# Patient Record
Sex: Female | Born: 1970 | Race: White | Hispanic: No | Marital: Single | Smoking: Current every day smoker
Health system: Southern US, Community
[De-identification: ages and names within clinical notes are randomized; demographics above are authoritative.]

## PROBLEM LIST (undated history)

## (undated) DIAGNOSIS — G4730 Sleep apnea, unspecified: Secondary | ICD-10-CM

## (undated) DIAGNOSIS — I471 Supraventricular tachycardia, unspecified: Secondary | ICD-10-CM

## (undated) DIAGNOSIS — F419 Anxiety disorder, unspecified: Secondary | ICD-10-CM

## (undated) DIAGNOSIS — K439 Ventral hernia without obstruction or gangrene: Secondary | ICD-10-CM

## (undated) DIAGNOSIS — K219 Gastro-esophageal reflux disease without esophagitis: Secondary | ICD-10-CM

## (undated) DIAGNOSIS — I499 Cardiac arrhythmia, unspecified: Secondary | ICD-10-CM

## (undated) DIAGNOSIS — Z8719 Personal history of other diseases of the digestive system: Secondary | ICD-10-CM

## (undated) DIAGNOSIS — I10 Essential (primary) hypertension: Secondary | ICD-10-CM

## (undated) HISTORY — DX: Ventral hernia without obstruction or gangrene: K43.9

## (undated) HISTORY — PX: OTHER SURGICAL HISTORY: SHX169

## (undated) HISTORY — DX: Essential (primary) hypertension: I10

## (undated) HISTORY — DX: Gastro-esophageal reflux disease without esophagitis: K21.9

## (undated) HISTORY — PX: FINGER FRACTURE SURGERY: SHX638

## (undated) HISTORY — PX: FRACTURE SURGERY: SHX138

## (undated) HISTORY — DX: Anxiety disorder, unspecified: F41.9

---

## 2014-12-14 DIAGNOSIS — Z765 Malingerer [conscious simulation]: Secondary | ICD-10-CM | POA: Insufficient documentation

## 2014-12-15 DIAGNOSIS — F4323 Adjustment disorder with mixed anxiety and depressed mood: Secondary | ICD-10-CM | POA: Insufficient documentation

## 2020-08-14 ENCOUNTER — Encounter (HOSPITAL_COMMUNITY): Payer: Self-pay | Admitting: *Deleted

## 2020-08-14 ENCOUNTER — Emergency Department (HOSPITAL_COMMUNITY)
Admission: EM | Admit: 2020-08-14 | Discharge: 2020-08-14 | Disposition: A | Discharge status: Home / Self Care | Payer: Self-pay | Attending: Emergency Medicine | Admitting: Emergency Medicine

## 2020-08-14 ENCOUNTER — Emergency Department (HOSPITAL_COMMUNITY): Payer: Self-pay

## 2020-08-14 ENCOUNTER — Other Ambulatory Visit: Payer: Self-pay

## 2020-08-14 DIAGNOSIS — E871 Hypo-osmolality and hyponatremia: Secondary | ICD-10-CM | POA: Insufficient documentation

## 2020-08-14 DIAGNOSIS — K7030 Alcoholic cirrhosis of liver without ascites: Secondary | ICD-10-CM | POA: Insufficient documentation

## 2020-08-14 DIAGNOSIS — K529 Noninfective gastroenteritis and colitis, unspecified: Secondary | ICD-10-CM | POA: Insufficient documentation

## 2020-08-14 DIAGNOSIS — F17210 Nicotine dependence, cigarettes, uncomplicated: Secondary | ICD-10-CM | POA: Insufficient documentation

## 2020-08-14 DIAGNOSIS — Y906 Blood alcohol level of 120-199 mg/100 ml: Secondary | ICD-10-CM | POA: Insufficient documentation

## 2020-08-14 DIAGNOSIS — Z79899 Other long term (current) drug therapy: Secondary | ICD-10-CM | POA: Insufficient documentation

## 2020-08-14 DIAGNOSIS — Z20822 Contact with and (suspected) exposure to covid-19: Secondary | ICD-10-CM | POA: Insufficient documentation

## 2020-08-14 HISTORY — DX: Supraventricular tachycardia: I47.1

## 2020-08-14 HISTORY — DX: Supraventricular tachycardia, unspecified: I47.10

## 2020-08-14 LAB — CBC WITH DIFFERENTIAL/PLATELET
Abs Immature Granulocytes: 0.02 10*3/uL (ref 0.00–0.07)
Basophils Absolute: 0 10*3/uL (ref 0.0–0.1)
Basophils Relative: 1 %
Eosinophils Absolute: 0.2 10*3/uL (ref 0.0–0.5)
Eosinophils Relative: 4 %
HCT: 33.9 % — ABNORMAL LOW (ref 36.0–46.0)
Hemoglobin: 12.1 g/dL (ref 12.0–15.0)
Immature Granulocytes: 0 %
Lymphocytes Relative: 19 %
Lymphs Abs: 0.9 10*3/uL (ref 0.7–4.0)
MCH: 34.2 pg — ABNORMAL HIGH (ref 26.0–34.0)
MCHC: 35.7 g/dL (ref 30.0–36.0)
MCV: 95.8 fL (ref 80.0–100.0)
Monocytes Absolute: 0.7 10*3/uL (ref 0.1–1.0)
Monocytes Relative: 13 %
Neutro Abs: 3.1 10*3/uL (ref 1.7–7.7)
Neutrophils Relative %: 63 %
Platelets: 122 10*3/uL — ABNORMAL LOW (ref 150–400)
RBC: 3.54 MIL/uL — ABNORMAL LOW (ref 3.87–5.11)
RDW: 12.8 % (ref 11.5–15.5)
WBC: 4.9 10*3/uL (ref 4.0–10.5)
nRBC: 0 % (ref 0.0–0.2)

## 2020-08-14 LAB — COMPREHENSIVE METABOLIC PANEL
ALT: 47 U/L — ABNORMAL HIGH (ref 0–44)
AST: 138 U/L — ABNORMAL HIGH (ref 15–41)
Albumin: 3.1 g/dL — ABNORMAL LOW (ref 3.5–5.0)
Alkaline Phosphatase: 80 U/L (ref 38–126)
Anion gap: 10 (ref 5–15)
BUN: <5 mg/dL — ABNORMAL LOW (ref 6–20)
CO2: 24 mmol/L (ref 22–32)
Calcium: 8 mg/dL — ABNORMAL LOW (ref 8.9–10.3)
Chloride: 89 mmol/L — ABNORMAL LOW (ref 98–111)
Creatinine, Ser: 0.3 mg/dL — ABNORMAL LOW (ref 0.44–1.00)
GFR, Estimated: >60 mL/min (ref >60)
Glucose, Bld: 89 mg/dL (ref 70–99)
Potassium: 3.3 mmol/L — ABNORMAL LOW (ref 3.5–5.1)
Sodium: 123 mmol/L — ABNORMAL LOW (ref 135–145)
Total Bilirubin: 0.8 mg/dL (ref 0.3–1.2)
Total Protein: 7.7 g/dL (ref 6.5–8.1)

## 2020-08-14 LAB — RESP PANEL BY RT-PCR (FLU A&B, COVID) ARPGX2
Influenza A by PCR: NEGATIVE
Influenza B by PCR: NEGATIVE
SARS Coronavirus 2 by RT PCR: NEGATIVE

## 2020-08-14 LAB — URINALYSIS, ROUTINE W REFLEX MICROSCOPIC
Bilirubin Urine: NEGATIVE
Glucose, UA: NEGATIVE mg/dL
Hgb urine dipstick: NEGATIVE
Ketones, ur: NEGATIVE mg/dL
Leukocytes,Ua: NEGATIVE
Nitrite: NEGATIVE
Protein, ur: NEGATIVE mg/dL
Specific Gravity, Urine: 1.003 — ABNORMAL LOW (ref 1.005–1.030)
pH: 6 (ref 5.0–8.0)

## 2020-08-14 LAB — PREGNANCY, URINE: Preg Test, Ur: NEGATIVE

## 2020-08-14 LAB — ETHANOL: Alcohol, Ethyl (B): 192 mg/dL — ABNORMAL HIGH (ref <10)

## 2020-08-14 LAB — LIPASE, BLOOD: Lipase: 72 U/L — ABNORMAL HIGH (ref 11–51)

## 2020-08-14 MED ORDER — IOHEXOL 300 MG/ML  SOLN
75.0000 mL | Freq: Once | INTRAMUSCULAR | Status: AC | PRN
  Administered 2020-08-14: 75 mL via INTRAVENOUS

## 2020-08-14 MED ORDER — ONDANSETRON HCL 4 MG/2ML IJ SOLN
4.0000 mg | INTRAMUSCULAR | Status: AC
  Administered 2020-08-14: 4 mg via INTRAVENOUS
  Filled 2020-08-14 (×2): qty 2

## 2020-08-14 MED ORDER — SODIUM CHLORIDE 0.9 % IV SOLN: INTRAVENOUS | Status: DC

## 2020-08-14 MED ORDER — IOHEXOL 9 MG/ML PO SOLN
ORAL | Status: AC
  Filled 2020-08-14: qty 1000

## 2020-08-14 MED ORDER — METRONIDAZOLE 500 MG PO TABS
500.0000 mg | ORAL_TABLET | Freq: Once | ORAL | Status: AC
  Administered 2020-08-14: 500 mg via ORAL
  Filled 2020-08-14: qty 1

## 2020-08-14 MED ORDER — MORPHINE SULFATE (PF) 4 MG/ML IV SOLN
4.0000 mg | Freq: Once | INTRAVENOUS | Status: AC
  Administered 2020-08-14: 4 mg via INTRAVENOUS
  Filled 2020-08-14 (×2): qty 1

## 2020-08-14 MED ORDER — METRONIDAZOLE 500 MG PO TABS: 500.0000 mg | ORAL_TABLET | Freq: Two times a day (BID) | ORAL | 0 refills | Status: DC

## 2020-08-14 MED ORDER — CIPROFLOXACIN HCL 500 MG PO TABS: 500.0000 mg | ORAL_TABLET | Freq: Two times a day (BID) | ORAL | 0 refills | Status: DC

## 2020-08-14 MED ORDER — THIAMINE HCL 100 MG PO TABS
100.0000 mg | ORAL_TABLET | Freq: Once | ORAL | Status: AC
  Administered 2020-08-14: 100 mg via ORAL
  Filled 2020-08-14: qty 1

## 2020-08-14 MED ORDER — CIPROFLOXACIN HCL 250 MG PO TABS
500.0000 mg | ORAL_TABLET | Freq: Once | ORAL | Status: AC
  Administered 2020-08-14: 500 mg via ORAL
  Filled 2020-08-14: qty 2

## 2020-08-14 NOTE — ED Triage Notes (Signed)
Pt with abd distention for past 7 weeks, firm and knot as well.  Poor appetite. +N/V/D x 7 weeks as well. Denies any abd pain.

## 2020-08-14 NOTE — ED Provider Notes (Signed)
[facility] EMERGENCY DEPARTMENT Provider Note   CSN: [id number] Arrival date & time: 08/14/20  1146     History Chief Complaint  Patient presents with   abd distended    Joan Wood is a 50 y.o. female.  HPI  This patient is a 50 year old female, she denies chronic medical history other than stating that she drinks alcohol approximately 1 sixpack of beer per day.  The patient reports to me that when she was in her 60s she was a heavy drinker and considers herself an alcoholic, she even checked her self involuntarily to a detox facility and rehab, she was able to stop for quite some time but started drinking again, she was sober for several years until about 3 years ago when she broke up with her boyfriend and started drinking again, she drinks approximately 6 beers per day now.  She moved here in January hoping to get a fresh start with her life, she has been doing well until about 7 weeks ago when she woke up 1 morning feeling like there was a small lump in her mid to lower abdomen.  This is in the midline, over the next 7-week she has felt fairly chronically and constantly distended with some progressive tenderness mostly on the right side of her abdomen.  She has no nausea but has been reporting some loose to watery stools which has been also pretty constant for the last 7 weeks.  She denies any weight loss, she does feel a progressive shortness of breath because she feels like her abdomen is pushing up on her chest.  No fevers no chills no swelling of the legs, no rashes, no dysuria though she does have urinary frequency urinating approximately once an hour through the night.  The symptoms are persistent and gradually worsening.  She has not seen a doctor she has no insurance she takes no daily medicines and has not been diagnosed with any chronic medical conditions otherwise.  Past Medical History:  Diagnosis Date   SVT (supraventricular tachycardia) (HCC)     There are no problems  to display for this patient.   History reviewed. No pertinent surgical history.   OB History   No obstetric history on file.     History reviewed. No pertinent family history.  Social History   Tobacco Use   Smoking status: Every Day    Packs/day: 0.50    Types: Cigarettes   Smokeless tobacco: Never  Substance Use Topics   Alcohol use: Yes    Comment: beer, 4-6 /day   Drug use: Never    Home Medications Prior to Admission medications   Medication Sig Start Date End Date Taking? Authorizing Provider  ciprofloxacin (CIPRO) 500 MG tablet Take 1 tablet (500 mg total) by mouth 2 (two) times daily. 08/14/20  Yes Eber Hong, MD  metroNIDAZOLE (FLAGYL) 500 MG tablet Take 1 tablet (500 mg total) by mouth 2 (two) times daily. 08/14/20  Yes Eber Hong, MD    Allergies    Bee venom  Review of Systems   Review of Systems  All other systems reviewed and are negative.  Physical Exam Updated Vital Signs BP 113/80 (BP Location: Left Arm)   Pulse 90   Temp 99.1 F (37.3 C) (Oral)   Resp 20   Ht 1.549 m (5\' 1" )   Wt 50.8 kg   SpO2 100%   BMI 21.16 kg/m   Physical Exam Vitals and nursing note reviewed.  Constitutional:  General: She is not in acute distress.    Appearance: She is well-developed.  HENT:     Head: Normocephalic and atraumatic.     Mouth/Throat:     Mouth: Mucous membranes are moist.     Pharynx: No oropharyngeal exudate.  Eyes:     General: No scleral icterus.       Right eye: No discharge.        Left eye: No discharge.     Conjunctiva/sclera: Conjunctivae normal.     Pupils: Pupils are equal, round, and reactive to light.     Comments: No jaundice  Neck:     Thyroid: No thyromegaly.     Vascular: No JVD.  Cardiovascular:     Rate and Rhythm: Normal rate and regular rhythm.     Heart sounds: Normal heart sounds. No murmur heard.   No friction rub. No gallop.  Pulmonary:     Effort: Pulmonary effort is normal. No respiratory distress.      Breath sounds: Normal breath sounds. No wheezing or rales.  Abdominal:     General: Bowel sounds are normal. There is distension.     Palpations: Abdomen is soft. There is no mass.     Tenderness: There is abdominal tenderness.     Comments: The right side of the abdomen feels full and somewhat solid especially in the mid abdomen, she has tenderness on the side, she has no tenderness or masses in the left side of the abdomen.  She has what appears to be a small periumbilical hernia that is worse when she bears down but easily reducible  Musculoskeletal:        General: No tenderness. Normal range of motion.     Cervical back: Normal range of motion and neck supple.  Lymphadenopathy:     Cervical: No cervical adenopathy.  Skin:    General: Skin is warm and dry.     Findings: No erythema or rash.  Neurological:     Mental Status: She is alert.     Coordination: Coordination normal.  Psychiatric:        Behavior: Behavior normal.    ED Results / Procedures / Treatments   Labs (all labs ordered are listed, but only abnormal results are displayed) Labs Reviewed  CBC WITH DIFFERENTIAL/PLATELET - Abnormal; Notable for the following components:      Result Value   RBC 3.54 (*)    HCT 33.9 (*)    MCH 34.2 (*)    Platelets 122 (*)    All other components within normal limits  COMPREHENSIVE METABOLIC PANEL - Abnormal; Notable for the following components:   Sodium 123 (*)    Potassium 3.3 (*)    Chloride 89 (*)    BUN <5 (*)    Creatinine, Ser 0.30 (*)    Calcium 8.0 (*)    Albumin 3.1 (*)    AST 138 (*)    ALT 47 (*)    All other components within normal limits  LIPASE, BLOOD - Abnormal; Notable for the following components:   Lipase 72 (*)    All other components within normal limits  ETHANOL - Abnormal; Notable for the following components:   Alcohol, Ethyl (B) 192 (*)    All other components within normal limits  URINALYSIS, ROUTINE W REFLEX MICROSCOPIC - Abnormal; Notable  for the following components:   APPearance HAZY (*)    Specific Gravity, Urine 1.003 (*)    All other components within normal limits  RESP PANEL BY RT-PCR (FLU A&B, COVID) ARPGX2  PREGNANCY, URINE    EKG None  Radiology CT ABDOMEN PELVIS W CONTRAST  Result Date: 08/14/2020 CLINICAL DATA:  Abdominal pain, acute, nonlocalized. Abdominal distension, nausea, vomiting, diarrhea. EXAM: CT ABDOMEN AND PELVIS WITH CONTRAST TECHNIQUE: Multidetector CT imaging of the abdomen and pelvis was performed using the standard protocol following bolus administration of intravenous contrast. CONTRAST:  75mL OMNIPAQUE IOHEXOL 300 MG/ML  SOLN COMPARISON:  None. FINDINGS: Lower chest: No acute abnormality. Hepatobiliary: Moderate hepatic steatosis. Mild hepatomegaly with the liver measuring 23 cm in craniocaudal dimension. Ill-defined 17 mm low-attenuation lesion within the right hepatic lobe demonstrates progressive fill in on delayed phase images and, while nonspecific, likely represents a small hemangioma in a patient without a history of malignancy. The liver is otherwise unremarkable. Gallbladder unremarkable. No intra or extrahepatic biliary ductal dilation. Pancreas: Unremarkable Spleen: Unremarkable Adrenals/Urinary Tract: The adrenal glands are unremarkable. The kidneys are normal. There is mild distention of the bladder which is otherwise unremarkable. Stomach/Bowel: There is mucosal hyperemia, marked circumferential bowel wall thickening and extensive mesenteric edema involving a single loop of a mid jejunum within the right lower quadrant of the abdomen in keeping with a focal infectious or inflammatory enteritis. There is no evidence of obstruction or perforation. The stomach, small bowel, and large bowel are otherwise unremarkable. The appendix is well visualized and is normal. There is no free intraperitoneal gas. Trace free fluid within the pelvis. Vascular/Lymphatic: Mild atherosclerotic calcification within  the abdominal aorta. No aortic aneurysm. Specifically, the superior mesenteric artery and vein are patent centrally. Reproductive: Uterus and bilateral adnexa are unremarkable. Other: There is recanalization of the umbilical vein suggesting changes of portal venous hypertension. The rectum is unremarkable. Musculoskeletal: No acute bone abnormality. No lytic or blastic bone lesion identified. IMPRESSION: Extensive inflammatory change involving a single loop of small bowel within the right lower quadrant in keeping with a focal infectious or inflammatory enteritis. No evidence of obstruction or perforation. Moderate hepatic steatosis and mild hepatomegaly. Recanalization of the umbilical vein suggest changes of portal venous hypertension. Correlation with liver enzymes is recommended. Hepatic elastography may pre palpable for further management. Indeterminate low-attenuation lesion within the right hepatic lobe. Comparison with prior examinations, if available, would be helpful in documenting stability and confirming an underlying benign etiology. If none are available, this could be better assessed with dedicated MRI examination. Aortic Atherosclerosis (ICD10-I70.0). Electronically Signed   By: Helyn NumbersAshesh  Parikh MD   On: 08/14/2020 15:24    Procedures Procedures   Medications Ordered in ED Medications  0.9 %  sodium chloride infusion ( Intravenous New Bag/Given 08/14/20 1244)  iohexol (OMNIPAQUE) 9 MG/ML oral solution (has no administration in time range)  0.9 %  sodium chloride infusion (has no administration in time range)  ciprofloxacin (CIPRO) tablet 500 mg (has no administration in time range)  metroNIDAZOLE (FLAGYL) tablet 500 mg (has no administration in time range)  morphine 4 MG/ML injection 4 mg (4 mg Intravenous Given 08/14/20 1244)  ondansetron (ZOFRAN) injection 4 mg (4 mg Intravenous Given 08/14/20 1242)  iohexol (OMNIPAQUE) 300 MG/ML solution 75 mL (75 mLs Intravenous Contrast Given 08/14/20 1434)   thiamine tablet 100 mg (100 mg Oral Given 08/14/20 1454)    ED Course  I have reviewed the triage vital signs and the nursing notes.  Pertinent labs & imaging results that were available during my care of the patient were reviewed by me and considered in my medical decision making (see chart  for details).    MDM Rules/Calculators/A&P                           Given the patient's alcohol history I suspect that there could be cirrhosis, would consider cancer as there does appear to be a mass, CT scan will need to be ordered to evaluate.  It seems to be more than just guarding on the right side of her abdomen but possibly a solid masslike structure.  This would explain distention, possible ascites, she does not have a fluid wave and does not appear distended, she is not objectively hypoxic or tachypneic.  She is agreeable to the work-up including labs and a CT scan as well as a urinalysis.  Vital signs are unremarkable  The patient CT scan shows that she has hepatomegaly, she has some steatosis and a lesion in the liver that needs an MRI for further characterization, this can be done as an outpatient.  It also shows some inflammatory changes around the loop of jejunum, she will need Cipro and Flagyl which was ordered here, she can follow-up in the outpatient setting for this as well.  Unfortunately it also shows that she has some hyponatremia.  I have made the patient aware of this finding and in fact I have given her the exact instructions on how to care for it.  She wants to go home, she is ambulatory and not dizzy at all.  She is not nauseated, she is not lightheaded, she is not hypotensive and would rather treat this at home.  She is aware that she must have this level rechecked within the week even if she comes back to the emergency department and she is agreeable.  No urinary tract infection  Final Clinical Impression(s) / ED Diagnoses Final diagnoses:  Hyponatremia  Enteritis  Alcoholic  cirrhosis of liver without ascites (HCC)    Rx / DC Orders ED Discharge Orders          Ordered    ciprofloxacin (CIPRO) 500 MG tablet  2 times daily        08/14/20 1536    metroNIDAZOLE (FLAGYL) 500 MG tablet  2 times daily        08/14/20 1536             Eber Hong, MD 08/14/20 1540

## 2020-08-14 NOTE — Discharge Instructions (Addendum)
Your CT scan showed a couple of different things  I have given you a copy of the CT scan, you need to follow-up with a family doctor, see the list below for local family doctor  You must stop drinking as this will cause continual damage to your liver  The CT scan showed that you had some inflammation around part of your small bowel, this is likely why you are having diarrhea.  You should take Cipro and Flagyl twice a day for the next 7 days as prescribed to help with The CT scan also showed that you have a fatty liver and likely some cirrhosis, this occurs when you drink too much alcohol, you must stop drinking immediately to help this heal The CT scan also showed that there is a small spot in one of the lobes of the liver, you may need to have an MRI follow-up, please share this result with your family doctor and have them order an MRI as an outpatient   The most important finding from today's testing showed that your sodium or your salt level was low.  You will need to make sure that you are stopping your alcohol consumption and taking in more salt, please add some extra salt to your meals for the next week and have your level of sodium rechecked within the week.  Even if he has to come back to the emergency department you should have it rechecked.  For any severe or worsening symptoms return to the emergency department immediately.  Thank you for letting us take care of you today!  Please obtain all of your results from medical records or have your doctors office obtain the results - share them with your doctor - you should be seen at your doctors office in the next 2 days. Call today to arrange your follow up. Take the medications as prescribed. Please review all of the medicines and only take them if you do not have an allergy to them. Please be aware that if you are taking birth control pills, taking other prescriptions, ESPECIALLY ANTIBIOTICS may make the birth control ineffective - if this  is the case, either do not engage in sexual activity or use alternative methods of birth control such as condoms until you have finished the medicine and your family doctor says it is OK to restart them. If you are on a blood thinner such as COUMADIN, be aware that any other medicine that you take may cause the coumadin to either work too much, or not enough - you should have your coumadin level rechecked in next 7 days if this is the case.  ?  It is also a possibility that you have an allergic reaction to any of the medicines that you have been prescribed - Everybody reacts differently to medications and while MOST people have no trouble with most medicines, you may have a reaction such as nausea, vomiting, rash, swelling, shortness of breath. If this is the case, please stop taking the medicine immediately and contact your physician.   If you were given a medication in the ED such as percocet, vicodin, or morphine, be aware that these medicines are sedating and may change your ability to take care of yourself adequately for several hours after being given this medicines - you should not drive or take care of small children if you were given this medicine in the Emergency Department or if you have been prescribed these types of medicines. ?   You should return  to the ER IMMEDIATELY if you develop severe or worsening symptoms.   [facility] Primary Care Doctor List    Kari Baars MD. Specialty: Pulmonary Disease Contact information: [address]  [address]  [name] [address]  [phone]   Syliva Overman, MD. Specialty: [facility] Medicine Contact information: [address]  [name] [address]  [phone]   Lilyan Punt, MD. Specialty: [facility] Medicine Contact information: [address]  [name] [address]  [phone]   Avon Gully, MD Specialty: Internal Medicine Contact information: [address] [name] [address]  [phone]    Catalina Pizza, MD. Specialty: Internal Medicine Contact information: [address]  [name] [address]  [phone]    [facility] (Dr. Selena Batten) Specialty: Family Medicine Contact information: [address]  [name] [address]  [phone]   John Giovanni, MD. Specialty: [facility] Medicine Contact information: [address]  [address]  [name] [address]  [phone]   Carylon Perches, MD. Specialty: Internal Medicine Contact information: [address]  [address]  [name] [address]  [phone]    [facility]  [address] [place], [name] [address] [phone]  Services [facility] offers a variety of basic health services.  Services include but are not limited to: Blood pressure checks  Heart rate checks  Blood sugar checks  Urine analysis  Rapid strep tests  Pregnancy tests.  Health education and referrals  People needing more complex services will be directed to a physician online. Using these virtual visits, doctors can evaluate and prescribe medicine and treatments. There will be no medication on-site, though [facility] will help patients fill their prescriptions at little to no cost.   For More information please go to: [email or web address]

## 2021-09-24 IMAGING — CT CT ABD-PELV W/ CM
2 of 5 series · 15 of 46 positions shown, 17 images · IV contrast (Omnipaque or Isovue)
Comparison: None.

CLINICAL DATA: Abdominal pain, acute, nonlocalized. Abdominal
distension, nausea, vomiting, diarrhea.

EXAM:
CT ABDOMEN AND PELVIS WITH CONTRAST
TECHNIQUE: Multidetector CT imaging of the abdomen and pelvis was performed
using the standard protocol following bolus administration of
intravenous contrast.
CONTRAST:  75mL OMNIPAQUE IOHEXOL 300 MG/ML  SOLN

[Series 2: axial st · axial · 0.76mm/px · z∈[+609,+1009]mm · 12 of 92 slices shown, 14 images]
[im 6/92  soft-tissue]
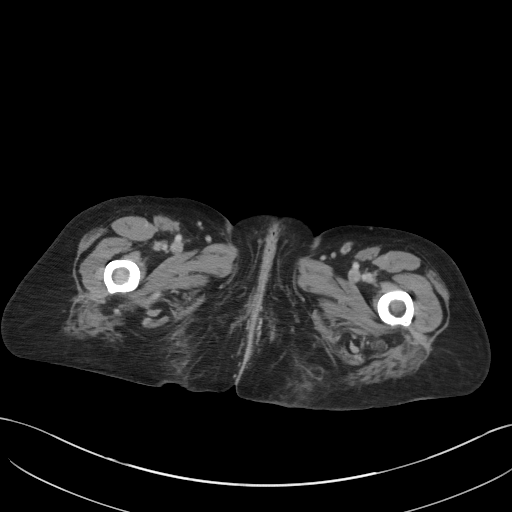
[im 6/92  bone]
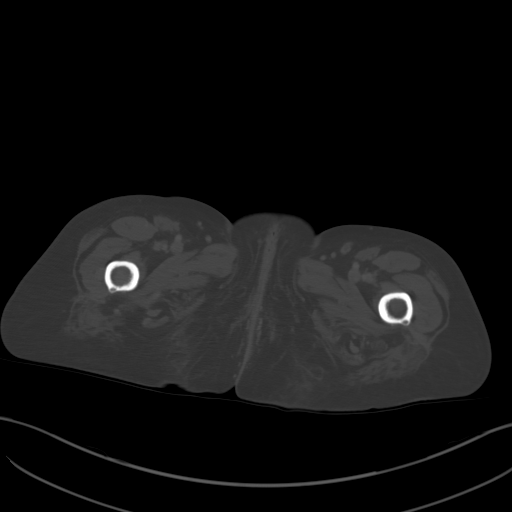
[im 17/92  soft-tissue]
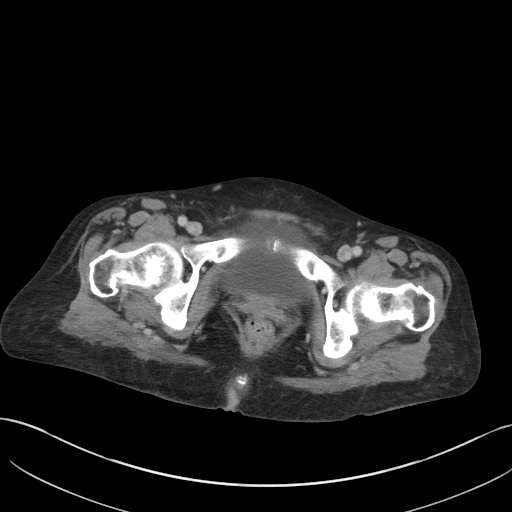
[im 22/92  soft-tissue]
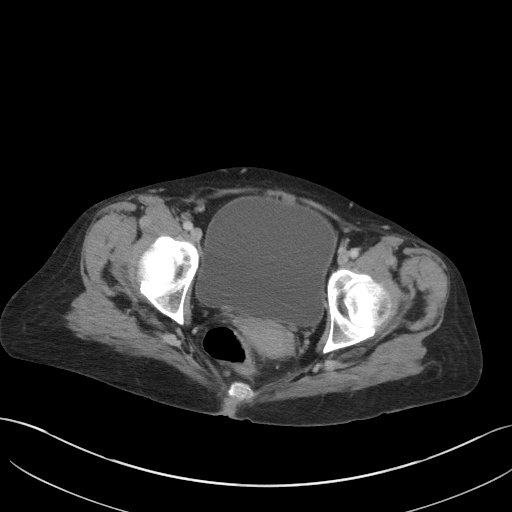
[im 27/92  soft-tissue]
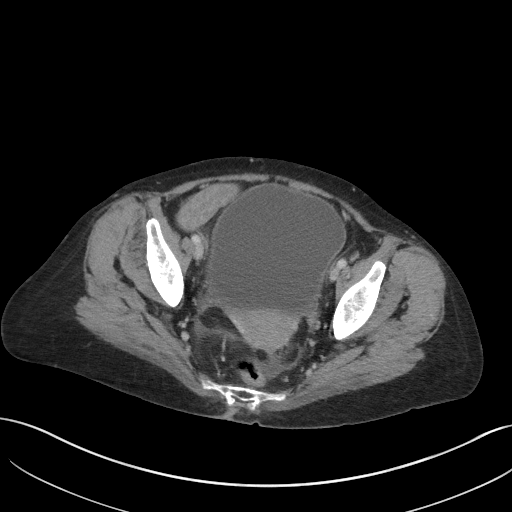
[im 38/92  soft-tissue]
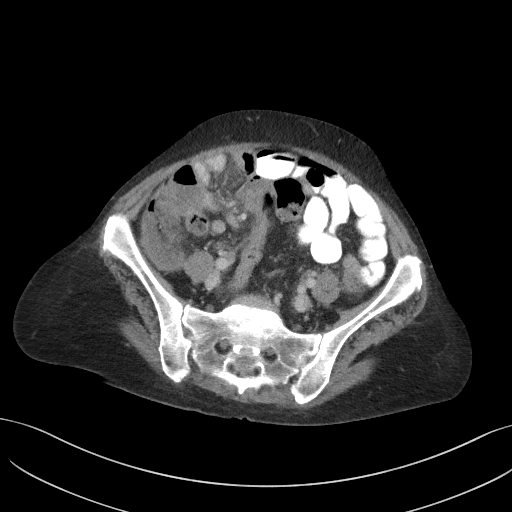
[im 43/92  soft-tissue]
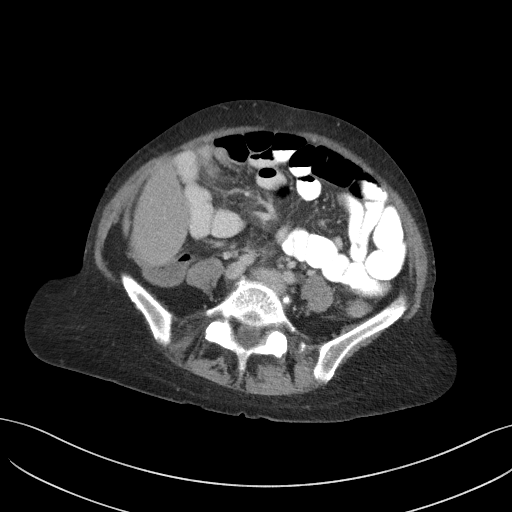
[im 49/92  soft-tissue]
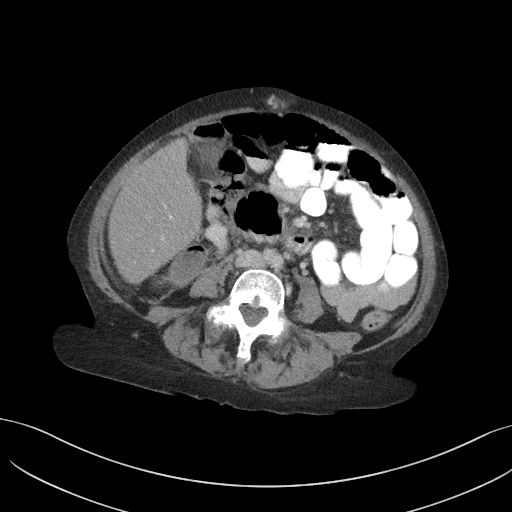
[im 59/92  soft-tissue]
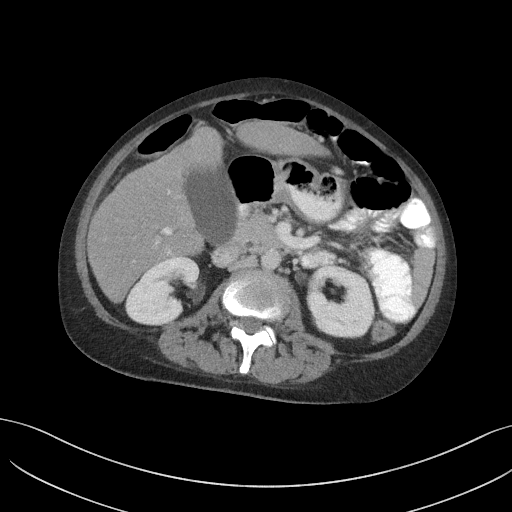
[im 65/92  soft-tissue]
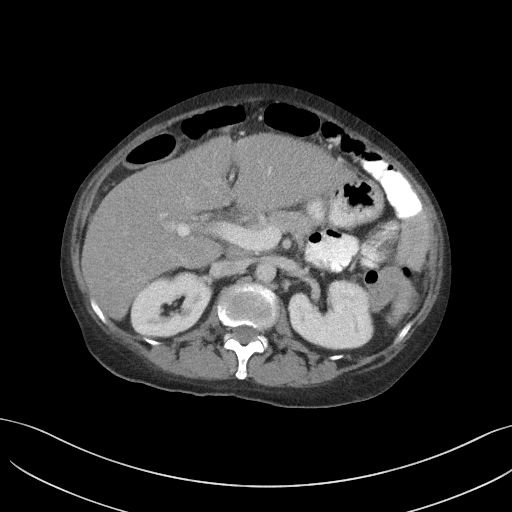
[im 65/92  bone]
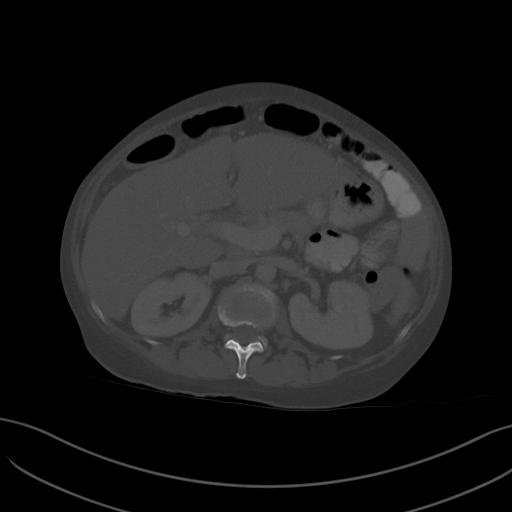
[im 70/92  soft-tissue]
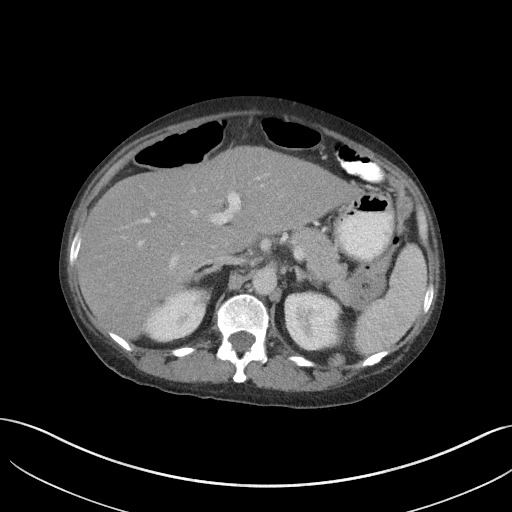
[im 81/92  soft-tissue]
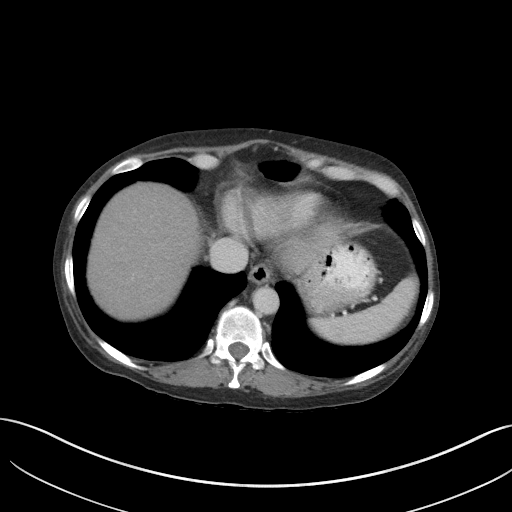
[im 86/92  soft-tissue]
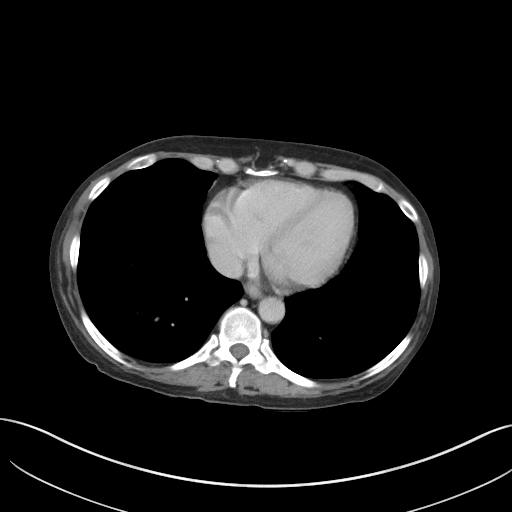

[Series 5: coronal st · coronal · 0.74mm/px · 3 of 94 slices shown]
[im 32/94  soft-tissue]
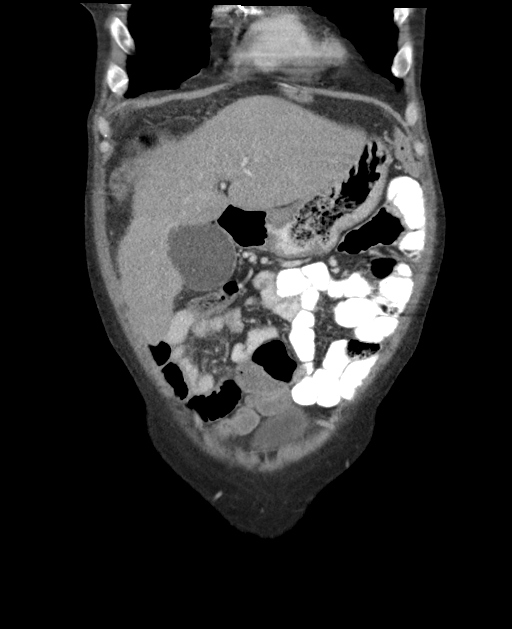
[im 42/94  soft-tissue]
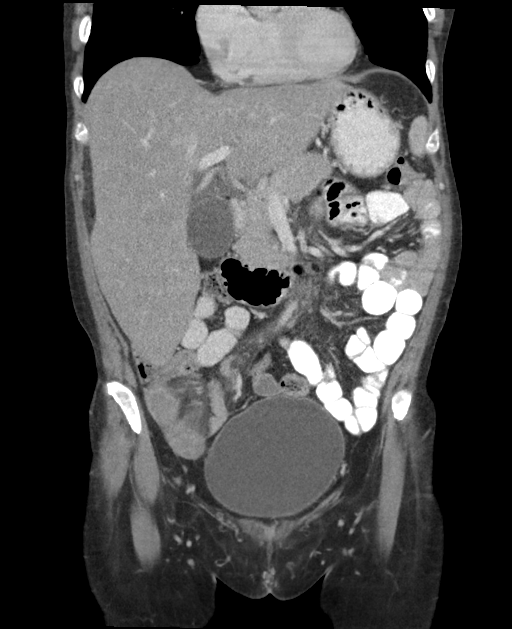
[im 52/94  soft-tissue]
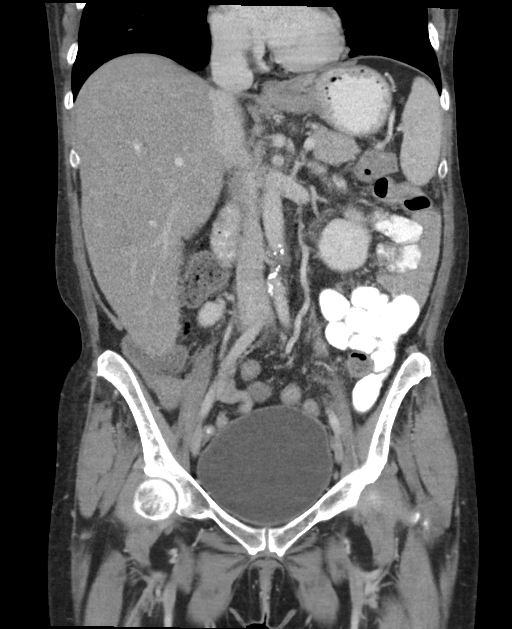

[15 of 46 positions shown; findings below may reference images not displayed]

FINDINGS: Lower chest: No acute abnormality.

Hepatobiliary: Moderate hepatic steatosis. Mild hepatomegaly with
the liver measuring 23 cm in craniocaudal dimension. Ill-defined 17
mm low-attenuation lesion within the right hepatic lobe demonstrates
progressive fill in on delayed phase images and, while nonspecific,
likely represents a small hemangioma in a patient without a history
of malignancy. The liver is otherwise unremarkable. Gallbladder
unremarkable. No intra or extrahepatic biliary ductal dilation.

Pancreas: Unremarkable

Spleen: Unremarkable

Adrenals/Urinary Tract: The adrenal glands are unremarkable. The
kidneys are normal. There is mild distention of the bladder which is
otherwise unremarkable.

Stomach/Bowel: There is mucosal hyperemia, marked circumferential
bowel wall thickening and extensive mesenteric edema involving a
single loop of a mid jejunum within the right lower quadrant of the
abdomen in keeping with a focal infectious or inflammatory
enteritis. There is no evidence of obstruction or perforation. The
stomach, small bowel, and large bowel are otherwise unremarkable.
The appendix is well visualized and is normal. There is no free
intraperitoneal gas. Trace free fluid within the pelvis.

Vascular/Lymphatic: Mild atherosclerotic calcification within the
abdominal aorta. No aortic aneurysm. Specifically, the superior
mesenteric artery and vein are patent centrally.

Reproductive: Uterus and bilateral adnexa are unremarkable.

Other: There is recanalization of the umbilical vein suggesting
changes of portal venous hypertension. The rectum is unremarkable.

Musculoskeletal: No acute bone abnormality. No lytic or blastic bone
lesion identified.
IMPRESSION: Extensive inflammatory change involving a single loop of small bowel
within the right lower quadrant in keeping with a focal infectious
or inflammatory enteritis. No evidence of obstruction or
perforation.

Moderate hepatic steatosis and mild hepatomegaly. Recanalization of
the umbilical vein suggest changes of portal venous hypertension.
Correlation with liver enzymes is recommended. Hepatic elastography
may pre palpable for further management.

Indeterminate low-attenuation lesion within the right hepatic lobe.
Comparison with prior examinations, if available, would be helpful
in documenting stability and confirming an underlying benign
etiology. If none are available, this could be better assessed with
dedicated MRI examination.

Aortic Atherosclerosis ([id number]-[id number].Z).

## 2022-03-24 ENCOUNTER — Ambulatory Visit: Payer: Self-pay

## 2022-03-24 NOTE — Telephone Encounter (Signed)
      Chief Complaint: Left hip pain Symptoms: Pain Frequency: 1 year ago Pertinent Negatives: Patient denies weakness Disposition: [x] ED /[] Urgent Care (no appt availability in office) / [] Appointment(In office/virtual)/ []  [facility]/ [] Home Care/ [] Refused Recommended Disposition /[] [facility]/ []  Follow-up with PCP Additional Notes: Refuses UC/ED.   Reason for Disposition  Patient sounds very sick or weak to the triager  Answer Assessment - Initial Assessment Questions 1. LOCATION and RADIATION: "Where is the pain located?"      Left hip 2. QUALITY: "What does the pain feel like?"  (e.g., sharp, dull, aching, burning)     Sharp pain 3. SEVERITY: "How bad is the pain?" "What does it keep you from doing?"   (Scale 1-10; or mild, moderate, severe)   -  MILD (1-3): doesn't interfere with normal activities    -  MODERATE (4-7): interferes with normal activities (e.g., work or school) or awakens from sleep, limping    -  SEVERE (8-10): excruciating pain, unable to do any normal activities, unable to walk     Now - 7-8 4. ONSET: "When did the pain start?" "Does it come and go, or is it there all the time?"     1 year ago 5. WORK OR EXERCISE: "Has there been any recent work or exercise that involved this part of the body?"      No 6. CAUSE: "What do you think is causing the hip pain?"      Unsure 7. AGGRAVATING FACTORS: "What makes the hip pain worse?" (e.g., walking, climbing stairs, running)     Walking 8. OTHER SYMPTOMS: "Do you have any other symptoms?" (e.g., back pain, pain shooting down leg,  fever, rash)     No  Protocols used: Hip Pain-A-AH

## 2022-03-28 ENCOUNTER — Encounter: Payer: Self-pay | Admitting: Internal Medicine

## 2022-03-28 ENCOUNTER — Ambulatory Visit: Payer: Medicaid Other | Admitting: Internal Medicine

## 2022-03-28 ENCOUNTER — Ambulatory Visit (HOSPITAL_COMMUNITY)
Admission: RE | Admit: 2022-03-28 | Discharge: 2022-03-28 | Disposition: A | Discharge status: Home / Self Care | Payer: Medicaid Other | Source: Ambulatory Visit | Attending: Internal Medicine | Admitting: Internal Medicine

## 2022-03-28 VITALS — BP 138/80 | HR 100 | Resp 16 | Ht 62.5 in | Wt 121.0 lb

## 2022-03-28 DIAGNOSIS — K420 Umbilical hernia with obstruction, without gangrene: Secondary | ICD-10-CM | POA: Insufficient documentation

## 2022-03-28 DIAGNOSIS — F1090 Alcohol use, unspecified, uncomplicated: Secondary | ICD-10-CM | POA: Insufficient documentation

## 2022-03-28 DIAGNOSIS — M25552 Pain in left hip: Secondary | ICD-10-CM | POA: Insufficient documentation

## 2022-03-28 DIAGNOSIS — K439 Ventral hernia without obstruction or gangrene: Secondary | ICD-10-CM | POA: Diagnosis not present

## 2022-03-28 DIAGNOSIS — Z0001 Encounter for general adult medical examination with abnormal findings: Secondary | ICD-10-CM

## 2022-03-28 DIAGNOSIS — E559 Vitamin D deficiency, unspecified: Secondary | ICD-10-CM | POA: Diagnosis not present

## 2022-03-28 DIAGNOSIS — K429 Umbilical hernia without obstruction or gangrene: Secondary | ICD-10-CM | POA: Insufficient documentation

## 2022-03-28 MED ORDER — MELOXICAM 7.5 MG PO TABS: 7.5000 mg | ORAL_TABLET | Freq: Every day | ORAL | 0 refills | Status: DC

## 2022-03-28 NOTE — Assessment & Plan Note (Signed)
Patient has had history of the periumbilical hernia.  Noted on CT abdomen pelvis at [facility] 09/04/2020. Patient is having loose bowel movements. No constipation or pain at hernia.  She would like to see surgery to consider repair.   Referral to general surgery

## 2022-03-28 NOTE — Assessment & Plan Note (Addendum)
Left Hip Pain: Started hurting around 6 months ago. Patient did not have a fall or any trauma to hip. Pain radiates down to knee. She has tried ibuprofen and tylenol. She is having difficulty walking and climbing steps is very painful. No other joint pains. Also has hernia, I do not think this is related at this time base on exam findings.   Exam showed pain in posterior hip  Pain generated with many PE maneuvers.Will send for xray to rule out fracture.  -Meloxicam for pain - Further recommendations after xray

## 2022-03-28 NOTE — Assessment & Plan Note (Addendum)
Patient drinks 4-6 beers every day.  Reports is not causing her any problems with her daily activities and no history of withdrawal.   -Recommended cutting back on drinking alcohol -Reviewed imaging after visit, and patient had CT abdomen and pelvic in 2022 suggesting cirrhotic liver morphology.  - Obtaining baseline labs today and patient has follow up in a month. Will need to discuss further evaluation and abstinence from alcohol

## 2022-03-28 NOTE — Patient Instructions (Signed)
Thank you, [name] for allowing [place] to provide your care today.   I have ordered the following labs for you:   Lab Orders         Lipid panel         CMP14+EGFR         CBC with Differential/Platelet         TSH         Hemoglobin A1c         VITAMIN D 25 Hydroxy (Vit-D Deficiency, Fractures)        Referrals ordered today:    Referral Orders         Ambulatory referral to General Surgery       Reminders: Go to [facility] to have LEFT hip xray completed.     Tamsen Snider, M.D.

## 2022-03-28 NOTE — Progress Notes (Signed)
HPI:[name] is a 52 y.o. female who has not following regularly with a PCP and is not currently treated for any chronic medical conditions who presents to establish care and has acute concerns of left hip pain and periumbilical hernia.   Past Medical History:  Diagnosis Date   GERD (gastroesophageal reflux disease)    Hernia of abdominal wall    HTN (hypertension)    SVT (supraventricular tachycardia)     History reviewed. No pertinent surgical history.  Family History  Problem Relation Age of Onset   Cancer Mother    Leukemia Mother    Multiple myeloma Mother    Cancer Father     Social History   Tobacco Use   Smoking status: Every Day    Packs/day: 0.75    Years: 37.00    Additional pack years: 0.00    Total pack years: 27.75    Types: Cigarettes   Smokeless tobacco: Never  Substance Use Topics   Alcohol use: Yes    Comment: beer, 4-6 /day   Drug use: Never    Physical Exam: Vitals:   03/28/22 0900 03/28/22 0938  BP: (!) 170/103 138/80  Pulse: 100   Resp: 16   SpO2: 97%   Weight: 121 lb (54.9 kg)   Height: 5' 2.5" (1.588 m)      Physical Exam Constitutional:      Appearance: She is normal weight.     Comments: Antalgic gait. Needs help to get on exam table due to left hip pain  Cardiovascular:     Rate and Rhythm: Normal rate and regular rhythm.  Pulmonary:     Effort: Pulmonary effort is normal. No respiratory distress.     Breath sounds: No wheezing.  Abdominal:     General: Bowel sounds are normal.     Palpations: Abdomen is soft.     Hernia: A hernia (periumbilical) is present.  Musculoskeletal:        General: No swelling or deformity.     Comments: Left Hip No deformity. Pain with light palpation in focal spot in posterior hip Normal ROM, pain produced NVI distally. Pain produced with logroll , faber and fadir testing        Assessment & Plan:   Alcohol use disorder Patient drinks 4-6 beers every day.  Reports is  not causing her any problems with her daily activities and no history of withdrawal.   -Recommended cutting back on drinking alcohol -Reviewed imaging after visit, and patient had CT abdomen and pelvic in 2022 suggesting cirrhotic liver morphology.  - Obtaining baseline labs today and patient has follow up in a month. Will need to discuss further evaluation and abstinence from alcohol  underlying cirrhotic liver morphology.   Left hip pain Left Hip Pain: Started hurting around 6 months ago. Patient did not have a fall or any trauma to hip. Pain radiates down to knee. She has tried ibuprofen and tylenol. She is having difficulty walking and climbing steps is very painful. No other joint pains. Also has hernia, I do not think this is related at this time base on exam findings.   Exam showed pain in posterior hip  Pain generated with many PE maneuvers.Will send for xray to rule out fracture.  -Meloxicam for pain - Further recommendations after xray  Periumbilical hernia Patient has had history of the periumbilical hernia.  Noted on CT abdomen pelvis at [facility] 09/04/2020. Patient is having loose bowel movements. No constipation or  pain at hernia.  She would like to see surgery to consider repair.   Referral to general surgery    Lorene Dy, MD

## 2022-03-29 ENCOUNTER — Other Ambulatory Visit: Payer: Self-pay | Admitting: Internal Medicine

## 2022-03-29 DIAGNOSIS — E559 Vitamin D deficiency, unspecified: Secondary | ICD-10-CM

## 2022-03-29 LAB — CMP14+EGFR
ALT: 28 IU/L (ref 0–32)
AST: 77 IU/L — ABNORMAL HIGH (ref 0–40)
Albumin/Globulin Ratio: 1.2 (ref 1.2–2.2)
Albumin: 4.5 g/dL (ref 3.8–4.9)
Alkaline Phosphatase: 104 IU/L (ref 44–121)
BUN/Creatinine Ratio: 13 (ref 9–23)
BUN: 7 mg/dL (ref 6–24)
Bilirubin Total: 0.5 mg/dL (ref 0.0–1.2)
CO2: 21 mmol/L (ref 20–29)
Calcium: 9.9 mg/dL (ref 8.7–10.2)
Chloride: 94 mmol/L — ABNORMAL LOW (ref 96–106)
Creatinine, Ser: 0.55 mg/dL — ABNORMAL LOW (ref 0.57–1.00)
Globulin, Total: 3.9 g/dL (ref 1.5–4.5)
Glucose: 79 mg/dL (ref 70–99)
Potassium: 4.8 mmol/L (ref 3.5–5.2)
Sodium: 133 mmol/L — ABNORMAL LOW (ref 134–144)
Total Protein: 8.4 g/dL (ref 6.0–8.5)
eGFR: 110 mL/min/{1.73_m2} (ref >59)

## 2022-03-29 LAB — CBC WITH DIFFERENTIAL/PLATELET
Basophils Absolute: 0 10*3/uL (ref 0.0–0.2)
Basos: 1 %
EOS (ABSOLUTE): 0.1 10*3/uL (ref 0.0–0.4)
Eos: 3 %
Hematocrit: 39 % (ref 34.0–46.6)
Hemoglobin: 13.8 g/dL (ref 11.1–15.9)
Immature Grans (Abs): 0 10*3/uL (ref 0.0–0.1)
Immature Granulocytes: 0 %
Lymphocytes Absolute: 2 10*3/uL (ref 0.7–3.1)
Lymphs: 37 %
MCH: 33.7 pg — ABNORMAL HIGH (ref 26.6–33.0)
MCHC: 35.4 g/dL (ref 31.5–35.7)
MCV: 95 fL (ref 79–97)
Monocytes Absolute: 0.6 10*3/uL (ref 0.1–0.9)
Monocytes: 12 %
Neutrophils Absolute: 2.6 10*3/uL (ref 1.4–7.0)
Neutrophils: 47 %
Platelets: 142 10*3/uL — ABNORMAL LOW (ref 150–450)
RBC: 4.1 x10E6/uL (ref 3.77–5.28)
RDW: 11.7 % (ref 11.7–15.4)
WBC: 5.4 10*3/uL (ref 3.4–10.8)

## 2022-03-29 LAB — LIPID PANEL
Chol/HDL Ratio: 2 ratio (ref 0.0–4.4)
Cholesterol, Total: 206 mg/dL — ABNORMAL HIGH (ref 100–199)
HDL: 103 mg/dL (ref >39)
LDL Chol Calc (NIH): 86 mg/dL (ref 0–99)
Triglycerides: 99 mg/dL (ref 0–149)
VLDL Cholesterol Cal: 17 mg/dL (ref 5–40)

## 2022-03-29 LAB — TSH: TSH: 3.47 u[IU]/mL (ref 0.450–4.500)

## 2022-03-29 LAB — HEMOGLOBIN A1C
Est. average glucose Bld gHb Est-mCnc: 94 mg/dL
Hgb A1c MFr Bld: 4.9 % (ref 4.8–5.6)

## 2022-03-29 LAB — VITAMIN D 25 HYDROXY (VIT D DEFICIENCY, FRACTURES): Vit D, 25-Hydroxy: 5.1 ng/mL — ABNORMAL LOW (ref 30.0–100.0)

## 2022-03-29 MED ORDER — CHOLECALCIFEROL 1.25 MG (50000 UT) PO CAPS: 50000.0000 [IU] | ORAL_CAPSULE | ORAL | 0 refills | Status: DC

## 2022-03-29 MED ORDER — VITAMIN D3 25 MCG (1000 UT) PO CAPS: 1000.0000 [IU] | ORAL_CAPSULE | Freq: Every day | ORAL | 0 refills | Status: DC

## 2022-03-29 NOTE — Progress Notes (Signed)
  Patient will take 50,000 units vitamin D3 weekly for 8 weeks, then take 1000 units vitamin D3 daily.

## 2022-03-30 ENCOUNTER — Telehealth: Payer: Self-pay | Admitting: Family Medicine

## 2022-03-30 NOTE — Telephone Encounter (Signed)
Pt called in regard to nausea. Pt states that she fought for hours to not vomit today, is very nauseous. Wants to know I provider can send in promethazine.  Wants a call back.  Last appt  3/19 w. [facility]

## 2022-03-31 ENCOUNTER — Other Ambulatory Visit: Payer: Self-pay | Admitting: Family Medicine

## 2022-03-31 DIAGNOSIS — R112 Nausea with vomiting, unspecified: Secondary | ICD-10-CM

## 2022-03-31 MED ORDER — ONDANSETRON HCL 4 MG PO TABS: 4.0000 mg | ORAL_TABLET | Freq: Three times a day (TID) | ORAL | 0 refills | Status: DC | PRN

## 2022-03-31 NOTE — Telephone Encounter (Signed)
Lvm

## 2022-03-31 NOTE — Telephone Encounter (Signed)
Zofran sent to the pharmacy

## 2022-04-12 ENCOUNTER — Ambulatory Visit: Payer: Medicaid Other | Admitting: Family Medicine

## 2022-04-18 ENCOUNTER — Encounter: Payer: Self-pay | Admitting: Surgery

## 2022-04-18 ENCOUNTER — Ambulatory Visit: Payer: Medicaid Other | Admitting: Surgery

## 2022-04-18 ENCOUNTER — Other Ambulatory Visit: Payer: Self-pay | Admitting: *Deleted

## 2022-04-18 VITALS — BP 154/97 | HR 96 | Temp 98.0°F | Resp 14 | Ht 62.5 in | Wt 122.0 lb

## 2022-04-18 DIAGNOSIS — F1090 Alcohol use, unspecified, uncomplicated: Secondary | ICD-10-CM | POA: Diagnosis not present

## 2022-04-18 DIAGNOSIS — K429 Umbilical hernia without obstruction or gangrene: Secondary | ICD-10-CM

## 2022-04-18 DIAGNOSIS — K769 Liver disease, unspecified: Secondary | ICD-10-CM

## 2022-04-18 NOTE — Patient Instructions (Signed)
The GI office will call you to schedule an appointment for evaluation of your liver disease

## 2022-04-21 NOTE — Progress Notes (Signed)
[facility] History and Physical  Reason for Referral: Periumbilical hernia Referring Physician: Dr. Barbaraann Faster  Chief Complaint   New Patient (Initial Visit)     Joan Wood is a 52 y.o. female.  HPI: Patient presents for evaluation of her umbilical hernia.  It has been present for 2 years.  She is never really tried to reduce it and it is tender to palpation.  She feels that the hernia has been getting bigger.  She does describe some difficulty with eating because she feels the hernia is putting pressure on her abdomen.  She has issues with constipation, but her last bowel movement was last night.  She does have chronic nausea and vomiting and has for the last year.  She has never seen a GI doctor.  She was diagnosed with liver disease and concern for cirrhosis 1 to 2 years ago.  She underwent a CT abdomen and pelvis in 2022 when she presented to the emergency department.  At that time she was noted to have a small periumbilical hernia and there was recanalization of the umbilical vein, suggesting portal venous hypertension.  This imaging also demonstrated moderate hepatic steatosis and mild hepatomegaly.  She has had recent LFTs, with mild elevation in AST and ALT.  Her past medical history is significant for GERD, and hypertension.  She denies use of blood thinning medications.  She has no history of abdominal surgeries.  She smokes three quarters of a pack of cigarettes a day and drinks 4-6 beers daily.  She denies use of illicit drugs.  Past Medical History:  Diagnosis Date   GERD (gastroesophageal reflux disease)    Hernia of abdominal wall    HTN (hypertension)    SVT (supraventricular tachycardia)     No past surgical history on file.  Family History  Problem Relation Age of Onset   Cancer Mother    Leukemia Mother    Multiple myeloma Mother    Cancer Father     Social History   Tobacco Use   Smoking status: Every Day    Packs/day: 0.75    Years: 37.00     Additional pack years: 0.00    Total pack years: 27.75    Types: Cigarettes   Smokeless tobacco: Never  Substance Use Topics   Alcohol use: Yes    Comment: beer, 4-6 /day   Drug use: Never    Medications: I have reviewed the patient's current medications. Allergies as of 04/18/2022       Reactions   Bee Venom         Medication List        Accurate as of April 18, 2022 11:59 PM. If you have any questions, ask your nurse or doctor.          meloxicam 7.5 MG tablet Commonly known as: MOBIC Take 1 tablet (7.5 mg total) by mouth daily.   ondansetron 4 MG tablet Commonly known as: Zofran Take 1 tablet (4 mg total) by mouth every 8 (eight) hours as needed for nausea or vomiting.   Vitamin D3 25 MCG (1000 UT) capsule Generic drug: Cholecalciferol Take 1 capsule (1,000 Units total) by mouth daily. What changed: Another medication with the same name was removed. Continue taking this medication, and follow the directions you see here. Changed by: Kailen Name A Ruford Dudzinski, DO         ROS:  Constitutional: negative for chills and fatigue Eyes: negative for visual disturbance and pain Ears, nose, mouth, throat,  and face: positive for sinus problems, negative for ear drainage and sore mouth Respiratory: positive for cough, negative for wheezing and shortness of breath Cardiovascular: negative for chest pain and palpitations Gastrointestinal: positive for nausea and reflux symptoms, negative for abdominal pain Genitourinary:negative for dysuria and frequency Integument/breast: negative for dryness and rash Hematologic/lymphatic: negative for bleeding and lymphadenopathy Musculoskeletal:positive for back pain, negative for neck pain Neurological: positive for dizziness, negative for tremors Endocrine: negative for temperature intolerance  Blood pressure (!) 154/97, pulse 96, temperature 98 F (36.7 C), temperature source Oral, resp. rate 14, height 5' 2.5" (1.588 m), weight  122 lb (55.3 kg), SpO2 98 %. Physical Exam Vitals reviewed.  Constitutional:      Appearance: Normal appearance.  HENT:     Head: Normocephalic and atraumatic.  Eyes:     Extraocular Movements: Extraocular movements intact.     Pupils: Pupils are equal, round, and reactive to light.  Cardiovascular:     Rate and Rhythm: Normal rate and regular rhythm.  Pulmonary:     Effort: Pulmonary effort is normal.     Breath sounds: Normal breath sounds.  Abdominal:     Comments: Abdomen soft, nondistended, no percussion tenderness, mild tenderness to palpation in periumbilical area; no rigidity, guarding, or rebound tenderness; soft and reducible periumbilical hernia  Musculoskeletal:        General: Normal range of motion.     Cervical back: Normal range of motion.  Skin:    General: Skin is warm and dry.  Neurological:     General: No focal deficit present.     Mental Status: She is alert and oriented to person, place, and time.  Psychiatric:        Mood and Affect: Mood normal.        Behavior: Behavior normal.     Results: CT abdomen and pelvis (08/14/2020): IMPRESSION: Extensive inflammatory change involving a single loop of small bowel within the right lower quadrant in keeping with a focal infectious or inflammatory enteritis. No evidence of obstruction or perforation.   Moderate hepatic steatosis and mild hepatomegaly. Recanalization of the umbilical vein suggest changes of portal venous hypertension. Correlation with liver enzymes is recommended. Hepatic elastography may pre palpable for further management.   Indeterminate low-attenuation lesion within the right hepatic lobe. Comparison with prior examinations, if available, would be helpful in documenting stability and confirming an underlying benign etiology. If none are available, this could be better assessed with dedicated MRI examination.   Aortic Atherosclerosis (ICD10-I70.0).   Assessment & Plan:  Joan Wood is a 52 y.o. female who presents for evaluation of periumbilical hernia.  -On a CT scan done in 2022, she had concern for recanalization of the umbilical vein suggesting portal venous hypertension.  She is also had subsequently elevated LFTs on recent blood work.  With all of this in addition to the fact that the patient is still drinking, I am concerned that she has some amount of liver cirrhosis, which puts her at high risk for surgical interventions -Recommend that the patient establish care with a GI doctor to assist with management of her liver disease.  Referral sent -Explained to the patient that at this time, she is not a good candidate for surgical intervention.  If her liver disease is able to be better optimized, she may become a candidate for surgery, however she may need intervention at a tertiary care facility. -Information provided to the patient regarding hernias -Advised that she would need to  present to the hospital if she began to have a very tender and nonreducible hernia, nausea, vomiting, and obstipation  All questions were answered to the satisfaction of the patient.  Theophilus Kinds, DO [facility] [address] [place] [place], [name] [address] [phone] (office)

## 2022-04-30 NOTE — Progress Notes (Unsigned)
GI Office Note    Referring Provider: Lewie Chamber* Primary Care Physician:  Gilmore Laroche, FNP  Primary Gastroenterologist: Hennie Duos. Marletta Lor, DO  Chief Complaint   Chief Complaint  Patient presents with   New Patient (Initial Visit)    Referred for liver disease and alcohol abuse   History of Present Illness   Joan Wood is a 52 y.o. female presenting today at the request of Pappayliou, Gustavus Messing* for evaluation and management of possible cirrhosis related to chronic alcohol abuse.   CT A/P in August 2022: -Extensive inflammatory change involving single loop of small bowel in the right lower quadrant (infectious or inflammatory enteritis) -Moderate hepatic steatosis and mild hepatomegaly -Evidence of portal venous hypertension (recanalization of umbilical vein) -Recommended hepatic elastography for further management -Indeterminate low-attenuation lesion within the right hepatic lobe (advised MRI if no comparison with prior examination available)  Labs 03/28/22: Cr 0.55, Na 133, Albumin 4.5, Tbili 0.5, Alk Phos 104, AST 77, ALT 28, Plts 142, Hgb 13.8  Seen recently by general surgery for consideration of umbilical hernia repair given tenderness on exam she was noted to be drinking 4-6 beers daily.  She was noted to be high risk for surgical interventions given possibility of cirrhosis given her chronic alcohol use therefore she was recommended to see GI for management of liver disease.  She was advised that she was not a good candidate for surgical intervention at this time and that her liver disease needed to be better optimized and when she is optimized she may would benefit from intervention at tertiary care facility.  Today: Abdominal pain: daily basis but is generalized. Upper abdomen mostly.  Abdominal distention: Has been ongoing for a while. No peripheral edema.  Fever/chills: none Episodes of confusion/disorientation: Reports she is forgetful often -  has to write everything down.  Number of daily bowel movements: Sometimes 5-6 stools when loose, sometimes once a day. Alcohol use: average is 4-6, twice a year she drinks 8. No liquor or wine.  Jaundice: none Pruritus: none  Having nausea and diarrhea almost on a daily basis. Has acid reflux. Has tried tums, rolaids, and pepto. Had used phenergan in the past - lasted 6-8 months. Has a lot of burping. Sometimes has some epigastric pain that is fairly frequent. Symptoms have been ongoing for about a year.  Having vomiting almost daily. Reports food comes up a few hours after eating usually. Can take her all day to eat even a small sandwich. Has early satiety. No unintentional weight loss.   Also reports history of hemorrhoids. Intermittent rectal bleeding. 1/2 of the month. Usually occurs after frequent wiping. Reports external and internal hemorrhoids. No rectal pain currently.   No chest pain or shortness of breath. Gets out of breath going up stairs at times.  Has cut back on tobacco use (1/2 pack per day)  Prior colonoscopy? - colonoscopy in early 30s. Had benign polyps. (Reports performed at [facility])  LMP - many years ago.   Current Outpatient Medications  Medication Sig Dispense Refill   Cholecalciferol (VITAMIN D3) 25 MCG (1000 UT) capsule Take 1 capsule (1,000 Units total) by mouth daily. 90 capsule 0   meloxicam (MOBIC) 7.5 MG tablet Take 1 tablet (7.5 mg total) by mouth daily. (Patient not taking: Reported on 05/01/2022) 10 tablet 0   ondansetron (ZOFRAN) 4 MG tablet Take 1 tablet (4 mg total) by mouth every 8 (eight) hours as needed for nausea or vomiting. (Patient not taking:  Reported on 05/01/2022) 20 tablet 0   No current facility-administered medications for this visit.    Past Medical History:  Diagnosis Date   GERD (gastroesophageal reflux disease)    Hernia of abdominal wall    HTN (hypertension)    SVT (supraventricular tachycardia)     No past surgical history on  file.  Family History  Problem Relation Age of Onset   Cancer Mother    Leukemia Mother    Multiple myeloma Mother    Cancer Father     Allergies as of 05/01/2022 - Review Complete 05/01/2022  Allergen Reaction Noted   Bee venom  08/14/2020    Social History   Socioeconomic History   Marital status: Single    Spouse name: Not on file   Number of children: Not on file   Years of education: Not on file   Highest education level: Not on file  Occupational History   Not on file  Tobacco Use   Smoking status: Every Day    Packs/day: 0.75    Years: 37.00    Additional pack years: 0.00    Total pack years: 27.75    Types: Cigarettes   Smokeless tobacco: Never  Substance and Sexual Activity   Alcohol use: Yes    Comment: beer, 4-6 /day   Drug use: Never   Sexual activity: Not Currently  Other Topics Concern   Not on file  Social History Narrative   Not on file   Social Determinants of Health   Financial Resource Strain: Not on file  Food Insecurity: Not on file  Transportation Needs: Not on file  Physical Activity: Not on file  Stress: Not on file  Social Connections: Not on file  Intimate Partner Violence: Not on file   Review of Systems   Gen: Denies any fever, chills, fatigue, weight loss, lack of appetite.  CV: Denies chest pain, heart palpitations, peripheral edema, syncope.  Resp: + cough and shortness of breath on exertion. Denies shortness of breath at rest. Denies wheezing  GI: see HPI GU : Denies urinary burning, urinary frequency, urinary hesitancy MS: Denies joint pain, muscle weakness, cramps, or limitation of movement.  Derm: Denies rash, itching, dry skin Psych: Denies depression, anxiety, memory loss, and confusion Heme: Denies bruising, bleeding, and enlarged lymph nodes.  Physical Exam   BP (!) 170/106   Pulse (!) 111   Temp 97.6 F (36.4 C)   Ht  (1.575 m)   Wt 119 lb (54 kg)   BMI 21.77 kg/m   General:   Alert and oriented.  Pleasant and cooperative. Well-nourished and well-developed.  Head:  Normocephalic and atraumatic. Eyes:  Without icterus, sclera clear and conjunctiva pink.  Ears:  Normal auditory acuity. Lungs:  Clear to auscultation bilaterally. No wheezes, rales, or rhonchi. No distress.  Heart:  S1, S2 present without murmurs appreciated.  Abdomen:  +BS, soft.  Mild diffuse TTP distended.  Small, Soft but mildly tender umbilical hernia. no HSM noted. No guarding or rebound. Rectal: Patient declined Msk:  Symmetrical without gross deformities. Normal posture. Extremities:  Without edema. Neurologic:  Alert and  oriented x4;  grossly normal neurologically. Skin:  Intact without significant lesions or rashes. Psych:  Alert and cooperative. Normal mood and affect.   Assessment   Joan Wood is a 52 y.o. female with a history of alcohol abuse, GERD, HTN, SVT, periumbilical hernia presenting today for further evaluation of possible cirrhosis.   Hepatic steatosis, Concern for cirrhosis,  thrombocytopenia, liver lesion: CT in 2022 with evidence of portal hypertension, hepatic steatosis, and low-attenuation lesion within the right hepatic lobe.  Was recommended to have follow-up MRI for better evaluation of liver lesion.  Most recent labs 03/28/2022 with elevated AST.  Normal alk phos, bili, and ALT.  Hemoglobin stable at 13.8 with thrombocytopenia (platelets 142).  She does report some intermittent abdominal bloating and discomfort.  Experiences early satiety and at times some shortness of breath (could be multifactorial in the setting of chronic tobacco use).  Unintentional weight loss, jaundice, pruritus, hallucinations.  Does report some intermittent forgetfulness.  Given her thrombocytopenia and elevated LFTs there is some concern for early cirrhosis given her chronic alcohol use of 4-6 beers a day, sometimes up to 8.  Given her prior liver lesion noted on CT in 2022 we will perform an MRI for evaluation of her  liver.  GERD, nausea/vomiting: Symptoms consistent with uncontrolled reflux.  Not currently taking any medication.  Occasionally will take Tums, Rolaids, or Pepto-Bismol.  Has daily nausea and vomiting.  Present tobacco and alcohol user.  Drinks 4-6 beers on average daily.  Also with frequent burping.  Denies any dysphagia.  Will evaluate further with an upper endoscopy for evaluation of esophagitis, gastritis, duodenitis, peptic ulcer disease.  Will also check viral hepatitis panel and autoimmune serologies, and ferritin.  We discussed the importance of alcohol cessation today and the risk of progression to cirrhosis if not already present.  Also discussed other necessary dietary and lifestyle recommendations.  Patient does not appear ready to quit alcohol at this point.  Hemorrhoids, rectal bleeding, history of colon polyps: Patient reports chronic history of intermittent hemorrhoids related to frequent wiping.  She states she knows she has external hemorrhoids and on sure she has internal.  As of rectal bleeding a month mostly related to episodes of diarrhea for which she sometimes has 4/day.  Chronic alcohol use.  Rectal exam deferred today.  Last colonoscopy she reported her early 30s and had a history of colon polyps.  Denies any family history of colon cancer or colon polyps.  Given that she is undergoing upper endoscopy for her upper GI symptoms we will also perform colonoscopy for further evaluation of her rectal bleeding and surveillance given history of colon polyps.  Given her hypertension today have advised her to follow-up with her primary care regarding this.  She should follow heart healthy diet and perform exercise as tolerated to aid in improving her blood pressure.  PLAN   Omeprazole 40 mg daily 30 minutes prior to breakfast.  GERD diet/lifestyle modifications.  Recommend folic acid supplementation and thiamine.  Proceed with upper endoscopy and colonoscopy with propofol by Dr. Marletta Lor  in near future: the risks, benefits, and alternatives have been discussed with the patient in detail. The patient states understanding and desires to proceed. ASA 3 Zofran 4 mg as needed.  MRI liver Viral hepatitis panel, ANA, ASMA, AMA, IgG, iron panel High-protein diet from a primarily plant-based diet. Avoid red meat.  No raw or undercooked meat, seafood, or shellfish. Low-fat/cholesterol/carbohydrate diet.  Limit sodium to no more than 2000 mg/day including everything that you eat and drink. Recommend at least 30 minutes of aerobic and resistance exercise 3 days/week. Limit Tylenol to 2000 mg daily.  Alcohol cessation Follow-up PCP for management of hypertension Follow up in 3 months   Brooke Bonito, MSN, FNP-BC, AGACNP-BC [facility]

## 2022-05-01 ENCOUNTER — Ambulatory Visit: Payer: Medicaid Other | Admitting: Gastroenterology

## 2022-05-01 ENCOUNTER — Encounter: Payer: Self-pay | Admitting: Gastroenterology

## 2022-05-01 ENCOUNTER — Telehealth: Payer: Self-pay | Admitting: *Deleted

## 2022-05-01 VITALS — BP 170/106 | HR 111 | Temp 97.6°F | Ht 62.0 in | Wt 119.0 lb

## 2022-05-01 DIAGNOSIS — R112 Nausea with vomiting, unspecified: Secondary | ICD-10-CM | POA: Diagnosis not present

## 2022-05-01 DIAGNOSIS — K625 Hemorrhage of anus and rectum: Secondary | ICD-10-CM

## 2022-05-01 DIAGNOSIS — K219 Gastro-esophageal reflux disease without esophagitis: Secondary | ICD-10-CM

## 2022-05-01 DIAGNOSIS — R7989 Other specified abnormal findings of blood chemistry: Secondary | ICD-10-CM

## 2022-05-01 DIAGNOSIS — K649 Unspecified hemorrhoids: Secondary | ICD-10-CM

## 2022-05-01 DIAGNOSIS — K769 Liver disease, unspecified: Secondary | ICD-10-CM | POA: Diagnosis not present

## 2022-05-01 DIAGNOSIS — K766 Portal hypertension: Secondary | ICD-10-CM | POA: Diagnosis not present

## 2022-05-01 MED ORDER — ONDANSETRON HCL 4 MG PO TABS: 4.0000 mg | ORAL_TABLET | Freq: Three times a day (TID) | ORAL | 0 refills | Status: DC | PRN

## 2022-05-01 MED ORDER — ONDANSETRON HCL 4 MG PO TABS: 4.0000 mg | ORAL_TABLET | Freq: Two times a day (BID) | ORAL | 0 refills | Status: DC | PRN

## 2022-05-01 MED ORDER — OMEPRAZOLE 40 MG PO CPDR: 40.0000 mg | DELAYED_RELEASE_CAPSULE | Freq: Every day | ORAL | 2 refills | Status: DC

## 2022-05-01 NOTE — Telephone Encounter (Signed)
PA approved via [employer] for MRI.  Order ID: [id number]   Approval Valid Through: 05/01/2022 - 06/29/2022 ---  MRI/MRCP scheduled for 5/10, ARRIVAL 11:30AM, NPO 4 HRS PRIOR.

## 2022-05-01 NOTE — Telephone Encounter (Signed)
LMOVM to call back to give MRI appt details and to schedule for TCS/EGD with Dr. Marletta Lor, ASA 3

## 2022-05-01 NOTE — Patient Instructions (Addendum)
Begin taking omeprazole 40 mg daily, 30 minutes prior to breakfast.  Follow a GERD diet:  Avoid fried, fatty, greasy, spicy, citrus foods. Avoid caffeine and carbonated beverages. Avoid chocolate. Try eating 4-6 small meals a day rather than 3 large meals. Do not eat within 3 hours of laying down. Prop head of bed up on wood or bricks to create a 6 inch incline.  I have sent a prescription for Zofran for you to take 4 mg twice daily as needed.  Diet/lifestyle Recommendations:  High-protein diet from a primarily plant-based diet. Avoid red meat.  No raw or undercooked meat, seafood, or shellfish. Low-fat/cholesterol/carbohydrate diet. Limit sodium to no more than 2000 mg/day including everything that you eat and drink. Recommend at least 30 minutes of aerobic and resistance exercise 3 days/week. Limit Tylenol to 2000 mg daily.  Alcohol cessation  I would recommend you start over-the-counter folic acid and thiamine supplements.  Will schedule you for an upper endoscopy and colonoscopy in the near future to further evaluate your nausea, vomiting, and screen for colon cancer given your history of colon polyps.  We will plan to follow-up in 3 months, sooner if needed.  It was a pleasure to see you today. I want to create trusting relationships with patients. If you receive a survey regarding your visit,  I greatly appreciate you taking time to fill this out on paper or through your MyChart. I value your feedback.  Brooke Bonito, MSN, FNP-BC, AGACNP-BC [facility]

## 2022-05-02 ENCOUNTER — Other Ambulatory Visit: Payer: Self-pay

## 2022-05-02 ENCOUNTER — Encounter: Payer: Self-pay | Admitting: Gastroenterology

## 2022-05-02 DIAGNOSIS — E559 Vitamin D deficiency, unspecified: Secondary | ICD-10-CM

## 2022-05-02 MED ORDER — VITAMIN D3 25 MCG (1000 UT) PO CAPS: 1000.0000 [IU] | ORAL_CAPSULE | Freq: Every day | ORAL | 0 refills | Status: DC

## 2022-05-10 ENCOUNTER — Telehealth: Payer: Self-pay | Admitting: Family Medicine

## 2022-05-10 NOTE — Telephone Encounter (Signed)
Patient called she is in a lot of pain when walking, and she does not think she can come into the office to be seen to much pain to walk, does not do no video she said.  I explained patient would need an in office visit or video, she refused just needs something now for the pain for hip and leg pain. Pharmacy: [facility]

## 2022-05-11 ENCOUNTER — Ambulatory Visit: Payer: Medicaid Other | Admitting: Family Medicine

## 2022-05-19 ENCOUNTER — Ambulatory Visit (HOSPITAL_COMMUNITY): Payer: Medicaid Other

## 2022-05-22 ENCOUNTER — Encounter: Payer: Self-pay | Admitting: Internal Medicine

## 2022-05-22 ENCOUNTER — Ambulatory Visit: Payer: Medicaid Other | Admitting: Internal Medicine

## 2022-05-22 VITALS — BP 145/89 | HR 92 | Ht 62.0 in | Wt 123.0 lb

## 2022-05-22 DIAGNOSIS — M25552 Pain in left hip: Secondary | ICD-10-CM | POA: Diagnosis not present

## 2022-05-22 MED ORDER — MELOXICAM 7.5 MG PO TABS
7.5000 mg | ORAL_TABLET | Freq: Every day | ORAL | 0 refills | Status: DC
Start: 2022-05-22 — End: 2022-06-01

## 2022-05-22 MED ORDER — METHOCARBAMOL 750 MG PO TABS
ORAL_TABLET | ORAL | 0 refills | Status: AC
Start: 2022-05-22 — End: 2022-05-29

## 2022-05-22 NOTE — Assessment & Plan Note (Addendum)
Patient presents with worsening of pain described on 3/19 and normal xrays reviewed. No falls or trauma to hip. Pain is aggravating with movement and she has difficulty walking. She has continued OTC tylenol and ibuprofen with slight improvement of pain. She was prescribed Meloxicam at last visit but can not recall if it helped. No hematuria or dysuria.   Will prescribe meloxicam and Robaxin . I would like patient to start PT, referral placed. Follow up in one month to see if pain has improved with strengthening lower back and hip muscles.

## 2022-05-22 NOTE — Progress Notes (Signed)
   HPI:[name] is a 52 y.o. female who presents for evaluation of Hip Pain (Patient is complaining of left hip pain, having trouble walking. OTC tylenol and ibuprofen is not helping. ) . For the details of today's visit, please refer to the assessment and plan.  Physical Exam: Vitals:   05/22/22 0808  BP: (!) 145/89  Pulse: 92  SpO2: 96%  Weight: 123 lb (55.8 kg)  Height: 5\' 2"  (1.575 m)     Physical Exam Cardiovascular:     Rate and Rhythm: Normal rate and regular rhythm.     Pulses: Normal pulses.  Musculoskeletal:     Comments: No deformity. No skin changes.  Pain with touching back lightly.  Pain with SLR on left at 45 degrees.  Patient unable to lie on right side for left hip testing.  Antalgic gait  Skin:    Findings: No bruising.  Neurological:     General: No focal deficit present.  Psychiatric:        Mood and Affect: Affect is labile.      Assessment & Plan:   Joan Wood was seen today for hip pain.  Left hip pain Assessment & Plan: Patient presents with worsening of pain described on 3/19 and normal xrays reviewed. No falls or trauma to hip. Pain is aggravating with movement and she has difficulty walking. She has continued OTC tylenol and ibuprofen with slight improvement of pain. She was prescribed Meloxicam at last visit but can not recall if it helped. No hematuria or dysuria.   Will prescribe meloxicam and Robaxin . I would like patient to start PT, referral placed. Follow up in one month to see if pain has improved with strengthening lower back and hip muscles.    Orders: -     Ambulatory referral to Physical Therapy -     Methocarbamol; Take 2 tablets (1,500 mg total) by mouth 3 (three) times daily for 2 days, THEN 1 tablet (750 mg total) every 8 (eight) hours as needed for up to 6 days for muscle spasms.  Dispense: 30 tablet; Refill: 0 -     Meloxicam; Take 1 tablet (7.5 mg total) by mouth daily.  Dispense: 10 tablet; Refill:  0      Milus Banister, MD

## 2022-05-22 NOTE — Patient Instructions (Addendum)
Thank you, [name] for allowing [place] to provide your care today.   You have pain in your lower back radiating down to your hip. I have prescribed 2 medication for you . Also referred you to physical therapy.  Meloxicam is a medication similar to Ibuprofen . Do not take ibuprofen when taking meloxicam.   Follow up in one month       Thurmon Fair, M.D.

## 2022-05-31 ENCOUNTER — Telehealth: Payer: Self-pay | Admitting: Family Medicine

## 2022-05-31 NOTE — Telephone Encounter (Signed)
Patient called said she has been taking 3 of the extra strength tylenol  and not helping at all and asking can something be called into her pharmacy for the pain.  Can not take ibuprofen with the other medications she is currently taking.  Patient said she can not get out of the bed she is at the point to amputate to get rid of the pain.  She just wants the pain to go away.  [facility]

## 2022-05-31 NOTE — Telephone Encounter (Signed)
Prescription Request  05/31/2022  LOV: Visit date not found  What is the name of the medication or equipment? meloxicam (MOBIC) 7.5 MG tablet Pt is out of med and next follow u is not until 6/17 methocarbamol (ROBAXIN) 750 MG tablet  states that she lost bottle of med never had a chance to take med   Have you contacted your pharmacy to request a refill? No   Which pharmacy would you like this sent to?  [id number] [place], [place] - [address] AT [facility] [address] [place] [address] Phone: [phone] Fax: [phone]    Patient notified that their request is being sent to the clinical staff for review and that they should receive a response within 2 business days.   Please advise at [facility] [phone]   Pt wants a call back when med(s) have been sent in.

## 2022-06-01 ENCOUNTER — Other Ambulatory Visit: Payer: Self-pay

## 2022-06-01 ENCOUNTER — Telehealth: Payer: Self-pay | Admitting: Family Medicine

## 2022-06-01 DIAGNOSIS — M25552 Pain in left hip: Secondary | ICD-10-CM

## 2022-06-01 MED ORDER — MELOXICAM 7.5 MG PO TABS: 7.5000 mg | ORAL_TABLET | Freq: Every day | ORAL | 0 refills | Status: DC

## 2022-06-01 NOTE — Telephone Encounter (Signed)
Pt call in regards to previous encounter. She is in pain and requested something for pain. Please call her asap. She also requested a refill.    meloxicam (MOBIC) 7.5 MG tablet

## 2022-06-01 NOTE — Telephone Encounter (Signed)
Pt has been scheduled to see Malachi Bonds on 06/02/22, Dr. Barbaraann Faster sent rx for pt, spoke to pt states she will pick up rx.

## 2022-06-01 NOTE — Telephone Encounter (Signed)
I saw patient on 5/13 for hip pain. She has exacerbation of pain today. Please send Mobic 7.5 mg qd x 7 days. She has a follow up visit scheduled and I recommended rescheduling for a sooner date if pain is worsening.

## 2022-06-02 ENCOUNTER — Ambulatory Visit: Payer: Medicaid Other | Admitting: Family Medicine

## 2022-06-02 ENCOUNTER — Encounter: Payer: Self-pay | Admitting: Family Medicine

## 2022-06-02 VITALS — BP 124/84 | HR 96 | Ht 62.0 in | Wt 120.0 lb

## 2022-06-02 DIAGNOSIS — M25552 Pain in left hip: Secondary | ICD-10-CM

## 2022-06-02 DIAGNOSIS — Z1231 Encounter for screening mammogram for malignant neoplasm of breast: Secondary | ICD-10-CM

## 2022-06-02 DIAGNOSIS — Z122 Encounter for screening for malignant neoplasm of respiratory organs: Secondary | ICD-10-CM

## 2022-06-02 DIAGNOSIS — Z1211 Encounter for screening for malignant neoplasm of colon: Secondary | ICD-10-CM

## 2022-06-02 MED ORDER — MELOXICAM 7.5 MG PO TABS: 7.5000 mg | ORAL_TABLET | Freq: Two times a day (BID) | ORAL | 0 refills | Status: DC

## 2022-06-02 NOTE — Patient Instructions (Addendum)
   I appreciate the opportunity to provide care to you today!    Follow up:  pap in 4 weeks   Please start taking meloxicam 7.5 mg BID for your hip pain  Apply heat to the affected area such as a moist heat pack or a heating pad. Place a towel between your skin and the heat source. Leave the heat on for 20-30 minutes. Remove the heat if your skin turns bright red. This is especially important if you are unable to feel pain, heat, or cold. You may have a greater risk of getting burned. Apply  ice on the painful area. To do this: If you have a removable splint, remove it as told by your health care provider. Put ice in a plastic bag. Place a towel between your skin and the bag or between your splint and the bag. Leave the ice on for 20 minutes, 2-3 times a day.      Please continue to a heart-healthy diet and increase your physical activities. Try to exercise for at least five days a week.      It was a pleasure to see you and I look forward to continuing to work together on your health and well-being. Please do not hesitate to call the office if you need care or have questions about your care.   Have a wonderful day and week. With Gratitude, Gilmore Laroche MSN, FNP-BC

## 2022-06-02 NOTE — Progress Notes (Signed)
Established Patient Office Visit  Subjective:  Patient ID: Joan Wood, female    DOB: July 12, 1970  Age: 52 y.o. MRN: [id number]  CC:  Chief Complaint  Patient presents with   Chronic Care Management    Has seen Dr. Barbaraann Faster for new patient visit. Pt f/u for left hip pain. Meloxicam was sent by Dr. Barbaraann Faster yesterday. Pt reports left hip pain is getting worse and worse unsure what is causing this has been taking extra strength tylenol. Pt also reports bruises that show up out of nowhere.     HPI Joan Wood is a 52 y.o. female with past medical history of alcohol use disorder, left hip pain and periumbilical hernia presents for f/u of  chronic medical conditions. For the details of today's visit, please refer to the assessment and plan.     Past Medical History:  Diagnosis Date   GERD (gastroesophageal reflux disease)    Hernia of abdominal wall    HTN (hypertension)    SVT (supraventricular tachycardia)     Past Surgical History:  Procedure Laterality Date   FINGER FRACTURE SURGERY     right ankle repair     age 21    Family History  Problem Relation Age of Onset   Cancer Mother    Leukemia Mother    Multiple myeloma Mother    Cancer Father        unsure what kind   Cancer - Colon Neg Hx    Colon polyps Neg Hx     Social History   Socioeconomic History   Marital status: Single    Spouse name: Not on file   Number of children: Not on file   Years of education: Not on file   Highest education level: Not on file  Occupational History   Not on file  Tobacco Use   Smoking status: Every Day    Packs/day: 0.75    Years: 37.00    Additional pack years: 0.00    Total pack years: 27.75    Types: Cigarettes   Smokeless tobacco: Never  Vaping Use   Vaping Use: Never used  Substance and Sexual Activity   Alcohol use: Yes    Comment: beer, 4-6 /day   Drug use: Never   Sexual activity: Not Currently  Other Topics Concern   Not on file  Social History Narrative    Not on file   Social Determinants of Health   Financial Resource Strain: Not on file  Food Insecurity: Not on file  Transportation Needs: Not on file  Physical Activity: Not on file  Stress: Not on file  Social Connections: Not on file  Intimate Partner Violence: Not on file    Outpatient Medications Prior to Visit  Medication Sig Dispense Refill   Cholecalciferol (VITAMIN D3) 25 MCG (1000 UT) CAPS Take 1 capsule (1,000 Units total) by mouth daily. 90 capsule 0   omeprazole (PRILOSEC) 40 MG capsule Take 1 capsule (40 mg total) by mouth daily. 30 capsule 2   ondansetron (ZOFRAN) 4 MG tablet Take 1 tablet (4 mg total) by mouth 2 (two) times daily as needed for nausea or vomiting. 20 tablet 0   meloxicam (MOBIC) 7.5 MG tablet Take 1 tablet (7.5 mg total) by mouth daily. 7 tablet 0   No facility-administered medications prior to visit.    Allergies  Allergen Reactions   Bee Venom     ROS Review of Systems  Constitutional:  Negative for chills and fever.  Eyes:  Negative for visual disturbance.  Respiratory:  Negative for chest tightness and shortness of breath.   Musculoskeletal:        Left hip pain  Neurological:  Negative for dizziness and headaches.      Objective:    Physical Exam HENT:     Head: Normocephalic.     Mouth/Throat:     Mouth: Mucous membranes are moist.  Cardiovascular:     Rate and Rhythm: Normal rate.     Heart sounds: Normal heart sounds.  Pulmonary:     Effort: Pulmonary effort is normal.     Breath sounds: Normal breath sounds.  Musculoskeletal:     Right hip: Tenderness present. No deformity, lacerations or crepitus.     Left hip: No deformity, lacerations or crepitus.  Neurological:     Mental Status: She is alert.     BP 124/84 (BP Location: Left Arm)   Pulse 96   Ht 5\' 2"  (1.575 m)   Wt 120 lb (54.4 kg)   SpO2 95%   BMI 21.95 kg/m  Wt Readings from Last 3 Encounters:  06/02/22 120 lb (54.4 kg)  05/22/22 123 lb (55.8 kg)   05/01/22 119 lb (54 kg)    Lab Results  Component Value Date   TSH 3.470 03/28/2022   Lab Results  Component Value Date   WBC 5.4 03/28/2022   HGB 13.8 03/28/2022   HCT 39.0 03/28/2022   MCV 95 03/28/2022   PLT 142 (L) 03/28/2022   Lab Results  Component Value Date   NA 133 (L) 03/28/2022   K 4.8 03/28/2022   CO2 21 03/28/2022   GLUCOSE 79 03/28/2022   BUN 7 03/28/2022   CREATININE 0.55 (L) 03/28/2022   BILITOT 0.5 03/28/2022   ALKPHOS 104 03/28/2022   AST 77 (H) 03/28/2022   ALT 28 03/28/2022   PROT 8.4 03/28/2022   ALBUMIN 4.5 03/28/2022   CALCIUM 9.9 03/28/2022   ANIONGAP 10 08/14/2020   EGFR 110 03/28/2022   Lab Results  Component Value Date   CHOL 206 (H) 03/28/2022   Lab Results  Component Value Date   HDL 103 03/28/2022   Lab Results  Component Value Date   LDLCALC 86 03/28/2022   Lab Results  Component Value Date   TRIG 99 03/28/2022   Lab Results  Component Value Date   CHOLHDL 2.0 03/28/2022   Lab Results  Component Value Date   HGBA1C 4.9 03/28/2022      Assessment & Plan:  Left hip pain Assessment & Plan: Patient complains of left hip pain Pain is rated as 55 out of 10 X-ray of the left hip ordered on 03/28/2022 showed no fractures or dislocation She complains of the pain in her left hip is sharp radiating down her lower extremity Robaxin-750 milligrams and meloxicam was ordered and the patient reports compliance with minimal relief She is scheduled for some physical therapy  Will increase the frequency for meloxicam to twice daily Encouraged cold and heat therapy at the affected site  Orders: -     Meloxicam; Take 1 tablet (7.5 mg total) by mouth in the morning and at bedtime.  Dispense: 90 tablet; Refill: 0  Colon cancer screening -     Ambulatory referral to Gastroenterology  Screening for lung cancer -     Ambulatory referral to Pulmonology  Breast cancer screening by mammogram -     3D Screening Mammogram, Left and  Right   Note: This chart has  been completed using Engineer, civil (consulting) software, and while attempts have been made to ensure accuracy, certain words and phrases may not be transcribed as intended.    Follow-up: Return in about 4 weeks (around 06/30/2022).   Gilmore Laroche, FNP

## 2022-06-02 NOTE — Assessment & Plan Note (Signed)
Patient complains of left hip pain Pain is rated as 55 out of 10 X-ray of the left hip ordered on 03/28/2022 showed no fractures or dislocation She complains of the pain in her left hip is sharp radiating down her lower extremity Robaxin-750 milligrams and meloxicam was ordered and the patient reports compliance with minimal relief She is scheduled for some physical therapy  Will increase the frequency for meloxicam to twice daily Encouraged cold and heat therapy at the affected site

## 2022-06-06 ENCOUNTER — Encounter (INDEPENDENT_AMBULATORY_CARE_PROVIDER_SITE_OTHER): Payer: Self-pay | Admitting: *Deleted

## 2022-06-14 ENCOUNTER — Ambulatory Visit (HOSPITAL_COMMUNITY): Admission: RE | Admit: 2022-06-14 | Payer: Medicaid Other | Source: Ambulatory Visit

## 2022-06-15 ENCOUNTER — Telehealth: Payer: Self-pay | Admitting: Family Medicine

## 2022-06-15 NOTE — Telephone Encounter (Signed)
Patient called asking can the MRI she is having done tomorrow can provider also add the left hip along with her abdomen. Having a lot of hip pain can barely walk. Please contact patient back to let her know either way.

## 2022-06-15 NOTE — Telephone Encounter (Signed)
Kindly encourage to f/u  with orthopedic on 06/26/2022 and continue current treatment regimen

## 2022-06-16 ENCOUNTER — Ambulatory Visit (HOSPITAL_COMMUNITY): Admission: RE | Admit: 2022-06-16 | Payer: Medicaid Other | Source: Ambulatory Visit

## 2022-06-16 ENCOUNTER — Other Ambulatory Visit (HOSPITAL_COMMUNITY): Payer: Self-pay | Admitting: Gastroenterology

## 2022-06-16 ENCOUNTER — Encounter: Payer: Self-pay | Admitting: Internal Medicine

## 2022-06-16 DIAGNOSIS — R1084 Generalized abdominal pain: Secondary | ICD-10-CM

## 2022-06-16 NOTE — Telephone Encounter (Signed)
Pt called about recent encounter below. Please contact pt asap. Thanks!

## 2022-06-16 NOTE — Telephone Encounter (Signed)
Lvm for pt informing her of Joan Wood message.

## 2022-06-19 ENCOUNTER — Ambulatory Visit: Payer: Medicaid Other | Admitting: Internal Medicine

## 2022-06-20 ENCOUNTER — Telehealth: Payer: Self-pay | Admitting: Family Medicine

## 2022-06-20 ENCOUNTER — Other Ambulatory Visit: Payer: Self-pay | Admitting: Family Medicine

## 2022-06-20 DIAGNOSIS — M25552 Pain in left hip: Secondary | ICD-10-CM

## 2022-06-20 MED ORDER — METHOCARBAMOL 500 MG PO TABS
500.0000 mg | ORAL_TABLET | Freq: Two times a day (BID) | ORAL | 0 refills | Status: DC | PRN
Start: 2022-06-20 — End: 2023-04-23

## 2022-06-20 NOTE — Telephone Encounter (Signed)
Patient called need refill on muscle releaxer  Methocarbamol  bid and then 1 tablet as prn   [facility]acy: [facility]

## 2022-06-20 NOTE — Telephone Encounter (Signed)
Please inform the patient that [facility] [place]

## 2022-06-20 NOTE — Telephone Encounter (Signed)
Left vm

## 2022-06-26 ENCOUNTER — Ambulatory Visit: Payer: Medicaid Other | Admitting: Family Medicine

## 2022-06-26 ENCOUNTER — Ambulatory Visit (HOSPITAL_COMMUNITY): Payer: Medicaid Other

## 2022-07-03 ENCOUNTER — Ambulatory Visit: Payer: Medicaid Other | Admitting: Internal Medicine

## 2022-07-10 ENCOUNTER — Ambulatory Visit: Payer: Medicaid Other | Admitting: Family Medicine

## 2022-08-14 ENCOUNTER — Ambulatory Visit (HOSPITAL_COMMUNITY): Payer: Medicaid Other | Admitting: Physical Therapy

## 2022-10-02 ENCOUNTER — Ambulatory Visit
Admission: EM | Admit: 2022-10-02 | Discharge: 2022-10-02 | Disposition: A | Discharge status: Home / Self Care | Payer: Medicaid Other | Attending: Nurse Practitioner | Admitting: Nurse Practitioner

## 2022-10-02 DIAGNOSIS — M5442 Lumbago with sciatica, left side: Secondary | ICD-10-CM

## 2022-10-02 MED ORDER — DEXAMETHASONE SODIUM PHOSPHATE 10 MG/ML IJ SOLN
10.0000 mg | INTRAMUSCULAR | Status: AC
  Administered 2022-10-02: 10 mg via INTRAMUSCULAR

## 2022-10-02 MED ORDER — PREDNISONE 20 MG PO TABS: 40.0000 mg | ORAL_TABLET | Freq: Every day | ORAL | 0 refills | Status: AC

## 2022-10-02 MED ORDER — KETOROLAC TROMETHAMINE 30 MG/ML IJ SOLN
30.0000 mg | Freq: Once | INTRAMUSCULAR | Status: AC
  Administered 2022-10-02: 30 mg via INTRAMUSCULAR

## 2022-10-02 NOTE — ED Provider Notes (Signed)
[facility]    CSN: [id number] Arrival date & time: 10/02/22  1401      History   Chief Complaint Chief Complaint  Patient presents with   Back Pain    HPI Joan Wood is a 52 y.o. female.   The history is provided by the patient and a friend.   Patient presents for complaints of left buttock/hip pain has been present for the past year.  Patient states over the last several days, her pain has worsened.  She complains of pain that starts in the left buttock and radiates down into the left lower leg.  She also complains of numbness and tingling in that extremity.  She states the pain becomes so intense at times, that she is unable to walk.  She denies any known injury or trauma.  She states that in the past, symptoms have come and gone.  Patient has been seen by her PCP for left hip pain and was referred to physical therapy.  She reports that she did not attend the physical therapy appointments, she states she is unsure if the appointments were canceled or why she was unable to attend the appointments.  Patient further denies loss of bowel or bladder function or lower extremity weakness.  She has never been evaluated by orthopedics.  Past Medical History:  Diagnosis Date   GERD (gastroesophageal reflux disease)    Hernia of abdominal wall    HTN (hypertension)    SVT (supraventricular tachycardia)     Patient Active Problem List   Diagnosis Date Noted   Alcohol use disorder 03/28/2022   Left hip pain 03/28/2022   Periumbilical hernia 03/28/2022    Past Surgical History:  Procedure Laterality Date   FINGER FRACTURE SURGERY     right ankle repair     age 31    OB History   No obstetric history on file.      Home Medications    Prior to Admission medications   Medication Sig Start Date End Date Taking? Authorizing Provider  Cholecalciferol (VITAMIN D3) 25 MCG (1000 UT) CAPS Take 1 capsule (1,000 Units total) by mouth daily. 05/02/22  Yes Gilmore Laroche, FNP  meloxicam (MOBIC) 7.5 MG tablet TAKE 1 TABLET (7.5 MG TOTAL) BY MOUTH IN THE MORNING AND AT BEDTIME. 06/20/22  Yes Gilmore Laroche, FNP  omeprazole (PRILOSEC) 40 MG capsule Take 1 capsule (40 mg total) by mouth daily. 05/01/22  Yes Mahon, Frederik Schmidt, NP  ondansetron (ZOFRAN) 4 MG tablet Take 1 tablet (4 mg total) by mouth 2 (two) times daily as needed for nausea or vomiting. 05/01/22  Yes Mahon, Frederik Schmidt, NP  methocarbamol (ROBAXIN) 500 MG tablet Take 1 tablet (500 mg total) by mouth 2 (two) times daily as needed for muscle spasms. 06/20/22   Gilmore Laroche, FNP    Family History Family History  Problem Relation Age of Onset   Cancer Mother    Leukemia Mother    Multiple myeloma Mother    Cancer Father        unsure what kind   Cancer - Colon Neg Hx    Colon polyps Neg Hx     Social History Social History   Tobacco Use   Smoking status: Every Day    Current packs/day: 0.75    Average packs/day: 0.8 packs/day for 37.0 years (27.8 ttl pk-yrs)    Types: Cigarettes   Smokeless tobacco: Never  Vaping Use   Vaping status: Never Used  Substance Use  Topics   Alcohol use: Yes    Comment: beer, 4-6 /day   Drug use: Never     Allergies   Bee venom   Review of Systems Review of Systems Per HPI  Physical Exam Triage Vital Signs ED Triage Vitals  Encounter Vitals Group     BP 10/02/22 1522 (!) 147/91     Systolic BP Percentile --      Diastolic BP Percentile --      Pulse Rate 10/02/22 1522 (!) 104     Resp 10/02/22 1522 18     Temp 10/02/22 1522 98.7 F (37.1 C)     Temp Source 10/02/22 1522 Oral     SpO2 10/02/22 1522 98 %     Weight --      Height --      Head Circumference --      Peak Flow --      Pain Score 10/02/22 1530 10     Pain Loc --      Pain Education --      Exclude from Growth Chart --    No data found.  Updated Vital Signs BP (!) 147/91 (BP Location: Right Arm)   Pulse (!) 104   Temp 98.7 F (37.1 C) (Oral)   Resp 18   SpO2 98%    Visual Acuity Right Eye Distance:   Left Eye Distance:   Bilateral Distance:    Right Eye Near:   Left Eye Near:    Bilateral Near:     Physical Exam Vitals and nursing note reviewed.  Constitutional:      General: She is not in acute distress.    Appearance: Normal appearance.  HENT:     Head: Normocephalic.  Eyes:     Extraocular Movements: Extraocular movements intact.     Pupils: Pupils are equal, round, and reactive to light.  Cardiovascular:     Rate and Rhythm: Regular rhythm. Tachycardia present.     Pulses: Normal pulses.     Heart sounds: Normal heart sounds.  Pulmonary:     Effort: Pulmonary effort is normal. No respiratory distress.     Breath sounds: Normal breath sounds. No stridor. No wheezing, rhonchi or rales.  Abdominal:     General: Bowel sounds are normal.     Palpations: Abdomen is soft.     Tenderness: There is no abdominal tenderness.  Musculoskeletal:     Cervical back: Normal range of motion.       Legs:  Skin:    General: Skin is warm and dry.  Neurological:     General: No focal deficit present.     Mental Status: She is alert and oriented to person, place, and time.  Psychiatric:        Mood and Affect: Mood normal.        Behavior: Behavior normal.      UC Treatments / Results  Labs (all labs ordered are listed, but only abnormal results are displayed) Labs Reviewed - No data to display  EKG   Radiology No results found.  Procedures Procedures (including critical care time)  Medications Ordered in UC Medications - No data to display  Initial Impression / Assessment and Plan / UC Course  I have reviewed the triage vital signs and the nursing notes.  Pertinent labs & imaging results that were available during my care of the patient were reviewed by me and considered in my medical decision making (see chart for details).  Symptoms  appear to be consistent with sciatica.  Decadron 10 mg IM and Toradol 30 mg IM  administered for pain and inflammation.  Will start patient on prednisone 40 mg for the next 5 days.  Supportive care recommendations were provided and discussed with the patient to include increasing fluids, allowing for plenty of rest, use of over-the-counter Tylenol, and gentle stretching exercises.  Patient was advised to follow-up with her PCP to determine where she was to go for physical therapy, advised patient to reschedule that appointment.  Also advised patient to follow-up with orthopedics if symptoms fail to improve with this treatment.  Patient was given strict ER follow-up precautions.  Patient is in agreement with this plan of care and verbalizes understanding.  All questions were answered.  Patient stable for discharge Final Clinical Impressions(s) / UC Diagnoses   Final diagnoses:  None   Discharge Instructions   None    ED Prescriptions   None    PDMP not reviewed this encounter.   Abran Cantor, NP 10/02/22 [phone]

## 2022-10-02 NOTE — Discharge Instructions (Addendum)
You were given an injection of Toradol 30 mg and Decadron 10 mg.  Do not take any additional ibuprofen today.  You may take Tylenol arthritis strength 650 mg tablets.  Take 1 tablet every 8 hours as needed for pain or discomfort. Take medication as prescribed. Try to remain as active as possible. Gentle range of motion and stretching exercises to help with back spasm and pain. May apply ice or heat as needed.  Ice is recommended for pain or swelling, heat for spasm or stiffness.  Apply for 20 minutes, remove for 1 hour, then repeat. May take over-the-counter Tylenol extra strength 500 mg tablet approximately 1 -2 hours after taking ibuprofen for breakthrough pain. Go to the emergency department immediately if you develop weakness in your legs or feet, inability to walk, loss of bowel or bladder function, difficulty urinating or passing a bowel movement, or other concerns. As discussed, it is recommended that you follow-up with your primary care physician to determine where you were referred to for physical therapy.  Also recommend that you follow-up with orthopedics. Follow-up as needed.

## 2022-10-02 NOTE — ED Triage Notes (Signed)
Pain in left lower hip/back that shots down to her knee and calf. That started a year ago.

## 2022-10-20 ENCOUNTER — Telehealth: Payer: Self-pay | Admitting: Family Medicine

## 2022-10-20 ENCOUNTER — Other Ambulatory Visit: Payer: Self-pay | Admitting: Family Medicine

## 2022-10-20 DIAGNOSIS — M25552 Pain in left hip: Secondary | ICD-10-CM

## 2022-10-20 MED ORDER — MELOXICAM 7.5 MG PO TABS
7.5000 mg | ORAL_TABLET | Freq: Two times a day (BID) | ORAL | 0 refills | Status: DC
Start: 2022-10-20 — End: 2023-03-29

## 2022-10-20 NOTE — Telephone Encounter (Signed)
Patient LVM says her hip is starting to flare up and is wanting a refill on meloxicam (MOBIC) 7.5 MG tablet [[id number]] & methocarbamol (ROBAXIN) 500 MG tablet [[id number]] Please advise  Thank you

## 2022-10-20 NOTE — Telephone Encounter (Signed)
Rx sent 

## 2022-12-11 ENCOUNTER — Encounter (INDEPENDENT_AMBULATORY_CARE_PROVIDER_SITE_OTHER): Payer: Self-pay | Admitting: *Deleted

## 2023-01-24 ENCOUNTER — Ambulatory Visit: Payer: Self-pay | Admitting: Family Medicine

## 2023-01-24 NOTE — Telephone Encounter (Signed)
 Pt requesting to be worked into Gloria's schedule if she agrees

## 2023-01-24 NOTE — Telephone Encounter (Signed)
 Copied from CRM [id number]. Topic: Clinical - Red Word Triage >> Jan 24, 2023  3:25 PM Star East wrote: Red Word that prompted transfer to Nurse Triage: Left hip pain is intolerable, left foot and arm tingling and numbness  Chief Complaint:left hip pain 9/10 Symptoms: Left hip pain (spine to left hip), left foot & arm numbness and tingling, confusion Frequency: worsening over last 6 months  Pertinent Negatives: Patient denies blurred vision, slurred speech, heavy limbs Disposition: [] ED /[] Urgent Care (no appt availability in office) / [x] Appointment(In office/virtual)/ []  [facility]/ [] Home Care/ [] Refused Recommended Disposition /[] [facility]/ []  Follow-up with PCP Additional Notes: patient would like to be evaluated by PCP - would like to be worked into schedule.   Stated in the past PCP told pt to take tylenol  and/or ibuprofen for pain: pt stated neither medication is working to ease pain and at times when numbness and tingling occurs she tends to drop things.  Reason for Disposition  [1] MILD pain (e.g., does not interfere with normal activities) AND [2] present > 7 days    Next available appt scheduled with provider w/n PCP office & message sent to PCP requesting to be worked into schedule sooner to address pt pain, numbness, tingling and confusion.  Pt these are ongoing issues that have gotten worse w/n last 6 months.  Answer Assessment - Initial Assessment Questions 1. ONSET: "When did the pain start?"      Last 6 months 2. LOCATION: "Where is the pain located?"      Left hip pain 3. PAIN: "How bad is the pain?"    (Scale 1-10; or mild, moderate, severe)   -  MILD (1-3): doesn't interfere with normal activities    -  MODERATE (4-7): interferes with normal activities (e.g., work or school) or awakens from sleep, limping    -  SEVERE (8-10): excruciating pain, unable to do any normal activities, unable to walk     9/10 4. WORK OR EXERCISE: "Has there been any  recent work or exercise that involved this part of the body?"      no 5. CAUSE: "What do you think is causing the leg pain?"     no 6. OTHER SYMPTOMS: "Do you have any other symptoms?" (e.g., chest pain, back pain, breathing difficulty, swelling, rash, fever, numbness, weakness)     Left hip pain (spine to left hip), left foot & arm numbness and tingling, confusion 7. PREGNANCY: "Is there any chance you are pregnant?" "When was your last menstrual period?"     N/a  Protocols used: Leg Pain-A-AH

## 2023-01-25 NOTE — Telephone Encounter (Signed)
Added to a waiting list for [facility]

## 2023-01-26 ENCOUNTER — Ambulatory Visit: Payer: Self-pay | Admitting: Family Medicine

## 2023-01-26 NOTE — Telephone Encounter (Signed)
   Chief Complaint: hip pain Symptoms: hurts to move Frequency: constant  Disposition: [] ED /[] Urgent Care (no appt availability in office) / [x] Appointment(In office/virtual)/ []  [facility]/ [] Home Care/ [] Refused Recommended Disposition /[] [facility]/ []  Follow-up with PCP Additional Notes: Pt complaining of left hip pain mostly but also experiencing left side swelling in leg, hip, foot. Pt has had these problems "for years" and they can't figure out why, per pt. Pt states she can't do daily activities. Pt lies in bed or sits and cries out in pain. Pt crying on phone in pain and wants relief. Per protocol pt needs to be seen today, but pt didn't have transportation. Pt has appt 1/20 with provider. RN gave care advice and pt verbalized understanding.              Copied from CRM [id number]. Topic: Clinical - Red Word Triage >> Jan 26, 2023  8:38 AM Carlatta H wrote: [facility] that prompted transfer to Nurse Triage: Patient has swelling on her left side that goes from her feet, legs, hip and hand// Numbness in Left foot and hand// Reason for Disposition  [1] MODERATE pain (e.g., interferes with normal activities, limping) AND [2] present > 3 days  Answer Assessment - Initial Assessment Questions 1. LOCATION and RADIATION: "Where is the pain located?"      Left hip 2. QUALITY: "What does the pain feel like?"  (e.g., sharp, dull, aching, burning)     Sharp, aching, can take breath away 3. SEVERITY: "How bad is the pain?" "What does it keep you from doing?"   (Scale 1-10; or mild, moderate, severe)   -  MILD (1-3): doesn't interfere with normal activities    -  MODERATE (4-7): interferes with normal activities (e.g., work or school) or awakens from sleep, limping    -  SEVERE (8-10): excruciating pain, unable to do any normal activities, unable to walk     severe 4. ONSET: "When did the pain start?" "Does it come and go, or is it there all the time?"     Years  ago 5. WORK OR EXERCISE: "Has there been any recent work or exercise that involved this part of the body?"      na 6. CAUSE: "What do you think is causing the hip pain?"      unsure 7. AGGRAVATING FACTORS: "What makes the hip pain worse?" (e.g., walking, climbing stairs, running)     Any ADLs 8. OTHER SYMPTOMS: "Do you have any other symptoms?" (e.g., back pain, pain shooting down leg,  fever, rash)     Left hand tingles, left side swelling  Protocols used: Hip Pain-A-AH

## 2023-01-29 ENCOUNTER — Ambulatory Visit: Payer: Self-pay | Admitting: Family Medicine

## 2023-02-16 ENCOUNTER — Ambulatory Visit: Payer: Self-pay | Admitting: Family Medicine

## 2023-03-29 ENCOUNTER — Encounter: Payer: Self-pay | Admitting: Family Medicine

## 2023-03-29 ENCOUNTER — Ambulatory Visit: Payer: Self-pay | Admitting: Family Medicine

## 2023-03-29 VITALS — BP 124/94 | HR 112 | Ht 62.0 in | Wt 124.1 lb

## 2023-03-29 DIAGNOSIS — E559 Vitamin D deficiency, unspecified: Secondary | ICD-10-CM

## 2023-03-29 DIAGNOSIS — K580 Irritable bowel syndrome with diarrhea: Secondary | ICD-10-CM | POA: Diagnosis not present

## 2023-03-29 DIAGNOSIS — E7849 Other hyperlipidemia: Secondary | ICD-10-CM | POA: Diagnosis not present

## 2023-03-29 DIAGNOSIS — E038 Other specified hypothyroidism: Secondary | ICD-10-CM | POA: Diagnosis not present

## 2023-03-29 DIAGNOSIS — K219 Gastro-esophageal reflux disease without esophagitis: Secondary | ICD-10-CM

## 2023-03-29 DIAGNOSIS — M25552 Pain in left hip: Secondary | ICD-10-CM

## 2023-03-29 DIAGNOSIS — R7301 Impaired fasting glucose: Secondary | ICD-10-CM | POA: Diagnosis not present

## 2023-03-29 DIAGNOSIS — R112 Nausea with vomiting, unspecified: Secondary | ICD-10-CM | POA: Diagnosis not present

## 2023-03-29 DIAGNOSIS — F1090 Alcohol use, unspecified, uncomplicated: Secondary | ICD-10-CM

## 2023-03-29 DIAGNOSIS — G629 Polyneuropathy, unspecified: Secondary | ICD-10-CM

## 2023-03-29 MED ORDER — MELOXICAM 15 MG PO TABS: 15.0000 mg | ORAL_TABLET | Freq: Every day | ORAL | 2 refills | Status: DC

## 2023-03-29 MED ORDER — GABAPENTIN 100 MG PO CAPS
100.0000 mg | ORAL_CAPSULE | Freq: Every day | ORAL | 3 refills | Status: DC
Start: 2023-03-29 — End: 2023-04-23

## 2023-03-29 MED ORDER — ONDANSETRON HCL 4 MG PO TABS: 4.0000 mg | ORAL_TABLET | Freq: Two times a day (BID) | ORAL | 0 refills | Status: DC | PRN

## 2023-03-29 MED ORDER — FOLIC ACID 1 MG PO TABS
1.0000 mg | ORAL_TABLET | Freq: Every day | ORAL | 1 refills | Status: DC
Start: 2023-03-29 — End: 2023-08-20

## 2023-03-29 MED ORDER — VITAMIN B-6 50 MG PO TABS
50.0000 mg | ORAL_TABLET | Freq: Every day | ORAL | 1 refills | Status: DC
Start: 2023-03-29 — End: 2023-08-05

## 2023-03-29 MED ORDER — THIAMINE HCL 100 MG PO TABS
100.0000 mg | ORAL_TABLET | Freq: Every day | ORAL | 1 refills | Status: DC
Start: 2023-03-29 — End: 2023-04-23

## 2023-03-29 MED ORDER — LOPERAMIDE HCL 2 MG PO TABS
2.0000 mg | ORAL_TABLET | Freq: Four times a day (QID) | ORAL | 0 refills | Status: DC | PRN
Start: 2023-03-29 — End: 2023-05-15

## 2023-03-29 MED ORDER — OMEPRAZOLE 40 MG PO CPDR
40.0000 mg | DELAYED_RELEASE_CAPSULE | Freq: Every day | ORAL | 3 refills | Status: DC
Start: 2023-03-29 — End: 2023-04-11

## 2023-03-29 NOTE — Progress Notes (Signed)
 Established Patient Office Visit  Subjective:  Patient ID: Joan Wood, female    DOB: 04-28-1970  Age: 53 y.o. MRN: [id number]  CC:  Chief Complaint  Patient presents with   Numbness    Pt. States numbess and tingling in left arm all the time and in left foot, Pt states its a couple of times daily that it occurs.    Hip Pain    Pt. States hip pain all the time for over a year, and was prescribed a anti inflammatory and said they worked for a week/ 2 weeks and they stopped working as well.     HPI Joan Wood is a 53 y.o. female presents with the above complaint in the chief complaints.  For the details of today's visit, please refer to the assessment and plan.    Past Medical History:  Diagnosis Date   GERD (gastroesophageal reflux disease)    Hernia of abdominal wall    HTN (hypertension)    SVT (supraventricular tachycardia) (HCC)     Past Surgical History:  Procedure Laterality Date   FINGER FRACTURE SURGERY     right ankle repair     age 58    Family History  Problem Relation Age of Onset   Cancer Mother    Leukemia Mother    Multiple myeloma Mother    Cancer Father        unsure what kind   Cancer - Colon Neg Hx    Colon polyps Neg Hx     Social History   Socioeconomic History   Marital status: Single    Spouse name: Not on file   Number of children: Not on file   Years of education: Not on file   Highest education level: Not on file  Occupational History   Not on file  Tobacco Use   Smoking status: Every Day    Current packs/day: 0.75    Average packs/day: 0.8 packs/day for 37.0 years (27.8 ttl pk-yrs)    Types: Cigarettes   Smokeless tobacco: Never  Vaping Use   Vaping status: Never Used  Substance and Sexual Activity   Alcohol use: Yes    Comment: beer, 4-6 /day   Drug use: Never   Sexual activity: Not Currently  Other Topics Concern   Not on file  Social History Narrative   Not on file   Social Drivers of Health   Financial  Resource Strain: Not on file  Food Insecurity: Not on file  Transportation Needs: Not on file  Physical Activity: Not on file  Stress: Not on file  Social Connections: Not on file  Intimate Partner Violence: Not on file    Outpatient Medications Prior to Visit  Medication Sig Dispense Refill   Cholecalciferol (VITAMIN D3) 25 MCG (1000 UT) CAPS Take 1 capsule (1,000 Units total) by mouth daily. 90 capsule 0   methocarbamol (ROBAXIN) 500 MG tablet Take 1 tablet (500 mg total) by mouth 2 (two) times daily as needed for muscle spasms. (Patient not taking: Reported on 03/29/2023) 20 tablet 0   meloxicam (MOBIC) 7.5 MG tablet Take 1 tablet (7.5 mg total) by mouth in the morning and at bedtime. (Patient not taking: Reported on 03/29/2023) 90 tablet 0   omeprazole (PRILOSEC) 40 MG capsule Take 1 capsule (40 mg total) by mouth daily. (Patient not taking: Reported on 03/29/2023) 30 capsule 2   ondansetron (ZOFRAN) 4 MG tablet Take 1 tablet (4 mg total) by mouth 2 (two) times  daily as needed for nausea or vomiting. (Patient not taking: Reported on 03/29/2023) 20 tablet 0   No facility-administered medications prior to visit.    Allergies  Allergen Reactions   Bee Venom     ROS Review of Systems  Constitutional:  Negative for chills and fever.  Eyes:  Negative for visual disturbance.  Respiratory:  Negative for chest tightness and shortness of breath.   Musculoskeletal:  Positive for arthralgias.  Neurological:  Positive for numbness. Negative for dizziness and headaches.      Objective:    Physical Exam HENT:     Head: Normocephalic.     Mouth/Throat:     Mouth: Mucous membranes are moist.  Cardiovascular:     Rate and Rhythm: Normal rate.     Heart sounds: Normal heart sounds.  Pulmonary:     Effort: Pulmonary effort is normal.     Breath sounds: Normal breath sounds.  Neurological:     Mental Status: She is alert.     BP (!) 124/94 (BP Location: Right Arm, Patient Position:  Sitting, Cuff Size: Normal)   Pulse (!) 112   Ht 5\' 2"  (1.575 m)   Wt 124 lb 1.9 oz (56.3 kg)   SpO2 98%   BMI 22.70 kg/m  Wt Readings from Last 3 Encounters:  03/29/23 124 lb 1.9 oz (56.3 kg)  06/02/22 120 lb (54.4 kg)  05/22/22 123 lb (55.8 kg)    Lab Results  Component Value Date   TSH 3.470 03/28/2022   Lab Results  Component Value Date   WBC 5.4 03/28/2022   HGB 13.8 03/28/2022   HCT 39.0 03/28/2022   MCV 95 03/28/2022   PLT 142 (L) 03/28/2022   Lab Results  Component Value Date   NA 133 (L) 03/28/2022   K 4.8 03/28/2022   CO2 21 03/28/2022   GLUCOSE 79 03/28/2022   BUN 7 03/28/2022   CREATININE 0.55 (L) 03/28/2022   BILITOT 0.5 03/28/2022   ALKPHOS 104 03/28/2022   AST 77 (H) 03/28/2022   ALT 28 03/28/2022   PROT 8.4 03/28/2022   ALBUMIN 4.5 03/28/2022   CALCIUM 9.9 03/28/2022   ANIONGAP 10 08/14/2020   EGFR 110 03/28/2022   Lab Results  Component Value Date   CHOL 206 (H) 03/28/2022   Lab Results  Component Value Date   HDL 103 03/28/2022   Lab Results  Component Value Date   LDLCALC 86 03/28/2022   Lab Results  Component Value Date   TRIG 99 03/28/2022   Lab Results  Component Value Date   CHOLHDL 2.0 03/28/2022   Lab Results  Component Value Date   HGBA1C 4.9 03/28/2022      Assessment & Plan:  Irritable bowel syndrome with diarrhea Assessment & Plan: The patient reports having diarrhea with every meal. No mucus or pus is noted. She does report seeing some blood in her stool but attributes this to her hemorrhoids. She was diagnosed with IBS 5-7 years ago and has previously followed up with GI. GI notes were reviewed, and the patient was recommended to undergo an upper endoscopy and colonoscopy for further evaluation. However, she did not complete the recommended procedures due to loss of insurance. She is now requesting a referral to GI to resume specialty care.  Encouraged to start taking Loperamide 2 mg - Take 45 minutes before  every meal to help reduce diarrhea. Zofran (Ondansetron) 4 mg twice daily as needed for nausea and vomiting. Dietary Suggestions: Try a low-FODMAP  diet to reduce IBS symptoms. Increase soluble fiber (oats, bananas, cooked vegetables) to help regulate stools. Avoid artificial sweeteners, dairy, and excessive caffeine, which can worsen diarrhea.        Orders: -     Loperamide HCl; Take 1 tablet (2 mg total) by mouth 4 (four) times daily as needed for diarrhea or loose stools.  Dispense: 30 tablet; Refill: 0 -     Ambulatory referral to Gastroenterology  Neuropathy Assessment & Plan: Start taking Gabapentin 100 mg at bedtime to relieve numbness and tingling in the hands and left foot. Pyridoxine (Vitamin B6) 50 mg daily for neuropathy support.  Orders: -     Gabapentin; Take 1 capsule (100 mg total) by mouth at bedtime.  Dispense: 30 capsule; Refill: 3 -     Vitamin B-6; Take 1 tablet (50 mg total) by mouth daily.  Dispense: 30 tablet; Refill: 1  Alcohol use disorder Assessment & Plan: She reports cutting back on her drinking, stating that she now consumes 2-3 beers daily, and notes that a 24-ounce beer can last her a week. Alcohol cessation is strongly recommended. Avoid alcohol while taking medications, as it can increase side effects and reduce effectiveness. Supplements Prescribed: Folic Acid 1 mg daily - Supports overall health and reduces alcohol-related deficiencies. Thiamine (Vitamin B1) 100 mg daily - Prevents Wernicke's encephalopathy, a serious neurological condition linked to alcohol use. Support Options:Consider support groups (AA or counseling) for assistance with alcohol cessation. Stay hydrated and maintain balanced nutrition during withdrawal or reduction in alcohol intake.   Orders: -     Folic Acid; Take 1 tablet (1 mg total) by mouth daily.  Dispense: 60 tablet; Refill: 1 -     Thiamine HCl; Take 1 tablet (100 mg total) by mouth daily.  Dispense: 60 tablet;  Refill: 1  Gastroesophageal reflux disease without esophagitis Assessment & Plan: Encouraged to start taking Omeprazole 40 mg daily to help relieve symptoms of heartburn, indigestion, and epigastric pain. Lifestyle Recommendations: Avoid spicy, acidic, and fatty foods that trigger reflux. Elevate the head of the bed while sleeping. Do not eat close to bedtime (at least 2-3 hours before sleep). Limit caffeine, alcohol, and carbonated drinks.  Orders: -     Omeprazole; Take 1 capsule (40 mg total) by mouth daily.  Dispense: 30 capsule; Refill: 3 -     Ambulatory referral to Gastroenterology  Left hip pain Assessment & Plan: Encouraged to start taking Meloxicam 15 mg daily - A nonsteroidal anti-inflammatory drug (NSAID) to help relieve pain and inflammation. Additional Pain Management Strategies: Apply heat or cold therapy to the hip as needed. Engage in gentle stretching and low-impact exercises (e.g., swimming, walking).   Orders: -     Meloxicam; Take 1 tablet (15 mg total) by mouth daily.  Dispense: 30 tablet; Refill: 2 -     Ambulatory referral to Orthopedic Surgery  Nausea and vomiting, unspecified vomiting type Assessment & Plan: Refill sent  Orders: -     Ondansetron HCl; Take 1 tablet (4 mg total) by mouth 2 (two) times daily as needed for nausea or vomiting.  Dispense: 20 tablet; Refill: 0  IFG (impaired fasting glucose) -     Hemoglobin A1c  Vitamin D deficiency -     VITAMIN D 25 Hydroxy (Vit-D Deficiency, Fractures)  TSH (thyroid-stimulating hormone deficiency) -     TSH + free T4  Other hyperlipidemia -     Lipid panel -     CMP14+EGFR -  CBC with Differential/Platelet  Note: This chart has been completed using Engineer, civil (consulting) software, and while attempts have been made to ensure accuracy, certain words and phrases may not be transcribed as intended.    Follow-up: Return in about 4 months (around 07/29/2023).   Gilmore Laroche, FNP

## 2023-03-29 NOTE — Patient Instructions (Addendum)
 I appreciate the opportunity to provide care to you today!    Follow up:  4 months  Labs: please stop by the lab today/ during the week to get your blood drawn (CBC, CMP, TSH, Lipid profile, HgA1c, Vit D)   Gastroesophageal Reflux Disease (GERD) Start taking Omeprazole 40 mg daily to help relieve symptoms of heartburn, indigestion, and epigastric pain. Lifestyle Recommendations: Avoid spicy, acidic, and fatty foods that trigger reflux. Elevate the head of the bed while sleeping. Do not eat close to bedtime (at least 2-3 hours before sleep). Limit caffeine, alcohol, and carbonated drinks. Irritable Bowel Syndrome - Diarrhea Predominant (IBS-D) Start taking Loperamide 2 mg - Take 45 minutes before every meal to help reduce diarrhea. Zofran (Ondansetron) 4 mg twice daily as needed for nausea and vomiting. Dietary Suggestions: Try a low-FODMAP diet to reduce IBS symptoms. Increase soluble fiber (oats, bananas, cooked vegetables) to help regulate stools. Avoid artificial sweeteners, dairy, and excessive caffeine, which can worsen diarrhea. Neuropathy Start taking Gabapentin 100 mg at bedtime to relieve numbness and tingling in the hands and left foot. Pyridoxine (Vitamin B6) 50 mg daily for neuropathy support. Alcohol Use Alcohol cessation is strongly recommended. Avoid alcohol while taking medications, as it can increase side effects and reduce effectiveness. Supplements Prescribed: Folic Acid 1 mg daily - Supports overall health and reduces alcohol-related deficiencies. Thiamine (Vitamin B1) 100 mg daily - Prevents Wernicke's encephalopathy, a serious neurological condition linked to alcohol use. Support Options:Consider support groups (AA or counseling) for assistance with alcohol cessation. Stay hydrated and maintain balanced nutrition during withdrawal or reduction in alcohol intake. Left Hip Pain Start taking Meloxicam 15 mg daily - A nonsteroidal anti-inflammatory drug (NSAID) to  help relieve pain and inflammation. Additional Pain Management Strategies: Apply heat or cold therapy to the hip as needed. Engage in gentle stretching and low-impact exercises (e.g., swimming, walking).  Referrals today-orthopedic surgery, and G.I.    Please continue to a heart-healthy diet and increase your physical activities. Try to exercise for at least five days a week.    It was a pleasure to see you and I look forward to continuing to work together on your health and well-being. Please do not hesitate to call the office if you need care or have questions about your care.  In case of emergency, please visit the Emergency Department for urgent care, or contact our clinic at [phone] to schedule an appointment. We're here to help you!   Have a wonderful day and week. With Gratitude, Gilmore Laroche MSN, FNP-BC

## 2023-03-31 DIAGNOSIS — K219 Gastro-esophageal reflux disease without esophagitis: Secondary | ICD-10-CM | POA: Insufficient documentation

## 2023-03-31 DIAGNOSIS — R197 Diarrhea, unspecified: Secondary | ICD-10-CM | POA: Insufficient documentation

## 2023-03-31 DIAGNOSIS — R112 Nausea with vomiting, unspecified: Secondary | ICD-10-CM | POA: Insufficient documentation

## 2023-03-31 DIAGNOSIS — G629 Polyneuropathy, unspecified: Secondary | ICD-10-CM | POA: Insufficient documentation

## 2023-03-31 DIAGNOSIS — K580 Irritable bowel syndrome with diarrhea: Secondary | ICD-10-CM | POA: Insufficient documentation

## 2023-03-31 NOTE — Assessment & Plan Note (Signed)
 Refill sent.

## 2023-03-31 NOTE — Assessment & Plan Note (Addendum)
 She reports cutting back on her drinking, stating that she now consumes 2-3 beers daily, and notes that a 24-ounce beer can last her a week. Alcohol cessation is strongly recommended. Avoid alcohol while taking medications, as it can increase side effects and reduce effectiveness. Supplements Prescribed: Folic Acid 1 mg daily - Supports overall health and reduces alcohol-related deficiencies. Thiamine (Vitamin B1) 100 mg daily - Prevents Wernicke's encephalopathy, a serious neurological condition linked to alcohol use. Support Options:Consider support groups (AA or counseling) for assistance with alcohol cessation. Stay hydrated and maintain balanced nutrition during withdrawal or reduction in alcohol intake.

## 2023-03-31 NOTE — Assessment & Plan Note (Signed)
 Encouraged to start taking Omeprazole 40 mg daily to help relieve symptoms of heartburn, indigestion, and epigastric pain. Lifestyle Recommendations: Avoid spicy, acidic, and fatty foods that trigger reflux. Elevate the head of the bed while sleeping. Do not eat close to bedtime (at least 2-3 hours before sleep). Limit caffeine, alcohol, and carbonated drinks.

## 2023-03-31 NOTE — Assessment & Plan Note (Signed)
 The patient reports having diarrhea with every meal. No mucus or pus is noted. She does report seeing some blood in her stool but attributes this to her hemorrhoids. She was diagnosed with IBS 5-7 years ago and has previously followed up with GI. GI notes were reviewed, and the patient was recommended to undergo an upper endoscopy and colonoscopy for further evaluation. However, she did not complete the recommended procedures due to loss of insurance. She is now requesting a referral to GI to resume specialty care.  Encouraged to start taking Loperamide 2 mg - Take 45 minutes before every meal to help reduce diarrhea. Zofran (Ondansetron) 4 mg twice daily as needed for nausea and vomiting. Dietary Suggestions: Try a low-FODMAP diet to reduce IBS symptoms. Increase soluble fiber (oats, bananas, cooked vegetables) to help regulate stools. Avoid artificial sweeteners, dairy, and excessive caffeine, which can worsen diarrhea.

## 2023-03-31 NOTE — Assessment & Plan Note (Addendum)
 Encouraged to start taking Meloxicam 15 mg daily - A nonsteroidal anti-inflammatory drug (NSAID) to help relieve pain and inflammation. Additional Pain Management Strategies: Apply heat or cold therapy to the hip as needed. Engage in gentle stretching and low-impact exercises (e.g., swimming, walking).

## 2023-03-31 NOTE — Assessment & Plan Note (Signed)
 Start taking Gabapentin 100 mg at bedtime to relieve numbness and tingling in the hands and left foot. Pyridoxine (Vitamin B6) 50 mg daily for neuropathy support.

## 2023-04-02 ENCOUNTER — Ambulatory Visit: Payer: Self-pay | Admitting: Family Medicine

## 2023-04-03 DIAGNOSIS — E559 Vitamin D deficiency, unspecified: Secondary | ICD-10-CM | POA: Diagnosis not present

## 2023-04-03 DIAGNOSIS — E038 Other specified hypothyroidism: Secondary | ICD-10-CM | POA: Diagnosis not present

## 2023-04-03 DIAGNOSIS — R7301 Impaired fasting glucose: Secondary | ICD-10-CM | POA: Diagnosis not present

## 2023-04-03 DIAGNOSIS — E7849 Other hyperlipidemia: Secondary | ICD-10-CM | POA: Diagnosis not present

## 2023-04-04 LAB — CMP14+EGFR
ALT: 49 IU/L — ABNORMAL HIGH (ref 0–32)
AST: 156 IU/L — ABNORMAL HIGH (ref 0–40)
Albumin: 4.2 g/dL (ref 3.8–4.9)
Alkaline Phosphatase: 117 IU/L (ref 44–121)
BUN/Creatinine Ratio: 10 (ref 9–23)
BUN: 5 mg/dL — ABNORMAL LOW (ref 6–24)
Bilirubin Total: 0.7 mg/dL (ref 0.0–1.2)
CO2: 22 mmol/L (ref 20–29)
Calcium: 9.4 mg/dL (ref 8.7–10.2)
Chloride: 93 mmol/L — ABNORMAL LOW (ref 96–106)
Creatinine, Ser: 0.52 mg/dL — ABNORMAL LOW (ref 0.57–1.00)
Globulin, Total: 3.8 g/dL (ref 1.5–4.5)
Glucose: 81 mg/dL (ref 70–99)
Potassium: 4.1 mmol/L (ref 3.5–5.2)
Sodium: 131 mmol/L — ABNORMAL LOW (ref 134–144)
Total Protein: 8 g/dL (ref 6.0–8.5)
eGFR: 111 mL/min/{1.73_m2} (ref >59)

## 2023-04-04 LAB — CBC WITH DIFFERENTIAL/PLATELET
Basophils Absolute: 0 10*3/uL (ref 0.0–0.2)
Basos: 1 %
EOS (ABSOLUTE): 0 10*3/uL (ref 0.0–0.4)
Eos: 1 %
Hematocrit: 35.3 % (ref 34.0–46.6)
Hemoglobin: 12.6 g/dL (ref 11.1–15.9)
Immature Grans (Abs): 0 10*3/uL (ref 0.0–0.1)
Immature Granulocytes: 0 %
Lymphocytes Absolute: 1.7 10*3/uL (ref 0.7–3.1)
Lymphs: 39 %
MCH: 35 pg — ABNORMAL HIGH (ref 26.6–33.0)
MCHC: 35.7 g/dL (ref 31.5–35.7)
MCV: 98 fL — ABNORMAL HIGH (ref 79–97)
Monocytes Absolute: 0.7 10*3/uL (ref 0.1–0.9)
Monocytes: 15 %
Neutrophils Absolute: 1.9 10*3/uL (ref 1.4–7.0)
Neutrophils: 44 %
Platelets: 122 10*3/uL — ABNORMAL LOW (ref 150–450)
RBC: 3.6 x10E6/uL — ABNORMAL LOW (ref 3.77–5.28)
RDW: 11.8 % (ref 11.7–15.4)
WBC: 4.2 10*3/uL (ref 3.4–10.8)

## 2023-04-04 LAB — TSH+FREE T4
Free T4: 0.72 ng/dL — ABNORMAL LOW (ref 0.82–1.77)
TSH: 3.15 u[IU]/mL (ref 0.450–4.500)

## 2023-04-04 LAB — LIPID PANEL
Chol/HDL Ratio: 2 ratio (ref 0.0–4.4)
Cholesterol, Total: 177 mg/dL (ref 100–199)
HDL: 88 mg/dL (ref >39)
LDL Chol Calc (NIH): 75 mg/dL (ref 0–99)
Triglycerides: 73 mg/dL (ref 0–149)
VLDL Cholesterol Cal: 14 mg/dL (ref 5–40)

## 2023-04-04 LAB — HEMOGLOBIN A1C
Est. average glucose Bld gHb Est-mCnc: 88 mg/dL
Hgb A1c MFr Bld: 4.7 % — ABNORMAL LOW (ref 4.8–5.6)

## 2023-04-04 LAB — VITAMIN D 25 HYDROXY (VIT D DEFICIENCY, FRACTURES): Vit D, 25-Hydroxy: 21.7 ng/mL — ABNORMAL LOW (ref 30.0–100.0)

## 2023-04-05 ENCOUNTER — Telehealth: Payer: Self-pay | Admitting: Family Medicine

## 2023-04-05 NOTE — Telephone Encounter (Signed)
 Copied from CRM [id number]. Topic: Clinical - Medication Question >> Apr 05, 2023  8:28 AM Desma Mcgregor wrote: Reason for CRM: Patient was prescribed the following meds:  folic acid (FOLVITE) 1 MG tablet,  ondansetron (ZOFRAN) 4 MG tablet gabapentin (NEURONTIN) 100 MG capsule loperamide (IMODIUM A-D) 2 MG tablet omeprazole (PRILOSEC) 40 MG capsule meloxicam (MOBIC) 15 MG tablet  Patient is asking if she is able to take these meds at the same time? She says she is a little scared but needs to take them. Please contact patient back to discuss.

## 2023-04-05 NOTE — Telephone Encounter (Signed)
 Yes, that's fine

## 2023-04-06 NOTE — Telephone Encounter (Signed)
 Copied from CRM [id number]. Topic: Clinical - Medication Question >> Apr 05, 2023  8:28 AM Desma Mcgregor wrote: Reason for CRM: Patient was prescribed the following meds:  folic acid (FOLVITE) 1 MG tablet,  ondansetron (ZOFRAN) 4 MG tablet gabapentin (NEURONTIN) 100 MG capsule loperamide (IMODIUM A-D) 2 MG tablet omeprazole (PRILOSEC) 40 MG capsule meloxicam (MOBIC) 15 MG tablet  Patient is asking if she is able to take these meds at the same time? She says she is a little scared but needs to take them. Please contact patient back to discuss.

## 2023-04-06 NOTE — Telephone Encounter (Signed)
 Left detailed vm letting pt know it is okay to take all medications.

## 2023-04-06 NOTE — Telephone Encounter (Signed)
 yes

## 2023-04-10 ENCOUNTER — Encounter: Payer: Self-pay | Admitting: Orthopedic Surgery

## 2023-04-10 ENCOUNTER — Ambulatory Visit: Admitting: Orthopedic Surgery

## 2023-04-10 ENCOUNTER — Other Ambulatory Visit (INDEPENDENT_AMBULATORY_CARE_PROVIDER_SITE_OTHER): Payer: Self-pay

## 2023-04-10 VITALS — BP 125/82 | HR 116

## 2023-04-10 DIAGNOSIS — M545 Low back pain, unspecified: Secondary | ICD-10-CM

## 2023-04-10 DIAGNOSIS — M4316 Spondylolisthesis, lumbar region: Secondary | ICD-10-CM | POA: Diagnosis not present

## 2023-04-10 NOTE — Progress Notes (Signed)
 New Patient Visit  Assessment: Joan Wood is a 53 y.o. female with the following: 1. Spondylolisthesis, lumbar region  Plan: Joan Wood has left-sided lower back pain, which radiates into the left buttock, and distally.  When she stands for a few minutes, her symptoms get worse.  She has no symptoms when she is laying down.  Radiographs obtained today demonstrates grade 2 anterolisthesis at L4-5.  She is already taking meloxicam, and has been prescribed gabapentin.  She has been referred to physical therapy, but has not started.  At this point, I would like for her to be evaluated by Dr. Christell Constant.  A referral has been placed.  She will return to clinic as needed.  Follow-up: Return for Referral to Dr. Christell Constant.  Subjective:  Chief Complaint  Patient presents with   Back Pain    L side LBP going into the thigh for 1 yr.     History of Present Illness: Joan Wood is a 53 y.o. female who has been referred by Gilmore Laroche, FNP for evaluation of low back pain.  She states that she has pain in the "left hip".  This pain has been in the upper portion of the left buttock, and the left side of her lower back for over a year.  No specific injury.  She has no pain when she is laying down.  Pain gets worse when she is seated.  Pain gets much worse, and progressively worsens when she is standing.  She notes pain radiating distally.  She has occasional numbness and tingling in the left foot.  She also has some numbness and tingling into the left hand.  She has recently started taking meloxicam.  She has been prescribed gabapentin.  She has not started any physical therapy.   Review of Systems: No fevers or chills + numbness or tingling No chest pain No shortness of breath No bowel or bladder dysfunction No GI distress No headaches + nausea + diarrhea   Medical History:  Past Medical History:  Diagnosis Date   GERD (gastroesophageal reflux disease)    Hernia of abdominal wall     HTN (hypertension)    SVT (supraventricular tachycardia) (HCC)     Past Surgical History:  Procedure Laterality Date   FINGER FRACTURE SURGERY     right ankle repair     age 63    Family History  Problem Relation Age of Onset   Cancer Mother    Leukemia Mother    Multiple myeloma Mother    Cancer Father        unsure what kind   Cancer - Colon Neg Hx    Colon polyps Neg Hx    Social History   Tobacco Use   Smoking status: Every Day    Current packs/day: 0.75    Average packs/day: 0.8 packs/day for 37.0 years (27.8 ttl pk-yrs)    Types: Cigarettes   Smokeless tobacco: Never  Vaping Use   Vaping status: Never Used  Substance Use Topics   Alcohol use: Yes    Comment: beer, 4-6 /day   Drug use: Never    Allergies  Allergen Reactions   Bee Venom     Current Meds  Medication Sig   folic acid (FOLVITE) 1 MG tablet Take 1 tablet (1 mg total) by mouth daily.   gabapentin (NEURONTIN) 100 MG capsule Take 1 capsule (100 mg total) by mouth at bedtime.   loperamide (IMODIUM A-D) 2 MG tablet Take 1 tablet (  2 mg total) by mouth 4 (four) times daily as needed for diarrhea or loose stools.   meloxicam (MOBIC) 15 MG tablet Take 1 tablet (15 mg total) by mouth daily.   omeprazole (PRILOSEC) 40 MG capsule Take 1 capsule (40 mg total) by mouth daily.   ondansetron (ZOFRAN) 4 MG tablet Take 1 tablet (4 mg total) by mouth 2 (two) times daily as needed for nausea or vomiting.    Objective: BP 125/82   Pulse (!) 116   Physical Exam:  General: Alert and oriented. and No acute distress. Gait: Ambulates with the assistance of a cane.  There is palpation along the lower back, and the left buttock.  Positive straight leg raise on the left.  Sensation is intact throughout the left leg.  Slightly decreased sensation to left foot.  She has atrophy of the lower extremity.  She struggles to straighten the left leg.   IMAGING: I personally ordered and reviewed the following  images  Standing lumbar spine x-rays were obtained in clinic today.  No acute injuries are noted.  Well-maintained disc height.  At L4-5 there is grade 2 anterolisthesis.  No bony lesions.  Small osteophytes are appreciated.  Impression: Grade 2 anterolisthesis at L4-5   New Medications:  No orders of the defined types were placed in this encounter.     Oliver Barre, MD  04/10/2023 4:15 PM

## 2023-04-11 ENCOUNTER — Telehealth: Payer: Self-pay | Admitting: *Deleted

## 2023-04-11 ENCOUNTER — Ambulatory Visit: Admitting: Gastroenterology

## 2023-04-11 ENCOUNTER — Encounter: Payer: Self-pay | Admitting: Gastroenterology

## 2023-04-11 VITALS — BP 154/95 | HR 92 | Temp 97.3°F | Ht 62.0 in | Wt 127.7 lb

## 2023-04-11 DIAGNOSIS — R7989 Other specified abnormal findings of blood chemistry: Secondary | ICD-10-CM | POA: Diagnosis not present

## 2023-04-11 DIAGNOSIS — R1084 Generalized abdominal pain: Secondary | ICD-10-CM

## 2023-04-11 DIAGNOSIS — K766 Portal hypertension: Secondary | ICD-10-CM

## 2023-04-11 DIAGNOSIS — K580 Irritable bowel syndrome with diarrhea: Secondary | ICD-10-CM | POA: Diagnosis not present

## 2023-04-11 DIAGNOSIS — K219 Gastro-esophageal reflux disease without esophagitis: Secondary | ICD-10-CM | POA: Diagnosis not present

## 2023-04-11 DIAGNOSIS — R112 Nausea with vomiting, unspecified: Secondary | ICD-10-CM | POA: Diagnosis not present

## 2023-04-11 DIAGNOSIS — K769 Liver disease, unspecified: Secondary | ICD-10-CM

## 2023-04-11 DIAGNOSIS — K429 Umbilical hernia without obstruction or gangrene: Secondary | ICD-10-CM | POA: Diagnosis not present

## 2023-04-11 DIAGNOSIS — R197 Diarrhea, unspecified: Secondary | ICD-10-CM | POA: Diagnosis not present

## 2023-04-11 DIAGNOSIS — K649 Unspecified hemorrhoids: Secondary | ICD-10-CM | POA: Diagnosis not present

## 2023-04-11 DIAGNOSIS — K625 Hemorrhage of anus and rectum: Secondary | ICD-10-CM

## 2023-04-11 DIAGNOSIS — Z860100 Personal history of colon polyps, unspecified: Secondary | ICD-10-CM

## 2023-04-11 MED ORDER — OMEPRAZOLE 40 MG PO CPDR: 40.0000 mg | DELAYED_RELEASE_CAPSULE | Freq: Every day | ORAL | 3 refills | Status: DC

## 2023-04-11 NOTE — Telephone Encounter (Signed)
 LMOVM to return call  MRI scheduled for Saturday, 04/14/23, arrive at 8:15 am to check in, NPO 4 hours prior  CT scheduled for Wednesday, 04/18/23, arrive at 9:45 am to check in.  TCS/EGD, asa 3, w/Dr.Carver

## 2023-04-11 NOTE — Patient Instructions (Addendum)
 Please have blood work and stool studies completed at [facility].  We will call you with results once they have been received. Please allow 3-5 business days for review. 2 locations for [facility] in [place]:              1. [address], [place]              2. [address] [place]   We will get you scheduled for a CT scan of your abdomen and pelvis with contrast as well as an MRI dedicated to your liver.  I am going to get a CT scan instead of ultrasound to evaluate your abdominal pain given the amount of tenderness you are having on your abdomen and to evaluate your hernia for surgery if needed.  Please start taking omeprazole 40 mg nightly before bed.  Continue Zofran as needed for nausea and vomiting.  I want you to take a half of an Imodium prior to bed to see if this helps with the morning diarrhea.  I want to encourage you to try to follow a high-protein diet and a low-fat diet.  Even though you do not have much of an appetite to eat, protein intake is very important if there is any present liver disease.  We will also get you scheduled for an upper endoscopy and colonoscopy to evaluate your upper abdominal pain, vomiting, and a colonoscopy to evaluate your rectal bleeding, reassess for colon polyps, and assess for any potential cause of your diarrhea.  Lifestyle Recommendations:  High-protein diet from a primarily plant-based diet. Avoid red meat.  No raw or undercooked meat, seafood, or shellfish. Low-fat/cholesterol/carbohydrate diet. Limit sodium to no more than 2000 mg/day including everything that you eat and drink. Recommend at least 30 minutes of aerobic and resistance exercise 3 days/week. Limit Tylenol to 2000 mg daily.   Congratulations on limiting your alcohol intake however I would strongly advise complete cessation of alcohol use as this can lead to worsening liver disease and could be contributing to your diarrhea and vomiting.  Had flu vaccine,  Tdap and shingles done in December 2024.   Follow-up after procedures.  It was a pleasure to see you today. I want to create trusting relationships with patients. If you receive a survey regarding your visit,  I greatly appreciate you taking time to fill this out on paper or through your MyChart. I value your feedback.  Brooke Bonito, MSN, FNP-BC, AGACNP-BC [facility]

## 2023-04-11 NOTE — Progress Notes (Signed)
 GI Office Note    Referring Provider: Gilmore Laroche, FNP Primary Care Physician:  Gilmore Laroche, FNP Primary Gastroenterologist: Hennie Duos. Marletta Lor, DO  Date:  04/11/2023  ID:  Joan Wood, DOB [birth year], MRN [id number]   Chief Complaint   Chief Complaint  Patient presents with   Irritable Bowel Syndrome    Patient here today for a follow up on IBS w Diarrhea. She says she is having diarrhea, and vomiting most morning.   History of Present Illness  Joan Wood is a 53 y.o. female with a history of alcohol abuse, GERD, HTN, SVT, periumbilical hernia, and IBS with diarrhea presenting today with complaint of IBS with diarrhea and vomiting.  CT A/P in August 2022: -Extensive inflammatory change involving single loop of small bowel in the right lower quadrant (infectious or inflammatory enteritis) -Moderate hepatic steatosis and mild hepatomegaly -Evidence of portal venous hypertension (recanalization of umbilical vein) -Recommended hepatic elastography for further management -Indeterminate low-attenuation lesion within the right hepatic lobe (advised MRI if no comparison with prior examination available)   Labs 03/28/22: Cr 0.55, Na 133, Albumin 4.5, Tbili 0.5, Alk Phos 104, AST 77, ALT 28, Plts 142, Hgb 13.8   Seen recently by general surgery for consideration of umbilical hernia repair given tenderness on exam she was noted to be drinking 4-6 beers daily.  She was noted to be high risk for surgical interventions given possibility of cirrhosis given her chronic alcohol use therefore she was recommended to see GI for management of liver disease.  She was advised that she was not a good candidate for surgical intervention at this time and that her liver disease needed to be better optimized and when she is optimized she may would benefit from intervention at tertiary care facility.  Last office visit 05/01/22.  Reported daily generalized abdominal pain, slightly increased in the  upper abdomen.  Have been having ongoing abdominal distention.  Denied peripheral edema.  Having 5-6 stools with loose.  Symptoms ongoing once a day.  Drinking 4-6 daily, twice a year she may drink 8.  Denies liquor or wine.  Denies jaundice or pruritus.  Does admit to being forgetful but denied any overt confusion or disorientation.  Having nausea and diarrhea on a daily basis as well as acid reflux.  Also lots of burping along with her epigastric pain.  Symptoms ongoing for about a year.  Has a poor appetite and early satiety but denied any weight loss.  History of rectal bleeding for majority of the month, history of hemorrhoids.  Denied any active rectal pain at the time.  Smokes half pack per day.  Reported a colonoscopy in her 30s with benign polyps performed at [facility].  Advised omeprazole 40 mg once daily, 30 minutes prior to breakfast.  GERD diet discussed.  Recommended folic acid and thiamine supplements.  Ordered EGD and colonoscopy.  Advised to continue Zofran as needed.  MRI liver recommended given prior liver lesion noted on CT in 2022.  Advised to check labs including autoimmune serologies, iron panel, and viral hepatitis panel given concern for cirrhosis. 3 month follow up recommended.   We attempted to call her for procedures as well as MRI, leaving messages but never received a call back.  MRI was not performed.  Procedures were not scheduled.  Recently seen by PCP 3/20 with reports of diarrhea with every meal.  Also previously reported seeing blood in stools.  Currently requesting GI referral.  Also reported some reflux symptoms, she  is encouraged to start taking her omeprazole 40 mg once daily.  Counseled on GERD lifestyle modifications.  Given Zofran to help with nausea and vomiting     Latest Ref Rng & Units 04/03/2023   10:41 AM 03/28/2022    9:36 AM 08/14/2020   12:19 PM  CMP  Glucose 70 - 99 mg/dL 81  79  89   BUN 6 - 24 mg/dL 5  7  <5   Creatinine 9.62 - 1.00 mg/dL 9.52  8.41   3.24   Sodium 134 - 144 mmol/L 131  133  123   Potassium 3.5 - 5.2 mmol/L 4.1  4.8  3.3   Chloride 96 - 106 mmol/L 93  94  89   CO2 20 - 29 mmol/L 22  21  24    Calcium 8.7 - 10.2 mg/dL 9.4  9.9  8.0   Total Protein 6.0 - 8.5 g/dL 8.0  8.4  7.7   Total Bilirubin 0.0 - 1.2 mg/dL 0.7  0.5  0.8   Alkaline Phos 44 - 121 IU/L 117  104  80   AST 0 - 40 IU/L 156  77  138   ALT 0 - 32 IU/L 49  28  47      Today:  Diarrhea - Most morning she is vomiting and/or diarrhea. Has had accidents because of the urgency. Has been happening more frequent recently. Not having diarrhea throughout he day or after meals.   For many years she reports being anorexic (80s). But now food looks good and she wants to eat but does not eat because she feels like the vomiting and diarrhea make her not want to eat. Even crackers or bread she may only take a few bites and that is all. Does not believe she is taking omeprazole.   Reports having large hemorrhoids and on her bad days she will have bleeding. This occurs about 1-2 times a week ad bleeding lasts for an hour and is after the BM usually. She believes her hemorrhoids are external.   She believes the vomiting and diarrhea are about 50/50 on how often it occurs.   She is still smoking  and sometimes vomiting comes out of nowhere and does not really have nausea. She will have repeated episodes of vomiting when it occurs. Has imodium at home and has taken this maybe once per day.   She has had some shortness of breath related to her ventral hernia that she has. She feels more pressure at times. Recently she has been unable to tell if she just has gas or if she is going to have stool.   Per review of chart she has received Shingrix, flu vaccine, and Tdap vaccine in Dec 2024.  Tobacco use -  Alcohol intake - 2-3 beers/daily. Uses this to help with sleep. No liquor or alcohol  No black stools.   Wt Readings from Last 3 Encounters:  04/11/23 127 lb 11.2 oz (57.9 kg)   03/29/23 124 lb 1.9 oz (56.3 kg)  06/02/22 120 lb (54.4 kg)    Current Outpatient Medications  Medication Sig Dispense Refill   Cholecalciferol (VITAMIN D3) 25 MCG (1000 UT) CAPS Take 1 capsule (1,000 Units total) by mouth daily. 90 capsule 0   folic acid (FOLVITE) 1 MG tablet Take 1 tablet (1 mg total) by mouth daily. 60 tablet 1   gabapentin (NEURONTIN) 100 MG capsule Take 1 capsule (100 mg total) by mouth at bedtime. 30 capsule 3   loperamide (  IMODIUM A-D) 2 MG tablet Take 1 tablet (2 mg total) by mouth 4 (four) times daily as needed for diarrhea or loose stools. 30 tablet 0   meloxicam (MOBIC) 15 MG tablet Take 1 tablet (15 mg total) by mouth daily. 30 tablet 2   methocarbamol (ROBAXIN) 500 MG tablet Take 1 tablet (500 mg total) by mouth 2 (two) times daily as needed for muscle spasms. 20 tablet 0   omeprazole (PRILOSEC) 40 MG capsule Take 1 capsule (40 mg total) by mouth daily. 30 capsule 3   ondansetron (ZOFRAN) 4 MG tablet Take 1 tablet (4 mg total) by mouth 2 (two) times daily as needed for nausea or vomiting. 20 tablet 0   pyridOXINE (VITAMIN B6) 50 MG tablet Take 1 tablet (50 mg total) by mouth daily. 30 tablet 1   thiamine (VITAMIN B1) 100 MG tablet Take 1 tablet (100 mg total) by mouth daily. 60 tablet 1   No current facility-administered medications for this visit.    Past Medical History:  Diagnosis Date   GERD (gastroesophageal reflux disease)    Hernia of abdominal wall    HTN (hypertension)    SVT (supraventricular tachycardia) (HCC)     Past Surgical History:  Procedure Laterality Date   FINGER FRACTURE SURGERY     right ankle repair     age 51    Family History  Problem Relation Age of Onset   Cancer Mother    Leukemia Mother    Multiple myeloma Mother    Cancer Father        unsure what kind   Cancer - Colon Neg Hx    Colon polyps Neg Hx     Allergies as of 04/11/2023 - Review Complete 04/11/2023  Allergen Reaction Noted   Bee venom  08/14/2020     Social History   Socioeconomic History   Marital status: Single    Spouse name: Not on file   Number of children: Not on file   Years of education: Not on file   Highest education level: Not on file  Occupational History   Not on file  Tobacco Use   Smoking status: Every Day    Current packs/day: 0.75    Average packs/day: 0.8 packs/day for 37.0 years (27.8 ttl pk-yrs)    Types: Cigarettes   Smokeless tobacco: Never  Vaping Use   Vaping status: Never Used  Substance and Sexual Activity   Alcohol use: Yes    Comment: beer, 4-6 /day   Drug use: Never   Sexual activity: Not Currently  Other Topics Concern   Not on file  Social History Narrative   Not on file   Social Drivers of Health   Financial Resource Strain: Not on file  Food Insecurity: Not on file  Transportation Needs: Not on file  Physical Activity: Not on file  Stress: Not on file  Social Connections: Not on file   Review of Systems   Gen: Denies fever, chills, anorexia. Denies fatigue, weakness, weight loss.  CV: Denies chest pain, palpitations, syncope, peripheral edema, and claudication. Resp: Denies dyspnea at rest, cough, wheezing, coughing up blood, and pleurisy. GI: See HPI Derm: Denies rash, itching, dry skin Psych: Denies depression, anxiety, memory loss, confusion. No homicidal or suicidal ideation.  Heme: Denies bruising, bleeding, and enlarged lymph nodes. + joint pain and weakness. Physical Exam   BP (!) 154/95 (BP Location: Left Arm, Patient Position: Sitting, Cuff Size: Normal)   Pulse 92   Temp Marland Kitchen)  97.3 F (36.3 C) (Temporal)   Ht 5\' 2"  (1.575 m)   Wt 127 lb 11.2 oz (57.9 kg)   BMI 23.36 kg/m   General:   Alert and oriented. No distress noted. Pleasant and cooperative.  Head:  Normocephalic and atraumatic. Eyes:  Conjuctiva clear without scleral icterus. Mouth:  Oral mucosa pink and moist. Good dentition. No lesions. Lungs:  Clear to auscultation bilaterally. No wheezes,  rales, or rhonchi. No distress.  Heart:  S1, S2 present without murmurs appreciated.  Abdomen:  +BS, soft. Mild distention.  TTP throughout abdomen, worsening over site of hernia.  No rebound or guarding. No HSM or masses noted. Rectal: deferred Msk:  Symmetrical without gross deformities. Normal posture. Extremities:  Without edema. Neurologic:  Alert and  oriented x4 Psych:  Alert and cooperative. Normal mood and affect.  Assessment  Joan Wood is a 53 y.o. female with a history of alcohol abuse, GERD, HTN, SVT, periumbilical hernia, and IBS with diarrhea presenting today with complaint of IBS with diarrhea and vomiting.  Possible early cirrhosis, hepatic steatosis, thrombocytopenia, liver lesion : -Previous concern for cirrhosis given CT evidence in 2022 with portal hypertension and hepatic steatosis.  Also with concern for liver lesion at the time. -Most recent LFTs last week with elevated AST and ALT, consistent with alcohol use.  Normal bili and alk phos.  Platelets low at 122. -Unable to calculate MELD score given no INR on file. -Need to assess viral hepatitis panel as well as autoimmune serologies -MRI liver needed to better characterize prior liver lesion noted on previous CT.  Will reorder today -Continues to consume alcohol: 2-3 beers/daily; reinforced need for cessation -Will assess for viral hepatitis as well as autoimmune serologies as previously planned -Given thrombocytopenia and previous concerns for portal hypertension, will assess for varices on upper endoscopy.  GERD, nausea/vomiting: -Does not believe she is currently taking omeprazole therefore advised to begin 40 mg once daily 30 minutes prior to breakfast. -Having morning vomiting that can occur at random.  Denies any regular nausea.  Zofran is helpful and will continue for now -Recommended alcohol cessation -GERD diet recommended -Will evaluate further with EGD to assess for gastritis, duodenitis, esophagitis,  peptic ulcer disease, H. pylori.  Denies any dysphagia.  Hemorrhoids, rectal bleeding, history of colon polyps: -Reports history of chronic hemorrhoids that she believes is all external -Bleeding usually only occurring 1-2 times a week and may last for about an hour. -Denies any significant rectal pain -Last colonoscopy in her 30s, patient reported benign polyps.  Records unavailable.  Diarrhea: -Previously reported to be after meals -Today reports that loose stools have been most common in the mornings and is associated with abdominal pain. -Does not eat regular meals -Suspect this could be secondary to alcohol use, unable to rule out pancreatic insufficiency given chronic alcohol use as well, will check fecal elastase.  Can consider trial of Creon or Zenpep in the future if needed -For now given symptoms are currently only in the morning, advised half Imodium at night.  Hold if constipated -Currently due for colonoscopy given symptoms above in history of colon polyps therefore can evaluate for microscopic colitis as well on colonoscopy.  Periumbilical hernia: - Present on exam today, soft and reduces but with rebound. -Reports this hernia has been causing increased shortness of breath although on palpation today this is very small, about the size of a golf ball -Tenderness to palpation today -Will refer to general surgery  PLAN  Proceed with upper endoscopy and colonoscopy with propofol by Dr. Marletta Lor in near future: the risks, benefits, and alternatives have been discussed with the patient in detail. The patient states understanding and desires to proceed.  ASA 3 MRI liver CT A/P with contrast.  Viral hepatitis panel, ANA, ASMA, AMA, IgG, iron panel, INR, AFP Continue folic acid and thiamine supplementation.  Fecal elastase Trial of Creon or Zenpep if colonoscopy negative.  Imodium as needed.   Cirrhosis diet, outlined in AVS Reinforced need for alcohol cessation Zofran as needed  for vomiting Omeprazole 40 mg nightly.  GERD diet General surgery referral    Brooke Bonito, MSN, FNP-BC, AGACNP-BC [facility]

## 2023-04-11 NOTE — Telephone Encounter (Signed)
 [place] PA for CT: Approval # [id number] Approval valid through: 04/11/23-06/09/23  PA for MRI: approval # [id number] Approval valid through: 04/11/23-06/09/23

## 2023-04-11 NOTE — Progress Notes (Deleted)
 GI Office Note    Referring Provider: Gilmore Laroche, FNP Primary Care Physician:  Gilmore Laroche, FNP Primary Gastroenterologist: Hennie Duos. Marletta Lor, DO  Date:  04/11/2023  ID:  Joan Wood, DOB March 19, 1970, MRN [id number]   Chief Complaint   No chief complaint on file.   History of Present Illness  Joan Wood is a 53 y.o. female with a history of *** presenting today with complaint of     Last office visit 05/01/22.   Recently seen by PCP 3/20 with reports of diarrhea with every meal.  Also previously reported seeing blood in stools.  Currently requesting GI referral.  Also reported some reflux symptoms, she is encouraged to start taking her omeprazole 40 mg once daily.  Counseled on GERD lifestyle modifications.  Given Zofran to help with nausea and vomiting  Today:    Wt Readings from Last 3 Encounters:  03/29/23 124 lb 1.9 oz (56.3 kg)  06/02/22 120 lb (54.4 kg)  05/22/22 123 lb (55.8 kg)    Current Outpatient Medications  Medication Sig Dispense Refill   Cholecalciferol (VITAMIN D3) 25 MCG (1000 UT) CAPS Take 1 capsule (1,000 Units total) by mouth daily. 90 capsule 0   folic acid (FOLVITE) 1 MG tablet Take 1 tablet (1 mg total) by mouth daily. 60 tablet 1   gabapentin (NEURONTIN) 100 MG capsule Take 1 capsule (100 mg total) by mouth at bedtime. 30 capsule 3   loperamide (IMODIUM A-D) 2 MG tablet Take 1 tablet (2 mg total) by mouth 4 (four) times daily as needed for diarrhea or loose stools. 30 tablet 0   meloxicam (MOBIC) 15 MG tablet Take 1 tablet (15 mg total) by mouth daily. 30 tablet 2   methocarbamol (ROBAXIN) 500 MG tablet Take 1 tablet (500 mg total) by mouth 2 (two) times daily as needed for muscle spasms. (Patient not taking: Reported on 03/29/2023) 20 tablet 0   omeprazole (PRILOSEC) 40 MG capsule Take 1 capsule (40 mg total) by mouth daily. 30 capsule 3   ondansetron (ZOFRAN) 4 MG tablet Take 1 tablet (4 mg total) by mouth 2 (two) times daily as needed  for nausea or vomiting. 20 tablet 0   pyridOXINE (VITAMIN B6) 50 MG tablet Take 1 tablet (50 mg total) by mouth daily. 30 tablet 1   thiamine (VITAMIN B1) 100 MG tablet Take 1 tablet (100 mg total) by mouth daily. 60 tablet 1   No current facility-administered medications for this visit.    Past Medical History:  Diagnosis Date   GERD (gastroesophageal reflux disease)    Hernia of abdominal wall    HTN (hypertension)    SVT (supraventricular tachycardia) (HCC)     Past Surgical History:  Procedure Laterality Date   FINGER FRACTURE SURGERY     right ankle repair     age 61    Family History  Problem Relation Age of Onset   Cancer Mother    Leukemia Mother    Multiple myeloma Mother    Cancer Father        unsure what kind   Cancer - Colon Neg Hx    Colon polyps Neg Hx     Allergies as of 04/11/2023 - Review Complete 04/10/2023  Allergen Reaction Noted   Bee venom  08/14/2020    Social History   Socioeconomic History   Marital status: Single    Spouse name: Not on file   Number of children: Not on file   Years of  education: Not on file   Highest education level: Not on file  Occupational History   Not on file  Tobacco Use   Smoking status: Every Day    Current packs/day: 0.75    Average packs/day: 0.8 packs/day for 37.0 years (27.8 ttl pk-yrs)    Types: Cigarettes   Smokeless tobacco: Never  Vaping Use   Vaping status: Never Used  Substance and Sexual Activity   Alcohol use: Yes    Comment: beer, 4-6 /day   Drug use: Never   Sexual activity: Not Currently  Other Topics Concern   Not on file  Social History Narrative   Not on file   Social Drivers of Health   Financial Resource Strain: Not on file  Food Insecurity: Not on file  Transportation Needs: Not on file  Physical Activity: Not on file  Stress: Not on file  Social Connections: Not on file     Review of Systems   Gen: Denies fever, chills, anorexia. Denies fatigue, weakness, weight  loss.  CV: Denies chest pain, palpitations, syncope, peripheral edema, and claudication. Resp: Denies dyspnea at rest, cough, wheezing, coughing up blood, and pleurisy. GI: See HPI Derm: Denies rash, itching, dry skin Psych: Denies depression, anxiety, memory loss, confusion. No homicidal or suicidal ideation.  Heme: Denies bruising, bleeding, and enlarged lymph nodes.  Physical Exam   There were no vitals taken for this visit.  General:   Alert and oriented. No distress noted. Pleasant and cooperative.  Head:  Normocephalic and atraumatic. Eyes:  Conjuctiva clear without scleral icterus. Mouth:  Oral mucosa pink and moist. Good dentition. No lesions. Lungs:  Clear to auscultation bilaterally. No wheezes, rales, or rhonchi. No distress.  Heart:  S1, S2 present without murmurs appreciated.  Abdomen:  +BS, soft, non-tender and non-distended. No rebound or guarding. No HSM or masses noted. Rectal: *** Msk:  Symmetrical without gross deformities. Normal posture. Extremities:  Without edema. Neurologic:  Alert and  oriented x4 Psych:  Alert and cooperative. Normal mood and affect.  Assessment  Joan Wood is a 53 y.o. female with a history of *** presenting today with    PLAN   ***     Brooke Bonito, MSN, FNP-BC, AGACNP-BC [facility]

## 2023-04-12 LAB — IRON,TIBC AND FERRITIN PANEL
Ferritin: 165 ng/mL — ABNORMAL HIGH (ref 15–150)
Iron Saturation: 20 % (ref 15–55)
Iron: 73 ug/dL (ref 27–159)
Total Iron Binding Capacity: 373 ug/dL (ref 250–450)
UIBC: 300 ug/dL (ref 131–425)

## 2023-04-12 LAB — COMPREHENSIVE METABOLIC PANEL WITH GFR
ALT: 40 IU/L — ABNORMAL HIGH (ref 0–32)
AST: 131 IU/L — ABNORMAL HIGH (ref 0–40)
Albumin: 4.1 g/dL (ref 3.8–4.9)
Alkaline Phosphatase: 101 IU/L (ref 44–121)
BUN/Creatinine Ratio: 16 (ref 9–23)
BUN: 7 mg/dL (ref 6–24)
Bilirubin Total: 0.6 mg/dL (ref 0.0–1.2)
CO2: 21 mmol/L (ref 20–29)
Calcium: 9.1 mg/dL (ref 8.7–10.2)
Chloride: 97 mmol/L (ref 96–106)
Creatinine, Ser: 0.45 mg/dL — ABNORMAL LOW (ref 0.57–1.00)
Globulin, Total: 4.2 g/dL (ref 1.5–4.5)
Glucose: 83 mg/dL (ref 70–99)
Potassium: 4.4 mmol/L (ref 3.5–5.2)
Sodium: 133 mmol/L — ABNORMAL LOW (ref 134–144)
Total Protein: 8.3 g/dL (ref 6.0–8.5)
eGFR: 115 mL/min/{1.73_m2} (ref >59)

## 2023-04-12 LAB — CBC
Hematocrit: 36.1 % (ref 34.0–46.6)
Hemoglobin: 13.2 g/dL (ref 11.1–15.9)
MCH: 35.5 pg — ABNORMAL HIGH (ref 26.6–33.0)
MCHC: 36.6 g/dL — ABNORMAL HIGH (ref 31.5–35.7)
MCV: 97 fL (ref 79–97)
Platelets: 107 10*3/uL — ABNORMAL LOW (ref 150–450)
RBC: 3.72 x10E6/uL — ABNORMAL LOW (ref 3.77–5.28)
RDW: 11.7 % (ref 11.7–15.4)
WBC: 4.3 10*3/uL (ref 3.4–10.8)

## 2023-04-12 LAB — HEPATITIS A ANTIBODY, TOTAL: hep A Total Ab: NEGATIVE

## 2023-04-12 LAB — HEPATITIS B SURFACE ANTIBODY,QUALITATIVE: Hep B Surface Ab, Qual: NONREACTIVE

## 2023-04-12 LAB — IGG: IgG (Immunoglobin G), Serum: 2067 mg/dL — ABNORMAL HIGH (ref 586–1602)

## 2023-04-12 LAB — PROTIME-INR
INR: 1.1 (ref 0.9–1.2)
Prothrombin Time: 12.4 s — ABNORMAL HIGH (ref 9.1–12.0)

## 2023-04-12 LAB — HEPATITIS C ANTIBODY: Hep C Virus Ab: NONREACTIVE

## 2023-04-12 LAB — HEPATITIS A ANTIBODY, IGM: Hep A IgM: NEGATIVE

## 2023-04-12 LAB — MITOCHONDRIAL ANTIBODIES: Mitochondrial Ab: <20 U (ref 0.0–20.0)

## 2023-04-12 LAB — ANTI-SMOOTH MUSCLE ANTIBODY, IGG: Smooth Muscle Ab: 8 U (ref 0–19)

## 2023-04-12 LAB — AFP TUMOR MARKER: AFP, Serum, Tumor Marker: 9 ng/mL (ref 0.0–9.2)

## 2023-04-12 LAB — ANA W/REFLEX IF POSITIVE: Anti Nuclear Antibody (ANA): NEGATIVE

## 2023-04-12 LAB — HEPATITIS B SURFACE ANTIGEN: Hepatitis B Surface Ag: NEGATIVE

## 2023-04-12 LAB — HEPATITIS B CORE ANTIBODY, TOTAL: Hep B Core Total Ab: NEGATIVE

## 2023-04-12 NOTE — Telephone Encounter (Signed)
 LMOVM to return call.

## 2023-04-13 ENCOUNTER — Telehealth: Payer: Self-pay | Admitting: Family Medicine

## 2023-04-13 NOTE — Telephone Encounter (Signed)
 Copied from CRM [id number]. Topic: Clinical - Medical Advice >> Apr 13, 2023 10:05 AM Joan Wood wrote: Reason for CRM: Patient is calling because she is wanting to know after the appointment on 04/14/23 and 04/18/23 can schedule her hernia appointment surgery since she is having difficulty eating and going about her life

## 2023-04-14 ENCOUNTER — Ambulatory Visit (HOSPITAL_COMMUNITY)
Admission: RE | Admit: 2023-04-14 | Discharge: 2023-04-14 | Disposition: A | Discharge status: Home / Self Care | Source: Ambulatory Visit | Attending: Gastroenterology | Admitting: Gastroenterology

## 2023-04-14 DIAGNOSIS — K769 Liver disease, unspecified: Secondary | ICD-10-CM | POA: Insufficient documentation

## 2023-04-14 DIAGNOSIS — R160 Hepatomegaly, not elsewhere classified: Secondary | ICD-10-CM | POA: Diagnosis not present

## 2023-04-14 DIAGNOSIS — K7689 Other specified diseases of liver: Secondary | ICD-10-CM | POA: Diagnosis not present

## 2023-04-14 DIAGNOSIS — R109 Unspecified abdominal pain: Secondary | ICD-10-CM | POA: Diagnosis not present

## 2023-04-14 MED ORDER — GADOBUTROL 1 MMOL/ML IV SOLN
6.0000 mL | Freq: Once | INTRAVENOUS | Status: AC | PRN
  Administered 2023-04-14: 6 mL via INTRAVENOUS

## 2023-04-15 ENCOUNTER — Encounter (HOSPITAL_COMMUNITY): Payer: Self-pay | Admitting: Emergency Medicine

## 2023-04-15 ENCOUNTER — Other Ambulatory Visit: Payer: Self-pay

## 2023-04-15 ENCOUNTER — Emergency Department (HOSPITAL_COMMUNITY)

## 2023-04-15 ENCOUNTER — Other Ambulatory Visit: Payer: Self-pay | Admitting: Family Medicine

## 2023-04-15 ENCOUNTER — Emergency Department (HOSPITAL_COMMUNITY)
Admission: EM | Admit: 2023-04-15 | Discharge: 2023-04-15 | Disposition: A | Discharge status: Home / Self Care | Attending: Emergency Medicine | Admitting: Emergency Medicine

## 2023-04-15 DIAGNOSIS — S92334A Nondisplaced fracture of third metatarsal bone, right foot, initial encounter for closed fracture: Secondary | ICD-10-CM | POA: Insufficient documentation

## 2023-04-15 DIAGNOSIS — S92324A Nondisplaced fracture of second metatarsal bone, right foot, initial encounter for closed fracture: Secondary | ICD-10-CM | POA: Diagnosis not present

## 2023-04-15 DIAGNOSIS — I10 Essential (primary) hypertension: Secondary | ICD-10-CM | POA: Diagnosis not present

## 2023-04-15 DIAGNOSIS — W010XXA Fall on same level from slipping, tripping and stumbling without subsequent striking against object, initial encounter: Secondary | ICD-10-CM | POA: Diagnosis not present

## 2023-04-15 DIAGNOSIS — E038 Other specified hypothyroidism: Secondary | ICD-10-CM

## 2023-04-15 DIAGNOSIS — S92354A Nondisplaced fracture of fifth metatarsal bone, right foot, initial encounter for closed fracture: Secondary | ICD-10-CM | POA: Diagnosis not present

## 2023-04-15 DIAGNOSIS — E559 Vitamin D deficiency, unspecified: Secondary | ICD-10-CM

## 2023-04-15 DIAGNOSIS — M85871 Other specified disorders of bone density and structure, right ankle and foot: Secondary | ICD-10-CM | POA: Diagnosis not present

## 2023-04-15 DIAGNOSIS — S99921A Unspecified injury of right foot, initial encounter: Secondary | ICD-10-CM | POA: Diagnosis present

## 2023-04-15 DIAGNOSIS — S92301A Fracture of unspecified metatarsal bone(s), right foot, initial encounter for closed fracture: Secondary | ICD-10-CM

## 2023-04-15 DIAGNOSIS — S92344A Nondisplaced fracture of fourth metatarsal bone, right foot, initial encounter for closed fracture: Secondary | ICD-10-CM | POA: Diagnosis not present

## 2023-04-15 MED ORDER — HYDROCODONE-ACETAMINOPHEN 5-325 MG PO TABS
1.0000 | ORAL_TABLET | Freq: Once | ORAL | Status: AC
  Administered 2023-04-15: 1 via ORAL
  Filled 2023-04-15: qty 1

## 2023-04-15 MED ORDER — VITAMIN D (ERGOCALCIFEROL) 1.25 MG (50000 UNIT) PO CAPS: 50000.0000 [IU] | ORAL_CAPSULE | ORAL | 1 refills | Status: DC

## 2023-04-15 MED ORDER — HYDROCODONE-ACETAMINOPHEN 5-325 MG PO TABS: 1.0000 | ORAL_TABLET | Freq: Four times a day (QID) | ORAL | 0 refills | Status: DC | PRN

## 2023-04-15 NOTE — Discharge Instructions (Signed)
 You were seen in the emergency room today for foot fracture.  Do not put weight on this foot.  Wear cast.  Take 1000 mg of Tylenol every 6 hours for pain.  You can take ibuprofen every 8 hours.  I have sent Norco to take as needed for breakthrough pain.

## 2023-04-15 NOTE — ED Triage Notes (Signed)
 Pt to ER via EMS from home where she slipped on a hardwood floor falling.  Pt with pain to right foot/ankle and skin tear to left elbow.  Pt was given fentanyl by EMS.

## 2023-04-15 NOTE — ED Provider Notes (Signed)
 [facility] EMERGENCY DEPARTMENT AT [facility] Provider Note   CSN: [id number] Arrival date & time: 04/15/23  1739     History {Add pertinent medical, surgical, social history, OB history to HPI:1} Chief Complaint  Patient presents with   Ankle Pain    Joan Wood is a 53 y.o. female.  With past medical history of alcohol use disorder, left hip pain, hernia of abdominal wall, hypertension, GERD presenting to emergency room after fall.  Patient reports that she was taking a nap when she got up to walk to the bathroom.  She was wearing socks and symptom the hardwood floor.  She fell landing on her elbows and as she was going down twisted her right ankle.  Patient reports that she thinks she fell with inversion type mechanism.  She is able to move her toes but it hurts significantly.  She was given fentanyl by EMS and reports significant improvement in symptoms.  She feels like she cannot bear weight because the pain is bad.  Denies any other associated injuries during the fall.  She did not hit her head no loss of consciousness.  She is not on blood thinner.   Ankle Pain      Home Medications Prior to Admission medications   Medication Sig Start Date End Date Taking? Authorizing Provider  Cholecalciferol (VITAMIN D3) 25 MCG (1000 UT) CAPS Take 1 capsule (1,000 Units total) by mouth daily. 05/02/22   Gilmore Laroche, FNP  folic acid (FOLVITE) 1 MG tablet Take 1 tablet (1 mg total) by mouth daily. 03/29/23   Gilmore Laroche, FNP  gabapentin (NEURONTIN) 100 MG capsule Take 1 capsule (100 mg total) by mouth at bedtime. 03/29/23   Gilmore Laroche, FNP  loperamide (IMODIUM A-D) 2 MG tablet Take 1 tablet (2 mg total) by mouth 4 (four) times daily as needed for diarrhea or loose stools. 03/29/23   Gilmore Laroche, FNP  meloxicam (MOBIC) 15 MG tablet Take 1 tablet (15 mg total) by mouth daily. 03/29/23   Gilmore Laroche, FNP  methocarbamol (ROBAXIN) 500 MG tablet Take 1 tablet (500 mg  total) by mouth 2 (two) times daily as needed for muscle spasms. 06/20/22   Gilmore Laroche, FNP  omeprazole (PRILOSEC) 40 MG capsule Take 1 capsule (40 mg total) by mouth daily. 04/11/23   Aida Raider, NP  ondansetron (ZOFRAN) 4 MG tablet Take 1 tablet (4 mg total) by mouth 2 (two) times daily as needed for nausea or vomiting. 03/29/23   Gilmore Laroche, FNP  pyridOXINE (VITAMIN B6) 50 MG tablet Take 1 tablet (50 mg total) by mouth daily. 03/29/23   Gilmore Laroche, FNP  thiamine (VITAMIN B1) 100 MG tablet Take 1 tablet (100 mg total) by mouth daily. 03/29/23   Gilmore Laroche, FNP      Allergies    Bee venom    Review of Systems   Review of Systems  Musculoskeletal:  Positive for arthralgias.    Physical Exam Updated Vital Signs BP (!) 145/90   Pulse 93   Temp 97.9 F (36.6 C) (Oral)   Resp 18   Ht 5\' 2"  (1.575 m)   Wt 56.7 kg   SpO2 99%   BMI 22.86 kg/m  Physical Exam Vitals and nursing note reviewed.  Constitutional:      General: She is not in acute distress.    Appearance: She is not toxic-appearing.  HENT:     Head: Normocephalic and atraumatic.  Eyes:     General: No  scleral icterus.    Conjunctiva/sclera: Conjunctivae normal.  Cardiovascular:     Rate and Rhythm: Normal rate and regular rhythm.     Pulses: Normal pulses.     Heart sounds: Normal heart sounds.  Pulmonary:     Effort: Pulmonary effort is normal. No respiratory distress.     Breath sounds: Normal breath sounds.  Abdominal:     General: Abdomen is flat. Bowel sounds are normal.     Palpations: Abdomen is soft.     Tenderness: There is no abdominal tenderness.  Musculoskeletal:     Right lower leg: No edema.     Left lower leg: No edema.     Comments: Tenderness to palpation over fifth metatarsal as well as lateral malleolus.  There is very mild swelling and no obvious deformity.  She is neurovascularly intact.  Skin:    General: Skin is warm and dry.     Findings: No lesion.  Neurological:      General: No focal deficit present.     Mental Status: She is alert and oriented to person, place, and time. Mental status is at baseline.     ED Results / Procedures / Treatments   Labs (all labs ordered are listed, but only abnormal results are displayed) Labs Reviewed - No data to display  EKG None  Radiology MR LIVER W WO CONTRAST Result Date: 04/15/2023 CLINICAL DATA:  Abdominal pain. Further evaluation of indeterminate lesion in the right lobe of the liver. EXAM: MRI ABDOMEN WITHOUT AND WITH CONTRAST TECHNIQUE: Multiplanar multisequence MR imaging of the abdomen was performed both before and after the administration of intravenous contrast. CONTRAST:  6mL GADAVIST GADOBUTROL 1 MMOL/ML IV SOLN COMPARISON:  CT August 14, 2020. FINDINGS: Lower chest: No acute abnormality. Hepatobiliary: Enlarged liver measuring 22.1 cm in maximum craniocaudal dimension. Widening of the fissures with nodular hepatic contour. Unchanged size of the well-circumscribed T2 hyperintense 11 mm lesion in the inferior right lobe of the liver on image 23/4 demonstrates discontinuous peripheral nodular enhancement which increases peripherally over subsequent postcontrast pulse sequences. No new suspicious hepatic lesion. Gallbladder is unremarkable.  No biliary ductal dilation. Pancreas: No pancreatic ductal dilation or evidence of acute inflammation. Spleen:  No splenomegaly. Adrenals/Urinary Tract: Bilateral adrenal glands appear normal. No hydronephrosis. No suspicious renal mass. Stomach/Bowel: Visualized portions within the abdomen are unremarkable. Vascular/Lymphatic: No pathologically enlarged lymph nodes identified. No abdominal aortic aneurysm demonstrated. Recannulized paraumbilical vein. Other:  None. Musculoskeletal: No suspicious bone lesions identified. IMPRESSION: 1. 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 2. 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 No new suspicious hepatic lesion. Electronically Signed   By: Maudry Mayhew M.D.   On: 04/15/2023 08:10    Procedures Procedures  {Document cardiac monitor, telemetry assessment procedure when appropriate:1}  Medications Ordered in ED Medications  HYDROcodone-acetaminophen (NORCO/VICODIN) 5-325 MG per tablet 1 tablet (has no administration in time range)    ED Course/ Medical Decision Making/ A&P   {   Click here for ABCD2, HEART and other calculatorsREFRESH Note before signing :1}                              Medical Decision Making Amount and/or Complexity of Data Reviewed  Radiology: ordered.  Risk Prescription drug management.   This patient presents to the ED for concern of right ankle pain, this involves an extensive number of treatment options, and is a complaint that carries with it a high risk of complications and morbidity.  The differential diagnosis includes ***  Imaging Studies ordered:  I ordered imaging studies including ***  I independently visualized and interpreted imaging which showed *** I agree with the radiologist interpretation   Problem List / ED Course / Critical interventions / Medication management  *** I ordered medication including ***  for ***  Reevaluation of the patient after these medicines showed that the patient {resolved/improved/worsened:23923::"improved"} I have reviewed the patients home medicines and have made adjustments as needed   Plan  F/u w/ PCP in 2-3d to ensure resolution of sx.  Patient was given return precautions. Patient stable for discharge at this time.  Patient educated on sx/dx and verbalized understanding of plan. Return to ER w/ new or worsening sx.     {Document critical care time when appropriate:1} {Document review of labs and clinical decision tools ie heart score, Chads2Vasc2 etc:1}  {Document your independent review of radiology images, and any outside records:1} {Document your discussion with family members, caretakers, and with consultants:1} {Document social determinants of health affecting pt's care:1} {Document your decision making why or why not admission, treatments were needed:1} Final Clinical Impression(s) / ED Diagnoses Final diagnoses:  None    Rx / DC Orders ED Discharge Orders     None

## 2023-04-16 ENCOUNTER — Ambulatory Visit: Payer: Self-pay | Admitting: Family Medicine

## 2023-04-16 NOTE — Telephone Encounter (Signed)
 Encouraged to follow up with GI first

## 2023-04-16 NOTE — Telephone Encounter (Signed)
 Copied from CRM [id number]. Topic: Clinical - Red Word Triage >> Apr 16, 2023 11:47 AM Franchot Heidelberg wrote: Red Word that prompted transfer to Nurse Triage: Severe pain, wants to discuss lab results   Chief Complaint: Foot pain; HFU Symptoms: Pain Frequency: constant Pertinent Negatives: Patient denies chest pain Disposition: [] ED /[] Urgent Care (no appt availability in office) / [] Appointment(In office/virtual)/ []  [place] [facility]/ [] Home Care/ [] Refused Recommended Disposition /[]  Mobile Bus/ []  Follow-up with PCP Additional Notes: Patient fell yesterday, seen and treated in ED. Diagnosed with multiple fractures and placed in a cast. Reports discomfort in her feet and that her toe is turning blue. EMS called to assess. Pt scheduled for 4/9 but requested an ortho referral, will send request to office Reason for Disposition  Purple or black skin on foot or toe  Answer Assessment - Initial Assessment Questions 1. ONSET: "When did the pain start?"      Yesterday after a fall  2. LOCATION: "Where is the pain located?"      Right foot  3. PAIN: "How bad is the pain?"    (Scale 1-10; or mild, moderate, severe)  - MILD (1-3): doesn't interfere with normal activities.   - MODERATE (4-7): interferes with normal activities (e.g., work or school) or awakens from sleep, limping.   - SEVERE (8-10): excruciating pain, unable to do any normal activities, unable to walk.      Severe pain  4. WORK OR EXERCISE: "Has there been any recent work or exercise that involved this part of the body?"      No  5. CAUSE: "What do you think is causing the foot pain?"     Slip and fall at home, confirmed fracture  6. OTHER SYMPTOMS: "Do you have any other symptoms?" (e.g., leg pain, rash, fever, numbness)     No  7. PREGNANCY: "Is there any chance you are pregnant?" "When was your last menstrual period?"     No  Protocols used: Foot Pain-A-AH

## 2023-04-17 ENCOUNTER — Ambulatory Visit: Payer: Self-pay

## 2023-04-17 ENCOUNTER — Encounter: Payer: Self-pay | Admitting: Gastroenterology

## 2023-04-17 NOTE — Telephone Encounter (Signed)
   Chief Complaint: foot pain Symptoms: right foot pain, tingling Frequency: a few days Pertinent Negatives: Patient denies blue discoloration Disposition: [] ED /[] Urgent Care (no appt availability in office) / [x] Appointment(In office/virtual)/ []  [place] [facility]/ [] Home Care/ [x] Refused Recommended Disposition /[] [place] Mobile [facility]/ []  Follow-up with PCP Additional Notes: Patient was seen in ED 04/15/23 and was diagnosed with multiple fractures in her right foot. Patient reports she has been taking the pain medication prescribed and she is still experiencing severe pain and tingling in her toes. Patient reports that this all feels the same as when she was seen in the ED and does not feel worse. Patient reports she has been taking the pain medication prescribed to her in the ED but it is not helping. Patient states she does not want to go anywhere to be seen as she is unable to drive and is requesting a call back from PCP regarding if she can get a different pain medication. Advised would forward request to appropriate person for follow-up and advised patient to call back if symptoms worsen. Patient verbalized understanding.   Reason for Disposition  [1] SEVERE pain AND [2] not improved 2 hours after pain medicine/ice packs  Answer Assessment - Initial Assessment Questions 1. ONSET: "When did the pain start?"      A few days 2. LOCATION: "Where is the pain located?"      right 3. PAIN: "How bad is the pain?"    (Scale 1-10; or mild, moderate, severe)  - MILD (1-3): doesn't interfere with normal activities.   - MODERATE (4-7): interferes with normal activities (e.g., work or school) or awakens from sleep, limping.   - SEVERE (8-10): excruciating pain, unable to do any normal activities, unable to walk.      severe 4. WORK OR EXERCISE: "Has there been any recent work or exercise that involved this part of the body?"      none 5. CAUSE: "What do you think is causing the foot pain?"      Seen in ED 6. OTHER SYMPTOMS: "Do you have any other symptoms?" (e.g., leg pain, rash, fever, numbness)  Cold in toes  Answer Assessment - Initial Assessment Questions 1. MECHANISM: "How did the injury happen?" (e.g., twisting injury, direct blow)      fall 2. ONSET: "When did the injury happen?" (Minutes or hours ago)      2 days ago 3. LOCATION: "Where is the injury located?"      Right foot 4. APPEARANCE of INJURY: "What does the injury look like?"      cast 5. WEIGHT-BEARING: "Can you put weight on that foot?" "Can you walk (four steps or more)?"       no 6. SIZE: For cuts, bruises, or swelling, ask: "How large is it?" (e.g., inches or centimeters;  entire joint)      no 7. PAIN: "Is there pain?" If Yes, ask: "How bad is the pain?"    (e.g., Scale 1-10; or mild, moderate, severe)   - NONE (0): no pain.   - MILD (1-3): doesn't interfere with normal activities.    - MODERATE (4-7): interferes with normal activities (e.g., work or school) or awakens from sleep, limping.    - SEVERE (8-10): excruciating pain, unable to do any normal activities, unable to walk.      severe 9. OTHER SYMPTOMS: "Do you have any other symptoms?"      tingling  Protocols used: Foot Pain-A-AH, Ankle and Foot Injury-A-AH

## 2023-04-17 NOTE — Telephone Encounter (Signed)
 Pt advised with verbal understanding

## 2023-04-17 NOTE — Telephone Encounter (Signed)
 Chief Complaint: anxiety  Symptoms: crying, anxiety Frequency: since she broke her foot Pertinent Negatives: Patient denies thoughts of self harm or suicide Disposition: [] ED /[] Urgent Care (no appt availability in office) / [] Appointment(In office/virtual)/ []  [place] Virtual [facility]/ [x] Home Care/ [] Refused Recommended Disposition /[] [place] Mobile [facility]/ [x]  Follow-up with PCP Additional Notes: pt called in asking for a home health services as she recently broke her foot and is unable to get around or cook or clean. States she has a room mate but he is out of town and wont be home til Friday.  States she doesn't have anyone near her to help her and needs someone to help cook for her. States that she has not eaten today, states she has cash but does not a card to order delivery. Pt given number to behavioral help line.   Copied from CRM [id number]. Topic: Clinical - Red Word Triage >> Apr 17, 2023  3:14 PM Albin Felling L wrote: Red Word that prompted transfer to Nurse Triage: Pt has broken foot and ankle, requesting help getting a home health nurse/aid to assist her at home. Pt unable to walk and unable to do simple things, patient at home by herself. Pt requesting this to be covered by insurance. Pt has roommate but roommate will not be home until Friday. Patient states she has not been able to eat in a few days. Pt expressed pain, physically and mentally due situation. Reason for Disposition  MILD anxiety symptoms (e.g., Anxiety symptoms are mild and intermittent; symptoms do not interfere with daily activities)  Answer Assessment - Initial Assessment Questions 1. CONCERN: "Did anything happen that prompted you to call today?"      Needs help around house due to a broken foot 2. ANXIETY SYMPTOMS: "Can you describe how you (your loved one; patient) have been feeling?" (e.g., tense, restless, panicky, anxious, keyed up, overwhelmed, sense of impending doom).      overwhelmed 3. ONSET: "How long have you  been feeling this way?" (e.g., hours, days, weeks)     Since broken foot 4. SEVERITY: "How would you rate the level of anxiety?" (e.g., 0 - 10; or mild, moderate, severe).     Mod-severe 5. FUNCTIONAL IMPAIRMENT: "How have these feelings affected your ability to do daily activities?" "Have you had more difficulty than usual doing your normal daily activities?" (e.g., getting better, same, worse; self-care, school, work, interactions)     no 6. HISTORY: "Have you felt this way before?" "Have you ever been diagnosed with an anxiety problem in the past?" (e.g., generalized anxiety disorder, panic attacks, PTSD). If Yes, ask: "How was this problem treated?" (e.g., medicines, counseling, etc.)     medication 7. RISK OF HARM - SUICIDAL IDEATION: "Do you ever have thoughts of hurting or killing yourself?" If Yes, ask:  "Do you have these feelings now?" "Do you have a plan on how you would do this?"     no 8. TREATMENT:  "What has been done so far to treat this anxiety?" (e.g., medicines, relaxation strategies). "What has helped?"     medications 9. TREATMENT - THERAPIST: "Do you have a counselor or therapist? Name?"     no 10. POTENTIAL TRIGGERS: "Do you drink caffeinated beverages (e.g., coffee, colas, teas), and how much daily?" "Do you drink alcohol or use any drugs?" "Have you started any new medicines recently?"       Broke her foot 11. PATIENT SUPPORT: "Who is with you now?" "Who do you live with?" "Do  you have family or friends who you can talk to?"        Her room mate and kids  12. OTHER SYMPTOMS: "Do you have any other symptoms?" (e.g., feeling depressed, trouble concentrating, trouble sleeping, trouble breathing, palpitations or fast heartbeat, chest pain, sweating, nausea, or diarrhea)       crying  Protocols used: Anxiety and Panic Attack-A-AH

## 2023-04-18 ENCOUNTER — Ambulatory Visit (HOSPITAL_COMMUNITY)

## 2023-04-20 ENCOUNTER — Inpatient Hospital Stay: Payer: Self-pay | Admitting: Family Medicine

## 2023-04-20 ENCOUNTER — Telehealth: Payer: Self-pay | Admitting: Family Medicine

## 2023-04-20 NOTE — Telephone Encounter (Signed)
PCS  Noted  Copied Sleeved  Original in PCP box Copy front desk folder

## 2023-04-23 ENCOUNTER — Telehealth: Payer: Self-pay | Admitting: Family Medicine

## 2023-04-23 ENCOUNTER — Inpatient Hospital Stay: Payer: Self-pay

## 2023-04-23 ENCOUNTER — Ambulatory Visit

## 2023-04-23 VITALS — BP 146/83 | HR 103 | Ht 62.0 in | Wt 125.0 lb

## 2023-04-23 DIAGNOSIS — S92344G Nondisplaced fracture of fourth metatarsal bone, right foot, subsequent encounter for fracture with delayed healing: Secondary | ICD-10-CM | POA: Diagnosis not present

## 2023-04-23 DIAGNOSIS — R112 Nausea with vomiting, unspecified: Secondary | ICD-10-CM

## 2023-04-23 DIAGNOSIS — S92301D Fracture of unspecified metatarsal bone(s), right foot, subsequent encounter for fracture with routine healing: Secondary | ICD-10-CM

## 2023-04-23 MED ORDER — HYDROCODONE-ACETAMINOPHEN 5-325 MG PO TABS: 1.0000 | ORAL_TABLET | Freq: Four times a day (QID) | ORAL | 0 refills | Status: DC | PRN

## 2023-04-23 MED ORDER — ONDANSETRON HCL 4 MG PO TABS
4.0000 mg | ORAL_TABLET | Freq: Two times a day (BID) | ORAL | 0 refills | Status: DC | PRN
Start: 2023-04-23 — End: 2023-08-05

## 2023-04-23 NOTE — Telephone Encounter (Signed)
 Copied from CRM [id number]. Topic: Clinical - Prescription Issue >> Apr 23, 2023  5:13 PM Santiya F wrote: Reason for CRM: Patient is calling in because the prescription for HYDROcodone-acetaminophen (NORCO/VICODIN) 5-325 MG tablet [[id number]] needs to be resent.

## 2023-04-23 NOTE — Progress Notes (Unsigned)
 Acute Office Visit  Subjective:     Patient ID: Joan Wood, female    DOB: 05-Jul-1970, 53 y.o.   MRN: [id number]  Chief Complaint  Patient presents with   Hospitalization Follow-up    Hosptial follow up foot injury after she fell   Pt presented to ED on 04/15/23 after a fall at home. Patient reports that she was taking a nap when she got up to walk to the bathroom. She was wearing socks and symptom the hardwood floor. She fell landing on her elbows and as she was going down twisted her right ankle. Patient reports that she thinks she fell with inversion type mechanism. She is able to move her toes but it hurts significantly. She was given fentanyl by EMS and reports significant improvement in symptoms. She feels like she cannot bear weight because the pain is bad. Denies any other associated injuries during the fall. She did not hit her head no loss of consciousness.   HPI Patient is in today for right foot fracture  Review of Systems  Musculoskeletal:  Positive for myalgias.        Objective:    BP (!) 146/83   Pulse (!) 103   Ht 5\' 2"  (1.575 m)   Wt 125 lb (56.7 kg)   SpO2 97%   BMI 22.86 kg/m    Physical Exam Vitals and nursing note reviewed. Exam conducted with a chaperone present (Pt's son is with her today).  Constitutional:      Appearance: Normal appearance.  Eyes:     Extraocular Movements: Extraocular movements intact.     Pupils: Pupils are equal, round, and reactive to light.  Musculoskeletal:     Right foot: Decreased range of motion. Swelling and tenderness present. Normal pulse.     Left foot: Normal.     Comments: Right foot is splinted and wrapped.  Toes visualized and palpated.  Warm to touch, bruising present at base toes, except great toe.   Neurological:     Mental Status: She is alert and oriented to person, place, and time.  Psychiatric:        Mood and Affect: Mood normal.        Thought Content: Thought content normal.     No results  found for any visits on 04/23/23.      Assessment & Plan:   Problem List Items Addressed This Visit       Digestive   Nausea and vomiting   Zofran refilled for prn use      Relevant Medications   ondansetron (ZOFRAN) 4 MG tablet     Musculoskeletal and Integument   Multiple closed fractures of metatarsal bone with routine healing, right - Primary   Agree to refill pain medication until she can get in to see ortho.  She has an appointment for 05/03/23, with Dr. Christell Constant       Meds ordered this encounter  Medications   ondansetron (ZOFRAN) 4 MG tablet    Sig: Take 1 tablet (4 mg total) by mouth 2 (two) times daily as needed for nausea or vomiting.    Dispense:  20 tablet    Refill:  0   DISCONTD: HYDROcodone-acetaminophen (NORCO/VICODIN) 5-325 MG tablet    Sig: Take 1 tablet by mouth every 6 (six) hours as needed for up to 5 days for severe pain (pain score 7-10).    Dispense:  10 tablet    Refill:  0    No follow-ups on  file.  Alison Irvine, FNP

## 2023-04-24 ENCOUNTER — Encounter: Payer: Self-pay | Admitting: *Deleted

## 2023-04-24 ENCOUNTER — Other Ambulatory Visit: Payer: Self-pay

## 2023-04-24 ENCOUNTER — Telehealth: Payer: Self-pay | Admitting: Family Medicine

## 2023-04-24 DIAGNOSIS — S92301D Fracture of unspecified metatarsal bone(s), right foot, subsequent encounter for fracture with routine healing: Secondary | ICD-10-CM

## 2023-04-24 MED ORDER — TRAMADOL HCL 50 MG PO TABS: 50.0000 mg | ORAL_TABLET | Freq: Four times a day (QID) | ORAL | 0 refills | Status: AC | PRN

## 2023-04-24 MED ORDER — HYDROCODONE-ACETAMINOPHEN 5-325 MG PO TABS: 1.0000 | ORAL_TABLET | Freq: Four times a day (QID) | ORAL | 0 refills | Status: DC | PRN

## 2023-04-24 NOTE — Telephone Encounter (Signed)
 Copied from CRM [id number]. Topic: Clinical - Home Health Verbal Orders >> Apr 24, 2023  9:33 AM Turkey B wrote: Caller/Agency:  [facility] Callback Number: [phone] Service Requested: Skilled Nursing/to help her get things done at home, can't use hands well Frequency: weekly Any new concerns about the patient? no

## 2023-04-24 NOTE — Assessment & Plan Note (Signed)
 Agree to refill pain medication until she can get in to see ortho.  She has an appointment for 05/03/23, with Dr. Sulema Endo

## 2023-04-24 NOTE — Telephone Encounter (Signed)
 Mailed letter

## 2023-04-24 NOTE — Assessment & Plan Note (Signed)
Zofran refilled - for prn use.

## 2023-04-25 ENCOUNTER — Ambulatory Visit: Payer: Self-pay

## 2023-04-25 NOTE — Telephone Encounter (Signed)
 Copied from CRM [id number]. Topic: Clinical - Red Word Triage >> Apr 25, 2023  1:22 PM Joan Wood wrote: [facility] that prompted transfer to Nurse Triage: PT is in pain and asking about HYDROcodone-acetaminophen (NORCO/VICODIN) 5-325 MG tablet [[id number]] and was prescribed tramadol   "Joan Irvine, FNP to Joan Wood, CMA  [facility]   04/24/23 12:03 PM Sent in tramadol in place of hydrocodone"   Patient calling in for clarification about her new prescription for Tramadol. I have answered the patient's questions and she has no further questions at this time.    Reason for Disposition  Caller has medicine question only, adult not sick, AND triager answers question  Answer Assessment - Initial Assessment Questions 1. NAME of MEDICINE: "What medicine(s) are you calling about?"     Tramadol 2. QUESTION: "What is your question?" (e.g., double dose of medicine, side effect)     Was told she would be prescribed Norco/Vidcodin, but was prescribed Tramadol instead  3. PRESCRIBER: "Who prescribed the medicine?" Reason: if prescribed by specialist, call should be referred to that group.     Rice Chamorro Huenink 4. SYMPTOMS: "Do you have any symptoms?" If Yes, ask: "What symptoms are you having?"  "How bad are the symptoms (e.g., mild, moderate, severe)     Pain  Protocols used: Medication Question Call-A-AH

## 2023-04-26 ENCOUNTER — Telehealth: Payer: Self-pay | Admitting: Family Medicine

## 2023-04-26 ENCOUNTER — Ambulatory Visit: Payer: Self-pay

## 2023-04-26 NOTE — Telephone Encounter (Addendum)
 Chief Complaint: toe pain Symptoms: R toe pain, knee, and ankle pain, R knee swelling Frequency: injured her foot 4/6, seen in the office 4/14 Pertinent Negatives: Patient denies redness, CP, SOB Disposition: [] ED /[] Urgent Care (no appt availability in office) / [] Appointment(In office/virtual)/ []  [facility]/ [] Home Care/ [] Refused Recommended Disposition /[]  Mobile Bus/ [x]  Follow-up with PCP Additional Notes: Pt calls because she fractured 3 toes on her R foot and was instructed not to drive. Pt was seen 4/14 and prescribed Tramadol for pain but has not been able to drive to the pharmacy to pick it up. Pt has been taking Tylenol with no relief. Pt endorses pain to her second, third, and fourth toes as well as her ankle and now her knee. Pt states the knee pain is new and is accompanied by swelling. Pt states her R knee is 2x the size as the L knee. Pt reports her knee pain is a 9/10 and her toe pain is a 6/10. Pt reports numbness and tingling to her toes and states this was present at her 4/14 office visit, although it may be worse now. Pt endorses black discoloration to her toes, but states this was present at her 4/14 office visit and is not any worse. Denies hx of DVT. Denies cardiac hx other than SVT which she has not had in 3-4 yrs. Denies CP and SOB, although she does endorse pain to her R ribs, R back, and R hips and states she has pain in the middle of her rib cage and on the R side with deep inspiration. Pt states her roommate will not be home until Saturday and he can pick up her medicine then, but she can't deal with the pain until then. Pt's son that lives in [place] brought her to her office visit on 4/14, but the pt states she does not want to bother her son again. Pt states she is new to the area and denies having a friend that can pick up the medication for her. Pt recently contacted a home health service for assistance with bathing and is awaiting their callback. RN  advised pt RN would relay concern to the office for follow-up and that if she develops any CP or SOB, she needs to call 911. Pt verbalized understanding.   RN called this patient again to follow-up. Pt reports 8.5/10 R knee and R ankle pain with R knee swelling since Tuesday. Pain is worse with standing. Pt states swelling is on the inside of her R knee. States she woke up on Tuesday with the swelling. Pt endorses an area of swelling to the medial side of her R knee with 6/10 pain that is warm to the touch. Denies redness. RN advised pt that she should go to the ED to rule out a blood clot. Pt does not have transportation. RN advised pt RN would like to call EMS for the pt. Pt declined at this time. RN educated pt about why the ED is the best and safest place for her at this time, pt verbalized understanding but ultimately refused EMS at this time. Pt states she will have her roommate take her to the ED tomorrow morning when he comes home from work. RN advised pt that if her swelling or pain worsens or she notices redness to the knee, CP, or SOB, she needs to call 911. RN called the CAL to inform, CAL stated they will try to have someone from the office call the pt.  Answer Assessment - Initial Assessment Questions 1. ONSET: "When did the pain start?"      Pt seen 4/14 in the office, went to the ED on 4/6 - pt slipped and fell, breaking 3 toes 2. LOCATION: "Where is the pain located?"   (e.g., around nail, entire toe, at foot joint)      R 2nd, 3rd, and 4th toe as well as top of her foot and ankle. Pt reports pain in the R knee as well  3. PAIN: "How bad is the pain?"    (Scale 1-10; or mild, moderate, severe)   -  MILD (1-3): doesn't interfere with normal activities    -  MODERATE (4-7): interferes with normal activities (e.g., work or school) or awakens from sleep, limping    -  SEVERE (8-10): excruciating pain, unable to do any normal activities, unable to walk     "Throbbing" at the bottom of  her toes and the very top of her foot and ankle with pain in her R knee as well - swelling in the knee 10-11 days after injury. Pt rates knee pain a 9/10. Pt rates ankle and toe pain 6-7/10. 4. APPEARANCE: "What does the toe look like?" (e.g., redness, swelling, bruising, pallor)     Foot is in a cast at this time 5. CAUSE: "What do you think is causing the toe pain?"     3 toe fractures 6. OTHER SYMPTOMS: "Do you have any other symptoms?" (e.g., leg pain, rash, fever, numbness)     Took Tylenol at 0600 today, did not touch the pain. Pt states she knows nobody out here, she just moved here, and she can't get medications. Pt states she can't drive, provider asked her not to drive with toe fractures. Pt is wearing a cast on her foot. Pt states her R knee is now swollen and painful. States that knee is warmer, but not hot. Pt states that developed late yesterday.  Denies redness and bruising. Denies hx of DVT. Pt states she was diagnosed with SVT but has not had an episode in 3-4 yrs. Denies any other cardiac or lung hx.  Pt endorses "tingling" in her toes that started when she fell on 4/6. "Periodically through the day my toes get numb and they tingle, they're cold all the time, I have 2 slipper socks over the bandages and they're still cold." Pt states this has been going on since she fell on 4/6 - states numbness is becoming more constant. Pt states with her toe she can't feel the leg extension on her wheelchair. Pt discussed this at 4/14 office visit. Pt reports some of her toes are "black" but her toes looked like that on 4/14 when she was seen. Pt states this is not worse than it was.   Pt states she has signed up to get an in-home care nurse. Pt denies CP or SOB, but "right in the middle of my rib cage on the R side towards my back is hurting, everything from my underarms down to my toes hurts on the R side including her hip, back, ribs." Pt states she rolled onto her R side while sleeping and it hurt  and woke her up. Pt states when she takes a deep breath it hurts in the middle of her rib cage/on her R side - states this started 2-3 days ago, after she went to the doctor  Protocols used: Toe Pain-A-AH

## 2023-04-26 NOTE — Telephone Encounter (Signed)
 Copied from CRM [id number]. Topic: General - Other >> Apr 26, 2023 11:27 AM Jeris Montes S wrote: Reason for CRM: Patient called to see if anyone would be able to go the pharmacy to pick up her meds because she's not able to get it right now and her roommate is out of town until 04/19..Advised to contact her insurance to schedule transportation, patient stated that she would need to schedule 3 days in advance.Aaron AasAaron AasAsking that if someone else could go and pick up her meds? I advised that that is probably unlikely but I will pass this request on to the doctor and someone will cb with yes or no.Aaron AasAaron Aas

## 2023-04-26 NOTE — Telephone Encounter (Signed)
 Spoke to pt, her roommate came home early and will pick up her medication

## 2023-04-27 NOTE — Telephone Encounter (Signed)
In providers box, waiting for signature

## 2023-04-27 NOTE — Telephone Encounter (Signed)
 Waiting on provider to finish form, will keep pt updated

## 2023-04-27 NOTE — Telephone Encounter (Signed)
 Copied from CRM [id number]. Topic: Referral - Question >> Apr 27, 2023  1:34 PM Cherylann RAMAN wrote: Reason for CRM: Patient would like to speak with her provider regarding getting a home health nurse for 4 to 5 hours a day and at least twice a week. Patient states she has not had a bath in 12 days and is unable to take baths and wash her hair. Please contact patient back at [phone].

## 2023-05-02 ENCOUNTER — Telehealth: Payer: Self-pay | Admitting: Family Medicine

## 2023-05-02 NOTE — Telephone Encounter (Unsigned)
 Copied from CRM [id number]. Topic: General - Other >> May 02, 2023  9:13 AM Marissa P wrote: Reason for CRM: Patient called in wanting to advise us  that she will be needing transportation and an in home nurse so she was advised to let us  know that [facility] will be sending in paperwork to us  that will need to be signed and returned asap please. It should be a disability paperwork.  They provided a NPI: [id number]. Thank you

## 2023-05-03 ENCOUNTER — Encounter: Admitting: Orthopedic Surgery

## 2023-05-03 NOTE — Telephone Encounter (Signed)
 Form given to provider. Will fax when completed

## 2023-05-09 ENCOUNTER — Telehealth: Payer: Self-pay

## 2023-05-09 NOTE — Telephone Encounter (Signed)
 Copied from CRM [id number]. Topic: Clinical - Medical Advice >> May 09, 2023 10:52 AM Everlene Hobby D wrote: Wants  to know if she was diagnosed with porosis of the liver. She says she don't know if she has it or if she dreamed it.    Call back [phone]

## 2023-05-09 NOTE — Telephone Encounter (Signed)
 Copied from CRM [id number]. Topic: Clinical - Request for Lab/Test Order >> May 09, 2023 11:51 AM Crispin Dolphin wrote: Reason for CRM: Patient called states she would like to come in an have an xray done on her foot to make sure it is healing properly. Thank You

## 2023-05-09 NOTE — Telephone Encounter (Signed)
 Spoke to pt and she would like to wait until June and wants early morning on a Monday. Will call to schedule once we get providers schedule for June

## 2023-05-09 NOTE — Telephone Encounter (Signed)
 Do I need to have her speak with gastro or can this question be answered for her?

## 2023-05-09 NOTE — Telephone Encounter (Signed)
 Requesting to get an xray to make sure her foot fractures are healing properly. Had xray on 04/15/23. Pls advise

## 2023-05-10 NOTE — Telephone Encounter (Signed)
 Kindly advise the patient to speak with Gastroenterology

## 2023-05-11 ENCOUNTER — Ambulatory Visit: Payer: Self-pay

## 2023-05-11 ENCOUNTER — Other Ambulatory Visit: Payer: Self-pay | Admitting: Family Medicine

## 2023-05-11 ENCOUNTER — Emergency Department (HOSPITAL_COMMUNITY)
Admission: EM | Admit: 2023-05-11 | Discharge: 2023-05-11 | Discharge status: Against Medical Advice | Attending: Emergency Medicine | Admitting: Emergency Medicine

## 2023-05-11 ENCOUNTER — Encounter (HOSPITAL_COMMUNITY): Payer: Self-pay | Admitting: Emergency Medicine

## 2023-05-11 ENCOUNTER — Emergency Department (HOSPITAL_COMMUNITY)

## 2023-05-11 ENCOUNTER — Other Ambulatory Visit: Payer: Self-pay

## 2023-05-11 DIAGNOSIS — R112 Nausea with vomiting, unspecified: Principal | ICD-10-CM | POA: Diagnosis present

## 2023-05-11 DIAGNOSIS — R197 Diarrhea, unspecified: Secondary | ICD-10-CM | POA: Diagnosis not present

## 2023-05-11 DIAGNOSIS — Z91040 Latex allergy status: Secondary | ICD-10-CM | POA: Diagnosis not present

## 2023-05-11 DIAGNOSIS — S92344G Nondisplaced fracture of fourth metatarsal bone, right foot, subsequent encounter for fracture with delayed healing: Secondary | ICD-10-CM

## 2023-05-11 DIAGNOSIS — F1090 Alcohol use, unspecified, uncomplicated: Secondary | ICD-10-CM | POA: Diagnosis present

## 2023-05-11 DIAGNOSIS — K859 Acute pancreatitis without necrosis or infection, unspecified: Secondary | ICD-10-CM

## 2023-05-11 DIAGNOSIS — E871 Hypo-osmolality and hyponatremia: Secondary | ICD-10-CM | POA: Diagnosis present

## 2023-05-11 DIAGNOSIS — K2970 Gastritis, unspecified, without bleeding: Secondary | ICD-10-CM | POA: Diagnosis not present

## 2023-05-11 DIAGNOSIS — I10 Essential (primary) hypertension: Secondary | ICD-10-CM | POA: Insufficient documentation

## 2023-05-11 DIAGNOSIS — R932 Abnormal findings on diagnostic imaging of liver and biliary tract: Secondary | ICD-10-CM | POA: Diagnosis not present

## 2023-05-11 DIAGNOSIS — K7460 Unspecified cirrhosis of liver: Secondary | ICD-10-CM | POA: Diagnosis not present

## 2023-05-11 DIAGNOSIS — K7689 Other specified diseases of liver: Secondary | ICD-10-CM | POA: Diagnosis not present

## 2023-05-11 DIAGNOSIS — R1111 Vomiting without nausea: Secondary | ICD-10-CM | POA: Diagnosis not present

## 2023-05-11 DIAGNOSIS — E876 Hypokalemia: Secondary | ICD-10-CM | POA: Insufficient documentation

## 2023-05-11 DIAGNOSIS — Z79899 Other long term (current) drug therapy: Secondary | ICD-10-CM | POA: Insufficient documentation

## 2023-05-11 DIAGNOSIS — R109 Unspecified abdominal pain: Secondary | ICD-10-CM | POA: Diagnosis present

## 2023-05-11 DIAGNOSIS — R000 Tachycardia, unspecified: Secondary | ICD-10-CM | POA: Diagnosis not present

## 2023-05-11 DIAGNOSIS — R110 Nausea: Secondary | ICD-10-CM | POA: Diagnosis not present

## 2023-05-11 DIAGNOSIS — K2900 Acute gastritis without bleeding: Secondary | ICD-10-CM

## 2023-05-11 LAB — CBC WITH DIFFERENTIAL/PLATELET
Abs Immature Granulocytes: 0.04 10*3/uL (ref 0.00–0.07)
Basophils Absolute: 0 10*3/uL (ref 0.0–0.1)
Basophils Relative: 0 %
Eosinophils Absolute: 0.1 10*3/uL (ref 0.0–0.5)
Eosinophils Relative: 2 %
HCT: 30.1 % — ABNORMAL LOW (ref 36.0–46.0)
Hemoglobin: 10.8 g/dL — ABNORMAL LOW (ref 12.0–15.0)
Immature Granulocytes: 1 %
Lymphocytes Relative: 19 %
Lymphs Abs: 1.1 10*3/uL (ref 0.7–4.0)
MCH: 35.9 pg — ABNORMAL HIGH (ref 26.0–34.0)
MCHC: 35.9 g/dL (ref 30.0–36.0)
MCV: 100 fL (ref 80.0–100.0)
Monocytes Absolute: 0.7 10*3/uL (ref 0.1–1.0)
Monocytes Relative: 12 %
Neutro Abs: 4 10*3/uL (ref 1.7–7.7)
Neutrophils Relative %: 66 %
Platelets: 100 10*3/uL — ABNORMAL LOW (ref 150–400)
RBC: 3.01 MIL/uL — ABNORMAL LOW (ref 3.87–5.11)
RDW: 12.9 % (ref 11.5–15.5)
WBC: 6 10*3/uL (ref 4.0–10.5)
nRBC: 0 % (ref 0.0–0.2)

## 2023-05-11 LAB — COMPREHENSIVE METABOLIC PANEL WITH GFR
ALT: 38 U/L (ref 0–44)
AST: 99 U/L — ABNORMAL HIGH (ref 15–41)
Albumin: 3.5 g/dL (ref 3.5–5.0)
Alkaline Phosphatase: 74 U/L (ref 38–126)
Anion gap: 15 (ref 5–15)
BUN: 23 mg/dL — ABNORMAL HIGH (ref 6–20)
CO2: 24 mmol/L (ref 22–32)
Calcium: 9 mg/dL (ref 8.9–10.3)
Chloride: 84 mmol/L — ABNORMAL LOW (ref 98–111)
Creatinine, Ser: 0.61 mg/dL (ref 0.44–1.00)
GFR, Estimated: >60 mL/min (ref >60)
Glucose, Bld: 97 mg/dL (ref 70–99)
Potassium: 2.9 mmol/L — ABNORMAL LOW (ref 3.5–5.1)
Sodium: 123 mmol/L — ABNORMAL LOW (ref 135–145)
Total Bilirubin: 2.2 mg/dL — ABNORMAL HIGH (ref 0.0–1.2)
Total Protein: 8 g/dL (ref 6.5–8.1)

## 2023-05-11 LAB — URINALYSIS, ROUTINE W REFLEX MICROSCOPIC
Bacteria, UA: NONE SEEN
Bilirubin Urine: NEGATIVE
Glucose, UA: NEGATIVE mg/dL
Ketones, ur: 5 mg/dL — AB
Leukocytes,Ua: NEGATIVE
Nitrite: NEGATIVE
Protein, ur: NEGATIVE mg/dL
Specific Gravity, Urine: 1.017 (ref 1.005–1.030)
pH: 7 (ref 5.0–8.0)

## 2023-05-11 LAB — LACTIC ACID, PLASMA: Lactic Acid, Venous: 1.8 mmol/L (ref 0.5–1.9)

## 2023-05-11 LAB — LIPASE, BLOOD: Lipase: 73 U/L — ABNORMAL HIGH (ref 11–51)

## 2023-05-11 MED ORDER — PANTOPRAZOLE SODIUM 40 MG IV SOLR
40.0000 mg | Freq: Once | INTRAVENOUS | Status: AC
  Administered 2023-05-11: 40 mg via INTRAVENOUS
  Filled 2023-05-11: qty 10

## 2023-05-11 MED ORDER — ONDANSETRON HCL 4 MG/2ML IJ SOLN
4.0000 mg | Freq: Once | INTRAMUSCULAR | Status: AC
Start: 2023-05-11 — End: 2023-05-11
  Administered 2023-05-11: 4 mg via INTRAVENOUS
  Filled 2023-05-11: qty 2

## 2023-05-11 MED ORDER — LACTATED RINGERS IV BOLUS
1000.0000 mL | Freq: Once | INTRAVENOUS | Status: AC
  Administered 2023-05-11: 1000 mL via INTRAVENOUS

## 2023-05-11 MED ORDER — POTASSIUM CHLORIDE 10 MEQ/100ML IV SOLN
10.0000 meq | INTRAVENOUS | Status: AC
  Administered 2023-05-11 (×2): 10 meq via INTRAVENOUS
  Filled 2023-05-11 (×2): qty 100

## 2023-05-11 MED ORDER — IOHEXOL 300 MG/ML  SOLN
100.0000 mL | Freq: Once | INTRAMUSCULAR | Status: AC | PRN
Start: 2023-05-11 — End: 2023-05-11
  Administered 2023-05-11: 100 mL via INTRAVENOUS

## 2023-05-11 MED ORDER — MAGNESIUM SULFATE 2 GM/50ML IV SOLN
2.0000 g | Freq: Once | INTRAVENOUS | Status: AC
  Administered 2023-05-11: 2 g via INTRAVENOUS
  Filled 2023-05-11: qty 50

## 2023-05-11 NOTE — Discharge Instructions (Signed)
 Thank you for coming to [facility] Emergency Department. We did an exam, labs, and imaging, and these showed low potassium low sodium, cirrhosis of the liver, gastritis. We recommended admission to the hospital but you prefer to go home. You are leaving AGAINST MEDICAL ADVICE. You have accepted the risks of leaving including nausea/vomiting, pain, dehydration, seizures, coma, death.    Please return to the hospital as soon as possible.

## 2023-05-11 NOTE — Telephone Encounter (Signed)
 Orders placed. Please encouraged to follow up with orthopedic surgery

## 2023-05-11 NOTE — ED Triage Notes (Signed)
 Pt  c/o abdominal pain with n/v x 3 days. States roommate had the "same thing".

## 2023-05-11 NOTE — Telephone Encounter (Addendum)
 Copied from CRM [id number]. Topic: Clinical - Red Word Triage >> May 11, 2023  9:59 AM Leory Rands wrote: Red Word that prompted transfer to Nurse Triage: Paitent is calling to report that she has black diarrhea, and vomiting for 36 hours.    Chief Complaint: Diarrhea, vomiting. Keeping liquids down. Symptoms: Above Frequency: 3 days Pertinent Negatives: Patient denies fever Disposition: [] ED /[] Urgent Care (no appt availability in office) / [x] Appointment(In office/virtual)/ []  [facility] [facility]/ [] Home Care/ [] Refused Recommended Disposition /[] [facility] [facility]/ []  Follow-up with PCP Additional Notes: Agrees with appointment. Will go to ED for worsening of symptoms. Reason for Disposition  [1] Mild diarrhea (e.g., 1-3 or more stools than normal in past 24 hours) without known cause AND [2] present >  7 days  Answer Assessment - Initial Assessment Questions 1. DIARRHEA SEVERITY: "How bad is the diarrhea?" "How many more stools have you had in the past 24 hours than normal?"    - NO DIARRHEA (SCALE 0)   - MILD (SCALE 1-3): Few loose or mushy BMs; increase of 1-3 stools over normal daily number of stools; mild increase in ostomy output.   -  MODERATE (SCALE 4-7): Increase of 4-6 stools daily over normal; moderate increase in ostomy output.   -  SEVERE (SCALE 8-10; OR "WORST POSSIBLE"): Increase of 7 or more stools daily over normal; moderate increase in ostomy output; incontinence.     30 2. ONSET: "When did the diarrhea begin?"      36 hours 3. BM CONSISTENCY: "How loose or watery is the diarrhea?"      Tar, black 4. VOMITING: "Are you also vomiting?" If Yes, ask: "How many times in the past 24 hours?"      Yes 5. ABDOMEN PAIN: "Are you having any abdomen pain?" If Yes, ask: "What does it feel like?" (e.g., crampy, dull, intermittent, constant)      No 6. ABDOMEN PAIN SEVERITY: If present, ask: "How bad is the pain?"  (e.g., Scale 1-10; mild, moderate, or severe)   - MILD  (1-3): doesn't interfere with normal activities, abdomen soft and not tender to touch    - MODERATE (4-7): interferes with normal activities or awakens from sleep, abdomen tender to touch    - SEVERE (8-10): excruciating pain, doubled over, unable to do any normal activities       no 7. ORAL INTAKE: If vomiting, "Have you been able to drink liquids?" "How much liquids have you had in the past 24 hours?"     yes 8. HYDRATION: "Any signs of dehydration?" (e.g., dry mouth [not just dry lips], too weak to stand, dizziness, new weight loss) "When did you last urinate?"     no 9. EXPOSURE: "Have you traveled to a foreign country recently?" "Have you been exposed to anyone with diarrhea?" "Could you have eaten any food that was spoiled?"     no 10. ANTIBIOTIC USE: "Are you taking antibiotics now or have you taken antibiotics in the past 2 months?"       no 11. OTHER SYMPTOMS: "Do you have any other symptoms?" (e.g., fever, blood in stool)       no 12. PREGNANCY: "Is there any chance you are pregnant?" "When was your last menstrual period?"       no  Protocols used: [facility]

## 2023-05-11 NOTE — Progress Notes (Signed)
 Got patient up to [facility] to void. Patient refusing admission. Endorsees wanting to go home.

## 2023-05-11 NOTE — Telephone Encounter (Signed)
 Appointment made, patient should go to ED or urgent care

## 2023-05-11 NOTE — ED Notes (Signed)
 Pt notified SWOT RN that she would not like to be admitted tonight and will come back in the am as she would like to sleep in her own bed tonight and was not mentally prepared to be admitted in the hospital. MD made aware, Dr Georgie Kiss to pt bedside to discuss risks and benefits of leaving AMA. Pt aware of risks and benefits and signed AMA form. Pt states her ride will be here to pick her up at 2100.

## 2023-05-11 NOTE — Telephone Encounter (Signed)
 Left detailed message.

## 2023-05-11 NOTE — Telephone Encounter (Signed)
 Chief Complaint: diarrhea Symptoms: diarrhea, vomiting, fainting Frequency: since Wednesday Pertinent Negatives: Patient denies CP Disposition: [x] ED /[] Urgent Care (no appt availability in office) / [] Appointment(In office/virtual)/ []  [facility]/ [] Home Care/ [x] Refused Recommended Disposition /[] [place] [facility]/ []  Follow-up with PCP  Additional Notes: Pt called for medical advice regarding what she can take for diarrhea. Pt endorses vomiting and diarrhea since Wednesday. Pt states she vomited last at 0800 today and has been able to keep down crackers and water since. Before that, she states she was vomiting every 30 mins. Pt endorses 10 episodes of diarrhea today. States it is dark, tarry, and green. Pt also states she fainted today for a couple seconds, held onto a recliner, and slid to the ground. Denies injuring herself from the fall, denies a head strike.  Pt has an appt for Monday. RN advised pt she needs to go to the ED. Pt states she has no way to get there because she can't drive and her roommate is not home. Then, pt reported SOB with deep inspiration d/t ongoing, worsening, abdominal bloating. Pt denies SOB at rest and without deep inspiration. RN again advised ED, pt again declined. Pt also declined 911, states she will not go to the ED until her roommate comes home. RN advised pt if she develops worsening SOB or CP she has to call 911. Pt verbalized understanding.  RN called [facility] to inform, they are on lunch break at this time.   Copied from CRM [id number]. Topic: Clinical - Red Word Triage >> May 11, 2023 12:17 PM Lizabeth Riggs wrote: Breckin is calling back to see what she can take for the diarrhea and sick to her stomach.  Reason for Disposition  Patient sounds very sick or weak to the triager  Answer Assessment - Initial Assessment Questions 1. DIARRHEA SEVERITY: "How bad is the diarrhea?" "How many more stools have you had in the past 24 hours than normal?"    - NO  DIARRHEA (SCALE 0)   - MILD (SCALE 1-3): Few loose or mushy BMs; increase of 1-3 stools over normal daily number of stools; mild increase in ostomy output.   -  MODERATE (SCALE 4-7): Increase of 4-6 stools daily over normal; moderate increase in ostomy output.   -  SEVERE (SCALE 8-10; OR "WORST POSSIBLE"): Increase of 7 or more stools daily over normal; moderate increase in ostomy output; incontinence.     10-11 episodes of diarrhea today 2. ONSET: "When did the diarrhea begin?"      Wednesday  3. BM CONSISTENCY: "How loose or watery is the diarrhea?"      "Like tar" 4. VOMITING: "Are you also vomiting?" If Yes, ask: "How many times in the past 24 hours?"      Q 30 mins 5. ABDOMEN PAIN: "Are you having any abdomen pain?" If Yes, ask: "What does it feel like?" (e.g., crampy, dull, intermittent, constant)      Pain right below her breasts at the bottom of her rib cage with back pain, states it feels like a pulled muscle 6. ABDOMEN PAIN SEVERITY: If present, ask: "How bad is the pain?"  (e.g., Scale 1-10; mild, moderate, or severe)   - MILD (1-3): doesn't interfere with normal activities, abdomen soft and not tender to touch    - MODERATE (4-7): interferes with normal activities or awakens from sleep, abdomen tender to touch    - SEVERE (8-10): excruciating pain, doubled over, unable to do any normal activities  7. ORAL INTAKE: If vomiting, "Have you been able to drink liquids?" "How much liquids have you had in the past 24 hours?"     Been able to keep water down today since she's stopped vomiting. Got 1.5 bottles of water down today, kept it down 8. HYDRATION: "Any signs of dehydration?" (e.g., dry mouth [not just dry lips], too weak to stand, dizziness, new weight loss) "When did you last urinate?"     "I fainted this AM"  9. EXPOSURE: "Have you traveled to a foreign country recently?" "Have you been exposed to anyone with diarrhea?" "Could you have eaten any food that was spoiled?"     "My  roommate last weekend, they told him it was his diabetes" 10. ANTIBIOTIC USE: "Are you taking antibiotics now or have you taken antibiotics in the past 2 months?"       No  11. OTHER SYMPTOMS: "Do you have any other symptoms?" (e.g., fever, blood in stool)     Very nauseous w/ vomiting. Vomited at 8AM today. Kept crackers down after. Has vomited every half hour for the last 24 hours. Looks like chopped up beef soup. Reports 10-11 diarrhea episodes. Endorses she fainted this AM for 10 seconds. Pt states she was trying to get to the bathroom, pt states she fainted, held onto a recliner, and slid the floor. Denies hitting her head or any injuries.  Protocols used: [facility]

## 2023-05-11 NOTE — ED Provider Notes (Signed)
 [place] EMERGENCY DEPARTMENT AT [facility] Provider Note   CSN: [id number] Arrival date & time: 05/11/23  1321     History {Add pertinent medical, surgical, social history, OB history to HPI:1} Chief Complaint  Patient presents with   Abdominal Pain    Joan Wood is a 53 y.o. female with PMH as listed below who presents with abdominal pain with n/v x 3 days. States roommate had the "same thing". Severe nonbloody diarrhea, >10 BMs per day since then as well. Intractable nausea/vomiting not helped by nausea medicine at home. A/w epigastric abd pain. No hematemesis. No fevers but endorses chills. No urinary sxs or back pain. No abd surgical history. Endorses alcohol history but has cut back lately.    Past Medical History:  Diagnosis Date   Anxiety    GERD (gastroesophageal reflux disease)    Hernia of abdominal wall    HTN (hypertension)    SVT (supraventricular tachycardia) (HCC)        Home Medications Prior to Admission medications   Medication Sig Start Date End Date Taking? Authorizing Provider  Cholecalciferol  (VITAMIN D3) 25 MCG (1000 UT) CAPS Take 1 capsule (1,000 Units total) by mouth daily. 05/02/22   Zarwolo, Gloria, FNP  folic acid  (FOLVITE ) 1 MG tablet Take 1 tablet (1 mg total) by mouth daily. 03/29/23   Zarwolo, Gloria, FNP  loperamide  (IMODIUM  A-D) 2 MG tablet Take 1 tablet (2 mg total) by mouth 4 (four) times daily as needed for diarrhea or loose stools. 03/29/23   Zarwolo, Gloria, FNP  meloxicam  (MOBIC ) 15 MG tablet Take 1 tablet (15 mg total) by mouth daily. 03/29/23   Zarwolo, Gloria, FNP  omeprazole  (PRILOSEC) 40 MG capsule Take 1 capsule (40 mg total) by mouth daily. 04/11/23   Eustacio Highman, NP  ondansetron  (ZOFRAN ) 4 MG tablet Take 1 tablet (4 mg total) by mouth 2 (two) times daily as needed for nausea or vomiting. 04/23/23   Alison Irvine, FNP  pyridOXINE (VITAMIN B6) 50 MG tablet Take 1 tablet (50 mg total) by mouth daily. 03/29/23    Zarwolo, Gloria, FNP  Vitamin D , Ergocalciferol , (DRISDOL ) 1.25 MG (50000 UNIT) CAPS capsule Take 1 capsule (50,000 Units total) by mouth every 7 (seven) days. 04/15/23   Zarwolo, Gloria, FNP      Allergies    Bee venom    Review of Systems   Review of Systems A 10 point review of systems was performed and is negative unless otherwise reported in HPI.  Physical Exam Updated Vital Signs BP (!) 85/62 (BP Location: Left Arm)   Pulse (!) 125   Resp 17   Ht 5\' 2"  (1.575 m)   Wt 56.7 kg   SpO2 98%   BMI 22.86 kg/m  Physical Exam General: Uncomfortable appearing female, lying in bed.  HEENT: PERRLA, Sclera anicteric, MMM, trachea midline.  Cardiology: RRR, no murmurs/rubs/gallops. BL radial and DP pulses equal bilaterally.  Resp: Normal respiratory rate and effort. CTAB, no wheezes, rhonchi, crackles.  Abd: Actively vomiting.  Soft, +epigastric and RUQ TTP, non-distended. No rebound tenderness or guarding.  GU: Deferred. MSK: No peripheral edema or signs of trauma. Extremities without deformity or TTP.  Skin: warm, dry.  Back: No CVA tenderness Neuro: A&Ox4, CNs II-XII grossly intact. MAEs. Sensation grossly intact.  Psych: Normal mood and affect.   ED Results / Procedures / Treatments   Labs (all labs ordered are listed, but only abnormal results are displayed) Labs Reviewed  CBC WITH DIFFERENTIAL/PLATELET  COMPREHENSIVE METABOLIC PANEL WITH GFR  LIPASE, BLOOD  URINALYSIS, ROUTINE W REFLEX MICROSCOPIC    EKG None  Radiology No results found.  Procedures Procedures  {Document cardiac monitor, telemetry assessment procedure when appropriate:1}  Medications Ordered in ED Medications - No data to display  ED Course/ Medical Decision Making/ A&P                          Medical Decision Making Amount and/or Complexity of Data Reviewed Labs: ordered.  Risk Prescription drug management.    This patient presents to the ED for concern of ***, this involves an  extensive number of treatment options, and is a complaint that carries with it a high risk of complications and morbidity.  I considered the following differential and admission for this acute, potentially life threatening condition.   MDM:    For DDX for abdominal pain includes but is not limited to:  Abdominal exam without peritoneal signs. No evidence of acute abdomen at this time. Highest c/f gastroenteritis, GERD/PUD/gastritis, pancreatitis. Considered acute hepatobiliary disease (including acute cholecystitis or cholangitis), acute appendicitis, vascular catastrophe, bowel obstruction, viscus perforation, diverticulitis.   Presents initially hypotensive which improves w/ fluids. Also tachycardia. Greatest suspicion for hypovolemic shock in s/o significant volume losses, will replete w/ fluids. Also consider septic shock and will get Ct ab pelvis to r/o intraabdominal infection. T ***.     Labs: I Ordered, and personally interpreted labs.  The pertinent results include:  those listed above  Imaging Studies ordered: I ordered imaging studies including *** I independently visualized and interpreted imaging. I agree with the radiologist interpretation  Additional history obtained from chart review.    Cardiac Monitoring: The patient was maintained on a cardiac monitor.  I personally viewed and interpreted the cardiac monitored which showed an underlying rhythm of: sinus tachycardia  Reevaluation: After the interventions noted above, I reevaluated the patient and found that they have :{resolved/improved/worsened:23923::"improved"}  Social Determinants of Health: ***  Disposition:  ***  Co morbidities that complicate the patient evaluation  Past Medical History:  Diagnosis Date   Anxiety    GERD (gastroesophageal reflux disease)    Hernia of abdominal wall    HTN (hypertension)    SVT (supraventricular tachycardia) (HCC)      Medicines No orders of the defined types were  placed in this encounter.   I have reviewed the patients home medicines and have made adjustments as needed  Problem List / ED Course: Problem List Items Addressed This Visit   None        {Document critical care time when appropriate:1} {Document review of labs and clinical decision tools ie heart score, Chads2Vasc2 etc:1}  {Document your independent review of radiology images, and any outside records:1} {Document your discussion with family members, caretakers, and with consultants:1} {Document social determinants of health affecting pt's care:1} {Document your decision making why or why not admission, treatments were needed:1}  This note was created using dictation software, which may contain spelling or grammatical errors.

## 2023-05-11 NOTE — ED Notes (Signed)
 Went to pt to check her temperature via rectal as indicated in the order however pt refused stating that she has had diarrhea and is very sore on her bottom. Will check temp orally

## 2023-05-12 ENCOUNTER — Encounter (HOSPITAL_COMMUNITY): Payer: Self-pay | Admitting: Emergency Medicine

## 2023-05-12 ENCOUNTER — Inpatient Hospital Stay (HOSPITAL_COMMUNITY)
Admission: EM | Admit: 2023-05-12 | Discharge: 2023-05-14 | DRG: 644 | Discharge status: Against Medical Advice | Attending: Internal Medicine | Admitting: Internal Medicine

## 2023-05-12 ENCOUNTER — Emergency Department (HOSPITAL_COMMUNITY)

## 2023-05-12 DIAGNOSIS — D696 Thrombocytopenia, unspecified: Secondary | ICD-10-CM | POA: Diagnosis present

## 2023-05-12 DIAGNOSIS — R112 Nausea with vomiting, unspecified: Secondary | ICD-10-CM | POA: Diagnosis not present

## 2023-05-12 DIAGNOSIS — K429 Umbilical hernia without obstruction or gangrene: Secondary | ICD-10-CM | POA: Diagnosis not present

## 2023-05-12 DIAGNOSIS — I471 Supraventricular tachycardia, unspecified: Secondary | ICD-10-CM | POA: Diagnosis not present

## 2023-05-12 DIAGNOSIS — I700 Atherosclerosis of aorta: Secondary | ICD-10-CM | POA: Diagnosis not present

## 2023-05-12 DIAGNOSIS — E222 Syndrome of inappropriate secretion of antidiuretic hormone: Secondary | ICD-10-CM | POA: Diagnosis not present

## 2023-05-12 DIAGNOSIS — I10 Essential (primary) hypertension: Secondary | ICD-10-CM | POA: Diagnosis present

## 2023-05-12 DIAGNOSIS — Z79899 Other long term (current) drug therapy: Secondary | ICD-10-CM

## 2023-05-12 DIAGNOSIS — M4854XA Collapsed vertebra, not elsewhere classified, thoracic region, initial encounter for fracture: Secondary | ICD-10-CM | POA: Diagnosis present

## 2023-05-12 DIAGNOSIS — K580 Irritable bowel syndrome with diarrhea: Secondary | ICD-10-CM | POA: Diagnosis not present

## 2023-05-12 DIAGNOSIS — Y906 Blood alcohol level of 120-199 mg/100 ml: Secondary | ICD-10-CM | POA: Diagnosis present

## 2023-05-12 DIAGNOSIS — E861 Hypovolemia: Secondary | ICD-10-CM | POA: Diagnosis present

## 2023-05-12 DIAGNOSIS — R296 Repeated falls: Secondary | ICD-10-CM | POA: Diagnosis present

## 2023-05-12 DIAGNOSIS — F1090 Alcohol use, unspecified, uncomplicated: Secondary | ICD-10-CM | POA: Diagnosis not present

## 2023-05-12 DIAGNOSIS — Z791 Long term (current) use of non-steroidal anti-inflammatories (NSAID): Secondary | ICD-10-CM | POA: Diagnosis not present

## 2023-05-12 DIAGNOSIS — F1010 Alcohol abuse, uncomplicated: Secondary | ICD-10-CM | POA: Diagnosis not present

## 2023-05-12 DIAGNOSIS — M50222 Other cervical disc displacement at C5-C6 level: Secondary | ICD-10-CM | POA: Diagnosis not present

## 2023-05-12 DIAGNOSIS — Z91030 Bee allergy status: Secondary | ICD-10-CM | POA: Diagnosis not present

## 2023-05-12 DIAGNOSIS — K766 Portal hypertension: Secondary | ICD-10-CM | POA: Diagnosis not present

## 2023-05-12 DIAGNOSIS — R197 Diarrhea, unspecified: Secondary | ICD-10-CM | POA: Diagnosis present

## 2023-05-12 DIAGNOSIS — F17210 Nicotine dependence, cigarettes, uncomplicated: Secondary | ICD-10-CM | POA: Diagnosis not present

## 2023-05-12 DIAGNOSIS — K7030 Alcoholic cirrhosis of liver without ascites: Secondary | ICD-10-CM | POA: Diagnosis present

## 2023-05-12 DIAGNOSIS — M8580 Other specified disorders of bone density and structure, unspecified site: Secondary | ICD-10-CM | POA: Diagnosis not present

## 2023-05-12 DIAGNOSIS — E860 Dehydration: Secondary | ICD-10-CM | POA: Diagnosis not present

## 2023-05-12 DIAGNOSIS — Z5329 Procedure and treatment not carried out because of patient's decision for other reasons: Secondary | ICD-10-CM | POA: Diagnosis not present

## 2023-05-12 DIAGNOSIS — D62 Acute posthemorrhagic anemia: Secondary | ICD-10-CM | POA: Diagnosis present

## 2023-05-12 DIAGNOSIS — K420 Umbilical hernia with obstruction, without gangrene: Secondary | ICD-10-CM | POA: Diagnosis present

## 2023-05-12 DIAGNOSIS — E876 Hypokalemia: Secondary | ICD-10-CM | POA: Diagnosis present

## 2023-05-12 DIAGNOSIS — R519 Headache, unspecified: Secondary | ICD-10-CM | POA: Diagnosis not present

## 2023-05-12 DIAGNOSIS — K922 Gastrointestinal hemorrhage, unspecified: Secondary | ICD-10-CM | POA: Diagnosis not present

## 2023-05-12 DIAGNOSIS — Z91040 Latex allergy status: Secondary | ICD-10-CM | POA: Diagnosis not present

## 2023-05-12 DIAGNOSIS — R55 Syncope and collapse: Secondary | ICD-10-CM | POA: Diagnosis not present

## 2023-05-12 DIAGNOSIS — E871 Hypo-osmolality and hyponatremia: Principal | ICD-10-CM | POA: Diagnosis present

## 2023-05-12 DIAGNOSIS — K219 Gastro-esophageal reflux disease without esophagitis: Secondary | ICD-10-CM | POA: Diagnosis not present

## 2023-05-12 DIAGNOSIS — K529 Noninfective gastroenteritis and colitis, unspecified: Secondary | ICD-10-CM | POA: Diagnosis not present

## 2023-05-12 DIAGNOSIS — F419 Anxiety disorder, unspecified: Secondary | ICD-10-CM | POA: Diagnosis not present

## 2023-05-12 DIAGNOSIS — Z043 Encounter for examination and observation following other accident: Secondary | ICD-10-CM | POA: Diagnosis not present

## 2023-05-12 DIAGNOSIS — K921 Melena: Secondary | ICD-10-CM | POA: Diagnosis present

## 2023-05-12 DIAGNOSIS — F1020 Alcohol dependence, uncomplicated: Secondary | ICD-10-CM | POA: Diagnosis present

## 2023-05-12 DIAGNOSIS — Z806 Family history of leukemia: Secondary | ICD-10-CM

## 2023-05-12 DIAGNOSIS — W19XXXA Unspecified fall, initial encounter: Secondary | ICD-10-CM

## 2023-05-12 DIAGNOSIS — Y92009 Unspecified place in unspecified non-institutional (private) residence as the place of occurrence of the external cause: Secondary | ICD-10-CM

## 2023-05-12 DIAGNOSIS — R079 Chest pain, unspecified: Secondary | ICD-10-CM | POA: Diagnosis not present

## 2023-05-12 DIAGNOSIS — Z807 Family history of other malignant neoplasms of lymphoid, hematopoietic and related tissues: Secondary | ICD-10-CM

## 2023-05-12 LAB — CBC WITH DIFFERENTIAL/PLATELET
Abs Immature Granulocytes: 0 10*3/uL (ref 0.00–0.07)
Basophils Absolute: 0 10*3/uL (ref 0.0–0.1)
Basophils Relative: 0 %
Eosinophils Absolute: 0 10*3/uL (ref 0.0–0.5)
Eosinophils Relative: 1 %
HCT: 26.5 % — ABNORMAL LOW (ref 36.0–46.0)
Hemoglobin: 9.5 g/dL — ABNORMAL LOW (ref 12.0–15.0)
Lymphocytes Relative: 19 %
Lymphs Abs: 0.9 10*3/uL (ref 0.7–4.0)
MCH: 35.7 pg — ABNORMAL HIGH (ref 26.0–34.0)
MCHC: 35.8 g/dL (ref 30.0–36.0)
MCV: 99.6 fL (ref 80.0–100.0)
Monocytes Absolute: 0.3 10*3/uL (ref 0.1–1.0)
Monocytes Relative: 6 %
Neutro Abs: 3.4 10*3/uL (ref 1.7–7.7)
Neutrophils Relative %: 74 %
Platelets: 91 10*3/uL — ABNORMAL LOW (ref 150–400)
RBC: 2.66 MIL/uL — ABNORMAL LOW (ref 3.87–5.11)
RDW: 13 % (ref 11.5–15.5)
WBC: 4.6 10*3/uL (ref 4.0–10.5)
nRBC: 0 % (ref 0.0–0.2)

## 2023-05-12 LAB — ETHANOL: Alcohol, Ethyl (B): 132 mg/dL — ABNORMAL HIGH (ref <15)

## 2023-05-12 LAB — COMPREHENSIVE METABOLIC PANEL WITH GFR
ALT: 40 U/L (ref 0–44)
AST: 120 U/L — ABNORMAL HIGH (ref 15–41)
Albumin: 3.3 g/dL — ABNORMAL LOW (ref 3.5–5.0)
Alkaline Phosphatase: 72 U/L (ref 38–126)
Anion gap: 13 (ref 5–15)
BUN: 10 mg/dL (ref 6–20)
CO2: 22 mmol/L (ref 22–32)
Calcium: 8.5 mg/dL — ABNORMAL LOW (ref 8.9–10.3)
Chloride: 83 mmol/L — ABNORMAL LOW (ref 98–111)
Creatinine, Ser: 0.43 mg/dL — ABNORMAL LOW (ref 0.44–1.00)
GFR, Estimated: >60 mL/min (ref >60)
Glucose, Bld: 91 mg/dL (ref 70–99)
Potassium: 2.8 mmol/L — ABNORMAL LOW (ref 3.5–5.1)
Sodium: 118 mmol/L — CL (ref 135–145)
Total Bilirubin: 1.9 mg/dL — ABNORMAL HIGH (ref 0.0–1.2)
Total Protein: 7.4 g/dL (ref 6.5–8.1)

## 2023-05-12 LAB — RETICULOCYTES
Immature Retic Fract: 15.7 % (ref 2.3–15.9)
RBC.: 2.68 MIL/uL — ABNORMAL LOW (ref 3.87–5.11)
Retic Count, Absolute: 79.3 10*3/uL (ref 19.0–186.0)
Retic Ct Pct: 3 % (ref 0.4–3.1)

## 2023-05-12 LAB — TSH: TSH: 2.575 u[IU]/mL (ref 0.350–4.500)

## 2023-05-12 LAB — SODIUM
Sodium: 124 mmol/L — ABNORMAL LOW (ref 135–145)
Sodium: 124 mmol/L — ABNORMAL LOW (ref 135–145)

## 2023-05-12 LAB — IRON AND TIBC
Iron: 119 ug/dL (ref 28–170)
Saturation Ratios: 38 % — ABNORMAL HIGH (ref 10.4–31.8)
TIBC: 317 ug/dL (ref 250–450)
UIBC: 198 ug/dL

## 2023-05-12 LAB — MAGNESIUM: Magnesium: 2 mg/dL (ref 1.7–2.4)

## 2023-05-12 LAB — FERRITIN: Ferritin: 266 ng/mL (ref 11–307)

## 2023-05-12 LAB — HIV ANTIBODY (ROUTINE TESTING W REFLEX): HIV Screen 4th Generation wRfx: NONREACTIVE

## 2023-05-12 LAB — OSMOLALITY: Osmolality: 271 mosm/kg — ABNORMAL LOW (ref 275–295)

## 2023-05-12 LAB — FOLATE: Folate: 13.6 ng/mL (ref >5.9)

## 2023-05-12 LAB — LIPASE, BLOOD: Lipase: 91 U/L — ABNORMAL HIGH (ref 11–51)

## 2023-05-12 LAB — PROTIME-INR
INR: 1.2 (ref 0.8–1.2)
Prothrombin Time: 15.1 s (ref 11.4–15.2)

## 2023-05-12 LAB — VITAMIN B12: Vitamin B-12: 704 pg/mL (ref 180–914)

## 2023-05-12 LAB — MRSA NEXT GEN BY PCR, NASAL: MRSA by PCR Next Gen: NOT DETECTED

## 2023-05-12 MED ORDER — OCTREOTIDE LOAD VIA INFUSION
25.0000 ug | Freq: Once | INTRAVENOUS | Status: AC
  Administered 2023-05-12: 25 ug via INTRAVENOUS
  Filled 2023-05-12: qty 13

## 2023-05-12 MED ORDER — SODIUM CHLORIDE 0.9 % IV SOLN
1.0000 g | Freq: Once | INTRAVENOUS | Status: AC
  Administered 2023-05-12: 1 g via INTRAVENOUS
  Filled 2023-05-12: qty 10

## 2023-05-12 MED ORDER — ONDANSETRON HCL 4 MG/2ML IJ SOLN: 4.0000 mg | Freq: Four times a day (QID) | INTRAMUSCULAR | Status: DC | PRN

## 2023-05-12 MED ORDER — POTASSIUM CHLORIDE 10 MEQ/100ML IV SOLN
10.0000 meq | INTRAVENOUS | Status: AC
  Administered 2023-05-12 (×3): 10 meq via INTRAVENOUS
  Filled 2023-05-12: qty 100

## 2023-05-12 MED ORDER — FOLIC ACID 1 MG PO TABS
1.0000 mg | ORAL_TABLET | Freq: Every day | ORAL | Status: DC
  Administered 2023-05-12 – 2023-05-14 (×3): 1 mg via ORAL
  Filled 2023-05-12 (×3): qty 1

## 2023-05-12 MED ORDER — MUPIROCIN 2 % EX OINT: TOPICAL_OINTMENT | Freq: Two times a day (BID) | CUTANEOUS | Status: DC

## 2023-05-12 MED ORDER — THIAMINE MONONITRATE 100 MG PO TABS
100.0000 mg | ORAL_TABLET | Freq: Every day | ORAL | Status: DC
  Administered 2023-05-12 – 2023-05-14 (×3): 100 mg via ORAL
  Filled 2023-05-12 (×3): qty 1

## 2023-05-12 MED ORDER — PANTOPRAZOLE SODIUM 40 MG IV SOLR
40.0000 mg | Freq: Once | INTRAVENOUS | Status: AC
  Administered 2023-05-12: 40 mg via INTRAVENOUS
  Filled 2023-05-12: qty 10

## 2023-05-12 MED ORDER — FENTANYL CITRATE PF 50 MCG/ML IJ SOSY
25.0000 ug | PREFILLED_SYRINGE | Freq: Once | INTRAMUSCULAR | Status: AC
  Administered 2023-05-12: 25 ug via INTRAVENOUS
  Filled 2023-05-12: qty 1

## 2023-05-12 MED ORDER — THIAMINE HCL 100 MG/ML IJ SOLN: 100.0000 mg | Freq: Every day | INTRAMUSCULAR | Status: DC

## 2023-05-12 MED ORDER — HYDROMORPHONE HCL 1 MG/ML IJ SOLN
0.5000 mg | INTRAMUSCULAR | Status: DC | PRN
  Administered 2023-05-12 (×2): 1 mg via INTRAVENOUS
  Administered 2023-05-13: 0.5 mg via INTRAVENOUS
  Administered 2023-05-13 (×2): 1 mg via INTRAVENOUS
  Administered 2023-05-13: 0.5 mg via INTRAVENOUS
  Administered 2023-05-13: 1 mg via INTRAVENOUS
  Administered 2023-05-14: 0.5 mg via INTRAVENOUS
  Filled 2023-05-12 (×4): qty 1
  Filled 2023-05-12: qty 0.5
  Filled 2023-05-12 (×2): qty 1
  Filled 2023-05-12: qty 0.5

## 2023-05-12 MED ORDER — ACETAMINOPHEN 650 MG RE SUPP: 650.0000 mg | Freq: Four times a day (QID) | RECTAL | Status: DC | PRN

## 2023-05-12 MED ORDER — ONDANSETRON HCL 4 MG PO TABS: 4.0000 mg | ORAL_TABLET | Freq: Four times a day (QID) | ORAL | Status: DC | PRN

## 2023-05-12 MED ORDER — ACETAMINOPHEN 325 MG PO TABS: 650.0000 mg | ORAL_TABLET | Freq: Four times a day (QID) | ORAL | Status: DC | PRN

## 2023-05-12 MED ORDER — SODIUM CHLORIDE 0.9 % IV SOLN: INTRAVENOUS | Status: AC

## 2023-05-12 MED ORDER — ADULT MULTIVITAMIN W/MINERALS CH
1.0000 | ORAL_TABLET | Freq: Every day | ORAL | Status: DC
  Administered 2023-05-12 – 2023-05-14 (×3): 1 via ORAL
  Filled 2023-05-12 (×3): qty 1

## 2023-05-12 MED ORDER — POTASSIUM CHLORIDE CRYS ER 20 MEQ PO TBCR
40.0000 meq | EXTENDED_RELEASE_TABLET | Freq: Two times a day (BID) | ORAL | Status: AC
  Administered 2023-05-12 (×2): 40 meq via ORAL
  Filled 2023-05-12 (×2): qty 2

## 2023-05-12 MED ORDER — LORAZEPAM 1 MG PO TABS
1.0000 mg | ORAL_TABLET | ORAL | Status: DC | PRN
  Administered 2023-05-12 (×2): 2 mg via ORAL
  Administered 2023-05-13: 1 mg via ORAL
  Filled 2023-05-12: qty 1
  Filled 2023-05-12 (×2): qty 2

## 2023-05-12 MED ORDER — LORAZEPAM 2 MG/ML IJ SOLN
1.0000 mg | INTRAMUSCULAR | Status: DC | PRN
  Administered 2023-05-13: 2 mg via INTRAVENOUS
  Filled 2023-05-12: qty 1

## 2023-05-12 MED ORDER — SODIUM CHLORIDE 0.9 % IV SOLN
50.0000 ug/h | INTRAVENOUS | Status: DC
  Administered 2023-05-12 – 2023-05-13 (×3): 50 ug/h via INTRAVENOUS
  Filled 2023-05-12 (×8): qty 1

## 2023-05-12 MED ORDER — CHLORHEXIDINE GLUCONATE CLOTH 2 % EX PADS
6.0000 | MEDICATED_PAD | Freq: Every day | CUTANEOUS | Status: DC
  Administered 2023-05-13: 6 via TOPICAL

## 2023-05-12 MED ORDER — SODIUM CHLORIDE 0.9 % IV BOLUS
1000.0000 mL | Freq: Once | INTRAVENOUS | Status: AC
  Administered 2023-05-12: 1000 mL via INTRAVENOUS

## 2023-05-12 MED ORDER — ONDANSETRON HCL 4 MG/2ML IJ SOLN
4.0000 mg | Freq: Once | INTRAMUSCULAR | Status: AC
  Administered 2023-05-12: 4 mg via INTRAVENOUS
  Filled 2023-05-12: qty 2

## 2023-05-12 NOTE — ED Notes (Signed)
 Pt used bedpan to pee, kept urine specimen for just incase.

## 2023-05-12 NOTE — ED Notes (Signed)
 ED TO INPATIENT HANDOFF REPORT  ED Nurse Name and Phone #: Luane Rumps [phone]  S Name/Age/Gender Joan Wood 53 y.o. female Room/Bed: APA19/APA19  Code Status   Code Status: Not on file  Home/SNF/Other Home Patient oriented to: self, place, time, and situation Is this baseline? Yes   Triage Complete: Triage complete  Chief Complaint Acute hyponatremia [E87.1]  Triage Note Pt to ED states she was seen yesterday and was suggested that she be admitted and "I refused".  Pt states was diagnosed with cirrhosis.  States this AM had a syncopal episode falling and now has back and left hip pain.  Pt states wanting to be admitted now.    Allergies Allergies  Allergen Reactions   Bee Venom    Latex Rash    Level of Care/Admitting Diagnosis ED Disposition     ED Disposition  Admit   Condition  --   Comment  Hospital Area: [facility] [[id number]]  Level of Care: Stepdown [14]  Covid Evaluation: Asymptomatic - no recent exposure (last 10 days) testing not required  Diagnosis: Acute hyponatremia [172166]  Admitting Physician: Cornelius Dill [[id number]]  Attending Physician: SHAH, PRATIK D [[id number]]  Certification:: I certify this patient will need inpatient services for at least 2 midnights  Expected Medical Readiness: 05/14/2023          B Medical/Surgery History Past Medical History:  Diagnosis Date   Anxiety    GERD (gastroesophageal reflux disease)    Hernia of abdominal wall    HTN (hypertension)    SVT (supraventricular tachycardia) (HCC)    Past Surgical History:  Procedure Laterality Date   FINGER FRACTURE SURGERY     right ankle repair     age 38     A IV Location/Drains/Wounds Patient Lines/Drains/Airways Status     Active Line/Drains/Airways     Name Placement date Placement time Site Days   Peripheral IV 05/12/23 20 G Left Antecubital 05/12/23  0940  Antecubital  less than 1            Intake/Output Last 24 hours No intake or  output data in the 24 hours ending 05/12/23 1306  Labs/Imaging Results for orders placed or performed during the hospital encounter of 05/12/23 (from the past 48 hours)  Vitamin B12     Status: None   Collection Time: 05/12/23  9:30 AM  Result Value Ref Range   Vitamin B-12 704 180 - 914 pg/mL    Comment: (NOTE) This assay is not validated for testing neonatal or myeloproliferative syndrome specimens for Vitamin B12 levels. Performed at [facility], [address] [place], [name] [address]   Iron and TIBC     Status: Abnormal   Collection Time: 05/12/23  9:30 AM  Result Value Ref Range   Iron 119 28 - 170 ug/dL   TIBC 657 846 - 962 ug/dL   Saturation Ratios 38 (H) 10.4 - 31.8 %   UIBC 198 ug/dL    Comment: Performed at [facility], [address], [place], [name] [address]  Ferritin     Status: None   Collection Time: 05/12/23  9:30 AM  Result Value Ref Range   Ferritin 266 11 - 307 ng/mL    Comment: Performed at [facility], [address] [place], [name] [address]  CBC with Differential     Status: Abnormal   Collection Time: 05/12/23  9:40 AM  Result Value Ref Range   WBC 4.6 4.0 - 10.5 K/uL  RBC 2.66 (L) 3.87 - 5.11 MIL/uL   Hemoglobin 9.5 (L) 12.0 - 15.0 g/dL   HCT 16.1 (L) 09.6 - 04.5 %   MCV 99.6 80.0 - 100.0 fL   MCH 35.7 (H) 26.0 - 34.0 pg   MCHC 35.8 30.0 - 36.0 g/dL   RDW 40.9 81.1 - 91.4 %   Platelets 91 (L) 150 - 400 K/uL    Comment: SPECIMEN CHECKED FOR CLOTS Immature Platelet Fraction may be clinically indicated, consider ordering this additional test [id number]    nRBC 0.0 0.0 - 0.2 %   Neutrophils Relative % 74 %   Neutro Abs 3.4 1.7 - 7.7 K/uL   Lymphocytes Relative 19 %   Lymphs Abs 0.9 0.7 - 4.0 K/uL   Monocytes Relative 6 %   Monocytes Absolute 0.3 0.1 - 1.0 K/uL   Eosinophils Relative 1 %   Eosinophils Absolute 0.0 0.0 - 0.5 K/uL   Basophils Relative 0 %   Basophils Absolute 0.0 0.0 - 0.1 K/uL   WBC Morphology MORPHOLOGY UNREMARKABLE     RBC Morphology MORPHOLOGY UNREMARKABLE    Smear Review PLATELET COUNT CONFIRMED BY SMEAR    Abs Immature Granulocytes 0.00 0.00 - 0.07 K/uL    Comment: Performed at [facility] [address] [place], [name] [address]  Comprehensive metabolic panel     Status: Abnormal   Collection Time: 05/12/23  9:40 AM  Result Value Ref Range   Sodium 118 (LL) 135 - 145 mmol/L    Comment: CRITICAL RESULT CALLED TO, READ BACK BY AND VERIFIED WITH WHITLEY J @ 1011 ON [id number] BY HENDERSON L   Potassium 2.8 (L) 3.5 - 5.1 mmol/L   Chloride 83 (L) 98 - 111 mmol/L   CO2 22 22 - 32 mmol/L   Glucose, Bld 91 70 - 99 mg/dL    Comment: Glucose reference range applies only to samples taken after fasting for at least 8 hours.   BUN 10 6 - 20 mg/dL   Creatinine, Ser 2.95 (L) 0.44 - 1.00 mg/dL   Calcium 8.5 (L) 8.9 - 10.3 mg/dL   Total Protein 7.4 6.5 - 8.1 g/dL   Albumin 3.3 (L) 3.5 - 5.0 g/dL   AST 284 (H) 15 - 41 U/L   ALT 40 0 - 44 U/L   Alkaline Phosphatase 72 38 - 126 U/L   Total Bilirubin 1.9 (H) 0.0 - 1.2 mg/dL   GFR, Estimated >13 >24 mL/min    Comment: (NOTE) Calculated using the CKD-EPI Creatinine Equation (2021)    Anion gap 13 5 - 15    Comment: Performed at [facility], [address], [place], [name] [address]  Ethanol     Status: Abnormal   Collection Time: 05/12/23  9:40 AM  Result Value Ref Range   Alcohol, Ethyl (B) 132 (H) <15 mg/dL    Comment: Please note change in reference range. (NOTE) For medical purposes only. Performed at [facility], [address], [place], [name] [address]   Magnesium      Status: None   Collection Time: 05/12/23  9:40 AM  Result Value Ref Range   Magnesium  2.0 1.7 - 2.4 mg/dL    Comment: Performed at [facility], [address], [place], [name] [address]  Folate     Status: None   Collection Time: 05/12/23  9:43 AM  Result Value Ref Range   Folate 13.6 >5.9 ng/mL    Comment: Performed at [facility], [address], [place], [name] [address]  Reticulocytes     Status: Abnormal   Collection Time: 05/12/23  9:43 AM  Result Value Ref Range   Retic Ct Pct 3.0 0.4 - 3.1 %   RBC. 2.68 (L) 3.87 - 5.11 MIL/uL   Retic Count, Absolute 79.3 19.0 - 186.0 K/uL   Immature Retic Fract 15.7 2.3 - 15.9 %    Comment: Performed at [facility], [address] [place], [name] [address]   CT Chest Wo Contrast Result Date: 05/12/2023 CLINICAL DATA:  53 year old female status post fall backwards yesterday. Cirrhosis. Syncope. Pain. EXAM: CT CHEST WITHOUT CONTRAST TECHNIQUE: Multidetector CT imaging of the chest was performed following the standard protocol without IV contrast. RADIATION DOSE REDUCTION: This exam was performed according to the departmental dose-optimization program which includes automated exposure control, adjustment of the mA and/or kV according to patient size and/or use of iterative reconstruction technique. COMPARISON:  Cervical spine CT today. CT Abdomen and Pelvis yesterday. FINDINGS: Cardiovascular: Calcified aortic atherosclerosis. Calcified coronary artery atherosclerosis on series 2, image 80. No cardiomegaly. No pericardial effusion. Vascular patency is not evaluated in the absence of IV contrast. Mediastinum/Nodes: Negative for mediastinal mass or lymphadenopathy. Lungs/Pleura: Major airways are patent. Normal lung volumes. Both lungs are clear. No pleural effusion. Upper Abdomen: Stable compared to the CT Abdomen and Pelvis yesterday. Musculoskeletal: Mild superior thoracic endplate compression fractures at T1, T3, T9 (moderate), T12. The latter 2 levels appear to be chronic. Underlying osteopenia. No retropulsion or complicating features. No rib fracture identified. Sternum and visible shoulder osseous structures appear intact. IMPRESSION: 1. Osteopenia with multiple generally mild thoracic compression fractures, T1, T3, T9, and T12. T1 and T3 appear age indeterminate. 2. No other acute traumatic injury identified in the noncontrast  Chest. 3. Calcified coronary artery and Aortic Atherosclerosis (ICD10-I70.0). 4. Upper abdomen stable from CT Abdomen and Pelvis yesterday. Electronically Signed   By: Marlise Simpers M.D.   On: 05/12/2023 10:23   CT Cervical Spine Wo Contrast Result Date: 05/12/2023 CLINICAL DATA:  53 year old female status post fall backwards yesterday. Cirrhosis. Syncope. Pain. EXAM: CT CERVICAL SPINE WITHOUT CONTRAST TECHNIQUE: Multidetector CT imaging of the cervical spine was performed without intravenous contrast. Multiplanar CT image reconstructions were also generated. RADIATION DOSE REDUCTION: This exam was performed according to the departmental dose-optimization program which includes automated exposure control, adjustment of the mA and/or kV according to patient size and/or use of iterative reconstruction technique. COMPARISON:  Head CT today.  Chest CT reported separately. FINDINGS: Alignment: Straightening of cervical lordosis. Cervicothoracic junction alignment is within normal limits. Mild degenerative appearing retrolisthesis of C5 on C6 with associated disc space loss. Bilateral posterior element alignment is within normal limits. Skull base and vertebrae: Osteopenia. Visualized skull base is intact. No atlanto-occipital dissociation. C1 and C2 appear intact and aligned. No acute osseous abnormality identified. Soft tissues and spinal canal: No prevertebral fluid or swelling. No visible canal hematoma. Negative visible noncontrast neck soft tissues. Disc levels: Mild for age cervical spine degeneration except at C5-C6 where disc space loss and asymmetric endplate spurring is associated with mild retrolisthesis. Probably no significant cervical spinal stenosis by CT. Upper chest: Subtle T1 superior endplate compression is age indeterminate. No retropulsion or complicating features. Lung apices are clear. IMPRESSION: 1. No acute traumatic injury identified in the cervical spine. 2. Osteopenia.  Chronic degeneration at  C5-C6. 3. Mild T1 superior endplate compression fracture is age indeterminate. Electronically Signed   By: Marlise Simpers M.D.   On: 05/12/2023 10:19   CT Head  Wo Contrast Result Date: 05/12/2023 CLINICAL DATA:  53 year old female status post fall backwards yesterday. Cirrhosis. Syncope. Pain. EXAM: CT HEAD WITHOUT CONTRAST TECHNIQUE: Contiguous axial images were obtained from the base of the skull through the vertex without intravenous contrast. RADIATION DOSE REDUCTION: This exam was performed according to the departmental dose-optimization program which includes automated exposure control, adjustment of the mA and/or kV according to patient size and/or use of iterative reconstruction technique. COMPARISON:  None Available. FINDINGS: Brain: Cerebral volume probably at the lower limits of normal for age. No midline shift, ventriculomegaly, mass effect, evidence of mass lesion, intracranial hemorrhage or evidence of cortically based acute infarction. Patchy, symmetric moderate bilateral cerebral white matter hypodensity which is mostly periventricular. Vascular: No suspicious intracranial vascular hyperdensity. Calcified atherosclerosis at the skull base. Skull: Osteopenia. Intact. No acute osseous abnormality identified. Sinuses/Orbits: Visualized paranasal sinuses and mastoids are clear. Other: No acute orbit or scalp soft tissue injury identified. IMPRESSION: 1. No acute traumatic injury identified. 2. Moderate cerebral white matter changes, most commonly due to small vessel disease. Electronically Signed   By: Marlise Simpers M.D.   On: 05/12/2023 10:17   CT ABDOMEN PELVIS W CONTRAST Result Date: 05/11/2023 CLINICAL DATA:  Acute generalized abdominal pain, nausea, vomiting. EXAM: CT ABDOMEN AND PELVIS WITH CONTRAST TECHNIQUE: Multidetector CT imaging of the abdomen and pelvis was performed using the standard protocol following bolus administration of intravenous contrast. RADIATION DOSE REDUCTION: This exam was performed  according to the departmental dose-optimization program which includes automated exposure control, adjustment of the mA and/or kV according to patient size and/or use of iterative reconstruction technique. CONTRAST:  OMNIPAQUE  IOHEXOL  300 MG/ML  SOLN COMPARISON:  April 14, 2023.  August 14, 2020. FINDINGS: Lower chest: No acute abnormality. Hepatobiliary: Nodular hepatic contours are noted consistent with hepatic cirrhosis. No cholelithiasis or biliary dilatation is noted. Patent periumbilical vein is noted consistent with portal hypertension. Stable probable hemangioma seen inferiorly in right hepatic lobe. Pancreas: Unremarkable. No pancreatic ductal dilatation or surrounding inflammatory changes. Spleen: Normal in size without focal abnormality. Adrenals/Urinary Tract: Adrenal glands are unremarkable. Kidneys are normal, without renal calculi, focal lesion, or hydronephrosis. Bladder is unremarkable. Stomach/Bowel: Wall thickening of gastric antrum is noted suggesting possible inflammation or peptic ulcer disease. There is no evidence of bowel obstruction. The appendix is unremarkable. Vascular/Lymphatic: Aortic atherosclerosis. No enlarged abdominal or pelvic lymph nodes. Reproductive: Uterus and bilateral adnexa are unremarkable. Other: No ascites is noted. Musculoskeletal: No acute or significant osseous findings. IMPRESSION: Hepatic cirrhosis is noted with recanalized periumbilical vein consistent with portal hypertension. Wall thickening of gastric antrum is noted suggesting possible inflammation or peptic ulcer disease. Aortic Atherosclerosis (ICD10-I70.0). Electronically Signed   By: Rosalene Colon M.D.   On: 05/11/2023 17:07    Pending Labs Unresulted Labs (From admission, onward)    None       Vitals/Pain Today's Vitals   05/12/23 0912 05/12/23 1028 05/12/23 1029 05/12/23 1128  BP: 104/67 123/77    Pulse: (!) 101     Resp: 18  (!) 24   Temp: 98.2 F (36.8 C)     SpO2: 100%      Weight: 56.7 kg     Height: 5\' 2"  (1.575 m)     PainSc: 9    10-Worst pain ever    Isolation Precautions No active isolations  Medications Medications  potassium chloride  10 mEq in 100 mL IVPB (10 mEq Intravenous New Bag/Given 05/12/23 1239)  octreotide (SANDOSTATIN) 2 mcg/mL load via  infusion 25 mcg (has no administration in time range)    And  octreotide (SANDOSTATIN) 500 mcg in sodium chloride  0.9 % 250 mL (2 mcg/mL) infusion (has no administration in time range)  cefTRIAXone (ROCEPHIN) 1 g in sodium chloride  0.9 % 100 mL IVPB (1 g Intravenous New Bag/Given 05/12/23 1238)  sodium chloride  0.9 % bolus 1,000 mL (1,000 mLs Intravenous New Bag/Given 05/12/23 1127)  ondansetron  (ZOFRAN ) injection 4 mg (4 mg Intravenous Given 05/12/23 1128)  pantoprazole  (PROTONIX ) injection 40 mg (40 mg Intravenous Given 05/12/23 1128)  fentaNYL (SUBLIMAZE) injection 25 mcg (25 mcg Intravenous Given 05/12/23 1128)    Mobility walks with person assist     Focused Assessments     R Recommendations: See Admitting Provider Note  Report given to:   Additional Notes:

## 2023-05-12 NOTE — ED Provider Notes (Signed)
 [place] EMERGENCY DEPARTMENT AT [facility] Provider Note   CSN: [id number] Arrival date & time: 05/12/23  4098     History  Chief Complaint  Patient presents with   Loss of Consciousness   Back Pain   Bloated    Joan Wood is a 53 y.o. female.   Loss of Consciousness Associated symptoms: chest pain (left rib pain), dizziness, nausea, vomiting and weakness   Associated symptoms: no fever, no headaches and no shortness of breath   Back Pain Associated symptoms: abdominal pain, chest pain (left rib pain) and weakness   Associated symptoms: no dysuria, no fever, no headaches and no numbness        Joan Wood is a 53 y.o. female with past medical history of SVT, hypertension, anxiety, GERD, IBS with diarrhea, pancreatitis who presents to the Emergency Department today after evaluation here yesterday.  Patient was found to have hypokalemia and hyponatremia with cirrhosis.  Patient was recommended hospital admission but refused.  Returns today stating that she is "ready to be admitted."  States she is continue to have vomiting and diarrhea.  States her stools have been dark and tarry in appearance for 1 month.  She does endorse taking some Pepto-Bismol several days ago.  Was attempting to ambulate to the restroom this morning became weak and dizzy and fell.  Believes that she struck her left ribs on a chair as she fell.  She is unsure if she lost consciousness.  Does not take blood thinners.  She does endorse alcohol use, states that she has "cut back lately" averages drinking 2 to 3 cans of beer 2-3 times per week   Home Medications Prior to Admission medications   Medication Sig Start Date End Date Taking? Authorizing Provider  acetaminophen  (TYLENOL ) 500 MG tablet Take 500 mg by mouth every 6 (six) hours as needed for moderate pain (pain score 4-6).    [provider]  bismuth subsalicylate (PEPTO BISMOL) 262 MG/15ML suspension Take 30 mLs by mouth  every 6 (six) hours as needed for diarrhea or loose stools.    [provider]  Cholecalciferol  (VITAMIN D3) 25 MCG (1000 UT) CAPS Take 1 capsule (1,000 Units total) by mouth daily. 05/02/22   Zarwolo, Gloria, FNP  diphenhydramine-acetaminophen  (TYLENOL  PM) 25-500 MG TABS tablet Take 1 tablet by mouth at bedtime as needed (Pain/sleep).    [provider]  folic acid  (FOLVITE ) 1 MG tablet Take 1 tablet (1 mg total) by mouth daily. 03/29/23   Zarwolo, Gloria, FNP  gabapentin  (NEURONTIN ) 100 MG capsule Take 100 mg by mouth daily as needed (Pain).    [provider]  loperamide  (IMODIUM  A-D) 2 MG tablet Take 1 tablet (2 mg total) by mouth 4 (four) times daily as needed for diarrhea or loose stools. 03/29/23   Zarwolo, Gloria, FNP  meloxicam  (MOBIC ) 15 MG tablet Take 1 tablet (15 mg total) by mouth daily. Patient taking differently: Take 15 mg by mouth daily as needed for pain. 03/29/23   Zarwolo, Gloria, FNP  omeprazole  (PRILOSEC) 40 MG capsule Take 1 capsule (40 mg total) by mouth daily. Patient not taking: Reported on 05/11/2023 04/11/23   Eustacio Highman, NP  ondansetron  (ZOFRAN ) 4 MG tablet Take 1 tablet (4 mg total) by mouth 2 (two) times daily as needed for nausea or vomiting. 04/23/23   Alison Irvine, FNP  pyridOXINE (VITAMIN B6) 50 MG tablet Take 1 tablet (50 mg total) by mouth daily. 03/29/23   Zarwolo,  Art Bigness, FNP  Vitamin D , Ergocalciferol , (DRISDOL ) 1.25 MG (50000 UNIT) CAPS capsule Take 1 capsule (50,000 Units total) by mouth every 7 (seven) days. Patient not taking: Reported on 05/11/2023 04/15/23   Zarwolo, Gloria, FNP      Allergies    Bee venom and Latex    Review of Systems   Review of Systems  Constitutional:  Negative for appetite change, chills and fever.  HENT:  Negative for congestion.   Respiratory:  Negative for shortness of breath.   Cardiovascular:  Positive for chest pain (left rib pain) and syncope.  Gastrointestinal:  Positive for abdominal pain,  diarrhea, nausea and vomiting.  Genitourinary:  Negative for dysuria.  Musculoskeletal:  Positive for back pain.  Skin:  Negative for wound.  Neurological:  Positive for dizziness, syncope and weakness. Negative for facial asymmetry, numbness and headaches.    Physical Exam Updated Vital Signs BP 104/67   Pulse (!) 101   Temp 98.2 F (36.8 C)   Resp 18   Ht 5\' 2"  (1.575 m)   Wt 56.7 kg   SpO2 100%   BMI 22.86 kg/m  Physical Exam Vitals and nursing note reviewed.  Constitutional:      General: She is not in acute distress.    Appearance: She is not ill-appearing or toxic-appearing.  HENT:     Head: Atraumatic.     Mouth/Throat:     Mouth: Mucous membranes are moist.  Eyes:     Extraocular Movements: Extraocular movements intact.     Conjunctiva/sclera: Conjunctivae normal.     Pupils: Pupils are equal, round, and reactive to light.  Cardiovascular:     Rate and Rhythm: Regular rhythm. Tachycardia present.     Pulses: Normal pulses.  Pulmonary:     Effort: Pulmonary effort is normal.     Breath sounds: Normal breath sounds.  Chest:     Chest wall: Tenderness (Focal tenderness to palpation posterior lateral left ribs.  No crepitus) present.  Abdominal:     Palpations: Abdomen is soft.     Tenderness: There is abdominal tenderness. There is no right CVA tenderness or left CVA tenderness.     Comments: Mild epigastric tenderness to palpation.  No guarding or rebound.  Abdomen is soft  Genitourinary:    Rectum: External hemorrhoid present.     Comments: Patient has large nonthrombosed external hemorrhoids, attempted DRE, poorly tolerated by patient.  No stool palpable in rectal vault Musculoskeletal:        General: Normal range of motion.     Cervical back: Tenderness present. No rigidity.     Comments: Patient has Ace wrap applied to right ankle and foot.  Has history of fracture of the ankle and toes.  Toes of the right foot are ecchymotic, palpable dorsalis pedis  pulse.  Compartments are soft.  Good cap refill.  Lymphadenopathy:     Cervical: No cervical adenopathy.  Skin:    General: Skin is warm.     Capillary Refill: Capillary refill takes less than 2 seconds.  Neurological:     General: No focal deficit present.     Mental Status: She is alert.     Sensory: No sensory deficit.     Motor: No weakness.     ED Results / Procedures / Treatments   Labs (all labs ordered are listed, but only abnormal results are displayed) Labs Reviewed  CBC WITH DIFFERENTIAL/PLATELET - Abnormal; Notable for the following components:      Result Value  RBC 2.66 (*)    Hemoglobin 9.5 (*)    HCT 26.5 (*)    MCH 35.7 (*)    Platelets 91 (*)    All other components within normal limits  COMPREHENSIVE METABOLIC PANEL WITH GFR - Abnormal; Notable for the following components:   Sodium 118 (*)    Potassium 2.8 (*)    Chloride 83 (*)    Creatinine, Ser 0.43 (*)    Calcium 8.5 (*)    Albumin 3.3 (*)    AST 120 (*)    Total Bilirubin 1.9 (*)    All other components within normal limits  ETHANOL - Abnormal; Notable for the following components:   Alcohol, Ethyl (B) 132 (*)    All other components within normal limits  MAGNESIUM   POC OCCULT BLOOD, ED    EKG EKG Interpretation Date/Time:  Saturday May 12 2023 09:44:38 EDT Ventricular Rate:  99 PR Interval:    QRS Duration:  131 QT Interval:  414 QTC Calculation: 532 R Axis:   86  Text Interpretation: Sinus rhythm Artifact Confirmed by Dorenda Gandy [id number]) on 05/12/2023 9:54:38 AM  Radiology CT Chest Wo Contrast Result Date: 05/12/2023 CLINICAL DATA:  53 year old female status post fall backwards yesterday. Cirrhosis. Syncope. Pain. EXAM: CT CHEST WITHOUT CONTRAST TECHNIQUE: Multidetector CT imaging of the chest was performed following the standard protocol without IV contrast. RADIATION DOSE REDUCTION: This exam was performed according to the departmental dose-optimization program which includes  automated exposure control, adjustment of the mA and/or kV according to patient size and/or use of iterative reconstruction technique. COMPARISON:  Cervical spine CT today. CT Abdomen and Pelvis yesterday. FINDINGS: Cardiovascular: Calcified aortic atherosclerosis. Calcified coronary artery atherosclerosis on series 2, image 80. No cardiomegaly. No pericardial effusion. Vascular patency is not evaluated in the absence of IV contrast. Mediastinum/Nodes: Negative for mediastinal mass or lymphadenopathy. Lungs/Pleura: Major airways are patent. Normal lung volumes. Both lungs are clear. No pleural effusion. Upper Abdomen: Stable compared to the CT Abdomen and Pelvis yesterday. Musculoskeletal: Mild superior thoracic endplate compression fractures at T1, T3, T9 (moderate), T12. The latter 2 levels appear to be chronic. Underlying osteopenia. No retropulsion or complicating features. No rib fracture identified. Sternum and visible shoulder osseous structures appear intact. IMPRESSION: 1. Osteopenia with multiple generally mild thoracic compression fractures, T1, T3, T9, and T12. T1 and T3 appear age indeterminate. 2. No other acute traumatic injury identified in the noncontrast Chest. 3. Calcified coronary artery and Aortic Atherosclerosis (ICD10-I70.0). 4. Upper abdomen stable from CT Abdomen and Pelvis yesterday. Electronically Signed   By: Marlise Simpers M.D.   On: 05/12/2023 10:23   CT Cervical Spine Wo Contrast Result Date: 05/12/2023 CLINICAL DATA:  53 year old female status post fall backwards yesterday. Cirrhosis. Syncope. Pain. EXAM: CT CERVICAL SPINE WITHOUT CONTRAST TECHNIQUE: Multidetector CT imaging of the cervical spine was performed without intravenous contrast. Multiplanar CT image reconstructions were also generated. RADIATION DOSE REDUCTION: This exam was performed according to the departmental dose-optimization program which includes automated exposure control, adjustment of the mA and/or kV according to  patient size and/or use of iterative reconstruction technique. COMPARISON:  Head CT today.  Chest CT reported separately. FINDINGS: Alignment: Straightening of cervical lordosis. Cervicothoracic junction alignment is within normal limits. Mild degenerative appearing retrolisthesis of C5 on C6 with associated disc space loss. Bilateral posterior element alignment is within normal limits. Skull base and vertebrae: Osteopenia. Visualized skull base is intact. No atlanto-occipital dissociation. C1 and C2 appear intact and aligned. No acute osseous  abnormality identified. Soft tissues and spinal canal: No prevertebral fluid or swelling. No visible canal hematoma. Negative visible noncontrast neck soft tissues. Disc levels: Mild for age cervical spine degeneration except at C5-C6 where disc space loss and asymmetric endplate spurring is associated with mild retrolisthesis. Probably no significant cervical spinal stenosis by CT. Upper chest: Subtle T1 superior endplate compression is age indeterminate. No retropulsion or complicating features. Lung apices are clear. IMPRESSION: 1. No acute traumatic injury identified in the cervical spine. 2. Osteopenia.  Chronic degeneration at C5-C6. 3. Mild T1 superior endplate compression fracture is age indeterminate. Electronically Signed   By: Marlise Simpers M.D.   On: 05/12/2023 10:19   CT Head Wo Contrast Result Date: 05/12/2023 CLINICAL DATA:  53 year old female status post fall backwards yesterday. Cirrhosis. Syncope. Pain. EXAM: CT HEAD WITHOUT CONTRAST TECHNIQUE: Contiguous axial images were obtained from the base of the skull through the vertex without intravenous contrast. RADIATION DOSE REDUCTION: This exam was performed according to the departmental dose-optimization program which includes automated exposure control, adjustment of the mA and/or kV according to patient size and/or use of iterative reconstruction technique. COMPARISON:  None Available. FINDINGS: Brain: Cerebral  volume probably at the lower limits of normal for age. No midline shift, ventriculomegaly, mass effect, evidence of mass lesion, intracranial hemorrhage or evidence of cortically based acute infarction. Patchy, symmetric moderate bilateral cerebral white matter hypodensity which is mostly periventricular. Vascular: No suspicious intracranial vascular hyperdensity. Calcified atherosclerosis at the skull base. Skull: Osteopenia. Intact. No acute osseous abnormality identified. Sinuses/Orbits: Visualized paranasal sinuses and mastoids are clear. Other: No acute orbit or scalp soft tissue injury identified. IMPRESSION: 1. No acute traumatic injury identified. 2. Moderate cerebral white matter changes, most commonly due to small vessel disease. Electronically Signed   By: Marlise Simpers M.D.   On: 05/12/2023 10:17   CT ABDOMEN PELVIS W CONTRAST Result Date: 05/11/2023 CLINICAL DATA:  Acute generalized abdominal pain, nausea, vomiting. EXAM: CT ABDOMEN AND PELVIS WITH CONTRAST TECHNIQUE: Multidetector CT imaging of the abdomen and pelvis was performed using the standard protocol following bolus administration of intravenous contrast. RADIATION DOSE REDUCTION: This exam was performed according to the departmental dose-optimization program which includes automated exposure control, adjustment of the mA and/or kV according to patient size and/or use of iterative reconstruction technique. CONTRAST:  OMNIPAQUE  IOHEXOL  300 MG/ML  SOLN COMPARISON:  April 14, 2023.  August 14, 2020. FINDINGS: Lower chest: No acute abnormality. Hepatobiliary: Nodular hepatic contours are noted consistent with hepatic cirrhosis. No cholelithiasis or biliary dilatation is noted. Patent periumbilical vein is noted consistent with portal hypertension. Stable probable hemangioma seen inferiorly in right hepatic lobe. Pancreas: Unremarkable. No pancreatic ductal dilatation or surrounding inflammatory changes. Spleen: Normal in size without focal  abnormality. Adrenals/Urinary Tract: Adrenal glands are unremarkable. Kidneys are normal, without renal calculi, focal lesion, or hydronephrosis. Bladder is unremarkable. Stomach/Bowel: Wall thickening of gastric antrum is noted suggesting possible inflammation or peptic ulcer disease. There is no evidence of bowel obstruction. The appendix is unremarkable. Vascular/Lymphatic: Aortic atherosclerosis. No enlarged abdominal or pelvic lymph nodes. Reproductive: Uterus and bilateral adnexa are unremarkable. Other: No ascites is noted. Musculoskeletal: No acute or significant osseous findings. IMPRESSION: Hepatic cirrhosis is noted with recanalized periumbilical vein consistent with portal hypertension. Wall thickening of gastric antrum is noted suggesting possible inflammation or peptic ulcer disease. Aortic Atherosclerosis (ICD10-I70.0). Electronically Signed   By: Rosalene Colon M.D.   On: 05/11/2023 17:07    Procedures Procedures  Medications Ordered in ED Medications - No data to display  ED Course/ Medical Decision Making/ A&P                                 Medical Decision Making Patient seen here yesterday for nausea vomiting diarrhea and abdominal pain.  She had labs and CT of the abdomen and pelvis that showed likely cirrhosis.  Also found to be hypovolemic and hyponatremic.  She was given IV potassium here with fluids and admission to the hospital was recommended but patient refused.  Return today for similar symptoms, but also states that she had syncopal episode and fell and now has pain to the posterior lateral left ribs.  Syncopal episode likely related to volume loss.  Suspect continued electrolyte derangement.  Low clinical concern for infectious process.  Will repeat her labs today, will likely need admission and patient is now agreeable.    Amount and/or Complexity of Data Reviewed Labs: ordered.    Details: Labs today show worsening of her hyponatremia and hypokalemia from  yesterday.  She is also had a near 1 point drop in her hemoglobin since yesterday.  DRE was attempted but patient poorly tolerated and could not appreciate any stool in the rectal vault.  Anemia panel pending.  Her magnesium  is reassuring.  Blood alcohol elevated at 132 Radiology: ordered.    Details: CT of the chest was ordered due to patient's fall from earlier today.  Shows osteopenia with thoracic compression fractures of T1 3 9 and 12.  CT head and C-spine without acute intracranial abnormality or acute fracture or listhesis Discussion of management or test interpretation with external provider(s):  Discussed with GI, Dr. Sammi Crick who will see patient in consultation with plan for likely endoscopy/colonoscopy.  He recommends ceftriaxone, octreotide, patient may have clear fluids for now and PPI twice daily.  Anticipate scoping after improvement of her hyponatremia  Discussed with [facility] hospitalist who agrees to admit    Risk Prescription drug management. Decision regarding hospitalization.           Final Clinical Impression(s) / ED Diagnoses Final diagnoses:  Hyponatremia  Hypokalemia  Fall in home, initial encounter    Rx / DC Orders ED Discharge Orders     None         Catherne Clubs, PA-C 05/12/23 1410    Dorenda Gandy, MD 05/12/23 [phone]

## 2023-05-12 NOTE — Progress Notes (Signed)
   05/12/23 1839  TOC Brief Assessment  Insurance and Status Reviewed  Patient has primary care physician Yes  Home environment has been reviewed Single Home  Prior level of function: Independent  Prior/Current Home Services No current home services  Social Drivers of Health Review SDOH reviewed no interventions necessary  Readmission risk has been reviewed Yes  Transition of care needs no transition of care needs at this time   Acute hyponatremia . Transition of Care Department [facility]) has reviewed patient and no TOC needs have been identified at this time. We will continue to monitor patient advancement through interdisciplinary progression rounds. If new patient transition needs arise, please place a TOC consult.

## 2023-05-12 NOTE — ED Notes (Signed)
 Got pt up to bathroom. Pt walks well with cane and one assist. Endorses pain

## 2023-05-12 NOTE — ED Triage Notes (Signed)
 Pt to ED states she was seen yesterday and was suggested that she be admitted and "I refused".  Pt states was diagnosed with cirrhosis.  States this AM had a syncopal episode falling and now has back and left hip pain.  Pt states wanting to be admitted now.

## 2023-05-12 NOTE — Progress Notes (Signed)
 Date and time results received: 05/12/23 1536 (use smartphrase ".now" to insert current time)  Test: MRSA PCR nasal Critical Value: Positive  Name of Provider Notified: Dr. Mason Sole  Orders Received? Or Actions Taken?: Standing order mupirocin ointment implemented.

## 2023-05-12 NOTE — H&P (Addendum)
 History and Physical    Joan Wood VVO:[id number] DOB: 1970-08-23 DOA: 05/12/2023  PCP: Zarwolo, Gloria, FNP   Patient coming from: Home  Chief Complaint: N/V/D with abdominal pain  HPI: Joan Wood is a 53 y.o. female with medical history significant for anxiety, GERD, abdominal wall hernia, prior SVT, and hypertension who presented to the ED today with abdominal pain along with nausea and vomiting over the last along with nausea and vomiting over the last 3-4 days.  She has also had severe nonbloody diarrhea and has had sick contact with a roommate who had similar symptoms, but she states that this was related to diabetic episode.  She was seen in the ED on 5/2, but then decided to leave AMA and presented back today.  At that time she was noted to have hyponatremia which now appears to be worsening and hemoglobin levels are dropping.  She has noted to have dark, tarry stools at this point as well.  She drinks alcohol on a consistent basis, but appears to have cut back recently and drinks about 2 beers per day.  She does smoke 8-10 cigarettes daily.  She did fall recently and appears to have some old compression fractures in her thoracic spine, but no new injuries.   ED Course: Vital signs with some tachycardia noted and sodium 118, potassium 2.8 and platelet count of 91,000.  CT studies with no acute findings noted, but there are age-indeterminate vertebral compression fractures throughout the thoracic spine that appear to be related to osteopenia.  Alcohol level 132.  She was given a bolus of normal saline as well as some potassium supplementation and GI was called by EDP with recommendations to maintain on PPI as well as clear liquid diet and correct sodium levels further prior to endoscopy.  Review of Systems: Reviewed as noted above, otherwise negative.  Past Medical History:  Diagnosis Date   Anxiety    GERD (gastroesophageal reflux disease)    Hernia of abdominal wall    HTN  (hypertension)    SVT (supraventricular tachycardia) (HCC)     Past Surgical History:  Procedure Laterality Date   FINGER FRACTURE SURGERY     right ankle repair     age 66     reports that she has been smoking cigarettes. She started smoking about 14 months ago. She has a 28.3 pack-year smoking history. She has never used smokeless tobacco. She reports current alcohol use. She reports that she does not use drugs.  Allergies  Allergen Reactions   Bee Venom    Latex Rash    Family History  Problem Relation Age of Onset   Cancer Mother    Leukemia Mother    Multiple myeloma Mother    Cancer Father        unsure what kind   Cancer - Colon Neg Hx    Colon polyps Neg Hx     Prior to Admission medications   Medication Sig Start Date End Date Taking? Authorizing Provider  acetaminophen  (TYLENOL ) 500 MG tablet Take 500 mg by mouth every 6 (six) hours as needed for moderate pain (pain score 4-6).    [provider]  bismuth subsalicylate (PEPTO BISMOL) 262 MG/15ML suspension Take 30 mLs by mouth every 6 (six) hours as needed for diarrhea or loose stools.    [provider]  Cholecalciferol  (VITAMIN D3) 25 MCG (1000 UT) CAPS Take 1 capsule (1,000 Units total) by mouth daily. 05/02/22   Zarwolo, Gloria, FNP  diphenhydramine-acetaminophen  (TYLENOL  PM) 25-500 MG TABS tablet Take 1 tablet by mouth at bedtime as needed (Pain/sleep).    [provider]  folic acid  (FOLVITE ) 1 MG tablet Take 1 tablet (1 mg total) by mouth daily. 03/29/23   Zarwolo, Gloria, FNP  gabapentin  (NEURONTIN ) 100 MG capsule Take 100 mg by mouth daily as needed (Pain).    [provider]  loperamide  (IMODIUM  A-D) 2 MG tablet Take 1 tablet (2 mg total) by mouth 4 (four) times daily as needed for diarrhea or loose stools. 03/29/23   Zarwolo, Gloria, FNP  meloxicam  (MOBIC ) 15 MG tablet Take 1 tablet (15 mg total) by mouth daily. Patient taking differently: Take 15 mg by mouth daily as  needed for pain. 03/29/23   Zarwolo, Gloria, FNP  omeprazole  (PRILOSEC) 40 MG capsule Take 1 capsule (40 mg total) by mouth daily. Patient not taking: Reported on 05/11/2023 04/11/23   Eustacio Highman, NP  ondansetron  (ZOFRAN ) 4 MG tablet Take 1 tablet (4 mg total) by mouth 2 (two) times daily as needed for nausea or vomiting. 04/23/23   Alison Irvine, FNP  pyridOXINE (VITAMIN B6) 50 MG tablet Take 1 tablet (50 mg total) by mouth daily. 03/29/23   Zarwolo, Gloria, FNP  Vitamin D , Ergocalciferol , (DRISDOL ) 1.25 MG (50000 UNIT) CAPS capsule Take 1 capsule (50,000 Units total) by mouth every 7 (seven) days. Patient not taking: Reported on 05/11/2023 04/15/23   Zarwolo, Gloria, FNP    Physical Exam: Vitals:   05/12/23 0912 05/12/23 1028 05/12/23 1029  BP: 104/67 123/77   Pulse: (!) 101    Resp: 18  (!) 24  Temp: 98.2 F (36.8 C)    SpO2: 100%    Weight: 56.7 kg    Height: 5\' 2"  (1.575 m)      Constitutional: NAD, calm, comfortable Vitals:   05/12/23 0912 05/12/23 1028 05/12/23 1029  BP: 104/67 123/77   Pulse: (!) 101    Resp: 18  (!) 24  Temp: 98.2 F (36.8 C)    SpO2: 100%    Weight: 56.7 kg    Height: 5\' 2"  (1.575 m)     Eyes: lids and conjunctivae normal Neck: normal, supple Respiratory: clear to auscultation bilaterally. Normal respiratory effort. No accessory muscle use.  Cardiovascular: Regular rate and rhythm, no murmurs. Abdomen: no tenderness, no distention. Bowel sounds positive.  Musculoskeletal:  No edema. Skin: no rashes, lesions, ulcers.  Psychiatric: Flat affect  Labs on Admission: I have personally reviewed following labs and imaging studies  CBC: Recent Labs  Lab 05/11/23 1512 05/12/23 0940  WBC 6.0 4.6  NEUTROABS 4.0 3.4  HGB 10.8* 9.5*  HCT 30.1* 26.5*  MCV 100.0 99.6  PLT 100* 91*   Basic Metabolic Panel: Recent Labs  Lab 05/11/23 1512 05/12/23 0940  NA 123* 118*  K 2.9* 2.8*  CL 84* 83*  CO2 24 22  GLUCOSE 97 91  BUN 23* 10  CREATININE 0.61  0.43*  CALCIUM 9.0 8.5*  MG  --  2.0   GFR: Estimated Creatinine Clearance: 64.3 mL/min (A) (by C-G formula based on SCr of 0.43 mg/dL (L)). Liver Function Tests: Recent Labs  Lab 05/11/23 1512 05/12/23 0940  AST 99* 120*  ALT 38 40  ALKPHOS 74 72  BILITOT 2.2* 1.9*  PROT 8.0 7.4  ALBUMIN 3.5 3.3*   Recent Labs  Lab 05/11/23 1512  LIPASE 73*   No results for input(s): "AMMONIA" in the last 168 hours. Coagulation Profile: No results for input(s): "  INR", "PROTIME" in the last 168 hours. Cardiac Enzymes: No results for input(s): "CKTOTAL", "CKMB", "CKMBINDEX", "TROPONINI" in the last 168 hours. BNP (last 3 results) No results for input(s): "PROBNP" in the last 8760 hours. HbA1C: No results for input(s): "HGBA1C" in the last 72 hours. CBG: No results for input(s): "GLUCAP" in the last 168 hours. Lipid Profile: No results for input(s): "CHOL", "HDL", "LDLCALC", "TRIG", "CHOLHDL", "LDLDIRECT" in the last 72 hours. Thyroid  Function Tests: No results for input(s): "TSH", "T4TOTAL", "FREET4", "T3FREE", "THYROIDAB" in the last 72 hours. Anemia Panel: Recent Labs    05/12/23 0943  RETICCTPCT 3.0   Urine analysis:    Component Value Date/Time   COLORURINE YELLOW 05/11/2023 1334   APPEARANCEUR CLEAR 05/11/2023 1334   LABSPEC 1.017 05/11/2023 1334   PHURINE 7.0 05/11/2023 1334   GLUCOSEU NEGATIVE 05/11/2023 1334   HGBUR SMALL (A) 05/11/2023 1334   BILIRUBINUR NEGATIVE 05/11/2023 1334   KETONESUR 5 (A) 05/11/2023 1334   PROTEINUR NEGATIVE 05/11/2023 1334   NITRITE NEGATIVE 05/11/2023 1334   LEUKOCYTESUR NEGATIVE 05/11/2023 1334    Radiological Exams on Admission: CT Chest Wo Contrast Result Date: 05/12/2023 CLINICAL DATA:  53 year old female status post fall backwards yesterday. Cirrhosis. Syncope. Pain. EXAM: CT CHEST WITHOUT CONTRAST TECHNIQUE: Multidetector CT imaging of the chest was performed following the standard protocol without IV contrast. RADIATION DOSE  REDUCTION: This exam was performed according to the departmental dose-optimization program which includes automated exposure control, adjustment of the mA and/or kV according to patient size and/or use of iterative reconstruction technique. COMPARISON:  Cervical spine CT today. CT Abdomen and Pelvis yesterday. FINDINGS: Cardiovascular: Calcified aortic atherosclerosis. Calcified coronary artery atherosclerosis on series 2, image 80. No cardiomegaly. No pericardial effusion. Vascular patency is not evaluated in the absence of IV contrast. Mediastinum/Nodes: Negative for mediastinal mass or lymphadenopathy. Lungs/Pleura: Major airways are patent. Normal lung volumes. Both lungs are clear. No pleural effusion. Upper Abdomen: Stable compared to the CT Abdomen and Pelvis yesterday. Musculoskeletal: Mild superior thoracic endplate compression fractures at T1, T3, T9 (moderate), T12. The latter 2 levels appear to be chronic. Underlying osteopenia. No retropulsion or complicating features. No rib fracture identified. Sternum and visible shoulder osseous structures appear intact. IMPRESSION: 1. Osteopenia with multiple generally mild thoracic compression fractures, T1, T3, T9, and T12. T1 and T3 appear age indeterminate. 2. No other acute traumatic injury identified in the noncontrast Chest. 3. Calcified coronary artery and Aortic Atherosclerosis (ICD10-I70.0). 4. Upper abdomen stable from CT Abdomen and Pelvis yesterday. Electronically Signed   By: Marlise Simpers M.D.   On: 05/12/2023 10:23   CT Cervical Spine Wo Contrast Result Date: 05/12/2023 CLINICAL DATA:  53 year old female status post fall backwards yesterday. Cirrhosis. Syncope. Pain. EXAM: CT CERVICAL SPINE WITHOUT CONTRAST TECHNIQUE: Multidetector CT imaging of the cervical spine was performed without intravenous contrast. Multiplanar CT image reconstructions were also generated. RADIATION DOSE REDUCTION: This exam was performed according to the departmental  dose-optimization program which includes automated exposure control, adjustment of the mA and/or kV according to patient size and/or use of iterative reconstruction technique. COMPARISON:  Head CT today.  Chest CT reported separately. FINDINGS: Alignment: Straightening of cervical lordosis. Cervicothoracic junction alignment is within normal limits. Mild degenerative appearing retrolisthesis of C5 on C6 with associated disc space loss. Bilateral posterior element alignment is within normal limits. Skull base and vertebrae: Osteopenia. Visualized skull base is intact. No atlanto-occipital dissociation. C1 and C2 appear intact and aligned. No acute osseous abnormality identified. Soft tissues and spinal  canal: No prevertebral fluid or swelling. No visible canal hematoma. Negative visible noncontrast neck soft tissues. Disc levels: Mild for age cervical spine degeneration except at C5-C6 where disc space loss and asymmetric endplate spurring is associated with mild retrolisthesis. Probably no significant cervical spinal stenosis by CT. Upper chest: Subtle T1 superior endplate compression is age indeterminate. No retropulsion or complicating features. Lung apices are clear. IMPRESSION: 1. No acute traumatic injury identified in the cervical spine. 2. Osteopenia.  Chronic degeneration at C5-C6. 3. Mild T1 superior endplate compression fracture is age indeterminate. Electronically Signed   By: Marlise Simpers M.D.   On: 05/12/2023 10:19   CT Head Wo Contrast Result Date: 05/12/2023 CLINICAL DATA:  53 year old female status post fall backwards yesterday. Cirrhosis. Syncope. Pain. EXAM: CT HEAD WITHOUT CONTRAST TECHNIQUE: Contiguous axial images were obtained from the base of the skull through the vertex without intravenous contrast. RADIATION DOSE REDUCTION: This exam was performed according to the departmental dose-optimization program which includes automated exposure control, adjustment of the mA and/or kV according to  patient size and/or use of iterative reconstruction technique. COMPARISON:  None Available. FINDINGS: Brain: Cerebral volume probably at the lower limits of normal for age. No midline shift, ventriculomegaly, mass effect, evidence of mass lesion, intracranial hemorrhage or evidence of cortically based acute infarction. Patchy, symmetric moderate bilateral cerebral white matter hypodensity which is mostly periventricular. Vascular: No suspicious intracranial vascular hyperdensity. Calcified atherosclerosis at the skull base. Skull: Osteopenia. Intact. No acute osseous abnormality identified. Sinuses/Orbits: Visualized paranasal sinuses and mastoids are clear. Other: No acute orbit or scalp soft tissue injury identified. IMPRESSION: 1. No acute traumatic injury identified. 2. Moderate cerebral white matter changes, most commonly due to small vessel disease. Electronically Signed   By: Marlise Simpers M.D.   On: 05/12/2023 10:17   CT ABDOMEN PELVIS W CONTRAST Result Date: 05/11/2023 CLINICAL DATA:  Acute generalized abdominal pain, nausea, vomiting. EXAM: CT ABDOMEN AND PELVIS WITH CONTRAST TECHNIQUE: Multidetector CT imaging of the abdomen and pelvis was performed using the standard protocol following bolus administration of intravenous contrast. RADIATION DOSE REDUCTION: This exam was performed according to the departmental dose-optimization program which includes automated exposure control, adjustment of the mA and/or kV according to patient size and/or use of iterative reconstruction technique. CONTRAST:  OMNIPAQUE  IOHEXOL  300 MG/ML  SOLN COMPARISON:  April 14, 2023.  August 14, 2020. FINDINGS: Lower chest: No acute abnormality. Hepatobiliary: Nodular hepatic contours are noted consistent with hepatic cirrhosis. No cholelithiasis or biliary dilatation is noted. Patent periumbilical vein is noted consistent with portal hypertension. Stable probable hemangioma seen inferiorly in right hepatic lobe. Pancreas:  Unremarkable. No pancreatic ductal dilatation or surrounding inflammatory changes. Spleen: Normal in size without focal abnormality. Adrenals/Urinary Tract: Adrenal glands are unremarkable. Kidneys are normal, without renal calculi, focal lesion, or hydronephrosis. Bladder is unremarkable. Stomach/Bowel: Wall thickening of gastric antrum is noted suggesting possible inflammation or peptic ulcer disease. There is no evidence of bowel obstruction. The appendix is unremarkable. Vascular/Lymphatic: Aortic atherosclerosis. No enlarged abdominal or pelvic lymph nodes. Reproductive: Uterus and bilateral adnexa are unremarkable. Other: No ascites is noted. Musculoskeletal: No acute or significant osseous findings. IMPRESSION: Hepatic cirrhosis is noted with recanalized periumbilical vein consistent with portal hypertension. Wall thickening of gastric antrum is noted suggesting possible inflammation or peptic ulcer disease. Aortic Atherosclerosis (ICD10-I70.0). Electronically Signed   By: Rosalene Colon M.D.   On: 05/11/2023 17:07    EKG: Independently reviewed. SR 99bpm.  Assessment/Plan Principal Problem:  Acute hyponatremia Active Problems:   Alcohol use disorder   Periumbilical hernia   Irritable bowel syndrome with diarrhea   GERD (gastroesophageal reflux disease)   Nausea and vomiting   Hypokalemia    Acute, severe hyponatremia-multifactorial - Appears to be in the setting of dehydration as well as alcohol abuse - Continue to use normal saline for correction and monitor carefully - Check TSH, urine and serum osmolarity as well as urine sodium  Acute blood loss anemia possibly secondary to upper GI bleed -Possibly Mallory-Weiss tear in the setting of intractable nausea and vomiting - Appears to have dark stools, but FOBT could not be collected - Continue to monitor and transfuse for hemoglobin less than 7 - PPI twice daily - Clear liquid diet -Continue octreotide infusion given findings of  cirrhosis with portal hypertension - Appreciate GI evaluation  Intractable nausea vomiting and diarrhea with suspected acute gastroenteritis - Stool studies with GI pathogen panel for further evaluation - Noted to have prior history of pancreatitis, check lipase  Mild transaminitis possibly related to alcoholic hepatitis - AST/ALT ratio 2:1 - Continue to monitor carefully - Advised on alcohol cessation  Hypokalemia-severe - Related to GI losses, replete and monitor carefully - Maintain on telemetry  Thrombocytopenia - Appears to have finding of liver cirrhosis with portal hypertension that is the likely cause - Maintain on SCDs and monitor - Transfuse for significant worsening in the setting of acute bleed  Anxiety/alcohol abuse - CIWA protocol  GERD - Maintain on PPI twice daily as above  Recurrent falls/weakness - Fall precautions - PT/OT evaluation  Tobacco abuse - Working on quitting, counseled on cessation   DVT prophylaxis: SCDs Code Status: Full Family Communication:  Disposition Plan:Admit for GI bleed/electrolyte abnormalities Consults called: GI Admission status: Inpatient, SDU  Severity of Illness: The appropriate patient status for this patient is INPATIENT. Inpatient status is judged to be reasonable and necessary in order to provide the required intensity of service to ensure the patient's safety. The patient's presenting symptoms, physical exam findings, and initial radiographic and laboratory data in the context of their chronic comorbidities is felt to place them at high risk for further clinical deterioration. Furthermore, it is not anticipated that the patient will be medically stable for discharge from the hospital within 2 midnights of admission.   * I certify that at the point of admission it is my clinical judgment that the patient will require inpatient hospital care spanning beyond 2 midnights from the point of admission due to high intensity of  service, high risk for further deterioration and high frequency of surveillance required.*   Jeliyah Middlebrooks D Urban Naval DO [facility]  If 7PM-7AM, please contact night-coverage [email or web address]  05/12/2023, 12:46 PM

## 2023-05-12 NOTE — Consult Note (Signed)
 Samantha Cress, M.D. Gastroenterology & Hepatology                                           Patient Name: Joan Wood Account #: @FLAACCTNO @   MRN: [id number] Admission Date: 05/12/2023 Date of Evaluation:  05/12/2023 Time of Evaluation: 11:51 AM   Referring Physician: Doreene Gammon, DO  Chief Complaint: Melena and anemia, concern for cirrhosis  HPI:  This is a 53 y.o. female with a history of alcohol abuse, GERD, HTN, SVT, periumbilical hernia, possible alcoholic cirrhosis, IBS with diarrhea, who was admitted to the hospital after presenting worsening abdominal pain, nausea, vomiting and melena.  Gastroenterology was consulted for evaluation of possible upper gastrointestinal bleeding.  Patient is a poor historian. States that for the last month she has presented recurrent episodes of diarrhea multiple times per day and abdominal pain in the left flank, with nausea and vomiting.  She reports that for the last 5 days she has not had a bowel movement as she has been fearful of eating due to the diarrhea she has presented and nausea.  Patient states that she has seen for the last few weeks dark or tarry stools which tapered off recently.  No hematochezia.  No fever or chills.  She denies taking any NSAIDs or any anticoagulants.  Patient has been actively drinking alcohol for multiple years, previously was drinking up to 8 beers per day but has been decreasing the intake to possibly 3-4 beers per day.  Still smoking down from 1 pack/day to 7 cigarettes/day.  No drug use.  The patient follows with Dr. Mordechai April in the GI clinic.  Last time she was seen in the office was on 04/11/2023.  At that time she was scheduled to undergo EGD and colonoscopy but she has not this performed.  Per notes, she had a colonoscopy performed at [facility] in her 30s with benign polyps.  In the ED, she was tachycardic with heart rate in the low 100s to 120s range.  Blood pressure was normal 148/94.  Afebrile. Labs were  remarkable for hemoglobin of 9.5, platelets 91,000, normal WBC 4.6, sodium 118, potassium 2.8, chloride 83, creatinine 0.43, BUN 10, AST 120, ALT 40, total bilirubin 1.9, alkaline phosphatase 72.  Patient underwent a CT of her abdomen and pelvis with IV contrast yesterday which showed presence of changes of hepatic cirrhosis with recanalized periumbilical vein concerning for portal hypertension, wall thickening of the gastric antrum.  Had today a CT of the cervical spine without contrast which only showed osteopenia CT of the chest without contrast showed multiple mild thoracic compression fractures multiple segments.  CT of the head without contrast did not show any traumatic injury and moderate cerebral white matter changes due to small vessel disease.  Patient transferred to the ICU for further care of her severe hyponatremia.  Notably, the patient came to the ER yesterday and when she was told she was going to be admitted to the hospital she signed AMA.  Past Medical History: SEE CHRONIC ISSSUES: Past Medical History:  Diagnosis Date   Anxiety    GERD (gastroesophageal reflux disease)    Hernia of abdominal wall    HTN (hypertension)    SVT (supraventricular tachycardia) (HCC)    Past Surgical History:  Past Surgical History:  Procedure Laterality Date   FINGER FRACTURE SURGERY     right ankle repair  age 17   Family History:  Family History  Problem Relation Age of Onset   Cancer Mother    Leukemia Mother    Multiple myeloma Mother    Cancer Father        unsure what kind   Cancer - Colon Neg Hx    Colon polyps Neg Hx    Social History:  Social History   Tobacco Use   Smoking status: Every Day    Current packs/day: 0.50    Average packs/day: 0.7 packs/day for 38.2 years (28.3 ttl pk-yrs)    Types: Cigarettes    Start date: 03/2022   Smokeless tobacco: Never  Vaping Use   Vaping status: Never Used  Substance Use Topics   Alcohol use: Yes    Comment: beer, 4-6  /day   Drug use: Never    Home Medications:  Prior to Admission medications   Medication Sig Start Date End Date Taking? Authorizing Provider  acetaminophen  (TYLENOL ) 500 MG tablet Take 500 mg by mouth every 6 (six) hours as needed for moderate pain (pain score 4-6).    [provider]  bismuth subsalicylate (PEPTO BISMOL) 262 MG/15ML suspension Take 30 mLs by mouth every 6 (six) hours as needed for diarrhea or loose stools.    [provider]  Cholecalciferol  (VITAMIN D3) 25 MCG (1000 UT) CAPS Take 1 capsule (1,000 Units total) by mouth daily. 05/02/22   Zarwolo, Gloria, FNP  diphenhydramine-acetaminophen  (TYLENOL  PM) 25-500 MG TABS tablet Take 1 tablet by mouth at bedtime as needed (Pain/sleep).    [provider]  folic acid  (FOLVITE ) 1 MG tablet Take 1 tablet (1 mg total) by mouth daily. 03/29/23   Zarwolo, Gloria, FNP  gabapentin  (NEURONTIN ) 100 MG capsule Take 100 mg by mouth daily as needed (Pain).    [provider]  loperamide  (IMODIUM  A-D) 2 MG tablet Take 1 tablet (2 mg total) by mouth 4 (four) times daily as needed for diarrhea or loose stools. 03/29/23   Zarwolo, Gloria, FNP  meloxicam  (MOBIC ) 15 MG tablet Take 1 tablet (15 mg total) by mouth daily. Patient taking differently: Take 15 mg by mouth daily as needed for pain. 03/29/23   Zarwolo, Gloria, FNP  omeprazole  (PRILOSEC) 40 MG capsule Take 1 capsule (40 mg total) by mouth daily. Patient not taking: Reported on 05/11/2023 04/11/23   Eustacio Highman, NP  ondansetron  (ZOFRAN ) 4 MG tablet Take 1 tablet (4 mg total) by mouth 2 (two) times daily as needed for nausea or vomiting. 04/23/23   Alison Irvine, FNP  pyridOXINE (VITAMIN B6) 50 MG tablet Take 1 tablet (50 mg total) by mouth daily. 03/29/23   Zarwolo, Gloria, FNP  Vitamin D , Ergocalciferol , (DRISDOL ) 1.25 MG (50000 UNIT) CAPS capsule Take 1 capsule (50,000 Units total) by mouth every 7 (seven) days. Patient not taking: Reported on 05/11/2023 04/15/23    Zarwolo, Gloria, FNP    Inpatient Medications:  Current Facility-Administered Medications:    potassium chloride  10 mEq in 100 mL IVPB, 10 mEq, Intravenous, Q1 Hr x 3, Triplett, Tammy, PA-C, Last Rate: 100 mL/hr at 05/12/23 1135, 10 mEq at 05/12/23 1135  Current Outpatient Medications:    acetaminophen  (TYLENOL ) 500 MG tablet, Take 500 mg by mouth every 6 (six) hours as needed for moderate pain (pain score 4-6)., Disp: , Rfl:    bismuth subsalicylate (PEPTO BISMOL) 262 MG/15ML suspension, Take 30 mLs by mouth every 6 (six) hours as needed for diarrhea or loose stools., Disp: , Rfl:  Cholecalciferol  (VITAMIN D3) 25 MCG (1000 UT) CAPS, Take 1 capsule (1,000 Units total) by mouth daily., Disp: 90 capsule, Rfl: 0   diphenhydramine-acetaminophen  (TYLENOL  PM) 25-500 MG TABS tablet, Take 1 tablet by mouth at bedtime as needed (Pain/sleep)., Disp: , Rfl:    folic acid  (FOLVITE ) 1 MG tablet, Take 1 tablet (1 mg total) by mouth daily., Disp: 60 tablet, Rfl: 1   gabapentin  (NEURONTIN ) 100 MG capsule, Take 100 mg by mouth daily as needed (Pain)., Disp: , Rfl:    loperamide  (IMODIUM  A-D) 2 MG tablet, Take 1 tablet (2 mg total) by mouth 4 (four) times daily as needed for diarrhea or loose stools., Disp: 30 tablet, Rfl: 0   meloxicam  (MOBIC ) 15 MG tablet, Take 1 tablet (15 mg total) by mouth daily. (Patient taking differently: Take 15 mg by mouth daily as needed for pain.), Disp: 30 tablet, Rfl: 2   omeprazole  (PRILOSEC) 40 MG capsule, Take 1 capsule (40 mg total) by mouth daily. (Patient not taking: Reported on 05/11/2023), Disp: 90 capsule, Rfl: 3   ondansetron  (ZOFRAN ) 4 MG tablet, Take 1 tablet (4 mg total) by mouth 2 (two) times daily as needed for nausea or vomiting., Disp: 20 tablet, Rfl: 0   pyridOXINE (VITAMIN B6) 50 MG tablet, Take 1 tablet (50 mg total) by mouth daily., Disp: 30 tablet, Rfl: 1   Vitamin D , Ergocalciferol , (DRISDOL ) 1.25 MG (50000 UNIT) CAPS capsule, Take 1 capsule (50,000 Units total)  by mouth every 7 (seven) days. (Patient not taking: Reported on 05/11/2023), Disp: 25 capsule, Rfl: 1 Allergies: Bee venom and Latex  Complete Review of Systems: GENERAL: negative for malaise, night sweats HEENT: No changes in hearing or vision, no nose bleeds or other nasal problems. NECK: Negative for lumps, goiter, pain and significant neck swelling RESPIRATORY: Negative for cough, wheezing CARDIOVASCULAR: Negative for chest pain, leg swelling, palpitations, orthopnea GI: SEE HPI MUSCULOSKELETAL: Negative for joint pain or swelling, back pain, and muscle pain. SKIN: Negative for lesions, rash PSYCH: Negative for sleep disturbance, mood disorder and recent psychosocial stressors. HEMATOLOGY Negative for prolonged bleeding, bruising easily, and swollen nodes. ENDOCRINE: Negative for cold or heat intolerance, polyuria, polydipsia and goiter. NEURO: negative for tremor, gait imbalance, syncope and seizures. The remainder of the review of systems is noncontributory.  Physical Exam: BP 123/77   Pulse (!) 101   Temp 98.2 F (36.8 C)   Resp (!) 24   Ht 5\' 2"  (1.575 m)   Wt 56.7 kg   SpO2 100%   BMI 22.86 kg/m  GENERAL: The patient is AO x3, in no acute distress. HEENT: Head is normocephalic and atraumatic. EOMI are intact. Mouth is well hydrated and without lesions. NECK: Supple. No masses LUNGS: Clear to auscultation. No presence of rhonchi/wheezing/rales. Adequate chest expansion HEART: RRR, normal s1 and s2. ABDOMEN: Soft, nontender, no guarding, no peritoneal signs, and nondistended. BS +. No masses. EXTREMITIES: Without any cyanosis, clubbing, rash, lesions.  Has trace pitting edema in lower extremities. NEUROLOGIC: AOx3, no focal motor deficit. SKIN: no jaundice, no rashes  Laboratory Data CBC:     Component Value Date/Time   WBC 4.6 05/12/2023 0940   RBC 2.66 (L) 05/12/2023 0940   HGB 9.5 (L) 05/12/2023 0940   HGB 13.2 04/11/2023 1455   HCT 26.5 (L) 05/12/2023 0940    HCT 36.1 04/11/2023 1455   PLT 91 (L) 05/12/2023 0940   PLT 107 (L) 04/11/2023 1455   MCV 99.6 05/12/2023 0940   MCV 97 04/11/2023 1455  MCH 35.7 (H) 05/12/2023 0940   MCHC 35.8 05/12/2023 0940   RDW 13.0 05/12/2023 0940   RDW 11.7 04/11/2023 1455   LYMPHSABS 0.9 05/12/2023 0940   LYMPHSABS 1.7 04/03/2023 1041   MONOABS 0.3 05/12/2023 0940   EOSABS 0.0 05/12/2023 0940   EOSABS 0.0 04/03/2023 1041   BASOSABS 0.0 05/12/2023 0940   BASOSABS 0.0 04/03/2023 1041   COAG:  Lab Results  Component Value Date   INR 1.1 04/11/2023    BMP:     Latest Ref Rng & Units 05/12/2023    9:40 AM 05/11/2023    3:12 PM 04/11/2023    2:55 PM  BMP  Glucose 70 - 99 mg/dL 91  97  83   BUN 6 - 20 mg/dL 10  23  7    Creatinine 0.44 - 1.00 mg/dL 6.57  8.46  9.62   BUN/Creat Ratio 9 - 23   16   Sodium 135 - 145 mmol/L 118  123  133   Potassium 3.5 - 5.1 mmol/L 2.8  2.9  4.4   Chloride 98 - 111 mmol/L 83  84  97   CO2 22 - 32 mmol/L 22  24  21    Calcium 8.9 - 10.3 mg/dL 8.5  9.0  9.1     HEPATIC:     Latest Ref Rng & Units 05/12/2023    9:40 AM 05/11/2023    3:12 PM 04/11/2023    2:55 PM  Hepatic Function  Total Protein 6.5 - 8.1 g/dL 7.4  8.0  8.3   Albumin 3.5 - 5.0 g/dL 3.3  3.5  4.1   AST 15 - 41 U/L 120  99  131   ALT 0 - 44 U/L 40  38  40   Alk Phosphatase 38 - 126 U/L 72  74  101   Total Bilirubin 0.0 - 1.2 mg/dL 1.9  2.2  0.6     CARDIAC: No results found for: "CKTOTAL", "CKMB", "CKMBINDEX", "TROPONINI"   Imaging: I personally reviewed and interpreted the available imaging.  Assessment & Plan: AKITA KRUMWIEDE is a 53 y.o. female with a history of alcohol abuse, GERD, HTN, SVT, periumbilical hernia, possible alcoholic cirrhosis, IBS with diarrhea, who was admitted to the hospital after presenting worsening abdominal pain, nausea, vomiting and melena.  Gastroenterology was consulted for evaluation of possible upper gastrointestinal bleeding.  Patient was found to have features on imaging  concerning for cirrhosis.  It is very likely she has presented these change due to chronic alcohol abuse, for which I emphasized the importance of avoiding alcohol intake.  She is presenting with symptoms of upper gastrointestinal bleeding.  I doubt that these symptoms are primarily related to variceal bleeding as she is not severely decompensated, but we will cover possible variceal bleeding with ceftriaxone SBP prophylaxis and octreotide drip.  She will need to be on PPI twice daily IV.  She can stay on clears for now until her endoscopy.  However, timing of endoscopy will be deferred until her sodium improves given severe hyponatremia in the setting of alcoholism. Will need to keep trending H/H daily for now.  Will need to keep strict CIWA protocol.  She is not presenting any signs of encephalopathy or significant third spacing.  Patient has presented persistent symptoms of nausea and vomiting of unclear etiology.  Also presenting chronic diarrhea, for which she was undergoing evaluation as outpatient but unfortunately did not schedule her EGD and colonoscopy.  As part of the evaluation of  her diarrhea, may check C. difficile and GI pathogen panel to rule out infectious causes causing her chronic diarrhea episodes, as well as celiac disease panel.  - Repeat CBC qday, transfuse if Hb <7 - Pantoprazole  40 mg q12h IVP -Octreotide drip -Ceftriaxone 1 g daily for SBP prophylaxis -Check C. difficile and GI pathogen panel -Check celiac disease panel - 2 large bore IV lines. -Zofran  as needed for nausea and vomiting -CIWA protocol - Active T/S - Clear liquid diet -Alcohol avoidance - Avoid NSAIDs - Will proceed with EGD once sodium approaches the high 120s range  Samantha Cress, MD Gastroenterology and Hepatology [facility] Gastroenterology

## 2023-05-13 DIAGNOSIS — R112 Nausea with vomiting, unspecified: Secondary | ICD-10-CM | POA: Diagnosis not present

## 2023-05-13 DIAGNOSIS — K7030 Alcoholic cirrhosis of liver without ascites: Secondary | ICD-10-CM

## 2023-05-13 DIAGNOSIS — E871 Hypo-osmolality and hyponatremia: Secondary | ICD-10-CM | POA: Diagnosis not present

## 2023-05-13 DIAGNOSIS — K529 Noninfective gastroenteritis and colitis, unspecified: Secondary | ICD-10-CM | POA: Diagnosis not present

## 2023-05-13 DIAGNOSIS — K219 Gastro-esophageal reflux disease without esophagitis: Secondary | ICD-10-CM

## 2023-05-13 DIAGNOSIS — K922 Gastrointestinal hemorrhage, unspecified: Secondary | ICD-10-CM

## 2023-05-13 DIAGNOSIS — F1010 Alcohol abuse, uncomplicated: Secondary | ICD-10-CM | POA: Diagnosis not present

## 2023-05-13 DIAGNOSIS — E876 Hypokalemia: Secondary | ICD-10-CM

## 2023-05-13 DIAGNOSIS — F1090 Alcohol use, unspecified, uncomplicated: Secondary | ICD-10-CM

## 2023-05-13 LAB — MAGNESIUM: Magnesium: 1.7 mg/dL (ref 1.7–2.4)

## 2023-05-13 LAB — OSMOLALITY, URINE: Osmolality, Ur: 351 mosm/kg (ref 300–900)

## 2023-05-13 LAB — COMPREHENSIVE METABOLIC PANEL WITH GFR
ALT: 40 U/L (ref 0–44)
AST: 121 U/L — ABNORMAL HIGH (ref 15–41)
Albumin: 2.6 g/dL — ABNORMAL LOW (ref 3.5–5.0)
Alkaline Phosphatase: 58 U/L (ref 38–126)
Anion gap: 7 (ref 5–15)
BUN: 7 mg/dL (ref 6–20)
CO2: 21 mmol/L — ABNORMAL LOW (ref 22–32)
Calcium: 7.7 mg/dL — ABNORMAL LOW (ref 8.9–10.3)
Chloride: 95 mmol/L — ABNORMAL LOW (ref 98–111)
Creatinine, Ser: 0.39 mg/dL — ABNORMAL LOW (ref 0.44–1.00)
GFR, Estimated: >60 mL/min (ref >60)
Glucose, Bld: 104 mg/dL — ABNORMAL HIGH (ref 70–99)
Potassium: 4.4 mmol/L (ref 3.5–5.1)
Sodium: 123 mmol/L — ABNORMAL LOW (ref 135–145)
Total Bilirubin: 2 mg/dL — ABNORMAL HIGH (ref 0.0–1.2)
Total Protein: 6.1 g/dL — ABNORMAL LOW (ref 6.5–8.1)

## 2023-05-13 LAB — CBC
HCT: 24.1 % — ABNORMAL LOW (ref 36.0–46.0)
Hemoglobin: 8.3 g/dL — ABNORMAL LOW (ref 12.0–15.0)
MCH: 35.6 pg — ABNORMAL HIGH (ref 26.0–34.0)
MCHC: 34.4 g/dL (ref 30.0–36.0)
MCV: 103.4 fL — ABNORMAL HIGH (ref 80.0–100.0)
Platelets: DECREASED 10*3/uL (ref 150–400)
RBC: 2.33 MIL/uL — ABNORMAL LOW (ref 3.87–5.11)
RDW: 13.2 % (ref 11.5–15.5)
WBC: 3.3 10*3/uL — ABNORMAL LOW (ref 4.0–10.5)
nRBC: 0 % (ref 0.0–0.2)

## 2023-05-13 LAB — PROTIME-INR
INR: 1.3 — ABNORMAL HIGH (ref 0.8–1.2)
Prothrombin Time: 16.2 s — ABNORMAL HIGH (ref 11.4–15.2)

## 2023-05-13 LAB — SODIUM: Sodium: 125 mmol/L — ABNORMAL LOW (ref 135–145)

## 2023-05-13 LAB — SODIUM, URINE, RANDOM: Sodium, Ur: 74 mmol/L

## 2023-05-13 NOTE — TOC Initial Note (Signed)
 Transition of Care [facility]) - Initial/Assessment Note    Patient Details  Name: Joan Wood MRN: [id number] Date of Birth: 15-Dec-1970  [facility] CM/SW Contact:    Lynda Sands, RN Phone Number: 05/13/2023, 5:43 PM  Clinical Narrative:  CM met with patient at bedside. Admitted with Acute hyponatremia . Patient live with a roommate. Patient reports she cannot drive. Patient room mate provide transportation when he is in town.  Patiet stated she uses medical transport if her roommate is not available. Patient reports she has history of falls.  Patient owns DME: cane, crutches, and wheelchair, Patient would like to get a walker. Patient reports a walker will allow her mor mobility .          Expected Discharge Plan: Home w Home Health Services Barriers to Discharge: Continued Medical Work up   Patient Goals and CMS Choice Patient states their goals for this hospitalization and ongoing recovery are:: Discharge Home          Expected Discharge Plan and Services  Home with Home Health    Living arrangements for the past 2 months: Single Family Home                   Do you feel safe going back to the place where you live?: Yes      Need for Family Participation in Patient Care: Yes (Comment) Care giver support system in place?: Yes (comment)   Criminal Activity/Legal Involvement Pertinent to Current Situation/Hospitalization: No - Comment as needed  Activities of Daily Living   ADL Screening (condition at time of admission) Independently performs ADLs?: Yes (appropriate for developmental age) Is the patient deaf or have difficulty hearing?: No Does the patient have difficulty seeing, even when wearing glasses/contacts?: No Does the patient have difficulty concentrating, remembering, or making decisions?: No  Permission Sought/Granted Permission sought to share information with : Case Manager Permission granted to share information with : Yes, Verbal Permission  Granted         Emotional Assessment Appearance:: Appears stated age Attitude/Demeanor/Rapport: Engaged Affect (typically observed): Appropriate Orientation: : Oriented to Place, Oriented to Self, Oriented to  Time, Oriented to Situation   Psych Involvement: No (comment)  Admission diagnosis:  Acute hyponatremia [E87.1] Patient Active Problem List   Diagnosis Date Noted   Acute hyponatremia 05/11/2023   Pancreatitis 05/11/2023   Gastritis 05/11/2023   Hypokalemia 05/11/2023   Multiple closed fractures of metatarsal bone with routine healing, right 04/24/2023   Neuropathy 03/31/2023   Irritable bowel syndrome with diarrhea 03/31/2023   GERD (gastroesophageal reflux disease) 03/31/2023   Nausea and vomiting 03/31/2023   Alcohol use disorder 03/28/2022   Left hip pain 03/28/2022   Periumbilical hernia 03/28/2022   Adjustment disorder with mixed anxiety and depressed mood 12/15/2014   Malingering 12/14/2014   PCP:  Zarwolo, Gloria, FNP Pharmacy:   [facility] [phone] - [place], [place] - [address] AT [facility] [address] [place] [address] Phone: [phone] Fax: [phone]     Social Drivers of Health (SDOH) Social History: SDOH Screenings   Food Insecurity: No Food Insecurity (05/12/2023)  Housing: Low Risk  (05/12/2023)  Transportation Needs: No Transportation Needs (05/12/2023)  Utilities: Not At Risk (05/12/2023)  Depression (PHQ2-9): High Risk (03/29/2023)  Tobacco Use: High Risk (05/12/2023)   SDOH Interventions:     Readmission Risk Interventions     No data to display

## 2023-05-13 NOTE — Progress Notes (Signed)
   05/13/23 1734  TOC Assessment  TOC screening is complete Yes  Once discharged, how will the patient get to their discharge location? Family/Friend - Photographer  Expected Discharge Plan Home w Home Health Services  Barriers to Discharge Continued Medical Work up  Patient states their goals for this hospitalization and ongoing recovery are: Discharge Home  Living arrangements for the past 2 months Single Family Home  Lives with: Roommate  Do you feel safe going back to the place where you live? Y  Permission sought to share information with  Case Manager  Permission granted to share information with  Yes, Verbal Permission Granted  Patient language and need for interpreter reviewed: No  Criminal Activity/Legal Involvement Pertinent to Current Situation/Hospitalization No - Comment as needed  Need for Family Participation in Patient Care Y  Care giver support system in place? Y  Appearance: Appears stated age  Attitude/Demeanor/Rapport Engaged  Affect (typically observed) Appropriate  Orientation:  Oriented to Place;Oriented to Self;Oriented to  Time;Oriented to Situation  Psych Involvement N   Admitted with Acute hyponatremia. TOC will continue to monitor patient advancement through interdisciplinary progression rounds.

## 2023-05-13 NOTE — Progress Notes (Signed)
 Joan Wood, M.D. Gastroenterology & Hepatology   Interval History:   Patient reports she had a couple of episodes of nausea and vomiting yesterday.  Still having some epigastric pain.  No bowel movement so far as she reports "she has not eaten anything solid yet". Labs today showed sodium of 125, AST 121, ALT 40, CBC with hemoglobin 8.3, WBC 3.3 and INR 1.3.  Inpatient Medications:  Current Facility-Administered Medications:    0.9 %  sodium chloride  infusion, , Intravenous, Continuous, Mason Sole, Pratik D, DO, Last Rate: 75 mL/hr at 05/13/23 0756, Infusion Verify at 05/13/23 0756   acetaminophen  (TYLENOL ) tablet 650 mg, 650 mg, Oral, Q6H PRN **OR** acetaminophen  (TYLENOL ) suppository 650 mg, 650 mg, Rectal, Q6H PRN, Mason Sole, Pratik D, DO   Chlorhexidine Gluconate Cloth 2 % PADS 6 each, 6 each, Topical, Q0600, Mason Sole, Pratik D, DO, 6 each at 05/13/23 1610   folic acid  (FOLVITE ) tablet 1 mg, 1 mg, Oral, Daily, Mason Sole, Pratik D, DO, 1 mg at 05/13/23 9604   HYDROmorphone (DILAUDID) injection 0.5-1 mg, 0.5-1 mg, Intravenous, Q2H PRN, Mason Sole, Pratik D, DO, 0.5 mg at 05/13/23 0813   LORazepam (ATIVAN) tablet 1-4 mg, 1-4 mg, Oral, Q1H PRN, 1 mg at 05/13/23 0810 **OR** LORazepam (ATIVAN) injection 1-4 mg, 1-4 mg, Intravenous, Q1H PRN, Mason Sole, Pratik D, DO   multivitamin with minerals tablet 1 tablet, 1 tablet, Oral, Daily, Mason Sole, Pratik D, DO, 1 tablet at 05/13/23 0810   [COMPLETED] octreotide (SANDOSTATIN) 2 mcg/mL load via infusion 25 mcg, 25 mcg, Intravenous, Once, 25 mcg at 05/12/23 1456 **AND** octreotide (SANDOSTATIN) 500 mcg in sodium chloride  0.9 % 250 mL (2 mcg/mL) infusion, 50 mcg/hr, Intravenous, Continuous, Shah, Pratik D, DO, Last Rate: 25 mL/hr at 05/12/23 1732, 50 mcg/hr at 05/12/23 1732   ondansetron  (ZOFRAN ) tablet 4 mg, 4 mg, Oral, Q6H PRN **OR** ondansetron  (ZOFRAN ) injection 4 mg, 4 mg, Intravenous, Q6H PRN, Mason Sole, Pratik D, DO   thiamine  (VITAMIN B1) tablet 100 mg, 100 mg, Oral, Daily, 100 mg at  05/13/23 0810 **OR** thiamine  (VITAMIN B1) injection 100 mg, 100 mg, Intravenous, Daily, Mason Sole, Pratik D, DO   I/O    Intake/Output Summary (Last 24 hours) at 05/13/2023 0902 Last data filed at 05/13/2023 0756 Gross per 24 hour  Intake 3039.63 ml  Output --  Net 3039.63 ml     Physical Exam: Temp:  [97.7 F (36.5 C)-99.1 F (37.3 C)] 98.6 F (37 C) (05/04 0740) Pulse Rate:  [89-107] 90 (05/04 0830) Resp:  [10-29] 10 (05/04 0830) BP: (93-138)/(51-84) 138/84 (05/04 0830) SpO2:  [91 %-100 %] 92 % (05/04 0830) Weight:  [56.2 kg-56.7 kg] 56.2 kg (05/03 1415)  Temp (24hrs), Avg:98.3 F (36.8 C), Min:97.7 F (36.5 C), Max:99.1 F (37.3 C)  GENERAL: The patient is AO x3, in no acute distress. HEENT: Head is normocephalic and atraumatic. EOMI are intact. Mouth is well hydrated and without lesions. NECK: Supple. No masses LUNGS: Clear to auscultation. No presence of rhonchi/wheezing/rales. Adequate chest expansion HEART: RRR, normal s1 and s2. ABDOMEN: tender to patient in the epigastric area, no guarding, no peritoneal signs, and nondistended. BS +. No masses. EXTREMITIES: Without any cyanosis, clubbing, rash, lesions or edema. NEUROLOGIC: AOx3, no focal motor deficit. Has tremor but no asterixis SKIN: no jaundice, no rashes  Laboratory Data: CBC:     Component Value Date/Time   WBC 3.3 (L) 05/13/2023 0222   RBC 2.33 (L) 05/13/2023 0222   HGB 8.3 (L) 05/13/2023 0222   HGB 13.2 04/11/2023 1455  HCT 24.1 (L) 05/13/2023 0222   HCT 36.1 04/11/2023 1455   PLT PLATELETS APPEAR DECREASED 05/13/2023 0222   PLT 107 (L) 04/11/2023 1455   MCV 103.4 (H) 05/13/2023 0222   MCV 97 04/11/2023 1455   MCH 35.6 (H) 05/13/2023 0222   MCHC 34.4 05/13/2023 0222   RDW 13.2 05/13/2023 0222   RDW 11.7 04/11/2023 1455   LYMPHSABS 0.9 05/12/2023 0940   LYMPHSABS 1.7 04/03/2023 1041   MONOABS 0.3 05/12/2023 0940   EOSABS 0.0 05/12/2023 0940   EOSABS 0.0 04/03/2023 1041   BASOSABS 0.0 05/12/2023  0940   BASOSABS 0.0 04/03/2023 1041   COAG:  Lab Results  Component Value Date   INR 1.3 (H) 05/13/2023   INR 1.2 05/12/2023   INR 1.1 04/11/2023    BMP:     Latest Ref Rng & Units 05/13/2023    7:54 AM 05/13/2023    2:22 AM 05/12/2023    7:56 PM  BMP  Glucose 70 - 99 mg/dL  604    BUN 6 - 20 mg/dL  7    Creatinine 5.40 - 1.00 mg/dL  9.81    Sodium 191 - 478 mmol/L 125  123  124   Potassium 3.5 - 5.1 mmol/L  4.4    Chloride 98 - 111 mmol/L  95    CO2 22 - 32 mmol/L  21    Calcium 8.9 - 10.3 mg/dL  7.7      HEPATIC:     Latest Ref Rng & Units 05/13/2023    2:22 AM 05/12/2023    9:40 AM 05/11/2023    3:12 PM  Hepatic Function  Total Protein 6.5 - 8.1 g/dL 6.1  7.4  8.0   Albumin 3.5 - 5.0 g/dL 2.6  3.3  3.5   AST 15 - 41 U/L 121  120  99   ALT 0 - 44 U/L 40  40  38   Alk Phosphatase 38 - 126 U/L 58  72  74   Total Bilirubin 0.0 - 1.2 mg/dL 2.0  1.9  2.2     CARDIAC: No results found for: "CKTOTAL", "CKMB", "CKMBINDEX", "TROPONINI"    Imaging: I personally reviewed and interpreted the available labs, imaging and endoscopic files.   Assessment/Plan: Joan Wood is a 53 y.o. female with a history of alcohol abuse, GERD, HTN, SVT, periumbilical hernia, possible alcoholic cirrhosis, IBS with diarrhea, who was admitted to the hospital after presenting worsening abdominal pain, nausea, vomiting and melena.  Gastroenterology was consulted for evaluation of possible upper gastrointestinal bleeding.   Patient was found to have features on imaging concerning for cirrhosis.  It is likely she has presented these change due to chronic alcohol abuse, for which I emphasized the importance of avoiding alcohol intake.  She is presenting with symptoms of upper gastrointestinal bleeding.  I doubt that these symptoms are primarily related to variceal bleeding as she is not HD unstable,  but we continue covering possible variceal bleeding with ceftriaxone SBP prophylaxis and octreotide drip.  She will  need to be on PPI twice daily IV.  She can stay on clears for now until her endoscopy.  However, timing of endoscopy will be deferred until her sodium improves given severe hyponatremia in the setting of alcoholism. Will need to keep trending H/H daily for now.    She is not presenting any signs of encephalopathy or significant third spacing.She is at high risk of withdrawal, for which she will need to continue strict  CIWA protocol.   Patient has presented persistent symptoms of nausea and vomiting of unclear etiology.  Also presenting chronic diarrhea, for which she was undergoing evaluation as outpatient but unfortunately did not schedule her EGD and colonoscopy.  As part of the evaluation of her diarrhea, we ordered C. difficile and GI pathogen panel to rule out infectious causes but she has not had a bowel movement yet, as well as celiac disease panel.   - Repeat CBC qday, transfuse if Hb <7 - Pantoprazole  40 mg q12h IVP -Octreotide drip -Ceftriaxone 1 g daily for SBP prophylaxis -Check C. difficile and GI pathogen panel -Follow up celiac disease panel - 2 large bore IV lines. -Zofran  as needed for nausea and vomiting -CIWA protocol - Active T/S - Clear liquid diet -Alcohol avoidance - Avoid NSAIDs - Will proceed with EGD once sodium approaches 129-130 , will keep NPO after MN for possible procedure tomorrow  Joan Cress, MD Gastroenterology and Hepatology [facility] Gastroenterology

## 2023-05-13 NOTE — Progress Notes (Signed)
 Progress Note   Patient: Joan Wood DOB: 08/10/1970 DOA: 05/12/2023     1 DOS: the patient was seen and examined on 05/13/2023   Brief hospital admission narrative: Per H&P written by Dr. Mason Sole on 05/12/2023 Joan Wood is a 53 y.o. female with medical history significant for anxiety, GERD, abdominal wall hernia, prior SVT, and hypertension who presented to the ED today with abdominal pain along with nausea and vomiting over the last along with nausea and vomiting over the last 3-4 days.  She has also had severe nonbloody diarrhea and has had sick contact with a roommate who had similar symptoms, but she states that this was related to diabetic episode.  She was seen in the ED on 5/2, but then decided to leave AMA and presented back today.  At that time she was noted to have hyponatremia which now appears to be worsening and hemoglobin levels are dropping.  She has noted to have dark, tarry stools at this point as well.  She drinks alcohol on a consistent basis, but appears to have cut back recently and drinks about 2 beers per day.  She does smoke 8-10 cigarettes daily.  She did fall recently and appears to have some old compression fractures in her thoracic spine, but no new injuries.   ED Course: Vital signs with some tachycardia noted and sodium 118, potassium 2.8 and platelet count of 91,000.  CT studies with no acute findings noted, but there are age-indeterminate vertebral compression fractures throughout the thoracic spine that appear to be related to osteopenia.  Alcohol level 132.  She was given a bolus of normal saline as well as some potassium supplementation and GI was called by EDP with recommendations to maintain on PPI as well as clear liquid diet and correct sodium levels further prior to endoscopy.  Assessment and plan 1-acute on chronic hyponatremia - In the setting of alcohol abuse and dehydration - Alcohol cessation counseling provided - Continue fluid  resuscitation - Follow electrolytes trend - TSH within normal limits. - Continue to follow hemoglobin trend and transfuse as needed.  2-acute blood loss anemia secondary to upper GI bleed - Appreciate assistance and recommendation by GI service - Continue altered and IV PPI - Once electrolytes further stabilized plan is for endoscopy evaluation. - Okay to continue clear liquids diet for now.  3-nausea/vomiting and diarrhea with suspected gastroenteritis - No further diarrhea since admission - GI pathogen panel was ordered - Continue fluid resuscitation and as needed antiemetics. - Lipase mildly elevated; patient reports no abdominal pain.  4-transaminitis - In the setting of alcohol abuse - Continue fluid resuscitation and follow LFTs trend - Will follow GI service further recommendations and desire inpatient workup. - Images suggesting early cirrhotic changes.  5-GERD - Continue PPI.  6-alcohol abuse - Continue thiamine /folic acid  - Continue CIWA protocol - No acute withdrawal symptoms currently appreciated.  7-thrombocytopenia - In the setting of alcohol abuse - SCDs for DVT prophylaxis - Continue to follow platelet count trend - Currently no overt bleeding appreciated.  Subjective:  No acute withdrawal symptoms appreciated; expressed no further diarrhea.  Tolerating clear liquid diet.  Good saturation on room air.  Hemodynamically stable.  Physical Exam: Vitals:   05/13/23 0830 05/13/23 0930 05/13/23 1000 05/13/23 1030  BP: 138/84 (!) 148/92 (!) 154/93 (!) 153/96  Pulse: 90 93 94 96  Resp: 10 10 10 10   Temp:      TempSrc:      SpO2: 92%  97% 95% 95%  Weight:      Height:       General exam: Alert, awake, oriented x 3; no overt bleeding. Respiratory system: Good saturation on room air. Cardiovascular system:RRR. No rubs or gallops. Gastrointestinal system: Abdomen is nondistended, soft and nontender. No organomegaly or masses felt. Normal bowel sounds  heard. Central nervous system: No focal neurological deficits. Extremities: No C/C/E, +pedal pulses Skin: No petechiae. Psychiatry: Judgement and insight appear normal. Mood & affect appropriate.    Data Reviewed: CBC: WBC 3.3, hemoglobin 8.3, MCV 103.4 and platelet count 91K. Comprehensive metabolic panel: Sodium 125, potassium 4.4, chloride 95, bicarb 21, BUN 7, creatinine 0.39, AST 121, ALT 40, total bilirubin 2.0 and GFR >60 Magnesium : 1.7  Family Communication: No family at bedside.  Disposition: Status is: Inpatient Remains inpatient appropriate because: Continue IV therapy.  Anticipating discharge home once medically stable.  Time spent: 50 minutes  Author: Justina Oman, MD 05/13/2023 11:00 AM  For on call review [email or web address].

## 2023-05-14 ENCOUNTER — Ambulatory Visit: Payer: Self-pay

## 2023-05-14 DIAGNOSIS — E871 Hypo-osmolality and hyponatremia: Secondary | ICD-10-CM | POA: Diagnosis not present

## 2023-05-14 LAB — COMPREHENSIVE METABOLIC PANEL WITH GFR
ALT: 44 U/L (ref 0–44)
AST: 123 U/L — ABNORMAL HIGH (ref 15–41)
Albumin: 2.8 g/dL — ABNORMAL LOW (ref 3.5–5.0)
Alkaline Phosphatase: 64 U/L (ref 38–126)
Anion gap: 7 (ref 5–15)
BUN: <5 mg/dL — ABNORMAL LOW (ref 6–20)
CO2: 24 mmol/L (ref 22–32)
Calcium: 8.3 mg/dL — ABNORMAL LOW (ref 8.9–10.3)
Chloride: 87 mmol/L — ABNORMAL LOW (ref 98–111)
Creatinine, Ser: 0.35 mg/dL — ABNORMAL LOW (ref 0.44–1.00)
GFR, Estimated: >60 mL/min (ref >60)
Glucose, Bld: 102 mg/dL — ABNORMAL HIGH (ref 70–99)
Potassium: 3.9 mmol/L (ref 3.5–5.1)
Sodium: 118 mmol/L — CL (ref 135–145)
Total Bilirubin: 1.7 mg/dL — ABNORMAL HIGH (ref 0.0–1.2)
Total Protein: 6.5 g/dL (ref 6.5–8.1)

## 2023-05-14 LAB — CBC
HCT: 25.4 % — ABNORMAL LOW (ref 36.0–46.0)
Hemoglobin: 8.5 g/dL — ABNORMAL LOW (ref 12.0–15.0)
MCH: 34.1 pg — ABNORMAL HIGH (ref 26.0–34.0)
MCHC: 33.5 g/dL (ref 30.0–36.0)
MCV: 102 fL — ABNORMAL HIGH (ref 80.0–100.0)
Platelets: 91 10*3/uL — ABNORMAL LOW (ref 150–400)
RBC: 2.49 MIL/uL — ABNORMAL LOW (ref 3.87–5.11)
RDW: 12.6 % (ref 11.5–15.5)
WBC: 3.9 10*3/uL — ABNORMAL LOW (ref 4.0–10.5)
nRBC: 0 % (ref 0.0–0.2)

## 2023-05-14 LAB — SODIUM: Sodium: 122 mmol/L — ABNORMAL LOW (ref 135–145)

## 2023-05-14 LAB — PROTIME-INR
INR: 1.2 (ref 0.8–1.2)
Prothrombin Time: 15.8 s — ABNORMAL HIGH (ref 11.4–15.2)

## 2023-05-14 NOTE — Progress Notes (Signed)
 No new orders received for critical sodium results

## 2023-05-14 NOTE — Discharge Summary (Signed)
 Physician Discharge Summary  Joan Wood:[id number] DOB: May 22, 1970 DOA: 05/12/2023  PCP: Zarwolo, Gloria, FNP  Admit date: 05/12/2023  Discharge date: 05/14/2023 AMA discharge  Admitted From:Home  Disposition:  AMA DISCHARGE   CODE STATUS: Full  Brief/Interim Summary:  Joan Wood is a 53 y.o. female with medical history significant for anxiety, GERD, abdominal wall hernia, prior SVT, and hypertension who presented to the ED today with abdominal pain along with nausea and vomiting over the last along with nausea and vomiting over the last 3-4 days.  She was admitted for GI evaluation due to acute blood loss anemia and suspected upper GI bleed, but was also noted to have severe hyponatremia likely in the setting of alcohol use for which she merited further evaluation.  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  Unfortunately, she is wanting to leave AMA today to take care of some things at home and is otherwise with full decision-making capacity and is alert and oriented x 4.  She understands the risks of doing this especially in light of her sodium levels and states that she will be back to the hospital in the next 1-2 days.  No other acute events or concerns noted throughout the course of this admission.  Discharge Diagnoses:  Principal Problem:   Acute hyponatremia Active Problems:   Alcohol use disorder   Periumbilical hernia   Irritable bowel syndrome with diarrhea   GERD (gastroesophageal reflux disease)   Nausea and vomiting   Hypokalemia    Allergies  Allergen Reactions   Bee Venom Anaphylaxis    Cannot take epi for this allergy d/t heart condition per pt   Latex Rash    Consultations: GI   Procedures/Studies: CT Chest Wo Contrast Result Date: 05/12/2023 CLINICAL DATA:  53 year old female status post fall backwards yesterday. Cirrhosis.  Syncope. Pain. EXAM: CT CHEST WITHOUT CONTRAST TECHNIQUE: Multidetector CT imaging of the chest was performed following the standard protocol without IV contrast. RADIATION DOSE REDUCTION: This exam was performed according to the departmental dose-optimization program which includes automated exposure control, adjustment of the mA and/or kV according to patient size and/or use of iterative reconstruction technique. COMPARISON:  Cervical spine CT today. CT Abdomen and Pelvis yesterday. FINDINGS: Cardiovascular: Calcified aortic atherosclerosis. Calcified coronary artery atherosclerosis on series 2, image 80. No cardiomegaly. No pericardial effusion. Vascular patency is not evaluated in the absence of IV contrast. Mediastinum/Nodes: Negative for mediastinal mass or lymphadenopathy. Lungs/Pleura: Major airways are patent. Normal lung volumes. Both lungs are clear. No pleural effusion. Upper Abdomen: Stable compared to the CT Abdomen and Pelvis yesterday. Musculoskeletal: Mild superior thoracic endplate compression fractures at T1, T3, T9 (moderate), T12. The latter 2 levels appear to be chronic. Underlying osteopenia. No retropulsion or complicating features. No rib fracture identified. Sternum and visible shoulder osseous structures appear intact. IMPRESSION: 1. Osteopenia with multiple generally mild thoracic compression fractures, T1, T3, T9, and T12. T1 and T3 appear age indeterminate. 2. No other acute traumatic injury identified in the noncontrast Chest. 3. Calcified coronary artery and Aortic Atherosclerosis (ICD10-I70.0). 4. Upper abdomen stable from CT Abdomen and Pelvis yesterday. Electronically Signed   By: Marlise Simpers M.D.   On: 05/12/2023 10:23   CT Cervical Spine Wo Contrast Result Date: 05/12/2023 CLINICAL DATA:  53 year old female status post fall backwards yesterday. Cirrhosis. Syncope. Pain. EXAM: CT CERVICAL SPINE WITHOUT CONTRAST TECHNIQUE: Multidetector CT  imaging of the cervical spine was performed  without intravenous contrast. Multiplanar CT image reconstructions were also generated. RADIATION DOSE REDUCTION: This exam was performed according to the departmental dose-optimization program which includes automated exposure control, adjustment of the mA and/or kV according to patient size and/or use of iterative reconstruction technique. COMPARISON:  Head CT today.  Chest CT reported separately. FINDINGS: Alignment: Straightening of cervical lordosis. Cervicothoracic junction alignment is within normal limits. Mild degenerative appearing retrolisthesis of C5 on C6 with associated disc space loss. Bilateral posterior element alignment is within normal limits. Skull base and vertebrae: Osteopenia. Visualized skull base is intact. No atlanto-occipital dissociation. C1 and C2 appear intact and aligned. No acute osseous abnormality identified. Soft tissues and spinal canal: No prevertebral fluid or swelling. No visible canal hematoma. Negative visible noncontrast neck soft tissues. Disc levels: Mild for age cervical spine degeneration except at C5-C6 where disc space loss and asymmetric endplate spurring is associated with mild retrolisthesis. Probably no significant cervical spinal stenosis by CT. Upper chest: Subtle T1 superior endplate compression is age indeterminate. No retropulsion or complicating features. Lung apices are clear. IMPRESSION: 1. No acute traumatic injury identified in the cervical spine. 2. Osteopenia.  Chronic degeneration at C5-C6. 3. Mild T1 superior endplate compression fracture is age indeterminate. Electronically Signed   By: Marlise Simpers M.D.   On: 05/12/2023 10:19   CT Head Wo Contrast Result Date: 05/12/2023 CLINICAL DATA:  53 year old female status post fall backwards yesterday. Cirrhosis. Syncope. Pain. EXAM: CT HEAD WITHOUT CONTRAST TECHNIQUE: Contiguous axial images were obtained from the base of the skull through the vertex without intravenous contrast. RADIATION DOSE REDUCTION: This  exam was performed according to the departmental dose-optimization program which includes automated exposure control, adjustment of the mA and/or kV according to patient size and/or use of iterative reconstruction technique. COMPARISON:  None Available. FINDINGS: Brain: Cerebral volume probably at the lower limits of normal for age. No midline shift, ventriculomegaly, mass effect, evidence of mass lesion, intracranial hemorrhage or evidence of cortically based acute infarction. Patchy, symmetric moderate bilateral cerebral white matter hypodensity which is mostly periventricular. Vascular: No suspicious intracranial vascular hyperdensity. Calcified atherosclerosis at the skull base. Skull: Osteopenia. Intact. No acute osseous abnormality identified. Sinuses/Orbits: Visualized paranasal sinuses and mastoids are clear. Other: No acute orbit or scalp soft tissue injury identified. IMPRESSION: 1. No acute traumatic injury identified. 2. Moderate cerebral white matter changes, most commonly due to small vessel disease. Electronically Signed   By: Marlise Simpers M.D.   On: 05/12/2023 10:17   CT ABDOMEN PELVIS W CONTRAST Result Date: 05/11/2023 CLINICAL DATA:  Acute generalized abdominal pain, nausea, vomiting. EXAM: CT ABDOMEN AND PELVIS WITH CONTRAST TECHNIQUE: Multidetector CT imaging of the abdomen and pelvis was performed using the standard protocol following bolus administration of intravenous contrast. RADIATION DOSE REDUCTION: This exam was performed according to the departmental dose-optimization program which includes automated exposure control, adjustment of the mA and/or kV according to patient size and/or use of iterative reconstruction technique. CONTRAST:  OMNIPAQUE  IOHEXOL  300 MG/ML  SOLN COMPARISON:  April 14, 2023.  August 14, 2020. FINDINGS: Lower chest: No acute abnormality. Hepatobiliary: Nodular hepatic contours are noted consistent with hepatic cirrhosis. No cholelithiasis or biliary dilatation is  noted. Patent periumbilical vein is noted consistent with portal hypertension. Stable probable hemangioma seen inferiorly in right hepatic lobe. Pancreas: Unremarkable. No pancreatic ductal dilatation or surrounding inflammatory changes. Spleen: Normal in size without focal abnormality. Adrenals/Urinary Tract: Adrenal glands are unremarkable. Kidneys are  normal, without renal calculi, focal lesion, or hydronephrosis. Bladder is unremarkable. Stomach/Bowel: Wall thickening of gastric antrum is noted suggesting possible inflammation or peptic ulcer disease. There is no evidence of bowel obstruction. The appendix is unremarkable. Vascular/Lymphatic: Aortic atherosclerosis. No enlarged abdominal or pelvic lymph nodes. Reproductive: Uterus and bilateral adnexa are unremarkable. Other: No ascites is noted. Musculoskeletal: No acute or significant osseous findings. IMPRESSION: Hepatic cirrhosis is noted with recanalized periumbilical vein consistent with portal hypertension. Wall thickening of gastric antrum is noted suggesting possible inflammation or peptic ulcer disease. Aortic Atherosclerosis (ICD10-I70.0). Electronically Signed   By: Rosalene Colon M.D.   On: 05/11/2023 17:07   DG Ankle Complete Right Result Date: 04/15/2023 CLINICAL DATA:  Trauma to the right foot.  Pain. EXAM: RIGHT ANKLE - COMPLETE 3+ VIEW; RIGHT FOOT - 2 VIEW COMPARISON:  None Available. FINDINGS: Nondisplaced fractures of the bases of the 2nd-4th metatarsals. No dislocation. No widening of the Lisfranc joint. The bones are osteopenic. The ankle mortise is intact. The soft tissues are unremarkable. IMPRESSION: Nondisplaced fractures of the bases of the 2nd-4th metatarsals. Electronically Signed   By: Angus Bark M.D.   On: 04/15/2023 19:03   DG Foot 2 Views Right Result Date: 04/15/2023 CLINICAL DATA:  Trauma to the right foot.  Pain. EXAM: RIGHT ANKLE - COMPLETE 3+ VIEW; RIGHT FOOT - 2 VIEW COMPARISON:  None Available. FINDINGS:  Nondisplaced fractures of the bases of the 2nd-4th metatarsals. No dislocation. No widening of the Lisfranc joint. The bones are osteopenic. The ankle mortise is intact. The soft tissues are unremarkable. IMPRESSION: Nondisplaced fractures of the bases of the 2nd-4th metatarsals. Electronically Signed   By: Angus Bark M.D.   On: 04/15/2023 19:03     Discharge Exam: Vitals:   05/14/23 0318 05/14/23 0918  BP: 121/86 125/87  Pulse: 88 100  Resp: 16 18  Temp: 98.8 F (37.1 C) 98.2 F (36.8 C)  SpO2: 97% 97%   Vitals:   05/13/23 1930 05/13/23 2307 05/14/23 0318 05/14/23 0918  BP: (!) 150/95 (!) 132/96 121/86 125/87  Pulse: 93 95 88 100  Resp: 16 18 16 18   Temp: 98.7 F (37.1 C) 98.1 F (36.7 C) 98.8 F (37.1 C) 98.2 F (36.8 C)  TempSrc: Oral Oral  Oral  SpO2: 100% 98% 97% 97%  Weight:      Height:        General: Pt is alert, awake, not in acute distress Cardiovascular: RRR, S1/S2 +, no rubs, no gallops Respiratory: CTA bilaterally, no wheezing, no rhonchi Abdominal: Soft, NT, ND, bowel sounds + Extremities: no edema, no cyanosis    The results of significant diagnostics from this hospitalization (including imaging, microbiology, ancillary and laboratory) are listed below for reference.     Microbiology: Recent Results (from the past 240 hours)  Blood culture (routine x 2)     Status: None (Preliminary result)   Collection Time: 05/11/23  3:09 PM   Specimen: BLOOD  Result Value Ref Range Status   Specimen Description BLOOD LEFT ANTECUBITAL  Final   Special Requests   Final    BOTTLES DRAWN AEROBIC AND ANAEROBIC Blood Culture adequate volume   Culture   Final    NO GROWTH 3 DAYS Performed at [facility], [address] [place], [name] [address]    Report Status PENDING  Incomplete  Blood culture (routine x 2)     Status: None (Preliminary result)   Collection Time: 05/11/23  3:14 PM   Specimen: BLOOD  Result Value Ref Range Status   Specimen Description  BLOOD RIGHT ANTECUBITAL  Final   Special Requests   Final    BOTTLES DRAWN AEROBIC AND ANAEROBIC Blood Culture adequate volume   Culture   Final    NO GROWTH 3 DAYS Performed at [facility], [address], [place], [name] [address]    Report Status PENDING  Incomplete  MRSA Next Gen by PCR, Nasal     Status: None   Collection Time: 05/12/23  2:14 PM   Specimen: Nasal Mucosa; Nasal Swab  Result Value Ref Range Status   MRSA by PCR Next Gen NOT DETECTED NOT DETECTED Final    Comment: (NOTE) The GeneXpert MRSA Assay (FDA approved for NASAL specimens only), is one component of a comprehensive MRSA colonization surveillance program. It is not intended to diagnose MRSA infection nor to guide or monitor treatment for MRSA infections. Test performance is not FDA approved in patients less than 39 years old. Performed at [facility], [address], [place], [name] [address]      Labs: BNP (last 3 results) No results for input(s): "BNP" in the last 8760 hours. Basic Metabolic Panel: Recent Labs  Lab 05/11/23 1512 05/12/23 0940 05/12/23 1421 05/12/23 1956 05/13/23 0222 05/13/23 0754 05/14/23 0454 05/14/23 0856  NA 123* 118*   < > 124* 123* 125* 118* 122*  K 2.9* 2.8*  --   --  4.4  --  3.9  --   CL 84* 83*  --   --  95*  --  87*  --   CO2 24 22  --   --  21*  --  24  --   GLUCOSE 97 91  --   --  104*  --  102*  --   BUN 23* 10  --   --  7  --  <5*  --   CREATININE 0.61 0.43*  --   --  0.39*  --  0.35*  --   CALCIUM 9.0 8.5*  --   --  7.7*  --  8.3*  --   MG  --  2.0  --   --  1.7  --   --   --    < > = values in this interval not displayed.   Liver Function Tests: Recent Labs  Lab 05/11/23 1512 05/12/23 0940 05/13/23 0222 05/14/23 0454  AST 99* 120* 121* 123*  ALT 38 40 40 44  ALKPHOS 74 72 58 64  BILITOT 2.2* 1.9* 2.0* 1.7*  PROT 8.0 7.4 6.1* 6.5  ALBUMIN 3.5 3.3* 2.6* 2.8*   Recent Labs  Lab 05/11/23 1512 05/12/23 1421  LIPASE 73* 91*   No results for  input(s): "AMMONIA" in the last 168 hours. CBC: Recent Labs  Lab 05/11/23 1512 05/12/23 0940 05/13/23 0222 05/14/23 0454  WBC 6.0 4.6 3.3* 3.9*  NEUTROABS 4.0 3.4  --   --   HGB 10.8* 9.5* 8.3* 8.5*  HCT 30.1* 26.5* 24.1* 25.4*  MCV 100.0 99.6 103.4* 102.0*  PLT 100* 91* PLATELETS APPEAR DECREASED 91*   Cardiac Enzymes: No results for input(s): "CKTOTAL", "CKMB", "CKMBINDEX", "TROPONINI" in the last 168 hours. BNP: Invalid input(s): "POCBNP" CBG: No results for input(s): "GLUCAP" in the last 168 hours. D-Dimer No results for input(s): "DDIMER" in the last 72 hours. Hgb A1c No results for input(s): "HGBA1C" in the last 72 hours. Lipid Profile No results for input(s): "CHOL", "HDL", "LDLCALC", "TRIG", "CHOLHDL", "LDLDIRECT" in the last 72 hours.  Thyroid  function studies Recent Labs    05/12/23 1421  TSH 2.575   Anemia work up Recent Labs    05/12/23 0930 05/12/23 0943  VITAMINB12 704  --   FOLATE  --  13.6  FERRITIN 266  --   TIBC 317  --   IRON 119  --   RETICCTPCT  --  3.0   Urinalysis    Component Value Date/Time   COLORURINE YELLOW 05/11/2023 1334   APPEARANCEUR CLEAR 05/11/2023 1334   LABSPEC 1.017 05/11/2023 1334   PHURINE 7.0 05/11/2023 1334   GLUCOSEU NEGATIVE 05/11/2023 1334   HGBUR SMALL (A) 05/11/2023 1334   BILIRUBINUR NEGATIVE 05/11/2023 1334   KETONESUR 5 (A) 05/11/2023 1334   PROTEINUR NEGATIVE 05/11/2023 1334   NITRITE NEGATIVE 05/11/2023 1334   LEUKOCYTESUR NEGATIVE 05/11/2023 1334   Sepsis Labs Recent Labs  Lab 05/11/23 1512 05/12/23 0940 05/13/23 0222 05/14/23 0454  WBC 6.0 4.6 3.3* 3.9*   Microbiology Recent Results (from the past 240 hours)  Blood culture (routine x 2)     Status: None (Preliminary result)   Collection Time: 05/11/23  3:09 PM   Specimen: BLOOD  Result Value Ref Range Status   Specimen Description BLOOD LEFT ANTECUBITAL  Final   Special Requests   Final    BOTTLES DRAWN AEROBIC AND ANAEROBIC Blood Culture  adequate volume   Culture   Final    NO GROWTH 3 DAYS Performed at [facility], [address] [place], [name] [address]    Report Status PENDING  Incomplete  Blood culture (routine x 2)     Status: None (Preliminary result)   Collection Time: 05/11/23  3:14 PM   Specimen: BLOOD  Result Value Ref Range Status   Specimen Description BLOOD RIGHT ANTECUBITAL  Final   Special Requests   Final    BOTTLES DRAWN AEROBIC AND ANAEROBIC Blood Culture adequate volume   Culture   Final    NO GROWTH 3 DAYS Performed at [facility], [address], [place], [name] [address]    Report Status PENDING  Incomplete  MRSA Next Gen by PCR, Nasal     Status: None   Collection Time: 05/12/23  2:14 PM   Specimen: Nasal Mucosa; Nasal Swab  Result Value Ref Range Status   MRSA by PCR Next Gen NOT DETECTED NOT DETECTED Final    Comment: (NOTE) The GeneXpert MRSA Assay (FDA approved for NASAL specimens only), is one component of a comprehensive MRSA colonization surveillance program. It is not intended to diagnose MRSA infection nor to guide or monitor treatment for MRSA infections. Test performance is not FDA approved in patients less than 94 years old. Performed at [facility], [address], [place], [name] [address]      Time coordinating discharge: 35 minutes  SIGNED:   Cornelius Dill, DO [facility] 05/14/2023, 10:01 AM  If 7PM-7AM, please contact night-coverage [email or web address]

## 2023-05-14 NOTE — Progress Notes (Signed)
 Patient's sodium level 118,Dr Mason Sole. Plan of care on going.

## 2023-05-14 NOTE — Plan of Care (Addendum)
 Rt hand IV removed due to occlusion. Catheter intact upon removal, site clean and dry. Pt tolerated well.   Problem: Health Behavior/Discharge Planning: Goal: Ability to manage health-related needs will improve Outcome: Progressing   Problem: Clinical Measurements: Goal: Ability to maintain clinical measurements within normal limits will improve Outcome: Progressing Goal: Will remain free from infection Outcome: Progressing Goal: Diagnostic test results will improve Outcome: Progressing   Problem: Activity: Goal: Risk for activity intolerance will decrease Outcome: Progressing   Problem: Coping: Goal: Level of anxiety will decrease Outcome: Progressing   Problem: Elimination: Goal: Will not experience complications related to bowel motility Outcome: Progressing   Problem: Pain Managment: Goal: General experience of comfort will improve and/or be controlled Outcome: Progressing   Problem: Safety: Goal: Ability to remain free from injury will improve Outcome: Progressing   Problem: Skin Integrity: Goal: Risk for impaired skin integrity will decrease Outcome: Progressing

## 2023-05-14 NOTE — Progress Notes (Signed)
   05/14/23 1610  Provider Notification  Provider Name/Title Dr. Elyse Hand  Date Provider Notified 05/14/23  Time Provider Notified [phone]  Method of Notification Page  Notification Reason Critical Result  Test performed and critical result Na-118  Date Critical Result Received 05/14/23  Time Critical Result Received 0612  Provider response Other (Comment) (awaiting orders at this time\)

## 2023-05-14 NOTE — Progress Notes (Signed)
 Patient alert,and oriented times four.Patient said she had talk with the MD,and she was leaving against medical advice.Patient noted to have some tremors. No c/o pain or discomfort noted this am.Patient informed on sodium level,and informed of the benefit of staying.Patient stated" I need to take care of some things at home,I wasn't prepared to stay this long" IV discontinued,catheter intact.Plan of care on going.Patient signed AMA papers.

## 2023-05-15 ENCOUNTER — Emergency Department (HOSPITAL_COMMUNITY)

## 2023-05-15 ENCOUNTER — Encounter (HOSPITAL_COMMUNITY): Payer: Self-pay

## 2023-05-15 ENCOUNTER — Inpatient Hospital Stay (HOSPITAL_COMMUNITY)
Admission: EM | Admit: 2023-05-15 | Discharge: 2023-05-18 | DRG: 378 | Discharge status: Against Medical Advice | Attending: Family Medicine | Admitting: Family Medicine

## 2023-05-15 ENCOUNTER — Other Ambulatory Visit: Payer: Self-pay

## 2023-05-15 DIAGNOSIS — K7460 Unspecified cirrhosis of liver: Secondary | ICD-10-CM | POA: Diagnosis not present

## 2023-05-15 DIAGNOSIS — F1010 Alcohol abuse, uncomplicated: Secondary | ICD-10-CM | POA: Diagnosis present

## 2023-05-15 DIAGNOSIS — R1013 Epigastric pain: Secondary | ICD-10-CM

## 2023-05-15 DIAGNOSIS — K2920 Alcoholic gastritis without bleeding: Secondary | ICD-10-CM | POA: Diagnosis present

## 2023-05-15 DIAGNOSIS — K7010 Alcoholic hepatitis without ascites: Secondary | ICD-10-CM | POA: Diagnosis present

## 2023-05-15 DIAGNOSIS — Z79899 Other long term (current) drug therapy: Secondary | ICD-10-CM

## 2023-05-15 DIAGNOSIS — F17210 Nicotine dependence, cigarettes, uncomplicated: Secondary | ICD-10-CM | POA: Diagnosis present

## 2023-05-15 DIAGNOSIS — K3189 Other diseases of stomach and duodenum: Secondary | ICD-10-CM | POA: Diagnosis present

## 2023-05-15 DIAGNOSIS — K7030 Alcoholic cirrhosis of liver without ascites: Secondary | ICD-10-CM | POA: Diagnosis present

## 2023-05-15 DIAGNOSIS — K921 Melena: Secondary | ICD-10-CM | POA: Diagnosis not present

## 2023-05-15 DIAGNOSIS — E871 Hypo-osmolality and hyponatremia: Principal | ICD-10-CM | POA: Diagnosis present

## 2023-05-15 DIAGNOSIS — Z807 Family history of other malignant neoplasms of lymphoid, hematopoietic and related tissues: Secondary | ICD-10-CM

## 2023-05-15 DIAGNOSIS — R933 Abnormal findings on diagnostic imaging of other parts of digestive tract: Secondary | ICD-10-CM | POA: Diagnosis not present

## 2023-05-15 DIAGNOSIS — D6959 Other secondary thrombocytopenia: Secondary | ICD-10-CM | POA: Diagnosis present

## 2023-05-15 DIAGNOSIS — K254 Chronic or unspecified gastric ulcer with hemorrhage: Principal | ICD-10-CM | POA: Diagnosis present

## 2023-05-15 DIAGNOSIS — K219 Gastro-esophageal reflux disease without esophagitis: Secondary | ICD-10-CM | POA: Diagnosis present

## 2023-05-15 DIAGNOSIS — R197 Diarrhea, unspecified: Secondary | ICD-10-CM | POA: Diagnosis present

## 2023-05-15 DIAGNOSIS — Z791 Long term (current) use of non-steroidal anti-inflammatories (NSAID): Secondary | ICD-10-CM

## 2023-05-15 DIAGNOSIS — Z5329 Procedure and treatment not carried out because of patient's decision for other reasons: Secondary | ICD-10-CM | POA: Diagnosis present

## 2023-05-15 DIAGNOSIS — Z720 Tobacco use: Secondary | ICD-10-CM | POA: Diagnosis present

## 2023-05-15 DIAGNOSIS — Y902 Blood alcohol level of 40-59 mg/100 ml: Secondary | ICD-10-CM | POA: Diagnosis present

## 2023-05-15 DIAGNOSIS — S92301D Fracture of unspecified metatarsal bone(s), right foot, subsequent encounter for fracture with routine healing: Secondary | ICD-10-CM

## 2023-05-15 DIAGNOSIS — W19XXXD Unspecified fall, subsequent encounter: Secondary | ICD-10-CM | POA: Diagnosis present

## 2023-05-15 DIAGNOSIS — F1090 Alcohol use, unspecified, uncomplicated: Secondary | ICD-10-CM | POA: Diagnosis present

## 2023-05-15 DIAGNOSIS — D649 Anemia, unspecified: Secondary | ICD-10-CM

## 2023-05-15 DIAGNOSIS — Z91030 Bee allergy status: Secondary | ICD-10-CM

## 2023-05-15 DIAGNOSIS — I10 Essential (primary) hypertension: Secondary | ICD-10-CM | POA: Diagnosis present

## 2023-05-15 DIAGNOSIS — R110 Nausea: Secondary | ICD-10-CM | POA: Diagnosis not present

## 2023-05-15 DIAGNOSIS — Z806 Family history of leukemia: Secondary | ICD-10-CM

## 2023-05-15 DIAGNOSIS — E876 Hypokalemia: Secondary | ICD-10-CM | POA: Diagnosis not present

## 2023-05-15 DIAGNOSIS — S92901A Unspecified fracture of right foot, initial encounter for closed fracture: Secondary | ICD-10-CM | POA: Diagnosis not present

## 2023-05-15 DIAGNOSIS — K766 Portal hypertension: Secondary | ICD-10-CM | POA: Diagnosis present

## 2023-05-15 DIAGNOSIS — D62 Acute posthemorrhagic anemia: Secondary | ICD-10-CM | POA: Diagnosis present

## 2023-05-15 DIAGNOSIS — K2100 Gastro-esophageal reflux disease with esophagitis, without bleeding: Secondary | ICD-10-CM | POA: Diagnosis present

## 2023-05-15 DIAGNOSIS — Z91040 Latex allergy status: Secondary | ICD-10-CM

## 2023-05-15 DIAGNOSIS — Z809 Family history of malignant neoplasm, unspecified: Secondary | ICD-10-CM

## 2023-05-15 DIAGNOSIS — K580 Irritable bowel syndrome with diarrhea: Secondary | ICD-10-CM | POA: Diagnosis present

## 2023-05-15 LAB — ETHANOL: Alcohol, Ethyl (B): 46 mg/dL — ABNORMAL HIGH (ref <15)

## 2023-05-15 LAB — URINALYSIS, ROUTINE W REFLEX MICROSCOPIC
Bilirubin Urine: NEGATIVE
Glucose, UA: NEGATIVE mg/dL
Hgb urine dipstick: NEGATIVE
Ketones, ur: 5 mg/dL — AB
Leukocytes,Ua: NEGATIVE
Nitrite: NEGATIVE
Protein, ur: NEGATIVE mg/dL
Specific Gravity, Urine: 1.006 (ref 1.005–1.030)
pH: 6 (ref 5.0–8.0)

## 2023-05-15 LAB — CBC WITH DIFFERENTIAL/PLATELET
Abs Immature Granulocytes: 0.02 10*3/uL (ref 0.00–0.07)
Basophils Absolute: 0 10*3/uL (ref 0.0–0.1)
Basophils Relative: 0 %
Eosinophils Absolute: 0 10*3/uL (ref 0.0–0.5)
Eosinophils Relative: 0 %
HCT: 27.6 % — ABNORMAL LOW (ref 36.0–46.0)
Hemoglobin: 9.9 g/dL — ABNORMAL LOW (ref 12.0–15.0)
Immature Granulocytes: 0 %
Lymphocytes Relative: 16 %
Lymphs Abs: 0.9 10*3/uL (ref 0.7–4.0)
MCH: 36 pg — ABNORMAL HIGH (ref 26.0–34.0)
MCHC: 35.9 g/dL (ref 30.0–36.0)
MCV: 100.4 fL — ABNORMAL HIGH (ref 80.0–100.0)
Monocytes Absolute: 0.9 10*3/uL (ref 0.1–1.0)
Monocytes Relative: 16 %
Neutro Abs: 3.7 10*3/uL (ref 1.7–7.7)
Neutrophils Relative %: 68 %
Platelets: 126 10*3/uL — ABNORMAL LOW (ref 150–400)
RBC: 2.75 MIL/uL — ABNORMAL LOW (ref 3.87–5.11)
RDW: 13.1 % (ref 11.5–15.5)
WBC: 5.6 10*3/uL (ref 4.0–10.5)
nRBC: 0 % (ref 0.0–0.2)

## 2023-05-15 LAB — COMPREHENSIVE METABOLIC PANEL WITH GFR
ALT: 52 U/L — ABNORMAL HIGH (ref 0–44)
AST: 116 U/L — ABNORMAL HIGH (ref 15–41)
Albumin: 3.1 g/dL — ABNORMAL LOW (ref 3.5–5.0)
Alkaline Phosphatase: 75 U/L (ref 38–126)
Anion gap: 13 (ref 5–15)
BUN: <5 mg/dL — ABNORMAL LOW (ref 6–20)
CO2: 22 mmol/L (ref 22–32)
Calcium: 8.7 mg/dL — ABNORMAL LOW (ref 8.9–10.3)
Chloride: 85 mmol/L — ABNORMAL LOW (ref 98–111)
Creatinine, Ser: 0.41 mg/dL — ABNORMAL LOW (ref 0.44–1.00)
GFR, Estimated: >60 mL/min (ref >60)
Glucose, Bld: 82 mg/dL (ref 70–99)
Potassium: 3.5 mmol/L (ref 3.5–5.1)
Sodium: 120 mmol/L — ABNORMAL LOW (ref 135–145)
Total Bilirubin: 1.5 mg/dL — ABNORMAL HIGH (ref 0.0–1.2)
Total Protein: 7.2 g/dL (ref 6.5–8.1)

## 2023-05-15 LAB — RAPID URINE DRUG SCREEN, HOSP PERFORMED
Amphetamines: NOT DETECTED
Barbiturates: NOT DETECTED
Benzodiazepines: NOT DETECTED
Cocaine: NOT DETECTED
Opiates: NOT DETECTED
Tetrahydrocannabinol: NOT DETECTED

## 2023-05-15 LAB — LIPASE, BLOOD: Lipase: 49 U/L (ref 11–51)

## 2023-05-15 LAB — TISSUE TRANSGLUTAMINASE, IGA: Tissue Transglutaminase Ab, IgA: <2 U/mL (ref 0–3)

## 2023-05-15 LAB — IGA: IgA: 906 mg/dL — ABNORMAL HIGH (ref 87–352)

## 2023-05-15 MED ORDER — SODIUM CHLORIDE 0.9 % IV SOLN
1.0000 g | Freq: Once | INTRAVENOUS | Status: AC
  Administered 2023-05-15: 1 g via INTRAVENOUS
  Filled 2023-05-15: qty 10

## 2023-05-15 MED ORDER — ONDANSETRON HCL 4 MG/2ML IJ SOLN
4.0000 mg | Freq: Four times a day (QID) | INTRAMUSCULAR | Status: DC | PRN
  Administered 2023-05-15: 4 mg via INTRAVENOUS
  Filled 2023-05-15: qty 2

## 2023-05-15 MED ORDER — ACETAMINOPHEN 325 MG PO TABS
650.0000 mg | ORAL_TABLET | Freq: Four times a day (QID) | ORAL | Status: DC | PRN
  Administered 2023-05-15 – 2023-05-18 (×9): 650 mg via ORAL
  Filled 2023-05-15 (×9): qty 2

## 2023-05-15 MED ORDER — LORAZEPAM 2 MG/ML IJ SOLN
1.0000 mg | INTRAMUSCULAR | Status: AC | PRN
  Administered 2023-05-16: 1 mg via INTRAVENOUS
  Filled 2023-05-15: qty 1

## 2023-05-15 MED ORDER — GABAPENTIN 100 MG PO CAPS
100.0000 mg | ORAL_CAPSULE | Freq: Every day | ORAL | Status: DC | PRN
  Administered 2023-05-18: 100 mg via ORAL
  Filled 2023-05-15: qty 1

## 2023-05-15 MED ORDER — PANTOPRAZOLE SODIUM 40 MG IV SOLR
40.0000 mg | Freq: Once | INTRAVENOUS | Status: AC
  Administered 2023-05-15: 40 mg via INTRAVENOUS
  Filled 2023-05-15: qty 10

## 2023-05-15 MED ORDER — THIAMINE MONONITRATE 100 MG PO TABS
100.0000 mg | ORAL_TABLET | Freq: Every day | ORAL | Status: DC
  Administered 2023-05-16 – 2023-05-18 (×3): 100 mg via ORAL
  Filled 2023-05-15 (×4): qty 1

## 2023-05-15 MED ORDER — SODIUM CHLORIDE 0.9 % IV SOLN: INTRAVENOUS | Status: DC

## 2023-05-15 MED ORDER — LORAZEPAM 1 MG PO TABS: 1.0000 mg | ORAL_TABLET | ORAL | Status: AC | PRN

## 2023-05-15 MED ORDER — ACETAMINOPHEN 650 MG RE SUPP
650.0000 mg | Freq: Four times a day (QID) | RECTAL | Status: DC | PRN
  Filled 2023-05-15: qty 1

## 2023-05-15 MED ORDER — NICOTINE 21 MG/24HR TD PT24
21.0000 mg | MEDICATED_PATCH | Freq: Every day | TRANSDERMAL | Status: DC
  Filled 2023-05-15 (×2): qty 1

## 2023-05-15 MED ORDER — FOLIC ACID 1 MG PO TABS
1.0000 mg | ORAL_TABLET | Freq: Every day | ORAL | Status: DC
  Administered 2023-05-15 – 2023-05-18 (×4): 1 mg via ORAL
  Filled 2023-05-15 (×4): qty 1

## 2023-05-15 MED ORDER — BISACODYL 10 MG RE SUPP: 10.0000 mg | Freq: Every day | RECTAL | Status: DC | PRN

## 2023-05-15 MED ORDER — SODIUM CHLORIDE 0.9 % IV SOLN: INTRAVENOUS | Status: AC | PRN

## 2023-05-15 MED ORDER — THIAMINE HCL 100 MG/ML IJ SOLN
100.0000 mg | Freq: Every day | INTRAMUSCULAR | Status: DC
  Administered 2023-05-15: 100 mg via INTRAVENOUS
  Filled 2023-05-15: qty 2

## 2023-05-15 MED ORDER — SODIUM CHLORIDE 0.9% FLUSH
3.0000 mL | Freq: Two times a day (BID) | INTRAVENOUS | Status: DC
  Administered 2023-05-15 – 2023-05-18 (×3): 3 mL via INTRAVENOUS

## 2023-05-15 MED ORDER — ADULT MULTIVITAMIN W/MINERALS CH
1.0000 | ORAL_TABLET | Freq: Every day | ORAL | Status: DC
  Administered 2023-05-15 – 2023-05-18 (×4): 1 via ORAL
  Filled 2023-05-15 (×4): qty 1

## 2023-05-15 MED ORDER — ONDANSETRON HCL 4 MG PO TABS
4.0000 mg | ORAL_TABLET | Freq: Four times a day (QID) | ORAL | Status: DC | PRN
Start: 2023-05-15 — End: 2023-05-19
  Administered 2023-05-18: 4 mg via ORAL
  Filled 2023-05-15: qty 1

## 2023-05-15 MED ORDER — SODIUM CHLORIDE 0.9% FLUSH: 3.0000 mL | INTRAVENOUS | Status: DC | PRN

## 2023-05-15 MED ORDER — POLYETHYLENE GLYCOL 3350 17 G PO PACK: 17.0000 g | PACK | Freq: Every day | ORAL | Status: DC | PRN

## 2023-05-15 MED ORDER — TRAZODONE HCL 50 MG PO TABS
50.0000 mg | ORAL_TABLET | Freq: Every evening | ORAL | Status: DC | PRN
  Administered 2023-05-15 – 2023-05-17 (×3): 50 mg via ORAL
  Filled 2023-05-15 (×3): qty 1

## 2023-05-15 MED ORDER — SODIUM CHLORIDE 0.9% FLUSH
3.0000 mL | Freq: Two times a day (BID) | INTRAVENOUS | Status: DC
  Administered 2023-05-15 – 2023-05-18 (×7): 3 mL via INTRAVENOUS

## 2023-05-15 MED ORDER — PANTOPRAZOLE SODIUM 40 MG IV SOLR
40.0000 mg | Freq: Two times a day (BID) | INTRAVENOUS | Status: DC
  Administered 2023-05-15 – 2023-05-18 (×7): 40 mg via INTRAVENOUS
  Filled 2023-05-15 (×7): qty 10

## 2023-05-15 MED ORDER — ONDANSETRON HCL 4 MG/2ML IJ SOLN
4.0000 mg | Freq: Once | INTRAMUSCULAR | Status: AC
  Administered 2023-05-15: 4 mg via INTRAVENOUS
  Filled 2023-05-15: qty 2

## 2023-05-15 MED ORDER — OXYCODONE HCL 5 MG PO TABS
5.0000 mg | ORAL_TABLET | Freq: Once | ORAL | Status: AC | PRN
  Administered 2023-05-17: 5 mg via ORAL
  Filled 2023-05-15 (×2): qty 1

## 2023-05-15 NOTE — ED Notes (Signed)
 ED TO INPATIENT HANDOFF REPORT  ED Nurse Name and Phone #:   S Name/Age/Gender Joan Wood 53 y.o. female Room/Bed: APA18/APA18  Code Status   Code Status: Full Code  Home/SNF/Other Home Patient oriented to: self, place, time, and situation Is this baseline? Yes   Triage Complete: Triage complete  Chief Complaint Hyponatremia [E87.1]  Triage Note Pt arrived via POV c/o recurrent abdominal pain, right foot pain from recent injury and reports she left AMA yesterday, but changed her mind and decided to come back to the hospital to receive help and PT for her foot pain.    Allergies Allergies  Allergen Reactions   Bee Venom Anaphylaxis    Cannot take epi for this allergy d/t heart condition per pt   Latex Rash    Level of Care/Admitting Diagnosis ED Disposition     ED Disposition  Admit   Condition  --   Comment  Hospital Area: [facility] [[id number]]  Level of Care: Telemetry [5]  Covid Evaluation: Asymptomatic - no recent exposure (last 10 days) testing not required  Diagnosis: Hyponatremia [161096]  Admitting Physician: Robb Child  Attending Physician: Robb Child          B Medical/Surgery History Past Medical History:  Diagnosis Date   Anxiety    GERD (gastroesophageal reflux disease)    Hernia of abdominal wall    HTN (hypertension)    SVT (supraventricular tachycardia) (HCC)    Past Surgical History:  Procedure Laterality Date   FINGER FRACTURE SURGERY     right ankle repair     age 39     A IV Location/Drains/Wounds Patient Lines/Drains/Airways Status     Active Line/Drains/Airways     Name Placement date Placement time Site Days   Peripheral IV 05/12/23 20 G Left Antecubital 05/12/23  0940  Antecubital  3   Peripheral IV 05/15/23 20 G Right Antecubital 05/15/23  1156  Antecubital  less than 1            Intake/Output Last 24 hours  Intake/Output Summary (Last 24 hours) at 05/15/2023  1504 Last data filed at 05/15/2023 1256 Gross per 24 hour  Intake 98.7 ml  Output --  Net 98.7 ml    Labs/Imaging Results for orders placed or performed during the hospital encounter of 05/15/23 (from the past 48 hours)  Lipase, blood     Status: None   Collection Time: 05/15/23 11:54 AM  Result Value Ref Range   Lipase 49 11 - 51 U/L    Comment: Performed at [facility], [address], [place], [name] [address]  Ethanol     Status: Abnormal   Collection Time: 05/15/23 11:54 AM  Result Value Ref Range   Alcohol, Ethyl (B) 46 (H) <15 mg/dL    Comment: Please note change in reference range. (NOTE) For medical purposes only. Performed at [facility], [address] [place], [name] [address]   CBC with Differential/Platelet     Status: Abnormal   Collection Time: 05/15/23 12:48 PM  Result Value Ref Range   WBC 5.6 4.0 - 10.5 K/uL   RBC 2.75 (L) 3.87 - 5.11 MIL/uL   Hemoglobin 9.9 (L) 12.0 - 15.0 g/dL   HCT 14.7 (L) 82.9 - 56.2 %   MCV 100.4 (H) 80.0 - 100.0 fL   MCH 36.0 (H) 26.0 - 34.0 pg   MCHC 35.9 30.0 - 36.0 g/dL   RDW 13.0 86.5 - 78.4 %   Platelets 126 (  L) 150 - 400 K/uL    Comment: REPEATED TO VERIFY   nRBC 0.0 0.0 - 0.2 %   Neutrophils Relative % 68 %   Neutro Abs 3.7 1.7 - 7.7 K/uL   Lymphocytes Relative 16 %   Lymphs Abs 0.9 0.7 - 4.0 K/uL   Monocytes Relative 16 %   Monocytes Absolute 0.9 0.1 - 1.0 K/uL   Eosinophils Relative 0 %   Eosinophils Absolute 0.0 0.0 - 0.5 K/uL   Basophils Relative 0 %   Basophils Absolute 0.0 0.0 - 0.1 K/uL   Immature Granulocytes 0 %   Abs Immature Granulocytes 0.02 0.00 - 0.07 K/uL    Comment: Performed at [facility], [address] [place], [name] [address]  Comprehensive metabolic panel with GFR     Status: Abnormal   Collection Time: 05/15/23 12:48 PM  Result Value Ref Range   Sodium 120 (L) 135 - 145 mmol/L   Potassium 3.5 3.5 - 5.1 mmol/L   Chloride 85 (L) 98 - 111 mmol/L   CO2 22 22 - 32 mmol/L   Glucose, Bld 82  70 - 99 mg/dL    Comment: Glucose reference range applies only to samples taken after fasting for at least 8 hours.   BUN <5 (L) 6 - 20 mg/dL   Creatinine, Ser 0.98 (L) 0.44 - 1.00 mg/dL   Calcium 8.7 (L) 8.9 - 10.3 mg/dL   Total Protein 7.2 6.5 - 8.1 g/dL   Albumin 3.1 (L) 3.5 - 5.0 g/dL   AST 119 (H) 15 - 41 U/L   ALT 52 (H) 0 - 44 U/L   Alkaline Phosphatase 75 38 - 126 U/L   Total Bilirubin 1.5 (H) 0.0 - 1.2 mg/dL   GFR, Estimated >14 >78 mL/min    Comment: (NOTE) Calculated using the CKD-EPI Creatinine Equation (2021)    Anion gap 13 5 - 15    Comment: Performed at [facility], [address], [place], [name] [address]   DG Foot Complete Right Result Date: 05/15/2023 CLINICAL DATA:  Prior fracture EXAM: RIGHT FOOT COMPLETE - 3+ VIEW COMPARISON:  04/15/2023 FINDINGS: Bony demineralization. Transverse fractures of the proximal metaphyses of the second, third, likely fourth metatarsals. These remain nondisplaced. No current malalignment at the Lisfranc joint. Dorsal soft tissue swelling along the forefoot. IMPRESSION: 1. Transverse fractures of the proximal metaphyses of the second, third, and likely fourth metatarsals. These remain nondisplaced. 2. Dorsal soft tissue swelling along the forefoot. 3. Bony demineralization. Electronically Signed   By: Freida Jes M.D.   On: 05/15/2023 12:47    Pending Labs Unresulted Labs (From admission, onward)     Start     Ordered   05/15/23 1414  Rapid urine drug screen (hospital performed)  Once,   R        05/15/23 1413   05/15/23 1058  CBC with Differential  (ED Abdominal Pain)  Once,   STAT        05/15/23 1058   05/15/23 1058  Urinalysis, Routine w reflex microscopic -Urine, Clean Catch  (ED Abdominal Pain)  Once,   URGENT       Question:  Specimen Source  Answer:  Urine, Clean Catch   05/15/23 1058            Vitals/Pain Today's Vitals   05/15/23 1021 05/15/23 1022  BP: 112/79   Pulse: (!) 114   Resp: 18   Temp: 98.4 F  (36.9 C)   TempSrc: Oral  SpO2: 99%   Weight:  56.2 kg  Height:  5\' 2"  (1.575 m)  PainSc: 10-Worst pain ever     Isolation Precautions No active isolations  Medications Medications  LORazepam (ATIVAN) tablet 1-4 mg (has no administration in time range)    Or  LORazepam (ATIVAN) injection 1-4 mg (has no administration in time range)  thiamine  (VITAMIN B1) tablet 100 mg ( Oral See Alternative 05/15/23 1410)    Or  thiamine  (VITAMIN B1) injection 100 mg (100 mg Intravenous Given 05/15/23 1410)  folic acid  (FOLVITE ) tablet 1 mg (1 mg Oral Given 05/15/23 1410)  multivitamin with minerals tablet 1 tablet (1 tablet Oral Given 05/15/23 1409)  pantoprazole  (PROTONIX ) injection 40 mg (40 mg Intravenous Given 05/15/23 1410)  gabapentin  (NEURONTIN ) capsule 100 mg (has no administration in time range)  sodium chloride  flush (NS) 0.9 % injection 3 mL (has no administration in time range)  sodium chloride  flush (NS) 0.9 % injection 3 mL (has no administration in time range)  sodium chloride  flush (NS) 0.9 % injection 3 mL (has no administration in time range)  0.9 %  sodium chloride  infusion (has no administration in time range)  acetaminophen  (TYLENOL ) tablet 650 mg (has no administration in time range)    Or  acetaminophen  (TYLENOL ) suppository 650 mg (has no administration in time range)  traZODone (DESYREL) tablet 50 mg (has no administration in time range)  polyethylene glycol (MIRALAX / GLYCOLAX) packet 17 g (has no administration in time range)  bisacodyl (DULCOLAX) suppository 10 mg (has no administration in time range)  ondansetron  (ZOFRAN ) tablet 4 mg (has no administration in time range)    Or  ondansetron  (ZOFRAN ) injection 4 mg (has no administration in time range)  ondansetron  (ZOFRAN ) injection 4 mg (4 mg Intravenous Given 05/15/23 1156)  pantoprazole  (PROTONIX ) injection 40 mg (40 mg Intravenous Given 05/15/23 1156)  cefTRIAXone (ROCEPHIN) 1 g in sodium chloride  0.9 % 100 mL IVPB (0 g  Intravenous Stopped 05/15/23 1256)    Mobility walks with person assist     Focused Assessments    R Recommendations: See Admitting Provider Note  Report given to:   Additional Notes:

## 2023-05-15 NOTE — Progress Notes (Signed)
 Subjective: States she had to go home yesterday to get things she needed for prolonged hospital stay as she only brought something for overnight.   Last BM was Sunday and was black. Hasn't eaten any solid food in several days.  Can handle water. Last vomited yesterday. States it was several times. Some nausea, but no vomiting today.  No bright red blood or  coffee ground emesis. Continues to have pain in upper abdomen.   Having a lot of back/rib pain where she fell on Sunday.  Also having a lot of pain in her right foot due to recent fracture.   ETOH: 2 beer last night. States she is weaning herself off. Used to drink 5-7 beer per day, but has been trying to cut back for the last week. Doesn't drink daily at this time.   No HE, LE edema, or abdominal distension.   Objective: Vital signs in last 24 hours: Temp:  [98.4 F (36.9 C)] 98.4 F (36.9 C) (05/06 1021) Pulse Rate:  [114] 114 (05/06 1021) Resp:  [18] 18 (05/06 1021) BP: (112)/(79) 112/79 (05/06 1021) SpO2:  [99 %] 99 % (05/06 1021) Weight:  [56.2 kg] 56.2 kg (05/06 1022)   General:   Alert and oriented, pleasant, NAD.  Head:  Normocephalic and atraumatic. Eyes:  No icterus. Mouth:  Without lesions, mucosa pink and moist.  Heart:  S1, S2 present, no murmurs noted.  Lungs: Clear to auscultation bilaterally, without wheezing, rales, or rhonchi.  Abdomen:  Bowel sounds present, full, soft. Mild TTP across lower abdomen and in epigastric area.  No rebound or guarding. Msk:  Symmetrical without gross deformities. Normal posture. Extremities:  Without edema. Neurologic:  Alert and  oriented x4;  grossly normal neurologically. Psych: Normal mood and affect.  Intake/Output from previous day: No intake/output data recorded. Intake/Output this shift: Total I/O In: 98.7 [IV Piggyback:98.7] Out: -   Lab Results: Recent Labs    05/13/23 0222 05/14/23 0454 05/15/23 1248  WBC 3.3* 3.9* 5.6  HGB 8.3* 8.5* 9.9*  HCT 24.1*  25.4* 27.6*  PLT PLATELETS APPEAR DECREASED 91* 126*   BMET Recent Labs    05/13/23 0222 05/13/23 0754 05/14/23 0454 05/14/23 0856 05/15/23 1248  NA 123*   < > 118* 122* 120*  K 4.4  --  3.9  --  3.5  CL 95*  --  87*  --  85*  CO2 21*  --  24  --  22  GLUCOSE 104*  --  102*  --  82  BUN 7  --  <5*  --  <5*  CREATININE 0.39*  --  0.35*  --  0.41*  CALCIUM 7.7*  --  8.3*  --  8.7*   < > = values in this interval not displayed.   LFT Recent Labs    05/13/23 0222 05/14/23 0454 05/15/23 1248  PROT 6.1* 6.5 7.2  ALBUMIN 2.6* 2.8* 3.1*  AST 121* 123* 116*  ALT 40 44 52*  ALKPHOS 58 64 75  BILITOT 2.0* 1.7* 1.5*   PT/INR Recent Labs    05/13/23 0222 05/14/23 0454  LABPROT 16.2* 15.8*  INR 1.3* 1.2    Studies/Results: DG Foot Complete Right Result Date: 05/15/2023 CLINICAL DATA:  Prior fracture EXAM: RIGHT FOOT COMPLETE - 3+ VIEW COMPARISON:  04/15/2023 FINDINGS: Bony demineralization. Transverse fractures of the proximal metaphyses of the second, third, likely fourth metatarsals. These remain nondisplaced. No current malalignment at the Lisfranc joint. Dorsal soft tissue swelling along  the forefoot. IMPRESSION: 1. Transverse fractures of the proximal metaphyses of the second, third, and likely fourth metatarsals. These remain nondisplaced. 2. Dorsal soft tissue swelling along the forefoot. 3. Bony demineralization. Electronically Signed   By: Freida Jes M.D.   On: 05/15/2023 12:47    Assessment: 53 y.o. female with a history of alcohol abuse, GERD, HTN, SVT, periumbilical hernia, possible alcoholic cirrhosis, IBS with diarrhea, who was recently admitted to the hospital on 05/12/2023 after presenting with worsening abdominal pain, nausea, vomiting, melena.  GI was consulted for evaluation of possible upper GI bleeding.  She was found to have features on imaging concerning for cirrhosis as well as gastric wall thickening. She was treated with ceftriaxone for SBP  prophylaxis, octreotide drip, and IV PPI twice daily with plans to proceed with endoscopy once severe hyponatremia had improved.  Unfortunately, patient left AMA yesterday to take care of some things at home and stated she would be back to the hospital in the next 1 to 2 days. She returned to the hospital today.    Melena/Anemia:  New onset dark or tarry stool a few weeks ago with last episode was 05/13/23. She denies taking any NSAIDs or any anticoagulants. Found to have Hgb 10.8 on 05/11/2023, down from 12-13 range.  Hemoglobin declined as low as 8.3 on 05/13/23, but is back up to 9.9 today.  CT showed evidence of cirrhosis, but presentation seems less consistent with recent variceal bleeding as she was never hemodynamically unstable.  More likely bleeding from peptic ulcer disease as  CT also showed gastric wall thickening which is in the setting of chronic alcohol use.  At this point, we will resume IV PPI twice daily and monitor for recurrent overt GI bleeding.  If this were to occur, would recommend resuming Rocephin for SBP prophylaxis and octreotide.  Otherwise, needs EGD once significant hyponatremia has improved.  Hyponatremia: In the setting of nausea, vomiting, diarrhea, chronic alcohol abuse.  Sodium 120 today.  Management per hospitalist.  Nausea/vomiting:  Etiology unclear.  Could be related to underlying peptic ulcer disease as CT shows evidence of gastric wall thickening.  No vomiting today, but continues with some mild nausea and epigastric abdominal pain.  Will resume IV PPI twice daily and plan for EGD once hyponatremia has improved.  Diarrhea: Recurrent episodes of diarrhea with multiple bowel movements per day x 1 month.  TSH within normal limits.  Celiac screen pending.  However, last bowel movement was Sunday.  May be secondary to IBS, but will need to complete stool testing if diarrhea returns to rule out infectious etiology as previously recommended.   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  Clinically remaining well compensated. MELD 3.0 was 21 yesterday.  Can repeat INR tomorrow for MELD recalculation.  Needs alcohol cessation.     Plan: CBC, CMP INR tomorrow.  IV PPI twice daily Antiemetics as needed EGD once hyponatremia improved.  Sodium needs to be around 130.   Management of hyponatremia per hospitalist. Continue to monitor H&H and for overt GI bleeding. If recurrent overt GI bleeding, would recommend ceftriaxone for SBP prophylaxis and octreotide Will need to order stool studies if diarrhea returns. Follow-up on celiac screen Needs strict alcohol cessation.    LOS: 0 days    05/15/2023, 3:17 PM   Shana Daring, [facility]

## 2023-05-15 NOTE — Progress Notes (Signed)
   05/15/23 1838  TOC Brief Assessment  Insurance and Status Reviewed  Patient has primary care physician Yes  Home environment has been reviewed From home  Prior level of function: Independent  Prior/Current Home Services No current home services  Social Drivers of Health Review SDOH reviewed interventions complete  Readmission risk has been reviewed Yes  Transition of care needs no transition of care needs at this time   Transition of Care Department [facility]) has reviewed patient and will continue to monitor. Local alcohol and substance abuse resources added to AVS.

## 2023-05-15 NOTE — Plan of Care (Signed)
   Problem: Education: Goal: Knowledge of General Education information will improve Description Including pain rating scale, medication(s)/side effects and non-pharmacologic comfort measures Outcome: Progressing   Problem: Education: Goal: Knowledge of General Education information will improve Description Including pain rating scale, medication(s)/side effects and non-pharmacologic comfort measures Outcome: Progressing

## 2023-05-15 NOTE — ED Triage Notes (Signed)
 Pt arrived via POV c/o recurrent abdominal pain, right foot pain from recent injury and reports she left AMA yesterday, but changed her mind and decided to come back to the hospital to receive help and PT for her foot pain.

## 2023-05-15 NOTE — ED Notes (Signed)
 MD notified of pt significant pain. Medication requested.

## 2023-05-15 NOTE — Progress Notes (Signed)
 Nurse called for report,ED nurse not available,in with another patient. ED nurse to call when available.Aaron Aas

## 2023-05-15 NOTE — Discharge Instructions (Signed)
 [facility] [phone] [address] [place], [name] [address]  Weekly AA Meetings:  Monday  Blue Plate Special --12:00 noon Open Discussion [place]  - [employer] [address] [place]  Fellowship Group  - 8:00 pm  Open Speaker  [employer]  [address] [place]   Tuesday  [employer] Special --12:00 noon Open Discussion [place]  - [employer] [address] [place]  Fellowship Group  - 8:00 pm  Big Book Quotes Cards Open Discussion  First [address]  [address] [place]   Wednesday  [employer] Special --12:00 noon Open Discussion [place]  - [employer] [address] [place] Group  - 8:00 pm  Big Book Study  [facility] [facility] [address] [place] [facility]   Thursday  [employer] Special --12:00 noon Open Discussion [place]  - [employer] [address] [employer] Noon Meeting - 12:00 noon Open Discussion [employer] [address] [place]  Thursday [place] Group  - 7:00 pm Closed Discussion  [address] [place]. [address] [place]. [place]   Fellowship Group  - 8:00 pm  [employer]  [address] [place]   Friday  [employer] Special --12:00 noon Open Discussion [place]  - [employer] [address] [place]  Take a Load Off  - 7:00 pm Women's Meeting [place] [employer] [address] [facility]  Fellowship Group  - 8:00 pm  Open Discussion [employer]  [address] [place]   Saturday  12 Changes Group  - 9:00 am  Open Discussion  [employer] Building  [address] [place]   [employer] [place] Group  - 6:00 pm Open Discussion [employer] [address] [place]  Fellowship Group  - 8:00 pm  Open Discussion [employer]  [address]  [place]  Sunday  Fellowship Group  - 8:00 pm  Open Discussion/12 & 12 Study [employer] [address]  [address]  Al-Anon: Thursday  [place] Group  - 7:00 pm  [address] [place]  [address] [place]. [place]   ACA  - Adult Children of Alcoholics Saturday Pains of Growing Up  - 6:00pm [employer] [address] [place]  AA Meetings in Surrounding Areas: Tuesday  - 6:30 pm  Open Topic Discussion  [employer]  [address] [place]   Wednesday  - 8:00 pm  Open Literature  [facility]  [address]  [place]  Thursday --6:30 pm Open Big Book [place] [employer] [address] [place]  Sunday  - 6:30 pm  Open Topic Discussion  [employer]  [address] [place]

## 2023-05-15 NOTE — Telephone Encounter (Signed)
 Lvm

## 2023-05-15 NOTE — ED Provider Notes (Signed)
 [place] EMERGENCY DEPARTMENT AT [facility] Provider Note   CSN: [id number] Arrival date & time: 05/15/23  1012     History  Chief Complaint  Patient presents with   Abdominal Pain    Joan Wood is a 53 y.o. female.  She has history of alcohol use disorder, states she has been gradually cutting back, at baseline have been around 5-7 beers a day, currently at 2, last drank 2 beers last night.  She has no history of GERD.  She presents the ER today to be readmitted and finished treatment.  She was initially seen on 05/11/2023 is noted to be hypotensive after having nausea vomiting diarrhea, CT ordered and showed gastritis.  She declined admission at that time stating she was not mentally ready to be admitted and came back the next morning, she was admitted with worsened hyponatremia, worsening anemia, plan for the endoscopy and correction of her hyponatremia.  She unfortunately left AMA yesterday, stating she did not have all of her things, she left to go home presented she wants to complete her treatment, states she is ready to stay, she is back to all of her things including having her cell phone in her wallet.  She states that he still having persistent epigastric pain with nausea.  He also notes that she has fractures in her right foot, has not yet followed up with orthopedics, this done from a fall several weeks ago.  She had a splint on but took it off 1 week ago because it was dirty.   Abdominal Pain      Home Medications Prior to Admission medications   Medication Sig Start Date End Date Taking? Authorizing Provider  acetaminophen  (TYLENOL ) 500 MG tablet Take 500 mg by mouth every 6 (six) hours as needed for moderate pain (pain score 4-6).   Yes [provider]  bismuth subsalicylate (PEPTO BISMOL) 262 MG/15ML suspension Take 30 mLs by mouth every 6 (six) hours as needed for diarrhea or loose stools.   Yes [provider]   diphenhydramine-acetaminophen  (TYLENOL  PM) 25-500 MG TABS tablet Take 1 tablet by mouth at bedtime as needed (pain/sleep).   Yes [provider]  folic acid  (FOLVITE ) 1 MG tablet Take 1 tablet (1 mg total) by mouth daily. 03/29/23  Yes Zarwolo, Gloria, FNP  gabapentin  (NEURONTIN ) 100 MG capsule Take 100 mg by mouth daily as needed (nerve pain).   Yes [provider]  meloxicam  (MOBIC ) 15 MG tablet Take 1 tablet (15 mg total) by mouth daily. Patient taking differently: Take 15 mg by mouth daily as needed for pain. 03/29/23  Yes Zarwolo, Gloria, FNP  omeprazole  (PRILOSEC) 40 MG capsule Take 1 capsule (40 mg total) by mouth daily. 04/11/23  Yes Mahon, Martine Sleek, NP  ondansetron  (ZOFRAN ) 4 MG tablet Take 1 tablet (4 mg total) by mouth 2 (two) times daily as needed for nausea or vomiting. 04/23/23  Yes Alison Irvine, FNP  pyridOXINE (VITAMIN B6) 50 MG tablet Take 1 tablet (50 mg total) by mouth daily. 03/29/23  Yes Zarwolo, Gloria, FNP  Vitamin D , Ergocalciferol , (DRISDOL ) 1.25 MG (50000 UNIT) CAPS capsule Take 1 capsule (50,000 Units total) by mouth every 7 (seven) days. 04/15/23  Yes Zarwolo, Gloria, FNP      Allergies    Bee venom and Latex    Review of Systems   Review of Systems  Gastrointestinal:  Positive for abdominal pain.    Physical Exam Updated Vital Signs BP 119/85 (BP Location:  Left Arm)   Pulse 99   Temp 98.4 F (36.9 C) (Oral)   Resp 18   Ht 5\' 2"  (1.575 m)   Wt 52.9 kg   SpO2 100%   BMI 21.33 kg/m  Physical Exam Vitals and nursing note reviewed.  Constitutional:      General: She is not in acute distress.    Appearance: She is well-developed.  HENT:     Head: Normocephalic and atraumatic.  Eyes:     Extraocular Movements: Extraocular movements intact.     Conjunctiva/sclera: Conjunctivae normal.  Cardiovascular:     Rate and Rhythm: Normal rate and regular rhythm.     Heart sounds: No murmur heard. Pulmonary:     Effort: Pulmonary effort is  normal. No respiratory distress.     Breath sounds: Normal breath sounds.  Abdominal:     Palpations: Abdomen is soft.     Tenderness: There is abdominal tenderness in the epigastric area. There is no guarding or rebound.  Musculoskeletal:        General: No swelling.     Cervical back: Neck supple.  Skin:    General: Skin is warm and dry.     Capillary Refill: Capillary refill takes less than 2 seconds.  Neurological:     General: No focal deficit present.     Mental Status: She is alert and oriented to person, place, and time.  Psychiatric:        Mood and Affect: Mood normal.     ED Results / Procedures / Treatments   Labs (all labs ordered are listed, but only abnormal results are displayed) Labs Reviewed  ETHANOL - Abnormal; Notable for the following components:      Result Value   Alcohol, Ethyl (B) 46 (*)    All other components within normal limits  CBC WITH DIFFERENTIAL/PLATELET - Abnormal; Notable for the following components:   RBC 2.75 (*)    Hemoglobin 9.9 (*)    HCT 27.6 (*)    MCV 100.4 (*)    MCH 36.0 (*)    Platelets 126 (*)    All other components within normal limits  COMPREHENSIVE METABOLIC PANEL WITH GFR - Abnormal; Notable for the following components:   Sodium 120 (*)    Chloride 85 (*)    BUN <5 (*)    Creatinine, Ser 0.41 (*)    Calcium 8.7 (*)    Albumin 3.1 (*)    AST 116 (*)    ALT 52 (*)    Total Bilirubin 1.5 (*)    All other components within normal limits  LIPASE, BLOOD  CBC WITH DIFFERENTIAL/PLATELET  URINALYSIS, ROUTINE W REFLEX MICROSCOPIC  RAPID URINE DRUG SCREEN, HOSP PERFORMED    EKG None  Radiology DG Foot Complete Right Result Date: 05/15/2023 CLINICAL DATA:  Prior fracture EXAM: RIGHT FOOT COMPLETE - 3+ VIEW COMPARISON:  04/15/2023 FINDINGS: Bony demineralization. Transverse fractures of the proximal metaphyses of the second, third, likely fourth metatarsals. These remain nondisplaced. No current malalignment at the  Lisfranc joint. Dorsal soft tissue swelling along the forefoot. IMPRESSION: 1. Transverse fractures of the proximal metaphyses of the second, third, and likely fourth metatarsals. These remain nondisplaced. 2. Dorsal soft tissue swelling along the forefoot. 3. Bony demineralization. Electronically Signed   By: Freida Jes M.D.   On: 05/15/2023 12:47    Procedures Procedures    Medications Ordered in ED Medications  LORazepam (ATIVAN) tablet 1-4 mg (has no administration in time range)  Or  LORazepam (ATIVAN) injection 1-4 mg (has no administration in time range)  thiamine  (VITAMIN B1) tablet 100 mg ( Oral See Alternative 05/15/23 1410)    Or  thiamine  (VITAMIN B1) injection 100 mg (100 mg Intravenous Given 05/15/23 1410)  folic acid  (FOLVITE ) tablet 1 mg (1 mg Oral Given 05/15/23 1410)  multivitamin with minerals tablet 1 tablet (1 tablet Oral Given 05/15/23 1409)  pantoprazole  (PROTONIX ) injection 40 mg (40 mg Intravenous Given 05/15/23 1410)  gabapentin  (NEURONTIN ) capsule 100 mg (has no administration in time range)  sodium chloride  flush (NS) 0.9 % injection 3 mL (3 mLs Intravenous Given 05/15/23 1505)  sodium chloride  flush (NS) 0.9 % injection 3 mL (3 mLs Intravenous Given 05/15/23 1504)  sodium chloride  flush (NS) 0.9 % injection 3 mL (has no administration in time range)  0.9 %  sodium chloride  infusion (has no administration in time range)  acetaminophen  (TYLENOL ) tablet 650 mg (650 mg Oral Given 05/15/23 1748)    Or  acetaminophen  (TYLENOL ) suppository 650 mg ( Rectal See Alternative 05/15/23 1748)  traZODone (DESYREL) tablet 50 mg (has no administration in time range)  polyethylene glycol (MIRALAX / GLYCOLAX) packet 17 g (has no administration in time range)  bisacodyl (DULCOLAX) suppository 10 mg (has no administration in time range)  ondansetron  (ZOFRAN ) tablet 4 mg (has no administration in time range)    Or  ondansetron  (ZOFRAN ) injection 4 mg (has no administration in time  range)  0.9 %  sodium chloride  infusion ( Intravenous New Bag/Given 05/15/23 1606)  ondansetron  (ZOFRAN ) injection 4 mg (4 mg Intravenous Given 05/15/23 1156)  pantoprazole  (PROTONIX ) injection 40 mg (40 mg Intravenous Given 05/15/23 1156)  cefTRIAXone (ROCEPHIN) 1 g in sodium chloride  0.9 % 100 mL IVPB (0 g Intravenous Stopped 05/15/23 1256)    ED Course/ Medical Decision Making/ A&P                                 Medical Decision Making This patient presents to the ED for concern of a be admitted for low sodium, continued abdominal pain, this involves an extensive number of treatment options, and is a complaint that carries with it a high risk of complications and morbidity.  The differential diagnosis includes beer Poto mania, dehydration, SIADH, gastritis, PUD, GI bleeding, other   Co morbidities that complicate the patient evaluation :   Alcohol use disorder, hyponatremia, alcoholic cirrhosis, gastritis   Additional history obtained:  Additional history obtained from EMR External records from outside source obtained and reviewed including labs, hospitalist discharge summary, GI consultation   Lab Tests:  I Ordered, and personally interpreted labs.  The pertinent results include: Sodium 120, was 122 yesterday CBC shows hemoglobin today is 9.9, up from 8.5 yesterday EtOH is 46    Consultations Obtained:  I requested consultation with the hospitalist Dr. Quintella Buck,  and discussed lab and imaging findings as well as pertinent plan - they recommend: Admission   Problem List / ED Course / Critical interventions / Medication management  Patient here to continue care after having signed out of the hospital AMA yesterday-has been having abdominal pain and vomiting with likely gastritis, awaiting normalization of her hyponatremia to be able to have endoscopy.  She is not having further GI bleeding, hemoglobin actually increased from last, sodium 120, she is not having confusion or ataxia or  seizures.  Discussed with hospitalist for admission.  Given Protonix  and Rocephin per  GIs recommendations from last note  I have reviewed the patients home medicines and have made adjustments as needed      Amount and/or Complexity of Data Reviewed Labs: ordered. Radiology: ordered.  Risk Prescription drug management. Decision regarding hospitalization.           Final Clinical Impression(s) / ED Diagnoses Final diagnoses:  Hyponatremia  Anemia, unspecified type    Rx / DC Orders ED Discharge Orders     None         Joshua Nieves 05/15/23 1845    Eldon Greenland, MD 05/17/23 [phone]

## 2023-05-15 NOTE — H&P (Addendum)
 History and Physical    Patient: Joan Wood ZOX:[id number] DOB: June 10, 1970 DOA: 05/15/2023 DOS: the patient was seen and examined on 05/15/2023 PCP: Zarwolo, Gloria, FNP  Patient coming from: Home  Chief Complaint:  Chief Complaint  Patient presents with   Abdominal Pain   HPI: Joan Wood is a 53 y.o. female with medical history significant of history of ongoing alcohol abuse, GERD, HTN, SVT, periumbilical hernia, presumed alcoholic liver cirrhosis, IBS-D, anxiety disorder who was recently admitted to the hospital on 05/12/2023 after presenting with worsening abdominal pain, nausea, vomiting, melena.  Patient left AMA on 05/14/2023-despite being advised to wait for sodium to improve so she can have EGD - Went home and drank more alcohol - Returns to the ED on 05/15/2023--reports no BM since leaving AMA yesterday -In ED - WBC 5.6 hemoglobin 9.9, platelets 126 - Blood alcohol 46, down from 132 3 days ago - lipase is down to 49 from 91 3 days ago -Sodium is low at 120 potassium 3.5 chloride 85 - Creatinine 0.41 - AST 116 ALT 52 T. bili 1.5 - Right foot x-rays show--1. Transverse fractures of the proximal metaphyses of the second, third, and likely fourth metatarsals. These remain nondisplaced.   Review of Systems: As mentioned in the history of present illness. All other systems reviewed and are negative. Past Medical History:  Diagnosis Date   Anxiety    GERD (gastroesophageal reflux disease)    Hernia of abdominal wall    HTN (hypertension)    SVT (supraventricular tachycardia) (HCC)    Past Surgical History:  Procedure Laterality Date   FINGER FRACTURE SURGERY     right ankle repair     age 67   Social History:  reports that she has been smoking cigarettes. She started smoking about 14 months ago. She has a 28.3 pack-year smoking history. She has never used smokeless tobacco. She reports current alcohol use. She reports that she does not use drugs.  Allergies  Allergen  Reactions   Bee Venom Anaphylaxis    Cannot take epi for this allergy d/t heart condition per pt   Latex Rash    Family History  Problem Relation Age of Onset   Cancer Mother    Leukemia Mother    Multiple myeloma Mother    Cancer Father        unsure what kind   Cancer - Colon Neg Hx    Colon polyps Neg Hx     Prior to Admission medications   Medication Sig Start Date End Date Taking? Authorizing Provider  acetaminophen  (TYLENOL ) 500 MG tablet Take 500 mg by mouth every 6 (six) hours as needed for moderate pain (pain score 4-6).   Yes [provider]  bismuth subsalicylate (PEPTO BISMOL) 262 MG/15ML suspension Take 30 mLs by mouth every 6 (six) hours as needed for diarrhea or loose stools.   Yes [provider]  diphenhydramine-acetaminophen  (TYLENOL  PM) 25-500 MG TABS tablet Take 1 tablet by mouth at bedtime as needed (pain/sleep).   Yes [provider]  folic acid  (FOLVITE ) 1 MG tablet Take 1 tablet (1 mg total) by mouth daily. 03/29/23  Yes Zarwolo, Gloria, FNP  gabapentin  (NEURONTIN ) 100 MG capsule Take 100 mg by mouth daily as needed (nerve pain).   Yes [provider]  meloxicam  (MOBIC ) 15 MG tablet Take 1 tablet (15 mg total) by mouth daily. Patient taking differently: Take 15 mg by mouth daily as needed for pain. 03/29/23  Yes Zarwolo, Gloria,  FNP  omeprazole  (PRILOSEC) 40 MG capsule Take 1 capsule (40 mg total) by mouth daily. 04/11/23  Yes Mahon, Martine Sleek, NP  ondansetron  (ZOFRAN ) 4 MG tablet Take 1 tablet (4 mg total) by mouth 2 (two) times daily as needed for nausea or vomiting. 04/23/23  Yes Alison Irvine, FNP  pyridOXINE (VITAMIN B6) 50 MG tablet Take 1 tablet (50 mg total) by mouth daily. 03/29/23  Yes Zarwolo, Gloria, FNP  Vitamin D , Ergocalciferol , (DRISDOL ) 1.25 MG (50000 UNIT) CAPS capsule Take 1 capsule (50,000 Units total) by mouth every 7 (seven) days. 04/15/23  Yes Zarwolo, Gloria, FNP    Physical Exam: Vitals:   05/15/23 1021  05/15/23 1022 05/15/23 1559 05/15/23 1943  BP: 112/79  119/85 96/63  Pulse: (!) 114  99 98  Resp: 18  18 16   Temp: 98.4 F (36.9 C)  98.4 F (36.9 C) 98.7 F (37.1 C)  TempSrc: Oral  Oral Oral  SpO2: 99%  100% 97%  Weight:  56.2 kg 52.9 kg   Height:  5\' 2"  (1.575 m)      Physical Exam  Gen:- Awake Alert, in no acute distress  HEENT:- [place].AT, No sclera icterus Neck-Supple Neck,No JVD,.  Lungs-  CTAB , fair air movement bilaterally  CV- S1, S2 normal, RRR Abd-  +ve B.Sounds, Abd Soft, epigastric tenderness without rebound or guarding    Extremity/Skin:- No  edema,   good pedal pulses  Psych-affect is appropriate, oriented x3 Neuro-no new focal deficits, no tremors MSK-swelling along the dorsal area of the forefoot  Data Reviewed: - WBC 5.6 hemoglobin 9.9, platelets 126 - Blood alcohol 46, down from 132 about 3 days ago - lipase is down to 49 from 91 3 days ago -Sodium is low at 120 potassium 3.5 chloride 85 - Creatinine 0.41 - AST 116 ALT 52 T. bili 1.5 - Right foot x-rays show--1. Transverse fractures of the proximal metaphyses of the second, third, and likely fourth metatarsals. These remain nondisplaced.  Assessment and Plan: 1)Persistent Hyponatremia--sodium is 120, no neurosymptoms On-going EtOH abuse  -Beer Potomania- -anesthesia and GI team once sodium to improve before they will do EGD so we will get Nephrology consult requested from Dr. Irene Mannheim he will see patient on 05/16/2023 -- iv NS  in the meantime  2)Acute anemia--suspect due to blood loss,  -CT abdomen and pelvis from 05/11/2023 showed-  Wall thickening of gastric antrum is noted suggesting possible inflammation or peptic ulcer disease. -Hemoglobin was 13.2 on 04/11/2023 -Hemoglobin was down to 8.5 on 05/14/2023 -Hgb back up to 9.9 at this time -Patient with history of NSAID use and alcohol abuse, as well as concerns for alcoholic liver cirrhosis -GI consult appreciated -Protonix  as ordered -GI and  anesthesia would like sodium to improve prior to doing EGD  3)Alcoholic liver cirrhosis--LFTs noted- AST 116 ALT 52 T. bili 1.5 -CT abdomen and pelvis from 05/11/2023 suggest liver cirrhosis with portal hypertension  4)Right foot x-rays show--1. Transverse fractures of the proximal metaphyses of the second, third, and likely fourth metatarsals. These remain nondisplaced. - Supportive care - Patient removed her splint by herself - Outpatient follow-up with Ortho  5) alcohol abuse--- patient drinks 6-12 beers per day, she is trying to cut back -- Blood alcohol 46, down from 132 about 3 days ago - Plan per CIWA protocol - Thiamine  folic acid  and multi-vitamins ordered  6) tobacco abuse--- Smoking cessation counseling for 4 minutes today,  -Give nicotine patch I have discussed tobacco cessation with the patient.  I have counseled the patient regarding the negative impacts of continued tobacco use including but not limited to lung cancer, COPD, and cardiovascular disease.  I have discussed alternatives to tobacco and modalities that may help facilitate tobacco cessation including but not limited to biofeedback, hypnosis, and medications.  Total time spent with tobacco counseling was 4 minutes.    Advance Care Planning:   Code Status: Full Code   Consults: Gi/Nephrology  Family Communication: Na  Severity of Illness: The appropriate patient status for this patient is OBSERVATION. Observation status is judged to be reasonable and necessary in order to provide the required intensity of service to ensure the patient's safety. The patient's presenting symptoms, physical exam findings, and initial radiographic and laboratory data in the context of their medical condition is felt to place them at decreased risk for further clinical deterioration. Furthermore, it is anticipated that the patient will be medically stable for discharge from the hospital within 2 midnights of admission.    Author: Colin Dawley, MD 05/15/2023 8:14 PM  For on call review [email or web address].

## 2023-05-16 DIAGNOSIS — W19XXXD Unspecified fall, subsequent encounter: Secondary | ICD-10-CM | POA: Diagnosis present

## 2023-05-16 DIAGNOSIS — K766 Portal hypertension: Secondary | ICD-10-CM | POA: Diagnosis not present

## 2023-05-16 DIAGNOSIS — Z791 Long term (current) use of non-steroidal anti-inflammatories (NSAID): Secondary | ICD-10-CM | POA: Diagnosis not present

## 2023-05-16 DIAGNOSIS — F1010 Alcohol abuse, uncomplicated: Secondary | ICD-10-CM | POA: Diagnosis present

## 2023-05-16 DIAGNOSIS — K219 Gastro-esophageal reflux disease without esophagitis: Secondary | ICD-10-CM

## 2023-05-16 DIAGNOSIS — K3189 Other diseases of stomach and duodenum: Secondary | ICD-10-CM | POA: Diagnosis not present

## 2023-05-16 DIAGNOSIS — F1090 Alcohol use, unspecified, uncomplicated: Secondary | ICD-10-CM | POA: Diagnosis not present

## 2023-05-16 DIAGNOSIS — Z720 Tobacco use: Secondary | ICD-10-CM

## 2023-05-16 DIAGNOSIS — Y902 Blood alcohol level of 40-59 mg/100 ml: Secondary | ICD-10-CM | POA: Diagnosis present

## 2023-05-16 DIAGNOSIS — Z5329 Procedure and treatment not carried out because of patient's decision for other reasons: Secondary | ICD-10-CM | POA: Diagnosis not present

## 2023-05-16 DIAGNOSIS — D696 Thrombocytopenia, unspecified: Secondary | ICD-10-CM | POA: Diagnosis not present

## 2023-05-16 DIAGNOSIS — K254 Chronic or unspecified gastric ulcer with hemorrhage: Secondary | ICD-10-CM | POA: Diagnosis not present

## 2023-05-16 DIAGNOSIS — S92901A Unspecified fracture of right foot, initial encounter for closed fracture: Secondary | ICD-10-CM | POA: Diagnosis not present

## 2023-05-16 DIAGNOSIS — K7010 Alcoholic hepatitis without ascites: Secondary | ICD-10-CM | POA: Diagnosis present

## 2023-05-16 DIAGNOSIS — K7030 Alcoholic cirrhosis of liver without ascites: Secondary | ICD-10-CM | POA: Diagnosis present

## 2023-05-16 DIAGNOSIS — K2920 Alcoholic gastritis without bleeding: Secondary | ICD-10-CM | POA: Diagnosis present

## 2023-05-16 DIAGNOSIS — Z91030 Bee allergy status: Secondary | ICD-10-CM | POA: Diagnosis not present

## 2023-05-16 DIAGNOSIS — F17210 Nicotine dependence, cigarettes, uncomplicated: Secondary | ICD-10-CM | POA: Diagnosis not present

## 2023-05-16 DIAGNOSIS — R109 Unspecified abdominal pain: Secondary | ICD-10-CM | POA: Diagnosis not present

## 2023-05-16 DIAGNOSIS — D6959 Other secondary thrombocytopenia: Secondary | ICD-10-CM | POA: Diagnosis not present

## 2023-05-16 DIAGNOSIS — D649 Anemia, unspecified: Secondary | ICD-10-CM | POA: Diagnosis not present

## 2023-05-16 DIAGNOSIS — Z91040 Latex allergy status: Secondary | ICD-10-CM | POA: Diagnosis not present

## 2023-05-16 DIAGNOSIS — S92301D Fracture of unspecified metatarsal bone(s), right foot, subsequent encounter for fracture with routine healing: Secondary | ICD-10-CM | POA: Diagnosis not present

## 2023-05-16 DIAGNOSIS — D62 Acute posthemorrhagic anemia: Secondary | ICD-10-CM | POA: Diagnosis not present

## 2023-05-16 DIAGNOSIS — I10 Essential (primary) hypertension: Secondary | ICD-10-CM | POA: Diagnosis not present

## 2023-05-16 DIAGNOSIS — Z79899 Other long term (current) drug therapy: Secondary | ICD-10-CM | POA: Diagnosis not present

## 2023-05-16 DIAGNOSIS — Z806 Family history of leukemia: Secondary | ICD-10-CM | POA: Diagnosis not present

## 2023-05-16 DIAGNOSIS — K580 Irritable bowel syndrome with diarrhea: Secondary | ICD-10-CM | POA: Diagnosis not present

## 2023-05-16 DIAGNOSIS — K2100 Gastro-esophageal reflux disease with esophagitis, without bleeding: Secondary | ICD-10-CM | POA: Diagnosis not present

## 2023-05-16 DIAGNOSIS — E871 Hypo-osmolality and hyponatremia: Secondary | ICD-10-CM | POA: Diagnosis not present

## 2023-05-16 DIAGNOSIS — E876 Hypokalemia: Secondary | ICD-10-CM | POA: Diagnosis not present

## 2023-05-16 LAB — HEPATIC FUNCTION PANEL
ALT: 38 U/L (ref 0–44)
AST: 74 U/L — ABNORMAL HIGH (ref 15–41)
Albumin: 2.6 g/dL — ABNORMAL LOW (ref 3.5–5.0)
Alkaline Phosphatase: 70 U/L (ref 38–126)
Bilirubin, Direct: 0.6 mg/dL — ABNORMAL HIGH (ref 0.0–0.2)
Indirect Bilirubin: 0.4 mg/dL (ref 0.3–0.9)
Total Bilirubin: 1 mg/dL (ref 0.0–1.2)
Total Protein: 6.1 g/dL — ABNORMAL LOW (ref 6.5–8.1)

## 2023-05-16 LAB — BASIC METABOLIC PANEL WITH GFR
Anion gap: 8 (ref 5–15)
BUN: 5 mg/dL — ABNORMAL LOW (ref 6–20)
CO2: 25 mmol/L (ref 22–32)
Calcium: 7.8 mg/dL — ABNORMAL LOW (ref 8.9–10.3)
Chloride: 93 mmol/L — ABNORMAL LOW (ref 98–111)
Creatinine, Ser: 0.51 mg/dL (ref 0.44–1.00)
GFR, Estimated: >60 mL/min (ref >60)
Glucose, Bld: 150 mg/dL — ABNORMAL HIGH (ref 70–99)
Potassium: 2.8 mmol/L — ABNORMAL LOW (ref 3.5–5.1)
Sodium: 126 mmol/L — ABNORMAL LOW (ref 135–145)

## 2023-05-16 LAB — CULTURE, BLOOD (ROUTINE X 2)
Culture: NO GROWTH
Culture: NO GROWTH
Special Requests: ADEQUATE
Special Requests: ADEQUATE

## 2023-05-16 LAB — MAGNESIUM: Magnesium: 1.5 mg/dL — ABNORMAL LOW (ref 1.7–2.4)

## 2023-05-16 LAB — OSMOLALITY: Osmolality: 269 mosm/kg — ABNORMAL LOW (ref 275–295)

## 2023-05-16 LAB — SODIUM
Sodium: 126 mmol/L — ABNORMAL LOW (ref 135–145)
Sodium: 127 mmol/L — ABNORMAL LOW (ref 135–145)
Sodium: 125 mmol/L — ABNORMAL LOW (ref 135–145)

## 2023-05-16 LAB — PROTIME-INR
INR: 1.6 — ABNORMAL HIGH (ref 0.8–1.2)
Prothrombin Time: 19.3 s — ABNORMAL HIGH (ref 11.4–15.2)

## 2023-05-16 LAB — OSMOLALITY, URINE: Osmolality, Ur: 161 mosm/kg — ABNORMAL LOW (ref 300–900)

## 2023-05-16 LAB — SODIUM, URINE, RANDOM: Sodium, Ur: 25 mmol/L

## 2023-05-16 MED ORDER — CHLORDIAZEPOXIDE HCL 5 MG PO CAPS
25.0000 mg | ORAL_CAPSULE | Freq: Three times a day (TID) | ORAL | Status: DC
  Administered 2023-05-16: 25 mg via ORAL
  Filled 2023-05-16 (×5): qty 5

## 2023-05-16 MED ORDER — SODIUM CHLORIDE 0.9 % IV SOLN: INTRAVENOUS | Status: AC

## 2023-05-16 MED ORDER — ALUM & MAG HYDROXIDE-SIMETH 200-200-20 MG/5ML PO SUSP
30.0000 mL | Freq: Four times a day (QID) | ORAL | Status: DC | PRN
  Administered 2023-05-16: 30 mL via ORAL
  Filled 2023-05-16: qty 30

## 2023-05-16 MED ORDER — FLUTICASONE PROPIONATE 50 MCG/ACT NA SUSP
2.0000 | Freq: Every day | NASAL | Status: DC
  Administered 2023-05-16 – 2023-05-17 (×2): 2 via NASAL
  Filled 2023-05-16: qty 16

## 2023-05-16 MED ORDER — POTASSIUM CHLORIDE 20 MEQ PO PACK
40.0000 meq | PACK | Freq: Every day | ORAL | Status: DC
Start: 2023-05-16 — End: 2023-05-17
  Administered 2023-05-16: 40 meq via ORAL
  Filled 2023-05-16 (×2): qty 2

## 2023-05-16 MED ORDER — CALCIUM GLUCONATE-NACL 1-0.675 GM/50ML-% IV SOLN
1.0000 g | Freq: Once | INTRAVENOUS | Status: AC
  Administered 2023-05-16: 1000 mg via INTRAVENOUS
  Filled 2023-05-16: qty 50

## 2023-05-16 NOTE — H&P (View-Only) (Signed)
 Subjective: States not feeling great. Having some ongoing abdominal pain and nausea. Also with some ongoing rib pain after a fall secondary to syncopal episode (CT confirmed no fractures).  She is hungry but states she does not feel she can eat due to GI symptoms. She notes eating a few bites and feeling more nauseated and with early satiety. No BMs this morning, notes no BMs in about 1 week.   Objective: Vital signs in last 24 hours: Temp:  [98 F (36.7 C)-98.7 F (37.1 C)] 98 F (36.7 C) (05/07 0810) Pulse Rate:  [78-114] 83 (05/07 0810) Resp:  [16-18] 16 (05/07 0355) BP: (92-119)/(60-85) 92/67 (05/07 0810) SpO2:  [97 %-100 %] 100 % (05/07 0810) Weight:  [52.9 kg-56.2 kg] 52.9 kg (05/06 1559)   General:   Alert and oriented, pleasant Head:  Normocephalic and atraumatic. Eyes:  No icterus, sclera clear. Conjuctiva pink.  Mouth:  Without lesions, mucosa pink and moist.  Heart:  S1, S2 present, no murmurs noted.  Lungs: Clear to auscultation bilaterally, without wheezing, rales, or rhonchi.  Abdomen:  Bowel sounds present, soft, non-tender, non-distended. No HSM or hernias noted. No rebound or guarding. No masses appreciated  Extremities:  Without clubbing or edema. Neurologic:  Alert and  oriented x4;  grossly normal neurologically. No asterixis  Skin:  Warm and dry, intact without significant lesions.  Psych:  Alert and cooperative. Normal mood and affect.  Intake/Output from previous day: 05/06 0701 - 05/07 0700 In: 338.7 [P.O.:240; IV Piggyback:98.7] Out: -  Intake/Output this shift: No intake/output data recorded.  Lab Results: Recent Labs    05/14/23 0454 05/15/23 1248  WBC 3.9* 5.6  HGB 8.5* 9.9*  HCT 25.4* 27.6*  PLT 91* 126*   BMET Recent Labs    05/14/23 0454 05/14/23 0856 05/15/23 1248  NA 118* 122* 120*  K 3.9  --  3.5  CL 87*  --  85*  CO2 24  --  22  GLUCOSE 102*  --  82  BUN <5*  --  <5*  CREATININE 0.35*  --  0.41*  CALCIUM 8.3*  --  8.7*    LFT Recent Labs    05/14/23 0454 05/15/23 1248  PROT 6.5 7.2  ALBUMIN 2.8* 3.1*  AST 123* 116*  ALT 44 52*  ALKPHOS 64 75  BILITOT 1.7* 1.5*   PT/INR Recent Labs    05/14/23 0454  LABPROT 15.8*  INR 1.2    Studies/Results: DG Foot Complete Right Result Date: 05/15/2023 CLINICAL DATA:  Prior fracture EXAM: RIGHT FOOT COMPLETE - 3+ VIEW COMPARISON:  04/15/2023 FINDINGS: Bony demineralization. Transverse fractures of the proximal metaphyses of the second, third, likely fourth metatarsals. These remain nondisplaced. No current malalignment at the Lisfranc joint. Dorsal soft tissue swelling along the forefoot. IMPRESSION: 1. Transverse fractures of the proximal metaphyses of the second, third, and likely fourth metatarsals. These remain nondisplaced. 2. Dorsal soft tissue swelling along the forefoot. 3. Bony demineralization. Electronically Signed   By: Freida Jes M.D.   On: 05/15/2023 12:47    Assessment: 53 y.o. female with a history of ETOH abuse, GERD, HTN, SVT, periumbilical hernia, possible alcoholic cirrhosis, IBS with diarrhea, with recent admitted to the hospital on 05/12/2023  with worsening abdominal pain, nausea, vomiting, melena.  GI was consulted for evaluation of possible upper GI bleeding.   found to have features on imaging concerning for cirrhosis as well as gastric wall thickening. She was treated with ceftriaxone for SBP prophylaxis, octreotide drip, and IV  PPI twice daily with plans to proceed with endoscopy once severe hyponatremia had improved.  Unfortunately, patient left AMA Monday to take care of some things at home and stated she would be back to the hospital in the next 1 to 2 days. She returned to the hospital Tuesday.    Melena/anemia: New onset dark tarry stools a few weeks ago, last episode 5/4.  Denies NSAID use or anticoagulants.  Hemoglobin 10.8 on 5/2 down from 12-13 range.  Hemoglobin declined as low as 8.3 on 5/4.  Back up to 9.9 yesterday, has  not been checked today.  CT showed evidence of cirrhosis, less likely this is from variceal bleeding as she has not been hemodynamically unstable.  Some gastric wall thickening as well, bleeding likely secondary to PUD.  PPI twice daily was resumed, will need to resume Rocephin for SBP prophylaxis and octreotide if overt GI bleeding recurred.  Planning for EGD once hyponatremia is improved.  Hyponatremia/hypokalemia/hypocalcemia: hyponatremia In setting of nausea, vomiting, diarrhea, chronic alcohol abuse.  Sodium 126 today but potassium now low at 2.8 and calcium 7.8. repletion per hospitalist.  re evaluation in the am for possible EGD tomorrow pending electrolytes are stable.   Nausea/vomiting: Etiology unclear.  Could be related to underlying PUD as CT shows evidence of gastric wall thickening.  PPI twice daily resumed, EGD once electrolytes stable, hopefully tomorrow.   Previous Diarrhea: Recurrent episodes of diarrhea multiple bowel movements x 1 month.  TSH normal limits.  Celiac panel negative.  Could be secondary to IBS. Reports last BM about 1 week ago. as diarrhea has resolved can complete stool testing if diarrhea returns to rule out infectious etiology.  Cirrhosis: Likely secondary to EtOH abuse.  Clinically remaining well compensated. A/ox4.  Updated MELD 3.0 to be calculated once INR and HFP updated. needs complete alcohol cessation.   Plan: MELD labs daily IV PPI BID Antiemetics as needed EGD once electrolytes repleted (Na needs to be around 130, K+ around 3+, calcium atleast 8) NPO at midnight for possible EGD tomorrow  Montior h&H, trend for overt GI bleeding Resume SBP prophylaxis with rocephin if GI bleeding recurs Stool studies if diarrhea recurs Complete ETOH cessation* Continue outpatient cirrhosis care    LOS: 0 days    05/16/2023, 9:17 AM   Tamiki Kuba L. Danaria Larsen, MSN, APRN, AGNP-C Adult-Gerontology Nurse Practitioner [facility]

## 2023-05-16 NOTE — Plan of Care (Signed)
 Pt complained of constant back pain stating he will not be able to make it through the night if she doe not receive some type of relief.  Contacted on call physician. New orders given for prn pain meds : oxy and tylenol . Pt given Tyl, tolerated well. No longer complained of issues, pt resting. Call light given if further assistance was needed.

## 2023-05-16 NOTE — Progress Notes (Signed)
 Nurse at bedside,patient alert and oriented times four. Blood pressure 92/67,heart rate 83,Dr Traxler notified.Patient c/o pain to left rib cage rated a 10,achy and constant. Plan of care on going .

## 2023-05-16 NOTE — Consult Note (Signed)
 Reason for Consult:Acute on chronic hyponatremia Referring Physician: Lincoln Renshaw, MD  Joan Wood is an 53 y.o. female with a PMH significant for HTN, GERD, anxiety, h/o SVT, and ongoing alcohol abuse with radiologic evidence of cirrhosis who presented to [facility] ED on 05/11/23 with a 3 day history of abdominal pain, nausea, vomiting, and diarrhea.  In the ED, she was febrile at 99.8, Bp 110/79, HR 100, SpO2 98%.  Labs were notable for Na 123, K 2.9, Cl 84, AST 99, tbili 2.2.  She was to be admitted but left AMA.  She returned on 05/12/23 after having a syncopal event and complained of back pain and left sided rib pain.  She continues to have N/V/D and had dark stools that morning.  GI was consulted for EGD.  Her sodium remained low at 118, alcohol level 132.  She was given IVF's and potassium supplements with some improvement but she again left AMA on 05/14/23.  She returned to the ED on 05/15/23 for readmission.  She did drink beer prior to coming in and normally drinks 5-7 beers/day.  Repeat labs were notable for Hgb 9.9, Na 120, K 3.5, Cl 85, BUN <5, alb 3.1, AST 116, ALT 52, t.bili 1.5.  She was admitted again for further workup of hyponatremia and GIB.  We were consulted due to persistent hyponatremia.  Her initial workup on 05/12/23 Sosm 271, Uosm 351, UNa 74.  She reports no vomiting or diarrhea for the past 2 days and has had improvement of her nausea.  She continues to complain of abdominal pain.   Trend in Serum Sodium: Sodium  Date/Time Value Ref Range Status  05/15/2023 12:48 PM 120 (L) 135 - 145 mmol/L Final  05/14/2023 08:56 AM 122 (L) 135 - 145 mmol/L Final  05/14/2023 04:54 AM 118 (LL) 135 - 145 mmol/L Final  05/13/2023 07:54 AM 125 (L) 135 - 145 mmol/L Final  05/13/2023 02:22 AM 123 (L) 135 - 145 mmol/L Final  05/12/2023 07:56 PM 124 (L) 135 - 145 mmol/L Final  05/12/2023 02:21 PM 124 (L) 135 - 145 mmol/L Final  05/12/2023 09:40 AM 118 (LL) 135 - 145 mmol/L Final  05/11/2023 03:12 PM 123 (L)  135 - 145 mmol/L Final  04/11/2023 02:55 PM 133 (L) 134 - 144 mmol/L Final  04/03/2023 10:41 AM 131 (L) 134 - 144 mmol/L Final  03/28/2022 09:36 AM 133 (L) 134 - 144 mmol/L Final  08/14/2020 12:19 PM 123 (L) 135 - 145 mmol/L Final    PMH:   Past Medical History:  Diagnosis Date   Anxiety    GERD (gastroesophageal reflux disease)    Hernia of abdominal wall    HTN (hypertension)    SVT (supraventricular tachycardia) (HCC)     PSH:   Past Surgical History:  Procedure Laterality Date   FINGER FRACTURE SURGERY     right ankle repair     age 51    Allergies:  Allergies  Allergen Reactions   Bee Venom Anaphylaxis    Cannot take epi for this allergy d/t heart condition per pt   Latex Rash    Medications:   Prior to Admission medications   Medication Sig Start Date End Date Taking? Authorizing Provider  acetaminophen  (TYLENOL ) 500 MG tablet Take 500 mg by mouth every 6 (six) hours as needed for moderate pain (pain score 4-6).   Yes [provider]  bismuth subsalicylate (PEPTO BISMOL) 262 MG/15ML suspension Take 30 mLs by mouth every 6 (six) hours as needed  for diarrhea or loose stools.   Yes [provider]  diphenhydramine-acetaminophen  (TYLENOL  PM) 25-500 MG TABS tablet Take 1 tablet by mouth at bedtime as needed (pain/sleep).   Yes [provider]  folic acid  (FOLVITE ) 1 MG tablet Take 1 tablet (1 mg total) by mouth daily. 03/29/23  Yes Zarwolo, Gloria, FNP  gabapentin  (NEURONTIN ) 100 MG capsule Take 100 mg by mouth daily as needed (nerve pain).   Yes [provider]  meloxicam  (MOBIC ) 15 MG tablet Take 1 tablet (15 mg total) by mouth daily. Patient taking differently: Take 15 mg by mouth daily as needed for pain. 03/29/23  Yes Zarwolo, Gloria, FNP  omeprazole  (PRILOSEC) 40 MG capsule Take 1 capsule (40 mg total) by mouth daily. 04/11/23  Yes Mahon, Martine Sleek, NP  ondansetron  (ZOFRAN ) 4 MG tablet Take 1 tablet (4 mg total) by mouth 2 (two)  times daily as needed for nausea or vomiting. 04/23/23  Yes Alison Irvine, FNP  pyridOXINE (VITAMIN B6) 50 MG tablet Take 1 tablet (50 mg total) by mouth daily. 03/29/23  Yes Zarwolo, Gloria, FNP  Vitamin D , Ergocalciferol , (DRISDOL ) 1.25 MG (50000 UNIT) CAPS capsule Take 1 capsule (50,000 Units total) by mouth every 7 (seven) days. 04/15/23  Yes Zarwolo, Gloria, FNP    Discontinued Meds:   Medications Discontinued During This Encounter  Medication Reason   loperamide  (IMODIUM  A-D) 2 MG tablet Change in therapy    Social History:  reports that she has been smoking cigarettes. She started smoking about 14 months ago. She has a 28.3 pack-year smoking history. She has never used smokeless tobacco. She reports current alcohol use. She reports that she does not use drugs.  Family History:   Family History  Problem Relation Age of Onset   Cancer Mother    Leukemia Mother    Multiple myeloma Mother    Cancer Father        unsure what kind   Cancer - Colon Neg Hx    Colon polyps Neg Hx     Pertinent items are noted in HPI.  Blood pressure 92/67, pulse 83, temperature 98 F (36.7 C), temperature source Oral, resp. rate 16, height 5\' 2"  (1.575 m), weight 52.9 kg, SpO2 100%. General appearance: alert, cooperative, and no distress Head: Normocephalic, without obvious abnormality, atraumatic Eyes: negative findings: lids and lashes normal, conjunctivae and sclerae normal, and corneas clear Resp: clear to auscultation bilaterally Cardio: regular rate and rhythm, S1, S2 normal, no murmur, click, rub or gallop GI: +BS, soft, + tenderness Extremities: extremities normal, atraumatic, no cyanosis or edema  Labs: Basic Metabolic Panel: Recent Labs  Lab 05/11/23 1512 05/12/23 0940 05/12/23 1421 05/12/23 1956 05/13/23 0222 05/13/23 0754 05/14/23 0454 05/14/23 0856 05/15/23 1248  NA 123* 118* 124* 124* 123* 125* 118* 122* 120*  K 2.9* 2.8*  --   --  4.4  --  3.9  --  3.5  CL 84* 83*  --    --  95*  --  87*  --  85*  CO2 24 22  --   --  21*  --  24  --  22  GLUCOSE 97 91  --   --  104*  --  102*  --  82  BUN 23* 10  --   --  7  --  <5*  --  <5*  CREATININE 0.61 0.43*  --   --  0.39*  --  0.35*  --  0.41*  ALBUMIN 3.5 3.3*  --   --  2.6*  --  2.8*  --  3.1*  CALCIUM 9.0 8.5*  --   --  7.7*  --  8.3*  --  8.7*   Liver Function Tests: Recent Labs  Lab 05/13/23 0222 05/14/23 0454 05/15/23 1248  AST 121* 123* 116*  ALT 40 44 52*  ALKPHOS 58 64 75  BILITOT 2.0* 1.7* 1.5*  PROT 6.1* 6.5 7.2  ALBUMIN 2.6* 2.8* 3.1*   Recent Labs  Lab 05/11/23 1512 05/12/23 1421 05/15/23 1154  LIPASE 73* 91* 49   No results for input(s): "AMMONIA" in the last 168 hours. CBC: Recent Labs  Lab 05/11/23 1512 05/12/23 0940 05/13/23 0222 05/14/23 0454 05/15/23 1248  WBC 6.0 4.6 3.3* 3.9* 5.6  NEUTROABS 4.0 3.4  --   --  3.7  HGB 10.8* 9.5* 8.3* 8.5* 9.9*  HCT 30.1* 26.5* 24.1* 25.4* 27.6*  MCV 100.0 99.6 103.4* 102.0* 100.4*  PLT 100* 91* PLATELETS APPEAR DECREASED 91* 126*   PT/INR: @labrcntip (inr:5) Cardiac Enzymes: No results for input(s): "CKTOTAL", "CKMB", "CKMBINDEX", "TROPONINI" in the last 168 hours. CBG: No results for input(s): "GLUCAP" in the last 168 hours.  Iron Studies:  Recent Labs  Lab 05/12/23 0930  IRON 119  TIBC 317  FERRITIN 266    Xrays/Other Studies: DG Foot Complete Right Result Date: 05/15/2023 CLINICAL DATA:  Prior fracture EXAM: RIGHT FOOT COMPLETE - 3+ VIEW COMPARISON:  04/15/2023 FINDINGS: Bony demineralization. Transverse fractures of the proximal metaphyses of the second, third, likely fourth metatarsals. These remain nondisplaced. No current malalignment at the Lisfranc joint. Dorsal soft tissue swelling along the forefoot. IMPRESSION: 1. Transverse fractures of the proximal metaphyses of the second, third, and likely fourth metatarsals. These remain nondisplaced. 2. Dorsal soft tissue swelling along the forefoot. 3. Bony demineralization.  Electronically Signed   By: Freida Jes M.D.   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  Her initial labs were more suggestive of SIADH with Sosm 271, Uosm 351, UNa 74 but also had alcohol in her blood at 132.  Sodium level initially improved with IVF's but she left AMA and then dropped again.  She is currently receiving normal saline @ 100 mL/hr.  Will need to recheck Uosm, UNa, and Sosm today, as well as follow serum sodium levels closely.  She is currently asymptomatic. ABLA - melena and CT with some gastritis.  Concerning for PUD and GI following for EGD when sodium level improves. Abdominal pain - likely due to gastritis from Etoh abuse. Alcoholic hepatitis - elevated AST and ALT.  Ct suggests cirrhosis with portal hypertension.  GI following.  Alcohol abuse - pt reports that she has been trying to cut back.  CIWA per primary.  Thiamin and folic acid  supplementation. Hypokalemia - continue to replace and follow.  N/V/D - improved. Thrombocytopenia - presumably due to cirrhosis.   Fletcher Humble Rasha Ibe 05/16/2023, 8:47 AM

## 2023-05-16 NOTE — Plan of Care (Signed)
  Problem: Clinical Measurements: Goal: Ability to maintain clinical measurements within normal limits will improve Outcome: Progressing Goal: Will remain free from infection Outcome: Progressing   Problem: Coping: Goal: Level of anxiety will decrease Outcome: Progressing   Problem: Elimination: Goal: Will not experience complications related to bowel motility Outcome: Progressing Goal: Will not experience complications related to urinary retention Outcome: Progressing   Problem: Pain Managment: Goal: General experience of comfort will improve and/or be controlled Outcome: Progressing   Problem: Safety: Goal: Ability to remain free from injury will improve Outcome: Progressing

## 2023-05-16 NOTE — Progress Notes (Signed)
 Mobility Specialist Progress Note:    05/16/23 1040  Mobility  Activity Stood at bedside;Transferred from bed to chair  Level of Assistance Minimal assist, patient does 75% or more  Assistive Device None (HHA)  Distance Ambulated (ft) 5 ft  Range of Motion/Exercises Active;All extremities  Activity Response Tolerated well  Mobility Referral Yes  Mobility visit 1 Mobility  Mobility Specialist Start Time (ACUTE ONLY) 1020  Mobility Specialist Stop Time (ACUTE ONLY) 1040  Mobility Specialist Time Calculation (min) (ACUTE ONLY) 20 min   Pt received with NT, agreeable to mobility. Required MinA to stand and transfer with 1 person hand-held assist. Left pt in chair, call bell in hand. Nurse in room, all needs met.  Glinda Lapping Mobility Specialist Please contact via Special educational needs teacher or  Rehab office at [phone]

## 2023-05-16 NOTE — Progress Notes (Signed)
 Subjective: States not feeling great. Having some ongoing abdominal pain and nausea. Also with some ongoing rib pain after a fall secondary to syncopal episode (CT confirmed no fractures).  She is hungry but states she does not feel she can eat due to GI symptoms. She notes eating a few bites and feeling more nauseated and with early satiety. No BMs this morning, notes no BMs in about 1 week.   Objective: Vital signs in last 24 hours: Temp:  [98 F (36.7 C)-98.7 F (37.1 C)] 98 F (36.7 C) (05/07 0810) Pulse Rate:  [78-114] 83 (05/07 0810) Resp:  [16-18] 16 (05/07 0355) BP: (92-119)/(60-85) 92/67 (05/07 0810) SpO2:  [97 %-100 %] 100 % (05/07 0810) Weight:  [52.9 kg-56.2 kg] 52.9 kg (05/06 1559)   General:   Alert and oriented, pleasant Head:  Normocephalic and atraumatic. Eyes:  No icterus, sclera clear. Conjuctiva pink.  Mouth:  Without lesions, mucosa pink and moist.  Heart:  S1, S2 present, no murmurs noted.  Lungs: Clear to auscultation bilaterally, without wheezing, rales, or rhonchi.  Abdomen:  Bowel sounds present, soft, non-tender, non-distended. No HSM or hernias noted. No rebound or guarding. No masses appreciated  Extremities:  Without clubbing or edema. Neurologic:  Alert and  oriented x4;  grossly normal neurologically. No asterixis  Skin:  Warm and dry, intact without significant lesions.  Psych:  Alert and cooperative. Normal mood and affect.  Intake/Output from previous day: 05/06 0701 - 05/07 0700 In: 338.7 [P.O.:240; IV Piggyback:98.7] Out: -  Intake/Output this shift: No intake/output data recorded.  Lab Results: Recent Labs    05/14/23 0454 05/15/23 1248  WBC 3.9* 5.6  HGB 8.5* 9.9*  HCT 25.4* 27.6*  PLT 91* 126*   BMET Recent Labs    05/14/23 0454 05/14/23 0856 05/15/23 1248  NA 118* 122* 120*  K 3.9  --  3.5  CL 87*  --  85*  CO2 24  --  22  GLUCOSE 102*  --  82  BUN <5*  --  <5*  CREATININE 0.35*  --  0.41*  CALCIUM 8.3*  --  8.7*    LFT Recent Labs    05/14/23 0454 05/15/23 1248  PROT 6.5 7.2  ALBUMIN 2.8* 3.1*  AST 123* 116*  ALT 44 52*  ALKPHOS 64 75  BILITOT 1.7* 1.5*   PT/INR Recent Labs    05/14/23 0454  LABPROT 15.8*  INR 1.2    Studies/Results: DG Foot Complete Right Result Date: 05/15/2023 CLINICAL DATA:  Prior fracture EXAM: RIGHT FOOT COMPLETE - 3+ VIEW COMPARISON:  04/15/2023 FINDINGS: Bony demineralization. Transverse fractures of the proximal metaphyses of the second, third, likely fourth metatarsals. These remain nondisplaced. No current malalignment at the Lisfranc joint. Dorsal soft tissue swelling along the forefoot. IMPRESSION: 1. Transverse fractures of the proximal metaphyses of the second, third, and likely fourth metatarsals. These remain nondisplaced. 2. Dorsal soft tissue swelling along the forefoot. 3. Bony demineralization. Electronically Signed   By: Freida Jes M.D.   On: 05/15/2023 12:47    Assessment: 53 y.o. female with a history of ETOH abuse, GERD, HTN, SVT, periumbilical hernia, possible alcoholic cirrhosis, IBS with diarrhea, with recent admitted to the hospital on 05/12/2023  with worsening abdominal pain, nausea, vomiting, melena.  GI was consulted for evaluation of possible upper GI bleeding.   found to have features on imaging concerning for cirrhosis as well as gastric wall thickening. She was treated with ceftriaxone for SBP prophylaxis, octreotide drip, and IV  PPI twice daily with plans to proceed with endoscopy once severe hyponatremia had improved.  Unfortunately, patient left AMA Monday to take care of some things at home and stated she would be back to the hospital in the next 1 to 2 days. She returned to the hospital Tuesday.    Melena/anemia: New onset dark tarry stools a few weeks ago, last episode 5/4.  Denies NSAID use or anticoagulants.  Hemoglobin 10.8 on 5/2 down from 12-13 range.  Hemoglobin declined as low as 8.3 on 5/4.  Back up to 9.9 yesterday, has  not been checked today.  CT showed evidence of cirrhosis, less likely this is from variceal bleeding as she has not been hemodynamically unstable.  Some gastric wall thickening as well, bleeding likely secondary to PUD.  PPI twice daily was resumed, will need to resume Rocephin for SBP prophylaxis and octreotide if overt GI bleeding recurred.  Planning for EGD once hyponatremia is improved.  Hyponatremia/hypokalemia/hypocalcemia: hyponatremia In setting of nausea, vomiting, diarrhea, chronic alcohol abuse.  Sodium 126 today but potassium now low at 2.8 and calcium 7.8. repletion per hospitalist.  re evaluation in the am for possible EGD tomorrow pending electrolytes are stable.   Nausea/vomiting: Etiology unclear.  Could be related to underlying PUD as CT shows evidence of gastric wall thickening.  PPI twice daily resumed, EGD once electrolytes stable, hopefully tomorrow.   Previous Diarrhea: Recurrent episodes of diarrhea multiple bowel movements x 1 month.  TSH normal limits.  Celiac panel negative.  Could be secondary to IBS. Reports last BM about 1 week ago. as diarrhea has resolved can complete stool testing if diarrhea returns to rule out infectious etiology.  Cirrhosis: Likely secondary to EtOH abuse.  Clinically remaining well compensated. A/ox4.  Updated MELD 3.0 to be calculated once INR and HFP updated. needs complete alcohol cessation.   Plan: MELD labs daily IV PPI BID Antiemetics as needed EGD once electrolytes repleted (Na needs to be around 130, K+ around 3+, calcium atleast 8) NPO at midnight for possible EGD tomorrow  Montior h&H, trend for overt GI bleeding Resume SBP prophylaxis with rocephin if GI bleeding recurs Stool studies if diarrhea recurs Complete ETOH cessation* Continue outpatient cirrhosis care    LOS: 0 days    05/16/2023, 9:17 AM   Tamiki Kuba L. Danaria Larsen, MSN, APRN, AGNP-C Adult-Gerontology Nurse Practitioner [facility] at [facility]

## 2023-05-16 NOTE — Progress Notes (Signed)
 PROGRESS NOTE   Joan Wood  ZOX:[id number] DOB: 03/31/1970 DOA: 05/15/2023 PCP: Zarwolo, Gloria, FNP   Chief Complaint  Patient presents with   Abdominal Pain   Level of care: Telemetry  Brief Admission History:   53 y.o. female with medical history significant of history of ongoing alcohol abuse, GERD, HTN, SVT, periumbilical hernia, presumed alcoholic liver cirrhosis, IBS-D, anxiety disorder who was recently admitted to the hospital on 05/12/2023 after presenting with worsening abdominal pain, nausea, vomiting, melena.  Patient left AMA on 05/14/2023-despite being advised to wait for sodium to improve so she can have EGD - Went home and drank more alcohol - Returned to the ED on 05/15/2023--reports no BM since leaving AMA and admitted with worsening hyponatremia   Assessment and Plan:  Persistent Hyponatremia--sodium is 120, no neurosymptoms On-going EtOH abuse  -Beer Potomania -anesthesia and GI team requesting sodium to improve before they will do EGD so we will get Nephrology consult requested from Dr. Irene Mannheim he will see patient on 05/16/2023 -- slowly improving sodium up to 126 today -- continue to monitor daily  -- continue IV hydration -- check Mg, replace calcium    Acute anemia--suspect due to blood loss and hemodilution  -CT abdomen and pelvis from 05/11/2023 showed-  Wall thickening of gastric antrum is noted suggesting possible inflammation or peptic ulcer disease. -Hemoglobin was 13.2 on 04/11/2023 -Hemoglobin was down to 8.5 on 05/14/2023 -Hgb back up to 9.9 at this time -Patient with history of NSAID use and alcohol abuse, as well as concerns for alcoholic liver cirrhosis -GI consult appreciated -Protonix  as ordered -GI and anesthesia would like sodium to improve prior to doing EGD   Alcoholic liver cirrhosis--LFTs noted- AST 116 ALT 52 T. bili 1.5 -CT abdomen and pelvis from 05/11/2023 suggest liver cirrhosis with portal hypertension   Right foot x-rays show--1.  Transverse fractures of the proximal metaphyses of the second, third, and likely fourth metatarsals. These remain nondisplaced.  - Supportive care - Patient removed her splint by herself - Outpatient follow-up with Ortho   Chronic daily alcohol abuse--- patient drinks 6-12 beers per day, she is trying to cut back --added librium 25 mg TID to try to avoid acute alcohol withdrawal  --Plan per CIWA protocol --Thiamine , folic acid  and multi-vitamins ordered   6) tobacco abuse--- --continue nicotine patch --ongoing tobacco cessation counseling     DVT prophylaxis:  SCDs Code Status: Full  Family Communication:  Disposition: Status is: Inpatient   Consultants:  GI inpatient  Procedures:  Tentative plan for EGD  Antimicrobials:    Subjective: Pt reports overall weakness, difficulty sleeping.    Objective: Vitals:   05/15/23 2320 05/16/23 0355 05/16/23 0810 05/16/23 1232  BP: 107/68 98/60 92/67  106/77  Pulse: 78 87 83 87  Resp: 16 16  17   Temp: 98.5 F (36.9 C) 98.7 F (37.1 C) 98 F (36.7 C)   TempSrc: Oral Oral Oral   SpO2: 99% 99% 100% 98%  Weight:      Height:        Intake/Output Summary (Last 24 hours) at 05/16/2023 1317 Last data filed at 05/16/2023 0506 Gross per 24 hour  Intake 240 ml  Output --  Net 240 ml   Filed Weights   05/15/23 1022 05/15/23 1559  Weight: 56.2 kg 52.9 kg   Examination:  General exam: Appears calm and comfortable  Respiratory system: Clear to auscultation. Respiratory effort normal. Cardiovascular system: normal S1 & S2 heard. No JVD, murmurs, rubs, gallops or  clicks. No pedal edema. Gastrointestinal system: Abdomen is nondistended, soft and nontender. No organomegaly or masses felt. Normal bowel sounds heard. Central nervous system: Alert and oriented. No focal neurological deficits. Extremities: Symmetric 5 x 5 power. Skin: No rashes, lesions or ulcers. Psychiatry: Judgement and insight appear normal. Mood & affect appropriate.    Data Reviewed: I have personally reviewed following labs and imaging studies  CBC: Recent Labs  Lab 05/11/23 1512 05/12/23 0940 05/13/23 0222 05/14/23 0454 05/15/23 1248  WBC 6.0 4.6 3.3* 3.9* 5.6  NEUTROABS 4.0 3.4  --   --  3.7  HGB 10.8* 9.5* 8.3* 8.5* 9.9*  HCT 30.1* 26.5* 24.1* 25.4* 27.6*  MCV 100.0 99.6 103.4* 102.0* 100.4*  PLT 100* 91* PLATELETS APPEAR DECREASED 91* 126*    Basic Metabolic Panel: Recent Labs  Lab 05/12/23 0940 05/12/23 1421 05/13/23 0222 05/13/23 0754 05/14/23 0454 05/14/23 0856 05/15/23 1248 05/16/23 0849  NA 118*   < > 123* 125* 118* 122* 120* 126*  K 2.8*  --  4.4  --  3.9  --  3.5 2.8*  CL 83*  --  95*  --  87*  --  85* 93*  CO2 22  --  21*  --  24  --  22 25  GLUCOSE 91  --  104*  --  102*  --  82 150*  BUN 10  --  7  --  <5*  --  <5* 5*  CREATININE 0.43*  --  0.39*  --  0.35*  --  0.41* 0.51  CALCIUM 8.5*  --  7.7*  --  8.3*  --  8.7* 7.8*  MG 2.0  --  1.7  --   --   --   --   --    < > = values in this interval not displayed.    CBG: No results for input(s): "GLUCAP" in the last 168 hours.  Recent Results (from the past 240 hours)  Blood culture (routine x 2)     Status: None   Collection Time: 05/11/23  3:09 PM   Specimen: BLOOD  Result Value Ref Range Status   Specimen Description BLOOD LEFT ANTECUBITAL  Final   Special Requests   Final    BOTTLES DRAWN AEROBIC AND ANAEROBIC Blood Culture adequate volume   Culture   Final    NO GROWTH 5 DAYS Performed at [facility], [address] [place], [name] [address]    Report Status 05/16/2023 FINAL  Final  Blood culture (routine x 2)     Status: None   Collection Time: 05/11/23  3:14 PM   Specimen: BLOOD  Result Value Ref Range Status   Specimen Description BLOOD RIGHT ANTECUBITAL  Final   Special Requests   Final    BOTTLES DRAWN AEROBIC AND ANAEROBIC Blood Culture adequate volume   Culture   Final    NO GROWTH 5 DAYS Performed at [facility], [address],  [place], [name] [address]    Report Status 05/16/2023 FINAL  Final  MRSA Next Gen by PCR, Nasal     Status: None   Collection Time: 05/12/23  2:14 PM   Specimen: Nasal Mucosa; Nasal Swab  Result Value Ref Range Status   MRSA by PCR Next Gen NOT DETECTED NOT DETECTED Final    Comment: (NOTE) The GeneXpert MRSA Assay (FDA approved for NASAL specimens only), is one component of a comprehensive MRSA colonization surveillance program. It is not intended to diagnose MRSA infection nor  to guide or monitor treatment for MRSA infections. Test performance is not FDA approved in patients less than 64 years old. Performed at [facility], [address], [place], [name] [address]      Radiology Studies: DG Foot Complete Right Result Date: 05/15/2023 CLINICAL DATA:  Prior fracture EXAM: RIGHT FOOT COMPLETE - 3+ VIEW COMPARISON:  04/15/2023 FINDINGS: Bony demineralization. Transverse fractures of the proximal metaphyses of the second, third, likely fourth metatarsals. These remain nondisplaced. No current malalignment at the Lisfranc joint. Dorsal soft tissue swelling along the forefoot. IMPRESSION: 1. Transverse fractures of the proximal metaphyses of the second, third, and likely fourth metatarsals. These remain nondisplaced. 2. Dorsal soft tissue swelling along the forefoot. 3. Bony demineralization. Electronically Signed   By: Freida Jes M.D.   On: 05/15/2023 12:47    Scheduled Meds:  chlordiazePOXIDE  25 mg Oral TID   folic acid   1 mg Oral Daily   multivitamin with minerals  1 tablet Oral Daily   nicotine  21 mg Transdermal Daily   pantoprazole  (PROTONIX ) IV  40 mg Intravenous Q12H   potassium chloride   40 mEq Oral Daily   sodium chloride  flush  3 mL Intravenous Q12H   sodium chloride  flush  3 mL Intravenous Q12H   thiamine   100 mg Oral Daily   Or   thiamine   100 mg Intravenous Daily   Continuous Infusions:  sodium chloride      sodium chloride  100 mL/hr at 05/16/23 1030   calcium  gluconate      LOS: 0 days   Time spent: 55 mins  Tameya Kuznia Lincoln Renshaw, MD How to contact the [facility] Attending or Consulting provider 7A - 7P or covering provider during after hours 7P -7A, for this patient?  Check the care team in [facility] and look for a) attending/consulting TRH provider listed and b) the TRH team listed Log into [email or web address] to find provider on call.  Locate the TRH provider you are looking for under [facility] and page to a number that you can be directly reached. If you still have difficulty reaching the provider, please page the [facility] (Director on Call) for the Hospitalists listed on amion for assistance.  05/16/2023, 1:17 PM

## 2023-05-16 NOTE — Hospital Course (Signed)
 53 y.o. female with medical history significant of history of ongoing alcohol abuse, GERD, HTN, SVT, periumbilical hernia, presumed alcoholic liver cirrhosis, IBS-D, anxiety disorder who was recently admitted to the hospital on 05/12/2023 after presenting with worsening abdominal pain, nausea, vomiting, melena.  Patient left AMA on 05/14/2023-despite being advised to wait for sodium to improve so she can have EGD - Went home and drank more alcohol - Returned to the ED on 05/15/2023--reports no BM since leaving AMA and admitted with worsening hyponatremia

## 2023-05-16 NOTE — Progress Notes (Signed)
 Nurse at bedside,patient c/o indigestion.Dr Lincoln Renshaw notified, Patient given Maalox suspension 30 ml's by mouth per MAR prn MD's orders. Plan of care on going.

## 2023-05-16 NOTE — Progress Notes (Signed)
 Patient place on Enteric Isolation precaution per Infection control policy,patient educated on Enteric Isolation and educational care notes given,patient verbalized understanding Plan of care on going.

## 2023-05-16 NOTE — Plan of Care (Signed)
  Problem: Clinical Measurements: Goal: Ability to maintain clinical measurements within normal limits will improve 05/16/2023 0636 by Almetta Jacquet, LPN Outcome: Progressing 05/16/2023 0627 by Almetta Jacquet, LPN Outcome: Progressing Goal: Will remain free from infection 05/16/2023 0636 by Almetta Jacquet, LPN Outcome: Progressing 05/16/2023 0627 by Almetta Jacquet, LPN Outcome: Progressing   Problem: Coping: Goal: Level of anxiety will decrease 05/16/2023 0636 by Almetta Jacquet, LPN Outcome: Progressing 05/16/2023 0627 by Almetta Jacquet, LPN Outcome: Progressing   Problem: Elimination: Goal: Will not experience complications related to urinary retention 05/16/2023 0636 by Almetta Jacquet, LPN Outcome: Progressing 05/16/2023 0627 by Almetta Jacquet, LPN Outcome: Progressing   Problem: Pain Managment: Goal: General experience of comfort will improve and/or be controlled 05/16/2023 0636 by Almetta Jacquet, LPN Outcome: Progressing 05/16/2023 0627 by Almetta Jacquet, LPN Outcome: Progressing   Problem: Safety: Goal: Ability to remain free from injury will improve 05/16/2023 0636 by Almetta Jacquet, LPN Outcome: Progressing 05/16/2023 0627 by Almetta Jacquet, LPN Outcome: Progressing

## 2023-05-17 ENCOUNTER — Encounter (HOSPITAL_COMMUNITY)
Admission: EM | Discharge status: Against Medical Advice | Payer: Self-pay | Source: Home / Self Care | Attending: Family Medicine

## 2023-05-17 ENCOUNTER — Encounter (HOSPITAL_COMMUNITY): Payer: Self-pay | Admitting: Family Medicine

## 2023-05-17 ENCOUNTER — Inpatient Hospital Stay (HOSPITAL_COMMUNITY): Admitting: Anesthesiology

## 2023-05-17 DIAGNOSIS — K2951 Unspecified chronic gastritis with bleeding: Secondary | ICD-10-CM | POA: Diagnosis not present

## 2023-05-17 DIAGNOSIS — K766 Portal hypertension: Secondary | ICD-10-CM | POA: Diagnosis not present

## 2023-05-17 DIAGNOSIS — E876 Hypokalemia: Secondary | ICD-10-CM | POA: Diagnosis not present

## 2023-05-17 DIAGNOSIS — E871 Hypo-osmolality and hyponatremia: Secondary | ICD-10-CM | POA: Diagnosis not present

## 2023-05-17 DIAGNOSIS — R109 Unspecified abdominal pain: Secondary | ICD-10-CM | POA: Diagnosis not present

## 2023-05-17 DIAGNOSIS — D696 Thrombocytopenia, unspecified: Secondary | ICD-10-CM | POA: Diagnosis not present

## 2023-05-17 DIAGNOSIS — K259 Gastric ulcer, unspecified as acute or chronic, without hemorrhage or perforation: Secondary | ICD-10-CM | POA: Diagnosis not present

## 2023-05-17 DIAGNOSIS — F419 Anxiety disorder, unspecified: Secondary | ICD-10-CM | POA: Diagnosis not present

## 2023-05-17 DIAGNOSIS — F1090 Alcohol use, unspecified, uncomplicated: Secondary | ICD-10-CM | POA: Diagnosis not present

## 2023-05-17 DIAGNOSIS — K2100 Gastro-esophageal reflux disease with esophagitis, without bleeding: Secondary | ICD-10-CM | POA: Diagnosis not present

## 2023-05-17 DIAGNOSIS — K219 Gastro-esophageal reflux disease without esophagitis: Secondary | ICD-10-CM | POA: Diagnosis not present

## 2023-05-17 DIAGNOSIS — D62 Acute posthemorrhagic anemia: Secondary | ICD-10-CM | POA: Diagnosis not present

## 2023-05-17 DIAGNOSIS — K580 Irritable bowel syndrome with diarrhea: Secondary | ICD-10-CM | POA: Diagnosis not present

## 2023-05-17 HISTORY — PX: ESOPHAGOGASTRODUODENOSCOPY: SHX5428

## 2023-05-17 LAB — CBC WITH DIFFERENTIAL/PLATELET
Abs Immature Granulocytes: 0.03 10*3/uL (ref 0.00–0.07)
Basophils Absolute: 0 10*3/uL (ref 0.0–0.1)
Basophils Relative: 1 %
Eosinophils Absolute: 0.1 10*3/uL (ref 0.0–0.5)
Eosinophils Relative: 2 %
HCT: 24.5 % — ABNORMAL LOW (ref 36.0–46.0)
Hemoglobin: 8.2 g/dL — ABNORMAL LOW (ref 12.0–15.0)
Immature Granulocytes: 1 %
Lymphocytes Relative: 29 %
Lymphs Abs: 0.9 10*3/uL (ref 0.7–4.0)
MCH: 34.7 pg — ABNORMAL HIGH (ref 26.0–34.0)
MCHC: 33.5 g/dL (ref 30.0–36.0)
MCV: 103.8 fL — ABNORMAL HIGH (ref 80.0–100.0)
Monocytes Absolute: 0.8 10*3/uL (ref 0.1–1.0)
Monocytes Relative: 25 %
Neutro Abs: 1.4 10*3/uL — ABNORMAL LOW (ref 1.7–7.7)
Neutrophils Relative %: 42 %
Platelets: 134 10*3/uL — ABNORMAL LOW (ref 150–400)
RBC: 2.36 MIL/uL — ABNORMAL LOW (ref 3.87–5.11)
RDW: 13.4 % (ref 11.5–15.5)
WBC: 3.2 10*3/uL — ABNORMAL LOW (ref 4.0–10.5)
nRBC: 0 % (ref 0.0–0.2)

## 2023-05-17 LAB — BASIC METABOLIC PANEL WITH GFR
Anion gap: 7 (ref 5–15)
BUN: <5 mg/dL — ABNORMAL LOW (ref 6–20)
CO2: 22 mmol/L (ref 22–32)
Calcium: 7.8 mg/dL — ABNORMAL LOW (ref 8.9–10.3)
Chloride: 104 mmol/L (ref 98–111)
Creatinine, Ser: 0.47 mg/dL (ref 0.44–1.00)
GFR, Estimated: >60 mL/min (ref >60)
Glucose, Bld: 100 mg/dL — ABNORMAL HIGH (ref 70–99)
Potassium: 3.5 mmol/L (ref 3.5–5.1)
Sodium: 133 mmol/L — ABNORMAL LOW (ref 135–145)

## 2023-05-17 LAB — MAGNESIUM: Magnesium: 1.6 mg/dL — ABNORMAL LOW (ref 1.7–2.4)

## 2023-05-17 LAB — HEPATIC FUNCTION PANEL
ALT: 33 U/L (ref 0–44)
AST: 62 U/L — ABNORMAL HIGH (ref 15–41)
Albumin: 2.4 g/dL — ABNORMAL LOW (ref 3.5–5.0)
Alkaline Phosphatase: 58 U/L (ref 38–126)
Bilirubin, Direct: 0.4 mg/dL — ABNORMAL HIGH (ref 0.0–0.2)
Indirect Bilirubin: 0.6 mg/dL (ref 0.3–0.9)
Total Bilirubin: 1 mg/dL (ref 0.0–1.2)
Total Protein: 5.5 g/dL — ABNORMAL LOW (ref 6.5–8.1)

## 2023-05-17 LAB — PROTIME-INR
INR: 1.6 — ABNORMAL HIGH (ref 0.8–1.2)
Prothrombin Time: 19.3 s — ABNORMAL HIGH (ref 11.4–15.2)

## 2023-05-17 LAB — SODIUM
Sodium: 127 mmol/L — ABNORMAL LOW (ref 135–145)
Sodium: 125 mmol/L — ABNORMAL LOW (ref 135–145)

## 2023-05-17 SURGERY — EGD (ESOPHAGOGASTRODUODENOSCOPY)
Anesthesia: General

## 2023-05-17 MED ORDER — SUCRALFATE 1 GM/10ML PO SUSP
1.0000 g | Freq: Three times a day (TID) | ORAL | Status: DC
  Administered 2023-05-17 – 2023-05-18 (×4): 1 g via ORAL
  Filled 2023-05-17 (×5): qty 10

## 2023-05-17 MED ORDER — LACTATED RINGERS IV SOLN: INTRAVENOUS | Status: DC | PRN

## 2023-05-17 MED ORDER — FENTANYL CITRATE (PF) 100 MCG/2ML IJ SOLN: 25.0000 ug | INTRAMUSCULAR | Status: DC | PRN

## 2023-05-17 MED ORDER — OXYCODONE HCL 5 MG PO TABS: 5.0000 mg | ORAL_TABLET | Freq: Once | ORAL | Status: DC | PRN

## 2023-05-17 MED ORDER — POTASSIUM CHLORIDE 20 MEQ PO PACK
40.0000 meq | PACK | Freq: Every day | ORAL | Status: DC
Start: 2023-05-17 — End: 2023-05-20
  Administered 2023-05-17 – 2023-05-18 (×2): 40 meq via ORAL
  Filled 2023-05-17: qty 2

## 2023-05-17 MED ORDER — LIDOCAINE 5 % EX PTCH
1.0000 | MEDICATED_PATCH | CUTANEOUS | Status: DC
  Administered 2023-05-17 – 2023-05-18 (×2): 1 via TRANSDERMAL
  Filled 2023-05-17 (×2): qty 1

## 2023-05-17 MED ORDER — SODIUM CHLORIDE 0.9 % IV SOLN: INTRAVENOUS | Status: DC

## 2023-05-17 MED ORDER — PROPOFOL 10 MG/ML IV BOLUS
INTRAVENOUS | Status: DC | PRN
  Administered 2023-05-17: 200 mg via INTRAVENOUS

## 2023-05-17 MED ORDER — ONDANSETRON HCL 4 MG/2ML IJ SOLN: 4.0000 mg | Freq: Once | INTRAMUSCULAR | Status: DC | PRN

## 2023-05-17 MED ORDER — POLYETHYLENE GLYCOL 3350 17 G PO PACK
17.0000 g | PACK | Freq: Three times a day (TID) | ORAL | Status: DC
  Administered 2023-05-17 – 2023-05-18 (×3): 17 g via ORAL
  Filled 2023-05-17 (×3): qty 1

## 2023-05-17 MED ORDER — MAGNESIUM SULFATE 4 GM/100ML IV SOLN
4.0000 g | Freq: Once | INTRAVENOUS | Status: AC
  Administered 2023-05-17: 4 g via INTRAVENOUS
  Filled 2023-05-17: qty 100

## 2023-05-17 MED ORDER — OXYCODONE HCL 5 MG/5ML PO SOLN: 5.0000 mg | Freq: Once | ORAL | Status: DC | PRN

## 2023-05-17 NOTE — Interval H&P Note (Signed)
 History and Physical Interval Note:  05/17/2023 3:45 PM  [name]  has presented today for surgery, with the diagnosis of melena, vomiting, abdominal pain, gastric antral wall thickening on scan, cirrhosis.  The various methods of treatment have been discussed with the patient and family. After consideration of risks, benefits and other options for treatment, the patient has consented to  Procedure(s): EGD (ESOPHAGOGASTRODUODENOSCOPY) (N/A) as a surgical intervention.  The patient's history has been reviewed, patient examined, no change in status, stable for surgery.  I have reviewed the patient's chart and labs.  Questions were answered to the patient's satisfaction.     Joan Wood

## 2023-05-17 NOTE — Telephone Encounter (Signed)
 Called to schedule pt for procedure. She stated she is currently admitted to the hospital and will be having EGD done today.

## 2023-05-17 NOTE — Transfer of Care (Signed)
 Immediate Anesthesia Transfer of Care Note  Patient: Joan Wood  Procedure(s) Performed: EGD (ESOPHAGOGASTRODUODENOSCOPY)  Patient Location: PACU  Anesthesia Type:General  Level of Consciousness: awake and alert   Airway & Oxygen Therapy: Patient Spontanous Breathing  Post-op Assessment: Report given to RN and Post -op Vital signs reviewed and stable  Post vital signs: Reviewed and stable  Last Vitals:  Vitals Value Taken Time  BP 103/66 05/17/23 1716  Temp 36.6 C 05/17/23 1715  Pulse 83 05/17/23 1717  Resp 17 05/17/23 1718  SpO2 100 % 05/17/23 1717  Vitals shown include unfiled device data.  Last Pain:  Vitals:   05/17/23 1536  TempSrc: Oral  PainSc: 0-No pain      Patients Stated Pain Goal: 6 (05/17/23 1536)  Complications: No notable events documented.

## 2023-05-17 NOTE — Anesthesia Postprocedure Evaluation (Signed)
 Anesthesia Post Note  Patient: Joan Wood  Procedure(s) Performed: EGD (ESOPHAGOGASTRODUODENOSCOPY)  Patient location during evaluation: PACU Anesthesia Type: General Level of consciousness: awake and alert Pain management: pain level controlled Vital Signs Assessment: post-procedure vital signs reviewed and stable Respiratory status: spontaneous breathing, nonlabored ventilation, respiratory function stable and patient connected to nasal cannula oxygen Cardiovascular status: blood pressure returned to baseline and stable Postop Assessment: no apparent nausea or vomiting Anesthetic complications: no   No notable events documented.   Last Vitals:  Vitals:   05/17/23 1536 05/17/23 1715  BP: 122/79 103/66  Pulse: 88 81  Resp: 18   Temp: 37 C 36.6 C  SpO2: 100% 100%    Last Pain:  Vitals:   05/17/23 1536  TempSrc: Oral  PainSc: 0-No pain                 Coretha Dew

## 2023-05-17 NOTE — Progress Notes (Signed)
 Mobility Specialist Progress Note:    05/17/23 1045  Mobility  Activity Transferred to/from BSC;Stood at bedside  Level of Assistance Minimal assist, patient does 75% or more  Assistive Device None  Distance Ambulated (ft) 3 ft  Range of Motion/Exercises Active;All extremities  Activity Response Tolerated well  Mobility Referral Yes  Mobility visit 1 Mobility  Mobility Specialist Start Time (ACUTE ONLY) 1030  Mobility Specialist Stop Time (ACUTE ONLY) 1045  Mobility Specialist Time Calculation (min) (ACUTE ONLY) 15 min   Pt received in bed requesting assistance to [facility]. Required MinA to stand and transfer with 1 person hand-held assist. Tolerated well, asx throughout. Returned pt supine, alarm on. All needs met.  Glinda Lapping Mobility Specialist Please contact via Special educational needs teacher or  Rehab office at [phone]

## 2023-05-17 NOTE — TOC Progression Note (Signed)
 Transition of Care [facility]) - Progression Note    Patient Details  Name: Joan Wood MRN: [id number] Date of Birth: 1970-06-14  Transition of [facility]) CM/SW Contact  Orelia Binet, RN Phone Number: 05/17/2023, 11:34 AM  Clinical Narrative:   Patient left AMA and returned next day. Work up continues, assessed on 5/4 TOC following, DC unknown.          Social Determinants of Health (SDOH) Interventions SDOH Screenings   Food Insecurity: No Food Insecurity (05/15/2023)  Housing: Low Risk  (05/15/2023)  Transportation Needs: No Transportation Needs (05/15/2023)  Utilities: Not At Risk (05/15/2023)  Depression (PHQ2-9): High Risk (03/29/2023)  Tobacco Use: High Risk (05/15/2023)    Readmission Risk Interventions     No data to display

## 2023-05-17 NOTE — Progress Notes (Addendum)
 Joan Wood  Assessment/ Plan: Pt is a 53 y.o. yo female  with PMH significant for HTN, GERD, anxiety, h/o SVT, and ongoing alcohol abuse with radiologic evidence of cirrhosis who presented to [facility] ED on 05/11/23 with a 3 day history of abdominal pain, nausea, vomiting, and diarrhea.   # Acute on chronic hyponatremia: The patient has likely beer potomania at baseline, however worsened sodium level with nausea vomiting diarrhea. Received IV fluid with improvement of sodium level to her baseline.  Clinically asymptomatic.  Discontinue IV fluid when she is able to take orally, currently NPO.  # ABLA - melena and CT with some gastritis.  Concerning for PUD and GI following for EGD.  #Alcoholic hepatitis - elevated AST and ALT.  Ct suggests cirrhosis with portal hypertension.  GI following.   #Alcohol abuse - pt reports that she has been trying to cut back.  CIWA per primary.  Thiamin and folic acid  supplementation.  #Hypokalemia - continue to replace and follow.    Sign off, please call us  back with question.  Discussed with the primary team.  Subjective: Seen and examined.  Patient is currently n.p.o. for endoscopy today.  Denies nausea, vomiting, chest pain or shortness of breath. Objective Vital signs in last 24 hours: Vitals:   05/16/23 0810 05/16/23 1232 05/16/23 2005 05/17/23 0412  BP: 92/67 106/77 92/64 (!) 94/52  Pulse: 83 87 81 80  Resp:  17 16 18   Temp: 98 F (36.7 C)  98.2 F (36.8 C) 98.2 F (36.8 C)  TempSrc: Oral  Oral   SpO2: 100% 98% 98% 95%  Weight:      Height:       Weight change:   Intake/Output Summary (Last 24 hours) at 05/17/2023 0911 Last data filed at 05/17/2023 1610 Gross per 24 hour  Intake 1019.45 ml  Output 1100 ml  Net -80.55 ml       Labs: RENAL PANEL Recent Labs  Lab 05/12/23 0940 05/12/23 1421 05/13/23 0222 05/13/23 0754 05/14/23 0454 05/14/23 0856 05/15/23 1248 05/16/23 0849 05/16/23 1327  05/16/23 1835 05/16/23 2141 05/17/23 0144 05/17/23 0618  NA 118*   < > 123*   < > 118*   < > 120* 126* 126* 127* 125* 127* 133*  K 2.8*  --  4.4  --  3.9  --  3.5 2.8*  --   --   --   --  3.5  CL 83*  --  95*  --  87*  --  85* 93*  --   --   --   --  104  CO2 22  --  21*  --  24  --  22 25  --   --   --   --  22  GLUCOSE 91  --  104*  --  102*  --  82 150*  --   --   --   --  100*  BUN 10  --  7  --  <5*  --  <5* 5*  --   --   --   --  <5*  CREATININE 0.43*  --  0.39*  --  0.35*  --  0.41* 0.51  --   --   --   --  0.47  CALCIUM 8.5*  --  7.7*  --  8.3*  --  8.7* 7.8*  --   --   --   --  7.8*  MG 2.0  --  1.7  --   --   --   --   --  1.5*  --   --   --  1.6*  ALBUMIN 3.3*  --  2.6*  --  2.8*  --  3.1*  --  2.6*  --   --   --  2.4*   < > = values in this interval not displayed.    Liver Function Tests: Recent Labs  Lab 05/15/23 1248 05/16/23 1327 05/17/23 0618  AST 116* 74* 62*  ALT 52* 38 33  ALKPHOS 75 70 58  BILITOT 1.5* 1.0 1.0  PROT 7.2 6.1* 5.5*  ALBUMIN 3.1* 2.6* 2.4*   Recent Labs  Lab 05/11/23 1512 05/12/23 1421 05/15/23 1154  LIPASE 73* 91* 49   No results for input(s): "AMMONIA" in the last 168 hours. CBC: Recent Labs    04/11/23 1455 05/11/23 1512 05/12/23 0930 05/12/23 0940 05/12/23 0943 05/13/23 0222 05/14/23 0454 05/15/23 1248 05/17/23 0618  HGB 13.2   < >  --  9.5*  --  8.3* 8.5* 9.9* 8.2*  MCV 97   < >  --  99.6  --  103.4* 102.0* 100.4* 103.8*  VITAMINB12  --   --  704  --   --   --   --   --   --   FOLATE  --   --   --   --  13.6  --   --   --   --   FERRITIN 165*  --  266  --   --   --   --   --   --   TIBC 373  --  317  --   --   --   --   --   --   IRON 73  --  119  --   --   --   --   --   --   RETICCTPCT  --   --   --   --  3.0  --   --   --   --    < > = values in this interval not displayed.    Cardiac Enzymes: No results for input(s): "CKTOTAL", "CKMB", "CKMBINDEX", "TROPONINI" in the last 168 hours. CBG: No results for  input(s): "GLUCAP" in the last 168 hours.  Iron Studies: No results for input(s): "IRON", "TIBC", "TRANSFERRIN", "FERRITIN" in the last 72 hours. Studies/Results: DG Foot Complete Right Result Date: 05/15/2023 CLINICAL DATA:  Prior fracture EXAM: RIGHT FOOT COMPLETE - 3+ VIEW COMPARISON:  04/15/2023 FINDINGS: Bony demineralization. Transverse fractures of the proximal metaphyses of the second, third, likely fourth metatarsals. These remain nondisplaced. No current malalignment at the Lisfranc joint. Dorsal soft tissue swelling along the forefoot. IMPRESSION: 1. Transverse fractures of the proximal metaphyses of the second, third, and likely fourth metatarsals. These remain nondisplaced. 2. Dorsal soft tissue swelling along the forefoot. 3. Bony demineralization. Electronically Signed   By: Freida Jes M.D.   On: 05/15/2023 12:47    Medications: Infusions:  sodium chloride  Stopped (05/17/23 0907)   magnesium  sulfate bolus IVPB      Scheduled Medications:  chlordiazePOXIDE  25 mg Oral TID   fluticasone  2 spray Each Nare Daily   folic acid   1 mg Oral Daily   multivitamin with minerals  1 tablet Oral Daily   nicotine  21 mg Transdermal Daily   pantoprazole  (PROTONIX ) IV  40 mg Intravenous Q12H   potassium chloride   40 mEq Oral Daily   sodium chloride  flush  3 mL Intravenous Q12H   sodium chloride  flush  3 mL Intravenous Q12H   thiamine   100 mg Oral Daily   Or   thiamine   100 mg Intravenous Daily    have reviewed scheduled and prn medications.  Physical Exam: General:NAD, comfortable Heart:RRR, s1s2 nl Lungs:clear b/l, no crackle Abdomen:soft, Non-tender, non-distended Extremities:No edema Neurology: Alert, awake and following commands  Namari Breton Prasad Dakota Vanwart 05/17/2023,9:11 AM  LOS: 1 day

## 2023-05-17 NOTE — Progress Notes (Signed)
 Patient briefly seen to evaluate for possible EGD today for history of melena, vomiting, abdominal pain, gastric antrum thickening on CT, and cirrhosis. Her sodium is now over 30, corrected calcium 8.7, K 3.5. INR acceptable range. Hgb 8.2 fluctuating last 48 hours but overall stable. No BM in one week.   Plan for EGD this afternoon. Allow clear liquids until noon, discussed with Dr. Mordechai April.  I have discussed the risks, alternatives, benefits with regards to but not limited to the risk of reaction to medication, bleeding, infection, perforation and the patient is agreeable to proceed. Written consent to be obtained.  Recommend start daily bowel regimen after EGD. Miralax 17 gram TID until soft stool, then daily.   D/C enteric precautions given no BM in one week.   Joan Wood. Joan Wood [facility] [phone] 5/8/202510:06 AM

## 2023-05-17 NOTE — Progress Notes (Signed)
 PROGRESS NOTE   Joan Wood  VWU:[id number] DOB: Oct 04, 1970 DOA: 05/15/2023 PCP: Zarwolo, Gloria, FNP   Chief Complaint  Patient presents with   Abdominal Pain   Level of care: Med-Surg  Brief Admission History:   53 y.o. female with medical history significant of history of ongoing alcohol abuse, GERD, HTN, SVT, periumbilical hernia, presumed alcoholic liver cirrhosis, IBS-D, anxiety disorder who was recently admitted to the hospital on 05/12/2023 after presenting with worsening abdominal pain, nausea, vomiting, melena.  Patient left AMA on 05/14/2023-despite being advised to wait for sodium to improve so she can have EGD - Went home and drank more alcohol - Returned to the ED on 05/15/2023--reports no BM since leaving AMA and admitted with worsening hyponatremia   Assessment and Plan:  Persistent Hyponatremia--sodium slowly improved, now up to 133  On-going EtOH abuse  - Beer Potomania -anesthesia and GI team requesting sodium to improve before they will do EGD so we will get Nephrology consult requested from Dr. Irene Mannheim he will see patient on 05/16/2023 -- slowly improving sodium up to 133 today -- continue to monitor daily  -- continue IV hydration -- check Mg, replace calcium   Hypomagnesemia - IV replacement given, recheck in AM   Hypokalemia - continue replacement as ordered - sodium improved to 135 today    Acute anemia--suspect due to blood loss and hemodilution  -CT abdomen and pelvis from 05/11/2023 showed-  Wall thickening of gastric antrum is noted suggesting possible inflammation or peptic ulcer disease. -Hemoglobin was 13.2 on 04/11/2023 -Hemoglobin down to 8.2 on 05/17/2023 -Patient with history of NSAID use and alcohol abuse, as well as concerns for alcoholic liver cirrhosis -GI consult appreciated -Protonix  as ordered -anticipating EGD later today with Dr. Mordechai April -clears diet until noon, then NPO for EGD today   Alcoholic liver cirrhosis--LFTs noted- AST 116 ALT 52  T. bili 1.5 -CT abdomen and pelvis from 05/11/2023 suggest liver cirrhosis with portal hypertension   Right foot x-rays show- Transverse fractures of the proximal metaphyses of the second, third, and likely fourth metatarsals. These remain nondisplaced.  - Supportive care - Patient removed her splint by herself - Outpatient follow-up with Ortho for xrays    Chronic daily alcohol abuse--- patient drinks 6-12 beers per day, she is trying to cut back --added librium 25 mg TID to try to avoid acute alcohol withdrawal but patient refusing to take  --Plan per CIWA protocol --Thiamine , folic acid  and multi-vitamins ordered   tobacco abuse--nicotine addiction --continue nicotine patch for craving symptoms  --ongoing tobacco cessation counseling    Constipation  - appreciate GI input, added miralax TID until soft BM then daily dosing   DVT prophylaxis:  SCDs Code Status: Full  Family Communication:  Disposition: Status is: Inpatient   Consultants:  GI inpatient   Procedures:  Tentative plan for EGD on 05/17/23    Antimicrobials:    Subjective: Pt says she has been sore from walking hallway yesterday, having some foot pain but agreeable to EGD today.      Objective: Vitals:   05/16/23 0810 05/16/23 1232 05/16/23 2005 05/17/23 0412  BP: 92/67 106/77 92/64 (!) 94/52  Pulse: 83 87 81 80  Resp:  17 16 18   Temp: 98 F (36.7 C)  98.2 F (36.8 C) 98.2 F (36.8 C)  TempSrc: Oral  Oral   SpO2: 100% 98% 98% 95%  Weight:      Height:        Intake/Output Summary (Last 24 hours)  at 05/17/2023 1058 Last data filed at 05/17/2023 1610 Gross per 24 hour  Intake 1019.45 ml  Output 1100 ml  Net -80.55 ml   Filed Weights   05/15/23 1022 05/15/23 1559  Weight: 56.2 kg 52.9 kg   Examination:  General exam: Appears calm and comfortable  Respiratory system: Clear to auscultation. Respiratory effort normal. Cardiovascular system: normal S1 & S2 heard. No JVD, murmurs, rubs, gallops or  clicks. No pedal edema. Gastrointestinal system: Abdomen is nondistended, soft and nontender. No organomegaly or masses felt. Normal bowel sounds heard. Central nervous system: Alert and oriented. No focal neurological deficits. Extremities: Symmetric 5 x 5 power. Skin: No rashes, lesions or ulcers. Psychiatry: Judgement and insight appear normal. Mood & affect appropriate.   Data Reviewed: I have personally reviewed following labs and imaging studies  CBC: Recent Labs  Lab 05/11/23 1512 05/12/23 0940 05/13/23 0222 05/14/23 0454 05/15/23 1248 05/17/23 0618  WBC 6.0 4.6 3.3* 3.9* 5.6 3.2*  NEUTROABS 4.0 3.4  --   --  3.7 1.4*  HGB 10.8* 9.5* 8.3* 8.5* 9.9* 8.2*  HCT 30.1* 26.5* 24.1* 25.4* 27.6* 24.5*  MCV 100.0 99.6 103.4* 102.0* 100.4* 103.8*  PLT 100* 91* PLATELETS APPEAR DECREASED 91* 126* 134*    Basic Metabolic Panel: Recent Labs  Lab 05/12/23 0940 05/12/23 1421 05/13/23 0222 05/13/23 0754 05/14/23 0454 05/14/23 0856 05/15/23 1248 05/16/23 0849 05/16/23 1327 05/16/23 1835 05/16/23 2141 05/17/23 0144 05/17/23 0618  NA 118*   < > 123*   < > 118*   < > 120* 126* 126* 127* 125* 127* 133*  K 2.8*  --  4.4  --  3.9  --  3.5 2.8*  --   --   --   --  3.5  CL 83*  --  95*  --  87*  --  85* 93*  --   --   --   --  104  CO2 22  --  21*  --  24  --  22 25  --   --   --   --  22  GLUCOSE 91  --  104*  --  102*  --  82 150*  --   --   --   --  100*  BUN 10  --  7  --  <5*  --  <5* 5*  --   --   --   --  <5*  CREATININE 0.43*  --  0.39*  --  0.35*  --  0.41* 0.51  --   --   --   --  0.47  CALCIUM 8.5*  --  7.7*  --  8.3*  --  8.7* 7.8*  --   --   --   --  7.8*  MG 2.0  --  1.7  --   --   --   --   --  1.5*  --   --   --  1.6*   < > = values in this interval not displayed.    CBG: No results for input(s): "GLUCAP" in the last 168 hours.  Recent Results (from the past 240 hours)  Blood culture (routine x 2)     Status: None   Collection Time: 05/11/23  3:09 PM    Specimen: BLOOD  Result Value Ref Range Status   Specimen Description BLOOD LEFT ANTECUBITAL  Final   Special Requests   Final    BOTTLES DRAWN AEROBIC AND ANAEROBIC Blood Culture adequate volume  Culture   Final    NO GROWTH 5 DAYS Performed at [facility], [address], [place], [name] [address]    Report Status 05/16/2023 FINAL  Final  Blood culture (routine x 2)     Status: None   Collection Time: 05/11/23  3:14 PM   Specimen: BLOOD  Result Value Ref Range Status   Specimen Description BLOOD RIGHT ANTECUBITAL  Final   Special Requests   Final    BOTTLES DRAWN AEROBIC AND ANAEROBIC Blood Culture adequate volume   Culture   Final    NO GROWTH 5 DAYS Performed at [facility], [address], [place], [name] [address]    Report Status 05/16/2023 FINAL  Final  MRSA Next Gen by PCR, Nasal     Status: None   Collection Time: 05/12/23  2:14 PM   Specimen: Nasal Mucosa; Nasal Swab  Result Value Ref Range Status   MRSA by PCR Next Gen NOT DETECTED NOT DETECTED Final    Comment: (NOTE) The GeneXpert MRSA Assay (FDA approved for NASAL specimens only), is one component of a comprehensive MRSA colonization surveillance program. It is not intended to diagnose MRSA infection nor to guide or monitor treatment for MRSA infections. Test performance is not FDA approved in patients less than 67 years old. Performed at [facility], [address], [place], [name] [address]      Radiology Studies: DG Foot Complete Right Result Date: 05/15/2023 CLINICAL DATA:  Prior fracture EXAM: RIGHT FOOT COMPLETE - 3+ VIEW COMPARISON:  04/15/2023 FINDINGS: Bony demineralization. Transverse fractures of the proximal metaphyses of the second, third, likely fourth metatarsals. These remain nondisplaced. No current malalignment at the Lisfranc joint. Dorsal soft tissue swelling along the forefoot. IMPRESSION: 1. Transverse fractures of the proximal metaphyses of the second, third, and likely fourth  metatarsals. These remain nondisplaced. 2. Dorsal soft tissue swelling along the forefoot. 3. Bony demineralization. Electronically Signed   By: Freida Jes M.D.   On: 05/15/2023 12:47    Scheduled Meds:  fluticasone  2 spray Each Nare Daily   folic acid   1 mg Oral Daily   multivitamin with minerals  1 tablet Oral Daily   nicotine  21 mg Transdermal Daily   pantoprazole  (PROTONIX ) IV  40 mg Intravenous Q12H   polyethylene glycol  17 g Oral TID   potassium chloride   40 mEq Oral Daily   sodium chloride  flush  3 mL Intravenous Q12H   sodium chloride  flush  3 mL Intravenous Q12H   thiamine   100 mg Oral Daily   Or   thiamine   100 mg Intravenous Daily   Continuous Infusions:  sodium chloride      magnesium  sulfate bolus IVPB 4 g (05/17/23 0911)    LOS: 1 day   Time spent: 55 mins  Carolle Ishii Lincoln Renshaw, MD How to contact the [facility] Attending or Consulting provider 7A - 7P or covering provider during after hours 7P -7A, for this patient?  Check the care team in [facility] and look for a) attending/consulting [facility] provider listed and b) the [facility] team listed Log into [email or web address] to find provider on call.  Locate the [facility] provider you are looking for under [facility] and page to a number that you can be directly reached. If you still have difficulty reaching the provider, please page the [facility] (Director on Call) for the Hospitalists listed on amion for assistance.  05/17/2023, 10:58 AM

## 2023-05-17 NOTE — Anesthesia Preprocedure Evaluation (Signed)
 Anesthesia Evaluation  Patient identified by MRN, date of birth, ID band Patient awake    Reviewed: Allergy & Precautions, H&P , NPO status , Patient's Chart, lab work & pertinent test results, reviewed documented beta blocker date and time   Airway Mallampati: II  TM Distance: >3 FB Neck ROM: full    Dental no notable dental hx.    Pulmonary neg pulmonary ROS, Current Smoker   Pulmonary exam normal breath sounds clear to auscultation       Cardiovascular Exercise Tolerance: Good hypertension,  Rhythm:regular Rate:Normal     Neuro/Psych  PSYCHIATRIC DISORDERS Anxiety     negative neurological ROS     GI/Hepatic Neg liver ROS,GERD  ,,  Endo/Other  negative endocrine ROS    Renal/GU negative Renal ROS  negative genitourinary   Musculoskeletal   Abdominal   Peds  Hematology negative hematology ROS (+)   Anesthesia Other Findings   Reproductive/Obstetrics negative OB ROS                             Anesthesia Physical Anesthesia Plan  ASA: 4 and emergent  Anesthesia Plan: General   Post-op Pain Management:    Induction:   PONV Risk Score and Plan: Propofol infusion  Airway Management Planned:   Additional Equipment:   Intra-op Plan:   Post-operative Plan:   Informed Consent: I have reviewed the patients History and Physical, chart, labs and discussed the procedure including the risks, benefits and alternatives for the proposed anesthesia with the patient or authorized representative who has indicated his/her understanding and acceptance.     Dental Advisory Given  Plan Discussed with: CRNA  Anesthesia Plan Comments:        Anesthesia Quick Evaluation

## 2023-05-17 NOTE — Plan of Care (Signed)
  Problem: Clinical Measurements: Goal: Ability to maintain clinical measurements within normal limits will improve Outcome: Progressing Goal: Will remain free from infection Outcome: Progressing   Problem: Coping: Goal: Level of anxiety will decrease Outcome: Not Met (add Reason)   Problem: Elimination: Goal: Will not experience complications related to bowel motility Outcome: Not Met (add Reason) Goal: Will not experience complications related to urinary retention Outcome: Progressing   Problem: Pain Managment: Goal: General experience of comfort will improve and/or be controlled Outcome: Not Met (add Reason)

## 2023-05-17 NOTE — Op Note (Addendum)
 [facility] Patient Name: Joan Wood Procedure Date: 05/17/2023 4:54 PM MRN: [id number] Date of Birth: Jun 05, 1970 Attending MD: Rolando Cliche. Mordechai April , [place], [id number] CSN: [id number] Age: 53 Admit Type: Inpatient Procedure:                Upper GI endoscopy Indications:              Epigastric abdominal pain, Abnormal CT of the GI                            tract Providers:                Rolando Cliche. Mordechai April, DO, Pasco Bond, RN, Kimball Pence Tech, Technician Referring MD:              Medicines:                See the Anesthesia note for documentation of the                            administered medications Complications:            No immediate complications. Estimated Blood Loss:     Estimated blood loss was minimal. Procedure:                Pre-Anesthesia Assessment:                           - The anesthesia plan was to use monitored                            anesthesia care (MAC).                           After obtaining informed consent, the endoscope was                            passed under direct vision. Throughout the                            procedure, the patient's blood pressure, pulse, and                            oxygen saturations were monitored continuously. The                            [id number]-H190 ([id number]) scope was introduced through the                            mouth, and advanced to the second part of duodenum.                            The upper GI endoscopy was accomplished without                            difficulty. The patient  tolerated the procedure                            well. Scope In: 5:08:12 PM Scope Out: 5:11:14 PM Total Procedure Duration: 0 hours 3 minutes 2 seconds  Findings:      LA Grade D (one or more mucosal breaks involving at least 75% of       esophageal circumference) esophagitis with no bleeding was found at the       gastroesophageal junction.      Mild portal hypertensive gastropathy  was found in the entire examined       stomach.      One non-bleeding cratered gastric ulcer with a clean ulcer base (Forrest       Class III) was found in the prepyloric region of the stomach. The lesion       was 15 mm in largest dimension. Biopsies were taken with a cold forceps       for histology.      Three non-bleeding cratered gastric ulcers with no stigmata of bleeding       were found in the prepyloric region of the stomach. The largest lesion       was 4 mm in largest dimension.      The duodenal bulb, first portion of the duodenum and second portion of       the duodenum were normal. Impression:               - LA Grade D reflux esophagitis with no bleeding.                           - Portal hypertensive gastropathy.                           - Non-bleeding gastric ulcer with a clean ulcer                            base (Forrest Class III). Biopsied.                           - Non-bleeding gastric ulcers with no stigmata of                            bleeding.                           - Normal duodenal bulb, first portion of the                            duodenum and second portion of the duodenum. Moderate Sedation:      Per Anesthesia Care Recommendation:           - Return patient to hospital ward for ongoing care.                           - Use a proton pump inhibitor PO BID.                           - Use sucralfate suspension 1 gram PO QID.                           -  No ibuprofen, naproxen, or other non-steroidal                            anti-inflammatory drugs.                           - Repeat upper endoscopy in 8-10 weeks to evaluate                            the response to therapy. Can perform colonoscopy at                            same time.                           - Return to GI office in 4 weeks. Procedure Code(s):        --- Professional ---                           [phone], Esophagogastroduodenoscopy, flexible,                             transoral; with biopsy, single or multiple Diagnosis Code(s):        --- Professional ---                           K21.00, Gastro-esophageal reflux disease with                            esophagitis, without bleeding                           K76.6, Portal hypertension                           K31.89, Other diseases of stomach and duodenum                           K25.9, Gastric ulcer, unspecified as acute or                            chronic, without hemorrhage or perforation                           R10.13, Epigastric pain                           R93.3, Abnormal findings on diagnostic imaging of                            other parts of digestive tract CPT copyright 2022 American Medical Association. All rights reserved. The codes documented in this report are preliminary and upon coder review may  be revised to meet current compliance requirements. Rolando Cliche. Mordechai April, DO Rolando Cliche. Mordechai April, DO 05/17/2023 5:15:17 PM This report has been signed electronically. Number of Addenda: 0

## 2023-05-18 ENCOUNTER — Telehealth: Payer: Self-pay

## 2023-05-18 ENCOUNTER — Encounter (HOSPITAL_COMMUNITY): Payer: Self-pay | Admitting: Internal Medicine

## 2023-05-18 ENCOUNTER — Telehealth: Payer: Self-pay | Admitting: Gastroenterology

## 2023-05-18 DIAGNOSIS — S92301D Fracture of unspecified metatarsal bone(s), right foot, subsequent encounter for fracture with routine healing: Secondary | ICD-10-CM

## 2023-05-18 DIAGNOSIS — F1090 Alcohol use, unspecified, uncomplicated: Secondary | ICD-10-CM | POA: Diagnosis not present

## 2023-05-18 DIAGNOSIS — E871 Hypo-osmolality and hyponatremia: Secondary | ICD-10-CM | POA: Diagnosis not present

## 2023-05-18 DIAGNOSIS — Z720 Tobacco use: Secondary | ICD-10-CM | POA: Diagnosis not present

## 2023-05-18 LAB — HEPATIC FUNCTION PANEL
ALT: 28 U/L (ref 0–44)
AST: 56 U/L — ABNORMAL HIGH (ref 15–41)
Albumin: 2.4 g/dL — ABNORMAL LOW (ref 3.5–5.0)
Alkaline Phosphatase: 61 U/L (ref 38–126)
Bilirubin, Direct: 0.3 mg/dL — ABNORMAL HIGH (ref 0.0–0.2)
Indirect Bilirubin: 0.7 mg/dL (ref 0.3–0.9)
Total Bilirubin: 1 mg/dL (ref 0.0–1.2)
Total Protein: 5.5 g/dL — ABNORMAL LOW (ref 6.5–8.1)

## 2023-05-18 LAB — BASIC METABOLIC PANEL WITH GFR
Anion gap: 6 (ref 5–15)
BUN: 5 mg/dL — ABNORMAL LOW (ref 6–20)
CO2: 24 mmol/L (ref 22–32)
Calcium: 7.8 mg/dL — ABNORMAL LOW (ref 8.9–10.3)
Chloride: 100 mmol/L (ref 98–111)
Creatinine, Ser: 0.43 mg/dL — ABNORMAL LOW (ref 0.44–1.00)
GFR, Estimated: >60 mL/min (ref >60)
Glucose, Bld: 117 mg/dL — ABNORMAL HIGH (ref 70–99)
Potassium: 3.9 mmol/L (ref 3.5–5.1)
Sodium: 130 mmol/L — ABNORMAL LOW (ref 135–145)

## 2023-05-18 LAB — CBC
HCT: 23 % — ABNORMAL LOW (ref 36.0–46.0)
Hemoglobin: 7.8 g/dL — ABNORMAL LOW (ref 12.0–15.0)
MCH: 35.5 pg — ABNORMAL HIGH (ref 26.0–34.0)
MCHC: 33.9 g/dL (ref 30.0–36.0)
MCV: 104.5 fL — ABNORMAL HIGH (ref 80.0–100.0)
Platelets: 144 10*3/uL — ABNORMAL LOW (ref 150–400)
RBC: 2.2 MIL/uL — ABNORMAL LOW (ref 3.87–5.11)
RDW: 13.7 % (ref 11.5–15.5)
WBC: 3 10*3/uL — ABNORMAL LOW (ref 4.0–10.5)
nRBC: 0 % (ref 0.0–0.2)

## 2023-05-18 LAB — MAGNESIUM: Magnesium: 1.9 mg/dL (ref 1.7–2.4)

## 2023-05-18 MED ORDER — MAGNESIUM SULFATE 2 GM/50ML IV SOLN
2.0000 g | Freq: Once | INTRAVENOUS | Status: AC
  Administered 2023-05-18: 2 g via INTRAVENOUS
  Filled 2023-05-18: qty 50

## 2023-05-18 MED ORDER — OXYCODONE HCL 5 MG PO TABS
5.0000 mg | ORAL_TABLET | Freq: Four times a day (QID) | ORAL | Status: DC | PRN
  Administered 2023-05-18: 5 mg via ORAL
  Filled 2023-05-18: qty 1

## 2023-05-18 NOTE — Telephone Encounter (Signed)
 Noted. Will schedule once we get July schedule

## 2023-05-18 NOTE — Progress Notes (Signed)
 Mobility Specialist Progress Note:    05/18/23 1110  Mobility  Activity Ambulated with assistance in hallway  Level of Assistance Contact guard assist, steadying assist  Assistive Device Cane  Distance Ambulated (ft) 120 ft  Range of Motion/Exercises Active;All extremities  Activity Response Tolerated well  Mobility Referral Yes  Mobility visit 1 Mobility  Mobility Specialist Start Time (ACUTE ONLY) 1110  Mobility Specialist Stop Time (ACUTE ONLY) 1155  Mobility Specialist Time Calculation (min) (ACUTE ONLY) 45 min   Pt received in bed, agreeable to mobility. Required CGA to stand and ambulate with straight cane. Tolerated well, c/o foot pain. Returned pt supine, all needs met.  Porfiria Heinrich Mobility Specialist Please contact via Special educational needs teacher or  Rehab office at [phone]

## 2023-05-18 NOTE — Telephone Encounter (Signed)
 Per Dr. Mordechai April  -patient being discharged on pantoprazole  40 mg twice daily, Carafate  4 times daily. Soft diet. Repeat EGD in 8 to 10 weeks. She can have outpatient colonoscopy at same time as repeat EGD.   Mindy/Tammy: Please reach out the patient to get her scheduled for EGD and colonoscopy using my previous orders from most recent office visit.  Please plan for procedure in 8-10 weeks.   Mandy/Ladonna/Susan (it will not let me send message to [facility]) -please arrange hospital follow-up with me or Dr. Mordechai April in 3-4 weeks.  Wynn Hector -need repeat CBC in 1 week.  Julian Obey, MSN, APRN, FNP-BC, AGACNP-BC [facility] Gastroenterology at [facility]

## 2023-05-18 NOTE — Progress Notes (Signed)
 GI signing off.  Patient with procedure yesterday.  Recommendations have been relayed to hospitalist.  Follow-up office visit and labs being arranged.  Julian Obey, MSN, APRN, FNP-BC, AGACNP-BC [facility] Gastroenterology at [facility]

## 2023-05-18 NOTE — Progress Notes (Signed)
 PROGRESS NOTE   RANDEEP COLGLAZIER  JXB:[id number] DOB: 10/28/1970 DOA: 05/15/2023 PCP: Zarwolo, Gloria, FNP   Chief Complaint  Patient presents with   Abdominal Pain   Level of care: Med-Surg  Brief Admission History:   53 y.o. female with medical history significant of history of ongoing alcohol abuse, GERD, HTN, SVT, periumbilical hernia, presumed alcoholic liver cirrhosis, IBS-D, anxiety disorder who was recently admitted to the hospital on 05/12/2023 after presenting with worsening abdominal pain, nausea, vomiting, melena.  Patient left AMA on 05/14/2023-despite being advised to wait for sodium to improve so she can have EGD - Went home and drank more alcohol - Returned to the ED on 05/15/2023--reports no BM since leaving AMA and admitted with worsening hyponatremia   Assessment and Plan:  Persistent Hyponatremia--sodium slowly improved, now up to 133  On-going EtOH abuse  - Beer Potomania -anesthesia and GI team requesting sodium to improve before they will do EGD so we will get Nephrology consult requested from Dr. Irene Mannheim he will see patient on 05/16/2023 -- slowly improving sodium up to 133 today -- continue to monitor daily  -- continue IV hydration -- check Mg, replaced calcium    Hypomagnesemia - IV replacement given, repleted    Hypokalemia - continue replacement as ordered - sodium improved to 139    Acute anemia--suspect due to blood loss and hemodilution  -CT abdomen and pelvis from 05/11/2023 showed-  Wall thickening of gastric antrum is noted suggesting possible inflammation or peptic ulcer disease. -Hemoglobin was 13.2 on 04/11/2023 -Hemoglobin down to 8.2 on 05/17/2023 -Patient with history of NSAID use and alcohol abuse, as well as concerns for alcoholic liver cirrhosis -GI consult appreciated -Protonix  as ordered -s/p EGD with Dr. Mordechai April with findings of esophageal varices, nonbleeding gastric ulcers and esophagitis with plans for her to take protonix  BID, sulcralfate QID  and follow up with [facility] for biopsy results.   -soft foods diet as tolerated   Alcoholic liver cirrhosis--LFTs noted- AST 116 ALT 52 T. bili 1.5 -CT abdomen and pelvis from 05/11/2023 suggest liver cirrhosis with portal hypertension   Right foot x-rays show- Transverse fractures of the proximal metaphyses of the second, third, and likely fourth metatarsals. These remain nondisplaced.  - Supportive care - Patient removed her splint by herself, walking with crutches - Outpatient follow-up with Ortho for xrays    Chronic daily alcohol abuse--- patient drinks 6-12 beers per day, she is trying to cut back --added librium  25 mg TID to try to avoid acute alcohol withdrawal but patient refusing to take  --Plan per [facility] protocol --Thiamine , folic acid  and multi-vitamins ordered   tobacco abuse--nicotine  addiction --continue nicotine  patch for craving symptoms  --ongoing tobacco cessation counseling    Constipation  - appreciate GI input, added miralax  TID until soft BM then daily dosing   DVT prophylaxis:  SCDs Code Status: Full  Family Communication:  Disposition: DC home tomorrow morning   Consultants:  GI inpatient   Procedures:  EGD on 05/17/23    Antimicrobials:    Subjective: Pt reports that she feels too weak today to go home.  Says she will have assistance for 24/7 supervision at home starting tomorrow.    Objective: Vitals:   05/17/23 1728 05/17/23 1800 05/17/23 1959 05/18/23 0536  BP: 118/74 107/82 (!) 101/58 106/67  Pulse: 80 87 85 91  Resp: 16  17 18   Temp:  98.5 F (36.9 C) 98 F (36.7 C) 98.4 F (36.9 C)  TempSrc:  Oral Oral  Oral  SpO2: 100% 100% 100% 97%  Weight:      Height:        Intake/Output Summary (Last 24 hours) at 05/18/2023 1428 Last data filed at 05/18/2023 1300 Gross per 24 hour  Intake 873.92 ml  Output 1150 ml  Net -276.08 ml   Filed Weights   05/15/23 1022 05/15/23 1559  Weight: 56.2 kg 52.9 kg   Examination:  General exam:  Appears calm and comfortable  Respiratory system: Clear to auscultation. Respiratory effort normal. Cardiovascular system: normal S1 & S2 heard. No JVD, murmurs, rubs, gallops or clicks. No pedal edema. Gastrointestinal system: Abdomen is nondistended, soft and nontender. No organomegaly or masses felt. Normal bowel sounds heard. Central nervous system: Alert and oriented. No focal neurological deficits. Extremities: Symmetric 5 x 5 power. Skin: No rashes, lesions or ulcers. Psychiatry: Judgement and insight appear normal. Mood & affect appropriate.   Data Reviewed: I have personally reviewed following labs and imaging studies  CBC: Recent Labs  Lab 05/11/23 1512 05/12/23 0940 05/13/23 0222 05/14/23 0454 05/15/23 1248 05/17/23 0618 05/18/23 0333  WBC 6.0 4.6 3.3* 3.9* 5.6 3.2* 3.0*  NEUTROABS 4.0 3.4  --   --  3.7 1.4*  --   HGB 10.8* 9.5* 8.3* 8.5* 9.9* 8.2* 7.8*  HCT 30.1* 26.5* 24.1* 25.4* 27.6* 24.5* 23.0*  MCV 100.0 99.6 103.4* 102.0* 100.4* 103.8* 104.5*  PLT 100* 91* PLATELETS APPEAR DECREASED 91* 126* 134* 144*    Basic Metabolic Panel: Recent Labs  Lab 05/12/23 0940 05/12/23 1421 05/13/23 0222 05/13/23 0754 05/14/23 0454 05/14/23 0856 05/15/23 1248 05/16/23 0849 05/16/23 1327 05/16/23 1835 05/16/23 2141 05/17/23 0144 05/17/23 0618 05/17/23 1357 05/18/23 0333  NA 118*   < > 123*   < > 118*   < > 120* 126* 126*   < > 125* 127* 133* 125* 130*  K 2.8*  --  4.4  --  3.9  --  3.5 2.8*  --   --   --   --  3.5  --  3.9  CL 83*  --  95*  --  87*  --  85* 93*  --   --   --   --  104  --  100  CO2 22  --  21*  --  24  --  22 25  --   --   --   --  22  --  24  GLUCOSE 91  --  104*  --  102*  --  82 150*  --   --   --   --  100*  --  117*  BUN 10  --  7  --  <5*  --  <5* 5*  --   --   --   --  <5*  --  5*  CREATININE 0.43*  --  0.39*  --  0.35*  --  0.41* 0.51  --   --   --   --  0.47  --  0.43*  CALCIUM  8.5*  --  7.7*  --  8.3*  --  8.7* 7.8*  --   --   --   --   7.8*  --  7.8*  MG 2.0  --  1.7  --   --   --   --   --  1.5*  --   --   --  1.6*  --  1.9   < > = values in this interval not displayed.  CBG: No results for input(s): "GLUCAP" in the last 168 hours.  Recent Results (from the past 240 hours)  Blood culture (routine x 2)     Status: None   Collection Time: 05/11/23  3:09 PM   Specimen: BLOOD  Result Value Ref Range Status   Specimen Description BLOOD LEFT ANTECUBITAL  Final   Special Requests   Final    BOTTLES DRAWN AEROBIC AND ANAEROBIC Blood Culture adequate volume   Culture   Final    NO GROWTH 5 DAYS Performed at [facility], [address], [place], [name] [address]    Report Status 05/16/2023 FINAL  Final  Blood culture (routine x 2)     Status: None   Collection Time: 05/11/23  3:14 PM   Specimen: BLOOD  Result Value Ref Range Status   Specimen Description BLOOD RIGHT ANTECUBITAL  Final   Special Requests   Final    BOTTLES DRAWN AEROBIC AND ANAEROBIC Blood Culture adequate volume   Culture   Final    NO GROWTH 5 DAYS Performed at [facility], [address] [place], [name] [address]    Report Status 05/16/2023 FINAL  Final  MRSA Next Gen by PCR, Nasal     Status: None   Collection Time: 05/12/23  2:14 PM   Specimen: Nasal Mucosa; Nasal Swab  Result Value Ref Range Status   MRSA by PCR Next Gen NOT DETECTED NOT DETECTED Final    Comment: (NOTE) The GeneXpert MRSA Assay (FDA approved for NASAL specimens only), is one component of a comprehensive MRSA colonization surveillance program. It is not intended to diagnose MRSA infection nor to guide or monitor treatment for MRSA infections. Test performance is not FDA approved in patients less than 10 years old. Performed at [facility], [address], [place], [name] [address]      Radiology Studies: No results found.   Scheduled Meds:  fluticasone   2 spray Each Nare Daily   folic acid   1 mg Oral Daily   lidocaine   1 patch Transdermal Q24H    multivitamin with minerals  1 tablet Oral Daily   nicotine   21 mg Transdermal Daily   pantoprazole  (PROTONIX ) IV  40 mg Intravenous Q12H   polyethylene glycol  17 g Oral TID   potassium chloride   40 mEq Oral Daily   sodium chloride  flush  3 mL Intravenous Q12H   sodium chloride  flush  3 mL Intravenous Q12H   sucralfate   1 g Oral TID WC & HS   thiamine   100 mg Oral Daily   Or   thiamine   100 mg Intravenous Daily   Continuous Infusions:    LOS: 2 days   Time spent: 55 mins  Jolynn Bajorek Lincoln Renshaw, MD How to contact the [facility] Attending or Consulting provider 7A - 7P or covering provider during after hours 7P -7A, for this patient?  Check the care team in [facility] and look for a) attending/consulting [facility] provider listed and b) the [facility] team listed Log into [email or web address] to find provider on call.  Locate the [facility] provider you are looking for under [facility] and page to a number that you can be directly reached. If you still have difficulty reaching the provider, please page the [facility] (Director on Call) for the Hospitalists listed on amion for assistance.  05/18/2023, 2:28 PM

## 2023-05-18 NOTE — Plan of Care (Signed)
  Problem: Clinical Measurements: Goal: Ability to maintain clinical measurements within normal limits will improve Outcome: Progressing Goal: Will remain free from infection Outcome: Progressing   Problem: Coping: Goal: Level of anxiety will decrease Outcome: Progressing   Problem: Elimination: Goal: Will not experience complications related to bowel motility Outcome: Progressing Goal: Will not experience complications related to urinary retention Outcome: Progressing   Problem: Pain Managment: Goal: General experience of comfort will improve and/or be controlled Outcome: Progressing   Problem: Safety: Goal: Ability to remain free from injury will improve Outcome: Progressing

## 2023-05-18 NOTE — Plan of Care (Signed)
   Problem: Clinical Measurements: Goal: Ability to maintain clinical measurements within normal limits will improve Outcome: Progressing Goal: Will remain free from infection Outcome: Progressing   Problem: Coping: Goal: Level of anxiety will decrease Outcome: Progressing

## 2023-05-19 NOTE — Discharge Summary (Signed)
 Physician Discharge Summary  Joan Wood GNF:[id number] DOB: 06-20-1970 DOA: 05/15/2023  PCP: Zarwolo, Gloria, FNP  Admit date: 05/15/2023 Discharge date: 05/18/2023  Disposition:  Home  Pt discharged against medical advice  Brief Hospitalization Summary: Please see all hospital notes, images, labs for full details of the hospitalization. Brief Admission History:   53 y.o. female with medical history significant of history of ongoing alcohol abuse, GERD, HTN, SVT, periumbilical hernia, presumed alcoholic liver cirrhosis, IBS-D, anxiety disorder who was recently admitted to the hospital on 05/12/2023 after presenting with worsening abdominal pain, nausea, vomiting, melena.  Patient left AMA on 05/14/2023-despite being advised to wait for sodium to improve so she can have EGD - Went home and drank more alcohol - Returned to the ED on 05/15/2023--reports no BM since leaving AMA and admitted with worsening hyponatremia   Assessment and Plan:   Persistent Hyponatremia--sodium slowly improved, now up to 130  On-going EtOH abuse  - Beer [place]ia -anesthesia and GI team requesting sodium to improve before they will do EGD so we will get Nephrology consult requested from Dr. Irene Mannheim consulted -- slowly improving sodium up to 133 today -- continue to monitor daily  -- continue IV hydration -- check Mg, replaced calcium     Hypomagnesemia - IV replacement given, repleted     Hypokalemia - continue replacement as ordered - sodium improved to 139    Acute anemia--suspect due to blood loss and hemodilution  -CT abdomen and pelvis from 05/11/2023 showed-  Wall thickening of gastric antrum is noted suggesting possible inflammation or peptic ulcer disease. -Hemoglobin was 13.2 on 04/11/2023 -Hemoglobin down to 8.2 on 05/17/2023 -Patient with history of NSAID use and alcohol abuse, as well as concerns for alcoholic liver cirrhosis -GI consult appreciated -Protonix  as ordered -s/p EGD with Dr. Mordechai April with  findings of esophageal varices, nonbleeding gastric ulcers and esophagitis with plans for her to take protonix  BID, sulcralfate QID and follow up with [facility] for biopsy results.   -soft foods diet as tolerated   Alcoholic liver cirrhosis--LFTs noted- AST 116 ALT 52 T. bili 1.5 -CT abdomen and pelvis from 05/11/2023 suggest liver cirrhosis with portal hypertension   Right foot x-rays show- Transverse fractures of the proximal metaphyses of the second, third, and likely fourth metatarsals. These remain nondisplaced.  - Supportive care - Patient removed her splint by herself, walking with crutches - Outpatient follow-up with Ortho for xrays    Chronic daily alcohol abuse--- patient drinks 6-12 beers per day, she is trying to cut back --added librium  25 mg TID to try to avoid acute alcohol withdrawal but patient refusing to take  --Plan per CIWA protocol --Thiamine , folic acid  and multi-vitamins ordered   tobacco abuse--nicotine  addiction --continue nicotine  patch for craving symptoms  --ongoing tobacco cessation counseling    Constipation  - appreciate GI input, added miralax  TID until soft BM then daily dosing    Discharge Diagnoses:  Principal Problem:   Hyponatremia Active Problems:   Alcohol use disorder   Irritable bowel syndrome with diarrhea   GERD (gastroesophageal reflux disease)   Multiple closed fractures of metatarsal bone with routine healing, right   Acute hyponatremia   Tobacco abuse   Hypomagnesemia   Discharge Instructions:  Allergies as of 05/18/2023       Reactions   Bee Venom Anaphylaxis   Cannot take epi for this allergy d/t heart condition per pt   Latex Rash        Medication List  ASK your doctor about these medications    acetaminophen  500 MG tablet Commonly known as: TYLENOL  Take 500 mg by mouth every 6 (six) hours as needed for moderate pain (pain score 4-6).   bismuth subsalicylate 262 MG/15ML suspension Commonly known as: PEPTO  BISMOL Take 30 mLs by mouth every 6 (six) hours as needed for diarrhea or loose stools.   diphenhydramine-acetaminophen  25-500 MG Tabs tablet Commonly known as: TYLENOL  PM Take 1 tablet by mouth at bedtime as needed (pain/sleep).   folic acid  1 MG tablet Commonly known as: FOLVITE  Take 1 tablet (1 mg total) by mouth daily.   gabapentin  100 MG capsule Commonly known as: NEURONTIN  Take 100 mg by mouth daily as needed (nerve pain).   meloxicam  15 MG tablet Commonly known as: MOBIC  Take 1 tablet (15 mg total) by mouth daily.   omeprazole  40 MG capsule Commonly known as: PRILOSEC Take 1 capsule (40 mg total) by mouth daily.   ondansetron  4 MG tablet Commonly known as: Zofran  Take 1 tablet (4 mg total) by mouth 2 (two) times daily as needed for nausea or vomiting.   pyridOXINE 50 MG tablet Commonly known as: VITAMIN B6 Take 1 tablet (50 mg total) by mouth daily.   Vitamin D  (Ergocalciferol ) 1.25 MG (50000 UNIT) Caps capsule Commonly known as: DRISDOL  Take 1 capsule (50,000 Units total) by mouth every 7 (seven) days.        Allergies  Allergen Reactions   Bee Venom Anaphylaxis    Cannot take epi for this allergy d/t heart condition per pt   Latex Rash   Allergies as of 05/18/2023       Reactions   Bee Venom Anaphylaxis   Cannot take epi for this allergy d/t heart condition per pt   Latex Rash        Medication List     ASK your doctor about these medications    acetaminophen  500 MG tablet Commonly known as: TYLENOL  Take 500 mg by mouth every 6 (six) hours as needed for moderate pain (pain score 4-6).   bismuth subsalicylate 262 MG/15ML suspension Commonly known as: PEPTO BISMOL Take 30 mLs by mouth every 6 (six) hours as needed for diarrhea or loose stools.   diphenhydramine-acetaminophen  25-500 MG Tabs tablet Commonly known as: TYLENOL  PM Take 1 tablet by mouth at bedtime as needed (pain/sleep).   folic acid  1 MG tablet Commonly known as: FOLVITE  Take  1 tablet (1 mg total) by mouth daily.   gabapentin  100 MG capsule Commonly known as: NEURONTIN  Take 100 mg by mouth daily as needed (nerve pain).   meloxicam  15 MG tablet Commonly known as: MOBIC  Take 1 tablet (15 mg total) by mouth daily.   omeprazole  40 MG capsule Commonly known as: PRILOSEC Take 1 capsule (40 mg total) by mouth daily.   ondansetron  4 MG tablet Commonly known as: Zofran  Take 1 tablet (4 mg total) by mouth 2 (two) times daily as needed for nausea or vomiting.   pyridOXINE 50 MG tablet Commonly known as: VITAMIN B6 Take 1 tablet (50 mg total) by mouth daily.   Vitamin D  (Ergocalciferol ) 1.25 MG (50000 UNIT) Caps capsule Commonly known as: DRISDOL  Take 1 capsule (50,000 Units total) by mouth every 7 (seven) days.        Procedures/Studies: DG Foot Complete Right Result Date: 05/15/2023 CLINICAL DATA:  Prior fracture EXAM: RIGHT FOOT COMPLETE - 3+ VIEW COMPARISON:  04/15/2023 FINDINGS: Bony demineralization. Transverse fractures of the proximal metaphyses of the second,  third, likely fourth metatarsals. These remain nondisplaced. No current malalignment at the Lisfranc joint. Dorsal soft tissue swelling along the forefoot. IMPRESSION: 1. Transverse fractures of the proximal metaphyses of the second, third, and likely fourth metatarsals. These remain nondisplaced. 2. Dorsal soft tissue swelling along the forefoot. 3. Bony demineralization. Electronically Signed   By: Freida Jes M.D.   On: 05/15/2023 12:47   CT Chest Wo Contrast Result Date: 05/12/2023 CLINICAL DATA:  53 year old female status post fall backwards yesterday. Cirrhosis. Syncope. Pain. EXAM: CT CHEST WITHOUT CONTRAST TECHNIQUE: Multidetector CT imaging of the chest was performed following the standard protocol without IV contrast. RADIATION DOSE REDUCTION: This exam was performed according to the departmental dose-optimization program which includes automated exposure control, adjustment of the mA  and/or kV according to patient size and/or use of iterative reconstruction technique. COMPARISON:  Cervical spine CT today. CT Abdomen and Pelvis yesterday. FINDINGS: Cardiovascular: Calcified aortic atherosclerosis. Calcified coronary artery atherosclerosis on series 2, image 80. No cardiomegaly. No pericardial effusion. Vascular patency is not evaluated in the absence of IV contrast. Mediastinum/Nodes: Negative for mediastinal mass or lymphadenopathy. Lungs/Pleura: Major airways are patent. Normal lung volumes. Both lungs are clear. No pleural effusion. Upper Abdomen: Stable compared to the CT Abdomen and Pelvis yesterday. Musculoskeletal: Mild superior thoracic endplate compression fractures at T1, T3, T9 (moderate), T12. The latter 2 levels appear to be chronic. Underlying osteopenia. No retropulsion or complicating features. No rib fracture identified. Sternum and visible shoulder osseous structures appear intact. IMPRESSION: 1. Osteopenia with multiple generally mild thoracic compression fractures, T1, T3, T9, and T12. T1 and T3 appear age indeterminate. 2. No other acute traumatic injury identified in the noncontrast Chest. 3. Calcified coronary artery and Aortic Atherosclerosis (ICD10-I70.0). 4. Upper abdomen stable from CT Abdomen and Pelvis yesterday. Electronically Signed   By: Marlise Simpers M.D.   On: 05/12/2023 10:23   CT Cervical Spine Wo Contrast Result Date: 05/12/2023 CLINICAL DATA:  53 year old female status post fall backwards yesterday. Cirrhosis. Syncope. Pain. EXAM: CT CERVICAL SPINE WITHOUT CONTRAST TECHNIQUE: Multidetector CT imaging of the cervical spine was performed without intravenous contrast. Multiplanar CT image reconstructions were also generated. RADIATION DOSE REDUCTION: This exam was performed according to the departmental dose-optimization program which includes automated exposure control, adjustment of the mA and/or kV according to patient size and/or use of iterative reconstruction  technique. COMPARISON:  Head CT today.  Chest CT reported separately. FINDINGS: Alignment: Straightening of cervical lordosis. Cervicothoracic junction alignment is within normal limits. Mild degenerative appearing retrolisthesis of C5 on C6 with associated disc space loss. Bilateral posterior element alignment is within normal limits. Skull base and vertebrae: Osteopenia. Visualized skull base is intact. No atlanto-occipital dissociation. C1 and C2 appear intact and aligned. No acute osseous abnormality identified. Soft tissues and spinal canal: No prevertebral fluid or swelling. No visible canal hematoma. Negative visible noncontrast neck soft tissues. Disc levels: Mild for age cervical spine degeneration except at C5-C6 where disc space loss and asymmetric endplate spurring is associated with mild retrolisthesis. Probably no significant cervical spinal stenosis by CT. Upper chest: Subtle T1 superior endplate compression is age indeterminate. No retropulsion or complicating features. Lung apices are clear. IMPRESSION: 1. No acute traumatic injury identified in the cervical spine. 2. Osteopenia.  Chronic degeneration at C5-C6. 3. Mild T1 superior endplate compression fracture is age indeterminate. Electronically Signed   By: Marlise Simpers M.D.   On: 05/12/2023 10:19   CT Head Wo Contrast Result Date: 05/12/2023 CLINICAL DATA:  53 year old  female status post fall backwards yesterday. Cirrhosis. Syncope. Pain. EXAM: CT HEAD WITHOUT CONTRAST TECHNIQUE: Contiguous axial images were obtained from the base of the skull through the vertex without intravenous contrast. RADIATION DOSE REDUCTION: This exam was performed according to the departmental dose-optimization program which includes automated exposure control, adjustment of the mA and/or kV according to patient size and/or use of iterative reconstruction technique. COMPARISON:  None Available. FINDINGS: Brain: Cerebral volume probably at the lower limits of normal for  age. No midline shift, ventriculomegaly, mass effect, evidence of mass lesion, intracranial hemorrhage or evidence of cortically based acute infarction. Patchy, symmetric moderate bilateral cerebral white matter hypodensity which is mostly periventricular. Vascular: No suspicious intracranial vascular hyperdensity. Calcified atherosclerosis at the skull base. Skull: Osteopenia. Intact. No acute osseous abnormality identified. Sinuses/Orbits: Visualized paranasal sinuses and mastoids are clear. Other: No acute orbit or scalp soft tissue injury identified. IMPRESSION: 1. No acute traumatic injury identified. 2. Moderate cerebral white matter changes, most commonly due to small vessel disease. Electronically Signed   By: Marlise Simpers M.D.   On: 05/12/2023 10:17   CT ABDOMEN PELVIS W CONTRAST Result Date: 05/11/2023 CLINICAL DATA:  Acute generalized abdominal pain, nausea, vomiting. EXAM: CT ABDOMEN AND PELVIS WITH CONTRAST TECHNIQUE: Multidetector CT imaging of the abdomen and pelvis was performed using the standard protocol following bolus administration of intravenous contrast. RADIATION DOSE REDUCTION: This exam was performed according to the departmental dose-optimization program which includes automated exposure control, adjustment of the mA and/or kV according to patient size and/or use of iterative reconstruction technique. CONTRAST:  OMNIPAQUE  IOHEXOL  300 MG/ML  SOLN COMPARISON:  April 14, 2023.  August 14, 2020. FINDINGS: Lower chest: No acute abnormality. Hepatobiliary: Nodular hepatic contours are noted consistent with hepatic cirrhosis. No cholelithiasis or biliary dilatation is noted. Patent periumbilical vein is noted consistent with portal hypertension. Stable probable hemangioma seen inferiorly in right hepatic lobe. Pancreas: Unremarkable. No pancreatic ductal dilatation or surrounding inflammatory changes. Spleen: Normal in size without focal abnormality. Adrenals/Urinary Tract: Adrenal glands are  unremarkable. Kidneys are normal, without renal calculi, focal lesion, or hydronephrosis. Bladder is unremarkable. Stomach/Bowel: Wall thickening of gastric antrum is noted suggesting possible inflammation or peptic ulcer disease. There is no evidence of bowel obstruction. The appendix is unremarkable. Vascular/Lymphatic: Aortic atherosclerosis. No enlarged abdominal or pelvic lymph nodes. Reproductive: Uterus and bilateral adnexa are unremarkable. Other: No ascites is noted. Musculoskeletal: No acute or significant osseous findings. IMPRESSION: Hepatic cirrhosis is noted with recanalized periumbilical vein consistent with portal hypertension. Wall thickening of gastric antrum is noted suggesting possible inflammation or peptic ulcer disease. Aortic Atherosclerosis (ICD10-I70.0). Electronically Signed   By: Rosalene Colon M.D.   On: 05/11/2023 17:07     Subjective:   Discharge Exam: Vitals:   05/18/23 0536 05/18/23 1433  BP: 106/67 (!) 97/58  Pulse: 91 74  Resp: 18 19  Temp: 98.4 F (36.9 C) 98 F (36.7 C)  SpO2: 97% 100%   Vitals:   05/17/23 1800 05/17/23 1959 05/18/23 0536 05/18/23 1433  BP: 107/82 (!) 101/58 106/67 (!) 97/58  Pulse: 87 85 91 74  Resp:  17 18 19   Temp: 98.5 F (36.9 C) 98 F (36.7 C) 98.4 F (36.9 C) 98 F (36.7 C)  TempSrc: Oral Oral Oral Oral  SpO2: 100% 100% 97% 100%  Weight:      Height:        General: Pt is alert, awake, not in acute distress Cardiovascular: RRR, S1/S2 +, no rubs,  no gallops Respiratory: CTA bilaterally, no wheezing, no rhonchi Abdominal: Soft, NT, ND, bowel sounds + Extremities: no edema, no cyanosis   The results of significant diagnostics from this hospitalization (including imaging, microbiology, ancillary and laboratory) are listed below for reference.     Microbiology: Recent Results (from the past 240 hours)  Blood culture (routine x 2)     Status: None   Collection Time: 05/11/23  3:09 PM   Specimen: BLOOD  Result Value  Ref Range Status   Specimen Description BLOOD LEFT ANTECUBITAL  Final   Special Requests   Final    BOTTLES DRAWN AEROBIC AND ANAEROBIC Blood Culture adequate volume   Culture   Final    NO GROWTH 5 DAYS Performed at [facility], [address], [place], [name] [address]    Report Status 05/16/2023 FINAL  Final  Blood culture (routine x 2)     Status: None   Collection Time: 05/11/23  3:14 PM   Specimen: BLOOD  Result Value Ref Range Status   Specimen Description BLOOD RIGHT ANTECUBITAL  Final   Special Requests   Final    BOTTLES DRAWN AEROBIC AND ANAEROBIC Blood Culture adequate volume   Culture   Final    NO GROWTH 5 DAYS Performed at [facility], [address] [place], [name] [address]    Report Status 05/16/2023 FINAL  Final  MRSA Next Gen by PCR, Nasal     Status: None   Collection Time: 05/12/23  2:14 PM   Specimen: Nasal Mucosa; Nasal Swab  Result Value Ref Range Status   MRSA by PCR Next Gen NOT DETECTED NOT DETECTED Final    Comment: (NOTE) The GeneXpert MRSA Assay (FDA approved for NASAL specimens only), is one component of a comprehensive MRSA colonization surveillance program. It is not intended to diagnose MRSA infection nor to guide or monitor treatment for MRSA infections. Test performance is not FDA approved in patients less than 75 years old. Performed at [facility], [address] [place], [name] [address]      Labs: BNP (last 3 results) No results for input(s): "BNP" in the last 8760 hours. Basic Metabolic Panel: Recent Labs  Lab 05/13/23 0222 05/13/23 0754 05/14/23 0454 05/14/23 0856 05/15/23 1248 05/16/23 0849 05/16/23 1327 05/16/23 1835 05/16/23 2141 05/17/23 0144 05/17/23 0618 05/17/23 1357 05/18/23 0333  NA 123*   < > 118*   < > 120* 126* 126*   < > 125* 127* 133* 125* 130*  K 4.4  --  3.9  --  3.5 2.8*  --   --   --   --  3.5  --  3.9  CL 95*  --  87*  --  85* 93*  --   --   --   --  104  --  100  CO2 21*  --  24  --  22 25   --   --   --   --  22  --  24  GLUCOSE 104*  --  102*  --  82 150*  --   --   --   --  100*  --  117*  BUN 7  --  <5*  --  <5* 5*  --   --   --   --  <5*  --  5*  CREATININE 0.39*  --  0.35*  --  0.41* 0.51  --   --   --   --  0.47  --  0.43*  CALCIUM  7.7*  --  8.3*  --  8.7* 7.8*  --   --   --   --  7.8*  --  7.8*  MG 1.7  --   --   --   --   --  1.5*  --   --   --  1.6*  --  1.9   < > = values in this interval not displayed.   Liver Function Tests: Recent Labs  Lab 05/14/23 0454 05/15/23 1248 05/16/23 1327 05/17/23 0618 05/18/23 0333  AST 123* 116* 74* 62* 56*  ALT 44 52* 38 33 28  ALKPHOS 64 75 70 58 61  BILITOT 1.7* 1.5* 1.0 1.0 1.0  PROT 6.5 7.2 6.1* 5.5* 5.5*  ALBUMIN 2.8* 3.1* 2.6* 2.4* 2.4*   Recent Labs  Lab 05/12/23 1421 05/15/23 1154  LIPASE 91* 49   No results for input(s): "AMMONIA" in the last 168 hours. CBC: Recent Labs  Lab 05/13/23 0222 05/14/23 0454 05/15/23 1248 05/17/23 0618 05/18/23 0333  WBC 3.3* 3.9* 5.6 3.2* 3.0*  NEUTROABS  --   --  3.7 1.4*  --   HGB 8.3* 8.5* 9.9* 8.2* 7.8*  HCT 24.1* 25.4* 27.6* 24.5* 23.0*  MCV 103.4* 102.0* 100.4* 103.8* 104.5*  PLT PLATELETS APPEAR DECREASED 91* 126* 134* 144*   Cardiac Enzymes: No results for input(s): "CKTOTAL", "CKMB", "CKMBINDEX", "TROPONINI" in the last 168 hours. BNP: Invalid input(s): "POCBNP" CBG: No results for input(s): "GLUCAP" in the last 168 hours. D-Dimer No results for input(s): "DDIMER" in the last 72 hours. Hgb A1c No results for input(s): "HGBA1C" in the last 72 hours. Lipid Profile No results for input(s): "CHOL", "HDL", "LDLCALC", "TRIG", "CHOLHDL", "LDLDIRECT" in the last 72 hours. Thyroid  function studies No results for input(s): "TSH", "T4TOTAL", "T3FREE", "THYROIDAB" in the last 72 hours.  Invalid input(s): "FREET3" Anemia work up No results for input(s): "VITAMINB12", "FOLATE", "FERRITIN", "TIBC", "IRON", "RETICCTPCT" in the last 72 hours. Urinalysis    Component  Value Date/Time   COLORURINE YELLOW 05/15/2023 2029   APPEARANCEUR CLEAR 05/15/2023 2029   LABSPEC 1.006 05/15/2023 2029   PHURINE 6.0 05/15/2023 2029   GLUCOSEU NEGATIVE 05/15/2023 2029   HGBUR NEGATIVE 05/15/2023 2029   BILIRUBINUR NEGATIVE 05/15/2023 2029   KETONESUR 5 (A) 05/15/2023 2029   PROTEINUR NEGATIVE 05/15/2023 2029   NITRITE NEGATIVE 05/15/2023 2029   LEUKOCYTESUR NEGATIVE 05/15/2023 2029   Sepsis Labs Recent Labs  Lab 05/14/23 0454 05/15/23 1248 05/17/23 0618 05/18/23 0333  WBC 3.9* 5.6 3.2* 3.0*   Microbiology Recent Results (from the past 240 hours)  Blood culture (routine x 2)     Status: None   Collection Time: 05/11/23  3:09 PM   Specimen: BLOOD  Result Value Ref Range Status   Specimen Description BLOOD LEFT ANTECUBITAL  Final   Special Requests   Final    BOTTLES DRAWN AEROBIC AND ANAEROBIC Blood Culture adequate volume   Culture   Final    NO GROWTH 5 DAYS Performed at [facility], [address], [place], [name] [address]    Report Status 05/16/2023 FINAL  Final  Blood culture (routine x 2)     Status: None   Collection Time: 05/11/23  3:14 PM   Specimen: BLOOD  Result Value Ref Range Status   Specimen Description BLOOD RIGHT ANTECUBITAL  Final   Special Requests   Final    BOTTLES DRAWN AEROBIC AND ANAEROBIC Blood Culture adequate volume   Culture   Final  NO GROWTH 5 DAYS Performed at [facility], [address], [place], [name] [address]    Report Status 05/16/2023 FINAL  Final  MRSA Next Gen by PCR, Nasal     Status: None   Collection Time: 05/12/23  2:14 PM   Specimen: Nasal Mucosa; Nasal Swab  Result Value Ref Range Status   MRSA by PCR Next Gen NOT DETECTED NOT DETECTED Final    Comment: (NOTE) The GeneXpert MRSA Assay (FDA approved for NASAL specimens only), is one component of a comprehensive MRSA colonization surveillance program. It is not intended to diagnose MRSA infection nor to guide or monitor treatment for MRSA  infections. Test performance is not FDA approved in patients less than 29 years old. Performed at [facility], [address] [place], [name] [address]    Time coordinating discharge: 28 mins  SIGNED:  Faustino Hook, MD  [facility] 05/19/2023, 10:56 AM How to contact the [facility] Attending or Consulting provider 7A - 7P or covering provider during after hours 7P -7A, for this patient?  Check the care team in [facility] and look for a) attending/consulting [facility] provider listed and b) the [facility] team listed Log into [email or web address] and use [place]'s universal password to access. If you do not have the password, please contact the hospital operator. Locate the [facility] provider you are looking for under [facility] and page to a number that you can be directly reached. If you still have difficulty reaching the provider, please page the [facility] (Director on Call) for the Hospitalists listed on amion for assistance.

## 2023-05-21 ENCOUNTER — Other Ambulatory Visit: Payer: Self-pay | Admitting: Family Medicine

## 2023-05-21 ENCOUNTER — Other Ambulatory Visit: Payer: Self-pay | Admitting: *Deleted

## 2023-05-21 ENCOUNTER — Telehealth: Payer: Self-pay

## 2023-05-21 ENCOUNTER — Encounter: Payer: Self-pay | Admitting: *Deleted

## 2023-05-21 ENCOUNTER — Ambulatory Visit: Payer: Self-pay

## 2023-05-21 DIAGNOSIS — K625 Hemorrhage of anus and rectum: Secondary | ICD-10-CM

## 2023-05-21 DIAGNOSIS — R7989 Other specified abnormal findings of blood chemistry: Secondary | ICD-10-CM

## 2023-05-21 DIAGNOSIS — M25552 Pain in left hip: Secondary | ICD-10-CM

## 2023-05-21 MED ORDER — MELOXICAM 15 MG PO TABS: 15.0000 mg | ORAL_TABLET | Freq: Every day | ORAL | 2 refills | Status: DC

## 2023-05-21 NOTE — Telephone Encounter (Signed)
 LMOM for pt to call office. Also sent pt a MyChart message. Labs entered into Epic.

## 2023-05-21 NOTE — Telephone Encounter (Signed)
 Pt has a appointment this week will cover that at appt

## 2023-05-21 NOTE — Telephone Encounter (Signed)
  Chief Complaint: Pt has questions about new diet. And wants to discuss recent hospital stay and lab results. Symptoms:  Frequency: since discharge Pertinent Negatives: Patient denies  Disposition: [] ED /[] Urgent Care (no appt availability in office) / [x] Appointment(In office/virtual)/ []  [facility]/ [] Home Care/ [] Refused Recommended Disposition /[] [facility] [facility]/ []  Follow-up with PCP Additional Notes: Pt was recently discharged from hospital and has questions about diet. Pt states that she was told she needed to follow a certain diet that was not explained. Also pt wants more information regarding lab work. Pt states that it was recorded that she left AMA, however she states she did not leave AMA. VV appt scheduled for tomorrow morning. Pt has transportaion challenges.    Copied from CRM [id number]. Topic: Clinical - Lab/Test Results >> May 21, 2023  8:48 AM Alpha Arts wrote: Reason for CRM: Patient has further questions about her labs. She would like to know what special diet she was put on and what she can and can't eat. She stated her sodium and magnesium  were low. Reason for Disposition . Requesting regular office appointment  Answer Assessment - Initial Assessment Questions 1. REASON FOR CALL or QUESTION: "What is your reason for calling today?" or "How can I best help you?" or "What question do you have that I can help answer?"     Pt has questions regarding recent hospital stay and dietary recommendations.  Protocols used: Information Only Call - No Triage-A-AH

## 2023-05-21 NOTE — Telephone Encounter (Signed)
 Copied from CRM [id number]. Topic: Clinical - Medication Refill >> May 21, 2023  9:44 AM Rosaria Common wrote: Medication: Tramadol  50 MG (not on list), and meloxicam  (MOBIC ) 15 MG tablet  Has the patient contacted their pharmacy? Yes (Agent: If no, request that the patient contact the pharmacy for the refill. If patient does not wish to contact the pharmacy document the reason why and proceed with request.) (Agent: If yes, when and what did the pharmacy advise?)  This is the patient's preferred pharmacy:  [id number] - [place], [place] - [address] AT [facility] [address] [name] [address] Phone: [phone] Fax: [phone]  Is this the correct pharmacy for this prescription? Yes If no, delete pharmacy and type the correct one.   Has the prescription been filled recently? Yes  Is the patient out of the medication? Yes  Has the patient been seen for an appointment in the last year OR does the patient have an upcoming appointment? Yes  Can we respond through MyChart? Yes  Agent: Please be advised that Rx refills may take up to 3 business days. We ask that you follow-up with your pharmacy.

## 2023-05-21 NOTE — Telephone Encounter (Signed)
 Patient scheduled with laura 5/13

## 2023-05-21 NOTE — Telephone Encounter (Signed)
 Copied from CRM [id number]. Topic: Clinical - Order For Equipment >> May 21, 2023  9:49 AM Joan Wood wrote: Reason for CRM: Foot boot, and back brace request via [facility] fax number is [phone]

## 2023-05-22 ENCOUNTER — Telehealth (INDEPENDENT_AMBULATORY_CARE_PROVIDER_SITE_OTHER)

## 2023-05-22 ENCOUNTER — Telehealth: Payer: Self-pay

## 2023-05-22 DIAGNOSIS — S92301D Fracture of unspecified metatarsal bone(s), right foot, subsequent encounter for fracture with routine healing: Secondary | ICD-10-CM

## 2023-05-22 DIAGNOSIS — S92344G Nondisplaced fracture of fourth metatarsal bone, right foot, subsequent encounter for fracture with delayed healing: Secondary | ICD-10-CM

## 2023-05-22 MED ORDER — HYDROCODONE-ACETAMINOPHEN 5-325 MG PO TABS: 1.0000 | ORAL_TABLET | Freq: Three times a day (TID) | ORAL | 0 refills | Status: DC | PRN

## 2023-05-22 NOTE — Telephone Encounter (Signed)
 Spoke to pt, informed her to have labs done this week. She voiced understanding.

## 2023-05-22 NOTE — Telephone Encounter (Signed)
 Copied from CRM [id number]. Topic: Clinical - Prescription Issue >> May 22, 2023 11:40 AM Oddis Bench wrote: Reason for CRM: Patient is calling was in hospital mon-fri last week she broke her toes on right foot bruised 3 middle ribs left side and fractured right ankle she is trying to get back brace and boot from [employer] and they need to have a script faxed, she is stating that the office has sent the script in the pharmacy and they said they didn't get it.

## 2023-05-22 NOTE — Progress Notes (Addendum)
 Virtual Visit via Video Note  I connected with Joan Wood on 05/31/23 at  8:40 AM EDT by a video enabled telemedicine application and verified that I am speaking with the correct person using two identifiers.  Location: Patient: home Provider: home   I discussed the limitations of evaluation and management by telemedicine and the availability of in person appointments. The patient expressed understanding and agreed to proceed.  History of Present Illness:    Observations/Objective:   Assessment and Plan:   Follow Up Instructions:    I discussed the assessment and treatment plan with the patient. The patient was provided an opportunity to ask questions and all were answered. The patient agreed with the plan and demonstrated an understanding of the instructions.   The patient was advised to call back or seek an in-person evaluation if the symptoms worsen or if the condition fails to improve as anticipated.  I provided 15 minutes of non-face-to-face time during this encounter.   Alison Irvine, FNP    Established Patient Office Visit  Subjective   Patient ID: Joan Wood, female    DOB: 05/24/1970  Age: 53 y.o. MRN: [id number]  Chief Complaint  Patient presents with   Labs Only    Abnormal Labs   Nutrition Counseling    Diet questions    HPI Pain  She reports since fx on 04/15/23 right foot pain. was an injury that may have caused the pain. The pain started about a month ago and is staying constant. The pain does not radiate . The pain is described as throbbing, is moderate in intensity, occurring constantly. Symptoms are worse in the: mid-day  Aggravating factors: walking Relieving factors: none.  She has tried application of ice, acetaminophen , NSAIDs, and prescription pain relievers with mild relief.   ---------------------------------------------------------------------------------------------------  Patient Active Problem List   Diagnosis Date Noted    Enteritis 05/28/2023   Intractable vomiting 05/28/2023   PUD (peptic ulcer disease) 05/27/2023   Alcoholic cirrhosis of liver with ascites (HCC) 05/27/2023   Hypomagnesemia 05/17/2023   Hyponatremia 05/15/2023   Tobacco abuse 05/15/2023   Acute hyponatremia 05/11/2023   Pancreatitis 05/11/2023   Gastritis 05/11/2023   Hypokalemia 05/11/2023   Multiple closed fractures of metatarsal bone with routine healing, right 04/24/2023   Neuropathy 03/31/2023   Irritable bowel syndrome with diarrhea 03/31/2023   GERD (gastroesophageal reflux disease) 03/31/2023   Nausea and vomiting 03/31/2023   Alcohol use disorder 03/28/2022   Left hip pain 03/28/2022   Periumbilical hernia 03/28/2022   Adjustment disorder with mixed anxiety and depressed mood 12/15/2014   Malingering 12/14/2014   Past Medical History:  Diagnosis Date   Anxiety    GERD (gastroesophageal reflux disease)    Hernia of abdominal wall    HTN (hypertension)    SVT (supraventricular tachycardia) (HCC)       ROS    Objective:     There were no vitals taken for this visit. BP Readings from Last 3 Encounters:  05/29/23 123/77  05/25/23 127/82  05/18/23 (!) 97/58   Wt Readings from Last 3 Encounters:  05/27/23 120 lb 8 oz (54.7 kg)  05/15/23 116 lb 9.6 oz (52.9 kg)  05/12/23 123 lb 14.4 oz (56.2 kg)      Physical Exam Constitutional:      Appearance: Normal appearance.  Neurological:     Mental Status: She is alert and oriented to person, place, and time.  Psychiatric:        Mood  and Affect: Mood normal.        Thought Content: Thought content normal.      No results found for any visits on 05/22/23.  Last CBC Lab Results  Component Value Date   WBC 3.6 (L) 05/29/2023   HGB 8.3 (L) 05/29/2023   HCT 24.8 (L) 05/29/2023   MCV 100.0 05/29/2023   MCH 33.5 05/29/2023   RDW 14.6 05/29/2023   PLT 188 05/29/2023   Last metabolic panel Lab Results  Component Value Date   GLUCOSE 87 05/29/2023   NA  129 (L) 05/29/2023   K 3.7 05/29/2023   CL 98 05/29/2023   CO2 26 05/29/2023   BUN <5 (L) 05/29/2023   CREATININE 0.38 (L) 05/29/2023   GFRNONAA >60 05/29/2023   CALCIUM  8.3 (L) 05/29/2023   PROT 6.3 (L) 05/29/2023   ALBUMIN 2.5 (L) 05/29/2023   LABGLOB 4.2 04/11/2023   AGRATIO 1.2 03/28/2022   BILITOT 0.9 05/29/2023   ALKPHOS 68 05/29/2023   AST 56 (H) 05/29/2023   ALT 19 05/29/2023   ANIONGAP 5 05/29/2023   Last lipids Lab Results  Component Value Date   CHOL 177 04/03/2023   HDL 88 04/03/2023   LDLCALC 75 04/03/2023   TRIG 73 04/03/2023   CHOLHDL 2.0 04/03/2023   Last hemoglobin A1c Lab Results  Component Value Date   HGBA1C 4.7 (L) 04/03/2023   Last thyroid  functions Lab Results  Component Value Date   TSH 2.575 05/12/2023   Last vitamin D  Lab Results  Component Value Date   VD25OH 21.7 (L) 04/03/2023   Last vitamin B12 and Folate Lab Results  Component Value Date   VITAMINB12 704 05/12/2023   FOLATE 13.6 05/12/2023      The 10-year ASCVD risk score (Arnett DK, et al., 2019) is: 2.1%    Assessment & Plan:   Problem List Items Addressed This Visit       Musculoskeletal and Integument   Multiple closed fractures of metatarsal bone with routine healing, right - Primary   Other Visit Diagnoses       Closed nondisplaced fracture of fourth metatarsal bone of right foot with delayed healing, subsequent encounter           No follow-ups on file.    Alison Irvine, FNP

## 2023-05-23 LAB — SURGICAL PATHOLOGY

## 2023-05-24 ENCOUNTER — Telehealth: Payer: Self-pay | Admitting: Family Medicine

## 2023-05-24 ENCOUNTER — Inpatient Hospital Stay: Payer: Self-pay

## 2023-05-24 NOTE — Telephone Encounter (Signed)
 Copied from CRM [id number]. Topic: General - Other >> May 24, 2023 10:21 AM Kevelyn M wrote: Reason for CRM: Patient calling in for prescription to be sent [facility] for the boot. [phone] Fax # [facility].

## 2023-05-24 NOTE — Telephone Encounter (Signed)
 Copied from CRM [id number]. Topic: Referral - Request for Referral >> May 24, 2023 10:39 AM Kevelyn M wrote: Did the patient discuss referral with their provider in the last year? Yes (If No - schedule appointment) (If Yes - send message)  Appointment offered? No  Type of order/referral and detailed reason for visit: Patient would like to be referred to a Gastrologist again  Preference of office, provider, location:   If referral order, have you been seen by this specialty before? No (If Yes, this issue or another issue? When? Where?  Can we respond through MyChart? No

## 2023-05-24 NOTE — Telephone Encounter (Signed)
 Please advise, patient calling back.

## 2023-05-25 ENCOUNTER — Encounter: Payer: Self-pay | Admitting: Emergency Medicine

## 2023-05-25 ENCOUNTER — Ambulatory Visit: Admission: EM | Admit: 2023-05-25 | Discharge: 2023-05-25 | Disposition: A | Discharge status: Home / Self Care

## 2023-05-25 ENCOUNTER — Other Ambulatory Visit: Payer: Self-pay

## 2023-05-25 DIAGNOSIS — R0602 Shortness of breath: Secondary | ICD-10-CM | POA: Diagnosis not present

## 2023-05-25 DIAGNOSIS — R140 Abdominal distension (gaseous): Secondary | ICD-10-CM

## 2023-05-25 DIAGNOSIS — K7460 Unspecified cirrhosis of liver: Secondary | ICD-10-CM | POA: Diagnosis not present

## 2023-05-25 MED ORDER — FITRITE BACK BRACE WITH PULLEY MISC: 1.0000 [IU] | Freq: Every day | 0 refills | Status: AC

## 2023-05-25 MED ORDER — MISC. DEVICES MISC: 0 refills | Status: DC

## 2023-05-25 NOTE — Telephone Encounter (Signed)
 Her referral is still in good standing

## 2023-05-25 NOTE — Discharge Instructions (Addendum)
 Go to the emergency department for further evaluation.  We do not recommend that you drive yourself

## 2023-05-25 NOTE — ED Notes (Signed)
 Patient is being discharged from the Urgent Care and sent to the Emergency Department via POV . Per provider, patient is in need of higher level of care due to epigastric pain and SHOB. Patient is aware and verbalizes understanding of plan of care.  Vitals:   05/25/23 1703  BP: 127/82  Pulse: (!) 104  Resp: 18  Temp: 98.6 F (37 C)  SpO2: 97%

## 2023-05-25 NOTE — ED Triage Notes (Signed)
 States stomach is swollen 2 to 3 weeks now.  States she is unable to eat much because she feels full.

## 2023-05-27 ENCOUNTER — Inpatient Hospital Stay (HOSPITAL_COMMUNITY)
Admission: EM | Admit: 2023-05-27 | Discharge: 2023-05-29 | DRG: 368 | Disposition: A | Discharge status: Home / Self Care | Attending: Internal Medicine | Admitting: Internal Medicine

## 2023-05-27 ENCOUNTER — Other Ambulatory Visit: Payer: Self-pay

## 2023-05-27 ENCOUNTER — Encounter (HOSPITAL_COMMUNITY): Payer: Self-pay

## 2023-05-27 ENCOUNTER — Emergency Department (HOSPITAL_COMMUNITY)

## 2023-05-27 DIAGNOSIS — Z91030 Bee allergy status: Secondary | ICD-10-CM

## 2023-05-27 DIAGNOSIS — E871 Hypo-osmolality and hyponatremia: Secondary | ICD-10-CM | POA: Diagnosis not present

## 2023-05-27 DIAGNOSIS — R1084 Generalized abdominal pain: Secondary | ICD-10-CM

## 2023-05-27 DIAGNOSIS — F17210 Nicotine dependence, cigarettes, uncomplicated: Secondary | ICD-10-CM | POA: Diagnosis present

## 2023-05-27 DIAGNOSIS — R110 Nausea: Secondary | ICD-10-CM | POA: Diagnosis not present

## 2023-05-27 DIAGNOSIS — Z91040 Latex allergy status: Secondary | ICD-10-CM

## 2023-05-27 DIAGNOSIS — K279 Peptic ulcer, site unspecified, unspecified as acute or chronic, without hemorrhage or perforation: Secondary | ICD-10-CM | POA: Diagnosis not present

## 2023-05-27 DIAGNOSIS — E869 Volume depletion, unspecified: Secondary | ICD-10-CM | POA: Diagnosis present

## 2023-05-27 DIAGNOSIS — G8929 Other chronic pain: Secondary | ICD-10-CM | POA: Diagnosis present

## 2023-05-27 DIAGNOSIS — Z1152 Encounter for screening for COVID-19: Secondary | ICD-10-CM

## 2023-05-27 DIAGNOSIS — Z720 Tobacco use: Secondary | ICD-10-CM | POA: Diagnosis present

## 2023-05-27 DIAGNOSIS — Z91148 Patient's other noncompliance with medication regimen for other reason: Secondary | ICD-10-CM

## 2023-05-27 DIAGNOSIS — R160 Hepatomegaly, not elsewhere classified: Secondary | ICD-10-CM | POA: Diagnosis not present

## 2023-05-27 DIAGNOSIS — Z791 Long term (current) use of non-steroidal anti-inflammatories (NSAID): Secondary | ICD-10-CM

## 2023-05-27 DIAGNOSIS — K7030 Alcoholic cirrhosis of liver without ascites: Secondary | ICD-10-CM | POA: Diagnosis present

## 2023-05-27 DIAGNOSIS — K580 Irritable bowel syndrome with diarrhea: Secondary | ICD-10-CM | POA: Diagnosis present

## 2023-05-27 DIAGNOSIS — Z79899 Other long term (current) drug therapy: Secondary | ICD-10-CM

## 2023-05-27 DIAGNOSIS — K429 Umbilical hernia without obstruction or gangrene: Secondary | ICD-10-CM | POA: Diagnosis not present

## 2023-05-27 DIAGNOSIS — E876 Hypokalemia: Secondary | ICD-10-CM | POA: Diagnosis present

## 2023-05-27 DIAGNOSIS — K529 Noninfective gastroenteritis and colitis, unspecified: Secondary | ICD-10-CM

## 2023-05-27 DIAGNOSIS — I10 Essential (primary) hypertension: Secondary | ICD-10-CM | POA: Diagnosis present

## 2023-05-27 DIAGNOSIS — R112 Nausea with vomiting, unspecified: Secondary | ICD-10-CM | POA: Diagnosis not present

## 2023-05-27 DIAGNOSIS — R197 Diarrhea, unspecified: Secondary | ICD-10-CM | POA: Diagnosis present

## 2023-05-27 DIAGNOSIS — F1090 Alcohol use, unspecified, uncomplicated: Secondary | ICD-10-CM | POA: Diagnosis present

## 2023-05-27 DIAGNOSIS — K7031 Alcoholic cirrhosis of liver with ascites: Secondary | ICD-10-CM | POA: Diagnosis present

## 2023-05-27 DIAGNOSIS — R1110 Vomiting, unspecified: Secondary | ICD-10-CM | POA: Diagnosis present

## 2023-05-27 DIAGNOSIS — F1010 Alcohol abuse, uncomplicated: Secondary | ICD-10-CM | POA: Diagnosis present

## 2023-05-27 DIAGNOSIS — K7689 Other specified diseases of liver: Secondary | ICD-10-CM | POA: Diagnosis not present

## 2023-05-27 DIAGNOSIS — K275 Chronic or unspecified peptic ulcer, site unspecified, with perforation: Secondary | ICD-10-CM | POA: Diagnosis present

## 2023-05-27 DIAGNOSIS — R1111 Vomiting without nausea: Secondary | ICD-10-CM | POA: Diagnosis not present

## 2023-05-27 DIAGNOSIS — K254 Chronic or unspecified gastric ulcer with hemorrhage: Secondary | ICD-10-CM | POA: Diagnosis present

## 2023-05-27 DIAGNOSIS — K2101 Gastro-esophageal reflux disease with esophagitis, with bleeding: Principal | ICD-10-CM | POA: Diagnosis present

## 2023-05-27 LAB — CBC WITH DIFFERENTIAL/PLATELET
Abs Immature Granulocytes: 0.04 10*3/uL (ref 0.00–0.07)
Basophils Absolute: 0 10*3/uL (ref 0.0–0.1)
Basophils Relative: 1 %
Eosinophils Absolute: 0 10*3/uL (ref 0.0–0.5)
Eosinophils Relative: 1 %
HCT: 28.2 % — ABNORMAL LOW (ref 36.0–46.0)
Hemoglobin: 9.3 g/dL — ABNORMAL LOW (ref 12.0–15.0)
Immature Granulocytes: 1 %
Lymphocytes Relative: 25 %
Lymphs Abs: 2.2 10*3/uL (ref 0.7–4.0)
MCH: 32.3 pg (ref 26.0–34.0)
MCHC: 33 g/dL (ref 30.0–36.0)
MCV: 97.9 fL (ref 80.0–100.0)
Monocytes Absolute: 0.7 10*3/uL (ref 0.1–1.0)
Monocytes Relative: 8 %
Neutro Abs: 5.7 10*3/uL (ref 1.7–7.7)
Neutrophils Relative %: 64 %
Platelets: 229 10*3/uL (ref 150–400)
RBC: 2.88 MIL/uL — ABNORMAL LOW (ref 3.87–5.11)
RDW: 14.1 % (ref 11.5–15.5)
WBC: 8.7 10*3/uL (ref 4.0–10.5)
nRBC: 0 % (ref 0.0–0.2)

## 2023-05-27 LAB — COMPREHENSIVE METABOLIC PANEL WITH GFR
ALT: 23 U/L (ref 0–44)
AST: 67 U/L — ABNORMAL HIGH (ref 15–41)
Albumin: 2.8 g/dL — ABNORMAL LOW (ref 3.5–5.0)
Alkaline Phosphatase: 80 U/L (ref 38–126)
Anion gap: 9 (ref 5–15)
BUN: <5 mg/dL — ABNORMAL LOW (ref 6–20)
CO2: 21 mmol/L — ABNORMAL LOW (ref 22–32)
Calcium: 7.8 mg/dL — ABNORMAL LOW (ref 8.9–10.3)
Chloride: 91 mmol/L — ABNORMAL LOW (ref 98–111)
Creatinine, Ser: 0.34 mg/dL — ABNORMAL LOW (ref 0.44–1.00)
GFR, Estimated: >60 mL/min (ref >60)
Glucose, Bld: 83 mg/dL (ref 70–99)
Potassium: 3.2 mmol/L — ABNORMAL LOW (ref 3.5–5.1)
Sodium: 121 mmol/L — ABNORMAL LOW (ref 135–145)
Total Bilirubin: 1 mg/dL (ref 0.0–1.2)
Total Protein: 7.2 g/dL (ref 6.5–8.1)

## 2023-05-27 LAB — URINALYSIS, ROUTINE W REFLEX MICROSCOPIC
Bacteria, UA: NONE SEEN
Bilirubin Urine: NEGATIVE
Glucose, UA: NEGATIVE mg/dL
Ketones, ur: NEGATIVE mg/dL
Leukocytes,Ua: NEGATIVE
Nitrite: NEGATIVE
Protein, ur: NEGATIVE mg/dL
Specific Gravity, Urine: 1.004 — ABNORMAL LOW (ref 1.005–1.030)
pH: 6 (ref 5.0–8.0)

## 2023-05-27 LAB — MRSA NEXT GEN BY PCR, NASAL: MRSA by PCR Next Gen: NOT DETECTED

## 2023-05-27 LAB — MAGNESIUM: Magnesium: 1.7 mg/dL (ref 1.7–2.4)

## 2023-05-27 LAB — LIPASE, BLOOD: Lipase: 74 U/L — ABNORMAL HIGH (ref 11–51)

## 2023-05-27 MED ORDER — ONDANSETRON HCL 4 MG/2ML IJ SOLN
4.0000 mg | Freq: Once | INTRAMUSCULAR | Status: AC
  Administered 2023-05-27: 4 mg via INTRAVENOUS
  Filled 2023-05-27: qty 2

## 2023-05-27 MED ORDER — ONDANSETRON HCL 4 MG PO TABS
4.0000 mg | ORAL_TABLET | Freq: Four times a day (QID) | ORAL | Status: DC | PRN
Start: 2023-05-27 — End: 2023-05-29

## 2023-05-27 MED ORDER — IOHEXOL 300 MG/ML  SOLN
100.0000 mL | Freq: Once | INTRAMUSCULAR | Status: AC | PRN
  Administered 2023-05-27: 100 mL via INTRAVENOUS

## 2023-05-27 MED ORDER — POTASSIUM CHLORIDE IN NACL 20-0.9 MEQ/L-% IV SOLN
INTRAVENOUS | Status: DC
Start: 2023-05-27 — End: 2023-05-28

## 2023-05-27 MED ORDER — POLYETHYLENE GLYCOL 3350 17 G PO PACK: 17.0000 g | PACK | Freq: Every day | ORAL | Status: DC | PRN

## 2023-05-27 MED ORDER — FOLIC ACID 1 MG PO TABS
1.0000 mg | ORAL_TABLET | Freq: Every day | ORAL | Status: DC
  Administered 2023-05-27 – 2023-05-29 (×3): 1 mg via ORAL
  Filled 2023-05-27 (×3): qty 1

## 2023-05-27 MED ORDER — ADULT MULTIVITAMIN W/MINERALS CH
1.0000 | ORAL_TABLET | Freq: Every day | ORAL | Status: DC
  Administered 2023-05-27 – 2023-05-29 (×3): 1 via ORAL
  Filled 2023-05-27 (×3): qty 1

## 2023-05-27 MED ORDER — LORAZEPAM 2 MG/ML IJ SOLN: 1.0000 mg | INTRAMUSCULAR | Status: DC | PRN

## 2023-05-27 MED ORDER — PANTOPRAZOLE SODIUM 40 MG IV SOLR
40.0000 mg | Freq: Two times a day (BID) | INTRAVENOUS | Status: DC
  Administered 2023-05-28 – 2023-05-29 (×3): 40 mg via INTRAVENOUS
  Filled 2023-05-27 (×3): qty 10

## 2023-05-27 MED ORDER — THIAMINE HCL 100 MG/ML IJ SOLN: 100.0000 mg | Freq: Every day | INTRAMUSCULAR | Status: DC

## 2023-05-27 MED ORDER — ACETAMINOPHEN 500 MG PO TABS
500.0000 mg | ORAL_TABLET | Freq: Every evening | ORAL | Status: DC | PRN
  Administered 2023-05-27: 500 mg via ORAL
  Filled 2023-05-27: qty 1

## 2023-05-27 MED ORDER — DIPHENHYDRAMINE-APAP (SLEEP) 25-500 MG PO TABS: 1.0000 | ORAL_TABLET | Freq: Every evening | ORAL | Status: DC | PRN

## 2023-05-27 MED ORDER — MORPHINE SULFATE (PF) 4 MG/ML IV SOLN
4.0000 mg | Freq: Once | INTRAVENOUS | Status: AC
  Administered 2023-05-27: 4 mg via INTRAVENOUS
  Filled 2023-05-27: qty 1

## 2023-05-27 MED ORDER — LORAZEPAM 1 MG PO TABS: 1.0000 mg | ORAL_TABLET | ORAL | Status: DC | PRN

## 2023-05-27 MED ORDER — PANTOPRAZOLE SODIUM 40 MG IV SOLR
40.0000 mg | Freq: Once | INTRAVENOUS | Status: AC
  Administered 2023-05-27: 40 mg via INTRAVENOUS
  Filled 2023-05-27: qty 10

## 2023-05-27 MED ORDER — POTASSIUM CHLORIDE CRYS ER 20 MEQ PO TBCR
40.0000 meq | EXTENDED_RELEASE_TABLET | Freq: Once | ORAL | Status: AC
  Administered 2023-05-27: 40 meq via ORAL
  Filled 2023-05-27: qty 2

## 2023-05-27 MED ORDER — MORPHINE SULFATE (PF) 2 MG/ML IV SOLN
2.0000 mg | INTRAVENOUS | Status: DC | PRN
  Administered 2023-05-28 – 2023-05-29 (×6): 2 mg via INTRAVENOUS
  Filled 2023-05-27 (×6): qty 1

## 2023-05-27 MED ORDER — CHLORHEXIDINE GLUCONATE CLOTH 2 % EX PADS
6.0000 | MEDICATED_PAD | Freq: Every day | CUTANEOUS | Status: DC
  Administered 2023-05-27 – 2023-05-29 (×3): 6 via TOPICAL

## 2023-05-27 MED ORDER — ONDANSETRON HCL 4 MG/2ML IJ SOLN
4.0000 mg | Freq: Four times a day (QID) | INTRAMUSCULAR | Status: DC | PRN
  Administered 2023-05-28 – 2023-05-29 (×5): 4 mg via INTRAVENOUS
  Filled 2023-05-27 (×5): qty 2

## 2023-05-27 MED ORDER — SODIUM CHLORIDE 0.9 % IV BOLUS
1000.0000 mL | Freq: Once | INTRAVENOUS | Status: AC
  Administered 2023-05-27: 1000 mL via INTRAVENOUS

## 2023-05-27 MED ORDER — SUCRALFATE 1 G PO TABS
1.0000 g | ORAL_TABLET | Freq: Three times a day (TID) | ORAL | Status: DC
  Administered 2023-05-27 – 2023-05-29 (×7): 1 g via ORAL
  Filled 2023-05-27 (×7): qty 1

## 2023-05-27 MED ORDER — DIPHENHYDRAMINE HCL 25 MG PO CAPS
25.0000 mg | ORAL_CAPSULE | Freq: Every evening | ORAL | Status: DC | PRN
  Administered 2023-05-27 – 2023-05-28 (×2): 25 mg via ORAL
  Filled 2023-05-27 (×2): qty 1

## 2023-05-27 MED ORDER — THIAMINE MONONITRATE 100 MG PO TABS
100.0000 mg | ORAL_TABLET | Freq: Every day | ORAL | Status: DC
  Administered 2023-05-27 – 2023-05-29 (×3): 100 mg via ORAL
  Filled 2023-05-27 (×4): qty 1

## 2023-05-27 NOTE — Assessment & Plan Note (Addendum)
 Abdomen distended but soft.  No prior paracentesis- doubt she is at the point of needing paracentesis at this time.  No other signs of volume overload.  CT-small amount of free fluid in abdomen and pelvis.Ongoing alcohol abuse, she reports she has cut down to 2 beers 2 days a week.  Last alcoholic beverage was this morning at about 4 AM.  No sign of withdrawal at this time. - CIWA as needed - Thiamine , folate, multivitamins -Check Phos

## 2023-05-27 NOTE — Assessment & Plan Note (Signed)
 K-  3.2.  Magnesium  1.7.  In the setting of chronic alcohol abuse, gastroenteritis. - replete

## 2023-05-27 NOTE — Assessment & Plan Note (Addendum)
 Sodium 121, mental status intact.  Presents with nausea and vomiting.  Recent admission for same, sodium down to 118.  Likely secondary to beer potomania, GI losses over the past several days. -1 L normal saline given, continue N/s + 20 kcl 100cc/hr

## 2023-05-27 NOTE — ED Triage Notes (Signed)
 Pt states abdominal pain has gotten worse, states she's vomited 3X today, not able to keep any food down.No appetite. No blood thinners. Pt states she was recently diagnosed with Cirrhosis.

## 2023-05-27 NOTE — Assessment & Plan Note (Signed)
 Reports she is working on quitting cigarette smoking.

## 2023-05-27 NOTE — H&P (Addendum)
 History and Physical    Joan Wood ZOX:[id number] DOB: [birth year] DOA: 05/27/2023  PCP: Zarwolo, Gloria, FNP   Patient coming from: Home  I have personally briefly reviewed patient's old medical records in [facility]  Chief Complaint: Abdominal Pain  HPI: Joan Wood is a 53 y.o. female with medical history significant for alcohol abuse, hypertension, tobacco abuse, anxiety and depression, IBS, alcoholic liver cirrhosis. Patient presented to the ED with complaints of upper abdominal pain over the past month, with nausea vomiting and diarrhea.  She reports about 3 episodes of vomiting daily, reports multiple loose stools.  She reports black tarry stools, recent episode today. She reports gradual abdominal distention, she has never had paracentesis.  She denies NSAID use.  Recent hospitalization 5/3 to 5/5, patient left AMA, and was back in the ED and readmitted 5/6 to 5/9 for hyponatremia, hypomagnesemia, hypokalemia, and acute anemia.  She underwent EGD with Dr. Mordechai April with findings of esophageal varices, nonbleeding gastric ulcers and esophagitis. Patient was to take Protonix  twice daily, through Carafate  4 times daily on discharge.  Sodium was down to 118-  5/3, and improved to 130- 5/9.  ED Course: Stable vitals.  Temperature 98.2.  Heart rate 89-103.  Respirate rate 19-20, blood pressure systolic 101-140.  O2 sat greater than 97% on room air. Sodium 121, potassium 3.3. CT abdomen pelvis with contrast-against findings worrisome for peptic ulcer disease/gastritis, prominent appendix with no surrounding inflammation-correlate for early acute appendicitis, enteritis, free fluid throughout abdomen and pelvis, also findings worrisome for liver cirrhosis. 1 L normal saline bolus given. Hospitalist to admit for hyponatremia.  Review of Systems: As per HPI all other systems reviewed and negative.  Past Medical History:  Diagnosis Date   Anxiety    GERD (gastroesophageal reflux  disease)    Hernia of abdominal wall    HTN (hypertension)    SVT (supraventricular tachycardia) (HCC)     Past Surgical History:  Procedure Laterality Date   ESOPHAGOGASTRODUODENOSCOPY N/A 05/17/2023   Procedure: EGD (ESOPHAGOGASTRODUODENOSCOPY);  Surgeon: Vinetta Greening, DO;  Location: [facility];  Service: Endoscopy;  Laterality: N/A;   FINGER FRACTURE SURGERY     right ankle repair     age 31     reports that she has been smoking cigarettes. She started smoking about 14 months ago. She has a 28.4 pack-year smoking history. She has never used smokeless tobacco. She reports current alcohol use. She reports that she does not use drugs.  Allergies  Allergen Reactions   Bee Venom Anaphylaxis    Cannot take epi for this allergy d/t heart condition per pt   Latex Rash    Family History  Problem Relation Age of Onset   Cancer Mother    Leukemia Mother    Multiple myeloma Mother    Cancer Father        unsure what kind   Cancer - Colon Neg Hx    Colon polyps Neg Hx     Prior to Admission medications   Medication Sig Start Date End Date Taking? Authorizing Provider  acetaminophen  (TYLENOL ) 500 MG tablet Take 500 mg by mouth every 6 (six) hours as needed for moderate pain (pain score 4-6).    [provider]  bismuth subsalicylate (PEPTO BISMOL) 262 MG/15ML suspension Take 30 mLs by mouth every 6 (six) hours as needed for diarrhea or loose stools.    [provider]  diphenhydramine-acetaminophen  (TYLENOL  PM) 25-500 MG TABS tablet Take 1 tablet by  mouth at bedtime as needed (pain/sleep).    [provider]  Elastic Bandages & Supports (FITRITE BACK BRACE WITH PULLEY) MISC 1 Units by Does not apply route daily. 05/25/23   Alison Irvine, FNP  folic acid  (FOLVITE ) 1 MG tablet Take 1 tablet (1 mg total) by mouth daily. 03/29/23   Zarwolo, Gloria, FNP  gabapentin  (NEURONTIN ) 100 MG capsule Take 100 mg by mouth daily as needed (nerve pain).    [provider]  HYDROcodone -acetaminophen  (NORCO/VICODIN) 5-325 MG tablet Take 1 tablet by mouth every 8 (eight) hours as needed for up to 5 days for moderate pain (pain score 4-6). 05/22/23 05/27/23  Alison Irvine, FNP  meloxicam  (MOBIC ) 15 MG tablet Take 1 tablet (15 mg total) by mouth daily. 05/21/23   Zarwolo, Gloria, FNP  Misc. Devices MISC Closed toe walking boot for right foot toe fractures Dx: Z61.096 05/25/23   Alison Irvine, FNP  omeprazole  (PRILOSEC) 40 MG capsule Take 1 capsule (40 mg total) by mouth daily. 04/11/23   Eustacio Highman, NP  ondansetron  (ZOFRAN ) 4 MG tablet Take 1 tablet (4 mg total) by mouth 2 (two) times daily as needed for nausea or vomiting. 04/23/23   Alison Irvine, FNP  pyridOXINE (VITAMIN B6) 50 MG tablet Take 1 tablet (50 mg total) by mouth daily. 03/29/23   Zarwolo, Gloria, FNP  Vitamin D , Ergocalciferol , (DRISDOL ) 1.25 MG (50000 UNIT) CAPS capsule Take 1 capsule (50,000 Units total) by mouth every 7 (seven) days. 04/15/23   Zarwolo, Gloria, FNP    Physical Exam: Vitals:   05/27/23 1741 05/27/23 1800 05/27/23 1815 05/27/23 1900  BP: 101/63 110/83 120/74 120/74  Pulse:  91 89 89  Resp: 20   20  Temp:    98.3 F (36.8 C)  TempSrc:    Oral  SpO2: 97% 97% 97% 97%  Weight:      Height:        Constitutional: NAD, calm, comfortable Vitals:   05/27/23 1741 05/27/23 1800 05/27/23 1815 05/27/23 1900  BP: 101/63 110/83 120/74 120/74  Pulse:  91 89 89  Resp: 20   20  Temp:    98.3 F (36.8 C)  TempSrc:    Oral  SpO2: 97% 97% 97% 97%  Weight:      Height:       Eyes: PERRL, lids and conjunctivae normal ENMT: Mucous membranes are moist.  Neck: normal, supple, no masses, no thyromegaly Respiratory: clear to auscultation bilaterally, no wheezing, no crackles. Normal respiratory effort. No accessory muscle use.  Cardiovascular: Regular rate and rhythm, no murmurs / rubs / gallops. No extremity edema.  Extremities warm. Abdomen: Mild epigastric tenderness,  abdomen distended, but soft, no tenderness, no masses palpated. No hepatosplenomegaly.   Musculoskeletal: no clubbing / cyanosis. No joint deformity upper and lower extremities. Skin: no rashes, lesions, ulcers. No induration Neurologic: No facial asymmetry, moving all extremities spontaneously, speech fluent Psychiatric: Normal judgment and insight. Alert and oriented x 3. Normal mood.   Labs on Admission: I have personally reviewed following labs and imaging studies  CBC: Recent Labs  Lab 05/27/23 1419  WBC 8.7  NEUTROABS 5.7  HGB 9.3*  HCT 28.2*  MCV 97.9  PLT 229   Basic Metabolic Panel: Recent Labs  Lab 05/27/23 1419  NA 121*  K 3.2*  CL 91*  CO2 21*  GLUCOSE 83  BUN <5*  CREATININE 0.34*  CALCIUM  7.8*  MG 1.7   GFR: Estimated Creatinine Clearance: 64.3 mL/min (A) (by C-G  formula based on SCr of 0.34 mg/dL (L)). Liver Function Tests: Recent Labs  Lab 05/27/23 1419  AST 67*  ALT 23  ALKPHOS 80  BILITOT 1.0  PROT 7.2  ALBUMIN 2.8*   Recent Labs  Lab 05/27/23 1419  LIPASE 74*   Urine analysis:    Component Value Date/Time   COLORURINE YELLOW 05/27/2023 1404   APPEARANCEUR CLEAR 05/27/2023 1404   LABSPEC 1.004 (L) 05/27/2023 1404   PHURINE 6.0 05/27/2023 1404   GLUCOSEU NEGATIVE 05/27/2023 1404   HGBUR SMALL (A) 05/27/2023 1404   BILIRUBINUR NEGATIVE 05/27/2023 1404   KETONESUR NEGATIVE 05/27/2023 1404   PROTEINUR NEGATIVE 05/27/2023 1404   NITRITE NEGATIVE 05/27/2023 1404   LEUKOCYTESUR NEGATIVE 05/27/2023 1404    Radiological Exams on Admission: CT ABDOMEN PELVIS W CONTRAST Result Date: 05/27/2023 CLINICAL DATA:  Acute abdominal pain EXAM: CT ABDOMEN AND PELVIS WITH CONTRAST TECHNIQUE: Multidetector CT imaging of the abdomen and pelvis was performed using the standard protocol following bolus administration of intravenous contrast. RADIATION DOSE REDUCTION: This exam was performed according to the departmental dose-optimization program which  includes automated exposure control, adjustment of the mA and/or kV according to patient size and/or use of iterative reconstruction technique. CONTRAST:  OMNIPAQUE  IOHEXOL  300 MG/ML  SOLN COMPARISON:  CT abdomen and pelvis 05/11/2023 FINDINGS: Lower chest: No acute abnormality. Hepatobiliary: The liver is enlarged with nodular liver contour similar to the prior study. There is a rounded hypodensity which is too small to characterize in the inferior right lobe of the liver which is unchanged. No new liver lesions are seen. Gallbladder and bile ducts are within normal limits. Pancreas: Unremarkable. No pancreatic ductal dilatation or surrounding inflammatory changes. Spleen: Normal in size without focal abnormality. Adrenals/Urinary Tract: Adrenal glands are unremarkable. Kidneys are normal, without renal calculi, focal lesion, or hydronephrosis. Bladder is unremarkable. Stomach/Bowel: The appendix is prominent in size measuring up to 1 cm. There is no surrounding inflammation. Is best seen on coronal image 4/51. There is no bowel obstruction, pneumatosis or free air. There is fluid distension of small bowel loops diffusely. There is wall thickening and edema of the distal gastric body and gastric antrum. There are findings worrisome for ulceration at the level of the gastric antrum along the anterior wall image 2/30. Vascular/Lymphatic: Portal vein and splenic vein appear patent. Aorta and IVC are normal in size. There are atherosclerotic calcifications of the aorta. There is recanalization of the umbilical vein with varices within an umbilical hernia similar to the prior study. No enlarged lymph nodes are seen. Reproductive: Uterus and bilateral adnexa are unremarkable. Other: There is a small amount of free fluid throughout the abdomen and pelvis. Musculoskeletal: There is grade 1 anterolisthesis at L4-L5 which is unchanged. Mild chronic appearing compression deformity of the superior endplate of T12 appears  similar to the prior study. IMPRESSION: 1. Wall thickening and edema of the distal gastric body and gastric antrum worrisome for gastritis/peptic ulcer disease. There are findings worrisome for ulceration at the level of the gastric antrum along the anterior wall. Endoscopy recommended. 2. Appendix is prominent in size measuring up to 1 cm. There is no surrounding inflammation. Please correlate clinically for early acute appendicitis. 3. Diffuse fluid distension of small bowel loops worrisome for enteritis. 4. Small amount of free fluid throughout the abdomen and pelvis. 5. Stable hepatomegaly with nodular liver contour worrisome for cirrhosis. 6. 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 7. Aortic atherosclerosis. Aortic Atherosclerosis (ICD10-I70.0). Electronically  Signed   By: Tyron Gallon M.D.   On: 05/27/2023 16:29   EKG: None.   Assessment/Plan Principal Problem:   Hyponatremia Active Problems:   Hypokalemia   Irritable bowel syndrome with diarrhea   PUD (peptic ulcer disease)   Tobacco abuse   Alcohol use disorder   Alcoholic cirrhosis of liver with ascites (HCC)  Assessment and Plan: * Hyponatremia Sodium 121, mental status intact.  Presents with nausea and vomiting.  Recent admission for same, sodium down to 118.  Likely secondary to beer potomania, GI losses over the past several days. -1 L normal saline given, continue N/s + 20 kcl 100cc/hr   Hypokalemia K-  3.2.  Magnesium  1.7.  In the setting of chronic alcohol abuse, gastroenteritis. - replete  PUD (peptic ulcer disease) Reports ongoing melena.  Hgb stable, likely some hemoconcentration.  Recent EGD 05/17/2023- by Dr. Mordechai April, with findings of esophageal varices, nonbleeding gastric ulcers and esophagitis..  Meloxicam  on med list, positive alcohol abuse.  -IV Protonix  40 twice daily -Sucralfate  4 times daily  Irritable bowel syndrome with diarrhea Presenting with vomiting and  diarrhea, abdominal pain.  CT with findings of enteritis, peptic ulcer disease, prominent appendix without inflammation,-correlate for early acute appendicitis.  Abdominal pain is epigastric in location.  WBC 8.7.  Afebrile. -Stool C. Difficile - hydrate. - Clear liquid diet, advance diet as tolerated.  Tobacco abuse Reports she is working on quitting cigarette smoking.  Alcoholic cirrhosis of liver with ascites (HCC) Abdomen distended but soft.  No prior paracentesis- doubt she is at the point of needing paracentesis at this time.  No other signs of volume overload.  CT-small amount of free fluid in abdomen and pelvis.Ongoing alcohol abuse, she reports she has cut down to 2 beers 2 days a week.  Last alcoholic beverage was this morning at about 4 AM.  No sign of withdrawal at this time. - CIWA as needed - Thiamine , folate, multivitamins -Check Phos   DVT prophylaxis: SCD Code Status: FULL code Family Communication: None at bedside Disposition Plan: > 2 days Consults called: None  Admission status: Obs Stepdown   Author: Pati Bonine, MD 05/27/2023 8:34 PM  For on call review [email or web address].

## 2023-05-27 NOTE — Plan of Care (Signed)

## 2023-05-27 NOTE — ED Provider Notes (Signed)
 [place] EMERGENCY DEPARTMENT AT [facility] Provider Note   CSN: [id number] Arrival date & time: 05/27/23  1307     History  Chief Complaint  Patient presents with   Abdominal Pain    Joan Wood is a 53 y.o. female.  Patient with history of alcohol abuse, GERD, hypertension presents today with complaints of abdominal pain. She states that same has been ongoing for the past few days. Endorses associated nausea, vomiting, and diarrhea. States she feels her abdomen is distended as well. She has never had a paracentesis. She does state that she is down to 2 beers twice a week. Denies any fevers or chills. No urinary symptoms. Was recently admitted for similar symptoms and had an EGD which showed gastric ulcers and esophagitis. Denies hematemesis, hematochezia, or melena.  The history is provided by the patient. No language interpreter was used.  Abdominal Pain Associated symptoms: diarrhea, nausea and vomiting        Home Medications Prior to Admission medications   Medication Sig Start Date End Date Taking? Authorizing Provider  acetaminophen  (TYLENOL ) 500 MG tablet Take 500 mg by mouth every 6 (six) hours as needed for moderate pain (pain score 4-6).    [provider]  bismuth subsalicylate (PEPTO BISMOL) 262 MG/15ML suspension Take 30 mLs by mouth every 6 (six) hours as needed for diarrhea or loose stools.    [provider]  diphenhydramine -acetaminophen  (TYLENOL  PM) 25-500 MG TABS tablet Take 1 tablet by mouth at bedtime as needed (pain/sleep).    [provider]  Elastic Bandages & Supports (FITRITE BACK BRACE WITH PULLEY) MISC 1 Units by Does not apply route daily. 05/25/23   Alison Irvine, FNP  folic acid  (FOLVITE ) 1 MG tablet Take 1 tablet (1 mg total) by mouth daily. 03/29/23   Zarwolo, Gloria, FNP  gabapentin  (NEURONTIN ) 100 MG capsule Take 100 mg by mouth daily as needed (nerve pain).    [provider]   HYDROcodone -acetaminophen  (NORCO/VICODIN) 5-325 MG tablet Take 1 tablet by mouth every 8 (eight) hours as needed for up to 5 days for moderate pain (pain score 4-6). 05/22/23 05/27/23  Alison Irvine, FNP  meloxicam  (MOBIC ) 15 MG tablet Take 1 tablet (15 mg total) by mouth daily. 05/21/23   Zarwolo, Gloria, FNP  Misc. Devices MISC Closed toe walking boot for right foot toe fractures Dx: X91.478 05/25/23   Alison Irvine, FNP  omeprazole  (PRILOSEC) 40 MG capsule Take 1 capsule (40 mg total) by mouth daily. 04/11/23   Eustacio Highman, NP  ondansetron  (ZOFRAN ) 4 MG tablet Take 1 tablet (4 mg total) by mouth 2 (two) times daily as needed for nausea or vomiting. 04/23/23   Alison Irvine, FNP  pyridOXINE (VITAMIN B6) 50 MG tablet Take 1 tablet (50 mg total) by mouth daily. 03/29/23   Zarwolo, Gloria, FNP  Vitamin D , Ergocalciferol , (DRISDOL ) 1.25 MG (50000 UNIT) CAPS capsule Take 1 capsule (50,000 Units total) by mouth every 7 (seven) days. 04/15/23   Zarwolo, Gloria, FNP      Allergies    Bee venom and Latex    Review of Systems   Review of Systems  Gastrointestinal:  Positive for abdominal pain, diarrhea, nausea and vomiting.  All other systems reviewed and are negative.   Physical Exam Updated Vital Signs BP (!) 140/81   Pulse (!) 103   Temp 98.2 F (36.8 C) (Oral)   Resp 19   Ht 5\' 2"  (1.575 m)   Wt 52.6 kg  SpO2 98%   BMI 21.22 kg/m  Physical Exam Vitals and nursing note reviewed.  Constitutional:      General: She is not in acute distress.    Appearance: Normal appearance. She is normal weight. She is not ill-appearing, toxic-appearing or diaphoretic.  HENT:     Head: Normocephalic and atraumatic.  Cardiovascular:     Rate and Rhythm: Normal rate.  Pulmonary:     Effort: Pulmonary effort is normal. No respiratory distress.  Abdominal:     General: Abdomen is flat.     Palpations: Abdomen is soft.     Tenderness: There is abdominal tenderness.     Hernia: A hernia is present.  Hernia is present in the umbilical area.     Comments: Reducible soft minimally tender umbilical hernia  Musculoskeletal:        General: Normal range of motion.     Cervical back: Normal range of motion.  Skin:    General: Skin is warm and dry.  Neurological:     General: No focal deficit present.     Mental Status: She is alert.  Psychiatric:        Mood and Affect: Mood normal.        Behavior: Behavior normal.     ED Results / Procedures / Treatments   Labs (all labs ordered are listed, but only abnormal results are displayed) Labs Reviewed  COMPREHENSIVE METABOLIC PANEL WITH GFR - Abnormal; Notable for the following components:      Result Value   Sodium 121 (*)    Potassium 3.2 (*)    Chloride 91 (*)    CO2 21 (*)    BUN <5 (*)    Creatinine, Ser 0.34 (*)    Calcium  7.8 (*)    Albumin 2.8 (*)    AST 67 (*)    All other components within normal limits  LIPASE, BLOOD - Abnormal; Notable for the following components:   Lipase 74 (*)    All other components within normal limits  CBC WITH DIFFERENTIAL/PLATELET - Abnormal; Notable for the following components:   RBC 2.88 (*)    Hemoglobin 9.3 (*)    HCT 28.2 (*)    All other components within normal limits  URINALYSIS, ROUTINE W REFLEX MICROSCOPIC - Abnormal; Notable for the following components:   Specific Gravity, Urine 1.004 (*)    Hgb urine dipstick SMALL (*)    All other components within normal limits  MAGNESIUM     EKG None  Radiology CT ABDOMEN PELVIS W CONTRAST Result Date: 05/27/2023 CLINICAL DATA:  Acute abdominal pain EXAM: CT ABDOMEN AND PELVIS WITH CONTRAST TECHNIQUE: Multidetector CT imaging of the abdomen and pelvis was performed using the standard protocol following bolus administration of intravenous contrast. RADIATION DOSE REDUCTION: This exam was performed according to the departmental dose-optimization program which includes automated exposure control, adjustment of the mA and/or kV according  to patient size and/or use of iterative reconstruction technique. CONTRAST:  OMNIPAQUE  IOHEXOL  300 MG/ML  SOLN COMPARISON:  CT abdomen and pelvis 05/11/2023 FINDINGS: Lower chest: No acute abnormality. Hepatobiliary: The liver is enlarged with nodular liver contour similar to the prior study. There is a rounded hypodensity which is too small to characterize in the inferior right lobe of the liver which is unchanged. No new liver lesions are seen. Gallbladder and bile ducts are within normal limits. Pancreas: Unremarkable. No pancreatic ductal dilatation or surrounding inflammatory changes. Spleen: Normal in size without focal abnormality. Adrenals/Urinary Tract: Adrenal  glands are unremarkable. Kidneys are normal, without renal calculi, focal lesion, or hydronephrosis. Bladder is unremarkable. Stomach/Bowel: The appendix is prominent in size measuring up to 1 cm. There is no surrounding inflammation. Is best seen on coronal image 4/51. There is no bowel obstruction, pneumatosis or free air. There is fluid distension of small bowel loops diffusely. There is wall thickening and edema of the distal gastric body and gastric antrum. There are findings worrisome for ulceration at the level of the gastric antrum along the anterior wall image 2/30. Vascular/Lymphatic: Portal vein and splenic vein appear patent. Aorta and IVC are normal in size. There are atherosclerotic calcifications of the aorta. There is recanalization of the umbilical vein with varices within an umbilical hernia similar to the prior study. No enlarged lymph nodes are seen. Reproductive: Uterus and bilateral adnexa are unremarkable. Other: There is a small amount of free fluid throughout the abdomen and pelvis. Musculoskeletal: There is grade 1 anterolisthesis at L4-L5 which is unchanged. Mild chronic appearing compression deformity of the superior endplate of T12 appears similar to the prior study. IMPRESSION: 1. Wall thickening and edema of the  distal gastric body and gastric antrum worrisome for gastritis/peptic ulcer disease. There are findings worrisome for ulceration at the level of the gastric antrum along the anterior wall. Endoscopy recommended. 2. Appendix is prominent in size measuring up to 1 cm. There is no surrounding inflammation. Please correlate clinically for early acute appendicitis. 3. Diffuse fluid distension of small bowel loops worrisome for enteritis. 4. Small amount of free fluid throughout the abdomen and pelvis. 5. Stable hepatomegaly with nodular liver contour worrisome for cirrhosis. 6. 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 7. Aortic atherosclerosis. Aortic Atherosclerosis (ICD10-I70.0). Electronically Signed   By: Tyron Gallon M.D.   On: 05/27/2023 16:29    Procedures Procedures    Medications Ordered in ED Medications  sodium chloride  0.9 % bolus 1,000 mL (has no administration in time range)  potassium chloride  SA (KLOR-CON  M) CR tablet 40 mEq (has no administration in time range)  morphine  (PF) 4 MG/ML injection 4 mg (4 mg Intravenous Given 05/27/23 1441)  ondansetron  (ZOFRAN ) injection 4 mg (4 mg Intravenous Given 05/27/23 1441)  iohexol  (OMNIPAQUE ) 300 MG/ML solution 100 mL (100 mLs Intravenous Contrast Given 05/27/23 1549)  pantoprazole  (PROTONIX ) injection 40 mg (40 mg Intravenous Given 05/27/23 1737)  morphine  (PF) 4 MG/ML injection 4 mg (4 mg Intravenous Given 05/27/23 1832)  ondansetron  (ZOFRAN ) injection 4 mg (4 mg Intravenous Given 05/27/23 1832)    ED Course/ Medical Decision Making/ A&P                                 Medical Decision Making Amount and/or Complexity of Data Reviewed Labs: ordered. Radiology: ordered.  Risk Prescription drug management.   This patient is a 53 y.o. female who presents to the ED for concern of abdominal pain, nausea, vomiting, diarrhea, this involves an extensive number of treatment options, and is a complaint  that carries with it a high risk of complications and morbidity. The emergent differential diagnosis prior to evaluation includes, but is not limited to,  The differential diagnosis for generalized abdominal pain includes, but is not limited to AAA, gastroenteritis, appendicitis, Bowel obstruction, Bowel perforation. Gastroparesis, DKA, Hernia, Inflammatory bowel disease, mesenteric ischemia, pancreatitis, peritonitis SBP, volvulus.   This is not an exhaustive differential.   Past Medical History /  Co-morbidities / Social History:  has a past medical history of Anxiety, GERD (gastroesophageal reflux disease), Hernia of abdominal wall, HTN (hypertension), and SVT (supraventricular tachycardia) (HCC).  Additional history: Chart reviewed. Pertinent results include: admitted here from 5/6 - 5/9, had EGD performed  Physical Exam: Physical exam performed. The pertinent findings include: abdominal distention, reducible umbilical hernia, generalized abdominal ttp  Lab Tests: I ordered, and personally interpreted labs.  The pertinent results include:  hgb 9.3 up from 7.8 9 days ago. Lipase 74, Na 121, K 3.2, Chloride 91.    Imaging Studies: I ordered imaging studies including CT abdomen pelvis. I independently visualized and interpreted imaging which showed   1. Wall thickening and edema of the distal gastric body and gastric antrum worrisome for gastritis/peptic ulcer disease. There are findings worrisome for ulceration at the level of the gastric antrum along the anterior wall. Endoscopy recommended. 2. Appendix is prominent in size measuring up to 1 cm. There is no surrounding inflammation. Please correlate clinically for early acute appendicitis. 3. Diffuse fluid distension of small bowel loops worrisome for enteritis. 4. Small amount of free fluid throughout the abdomen and pelvis. 5. Stable hepatomegaly with nodular liver contour worrisome for cirrhosis. 6. Stable recanalization of the  umbilical vein with varices within an umbilical hernia. 7. Aortic atherosclerosis.  I agree with the radiologist interpretation.  Of note, patient without RLQ TTP, therefore feel appendicitis less likely.   Medications: I ordered medication including morphine , zofran , protonix , oral potassium, fluids  for pain, nausea/vomiting, PUD, hypokalemia, hyponatremia. Reevaluation of the patient after these medicines showed that the patient improved. I have reviewed the patients home medicines and have made adjustments as needed.  Consultations Obtained: I requested consultation with the GI on call Dr. Riley Cheadle,  and discussed lab and imaging findings as well as pertinent plan - they recommend: they will see the patient in consultation tomorrow.   Disposition: After consideration of the diagnostic results and the patients response to treatment, I feel that patient will require admission for hyponatremia, likely due to beer potomania which she was admitted for recently. GI will also see the patient to determine if additional intervention is indicated.   Discussed patient with hospitalist Dr. Quintella Buck who accepts patient for admission.  Final Clinical Impression(s) / ED Diagnoses Final diagnoses:  Hyponatremia  Hypokalemia  Nausea vomiting and diarrhea  Generalized abdominal pain  PUD (peptic ulcer disease)  Enteritis    Rx / DC Orders ED Discharge Orders     None         Fredna Jasper 05/27/23 1909    Ninetta Basket, MD 05/29/23 [phone]

## 2023-05-27 NOTE — Assessment & Plan Note (Addendum)
 Presenting with vomiting and diarrhea, abdominal pain.  CT with findings of enteritis, peptic ulcer disease, prominent appendix without inflammation,-correlate for early acute appendicitis.  Abdominal pain is epigastric in location.  WBC 8.7.  Afebrile. -Stool C. Difficile - hydrate. - Clear liquid diet, advance diet as tolerated.

## 2023-05-27 NOTE — ED Triage Notes (Signed)
 Pt BIB [facility] from home. Ptwith a hx of cirrhosis c/o abd pain x 1 month that has gradually been getting worse. She has associated N/V/D, decreased appetite, and abd swelling.   EMS Vitals   130/68 HR 108 SpO2 100% on RA

## 2023-05-27 NOTE — ED Notes (Signed)
 ED TO INPATIENT HANDOFF REPORT  ED Nurse Name and Phone #:   S Name/Age/Gender Joan Wood 53 y.o. female Room/Bed: APA06/APA06  Code Status   Code Status: Prior  Home/SNF/Other Home Patient oriented to: self, place, time, and situation Is this baseline? Yes   Triage Complete: Triage complete  Chief Complaint Hyponatremia [E87.1]  Triage Note Pt BIB [facility] from home. Ptwith a hx of cirrhosis c/o abd pain x 1 month that has gradually been getting worse. She has associated N/V/D, decreased appetite, and abd swelling.   EMS Vitals   130/68 HR 108 SpO2 100% on RA  Pt states abdominal pain has gotten worse, states she's vomited 3X today, not able to keep any food down.No appetite. No blood thinners. Pt states she was recently diagnosed with Cirrhosis.   Allergies Allergies  Allergen Reactions   Bee Venom Anaphylaxis    Cannot take epi for this allergy d/t heart condition per pt   Latex Rash    Level of Care/Admitting Diagnosis ED Disposition     ED Disposition  Admit   Condition  --   Comment  Hospital Area: [facility] [[id number]]  Level of Care: Telemetry [5]  Covid Evaluation: Asymptomatic - no recent exposure (last 10 days) testing not required  Diagnosis: Hyponatremia [161096]  Admitting Physician: EMOKPAE, EJIROGHENE E [[id number]]  Attending Physician: EMOKPAE, EJIROGHENE E [[id number]]          B Medical/Surgery History Past Medical History:  Diagnosis Date   Anxiety    GERD (gastroesophageal reflux disease)    Hernia of abdominal wall    HTN (hypertension)    SVT (supraventricular tachycardia) (HCC)    Past Surgical History:  Procedure Laterality Date   ESOPHAGOGASTRODUODENOSCOPY N/A 05/17/2023   Procedure: EGD (ESOPHAGOGASTRODUODENOSCOPY);  Surgeon: Vinetta Greening, DO;  Location: [facility];  Service: Endoscopy;  Laterality: N/A;   FINGER FRACTURE SURGERY     right ankle repair     age 34     A IV Location/Drains/Wounds Patient  Lines/Drains/Airways Status     Active Line/Drains/Airways     Name Placement date Placement time Site Days   Peripheral IV 05/27/23 20 G 1" Anterior;Proximal;Right Forearm 05/27/23  1417  Forearm  less than 1            Intake/Output Last 24 hours No intake or output data in the 24 hours ending 05/27/23 1939  Labs/Imaging Results for orders placed or performed during the hospital encounter of 05/27/23 (from the past 48 hours)  Urinalysis, Routine w reflex microscopic -Urine, Clean Catch     Status: Abnormal   Collection Time: 05/27/23  2:04 PM  Result Value Ref Range   Color, Urine YELLOW YELLOW   APPearance CLEAR CLEAR   Specific Gravity, Urine 1.004 (L) 1.005 - 1.030   pH 6.0 5.0 - 8.0   Glucose, UA NEGATIVE NEGATIVE mg/dL   Hgb urine dipstick SMALL (A) NEGATIVE   Bilirubin Urine NEGATIVE NEGATIVE   Ketones, ur NEGATIVE NEGATIVE mg/dL   Protein, ur NEGATIVE NEGATIVE mg/dL   Nitrite NEGATIVE NEGATIVE   Leukocytes,Ua NEGATIVE NEGATIVE   RBC / HPF 0-5 0 - 5 RBC/hpf   WBC, UA 0-5 0 - 5 WBC/hpf   Bacteria, UA NONE SEEN NONE SEEN   Squamous Epithelial / HPF 0-5 0 - 5 /HPF    Comment: Performed at [facility], [address] [place], [name] [address]  Comprehensive metabolic panel     Status: Abnormal   Collection Time:  05/27/23  2:19 PM  Result Value Ref Range   Sodium 121 (L) 135 - 145 mmol/L   Potassium 3.2 (L) 3.5 - 5.1 mmol/L   Chloride 91 (L) 98 - 111 mmol/L   CO2 21 (L) 22 - 32 mmol/L   Glucose, Bld 83 70 - 99 mg/dL    Comment: Glucose reference range applies only to samples taken after fasting for at least 8 hours.   BUN <5 (L) 6 - 20 mg/dL   Creatinine, Ser 4.09 (L) 0.44 - 1.00 mg/dL   Calcium  7.8 (L) 8.9 - 10.3 mg/dL   Total Protein 7.2 6.5 - 8.1 g/dL   Albumin 2.8 (L) 3.5 - 5.0 g/dL   AST 67 (H) 15 - 41 U/L   ALT 23 0 - 44 U/L   Alkaline Phosphatase 80 38 - 126 U/L   Total Bilirubin 1.0 0.0 - 1.2 mg/dL   GFR, Estimated >81 >19 mL/min    Comment:  (NOTE) Calculated using the CKD-EPI Creatinine Equation (2021)    Anion gap 9 5 - 15    Comment: Performed at [facility], [address] [place], [name] [address]  Lipase, blood     Status: Abnormal   Collection Time: 05/27/23  2:19 PM  Result Value Ref Range   Lipase 74 (H) 11 - 51 U/L    Comment: Performed at [facility], [address], [place], [name] [address]  CBC with Diff     Status: Abnormal   Collection Time: 05/27/23  2:19 PM  Result Value Ref Range   WBC 8.7 4.0 - 10.5 K/uL   RBC 2.88 (L) 3.87 - 5.11 MIL/uL   Hemoglobin 9.3 (L) 12.0 - 15.0 g/dL   HCT 30.8 (L) 65.7 - 84.6 %   MCV 97.9 80.0 - 100.0 fL   MCH 32.3 26.0 - 34.0 pg   MCHC 33.0 30.0 - 36.0 g/dL   RDW 96.2 95.2 - 84.1 %   Platelets 229 150 - 400 K/uL   nRBC 0.0 0.0 - 0.2 %   Neutrophils Relative % 64 %   Neutro Abs 5.7 1.7 - 7.7 K/uL   Lymphocytes Relative 25 %   Lymphs Abs 2.2 0.7 - 4.0 K/uL   Monocytes Relative 8 %   Monocytes Absolute 0.7 0.1 - 1.0 K/uL   Eosinophils Relative 1 %   Eosinophils Absolute 0.0 0.0 - 0.5 K/uL   Basophils Relative 1 %   Basophils Absolute 0.0 0.0 - 0.1 K/uL   Immature Granulocytes 1 %   Abs Immature Granulocytes 0.04 0.00 - 0.07 K/uL    Comment: Performed at [facility], [address] [place], [name] [address]  Magnesium      Status: None   Collection Time: 05/27/23  2:19 PM  Result Value Ref Range   Magnesium  1.7 1.7 - 2.4 mg/dL    Comment: Performed at [facility], [address] [place], [name] [address]   CT ABDOMEN PELVIS W CONTRAST Result Date: 05/27/2023 CLINICAL DATA:  Acute abdominal pain EXAM: CT ABDOMEN AND PELVIS WITH CONTRAST TECHNIQUE: Multidetector CT imaging of the abdomen and pelvis was performed using the standard protocol following bolus administration of intravenous contrast. RADIATION DOSE REDUCTION: This exam was performed according to the departmental dose-optimization program which includes automated exposure control, adjustment of the mA  and/or kV according to patient size and/or use of iterative reconstruction technique. CONTRAST:  OMNIPAQUE  IOHEXOL  300 MG/ML  SOLN COMPARISON:  CT abdomen and pelvis 05/11/2023 FINDINGS: Lower chest: No  acute abnormality. Hepatobiliary: The liver is enlarged with nodular liver contour similar to the prior study. There is a rounded hypodensity which is too small to characterize in the inferior right lobe of the liver which is unchanged. No new liver lesions are seen. Gallbladder and bile ducts are within normal limits. Pancreas: Unremarkable. No pancreatic ductal dilatation or surrounding inflammatory changes. Spleen: Normal in size without focal abnormality. Adrenals/Urinary Tract: Adrenal glands are unremarkable. Kidneys are normal, without renal calculi, focal lesion, or hydronephrosis. Bladder is unremarkable. Stomach/Bowel: The appendix is prominent in size measuring up to 1 cm. There is no surrounding inflammation. Is best seen on coronal image 4/51. There is no bowel obstruction, pneumatosis or free air. There is fluid distension of small bowel loops diffusely. There is wall thickening and edema of the distal gastric body and gastric antrum. There are findings worrisome for ulceration at the level of the gastric antrum along the anterior wall image 2/30. Vascular/Lymphatic: Portal vein and splenic vein appear patent. Aorta and IVC are normal in size. There are atherosclerotic calcifications of the aorta. There is recanalization of the umbilical vein with varices within an umbilical hernia similar to the prior study. No enlarged lymph nodes are seen. Reproductive: Uterus and bilateral adnexa are unremarkable. Other: There is a small amount of free fluid throughout the abdomen and pelvis. Musculoskeletal: There is grade 1 anterolisthesis at L4-L5 which is unchanged. Mild chronic appearing compression deformity of the superior endplate of T12 appears similar to the prior study. IMPRESSION: 1. Wall  thickening and edema of the distal gastric body and gastric antrum worrisome for gastritis/peptic ulcer disease. There are findings worrisome for ulceration at the level of the gastric antrum along the anterior wall. Endoscopy recommended. 2. Appendix is prominent in size measuring up to 1 cm. There is no surrounding inflammation. Please correlate clinically for early acute appendicitis. 3. Diffuse fluid distension of small bowel loops worrisome for enteritis. 4. Small amount of free fluid throughout the abdomen and pelvis. 5. Stable hepatomegaly with nodular liver contour worrisome for cirrhosis. 6. 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 7. Aortic atherosclerosis. Aortic Atherosclerosis (ICD10-I70.0). Electronically Signed   By: Tyron Gallon M.D.   On: 05/27/2023 16:29    Pending Labs Unresulted Labs (From admission, onward)    None       Vitals/Pain Today's Vitals   05/27/23 1800 05/27/23 1815 05/27/23 1900 05/27/23 1901  BP: 110/83 120/74 120/74   Pulse: 91 89 89   Resp:   20   Temp:   98.3 F (36.8 C)   TempSrc:   Oral   SpO2: 97% 97% 97%   Weight:      Height:      PainSc:    4     Isolation Precautions No active isolations  Medications Medications  morphine  (PF) 4 MG/ML injection 4 mg (4 mg Intravenous Given 05/27/23 1441)  ondansetron  (ZOFRAN ) injection 4 mg (4 mg Intravenous Given 05/27/23 1441)  iohexol  (OMNIPAQUE ) 300 MG/ML solution 100 mL (100 mLs Intravenous Contrast Given 05/27/23 1549)  pantoprazole  (PROTONIX ) injection 40 mg (40 mg Intravenous Given 05/27/23 1737)  morphine  (PF) 4 MG/ML injection 4 mg (4 mg Intravenous Given 05/27/23 1832)  ondansetron  (ZOFRAN ) injection 4 mg (4 mg Intravenous Given 05/27/23 1832)  sodium chloride  0.9 % bolus 1,000 mL (1,000 mLs Intravenous New Bag/Given 05/27/23 1907)  potassium chloride  SA (KLOR-CON  M) CR tablet 40 mEq (40 mEq Oral Given 05/27/23 1908)  Mobility walks     Focused  Assessments    R Recommendations: See Admitting Provider Note  Report given to:   Additional Notes:

## 2023-05-27 NOTE — Assessment & Plan Note (Addendum)
 Reports ongoing melena.  Hgb stable, likely some hemoconcentration.  Recent EGD 05/17/2023- by Dr. Mordechai April, with findings of esophageal varices, nonbleeding gastric ulcers and esophagitis..  Meloxicam  on med list, positive alcohol abuse.  -IV Protonix  40 twice daily -Sucralfate  4 times daily

## 2023-05-28 DIAGNOSIS — Z791 Long term (current) use of non-steroidal anti-inflammatories (NSAID): Secondary | ICD-10-CM | POA: Diagnosis not present

## 2023-05-28 DIAGNOSIS — R109 Unspecified abdominal pain: Secondary | ICD-10-CM

## 2023-05-28 DIAGNOSIS — K7030 Alcoholic cirrhosis of liver without ascites: Secondary | ICD-10-CM

## 2023-05-28 DIAGNOSIS — F1090 Alcohol use, unspecified, uncomplicated: Secondary | ICD-10-CM | POA: Diagnosis not present

## 2023-05-28 DIAGNOSIS — Z91148 Patient's other noncompliance with medication regimen for other reason: Secondary | ICD-10-CM | POA: Diagnosis not present

## 2023-05-28 DIAGNOSIS — R112 Nausea with vomiting, unspecified: Secondary | ICD-10-CM | POA: Diagnosis not present

## 2023-05-28 DIAGNOSIS — Z720 Tobacco use: Secondary | ICD-10-CM | POA: Diagnosis not present

## 2023-05-28 DIAGNOSIS — Z91030 Bee allergy status: Secondary | ICD-10-CM | POA: Diagnosis not present

## 2023-05-28 DIAGNOSIS — E871 Hypo-osmolality and hyponatremia: Secondary | ICD-10-CM | POA: Diagnosis not present

## 2023-05-28 DIAGNOSIS — K529 Noninfective gastroenteritis and colitis, unspecified: Secondary | ICD-10-CM | POA: Diagnosis not present

## 2023-05-28 DIAGNOSIS — I10 Essential (primary) hypertension: Secondary | ICD-10-CM | POA: Diagnosis not present

## 2023-05-28 DIAGNOSIS — K429 Umbilical hernia without obstruction or gangrene: Secondary | ICD-10-CM | POA: Diagnosis not present

## 2023-05-28 DIAGNOSIS — K769 Liver disease, unspecified: Secondary | ICD-10-CM

## 2023-05-28 DIAGNOSIS — Z79899 Other long term (current) drug therapy: Secondary | ICD-10-CM | POA: Diagnosis not present

## 2023-05-28 DIAGNOSIS — R160 Hepatomegaly, not elsewhere classified: Secondary | ICD-10-CM | POA: Diagnosis not present

## 2023-05-28 DIAGNOSIS — R1110 Vomiting, unspecified: Secondary | ICD-10-CM | POA: Diagnosis present

## 2023-05-28 DIAGNOSIS — F17210 Nicotine dependence, cigarettes, uncomplicated: Secondary | ICD-10-CM | POA: Diagnosis not present

## 2023-05-28 DIAGNOSIS — F1010 Alcohol abuse, uncomplicated: Secondary | ICD-10-CM | POA: Diagnosis present

## 2023-05-28 DIAGNOSIS — Z1152 Encounter for screening for COVID-19: Secondary | ICD-10-CM | POA: Diagnosis not present

## 2023-05-28 DIAGNOSIS — E869 Volume depletion, unspecified: Secondary | ICD-10-CM | POA: Diagnosis not present

## 2023-05-28 DIAGNOSIS — K2101 Gastro-esophageal reflux disease with esophagitis, with bleeding: Secondary | ICD-10-CM | POA: Diagnosis not present

## 2023-05-28 DIAGNOSIS — E876 Hypokalemia: Secondary | ICD-10-CM | POA: Diagnosis not present

## 2023-05-28 DIAGNOSIS — Z91040 Latex allergy status: Secondary | ICD-10-CM | POA: Diagnosis not present

## 2023-05-28 DIAGNOSIS — K7689 Other specified diseases of liver: Secondary | ICD-10-CM | POA: Diagnosis not present

## 2023-05-28 DIAGNOSIS — G8929 Other chronic pain: Secondary | ICD-10-CM | POA: Diagnosis not present

## 2023-05-28 DIAGNOSIS — K279 Peptic ulcer, site unspecified, unspecified as acute or chronic, without hemorrhage or perforation: Secondary | ICD-10-CM | POA: Diagnosis not present

## 2023-05-28 DIAGNOSIS — K580 Irritable bowel syndrome with diarrhea: Secondary | ICD-10-CM | POA: Diagnosis not present

## 2023-05-28 DIAGNOSIS — K7031 Alcoholic cirrhosis of liver with ascites: Secondary | ICD-10-CM | POA: Diagnosis present

## 2023-05-28 DIAGNOSIS — K254 Chronic or unspecified gastric ulcer with hemorrhage: Secondary | ICD-10-CM | POA: Diagnosis not present

## 2023-05-28 LAB — SEDIMENTATION RATE: Sed Rate: 65 mm/h — ABNORMAL HIGH (ref 0–22)

## 2023-05-28 LAB — BASIC METABOLIC PANEL WITH GFR
Anion gap: 5 (ref 5–15)
Anion gap: 9 (ref 5–15)
BUN: <5 mg/dL — ABNORMAL LOW (ref 6–20)
BUN: <5 mg/dL — ABNORMAL LOW (ref 6–20)
CO2: 23 mmol/L (ref 22–32)
CO2: 22 mmol/L (ref 22–32)
Calcium: 7.9 mg/dL — ABNORMAL LOW (ref 8.9–10.3)
Calcium: 8.7 mg/dL — ABNORMAL LOW (ref 8.9–10.3)
Chloride: 101 mmol/L (ref 98–111)
Chloride: 93 mmol/L — ABNORMAL LOW (ref 98–111)
Creatinine, Ser: 0.4 mg/dL — ABNORMAL LOW (ref 0.44–1.00)
Creatinine, Ser: 0.37 mg/dL — ABNORMAL LOW (ref 0.44–1.00)
GFR, Estimated: >60 mL/min (ref >60)
GFR, Estimated: >60 mL/min (ref >60)
Glucose, Bld: 71 mg/dL (ref 70–99)
Glucose, Bld: 89 mg/dL (ref 70–99)
Potassium: 4.3 mmol/L (ref 3.5–5.1)
Potassium: 4.5 mmol/L (ref 3.5–5.1)
Sodium: 129 mmol/L — ABNORMAL LOW (ref 135–145)
Sodium: 124 mmol/L — ABNORMAL LOW (ref 135–145)

## 2023-05-28 LAB — GLUCOSE, CAPILLARY: Glucose-Capillary: 104 mg/dL — ABNORMAL HIGH (ref 70–99)

## 2023-05-28 LAB — CBC
HCT: 26.7 % — ABNORMAL LOW (ref 36.0–46.0)
Hemoglobin: 8.6 g/dL — ABNORMAL LOW (ref 12.0–15.0)
MCH: 32.5 pg (ref 26.0–34.0)
MCHC: 32.2 g/dL (ref 30.0–36.0)
MCV: 100.8 fL — ABNORMAL HIGH (ref 80.0–100.0)
Platelets: 203 10*3/uL (ref 150–400)
RBC: 2.65 MIL/uL — ABNORMAL LOW (ref 3.87–5.11)
RDW: 14.2 % (ref 11.5–15.5)
WBC: 6.4 10*3/uL (ref 4.0–10.5)
nRBC: 0 % (ref 0.0–0.2)

## 2023-05-28 LAB — RESP PANEL BY RT-PCR (RSV, FLU A&B, COVID)  RVPGX2
Influenza A by PCR: NEGATIVE
Influenza B by PCR: NEGATIVE
Resp Syncytial Virus by PCR: NEGATIVE
SARS Coronavirus 2 by RT PCR: NEGATIVE

## 2023-05-28 LAB — HEPATIC FUNCTION PANEL
ALT: 22 U/L (ref 0–44)
AST: 57 U/L — ABNORMAL HIGH (ref 15–41)
Albumin: 2.9 g/dL — ABNORMAL LOW (ref 3.5–5.0)
Alkaline Phosphatase: 80 U/L (ref 38–126)
Bilirubin, Direct: 0.5 mg/dL — ABNORMAL HIGH (ref 0.0–0.2)
Indirect Bilirubin: 0.9 mg/dL (ref 0.3–0.9)
Total Bilirubin: 1.4 mg/dL — ABNORMAL HIGH (ref 0.0–1.2)
Total Protein: 7.1 g/dL (ref 6.5–8.1)

## 2023-05-28 LAB — PROTIME-INR
INR: 1.3 — ABNORMAL HIGH (ref 0.8–1.2)
Prothrombin Time: 16.5 s — ABNORMAL HIGH (ref 11.4–15.2)

## 2023-05-28 LAB — C-REACTIVE PROTEIN: CRP: 0.6 mg/dL (ref <1.0)

## 2023-05-28 MED ORDER — SODIUM CHLORIDE 0.9 % IV SOLN: INTRAVENOUS | Status: DC

## 2023-05-28 MED ORDER — MAGNESIUM SULFATE 2 GM/50ML IV SOLN
2.0000 g | Freq: Once | INTRAVENOUS | Status: AC
  Administered 2023-05-28: 2 g via INTRAVENOUS
  Filled 2023-05-28: qty 50

## 2023-05-28 NOTE — Progress Notes (Addendum)
   05/28/23 1240  TOC Brief Assessment  Insurance and Status Reviewed  Patient has primary care physician Yes  Home environment has been reviewed Home  Prior level of function: independent  Prior/Current Home Services No current home services  Social Drivers of Health Review SDOH reviewed no interventions necessary  Readmission risk has been reviewed Yes  Transition of care needs no transition of care needs at this time    Substances Abuse added to AVS.   Transition of Care Department (TOC) has reviewed patient and no TOC needs have been identified at this time. We will continue to monitor patient advancement through interdisciplinary progression rounds. If new patient transition needs arise, please place a TOC consult.

## 2023-05-28 NOTE — Progress Notes (Signed)
 PROGRESS NOTE  Joan Wood ZOX:[id number] DOB: March 05, 1970 DOA: 05/27/2023 PCP: Zarwolo, Gloria, FNP  Brief History:  53 year old female with a history of alcohol abuse, tobacco abuse, hypertension, anxiety, depression, alcohol liver cirrhosis, and SVT presenting with at least 1 month history of upper abdominal pain associate with nausea, vomiting, and diarrhea.  The patient states that she has at least 3 episodes of emesis on a daily basis with multiple loose stools.  She continues to report intermittent melena and hematochezia.  Since her last d/c from the hospital, she states she largely has not been compliant with her meds.  She states she is "overwhelmed by the number of medications I have to take".   She she denies any chest pain but has some intermittent shortness of breath.  She denies fevers or chills.  She continues to smoke cigarettes.  She continues to drink beer.  Notably, the patient had recent hospitalization on 05/12/2023 to 05/14/23 with a similar presentation.  During that hospitalization she presented with a sodium 123 and hemoglobin 10.8.  Unfortunately, she left AMA.  She was readmitted from 05/15/2023 to 05/18/23. During this hospitalization, she presented with a sodium of 120 and hemoglobin 9.9.  She underwent EGD on 05/17/2023 which showed esophageal varices, nonbleeding gastric ulcers and esophagitis with plans for her to take protonix  BID, sulcralfate QID.  Her hyponatremia was attributed to poor solute intake and beer potomania.  Her sodium was 130 on the day of discharge.  In the ED, the patient was afebrile hemodynamically stable with oxygen saturation 99% on room air.  WBC 6.4, hemoglobin 9.6, platelets 203.  Sodium 121, potassium 3.2, bicarbonate 21, serum creatinine 0.34.  The patient was started normal saline.  She was admitted for further evaluation and treatment.  GI was consulted to assist with management.   Assessment/Plan: Intractable nausea, vomiting,  diarrhea - Secondary to gastroenteritis in the setting of continued NSAID and alcohol use - lipase 74 - Stool pathogen panel -Check C. difficile - Pantoprazole  twice daily - Restart Carafate  - Full liquid diet - GI consult - Check COVID-19  Hematochezia - Patient's hemoglobin has gradually drifted down from 13.2 on 04/11/23 - GI consult - Stop NSAIDs--was still taking meloxicam   Hyponatremia - Secondary to poor solute intake and volume depletion in the setting of alcohol use - Saline lock IV fluids to prevent rapid rise in sodium - Monitor serial sodium  Chronic abdominal pain/chronic back pain - 05/27/2023 CT AP--wall thickening and edema of the distal gastric body and gastric antrum; prominent appendix up to 1 cm without surrounding inflammation; diffuse distention of small bowel loops - I do not believe the patient likely has appendicitis - Judicious opioids - Patient is afebrile without leukocytosis - PDMP reviewed--tramadol , #20, filled 04/24/2023 - Norco 5/325, #10, filled 04/15/23  Alcohol abuse - Alcohol withdrawal protocol  Tobacco abuse - Tobacco cessation discussed  Hypokalemia - Replete - Magnesium  1.7  Alcoholic liver cirrhosis - Continue to drink beer - Appears compensated presently         Family Communication:  no Family at bedside  Consultants:  GI  Code Status:  FULL   DVT Prophylaxis:  SCDs   Procedures: As Listed in Progress Note Above  Antibiotics: None      Subjective: Patient complains of upper abdominal pain.  She has some shortness of breath.  She denies any vomiting, but has some nausea.  She denies any headache, chest pain, dysuria.  She has had some hematochezia and melena.  Objective: Vitals:   05/28/23 0449 05/28/23 0500 05/28/23 0632 05/28/23 0800  BP: 128/69 (!) 158/95 113/60   Pulse: (!) 102 99 99   Resp: 17 17 11    Temp:    98.9 F (37.2 C)  TempSrc:    Oral  SpO2: 100% 95% 100%   Weight:      Height:         Intake/Output Summary (Last 24 hours) at 05/28/2023 0835 Last data filed at 05/28/2023 1610 Gross per 24 hour  Intake 579.56 ml  Output 700 ml  Net -120.44 ml   Weight change:  Exam:  General:  Pt is alert, follows commands appropriately, not in acute distress HEENT: No icterus, No thrush, No neck mass, [place]/AT Cardiovascular: RRR, S1/S2, no rubs, no gallops Respiratory: Bibasilar crackles.  No wheezing.  Good air movement Abdomen: Soft/+BS, upper abdomen tender, non distended, no guarding Extremities: No edema, No lymphangitis, No petechiae, No rashes, no synovitis   Data Reviewed: I have personally reviewed following labs and imaging studies Basic Metabolic Panel: Recent Labs  Lab 05/27/23 1419 05/28/23 0400  NA 121* 129*  K 3.2* 4.3  CL 91* 101  CO2 21* 23  GLUCOSE 83 71  BUN <5* <5*  CREATININE 0.34* 0.40*  CALCIUM  7.8* 7.9*  MG 1.7  --    Liver Function Tests: Recent Labs  Lab 05/27/23 1419  AST 67*  ALT 23  ALKPHOS 80  BILITOT 1.0  PROT 7.2  ALBUMIN 2.8*   Recent Labs  Lab 05/27/23 1419  LIPASE 74*   No results for input(s): "AMMONIA" in the last 168 hours. Coagulation Profile: No results for input(s): "INR", "PROTIME" in the last 168 hours. CBC: Recent Labs  Lab 05/27/23 1419 05/28/23 0400  WBC 8.7 6.4  NEUTROABS 5.7  --   HGB 9.3* 8.6*  HCT 28.2* 26.7*  MCV 97.9 100.8*  PLT 229 203   Cardiac Enzymes: No results for input(s): "CKTOTAL", "CKMB", "CKMBINDEX", "TROPONINI" in the last 168 hours. BNP: Invalid input(s): "POCBNP" CBG: No results for input(s): "GLUCAP" in the last 168 hours. HbA1C: No results for input(s): "HGBA1C" in the last 72 hours. Urine analysis:    Component Value Date/Time   COLORURINE YELLOW 05/27/2023 1404   APPEARANCEUR CLEAR 05/27/2023 1404   LABSPEC 1.004 (L) 05/27/2023 1404   PHURINE 6.0 05/27/2023 1404   GLUCOSEU NEGATIVE 05/27/2023 1404   HGBUR SMALL (A) 05/27/2023 1404   BILIRUBINUR NEGATIVE  05/27/2023 1404   KETONESUR NEGATIVE 05/27/2023 1404   PROTEINUR NEGATIVE 05/27/2023 1404   NITRITE NEGATIVE 05/27/2023 1404   LEUKOCYTESUR NEGATIVE 05/27/2023 1404   Sepsis Labs: @LABRCNTIP (procalcitonin:4,lacticidven:4) ) Recent Results (from the past 240 hours)  MRSA Next Gen by PCR, Nasal     Status: None   Collection Time: 05/27/23  7:51 PM   Specimen: Nasal Mucosa; Nasal Swab  Result Value Ref Range Status   MRSA by PCR Next Gen NOT DETECTED NOT DETECTED Final    Comment: (NOTE) The GeneXpert MRSA Assay (FDA approved for NASAL specimens only), is one component of a comprehensive MRSA colonization surveillance program. It is not intended to diagnose MRSA infection nor to guide or monitor treatment for MRSA infections. Test performance is not FDA approved in patients less than 83 years old. Performed at [facility], [address] [place],  [address]      Scheduled Meds:  Chlorhexidine  Gluconate Cloth  6 each Topical Daily   folic acid   1 mg Oral Daily   multivitamin with minerals  1 tablet Oral Daily   pantoprazole  (PROTONIX ) IV  40 mg Intravenous Q12H   sucralfate   1 g Oral TID WC & HS   thiamine   100 mg Oral Daily   Or   thiamine   100 mg Intravenous Daily   Continuous Infusions:  0.9 % NaCl with KCl 20 mEq / L 75 mL/hr at 05/27/23 2152    Procedures/Studies: CT ABDOMEN PELVIS W CONTRAST Result Date: 05/27/2023 CLINICAL DATA:  Acute abdominal pain EXAM: CT ABDOMEN AND PELVIS WITH CONTRAST TECHNIQUE: Multidetector CT imaging of the abdomen and pelvis was performed using the standard protocol following bolus administration of intravenous contrast. RADIATION DOSE REDUCTION: This exam was performed according to the departmental dose-optimization program which includes automated exposure control, adjustment of the mA and/or kV according to patient size and/or use of iterative reconstruction technique. CONTRAST:  OMNIPAQUE  IOHEXOL  300 MG/ML  SOLN COMPARISON:  CT  abdomen and pelvis 05/11/2023 FINDINGS: Lower chest: No acute abnormality. Hepatobiliary: The liver is enlarged with nodular liver contour similar to the prior study. There is a rounded hypodensity which is too small to characterize in the inferior right lobe of the liver which is unchanged. No new liver lesions are seen. Gallbladder and bile ducts are within normal limits. Pancreas: Unremarkable. No pancreatic ductal dilatation or surrounding inflammatory changes. Spleen: Normal in size without focal abnormality. Adrenals/Urinary Tract: Adrenal glands are unremarkable. Kidneys are normal, without renal calculi, focal lesion, or hydronephrosis. Bladder is unremarkable. Stomach/Bowel: The appendix is prominent in size measuring up to 1 cm. There is no surrounding inflammation. Is best seen on coronal image 4/51. There is no bowel obstruction, pneumatosis or free air. There is fluid distension of small bowel loops diffusely. There is wall thickening and edema of the distal gastric body and gastric antrum. There are findings worrisome for ulceration at the level of the gastric antrum along the anterior wall image 2/30. Vascular/Lymphatic: Portal vein and splenic vein appear patent. Aorta and IVC are normal in size. There are atherosclerotic calcifications of the aorta. There is recanalization of the umbilical vein with varices within an umbilical hernia similar to the prior study. No enlarged lymph nodes are seen. Reproductive: Uterus and bilateral adnexa are unremarkable. Other: There is a small amount of free fluid throughout the abdomen and pelvis. Musculoskeletal: There is grade 1 anterolisthesis at L4-L5 which is unchanged. Mild chronic appearing compression deformity of the superior endplate of T12 appears similar to the prior study. IMPRESSION: 1. Wall thickening and edema of the distal gastric body and gastric antrum worrisome for gastritis/peptic ulcer disease. There are findings worrisome for ulceration at  the level of the gastric antrum along the anterior wall. Endoscopy recommended. 2. Appendix is prominent in size measuring up to 1 cm. There is no surrounding inflammation. Please correlate clinically for early acute appendicitis. 3. Diffuse fluid distension of small bowel loops worrisome for enteritis. 4. Small amount of free fluid throughout the abdomen and pelvis. 5. Stable hepatomegaly with nodular liver contour worrisome for cirrhosis. 6. 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 7. Aortic atherosclerosis. Aortic Atherosclerosis (ICD10-I70.0). Electronically Signed   By: Tyron Gallon M.D.   On: 05/27/2023 16:29   DG Foot Complete Right Result Date: 05/15/2023 CLINICAL DATA:  Prior fracture EXAM: RIGHT FOOT COMPLETE - 3+ VIEW COMPARISON:  04/15/2023 FINDINGS: Bony demineralization. Transverse fractures of the proximal metaphyses of the second, third, likely fourth metatarsals. These  remain nondisplaced. No current malalignment at the Lisfranc joint. Dorsal soft tissue swelling along the forefoot. IMPRESSION: 1. Transverse fractures of the proximal metaphyses of the second, third, and likely fourth metatarsals. These remain nondisplaced. 2. Dorsal soft tissue swelling along the forefoot. 3. Bony demineralization. Electronically Signed   By: Freida Jes M.D.   On: 05/15/2023 12:47   CT Chest Wo Contrast Result Date: 05/12/2023 CLINICAL DATA:  53 year old female status post fall backwards yesterday. Cirrhosis. Syncope. Pain. EXAM: CT CHEST WITHOUT CONTRAST TECHNIQUE: Multidetector CT imaging of the chest was performed following the standard protocol without IV contrast. RADIATION DOSE REDUCTION: This exam was performed according to the departmental dose-optimization program which includes automated exposure control, adjustment of the mA and/or kV according to patient size and/or use of iterative reconstruction technique. COMPARISON:  Cervical spine CT today. CT  Abdomen and Pelvis yesterday. FINDINGS: Cardiovascular: Calcified aortic atherosclerosis. Calcified coronary artery atherosclerosis on series 2, image 80. No cardiomegaly. No pericardial effusion. Vascular patency is not evaluated in the absence of IV contrast. Mediastinum/Nodes: Negative for mediastinal mass or lymphadenopathy. Lungs/Pleura: Major airways are patent. Normal lung volumes. Both lungs are clear. No pleural effusion. Upper Abdomen: Stable compared to the CT Abdomen and Pelvis yesterday. Musculoskeletal: Mild superior thoracic endplate compression fractures at T1, T3, T9 (moderate), T12. The latter 2 levels appear to be chronic. Underlying osteopenia. No retropulsion or complicating features. No rib fracture identified. Sternum and visible shoulder osseous structures appear intact. IMPRESSION: 1. Osteopenia with multiple generally mild thoracic compression fractures, T1, T3, T9, and T12. T1 and T3 appear age indeterminate. 2. No other acute traumatic injury identified in the noncontrast Chest. 3. Calcified coronary artery and Aortic Atherosclerosis (ICD10-I70.0). 4. Upper abdomen stable from CT Abdomen and Pelvis yesterday. Electronically Signed   By: Marlise Simpers M.D.   On: 05/12/2023 10:23   CT Cervical Spine Wo Contrast Result Date: 05/12/2023 CLINICAL DATA:  53 year old female status post fall backwards yesterday. Cirrhosis. Syncope. Pain. EXAM: CT CERVICAL SPINE WITHOUT CONTRAST TECHNIQUE: Multidetector CT imaging of the cervical spine was performed without intravenous contrast. Multiplanar CT image reconstructions were also generated. RADIATION DOSE REDUCTION: This exam was performed according to the departmental dose-optimization program which includes automated exposure control, adjustment of the mA and/or kV according to patient size and/or use of iterative reconstruction technique. COMPARISON:  Head CT today.  Chest CT reported separately. FINDINGS: Alignment: Straightening of cervical lordosis.  Cervicothoracic junction alignment is within normal limits. Mild degenerative appearing retrolisthesis of C5 on C6 with associated disc space loss. Bilateral posterior element alignment is within normal limits. Skull base and vertebrae: Osteopenia. Visualized skull base is intact. No atlanto-occipital dissociation. C1 and C2 appear intact and aligned. No acute osseous abnormality identified. Soft tissues and spinal canal: No prevertebral fluid or swelling. No visible canal hematoma. Negative visible noncontrast neck soft tissues. Disc levels: Mild for age cervical spine degeneration except at C5-C6 where disc space loss and asymmetric endplate spurring is associated with mild retrolisthesis. Probably no significant cervical spinal stenosis by CT. Upper chest: Subtle T1 superior endplate compression is age indeterminate. No retropulsion or complicating features. Lung apices are clear. IMPRESSION: 1. No acute traumatic injury identified in the cervical spine. 2. Osteopenia.  Chronic degeneration at C5-C6. 3. Mild T1 superior endplate compression fracture is age indeterminate. Electronically Signed   By: Marlise Simpers M.D.   On: 05/12/2023 10:19   CT Head Wo Contrast Result Date: 05/12/2023 CLINICAL DATA:  53 year old female status post fall backwards  yesterday. Cirrhosis. Syncope. Pain. EXAM: CT HEAD WITHOUT CONTRAST TECHNIQUE: Contiguous axial images were obtained from the base of the skull through the vertex without intravenous contrast. RADIATION DOSE REDUCTION: This exam was performed according to the departmental dose-optimization program which includes automated exposure control, adjustment of the mA and/or kV according to patient size and/or use of iterative reconstruction technique. COMPARISON:  None Available. FINDINGS: Brain: Cerebral volume probably at the lower limits of normal for age. No midline shift, ventriculomegaly, mass effect, evidence of mass lesion, intracranial hemorrhage or evidence of cortically  based acute infarction. Patchy, symmetric moderate bilateral cerebral white matter hypodensity which is mostly periventricular. Vascular: No suspicious intracranial vascular hyperdensity. Calcified atherosclerosis at the skull base. Skull: Osteopenia. Intact. No acute osseous abnormality identified. Sinuses/Orbits: Visualized paranasal sinuses and mastoids are clear. Other: No acute orbit or scalp soft tissue injury identified. IMPRESSION: 1. No acute traumatic injury identified. 2. Moderate cerebral white matter changes, most commonly due to small vessel disease. Electronically Signed   By: Marlise Simpers M.D.   On: 05/12/2023 10:17   CT ABDOMEN PELVIS W CONTRAST Result Date: 05/11/2023 CLINICAL DATA:  Acute generalized abdominal pain, nausea, vomiting. EXAM: CT ABDOMEN AND PELVIS WITH CONTRAST TECHNIQUE: Multidetector CT imaging of the abdomen and pelvis was performed using the standard protocol following bolus administration of intravenous contrast. RADIATION DOSE REDUCTION: This exam was performed according to the departmental dose-optimization program which includes automated exposure control, adjustment of the mA and/or kV according to patient size and/or use of iterative reconstruction technique. CONTRAST:  OMNIPAQUE  IOHEXOL  300 MG/ML  SOLN COMPARISON:  April 14, 2023.  August 14, 2020. FINDINGS: Lower chest: No acute abnormality. Hepatobiliary: Nodular hepatic contours are noted consistent with hepatic cirrhosis. No cholelithiasis or biliary dilatation is noted. Patent periumbilical vein is noted consistent with portal hypertension. Stable probable hemangioma seen inferiorly in right hepatic lobe. Pancreas: Unremarkable. No pancreatic ductal dilatation or surrounding inflammatory changes. Spleen: Normal in size without focal abnormality. Adrenals/Urinary Tract: Adrenal glands are unremarkable. Kidneys are normal, without renal calculi, focal lesion, or hydronephrosis. Bladder is unremarkable. Stomach/Bowel:  Wall thickening of gastric antrum is noted suggesting possible inflammation or peptic ulcer disease. There is no evidence of bowel obstruction. The appendix is unremarkable. Vascular/Lymphatic: Aortic atherosclerosis. No enlarged abdominal or pelvic lymph nodes. Reproductive: Uterus and bilateral adnexa are unremarkable. Other: No ascites is noted. Musculoskeletal: No acute or significant osseous findings. IMPRESSION: Hepatic cirrhosis is noted with recanalized periumbilical vein consistent with portal hypertension. Wall thickening of gastric antrum is noted suggesting possible inflammation or peptic ulcer disease. Aortic Atherosclerosis (ICD10-I70.0). Electronically Signed   By: Rosalene Colon M.D.   On: 05/11/2023 17:07    Demaris Fillers, DO  [facility]  If 7PM-7AM, please contact night-coverage [email or web address] Password [email or web address] 05/28/2023, 8:35 AM   LOS: 0 days

## 2023-05-28 NOTE — Hospital Course (Addendum)
 53 year old female with a history of alcohol abuse, tobacco abuse, hypertension, anxiety, depression, alcohol liver cirrhosis, and SVT presenting with at least 1 month history of upper abdominal pain associate with nausea, vomiting, and diarrhea.  The patient states that she has at least 3 episodes of emesis on a daily basis with multiple loose stools.  She continues to report intermittent melena and hematochezia.  Since her last d/c from the hospital, she states she largely has not been compliant with her meds.  She states she is "overwhelmed by the number of medications I have to take".   She she denies any chest pain but has some intermittent shortness of breath.  She denies fevers or chills.  She continues to smoke cigarettes.  She continues to drink beer.  Notably, the patient had recent hospitalization on 05/12/2023 to 05/14/23 with a similar presentation.  During that hospitalization she presented with a sodium 123 and hemoglobin 10.8.  Unfortunately, she left AMA.  She was readmitted from 05/15/2023 to 05/18/23. During this hospitalization, she presented with a sodium of 120 and hemoglobin 9.9.  She underwent EGD on 05/17/2023 which showed esophageal varices, nonbleeding gastric ulcers and esophagitis with plans for her to take protonix  BID, sulcralfate QID.  Her hyponatremia was attributed to poor solute intake and beer potomania.  Her sodium was 130 on the day of discharge.  In the ED, the patient was afebrile hemodynamically stable with oxygen saturation 99% on room air.  WBC 6.4, hemoglobin 9.6, platelets 203.  Sodium 121, potassium 3.2, bicarbonate 21, serum creatinine 0.34.  The patient was started normal saline.  She was admitted for further evaluation and treatment.  GI was consulted to assist with management.

## 2023-05-28 NOTE — Plan of Care (Signed)

## 2023-05-28 NOTE — Progress Notes (Signed)
 Mobility Specialist Progress Note:    05/28/23 1042  Mobility  Activity Ambulated with assistance in room;Ambulated with assistance to bathroom  Level of Assistance Standby assist, set-up cues, supervision of patient - no hands on  Assistive Device [employer] Ambulated (ft) 25 ft  Range of Motion/Exercises Active;All extremities  Activity Response Tolerated well  Mobility Referral Yes  Mobility visit 1 Mobility  Mobility Specialist Start Time (ACUTE ONLY) 1030  Mobility Specialist Stop Time (ACUTE ONLY) 1042  Mobility Specialist Time Calculation (min) (ACUTE ONLY) 12 min   Pt received with bathroom light on. Required SBA to stand and ambulate with cane. Tolerated well, asx throughout. Left pt sitting EOB, all needs met.  Glinda Lapping Mobility Specialist Please contact via Special educational needs teacher or  Rehab office at [phone]

## 2023-05-28 NOTE — Consult Note (Signed)
 Gastroenterology Consult   Referring Provider: Dr. Winferd Hatter  Primary Care Physician:  Zarwolo, Gloria, FNP Primary Gastroenterologist:  Dr.  Patient ID: Joan Wood; [id number]; [birth year]   Admit date: 05/27/2023  LOS: 0 days   Date of Consultation: 05/28/2023  Reason for Consultation:  Abdominal pain, nausea, vomiting, diarrhea  History of Present Illness   Joan Wood is a 53 y.o. year old female with a history of ETOH abuse, cirrhosis felt likely due to ETOH, GERD, HTN, SVT, periumbilical hernia, chronic diarrhea, recent GI bleed with findings of portal gastropathy and PUD on EGD May 2025 during last hospitalization, discharged 5/9 and now presented again to the ED yesterday, 5/18, with complaints of abdominal pain. GI now consulted.  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 Hyponatremia with sodium 121, hypokalemia with potassium, 3.2, Lipase 74, AST 67, ALT 23, Tbili 1.0.   Today: Presenting to ED yesterday feeling abdomen distended, pain generalized, felt "stuffed". Notes early satiety. Feels abdomen is tighter than normal. No fever/chills. Chronic intermittent diarrhea noted. Ongoing for past year and had gotten better with meds but unknown what this was. Chronic low volume hematochezia unchanged. Last colonoscopy around 20 years ago at [facility] with polyps. States she has had ongoing spells of diarrhea up to 10-12 a day dating back to last colonoscopy 20 years ago. Rarely skips a day of stool. Once in awhile a soft BM.   Rare Ibuprofen. Only tylenol . Notes frequent falls with  electrolyte abnormalities.  Denies mental status changes, confusion.   ETOH use since age 53, previously was 6-8 12 ounce beers a day, now down to 2 beers two days a week. Has quit on several prior occasions ranging from 3 months to 2 years.   She is unsure if taking PPI BID or carafate . States multiple medications were confusing to her.    Prior CT imaging in the past (2022) with infectious vs inflammatory etneritis   ENDOSCOPIC evaluations EGD May 2025 while inpatient: LA Grade D esophagitis, portal gastropathy, 15 mm non-bleeding gastric ulcer with clean base s/p biopsy, 3 additional non-bleeding gastric ulcers with largest 4 mm, normal duodenum. Surveillance in 8-10 weeks. NEGATIVE h.pylori.   REMOTE colonoscopy about 20 years ago with polyps.    No FH colon cancer or IBD +personal hx of polyps   Past Medical History:  Diagnosis Date   Anxiety    GERD (gastroesophageal reflux disease)    Hernia of abdominal wall    HTN (hypertension)    SVT (supraventricular tachycardia) (HCC)     Past Surgical History:  Procedure Laterality Date   ESOPHAGOGASTRODUODENOSCOPY N/A 05/17/2023   Procedure: EGD (ESOPHAGOGASTRODUODENOSCOPY);  Surgeon: Vinetta Greening, DO;  Location: [facility];  Service: Endoscopy;  Laterality: N/A;   FINGER FRACTURE SURGERY     right ankle repair     age 40    Prior to Admission medications   Medication Sig Start Date End Date Taking? Authorizing Provider  acetaminophen  (TYLENOL ) 500 MG tablet Take 1,500 mg by mouth every 6 (six) hours as needed for moderate pain (pain score 4-6).   Yes [provider]  bismuth subsalicylate (PEPTO BISMOL) 262  MG/15ML suspension Take 30 mLs by mouth every 6 (six) hours as needed for diarrhea or loose stools.   Yes [provider]  diphenhydramine -acetaminophen  (TYLENOL  PM) 25-500 MG TABS tablet Take 1 tablet by mouth at bedtime as needed (pain/sleep).   Yes [provider]  folic acid  (FOLVITE )  1 MG tablet Take 1 tablet (1 mg total) by mouth daily. 03/29/23  Yes Zarwolo, Gloria, FNP  gabapentin  (NEURONTIN ) 100 MG capsule Take 100 mg by mouth daily as needed (nerve pain).   Yes [provider]  meloxicam  (MOBIC ) 15 MG tablet Take 1 tablet (15 mg total) by mouth daily. 05/21/23  Yes Zarwolo, Gloria, FNP  omeprazole  (PRILOSEC) 40 MG capsule Take 1 capsule (40 mg total) by mouth daily. 04/11/23  Yes Mahon, Martine Sleek, NP  ondansetron  (ZOFRAN ) 4 MG tablet Take 1 tablet (4 mg total) by mouth 2 (two) times daily as needed for nausea or vomiting. 04/23/23  Yes Alison Irvine, FNP  pyridOXINE (VITAMIN B6) 50 MG tablet Take 1 tablet (50 mg total) by mouth daily. 03/29/23  Yes Zarwolo, Gloria, FNP  Vitamin D , Ergocalciferol , (DRISDOL ) 1.25 MG (50000 UNIT) CAPS capsule Take 1 capsule (50,000 Units total) by mouth every 7 (seven) days. 04/15/23  Yes Zarwolo, Gloria, FNP  Elastic Bandages & Supports (FITRITE BACK BRACE WITH PULLEY) MISC 1 Units by Does not apply route daily. 05/25/23   Alison Irvine, FNP  Misc. Devices MISC Closed toe walking boot for right foot toe fractures Dx: Z61.096 05/25/23   Alison Irvine, FNP    Current Facility-Administered Medications  Medication Dose Route Frequency Provider Last Rate Last Admin   0.9 % NaCl with KCl 20 mEq/ L  infusion   Intravenous Continuous Emokpae, Ejiroghene E, MD 75 mL/hr at 05/27/23 2152 New Bag at 05/27/23 2152   diphenhydrAMINE  (BENADRYL ) capsule 25 mg  25 mg Oral QHS PRN Alice Innocent, RPH   25 mg at 05/27/23 2149   And   acetaminophen  (TYLENOL ) tablet 500 mg  500 mg Oral QHS PRN Alice Innocent, RPH   500 mg at 05/27/23 2149   Chlorhexidine  Gluconate Cloth 2 % PADS 6 each  6 each Topical Daily Emokpae, Ejiroghene E, MD   6 each at 05/28/23 0809   folic acid  (FOLVITE ) tablet 1 mg  1 mg Oral Daily Emokpae, Ejiroghene E, MD   1 mg at 05/28/23 0801   LORazepam  (ATIVAN ) tablet 1-4 mg  1-4 mg Oral Q1H PRN Emokpae, Ejiroghene E, MD       Or    LORazepam  (ATIVAN ) injection 1-4 mg  1-4 mg Intravenous Q1H PRN Emokpae, Ejiroghene E, MD       morphine  (PF) 2 MG/ML injection 2 mg  2 mg Intravenous Q4H PRN Emokpae, Ejiroghene E, MD   2 mg at 05/28/23 0805   multivitamin with minerals tablet 1 tablet  1 tablet Oral Daily Emokpae, Ejiroghene E, MD   1 tablet at 05/28/23 0801   ondansetron  (ZOFRAN ) tablet 4 mg  4 mg Oral Q6H PRN Emokpae, Ejiroghene E, MD       Or   ondansetron  (ZOFRAN ) injection 4 mg  4 mg Intravenous Q6H PRN Emokpae, Ejiroghene E, MD   4 mg at 05/28/23 0803   pantoprazole  (PROTONIX ) injection 40 mg  40 mg Intravenous Q12H Emokpae, Ejiroghene E, MD   40 mg at 05/28/23 0803   polyethylene glycol (MIRALAX  / GLYCOLAX ) packet 17 g  17 g Oral Daily PRN Emokpae, Ejiroghene E, MD  sucralfate  (CARAFATE ) tablet 1 g  1 g Oral TID WC & HS Emokpae, Ejiroghene E, MD   1 g at 05/28/23 0801   thiamine  (VITAMIN B1) tablet 100 mg  100 mg Oral Daily Emokpae, Ejiroghene E, MD   100 mg at 05/28/23 0801   Or   thiamine  (VITAMIN B1) injection 100 mg  100 mg Intravenous Daily Emokpae, Ejiroghene E, MD        Allergies as of 05/27/2023 - Review Complete 05/27/2023  Allergen Reaction Noted   Bee venom Anaphylaxis 08/14/2020   Latex Rash 05/11/2023    Family History  Problem Relation Age of Onset   Cancer Mother    Leukemia Mother    Multiple myeloma Mother    Cancer Father        unsure what kind   Cancer - Colon Neg Hx    Colon polyps Neg Hx     Social History   Socioeconomic History   Marital status: Single    Spouse name: Not on file   Number of children: Not on file   Years of education: Not on file   Highest education level: Not on file  Occupational History   Not on file  Tobacco Use   Smoking status: Every Day    Current packs/day: 0.50    Average packs/day: 0.7 packs/day for 38.2 years (28.4 ttl pk-yrs)    Types: Cigarettes    Start date: 03/2022   Smokeless tobacco: Never  Vaping Use   Vaping status: Never Used   Substance and Sexual Activity   Alcohol use: Yes    Comment: beer, 4-6 /day   Drug use: Never   Sexual activity: Not Currently  Other Topics Concern   Not on file  Social History Narrative   Not on file   Social Drivers of Health   Financial Resource Strain: Not on file  Food Insecurity: No Food Insecurity (05/27/2023)   Hunger Vital Sign    Worried About Running Out of Food in the Last Year: Never true    Ran Out of Food in the Last Year: Never true  Transportation Needs: No Transportation Needs (05/27/2023)   PRAPARE - Administrator, Civil Service (Medical): No    Lack of Transportation (Non-Medical): No  Physical Activity: Not on file  Stress: Not on file  Social Connections: Not on file  Intimate Partner Violence: Not At Risk (05/27/2023)   Humiliation, Afraid, Rape, and Kick questionnaire    Fear of Current or Ex-Partner: No    Emotionally Abused: No    Physically Abused: No    Sexually Abused: No     Review of Systems   Gen: Denies any fever, chills, loss of appetite, change in weight or weight loss CV: Denies chest pain, heart palpitations, syncope, edema  Resp: Denies shortness of breath with rest, cough, wheezing, coughing up blood, and pleurisy. GI: Denies vomiting blood, jaundice, and fecal incontinence.   Denies dysphagia or odynophagia. GU : Denies urinary burning, blood in urine, urinary frequency, and urinary incontinence. MS: Denies joint pain, limitation of movement, swelling, cramps, and atrophy.  Derm: Denies rash, itching, dry skin, hives. Psych: Denies depression, anxiety, memory loss, hallucinations, and confusion. Heme: Denies bruising or bleeding Neuro:  Denies any headaches, dizziness, paresthesias, shaking  Physical Exam   Vital Signs in last 24 hours: Temp:  [98.1 F (36.7 C)-98.3 F (36.8 C)] 98.1 F (36.7 C) (05/19 0400) Pulse Rate:  [79-103] 99 (05/19 0632) Resp:  [11-20]  11 (05/19 1610) BP: (96-158)/(54-95) 113/60 (05/19  9604) SpO2:  [90 %-100 %] 100 % (05/19 5409) Weight:  [52.6 kg-54.7 kg] 54.7 kg (05/18 2056) Last BM Date : 05/26/23  General:   Alert,  Well-developed, well-nourished, pleasant and cooperative in NAD Head:  Normocephalic and atraumatic. Eyes:  Sclera clear, no icterus.   Conjunctiva pink. Ears:  Normal auditory acuity. Mouth:  No deformity or lesions, dentition normal. Neck:  Supple; no masses Lungs:  Clear throughout to auscultation.   No wheezes, crackles, or rhonchi. No acute distress. Heart:  Regular rate and rhythm; no murmurs, clicks, rubs,  or gallops. Abdomen:  round but soft, ventral hernia significantly tender to palpation, no obvious anasarca, no tense ascites Rectal: deferred   Msk:  Symmetrical without gross deformities. Normal posture. Extremities:  Without clubbing or edema. Neurologic:  Alert and  oriented x4. Skin:  Intact without significant lesions or rashes. Psych:  Alert and cooperative. Normal mood and affect.  Intake/Output from previous day: 05/18 0701 - 05/19 0700 In: 579.6 [I.V.:579.6] Out: 700 [Urine:700] Intake/Output this shift: No intake/output data recorded.    Labs/Studies   Recent Labs Recent Labs    05/27/23 1419 05/28/23 0400  WBC 8.7 6.4  HGB 9.3* 8.6*  HCT 28.2* 26.7*  PLT 229 203   BMET Recent Labs    05/27/23 1419 05/28/23 0400  NA 121* 129*  K 3.2* 4.3  CL 91* 101  CO2 21* 23  GLUCOSE 83 71  BUN <5* <5*  CREATININE 0.34* 0.40*  CALCIUM  7.8* 7.9*   LFT Recent Labs    05/27/23 1419  PROT 7.2  ALBUMIN 2.8*  AST 67*  ALT 23  ALKPHOS 80  BILITOT 1.0    Radiology/Studies CT ABDOMEN PELVIS W CONTRAST Result Date: 05/27/2023 CLINICAL DATA:  Acute abdominal pain EXAM: CT ABDOMEN AND PELVIS WITH CONTRAST TECHNIQUE: Multidetector CT imaging of the abdomen and pelvis was performed using the standard protocol following bolus administration of intravenous contrast. RADIATION DOSE REDUCTION: This exam was performed  according to the departmental dose-optimization program which includes automated exposure control, adjustment of the mA and/or kV according to patient size and/or use of iterative reconstruction technique. CONTRAST:  OMNIPAQUE  IOHEXOL  300 MG/ML  SOLN COMPARISON:  CT abdomen and pelvis 05/11/2023 FINDINGS: Lower chest: No acute abnormality. Hepatobiliary: The liver is enlarged with nodular liver contour similar to the prior study. There is a rounded hypodensity which is too small to characterize in the inferior right lobe of the liver which is unchanged. No new liver lesions are seen. Gallbladder and bile ducts are within normal limits. Pancreas: Unremarkable. No pancreatic ductal dilatation or surrounding inflammatory changes. Spleen: Normal in size without focal abnormality. Adrenals/Urinary Tract: Adrenal glands are unremarkable. Kidneys are normal, without renal calculi, focal lesion, or hydronephrosis. Bladder is unremarkable. Stomach/Bowel: The appendix is prominent in size measuring up to 1 cm. There is no surrounding inflammation. Is best seen on coronal image 4/51. There is no bowel obstruction, pneumatosis or free air. There is fluid distension of small bowel loops diffusely. There is wall thickening and edema of the distal gastric body and gastric antrum. There are findings worrisome for ulceration at the level of the gastric antrum along the anterior wall image 2/30. Vascular/Lymphatic: Portal vein and splenic vein appear patent. Aorta and IVC are normal in size. There are atherosclerotic calcifications of the aorta. There is recanalization of the umbilical vein with varices within an umbilical hernia similar to the prior study. No enlarged  lymph nodes are seen. Reproductive: Uterus and bilateral adnexa are unremarkable. Other: There is a small amount of free fluid throughout the abdomen and pelvis. Musculoskeletal: There is grade 1 anterolisthesis at L4-L5 which is unchanged. Mild chronic appearing  compression deformity of the superior endplate of T12 appears similar to the prior study. IMPRESSION: 1. Wall thickening and edema of the distal gastric body and gastric antrum worrisome for gastritis/peptic ulcer disease. There are findings worrisome for ulceration at the level of the gastric antrum along the anterior wall. Endoscopy recommended. 2. Appendix is prominent in size measuring up to 1 cm. There is no surrounding inflammation. Please correlate clinically for early acute appendicitis. 3. Diffuse fluid distension of small bowel loops worrisome for enteritis. 4. Small amount of free fluid throughout the abdomen and pelvis. 5. Stable hepatomegaly with nodular liver contour worrisome for cirrhosis. 6. 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 7. Aortic atherosclerosis. Aortic Atherosclerosis (ICD10-I70.0). Electronically Signed   By: Tyron Gallon M.D.   On: 05/27/2023 16:29     Assessment   INDEA DEARMAN is a 53 y.o. year old female with a history of ETOH abuse, cirrhosis felt likely due to ETOH, GERD, HTN, SVT, periumbilical hernia, chronic diarrhea, recent GI bleed due to PUD earlier this May, presenting with persistent abdominal pain. GI now consulted.  Abdominal pain: physical exam without tense ascites or obvious anasarca. Imaging without ascites. Lipase marginally elevated at 74 and not consistent with pancreatitis. CT imaging with wall thickening of gastric body and antrum, possible enteritis, stable cirrhosis findings. Known PUD with EGD during last hospitalization in early May with non-bleeding ulcers, and it appears she has not been taking PPI as outpatient due to feeling overwhelmed with multiple medications. As no ascites, low to no concern for SBP and suspect this is more r/t known PUD, chronic abdominal pain. Interestingly, she has had findings of enteritis on imaging in 2022. Will check inflammatory markers. No need for repeat EGD unless  obvious overt GI bleeding. Will need surveillance as outpatient.   Cirrhosis: MELD 3.0 today is 21, driven by hyponatremia. Etiology suspected due to ETOH. Attempting to wean down but still drinking 2 beers twice a week. Prior outpatient serologies in April 2025 with IgG  elevated likely in setting of alcohol use, (IgG >2000), ferritin mildly elevated at 165 and sats 20, not immune to Hep A or B. Other serologies unrevealing. AFP on file April 2025. Will need to follow IgG and potential biopsy as outpatient.   Chronic diarrhea: celiac negative in past, TSH normal, enteritis on prior CT 2022 including this admission, ?IBD, unable to rule out EPI, microscopic colitis, SIBO. Stool tests, fecal cal, fecal elastase, inflammatory markers ordered. Doubt infectious as this is chronic. Outpatient colonoscopy unless worsening while inpatient.   Possible liver lesion: on CT imaging in 2022 and also noted 2025 tha twas indeterminate. Normal AFP 2025.      Plan / Recommendations    Continue IV PPI BID, carafate  QID May have soft diet Agree with stool studies to include CDiff and GI path panel Daily MELD labs Check sed rate, CRP, fecal cal, fecal elastase (as collecting stool studies already) Will need colonoscopy as outpatient. Doubt will need inpatient unless does not clinically improve as symptoms are chronic EGD surveillance as outpatient and can perform colonoscopy at that time.  Will need further cirrhosis care as outpatient and recheck IgG once completely abstinent from ETOH. If continues to be elevated, will  need to consider liver biopsy Outpatient MRI due to indterminate liver lesion     05/28/2023, 8:21 AM  Delman Ferns, PhD, [facility]

## 2023-05-29 ENCOUNTER — Ambulatory Visit: Admitting: Orthopedic Surgery

## 2023-05-29 ENCOUNTER — Telehealth: Payer: Self-pay | Admitting: Gastroenterology

## 2023-05-29 DIAGNOSIS — F1090 Alcohol use, unspecified, uncomplicated: Secondary | ICD-10-CM | POA: Diagnosis not present

## 2023-05-29 DIAGNOSIS — Z720 Tobacco use: Secondary | ICD-10-CM | POA: Diagnosis not present

## 2023-05-29 DIAGNOSIS — R112 Nausea with vomiting, unspecified: Secondary | ICD-10-CM | POA: Diagnosis not present

## 2023-05-29 DIAGNOSIS — E871 Hypo-osmolality and hyponatremia: Secondary | ICD-10-CM | POA: Diagnosis not present

## 2023-05-29 LAB — COMPREHENSIVE METABOLIC PANEL WITH GFR
ALT: 19 U/L (ref 0–44)
AST: 56 U/L — ABNORMAL HIGH (ref 15–41)
Albumin: 2.5 g/dL — ABNORMAL LOW (ref 3.5–5.0)
Alkaline Phosphatase: 68 U/L (ref 38–126)
Anion gap: 5 (ref 5–15)
BUN: <5 mg/dL — ABNORMAL LOW (ref 6–20)
CO2: 26 mmol/L (ref 22–32)
Calcium: 8.3 mg/dL — ABNORMAL LOW (ref 8.9–10.3)
Chloride: 98 mmol/L (ref 98–111)
Creatinine, Ser: 0.38 mg/dL — ABNORMAL LOW (ref 0.44–1.00)
GFR, Estimated: >60 mL/min (ref >60)
Glucose, Bld: 87 mg/dL (ref 70–99)
Potassium: 3.7 mmol/L (ref 3.5–5.1)
Sodium: 129 mmol/L — ABNORMAL LOW (ref 135–145)
Total Bilirubin: 0.9 mg/dL (ref 0.0–1.2)
Total Protein: 6.3 g/dL — ABNORMAL LOW (ref 6.5–8.1)

## 2023-05-29 LAB — CBC
HCT: 24.8 % — ABNORMAL LOW (ref 36.0–46.0)
Hemoglobin: 8.3 g/dL — ABNORMAL LOW (ref 12.0–15.0)
MCH: 33.5 pg (ref 26.0–34.0)
MCHC: 33.5 g/dL (ref 30.0–36.0)
MCV: 100 fL (ref 80.0–100.0)
Platelets: 188 10*3/uL (ref 150–400)
RBC: 2.48 MIL/uL — ABNORMAL LOW (ref 3.87–5.11)
RDW: 14.6 % (ref 11.5–15.5)
WBC: 3.6 10*3/uL — ABNORMAL LOW (ref 4.0–10.5)
nRBC: 0 % (ref 0.0–0.2)

## 2023-05-29 LAB — MAGNESIUM: Magnesium: 1.7 mg/dL (ref 1.7–2.4)

## 2023-05-29 MED ORDER — SUCRALFATE 1 G PO TABS: 1.0000 g | ORAL_TABLET | Freq: Three times a day (TID) | ORAL | 1 refills | Status: DC

## 2023-05-29 MED ORDER — MAGNESIUM OXIDE -MG SUPPLEMENT 400 (240 MG) MG PO TABS
400.0000 mg | ORAL_TABLET | Freq: Once | ORAL | Status: AC
  Administered 2023-05-29: 400 mg via ORAL
  Filled 2023-05-29: qty 1

## 2023-05-29 MED ORDER — MAGNESIUM SULFATE 2 GM/50ML IV SOLN
2.0000 g | Freq: Once | INTRAVENOUS | Status: DC
  Filled 2023-05-29: qty 50

## 2023-05-29 MED ORDER — OMEPRAZOLE 40 MG PO CPDR: 40.0000 mg | DELAYED_RELEASE_CAPSULE | Freq: Two times a day (BID) | ORAL | 1 refills | Status: DC

## 2023-05-29 MED ORDER — ACETAMINOPHEN 500 MG PO TABS: 1000.0000 mg | ORAL_TABLET | Freq: Four times a day (QID) | ORAL | Status: DC | PRN

## 2023-05-29 MED ORDER — TRAMADOL HCL 50 MG PO TABS: 50.0000 mg | ORAL_TABLET | Freq: Four times a day (QID) | ORAL | 0 refills | Status: DC | PRN

## 2023-05-29 NOTE — Telephone Encounter (Signed)
 Patient discharged from the hospital today. I did not see her. She was admitted with abdominal pain felt to be secondary to PUD and possible enteritis on CT. Please arrange follow-up in 2 weeks with Julian Obey, NP who typically sees her in the outpatient setting or Karna Pacas, NP who saw her during this hospitalization.

## 2023-05-29 NOTE — Discharge Summary (Addendum)
 Physician Discharge Summary   Patient: Joan Wood MRN: [id number] DOB: [birth year]  Admit date:     05/27/2023  Discharge date: 05/29/23  Discharge Physician: Myrtie Atkinson Jaquia Benedicto   PCP: Zarwolo, Gloria, FNP   Recommendations at discharge:   Please follow up with primary care provider within 1-2 weeks  Please repeat BMP and CBC in one week    Hospital Course: 53 year old female with a history of alcohol abuse, tobacco abuse, hypertension, anxiety, depression, alcohol liver cirrhosis, and SVT presenting with at least 1 month history of upper abdominal pain associate with nausea, vomiting, and diarrhea.  The patient states that she has at least 3 episodes of emesis on a daily basis with multiple loose stools.  She continues to report intermittent melena and hematochezia.  Since her last d/c from the hospital, she states she largely has not been compliant with her meds.  She states she is "overwhelmed by the number of medications I have to take".   She she denies any chest pain but has some intermittent shortness of breath.  She denies fevers or chills.  She continues to smoke cigarettes.  She continues to drink beer.  Notably, the patient had recent hospitalization on 05/12/2023 to 05/14/23 with a similar presentation.  During that hospitalization she presented with a sodium 123 and hemoglobin 10.8.  Unfortunately, she left AMA.  She was readmitted from 05/15/2023 to 05/18/23. During this hospitalization, she presented with a sodium of 120 and hemoglobin 9.9.  She underwent EGD on 05/17/2023 which showed esophageal varices, nonbleeding gastric ulcers and esophagitis with plans for her to take protonix  BID, sulcralfate QID.  Her hyponatremia was attributed to poor solute intake and beer potomania.  Her sodium was 130 on the day of discharge.  In the ED, the patient was afebrile hemodynamically stable with oxygen saturation 99% on room air.  WBC 6.4, hemoglobin 9.6, platelets 203.  Sodium 121, potassium 3.2,  bicarbonate 21, serum creatinine 0.34.  The patient was started normal saline.  She was admitted for further evaluation and treatment.  GI was consulted to assist with management.  Assessment and Plan: Intractable nausea, vomiting, diarrhea - Secondary to gastroenteritis in the setting of continued NSAID and alcohol use - lipase 74 - Stool pathogen panel--no BM to collect -Check C. Difficile--no BM to collect - Pantoprazole  twice daily - Restarted Carafate  AC/HS - Full liquid diet>>advanced to soft diet 05/28/23>>tolerating - GI consult apprecaited>>outpt endoscopy is appropriate - Check COVID-19--neg   Hematochezia - Patient's hemoglobin has gradually drifted down from 13.2 on 04/11/23 - GI consult appreciated - Stop NSAIDs--was still taking meloxicam  - no signs of active blood loss during the hospitalization   Hyponatremia - Secondary to poor solute intake and volume depletion in the setting of alcohol use - initially received IVF - Saline lock IV fluids to prevent rapid rise in sodium - Monitor serial sodium -Na 129 on day of dc   Chronic abdominal pain/chronic back pain - 05/27/2023 CT AP--wall thickening and edema of the distal gastric body and gastric antrum; prominent appendix up to 1 cm without surrounding inflammation; diffuse distention of small bowel loops - I do not believe the patient likely has appendicitis - Judicious opioids - Patient is afebrile without leukocytosis - PDMP reviewed--tramadol , #20, filled 04/24/2023 - Norco 5/325, #10, filled 04/15/23   Alcohol abuse - Alcohol withdrawal protocol - no signs of withdraw during the hospitalization   Tobacco abuse - Tobacco cessation discussed   Hypokalemia - Replete - Magnesium  1.7  Alcoholic liver cirrhosis - Continue to drink beer - Appears compensated presently           Consultants: GI Procedures performed: none  Disposition: Home Diet recommendation:  Regular diet DISCHARGE  MEDICATION: Allergies as of 05/29/2023       Reactions   Bee Venom Anaphylaxis   Cannot take epi for this allergy d/t heart condition per pt   Latex Rash        Medication List     STOP taking these medications    diphenhydramine -acetaminophen  25-500 MG Tabs tablet Commonly known as: TYLENOL  PM   HYDROcodone -acetaminophen  5-325 MG tablet Commonly known as: NORCO/VICODIN   meloxicam  15 MG tablet Commonly known as: MOBIC        TAKE these medications    acetaminophen  500 MG tablet Commonly known as: TYLENOL  Take 2 tablets (1,000 mg total) by mouth every 6 (six) hours as needed for moderate pain (pain score 4-6). What changed: how much to take   bismuth subsalicylate 262 MG/15ML suspension Commonly known as: PEPTO BISMOL Take 30 mLs by mouth every 6 (six) hours as needed for diarrhea or loose stools.   Fitrite Back Brace with Pulley Misc 1 Units by Does not apply route daily.   folic acid  1 MG tablet Commonly known as: FOLVITE  Take 1 tablet (1 mg total) by mouth daily.   gabapentin  100 MG capsule Commonly known as: NEURONTIN  Take 100 mg by mouth daily as needed (nerve pain).   Misc. Devices Misc Closed toe walking boot for right foot toe fractures Dx: S92.301   omeprazole  40 MG capsule Commonly known as: PRILOSEC Take 1 capsule (40 mg total) by mouth 2 (two) times daily. What changed: when to take this   ondansetron  4 MG tablet Commonly known as: Zofran  Take 1 tablet (4 mg total) by mouth 2 (two) times daily as needed for nausea or vomiting.   pyridOXINE 50 MG tablet Commonly known as: VITAMIN B6 Take 1 tablet (50 mg total) by mouth daily.   sucralfate  1 g tablet Commonly known as: CARAFATE  Take 1 tablet (1 g total) by mouth 4 (four) times daily -  with meals and at bedtime.   traMADol  50 MG tablet Commonly known as: ULTRAM  Take 1 tablet (50 mg total) by mouth every 6 (six) hours as needed.   Vitamin D  (Ergocalciferol ) 1.25 MG (50000 UNIT) Caps  capsule Commonly known as: DRISDOL  Take 1 capsule (50,000 Units total) by mouth every 7 (seven) days.        Follow-up Information     [facility] Go to.   Why: Call or walk in for resources and assessment to help with Alcohol abuse. Contact information: [facility] [phone] [address]. [address] [place], [name] [address]               Discharge Exam: Cleavon Curls Weights   05/27/23 1345 05/27/23 2056  Weight: 52.6 kg 54.7 kg   HEENT:  [place]/AT, No thrush, no icterus CV:  RRR, no rub, no S3, no S4 Lung:  CTA, no wheeze, no rhonchi Abd:  soft/+BS, NT Ext:  No edema, no lymphangitis, no synovitis, no rash   Condition at discharge: stable  The results of significant diagnostics from this hospitalization (including imaging, microbiology, ancillary and laboratory) are listed below for reference.   Imaging Studies: CT ABDOMEN PELVIS W CONTRAST Result Date: 05/27/2023 CLINICAL DATA:  Acute abdominal pain EXAM: CT ABDOMEN AND PELVIS WITH CONTRAST TECHNIQUE: Multidetector CT imaging of the abdomen and pelvis  was performed using the standard protocol following bolus administration of intravenous contrast. RADIATION DOSE REDUCTION: This exam was performed according to the departmental dose-optimization program which includes automated exposure control, adjustment of the mA and/or kV according to patient size and/or use of iterative reconstruction technique. CONTRAST:  OMNIPAQUE  IOHEXOL  300 MG/ML  SOLN COMPARISON:  CT abdomen and pelvis 05/11/2023 FINDINGS: Lower chest: No acute abnormality. Hepatobiliary: The liver is enlarged with nodular liver contour similar to the prior study. There is a rounded hypodensity which is too small to characterize in the inferior right lobe of the liver which is unchanged. No new liver lesions are seen. Gallbladder and bile ducts are within normal limits. Pancreas: Unremarkable. No pancreatic ductal dilatation or  surrounding inflammatory changes. Spleen: Normal in size without focal abnormality. Adrenals/Urinary Tract: Adrenal glands are unremarkable. Kidneys are normal, without renal calculi, focal lesion, or hydronephrosis. Bladder is unremarkable. Stomach/Bowel: The appendix is prominent in size measuring up to 1 cm. There is no surrounding inflammation. Is best seen on coronal image 4/51. There is no bowel obstruction, pneumatosis or free air. There is fluid distension of small bowel loops diffusely. There is wall thickening and edema of the distal gastric body and gastric antrum. There are findings worrisome for ulceration at the level of the gastric antrum along the anterior wall image 2/30. Vascular/Lymphatic: Portal vein and splenic vein appear patent. Aorta and IVC are normal in size. There are atherosclerotic calcifications of the aorta. There is recanalization of the umbilical vein with varices within an umbilical hernia similar to the prior study. No enlarged lymph nodes are seen. Reproductive: Uterus and bilateral adnexa are unremarkable. Other: There is a small amount of free fluid throughout the abdomen and pelvis. Musculoskeletal: There is grade 1 anterolisthesis at L4-L5 which is unchanged. Mild chronic appearing compression deformity of the superior endplate of T12 appears similar to the prior study. IMPRESSION: 1. Wall thickening and edema of the distal gastric body and gastric antrum worrisome for gastritis/peptic ulcer disease. There are findings worrisome for ulceration at the level of the gastric antrum along the anterior wall. Endoscopy recommended. 2. Appendix is prominent in size measuring up to 1 cm. There is no surrounding inflammation. Please correlate clinically for early acute appendicitis. 3. Diffuse fluid distension of small bowel loops worrisome for enteritis. 4. Small amount of free fluid throughout the abdomen and pelvis. 5. Stable hepatomegaly with nodular liver contour worrisome for  cirrhosis. 6. 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 7. Aortic atherosclerosis. Aortic Atherosclerosis (ICD10-I70.0). Electronically Signed   By: Tyron Gallon M.D.   On: 05/27/2023 16:29   DG Foot Complete Right Result Date: 05/15/2023 CLINICAL DATA:  Prior fracture EXAM: RIGHT FOOT COMPLETE - 3+ VIEW COMPARISON:  04/15/2023 FINDINGS: Bony demineralization. Transverse fractures of the proximal metaphyses of the second, third, likely fourth metatarsals. These remain nondisplaced. No current malalignment at the Lisfranc joint. Dorsal soft tissue swelling along the forefoot. IMPRESSION: 1. Transverse fractures of the proximal metaphyses of the second, third, and likely fourth metatarsals. These remain nondisplaced. 2. Dorsal soft tissue swelling along the forefoot. 3. Bony demineralization. Electronically Signed   By: Freida Jes M.D.   On: 05/15/2023 12:47   CT Chest Wo Contrast Result Date: 05/12/2023 CLINICAL DATA:  53 year old female status post fall backwards yesterday. Cirrhosis. Syncope. Pain. EXAM: CT CHEST WITHOUT CONTRAST TECHNIQUE: Multidetector CT imaging of the chest was performed following the standard protocol without IV contrast. RADIATION DOSE REDUCTION: This  exam was performed according to the departmental dose-optimization program which includes automated exposure control, adjustment of the mA and/or kV according to patient size and/or use of iterative reconstruction technique. COMPARISON:  Cervical spine CT today. CT Abdomen and Pelvis yesterday. FINDINGS: Cardiovascular: Calcified aortic atherosclerosis. Calcified coronary artery atherosclerosis on series 2, image 80. No cardiomegaly. No pericardial effusion. Vascular patency is not evaluated in the absence of IV contrast. Mediastinum/Nodes: Negative for mediastinal mass or lymphadenopathy. Lungs/Pleura: Major airways are patent. Normal lung volumes. Both lungs are clear. No pleural  effusion. Upper Abdomen: Stable compared to the CT Abdomen and Pelvis yesterday. Musculoskeletal: Mild superior thoracic endplate compression fractures at T1, T3, T9 (moderate), T12. The latter 2 levels appear to be chronic. Underlying osteopenia. No retropulsion or complicating features. No rib fracture identified. Sternum and visible shoulder osseous structures appear intact. IMPRESSION: 1. Osteopenia with multiple generally mild thoracic compression fractures, T1, T3, T9, and T12. T1 and T3 appear age indeterminate. 2. No other acute traumatic injury identified in the noncontrast Chest. 3. Calcified coronary artery and Aortic Atherosclerosis (ICD10-I70.0). 4. Upper abdomen stable from CT Abdomen and Pelvis yesterday. Electronically Signed   By: Marlise Simpers M.D.   On: 05/12/2023 10:23   CT Cervical Spine Wo Contrast Result Date: 05/12/2023 CLINICAL DATA:  53 year old female status post fall backwards yesterday. Cirrhosis. Syncope. Pain. EXAM: CT CERVICAL SPINE WITHOUT CONTRAST TECHNIQUE: Multidetector CT imaging of the cervical spine was performed without intravenous contrast. Multiplanar CT image reconstructions were also generated. RADIATION DOSE REDUCTION: This exam was performed according to the departmental dose-optimization program which includes automated exposure control, adjustment of the mA and/or kV according to patient size and/or use of iterative reconstruction technique. COMPARISON:  Head CT today.  Chest CT reported separately. FINDINGS: Alignment: Straightening of cervical lordosis. Cervicothoracic junction alignment is within normal limits. Mild degenerative appearing retrolisthesis of C5 on C6 with associated disc space loss. Bilateral posterior element alignment is within normal limits. Skull base and vertebrae: Osteopenia. Visualized skull base is intact. No atlanto-occipital dissociation. C1 and C2 appear intact and aligned. No acute osseous abnormality identified. Soft tissues and spinal canal:  No prevertebral fluid or swelling. No visible canal hematoma. Negative visible noncontrast neck soft tissues. Disc levels: Mild for age cervical spine degeneration except at C5-C6 where disc space loss and asymmetric endplate spurring is associated with mild retrolisthesis. Probably no significant cervical spinal stenosis by CT. Upper chest: Subtle T1 superior endplate compression is age indeterminate. No retropulsion or complicating features. Lung apices are clear. IMPRESSION: 1. No acute traumatic injury identified in the cervical spine. 2. Osteopenia.  Chronic degeneration at C5-C6. 3. Mild T1 superior endplate compression fracture is age indeterminate. Electronically Signed   By: Marlise Simpers M.D.   On: 05/12/2023 10:19   CT Head Wo Contrast Result Date: 05/12/2023 CLINICAL DATA:  53 year old female status post fall backwards yesterday. Cirrhosis. Syncope. Pain. EXAM: CT HEAD WITHOUT CONTRAST TECHNIQUE: Contiguous axial images were obtained from the base of the skull through the vertex without intravenous contrast. RADIATION DOSE REDUCTION: This exam was performed according to the departmental dose-optimization program which includes automated exposure control, adjustment of the mA and/or kV according to patient size and/or use of iterative reconstruction technique. COMPARISON:  None Available. FINDINGS: Brain: Cerebral volume probably at the lower limits of normal for age. No midline shift, ventriculomegaly, mass effect, evidence of mass lesion, intracranial hemorrhage or evidence of cortically based acute infarction. Patchy, symmetric moderate bilateral cerebral white matter hypodensity which is  mostly periventricular. Vascular: No suspicious intracranial vascular hyperdensity. Calcified atherosclerosis at the skull base. Skull: Osteopenia. Intact. No acute osseous abnormality identified. Sinuses/Orbits: Visualized paranasal sinuses and mastoids are clear. Other: No acute orbit or scalp soft tissue injury  identified. IMPRESSION: 1. No acute traumatic injury identified. 2. Moderate cerebral white matter changes, most commonly due to small vessel disease. Electronically Signed   By: Marlise Simpers M.D.   On: 05/12/2023 10:17   CT ABDOMEN PELVIS W CONTRAST Result Date: 05/11/2023 CLINICAL DATA:  Acute generalized abdominal pain, nausea, vomiting. EXAM: CT ABDOMEN AND PELVIS WITH CONTRAST TECHNIQUE: Multidetector CT imaging of the abdomen and pelvis was performed using the standard protocol following bolus administration of intravenous contrast. RADIATION DOSE REDUCTION: This exam was performed according to the departmental dose-optimization program which includes automated exposure control, adjustment of the mA and/or kV according to patient size and/or use of iterative reconstruction technique. CONTRAST:  OMNIPAQUE  IOHEXOL  300 MG/ML  SOLN COMPARISON:  April 14, 2023.  August 14, 2020. FINDINGS: Lower chest: No acute abnormality. Hepatobiliary: Nodular hepatic contours are noted consistent with hepatic cirrhosis. No cholelithiasis or biliary dilatation is noted. Patent periumbilical vein is noted consistent with portal hypertension. Stable probable hemangioma seen inferiorly in right hepatic lobe. Pancreas: Unremarkable. No pancreatic ductal dilatation or surrounding inflammatory changes. Spleen: Normal in size without focal abnormality. Adrenals/Urinary Tract: Adrenal glands are unremarkable. Kidneys are normal, without renal calculi, focal lesion, or hydronephrosis. Bladder is unremarkable. Stomach/Bowel: Wall thickening of gastric antrum is noted suggesting possible inflammation or peptic ulcer disease. There is no evidence of bowel obstruction. The appendix is unremarkable. Vascular/Lymphatic: Aortic atherosclerosis. No enlarged abdominal or pelvic lymph nodes. Reproductive: Uterus and bilateral adnexa are unremarkable. Other: No ascites is noted. Musculoskeletal: No acute or significant osseous findings. IMPRESSION:  Hepatic cirrhosis is noted with recanalized periumbilical vein consistent with portal hypertension. Wall thickening of gastric antrum is noted suggesting possible inflammation or peptic ulcer disease. Aortic Atherosclerosis (ICD10-I70.0). Electronically Signed   By: Rosalene Colon M.D.   On: 05/11/2023 17:07    Microbiology: Results for orders placed or performed during the hospital encounter of 05/27/23  MRSA Next Gen by PCR, Nasal     Status: None   Collection Time: 05/27/23  7:51 PM   Specimen: Nasal Mucosa; Nasal Swab  Result Value Ref Range Status   MRSA by PCR Next Gen NOT DETECTED NOT DETECTED Final    Comment: (NOTE) The GeneXpert MRSA Assay (FDA approved for NASAL specimens only), is one component of a comprehensive MRSA colonization surveillance program. It is not intended to diagnose MRSA infection nor to guide or monitor treatment for MRSA infections. Test performance is not FDA approved in patients less than 20 years old. Performed at [facility], [address] [place], [name] [address]   Resp panel by RT-PCR (RSV, Flu A&B, Covid) Anterior Nasal Swab     Status: None   Collection Time: 05/28/23 10:20 AM   Specimen: Anterior Nasal Swab  Result Value Ref Range Status   SARS Coronavirus 2 by RT PCR NEGATIVE NEGATIVE Final    Comment: (NOTE) SARS-CoV-2 target nucleic acids are NOT DETECTED.  The SARS-CoV-2 RNA is generally detectable in upper respiratory specimens during the acute phase of infection. The lowest concentration of SARS-CoV-2 viral copies this assay can detect is 138 copies/mL. A negative result does not preclude SARS-Cov-2 infection and should not be used as the sole basis for treatment or other patient management decisions. A negative result may  occur with  improper specimen collection/handling, submission of specimen other than nasopharyngeal swab, presence of viral mutation(s) within the areas targeted by this assay, and inadequate number of  viral copies(<138 copies/mL). A negative result must be combined with clinical observations, patient history, and epidemiological information. The expected result is Negative.  Fact Sheet for Patients:  BloggerCourse.com  Fact Sheet for Healthcare Providers:  SeriousBroker.it  This test is no t yet approved or cleared by the [place]  FDA and  has been authorized for detection and/or diagnosis of SARS-CoV-2 by FDA under an Emergency Use Authorization (EUA). This EUA will remain  in effect (meaning this test can be used) for the duration of the COVID-19 declaration under Section 564(b)(1) of the Act, 21 U.S.C.section 360bbb-3(b)(1), unless the authorization is terminated  or revoked sooner.       Influenza A by PCR NEGATIVE NEGATIVE Final   Influenza B by PCR NEGATIVE NEGATIVE Final    Comment: (NOTE) The Xpert Xpress SARS-CoV-2/FLU/RSV plus assay is intended as an aid in the diagnosis of influenza from Nasopharyngeal swab specimens and should not be used as a sole basis for treatment. Nasal washings and aspirates are unacceptable for Xpert Xpress SARS-CoV-2/FLU/RSV testing.  Fact Sheet for Patients: BloggerCourse.com  Fact Sheet for Healthcare Providers: SeriousBroker.it  This test is not yet approved or cleared by the [place]  FDA and has been authorized for detection and/or diagnosis of SARS-CoV-2 by FDA under an Emergency Use Authorization (EUA). This EUA will remain in effect (meaning this test can be used) for the duration of the COVID-19 declaration under Section 564(b)(1) of the Act, 21 U.S.C. section 360bbb-3(b)(1), unless the authorization is terminated or revoked.     Resp Syncytial Virus by PCR NEGATIVE NEGATIVE Final    Comment: (NOTE) Fact Sheet for Patients: BloggerCourse.com  Fact Sheet for Healthcare  Providers: SeriousBroker.it  This test is not yet approved or cleared by the [place]  FDA and has been authorized for detection and/or diagnosis of SARS-CoV-2 by FDA under an Emergency Use Authorization (EUA). This EUA will remain in effect (meaning this test can be used) for the duration of the COVID-19 declaration under Section 564(b)(1) of the Act, 21 U.S.C. section 360bbb-3(b)(1), unless the authorization is terminated or revoked.  Performed at [facility], [address] [place], [name] [address]     Labs: CBC: Recent Labs  Lab 05/27/23 1419 05/28/23 0400 05/29/23 0458  WBC 8.7 6.4 3.6*  NEUTROABS 5.7  --   --   HGB 9.3* 8.6* 8.3*  HCT 28.2* 26.7* 24.8*  MCV 97.9 100.8* 100.0  PLT 229 203 188   Basic Metabolic Panel: Recent Labs  Lab 05/27/23 1419 05/28/23 0400 05/28/23 1043 05/29/23 0458  NA 121* 129* 124* 129*  K 3.2* 4.3 4.5 3.7  CL 91* 101 93* 98  CO2 21* 23 22 26   GLUCOSE 83 71 89 87  BUN <5* <5* <5* <5*  CREATININE 0.34* 0.40* 0.37* 0.38*  CALCIUM  7.8* 7.9* 8.7* 8.3*  MG 1.7  --   --  1.7   Liver Function Tests: Recent Labs  Lab 05/27/23 1419 05/28/23 1043 05/29/23 0458  AST 67* 57* 56*  ALT 23 22 19   ALKPHOS 80 80 68  BILITOT 1.0 1.4* 0.9  PROT 7.2 7.1 6.3*  ALBUMIN 2.8* 2.9* 2.5*   CBG: Recent Labs  Lab 05/28/23 1613  GLUCAP 104*    Discharge time spent: greater than 30 minutes.  Signed: Demaris Fillers, MD [facility] 05/29/2023

## 2023-05-29 NOTE — Plan of Care (Signed)
 Pt is alert and oriented x 4. Anxious and emotional at times. Up adlib.Tele in place. No tele events . Vitals stable. Morphine , Zofran  and benadryl  given x 1 so far this shift   Problem: Education: Goal: Knowledge of General Education information will improve Description: Including pain rating scale, medication(s)/side effects and non-pharmacologic comfort measures Outcome: Progressing   Problem: Health Behavior/Discharge Planning: Goal: Ability to manage health-related needs will improve Outcome: Progressing   Problem: Clinical Measurements: Goal: Ability to maintain clinical measurements within normal limits will improve Outcome: Progressing Goal: Will remain free from infection Outcome: Progressing Goal: Diagnostic test results will improve Outcome: Progressing Goal: Respiratory complications will improve Outcome: Progressing Goal: Cardiovascular complication will be avoided Outcome: Progressing   Problem: Activity: Goal: Risk for activity intolerance will decrease Outcome: Progressing   Problem: Nutrition: Goal: Adequate nutrition will be maintained Outcome: Progressing   Problem: Coping: Goal: Level of anxiety will decrease Outcome: Progressing   Problem: Elimination: Goal: Will not experience complications related to bowel motility Outcome: Progressing Goal: Will not experience complications related to urinary retention Outcome: Progressing   Problem: Pain Managment: Goal: General experience of comfort will improve and/or be controlled Outcome: Progressing   Problem: Safety: Goal: Ability to remain free from injury will improve Outcome: Progressing   Problem: Skin Integrity: Goal: Risk for impaired skin integrity will decrease Outcome: Progressing

## 2023-05-31 NOTE — ED Provider Notes (Signed)
 [facility]    CSN: [id number] Arrival date & time: 05/25/23  1626      History   Chief Complaint No chief complaint on file.   HPI Joan Wood is a 53 y.o. female.   Patient presenting today with 2 to 3-week history of progressively worsening abdominal bloating, abdominal pain, early satiety, fatigue, malaise.  She states she has a history of liver cirrhosis, peptic ulcer disease, gastritis, acute pancreatitis, alcohol abuse and has been admitted for similar as recently as 05/12/2023.  She states a paracentesis made her feel better and she thinks she needs another 1.  She is followed by a GI specialist who has seen her within the past week but she states the treatments being provided are not helping.  Denies melena, hematemesis, fevers, chills, urinary symptoms, intolerance to p.o.    Past Medical History:  Diagnosis Date   Anxiety    GERD (gastroesophageal reflux disease)    Hernia of abdominal wall    HTN (hypertension)    SVT (supraventricular tachycardia) (HCC)     Patient Active Problem List   Diagnosis Date Noted   Enteritis 05/28/2023   Intractable vomiting 05/28/2023   PUD (peptic ulcer disease) 05/27/2023   Alcoholic cirrhosis of liver with ascites (HCC) 05/27/2023   Hypomagnesemia 05/17/2023   Hyponatremia 05/15/2023   Tobacco abuse 05/15/2023   Acute hyponatremia 05/11/2023   Pancreatitis 05/11/2023   Gastritis 05/11/2023   Hypokalemia 05/11/2023   Multiple closed fractures of metatarsal bone with routine healing, right 04/24/2023   Neuropathy 03/31/2023   Irritable bowel syndrome with diarrhea 03/31/2023   GERD (gastroesophageal reflux disease) 03/31/2023   Nausea and vomiting 03/31/2023   Alcohol use disorder 03/28/2022   Left hip pain 03/28/2022   Periumbilical hernia 03/28/2022   Adjustment disorder with mixed anxiety and depressed mood 12/15/2014   Malingering 12/14/2014    Past Surgical History:  Procedure Laterality Date    ESOPHAGOGASTRODUODENOSCOPY N/A 05/17/2023   Procedure: EGD (ESOPHAGOGASTRODUODENOSCOPY);  Surgeon: Vinetta Greening, DO;  Location[facility];  Service: Endoscopy;  Laterality: N/A;   FINGER FRACTURE SURGERY     right ankle repair     age 7    OB History   No obstetric history on file.      Home Medications    Prior to Admission medications   Medication Sig Start Date End Date Taking? Authorizing Provider  acetaminophen  (TYLENOL ) 500 MG tablet Take 2 tablets (1,000 mg total) by mouth every 6 (six) hours as needed for moderate pain (pain score 4-6). 05/29/23   Demaris Fillers, MD  bismuth subsalicylate (PEPTO BISMOL) 262 MG/15ML suspension Take 30 mLs by mouth every 6 (six) hours as needed for diarrhea or loose stools.    [provider]  Elastic Bandages & Supports (FITRITE BACK BRACE WITH PULLEY) MISC 1 Units by Does not apply route daily. 05/25/23   Alison Irvine, FNP  folic acid  (FOLVITE ) 1 MG tablet Take 1 tablet (1 mg total) by mouth daily. 03/29/23   Zarwolo, Gloria, FNP  gabapentin  (NEURONTIN ) 100 MG capsule Take 100 mg by mouth daily as needed (nerve pain).    [provider]  Misc. Devices MISC Closed toe walking boot for right foot toe fractures Dx: V42.595 05/25/23   Alison Irvine, FNP  omeprazole  (PRILOSEC) 40 MG capsule Take 1 capsule (40 mg total) by mouth 2 (two) times daily. 05/29/23   Demaris Fillers, MD  ondansetron  (ZOFRAN ) 4 MG tablet Take 1 tablet (4 mg  total) by mouth 2 (two) times daily as needed for nausea or vomiting. 04/23/23   Alison Irvine, FNP  pyridOXINE (VITAMIN B6) 50 MG tablet Take 1 tablet (50 mg total) by mouth daily. 03/29/23   Zarwolo, Gloria, FNP  sucralfate  (CARAFATE ) 1 g tablet Take 1 tablet (1 g total) by mouth 4 (four) times daily -  with meals and at bedtime. 05/29/23   Demaris Fillers, MD  traMADol  (ULTRAM ) 50 MG tablet Take 1 tablet (50 mg total) by mouth every 6 (six) hours as needed. 05/29/23   Demaris Fillers, MD  Vitamin D , Ergocalciferol ,  (DRISDOL ) 1.25 MG (50000 UNIT) CAPS capsule Take 1 capsule (50,000 Units total) by mouth every 7 (seven) days. 04/15/23   Zarwolo, Gloria, FNP    Family History Family History  Problem Relation Age of Onset   Cancer Mother    Leukemia Mother    Multiple myeloma Mother    Cancer Father        unsure what kind   Cancer - Colon Neg Hx    Colon polyps Neg Hx     Social History Social History   Tobacco Use   Smoking status: Every Day    Current packs/day: 0.50    Average packs/day: 0.7 packs/day for 38.2 years (28.4 ttl pk-yrs)    Types: Cigarettes    Start date: 03/2022   Smokeless tobacco: Never  Vaping Use   Vaping status: Never Used  Substance Use Topics   Alcohol use: Yes    Comment: beer, 4-6 /day   Drug use: Never     Allergies   Bee venom and Latex   Review of Systems Review of Systems HPI  Physical Exam Triage Vital Signs ED Triage Vitals  Encounter Vitals Group     BP 05/25/23 1703 127/82     Systolic BP Percentile --      Diastolic BP Percentile --      Pulse Rate 05/25/23 1703 (!) 104     Resp 05/25/23 1703 18     Temp 05/25/23 1703 98.6 F (37 C)     Temp Source 05/25/23 1703 Oral     SpO2 05/25/23 1703 97 %     Weight --      Height --      Head Circumference --      Peak Flow --      Pain Score 05/25/23 1706 10     Pain Loc --      Pain Education --      Exclude from Growth Chart --    No data found.  Updated Vital Signs BP 127/82 (BP Location: Right Arm)   Pulse (!) 104   Temp 98.6 F (37 C) (Oral)   Resp 18   SpO2 97%   Visual Acuity Right Eye Distance:   Left Eye Distance:   Bilateral Distance:    Right Eye Near:   Left Eye Near:    Bilateral Near:     Physical Exam Vitals and nursing note reviewed.  Constitutional:      Appearance: Normal appearance. She is not ill-appearing.  HENT:     Head: Atraumatic.     Mouth/Throat:     Mouth: Mucous membranes are moist.  Eyes:     Extraocular Movements: Extraocular  movements intact.     Conjunctiva/sclera: Conjunctivae normal.  Cardiovascular:     Rate and Rhythm: Normal rate and regular rhythm.     Heart sounds: Normal heart sounds.  Pulmonary:  Effort: Pulmonary effort is normal.     Breath sounds: Normal breath sounds.  Abdominal:     Comments: Generalized abdominal tenderness to palpation with guarding.  Does appear distended  Musculoskeletal:        General: Normal range of motion.     Cervical back: Normal range of motion and neck supple.  Skin:    General: Skin is warm and dry.  Neurological:     Mental Status: She is alert and oriented to person, place, and time.  Psychiatric:        Mood and Affect: Mood normal.        Thought Content: Thought content normal.        Judgment: Judgment normal.      UC Treatments / Results  Labs (all labs ordered are listed, but only abnormal results are displayed) Labs Reviewed - No data to display  EKG   Radiology No results found.  Procedures Procedures (including critical care time)  Medications Ordered in UC Medications - No data to display  Initial Impression / Assessment and Plan / UC Course  I have reviewed the triage vital signs and the nursing notes.  Pertinent labs & imaging results that were available during my care of the patient were reviewed by me and considered in my medical decision making (see chart for details).     Complicated past medical history, likely ascites secondary to cirrhosis but given extent of discomfort, shortness of breath, early satiety associated with abdominal discomfort and distention recommend to the emergency department.  Reviewed at length with her that we are unable to scan her abdomen, draw fluid or anything along these lines that she is hoping to receive today.  She declines EMS transport and wishes to go private vehicle with her roommate.  Currently hemodynamically stable for this transport.  Final Clinical Impressions(s) / UC Diagnoses    Final diagnoses:  Abdominal bloating  SOB (shortness of breath)  Hepatic cirrhosis, unspecified hepatic cirrhosis type, unspecified whether ascites present [facility])     Discharge Instructions      Go to the emergency department for further evaluation.  We do not recommend that you drive yourself  ED Prescriptions   None    PDMP not reviewed this encounter.   Corbin Dess, [place] 05/31/23 1141

## 2023-06-01 NOTE — Telephone Encounter (Signed)
 See previous encounter

## 2023-06-04 DIAGNOSIS — R1084 Generalized abdominal pain: Secondary | ICD-10-CM

## 2023-06-04 DIAGNOSIS — R112 Nausea with vomiting, unspecified: Secondary | ICD-10-CM

## 2023-06-18 ENCOUNTER — Inpatient Hospital Stay: Admitting: Gastroenterology

## 2023-06-18 ENCOUNTER — Encounter: Payer: Self-pay | Admitting: Gastroenterology

## 2023-06-18 NOTE — Progress Notes (Deleted)
 GI Office Note    Referring Provider: Zarwolo, Gloria, FNP Primary Care Physician:  Zarwolo, Gloria, FNP Primary Gastroenterologist: Rolando Cliche. Mordechai April, DO  Date:  06/18/2023  ID:  Joan Wood, DOB August 30, 1970, MRN [id number]   Chief Complaint   No chief complaint on file.  History of Present Illness  Joan Wood is a 53 y.o. female with a history of alcohol abuse, HTN, SVT, GERD, periumbilical hernia, anxiety, and IBS with diarrhea presenting today for hospital follow-up.  CT A/P in August 2022: -Extensive inflammatory change involving single loop of small bowel in the right lower quadrant (infectious or inflammatory enteritis) -Moderate hepatic steatosis and mild hepatomegaly -Evidence of portal venous hypertension (recanalization of umbilical vein) -Recommended hepatic elastography for further management -Indeterminate low-attenuation lesion within the right hepatic lobe (advised MRI if no comparison with prior examination available)   Labs 03/28/22: Cr 0.55, Na 133, Albumin 4.5, Tbili 0.5, Alk Phos 104, AST 77, ALT 28, Plts 142, Hgb 13.8   Seen recently by general surgery for consideration of umbilical hernia repair given tenderness on exam she was noted to be drinking 4-6 beers daily.  She was noted to be high risk for surgical interventions given possibility of cirrhosis given her chronic alcohol use therefore she was recommended to see GI for management of liver disease.  She was advised that she was not a good candidate for surgical intervention at this time and that her liver disease needed to be better optimized and when she is optimized she may would benefit from intervention at tertiary care facility.   OV 05/01/22.  Reported daily generalized abdominal pain, slightly increased in the upper abdomen.  Have been having ongoing abdominal distention.  Denied peripheral edema.  Having 5-6 stools with loose.  Symptoms ongoing once a day.  Drinking 4-6 daily, twice a year she may  drink 8.  Denies liquor or wine.  Denies jaundice or pruritus.  Does admit to being forgetful but denied any overt confusion or disorientation.  Having nausea and diarrhea on a daily basis as well as acid reflux.  Also lots of burping along with her epigastric pain.  Symptoms ongoing for about a year.  Has a poor appetite and early satiety but denied any weight loss.  History of rectal bleeding for majority of the month, history of hemorrhoids.  Denied any active rectal pain at the time.  Smokes half pack per day.  Reported a colonoscopy in her 30s with benign polyps performed at [facility].  Advised omeprazole  40 mg once daily, 30 minutes prior to breakfast.  GERD diet discussed.  Recommended folic acid  and thiamine  supplements.  Ordered EGD and colonoscopy.  Advised to continue Zofran  as needed.  MRI liver recommended given prior liver lesion noted on CT in 2022.  Advised to check labs including autoimmune serologies, iron panel, and viral hepatitis panel given concern for cirrhosis. 3 month follow up recommended.    We attempted to call her for procedures as well as MRI, leaving messages but never received a call back.  MRI was not performed.  Procedures were not scheduled.   Recently seen by PCP 3/20 with reports of diarrhea with every meal.  Also previously reported seeing blood in stools.  Currently requesting GI referral.  Also reported some reflux symptoms, she is encouraged to start taking her omeprazole  40 mg once daily.  Counseled on GERD lifestyle modifications.  Given Zofran  to help with nausea and vomiting  Last office visit 04/11/23.  Reported vomiting  and/or diarrhea most mornings, has had some accidents due to significant urgency and this happening more frequently.  Denied having any diarrhea throughout the day or after meals.  Did report being anorexic in the 61s and reports she does want to eat however not usually able to eat because of vomiting and diarrhea.  Even crackers or bread she is only  able to take a few bites.  Denied any omeprazole  use.  Stated she was having large hemorrhoids and occasionally has some bleeding that occurs about 1-2 times a week that may last for about an hour after bowel movement.  Reports still smoking and sometimes vomiting comes out of nowhere without any prodromal nausea.  Has only taken Imodium  once.  Does report some shortness of breath related to ventral hernia.  Admits to 2-3 beers daily for which she uses to help her sleep.  Denies melena.  Schedule patient for EGD and colonoscopy with Dr. Mordechai April.  Advise MRI of her liver given concern for early cirrhosis and prior liver lesion.  CT A/P with contrast ordered.  Labs including autoimmune serologies, iron panel, AFP, and viral hepatitis panel ordered advised to continue folic acid  and thiamine  supplementation, check fecal elastase given diarrhea with consideration to trial pancreatic enzymes if needed.  Continue Imodium  and Zofran  as needed.  Continue omeprazole  nightly.  Given general surgery referral regarding periumbilical hernia.  Seen during hospitalization 5/3 5/5 where she left AMA and then returned for admission 5/6- 5/9***.  GI was consulted given concern for upper GI bleed.  She was noted to be a poor historian and stated for the last month she was having recurrent diarrhea, abdominal pain, nausea and vomiting but she had reported that for the prior 5 days she had not had a bowel movement was fearful of eating due to diarrhea.  Denied hematochezia at that time.  Noted to be actively drinking alcohol for multiple years.  In the ED she was tachycardic with normal BP and was afebrile.  Hemoglobin was 9.1, platelets 91, sodium 118, potassium 2.8, creatinine 0.43, AST 120, ALT 40, T. bili 1.9, AP 72.  CT A/P with contrast showed changes of hepatic cirrhosis with recannulized periumbilical vein concerning for portal hypertension with wall thickening of the gastric antrum.  Given hyponatremia she was transferred to  the ICU.  CBC was trended.  She was started on pantoprazole  40 mg twice daily IV, octreotide  drip, Rocephin  for SBP prophylaxis.  Stool studies ordered as well as celiac panel.  CIWA protocol ordered.  Plan was to undergo EGD once sodium improved to at least high 120s.  During her brief less than 24 hours out of the hospital she reportedly had intake of 2 beers.  She reported ongoing pain in the epigastric region.  She ultimately underwent EGD 05/17/23 with evidence of grade D reflux esophagitis without bleeding, portal hypertensive gastropathy, nonbleeding gastric ulcer with clean ulcer base measuring 15 mm s/p biopsy and other nonbleeding gastric ulcers with no recent bleeding, and normal duodenum.  She was advised to continue PPI twice daily, Carafate  4 times daily, avoid NSAIDs, and repeat EGD in 8-10 weeks with colonoscopy at that time.  Subsequently had another hospitalization 5/18-5/20 where she presented with complains of abdominal pain.  CT A/P with contrast revealed wall thickening in the gastric body and antrum, prominent appendix without inflammation, diffuse fluid distention of the small bowel loops concerning for enteritis, small amount of free fluid throughout abdomen pelvis, stable hepatomegaly with cirrhosis and signs of portal  hypertension.  Hemoglobin was noted to be improved since her prior discharge, ongoing hyponatremia with sodium 121 and potassium 3.2.  Lipase 74.  AST remained elevated however improved from prior hospitalization.  She was unsure if she was taking PPI or Carafate .  Plan was to continue PPI and Carafate , soft diet, check stool studies given ongoing reports of chronic diarrhea.  Advised to check sed rate, CRP, fecal Cal, and elastase.  Again discussed outpatient colonoscopy and repeat EGD surveillance.  Encouraged cirrhosis care outpatient and to recheck IgG when she was completely abstinent from EtOH use, if continues to be elevated then consider liver biopsy.  Agreed with  outpatient MRI due to indeterminant liver lesion.  Today:    Wt Readings from Last 3 Encounters:  05/27/23 120 lb 8 oz (54.7 kg)  05/15/23 116 lb 9.6 oz (52.9 kg)  05/12/23 123 lb 14.4 oz (56.2 kg)    Current Outpatient Medications  Medication Sig Dispense Refill   acetaminophen  (TYLENOL ) 500 MG tablet Take 2 tablets (1,000 mg total) by mouth every 6 (six) hours as needed for moderate pain (pain score 4-6).     bismuth subsalicylate (PEPTO BISMOL) 262 MG/15ML suspension Take 30 mLs by mouth every 6 (six) hours as needed for diarrhea or loose stools.     Elastic Bandages & Supports (FITRITE BACK BRACE WITH PULLEY) MISC 1 Units by Does not apply route daily. 1 each 0   folic acid  (FOLVITE ) 1 MG tablet Take 1 tablet (1 mg total) by mouth daily. 60 tablet 1   gabapentin  (NEURONTIN ) 100 MG capsule Take 100 mg by mouth daily as needed (nerve pain).     Misc. Devices MISC Closed toe walking boot for right foot toe fractures Dx: S92.301 1 each 0   omeprazole  (PRILOSEC) 40 MG capsule Take 1 capsule (40 mg total) by mouth 2 (two) times daily. 60 capsule 1   ondansetron  (ZOFRAN ) 4 MG tablet Take 1 tablet (4 mg total) by mouth 2 (two) times daily as needed for nausea or vomiting. 20 tablet 0   pyridOXINE (VITAMIN B6) 50 MG tablet Take 1 tablet (50 mg total) by mouth daily. 30 tablet 1   sucralfate  (CARAFATE ) 1 g tablet Take 1 tablet (1 g total) by mouth 4 (four) times daily -  with meals and at bedtime. 120 tablet 1   traMADol  (ULTRAM ) 50 MG tablet Take 1 tablet (50 mg total) by mouth every 6 (six) hours as needed. 10 tablet 0   Vitamin D , Ergocalciferol , (DRISDOL ) 1.25 MG (50000 UNIT) CAPS capsule Take 1 capsule (50,000 Units total) by mouth every 7 (seven) days. 25 capsule 1   No current facility-administered medications for this visit.    Past Medical History:  Diagnosis Date   Anxiety    GERD (gastroesophageal reflux disease)    Hernia of abdominal wall    HTN (hypertension)    SVT  (supraventricular tachycardia) (HCC)     Past Surgical History:  Procedure Laterality Date   ESOPHAGOGASTRODUODENOSCOPY N/A 05/17/2023   Procedure: EGD (ESOPHAGOGASTRODUODENOSCOPY);  Surgeon: Vinetta Greening, DO;  Location: [facility];  Service: Endoscopy;  Laterality: N/A;   FINGER FRACTURE SURGERY     right ankle repair     age 42    Family History  Problem Relation Age of Onset   Cancer Mother    Leukemia Mother    Multiple myeloma Mother    Cancer Father        unsure what kind   Cancer -  Colon Neg Hx    Colon polyps Neg Hx     Allergies as of 06/18/2023 - Review Complete 05/27/2023  Allergen Reaction Noted   Bee venom Anaphylaxis 08/14/2020   Latex Rash 05/11/2023    Social History   Socioeconomic History   Marital status: Single    Spouse name: Not on file   Number of children: Not on file   Years of education: Not on file   Highest education level: Not on file  Occupational History   Not on file  Tobacco Use   Smoking status: Every Day    Current packs/day: 0.50    Average packs/day: 0.7 packs/day for 38.3 years (28.4 ttl pk-yrs)    Types: Cigarettes    Start date: 03/2022   Smokeless tobacco: Never  Vaping Use   Vaping status: Never Used  Substance and Sexual Activity   Alcohol use: Yes    Comment: beer, 4-6 /day   Drug use: Never   Sexual activity: Not Currently  Other Topics Concern   Not on file  Social History Narrative   Not on file   Social Drivers of Health   Financial Resource Strain: Not on file  Food Insecurity: No Food Insecurity (05/27/2023)   Hunger Vital Sign    Worried About Running Out of Food in the Last Year: Never true    Ran Out of Food in the Last Year: Never true  Transportation Needs: No Transportation Needs (05/27/2023)   PRAPARE - Administrator, Civil Service (Medical): No    Lack of Transportation (Non-Medical): No  Physical Activity: Not on file  Stress: Not on file  Social Connections: Not on file      Review of Systems   Gen: Denies fever, chills, anorexia. Denies fatigue, weakness, weight loss.  CV: Denies chest pain, palpitations, syncope, peripheral edema, and claudication. Resp: Denies dyspnea at rest, cough, wheezing, coughing up blood, and pleurisy. GI: See HPI Derm: Denies rash, itching, dry skin Psych: Denies depression, anxiety, memory loss, confusion. No homicidal or suicidal ideation.  Heme: Denies bruising, bleeding, and enlarged lymph nodes.  Physical Exam   There were no vitals taken for this visit.  General:   Alert and oriented. No distress noted. Pleasant and cooperative.  Head:  Normocephalic and atraumatic. Eyes:  Conjuctiva clear without scleral icterus. Mouth:  Oral mucosa pink and moist. Good dentition. No lesions. Lungs:  Clear to auscultation bilaterally. No wheezes, rales, or rhonchi. No distress.  Heart:  S1, S2 present without murmurs appreciated.  Abdomen:  +BS, soft, non-tender and non-distended. No rebound or guarding. No HSM or masses noted. Rectal: *** Msk:  Symmetrical without gross deformities. Normal posture. Extremities:  Without edema. Neurologic:  Alert and  oriented x4 Psych:  Alert and cooperative. Normal mood and affect.  Assessment  Joan Wood is a 53 y.o. female with a history of *** presenting today with   Gastric ulcer:  Anemia:  Cirrhosis:  PLAN   ***     Julian Obey, MSN, FNP-BC, AGACNP-BC [facility]

## 2023-07-30 ENCOUNTER — Encounter: Payer: Self-pay | Admitting: Family Medicine

## 2023-07-30 ENCOUNTER — Ambulatory Visit: Admitting: Family Medicine

## 2023-07-30 VITALS — BP 135/85 | HR 104 | Ht 62.0 in | Wt 122.0 lb

## 2023-07-30 DIAGNOSIS — R55 Syncope and collapse: Secondary | ICD-10-CM

## 2023-07-30 DIAGNOSIS — E559 Vitamin D deficiency, unspecified: Secondary | ICD-10-CM

## 2023-07-30 DIAGNOSIS — E038 Other specified hypothyroidism: Secondary | ICD-10-CM

## 2023-07-30 DIAGNOSIS — R7301 Impaired fasting glucose: Secondary | ICD-10-CM | POA: Diagnosis not present

## 2023-07-30 DIAGNOSIS — E7849 Other hyperlipidemia: Secondary | ICD-10-CM | POA: Diagnosis not present

## 2023-07-30 DIAGNOSIS — F339 Major depressive disorder, recurrent, unspecified: Secondary | ICD-10-CM | POA: Diagnosis not present

## 2023-07-30 MED ORDER — SERTRALINE HCL 25 MG PO TABS: 25.0000 mg | ORAL_TABLET | Freq: Every day | ORAL | 3 refills | Status: DC

## 2023-07-30 NOTE — Progress Notes (Signed)
 Established Patient Office Visit  Subjective:  Patient ID: Joan Wood, female    DOB: Aug 22, 1970  Age: 53 y.o. MRN: [id number]  CC:  Chief Complaint  Patient presents with   Loss of Consciousness    Fainting due to low sodium    Care Management    Four month follow up     HPI Joan Wood is a 53 y.o. female with past medical history of liver cirrhosis, GERD, generalized abdominal pain presents for f/u of  chronic medical conditions.  The patient presents with a history of Supraventricular Tachycardia (SVT), presenting with recurrent syncope episodes, occurring about twice a month for the past year. The episodes are triggered by standing up to go to the bathroom, with sudden dizziness, spinning, and blacking out. She also reports seeing colored speckles. Each episode is brief, without chest pain, palpitations, or shortness of breath. The patient denies any recent change in her SVT symptoms. The syncope may be arrhythmic in nature given her SVT history, but this requires further evaluation. Last epodisoe was 10 days ago.  Usually occuse in the morning. Dont get up fast, doent walk fast  Depression: The patient reports experiencing symptoms of depression for the past year, including feelings of worthlessness, hopelessness, and persistent sadness. She expresses a desire to feel like her "regular" self again and to get out of the house. She notes a significant change in her personality--once sociable and loving, she now avoids people and dislikes leaving her home. She feels emotionally overwhelmed and frustrated, attributing it to being confined to the house and bed. The patient denies suicidal or homicidal ideation, as well as auditory or visual hallucinations. She is open to therapy and expresses a strong desire to speak with a mental health professional.   Past Medical History:  Diagnosis Date   Anxiety    GERD (gastroesophageal reflux disease)    Hernia of abdominal wall    HTN  (hypertension)    SVT (supraventricular tachycardia) (HCC)     Past Surgical History:  Procedure Laterality Date   ESOPHAGOGASTRODUODENOSCOPY N/A 05/17/2023   Procedure: EGD (ESOPHAGOGASTRODUODENOSCOPY);  Surgeon: Cindie Carlin POUR, DO;  Location: [facility];  Service: Endoscopy;  Laterality: N/A;   FINGER FRACTURE SURGERY     right ankle repair     age 52    Family History  Problem Relation Age of Onset   Cancer Mother    Leukemia Mother    Multiple myeloma Mother    Cancer Father        unsure what kind   Cancer - Colon Neg Hx    Colon polyps Neg Hx     Social History   Socioeconomic History   Marital status: Single    Spouse name: Not on file   Number of children: Not on file   Years of education: Not on file   Highest education level: Not on file  Occupational History   Not on file  Tobacco Use   Smoking status: Every Day    Current packs/day: 0.50    Average packs/day: 0.7 packs/day for 38.4 years (28.4 ttl pk-yrs)    Types: Cigarettes    Start date: 03/2022   Smokeless tobacco: Never  Vaping Use   Vaping status: Never Used  Substance and Sexual Activity   Alcohol use: Yes    Comment: beer, 4-6 /day   Drug use: Never   Sexual activity: Not Currently  Other Topics Concern   Not on file  Social  History Narrative   Not on file   Social Drivers of Health   Financial Resource Strain: Not on file  Food Insecurity: No Food Insecurity (05/27/2023)   Hunger Vital Sign    Worried About Running Out of Food in the Last Year: Never true    Ran Out of Food in the Last Year: Never true  Transportation Needs: No Transportation Needs (05/27/2023)   PRAPARE - Administrator, Civil Service (Medical): No    Lack of Transportation (Non-Medical): No  Physical Activity: Not on file  Stress: Not on file  Social Connections: Not on file  Intimate Partner Violence: Not At Risk (05/27/2023)   Humiliation, Afraid, Rape, and Kick questionnaire    Fear of Current  or Ex-Partner: No    Emotionally Abused: No    Physically Abused: No    Sexually Abused: No    Outpatient Medications Prior to Visit  Medication Sig Dispense Refill   acetaminophen  (TYLENOL ) 500 MG tablet Take 2 tablets (1,000 mg total) by mouth every 6 (six) hours as needed for moderate pain (pain score 4-6).     bismuth subsalicylate (PEPTO BISMOL) 262 MG/15ML suspension Take 30 mLs by mouth every 6 (six) hours as needed for diarrhea or loose stools.     Elastic Bandages & Supports (FITRITE BACK BRACE WITH PULLEY) MISC 1 Units by Does not apply route daily. 1 each 0   folic acid  (FOLVITE ) 1 MG tablet Take 1 tablet (1 mg total) by mouth daily. 60 tablet 1   gabapentin  (NEURONTIN ) 100 MG capsule Take 100 mg by mouth daily as needed (nerve pain).     Misc. Devices MISC Closed toe walking boot for right foot toe fractures Dx: S92.301 1 each 0   omeprazole  (PRILOSEC) 40 MG capsule Take 1 capsule (40 mg total) by mouth 2 (two) times daily. 60 capsule 1   ondansetron  (ZOFRAN ) 4 MG tablet Take 1 tablet (4 mg total) by mouth 2 (two) times daily as needed for nausea or vomiting. 20 tablet 0   pyridOXINE (VITAMIN B6) 50 MG tablet Take 1 tablet (50 mg total) by mouth daily. 30 tablet 1   sucralfate  (CARAFATE ) 1 g tablet Take 1 tablet (1 g total) by mouth 4 (four) times daily -  with meals and at bedtime. 120 tablet 1   traMADol  (ULTRAM ) 50 MG tablet Take 1 tablet (50 mg total) by mouth every 6 (six) hours as needed. 10 tablet 0   Vitamin D , Ergocalciferol , (DRISDOL ) 1.25 MG (50000 UNIT) CAPS capsule Take 1 capsule (50,000 Units total) by mouth every 7 (seven) days. 25 capsule 1   No facility-administered medications prior to visit.    Allergies  Allergen Reactions   Bee Venom Anaphylaxis    Cannot take epi for this allergy d/t heart condition per pt   Latex Rash    ROS Review of Systems  Constitutional:  Negative for chills and fever.  Eyes:  Negative for visual disturbance.  Respiratory:   Negative for chest tightness and shortness of breath.   Cardiovascular:        Frequent syncope episodes  Neurological:  Negative for dizziness and headaches.      Objective:    Physical Exam HENT:     Head: Normocephalic.     Mouth/Throat:     Mouth: Mucous membranes are moist.  Cardiovascular:     Rate and Rhythm: Normal rate.     Heart sounds: Normal heart sounds.  Pulmonary:  Effort: Pulmonary effort is normal.     Breath sounds: Normal breath sounds.  Neurological:     Mental Status: She is alert.     BP 135/85   Pulse (!) 104   Ht 5' 2 (1.575 m)   Wt 122 lb (55.3 kg)   SpO2 97%   BMI 22.31 kg/m  Wt Readings from Last 3 Encounters:  07/30/23 122 lb (55.3 kg)  05/27/23 120 lb 8 oz (54.7 kg)  05/15/23 116 lb 9.6 oz (52.9 kg)    Lab Results  Component Value Date   TSH 2.575 05/12/2023   Lab Results  Component Value Date   WBC 3.6 (L) 05/29/2023   HGB 8.3 (L) 05/29/2023   HCT 24.8 (L) 05/29/2023   MCV 100.0 05/29/2023   PLT 188 05/29/2023   Lab Results  Component Value Date   NA 129 (L) 05/29/2023   K 3.7 05/29/2023   CO2 26 05/29/2023   GLUCOSE 87 05/29/2023   BUN <5 (L) 05/29/2023   CREATININE 0.38 (L) 05/29/2023   BILITOT 0.9 05/29/2023   ALKPHOS 68 05/29/2023   AST 56 (H) 05/29/2023   ALT 19 05/29/2023   PROT 6.3 (L) 05/29/2023   ALBUMIN 2.5 (L) 05/29/2023   CALCIUM  8.3 (L) 05/29/2023   ANIONGAP 5 05/29/2023   EGFR 115 04/11/2023   Lab Results  Component Value Date   CHOL 177 04/03/2023   Lab Results  Component Value Date   HDL 88 04/03/2023   Lab Results  Component Value Date   LDLCALC 75 04/03/2023   Lab Results  Component Value Date   TRIG 73 04/03/2023   Lab Results  Component Value Date   CHOLHDL 2.0 04/03/2023   Lab Results  Component Value Date   HGBA1C 4.7 (L) 04/03/2023      Assessment & Plan:  Syncope, unspecified syncope type Assessment & Plan: A referral to cardiology has been made due to the  patient's history of SVT. Encouraged to stay well-hydrated and avoid standing up too quickly. Advised to use grab bars in the bathroom to help prevent falls. If feeling lightheaded or dizzy, lie down or sit immediately to avoid falling. Advised to avoid driving or operating heavy machinery until cleared by a healthcare provider.   Orders: -     Ambulatory referral to Cardiology  Depression, recurrent [facility]) Assessment & Plan: Encouraged to start Zoloft  25 mg daily for depression. A referral to behavioral health has been placed for talk therapy to support mental health. Advised to follow up if she notices any worsening of symptoms, including suicidal ideation (SI), homicidal ideation (HI), or auditory/visual hallucinations (AVH).  Orders: -     Sertraline  HCl; Take 1 tablet (25 mg total) by mouth daily.  Dispense: 60 tablet; Refill: 3 -     Amb ref to Integrated Behavioral Health  IFG (impaired fasting glucose) -     Hemoglobin A1c  Vitamin D  deficiency -     VITAMIN D  25 Hydroxy (Vit-D Deficiency, Fractures)  TSH (thyroid -stimulating hormone deficiency) -     TSH + free T4  Other hyperlipidemia -     Lipid panel -     CMP14+EGFR -     CBC with Differential/Platelet  Note: This chart has been completed using Engineer, civil (consulting) software, and while attempts have been made to ensure accuracy, certain words and phrases may not be transcribed as intended.    Follow-up: Return in about 4 months (around 11/30/2023).   Aubry Rankin,  FNP

## 2023-07-30 NOTE — Assessment & Plan Note (Signed)
 A referral to cardiology has been made due to the patient's history of SVT. Encouraged to stay well-hydrated and avoid standing up too quickly. Advised to use grab bars in the bathroom to help prevent falls. If feeling lightheaded or dizzy, lie down or sit immediately to avoid falling. Advised to avoid driving or operating heavy machinery until cleared by a healthcare provider.

## 2023-07-30 NOTE — Assessment & Plan Note (Signed)
 Encouraged to start Zoloft  25 mg daily for depression. A referral to behavioral health has been placed for talk therapy to support mental health. Advised to follow up if she notices any worsening of symptoms, including suicidal ideation (SI), homicidal ideation (HI), or auditory/visual hallucinations (AVH).

## 2023-07-30 NOTE — Patient Instructions (Addendum)
 I appreciate the opportunity to provide care to you today!    Follow up:  4 months  Labs: please stop by the lab today to get your blood drawn (CBC, CMP, TSH, Lipid profile, HgA1c, Vit D)  Syncope Education:- -Referral to cardiology has been made due to your history of SVT. -Stay well hydrated and avoid standing up too quickly. -Use grab bars in the bathroom to prevent falls. -If you feel lightheaded or dizzy, lie down or sit immediately to avoid falling. -Avoid driving or operating heavy machinery until cleared by your healthcare provider.  Depression -Start taking Zoloft  25 mg daily for depression. -A referral to behavioral health has been placed for talk therapy to support mental health.- Please follow up if you notice any worsening of symptoms, including suicidal ideation (SI), homicidal ideation (HI), or auditory/visual hallucinations (AVH).  Nonpharmacologic management of anxiety and depression  Mindfulness and Meditation Practices like mindfulness meditation can help reduce symptoms by promoting relaxation and present-moment awareness.  Exercise  Regular physical activity has been shown to improve mood and reduce anxiety through the release of endorphins and other neurochemicals.  Healthy Diet Eating a balanced diet rich in fruits, vegetables, whole grains, and lean proteins can support overall mental health.  Sleep Hygiene  Establishing a regular sleep routine and ensuring good sleep quality can significantly impact mood and anxiety levels.  Stress Management Techniques Activities such as yoga, tai chi, and deep breathing exercises can help manage stress.  Social Support Maintaining strong relationships and seeking support from friends, family, or support groups can provide emotional comfort and reduce feelings of isolation.  Lifestyle Modifications Reducing alcohol and caffeine intake, quitting smoking, and avoiding recreational drugs can improve symptoms.  Art and Music  Therapy Engaging in creative activities like painting, drawing, or playing music can be therapeutic and help express emotions.  Light Therapy Particularly useful for seasonal affective disorder (SAD), exposure to bright light can help regulate mood. .    Please follow up if your symptoms worsen or fail to improve.   Referrals today-  cardiology, behavioral health    Please continue to a heart-healthy diet and increase your physical activities. Try to exercise for at least five days a week.    It was a pleasure to see you and I look forward to continuing to work together on your health and well-being. Please do not hesitate to call the office if you need care or have questions about your care.  In case of emergency, please visit the Emergency Department for urgent care, or contact our clinic at [phone] to schedule an appointment. We're here to help you!   Have a wonderful day and week. With Gratitude, Keerthi Hazell MSN, FNP-BC

## 2023-07-31 LAB — TSH+FREE T4
Free T4: 0.89 ng/dL (ref 0.82–1.77)
TSH: 1.97 u[IU]/mL (ref 0.450–4.500)

## 2023-07-31 LAB — CMP14+EGFR
ALT: 35 IU/L — ABNORMAL HIGH (ref 0–32)
AST: 138 IU/L — ABNORMAL HIGH (ref 0–40)
Albumin: 3.7 g/dL — ABNORMAL LOW (ref 3.8–4.9)
Alkaline Phosphatase: 111 IU/L (ref 44–121)
BUN/Creatinine Ratio: 12 (ref 9–23)
BUN: 5 mg/dL — ABNORMAL LOW (ref 6–24)
Bilirubin Total: 1 mg/dL (ref 0.0–1.2)
CO2: 21 mmol/L (ref 20–29)
Calcium: 8.7 mg/dL (ref 8.7–10.2)
Chloride: 96 mmol/L (ref 96–106)
Creatinine, Ser: 0.41 mg/dL — ABNORMAL LOW (ref 0.57–1.00)
Globulin, Total: 4.4 g/dL (ref 1.5–4.5)
Glucose: 87 mg/dL (ref 70–99)
Potassium: 4.6 mmol/L (ref 3.5–5.2)
Sodium: 132 mmol/L — ABNORMAL LOW (ref 134–144)
Total Protein: 8.1 g/dL (ref 6.0–8.5)
eGFR: 118 mL/min/1.73 (ref >59)

## 2023-07-31 LAB — CBC WITH DIFFERENTIAL/PLATELET
Basophils Absolute: 0 x10E3/uL (ref 0.0–0.2)
Basos: 1 %
EOS (ABSOLUTE): 0.1 x10E3/uL (ref 0.0–0.4)
Eos: 2 %
Hematocrit: 29.1 % — ABNORMAL LOW (ref 34.0–46.6)
Hemoglobin: 9.4 g/dL — ABNORMAL LOW (ref 11.1–15.9)
Immature Grans (Abs): 0 x10E3/uL (ref 0.0–0.1)
Immature Granulocytes: 0 %
Lymphocytes Absolute: 1.5 x10E3/uL (ref 0.7–3.1)
Lymphs: 34 %
MCH: 28.7 pg (ref 26.6–33.0)
MCHC: 32.3 g/dL (ref 31.5–35.7)
MCV: 89 fL (ref 79–97)
Monocytes Absolute: 0.5 x10E3/uL (ref 0.1–0.9)
Monocytes: 13 %
Neutrophils Absolute: 2.1 x10E3/uL (ref 1.4–7.0)
Neutrophils: 50 %
Platelets: 130 x10E3/uL — ABNORMAL LOW (ref 150–450)
RBC: 3.28 x10E6/uL — ABNORMAL LOW (ref 3.77–5.28)
RDW: 15.8 % — ABNORMAL HIGH (ref 11.7–15.4)
WBC: 4.2 x10E3/uL (ref 3.4–10.8)

## 2023-07-31 LAB — LIPID PANEL
Chol/HDL Ratio: 2.1 ratio (ref 0.0–4.4)
Cholesterol, Total: 129 mg/dL (ref 100–199)
HDL: 61 mg/dL (ref >39)
LDL Chol Calc (NIH): 55 mg/dL (ref 0–99)
Triglycerides: 61 mg/dL (ref 0–149)
VLDL Cholesterol Cal: 13 mg/dL (ref 5–40)

## 2023-07-31 LAB — HEMOGLOBIN A1C
Est. average glucose Bld gHb Est-mCnc: 82 mg/dL
Hgb A1c MFr Bld: 4.5 % — ABNORMAL LOW (ref 4.8–5.6)

## 2023-07-31 LAB — VITAMIN D 25 HYDROXY (VIT D DEFICIENCY, FRACTURES): Vit D, 25-Hydroxy: 19.3 ng/mL — ABNORMAL LOW (ref 30.0–100.0)

## 2023-08-01 ENCOUNTER — Telehealth: Payer: Self-pay

## 2023-08-01 NOTE — Telephone Encounter (Signed)
 Copied from CRM [id number]. Topic: Clinical - Lab/Test Results >> Aug 01, 2023 10:53 AM Ivette P wrote: Reason for CRM: Pt called in about lab results from 07/30/2023. Advised still not have been seen or read. Pt would like to know results as soon as possible when available. And would like to know if needs to go back to hospital.

## 2023-08-04 ENCOUNTER — Other Ambulatory Visit: Payer: Self-pay

## 2023-08-04 ENCOUNTER — Emergency Department (HOSPITAL_COMMUNITY)

## 2023-08-04 ENCOUNTER — Ambulatory Visit: Payer: Self-pay | Admitting: Family Medicine

## 2023-08-04 ENCOUNTER — Encounter (HOSPITAL_COMMUNITY): Payer: Self-pay

## 2023-08-04 ENCOUNTER — Inpatient Hospital Stay (HOSPITAL_COMMUNITY)
Admission: EM | Admit: 2023-08-04 | Discharge: 2023-08-14 | DRG: 327 | Disposition: A | Discharge status: Home / Self Care | Attending: Family Medicine | Admitting: Family Medicine

## 2023-08-04 DIAGNOSIS — I10 Essential (primary) hypertension: Secondary | ICD-10-CM | POA: Diagnosis present

## 2023-08-04 DIAGNOSIS — Z6824 Body mass index (BMI) 24.0-24.9, adult: Secondary | ICD-10-CM

## 2023-08-04 DIAGNOSIS — R1084 Generalized abdominal pain: Secondary | ICD-10-CM | POA: Diagnosis not present

## 2023-08-04 DIAGNOSIS — Z791 Long term (current) use of non-steroidal anti-inflammatories (NSAID): Secondary | ICD-10-CM

## 2023-08-04 DIAGNOSIS — K668 Other specified disorders of peritoneum: Secondary | ICD-10-CM

## 2023-08-04 DIAGNOSIS — R0600 Dyspnea, unspecified: Secondary | ICD-10-CM | POA: Diagnosis not present

## 2023-08-04 DIAGNOSIS — K275 Chronic or unspecified peptic ulcer, site unspecified, with perforation: Principal | ICD-10-CM | POA: Diagnosis present

## 2023-08-04 DIAGNOSIS — K769 Liver disease, unspecified: Secondary | ICD-10-CM

## 2023-08-04 DIAGNOSIS — E440 Moderate protein-calorie malnutrition: Secondary | ICD-10-CM | POA: Diagnosis present

## 2023-08-04 DIAGNOSIS — K7581 Nonalcoholic steatohepatitis (NASH): Secondary | ICD-10-CM | POA: Diagnosis present

## 2023-08-04 DIAGNOSIS — Z91040 Latex allergy status: Secondary | ICD-10-CM

## 2023-08-04 DIAGNOSIS — Z807 Family history of other malignant neoplasms of lymphoid, hematopoietic and related tissues: Secondary | ICD-10-CM

## 2023-08-04 DIAGNOSIS — Z91030 Bee allergy status: Secondary | ICD-10-CM

## 2023-08-04 DIAGNOSIS — E559 Vitamin D deficiency, unspecified: Secondary | ICD-10-CM

## 2023-08-04 DIAGNOSIS — F17210 Nicotine dependence, cigarettes, uncomplicated: Secondary | ICD-10-CM | POA: Diagnosis present

## 2023-08-04 DIAGNOSIS — E222 Syndrome of inappropriate secretion of antidiuretic hormone: Secondary | ICD-10-CM | POA: Diagnosis present

## 2023-08-04 DIAGNOSIS — K3189 Other diseases of stomach and duodenum: Secondary | ICD-10-CM | POA: Diagnosis present

## 2023-08-04 DIAGNOSIS — G479 Sleep disorder, unspecified: Secondary | ICD-10-CM | POA: Diagnosis present

## 2023-08-04 DIAGNOSIS — K251 Acute gastric ulcer with perforation: Secondary | ICD-10-CM | POA: Diagnosis not present

## 2023-08-04 DIAGNOSIS — F1010 Alcohol abuse, uncomplicated: Secondary | ICD-10-CM | POA: Diagnosis present

## 2023-08-04 DIAGNOSIS — I959 Hypotension, unspecified: Secondary | ICD-10-CM | POA: Diagnosis not present

## 2023-08-04 DIAGNOSIS — Z806 Family history of leukemia: Secondary | ICD-10-CM

## 2023-08-04 DIAGNOSIS — D62 Acute posthemorrhagic anemia: Secondary | ICD-10-CM

## 2023-08-04 DIAGNOSIS — Z79899 Other long term (current) drug therapy: Secondary | ICD-10-CM

## 2023-08-04 DIAGNOSIS — K7030 Alcoholic cirrhosis of liver without ascites: Secondary | ICD-10-CM | POA: Diagnosis present

## 2023-08-04 DIAGNOSIS — D509 Iron deficiency anemia, unspecified: Secondary | ICD-10-CM | POA: Diagnosis present

## 2023-08-04 DIAGNOSIS — F05 Delirium due to known physiological condition: Secondary | ICD-10-CM | POA: Diagnosis not present

## 2023-08-04 DIAGNOSIS — Z8719 Personal history of other diseases of the digestive system: Secondary | ICD-10-CM

## 2023-08-04 DIAGNOSIS — E876 Hypokalemia: Secondary | ICD-10-CM | POA: Diagnosis present

## 2023-08-04 DIAGNOSIS — K256 Chronic or unspecified gastric ulcer with both hemorrhage and perforation: Principal | ICD-10-CM | POA: Diagnosis present

## 2023-08-04 DIAGNOSIS — K420 Umbilical hernia with obstruction, without gangrene: Secondary | ICD-10-CM | POA: Diagnosis present

## 2023-08-04 DIAGNOSIS — K7031 Alcoholic cirrhosis of liver with ascites: Secondary | ICD-10-CM | POA: Diagnosis present

## 2023-08-04 DIAGNOSIS — R198 Other specified symptoms and signs involving the digestive system and abdomen: Secondary | ICD-10-CM | POA: Diagnosis present

## 2023-08-04 DIAGNOSIS — K631 Perforation of intestine (nontraumatic): Secondary | ICD-10-CM | POA: Diagnosis not present

## 2023-08-04 LAB — CBC
HCT: 31.2 % — ABNORMAL LOW (ref 36.0–46.0)
Hemoglobin: 10.4 g/dL — ABNORMAL LOW (ref 12.0–15.0)
MCH: 30 pg (ref 26.0–34.0)
MCHC: 33.3 g/dL (ref 30.0–36.0)
MCV: 89.9 fL (ref 80.0–100.0)
Platelets: 123 K/uL — ABNORMAL LOW (ref 150–400)
RBC: 3.47 MIL/uL — ABNORMAL LOW (ref 3.87–5.11)
RDW: 18.3 % — ABNORMAL HIGH (ref 11.5–15.5)
WBC: 5.5 K/uL (ref 4.0–10.5)
nRBC: 0 % (ref 0.0–0.2)

## 2023-08-04 LAB — COMPREHENSIVE METABOLIC PANEL WITH GFR
ALT: 33 U/L (ref 0–44)
AST: 116 U/L — ABNORMAL HIGH (ref 15–41)
Albumin: 3.1 g/dL — ABNORMAL LOW (ref 3.5–5.0)
Alkaline Phosphatase: 86 U/L (ref 38–126)
Anion gap: 15 (ref 5–15)
BUN: 7 mg/dL (ref 6–20)
CO2: 19 mmol/L — ABNORMAL LOW (ref 22–32)
Calcium: 7.9 mg/dL — ABNORMAL LOW (ref 8.9–10.3)
Chloride: 88 mmol/L — ABNORMAL LOW (ref 98–111)
Creatinine, Ser: 0.57 mg/dL (ref 0.44–1.00)
GFR, Estimated: >60 mL/min (ref >60)
Glucose, Bld: 126 mg/dL — ABNORMAL HIGH (ref 70–99)
Potassium: 3.3 mmol/L — ABNORMAL LOW (ref 3.5–5.1)
Sodium: 122 mmol/L — ABNORMAL LOW (ref 135–145)
Total Bilirubin: 1.7 mg/dL — ABNORMAL HIGH (ref 0.0–1.2)
Total Protein: 7.7 g/dL (ref 6.5–8.1)

## 2023-08-04 LAB — LIPASE, BLOOD: Lipase: 68 U/L — ABNORMAL HIGH (ref 11–51)

## 2023-08-04 LAB — PROTIME-INR
INR: 1.3 — ABNORMAL HIGH (ref 0.8–1.2)
Prothrombin Time: 16.6 s — ABNORMAL HIGH (ref 11.4–15.2)

## 2023-08-04 LAB — ETHANOL: Alcohol, Ethyl (B): 189 mg/dL — ABNORMAL HIGH (ref <15)

## 2023-08-04 LAB — APTT: aPTT: 33 s (ref 24–36)

## 2023-08-04 MED ORDER — FENTANYL CITRATE (PF) 100 MCG/2ML IJ SOLN
50.0000 ug | Freq: Once | INTRAMUSCULAR | Status: AC
  Administered 2023-08-04: 50 ug via INTRAVENOUS
  Filled 2023-08-04: qty 2

## 2023-08-04 MED ORDER — SODIUM CHLORIDE 0.9 % IV BOLUS
1000.0000 mL | Freq: Once | INTRAVENOUS | Status: AC
  Administered 2023-08-04: 1000 mL via INTRAVENOUS

## 2023-08-04 MED ORDER — ONDANSETRON HCL 4 MG/2ML IJ SOLN
4.0000 mg | Freq: Once | INTRAMUSCULAR | Status: AC
  Administered 2023-08-04: 4 mg via INTRAVENOUS
  Filled 2023-08-04: qty 2

## 2023-08-04 MED ORDER — VITAMIN D (ERGOCALCIFEROL) 1.25 MG (50000 UNIT) PO CAPS: 50000.0000 [IU] | ORAL_CAPSULE | ORAL | 1 refills | Status: DC

## 2023-08-04 MED ORDER — PANTOPRAZOLE SODIUM 40 MG IV SOLR
40.0000 mg | Freq: Once | INTRAVENOUS | Status: AC
  Administered 2023-08-04: 40 mg via INTRAVENOUS
  Filled 2023-08-04: qty 10

## 2023-08-04 NOTE — ED Triage Notes (Signed)
 Pt complaining of upper abdominal pain for the last 2 days. Has been nauseated as well. Said she has had diarrhea that is dark in color. Hx of chirrosis and said her abdomen is making it hard to breath

## 2023-08-04 NOTE — ED Provider Notes (Signed)
 [place] EMERGENCY DEPARTMENT AT [facility] Provider Note   CSN: [id number] Arrival date & time: 08/04/23  2302     Patient presents with: Abdominal Pain   Joan Wood is a 53 y.o. female.  {Add pertinent medical, surgical, social history, OB history to YEP:67052} The history is provided by the patient.  Patient with history of hypertension, alcohol use disorder, cirrhosis presents with abdominal pain vomiting and diarrhea.  Patient reports yesterday she started having upper abdominal pain with associated nonbloody emesis, she now reports she is having dark and bloody diarrhea.  She reports similar episodes in the past.  She admits to alcohol use tonight.  She also reports the pain is so severe she cannot breathe.  No chest pain    Past Medical History:  Diagnosis Date   Anxiety    GERD (gastroesophageal reflux disease)    Hernia of abdominal wall    HTN (hypertension)    SVT (supraventricular tachycardia) (HCC)     Prior to Admission medications   Medication Sig Start Date End Date Taking? Authorizing Provider  acetaminophen  (TYLENOL ) 500 MG tablet Take 2 tablets (1,000 mg total) by mouth every 6 (six) hours as needed for moderate pain (pain score 4-6). 05/29/23   Evonnie Lenis, MD  bismuth subsalicylate (PEPTO BISMOL) 262 MG/15ML suspension Take 30 mLs by mouth every 6 (six) hours as needed for diarrhea or loose stools.    [provider]  Elastic Bandages & Supports (FITRITE BACK BRACE WITH PULLEY) MISC 1 Units by Does not apply route daily. 05/25/23   Bevely Doffing, FNP  folic acid  (FOLVITE ) 1 MG tablet Take 1 tablet (1 mg total) by mouth daily. 03/29/23   Zarwolo, Gloria, FNP  gabapentin  (NEURONTIN ) 100 MG capsule Take 100 mg by mouth daily as needed (nerve pain).    [provider]  Misc. Devices MISC Closed toe walking boot for right foot toe fractures Dx: D07.698 05/25/23   Bevely Doffing, FNP  omeprazole  (PRILOSEC) 40 MG capsule Take 1 capsule  (40 mg total) by mouth 2 (two) times daily. 05/29/23   Evonnie Lenis, MD  ondansetron  (ZOFRAN ) 4 MG tablet Take 1 tablet (4 mg total) by mouth 2 (two) times daily as needed for nausea or vomiting. 04/23/23   Bevely Doffing, FNP  pyridOXINE (VITAMIN B6) 50 MG tablet Take 1 tablet (50 mg total) by mouth daily. 03/29/23   Zarwolo, Gloria, FNP  sertraline  (ZOLOFT ) 25 MG tablet Take 1 tablet (25 mg total) by mouth daily. 07/30/23   Zarwolo, Gloria, FNP  sucralfate  (CARAFATE ) 1 g tablet Take 1 tablet (1 g total) by mouth 4 (four) times daily -  with meals and at bedtime. 05/29/23   Evonnie Lenis, MD  Vitamin D , Ergocalciferol , (DRISDOL ) 1.25 MG (50000 UNIT) CAPS capsule Take 1 capsule (50,000 Units total) by mouth every 7 (seven) days. 08/04/23   Zarwolo, Gloria, FNP    Allergies: Bee venom and Latex    Review of Systems  Constitutional:  Negative for fever.  Respiratory:  Positive for shortness of breath.   Cardiovascular:  Negative for chest pain.  Gastrointestinal:  Positive for blood in stool, diarrhea, nausea and vomiting.  Psychiatric/Behavioral:  The patient is nervous/anxious.     Updated Vital Signs BP 91/67 (BP Location: Right Arm)   Pulse 96   Temp 98.6 F (37 C) (Oral)   Resp 18   Ht 1.575 m (5' 2)   Wt 54.4 kg   SpO2 100%   BMI  21.95 kg/m   Physical Exam CONSTITUTIONAL: Ill-appearing, appears older than stated age HEAD: Normocephalic/atraumatic EYES: EOMI/PERRL ENMT: Mucous membranes moist NECK: supple no meningeal signs CV: S1/S2 noted, no murmurs/rubs/gallops noted LUNGS: Lungs are clear to auscultation bilaterally, no apparent distress ABDOMEN: soft, distended diffuse upper abdominal tenderness, easily reproducible abdominal wall hernia Rectal-external hemorrhoids are noted, no melena, red-brown stool noted Hemoccult positive, nurse present for exam NEURO: Pt is awake/alert/appropriate, moves all extremitiesx4.  No facial droop.   EXTREMITIES: pulses normal/equal, full  ROM SKIN: warm, color normal PSYCH: anxious and hyperventilating (all labs ordered are listed, but only abnormal results are displayed) Labs Reviewed  LIPASE, BLOOD  COMPREHENSIVE METABOLIC PANEL WITH GFR  CBC  URINALYSIS, ROUTINE W REFLEX MICROSCOPIC  PROTIME-INR  APTT  ETHANOL  POC OCCULT BLOOD, ED  TYPE AND SCREEN    EKG: None  Radiology: No results found.  {Document cardiac monitor, telemetry assessment procedure when appropriate:32947} .Critical Care  Performed by: Midge Golas, MD Authorized by: Midge Golas, MD   Critical care provider statement:    Critical care start time:  08/04/2023 11:31 PM   Critical care end time:  08/04/2023 11:31 PM   Critical care time was exclusive of:  Separately billable procedures and treating other patients   Critical care was necessary to treat or prevent imminent or life-threatening deterioration of the following conditions:  Shock and hepatic failure   Critical care was time spent personally by me on the following activities:  Examination of patient, pulse oximetry, ordering and review of laboratory studies, ordering and review of radiographic studies, re-evaluation of patient's condition, ordering and performing treatments and interventions, review of old charts, evaluation of patient's response to treatment and development of treatment plan with patient or surrogate   I assumed direction of critical care for this patient from another provider in my specialty: no      Medications Ordered in the ED  fentaNYL  (SUBLIMAZE ) injection 50 mcg (has no administration in time range)  ondansetron  (ZOFRAN ) injection 4 mg (has no administration in time range)  pantoprazole  (PROTONIX ) injection 40 mg (has no administration in time range)  sodium chloride  0.9 % bolus 1,000 mL (has no administration in time range)      {Click here for ABCD2, HEART and other calculators REFRESH Note before signing:1}                              Medical  Decision Making Amount and/or Complexity of Data Reviewed Labs: ordered. Radiology: ordered. ECG/medicine tests: ordered.  Risk Prescription drug management.   This patient presents to the ED for concern of abdominal pain, this involves an extensive number of treatment options, and is a complaint that carries with it a high risk of complications and morbidity.  The differential diagnosis includes but is not limited to cholecystitis, cholelithiasis, pancreatitis, gastritis, peptic ulcer disease, appendicitis, bowel obstruction, bowel perforation, diverticulitis, AAA, ischemic bowel    Comorbidities that complicate the patient evaluation: Patient's presentation is complicated by their history of hypertension, liver disease  Social Determinants of Health: Patient's alcohol use  increases the complexity of managing their presentation  Additional history obtained: Records reviewed previous admission documents  Lab Tests: I Ordered, and personally interpreted labs.  The pertinent results include:  ***  Imaging Studies ordered: I ordered imaging studies including X-ray chest  I independently visualized and interpreted imaging which showed *** I agree with the radiologist interpretation  Cardiac Monitoring: The  patient was maintained on a cardiac monitor.  I personally viewed and interpreted the cardiac monitor which showed an underlying rhythm of:  {cardiac monitor:26849}  Medicines ordered and prescription drug management: I ordered medication including fentanyl  for analgesia Reevaluation of the patient after these medicines showed that the patient    {resolved/improved/worsened:23923::improved}  Test Considered: Patient is low risk / negative by ***, therefore do not feel that *** is indicated.  Critical Interventions:  ***  Consultations Obtained: I requested consultation with the {consultation:26851}, and discussed  findings as well as pertinent plan - they recommend:  ***  Reevaluation: After the interventions noted above, I reevaluated the patient and found that they have :{resolved/improved/worsened:23923::improved}  Complexity of problems addressed: Patient's presentation is most consistent with  acute presentation with potential threat to life or bodily function  Disposition: After consideration of the diagnostic results and the patient's response to treatment,  I feel that the patent would benefit from {disposition:26850}.     {Document critical care time when appropriate  Document review of labs and clinical decision tools ie CHADS2VASC2, etc  Document your independent review of radiology images and any outside records  Document your discussion with family members, caretakers and with consultants  Document social determinants of health affecting pt's care  Document your decision making why or why not admission, treatments were needed:32947:::1}   Final diagnoses:  None    ED Discharge Orders     None

## 2023-08-05 ENCOUNTER — Emergency Department (HOSPITAL_COMMUNITY)

## 2023-08-05 ENCOUNTER — Inpatient Hospital Stay (HOSPITAL_COMMUNITY)

## 2023-08-05 ENCOUNTER — Other Ambulatory Visit: Payer: Self-pay

## 2023-08-05 ENCOUNTER — Emergency Department (HOSPITAL_COMMUNITY): Admitting: Anesthesiology

## 2023-08-05 ENCOUNTER — Encounter (HOSPITAL_COMMUNITY)
Admission: EM | Disposition: A | Discharge status: Home / Self Care | Payer: Self-pay | Source: Home / Self Care | Attending: Family Medicine

## 2023-08-05 DIAGNOSIS — E871 Hypo-osmolality and hyponatremia: Secondary | ICD-10-CM | POA: Diagnosis not present

## 2023-08-05 DIAGNOSIS — K251 Acute gastric ulcer with perforation: Secondary | ICD-10-CM | POA: Diagnosis not present

## 2023-08-05 DIAGNOSIS — D509 Iron deficiency anemia, unspecified: Secondary | ICD-10-CM | POA: Diagnosis not present

## 2023-08-05 DIAGNOSIS — K275 Chronic or unspecified peptic ulcer, site unspecified, with perforation: Secondary | ICD-10-CM

## 2023-08-05 DIAGNOSIS — Z806 Family history of leukemia: Secondary | ICD-10-CM | POA: Diagnosis not present

## 2023-08-05 DIAGNOSIS — E440 Moderate protein-calorie malnutrition: Secondary | ICD-10-CM | POA: Diagnosis not present

## 2023-08-05 DIAGNOSIS — I959 Hypotension, unspecified: Secondary | ICD-10-CM | POA: Diagnosis not present

## 2023-08-05 DIAGNOSIS — K669 Disorder of peritoneum, unspecified: Secondary | ICD-10-CM | POA: Diagnosis not present

## 2023-08-05 DIAGNOSIS — F17210 Nicotine dependence, cigarettes, uncomplicated: Secondary | ICD-10-CM | POA: Diagnosis not present

## 2023-08-05 DIAGNOSIS — K769 Liver disease, unspecified: Secondary | ICD-10-CM | POA: Diagnosis not present

## 2023-08-05 DIAGNOSIS — Z79899 Other long term (current) drug therapy: Secondary | ICD-10-CM | POA: Diagnosis not present

## 2023-08-05 DIAGNOSIS — R198 Other specified symptoms and signs involving the digestive system and abdomen: Secondary | ICD-10-CM | POA: Diagnosis present

## 2023-08-05 DIAGNOSIS — K256 Chronic or unspecified gastric ulcer with both hemorrhage and perforation: Secondary | ICD-10-CM | POA: Diagnosis not present

## 2023-08-05 DIAGNOSIS — Z8711 Personal history of peptic ulcer disease: Secondary | ICD-10-CM | POA: Diagnosis not present

## 2023-08-05 DIAGNOSIS — K7030 Alcoholic cirrhosis of liver without ascites: Secondary | ICD-10-CM | POA: Diagnosis not present

## 2023-08-05 DIAGNOSIS — Z4682 Encounter for fitting and adjustment of non-vascular catheter: Secondary | ICD-10-CM | POA: Diagnosis not present

## 2023-08-05 DIAGNOSIS — E222 Syndrome of inappropriate secretion of antidiuretic hormone: Secondary | ICD-10-CM | POA: Diagnosis not present

## 2023-08-05 DIAGNOSIS — F1010 Alcohol abuse, uncomplicated: Secondary | ICD-10-CM | POA: Diagnosis present

## 2023-08-05 DIAGNOSIS — D649 Anemia, unspecified: Secondary | ICD-10-CM | POA: Diagnosis not present

## 2023-08-05 DIAGNOSIS — K420 Umbilical hernia with obstruction, without gangrene: Secondary | ICD-10-CM | POA: Diagnosis not present

## 2023-08-05 DIAGNOSIS — Z807 Family history of other malignant neoplasms of lymphoid, hematopoietic and related tissues: Secondary | ICD-10-CM | POA: Diagnosis not present

## 2023-08-05 DIAGNOSIS — K255 Chronic or unspecified gastric ulcer with perforation: Secondary | ICD-10-CM | POA: Diagnosis not present

## 2023-08-05 DIAGNOSIS — K828 Other specified diseases of gallbladder: Secondary | ICD-10-CM | POA: Diagnosis not present

## 2023-08-05 DIAGNOSIS — K565 Intestinal adhesions [bands], unspecified as to partial versus complete obstruction: Secondary | ICD-10-CM | POA: Diagnosis not present

## 2023-08-05 DIAGNOSIS — Z791 Long term (current) use of non-steroidal anti-inflammatories (NSAID): Secondary | ICD-10-CM | POA: Diagnosis not present

## 2023-08-05 DIAGNOSIS — K429 Umbilical hernia without obstruction or gangrene: Secondary | ICD-10-CM | POA: Diagnosis not present

## 2023-08-05 DIAGNOSIS — D62 Acute posthemorrhagic anemia: Secondary | ICD-10-CM | POA: Diagnosis not present

## 2023-08-05 DIAGNOSIS — Z8719 Personal history of other diseases of the digestive system: Secondary | ICD-10-CM | POA: Diagnosis not present

## 2023-08-05 DIAGNOSIS — F1090 Alcohol use, unspecified, uncomplicated: Secondary | ICD-10-CM | POA: Diagnosis not present

## 2023-08-05 DIAGNOSIS — T39395A Adverse effect of other nonsteroidal anti-inflammatory drugs [NSAID], initial encounter: Secondary | ICD-10-CM | POA: Diagnosis not present

## 2023-08-05 DIAGNOSIS — K7460 Unspecified cirrhosis of liver: Secondary | ICD-10-CM | POA: Diagnosis not present

## 2023-08-05 DIAGNOSIS — Z91030 Bee allergy status: Secondary | ICD-10-CM | POA: Diagnosis not present

## 2023-08-05 DIAGNOSIS — Z6824 Body mass index (BMI) 24.0-24.9, adult: Secondary | ICD-10-CM | POA: Diagnosis not present

## 2023-08-05 DIAGNOSIS — R109 Unspecified abdominal pain: Secondary | ICD-10-CM | POA: Diagnosis not present

## 2023-08-05 DIAGNOSIS — K7581 Nonalcoholic steatohepatitis (NASH): Secondary | ICD-10-CM | POA: Diagnosis not present

## 2023-08-05 DIAGNOSIS — K631 Perforation of intestine (nontraumatic): Secondary | ICD-10-CM | POA: Diagnosis not present

## 2023-08-05 DIAGNOSIS — I10 Essential (primary) hypertension: Secondary | ICD-10-CM | POA: Diagnosis not present

## 2023-08-05 DIAGNOSIS — R140 Abdominal distension (gaseous): Secondary | ICD-10-CM | POA: Diagnosis not present

## 2023-08-05 DIAGNOSIS — E876 Hypokalemia: Secondary | ICD-10-CM | POA: Diagnosis not present

## 2023-08-05 DIAGNOSIS — K7031 Alcoholic cirrhosis of liver with ascites: Secondary | ICD-10-CM | POA: Diagnosis present

## 2023-08-05 DIAGNOSIS — K3189 Other diseases of stomach and duodenum: Secondary | ICD-10-CM | POA: Diagnosis not present

## 2023-08-05 DIAGNOSIS — K2960 Other gastritis without bleeding: Secondary | ICD-10-CM | POA: Diagnosis not present

## 2023-08-05 DIAGNOSIS — R0600 Dyspnea, unspecified: Secondary | ICD-10-CM | POA: Diagnosis not present

## 2023-08-05 DIAGNOSIS — K668 Other specified disorders of peritoneum: Secondary | ICD-10-CM | POA: Diagnosis not present

## 2023-08-05 DIAGNOSIS — Z91040 Latex allergy status: Secondary | ICD-10-CM | POA: Diagnosis not present

## 2023-08-05 DIAGNOSIS — F05 Delirium due to known physiological condition: Secondary | ICD-10-CM | POA: Diagnosis not present

## 2023-08-05 HISTORY — PX: UMBILICAL HERNIA REPAIR: SHX196

## 2023-08-05 HISTORY — PX: LAPAROTOMY: SHX154

## 2023-08-05 HISTORY — PX: GASTRORRHAPHY: SHX6263

## 2023-08-05 HISTORY — PX: LIVER BIOPSY: SHX301

## 2023-08-05 LAB — BASIC METABOLIC PANEL WITH GFR
Anion gap: 10 (ref 5–15)
Anion gap: 11 (ref 5–15)
Anion gap: 9 (ref 5–15)
BUN: 8 mg/dL (ref 6–20)
BUN: 9 mg/dL (ref 6–20)
BUN: 9 mg/dL (ref 6–20)
CO2: 18 mmol/L — ABNORMAL LOW (ref 22–32)
CO2: 18 mmol/L — ABNORMAL LOW (ref 22–32)
CO2: 18 mmol/L — ABNORMAL LOW (ref 22–32)
Calcium: 7.2 mg/dL — ABNORMAL LOW (ref 8.9–10.3)
Calcium: 7.5 mg/dL — ABNORMAL LOW (ref 8.9–10.3)
Calcium: 7.7 mg/dL — ABNORMAL LOW (ref 8.9–10.3)
Chloride: 98 mmol/L (ref 98–111)
Chloride: 97 mmol/L — ABNORMAL LOW (ref 98–111)
Chloride: 100 mmol/L (ref 98–111)
Creatinine, Ser: 0.6 mg/dL (ref 0.44–1.00)
Creatinine, Ser: 0.67 mg/dL (ref 0.44–1.00)
Creatinine, Ser: 0.55 mg/dL (ref 0.44–1.00)
GFR, Estimated: >60 mL/min (ref >60)
GFR, Estimated: >60 mL/min (ref >60)
GFR, Estimated: >60 mL/min (ref >60)
Glucose, Bld: 92 mg/dL (ref 70–99)
Glucose, Bld: 139 mg/dL — ABNORMAL HIGH (ref 70–99)
Glucose, Bld: 132 mg/dL — ABNORMAL HIGH (ref 70–99)
Potassium: 4 mmol/L (ref 3.5–5.1)
Potassium: 3.9 mmol/L (ref 3.5–5.1)
Potassium: 4.3 mmol/L (ref 3.5–5.1)
Sodium: 126 mmol/L — ABNORMAL LOW (ref 135–145)
Sodium: 126 mmol/L — ABNORMAL LOW (ref 135–145)
Sodium: 127 mmol/L — ABNORMAL LOW (ref 135–145)

## 2023-08-05 LAB — URINALYSIS, ROUTINE W REFLEX MICROSCOPIC
Bilirubin Urine: NEGATIVE
Glucose, UA: NEGATIVE mg/dL
Ketones, ur: NEGATIVE mg/dL
Nitrite: NEGATIVE
Protein, ur: NEGATIVE mg/dL
Specific Gravity, Urine: 1.017 (ref 1.005–1.030)
pH: 6 (ref 5.0–8.0)

## 2023-08-05 LAB — NA AND K (SODIUM & POTASSIUM), RAND UR
Potassium Urine: 17 mmol/L
Sodium, Ur: 15 mmol/L

## 2023-08-05 LAB — ABO/RH: ABO/RH(D): A POS

## 2023-08-05 LAB — PREPARE RBC (CROSSMATCH)

## 2023-08-05 LAB — MRSA NEXT GEN BY PCR, NASAL: MRSA by PCR Next Gen: NOT DETECTED

## 2023-08-05 LAB — OSMOLALITY: Osmolality: 307 mosm/kg — ABNORMAL HIGH (ref 275–295)

## 2023-08-05 LAB — OSMOLALITY, URINE: Osmolality, Ur: 225 mosm/kg — ABNORMAL LOW (ref 300–900)

## 2023-08-05 SURGERY — GASTRORRHAPHY
Anesthesia: General

## 2023-08-05 MED ORDER — PANTOPRAZOLE SODIUM 40 MG IV SOLR
40.0000 mg | Freq: Two times a day (BID) | INTRAVENOUS | Status: DC
  Administered 2023-08-05 – 2023-08-11 (×14): 40 mg via INTRAVENOUS
  Filled 2023-08-05 (×14): qty 10

## 2023-08-05 MED ORDER — PROPOFOL 10 MG/ML IV BOLUS
INTRAVENOUS | Status: DC | PRN
  Administered 2023-08-05: 100 mg via INTRAVENOUS

## 2023-08-05 MED ORDER — SUGAMMADEX SODIUM 200 MG/2ML IV SOLN
INTRAVENOUS | Status: AC
  Filled 2023-08-05: qty 2

## 2023-08-05 MED ORDER — SODIUM CHLORIDE 0.9 % IV BOLUS: 1000.0000 mL | Freq: Once | INTRAVENOUS | Status: DC

## 2023-08-05 MED ORDER — LACTATED RINGERS IV BOLUS
500.0000 mL | Freq: Once | INTRAVENOUS | Status: AC
  Administered 2023-08-05: 500 mL via INTRAVENOUS

## 2023-08-05 MED ORDER — ONDANSETRON HCL 4 MG/2ML IJ SOLN
4.0000 mg | Freq: Once | INTRAMUSCULAR | Status: AC
  Administered 2023-08-05: 4 mg via INTRAVENOUS

## 2023-08-05 MED ORDER — FENTANYL CITRATE (PF) 100 MCG/2ML IJ SOLN
INTRAMUSCULAR | Status: DC | PRN
  Administered 2023-08-05: 50 ug via INTRAVENOUS
  Administered 2023-08-05 (×2): 100 ug via INTRAVENOUS

## 2023-08-05 MED ORDER — KCL-LACTATED RINGERS-D5W 20 MEQ/L IV SOLN
INTRAVENOUS | Status: AC
  Filled 2023-08-05 (×3): qty 1000

## 2023-08-05 MED ORDER — SODIUM CHLORIDE 0.9 % IV BOLUS
1000.0000 mL | Freq: Once | INTRAVENOUS | Status: AC
  Administered 2023-08-05: 1000 mL via INTRAVENOUS

## 2023-08-05 MED ORDER — SODIUM CHLORIDE 0.9 % IR SOLN
Status: DC | PRN
  Administered 2023-08-05: 1000 mL

## 2023-08-05 MED ORDER — SUCRALFATE 1 G PO TABS
1.0000 g | ORAL_TABLET | Freq: Three times a day (TID) | ORAL | Status: DC
  Administered 2023-08-06 – 2023-08-14 (×32): 1 g via ORAL
  Filled 2023-08-05 (×33): qty 1

## 2023-08-05 MED ORDER — MORPHINE SULFATE (PF) 2 MG/ML IV SOLN
2.0000 mg | INTRAVENOUS | Status: DC | PRN
  Administered 2023-08-05: 4 mg via INTRAVENOUS
  Administered 2023-08-05: 2 mg via INTRAVENOUS
  Administered 2023-08-05 (×4): 4 mg via INTRAVENOUS
  Administered 2023-08-06 – 2023-08-07 (×9): 2 mg via INTRAVENOUS
  Filled 2023-08-05 (×2): qty 2
  Filled 2023-08-05 (×6): qty 1
  Filled 2023-08-05: qty 2
  Filled 2023-08-05: qty 1
  Filled 2023-08-05 (×2): qty 2
  Filled 2023-08-05: qty 1
  Filled 2023-08-05: qty 2
  Filled 2023-08-05: qty 1

## 2023-08-05 MED ORDER — IOHEXOL 300 MG/ML  SOLN
100.0000 mL | Freq: Once | INTRAMUSCULAR | Status: AC | PRN
  Administered 2023-08-05: 100 mL via INTRAVENOUS

## 2023-08-05 MED ORDER — BUPIVACAINE HCL (PF) 0.5 % IJ SOLN
INTRAMUSCULAR | Status: AC
  Filled 2023-08-05: qty 30

## 2023-08-05 MED ORDER — HEMOSTATIC AGENTS (NO CHARGE) OPTIME
TOPICAL | Status: DC | PRN
Start: 2023-08-05 — End: 2023-08-05
  Administered 2023-08-05: 1 via TOPICAL

## 2023-08-05 MED ORDER — KCL-LACTATED RINGERS-D5W 20 MEQ/L IV SOLN: INTRAVENOUS | Status: DC

## 2023-08-05 MED ORDER — BUPIVACAINE HCL (PF) 0.5 % IJ SOLN
INTRAMUSCULAR | Status: DC | PRN
  Administered 2023-08-05: 30 mL

## 2023-08-05 MED ORDER — METHOCARBAMOL 1000 MG/10ML IJ SOLN
500.0000 mg | Freq: Three times a day (TID) | INTRAMUSCULAR | Status: DC | PRN
  Administered 2023-08-05 – 2023-08-13 (×5): 500 mg via INTRAVENOUS
  Filled 2023-08-05 (×5): qty 10

## 2023-08-05 MED ORDER — LORAZEPAM 2 MG/ML PO CONC: 1.0000 mg | ORAL | Status: DC | PRN

## 2023-08-05 MED ORDER — PHENOL 1.4 % MT LIQD
1.0000 | OROMUCOSAL | Status: DC | PRN
  Administered 2023-08-05 – 2023-08-08 (×4): 1 via OROMUCOSAL
  Filled 2023-08-05: qty 177

## 2023-08-05 MED ORDER — ORAL CARE MOUTH RINSE: 15.0000 mL | OROMUCOSAL | Status: DC | PRN

## 2023-08-05 MED ORDER — FLUCONAZOLE IN SODIUM CHLORIDE 400-0.9 MG/200ML-% IV SOLN
400.0000 mg | INTRAVENOUS | Status: AC
  Administered 2023-08-05 – 2023-08-09 (×5): 400 mg via INTRAVENOUS
  Filled 2023-08-05 (×5): qty 200

## 2023-08-05 MED ORDER — SODIUM CHLORIDE 0.9 % IV SOLN
2.0000 g | INTRAVENOUS | Status: AC
  Administered 2023-08-05: 2 g via INTRAVENOUS
  Filled 2023-08-05: qty 2

## 2023-08-05 MED ORDER — PANTOPRAZOLE SODIUM 40 MG IV SOLR: 40.0000 mg | Freq: Two times a day (BID) | INTRAVENOUS | Status: DC

## 2023-08-05 MED ORDER — CHLORHEXIDINE GLUCONATE CLOTH 2 % EX PADS: 6.0000 | MEDICATED_PAD | Freq: Once | CUTANEOUS | Status: DC

## 2023-08-05 MED ORDER — FENTANYL CITRATE (PF) 100 MCG/2ML IJ SOLN
50.0000 ug | Freq: Once | INTRAMUSCULAR | Status: AC
  Administered 2023-08-05: 50 ug via INTRAVENOUS
  Filled 2023-08-05: qty 2

## 2023-08-05 MED ORDER — SODIUM CHLORIDE 0.9% IV SOLUTION: Freq: Once | INTRAVENOUS | Status: DC

## 2023-08-05 MED ORDER — ONDANSETRON HCL 4 MG/2ML IJ SOLN
INTRAMUSCULAR | Status: AC
  Filled 2023-08-05: qty 2

## 2023-08-05 MED ORDER — SUGAMMADEX SODIUM 200 MG/2ML IV SOLN
INTRAVENOUS | Status: DC | PRN
  Administered 2023-08-05: 200 mg via INTRAVENOUS

## 2023-08-05 MED ORDER — SERTRALINE HCL 25 MG PO TABS
25.0000 mg | ORAL_TABLET | Freq: Every day | ORAL | Status: DC
  Administered 2023-08-05 – 2023-08-14 (×10): 25 mg via ORAL
  Filled 2023-08-05 (×10): qty 1

## 2023-08-05 MED ORDER — FENTANYL CITRATE (PF) 250 MCG/5ML IJ SOLN
INTRAMUSCULAR | Status: AC
  Filled 2023-08-05: qty 5

## 2023-08-05 MED ORDER — 0.9 % SODIUM CHLORIDE (POUR BTL) OPTIME
TOPICAL | Status: DC | PRN
  Administered 2023-08-05: 1000 mL

## 2023-08-05 MED ORDER — PHENYLEPHRINE 80 MCG/ML (10ML) SYRINGE FOR IV PUSH (FOR BLOOD PRESSURE SUPPORT)
PREFILLED_SYRINGE | INTRAVENOUS | Status: DC | PRN
Start: 2023-08-05 — End: 2023-08-05
  Administered 2023-08-05: 160 ug via INTRAVENOUS
  Administered 2023-08-05: 80 ug via INTRAVENOUS

## 2023-08-05 MED ORDER — PIPERACILLIN-TAZOBACTAM 3.375 G IVPB
3.3750 g | Freq: Three times a day (TID) | INTRAVENOUS | Status: AC
  Administered 2023-08-05 – 2023-08-10 (×15): 3.375 g via INTRAVENOUS
  Filled 2023-08-05 (×16): qty 50

## 2023-08-05 MED ORDER — PIPERACILLIN-TAZOBACTAM 3.375 G IVPB 30 MIN
3.3750 g | Freq: Once | INTRAVENOUS | Status: AC
  Administered 2023-08-05: 3.375 g via INTRAVENOUS
  Filled 2023-08-05: qty 50

## 2023-08-05 MED ORDER — FOLIC ACID 1 MG PO TABS
1.0000 mg | ORAL_TABLET | Freq: Every day | ORAL | Status: DC
Start: 2023-08-06 — End: 2023-08-14
  Administered 2023-08-06 – 2023-08-14 (×9): 1 mg via ORAL
  Filled 2023-08-05 (×9): qty 1

## 2023-08-05 MED ORDER — PROPOFOL 10 MG/ML IV BOLUS
INTRAVENOUS | Status: AC
  Filled 2023-08-05: qty 20

## 2023-08-05 MED ORDER — SUCCINYLCHOLINE CHLORIDE 20 MG/ML IJ SOLN
INTRAMUSCULAR | Status: DC | PRN
  Administered 2023-08-05: 100 mg via INTRAVENOUS

## 2023-08-05 MED ORDER — 0.9 % SODIUM CHLORIDE (POUR BTL) OPTIME
TOPICAL | Status: DC | PRN
Start: 2023-08-05 — End: 2023-08-05
  Administered 2023-08-05 (×3): 1000 mL

## 2023-08-05 MED ORDER — ROCURONIUM BROMIDE 10 MG/ML (PF) SYRINGE
PREFILLED_SYRINGE | INTRAVENOUS | Status: DC | PRN
  Administered 2023-08-05: 50 mg via INTRAVENOUS

## 2023-08-05 MED ORDER — ONDANSETRON HCL 4 MG/2ML IJ SOLN
4.0000 mg | Freq: Four times a day (QID) | INTRAMUSCULAR | Status: DC | PRN
  Administered 2023-08-05 – 2023-08-13 (×17): 4 mg via INTRAVENOUS
  Filled 2023-08-05 (×16): qty 2

## 2023-08-05 MED ORDER — LACTATED RINGERS IV SOLN: INTRAVENOUS | Status: DC | PRN

## 2023-08-05 MED ORDER — CEFOTETAN DISODIUM 2 G IJ SOLR
INTRAMUSCULAR | Status: AC
  Filled 2023-08-05: qty 2

## 2023-08-05 MED ORDER — CHLORHEXIDINE GLUCONATE CLOTH 2 % EX PADS
6.0000 | MEDICATED_PAD | Freq: Every day | CUTANEOUS | Status: DC
  Administered 2023-08-05 – 2023-08-14 (×10): 6 via TOPICAL

## 2023-08-05 MED ORDER — NICOTINE 14 MG/24HR TD PT24
14.0000 mg | MEDICATED_PATCH | Freq: Every day | TRANSDERMAL | Status: DC
  Filled 2023-08-05 (×4): qty 1

## 2023-08-05 MED ORDER — CHLORHEXIDINE GLUCONATE 0.12 % MT SOLN: 15.0000 mL | Freq: Once | OROMUCOSAL | Status: DC

## 2023-08-05 SURGICAL SUPPLY — 37 items
CHLORAPREP W/TINT 26 (MISCELLANEOUS) ×1 IMPLANT
CLOTH BEACON ORANGE TIMEOUT ST (SAFETY) ×1 IMPLANT
COVER LIGHT HANDLE STERIS (MISCELLANEOUS) ×2 IMPLANT
DRAPE WARM FLUID 44X44 (DRAPES) ×1 IMPLANT
DRSG OPSITE POSTOP 4X8 (GAUZE/BANDAGES/DRESSINGS) IMPLANT
ELECT BLADE 6 FLAT ULTRCLN (ELECTRODE) IMPLANT
ELECTRODE REM PT RTRN 9FT ADLT (ELECTROSURGICAL) ×1 IMPLANT
GLOVE BIO SURGEON STRL SZ 6.5 (GLOVE) ×2 IMPLANT
GLOVE BIOGEL PI IND STRL 6.5 (GLOVE) ×1 IMPLANT
GLOVE BIOGEL PI IND STRL 7.0 (GLOVE) ×3 IMPLANT
GOWN STRL REUS W/TWL LRG LVL3 (GOWN DISPOSABLE) ×3 IMPLANT
HANDLE SUCTION POOLE (INSTRUMENTS) ×1 IMPLANT
HEMOSTAT SURGICEL 4X8 (HEMOSTASIS) IMPLANT
INST SET MAJOR GENERAL (KITS) ×1 IMPLANT
KIT TURNOVER KIT A (KITS) ×1 IMPLANT
LIGASURE IMPACT 36 18CM CVD LR (INSTRUMENTS) IMPLANT
MANIFOLD NEPTUNE II (INSTRUMENTS) ×1 IMPLANT
NDL HYPO 18GX1.5 BLUNT FILL (NEEDLE) ×1 IMPLANT
NDL HYPO 21X1.5 SAFETY (NEEDLE) ×1 IMPLANT
NEEDLE HYPO 18GX1.5 BLUNT FILL (NEEDLE) ×1 IMPLANT
NEEDLE HYPO 21X1.5 SAFETY (NEEDLE) ×1 IMPLANT
NS IRRIG 1000ML POUR BTL (IV SOLUTION) ×2 IMPLANT
PACK ABDOMINAL MAJOR (CUSTOM PROCEDURE TRAY) ×1 IMPLANT
PAD ARMBOARD POSITIONER FOAM (MISCELLANEOUS) ×1 IMPLANT
PENCIL SMOKE EVACUATOR COATED (MISCELLANEOUS) ×1 IMPLANT
POSITIONER HEAD 8X9X4 ADT (SOFTGOODS) ×1 IMPLANT
SET BASIN LINEN APH (SET/KITS/TRAYS/PACK) ×1 IMPLANT
SPONGE DRAIN TRACH 4X4 STRL 2S (GAUZE/BANDAGES/DRESSINGS) IMPLANT
SPONGE T-LAP 18X18 ~~LOC~~+RFID (SPONGE) ×2 IMPLANT
STAPLER VISISTAT (STAPLE) ×1 IMPLANT
SUT ETHILON 3 0 PS 1 (SUTURE) IMPLANT
SUT PDS AB CT VIOLET #0 27IN (SUTURE) ×2 IMPLANT
SUT SILK 3 0 SH CR/8 (SUTURE) ×1 IMPLANT
SUT VIC AB 3-0 SH 27X BRD (SUTURE) IMPLANT
SYR 30ML LL (SYRINGE) ×2 IMPLANT
TAPE SURG TRANSPORE 1 IN (GAUZE/BANDAGES/DRESSINGS) IMPLANT
TRAY FOLEY MTR SLVR 16FR STAT (SET/KITS/TRAYS/PACK) ×1 IMPLANT

## 2023-08-05 NOTE — Progress Notes (Addendum)
 PROGRESS NOTE    Patient: Joan Wood                            PCP: Zarwolo, Gloria, FNP                    DOB: 1970/08/13            DOA: 08/04/2023 FMW:[id number]             DOS: 08/05/2023, 10:42 AM   LOS: 0 days   Date of Service: The patient was seen and examined on 08/05/2023  Subjective:   Postop day #0 Status post laparoscopic exploratory laparotomy for gastric perforation.  Omental patch, liver biopsy, umbilical hernia repair  Tolerated procedure well.  Awake alert oriented x 3, hemodynamically stable   Brief Narrative:   Joan Wood is a 53 y.o. female with medical history significant of anxiety, GERD, cirrhosis, hypertension who presents emergency department due to abdominal pain.   On arrival she was afebrile and hemodynamically stable, labs showed sodium 122, potassium 3.3, creatinine 0.57, WBC 5.5, hemoglobin 10.4, INR 1.3.  Patient CT abdomen pelvis which showed perforated gastric ulcer.  Surgery was consulted and she was taken to the OR for exploratory laparotomy, omental patch and hernia repair.   She was admitted to the ICU.  Procedure without complication.   Assessment/Plan Principal Problem:   Perforated abdominal viscus Active Problems:   Perforated ulcer (HCC)   Incarcerated umbilical hernia   Alcoholic cirrhosis (HCC)   Pneumoperitoneum   Perforated gastric ulcer cirrhosis, umbilical hernia with incarcerated omental and recanalized umbilical vein  Postop day #0 -S/p exploratory laparotomy: gastrorrhaphy, omental patch, liver biopsy, primary umbilical hernia repair completed    - Tolerated procedure well-hemodynamically stable IS, OOB - NPO  - NG in place, can have ice for comfort  - JP drain in place  -Zosyn , and diflucan  -Protonix  40 mg BID  Foley in place, can likely remove later today  Hold anticoagulation at this time, will reassess starting prophylaxis in the next few days  - Evaluated for PICC line as per surgery is to be n.p.o.  till Thursday Possible TPN   Hypotensive  - Postop blood pressure remained low-currently hypotensive, asymptomatic - Monitoring closely blood pressure currently at 96/57, continue IVF - Withholding vasopressors for now   decompensated alcoholic cirrhosis -trend LFTs closely monitor for peritonitis -Patient does not want her family to know about her diagnosis alcoholic liver cirrhosis   Hyponatremia -Secondary to liver cirrhosis with chronic alcohol abuse likely related to alcohol intake.   Will closely monitor in postoperative setting -Pursuing SIADH workup -Continue IVF      ----------------------------------------------------------------------------------------------------------------------------------------------- Nutritional status:  The patient's BMI is: Body mass index is 21.95 kg/m. I agree with the assessment and plan as outlined -----------------------------------------------------------------------------------------------------------------------------------------  DVT prophylaxis:  SCD's Start: 08/05/23 0521   Code Status:   Code Status: Full Code  Family Communication: No family member present at bedside-  Patient requesting her ex-husband to be kept updated-does not want her children to be notified or noted of her medical condition including liver cirrhosis  -Advance care planning has been discussed.   Admission status:   Status is: Inpatient Remains inpatient appropriate because: S/p laparoscopic abdominal surgery n.p.o. needing IV fluid IV antibiotics,   Disposition: From  - home             Planning for discharge in >3 days   Procedures:  No admission procedures for hospital encounter.   Antimicrobials:  Anti-infectives (From admission, onward)    Start     Dose/Rate Route Frequency Ordered Stop   08/05/23 1000  piperacillin -tazobactam (ZOSYN ) IVPB 3.375 g        3.375 g 12.5 mL/hr over 240 Minutes Intravenous Every 8 hours 08/05/23 0531  08/10/23 1359   08/05/23 0630  fluconazole  (DIFLUCAN ) IVPB 400 mg        400 mg 100 mL/hr over 120 Minutes Intravenous Every 24 hours 08/05/23 0533 08/10/23 0629   08/05/23 0600  cefoTEtan  (CEFOTAN ) 2 g in sodium chloride  0.9 % 100 mL IVPB        2 g 200 mL/hr over 30 Minutes Intravenous On call to O.R. 08/05/23 0138 08/05/23 0427   08/05/23 0253  sodium chloride  0.9 % with cefoTEtan  (CEFOTAN ) ADS Med       Note to Pharmacy: Joshua Planas S: cabinet override      08/05/23 0253 08/05/23 0359   08/05/23 0115  piperacillin -tazobactam (ZOSYN ) IVPB 3.375 g        3.375 g 100 mL/hr over 30 Minutes Intravenous  Once 08/05/23 0112 08/05/23 0155        Medication:   chlorhexidine   15 mL Mouth/Throat Once   Chlorhexidine  Gluconate Cloth  6 each Topical Daily   [START ON 08/06/2023] folic acid   1 mg Oral Daily   ondansetron        pantoprazole  (PROTONIX ) IV  40 mg Intravenous Q12H   sertraline   25 mg Oral Daily   [START ON 08/06/2023] sucralfate   1 g Oral TID WC & HS    morphine  injection, ondansetron , ondansetron  (ZOFRAN ) IV, mouth rinse, phenol   Objective:   Vitals:   08/05/23 0658 08/05/23 0700 08/05/23 0800 08/05/23 0900  BP:  (!) 106/59 (!) 104/55 (!) 96/57  Pulse:  (!) 108 99 100  Resp:  13 12 11   Temp: 99.2 F (37.3 C)     TempSrc: Oral     SpO2:  96% 97% 96%  Weight:      Height:        Intake/Output Summary (Last 24 hours) at 08/05/2023 1042 Last data filed at 08/05/2023 0900 Gross per 24 hour  Intake 1231.5 ml  Output 505 ml  Net 726.5 ml   Filed Weights   08/04/23 2317  Weight: 54.4 kg     Physical examination:   General:  AAO x 3,  cooperative, no distress;   HEENT:  Normocephalic, PERRL, otherwise with in Normal limits   Neuro:  CNII-XII intact. , normal motor and sensation, reflexes intact   Lungs:   Clear to auscultation BL, Respirations unlabored,  No wheezes / crackles  Cardio:    S1/S2, RRR, No murmure, No Rubs or Gallops   Abdomen:  Soft, diffuse  tenderness, negative for any distention, JP drain in place, surgical wound dressing in place, hypoactive bowel sounds  Muscular  skeletal:  Limited exam -global generalized weaknesses - in bed, able to move all 4 extremities,   2+ pulses,  symmetric, No pitting edema  Skin:  Dry, warm to touch, negative for any Rashes, Postop surgical abdominal wound -negative erythema edema  Wounds: Abdominal surgical wound dressing in place JP drain in place       ------------------------------------------------------------------------------------------------------------------------------------------    LABs:     Latest Ref Rng & Units 08/04/2023   11:19 PM 07/30/2023    9:39 AM 05/29/2023    4:58 AM  CBC  WBC 4.0 -  10.5 K/uL 5.5  4.2  3.6   Hemoglobin 12.0 - 15.0 g/dL 89.5  9.4  8.3   Hematocrit 36.0 - 46.0 % 31.2  29.1  24.8   Platelets 150 - 400 K/uL 123  130  188       Latest Ref Rng & Units 08/05/2023    6:42 AM 08/04/2023   11:19 PM 07/30/2023    9:39 AM  CMP  Glucose 70 - 99 mg/dL 92  873  87   BUN 6 - 20 mg/dL 8  7  5    Creatinine 0.44 - 1.00 mg/dL 9.39  9.42  9.58   Sodium 135 - 145 mmol/L 126  122  132   Potassium 3.5 - 5.1 mmol/L 4.0  3.3  4.6   Chloride 98 - 111 mmol/L 98  88  96   CO2 22 - 32 mmol/L 18  19  21    Calcium  8.9 - 10.3 mg/dL 7.2  7.9  8.7   Total Protein 6.5 - 8.1 g/dL  7.7  8.1   Total Bilirubin 0.0 - 1.2 mg/dL  1.7  1.0   Alkaline Phos 38 - 126 U/L  86  111   AST 15 - 41 U/L  116  138   ALT 0 - 44 U/L  33  35        Micro Results No results found for this or any previous visit (from the past 240 hours).  Radiology Reports DG Abd 1 View Result Date: 08/05/2023 CLINICAL DATA:  NG tube placement. EXAM: ABDOMEN - 1 VIEW COMPARISON:  None Available. FINDINGS: NG tube tip is in the region of the gastric fundus. Side port of the NG tube is well below the GE junction. Mild gaseous distention of the stomach evident. Surgical drain overlies the midline upper  abdomen. IMPRESSION: NG tube tip is in the region of the gastric fundus. Electronically Signed   By: Camellia Candle M.D.   On: 08/05/2023 06:43   CT ABDOMEN PELVIS W CONTRAST Result Date: 08/05/2023 CLINICAL DATA:  Acute abdominal pain for 2 days, initial encounter EXAM: CT ABDOMEN AND PELVIS WITH CONTRAST TECHNIQUE: Multidetector CT imaging of the abdomen and pelvis was performed using the standard protocol following bolus administration of intravenous contrast. RADIATION DOSE REDUCTION: This exam was performed according to the departmental dose-optimization program which includes automated exposure control, adjustment of the mA and/or kV according to patient size and/or use of iterative reconstruction technique. CONTRAST:  OMNIPAQUE  IOHEXOL  300 MG/ML  SOLN COMPARISON:  05/27/2023 FINDINGS: Lower chest: Lung bases are free of acute infiltrate or sizable effusion. Hepatobiliary: Diffuse decreased attenuation is noted throughout the liver likely related to underlying cirrhotic change. Mild nodularity is seen. Recanalization of the umbilical vein is noted which subsequently passes through a umbilical hernia and than decompresses via collaterals into the left external iliac vein. Gallbladder is well distended without cholelithiasis. Some mild pericholecystic fluid is noted related to the known perforation. Pancreas: Unremarkable. No pancreatic ductal dilatation or surrounding inflammatory changes. Spleen: Normal in size without focal abnormality. Adrenals/Urinary Tract: Adrenal glands are within normal limits. Kidneys show no renal calculi or obstructive changes. The bladder is well distended. Stomach/Bowel: No obstructive or inflammatory changes of the colon are seen. The appendix again demonstrates appendicoliths distally although no significant changes to suggest acute appendicitis are noted. Small bowel is within normal limits. There remains thickening of the gastric wall similar to that seen on the prior  exam. The outpouching of air suspicious  for ulceration on the prior exam now shows free communication through the anterior gastric wall along the posterior aspect of the left lobe of the liver consistent with perforated ulcer. Considerable free air is noted. Free fluid is noted as well related to these changes. Small hiatal hernia is noted. Vascular/Lymphatic: Aortic atherosclerosis. No enlarged abdominal or pelvic lymph nodes. Reproductive: Uterus and bilateral adnexa are unremarkable. Other: Free fluid and free air is noted related to the gastric ulcer perforation. Fat containing umbilical hernia is noted. 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 These changes are stable from the prior exam. Musculoskeletal: Old left rib fractures are noted posteriorly. No acute bony abnormality is noted. Anterolisthesis of L4 on L5 is again noted. IMPRESSION: Changes consistent with perforated anterior gastric ulcer progressed in the interval from the prior exam. Considerable free air and free fluid is noted related to the perforation. 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 Prominent appendix with single appendicolith within. This is stable in appearance from the prior exam without inflammatory change. Critical Value/emergent results were called by telephone at the time of interpretation on 08/05/2023 at 1:06 am to Dr. DONALD WICKLINE , who verbally acknowledged these results. Electronically Signed   By: Oneil Devonshire M.D.   On: 08/05/2023 01:12   DG Chest Portable 1 View Result Date: 08/04/2023 CLINICAL DATA:  Dyspnea EXAM: PORTABLE CHEST 1 VIEW COMPARISON:  CT 05/12/2023 FINDINGS: The heart size and mediastinal contours are within normal limits. Both lungs are clear. The visualized skeletal structures are unremarkable. IMPRESSION: No active disease. Electronically Signed   By: Dorethia Molt M.D.   On: 08/04/2023 23:50    SIGNED: Adriana DELENA Grams,  MD, FHM. FAAFP. [facility] - [facility] Critical care time spent - 55 min.  In seeing, evaluating and examining the patient. Reviewing medical records, labs, drawn plan of care. [facility],  Pager (please use [email or web address] to page/ text) Please use Epic Secure Chat for non-urgent communication (7AM-7PM)   If 7PM-7AM, please contact night-coverage [email or web address], 08/05/2023, 10:42 AM

## 2023-08-05 NOTE — Progress Notes (Signed)
 [facility]   Amijah Timothy (daughter) [phone] Ozell Sheller (ex-husband) [phone] [name] Baumgartner (room mate/best friend) [phone]  Patient's next of kin is daughter Duwaine. She refused to let me call her tonight due to her being asleep and having children. She says I can call her after 8AM.  Discussed with patient and Jarratt risk of surgery with Ex lap, gastric repair, omental patch, possible liver biopsy (given history of cirrhosis and GI notes wanting biopsy) including risk of bleeding, infection, JP drain placement, cirrhosis decompensation, Risk of mortality 30% with Child Pugh B class. Discussed risk of central line /arterial line placement including injury to vessels, infection, pneumothorax.  Discussed likelihood of ICU placement, potential need to stay intubated and transfer to [facility].  Manuelita Pander, MD [facility] [address] [place], [name] [address] [phone] (office)

## 2023-08-05 NOTE — Progress Notes (Addendum)
 [facility]  Ex lap, gastrorrhaphy, omental patch, liver biopsy, primary umbilical hernia repair completed.  Updated Jarratt and team. Staying at [facility] ICU. No pressors needed during surgery and able to extubate.   PRN for pain NO NSAIDS IS, OOB HD stable during the OR Npo, NG in place, can have ice for comfort  KUB ordered to confirm NG JP drain in place Labs this AM Zosyn , and diflucan  for intraperitoneal contamination for 5 days per the STOP it trial Protonix  40 mg BID Foley in place, can likely remove later today  Hold anticoagulation at this time, will reassess starting prophylaxis in the next few days   Manuelita Pander, MD [facility] [address] [place], [name] [address] [phone] (office)

## 2023-08-05 NOTE — Anesthesia Preprocedure Evaluation (Signed)
 Anesthesia Evaluation  Patient identified by MRN, date of birth, ID band Patient awake    Reviewed: Allergy & Precautions, H&P , NPO status , Patient's Chart, lab work & pertinent test results, reviewed documented beta blocker date and time   Airway Mallampati: II  TM Distance: >3 FB Neck ROM: full    Dental no notable dental hx.    Pulmonary neg pulmonary ROS, Current Smoker   Pulmonary exam normal breath sounds clear to auscultation       Cardiovascular Exercise Tolerance: Good hypertension, negative cardio ROS  Rhythm:regular Rate:Normal     Neuro/Psych  PSYCHIATRIC DISORDERS Anxiety Depression    negative neurological ROS  negative psych ROS   GI/Hepatic negative GI ROS, PUD,GERD  ,,(+) Cirrhosis         Endo/Other  negative endocrine ROS    Renal/GU negative Renal ROS  negative genitourinary   Musculoskeletal   Abdominal   Peds  Hematology negative hematology ROS (+)   Anesthesia Other Findings   Reproductive/Obstetrics negative OB ROS                              Anesthesia Physical Anesthesia Plan  ASA: 4 and emergent  Anesthesia Plan: General and General ETT   Post-op Pain Management:    Induction:   PONV Risk Score and Plan: Ondansetron   Airway Management Planned:   Additional Equipment:   Intra-op Plan:   Post-operative Plan:   Informed Consent: I have reviewed the patients History and Physical, chart, labs and discussed the procedure including the risks, benefits and alternatives for the proposed anesthesia with the patient or authorized representative who has indicated his/her understanding and acceptance.     Dental Advisory Given  Plan Discussed with: CRNA  Anesthesia Plan Comments:         Anesthesia Quick Evaluation

## 2023-08-05 NOTE — H&P (Signed)
 History and Physical    Joan Wood FMW:[id number] DOB: 05-20-1970 DOA: 08/04/2023  PCP: Zarwolo, Gloria, FNP   Chief Complaint: abd pain  HPI: Joan Wood is a 53 y.o. female with medical history significant of anxiety, GERD, cirrhosis, hypertension who presents emergency department due to abdominal pain.  On arrival she was afebrile and hematin stable labs were obtained on presentation which showed sodium 122, potassium 3.3, creatinine 0.57, WBC 5.5, hemoglobin 10.4, INR 1.3.  Patient CT abdomen pelvis which showed perforated gastric ulcer.  Surgery was consulted and she was taken to the OR for exploratory laparotomy, omental patch and hernia repair.  She was admitted to the ICU and perioperative setting.  Procedure without complication.  Patient was admitted for further workup.   Review of Systems: Review of Systems  Constitutional:  Negative for chills and fever.  HENT: Negative.    Eyes: Negative.   Respiratory: Negative.    Cardiovascular: Negative.   Gastrointestinal:  Positive for abdominal pain.  Genitourinary: Negative.   Musculoskeletal: Negative.   Skin: Negative.   Neurological: Negative.   Endo/Heme/Allergies: Negative.   Psychiatric/Behavioral: Negative.       As per HPI otherwise 10 point review of systems negative.   Allergies  Allergen Reactions   Bee Venom Anaphylaxis    Cannot take epi for this allergy d/t heart condition per pt   Latex Rash    Past Medical History:  Diagnosis Date   Anxiety    GERD (gastroesophageal reflux disease)    Hernia of abdominal wall    HTN (hypertension)    SVT (supraventricular tachycardia) (HCC)     Past Surgical History:  Procedure Laterality Date   ESOPHAGOGASTRODUODENOSCOPY N/A 05/17/2023   Procedure: EGD (ESOPHAGOGASTRODUODENOSCOPY);  Surgeon: Cindie Carlin POUR, DO;  Location: [facility];  Service: Endoscopy;  Laterality: N/A;   FINGER FRACTURE SURGERY     right ankle repair     age 20     reports that  she has been smoking cigarettes. She started smoking about 16 months ago. She has a 28.5 pack-year smoking history. She has never used smokeless tobacco. She reports current alcohol use. She reports that she does not use drugs.  Family History  Problem Relation Age of Onset   Cancer Mother    Leukemia Mother    Multiple myeloma Mother    Cancer Father        unsure what kind   Cancer - Colon Neg Hx    Colon polyps Neg Hx     Prior to Admission medications   Medication Sig Start Date End Date Taking? Authorizing Provider  acetaminophen  (TYLENOL ) 500 MG tablet Take 2 tablets (1,000 mg total) by mouth every 6 (six) hours as needed for moderate pain (pain score 4-6). 05/29/23   Evonnie Lenis, MD  bismuth subsalicylate (PEPTO BISMOL) 262 MG/15ML suspension Take 30 mLs by mouth every 6 (six) hours as needed for diarrhea or loose stools.    [provider]  Elastic Bandages & Supports (FITRITE BACK BRACE WITH PULLEY) MISC 1 Units by Does not apply route daily. 05/25/23   Bevely Doffing, FNP  folic acid  (FOLVITE ) 1 MG tablet Take 1 tablet (1 mg total) by mouth daily. 03/29/23   Zarwolo, Gloria, FNP  gabapentin  (NEURONTIN ) 100 MG capsule Take 100 mg by mouth daily as needed (nerve pain).    [provider]  Misc. Devices MISC Closed toe walking boot for right foot toe fractures Dx: D07.698 05/25/23   Huenink,  Leita, FNP  omeprazole  (PRILOSEC) 40 MG capsule Take 1 capsule (40 mg total) by mouth 2 (two) times daily. 05/29/23   Evonnie Lenis, MD  ondansetron  (ZOFRAN ) 4 MG tablet Take 1 tablet (4 mg total) by mouth 2 (two) times daily as needed for nausea or vomiting. 04/23/23   Bevely Leita, FNP  pyridOXINE (VITAMIN B6) 50 MG tablet Take 1 tablet (50 mg total) by mouth daily. 03/29/23   Zarwolo, Gloria, FNP  sertraline  (ZOLOFT ) 25 MG tablet Take 1 tablet (25 mg total) by mouth daily. 07/30/23   Zarwolo, Gloria, FNP  sucralfate  (CARAFATE ) 1 g tablet Take 1 tablet (1 g total) by mouth 4 (four) times  daily -  with meals and at bedtime. 05/29/23   Evonnie Lenis, MD  Vitamin D , Ergocalciferol , (DRISDOL ) 1.25 MG (50000 UNIT) CAPS capsule Take 1 capsule (50,000 Units total) by mouth every 7 (seven) days. 08/04/23   Zarwolo, Gloria, FNP    Physical Exam: Vitals:   08/05/23 0130 08/05/23 0200 08/05/23 0300 08/05/23 0520  BP: 102/67 (!) 93/58 103/66 (!) 101/44  Pulse: (!) 103 (!) 102 (!) 108 (!) 110  Resp:   18 19  Temp:      TempSrc:      SpO2: 100% 100% 100% 93%  Weight:      Height:       Physical Exam Constitutional:      Appearance: She is normal weight.  HENT:     Head: Normocephalic.  Eyes:     Extraocular Movements: Extraocular movements intact.  Cardiovascular:     Rate and Rhythm: Normal rate.     Heart sounds: Normal heart sounds.  Pulmonary:     Effort: Pulmonary effort is normal.  Abdominal:     General: Abdomen is flat. Bowel sounds are normal.     Palpations: Abdomen is soft.  Skin:    General: Skin is warm.     Capillary Refill: Capillary refill takes less than 2 seconds.  Neurological:     General: No focal deficit present.     Mental Status: She is alert.  Psychiatric:        Mood and Affect: Mood normal.      Labs on Admission: I have personally reviewed the patients's labs and imaging studies.  Assessment/Plan Principal Problem:   Perforated abdominal viscus Active Problems:   Perforated ulcer (HCC)   Incarcerated umbilical hernia   Alcoholic cirrhosis (HCC)   Pneumoperitoneum   # Perforated gastric ulcer - Underwent surgical intervention  Plan: PRN analgesia for pain IS, OOB Npo, NG in place, can have ice for comfort  KUB ordered to confirm NG JP drain in place Labs this AM Zosyn , and diflucan  Protonix  40 mg BID Foley in place, can likely remove later today  Hold anticoagulation at this time, will reassess starting prophylaxis in the next few days  # Decompensated alcoholic cirrhosis-trend LFTs closely monitor for peritonitis  #  Hyponatremia-likely related to alcohol intake.  Will closely monitor in postoperative setting  Admission status: Inpatient ICU  Certification: The appropriate patient status for this patient is INPATIENT. Inpatient status is judged to be reasonable and necessary in order to provide the required intensity of service to ensure the patient's safety. The patient's presenting symptoms, physical exam findings, and initial radiographic and laboratory data in the context of their chronic comorbidities is felt to place them at high risk for further clinical deterioration. Furthermore, it is not anticipated that the patient will be medically stable for discharge  from the hospital within 2 midnights of admission.   * I certify that at the point of admission it is my clinical judgment that the patient will require inpatient hospital care spanning beyond 2 midnights from the point of admission due to high intensity of service, high risk for further deterioration and high frequency of surveillance required.DEWAINE Lamar Dess MD [facility] If 7PM-7AM, please contact night-coverage [email or web address]  08/05/2023, 6:01 AM

## 2023-08-05 NOTE — Progress Notes (Signed)
 [facility]  Patient now refusing to let me call Megan. She wants to update Ozell her Ex husband herself. Report she does not want her children to know about the cirrhosis.   I have updated the team about this request.   Says she is having some pain. Did not want to get up to the chair earlier. NG had to be replaced out it was pulled out on transport. KUB with it in the fundus.   BP (!) 96/57   Pulse 100   Temp 99.2 F (37.3 C) (Oral)   Resp 11   Ht 5' 2 (1.575 m)   Wt 54.4 kg   SpO2 96%   BMI 21.95 kg/m  Soft, mildly distended, appropriately tender, JP with SS output, dressing in place  Patient with perforated gastric ulcer in the setting of cirrhosis and umbilical hernia. S/p Ex lap, gastrorraphy, omental patch, liver biopsy, primary umbilical hernia repair. Doing fair.  PRN for pain Avoid NSAIDs Hemodynamically improving NPO, NG, can have ice and sips with meds Can ambulate off suction PPI BID Zosyn  and diflucan  for 5 days (until 7/31) per the STOP it trial Culture obtained JP in place UGI plan for Thursday as long as patient continues to improve and radiology is available Labs with improving Na Foley in place, can dc later when appropriate Hold prophylaxis for now given cirrhosis and recent surgery SCDs  I have not called Duwaine or anyone else this AM since the surgery. The patient does not want me to call Duwaine or anyone now and the RN, Annabella witnessed the patient requesting that I do no call and update anyone. The patient will update Ozell herself.   Manuelita Pander, MD [facility] [address] [address], [name] [address] [phone] (office)

## 2023-08-05 NOTE — Op Note (Addendum)
 [facility] Operative Note  08/05/23  Preoperative Diagnosis: Gastric perforation, free air, cirrhosis   Postoperative Diagnosis:  Gastric perforation, free air, cirrhosis, umbilical hernia with incarcerated omental and recanalized umbilical vein   Procedure(s) Performed: Exploratory laparotomy, gastrorrhaphy, omental patch, wedge liver biopsy, primary repair umbilical hernia 1cm, JP drain placement    Surgeon: Manuelita BROCKS. Kallie, MD   Assistants: No qualified resident was available    Anesthesia: General endotracheal   Anesthesiologist: Dr. Kendell, MD    Specimens: Culture peritoneal fluid; liver biopsy, umbilical hernia sac    Estimated Blood Loss: Minimal   Blood Replacement: None    Complications: None   Wound Class: Dirty infected   Operative Indications: Ms. Perazzo is a 53 yo with a gastric perforation, free air, and cirrhosis who came in with pain. We discussed surgery and risk related to surgery and increased risk with cirrhosis including bleeding, infection, decompensation, needing ICU care, possibility of staying intubated. I reviewed her CT carefully given her cirrhosis and recanalized umbilical vein to plan my approach to prevent bleeding and injury to the vessel.   Findings: Upon entering the abdomen (organ space), I encountered a phlegmon involving the anterior gastric wall and omentum with bilious ascites and a large 1cm gastric perforation.    Procedure: The patient was taken to the operating room and placed supine. General endotracheal anesthesia was induced. Intravenous antibiotics were  administered per protocol.  A nasogastric tube positioned to decompress the stomach. A foley was placed. The abdomen was prepped and draped in the usual sterile fashion.   An upper midline incision was made and carried down to the fascia with care. I entered the fascia and opened the fascia superiorly first then inferiorly to the hernia sac. I noted the hernia  sac with the umbilical vein traveling via the falciform to the hernia sac. With the ligasure, I ligated the umbilical vein at the hernia sac and dissected out the hernia sac to complete my laparotomy.  The umbilical defect was 1cm in size and the bridge of fascia between it and my laparotomy was transected. Upon entering the abdomen (organ space), I encountered a phlegmon involving the anterior gastric wall and omentum with bilious ascites and a large 1cm gastric perforation.  Suction was performed after I cultured the fluid.  An anterior gastric perforation was noted. I proceeded to repair this with 3-0 Silk suture Lemberted over the defect. I then took a tongue of omentum and tacked it over the repair. Irrigation was performed.    I took a wedge of liver for biopsy with cautery. I ensured there was not bleeding from the liver edge. Surgicel was placed in the biopsy site.    A JP drain was place over the repair and brought out through telt upper abdomen and secured with a 3-0 Nylon suture.  The abdomen was closed in the standard fashion with 0 PDS suture. I started at the level of the umbilicus and the prior umbilical hernia defect, ensuring that I closed the 1cm fascial defect that had been present here. I visualized this part of the closure given the umbilical vein that had run in proximity to the area.  No bleeding was noted.  I completed my fascial closure in the standard fashion.  I then closed the subcutaneous tissue with 3-0 Vicryl interrupted to help with any issues with healing or leakage of ascites that could occur in a cirrhotic.    The skin was then closed with staples and dressings  were placed.   Final inspection revealed acceptable hemostasis. All counts were correct at the end of the case. The patient was awakened from anesthesia and extubate without complication.  She did not require any pressors during the case. The patient went to the ICU in stable condition.   Manuelita Pander,  MD [facility] [address] [place], [name] [address] [phone] (office)

## 2023-08-05 NOTE — Plan of Care (Signed)
   Problem: Coping: Goal: Level of anxiety will decrease Outcome: Progressing   Problem: Pain Managment: Goal: General experience of comfort will improve and/or be controlled Outcome: Progressing

## 2023-08-05 NOTE — Transfer of Care (Signed)
 Immediate Anesthesia Transfer of Care Note  Patient: Joan Wood  Procedure(s) Performed: GASTRORRHAPHY LAPAROTOMY, EXPLORATORY BIOPSY, LIVER REPAIR, HERNIA, UMBILICAL, ADULT  Patient Location: PACU and SICU  Anesthesia Type:General  Level of Consciousness: awake, alert , and oriented  Airway & Oxygen Therapy: Patient Spontanous Breathing  Post-op Assessment: Report given to RN and Post -op Vital signs reviewed and stable  Post vital signs: Reviewed and stable  Last Vitals:  Vitals Value Taken Time  BP 110/41 08/05/23 05:05  Temp    Pulse 117 08/05/23 05:07  Resp 18 08/05/23 05:07  SpO2 94 % 08/05/23 05:07  Vitals shown include unfiled device data.  Last Pain:  Vitals:   08/05/23 0125  TempSrc:   PainSc: 6          Complications: No notable events documented.

## 2023-08-05 NOTE — ED Notes (Signed)
 Surgical team has been called in. Kellogg RN

## 2023-08-05 NOTE — ED Notes (Signed)
 Witnessed consents signed by Dr Kallie and the patient. Lacinda her friend was called to be informed of surgery. Patient refused to have her daughter called at this time of night. Gave report and turned over care to [facility] Or nurse.

## 2023-08-05 NOTE — Anesthesia Procedure Notes (Signed)
 Procedure Name: Intubation Date/Time: 08/05/2023 3:45 AM  Performed by: Kendell Yvonna PARAS, MDPre-anesthesia Checklist: Patient identified, Emergency Drugs available, Suction available, Patient being monitored and Timeout performed Patient Re-evaluated:Patient Re-evaluated prior to induction Oxygen Delivery Method: Circle system utilized Preoxygenation: Pre-oxygenation with 100% oxygen Induction Type: IV induction Laryngoscope Size: Glidescope and 3 Grade View: Grade I Tube type: Oral Tube size: 8.0 mm Number of attempts: 1 Airway Equipment and Method: Video-laryngoscopy and Stylet Placement Confirmation: ETT inserted through vocal cords under direct vision, positive ETCO2 and breath sounds checked- equal and bilateral Secured at: 22 cm Tube secured with: Tape Dental Injury: Teeth and Oropharynx as per pre-operative assessment

## 2023-08-05 NOTE — Progress Notes (Addendum)
 [facility]   Patient with gastric perforation in the setting of cirrhosis. Needs Ex lap gastric repair patch. Child Pugh Class B (7) and Meld 11. Perioperative mortality 30%. High risk patient/ surgery.   CT reviewed umbilical recannulization and perforation of the gastric ulcer noted. Na 122 with some history of chronically low Na.   Patient may need to remain intubated post operatively, but difficult to say at this time. Hospitalist can assess and determine if they want to admit to ICU at [facility] or plan to preemptively admit to [facility] post operatively.    Type and Cross ordered for Stat.  Fluids Zosyn   Manuelita Pander, MD [facility] [address] [place], [name] [address] [phone] (office)

## 2023-08-05 NOTE — Progress Notes (Signed)
   08/05/23 1615  TOC Brief Assessment  Insurance and Status Reviewed  Patient has primary care physician Yes  Home environment has been reviewed From home  Prior level of function: Independent  Prior/Current Home Services No current home services  Social Drivers of Health Review SDOH reviewed no interventions necessary  Readmission risk has been reviewed Yes  Transition of care needs no transition of care needs at this time   Transition of Care Department [facility]) has reviewed patient and no TOC needs have been identified at this time. We will continue to monitor patient advancement through interdisciplinary progression rounds. If new patient transition needs arise, please place a TOC consult.

## 2023-08-05 NOTE — H&P (Signed)
 [facility] History and Physical  Reason for Referral: Gastric perforation, free air, cirrhosis  Referring Physician:  Dr. Midge   Chief Complaint   Abdominal Pain     Joan Wood is a 53 y.o. female.  HPI:   [name] is a patient with cirrhosis, GERD, anxeity, prior Svt who comes in with severe abdominal pain and CT findings with gastric perforation and free air. She has been having pain and nausea/vomiting and diarrhea. She says her emesis and diarrhea have been bloody. She has prior history of GIB s/p EGD in May 2025. She has been drinking alcohol but is oriented X 4.   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    I asked patient who her next of kin was and she gave me Joan Wood's number, her daughter. She did not want us  to call Duwaine so we talked to Joan Wood her roommate/ best friend during the interview, so some friend/ family was aware of the proceedings.    Past Medical History:  Diagnosis Date   Anxiety    GERD (gastroesophageal reflux disease)    Hernia of abdominal wall    HTN (hypertension)    SVT (supraventricular tachycardia) (HCC)     Past Surgical History:  Procedure Laterality Date   ESOPHAGOGASTRODUODENOSCOPY N/A 05/17/2023   Procedure: EGD (ESOPHAGOGASTRODUODENOSCOPY);  Surgeon: Cindie Carlin POUR, DO;  Location: [facility];  Service: Endoscopy;  Laterality: N/A;   FINGER FRACTURE SURGERY     right ankle repair     age 55    Family History  Problem Relation Age of Onset   Cancer Mother    Leukemia Mother    Multiple myeloma Mother    Cancer Father        unsure what kind   Cancer - Colon Neg Hx    Colon polyps Neg Hx     Social History   Tobacco Use   Smoking status: Every Day    Current packs/day: 0.50    Average packs/day: 0.7 packs/day for 38.4 years (28.5 ttl pk-yrs)    Types: Cigarettes    Start date: 03/2022   Smokeless tobacco: Never  Vaping Use   Vaping status: Never Used  Substance  Use Topics   Alcohol use: Yes    Comment: beer, 4-6 /day   Drug use: Never    Medications: I have reviewed the patient's current medications. Current Facility-Administered Medications  Medication Dose Route Frequency Provider Last Rate Last Admin   0.9 %  sodium chloride  infusion (Manually program via Guardrails IV Fluids)   Intravenous Once Kallie Manuelita BROCKS, MD       cefoTEtan  (CEFOTAN ) 2 g in sodium chloride  0.9 % 100 mL IVPB  2 g Intravenous On Call to OR Kallie Manuelita BROCKS, MD       Chlorhexidine  Gluconate Cloth 2 % PADS 6 each  6 each Topical Once Kallie Manuelita BROCKS, MD       sodium chloride  0.9 % with cefoTEtan  (CEFOTAN ) ADS Med            Current Outpatient Medications  Medication Sig Dispense Refill Last Dose/Taking   acetaminophen  (TYLENOL ) 500 MG tablet Take 2 tablets (1,000 mg total) by mouth every 6 (six) hours as needed for moderate pain (pain score 4-6).      bismuth subsalicylate (PEPTO BISMOL) 262 MG/15ML suspension Take 30 mLs by mouth every 6 (six) hours as needed for diarrhea or loose stools.      Elastic Bandages &  Supports (FITRITE BACK BRACE WITH PULLEY) MISC 1 Units by Does not apply route daily. 1 each 0    folic acid  (FOLVITE ) 1 MG tablet Take 1 tablet (1 mg total) by mouth daily. 60 tablet 1    gabapentin  (NEURONTIN ) 100 MG capsule Take 100 mg by mouth daily as needed (nerve pain).      Misc. Devices MISC Closed toe walking boot for right foot toe fractures Dx: S92.301 1 each 0    omeprazole  (PRILOSEC) 40 MG capsule Take 1 capsule (40 mg total) by mouth 2 (two) times daily. 60 capsule 1    ondansetron  (ZOFRAN ) 4 MG tablet Take 1 tablet (4 mg total) by mouth 2 (two) times daily as needed for nausea or vomiting. 20 tablet 0    pyridOXINE (VITAMIN B6) 50 MG tablet Take 1 tablet (50 mg total) by mouth daily. 30 tablet 1    sertraline  (ZOLOFT ) 25 MG tablet Take 1 tablet (25 mg total) by mouth daily. 60 tablet 3    sucralfate  (CARAFATE ) 1 g tablet Take 1 tablet (1 g  total) by mouth 4 (four) times daily -  with meals and at bedtime. 120 tablet 1    Vitamin D , Ergocalciferol , (DRISDOL ) 1.25 MG (50000 UNIT) CAPS capsule Take 1 capsule (50,000 Units total) by mouth every 7 (seven) days. 25 capsule 1      Allergies  Allergen Reactions   Bee Venom Anaphylaxis    Cannot take epi for this allergy d/t heart condition per pt   Latex Rash      ROS:  A comprehensive review of systems was negative except for: Gastrointestinal: positive for abdominal pain, diarrhea, nausea, and vomiting  Blood pressure (!) 93/58, pulse (!) 102, temperature 98.6 F (37 C), temperature source Oral, resp. rate 18, height 5' 2 (1.575 m), weight 54.4 kg, SpO2 100%. Physical Exam Vitals reviewed.  HENT:     Head: Normocephalic.  Eyes:     Extraocular Movements: Extraocular movements intact.  Cardiovascular:     Rate and Rhythm: Tachycardia present.  Pulmonary:     Effort: Pulmonary effort is normal.  Abdominal:     General: There is distension.     Tenderness: There is abdominal tenderness in the epigastric area. There is guarding. There is no rebound.  Musculoskeletal:     Comments: Moves all extremities   Skin:    General: Skin is warm.  Neurological:     General: No focal deficit present.     Mental Status: She is alert and oriented to person, place, and time.  Psychiatric:        Mood and Affect: Mood normal.        Behavior: Behavior normal.      Results: Results for orders placed or performed during the hospital encounter of 08/04/23 (from the past 48 hours)  ABO/Rh     Status: None   Collection Time: 08/04/23 11:14 PM  Result Value Ref Range   ABO/RH(D)      A POS Performed at [facility], [address], [place], [name] [address]   Lipase, blood     Status: Abnormal   Collection Time: 08/04/23 11:19 PM  Result Value Ref Range   Lipase 68 (H) 11 - 51 U/L    Comment: Performed at [facility], [address] [place], [name] [address]   Comprehensive metabolic panel     Status: Abnormal   Collection Time: 08/04/23 11:19 PM  Result Value Ref Range   Sodium 122 (  L) 135 - 145 mmol/L   Potassium 3.3 (L) 3.5 - 5.1 mmol/L   Chloride 88 (L) 98 - 111 mmol/L   CO2 19 (L) 22 - 32 mmol/L   Glucose, Bld 126 (H) 70 - 99 mg/dL    Comment: Glucose reference range applies only to samples taken after fasting for at least 8 hours.   BUN 7 6 - 20 mg/dL   Creatinine, Ser 9.42 0.44 - 1.00 mg/dL   Calcium  7.9 (L) 8.9 - 10.3 mg/dL   Total Protein 7.7 6.5 - 8.1 g/dL   Albumin 3.1 (L) 3.5 - 5.0 g/dL   AST 883 (H) 15 - 41 U/L   ALT 33 0 - 44 U/L   Alkaline Phosphatase 86 38 - 126 U/L   Total Bilirubin 1.7 (H) 0.0 - 1.2 mg/dL   GFR, Estimated >39 >39 mL/min    Comment: (NOTE) Calculated using the CKD-EPI Creatinine Equation (2021)    Anion gap 15 5 - 15    Comment: Performed at [facility], [address], [place], [name] [address]  CBC     Status: Abnormal   Collection Time: 08/04/23 11:19 PM  Result Value Ref Range   WBC 5.5 4.0 - 10.5 K/uL   RBC 3.47 (L) 3.87 - 5.11 MIL/uL   Hemoglobin 10.4 (L) 12.0 - 15.0 g/dL   HCT 68.7 (L) 63.9 - 53.9 %   MCV 89.9 80.0 - 100.0 fL   MCH 30.0 26.0 - 34.0 pg   MCHC 33.3 30.0 - 36.0 g/dL   RDW 81.6 (H) 88.4 - 84.4 %   Platelets 123 (L) 150 - 400 K/uL    Comment: SPECIMEN CHECKED FOR CLOTS REPEATED TO VERIFY    nRBC 0.0 0.0 - 0.2 %    Comment: Performed at [facility], [address], [place], [name] [address]  Protime-INR     Status: Abnormal   Collection Time: 08/04/23 11:27 PM  Result Value Ref Range   Prothrombin Time 16.6 (H) 11.4 - 15.2 seconds   INR 1.3 (H) 0.8 - 1.2    Comment: (NOTE) INR goal varies based on device and disease states. Performed at [facility], [address] [place], [name] [address]   APTT     Status: None   Collection Time: 08/04/23 11:27 PM  Result Value Ref Range   aPTT 33 24 - 36 seconds    Comment: Performed at [facility], [address],  [place], [name] [address]  Ethanol     Status: Abnormal   Collection Time: 08/04/23 11:27 PM  Result Value Ref Range   Alcohol, Ethyl (B) 189 (H) <15 mg/dL    Comment: (NOTE) For medical purposes only. Performed at [facility], [address], [place], [name] [address]   Type and screen [facility]     Status: None (Preliminary result)   Collection Time: 08/05/23 12:05 AM  Result Value Ref Range   ABO/RH(D) A POS    Antibody Screen NEG    Sample Expiration      08/08/2023,2359 Performed at [facility], [address] [place], [name] [address]    Unit Number [id number]    Blood Component Type RED CELLS,LR    Unit division 00    Status of Unit ALLOCATED    Transfusion Status OK TO TRANSFUSE    Crossmatch Result PENDING    Unit Number [id number]    Blood Component Type RBC LR PHER2    Unit division 00    Status  of Unit ALLOCATED    Transfusion Status OK TO TRANSFUSE    Crossmatch Result PENDING   Prepare RBC (crossmatch)     Status: None   Collection Time: 08/05/23 12:05 AM  Result Value Ref Range   Order Confirmation      ORDER PROCESSED BY BLOOD BANK Performed at [facility], [address] [place], [name] [address]   Urinalysis, Routine w reflex microscopic -Urine, Clean Catch     Status: Abnormal   Collection Time: 08/05/23  2:24 AM  Result Value Ref Range   Color, Urine YELLOW YELLOW   APPearance CLEAR CLEAR   Specific Gravity, Urine 1.017 1.005 - 1.030   pH 6.0 5.0 - 8.0   Glucose, UA NEGATIVE NEGATIVE mg/dL   Hgb urine dipstick MODERATE (A) NEGATIVE   Bilirubin Urine NEGATIVE NEGATIVE   Ketones, ur NEGATIVE NEGATIVE mg/dL   Protein, ur NEGATIVE NEGATIVE mg/dL   Nitrite NEGATIVE NEGATIVE   Leukocytes,Ua TRACE (A) NEGATIVE   RBC / HPF 0-5 0 - 5 RBC/hpf   WBC, UA 0-5 0 - 5 WBC/hpf   Bacteria, UA RARE (A) NONE SEEN   Squamous Epithelial / HPF 0-5 0 - 5 /HPF    Comment: Performed at [facility], [address], [place], [name] [address]    Personally reviewed- free air anterior to stomach, large recanalized umbilical vein CT ABDOMEN PELVIS W CONTRAST Result Date: 08/05/2023 CLINICAL DATA:  Acute abdominal pain for 2 days, initial encounter EXAM: CT ABDOMEN AND PELVIS WITH CONTRAST TECHNIQUE: Multidetector CT imaging of the abdomen and pelvis was performed using the standard protocol following bolus administration of intravenous contrast. RADIATION DOSE REDUCTION: This exam was performed according to the departmental dose-optimization program which includes automated exposure control, adjustment of the mA and/or kV according to patient size and/or use of iterative reconstruction technique. CONTRAST:  OMNIPAQUE  IOHEXOL  300 MG/ML  SOLN COMPARISON:  05/27/2023 FINDINGS: Lower chest: Lung bases are free of acute infiltrate or sizable effusion. Hepatobiliary: Diffuse decreased attenuation is noted throughout the liver likely related to underlying cirrhotic change. Mild nodularity is seen. Recanalization of the umbilical vein is noted which subsequently passes through a umbilical hernia and than decompresses via collaterals into the left external iliac vein. Gallbladder is well distended without cholelithiasis. Some mild pericholecystic fluid is noted related to the known perforation. Pancreas: Unremarkable. No pancreatic ductal dilatation or surrounding inflammatory changes. Spleen: Normal in size without focal abnormality. Adrenals/Urinary Tract: Adrenal glands are within normal limits. Kidneys show no renal calculi or obstructive changes. The bladder is well distended. Stomach/Bowel: No obstructive or inflammatory changes of the colon are seen. The appendix again demonstrates appendicoliths distally although no significant changes to suggest acute appendicitis are noted. Small bowel is within normal limits. There remains thickening of the gastric wall similar to that seen on the prior exam. The outpouching of air suspicious for ulceration on  the prior exam now shows free communication through the anterior gastric wall along the posterior aspect of the left lobe of the liver consistent with perforated ulcer. Considerable free air is noted. Free fluid is noted as well related to these changes. Small hiatal hernia is noted. Vascular/Lymphatic: Aortic atherosclerosis. No enlarged abdominal or pelvic lymph nodes. Reproductive: Uterus and bilateral adnexa are unremarkable. Other: Free fluid and free air is noted related to the gastric ulcer perforation. Fat containing umbilical hernia is noted. 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 These changes are stable from the  prior exam. Musculoskeletal: Old left rib fractures are noted posteriorly. No acute bony abnormality is noted. Anterolisthesis of L4 on L5 is again noted. IMPRESSION: Changes consistent with perforated anterior gastric ulcer progressed in the interval from the prior exam. Considerable free air and free fluid is noted related to the perforation. 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 Prominent appendix with single appendicolith within. This is stable in appearance from the prior exam without inflammatory change. Critical Value/emergent results were called by telephone at the time of interpretation on 08/05/2023 at 1:06 am to Dr. DONALD WICKLINE , who verbally acknowledged these results. Electronically Signed   By: Oneil Devonshire M.D.   On: 08/05/2023 01:12   DG Chest Portable 1 View Result Date: 08/04/2023 CLINICAL DATA:  Dyspnea EXAM: PORTABLE CHEST 1 VIEW COMPARISON:  CT 05/12/2023 FINDINGS: The heart size and mediastinal contours are within normal limits. Both lungs are clear. The visualized skeletal structures are unremarkable. IMPRESSION: No active disease. Electronically Signed   By: Dorethia Molt M.D.   On: 08/04/2023 23:50     Assessment and Plan:  Joan Wood is a 53 y.o. female with gastric perforation,  free air, cirrhosis.  She is very tender on exam.   Atia Haupt (daughter) [phone] Ozell Sheller (ex-husband) [phone] [name] Baumgartner (room mate/best friend) [phone]   Patient's next of kin is daughter Duwaine. She refused to let me call her tonight due to her being asleep and having children. She says I can call her after 8AM.   Discussed with patient and Jarratt risk of surgery with Ex lap, gastric repair, omental patch, possible liver biopsy (given history of cirrhosis and GI notes wanting biopsy) discussed risk of bleeding, infection, JP drain placement, cirrhosis decompensation, Risk of mortality 30% with Child Pugh B class. Discussed risk of central line /arterial line placement including injury to vessels, infection, pneumothorax.  Discussed likelihood of ICU placement, potential need to stay intubated and transfer to [facility].  Received NS boluses Zosyn  in ED Hospitalist to assess and determine if appropriate for [facility] admission or [facility] admission   All questions were answered to the satisfaction of the patient and friend/ Jarratt.  Manuelita JAYSON Pander 08/05/2023, 2:52 AM

## 2023-08-05 NOTE — Progress Notes (Addendum)
 eLink Physician-Brief Progress Note Patient Name: Joan Wood DOB: 1970-06-09 MRN: [id number]   Date of Service  08/05/2023  HPI/Events of Note  52/F with history of cirrhosis, GERD, PUD, portal gastropathy,  presenting with abdominal pain. Workup significant for peritoneal free air. She was brought to the OR where she underwent exploratory laparotomy. Pt was found to have a gastric perforation, umbilical hernia with incarcerated omental and recanalized umbilical vein. Gastrorrhaphy with omental patch, umbilical hernia repair, as well as wedge liver biopsy was performed. No complications reported. Pt was extubated post op and was transferred to the ICU for continued management.    eICU Interventions  - Admit to ICU - Serial abdominal exam.  - Will monitor I/Os, drain output - Pain control - Encourage incentive spirometry - Continue PPI.  - Started on empiric Zosyn . Will follow cultures and deescalate as warranted - Trend WBC, lactate, temperature curve - Will follow up liver biopsy results as well - Will also monitor for signs/symptoms of ETOH withdrawal.          [name] M DELA CRUZ 08/05/2023, 5:58 AM

## 2023-08-05 NOTE — Hospital Course (Addendum)
 Joan Wood is a 53 y.o. female with medical history significant of anxiety, GERD, cirrhosis, hypertension who presents emergency department due to abdominal pain.  On arrival she was afebrile and hemodynamically stable, labs showed sodium 122, potassium 3.3, creatinine 0.57, WBC 5.5, hemoglobin 10.4, INR 1.3.  Patient CT abdomen pelvis which showed perforated gastric ulcer.  Surgery was consulted and she was taken to the OR for exploratory laparotomy, omental patch and hernia repair on 08/05/23.  She was admitted to the ICU.  She is on TNA.

## 2023-08-06 ENCOUNTER — Inpatient Hospital Stay (HOSPITAL_COMMUNITY)

## 2023-08-06 ENCOUNTER — Other Ambulatory Visit: Payer: Self-pay

## 2023-08-06 DIAGNOSIS — D649 Anemia, unspecified: Secondary | ICD-10-CM

## 2023-08-06 DIAGNOSIS — F1010 Alcohol abuse, uncomplicated: Secondary | ICD-10-CM | POA: Diagnosis not present

## 2023-08-06 DIAGNOSIS — K7460 Unspecified cirrhosis of liver: Secondary | ICD-10-CM

## 2023-08-06 DIAGNOSIS — R198 Other specified symptoms and signs involving the digestive system and abdomen: Secondary | ICD-10-CM | POA: Diagnosis not present

## 2023-08-06 DIAGNOSIS — Z8711 Personal history of peptic ulcer disease: Secondary | ICD-10-CM | POA: Diagnosis not present

## 2023-08-06 DIAGNOSIS — Z860100 Personal history of colon polyps, unspecified: Secondary | ICD-10-CM | POA: Diagnosis not present

## 2023-08-06 LAB — COMPREHENSIVE METABOLIC PANEL WITH GFR
ALT: 23 U/L (ref 0–44)
AST: 73 U/L — ABNORMAL HIGH (ref 15–41)
Albumin: 2.2 g/dL — ABNORMAL LOW (ref 3.5–5.0)
Alkaline Phosphatase: 42 U/L (ref 38–126)
Anion gap: 5 (ref 5–15)
BUN: 10 mg/dL (ref 6–20)
CO2: 23 mmol/L (ref 22–32)
Calcium: 7.8 mg/dL — ABNORMAL LOW (ref 8.9–10.3)
Chloride: 100 mmol/L (ref 98–111)
Creatinine, Ser: 0.44 mg/dL (ref 0.44–1.00)
GFR, Estimated: >60 mL/min (ref >60)
Glucose, Bld: 116 mg/dL — ABNORMAL HIGH (ref 70–99)
Potassium: 4.2 mmol/L (ref 3.5–5.1)
Sodium: 128 mmol/L — ABNORMAL LOW (ref 135–145)
Total Bilirubin: 1.4 mg/dL — ABNORMAL HIGH (ref 0.0–1.2)
Total Protein: 5.8 g/dL — ABNORMAL LOW (ref 6.5–8.1)

## 2023-08-06 LAB — CBC
HCT: 24.4 % — ABNORMAL LOW (ref 36.0–46.0)
Hemoglobin: 7.8 g/dL — ABNORMAL LOW (ref 12.0–15.0)
MCH: 29.7 pg (ref 26.0–34.0)
MCHC: 32 g/dL (ref 30.0–36.0)
MCV: 92.8 fL (ref 80.0–100.0)
Platelets: 76 K/uL — ABNORMAL LOW (ref 150–400)
RBC: 2.63 MIL/uL — ABNORMAL LOW (ref 3.87–5.11)
RDW: 17.8 % — ABNORMAL HIGH (ref 11.5–15.5)
WBC: 5.4 K/uL (ref 4.0–10.5)
nRBC: 0 % (ref 0.0–0.2)

## 2023-08-06 LAB — IRON AND TIBC
Iron: 8 ug/dL — ABNORMAL LOW (ref 28–170)
Saturation Ratios: 3 % — ABNORMAL LOW (ref 10.4–31.8)
TIBC: 310 ug/dL (ref 250–450)
UIBC: 302 ug/dL

## 2023-08-06 LAB — BASIC METABOLIC PANEL WITH GFR
Anion gap: 9 (ref 5–15)
BUN: 10 mg/dL (ref 6–20)
CO2: 21 mmol/L — ABNORMAL LOW (ref 22–32)
Calcium: 8 mg/dL — ABNORMAL LOW (ref 8.9–10.3)
Chloride: 99 mmol/L (ref 98–111)
Creatinine, Ser: 0.46 mg/dL (ref 0.44–1.00)
GFR, Estimated: >60 mL/min (ref >60)
Glucose, Bld: 125 mg/dL — ABNORMAL HIGH (ref 70–99)
Potassium: 4.4 mmol/L (ref 3.5–5.1)
Sodium: 129 mmol/L — ABNORMAL LOW (ref 135–145)

## 2023-08-06 LAB — PROTIME-INR
INR: 1.5 — ABNORMAL HIGH (ref 0.8–1.2)
Prothrombin Time: 19.2 s — ABNORMAL HIGH (ref 11.4–15.2)

## 2023-08-06 LAB — FOLATE: Folate: 6.5 ng/mL (ref >5.9)

## 2023-08-06 LAB — GLUCOSE, CAPILLARY
Glucose-Capillary: 129 mg/dL — ABNORMAL HIGH (ref 70–99)
Glucose-Capillary: 128 mg/dL — ABNORMAL HIGH (ref 70–99)
Glucose-Capillary: 89 mg/dL (ref 70–99)

## 2023-08-06 LAB — VITAMIN B12: Vitamin B-12: 757 pg/mL (ref 180–914)

## 2023-08-06 LAB — PHOSPHORUS: Phosphorus: 2 mg/dL — ABNORMAL LOW (ref 2.5–4.6)

## 2023-08-06 LAB — MAGNESIUM: Magnesium: 1.7 mg/dL (ref 1.7–2.4)

## 2023-08-06 MED ORDER — DIPHENHYDRAMINE HCL 50 MG/ML IJ SOLN
12.5000 mg | Freq: Four times a day (QID) | INTRAMUSCULAR | Status: DC | PRN
  Administered 2023-08-06 – 2023-08-11 (×2): 12.5 mg via INTRAVENOUS
  Filled 2023-08-06 (×2): qty 1

## 2023-08-06 MED ORDER — SODIUM CHLORIDE 0.9% FLUSH
10.0000 mL | Freq: Two times a day (BID) | INTRAVENOUS | Status: DC
  Administered 2023-08-06 – 2023-08-14 (×16): 10 mL

## 2023-08-06 MED ORDER — INSULIN ASPART 100 UNIT/ML IJ SOLN: 0.0000 [IU] | Freq: Four times a day (QID) | INTRAMUSCULAR | Status: DC

## 2023-08-06 MED ORDER — TRAVASOL 10 % IV SOLN
INTRAVENOUS | Status: AC
  Filled 2023-08-06: qty 336

## 2023-08-06 MED ORDER — KCL-LACTATED RINGERS-D5W 20 MEQ/L IV SOLN
INTRAVENOUS | Status: DC
  Filled 2023-08-06 (×2): qty 1000

## 2023-08-06 MED ORDER — LACTATED RINGERS IV BOLUS
500.0000 mL | Freq: Once | INTRAVENOUS | Status: AC
  Administered 2023-08-06: 500 mL via INTRAVENOUS

## 2023-08-06 MED ORDER — MIDODRINE HCL 5 MG PO TABS
2.5000 mg | ORAL_TABLET | Freq: Three times a day (TID) | ORAL | Status: DC
  Administered 2023-08-06 – 2023-08-07 (×5): 2.5 mg via ORAL
  Filled 2023-08-06 (×5): qty 1

## 2023-08-06 MED ORDER — SODIUM CHLORIDE 0.9% FLUSH: 10.0000 mL | INTRAVENOUS | Status: DC | PRN

## 2023-08-06 MED ORDER — KCL-LACTATED RINGERS-D5W 20 MEQ/L IV SOLN
INTRAVENOUS | Status: AC
  Filled 2023-08-06 (×3): qty 1000

## 2023-08-06 MED ORDER — SODIUM PHOSPHATES 45 MMOLE/15ML IV SOLN
15.0000 mmol | Freq: Once | INTRAVENOUS | Status: AC
  Administered 2023-08-06: 15 mmol via INTRAVENOUS
  Filled 2023-08-06: qty 5

## 2023-08-06 MED ORDER — LORAZEPAM 1 MG PO TABS
1.0000 mg | ORAL_TABLET | ORAL | Status: DC | PRN
  Administered 2023-08-06 – 2023-08-12 (×6): 1 mg via ORAL
  Filled 2023-08-06 (×6): qty 1

## 2023-08-06 MED ORDER — THIAMINE HCL 100 MG/ML IJ SOLN
100.0000 mg | Freq: Once | INTRAMUSCULAR | Status: AC
  Administered 2023-08-06: 100 mg via INTRAVENOUS
  Filled 2023-08-06: qty 2

## 2023-08-06 MED ORDER — DIPHENHYDRAMINE-ZINC ACETATE 2-0.1 % EX CREA
TOPICAL_CREAM | Freq: Three times a day (TID) | CUTANEOUS | Status: DC | PRN
  Filled 2023-08-06: qty 28

## 2023-08-06 NOTE — Plan of Care (Signed)
  Problem: Clinical Measurements: Goal: Diagnostic test results will improve Outcome: Progressing Goal: Respiratory complications will improve Outcome: Progressing Goal: Cardiovascular complication will be avoided Outcome: Progressing   Problem: Elimination: Goal: Will not experience complications related to urinary retention Outcome: Progressing   Problem: Safety: Goal: Ability to remain free from injury will improve Outcome: Progressing   Problem: Skin Integrity: Goal: Risk for impaired skin integrity will decrease Outcome: Progressing

## 2023-08-06 NOTE — Progress Notes (Signed)
   08/06/23 0406  Provider Notification  Provider Name/Title Dr. Dena  Date Provider Notified 08/06/23  Time Provider Notified 0406  Method of Notification Page  Notification Reason Other (Comment) (BP 86/53)  Provider response See new orders

## 2023-08-06 NOTE — Progress Notes (Signed)
 [facility]  Patient without major issues. Still with pain. JP is putting out more serous fluid than would be expected with 700+ out yesterday.  No flatus or BM.  BP 100/66   Pulse 95   Temp 99.2 F (37.3 C) (Oral)   Resp 12   Ht 5' 2 (1.575 m)   Wt 54.4 kg   SpO2 98%   BMI 21.95 kg/m  Soft, appropriately tender, mildly distended, Honeycomb c/d/I JP with serous fluid  Patient with perforated gastric ulcer in the setting of cirrhosis and umbilical hernia. S/p Ex lap, gastrorraphy, omental patch, liver biopsy, primary umbilical hernia repair. Doing fair.  PRN For pain No NSAIDs Npo, NG  Can have ice Ambulate PPI BID GI to see given the JP output, to see if need to treat for ascites to reduce the output Culture too young to read, zosyn  and diflucan  for 5 days post op until 7/31 H&H has drifted down but no signs of active bleeding, monitor  Labs tomorrow Hold any prophylaxis for DVT given the H&H drifting Foley out Just verified with Radiology, no radiologist/ PA here on Thursday, so will have to do her UGI On Friday AM  PICC and TPN for nutrition given extended time

## 2023-08-06 NOTE — Consult Note (Signed)
 Gastroenterology Consult   Referring Provider: Dr. Kallie Primary Care Physician:  Zarwolo, Gloria, FNP Primary Gastroenterologist:  Dr. Cindie  Patient ID: Joan Wood; [id number]; [birth year]   Admit date: 08/04/2023  LOS: 1 day   Date of Consultation: 08/06/2023  Reason for Consultation:  concern for ascites  History of Present Illness   Joan Wood is a 53 y.o. year old female with a history of ETOH abuse, cirrhosis felt likely due to ETOH, GERD, HTN, SVT, periumbilical hernia, chronic diarrhea, GI bleed in May 2025 with findings of PUD on EGD, lost to follow-up and surveillance EGD, presenting to the ED 7/26 with abdominal pain, vomiting, and diarrhea, found to have gastric perforation and underwent exploratory laparotomy with gastrorrhaphy, omental patch, wedge liver biopsy, primary umbilical hernia repair, JP drain placement. GI has now been consulted due to increased JP drainage and concern for ascites.  At bedside: patient notes improved pain from admission. Dealing with post-op pain. No flatus. Nausea and vomiting yesterday but resolved today. She notes it is hard to take in a deep breath. Feels her right side is more tense than left side. JP drain 150 ml total this shift as of 1100. Around 700 ml yesterday. Mainly serous output with small amount of blood tinged. No lower extremity edema. She last drank alcohol on Saturday, 2 beers. She states she has decreased her intake down to 3 beers total a week. No overt GI bleeding. No mental status changes or confusion.   Hgb 7.8 today, down from 10.4 two days ago. Seems to be in the 8/9 range chronically. No overt Gi bleeding. Iron low at 8, sats low at 3.    ENDOSCOPIC evaluations EGD May 2025 while inpatient: LA Grade D esophagitis, portal gastropathy, 15 mm non-bleeding gastric ulcer with clean base s/p biopsy, 3 additional non-bleeding gastric ulcers with largest 4 mm, normal duodenum. Surveillance in 8-10 weeks. NEGATIVE  h.pylori.    REMOTE colonoscopy about 20 years ago with polyps   Past Medical History:  Diagnosis Date   Anxiety    GERD (gastroesophageal reflux disease)    Hernia of abdominal wall    HTN (hypertension)    SVT (supraventricular tachycardia) (HCC)     Past Surgical History:  Procedure Laterality Date   ESOPHAGOGASTRODUODENOSCOPY N/A 05/17/2023   Procedure: EGD (ESOPHAGOGASTRODUODENOSCOPY);  Surgeon: Cindie Carlin POUR, DO;  Location: [facility];  Service: Endoscopy;  Laterality: N/A;   FINGER FRACTURE SURGERY     right ankle repair     age 7    Prior to Admission medications   Medication Sig Start Date End Date Taking? Authorizing Provider  folic acid  (FOLVITE ) 1 MG tablet Take 1 tablet (1 mg total) by mouth daily. 03/29/23  Yes Zarwolo, Gloria, FNP  meloxicam  (MOBIC ) 15 MG tablet Take 15 mg by mouth daily. 08/03/23  Yes [provider]  omeprazole  (PRILOSEC) 40 MG capsule Take 1 capsule (40 mg total) by mouth 2 (two) times daily. 05/29/23  Yes Tat, Alm, MD  sertraline  (ZOLOFT ) 25 MG tablet Take 1 tablet (25 mg total) by mouth daily. 07/30/23  Yes Zarwolo, Gloria, FNP  Vitamin D , Ergocalciferol , (DRISDOL ) 1.25 MG (50000 UNIT) CAPS capsule Take 1 capsule (50,000 Units total) by mouth every 7 (seven) days. 08/04/23  Yes Zarwolo, Gloria, FNP  Elastic Bandages & Supports (FITRITE BACK BRACE WITH PULLEY) MISC 1 Units by Does not apply route daily. 05/25/23   Bevely Doffing, FNP  Misc. Devices MISC Closed toe walking boot for  right foot toe fractures Dx: D07.698 05/25/23   Bevely Doffing, FNP    Current Facility-Administered Medications  Medication Dose Route Frequency Provider Last Rate Last Admin   chlorhexidine  (PERIDEX ) 0.12 % solution 15 mL  15 mL Mouth/Throat Once Shahmehdi, Seyed A, MD       Chlorhexidine  Gluconate Cloth 2 % PADS 6 each  6 each Topical Daily Shahmehdi, Seyed A, MD   6 each at 08/06/23 1024   dextrose 5% in lactated ringers  with KCl 20 mEq/L infusion    Intravenous Continuous Shahmehdi, Seyed A, MD 100 mL/hr at 08/06/23 1102 New Bag at 08/06/23 1102   dextrose 5% in lactated ringers  with KCl 20 mEq/L infusion   Intravenous Continuous Shahmehdi, Seyed A, MD       fluconazole  (DIFLUCAN ) IVPB 400 mg  400 mg Intravenous Q24H Clair Lynwood CROME, RPH 100 mL/hr at 08/06/23 1107 400 mg at 08/06/23 1107   folic acid  (FOLVITE ) tablet 1 mg  1 mg Oral Daily Shahmehdi, Seyed A, MD   1 mg at 08/06/23 0923   [START ON 08/07/2023] insulin  aspart (novoLOG ) injection 0-9 Units  0-9 Units Subcutaneous Q6H Shahmehdi, Seyed A, MD       LORazepam  (ATIVAN ) 2 MG/ML concentrated solution 1 mg  1 mg Oral Q4H PRN Shahmehdi, Seyed A, MD       methocarbamol  (ROBAXIN ) injection 500 mg  500 mg Intravenous Q8H PRN Shahmehdi, Seyed A, MD   500 mg at 08/05/23 1351   midodrine  (PROAMATINE ) tablet 2.5 mg  2.5 mg Oral TID WC Shahmehdi, Seyed A, MD   2.5 mg at 08/06/23 1202   morphine  (PF) 2 MG/ML injection 2-4 mg  2-4 mg Intravenous Q2H PRN Kallie Manuelita BROCKS, MD   2 mg at 08/06/23 1200   nicotine  (NICODERM CQ  - dosed in mg/24 hours) patch 14 mg  14 mg Transdermal Daily Shahmehdi, Seyed A, MD       ondansetron  (ZOFRAN ) injection 4 mg  4 mg Intravenous Q6H PRN Kallie Manuelita BROCKS, MD   4 mg at 08/05/23 1644   Oral care mouth rinse  15 mL Mouth Rinse PRN Shahmehdi, Seyed A, MD       pantoprazole  (PROTONIX ) injection 40 mg  40 mg Intravenous Q12H Kallie Manuelita BROCKS, MD   40 mg at 08/06/23 0917   phenol (CHLORASEPTIC) mouth spray 1 spray  1 spray Mouth/Throat PRN Kallie Manuelita BROCKS, MD   1 spray at 08/05/23 1030   piperacillin -tazobactam (ZOSYN ) IVPB 3.375 g  3.375 g Intravenous Q8H Ledford, James L, RPH 12.5 mL/hr at 08/06/23 1425 3.375 g at 08/06/23 1425   sertraline  (ZOLOFT ) tablet 25 mg  25 mg Oral Daily Dorrell, Lamar, MD   25 mg at 08/06/23 9076   sodium phosphate  15 mmol in sodium chloride  0.9 % 250 mL infusion  15 mmol Intravenous Once Shahmehdi, Seyed A, MD       sucralfate   (CARAFATE ) tablet 1 g  1 g Oral TID WC & HS Shahmehdi, Seyed A, MD   1 g at 08/06/23 1205   thiamine  (VITAMIN B1) injection 100 mg  100 mg Intravenous Once Shahmehdi, Seyed A, MD       TPN ADULT (ION)   Intravenous Continuous TPN Kallie Manuelita BROCKS, MD        Allergies as of 08/04/2023 - Review Complete 08/04/2023  Allergen Reaction Noted   Bee venom Anaphylaxis 08/14/2020   Latex Rash 05/11/2023    Family History  Problem Relation Age of Onset  Cancer Mother    Leukemia Mother    Multiple myeloma Mother    Cancer Father        unsure what kind   Cancer - Colon Neg Hx    Colon polyps Neg Hx     Social History   Socioeconomic History   Marital status: Single    Spouse name: Not on file   Number of children: Not on file   Years of education: Not on file   Highest education level: Not on file  Occupational History   Not on file  Tobacco Use   Smoking status: Every Day    Current packs/day: 0.50    Average packs/day: 0.7 packs/day for 38.4 years (28.5 ttl pk-yrs)    Types: Cigarettes    Start date: 03/2022   Smokeless tobacco: Never  Vaping Use   Vaping status: Never Used  Substance and Sexual Activity   Alcohol use: Yes    Comment: beer, 4-6 /day   Drug use: Never   Sexual activity: Not Currently  Other Topics Concern   Not on file  Social History Narrative   Not on file   Social Drivers of Health   Financial Resource Strain: Not on file  Food Insecurity: No Food Insecurity (08/05/2023)   Hunger Vital Sign    Worried About Running Out of Food in the Last Year: Never true    Ran Out of Food in the Last Year: Never true  Transportation Needs: No Transportation Needs (08/05/2023)   PRAPARE - Administrator, Civil Service (Medical): No    Lack of Transportation (Non-Medical): No  Physical Activity: Not on file  Stress: Not on file  Social Connections: Not on file  Intimate Partner Violence: Not At Risk (08/05/2023)   Humiliation, Afraid, Rape, and  Kick questionnaire    Fear of Current or Ex-Partner: No    Emotionally Abused: No    Physically Abused: No    Sexually Abused: No     Review of Systems   Gen: Denies any fever, chills, loss of appetite, change in weight or weight loss CV: Denies chest pain, heart palpitations, syncope, edema  Resp: Denies shortness of breath with rest, cough, wheezing, coughing up blood, and pleurisy. GI: see HPI GU : Denies urinary burning, blood in urine, urinary frequency, and urinary incontinence. MS: Denies joint pain, limitation of movement, swelling, cramps, and atrophy.  Derm: Denies rash, itching, dry skin, hives. Psych: Denies depression, anxiety, memory loss, hallucinations, and confusion. Heme: Denies bruising or bleeding Neuro:  Denies any headaches, dizziness, paresthesias, shaking  Physical Exam   Vital Signs in last 24 hours: Temp:  [97.9 F (36.6 C)-99.2 F (37.3 C)] 99.2 F (37.3 C) (07/28 0714) Pulse Rate:  [86-108] 96 (07/28 1300) Resp:  [7-20] 10 (07/28 1300) BP: (86-125)/(46-79) 112/67 (07/28 1200) SpO2:  [94 %-100 %] 97 % (07/28 1300)    General:   Alert, no distress, chronically ill-appearing Head:  Normocephalic and atraumatic. Eyes:  Sclera clear, no icterus.    Ears:  Normal auditory acuity. Lungs:  scattered rhonchi Heart:  S1 S2 present no murmurs Abdomen:  Soft, TTP right-sided abdomen and more tense than left-sided abdomen. Abdominal pain in place with JP drain. Serous fluid with twinges of blood noted. +BS  Rectal: deferred   Msk:  Symmetrical without gross deformities. Normal posture. Extremities:  Without  edema. Neurologic:  Alert and  oriented x4. No asterixis Skin:  Intact without significant lesions or rashes. Psych:  Alert and cooperative. Normal mood and affect.  Intake/Output from previous day: 07/27 0701 - 07/28 0700 In: 3401 [I.V.:2031.3; NG/GT:140; IV Piggyback:1229.6] Out: 2845 [Urine:825; Emesis/NG output:1250; Drains:770] Intake/Output  this shift: Total I/O In: 444.3 [I.V.:402.7; IV Piggyback:41.6] Out: 150 [Drains:150]   Labs/Studies   Recent Labs Recent Labs    08/04/23 2319 08/06/23 0419  WBC 5.5 5.4  HGB 10.4* 7.8*  HCT 31.2* 24.4*  PLT 123* 76*   BMET Recent Labs    08/05/23 1751 08/06/23 0034 08/06/23 0419  NA 127* 129* 128*  K 4.3 4.4 4.2  CL 100 99 100  CO2 18* 21* 23  GLUCOSE 132* 125* 116*  BUN 9 10 10   CREATININE 0.55 0.46 0.44  CALCIUM  7.7* 8.0* 7.8*   LFT Recent Labs    08/04/23 2319 08/06/23 0419  PROT 7.7 5.8*  ALBUMIN 3.1* 2.2*  AST 116* 73*  ALT 33 23  ALKPHOS 86 42  BILITOT 1.7* 1.4*   PT/INR Recent Labs    08/04/23 2327 08/06/23 1201  LABPROT 16.6* 19.2*  INR 1.3* 1.5*     Radiology/Studies US  EKG SITE RITE Result Date: 08/06/2023 If Site Rite image not attached, placement could not be confirmed due to current cardiac rhythm.  DG Abd 1 View Result Date: 08/05/2023 CLINICAL DATA:  NG tube placement. EXAM: ABDOMEN - 1 VIEW COMPARISON:  None Available. FINDINGS: NG tube tip is in the region of the gastric fundus. Side port of the NG tube is well below the GE junction. Mild gaseous distention of the stomach evident. Surgical drain overlies the midline upper abdomen. IMPRESSION: NG tube tip is in the region of the gastric fundus. Electronically Signed   By: Camellia Candle M.D.   On: 08/05/2023 06:43   CT ABDOMEN PELVIS W CONTRAST Result Date: 08/05/2023 CLINICAL DATA:  Acute abdominal pain for 2 days, initial encounter EXAM: CT ABDOMEN AND PELVIS WITH CONTRAST TECHNIQUE: Multidetector CT imaging of the abdomen and pelvis was performed using the standard protocol following bolus administration of intravenous contrast. RADIATION DOSE REDUCTION: This exam was performed according to the departmental dose-optimization program which includes automated exposure control, adjustment of the mA and/or kV according to patient size and/or use of iterative reconstruction technique.  CONTRAST:  OMNIPAQUE  IOHEXOL  300 MG/ML  SOLN COMPARISON:  05/27/2023 FINDINGS: Lower chest: Lung bases are free of acute infiltrate or sizable effusion. Hepatobiliary: Diffuse decreased attenuation is noted throughout the liver likely related to underlying cirrhotic change. Mild nodularity is seen. Recanalization of the umbilical vein is noted which subsequently passes through a umbilical hernia and than decompresses via collaterals into the left external iliac vein. Gallbladder is well distended without cholelithiasis. Some mild pericholecystic fluid is noted related to the known perforation. Pancreas: Unremarkable. No pancreatic ductal dilatation or surrounding inflammatory changes. Spleen: Normal in size without focal abnormality. Adrenals/Urinary Tract: Adrenal glands are within normal limits. Kidneys show no renal calculi or obstructive changes. The bladder is well distended. Stomach/Bowel: No obstructive or inflammatory changes of the colon are seen. The appendix again demonstrates appendicoliths distally although no significant changes to suggest acute appendicitis are noted. Small bowel is within normal limits. There remains thickening of the gastric wall similar to that seen on the prior exam. The outpouching of air suspicious for ulceration on the prior exam now shows free communication through the anterior gastric wall along the posterior aspect of the left lobe of the liver consistent with perforated ulcer. Considerable free air is noted. Free fluid is noted  as well related to these changes. Small hiatal hernia is noted. Vascular/Lymphatic: Aortic atherosclerosis. No enlarged abdominal or pelvic lymph nodes. Reproductive: Uterus and bilateral adnexa are unremarkable. Other: Free fluid and free air is noted related to the gastric ulcer perforation. Fat containing umbilical hernia is noted. 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 These changes are  stable from the prior exam. Musculoskeletal: Old left rib fractures are noted posteriorly. No acute bony abnormality is noted. Anterolisthesis of L4 on L5 is again noted. IMPRESSION: Changes consistent with perforated anterior gastric ulcer progressed in the interval from the prior exam. Considerable free air and free fluid is noted related to the perforation. 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 Prominent appendix with single appendicolith within. This is stable in appearance from the prior exam without inflammatory change. Critical Value/emergent results were called by telephone at the time of interpretation on 08/05/2023 at 1:06 am to Dr. DONALD WICKLINE , who verbally acknowledged these results. Electronically Signed   By: Oneil Devonshire M.D.   On: 08/05/2023 01:12   DG Chest Portable 1 View Result Date: 08/04/2023 CLINICAL DATA:  Dyspnea EXAM: PORTABLE CHEST 1 VIEW COMPARISON:  CT 05/12/2023 FINDINGS: The heart size and mediastinal contours are within normal limits. Both lungs are clear. The visualized skeletal structures are unremarkable. IMPRESSION: No active disease. Electronically Signed   By: Dorethia Molt M.D.   On: 08/04/2023 23:50     Assessment   RONNI OSTERBERG is a 53 y.o. year old female  with a history of ETOH abuse, cirrhosis felt likely due to ETOH, GERD, HTN, SVT, periumbilical hernia, chronic diarrhea, GI bleed in May 2025 with findings of PUD on EGD, lost to follow-up and surveillance EGD, presenting to the ED 7/26 with abdominal pain, vomiting, and diarrhea, found to have gastric perforation and underwent exploratory laparotomy with gastrorrhaphy, omental patch, wedge liver biopsy, primary umbilical hernia repair, JP drain placement. GI has now been consulted due to increased JP drainage and concern for ascites.  Cirrhosis: MELD 3.0 is 22 today. Physical exam without obvious tense ascites but does have firmer right-sided abdomen compared to left; suspect  JP drain output contains some ascites. Will order limited US  to further evaluate. May need para as JP drain will need to be removed at some point. For now, clinically without SBP. If para needed, recommend fluid analysis.   Anemia: Hgb 7.8 today, down from 10.4 two days ago. Seems to be in the 8/9 range chronically. No overt GI bleeding. Iron low at 8, sats low at 3. Anemia multifactorial with known hx of IDA in setting of PUD, chronic disease, etc. Continue with PPI BID.   Possible liver lesion: on CT in 2022 and also earlier this year was indeterminate. Normal AFP April 2025. Can follow as outpatient.   Hx of polyps: about 20 years ago. Will need surveillance as outpatient in an elective setting once over acute illness.      Plan / Recommendations    US  limited to assess ascites May need para this admission Follow H/H IV PPI BID Daily MELD labs Absolute ETOH cessation. Hx of elevated IgG in setting of alcohol use; recheck this as outpatient.  Outpatient MRI due to indeterminate liver lesion Outpatient colonoscopy once over acute illness      08/06/2023, 2:58 PM  Joan MICAEL Stager, PhD, ANP-BC [facility]

## 2023-08-06 NOTE — Progress Notes (Signed)
 Initial Nutrition Assessment  DOCUMENTATION CODES:   Non-severe (moderate) malnutrition in context of acute illness/injury  INTERVENTION:   TPN to meet 100% of estimated nutrition needs as able.  Monitor magnesium , potassium, and phosphorus for at least 3 days, MD to replete as needed, as pt is at risk for refeeding syndrome given malnutrition, minimal intake for the past week, and hx ETOH abuse.  Add Thiamine  100 mg daily for 7 days.  NUTRITION DIAGNOSIS:   Moderate Malnutrition related to acute illness as evidenced by energy intake < 75% for > 7 days, mild muscle depletion, percent weight loss (1.6% weight loss x 1 week).  GOAL:   Patient will meet greater than or equal to 90% of their needs  MONITOR:   I & O's, Diet advancement  REASON FOR ASSESSMENT:   Consult New TPN/TNA  ASSESSMENT:   53 yo female admitted with perforated gastric ulcer in the setting of cirrhosis and umbilical hernia. PMH includes SVT, GERD, HTN, abdominal wall hernia, anxiety, chronic alcohol use, liver cirrhosis.  7/27: S/P ex lap, gastrorrhaphy, omental patch, liver biopsy, and umbilical hernia repair.  Patient reports that she has been unable to keep anything down since last Wednesday. Her abdominal pain was really bad on admission, but is slowly improving. Prior to last Wednesday she was eating okay, but not as well as she used to. Over the past 6 months, her intake has been poor d/t abdominal pain and decreased appetite. She eats well sometimes and poorly other times. She used to drink beer heavily (since age 68), but for the past 6 months, she has only been drinking 2-3 beers per week, only on the weekends. She does not drink any other type of alcohol. She endorses weight loss, but unsure how much.  Currently NPO except ice chips and sips with medications.   NG tube to LIS: 1250 ml output x 24 hours Emesis x 3 x 24 hours JP drain in R abdomen: 770 ml output x 24 hours; 150 ml output documented so  far today.  Plans to begin TPN today after PICC placement.  TPN at 40 ml/h will provide 816 kcal and 34 gm protein.  Labs reviewed. Na 128 (L), phos 2 (L) CBG: 129-128  Medications reviewed and include folic acid , novolog , protonix , sucralfate . IVF: D5 LR with KCl 20 mEq/L currently at 100 ml/h, decreasing to 60 ml/h when TPN starts.   Weight history reviewed.  1.6% weight loss within the past 5 days. Weight has been stable for the past 14 months.  Patient meets criteria for moderate malnutrition, given mild depletion of muscle mass, intake < 75% for > 7 days, and 1.6% weight loss within the past week.  NUTRITION - FOCUSED PHYSICAL EXAM:  Flowsheet Row Most Recent Value  Orbital Region No depletion  Upper Arm Region Moderate depletion  Thoracic and Lumbar Region No depletion  Buccal Region No depletion  Temple Region Mild depletion  Clavicle Bone Region Mild depletion  Clavicle and Acromion Bone Region Mild depletion  Scapular Bone Region Unable to assess  Dorsal Hand Mild depletion  Patellar Region Mild depletion  Anterior Thigh Region Mild depletion  Posterior Calf Region Severe depletion  Edema (RD Assessment) None  Hair Reviewed  Eyes Reviewed  Mouth Reviewed  Skin Reviewed  Nails Reviewed    Diet Order:   Diet Order             Diet NPO time specified Except for: Ice Chips, Sips with Meds  Diet effective  now                   EDUCATION NEEDS:   No education needs have been identified at this time  Skin:  Skin Assessment: Skin Integrity Issues: Skin Integrity Issues:: Incisions Incisions: surgical incision to abdomen  Last BM:  no BM documented  Height:   Ht Readings from Last 1 Encounters:  08/04/23 5' 2 (1.575 m)    Weight:   Wt Readings from Last 1 Encounters:  08/04/23 54.4 kg    Ideal Body Weight:  50 kg  BMI:  Body mass index is 21.95 kg/m.  Estimated Nutritional Needs:   Kcal:  1600-1800  Protein:  75-90 gm  Fluid:   1.6-1.8 L   Suzen HUNT RD, LDN, CNSC Contact via secure chat. If unavailable, use group chat RD Inpatient.

## 2023-08-06 NOTE — Progress Notes (Addendum)
 PHARMACY - TOTAL PARENTERAL NUTRITION CONSULT NOTE   Indication: bowel perforation  Patient Measurements: Height: 5' 2 (157.5 cm) Weight: 54.4 kg (120 lb) IBW/kg (Calculated) : 50.1 TPN AdjBW (KG): 54.4 Body mass index is 21.95 kg/m.  Assessment:   Glucose / Insulin : 116-132 Electrolytes: Na 128 Phos 2.0 Renal: WNL Hepatic: Tbili 1.4   Albumin 2.2 Intake / Output; MIVF: D5LR w/ KCl @ 100 mL/hr GI Imaging/ Procedures:   Central access: PICC placement pending- ordered 7/28 TPN start date: 7/28  Nutritional Goals: Pending goals  RD Assessment: pending  Current Nutrition:  NPO  Plan:   Sodium phosphate  15 mmol x 1  Start TPN at 40 mL/hr at 1800 Electrolytes in TPN: Na 39mEq/L, K 58mEq/L, Ca 59mEq/L, Mg 62mEq/L, and Phos 15mmol/L. Cl:Ac 1:1 Add standard MVI and trace elements to TPN Initiate Sensitive q6h SSI and adjust as needed  Reduce MIVF to 60 mL/hr at 1800 Monitor TPN labs on Mon/Thurs,   Elspeth Sour, PharmD Clinical Pharmacist 08/06/2023 12:09 PM

## 2023-08-06 NOTE — Progress Notes (Signed)
 1 Day Post-Op  Subjective: Joan Wood is a 53 y/o female #POD1 s/p exploratory laparotomy, gastrorrhaphy, omental patch, wedge liver biopsy, and primary repair of umbilical hernia for gastric perforation with free air, abdominal distension, and peritonitis. She reports feeling generally well today and reports that she feels a lot better than yesterday. She had an episode of prolong hypotension overnight, which was treated with IV fluid bolus. She denies nausea or vomiting this morning. She reports developing an appetite and was able to sit up in a chair briefly yesterday. The patient also reports difficulty sleeping, estimating only about 3 hours of sleep. She also reports throat pain, which she attributes to intubation. She reports pleuritic pain. She is urinating w/o issue but has not passed flatus or had a bowel movement since surgery. She endorses some lower abdominal discomfort, with LLQ and RLQ pain, as well as pain around the incision sites.   Objective: Vital signs in last 24 hours: Temp:  [97.9 F (36.6 C)-99.2 F (37.3 C)] 99.2 F (37.3 C) (07/28 0714) Pulse Rate:  [86-108] 97 (07/28 0800) Resp:  [7-20] 12 (07/28 0800) BP: (86-125)/(46-87) 125/79 (07/28 0900) SpO2:  [94 %-100 %] 99 % (07/28 0900)    Intake/Output from previous day: 07/27 0701 - 07/28 0700 In: 3401 [I.V.:2031.3; NG/GT:140; IV Piggyback:1229.6] Out: 2845 [Urine:825; Emesis/NG output:1250; Drains:770] Intake/Output this shift: No intake/output data recorded.  Physical Exam Constitutional:      General: She is not in acute distress.    Appearance: She is well-developed. She is not ill-appearing or toxic-appearing.  HENT:     Head: Normocephalic.     Comments: NG tube is appropriately positioned and working well.  Cardiovascular:     Rate and Rhythm: Regular rhythm. Tachycardia present.     Heart sounds: Normal heart sounds. No murmur heard.    No friction rub. No gallop.  Pulmonary:     Effort:  Pulmonary effort is normal. No respiratory distress.     Breath sounds: Normal breath sounds. No wheezing or rales.  Abdominal:     General: Abdomen is flat. Bowel sounds are decreased. There is no distension.     Palpations: Abdomen is soft.     Tenderness: There is abdominal tenderness in the right lower quadrant, periumbilical area and left lower quadrant.     Hernia: No hernia is present.     Comments: Surgical incision appears clean and well-approximated, with minimal serosanguinous drainage and some dried blood noted. No signs of infection or dehiscence. JP drain functioning appropriately w/o evidence of blockage or abnormal output.   Skin:    General: Skin is warm and dry.     Coloration: Skin is not cyanotic.  Neurological:     Mental Status: She is alert.  Psychiatric:        Mood and Affect: Mood normal.        Behavior: Behavior normal.    Lab Results:  Recent Labs    08/04/23 2319 08/06/23 0419  WBC 5.5 5.4  HGB 10.4* 7.8*  HCT 31.2* 24.4*  PLT 123* 76*   BMET Recent Labs    08/06/23 0034 08/06/23 0419  NA 129* 128*  K 4.4 4.2  CL 99 100  CO2 21* 23  GLUCOSE 125* 116*  BUN 10 10  CREATININE 0.46 0.44  CALCIUM  8.0* 7.8*   PT/INR Recent Labs    08/04/23 2327  LABPROT 16.6*  INR 1.3*    Studies/Results: DG Abd 1 View Result Date: 08/05/2023 CLINICAL  DATA:  NG tube placement. EXAM: ABDOMEN - 1 VIEW COMPARISON:  None Available. FINDINGS: NG tube tip is in the region of the gastric fundus. Side port of the NG tube is well below the GE junction. Mild gaseous distention of the stomach evident. Surgical drain overlies the midline upper abdomen. IMPRESSION: NG tube tip is in the region of the gastric fundus. Electronically Signed   By: Camellia Candle M.D.   On: 08/05/2023 06:43   CT ABDOMEN PELVIS W CONTRAST Result Date: 08/05/2023 CLINICAL DATA:  Acute abdominal pain for 2 days, initial encounter EXAM: CT ABDOMEN AND PELVIS WITH CONTRAST TECHNIQUE:  Multidetector CT imaging of the abdomen and pelvis was performed using the standard protocol following bolus administration of intravenous contrast. RADIATION DOSE REDUCTION: This exam was performed according to the departmental dose-optimization program which includes automated exposure control, adjustment of the mA and/or kV according to patient size and/or use of iterative reconstruction technique. CONTRAST:  OMNIPAQUE  IOHEXOL  300 MG/ML  SOLN COMPARISON:  05/27/2023 FINDINGS: Lower chest: Lung bases are free of acute infiltrate or sizable effusion. Hepatobiliary: Diffuse decreased attenuation is noted throughout the liver likely related to underlying cirrhotic change. Mild nodularity is seen. Recanalization of the umbilical vein is noted which subsequently passes through a umbilical hernia and than decompresses via collaterals into the left external iliac vein. Gallbladder is well distended without cholelithiasis. Some mild pericholecystic fluid is noted related to the known perforation. Pancreas: Unremarkable. No pancreatic ductal dilatation or surrounding inflammatory changes. Spleen: Normal in size without focal abnormality. Adrenals/Urinary Tract: Adrenal glands are within normal limits. Kidneys show no renal calculi or obstructive changes. The bladder is well distended. Stomach/Bowel: No obstructive or inflammatory changes of the colon are seen. The appendix again demonstrates appendicoliths distally although no significant changes to suggest acute appendicitis are noted. Small bowel is within normal limits. There remains thickening of the gastric wall similar to that seen on the prior exam. The outpouching of air suspicious for ulceration on the prior exam now shows free communication through the anterior gastric wall along the posterior aspect of the left lobe of the liver consistent with perforated ulcer. Considerable free air is noted. Free fluid is noted as well related to these changes. Small  hiatal hernia is noted. Vascular/Lymphatic: Aortic atherosclerosis. No enlarged abdominal or pelvic lymph nodes. Reproductive: Uterus and bilateral adnexa are unremarkable. Other: Free fluid and free air is noted related to the gastric ulcer perforation. Fat containing umbilical hernia is noted. 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 These changes are stable from the prior exam. Musculoskeletal: Old left rib fractures are noted posteriorly. No acute bony abnormality is noted. Anterolisthesis of L4 on L5 is again noted. IMPRESSION: Changes consistent with perforated anterior gastric ulcer progressed in the interval from the prior exam. Considerable free air and free fluid is noted related to the perforation. 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 Prominent appendix with single appendicolith within. This is stable in appearance from the prior exam without inflammatory change. Critical Value/emergent results were called by telephone at the time of interpretation on 08/05/2023 at 1:06 am to Dr. DONALD WICKLINE , who verbally acknowledged these results. Electronically Signed   By: Oneil Devonshire M.D.   On: 08/05/2023 01:12   DG Chest Portable 1 View Result Date: 08/04/2023 CLINICAL DATA:  Dyspnea EXAM: PORTABLE CHEST 1 VIEW COMPARISON:  CT 05/12/2023 FINDINGS: The heart size and mediastinal contours are within  normal limits. Both lungs are clear. The visualized skeletal structures are unremarkable. IMPRESSION: No active disease. Electronically Signed   By: Dorethia Molt M.D.   On: 08/04/2023 23:50    Anti-infectives: Anti-infectives (From admission, onward)    Start     Dose/Rate Route Frequency Ordered Stop   08/05/23 1000  piperacillin -tazobactam (ZOSYN ) IVPB 3.375 g        3.375 g 12.5 mL/hr over 240 Minutes Intravenous Every 8 hours 08/05/23 0531 08/10/23 1359   08/05/23 0630  fluconazole  (DIFLUCAN ) IVPB 400 mg        400 mg 100  mL/hr over 120 Minutes Intravenous Every 24 hours 08/05/23 0533 08/10/23 1059   08/05/23 0600  cefoTEtan  (CEFOTAN ) 2 g in sodium chloride  0.9 % 100 mL IVPB        2 g 200 mL/hr over 30 Minutes Intravenous On call to O.R. 08/05/23 0138 08/05/23 0427   08/05/23 0253  sodium chloride  0.9 % with cefoTEtan  (CEFOTAN ) ADS Med       Note to Pharmacy: Joshua Planas S: cabinet override      08/05/23 0253 08/05/23 0359   08/05/23 0115  piperacillin -tazobactam (ZOSYN ) IVPB 3.375 g        3.375 g 100 mL/hr over 30 Minutes Intravenous  Once 08/05/23 0112 08/05/23 0155      Assessment/Plan: s/p Procedure(s): GASTRORRHAPHY LAPAROTOMY, EXPLORATORY BIOPSY, LIVER REPAIR, HERNIA, UMBILICAL, ADULT  Assessment: Joan Wood is a 53 y/o female  #POD1 s/p exploratory laparotomy, gastrorrhaphy, omental patch, wedge liver biopsy, and primary repair of umbilical hernia for gastric perforation, currently recovering well with improving Sx. She had a transient episode of hypotension overnight, now resolved with fluid resuscitation. She remains NPO w/ sips of water and ice chips for meds, with NG tube and JP drain in place. She indorses incisional pain, no flatus or bowel movement yet, mild throat discomfort post-intubation, and early return of appetite.   Plan: - Continue NPO, NG tube, and ice chips and sips of water with medications. - Continue pain medications PRN. - Avoid the use of NSAIDs. - Continue to promote ambulation off suction. - Continue PPI BID. - Continue Zosyn  and Diflucan  until 7/31 (per the STOP it trial). - Continue to trend labs to monitor hemodynamic stability. - Upper GI Series planned for Thursday as long as patient continues to progress and heal appropriately, and as long as radiology is available to conduct the procedure.  - Continue to hold prophylaxis for now given cirrhosis and recent surgery.  - Continue the use of SCDs to promote circulation and prevent blood loss.    LOS: 1 day     Dwane GORMAN Glatter 08/06/2023

## 2023-08-06 NOTE — Progress Notes (Signed)
 Peripherally Inserted Central Catheter Placement  The IV Nurse has discussed with the patient and/or persons authorized to consent for the patient, the purpose of this procedure and the potential benefits and risks involved with this procedure.  The benefits include less needle sticks, lab draws from the catheter, and the patient may be discharged home with the catheter. Risks include, but not limited to, infection, bleeding, blood clot (thrombus formation), and puncture of an artery; nerve damage and irregular heartbeat and possibility to perform a PICC exchange if needed/ordered by physician.  Alternatives to this procedure were also discussed.  Bard Power PICC patient education guide, fact sheet on infection prevention and patient information card has been provided to patient /or left at bedside.    PICC Placement Documentation  PICC Double Lumen 08/06/23 Right Basilic 34 cm 0 cm (Active)  Indication for Insertion or Continuance of Line Administration of hyperosmolar/irritating solutions (i.e. TPN, Vancomycin, etc.) 08/06/23 1716  Exposed Catheter (cm) 0 cm 08/06/23 1716  Site Assessment Clean, Dry, Intact 08/06/23 1716  Lumen #1 Status Flushed;Saline locked;Blood return noted 08/06/23 1716  Lumen #2 Status Flushed;Saline locked;Blood return noted 08/06/23 1716  Dressing Type Transparent;Securing device 08/06/23 1716  Dressing Status Antimicrobial disc/dressing in place;Clean, Dry, Intact 08/06/23 1716  Line Care Connections checked and tightened 08/06/23 1716  Line Adjustment (NICU/IV Team Only) No 08/06/23 1716  Dressing Intervention New dressing;Adhesive placed at insertion site (IV team only) 08/06/23 1716  Dressing Change Due 08/10/23 08/06/23 1716       Jeffry Vogelsang, Cherene Place 08/06/2023, 5:17 PM

## 2023-08-06 NOTE — Plan of Care (Signed)
  Problem: Clinical Measurements: Goal: Ability to maintain clinical measurements within normal limits will improve Outcome: Progressing Goal: Will remain free from infection Outcome: Progressing Goal: Diagnostic test results will improve Outcome: Progressing Goal: Respiratory complications will improve Outcome: Progressing   Problem: Nutrition: Goal: Adequate nutrition will be maintained Outcome: Progressing   Problem: Coping: Goal: Level of anxiety will decrease Outcome: Progressing   Problem: Pain Managment: Goal: General experience of comfort will improve and/or be controlled Outcome: Progressing   Problem: Safety: Goal: Ability to remain free from injury will improve Outcome: Progressing   Problem: Skin Integrity: Goal: Risk for impaired skin integrity will decrease Outcome: Progressing

## 2023-08-06 NOTE — Progress Notes (Signed)
 PROGRESS NOTE    Patient: Joan Wood                            PCP: Zarwolo, Gloria, FNP                    DOB: Aug 12, 1970            DOA: 08/04/2023 FMW:[id number]             DOS: 08/06/2023, 10:51 AM   LOS: 1 day   Date of Service: The patient was seen and examined on 08/06/2023  Subjective:   Postop day #1 Status post laparoscopic exploratory laparotomy for gastric perforation.  Omental patch, liver biopsy, umbilical hernia repair The patient was seen and examined this morning, still complaining of abdominal pain but improved with analgesics, reporting no gas or bowel movement yet, JP drain inspected, draining well Abdominal wall dressing in place, negative, erythema or edema NG tube in place Patient is awake alert oriented x 3   Brief Narrative:   Joan Wood is a 53 y.o. female with medical history significant of anxiety, GERD, cirrhosis, hypertension who presents emergency department due to abdominal pain.   On arrival she was afebrile and hemodynamically stable, labs showed sodium 122, potassium 3.3, creatinine 0.57, WBC 5.5, hemoglobin 10.4, INR 1.3.  Patient CT abdomen pelvis which showed perforated gastric ulcer.  Surgery was consulted and she was taken to the OR for exploratory laparotomy, omental patch and hernia repair.   She was admitted to the ICU.  Procedure without complication.   Assessment/Plan Principal Problem:   Perforated abdominal viscus Active Problems:   Perforated ulcer (HCC)   Incarcerated umbilical hernia   Alcoholic cirrhosis (HCC)   Pneumoperitoneum   Perforated gastric ulcer Gastric ulcer perforation with liver cirrhosis, umbilical hernia with incarcerated omental and recanalized umbilical vein  Postop day #1 -S/p exploratory laparotomy: gastrorrhaphy, omental patch, liver biopsy, primary umbilical hernia repair completed    - Hemodynamically stable -NG tube in place-draining -Continue ice chips for comfort - JP drain in  place  -Zosyn , and diflucan  -Protonix  40 mg BID - IV muscle relaxants as needed  Foley catheter, postop-removed Hold anticoagulation at this time, will reassess for prophylaxis -if okay with general surgery  - Evaluated for PICC line as per surgery is to be n.p.o. till Thursday Possible TPN i   Hypotensive  - Postop blood pressure remained low-currently hypotensive, asymptomatic - Started p.o. midodrine  - Withholding vasopressors for now   Decompensated alcoholic cirrhosis -with ascites -trend LFTs closely monitor for peritonitis -No signs of alcohol withdrawal -Patient does not want her family to know about her diagnosis alcoholic liver cirrhosis -GI consult for evaluation   Hyponatremia -Serum sodium: 1.7, 129, 28 -Secondary to liver cirrhosis with chronic alcohol abuse likely related to alcohol intake.   Will closely monitor in postoperative setting -SIADH workup -Continue IVF  Acute on chronic anemia -Hemoglobin dropping from 10.4 >> 7.8 -Monitor closely, no signs of bleeding -Obtaining iron studies, B12, folate -Will transfuse as needed -GI has been consulted for liver cirrhosis and ascites appreciate input regarding anemia     ----------------------------------------------------------------------------------------------------------------------------------------------- Nutritional status:  The patient's BMI is: Body mass index is 21.95 kg/m. I agree with the assessment and plan as outlined -----------------------------------------------------------------------------------------------------------------------------------------  DVT prophylaxis:  SCD's Start: 08/05/23 0521   Code Status:   Code Status: Full Code  Family Communication: No family member present at bedside-  Patient requesting her ex-husband to be kept updated-does not want her children to be notified or noted of her medical condition including liver cirrhosis  -Advance care planning has been  discussed.   Admission status:   Status is: Inpatient Remains inpatient appropriate because: S/p laparoscopic abdominal surgery n.p.o. needing IV fluid IV antibiotics,   Disposition: From  - home             Planning for discharge in >3 days   Procedures:   No admission procedures for hospital encounter.   Antimicrobials:  Anti-infectives (From admission, onward)    Start     Dose/Rate Route Frequency Ordered Stop   08/05/23 1000  piperacillin -tazobactam (ZOSYN ) IVPB 3.375 g        3.375 g 12.5 mL/hr over 240 Minutes Intravenous Every 8 hours 08/05/23 0531 08/10/23 1359   08/05/23 0630  fluconazole  (DIFLUCAN ) IVPB 400 mg        400 mg 100 mL/hr over 120 Minutes Intravenous Every 24 hours 08/05/23 0533 08/10/23 1059   08/05/23 0600  cefoTEtan  (CEFOTAN ) 2 g in sodium chloride  0.9 % 100 mL IVPB        2 g 200 mL/hr over 30 Minutes Intravenous On call to O.R. 08/05/23 0138 08/05/23 0427   08/05/23 0253  sodium chloride  0.9 % with cefoTEtan  (CEFOTAN ) ADS Med       Note to Pharmacy: Joshua Planas S: cabinet override      08/05/23 0253 08/05/23 0359   08/05/23 0115  piperacillin -tazobactam (ZOSYN ) IVPB 3.375 g        3.375 g 100 mL/hr over 30 Minutes Intravenous  Once 08/05/23 0112 08/05/23 0155        Medication:   chlorhexidine   15 mL Mouth/Throat Once   Chlorhexidine  Gluconate Cloth  6 each Topical Daily   folic acid   1 mg Oral Daily   midodrine   2.5 mg Oral TID WC   nicotine   14 mg Transdermal Daily   pantoprazole  (PROTONIX ) IV  40 mg Intravenous Q12H   sertraline   25 mg Oral Daily   sucralfate   1 g Oral TID WC & HS    LORazepam , methocarbamol  (ROBAXIN ) injection, morphine  injection, ondansetron  (ZOFRAN ) IV, mouth rinse, phenol   Objective:   Vitals:   08/06/23 0800 08/06/23 0900 08/06/23 1000 08/06/23 1019  BP: (!) 98/55 125/79 100/63   Pulse: 97 (!) 102 100 97  Resp: 12 20 10 13   Temp:      TempSrc:      SpO2: 96% 99% 97% 99%  Weight:      Height:         Intake/Output Summary (Last 24 hours) at 08/06/2023 1051 Last data filed at 08/06/2023 1003 Gross per 24 hour  Intake 3352.82 ml  Output 2875 ml  Net 477.82 ml   Filed Weights   08/04/23 2317  Weight: 54.4 kg     Physical examination:   General:  AAO x 3,  cooperative, no distress;   HEENT:  Normocephalic, PERRL, otherwise with in Normal limits   Neuro:  CNII-XII intact. , normal motor and sensation, reflexes intact   Lungs:   Clear to auscultation BL, Respirations unlabored,  No wheezes / crackles  Cardio:    S1/S2, RRR, No murmure, No Rubs or Gallops   Abdomen:  Soft, diffuse tenderness, hypoactive bowel sounds, surgical wound dressing in place, abdominal binder in place, JP drain - draining,  no guarding or peritoneal signs.  Muscular  skeletal:  Limited exam -global generalized  weaknesses - in bed, able to move all 4 extremities,   2+ pulses,  symmetric, No pitting edema  Skin:  Dry, warm to touch, negative for any Rashes,  Wounds: Please see nursing documentation         ------------------------------------------------------------------------------------------------------------------------------------------    LABs:     Latest Ref Rng & Units 08/06/2023    4:19 AM 08/04/2023   11:19 PM 07/30/2023    9:39 AM  CBC  WBC 4.0 - 10.5 K/uL 5.4  5.5  4.2   Hemoglobin 12.0 - 15.0 g/dL 7.8  89.5  9.4   Hematocrit 36.0 - 46.0 % 24.4  31.2  29.1   Platelets 150 - 400 K/uL 76  123  130       Latest Ref Rng & Units 08/06/2023    4:19 AM 08/06/2023   12:34 AM 08/05/2023    5:51 PM  CMP  Glucose 70 - 99 mg/dL 883  874  867   BUN 6 - 20 mg/dL 10  10  9    Creatinine 0.44 - 1.00 mg/dL 9.55  9.53  9.44   Sodium 135 - 145 mmol/L 128  129  127   Potassium 3.5 - 5.1 mmol/L 4.2  4.4  4.3   Chloride 98 - 111 mmol/L 100  99  100   CO2 22 - 32 mmol/L 23  21  18    Calcium  8.9 - 10.3 mg/dL 7.8  8.0  7.7   Total Protein 6.5 - 8.1 g/dL 5.8     Total Bilirubin 0.0 - 1.2 mg/dL 1.4      Alkaline Phos 38 - 126 U/L 42     AST 15 - 41 U/L 73     ALT 0 - 44 U/L 23          Micro Results Recent Results (from the past 240 hours)  Aerobic/Anaerobic Culture w Gram Stain (surgical/deep wound)     Status: None (Preliminary result)   Collection Time: 08/05/23  4:56 AM   Specimen: Path fluid; Body Fluid  Result Value Ref Range Status   Specimen Description   Final    FLUID Performed at [facility], [address] [place], [name] [address]    Special Requests   Final    NONE Performed at [facility], [address] [place], [name] [address]    Gram Stain   Final    RARE WBC SEEN RARE GRAM POSITIVE COCCI RARE GRAM NEGATIVE RODS Performed at [facility], [address] [place], [name] [address]    Culture PENDING  Incomplete   Report Status PENDING  Incomplete  MRSA Next Gen by PCR, Nasal     Status: None   Collection Time: 08/05/23  7:53 AM   Specimen: Nasal Mucosa; Nasal Swab  Result Value Ref Range Status   MRSA by PCR Next Gen NOT DETECTED NOT DETECTED Final    Comment: (NOTE) The GeneXpert MRSA Assay (FDA approved for NASAL specimens only), is one component of a comprehensive MRSA colonization surveillance program. It is not intended to diagnose MRSA infection nor to guide or monitor treatment for MRSA infections. Test performance is not FDA approved in patients less than 37 years old. Performed at [facility], [address] [place], [name] [address]     Radiology Reports No results found.   SIGNED: Adriana DELENA Grams, MD, FHM. FAAFP. [facility] - [facility] Critical care time spent - 55 min.  In seeing, evaluating and examining the patient.  Reviewing medical records, labs, drawn plan of care. [facility],  Pager (please use [email or web address] to page/ text) Please use Epic Secure Chat for non-urgent communication (7AM-7PM)   If 7PM-7AM, please contact night-coverage [email or web address], 08/06/2023, 10:51 AM

## 2023-08-07 ENCOUNTER — Inpatient Hospital Stay (HOSPITAL_COMMUNITY)

## 2023-08-07 ENCOUNTER — Encounter (HOSPITAL_COMMUNITY): Payer: Self-pay | Admitting: General Surgery

## 2023-08-07 DIAGNOSIS — R188 Other ascites: Secondary | ICD-10-CM | POA: Diagnosis not present

## 2023-08-07 DIAGNOSIS — F1090 Alcohol use, unspecified, uncomplicated: Secondary | ICD-10-CM

## 2023-08-07 DIAGNOSIS — K766 Portal hypertension: Secondary | ICD-10-CM | POA: Diagnosis not present

## 2023-08-07 DIAGNOSIS — K255 Chronic or unspecified gastric ulcer with perforation: Secondary | ICD-10-CM | POA: Diagnosis not present

## 2023-08-07 DIAGNOSIS — K7460 Unspecified cirrhosis of liver: Secondary | ICD-10-CM | POA: Diagnosis not present

## 2023-08-07 DIAGNOSIS — Z452 Encounter for adjustment and management of vascular access device: Secondary | ICD-10-CM | POA: Diagnosis not present

## 2023-08-07 DIAGNOSIS — R109 Unspecified abdominal pain: Secondary | ICD-10-CM | POA: Diagnosis not present

## 2023-08-07 DIAGNOSIS — Z4682 Encounter for fitting and adjustment of non-vascular catheter: Secondary | ICD-10-CM | POA: Diagnosis not present

## 2023-08-07 DIAGNOSIS — D509 Iron deficiency anemia, unspecified: Secondary | ICD-10-CM

## 2023-08-07 DIAGNOSIS — R198 Other specified symptoms and signs involving the digestive system and abdomen: Secondary | ICD-10-CM | POA: Diagnosis not present

## 2023-08-07 LAB — COMPREHENSIVE METABOLIC PANEL WITH GFR
ALT: 20 U/L (ref 0–44)
AST: 54 U/L — ABNORMAL HIGH (ref 15–41)
Albumin: 1.9 g/dL — ABNORMAL LOW (ref 3.5–5.0)
Alkaline Phosphatase: 39 U/L (ref 38–126)
Anion gap: 5 (ref 5–15)
BUN: 6 mg/dL (ref 6–20)
CO2: 25 mmol/L (ref 22–32)
Calcium: 7.8 mg/dL — ABNORMAL LOW (ref 8.9–10.3)
Chloride: 95 mmol/L — ABNORMAL LOW (ref 98–111)
Creatinine, Ser: 0.49 mg/dL (ref 0.44–1.00)
GFR, Estimated: >60 mL/min (ref >60)
Glucose, Bld: 107 mg/dL — ABNORMAL HIGH (ref 70–99)
Potassium: 3.9 mmol/L (ref 3.5–5.1)
Sodium: 125 mmol/L — ABNORMAL LOW (ref 135–145)
Total Bilirubin: 0.8 mg/dL (ref 0.0–1.2)
Total Protein: 5.5 g/dL — ABNORMAL LOW (ref 6.5–8.1)

## 2023-08-07 LAB — GLUCOSE, CAPILLARY
Glucose-Capillary: 101 mg/dL — ABNORMAL HIGH (ref 70–99)
Glucose-Capillary: 112 mg/dL — ABNORMAL HIGH (ref 70–99)
Glucose-Capillary: 112 mg/dL — ABNORMAL HIGH (ref 70–99)
Glucose-Capillary: 115 mg/dL — ABNORMAL HIGH (ref 70–99)
Glucose-Capillary: 119 mg/dL — ABNORMAL HIGH (ref 70–99)
Glucose-Capillary: 97 mg/dL (ref 70–99)

## 2023-08-07 LAB — CBC
HCT: 22.7 % — ABNORMAL LOW (ref 36.0–46.0)
Hemoglobin: 7.1 g/dL — ABNORMAL LOW (ref 12.0–15.0)
MCH: 29.2 pg (ref 26.0–34.0)
MCHC: 31.3 g/dL (ref 30.0–36.0)
MCV: 93.4 fL (ref 80.0–100.0)
Platelets: 76 K/uL — ABNORMAL LOW (ref 150–400)
RBC: 2.43 MIL/uL — ABNORMAL LOW (ref 3.87–5.11)
RDW: 18.2 % — ABNORMAL HIGH (ref 11.5–15.5)
WBC: 5 K/uL (ref 4.0–10.5)
nRBC: 0 % (ref 0.0–0.2)

## 2023-08-07 LAB — HEMOGLOBIN AND HEMATOCRIT, BLOOD
HCT: 30.9 % — ABNORMAL LOW (ref 36.0–46.0)
Hemoglobin: 10.3 g/dL — ABNORMAL LOW (ref 12.0–15.0)

## 2023-08-07 LAB — PROTIME-INR
INR: 1.4 — ABNORMAL HIGH (ref 0.8–1.2)
Prothrombin Time: 18.3 s — ABNORMAL HIGH (ref 11.4–15.2)

## 2023-08-07 LAB — MAGNESIUM: Magnesium: 1.6 mg/dL — ABNORMAL LOW (ref 1.7–2.4)

## 2023-08-07 LAB — PHOSPHORUS: Phosphorus: 2.6 mg/dL (ref 2.5–4.6)

## 2023-08-07 LAB — PREPARE RBC (CROSSMATCH)

## 2023-08-07 MED ORDER — MAGNESIUM SULFATE 2 GM/50ML IV SOLN
2.0000 g | Freq: Once | INTRAVENOUS | Status: AC
  Administered 2023-08-07: 2 g via INTRAVENOUS
  Filled 2023-08-07: qty 50

## 2023-08-07 MED ORDER — SODIUM CHLORIDE 0.9% IV SOLUTION: Freq: Once | INTRAVENOUS | Status: AC

## 2023-08-07 MED ORDER — TRAVASOL 10 % IV SOLN
INTRAVENOUS | Status: AC
  Filled 2023-08-07: qty 810

## 2023-08-07 MED ORDER — TRAVASOL 10 % IV SOLN: INTRAVENOUS | Status: DC

## 2023-08-07 MED ORDER — MORPHINE SULFATE (PF) 2 MG/ML IV SOLN
2.0000 mg | INTRAVENOUS | Status: DC | PRN
  Administered 2023-08-07 – 2023-08-13 (×33): 2 mg via INTRAVENOUS
  Filled 2023-08-07 (×34): qty 1

## 2023-08-07 NOTE — Progress Notes (Signed)
 2 Days Post-Op  Subjective: [name] #POD2 s/p exploratory laparotomy, gastrorrhaphy, omental patch, wedge liver biopsy, and primary repair of umbilical hernia for gastric perforation with free air, abdominal distension, and peritonitis presents with ongoing pain. Overnight, nursing staff noted hallucinations and disorientation (seeing people in the room who were not present). She denies vomiting but endorses persistent pleuritic chest pain, incisional abdominal pain, throat/neck soreness (possibly worsened by sleeping position), sore nares from NG tube, nausea, and dyspnea. She also reports pain with coughing and laughing. She is able to ambulate with assistance for urination. No flatus or bowel movement since surgery.   Objective: Vital signs in last 24 hours: Temp:  [97.7 F (36.5 C)-100.5 F (38.1 C)] 99.9 F (37.7 C) (07/29 0945) Pulse Rate:  [90-113] 93 (07/29 1000) Resp:  [9-19] 13 (07/29 1000) BP: (100-153)/(51-94) 127/78 (07/29 1000) SpO2:  [93 %-100 %] 97 % (07/29 1000) Weight:  [58 kg] 58 kg (07/29 0500)    Intake/Output from previous day: 07/28 0701 - 07/29 0700 In: 2344 [I.V.:1758.8; NG/GT:60; IV Piggyback:525.2] Out: 800 [Emesis/NG output:300; Drains:500] Intake/Output this shift: Total I/O In: -  Out: 310 [Urine:250; Drains:60]  Physical Exam Constitutional:      General: She is not in acute distress.    Appearance: She is not ill-appearing or toxic-appearing.  HENT:     Head: Normocephalic and atraumatic.  Cardiovascular:     Rate and Rhythm: Normal rate and regular rhythm.     Heart sounds: Normal heart sounds. No murmur heard.    No friction rub. No gallop.  Pulmonary:     Effort: No respiratory distress.     Breath sounds: Normal breath sounds. No stridor. No wheezing, rhonchi or rales.  Abdominal:     General: Abdomen is flat. Bowel sounds are decreased. There is no distension.     Palpations: Abdomen is soft.     Tenderness:  There is abdominal tenderness in the right upper quadrant, right lower quadrant, epigastric area, periumbilical area and left lower quadrant.     Hernia: No hernia is present.  Skin:    General: Skin is warm and dry.     Coloration: Skin is not cyanotic or jaundiced.     Findings: No erythema.  Neurological:     Mental Status: She is alert.  Psychiatric:        Mood and Affect: Mood normal.        Behavior: Behavior normal.    Lab Results:  Recent Labs    08/06/23 0419 08/07/23 0302  WBC 5.4 5.0  HGB 7.8* 7.1*  HCT 24.4* 22.7*  PLT 76* 76*   BMET Recent Labs    08/06/23 0419 08/07/23 0302  NA 128* 125*  K 4.2 3.9  CL 100 95*  CO2 23 25  GLUCOSE 116* 107*  BUN 10 6  CREATININE 0.44 0.49  CALCIUM  7.8* 7.8*   PT/INR Recent Labs    08/06/23 1201 08/07/23 0302  LABPROT 19.2* 18.3*  INR 1.5* 1.4*    Studies/Results: US  Abdomen Limited Result Date: 08/06/2023 CLINICAL DATA:  Evaluate for ascites. EXAM: LIMITED ABDOMEN ULTRASOUND FOR ASCITES TECHNIQUE: Limited ultrasound survey for ascites was performed in all four abdominal quadrants. COMPARISON:  CT abdomen August 05, 2023 FINDINGS: No free fluid within the limited images provided in 4 quadrants. IMPRESSION: No ascites identified on current exam and may be obscured by bowel gas (on recent CT from 08/05/2023 there is ascites in perihepatic, perisplenic and  pelvic cavity). Electronically Signed   By: Megan  Zare M.D.   On: 08/06/2023 16:53   US  EKG SITE RITE Result Date: 08/06/2023 If Site Rite image not attached, placement could not be confirmed due to current cardiac rhythm.   Anti-infectives: Anti-infectives (From admission, onward)    Start     Dose/Rate Route Frequency Ordered Stop   08/05/23 1000  piperacillin -tazobactam (ZOSYN ) IVPB 3.375 g        3.375 g 12.5 mL/hr over 240 Minutes Intravenous Every 8 hours 08/05/23 0531 08/10/23 1359   08/05/23 0630  fluconazole  (DIFLUCAN ) IVPB 400 mg        400 mg 100  mL/hr over 120 Minutes Intravenous Every 24 hours 08/05/23 0533 08/10/23 1059   08/05/23 0600  cefoTEtan  (CEFOTAN ) 2 g in sodium chloride  0.9 % 100 mL IVPB        2 g 200 mL/hr over 30 Minutes Intravenous On call to O.R. 08/05/23 0138 08/05/23 0427   08/05/23 0253  sodium chloride  0.9 % with cefoTEtan  (CEFOTAN ) ADS Med       Note to Pharmacy: Joshua Planas S: cabinet override      08/05/23 0253 08/05/23 0359   08/05/23 0115  piperacillin -tazobactam (ZOSYN ) IVPB 3.375 g        3.375 g 100 mL/hr over 30 Minutes Intravenous  Once 08/05/23 0112 08/05/23 0155       Assessment/Plan: s/p Procedure(s): GASTRORRHAPHY LAPAROTOMY, EXPLORATORY BIOPSY, LIVER REPAIR, HERNIA, UMBILICAL, ADULT  Assessment: [name]  #POD2 s/p exploratory laparotomy, gastrorrhaphy, omental patch, wedge liver biopsy, and primary repair of umbilical hernia for gastric perforation, with new onset hallucinations and confusion concerning for delirium, anemia with down-trending H/H requiring transfusion, post-op abdominal pain and pleuritic pain, and no return of bowel function.  - Continue NPO, NG tube, and ice chips for comfort.  - Avoid the use of NSAIDs.  - Continue to promote ambulation. - Continue PPI BID. - Continue Zosyn  and Diflucan  until 7/31 (per the STOP it trial). - Continue to trend labs, tomorrow, to monitor hemodynamic stability and potential electrolyte disturbances.   - Foley catheter removed. - Upper GI Series planned for Thursday as long as patient continues to progress and heal appropriately, and as long as radiology is available to conduct the procedure.  - PICC line inserted for TPN to address long term nutrition.  - Continue to hold prophylaxis for now given cirrhosis and recent surgery.  - Continue the use of SCDs to promote circulation and prevent blood loss.    LOS: 2 days    Joan Wood 08/07/2023

## 2023-08-07 NOTE — Progress Notes (Signed)
 PHARMACY - TOTAL PARENTERAL NUTRITION CONSULT NOTE   Indication: bowel perforation  Patient Measurements: Height: 5' 2 (157.5 cm) Weight: 58 kg (127 lb 13.9 oz) IBW/kg (Calculated) : 50.1 TPN AdjBW (KG): 54.4 Body mass index is 23.39 kg/m.  Assessment:  53 yo female admitted with perforated gastric ulcer in the setting of cirrhosis and umbilical hernia.   Glucose / Insulin : 89-128 Electrolytes: Na 128> 125 Phos 2.0> 2.6 Mag 1.6 Renal: WNL Hepatic: Tbili 1.4   Albumin 2.2 Intake / Output; MIVF:  D/C MIVF per MD with TPN at 18mls/hr  GI Imaging/ Procedures:  7/27:  ex lap, gastrorrhaphy, omental patch, liver biopsy, and umbilical hernia repair.  Central access: PICC placement 7/28 TPN start date: 7/28  Nutritional Goals: TPN will provide Protein 81g, CHO, 979KCal, Fat, 360 Kcal for total of 1663kcal/day Patient will meet greater than or equal to 90% of their needs   RD Assessment:  Ideal Body Weight:  50 kg  BMI:  Body mass index is 21.95 kg/m.  Estimated Nutritional Needs:   Kcal:  1600-1800  Protein:  75-90 gm  Fluid:  1.6-1.8 L   Current Nutrition:  NPO  Plan:  Magnesium  Sulfate 2gm IV now Increase  TPN to 75 mL/hr at 1800 Electrolytes in TPN: Na 38mEq/L, K 10mEq/L, Ca 11mEq/L, Mg 65mEq/L, and Phos 15mmol/L. Cl:Ac 1:1 Add standard MVI and trace elements to TPN Initiate Sensitive q6h SSI and adjust as needed  Phos/mag and CMP in AM Monitor TPN labs on Mon/Thurs  Yates Weisgerber, BS Pharm D, BCPS Clinical Pharmacist 08/07/2023 8:43 AM

## 2023-08-07 NOTE — Progress Notes (Signed)
 Nutrition Follow-up  DOCUMENTATION CODES:   Non-severe (moderate) malnutrition in context of acute illness/injury  INTERVENTION:   TPN to meet 100% of estimated nutrition needs as able.  Continue to monitor magnesium , potassium, and phosphorus for at least 3 days, MD to replete as needed, as pt is at risk for refeeding syndrome given malnutrition, minimal intake for the past week, and hx ETOH abuse.  Continue Thiamine  100 mg daily for 7 days. For vitamin D  deficiency, recommend vitamin D  supplementation 6,000 international units daily or 50,000 international units weekly x 8 weeks, then 1,000 international units daily for several months for maintenance. If able, add to TPN starting tomorrow. Discussed with Pharmacist.   NUTRITION DIAGNOSIS:   Moderate Malnutrition related to acute illness as evidenced by energy intake < 75% for > 7 days, mild muscle depletion, percent weight loss (1.6% weight loss x 1 week); ongoing   GOAL:   Patient will meet greater than or equal to 90% of their needs; progressing with TPN initiation  MONITOR:   I & O's, Diet advancement  REASON FOR ASSESSMENT:   Consult New TPN/TNA  ASSESSMENT:   53 yo female admitted with perforated gastric ulcer in the setting of cirrhosis and umbilical hernia. PMH includes SVT, GERD, HTN, abdominal wall hernia, anxiety, chronic alcohol use, liver cirrhosis.  7/27: S/P ex lap, gastrorrhaphy, omental patch, liver biopsy, and umbilical hernia repair.  TPN was initiated 7/28 after PICC placement. Current TPN is infusing at 40 ml/h, increasing to goal rate of 75 ml/h today to provide 1663 kcal and 81 gm protein daily. This will meet 100% of estimated calorie and protein needs.   Remains NPO except ice chips and sips with medications.   NG output is decreasing.  NG tube to LIS: 300 ml output x 24 hours JP drain in R abdomen: 500 ml output x 24 hours; 60 ml output documented so far today.  Labs reviewed. Na 125 (L), phos  2.6 (WNL), mag 106 (L) Vitamin D , 25-Hydroxy 19.3 (07/30/23) = vitamin D  deficiency CBG: 89-119-112-115  Medications reviewed and include folic acid , novolog , protonix , sucralfate . Receiving mag sulfate for repletion. IVF discontinued today.    Admit weight: 54.4 kg (7/26) Current weight: 58 kg (7/29)  Diet Order:   Diet Order             Diet NPO time specified Except for: Ice Chips, Sips with Meds  Diet effective now                   EDUCATION NEEDS:   No education needs have been identified at this time  Skin:  Skin Assessment: Skin Integrity Issues: Skin Integrity Issues:: Incisions Incisions: surgical incision to abdomen  Last BM:  no BM documented  Height:   Ht Readings from Last 1 Encounters:  08/04/23 5' 2 (1.575 m)    Weight:   Wt Readings from Last 1 Encounters:  08/07/23 58 kg    Ideal Body Weight:  50 kg  BMI:  Body mass index is 23.39 kg/m.  Estimated Nutritional Needs:   Kcal:  1600-1800  Protein:  75-90 gm  Fluid:  1.6-1.8 L   Suzen HUNT RD, LDN, CNSC Contact via secure chat. If unavailable, use group chat RD Inpatient.

## 2023-08-07 NOTE — Plan of Care (Signed)
  Problem: Education: Goal: Knowledge of General Education information will improve Description: Including pain rating scale, medication(s)/side effects and non-pharmacologic comfort measures Outcome: Progressing   Problem: Health Behavior/Discharge Planning: Goal: Ability to manage health-related needs will improve Outcome: Progressing   Problem: Clinical Measurements: Goal: Ability to maintain clinical measurements within normal limits will improve Outcome: Progressing Goal: Will remain free from infection Outcome: Progressing Goal: Diagnostic test results will improve Outcome: Progressing Goal: Respiratory complications will improve Outcome: Progressing Goal: Cardiovascular complication will be avoided Outcome: Progressing   Problem: Activity: Goal: Risk for activity intolerance will decrease Outcome: Not Progressing   Problem: Nutrition: Goal: Adequate nutrition will be maintained Outcome: Progressing   Problem: Coping: Goal: Level of anxiety will decrease Outcome: Progressing   Problem: Pain Managment: Goal: General experience of comfort will improve and/or be controlled Outcome: Not Progressing   Problem: Safety: Goal: Ability to remain free from injury will improve Outcome: Progressing   Problem: Skin Integrity: Goal: Risk for impaired skin integrity will decrease Outcome: Progressing

## 2023-08-07 NOTE — Progress Notes (Signed)
 PROGRESS NOTE    Patient: Joan Wood                            PCP: Zarwolo, Gloria, FNP                    DOB: 08-14-1970            DOA: 08/04/2023 FMW:[id number]             DOS: 08/07/2023, 1:22 PM   LOS: 2 days   Date of Service: The patient was seen and examined on 08/07/2023  Subjective:   Postop day #2 Status post laparoscopic exploratory laparotomy for gastric perforation.  Omental patch, liver biopsy, umbilical hernia repair The patient was seen and examined this morning, stable no acute distress, somnolent,  Nursing staff complaining of lots of abdominal pain questing IV pain meds NG tube in place-wall suction N.p.o. JP drain draining copious grayish fluid Hemodynamically stable  For soft BP, drop in hemoglobin, Tmax 100.5   Brief Narrative:   Joan Wood is a 53 y.o. female with medical history significant of anxiety, GERD, cirrhosis, hypertension who presents emergency department due to abdominal pain.   On arrival she was afebrile and hemodynamically stable, labs showed sodium 122, potassium 3.3, creatinine 0.57, WBC 5.5, hemoglobin 10.4, INR 1.3.  Patient CT abdomen pelvis which showed perforated gastric ulcer.  Surgery was consulted and she was taken to the OR for exploratory laparotomy, omental patch and hernia repair.   She was admitted to the ICU.  Procedure without complication.   Assessment/Plan Principal Problem:   Perforated abdominal viscus Active Problems:   Perforated ulcer (HCC)   Incarcerated umbilical hernia   Alcoholic cirrhosis (HCC)   Pneumoperitoneum   Perforated gastric ulcer Gastric ulcer perforation with liver cirrhosis, umbilical hernia with incarcerated omental and recanalized umbilical vein  Postop day #2 -S/p exploratory laparotomy: gastrorrhaphy, omental patch, liver biopsy, primary umbilical hernia repair completed   - Tmax 100.5, RR 23 currently 12, pulse 115, 91 now BP 126/89 (as it was 98/55-midodrine ) -NG tube in  place-draining -Continue ice chips for comfort - JP drain in place  -Zosyn , and diflucan  -Protonix  40 mg BID - IV muscle relaxants as needed  Foley catheter, postop-removed Hold anticoagulation at this time, will reassess for prophylaxis -if okay with general surgery  - Evaluated for PICC line as per surgery is to be n.p.o. till Thursday -TPN initiated per surgery - DC IVF   Hypotensive  - Postop blood pressure remained low-blood pressure improving - Started midodrine  -- will taper off - Did not require pressors yet   Decompensated alcoholic cirrhosis -with ascites -trend LFTs closely monitor for peritonitis -No signs of alcohol withdrawal -Patient does not want her family to know about her diagnosis alcoholic liver cirrhosis -GI consult for evaluation   Hyponatremia -Serum sodium: 126>>  128 >> 125  -Secondary to liver cirrhosis with chronic alcohol abuse likely related to alcohol intake.   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, and malnutrition -Continue IVF, TPN - Anticipating improvement in sodium level   Acute on chronic anemia -iron deficiency - Acute on chronic anemia with iron deficiency, liver cirrhosis, progressive decline postop hemoglobin -symptomatic with hypotension -Hemoglobin dropping from 10.4 >> 7.8 >> 7.1 - With hypotension-  pursuing with 2U PRBC blood transfusion - Gust with surgery and patient, consent was obtained  Iron/TIBC/Ferritin/ %Sat  Component Value Date/Time   IRON 8 (L) 08/06/2023 0419   IRON 73 04/11/2023 1455   TIBC 310 08/06/2023 0419   TIBC 373 04/11/2023 1455   FERRITIN 266 05/12/2023 0930   FERRITIN 165 (H) 04/11/2023 1455   IRONPCTSAT 3 (L) 08/06/2023 0419   IRONPCTSAT 20 04/11/2023 1455  B12 at 757, folate at 6.5  -08/07/2023 2U PRBC blood transfusion -GI has been consulted for liver cirrhosis and ascites appreciate input regarding  anemia     ----------------------------------------------------------------------------------------------------------------------------------------------- Nutritional status:  The patient's BMI is: Body mass index is 23.39 kg/m. I agree with the assessment and plan as outlined -----------------------------------------------------------------------------------------------------------------------------------------  DVT prophylaxis:  SCD's Start: 08/05/23 0521   Code Status:   Code Status: Full Code  Family Communication: No family member present at bedside-  Patient requesting her ex-husband to be kept updated-does not want her children to be notified or noted of her medical condition including liver cirrhosis  -Advance care planning has been discussed.   Admission status:   Status is: Inpatient Remains inpatient appropriate because: S/p laparoscopic abdominal surgery n.p.o. needing IV fluid IV antibiotics,   Disposition: From  - home             Planning for discharge in >3 days   Procedures:   No admission procedures for hospital encounter.   Antimicrobials:  Anti-infectives (From admission, onward)    Start     Dose/Rate Route Frequency Ordered Stop   08/05/23 1000  piperacillin -tazobactam (ZOSYN ) IVPB 3.375 g        3.375 g 12.5 mL/hr over 240 Minutes Intravenous Every 8 hours 08/05/23 0531 08/10/23 1359   08/05/23 0630  fluconazole  (DIFLUCAN ) IVPB 400 mg        400 mg 100 mL/hr over 120 Minutes Intravenous Every 24 hours 08/05/23 0533 08/10/23 1059   08/05/23 0600  cefoTEtan  (CEFOTAN ) 2 g in sodium chloride  0.9 % 100 mL IVPB        2 g 200 mL/hr over 30 Minutes Intravenous On call to O.R. 08/05/23 0138 08/05/23 0427   08/05/23 0253  sodium chloride  0.9 % with cefoTEtan  (CEFOTAN ) ADS Med       Note to Pharmacy: Joshua Planas S: cabinet override      08/05/23 0253 08/05/23 0359   08/05/23 0115  piperacillin -tazobactam (ZOSYN ) IVPB 3.375 g        3.375 g 100 mL/hr  over 30 Minutes Intravenous  Once 08/05/23 0112 08/05/23 0155        Medication:   chlorhexidine   15 mL Mouth/Throat Once   Chlorhexidine  Gluconate Cloth  6 each Topical Daily   folic acid   1 mg Oral Daily   insulin  aspart  0-9 Units Subcutaneous Q6H   midodrine   2.5 mg Oral TID WC   nicotine   14 mg Transdermal Daily   pantoprazole  (PROTONIX ) IV  40 mg Intravenous Q12H   sertraline   25 mg Oral Daily   sodium chloride  flush  10-40 mL Intracatheter Q12H   sucralfate   1 g Oral TID WC & HS    diphenhydrAMINE , diphenhydrAMINE -zinc  acetate, LORazepam , methocarbamol  (ROBAXIN ) injection, morphine  injection, ondansetron  (ZOFRAN ) IV, mouth rinse, phenol, sodium chloride  flush   Objective:   Vitals:   08/07/23 1118 08/07/23 1248 08/07/23 1300 08/07/23 1315  BP: 118/79 (!) 151/86 (!) 143/89 126/89  Pulse: 95 98 90 91  Resp: 13 16 13 12   Temp: 98.9 F (37.2 C) 99 F (37.2 C) 98.4 F (36.9 C) 98.4 F (36.9 C)  TempSrc: Oral Oral  Oral Oral  SpO2: 97% 95% 95% 94%  Weight:      Height:        Intake/Output Summary (Last 24 hours) at 08/07/2023 1322 Last data filed at 08/07/2023 1239 Gross per 24 hour  Intake 3388.93 ml  Output 1045 ml  Net 2343.93 ml   Filed Weights   08/04/23 2317 08/07/23 0500  Weight: 54.4 kg 58 kg     Physical examination:        General:  AAO x 3,  cooperative, somnolent/sleepy, arousable  HEENT:  NG tube in place to wall suction Normocephalic, PERRL, otherwise with in Normal limits   Neuro:  CNII-XII intact. , normal motor and sensation, reflexes intact   Lungs:   Clear to auscultation BL, Respirations unlabored,  No wheezes / crackles  Cardio:    S1/S2, RRR, No murmure, No Rubs or Gallops   Abdomen:  Soft, abdominal surgical wound with abdominal binder, JP drain draining Diffuse tenderness, hypoactive bowel sounds no guarding or peritoneal signs.  Muscular  skeletal:  Limited exam -global generalized weaknesses - in bed, able to move all 4  extremities,   2+ pulses,  symmetric, No pitting edema  Skin:  Dry, warm to touch, abdominal surgical wound-clean, dressing and binder in place  Wounds: Abdominal surgical wound with JP drain in place            ------------------------------------------------------------------------------------------------------------------------------------------    LABs:     Latest Ref Rng & Units 08/07/2023    3:02 AM 08/06/2023    4:19 AM 08/04/2023   11:19 PM  CBC  WBC 4.0 - 10.5 K/uL 5.0  5.4  5.5   Hemoglobin 12.0 - 15.0 g/dL 7.1  7.8  89.5   Hematocrit 36.0 - 46.0 % 22.7  24.4  31.2   Platelets 150 - 400 K/uL 76  76  123       Latest Ref Rng & Units 08/07/2023    3:02 AM 08/06/2023    4:19 AM 08/06/2023   12:34 AM  CMP  Glucose 70 - 99 mg/dL 892  883  874   BUN 6 - 20 mg/dL 6  10  10    Creatinine 0.44 - 1.00 mg/dL 9.50  9.55  9.53   Sodium 135 - 145 mmol/L 125  128  129   Potassium 3.5 - 5.1 mmol/L 3.9  4.2  4.4   Chloride 98 - 111 mmol/L 95  100  99   CO2 22 - 32 mmol/L 25  23  21    Calcium  8.9 - 10.3 mg/dL 7.8  7.8  8.0   Total Protein 6.5 - 8.1 g/dL 5.5  5.8    Total Bilirubin 0.0 - 1.2 mg/dL 0.8  1.4    Alkaline Phos 38 - 126 U/L 39  42    AST 15 - 41 U/L 54  73    ALT 0 - 44 U/L 20  23         Micro Results Recent Results (from the past 240 hours)  Aerobic/Anaerobic Culture w Gram Stain (surgical/deep wound)     Status: None (Preliminary result)   Collection Time: 08/05/23  4:56 AM   Specimen: Path fluid; Body Fluid  Result Value Ref Range Status   Specimen Description   Final    FLUID Performed at [facility], [address] [place], [name] [address]    Special Requests   Final    NONE Performed at [facility], [address], [place], [name] [address]  Gram Stain   Final    RARE WBC SEEN RARE GRAM POSITIVE COCCI RARE GRAM NEGATIVE RODS    Culture   Final    FEW MULTIPLE ORGANISMS PRESENT, NONE PREDOMINANT NO GROUP A STREP (S.PYOGENES) ISOLATED NO  STAPHYLOCOCCUS AUREUS ISOLATED Performed at [facility], [address], [place], [name] [address]    Report Status PENDING  Incomplete  MRSA Next Gen by PCR, Nasal     Status: None   Collection Time: 08/05/23  7:53 AM   Specimen: Nasal Mucosa; Nasal Swab  Result Value Ref Range Status   MRSA by PCR Next Gen NOT DETECTED NOT DETECTED Final    Comment: (NOTE) The GeneXpert MRSA Assay (FDA approved for NASAL specimens only), is one component of a comprehensive MRSA colonization surveillance program. It is not intended to diagnose MRSA infection nor to guide or monitor treatment for MRSA infections. Test performance is not FDA approved in patients less than 2 years old. Performed at [facility], [address], [place], [name] [address]     Radiology Reports No results found.   SIGNED: Adriana DELENA Grams, MD, FHM. FAAFP. Jolynn Pack - [facility] Critical care time spent - 55 min.  In seeing, evaluating and examining the patient. Reviewing medical records, labs, drawn plan of care. [facility],  Pager (please use [email or web address] to page/ text) Please use Epic Secure Chat for non-urgent communication (7AM-7PM)   If 7PM-7AM, please contact night-coverage [email or web address], 08/07/2023, 1:22 PM

## 2023-08-07 NOTE — TOC Initial Note (Signed)
 Transition of Care [facility]) - Initial/Assessment Note    Patient Details  Name: Joan Wood MRN: [id number] Date of Birth: 09-02-1970  Transition of Care [facility]) CM/SW Contact:    Sharlyne Stabs, RN Phone Number: 08/07/2023, 2:04 PM   Clinical Narrative:          Patient admitted with Perforated abdominal viscus.   Considered to be a high risk for readmission.  Patient live with a roommate, she cannot drive, room mate provide transportation when he is in town.  Uses medical transport if her roommate is not available.  Patient owns DME: cane, crutches, and wheelchair.          Barriers to Discharge: Continued Medical Work up   Patient Goals and CMS Choice Patient states their goals for this hospitalization and ongoing recovery are:: Return home,          Expected Discharge Plan and Services       Living arrangements for the past 2 months: Single Family Home           Prior Living Arrangements/Services Living arrangements for the past 2 months: Single Family Home Lives with:: Roommate              Current home services: DME    Activities of Daily Living        Emotional Assessment     Affect (typically observed): Accepting Orientation: : Oriented to Self, Oriented to Place, Oriented to  Time, Oriented to Situation Alcohol / Substance Use: Alcohol Use Psych Involvement: No (comment)  Admission diagnosis:  Pneumoperitoneum [K66.8] Perforated ulcer (HCC) [K27.5] Perforated abdominal viscus [R19.8] Patient Active Problem List   Diagnosis Date Noted   Pneumoperitoneum 08/05/2023   Perforated abdominal viscus 08/05/2023   Syncope 07/30/2023   Depression, recurrent (HCC) 07/30/2023   Generalized abdominal pain 06/04/2023   Nausea vomiting and diarrhea 06/04/2023   Enteritis 05/28/2023   Intractable vomiting 05/28/2023   Perforated ulcer (HCC) 05/27/2023   Alcoholic cirrhosis (HCC) 05/27/2023   Hypomagnesemia 05/17/2023   Hyponatremia 05/15/2023   Tobacco  abuse 05/15/2023   Acute hyponatremia 05/11/2023   Pancreatitis 05/11/2023   Gastritis 05/11/2023   Hypokalemia 05/11/2023   Multiple closed fractures of metatarsal bone with routine healing, right 04/24/2023   Neuropathy 03/31/2023   Irritable bowel syndrome with diarrhea 03/31/2023   GERD (gastroesophageal reflux disease) 03/31/2023   Nausea and vomiting 03/31/2023   Alcohol use disorder 03/28/2022   Left hip pain 03/28/2022   Incarcerated umbilical hernia 03/28/2022   Adjustment disorder with mixed anxiety and depressed mood 12/15/2014   Malingering 12/14/2014   PCP:  Zarwolo, Gloria, FNP Pharmacy:   [facility] [phone] - [place], [place] - [address] AT [facility] [address] [name] [address] Phone: [phone] Fax: [phone]     Social Drivers of Health (SDOH) Social History: SDOH Screenings   Food Insecurity: No Food Insecurity (08/05/2023)  Housing: Low Risk  (08/05/2023)  Transportation Needs: No Transportation Needs (08/05/2023)  Utilities: Not At Risk (08/05/2023)  Depression (PHQ2-9): High Risk (07/30/2023)  Tobacco Use: High Risk (08/04/2023)   SDOH Interventions:     Readmission Risk Interventions    08/05/2023    4:14 PM  Readmission Risk Prevention Plan  Transportation Screening Complete  PCP or Specialist Appt within 5-7 Days Complete  Home Care Screening Complete  Medication Review (RN CM) Complete

## 2023-08-07 NOTE — Progress Notes (Signed)
 Subjective: Reports she is feeling some better. Continues to have abdominal pain, but not as bad. Reports no BM since surgery thus no brbpr or melena. NG tube remains in place and she has no nausea or vomiting.   Objective: Vital signs in last 24 hours: Temp:  [97.7 F (36.5 C)-100.5 F (38.1 C)] 98.4 F (36.9 C) (07/29 1315) Pulse Rate:  [90-115] 91 (07/29 1315) Resp:  [9-23] 12 (07/29 1315) BP: (101-153)/(51-94) 126/89 (07/29 1315) SpO2:  [91 %-100 %] 94 % (07/29 1315) Weight:  [58 kg] 58 kg (07/29 0500)   General:   Alert and oriented, pleasant, NAD.  Head:  Normocephalic and atraumatic. Abdomen:  Bowel sounds hypoactive. Abdominal binder in place. Generalized TTP, but soft. No rebound or guarding.  Msk:  Symmetrical without gross deformities. Normal posture. Extremities:  Without edema.SCDs in place.  Neurologic:  Alert and  oriented x4;  grossly normal neurologically. No asterixis.  Psych:  Normal mood and affect.  Intake/Output from previous day: 07/28 0701 - 07/29 0700 In: 2344 [I.V.:1758.8; NG/GT:60; IV Piggyback:525.2] Out: 800 [Emesis/NG output:300; Drains:500] Intake/Output this shift: Total I/O In: 1489.2 [I.V.:758.9; Blood:378.5; IV Piggyback:351.8] Out: 570 [Urine:425; Drains:145]  Lab Results: Recent Labs    08/04/23 2319 08/06/23 0419 08/07/23 0302  WBC 5.5 5.4 5.0  HGB 10.4* 7.8* 7.1*  HCT 31.2* 24.4* 22.7*  PLT 123* 76* 76*   BMET Recent Labs    08/06/23 0034 08/06/23 0419 08/07/23 0302  NA 129* 128* 125*  K 4.4 4.2 3.9  CL 99 100 95*  CO2 21* 23 25  GLUCOSE 125* 116* 107*  BUN 10 10 6   CREATININE 0.46 0.44 0.49  CALCIUM  8.0* 7.8* 7.8*   LFT Recent Labs    08/04/23 2319 08/06/23 0419 08/07/23 0302  PROT 7.7 5.8* 5.5*  ALBUMIN 3.1* 2.2* 1.9*  AST 116* 73* 54*  ALT 33 23 20  ALKPHOS 86 42 39  BILITOT 1.7* 1.4* 0.8   PT/INR Recent Labs    08/06/23 1201 08/07/23 0302  LABPROT 19.2* 18.3*  INR 1.5* 1.4*     Studies/Results: US  Abdomen Limited Result Date: 08/06/2023 CLINICAL DATA:  Evaluate for ascites. EXAM: LIMITED ABDOMEN ULTRASOUND FOR ASCITES TECHNIQUE: Limited ultrasound survey for ascites was performed in all four abdominal quadrants. COMPARISON:  CT abdomen August 05, 2023 FINDINGS: No free fluid within the limited images provided in 4 quadrants. IMPRESSION: No ascites identified on current exam and may be obscured by bowel gas (on recent CT from 08/05/2023 there is ascites in perihepatic, perisplenic and pelvic cavity). Electronically Signed   By: Megan  Zare M.D.   On: 08/06/2023 16:53   US  EKG SITE RITE Result Date: 08/06/2023 If Site Rite image not attached, placement could not be confirmed due to current cardiac rhythm.   Assessment: 53 y.o. year old female  with a history of ETOH abuse, cirrhosis felt likely due to ETOH, GERD, HTN, SVT, periumbilical hernia, chronic diarrhea, GI bleed in May 2025 with findings of PUD on EGD, lost to follow-up and surveillance EGD, presenting to the ED 7/26 with abdominal pain, vomiting, and diarrhea, found to have gastric perforation and underwent exploratory laparotomy with gastrorrhaphy, omental patch, wedge liver biopsy, primary umbilical hernia repair, JP drain placement. GI  consulted yesterday for increased JP drainage and concern for ascites.   Cirrhosis:  Suspected to be secondary to ETOH.  She has worked on cutting down her daily alcohol use, but continues to drink 3-4 beer daily.  Prior serologic  evaluation in April with negative viral serologies, elevated IgG, but normal ANA, ASMA, AMA.  Underwent liver biopsy on 7/27 with surgical pathology pending.  MELD 3.0 is 23 today.   Concerns for ascites based on increased JP drainage on 7/27 and 7/28, but no significant ascites noted on ultrasound though exam obscured due to bowel gas.  Prior CT on 7/27 with free fluid, but this was in the setting of perforated gastric ulcer.  CT in May 2025 with  small volume ascites.  Encouragingly, JP drainage does seem to be slowing down. Total of 770 cc on 7/28, 500 cc on 7/28, 145 cc so far today.   Considered starting diuretics, but this is limited by hyponatremia with sodium 125.  Ultimately, if ascites continues to be a concern after JP drain is removed, can pursue paracentesis as needed.  No paracentesis needed at this time. Diuretics can be started if needed once Na has improved.   She has no hepatic encephalopathy, lower extremity edema.  Acute on chronic anemia:  Baseline hemoglobin in the 8-9 range likely secondary to known peptic ulcer disease though unable to rule out contributing colonic etiology as it does not appear that she is ever had a colonoscopy.  Hemoglobin on admission was 10.4 and declined to 7.8 on 7/28 with evidence of iron deficiency.  This decline is likely multifactorial in the setting of surgery and perforated ulcer.  Hemoglobin down to 7.1 today, but she has had no overt GI bleeding. 2 units prbcs have been ordered per hospitalist.   For now we will continue with monitoring. No role for endoscopic evaluation considering recent surgery and no overt GI bleeding. Will need outpatient colonoscopy once recovered from acute illness.   Possible liver lesion: On CT in 2022 and also earlier this year was indeterminate. Normal AFP April 2025. Can follow as outpatient.   Plan: Continue to trend H&H Agree with PRBCs ordered by hospitalist today. Monitor for overt GI bleeding Continue IV PPI twice daily Continue daily MELD labs. Further discussion was had today regarding necessity of alcohol cessation. Follow-up on surgical pathology Outpatient MRI to follow-up on indeterminate liver lesion. Outpatient colonoscopy once over acute illness.   LOS: 2 days    08/07/2023, 1:56 PM   Josette Centers, [facility]

## 2023-08-08 DIAGNOSIS — K668 Other specified disorders of peritoneum: Secondary | ICD-10-CM

## 2023-08-08 DIAGNOSIS — K420 Umbilical hernia with obstruction, without gangrene: Secondary | ICD-10-CM

## 2023-08-08 DIAGNOSIS — K7031 Alcoholic cirrhosis of liver with ascites: Secondary | ICD-10-CM

## 2023-08-08 DIAGNOSIS — K275 Chronic or unspecified peptic ulcer, site unspecified, with perforation: Secondary | ICD-10-CM | POA: Diagnosis not present

## 2023-08-08 DIAGNOSIS — E440 Moderate protein-calorie malnutrition: Secondary | ICD-10-CM

## 2023-08-08 DIAGNOSIS — D62 Acute posthemorrhagic anemia: Secondary | ICD-10-CM

## 2023-08-08 DIAGNOSIS — R198 Other specified symptoms and signs involving the digestive system and abdomen: Secondary | ICD-10-CM | POA: Diagnosis not present

## 2023-08-08 LAB — GLUCOSE, CAPILLARY
Glucose-Capillary: 113 mg/dL — ABNORMAL HIGH (ref 70–99)
Glucose-Capillary: 117 mg/dL — ABNORMAL HIGH (ref 70–99)
Glucose-Capillary: 119 mg/dL — ABNORMAL HIGH (ref 70–99)
Glucose-Capillary: 130 mg/dL — ABNORMAL HIGH (ref 70–99)
Glucose-Capillary: 81 mg/dL (ref 70–99)

## 2023-08-08 LAB — COMPREHENSIVE METABOLIC PANEL WITH GFR
ALT: 17 U/L (ref 0–44)
AST: 43 U/L — ABNORMAL HIGH (ref 15–41)
Albumin: 2.1 g/dL — ABNORMAL LOW (ref 3.5–5.0)
Alkaline Phosphatase: 46 U/L (ref 38–126)
Anion gap: 6 (ref 5–15)
BUN: 7 mg/dL (ref 6–20)
CO2: 26 mmol/L (ref 22–32)
Calcium: 7.7 mg/dL — ABNORMAL LOW (ref 8.9–10.3)
Chloride: 89 mmol/L — ABNORMAL LOW (ref 98–111)
Creatinine, Ser: <0.3 mg/dL — ABNORMAL LOW (ref 0.44–1.00)
Glucose, Bld: 100 mg/dL — ABNORMAL HIGH (ref 70–99)
Potassium: 3.4 mmol/L — ABNORMAL LOW (ref 3.5–5.1)
Sodium: 121 mmol/L — ABNORMAL LOW (ref 135–145)
Total Bilirubin: 1.4 mg/dL — ABNORMAL HIGH (ref 0.0–1.2)
Total Protein: 5.8 g/dL — ABNORMAL LOW (ref 6.5–8.1)

## 2023-08-08 LAB — TYPE AND SCREEN
ABO/RH(D): A POS
Antibody Screen: NEGATIVE
Unit division: 0
Unit division: 0
Unit division: 0
Unit division: 0

## 2023-08-08 LAB — BPAM RBC
Blood Product Expiration Date: 202508172359
Blood Product Expiration Date: 202508212359
Blood Product Expiration Date: 202508282359
Blood Product Expiration Date: 202508282359
ISSUE DATE / TIME: 202507290925
ISSUE DATE / TIME: 202507291256
Unit Type and Rh: 6200
Unit Type and Rh: 6200
Unit Type and Rh: 6200
Unit Type and Rh: 6200

## 2023-08-08 LAB — SURGICAL PATHOLOGY

## 2023-08-08 LAB — PHOSPHORUS: Phosphorus: 3.4 mg/dL (ref 2.5–4.6)

## 2023-08-08 LAB — MAGNESIUM: Magnesium: 1.5 mg/dL — ABNORMAL LOW (ref 1.7–2.4)

## 2023-08-08 MED ORDER — MAGNESIUM SULFATE 4 GM/100ML IV SOLN
4.0000 g | Freq: Once | INTRAVENOUS | Status: AC
Start: 2023-08-08 — End: 2023-08-08
  Administered 2023-08-08: 4 g via INTRAVENOUS
  Filled 2023-08-08: qty 100

## 2023-08-08 MED ORDER — INSULIN ASPART 100 UNIT/ML IJ SOLN: 0.0000 [IU] | Freq: Three times a day (TID) | INTRAMUSCULAR | Status: DC

## 2023-08-08 MED ORDER — POTASSIUM CHLORIDE 10 MEQ/100ML IV SOLN
10.0000 meq | INTRAVENOUS | Status: AC
  Administered 2023-08-08 (×4): 10 meq via INTRAVENOUS
  Filled 2023-08-08 (×4): qty 100

## 2023-08-08 MED ORDER — BISACODYL 10 MG RE SUPP
10.0000 mg | Freq: Every day | RECTAL | Status: DC
  Administered 2023-08-08 – 2023-08-12 (×5): 10 mg via RECTAL
  Filled 2023-08-08 (×8): qty 1

## 2023-08-08 MED ORDER — TRAVASOL 10 % IV SOLN
INTRAVENOUS | Status: AC
  Filled 2023-08-08: qty 810

## 2023-08-08 NOTE — Plan of Care (Signed)
  Problem: Education: Goal: Knowledge of General Education information will improve Description: Including pain rating scale, medication(s)/side effects and non-pharmacologic comfort measures Outcome: Progressing   Problem: Health Behavior/Discharge Planning: Goal: Ability to manage health-related needs will improve Outcome: Progressing   Problem: Clinical Measurements: Goal: Ability to maintain clinical measurements within normal limits will improve Outcome: Progressing Goal: Will remain free from infection Outcome: Progressing Goal: Diagnostic test results will improve Outcome: Progressing Goal: Respiratory complications will improve Outcome: Progressing Goal: Cardiovascular complication will be avoided Outcome: Progressing   Problem: Activity: Goal: Risk for activity intolerance will decrease Outcome: Progressing   Problem: Nutrition: Goal: Adequate nutrition will be maintained Outcome: Progressing   Problem: Coping: Goal: Level of anxiety will decrease Outcome: Progressing   Problem: Elimination: Goal: Will not experience complications related to bowel motility Outcome: Not Progressing Goal: Will not experience complications related to urinary retention Outcome: Progressing   Problem: Pain Managment: Goal: General experience of comfort will improve and/or be controlled Outcome: Not Progressing   Problem: Safety: Goal: Ability to remain free from injury will improve Outcome: Progressing   Problem: Skin Integrity: Goal: Risk for impaired skin integrity will decrease Outcome: Progressing

## 2023-08-08 NOTE — Progress Notes (Signed)
 3 Days Post-Op  Subjective: [name] #POD3 s/p exploratory laparotomy, gastrorrhaphy, omental patch, wedge liver biopsy, and primary repair of umbilical hernia for gastric perforation with free air, abdominal distension, and peritonitis presents with ongoing pain. Overnight, nursing staff reported that her hallucinations and disorientation still persists during the evening/night time which prompted them to put mittens on her. Nursing staff also reported that she pulled out her NG tube last night, and the the NG tube was replaced, an x-ray was conducted, and was doing fine after it was replaced. The patient reports head and abdominal pain this morning. She also reports feeling cold. Her abdominal pain and pleuritic pain persists, and is about the same as it was yesterday. Her bowel function still has not returned. She endorses abdominal pain, that is exacerbated by laughing, sneezing, and coughing. She denies nausea and vomiting.   Objective: Vital signs in last 24 hours: Temp:  [97.9 F (36.6 C)-100.5 F (38.1 C)] 98.6 F (37 C) (07/30 0731) Pulse Rate:  [90-120] 101 (07/30 0700) Resp:  [10-25] 15 (07/30 0700) BP: (117-159)/(78-98) 144/94 (07/30 0700) SpO2:  [91 %-100 %] 97 % (07/30 0700) Weight:  [61.7 kg] 61.7 kg (07/30 0422)    Intake/Output from previous day: 07/29 0701 - 07/30 0700 In: 2735.7 [I.V.:1652.8; Blood:652.5; IV Piggyback:430.4] Out: 2690 [Urine:1715; Emesis/NG output:400; Drains:575] Intake/Output this shift: Total I/O In: -  Out: 300 [Urine:300]  Physical Exam Constitutional:      General: She is not in acute distress.    Appearance: She is normal weight. She is not ill-appearing or toxic-appearing.  HENT:     Head: Normocephalic and atraumatic.  Cardiovascular:     Rate and Rhythm: Normal rate and regular rhythm.     Heart sounds: Normal heart sounds. No murmur heard.    No friction rub. No gallop.  Pulmonary:     Effort: Pulmonary  effort is normal. No respiratory distress.     Breath sounds: Normal breath sounds. No wheezing, rhonchi or rales.  Abdominal:     General: Abdomen is flat. Bowel sounds are decreased. There is no distension.     Palpations: Abdomen is soft.     Tenderness: There is abdominal tenderness in the right upper quadrant, right lower quadrant, epigastric area, periumbilical area and left lower quadrant.     Hernia: No hernia is present.     Comments: Surgical incision is clean, dry, and intact. No erythema, drainage, or signs of infection. Bandage is dry.   Skin:    General: Skin is warm and dry.     Coloration: Skin is not cyanotic or jaundiced.  Neurological:     Mental Status: She is alert.  Psychiatric:        Mood and Affect: Mood normal.        Behavior: Behavior normal.    Lab Results:  Recent Labs    08/06/23 0419 08/07/23 0302 08/07/23 1927  WBC 5.4 5.0  --   HGB 7.8* 7.1* 10.3*  HCT 24.4* 22.7* 30.9*  PLT 76* 76*  --    BMET Recent Labs    08/07/23 0302 08/08/23 0423  NA 125* 121*  K 3.9 3.4*  CL 95* 89*  CO2 25 26  GLUCOSE 107* 100*  BUN 6 7  CREATININE 0.49 <0.30*  CALCIUM  7.8* 7.7*   PT/INR Recent Labs    08/06/23 1201 08/07/23 0302  LABPROT 19.2* 18.3*  INR 1.5* 1.4*    Studies/Results: DG Abd  1 View Result Date: 08/07/2023 CLINICAL DATA:  Nasogastric tube EXAM: ABDOMEN - 1 VIEW COMPARISON:  Abdominal x-ray 08/05/2023 FINDINGS: Nasogastric tube tip at the level of the gastric antrum. Surgical drain and skin staples are again seen in the mid abdomen. Right upper extremity PICC terminates over the distal SVC. The lungs appear clear. There is no pneumothorax. The cardiomediastinal silhouette is within normal limits. IMPRESSION: Nasogastric tube tip at the level of the gastric antrum. Electronically Signed   By: Greig Pique M.D.   On: 08/07/2023 22:29   US  Abdomen Limited Result Date: 08/06/2023 CLINICAL DATA:  Evaluate for ascites. EXAM: LIMITED ABDOMEN  ULTRASOUND FOR ASCITES TECHNIQUE: Limited ultrasound survey for ascites was performed in all four abdominal quadrants. COMPARISON:  CT abdomen August 05, 2023 FINDINGS: No free fluid within the limited images provided in 4 quadrants. IMPRESSION: No ascites identified on current exam and may be obscured by bowel gas (on recent CT from 08/05/2023 there is ascites in perihepatic, perisplenic and pelvic cavity). Electronically Signed   By: Megan  Zare M.D.   On: 08/06/2023 16:53   US  EKG SITE RITE Result Date: 08/06/2023 If Site Rite image not attached, placement could not be confirmed due to current cardiac rhythm.   Anti-infectives: Anti-infectives (From admission, onward)    Start     Dose/Rate Route Frequency Ordered Stop   08/05/23 1000  piperacillin -tazobactam (ZOSYN ) IVPB 3.375 g        3.375 g 12.5 mL/hr over 240 Minutes Intravenous Every 8 hours 08/05/23 0531 08/10/23 1359   08/05/23 0630  fluconazole  (DIFLUCAN ) IVPB 400 mg        400 mg 100 mL/hr over 120 Minutes Intravenous Every 24 hours 08/05/23 0533 08/10/23 1059   08/05/23 0600  cefoTEtan  (CEFOTAN ) 2 g in sodium chloride  0.9 % 100 mL IVPB        2 g 200 mL/hr over 30 Minutes Intravenous On call to O.R. 08/05/23 0138 08/05/23 0427   08/05/23 0253  sodium chloride  0.9 % with cefoTEtan  (CEFOTAN ) ADS Med       Note to Pharmacy: Joshua Planas S: cabinet override      08/05/23 0253 08/05/23 0359   08/05/23 0115  piperacillin -tazobactam (ZOSYN ) IVPB 3.375 g        3.375 g 100 mL/hr over 30 Minutes Intravenous  Once 08/05/23 0112 08/05/23 0155       Assessment/Plan: s/p Procedure(s): GASTRORRHAPHY LAPAROTOMY, EXPLORATORY BIOPSY, LIVER REPAIR, HERNIA, UMBILICAL, ADULT  Assessment: [name] #POD3 s/p exploratory laparotomy, gastrorrhaphy, omental patch, wedge liver biopsy, and primary repair of umbilical hernia for gastric perforation, with recurrent even time/night time episodes of confusion and  disorientation, increased JP drain output, upward trending H/H levels post RBC infusion, post-op abdominal pain and pleuritic pain, and no return of bowel function. Patient is doing fair overall within clinical context.   Plan:  - Continue NPO, NG tube, and ice chips for comfort and water sips w/ medications. - Avoid the use of NSAIDs.  - Continue to promote ambulation. - Continue PPI BID. - Continue Zosyn  and Diflucan  until 7/31 (per the STOP it trial). - Continue to trend labs to monitor hemodynamic stability. - Appreciate GI Involvement; will continue to defer management of potential ascites, hyponatremia, and other electrolyte abnormalities to GI, given underlying alcoholic related cirrhosis.   - JP drain output still remains elevated; will continue to monitor closely and collaborate with GI regarding evaluation and management. - Upper GI Series re-scheduled  for Friday due to radiology being unavailable on Thursday.   - Continue TPN via PICC line.  - Continue to hold prophylaxis for now given cirrhosis and recent surgery.  - Continue the use of SCDs to promote circulation and prevent blood loss.  - Monitor mental status closely for nocturnal confusion/disorientation, likely multifactorial (cirrhosis, hyponatremia).   LOS: 3 days    Dwane GORMAN Glatter 08/08/2023

## 2023-08-08 NOTE — Progress Notes (Signed)
 PROGRESS NOTE   Joan Wood  FMW:[id number] DOB: July 21, 1970 DOA: 08/04/2023 PCP: Zarwolo, Gloria, FNP   Chief Complaint  Patient presents with   Abdominal Pain   Level of care: Stepdown  Brief Admission History:  Joan Wood is a 53 y.o. female with medical history significant of anxiety, GERD, cirrhosis, hypertension who presents emergency department due to abdominal pain.  On arrival she was afebrile and hemodynamically stable, labs showed sodium 122, potassium 3.3, creatinine 0.57, WBC 5.5, hemoglobin 10.4, INR 1.3.  Patient CT abdomen pelvis which showed perforated gastric ulcer.  Surgery was consulted and she was taken to the OR for exploratory laparotomy, omental patch and hernia repair on 08/05/23.  She was admitted to the ICU.  She is on TNA.    Assessment and Plan:  Perforated gastric ulcer - Treated surgically Gastric ulcer perforation with liver cirrhosis, umbilical hernia with incarcerated omental and recanalized umbilical vein  Postop day #3 -S/p exploratory laparotomy: gastrorrhaphy, omental patch, liver biopsy, primary umbilical hernia repair completed  -NG tube in place-draining -Continue ice chips for comfort - JP drain in place  -Zosyn , and diflucan  -Protonix  40 mg BID - IV muscle relaxants as needed  Foley catheter, postop-removed Holding anticoagulation at this time  - TPN initiated per surgery - DC IVF   Hypotensive -- RESOLVED  - Postop blood pressure remained low-blood pressure improved - midodrine  discontinued  - Did not require pressors    Decompensated alcoholic cirrhosis -with ascites -trend LFTs closely monitor for peritonitis -No signs of alcohol withdrawal -Patient does not want her family to know about her diagnosis alcoholic liver cirrhosis -GI consult appreciated    Hyponatremia -Serum sodium: 126>>  128 >> 125 >>121 -Secondary to liver cirrhosis with chronic alcohol abuse likely related to alcohol intake.   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, and malnutrition -Continue TPN - Anticipating improvement in sodium level when able to eat again orally   Hypomagnesemia -- IV replacement ordered  -- recheck in AM with TPN labs   Acute on chronic anemia -iron deficiency - Acute on chronic anemia with iron deficiency, liver cirrhosis, progressive decline postop hemoglobin -symptomatic with hypotension -Hemoglobin dropping from 10.4 >> 7.8 >> 7.1 - With hypotension-  give 2U PRBC blood transfusion -   Iron/TIBC/Ferritin/ %Sat Labs (Brief)          Component Value Date/Time    IRON 8 (L) 08/06/2023 0419    IRON 73 04/11/2023 1455    TIBC 310 08/06/2023 0419    TIBC 373 04/11/2023 1455    FERRITIN 266 05/12/2023 0930    FERRITIN 165 (H) 04/11/2023 1455    IRONPCTSAT 3 (L) 08/06/2023 0419    IRONPCTSAT 20 04/11/2023 1455    B12 at 757, folate at 6.5   -08/07/2023 2U PRBC blood transfusion -GI has been consulted for liver cirrhosis and ascites appreciate input regarding anemia  DVT prophylaxis: SCDs Code Status: Full  Family Communication:  Disposition:TBD    Consultants:  Surgery Pharmacy   Procedures:  08/05/23 Exp lap, omental patch, liver biopsy, umbilical hernia repair    Subjective: Pt reports having some episodes of ICU delirium.    Objective: Vitals:   08/08/23 0800 08/08/23 1025 08/08/23 1126 08/08/23 1200  BP: (!) 155/99 123/68  (!) 130/96  Pulse:  93  (!) 101  Resp:  12  (!) 23  Temp:   98.5 F (36.9 C)   TempSrc:   Oral  SpO2:  97%  95%  Weight:      Height:        Intake/Output Summary (Last 24 hours) at 08/08/2023 1529 Last data filed at 08/08/2023 1441 Gross per 24 hour  Intake 1707.71 ml  Output 3090 ml  Net -1382.29 ml   Filed Weights   08/04/23 2317 08/07/23 0500 08/08/23 0422  Weight: 54.4 kg 58 kg 61.7 kg   Examination:  General exam: Appears calm and comfortable  Respiratory system: Clear to auscultation.  Respiratory effort normal. Cardiovascular system: normal S1 & S2 heard. No JVD, murmurs, rubs, gallops or clicks. No pedal edema. Gastrointestinal system: Abdomen is nondistended, soft and nontender. No organomegaly or masses felt. Normal bowel sounds heard. Central nervous system: Alert and oriented. No focal neurological deficits. Extremities: Symmetric 5 x 5 power. Skin: No rashes, lesions or ulcers. Psychiatry: Judgement and insight appear normal. Mood & affect appropriate.   Data Reviewed: I have personally reviewed following labs and imaging studies  CBC: Recent Labs  Lab 08/04/23 2319 08/06/23 0419 08/07/23 0302 08/07/23 1927  WBC 5.5 5.4 5.0  --   HGB 10.4* 7.8* 7.1* 10.3*  HCT 31.2* 24.4* 22.7* 30.9*  MCV 89.9 92.8 93.4  --   PLT 123* 76* 76*  --     Basic Metabolic Panel: Recent Labs  Lab 08/05/23 1751 08/06/23 0034 08/06/23 0419 08/07/23 0302 08/08/23 0423  NA 127* 129* 128* 125* 121*  K 4.3 4.4 4.2 3.9 3.4*  CL 100 99 100 95* 89*  CO2 18* 21* 23 25 26   GLUCOSE 132* 125* 116* 107* 100*  BUN 9 10 10 6 7   CREATININE 0.55 0.46 0.44 0.49 <0.30*  CALCIUM  7.7* 8.0* 7.8* 7.8* 7.7*  MG  --   --  1.7 1.6* 1.5*  PHOS  --   --  2.0* 2.6 3.4    CBG: Recent Labs  Lab 08/07/23 1810 08/07/23 2004 08/08/23 0001 08/08/23 0618 08/08/23 1124  GLUCAP 97 101* 119* 117* 130*    Recent Results (from the past 240 hours)  Aerobic/Anaerobic Culture w Gram Stain (surgical/deep wound)     Status: None (Preliminary result)   Collection Time: 08/05/23  4:56 AM   Specimen: Path fluid; Body Fluid  Result Value Ref Range Status   Specimen Description   Final    FLUID Performed at [facility], [address] [place], [name] [address]    Special Requests   Final    NONE Performed at [facility], [address] [place], [name] [address]    Gram Stain   Final    RARE WBC SEEN RARE GRAM POSITIVE COCCI RARE GRAM NEGATIVE RODS Performed at [facility], [address] [place], [name] [address]    Culture   Final    FEW MULTIPLE ORGANISMS PRESENT, NONE PREDOMINANT NO GROUP A STREP (S.PYOGENES) ISOLATED NO STAPHYLOCOCCUS AUREUS ISOLATED NO ANAEROBES ISOLATED; CULTURE IN PROGRESS FOR 5 DAYS    Report Status PENDING  Incomplete  MRSA Next Gen by PCR, Nasal     Status: None   Collection Time: 08/05/23  7:53 AM   Specimen: Nasal Mucosa; Nasal Swab  Result Value Ref Range Status   MRSA by PCR Next Gen NOT DETECTED NOT DETECTED Final    Comment: (NOTE) The GeneXpert MRSA Assay (FDA approved for NASAL specimens only), is one component of a comprehensive MRSA colonization surveillance program. It is not intended to diagnose MRSA infection nor to guide or monitor treatment for MRSA  infections. Test performance is not FDA approved in patients less than 7 years old. Performed at [facility], [address], [place], [name] [address]      Radiology Studies: DG Abd 1 View Result Date: 08/07/2023 CLINICAL DATA:  Nasogastric tube EXAM: ABDOMEN - 1 VIEW COMPARISON:  Abdominal x-ray 08/05/2023 FINDINGS: Nasogastric tube tip at the level of the gastric antrum. Surgical drain and skin staples are again seen in the mid abdomen. Right upper extremity PICC terminates over the distal SVC. The lungs appear clear. There is no pneumothorax. The cardiomediastinal silhouette is within normal limits. IMPRESSION: Nasogastric tube tip at the level of the gastric antrum. Electronically Signed   By: Greig Pique M.D.   On: 08/07/2023 22:29    Scheduled Meds:  bisacodyl   10 mg Rectal Daily   chlorhexidine   15 mL Mouth/Throat Once   Chlorhexidine  Gluconate Cloth  6 each Topical Daily   folic acid   1 mg Oral Daily   insulin  aspart  0-9 Units Subcutaneous Q8H   nicotine   14 mg Transdermal Daily   pantoprazole  (PROTONIX ) IV  40 mg Intravenous Q12H   sertraline   25 mg Oral Daily   sodium chloride  flush  10-40 mL Intracatheter Q12H   sucralfate   1 g Oral TID WC & HS    Continuous Infusions:  fluconazole  (DIFLUCAN ) IV 400 mg (08/08/23 1137)   piperacillin -tazobactam (ZOSYN )  IV 3.375 g (08/08/23 1401)   potassium chloride  10 mEq (08/08/23 1402)   TPN ADULT (ION) 75 mL/hr at 08/08/23 1111   TPN ADULT (ION)       LOS: 3 days   Critical Care Procedure Note Authorized and Performed by: KYM Louder MD  Total Critical Care time:  62 mins Due to a high probability of clinically significant, life threatening deterioration, the patient required my highest level of preparedness to intervene emergently and I personally spent this critical care time directly and personally managing the patient.  This critical care time included obtaining a history; examining the patient, pulse oximetry; ordering and review of studies; arranging urgent treatment with development of a management plan; evaluation of patient's response of treatment; frequent reassessment; and discussions with other providers.  This critical care time was performed to assess and manage the high probability of imminent and life threatening deterioration that could result in multi-organ failure.  It was exclusive of separately billable procedures and treating other patients and teaching time.    Afton Louder, MD How to contact the [facility] Attending or Consulting provider 7A - 7P or covering provider during after hours 7P -7A, for this patient?  Check the care team in [facility] and look for a) attending/consulting [facility] provider listed and b) the [facility] team listed Log into [email or web address] to find provider on call.  Locate the [facility] provider you are looking for under [facility] and page to a number that you can be directly reached. If you still have difficulty reaching the provider, please page the [facility] (Director on Call) for the Hospitalists listed on amion for assistance.  08/08/2023, 3:29 PM

## 2023-08-08 NOTE — Plan of Care (Signed)
  Problem: Nutrition: Goal: Adequate nutrition will be maintained Outcome: Not Progressing   Problem: Coping: Goal: Level of anxiety will decrease Outcome: Not Progressing   Problem: Elimination: Goal: Will not experience complications related to urinary retention Outcome: Progressing

## 2023-08-08 NOTE — Progress Notes (Signed)
 Subjective: Pt A/O x4 today. Complains of sore throat today. She is having some recurrent upper abdominal pain that radiates down on both sides and into her umbilicus area. Soreness around surgical site. No BMs today. No passage of flatus or belching. She is having nausea.   Objective: Vital signs in last 24 hours: Temp:  [97.9 F (36.6 C)-100.5 F (38.1 C)] 98.6 F (37 C) (07/30 0731) Pulse Rate:  [90-120] 101 (07/30 0700) Resp:  [10-25] 15 (07/30 0700) BP: (117-159)/(78-99) 155/99 (07/30 0800) SpO2:  [91 %-97 %] 97 % (07/30 0700) Weight:  [61.7 kg] 61.7 kg (07/30 0422)   General:   Alert and oriented, pleasant Head:  Normocephalic and atraumatic. Eyes:  No icterus, sclera clear. Conjuctiva pink.  Mouth:  Without lesions, mucosa pink and moist.  Heart:  S1, S2 present, no murmurs noted.  Lungs: Clear to auscultation bilaterally, without wheezing, rales, or rhonchi.  Abdomen:  Bowel sounds present but hypoactive. TTP of generalized abdomen, surgical dressing in place to umbilical region, mildly distended but soft. No HSM or hernias noted. No rebound or guarding. No masses appreciated  Msk:  Symmetrical without gross deformities. Normal posture. Extremities:  Without clubbing or edema. Neurologic:  Alert and  oriented x4;  grossly normal neurologically. No asterixis  Skin:  Warm and dry, intact without significant lesions.  Psych:  Alert and cooperative. Normal mood and affect.  Intake/Output from previous day: 07/29 0701 - 07/30 0700 In: 2735.7 [I.V.:1652.8; Blood:652.5; IV Piggyback:430.4] Out: 2690 [Urine:1715; Emesis/NG output:400; Drains:575] Intake/Output this shift: Total I/O In: 10 [I.V.:10] Out: 640 [Urine:550; Drains:90]  Lab Results: Recent Labs    08/06/23 0419 08/07/23 0302 08/07/23 1927  WBC 5.4 5.0  --   HGB 7.8* 7.1* 10.3*  HCT 24.4* 22.7* 30.9*  PLT 76* 76*  --    BMET Recent Labs    08/06/23 0419 08/07/23 0302 08/08/23 0423  NA 128* 125* 121*   K 4.2 3.9 3.4*  CL 100 95* 89*  CO2 23 25 26   GLUCOSE 116* 107* 100*  BUN 10 6 7   CREATININE 0.44 0.49 <0.30*  CALCIUM  7.8* 7.8* 7.7*   LFT Recent Labs    08/06/23 0419 08/07/23 0302 08/08/23 0423  PROT 5.8* 5.5* 5.8*  ALBUMIN 2.2* 1.9* 2.1*  AST 73* 54* 43*  ALT 23 20 17   ALKPHOS 42 39 46  BILITOT 1.4* 0.8 1.4*   PT/INR Recent Labs    08/06/23 1201 08/07/23 0302  LABPROT 19.2* 18.3*  INR 1.5* 1.4*    Studies/Results: DG Abd 1 View Result Date: 08/07/2023 CLINICAL DATA:  Nasogastric tube EXAM: ABDOMEN - 1 VIEW COMPARISON:  Abdominal x-ray 08/05/2023 FINDINGS: Nasogastric tube tip at the level of the gastric antrum. Surgical drain and skin staples are again seen in the mid abdomen. Right upper extremity PICC terminates over the distal SVC. The lungs appear clear. There is no pneumothorax. The cardiomediastinal silhouette is within normal limits. IMPRESSION: Nasogastric tube tip at the level of the gastric antrum. Electronically Signed   By: Greig Pique M.D.   On: 08/07/2023 22:29   US  Abdomen Limited Result Date: 08/06/2023 CLINICAL DATA:  Evaluate for ascites. EXAM: LIMITED ABDOMEN ULTRASOUND FOR ASCITES TECHNIQUE: Limited ultrasound survey for ascites was performed in all four abdominal quadrants. COMPARISON:  CT abdomen August 05, 2023 FINDINGS: No free fluid within the limited images provided in 4 quadrants. IMPRESSION: No ascites identified on current exam and may be obscured by bowel gas (on recent CT from 08/05/2023 there  is ascites in perihepatic, perisplenic and pelvic cavity). Electronically Signed   By: Megan  Zare M.D.   On: 08/06/2023 16:53   US  EKG SITE RITE Result Date: 08/06/2023 If Site Rite image not attached, placement could not be confirmed due to current cardiac rhythm.   Assessment: Joan Wood is a 53 year old female with history of ETOH abuse, cirrhosis felt likely due to ETOH, GERD, HTN, SVT, periumbilical hernia, chronic diarrhea, GI bleed in  May 2025 with findings of PUD on EGD, lost to follow-up for surveillance EGD, presenting to the ED 7/26 with abdominal pain, vomiting, and diarrhea, found to have gastric perforation for which she underwent exploratory laparotomy with gastrorrhaphy, omental patch, wedge liver biopsy, primary umbilical hernia repair, JP drain placement. GI consulted Monday for increased JP drainage and concern for ascites   Cirrhosis: - Secondary to EtOH - Patient has worked on cutting down on daily alcohol use but continues to drink 3-4 beers daily - Previous serological evaluation in April with negative viral serologies, elevated IgG but normal ANA, ASMA, AMA - Liver biopsy on 7/27 surgical pathology pending - MELD 3.0 23   -Concerns for ascites due to increased drainage on 7/27 and 7/28 - No significant ascites noted on ultrasound on 7/28 - Prior CT on 7/27 with free fluid, this was in setting of perforated gastric ulcer - Reassuringly JP drainage has decreased (90 cc so far today, vs. 575 cc yesterday) - Mildly distended but soft abdomen, No signs of encephalopathy or lower extremity edema - Consider diagnostic paracentesis pending clinical course -no diuretics at this time due to hyponatremia, hypokalemia, though may consider low dose lasix/spironolactone with correction of electrolytes  Acute on chronic anemia: - Baseline hemoglobin 8-9 range, likely due to known peptic ulcer disease - No previous colonoscopy, therefore cannot rule out colonic etiology - Hemoglobin admission was 10.4, down to 7.1 yesterday, improved to 10.3 status post 2 units packed red blood cells - Likely multifactorial in setting of recent surgery and perforated ulcer - Will need outpatient colonoscopy once recovered from acute illness  Possible liver lesion: - Noted on CT in 2022, also earlier this year on imaging which was indeterminate - AFP in April 2025 was normal - Will need outpatient MRI for further  evaluation   Plan: - Trend H&H -Monitor for overt bleeding -PPI twice daily -Daily MELD labs -Alcohol cessation -Follow-up on surgical pathology -OutPatient colonoscopy -Outpatient MRI for liver lesion -consider addition of low dose diuretics with correction of sodium/potassium     LOS: 3 days    08/08/2023, 9:22 AM   Clotile Whittington L. Rosela Supak, MSN, APRN, AGNP-C Adult-Gerontology Nurse Practitioner [facility]

## 2023-08-08 NOTE — Progress Notes (Signed)
 PHARMACY - TOTAL PARENTERAL NUTRITION CONSULT NOTE   Indication: bowel perforation  Patient Measurements: Height: 5' 2 (157.5 cm) Weight: 61.7 kg (136 lb 0.4 oz) IBW/kg (Calculated) : 50.1 TPN AdjBW (KG): 54.4 Body mass index is 24.88 kg/m.  Assessment:  53 yo female admitted with perforated gastric ulcer in the setting of cirrhosis and umbilical hernia.   Glucose / Insulin : 101-117 , no insulin  Electrolytes: Na 126>128> 125> 121 Phos 2.0> 2.6> 3.4 Mag 1.6> 1.5 Renal: WNL Hepatic: Tbili 1.4   Albumin 2.2 Intake / Output;  I/O 2735> 2490 (+246) D/C MIVF per MD with TPN at 28mls/hr  GI Imaging/ Procedures:  7/27:  ex lap, gastrorrhaphy, omental patch, liver biopsy, and umbilical hernia repair.  Central access: PICC placement 7/28 TPN start date: 7/28  Nutritional Goals: TPN will provide Protein 81g, CHO, 979KCal, Fat, 360 Kcal for total of 1663kcal/day Patient will meet greater than or equal to 90% of their needs   RD Assessment:  Ideal Body Weight:  50 kg  BMI:  Body mass index is 21.95 kg/m.  Estimated Nutritional Needs:   Kcal:  1600-1800  Protein:  75-90 gm  Fluid:  1.6-1.8 L   Current Nutrition:  NPO  Plan:  Magnesium  Sulfate 4gm IV now KCL IV 10meq IV x 4 runs Continue  TPN at 75 mL/hr at 1800 Electrolytes in TPN: Na 41mEq/L, K 50mEq/L, Ca 7mEq/L, Mg 59mEq/L, and Phos 15mmol/L. Cl:Ac 1:1 Add standard MVI and trace elements to TPN Initiate Sensitive q8h SSI and adjust as needed  Monitor TPN labs on Mon/Thurs  Cherlyn Boers, BS Pharm D, BCPS Clinical Pharmacist 08/08/2023 8:15 AM

## 2023-08-09 DIAGNOSIS — K420 Umbilical hernia with obstruction, without gangrene: Secondary | ICD-10-CM | POA: Diagnosis not present

## 2023-08-09 DIAGNOSIS — K429 Umbilical hernia without obstruction or gangrene: Secondary | ICD-10-CM | POA: Diagnosis not present

## 2023-08-09 DIAGNOSIS — K7581 Nonalcoholic steatohepatitis (NASH): Secondary | ICD-10-CM | POA: Diagnosis not present

## 2023-08-09 DIAGNOSIS — E440 Moderate protein-calorie malnutrition: Secondary | ICD-10-CM | POA: Diagnosis not present

## 2023-08-09 DIAGNOSIS — K255 Chronic or unspecified gastric ulcer with perforation: Secondary | ICD-10-CM | POA: Diagnosis not present

## 2023-08-09 DIAGNOSIS — K275 Chronic or unspecified peptic ulcer, site unspecified, with perforation: Secondary | ICD-10-CM | POA: Diagnosis not present

## 2023-08-09 DIAGNOSIS — R198 Other specified symptoms and signs involving the digestive system and abdomen: Secondary | ICD-10-CM | POA: Diagnosis not present

## 2023-08-09 DIAGNOSIS — Z791 Long term (current) use of non-steroidal anti-inflammatories (NSAID): Secondary | ICD-10-CM

## 2023-08-09 DIAGNOSIS — K7031 Alcoholic cirrhosis of liver with ascites: Secondary | ICD-10-CM | POA: Diagnosis not present

## 2023-08-09 DIAGNOSIS — K668 Other specified disorders of peritoneum: Secondary | ICD-10-CM | POA: Diagnosis not present

## 2023-08-09 DIAGNOSIS — K7460 Unspecified cirrhosis of liver: Secondary | ICD-10-CM | POA: Diagnosis not present

## 2023-08-09 LAB — GLUCOSE, CAPILLARY
Glucose-Capillary: 117 mg/dL — ABNORMAL HIGH (ref 70–99)
Glucose-Capillary: 119 mg/dL — ABNORMAL HIGH (ref 70–99)
Glucose-Capillary: 104 mg/dL — ABNORMAL HIGH (ref 70–99)

## 2023-08-09 LAB — COMPREHENSIVE METABOLIC PANEL WITH GFR
ALT: 15 U/L (ref 0–44)
AST: 32 U/L (ref 15–41)
Albumin: 1.8 g/dL — ABNORMAL LOW (ref 3.5–5.0)
Alkaline Phosphatase: 45 U/L (ref 38–126)
Anion gap: 6 (ref 5–15)
BUN: 10 mg/dL (ref 6–20)
CO2: 25 mmol/L (ref 22–32)
Calcium: 7.8 mg/dL — ABNORMAL LOW (ref 8.9–10.3)
Chloride: 94 mmol/L — ABNORMAL LOW (ref 98–111)
Creatinine, Ser: 0.36 mg/dL — ABNORMAL LOW (ref 0.44–1.00)
GFR, Estimated: >60 mL/min (ref >60)
Glucose, Bld: 105 mg/dL — ABNORMAL HIGH (ref 70–99)
Potassium: 3.7 mmol/L (ref 3.5–5.1)
Sodium: 125 mmol/L — ABNORMAL LOW (ref 135–145)
Total Bilirubin: 1.3 mg/dL — ABNORMAL HIGH (ref 0.0–1.2)
Total Protein: 5.3 g/dL — ABNORMAL LOW (ref 6.5–8.1)

## 2023-08-09 LAB — BODY FLUID CELL COUNT WITH DIFFERENTIAL
Eos, Fluid: 1 %
Lymphs, Fluid: 17 %
Monocyte-Macrophage-Serous Fluid: 30 % — ABNORMAL LOW (ref 50–90)
Neutrophil Count, Fluid: 52 % — ABNORMAL HIGH (ref 0–25)
Total Nucleated Cell Count, Fluid: 1616 uL — ABNORMAL HIGH (ref 0–1000)

## 2023-08-09 LAB — CBC
HCT: 30.2 % — ABNORMAL LOW (ref 36.0–46.0)
Hemoglobin: 10.3 g/dL — ABNORMAL LOW (ref 12.0–15.0)
MCH: 29.9 pg (ref 26.0–34.0)
MCHC: 34.1 g/dL (ref 30.0–36.0)
MCV: 87.5 fL (ref 80.0–100.0)
Platelets: 81 K/uL — ABNORMAL LOW (ref 150–400)
RBC: 3.45 MIL/uL — ABNORMAL LOW (ref 3.87–5.11)
RDW: 17.1 % — ABNORMAL HIGH (ref 11.5–15.5)
WBC: 5.5 K/uL (ref 4.0–10.5)
nRBC: 0 % (ref 0.0–0.2)

## 2023-08-09 LAB — AMYLASE: Amylase: 123 U/L — ABNORMAL HIGH (ref 28–100)

## 2023-08-09 LAB — MAGNESIUM: Magnesium: 1.7 mg/dL (ref 1.7–2.4)

## 2023-08-09 LAB — PHOSPHORUS: Phosphorus: 3.8 mg/dL (ref 2.5–4.6)

## 2023-08-09 LAB — LACTATE DEHYDROGENASE: LDH: 159 U/L (ref 98–192)

## 2023-08-09 LAB — ALBUMIN: Albumin: 2 g/dL — ABNORMAL LOW (ref 3.5–5.0)

## 2023-08-09 LAB — PROTEIN, TOTAL: Total Protein: 5.7 g/dL — ABNORMAL LOW (ref 6.5–8.1)

## 2023-08-09 LAB — PROTIME-INR
INR: 1.4 — ABNORMAL HIGH (ref 0.8–1.2)
Prothrombin Time: 17.4 s — ABNORMAL HIGH (ref 11.4–15.2)

## 2023-08-09 LAB — ALBUMIN, PLEURAL OR PERITONEAL FLUID: Albumin, Fluid: <1.5 g/dL

## 2023-08-09 MED ORDER — MAGNESIUM SULFATE 4 GM/100ML IV SOLN
4.0000 g | Freq: Once | INTRAVENOUS | Status: AC
  Administered 2023-08-09: 4 g via INTRAVENOUS
  Filled 2023-08-09: qty 100

## 2023-08-09 MED ORDER — POTASSIUM CHLORIDE 10 MEQ/100ML IV SOLN
10.0000 meq | INTRAVENOUS | Status: AC
  Administered 2023-08-09 (×3): 10 meq via INTRAVENOUS
  Filled 2023-08-09 (×3): qty 100

## 2023-08-09 MED ORDER — TRAVASOL 10 % IV SOLN
INTRAVENOUS | Status: AC
  Filled 2023-08-09: qty 810

## 2023-08-09 NOTE — Progress Notes (Signed)
 PROGRESS NOTE   Joan Wood  FMW:[id number] DOB: Nov 26, 1970 DOA: 08/04/2023 PCP: Zarwolo, Gloria, FNP   Chief Complaint  Patient presents with   Abdominal Pain   Level of care: Stepdown  Brief Admission History:  Joan Wood is a 53 y.o. female with medical history significant of anxiety, GERD, cirrhosis, hypertension who presents emergency department due to abdominal pain.  On arrival she was afebrile and hemodynamically stable, labs showed sodium 122, potassium 3.3, creatinine 0.57, WBC 5.5, hemoglobin 10.4, INR 1.3.  Patient CT abdomen pelvis which showed perforated gastric ulcer.  Surgery was consulted and she was taken to the OR for exploratory laparotomy, omental patch and hernia repair on 08/05/23.  She was admitted to the ICU.  She is on TNA.    Assessment and Plan:  Perforated gastric ulcer - Treated surgically Gastric ulcer perforation with liver cirrhosis, umbilical hernia with incarcerated omental and recanalized umbilical vein  Postop day #4 -S/p exploratory laparotomy: gastrorrhaphy, omental patch, liver biopsy, primary umbilical hernia repair completed  -NG tube in place-draining -Continue ice chips for comfort - JP drain in place with serous output - surgery team planning UGI study tomorrow per Dr. Kallie  -Zosyn , and diflucan  completing today  -Protonix  40 mg BID - IV muscle relaxants as needed  Foley catheter, postop-removed Holding chemical anticoagulation at this time  - TPN per surgery and pharm D - DC IVF   Hypotensive -- RESOLVED  - Postop blood pressure remained low-blood pressure improved - midodrine  discontinued  - Did not require pressors    Decompensated alcoholic cirrhosis -with ascites -trend LFTs closely monitor for peritonitis -No signs of alcohol withdrawal -Patient does not want her family to know about her diagnosis alcoholic liver cirrhosis -GI consult appreciated    Hyponatremia -Serum sodium: 126>>  128 >> 125 >>121 -Secondary  to liver cirrhosis with chronic alcohol abuse likely related to alcohol intake.   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, and malnutrition -Continue TPN - Anticipating improvement in sodium level when able to eat again orally   Hypomagnesemia -- IV replacement ordered  -- recheck in AM with TPN labs   Acute on chronic anemia -iron deficiency - Acute on chronic anemia with iron deficiency, liver cirrhosis, progressive decline postop hemoglobin -symptomatic with hypotension -Hemoglobin dropping from 10.4 >> 7.8 >> 7.1 - With hypotension-  give 2U PRBC blood transfusion -   Iron/TIBC/Ferritin/ %Sat Labs (Brief)          Component Value Date/Time    IRON 8 (L) 08/06/2023 0419    IRON 73 04/11/2023 1455    TIBC 310 08/06/2023 0419    TIBC 373 04/11/2023 1455    FERRITIN 266 05/12/2023 0930    FERRITIN 165 (H) 04/11/2023 1455    IRONPCTSAT 3 (L) 08/06/2023 0419    IRONPCTSAT 20 04/11/2023 1455    B12 at 757, folate at 6.5   -08/07/2023 2U PRBC blood transfusion -GI has been consulted for liver cirrhosis and ascites appreciate input regarding anemia  DVT prophylaxis: SCDs Code Status: Full  Family Communication:  Disposition:TBD    Consultants:  Surgery Pharmacy   Procedures:  08/05/23 Exp lap, omental patch, liver biopsy, umbilical hernia repair    Subjective: Pt says she can feel that she is getting better, abd pain is more intermittent now and not constant.   Objective: Vitals:   08/09/23 0527 08/09/23 0600 08/09/23 0801 08/09/23 1139  BP:  128/78 139/88   Pulse:  90 92 93   Resp: 11 10 12    Temp:   98.6 F (37 C) 98.1 F (36.7 C)  TempSrc:   Oral Oral  SpO2: 95% 94% 94%   Weight: 60.1 kg     Height:        Intake/Output Summary (Last 24 hours) at 08/09/2023 1341 Last data filed at 08/09/2023 1249 Gross per 24 hour  Intake 2952.91 ml  Output 3795 ml  Net -842.09 ml   Filed Weights   08/07/23 0500 08/08/23  0422 08/09/23 0527  Weight: 58 kg 61.7 kg 60.1 kg   Examination:  General exam: Appears calm and comfortable, NG tube with TPN going.   Respiratory system: Clear to auscultation. Respiratory effort normal. Cardiovascular system: normal S1 & S2 heard. No JVD, murmurs, rubs, gallops or clicks. No pedal edema. Gastrointestinal system: JP drain in place, Abdomen is nondistended, soft and tender. No organomegaly or masses felt. Normal bowel sounds heard. Central nervous system: Alert and oriented. No focal neurological deficits. Extremities: Symmetric 5 x 5 power. Skin: No rashes, lesions or ulcers. Psychiatry: Judgement and insight appear normal. Mood & affect appropriate.   Data Reviewed: I have personally reviewed following labs and imaging studies  CBC: Recent Labs  Lab 08/04/23 2319 08/06/23 0419 08/07/23 0302 08/07/23 1927 08/09/23 0322  WBC 5.5 5.4 5.0  --  5.5  HGB 10.4* 7.8* 7.1* 10.3* 10.3*  HCT 31.2* 24.4* 22.7* 30.9* 30.2*  MCV 89.9 92.8 93.4  --  87.5  PLT 123* 76* 76*  --  81*    Basic Metabolic Panel: Recent Labs  Lab 08/06/23 0034 08/06/23 0419 08/07/23 0302 08/08/23 0423 08/09/23 0322  NA 129* 128* 125* 121* 125*  K 4.4 4.2 3.9 3.4* 3.7  CL 99 100 95* 89* 94*  CO2 21* 23 25 26 25   GLUCOSE 125* 116* 107* 100* 105*  BUN 10 10 6 7 10   CREATININE 0.46 0.44 0.49 <0.30* 0.36*  CALCIUM  8.0* 7.8* 7.8* 7.7* 7.8*  MG  --  1.7 1.6* 1.5* 1.7  PHOS  --  2.0* 2.6 3.4 3.8    CBG: Recent Labs  Lab 08/08/23 1124 08/08/23 1629 08/08/23 2350 08/09/23 0800 08/09/23 1131  GLUCAP 130* 113* 81 117* 119*    Recent Results (from the past 240 hours)  Aerobic/Anaerobic Culture w Gram Stain (surgical/deep wound)     Status: None (Preliminary result)   Collection Time: 08/05/23  4:56 AM   Specimen: Path fluid; Body Fluid  Result Value Ref Range Status   Specimen Description   Final    FLUID Performed at [facility], [address], [place], [name] [address]     Special Requests   Final    NONE Performed at [facility], [address] [place], [name] [address]    Gram Stain   Final    RARE WBC SEEN RARE GRAM POSITIVE COCCI RARE GRAM NEGATIVE RODS Performed at [facility], [address], [place], [name] [address]    Culture   Final    FEW MULTIPLE ORGANISMS PRESENT, NONE PREDOMINANT NO GROUP A STREP (S.PYOGENES) ISOLATED NO STAPHYLOCOCCUS AUREUS ISOLATED NO ANAEROBES ISOLATED; CULTURE IN PROGRESS FOR 5 DAYS    Report Status PENDING  Incomplete  MRSA Next Gen by PCR, Nasal     Status: None   Collection Time: 08/05/23  7:53 AM   Specimen: Nasal Mucosa; Nasal Swab  Result Value Ref Range Status   MRSA by PCR Next Gen NOT DETECTED NOT  DETECTED Final    Comment: (NOTE) The GeneXpert MRSA Assay (FDA approved for NASAL specimens only), is one component of a comprehensive MRSA colonization surveillance program. It is not intended to diagnose MRSA infection nor to guide or monitor treatment for MRSA infections. Test performance is not FDA approved in patients less than 40 years old. Performed at [facility], [address] [place], [name] [address]      Radiology Studies: DG Abd 1 View Result Date: 08/07/2023 CLINICAL DATA:  Nasogastric tube EXAM: ABDOMEN - 1 VIEW COMPARISON:  Abdominal x-ray 08/05/2023 FINDINGS: Nasogastric tube tip at the level of the gastric antrum. Surgical drain and skin staples are again seen in the mid abdomen. Right upper extremity PICC terminates over the distal SVC. The lungs appear clear. There is no pneumothorax. The cardiomediastinal silhouette is within normal limits. IMPRESSION: Nasogastric tube tip at the level of the gastric antrum. Electronically Signed   By: Greig Pique M.D.   On: 08/07/2023 22:29    Scheduled Meds:  bisacodyl   10 mg Rectal Daily   chlorhexidine   15 mL Mouth/Throat Once   Chlorhexidine  Gluconate Cloth  6 each Topical Daily   folic acid   1 mg Oral Daily   insulin  aspart  0-9  Units Subcutaneous Q8H   nicotine   14 mg Transdermal Daily   pantoprazole  (PROTONIX ) IV  40 mg Intravenous Q12H   sertraline   25 mg Oral Daily   sodium chloride  flush  10-40 mL Intracatheter Q12H   sucralfate   1 g Oral TID WC & HS   Continuous Infusions:  piperacillin -tazobactam (ZOSYN )  IV 3.375 g (08/09/23 1331)   potassium chloride  10 mEq (08/09/23 1329)   TPN ADULT (ION) 75 mL/hr at 08/09/23 1249   TPN ADULT (ION)       LOS: 4 days   Critical Care Procedure Note Authorized and Performed by: KYM Louder MD  Total Critical Care time:  55 mins Due to a high probability of clinically significant, life threatening deterioration, the patient required my highest level of preparedness to intervene emergently and I personally spent this critical care time directly and personally managing the patient.  This critical care time included obtaining a history; examining the patient, pulse oximetry; ordering and review of studies; arranging urgent treatment with development of a management plan; evaluation of patient's response of treatment; frequent reassessment; and discussions with other providers.  This critical care time was performed to assess and manage the high probability of imminent and life threatening deterioration that could result in multi-organ failure.  It was exclusive of separately billable procedures and treating other patients and teaching time.    Afton Louder, MD How to contact the [facility] Attending or Consulting provider 7A - 7P or covering provider during after hours 7P -7A, for this patient?  Check the care team in [facility] and look for a) attending/consulting [facility] provider listed and b) the [facility] team listed Log into [email or web address] to find provider on call.  Locate the [facility] provider you are looking for under [facility] and page to a number that you can be directly reached. If you still have difficulty reaching the provider, please page the [facility] (Director on Call) for the  Hospitalists listed on amion for assistance.  08/09/2023, 1:41 PM

## 2023-08-09 NOTE — Plan of Care (Signed)
  Problem: Activity: Goal: Risk for activity intolerance will decrease Outcome: Progressing   Problem: Nutrition: Goal: Adequate nutrition will be maintained Outcome: Not Progressing   Problem: Pain Managment: Goal: General experience of comfort will improve and/or be controlled Outcome: Progressing

## 2023-08-09 NOTE — Progress Notes (Signed)
 [facility]  Doing fair. No issues. JP with serous fluid. BM with suppository.  BP 139/88   Pulse 93   Temp 98.1 F (36.7 C) (Oral)   Resp 12   Ht 5' 2 (1.575 m)   Wt 60.1 kg   SpO2 94%   BMI 24.23 kg/m  Soft, nondistended, appropriately tender  JP with serous output Midline c/d/I with staples  Patient s/p gastrorrhaphy and patch, liver biopsy, umbilical hernia repair for perforated gastric ulcer, cirrhosis, umbilical hernia. Doing fair.    UGI tomorrow TPN OOB JP in place  Manuelita Pander, MD [facility] [address] [place] [address], [name] [address] [phone] (office)

## 2023-08-09 NOTE — Progress Notes (Incomplete)
 4 Days Post-Op  Subjective: [name] #POD4 s/p exploratory laparotomy, gastrorrhaphy, omental patch, wedge liver biopsy, and primary repair of umbilical hernia for gastric perforation with free air, abdominal distension, and peritonitis who presents with ongoing pain. Overnight nursing staff reported that the patient appeared less confused than on previous nights. She had a small bowel movement and was able to ambulate approximately 30 ft with assistance. She received two doses of morphine  overnight due to breakthrough pain.   Patient reports that her pain feels worst today, which she attributes in part to feeling depressed. She expresses fustration about about needing assistance with using the bathroom, stating that it contributes to her emotional distress. The pain remains in the same areas as before, but feels slightly more diffuse. She continues to experience pain with coughing, laughing, and sneezing. She also endorses irritability and nausea, but denies any vomiting.   Objective: Vital signs in last 24 hours: Temp:  [97.7 F (36.5 C)-98.6 F (37 C)] 98 F (36.7 C) (07/31 0400) Pulse Rate:  [89-101] 92 (07/31 0600) Resp:  [7-23] 10 (07/31 0600) BP: (106-155)/(64-99) 128/78 (07/31 0600) SpO2:  [93 %-97 %] 94 % (07/31 0600) Weight:  [60.1 kg] 60.1 kg (07/31 0527) Last BM Date : 08/08/23  Intake/Output from previous day: 07/30 0701 - 07/31 0700 In: 2633.7 [I.V.:1829.3; IV Piggyback:804.4] Out: 5025 [Urine:3850; Emesis/NG output:700; Drains:475] Intake/Output this shift: No intake/output data recorded.  Physical Exam Constitutional:      General: She is not in acute distress.    Appearance: She is not ill-appearing.  HENT:     Head: Normocephalic and atraumatic.  Cardiovascular:     Rate and Rhythm: Normal rate and regular rhythm.     Heart sounds: Normal heart sounds. No murmur heard.    No friction rub. No gallop.  Pulmonary:     Effort: Pulmonary  effort is normal. No respiratory distress.     Breath sounds: Normal breath sounds. No rales.  Abdominal:     General: Abdomen is flat. Bowel sounds are normal. There is no distension.     Palpations: Abdomen is soft.     Tenderness: There is generalized abdominal tenderness.     Hernia: No hernia is present.  Skin:    General: Skin is warm and dry.     Coloration: Skin is not jaundiced.  Neurological:     Mental Status: She is alert.  Psychiatric:        Behavior: Behavior normal.    Lab Results:  Recent Labs    08/07/23 0302 08/07/23 1927 08/09/23 0322  WBC 5.0  --  5.5  HGB 7.1* 10.3* 10.3*  HCT 22.7* 30.9* 30.2*  PLT 76*  --  81*   BMET Recent Labs    08/08/23 0423 08/09/23 0322  NA 121* 125*  K 3.4* 3.7  CL 89* 94*  CO2 26 25  GLUCOSE 100* 105*  BUN 7 10  CREATININE <0.30* 0.36*  CALCIUM  7.7* 7.8*   PT/INR Recent Labs    08/06/23 1201 08/07/23 0302  LABPROT 19.2* 18.3*  INR 1.5* 1.4*    Studies/Results: DG Abd 1 View Result Date: 08/07/2023 CLINICAL DATA:  Nasogastric tube EXAM: ABDOMEN - 1 VIEW COMPARISON:  Abdominal x-ray 08/05/2023 FINDINGS: Nasogastric tube tip at the level of the gastric antrum. Surgical drain and skin staples are again seen in the mid abdomen. Right upper extremity PICC terminates over the distal SVC. The lungs appear clear. There is no  pneumothorax. The cardiomediastinal silhouette is within normal limits. IMPRESSION: Nasogastric tube tip at the level of the gastric antrum. Electronically Signed   By: Greig Pique M.D.   On: 08/07/2023 22:29    Anti-infectives: Anti-infectives (From admission, onward)    Start     Dose/Rate Route Frequency Ordered Stop   08/05/23 1000  piperacillin -tazobactam (ZOSYN ) IVPB 3.375 g        3.375 g 12.5 mL/hr over 240 Minutes Intravenous Every 8 hours 08/05/23 0531 08/10/23 1359   08/05/23 0630  fluconazole  (DIFLUCAN ) IVPB 400 mg        400 mg 100 mL/hr over 120 Minutes Intravenous Every 24  hours 08/05/23 0533 08/10/23 1059   08/05/23 0600  cefoTEtan  (CEFOTAN ) 2 g in sodium chloride  0.9 % 100 mL IVPB        2 g 200 mL/hr over 30 Minutes Intravenous On call to O.R. 08/05/23 0138 08/05/23 0427   08/05/23 0253  sodium chloride  0.9 % with cefoTEtan  (CEFOTAN ) ADS Med       Note to Pharmacy: Joshua Planas S: cabinet override      08/05/23 0253 08/05/23 0359   08/05/23 0115  piperacillin -tazobactam (ZOSYN ) IVPB 3.375 g        3.375 g 100 mL/hr over 30 Minutes Intravenous  Once 08/05/23 0112 08/05/23 0155       Assessment/Plan: s/p Procedure(s): GASTRORRHAPHY LAPAROTOMY, EXPLORATORY BIOPSY, LIVER REPAIR, HERNIA, UMBILICAL, ADULT  Assessment: [name] #POD3 s/p exploratory laparotomy, gastrorrhaphy, omental patch, wedge liver biopsy, and primary repair of umbilical hernia for gastric perforation, with improving mental status  Plan: - Continue NPO, NG tube, and ice chips for comfort and water sips w/ medications. - Avoid the use of NSAIDs.  - Continue to promote ambulation. - Continue PPI BID. - Continue Zosyn  and Diflucan  until 7/31 (per the STOP it trial). - Continue to trend labs to monitor hemodynamic stability. - Appreciate GI Involvement; will continue to defer management of potential ascites, hyponatremia, and other electrolyte abnormalities to GI, given underlying alcoholic related cirrhosis.   - JP drain output still remains elevated, so maintain the drain until output decreases to < 50 mL; patient may require paracentesis - Continue to monitor JP drain output closely and collaborate with GI regarding evaluation and management. 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 - Continue TPN via PICC line for now. - Continue to hold prophylaxis for now given cirrhosis and recent surgery.  - Continue the use of SCDs to promote  circulation and prevent blood loss.   LOS: 4 days    Dwane GORMAN Glatter 08/09/2023

## 2023-08-09 NOTE — Plan of Care (Signed)
  Problem: Education: Goal: Knowledge of General Education information will improve Description: Including pain rating scale, medication(s)/side effects and non-pharmacologic comfort measures Outcome: Progressing   Problem: Health Behavior/Discharge Planning: Goal: Ability to manage health-related needs will improve Outcome: Progressing   Problem: Clinical Measurements: Goal: Ability to maintain clinical measurements within normal limits will improve Outcome: Progressing Goal: Will remain free from infection Outcome: Progressing Goal: Diagnostic test results will improve Outcome: Progressing Goal: Respiratory complications will improve Outcome: Progressing Goal: Cardiovascular complication will be avoided Outcome: Progressing   Problem: Activity: Goal: Risk for activity intolerance will decrease Outcome: Progressing   Problem: Nutrition: Goal: Adequate nutrition will be maintained Outcome: Progressing   Problem: Coping: Goal: Level of anxiety will decrease Outcome: Progressing   Problem: Elimination: Goal: Will not experience complications related to bowel motility Outcome: Not Progressing Goal: Will not experience complications related to urinary retention Outcome: Progressing   Problem: Pain Managment: Goal: General experience of comfort will improve and/or be controlled Outcome: Not Progressing   Problem: Safety: Goal: Ability to remain free from injury will improve Outcome: Progressing   Problem: Skin Integrity: Goal: Risk for impaired skin integrity will decrease Outcome: Progressing

## 2023-08-09 NOTE — Progress Notes (Addendum)
 Subjective: Abdominal pain somewhat improved today. Nausea minimal. No BMs thus far. She continues to have abdominal soreness but resting comfortably upon my initiation of our encounter.   Objective: Vital signs in last 24 hours: Temp:  [97.7 F (36.5 C)-98.6 F (37 C)] 98.6 F (37 C) (07/31 0801) Pulse Rate:  [89-101] 93 (07/31 0801) Resp:  [7-23] 12 (07/31 0801) BP: (106-147)/(64-96) 139/88 (07/31 0801) SpO2:  [93 %-97 %] 94 % (07/31 0801) Weight:  [60.1 kg] 60.1 kg (07/31 0527) Last BM Date : 08/08/23 General:   Alert and oriented, pleasant Head:  Normocephalic and atraumatic. Eyes:  No icterus, sclera clear. Conjuctiva pink.  Mouth:  Without lesions, mucosa pink and moist.  Heart:  S1, S2 present, no murmurs noted.  Lungs: Clear to auscultation bilaterally, without wheezing, rales, or rhonchi.  Abdomen:  Bowel sounds present, hypoactive soft, non-distended. Surgical dressing to mid abdomen/umbilucus area. General TTP. No HSM or hernias noted. No rebound or guarding. No masses appreciated  Extremities:  Without clubbing or edema. Neurologic:  Alert and  oriented x4;  grossly normal neurologically. Skin:  Warm and dry, intact without significant lesions.  Psych:  Alert and cooperative. Normal mood and affect.  Intake/Output from previous day: 07/30 0701 - 07/31 0700 In: 2633.7 [I.V.:1829.3; IV Piggyback:804.4] Out: 5025 [Urine:3850; Emesis/NG output:700; Drains:475] Intake/Output this shift: Total I/O In: 10 [I.V.:10] Out: 165 [Urine:100; Drains:65]  Lab Results: Recent Labs    08/07/23 0302 08/07/23 1927 08/09/23 0322  WBC 5.0  --  5.5  HGB 7.1* 10.3* 10.3*  HCT 22.7* 30.9* 30.2*  PLT 76*  --  81*   BMET Recent Labs    08/07/23 0302 08/08/23 0423 08/09/23 0322  NA 125* 121* 125*  K 3.9 3.4* 3.7  CL 95* 89* 94*  CO2 25 26 25   GLUCOSE 107* 100* 105*  BUN 6 7 10   CREATININE 0.49 <0.30* 0.36*  CALCIUM  7.8* 7.7* 7.8*   LFT Recent Labs    08/07/23 0302  08/08/23 0423 08/09/23 0322  PROT 5.5* 5.8* 5.3*  ALBUMIN 1.9* 2.1* 1.8*  AST 54* 43* 32  ALT 20 17 15   ALKPHOS 39 46 45  BILITOT 0.8 1.4* 1.3*   PT/INR Recent Labs    08/06/23 1201 08/07/23 0302  LABPROT 19.2* 18.3*  INR 1.5* 1.4*    Studies/Results: DG Abd 1 View Result Date: 08/07/2023 CLINICAL DATA:  Nasogastric tube EXAM: ABDOMEN - 1 VIEW COMPARISON:  Abdominal x-ray 08/05/2023 FINDINGS: Nasogastric tube tip at the level of the gastric antrum. Surgical drain and skin staples are again seen in the mid abdomen. Right upper extremity PICC terminates over the distal SVC. The lungs appear clear. There is no pneumothorax. The cardiomediastinal silhouette is within normal limits. IMPRESSION: Nasogastric tube tip at the level of the gastric antrum. Electronically Signed   By: Greig Pique M.D.   On: 08/07/2023 22:29    Assessment: Joan Wood is a 53 year old female with history of ETOH abuse, cirrhosis felt likely due to ETOH, GERD, HTN, SVT, periumbilical hernia, chronic diarrhea, GI bleed in May 2025 with findings of PUD on EGD, lost to follow-up for surveillance EGD, presenting to the ED 7/26 with abdominal pain, vomiting, and diarrhea, found to have gastric perforation for which she underwent exploratory laparotomy with gastrorrhaphy, omental patch, wedge liver biopsy, primary umbilical hernia repair, JP drain placement. 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 - MELD 3.0 pending INR**    -Concerns for ascites due to increased drainage on 7/27 and 7/28 - No significant ascites noted on  ultrasound on 7/28 - Prior CT on 7/27 with free fluid, this was in setting of perforated gastric ulcer - JP drainage has decreased some, 65cc output so far this morning, 475cc total yesterday, Gen surgery goal of <50cc/24 hour period - Mildly distended but soft abdomen, No signs of encephalopathy or lower extremity edema -no diuretics at this time due to hyponatremia, hypokalemia (now corrected), though may consider low dose lasix/spironolactone with correction of electrolytes -consider paracentesis given ongoing JP drain output    Acute on chronic anemia: - Baseline hemoglobin 8-9 range, likely due to known peptic ulcer disease - No previous colonoscopy, therefore cannot rule out colonic etiology - Hemoglobin admission was 10.4, down to 7.1 yesterday, stable at 10.3 for the past 2 days, status post 2 units packed red blood cells - Likely multifactorial in setting of recent surgery and perforated ulcer - Will need outpatient colonoscopy once recovered from acute illness   Possible liver lesion: - Noted on CT in 2022, also earlier this year on imaging which was indeterminate - AFP in April 2025 was normal - Will need outpatient MRI for further evaluation     Plan: - Trend H&H -avoid all NSAIDs to include meloxicam   -Monitor for overt bleeding -PPI twice daily -CBC, CMP, INR daily  -Alcohol cessation -OutPatient colonoscopy -Outpatient MRI for liver lesion -outpatient cirrhosis care  -consider addition of low dose diuretics with correction of sodium -may need paracentesis given ongoing JP drainage output and ongoing hyponatremia inhibiting diuretic use    LOS: 4 days    08/09/2023, 9:01 AM  Kyann Heydt L. Anaelle Dunton, MSN, APRN, AGNP-C Adult-Gerontology Nurse Practitioner [facility] at [facility]

## 2023-08-09 NOTE — Progress Notes (Signed)
 PHARMACY - TOTAL PARENTERAL NUTRITION CONSULT NOTE   Indication: bowel perforation  Patient Measurements: Height: 5' 2 (157.5 cm) Weight: 60.1 kg (132 lb 7.9 oz) IBW/kg (Calculated) : 50.1 TPN AdjBW (KG): 54.4 Body mass index is 24.23 kg/m.  Assessment:  53 yo female admitted with perforated gastric ulcer in the setting of cirrhosis and umbilical hernia. JP drain in place and output decreasing. Urine output improved. Pain improving  Glucose / Insulin : 81-130 , no insulin  Electrolytes: Na 126>128> 125> 121> 125 K 3.9> 3.4> 3.7 Phos 2.0> 2.6> 3.4 Mag 1.6> 1.5> 1.7 Renal: WNL Hepatic: Tbili 1.4   Albumin 2.2 Intake / Output;  I/O 2633> 5,225 ( -2591)  JP drain : 521mls> D/C MIVF per MD with TPN at 10mls/hr  GI Imaging/ Procedures:  7/27:  ex lap, gastrorrhaphy, omental patch, liver biopsy, and umbilical hernia repair.  Central access: PICC placement 7/28 TPN start date: 7/28  Nutritional Goals: TPN will provide Protein 81g, CHO, 979KCal, Fat, 360 Kcal for total of 1663kcal/day Patient will meet greater than or equal to 90% of their needs   RD Assessment:  Ideal Body Weight:  50 kg  BMI:  Body mass index is 21.95 kg/m.  Estimated Nutritional Needs:   Kcal:  1600-1800  Protein:  75-90 gm  Fluid:  1.6-1.8 L   Current Nutrition:  NPO  Plan:  Magnesium  Sulfate 4gm IV now KCL IV 10meq IV x 3 runs Continue  TPN at 75 mL/hr at 1800 Electrolytes in TPN: Na 53mEq/L, K 50mEq/L, Ca 31mEq/L, Mg 15mEq/L, and Phos 15mmol/L. Cl:Ac 1:1 Add standard MVI and trace elements to TPN Initiate Sensitive q8h SSI and adjust as needed  Monitor TPN labs on Mon/Thurs  Cherlyn Boers, BS Pharm D, BCPS Clinical Pharmacist 08/09/2023 8:03 AM

## 2023-08-09 NOTE — Progress Notes (Signed)
 Nutrition Follow-up  DOCUMENTATION CODES:   Non-severe (moderate) malnutrition in context of acute illness/injury  INTERVENTION:   TPN to meet 100% of estimated nutrition needs as able.  Continue to monitor magnesium , potassium, and phosphorus for at least 3 days, MD to replete as needed, as pt is at risk for refeeding syndrome given malnutrition, minimal intake for the past week, and hx ETOH abuse.  Continue Thiamine  100 mg daily for 7 days. For vitamin D  deficiency, recommend vitamin D  supplementation 6,000 international units daily or 50,000 international units weekly x 8 weeks, then 1,000 international units daily for several months for maintenance. Can start when patient is tolerating enteral/PO diet.   NUTRITION DIAGNOSIS:   Moderate Malnutrition related to acute illness as evidenced by energy intake < 75% for > 7 days, mild muscle depletion, percent weight loss (1.6% weight loss x 1 week); ongoing   GOAL:   Patient will meet greater than or equal to 90% of their needs; met with TPN   MONITOR:   I & O's, Diet advancement  REASON FOR ASSESSMENT:   Consult New TPN/TNA  ASSESSMENT:   53 yo female admitted with perforated gastric ulcer in the setting of cirrhosis and umbilical hernia. PMH includes SVT, GERD, HTN, abdominal wall hernia, anxiety, chronic alcohol use, liver cirrhosis.  7/27: S/P ex lap, gastrorrhaphy, omental patch, liver biopsy, and umbilical hernia repair.  TPN was initiated 7/28 after PICC placement. Current TPN is infusing at 75 ml/h to provide 1663 kcal and 81 gm protein daily. This meets 100% of estimated calorie and protein needs.   Remains NPO except ice chips and sips with medications.   NG tube to LIS: 700 ml output x 24 hours JP drain in R abdomen: 475 ml output x 24 hours  Labs reviewed. Na 125  Vitamin D , 25-Hydroxy 19.3 (07/30/23) = vitamin D  deficiency (will be able to begin supplementation when patient is tolerating PO/EN) CBG:  [id number]  Medications reviewed and include folic acid , novolog , protonix , sucralfate , mag sulfate, potassium chloride .  Admit weight: 54.4 kg (7/26) Current weight: 60.1 kg (7/31) Increase in weight likely r/t swelling/fluids after recent surgery.  Diet Order:   Diet Order             Diet NPO time specified Except for: Ice Chips, Sips with Meds  Diet effective now                   EDUCATION NEEDS:   No education needs have been identified at this time  Skin:  Skin Assessment: Skin Integrity Issues: Skin Integrity Issues:: Incisions Incisions: surgical incision to abdomen  Last BM:  7/30 type 6  Height:   Ht Readings from Last 1 Encounters:  08/04/23 5' 2 (1.575 m)    Weight:   Wt Readings from Last 1 Encounters:  08/09/23 60.1 kg    Ideal Body Weight:  50 kg  BMI:  Body mass index is 24.23 kg/m.  Estimated Nutritional Needs:   Kcal:  1600-1800  Protein:  75-90 gm  Fluid:  1.6-1.8 L   Suzen HUNT RD, LDN, CNSC Contact via secure chat. If unavailable, use group chat RD Inpatient.

## 2023-08-10 ENCOUNTER — Inpatient Hospital Stay (HOSPITAL_COMMUNITY)

## 2023-08-10 DIAGNOSIS — K7581 Nonalcoholic steatohepatitis (NASH): Secondary | ICD-10-CM

## 2023-08-10 DIAGNOSIS — K255 Chronic or unspecified gastric ulcer with perforation: Secondary | ICD-10-CM | POA: Diagnosis not present

## 2023-08-10 DIAGNOSIS — K2960 Other gastritis without bleeding: Secondary | ICD-10-CM | POA: Diagnosis not present

## 2023-08-10 DIAGNOSIS — K7030 Alcoholic cirrhosis of liver without ascites: Secondary | ICD-10-CM | POA: Diagnosis not present

## 2023-08-10 DIAGNOSIS — E871 Hypo-osmolality and hyponatremia: Secondary | ICD-10-CM

## 2023-08-10 DIAGNOSIS — Z4682 Encounter for fitting and adjustment of non-vascular catheter: Secondary | ICD-10-CM | POA: Diagnosis not present

## 2023-08-10 DIAGNOSIS — K7460 Unspecified cirrhosis of liver: Secondary | ICD-10-CM | POA: Diagnosis not present

## 2023-08-10 DIAGNOSIS — T39395A Adverse effect of other nonsteroidal anti-inflammatory drugs [NSAID], initial encounter: Secondary | ICD-10-CM

## 2023-08-10 DIAGNOSIS — E876 Hypokalemia: Secondary | ICD-10-CM | POA: Diagnosis not present

## 2023-08-10 DIAGNOSIS — R198 Other specified symptoms and signs involving the digestive system and abdomen: Secondary | ICD-10-CM | POA: Diagnosis not present

## 2023-08-10 DIAGNOSIS — D649 Anemia, unspecified: Secondary | ICD-10-CM | POA: Diagnosis not present

## 2023-08-10 LAB — GLUCOSE, CAPILLARY
Glucose-Capillary: 104 mg/dL — ABNORMAL HIGH (ref 70–99)
Glucose-Capillary: 114 mg/dL — ABNORMAL HIGH (ref 70–99)
Glucose-Capillary: 114 mg/dL — ABNORMAL HIGH (ref 70–99)
Glucose-Capillary: 96 mg/dL (ref 70–99)

## 2023-08-10 LAB — AEROBIC/ANAEROBIC CULTURE W GRAM STAIN (SURGICAL/DEEP WOUND)

## 2023-08-10 LAB — PROTEIN, BODY FLUID (OTHER): Total Protein, Body Fluid Other: 2.4 g/dL

## 2023-08-10 LAB — MAGNESIUM: Magnesium: 1.7 mg/dL (ref 1.7–2.4)

## 2023-08-10 LAB — CBC
HCT: 30.6 % — ABNORMAL LOW (ref 36.0–46.0)
Hemoglobin: 10.1 g/dL — ABNORMAL LOW (ref 12.0–15.0)
MCH: 29.2 pg (ref 26.0–34.0)
MCHC: 33 g/dL (ref 30.0–36.0)
MCV: 88.4 fL (ref 80.0–100.0)
Platelets: 88 K/uL — ABNORMAL LOW (ref 150–400)
RBC: 3.46 MIL/uL — ABNORMAL LOW (ref 3.87–5.11)
RDW: 17.2 % — ABNORMAL HIGH (ref 11.5–15.5)
WBC: 5.6 K/uL (ref 4.0–10.5)
nRBC: 0 % (ref 0.0–0.2)

## 2023-08-10 LAB — LD, BODY FLUID (OTHER): LD, Body Fluid: 279 IU/L

## 2023-08-10 LAB — COMPREHENSIVE METABOLIC PANEL WITH GFR
ALT: 14 U/L (ref 0–44)
AST: 33 U/L (ref 15–41)
Albumin: 1.9 g/dL — ABNORMAL LOW (ref 3.5–5.0)
Alkaline Phosphatase: 53 U/L (ref 38–126)
Anion gap: 6 (ref 5–15)
BUN: 11 mg/dL (ref 6–20)
CO2: 26 mmol/L (ref 22–32)
Calcium: 7.9 mg/dL — ABNORMAL LOW (ref 8.9–10.3)
Chloride: 93 mmol/L — ABNORMAL LOW (ref 98–111)
Creatinine, Ser: <0.3 mg/dL — ABNORMAL LOW (ref 0.44–1.00)
Glucose, Bld: 99 mg/dL (ref 70–99)
Potassium: 3.7 mmol/L (ref 3.5–5.1)
Sodium: 125 mmol/L — ABNORMAL LOW (ref 135–145)
Total Bilirubin: 1.2 mg/dL (ref 0.0–1.2)
Total Protein: 5.7 g/dL — ABNORMAL LOW (ref 6.5–8.1)

## 2023-08-10 LAB — PHOSPHORUS: Phosphorus: 3.9 mg/dL (ref 2.5–4.6)

## 2023-08-10 MED ORDER — TRAVASOL 10 % IV SOLN
INTRAVENOUS | Status: AC
  Filled 2023-08-10: qty 810

## 2023-08-10 MED ORDER — MAGNESIUM SULFATE 4 GM/100ML IV SOLN
4.0000 g | Freq: Once | INTRAVENOUS | Status: AC
  Administered 2023-08-10: 4 g via INTRAVENOUS
  Filled 2023-08-10: qty 100

## 2023-08-10 MED ORDER — BOOST / RESOURCE BREEZE PO LIQD CUSTOM
1.0000 | Freq: Three times a day (TID) | ORAL | Status: DC
  Administered 2023-08-10 – 2023-08-11 (×3): 237 mL via ORAL
  Administered 2023-08-11 – 2023-08-13 (×4): 1 via ORAL

## 2023-08-10 MED ORDER — POTASSIUM CHLORIDE 10 MEQ/100ML IV SOLN
10.0000 meq | INTRAVENOUS | Status: AC
  Administered 2023-08-10 (×2): 10 meq via INTRAVENOUS
  Filled 2023-08-10 (×2): qty 100

## 2023-08-10 NOTE — Discharge Instructions (Addendum)
 Discharge Open Abdominal Surgery Instructions:  Drain Care:  Please keep the drain clean and dry. Replace the gauze/ tape around the drain if it gets dirty or wet/ saturated. Please do not mess with or cut the stitch that is keeping the drain in place. Secure the drain to your clothes so that it does not get dislodged.  You may want to wear a binder around your abdomen (girdle) at night for sleeping if you are worried about it getting pulled out.  Please record the output from the drain daily including the color and the amount in milliliters.  Please keep the drain covered with plastic and tape when you shower so that it does not get wet.  Call if your output suddenly increases to larger amounts. Right now it has been between 200-400. We would ideally like it to be < 100 to remove the drain.   Common Complaints: Pain at the incision site is common. This will improve with time. Take your pain medications as described below. Some nausea is common and poor appetite. The main goal is to stay hydrated the first few days after surgery.   Diet/ Activity: Diet as tolerated. You have started and tolerated a diet in the hospital, and should continue to increase what you are able to eat.   You may not have a large appetite, but it is important to stay hydrated. Drink 64 ounces of water a day. Your appetite will return with time.  Keep a dry dressing in place over your staples daily or as needed. Some minor pink/ blood tinged drainage is expected. This will stop in a few days after surgery.  Shower per your regular routine daily.  Do not take hot showers. Take warm showers that are less than 10 minutes. Path the incision dry. Wear an abdominal binder daily with activity. You do not have to wear this while sleeping or sitting.  Rest and listen to your body, but do not remain in bed all day.  Walk everyday for at least 15-20 minutes. Deep cough and move around every 1-2 hours in the first few days after  surgery.  Do not lift > 10 lbs, perform excessive bending, pushing, pulling, squatting for 6-8 weeks after surgery.  The activity restrictions and the abdominal binder are to prevent hernia formation at your incision while you are healing.  Do not place lotions or balms on your incision unless instructed to specifically by Dr. Kallie.  NO NSAIDS, aleve, ibuprofen, BC powder.  You can take no more than tylenol  2000 mg (2grams) a day per the gastroenterologist due to your liver.  Take Protonix   twice daily.   Pain Expectations and Narcotics: -After surgery you will have pain associated with your incisions and this is normal. The pain is muscular and nerve pain, and will get better with time. -You are encouraged and expected to take non narcotic medications like tylenol  and ibuprofen (when able) to treat pain as multiple modalities can aid with pain treatment. -Narcotics are only used when pain is severe or there is breakthrough pain. -You are not expected to have a pain score of 0 after surgery, as we cannot prevent pain. A pain score of 3-4 that allows you to be functional, move, walk, and tolerate some activity is the goal. The pain will continue to improve over the days after surgery and is dependent on your surgery. -Due to [place] law, we are only able to give a certain amount of pain medication to treat post  operative pain, and we only give additional narcotics on a patient by patient basis.  -For most laparoscopic surgery, studies have shown that the majority of patients only need 10-15 narcotic pills, and for open surgeries most patients only need 15-20.   -Having appropriate expectations of pain and knowledge of pain management with non narcotics is important as we do not want anyone to become addicted to narcotic pain medication.  -Using ice packs in the first 48 hours and heating pads after 48 hours, wearing an abdominal binder (when recommended), and using over the counter medications are all  ways to help with pain management.   -Simple acts like meditation and mindfulness practices after surgery can also help with pain control and research has proven the benefit of these practices.   Contact Information: If you have questions or concerns, please call our office, [phone], Monday- Thursday 8AM-5PM and Friday 8AM-12Noon.  If it is after hours or on the weekend, please call [facility] Main Number, [phone], [phone] and ask to speak to the surgeon on call for Dr. Kallie at [facility].      IMPORTANT INFORMATION: PAY CLOSE ATTENTION   PHYSICIAN DISCHARGE INSTRUCTIONS  Follow with Primary care provider  Zarwolo, Gloria, FNP  and other consultants as instructed by your Hospitalist Physician  SEEK MEDICAL CARE OR RETURN TO EMERGENCY ROOM IF SYMPTOMS COME BACK, WORSEN OR NEW PROBLEM DEVELOPS   Please note: You were cared for by a hospitalist during your hospital stay. Every effort will be made to forward records to your primary care provider.  You can request that your primary care provider send for your hospital records if they have not received them.  Once you are discharged, your primary care physician will handle any further medical issues. Please note that NO REFILLS for any discharge medications will be authorized once you are discharged, as it is imperative that you return to your primary care physician (or establish a relationship with a primary care physician if you do not have one) for your post hospital discharge needs so that they can reassess your need for medications and monitor your lab values.  Please get a complete blood count and chemistry panel checked by your Primary MD at your next visit, and again as instructed by your Primary MD.  Get Medicines reviewed and adjusted: Please take all your medications with you for your next visit with your Primary MD  Laboratory/radiological data: Please request your Primary MD to go over all hospital tests and  procedure/radiological results at the follow up, please ask your primary care provider to get all Hospital records sent to his/her office.  In some cases, they will be blood work, cultures and biopsy results pending at the time of your discharge. Please request that your primary care provider follow up on these results.  If you are diabetic, please bring your blood sugar readings with you to your follow up appointment with primary care.    Please call and make your follow up appointments as soon as possible.    Also Note the following: If you experience worsening of your admission symptoms, develop shortness of breath, life threatening emergency, suicidal or homicidal thoughts you must seek medical attention immediately by calling 911 or calling your MD immediately  if symptoms less severe.  You must read complete instructions/literature along with all the possible adverse reactions/side effects for all the Medicines you take and that have been prescribed to you. Take any new Medicines after you have completely understood and  accpet all the possible adverse reactions/side effects.   Do not drive when taking Pain medications or sleeping medications (Benzodiazepines)  Do not take more than prescribed Pain, Sleep and Anxiety Medications. It is not advisable to combine anxiety,sleep and pain medications without talking with your primary care practitioner  Special Instructions: If you have smoked or chewed Tobacco  in the last 2 yrs please stop smoking, stop any regular Alcohol  and or any Recreational drug use.  Wear Seat belts while driving.  Do not drive if taking any narcotic, mind altering or controlled substances or recreational drugs or alcohol.

## 2023-08-10 NOTE — Progress Notes (Signed)
 Gastroenterology Progress Note   Referring Provider: No ref. provider found Primary Care Physician:  Zarwolo, Gloria, FNP Primary Gastroenterologist:  Carlin POUR. Cindie, DO  Patient ID: Joan Wood; [id number]; 21-Oct-1970   Subjective   NGT removed as I presented to her room. Having some nausea with removal and intermittent abdominal spasms. Eager to try some liquids eventually. + flatus. No BM.   Objective   Vital signs in last 24 hours Temp:  [98.1 F (36.7 C)-99 F (37.2 C)] 98.7 F (37.1 C) (08/01 0750) Pulse Rate:  [85-100] 98 (08/01 1000) Resp:  [8-19] 14 (08/01 1000) BP: (97-146)/(55-99) 120/74 (08/01 1000) SpO2:  [93 %-99 %] 96 % (08/01 1000) Last BM Date : 08/09/23  Physical Exam General:   Alert and oriented, pleasant Head:  Normocephalic and atraumatic. Eyes:  No icterus, sclera clear. Conjuctiva pink.    Abdomen:  Bowel sounds present, mild distention. TTP generalized. Midline incision C/D/I. JP wit serous output.  No HSM or hernias noted. No rebound or guarding. No masses appreciated Extremities:  Without clubbing or edema. Neurologic:  Alert and  oriented x4;  grossly normal neurologically. No asterixis.  Skin:  Warm and dry, intact without significant lesions.  Psych:  Alert and cooperative. Normal mood and affect.  Intake/Output from previous day: 07/31 0701 - 08/01 0700 In: 2881.2 [P.O.:120; I.V.:1930.5; NG/GT:60; IV Piggyback:770.7] Out: 2515 [Urine:1350; Emesis/NG output:700; Drains:465] Intake/Output this shift: Total I/O In: 303.2 [I.V.:234.6; NG/GT:30; IV Piggyback:38.6] Out: 160 [Urine:100; Drains:60]  Lab Results  Recent Labs    08/07/23 1927 08/09/23 0322 08/10/23 0349  WBC  --  5.5 5.6  HGB 10.3* 10.3* 10.1*  HCT 30.9* 30.2* 30.6*  PLT  --  81* 88*   BMET Recent Labs    08/08/23 0423 08/09/23 0322 08/10/23 0349  NA 121* 125* 125*  K 3.4* 3.7 3.7  CL 89* 94* 93*  CO2 26 25 26   GLUCOSE 100* 105* 99  BUN 7 10 11    CREATININE <0.30* 0.36* <0.30*  CALCIUM  7.7* 7.8* 7.9*   LFT Recent Labs    08/08/23 0423 08/09/23 0322 08/09/23 1806 08/10/23 0349  PROT 5.8* 5.3* 5.7* 5.7*  ALBUMIN 2.1* 1.8* 2.0* 1.9*  AST 43* 32  --  33  ALT 17 15  --  14  ALKPHOS 46 45  --  53  BILITOT 1.4* 1.3*  --  1.2   PT/INR Recent Labs    08/09/23 1142  LABPROT 17.4*  INR 1.4*   Hepatitis Panel No results for input(s): HEPBSAG, HCVAB, HEPAIGM, HEPBIGM in the last 72 hours.  Studies/Results DG Abd 1 View Result Date: 08/07/2023 CLINICAL DATA:  Nasogastric tube EXAM: ABDOMEN - 1 VIEW COMPARISON:  Abdominal x-ray 08/05/2023 FINDINGS: Nasogastric tube tip at the level of the gastric antrum. Surgical drain and skin staples are again seen in the mid abdomen. Right upper extremity PICC terminates over the distal SVC. The lungs appear clear. There is no pneumothorax. The cardiomediastinal silhouette is within normal limits. IMPRESSION: Nasogastric tube tip at the level of the gastric antrum. Electronically Signed   By: Greig Pique M.D.   On: 08/07/2023 22:29   US  Abdomen Limited Result Date: 08/06/2023 CLINICAL DATA:  Evaluate for ascites. EXAM: LIMITED ABDOMEN ULTRASOUND FOR ASCITES TECHNIQUE: Limited ultrasound survey for ascites was performed in all four abdominal quadrants. COMPARISON:  CT abdomen August 05, 2023 FINDINGS: No free fluid within the limited images provided in 4 quadrants. IMPRESSION: No ascites identified on current exam and may  be obscured by bowel gas (on recent CT from 08/05/2023 there is ascites in perihepatic, perisplenic and pelvic cavity). Electronically Signed   By: Megan  Zare M.D.   On: 08/06/2023 16:53   US  EKG SITE RITE Result Date: 08/06/2023 If Site Rite image not attached, placement could not be confirmed due to current cardiac rhythm.  DG Abd 1 View Result Date: 08/05/2023 CLINICAL DATA:  NG tube placement. EXAM: ABDOMEN - 1 VIEW COMPARISON:  None Available. FINDINGS: NG tube tip  is in the region of the gastric fundus. Side port of the NG tube is well below the GE junction. Mild gaseous distention of the stomach evident. Surgical drain overlies the midline upper abdomen. IMPRESSION: NG tube tip is in the region of the gastric fundus. Electronically Signed   By: Camellia Candle M.D.   On: 08/05/2023 06:43   CT ABDOMEN PELVIS W CONTRAST Result Date: 08/05/2023 CLINICAL DATA:  Acute abdominal pain for 2 days, initial encounter EXAM: CT ABDOMEN AND PELVIS WITH CONTRAST TECHNIQUE: Multidetector CT imaging of the abdomen and pelvis was performed using the standard protocol following bolus administration of intravenous contrast. RADIATION DOSE REDUCTION: This exam was performed according to the departmental dose-optimization program which includes automated exposure control, adjustment of the mA and/or kV according to patient size and/or use of iterative reconstruction technique. CONTRAST:  OMNIPAQUE  IOHEXOL  300 MG/ML  SOLN COMPARISON:  05/27/2023 FINDINGS: Lower chest: Lung bases are free of acute infiltrate or sizable effusion. Hepatobiliary: Diffuse decreased attenuation is noted throughout the liver likely related to underlying cirrhotic change. Mild nodularity is seen. Recanalization of the umbilical vein is noted which subsequently passes through a umbilical hernia and than decompresses via collaterals into the left external iliac vein. Gallbladder is well distended without cholelithiasis. Some mild pericholecystic fluid is noted related to the known perforation. Pancreas: Unremarkable. No pancreatic ductal dilatation or surrounding inflammatory changes. Spleen: Normal in size without focal abnormality. Adrenals/Urinary Tract: Adrenal glands are within normal limits. Kidneys show no renal calculi or obstructive changes. The bladder is well distended. Stomach/Bowel: No obstructive or inflammatory changes of the colon are seen. The appendix again demonstrates appendicoliths distally  although no significant changes to suggest acute appendicitis are noted. Small bowel is within normal limits. There remains thickening of the gastric wall similar to that seen on the prior exam. The outpouching of air suspicious for ulceration on the prior exam now shows free communication through the anterior gastric wall along the posterior aspect of the left lobe of the liver consistent with perforated ulcer. Considerable free air is noted. Free fluid is noted as well related to these changes. Small hiatal hernia is noted. Vascular/Lymphatic: Aortic atherosclerosis. No enlarged abdominal or pelvic lymph nodes. Reproductive: Uterus and bilateral adnexa are unremarkable. Other: Free fluid and free air is noted related to the gastric ulcer perforation. Fat containing umbilical hernia is noted. 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 These changes are stable from the prior exam. Musculoskeletal: Old left rib fractures are noted posteriorly. No acute bony abnormality is noted. Anterolisthesis of L4 on L5 is again noted. IMPRESSION: Changes consistent with perforated anterior gastric ulcer progressed in the interval from the prior exam. Considerable free air and free fluid is noted related to the perforation. 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 Prominent appendix with single appendicolith within. This is stable in appearance from the prior exam without inflammatory change. Critical Value/emergent results were called by  telephone at the time of interpretation on 08/05/2023 at 1:06 am to Dr. DONALD WICKLINE , who verbally acknowledged these results. Electronically Signed   By: Oneil Devonshire M.D.   On: 08/05/2023 01:12   DG Chest Portable 1 View Result Date: 08/04/2023 CLINICAL DATA:  Dyspnea EXAM: PORTABLE CHEST 1 VIEW COMPARISON:  CT 05/12/2023 FINDINGS: The heart size and mediastinal contours are within normal limits. Both lungs are clear.  The visualized skeletal structures are unremarkable. IMPRESSION: No active disease. Electronically Signed   By: Dorethia Molt M.D.   On: 08/04/2023 23:50    Assessment  53 y.o. female with a history of   Cirrhosis: - Most likely secondary to EtOH, continues regular alcohol intake with 3-4 beers per day but states this is significantly less than previously - Serologic evaluation in April with negative viral serologies, elevated IgG but normal ANA, ASMA, and AMA. - Liver biopsy performed at time of surgery with pathology indicating mild to moderate active steatohepatitis (grade 1-2 /3) with cirrhosis - Abdomen has been mildly distended but soft - No signs of peripheral edema or HE - Diuretics have been on hold in the setting of hyponatremia and hypokalemia, once stable could consider low-dose Lasix/Aldactone  MELD 3.0: 23 at 08/10/2023  3:49 AM MELD-Na: 11 at 08/10/2023  3:49 AM Calculated from: Serum Creatinine: 0.3 mg/dL (Using min of 1 mg/dL) at 01/17/7972  6:50 AM Serum Sodium: 125 mmol/L at 08/10/2023  3:49 AM Total Bilirubin: 1.2 mg/dL at 01/17/7972  6:50 AM Serum Albumin: 1.9 g/dL at 01/17/7972  6:50 AM INR(ratio): 1.4 at 08/09/2023 11:42 AM Age at listing (hypothetical): 52 years Sex: Female at 08/10/2023  3:49 AM  - SAAG < 1.1 - not likely ascites coming from JP drain. Protein from fluid analysis still in process however most likely post op fluid. JP output today thus far with 130 out nd total 465 documented yesterday.   Acute on chronic anemia: - Hemoglobin 8-9 range at baseline, likely secondary to known peptic ulcer disease now with perforation. -No previous colonoscopy, plans to perform outpatient have been discussed.  Cannot completely rule out colonic etiology. -Admission hemoglobin was 10.4 and decreased to 7.1, now stable in the low 10 range -Continue with plans for outpatient colonoscopy once recovered from acute illness.  Possible liver lesion: - Noted on CT in 2022 with repeat  CT abdomen pelvis in May noting nodular liver contour as well as rounded hypodensity too small to characterize in the inferior right lobe of the liver which was unchanged in size and no new liver lesions present. - Normal AFP in April 2025 - Plans for outpatient MRI for further evaluation.  Perforated gastric ulcer, umbilical hernia: - s/p repair - currently on TPN - NG tube in place - management per surgery - UGI ordered to be performed today.   Plan / Recommendations  Follow up UGI series Continue to trend H/H Avoid NSAIDs including meloxicam , diclofenac, Celebrex Alcohol cessation PPI twice daily Although no overt ascites noted on imaging, could consider low-dose diuretics to help with JP output Outpatient cirrhosis care and MRI for evaluation of liver lesion Outpatient colonoscopy when over acute illness.  Will need outpatient visit in about 6 weeks or so.     LOS: 5 days    08/10/2023, 10:52 AM   Charmaine Melia, MSN, FNP-BC, AGACNP-BC [facility]

## 2023-08-10 NOTE — Progress Notes (Signed)
 5 Days Post-Op  Subjective: [name] #POD5 s/p exploratory laparotomy, gastrorrhaphy, omental patch, wedge liver biopsy, and primary repair of umbilical hernia for gastric perforation with free air, abdominal distension, and peritonitis. Overnight nursing staff report that she did good overnight, they administered pain medicine to the patient twice, and there is still increased serous fluid drainage from the JP drain. The patient reports that her pain is overall improving and getting better. She is also reports that she has gained more independence using the bathroom, and not needing as much help. She reports that she has some pain with sitting up and laying back. She also reports that she has been sleeping a lot more. She additionally,she reports it is easier to take deep breaths. She has not had nay bowel movements since yesterday AM.   Objective: Vital signs in last 24 hours: Temp:  [98.1 F (36.7 C)-99 F (37.2 C)] 98.7 F (37.1 C) (08/01 0750) Pulse Rate:  [85-100] 98 (08/01 1000) Resp:  [8-19] 14 (08/01 1000) BP: (97-146)/(55-99) 130/89 (08/01 0800) SpO2:  [93 %-99 %] 96 % (08/01 1000) Last BM Date : 08/09/23  Intake/Output from previous day: 07/31 0701 - 08/01 0700 In: 2793.9 [P.O.:120; I.V.:1855.6; NG/GT:60; IV Piggyback:758.4] Out: 2515 [Urine:1350; Emesis/NG output:700; Drains:465] Intake/Output this shift: Total I/O In: 10 [I.V.:10] Out: 100 [Urine:100]  Physical Exam Constitutional:      General: She is not in acute distress.    Appearance: She is well-developed. She is not ill-appearing or toxic-appearing.  Cardiovascular:     Rate and Rhythm: Normal rate and regular rhythm.     Heart sounds: Normal heart sounds.  Pulmonary:     Effort: Pulmonary effort is normal. No respiratory distress.     Breath sounds: Normal breath sounds.  Abdominal:     General: Abdomen is flat. A surgical scar is present. Bowel sounds are normal.     Palpations:  Abdomen is soft.     Tenderness: There is generalized abdominal tenderness.     Comments: Midlines incision dressing clean/dry. Wound clean, dry, without erythema, drainage, or signs of infection.   Skin:    General: Skin is warm and dry.     Coloration: Skin is not jaundiced.  Neurological:     Mental Status: She is alert.  Psychiatric:        Mood and Affect: Mood normal.        Behavior: Behavior normal.    Lab Results:  Recent Labs    08/09/23 0322 08/10/23 0349  WBC 5.5 5.6  HGB 10.3* 10.1*  HCT 30.2* 30.6*  PLT 81* 88*   BMET Recent Labs    08/09/23 0322 08/10/23 0349  NA 125* 125*  K 3.7 3.7  CL 94* 93*  CO2 25 26  GLUCOSE 105* 99  BUN 10 11  CREATININE 0.36* <0.30*  CALCIUM  7.8* 7.9*   PT/INR Recent Labs    08/09/23 1142  LABPROT 17.4*  INR 1.4*    Studies/Results: No results found.  Anti-infectives: Anti-infectives (From admission, onward)    Start     Dose/Rate Route Frequency Ordered Stop   08/05/23 1000  piperacillin -tazobactam (ZOSYN ) IVPB 3.375 g        3.375 g 12.5 mL/hr over 240 Minutes Intravenous Every 8 hours 08/05/23 0531 08/10/23 0957   08/05/23 0630  fluconazole  (DIFLUCAN ) IVPB 400 mg        400 mg 100 mL/hr over 120 Minutes Intravenous Every 24 hours 08/05/23 0533  08/09/23 1441   08/05/23 0600  cefoTEtan  (CEFOTAN ) 2 g in sodium chloride  0.9 % 100 mL IVPB        2 g 200 mL/hr over 30 Minutes Intravenous On call to O.R. 08/05/23 0138 08/05/23 0427   08/05/23 0253  sodium chloride  0.9 % with cefoTEtan  (CEFOTAN ) ADS Med       Note to Pharmacy: Joshua Planas S: cabinet override      08/05/23 0253 08/05/23 0359   08/05/23 0115  piperacillin -tazobactam (ZOSYN ) IVPB 3.375 g        3.375 g 100 mL/hr over 30 Minutes Intravenous  Once 08/05/23 0112 08/05/23 0155       Assessment/Plan: s/p Procedure(s): GASTRORRHAPHY LAPAROTOMY, EXPLORATORY BIOPSY, LIVER REPAIR, HERNIA, UMBILICAL, ADULT  Assessment: [name] is a 53 y/o  female #POD5 s/p exploratory laparotomy, gastrorrhaphy, omental patch, wedge liver biopsy, and primary repair of umbilical hernia for gastric perforation with free air, abdominal distension, and peritonitis presents with improving pain.  Plan:  - Upper GI Series scheduled for today. - Continue TPN for now. - Continue to promote ambulation.  - JP drain remains in place; output still remains elevated, so maintain the drain until output decreases to < 50 mL; patient may require paracentesis; will continue to closely monitor output and collaborate with GI regarding evaluation and management.  - Appreciate GI Involvement; will continue to defer management of stage 4/4 alcoholic cirrhosis complications and potential ascites to GI. - Continue NPO, NG tube, and ice chips for comfort and water sips w/ medications.  - Avoid the use of NSAIDs.  - Continue PPI BID.  - Discontinue Zosyn  and Diflucan . - Continue to trend labs to monitor hemodynamic stability.  - Continue to hold prophylaxis for now given cirrhosis and recent surgery.  - Continue the use of SCDs to promote circulation and prevent blood loss.   LOS: 5 days    Dwane GORMAN Glatter 08/10/2023

## 2023-08-10 NOTE — Plan of Care (Signed)
  Problem: Elimination: Goal: Will not experience complications related to bowel motility Outcome: Progressing Goal: Will not experience complications related to urinary retention Outcome: Progressing   Problem: Elimination: Goal: Will not experience complications related to urinary retention Outcome: Progressing   Problem: Pain Managment: Goal: General experience of comfort will improve and/or be controlled Outcome: Progressing

## 2023-08-10 NOTE — Anesthesia Postprocedure Evaluation (Signed)
 Anesthesia Post Note  Patient: Joan Wood  Procedure(s) Performed: GASTRORRHAPHY LAPAROTOMY, EXPLORATORY BIOPSY, LIVER REPAIR, HERNIA, UMBILICAL, ADULT  Patient location during evaluation: Phase II Anesthesia Type: General Level of consciousness: awake Pain management: pain level controlled Vital Signs Assessment: post-procedure vital signs reviewed and stable Respiratory status: spontaneous breathing and respiratory function stable Cardiovascular status: blood pressure returned to baseline and stable Postop Assessment: no headache and no apparent nausea or vomiting Anesthetic complications: no Comments: Late entry   No notable events documented.   Last Vitals:  Vitals:   08/10/23 1200 08/10/23 1312  BP: 123/76 119/70  Pulse: 93 95  Resp: 12 16  Temp:  37.6 C  SpO2: 97% 100%    Last Pain:  Vitals:   08/10/23 1446  TempSrc:   PainSc: 10-Worst pain ever                 Yvonna JINNY Bosworth

## 2023-08-10 NOTE — Progress Notes (Signed)
 PHARMACY - TOTAL PARENTERAL NUTRITION CONSULT NOTE   Indication: bowel perforation  Patient Measurements: Height: 5' 2 (157.5 cm) Weight: 60.1 kg (132 lb 7.9 oz) IBW/kg (Calculated) : 50.1 TPN AdjBW (KG): 54.4 Body mass index is 24.23 kg/m.  Assessment:  53 yo female admitted with perforated gastric ulcer in the setting of cirrhosis and umbilical hernia. JP drain in place and output decreasing. Urine output down today.   Glucose / Insulin : 81-119, no insulin  given Electrolytes: Na 126>128> 125> 121> 125>125 K 3.9> 3.4> 3.7> 3.7 Phos 2.0> 2.6> 3.4> 3.8> 3.9 Mag 1.6> 1.5> 1.7> 1.7 Renal: WNL Hepatic: Tbili 1.4   Albumin 2.2 Intake / Output;  I/O 2794/2515 ( +279)  JP drain : 510mls> 457mls> D/C MIVF per MD with TPN at 44mls/hr  GI Imaging/ Procedures:  7/27:  ex lap, gastrorrhaphy, omental patch, liver biopsy, and umbilical hernia repair.  Central access: PICC placement 7/28 TPN start date: 7/28  Nutritional Goals: TPN will provide Protein 81g, CHO, 979KCal, Fat, 360 Kcal for total of 1663kcal/day Patient will meet greater than or equal to 90% of their needs   RD Assessment:  Ideal Body Weight:  50 kg  BMI:  Body mass index is 21.95 kg/m.  Estimated Nutritional Needs:   Kcal:  1600-1800  Protein:  75-90 gm  Fluid:  1.6-1.8 L   Current Nutrition:  NPO  Plan:  Magnesium  Sulfate 4gm IV now KCL IV 10meq IV x 2 runs Continue  TPN at 75 mL/hr at 1800 Electrolytes in TPN: Na 25mEq/L, K 55mEq/L, Ca 5mEq/L, Mg 64mEq/L, and Phos 10mmol/L. Cl:Ac 1:1 Add standard MVI and trace elements to TPN Continue Sensitive q8h SSI may stop this weekend if continues if not needed Monitor TPN labs   Dempsey Blush PharmD., BCPS Clinical Pharmacist 08/10/2023 9:08 AM

## 2023-08-10 NOTE — Progress Notes (Signed)
 PROGRESS NOTE   Joan Wood  FMW:[id number] DOB: 01-11-1970 DOA: 08/04/2023 PCP: Zarwolo, Gloria, FNP   Chief Complaint  Patient presents with   Abdominal Pain   Level of care: Telemetry  Brief Admission History:  Joan Wood is a 53 y.o. female with medical history significant of anxiety, GERD, cirrhosis, hypertension who presents emergency department due to abdominal pain.  On arrival she was afebrile and hemodynamically stable, labs showed sodium 122, potassium 3.3, creatinine 0.57, WBC 5.5, hemoglobin 10.4, INR 1.3.  Patient CT abdomen pelvis which showed perforated gastric ulcer.  Surgery was consulted and she was taken to the OR for exploratory laparotomy, omental patch and hernia repair on 08/05/23.  She was admitted to the ICU.  She is on TNA.    Assessment and Plan:  Perforated gastric ulcer - Treated surgically Gastric ulcer perforation with liver cirrhosis, umbilical hernia with incarcerated omental and recanalized umbilical vein  Postop day #5 -S/p exploratory laparotomy: gastrorrhaphy, omental patch, liver biopsy, primary umbilical hernia repair completed  -NG tube removed today per surgery team. UGI with no leaks seen.  -Continue ice chips for comfort - JP drain in place with serous output - diet advanced to liquids per surgery team  -Zosyn , and diflucan  completed on 7/31.   -Protonix  40 mg BID - IV muscle relaxants as needed  Foley catheter, postop-removed Holding chemical anticoagulation at this time  - TPN per surgery and pharm D - DC IVF - TPN to stop when able to tolerate solid foods per surgery   Hypotensive -- RESOLVED  - Postop blood pressure remained low-blood pressure improved - midodrine  discontinued  - Did not require pressors    Decompensated alcoholic cirrhosis -with ascites -trend LFTs closely monitor for peritonitis -No signs of alcohol withdrawal -Patient does not want her family to know about her diagnosis alcoholic liver cirrhosis -GI  consult appreciated    Hyponatremia (Chronic)  -Serum sodium: 126>>  128 >> 125 >>121>125 -Secondary to liver cirrhosis with chronic alcohol abuse likely related to alcohol intake.   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, and malnutrition -Continue TPN -diuretics on hold until her sodium improves better per GI team   Hypomagnesemia -- IV replacement ordered and being repleted by pharm D team  -- recheck in AM with TPN labs   Acute on chronic anemia -iron deficiency - Acute on chronic anemia with iron deficiency, liver cirrhosis, progressive decline postop hemoglobin -symptomatic with hypotension -Hemoglobin dropping from 10.4 >> 7.8 >> 7.1 - With hypotension-  s/p 2U PRBC blood transfusion -   Iron/TIBC/Ferritin/ %Sat Labs (Brief)          Component Value Date/Time    IRON 8 (L) 08/06/2023 0419    IRON 73 04/11/2023 1455    TIBC 310 08/06/2023 0419    TIBC 373 04/11/2023 1455    FERRITIN 266 05/12/2023 0930    FERRITIN 165 (H) 04/11/2023 1455    IRONPCTSAT 3 (L) 08/06/2023 0419    IRONPCTSAT 20 04/11/2023 1455    B12 at 757, folate at 6.5   -08/07/2023 2U PRBC blood transfusion -GI has been consulted for liver cirrhosis and ascites appreciate input regarding anemia  DVT prophylaxis: SCDs Code Status: Full  Family Communication:  Disposition:TBD    Consultants:  Surgery Pharmacy   Procedures:  08/05/23 Exp lap, omental patch, liver biopsy, umbilical hernia repair    Subjective: Pt was having abdominal pain earlier this morning but now relieved after  pain medication given.   Objective: Vitals:   08/10/23 1100 08/10/23 1115 08/10/23 1200 08/10/23 1312  BP: 116/72  123/76 119/70  Pulse: 91  93 95  Resp: 12  12 16   Temp:  97.7 F (36.5 C)  99.7 F (37.6 C)  TempSrc:  Oral  Oral  SpO2: 96%  97% 100%  Weight:      Height:        Intake/Output Summary (Last 24 hours) at 08/10/2023 1316 Last data filed at  08/10/2023 1200 Gross per 24 hour  Intake 2295.82 ml  Output 2655 ml  Net -359.18 ml   Filed Weights   08/07/23 0500 08/08/23 0422 08/09/23 0527  Weight: 58 kg 61.7 kg 60.1 kg   Examination:  General exam: Appears calm and comfortable, NG tube removed. TPN ongoing.    Respiratory system: Clear to auscultation. Respiratory effort normal. Cardiovascular system: normal S1 & S2 heard. No JVD, murmurs, rubs, gallops or clicks. No pedal edema. Gastrointestinal system: JP drain in place, Abdomen is nondistended, soft and tender. No organomegaly or masses felt. Normal bowel sounds heard. Central nervous system: Alert and oriented. No focal neurological deficits. Extremities: Symmetric 5 x 5 power. Skin: No rashes, lesions or ulcers. Psychiatry: Judgement and insight appear normal. Mood & affect appropriate.   Data Reviewed: I have personally reviewed following labs and imaging studies  CBC: Recent Labs  Lab 08/04/23 2319 08/06/23 0419 08/07/23 0302 08/07/23 1927 08/09/23 0322 08/10/23 0349  WBC 5.5 5.4 5.0  --  5.5 5.6  HGB 10.4* 7.8* 7.1* 10.3* 10.3* 10.1*  HCT 31.2* 24.4* 22.7* 30.9* 30.2* 30.6*  MCV 89.9 92.8 93.4  --  87.5 88.4  PLT 123* 76* 76*  --  81* 88*    Basic Metabolic Panel: Recent Labs  Lab 08/06/23 0419 08/07/23 0302 08/08/23 0423 08/09/23 0322 08/10/23 0349  NA 128* 125* 121* 125* 125*  K 4.2 3.9 3.4* 3.7 3.7  CL 100 95* 89* 94* 93*  CO2 23 25 26 25 26   GLUCOSE 116* 107* 100* 105* 99  BUN 10 6 7 10 11   CREATININE 0.44 0.49 <0.30* 0.36* <0.30*  CALCIUM  7.8* 7.8* 7.7* 7.8* 7.9*  MG 1.7 1.6* 1.5* 1.7 1.7  PHOS 2.0* 2.6 3.4 3.8 3.9    CBG: Recent Labs  Lab 08/09/23 0800 08/09/23 1131 08/09/23 1721 08/10/23 0026 08/10/23 0838  GLUCAP 117* 119* 104* 104* 114*    Recent Results (from the past 240 hours)  Aerobic/Anaerobic Culture w Gram Stain (surgical/deep wound)     Status: None   Collection Time: 08/05/23  4:56 AM   Specimen: Path fluid; Body  Fluid  Result Value Ref Range Status   Specimen Description   Final    FLUID Performed at [facility], [address], [place], [name] [address]    Special Requests   Final    NONE Performed at [facility], [address], [place], [name] [address]    Gram Stain   Final    RARE WBC SEEN RARE GRAM POSITIVE COCCI RARE GRAM NEGATIVE RODS    Culture   Final    FEW MULTIPLE ORGANISMS PRESENT, NONE PREDOMINANT NO GROUP A STREP (S.PYOGENES) ISOLATED NO STAPHYLOCOCCUS AUREUS ISOLATED NO ANAEROBES ISOLATED Performed at [facility], [address] [place], [name] [address]    Report Status 08/10/2023 FINAL  Final  MRSA Next Gen by PCR, Nasal     Status: None   Collection Time: 08/05/23  7:53 AM  Specimen: Nasal Mucosa; Nasal Swab  Result Value Ref Range Status   MRSA by PCR Next Gen NOT DETECTED NOT DETECTED Final    Comment: (NOTE) The GeneXpert MRSA Assay (FDA approved for NASAL specimens only), is one component of a comprehensive MRSA colonization surveillance program. It is not intended to diagnose MRSA infection nor to guide or monitor treatment for MRSA infections. Test performance is not FDA approved in patients less than 66 years old. Performed at [facility], [address], [place], [name] [address]      Radiology Studies: DG UGI W SINGLE CM (SOL OR THIN BA) Result Date: 08/10/2023 CLINICAL DATA:  Status post surgical repair of perforated gastric ulcer. Request for limited upper GI to evaluate for leak. EXAM: DG UGI W SINGLE CM TECHNIQUE: Single contrast examination was then performed using water-soluble contrast. Contrast injected down patient's existing nasogastric tube into the stomach. This exam was performed by Franky Rusk PA-C , and was supervised and interpreted by Dr. Ester Sides. FLUOROSCOPY: Radiation Exposure Index (as provided by the fluoroscopic device): 44.50 mGy Kerma COMPARISON:  None Available. FINDINGS: Midline surgical staples consistent with  open abdominal surgery. Nasogastric tube with tip terminating in the body of the stomach. Surgical drain noted overlying the expected surgical site. Contrast fills the stomach without difficulty. Images taken in AP, various obliquities, and right lateral decubitus positions. No definitive evidence of contrast extravasation from the expected surgical site. Normal filling of the pylorus with prompt gastric emptying. Normal caliber duodenum with active peristalsis. IMPRESSION: No definitive contrast extravasation or leak from expected gastric perforation surgical repair site. Electronically Signed   By: Ester Sides M.D.   On: 08/10/2023 11:37    Scheduled Meds:  bisacodyl   10 mg Rectal Daily   chlorhexidine   15 mL Mouth/Throat Once   Chlorhexidine  Gluconate Cloth  6 each Topical Daily   folic acid   1 mg Oral Daily   insulin  aspart  0-9 Units Subcutaneous Q8H   nicotine   14 mg Transdermal Daily   pantoprazole  (PROTONIX ) IV  40 mg Intravenous Q12H   sertraline   25 mg Oral Daily   sodium chloride  flush  10-40 mL Intracatheter Q12H   sucralfate   1 g Oral TID WC & HS   Continuous Infusions:  potassium chloride  10 mEq (08/10/23 1307)   TPN ADULT (ION) 75 mL/hr at 08/10/23 1100   TPN ADULT (ION)       LOS: 5 days   Time spent: 55 mins   Britton Perkinson Vicci, MD How to contact the [facility] Attending or Consulting provider 7A - 7P or covering provider during after hours 7P -7A, for this patient?  Check the care team in [facility] and look for a) attending/consulting TRH provider listed and b) the TRH team listed Log into [email or web address] to find provider on call.  Locate the TRH provider you are looking for under [facility] and page to a number that you can be directly reached. If you still have difficulty reaching the provider, please page the [facility] (Director on Call) for the Hospitalists listed on amion for assistance.  08/10/2023, 1:16 PM

## 2023-08-11 DIAGNOSIS — K2960 Other gastritis without bleeding: Secondary | ICD-10-CM | POA: Diagnosis not present

## 2023-08-11 DIAGNOSIS — T39395A Adverse effect of other nonsteroidal anti-inflammatory drugs [NSAID], initial encounter: Secondary | ICD-10-CM | POA: Diagnosis not present

## 2023-08-11 DIAGNOSIS — K7030 Alcoholic cirrhosis of liver without ascites: Secondary | ICD-10-CM | POA: Diagnosis not present

## 2023-08-11 DIAGNOSIS — R198 Other specified symptoms and signs involving the digestive system and abdomen: Secondary | ICD-10-CM | POA: Diagnosis not present

## 2023-08-11 LAB — COMPREHENSIVE METABOLIC PANEL WITH GFR
ALT: 19 U/L (ref 0–44)
AST: 48 U/L — ABNORMAL HIGH (ref 15–41)
Albumin: 1.8 g/dL — ABNORMAL LOW (ref 3.5–5.0)
Alkaline Phosphatase: 70 U/L (ref 38–126)
Anion gap: 9 (ref 5–15)
BUN: 14 mg/dL (ref 6–20)
CO2: 26 mmol/L (ref 22–32)
Calcium: 8.2 mg/dL — ABNORMAL LOW (ref 8.9–10.3)
Chloride: 92 mmol/L — ABNORMAL LOW (ref 98–111)
Creatinine, Ser: 0.35 mg/dL — ABNORMAL LOW (ref 0.44–1.00)
GFR, Estimated: >60 mL/min (ref >60)
Glucose, Bld: 98 mg/dL (ref 70–99)
Potassium: 4.3 mmol/L (ref 3.5–5.1)
Sodium: 127 mmol/L — ABNORMAL LOW (ref 135–145)
Total Bilirubin: 1 mg/dL (ref 0.0–1.2)
Total Protein: 5.4 g/dL — ABNORMAL LOW (ref 6.5–8.1)

## 2023-08-11 LAB — CBC
HCT: 31.2 % — ABNORMAL LOW (ref 36.0–46.0)
Hemoglobin: 9.9 g/dL — ABNORMAL LOW (ref 12.0–15.0)
MCH: 28.6 pg (ref 26.0–34.0)
MCHC: 31.7 g/dL (ref 30.0–36.0)
MCV: 90.2 fL (ref 80.0–100.0)
Platelets: 99 K/uL — ABNORMAL LOW (ref 150–400)
RBC: 3.46 MIL/uL — ABNORMAL LOW (ref 3.87–5.11)
RDW: 17 % — ABNORMAL HIGH (ref 11.5–15.5)
WBC: 5.8 K/uL (ref 4.0–10.5)
nRBC: 0 % (ref 0.0–0.2)

## 2023-08-11 LAB — GLUCOSE, CAPILLARY
Glucose-Capillary: 101 mg/dL — ABNORMAL HIGH (ref 70–99)
Glucose-Capillary: 104 mg/dL — ABNORMAL HIGH (ref 70–99)
Glucose-Capillary: 113 mg/dL — ABNORMAL HIGH (ref 70–99)
Glucose-Capillary: 115 mg/dL — ABNORMAL HIGH (ref 70–99)

## 2023-08-11 LAB — MAGNESIUM: Magnesium: 1.9 mg/dL (ref 1.7–2.4)

## 2023-08-11 LAB — PHOSPHORUS: Phosphorus: 3.9 mg/dL (ref 2.5–4.6)

## 2023-08-11 MED ORDER — KETOROLAC TROMETHAMINE 15 MG/ML IJ SOLN
15.0000 mg | Freq: Once | INTRAMUSCULAR | Status: AC
  Administered 2023-08-11: 15 mg via INTRAVENOUS
  Filled 2023-08-11: qty 1

## 2023-08-11 MED ORDER — ACETAMINOPHEN 325 MG PO TABS
650.0000 mg | ORAL_TABLET | Freq: Four times a day (QID) | ORAL | Status: DC | PRN
  Administered 2023-08-11 – 2023-08-13 (×2): 650 mg via ORAL
  Filled 2023-08-11 (×2): qty 2

## 2023-08-11 MED ORDER — TRAVASOL 10 % IV SOLN
INTRAVENOUS | Status: DC
  Filled 2023-08-11: qty 810

## 2023-08-11 NOTE — Plan of Care (Signed)

## 2023-08-11 NOTE — Progress Notes (Signed)
   08/11/23 0359  Assess: MEWS Score  Temp 100.1 F (37.8 C) (MD Arrien made aware)  BP 121/76  MAP (mmHg) 90  Pulse Rate 93  Resp 20  SpO2 99 %  O2 Device Room Air  Assess: MEWS Score  MEWS Temp 0  MEWS Systolic 0  MEWS Pulse 0  MEWS RR 0  MEWS LOC 0  MEWS Score 0  MEWS Score Color Green  Assess: SIRS CRITERIA  SIRS Temperature  0  SIRS Respirations  0  SIRS Pulse 1  SIRS WBC 0  SIRS Score Sum  1

## 2023-08-11 NOTE — Progress Notes (Signed)
 PHARMACY - TOTAL PARENTERAL NUTRITION CONSULT NOTE   Indication: bowel perforation  Patient Measurements: Height: 5' 2 (157.5 cm) Weight: 55.6 kg (122 lb 9.2 oz) IBW/kg (Calculated) : 50.1 TPN AdjBW (KG): 54.4 Body mass index is 22.42 kg/m.  Assessment:  53 yo female admitted with perforated gastric ulcer in the setting of cirrhosis and umbilical hernia. JP drain in place and output decreasing.   Glucose / Insulin : 96-115, no insulin  given Electrolytes: Na 126>128> 125> 121> 125>125>127 K 3.9> 3.4> 3.7> 3.7>4.3 Phos 2.0> 2.6> 3.4> 3.8> 3.9>3.9 Mag 1.6> 1.5> 1.7> 1.7>1.9 Renal: WNL Hepatic: Tbili 1.4   Albumin 2.2, ast/alt: 48/19 (up) Intake / Output;  I/O 1230/800  - question accuracy given multiple urine occurrences with only 100cc out  JP drain : 549mls> 465mls> 493ml> 400 ml  GI Imaging/ Procedures:  7/27:  ex lap, gastrorrhaphy, omental patch, liver biopsy, and umbilical hernia repair.  8/1 UGI: no extravasation or leak noted  Central access: PICC placement 7/28 TPN start date: 7/28  Nutritional Goals: TPN will provide Protein 81g, CHO, 979KCal, Fat, 360 Kcal for total of 1663kcal/day Patient will meet greater than or equal to 90% of their needs   RD Assessment:  Ideal Body Weight:  50 kg  BMI:  Body mass index is 21.95 kg/m.  Estimated Nutritional Needs:   Kcal:  1600-1800  Protein:  75-90 gm  Fluid:  1.6-1.8 L   Current Nutrition:  Clears/thin  Plan:  No additional k/mg needed this am Continue  TPN at 75 mL/hr at 1800 Electrolytes in TPN: Na 40mEq/L, K 50mEq/L, Ca 78mEq/L, Mg 84mEq/L, and Phos 10mmol/L. Cl:Ac 1:1 Add standard MVI and trace elements to TPN Continue Sensitive SSI q8 hours as diet advances Monitor TPN labs  Plan to wean TPN once patient able to tolerate more solid foods  Dempsey Blush PharmD., BCPS Clinical Pharmacist 08/11/2023 7:55 AM

## 2023-08-11 NOTE — Progress Notes (Signed)
   08/11/23 0118  Assess: MEWS Score  Temp 99.6 F (37.6 C)  BP (!) 94/57  MAP (mmHg) 69  Pulse Rate 89  Resp 12  SpO2 98 %  O2 Device Room Air  Assess: MEWS Score  MEWS Temp 0  MEWS Systolic 1  MEWS Pulse 0  MEWS RR 1  MEWS LOC 0  MEWS Score 2  MEWS Score Color Yellow  Assess: if the MEWS score is Yellow or Red  Were vital signs accurate and taken at a resting state? Yes  Does the patient meet 2 or more of the SIRS criteria? Yes  Does the patient have a confirmed or suspected source of infection? No  MEWS guidelines implemented  Yes, yellow  Treat  MEWS Interventions Considered administering scheduled or prn medications/treatments as ordered  Take Vital Signs  Increase Vital Sign Frequency  Yellow: Q2hr x1, continue Q4hrs until patient remains green for 12hrs  Escalate  MEWS: Escalate Yellow: Discuss with charge nurse and consider notifying provider and/or RRT  Notify: Charge Nurse/RN  Name of Charge Nurse/RN Notified Meghan RN  Provider Notification  Provider Name/Title MD Arrien  Date Provider Notified 08/11/23  Time Provider Notified 0128  Method of Notification Page (Secure chat)  Notification Reason Other (Comment) (Yellow MEWs)  Provider response No new orders  Date of Provider Response 08/11/23  Time of Provider Response 0137  Assess: SIRS CRITERIA  SIRS Temperature  0  SIRS Respirations  0  SIRS Pulse 0  SIRS WBC 0  SIRS Score Sum  0

## 2023-08-11 NOTE — Progress Notes (Signed)
 6 Days Post-Op  Subjective: Patient still has mild incisional pain.  Is passing gas.  Tolerating clear liquid diet well.  Objective: Vital signs in last 24 hours: Temp:  [97.7 F (36.5 C)-100.1 F (37.8 C)] 98.2 F (36.8 C) (08/02 0742) Pulse Rate:  [83-98] 83 (08/02 0742) Resp:  [12-20] 20 (08/02 0359) BP: (94-123)/(55-76) 106/55 (08/02 0742) SpO2:  [96 %-100 %] 99 % (08/02 0742) Weight:  [55.6 kg] 55.6 kg (08/02 0359) Last BM Date : 08/10/23  Intake/Output from previous day: 08/01 0701 - 08/02 0700 In: 1229.9 [I.V.:1070.4; NG/GT:60; IV Piggyback:99.4] Out: 800 [Urine:100; Emesis/NG output:300; Drains:400] Intake/Output this shift: No intake/output data recorded.  General appearance: alert, cooperative, and no distress Resp: clear to auscultation bilaterally Cardio: regular rate and rhythm, S1, S2 normal, no murmur, click, rub or gallop GI: Soft, incision healing well.  Still with clear yellow drainage from JP drain.  Lab Results:  Recent Labs    08/10/23 0349 08/11/23 0430  WBC 5.6 5.8  HGB 10.1* 9.9*  HCT 30.6* 31.2*  PLT 88* 99*   BMET Recent Labs    08/10/23 0349 08/11/23 0430  NA 125* 127*  K 3.7 4.3  CL 93* 92*  CO2 26 26  GLUCOSE 99 98  BUN 11 14  CREATININE <0.30* 0.35*  CALCIUM  7.9* 8.2*   PT/INR Recent Labs    08/09/23 1142  LABPROT 17.4*  INR 1.4*    Studies/Results: DG UGI W SINGLE CM (SOL OR THIN BA) Result Date: 08/10/2023 CLINICAL DATA:  Status post surgical repair of perforated gastric ulcer. Request for limited upper GI to evaluate for leak. EXAM: DG UGI W SINGLE CM TECHNIQUE: Single contrast examination was then performed using water-soluble contrast. Contrast injected down patient's existing nasogastric tube into the stomach. This exam was performed by Franky Rusk PA-C , and was supervised and interpreted by Dr. Ester Sides. FLUOROSCOPY: Radiation Exposure Index (as provided by the fluoroscopic device): 44.50 mGy Kerma COMPARISON:   None Available. FINDINGS: Midline surgical staples consistent with open abdominal surgery. Nasogastric tube with tip terminating in the body of the stomach. Surgical drain noted overlying the expected surgical site. Contrast fills the stomach without difficulty. Images taken in AP, various obliquities, and right lateral decubitus positions. No definitive evidence of contrast extravasation from the expected surgical site. Normal filling of the pylorus with prompt gastric emptying. Normal caliber duodenum with active peristalsis. IMPRESSION: No definitive contrast extravasation or leak from expected gastric perforation surgical repair site. Electronically Signed   By: Ester Sides M.D.   On: 08/10/2023 11:37    Anti-infectives: Anti-infectives (From admission, onward)    Start     Dose/Rate Route Frequency Ordered Stop   08/05/23 1000  piperacillin -tazobactam (ZOSYN ) IVPB 3.375 g        3.375 g 12.5 mL/hr over 240 Minutes Intravenous Every 8 hours 08/05/23 0531 08/10/23 0957   08/05/23 0630  fluconazole  (DIFLUCAN ) IVPB 400 mg        400 mg 100 mL/hr over 120 Minutes Intravenous Every 24 hours 08/05/23 0533 08/09/23 1441   08/05/23 0600  cefoTEtan  (CEFOTAN ) 2 g in sodium chloride  0.9 % 100 mL IVPB        2 g 200 mL/hr over 30 Minutes Intravenous On call to O.R. 08/05/23 0138 08/05/23 0427   08/05/23 0253  sodium chloride  0.9 % with cefoTEtan  (CEFOTAN ) ADS Med       Note to Pharmacy: Joshua Planas S: cabinet override      08/05/23 0253  08/05/23 0359   08/05/23 0115  piperacillin -tazobactam (ZOSYN ) IVPB 3.375 g        3.375 g 100 mL/hr over 30 Minutes Intravenous  Once 08/05/23 0112 08/05/23 0155       Assessment/Plan: s/p Procedure(s): GASTRORRHAPHY LAPAROTOMY, EXPLORATORY BIOPSY, LIVER REPAIR, HERNIA, UMBILICAL, ADULT Impression: Postoperative day 6.  Patient overall is stable.  She is tolerating clear liquid diet, thus will advance to full liquid diet.  She is already supplemented with  boost p.o.  Continue TPN for next 24 hours.  Continue ambulating patient.  LOS: 6 days    Oneil Budge 08/11/2023

## 2023-08-11 NOTE — Plan of Care (Signed)

## 2023-08-11 NOTE — Progress Notes (Signed)
 PROGRESS NOTE   Joan Wood  FMW:[id number] DOB: 1970-08-19 DOA: 08/04/2023 PCP: Zarwolo, Gloria, FNP   Chief Complaint  Patient presents with   Abdominal Pain   Level of care: Telemetry  Brief Admission History:  Joan Wood is a 53 y.o. female with medical history significant of anxiety, GERD, cirrhosis, hypertension who presents emergency department due to abdominal pain.  On arrival she was afebrile and hemodynamically stable, labs showed sodium 122, potassium 3.3, creatinine 0.57, WBC 5.5, hemoglobin 10.4, INR 1.3.  Patient CT abdomen pelvis which showed perforated gastric ulcer.  Surgery was consulted and she was taken to the OR for exploratory laparotomy, omental patch and hernia repair on 08/05/23.  She was admitted to the ICU.  She is on TNA.    Assessment and Plan:  Perforated gastric ulcer - Treated surgically Gastric ulcer perforation with liver cirrhosis, umbilical hernia with incarcerated omental and recanalized umbilical vein  Postop day #6 -S/p exploratory laparotomy: gastrorrhaphy, omental patch, liver biopsy, primary umbilical hernia repair completed  -NG tube removed today per surgery team. UGI with no leaks seen.  -Continue ice chips for comfort - JP drain in place with serous output - diet advanced to FULL liquids per surgery team - plan to continue TPN for next 24 hours per surgeon  -Zosyn , and diflucan  completed on 7/31.   -Protonix  40 mg BID - IV muscle relaxants as needed  Foley catheter, postop-removed Holding chemical anticoagulation at this time  - TPN per surgery and pharm D - DC IVF - TPN to stop when able to tolerate solid foods per surgery   Hypotensive -- RESOLVED  - Postop blood pressure remained low-blood pressure improved - midodrine  discontinued  - Did not require pressors    Decompensated alcoholic cirrhosis -with ascites -trend LFTs closely monitor for peritonitis -No signs of alcohol withdrawal -Patient does not want her family  to know about her diagnosis alcoholic liver cirrhosis -GI consult appreciated    Hyponatremia (Chronic)  -Serum sodium: 126>>  128 >> 125 >>121>125>127 -Secondary to liver cirrhosis with chronic alcohol abuse likely related to alcohol intake.   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, and malnutrition -Continue TPN -diuretics on hold until her sodium improves better per GI team   Hypomagnesemia -- IV replacement ordered and being repleted by pharm D team  -- recheck in AM with TPN labs   Acute on chronic anemia -iron deficiency - Acute on chronic anemia with iron deficiency, liver cirrhosis, progressive decline postop hemoglobin -symptomatic with hypotension -Hemoglobin dropping from 10.4 >> 7.8 >> 7.1 - With hypotension-  s/p 2U PRBC blood transfusion -   Iron/TIBC/Ferritin/ %Sat Labs (Brief)          Component Value Date/Time    IRON 8 (L) 08/06/2023 0419    IRON 73 04/11/2023 1455    TIBC 310 08/06/2023 0419    TIBC 373 04/11/2023 1455    FERRITIN 266 05/12/2023 0930    FERRITIN 165 (H) 04/11/2023 1455    IRONPCTSAT 3 (L) 08/06/2023 0419    IRONPCTSAT 20 04/11/2023 1455    B12 at 757, folate at 6.5   -08/07/2023 2U PRBC blood transfusion -GI has been consulted for liver cirrhosis and ascites appreciate input regarding anemia  DVT prophylaxis: SCDs Code Status: Full  Family Communication:  Disposition:TBD    Consultants:  Surgery Pharmacy   Procedures:  08/05/23 Exp lap, omental patch, liver biopsy, umbilical hernia repair    Subjective:  Pt complaining of abdominal pain wanting more pain meds, given IV toradol  x 1 in addition to prescribed morphine    Objective: Vitals:   08/11/23 0118 08/11/23 0359 08/11/23 0742 08/11/23 1316  BP: (!) 94/57 121/76 (!) 106/55 100/63  Pulse: 89 93 83 92  Resp: 12 20    Temp: 99.6 F (37.6 C) 100.1 F (37.8 C) 98.2 F (36.8 C) 99.3 F (37.4 C)  TempSrc:  Oral Oral Oral   SpO2: 98% 99% 99% 98%  Weight:  55.6 kg    Height:        Intake/Output Summary (Last 24 hours) at 08/11/2023 1510 Last data filed at 08/11/2023 1000 Gross per 24 hour  Intake 1000.81 ml  Output 385 ml  Net 615.81 ml   Filed Weights   08/08/23 0422 08/09/23 0527 08/11/23 0359  Weight: 61.7 kg 60.1 kg 55.6 kg   Examination:  General exam: Appears calm and comfortable, NG tube removed. TPN ongoing.    Respiratory system: Clear to auscultation. Respiratory effort normal. Cardiovascular system: normal S1 & S2 heard. No JVD, murmurs, rubs, gallops or clicks. No pedal edema. Gastrointestinal system: JP drain in place, wound c/d/I.  Abdomen is nondistended, soft and tender. No organomegaly or masses felt. Normal bowel sounds heard. Central nervous system: Alert and oriented. No focal neurological deficits. Extremities: Symmetric 5 x 5 power. Skin: No rashes, lesions or ulcers. Psychiatry: Judgement and insight appear normal. Mood & affect appropriate.   Data Reviewed: I have personally reviewed following labs and imaging studies  CBC: Recent Labs  Lab 08/06/23 0419 08/07/23 0302 08/07/23 1927 08/09/23 0322 08/10/23 0349 08/11/23 0430  WBC 5.4 5.0  --  5.5 5.6 5.8  HGB 7.8* 7.1* 10.3* 10.3* 10.1* 9.9*  HCT 24.4* 22.7* 30.9* 30.2* 30.6* 31.2*  MCV 92.8 93.4  --  87.5 88.4 90.2  PLT 76* 76*  --  81* 88* 99*    Basic Metabolic Panel: Recent Labs  Lab 08/07/23 0302 08/08/23 0423 08/09/23 0322 08/10/23 0349 08/11/23 0430  NA 125* 121* 125* 125* 127*  K 3.9 3.4* 3.7 3.7 4.3  CL 95* 89* 94* 93* 92*  CO2 25 26 25 26 26   GLUCOSE 107* 100* 105* 99 98  BUN 6 7 10 11 14   CREATININE 0.49 <0.30* 0.36* <0.30* 0.35*  CALCIUM  7.8* 7.7* 7.8* 7.9* 8.2*  MG 1.6* 1.5* 1.7 1.7 1.9  PHOS 2.6 3.4 3.8 3.9 3.9    CBG: Recent Labs  Lab 08/10/23 0838 08/10/23 1638 08/10/23 2040 08/11/23 0011 08/11/23 0744  GLUCAP 114* 114* 96 104* 115*    Recent Results (from the past 240 hours)   Aerobic/Anaerobic Culture w Gram Stain (surgical/deep wound)     Status: None   Collection Time: 08/05/23  4:56 AM   Specimen: Path fluid; Body Fluid  Result Value Ref Range Status   Specimen Description   Final    FLUID Performed at [facility], [address], [place], [name] [address]    Special Requests   Final    NONE Performed at [facility], [address] [place], [name] [address]    Gram Stain   Final    RARE WBC SEEN RARE GRAM POSITIVE COCCI RARE GRAM NEGATIVE RODS    Culture   Final    FEW MULTIPLE ORGANISMS PRESENT, NONE PREDOMINANT NO GROUP A STREP (S.PYOGENES) ISOLATED NO STAPHYLOCOCCUS AUREUS ISOLATED NO ANAEROBES ISOLATED Performed at [facility], [address], [place], [name] [address]    Report  Status 08/10/2023 FINAL  Final  MRSA Next Gen by PCR, Nasal     Status: None   Collection Time: 08/05/23  7:53 AM   Specimen: Nasal Mucosa; Nasal Swab  Result Value Ref Range Status   MRSA by PCR Next Gen NOT DETECTED NOT DETECTED Final    Comment: (NOTE) The GeneXpert MRSA Assay (FDA approved for NASAL specimens only), is one component of a comprehensive MRSA colonization surveillance program. It is not intended to diagnose MRSA infection nor to guide or monitor treatment for MRSA infections. Test performance is not FDA approved in patients less than 47 years old. Performed at [facility], [address], [place], [name] [address]      Radiology Studies: DG UGI W SINGLE CM (SOL OR THIN BA) Result Date: 08/10/2023 CLINICAL DATA:  Status post surgical repair of perforated gastric ulcer. Request for limited upper GI to evaluate for leak. EXAM: DG UGI W SINGLE CM TECHNIQUE: Single contrast examination was then performed using water-soluble contrast. Contrast injected down patient's existing nasogastric tube into the stomach. This exam was performed by Franky Rusk PA-C , and was supervised and interpreted by Dr. Ester Sides. FLUOROSCOPY: Radiation  Exposure Index (as provided by the fluoroscopic device): 44.50 mGy Kerma COMPARISON:  None Available. FINDINGS: Midline surgical staples consistent with open abdominal surgery. Nasogastric tube with tip terminating in the body of the stomach. Surgical drain noted overlying the expected surgical site. Contrast fills the stomach without difficulty. Images taken in AP, various obliquities, and right lateral decubitus positions. No definitive evidence of contrast extravasation from the expected surgical site. Normal filling of the pylorus with prompt gastric emptying. Normal caliber duodenum with active peristalsis. IMPRESSION: No definitive contrast extravasation or leak from expected gastric perforation surgical repair site. Electronically Signed   By: Ester Sides M.D.   On: 08/10/2023 11:37    Scheduled Meds:  bisacodyl   10 mg Rectal Daily   chlorhexidine   15 mL Mouth/Throat Once   Chlorhexidine  Gluconate Cloth  6 each Topical Daily   feeding supplement  1 Container Oral TID BM   folic acid   1 mg Oral Daily   insulin  aspart  0-9 Units Subcutaneous Q8H   nicotine   14 mg Transdermal Daily   pantoprazole  (PROTONIX ) IV  40 mg Intravenous Q12H   sertraline   25 mg Oral Daily   sodium chloride  flush  10-40 mL Intracatheter Q12H   sucralfate   1 g Oral TID WC & HS   Continuous Infusions:  TPN ADULT (ION) 75 mL/hr at 08/11/23 0406   TPN ADULT (ION)       LOS: 6 days   Time spent: 55 mins   Tywon Niday Vicci, MD How to contact the [facility] Attending or Consulting provider 7A - 7P or covering provider during after hours 7P -7A, for this patient?  Check the care team in [facility] and look for a) attending/consulting TR[facility] provider listed and b) the [facility] team listed Log into [email or web address] to find provider on call.  Locate the [facility] provider you are looking for under [facility] and page to a number that you can be directly reached. If you still have difficulty reaching the provider, please page the [facility]  (Director on Call) for the Hospitalists listed on amion for assistance.  08/11/2023, 3:10 PM

## 2023-08-12 DIAGNOSIS — K7030 Alcoholic cirrhosis of liver without ascites: Secondary | ICD-10-CM | POA: Diagnosis not present

## 2023-08-12 DIAGNOSIS — R198 Other specified symptoms and signs involving the digestive system and abdomen: Secondary | ICD-10-CM | POA: Diagnosis not present

## 2023-08-12 DIAGNOSIS — K2960 Other gastritis without bleeding: Secondary | ICD-10-CM | POA: Diagnosis not present

## 2023-08-12 DIAGNOSIS — T39395A Adverse effect of other nonsteroidal anti-inflammatory drugs [NSAID], initial encounter: Secondary | ICD-10-CM | POA: Diagnosis not present

## 2023-08-12 LAB — COMPREHENSIVE METABOLIC PANEL WITH GFR
ALT: 28 U/L (ref 0–44)
AST: 66 U/L — ABNORMAL HIGH (ref 15–41)
Albumin: 1.8 g/dL — ABNORMAL LOW (ref 3.5–5.0)
Alkaline Phosphatase: 83 U/L (ref 38–126)
Anion gap: 6 (ref 5–15)
BUN: 17 mg/dL (ref 6–20)
CO2: 24 mmol/L (ref 22–32)
Calcium: 8.2 mg/dL — ABNORMAL LOW (ref 8.9–10.3)
Chloride: 94 mmol/L — ABNORMAL LOW (ref 98–111)
Creatinine, Ser: 0.31 mg/dL — ABNORMAL LOW (ref 0.44–1.00)
GFR, Estimated: >60 mL/min (ref >60)
Glucose, Bld: 104 mg/dL — ABNORMAL HIGH (ref 70–99)
Potassium: 4.9 mmol/L (ref 3.5–5.1)
Sodium: 124 mmol/L — ABNORMAL LOW (ref 135–145)
Total Bilirubin: 1.1 mg/dL (ref 0.0–1.2)
Total Protein: 5.4 g/dL — ABNORMAL LOW (ref 6.5–8.1)

## 2023-08-12 LAB — CBC
HCT: 29.7 % — ABNORMAL LOW (ref 36.0–46.0)
Hemoglobin: 9.5 g/dL — ABNORMAL LOW (ref 12.0–15.0)
MCH: 28.7 pg (ref 26.0–34.0)
MCHC: 32 g/dL (ref 30.0–36.0)
MCV: 89.7 fL (ref 80.0–100.0)
Platelets: 113 K/uL — ABNORMAL LOW (ref 150–400)
RBC: 3.31 MIL/uL — ABNORMAL LOW (ref 3.87–5.11)
RDW: 16.9 % — ABNORMAL HIGH (ref 11.5–15.5)
WBC: 6.9 K/uL (ref 4.0–10.5)
nRBC: 0 % (ref 0.0–0.2)

## 2023-08-12 LAB — GLUCOSE, CAPILLARY
Glucose-Capillary: 83 mg/dL (ref 70–99)
Glucose-Capillary: 81 mg/dL (ref 70–99)
Glucose-Capillary: 77 mg/dL (ref 70–99)

## 2023-08-12 LAB — MAGNESIUM: Magnesium: 1.9 mg/dL (ref 1.7–2.4)

## 2023-08-12 MED ORDER — INSULIN ASPART 100 UNIT/ML IJ SOLN: 0.0000 [IU] | Freq: Three times a day (TID) | INTRAMUSCULAR | Status: DC

## 2023-08-12 MED ORDER — PANTOPRAZOLE SODIUM 40 MG PO TBEC
40.0000 mg | DELAYED_RELEASE_TABLET | Freq: Two times a day (BID) | ORAL | Status: DC
  Administered 2023-08-12 – 2023-08-14 (×5): 40 mg via ORAL
  Filled 2023-08-12 (×5): qty 1

## 2023-08-12 NOTE — Plan of Care (Signed)

## 2023-08-12 NOTE — Progress Notes (Signed)
 Nurse at bedside,patient assisted to and from bathroom times one assist using front wheel walker, patient did well with ambulation. Plan of care on going.

## 2023-08-12 NOTE — Progress Notes (Signed)
 PROGRESS NOTE   IRETTA MANGRUM  FMW:[id number] DOB: 21-Dec-1970 DOA: 08/04/2023 PCP: Zarwolo, Gloria, FNP   Chief Complaint  Patient presents with   Abdominal Pain   Level of care: Telemetry  Brief Admission History:  CARLIN MAMONE is a 53 y.o. female with medical history significant of anxiety, GERD, cirrhosis, hypertension who presents emergency department due to abdominal pain.  On arrival she was afebrile and hemodynamically stable, labs showed sodium 122, potassium 3.3, creatinine 0.57, WBC 5.5, hemoglobin 10.4, INR 1.3.  Patient CT abdomen pelvis which showed perforated gastric ulcer.  Surgery was consulted and she was taken to the OR for exploratory laparotomy, omental patch and hernia repair on 08/05/23.  She was admitted to the ICU.  She is on TNA.    Assessment and Plan:  Perforated gastric ulcer - Treated surgically Gastric ulcer perforation with liver cirrhosis, umbilical hernia with incarcerated omental and recanalized umbilical vein  Postop day #7 - S/p exploratory laparotomy: gastrorrhaphy, omental patch, liver biopsy, primary umbilical hernia repair completed  - NG tube removed. UGI with no leaks seen.  - diet advanced to heart healthy 8/3 - JP drain in place with serous output - discontinue TPN per surgeon 8/3 - Zosyn  and diflucan  completed on 7/31  - Protonix  40 mg BID - IV muscle relaxants as needed  Foley catheter, postop-removed Holding chemical anticoagulation at this time PT eval requested, ambulate to chair, in room, hall   Hypotensive -- RESOLVED  - Postop blood pressure remained low-blood pressure improved - midodrine  discontinued  - Did not require pressors    Decompensated alcoholic cirrhosis -with ascites -LFTs stable -No signs of alcohol withdrawal -Patient does not want her family to know about her diagnosis alcoholic liver cirrhosis -GI consult appreciated    Hyponatremia (Chronic)  --Serum sodium: 126>>  128 >> 125  >>121>125>127>124 -Secondary to liver cirrhosis with chronic alcohol abuse likely related to alcohol intake.   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, and malnutrition --TPN discontinued on 8/3 --diuretics on hold until her sodium improves better per GI team   Hypomagnesemia -- IV replacement ordered and repleted  Acute on chronic anemia -iron deficiency - Acute on chronic anemia with iron deficiency, liver cirrhosis, progressive decline postop hemoglobin -symptomatic with hypotension - Hemoglobin 9.9 stable - s/p 2U PRBC blood transfusion on 08/07/23 -GI has been consulted for liver cirrhosis and ascites appreciate input regarding anemia  DVT prophylaxis: SCDs Code Status: Full  Family Communication:  Disposition:TBD    Consultants:  Surgery Pharmacy   Procedures:  08/05/23 Exp lap, omental patch, liver biopsy, umbilical hernia repair   Subjective: Continues to complain of abd pain wanting IV morphine , and asking for nausea meds, agreeable to advancing diet to heart healthy  Objective: Vitals:   08/11/23 1316 08/11/23 1950 08/12/23 0535 08/12/23 0918  BP: 100/63 124/76 (!) 93/58 95/62  Pulse: 92 90 91 99  Resp:  20 18   Temp: 99.3 F (37.4 C) 98.3 F (36.8 C) 98.2 F (36.8 C) 98.4 F (36.9 C)  TempSrc: Oral Oral Oral Oral  SpO2: 98% 99% 99% 99%  Weight:   57.3 kg   Height:        Intake/Output Summary (Last 24 hours) at 08/12/2023 0957 Last data filed at 08/12/2023 0240 Gross per 24 hour  Intake 1663.63 ml  Output 165 ml  Net 1498.63 ml   Filed Weights   08/09/23 0527 08/11/23 0359 08/12/23 0535  Weight: 60.1 kg 55.6 kg 57.3 kg  Examination:  General exam: Appears calm and comfortable. NAD.    Respiratory system: Clear to auscultation. Respiratory effort normal. Cardiovascular system: normal S1 & S2 heard. No JVD, murmurs, rubs, gallops or clicks. No pedal edema. Gastrointestinal system: JP drain in place, wound c/d/I.  Abdomen is nondistended, soft and  minimally tender to my exam. No organomegaly or masses felt. Normal bowel sounds heard. Central nervous system: Alert and oriented. No focal neurological deficits. Extremities: Symmetric 5 x 5 power. Skin: No rashes, lesions or ulcers. Psychiatry: Judgement and insight appear normal. Mood & affect appropriate.   Data Reviewed: I have personally reviewed following labs and imaging studies  CBC: Recent Labs  Lab 08/07/23 0302 08/07/23 1927 08/09/23 0322 08/10/23 0349 08/11/23 0430 08/12/23 0448  WBC 5.0  --  5.5 5.6 5.8 6.9  HGB 7.1* 10.3* 10.3* 10.1* 9.9* 9.5*  HCT 22.7* 30.9* 30.2* 30.6* 31.2* 29.7*  MCV 93.4  --  87.5 88.4 90.2 89.7  PLT 76*  --  81* 88* 99* 113*    Basic Metabolic Panel: Recent Labs  Lab 08/07/23 0302 08/08/23 0423 08/09/23 0322 08/10/23 0349 08/11/23 0430 08/12/23 0448  NA 125* 121* 125* 125* 127* 124*  K 3.9 3.4* 3.7 3.7 4.3 4.9  CL 95* 89* 94* 93* 92* 94*  CO2 25 26 25 26 26 24   GLUCOSE 107* 100* 105* 99 98 104*  BUN 6 7 10 11 14 17   CREATININE 0.49 <0.30* 0.36* <0.30* 0.35* 0.31*  CALCIUM  7.8* 7.7* 7.8* 7.9* 8.2* 8.2*  MG 1.6* 1.5* 1.7 1.7 1.9 1.9  PHOS 2.6 3.4 3.8 3.9 3.9  --     CBG: Recent Labs  Lab 08/11/23 0011 08/11/23 0744 08/11/23 1617 08/11/23 2353 08/12/23 0737  GLUCAP 104* 115* 101* 113* 83    Recent Results (from the past 240 hours)  Aerobic/Anaerobic Culture w Gram Stain (surgical/deep wound)     Status: None   Collection Time: 08/05/23  4:56 AM   Specimen: Path fluid; Body Fluid  Result Value Ref Range Status   Specimen Description   Final    FLUID Performed at [facility], [address] [place], [name] [address]    Special Requests   Final    NONE Performed at [facility], [address], [place], [name] [address]    Gram Stain   Final    RARE WBC SEEN RARE GRAM POSITIVE COCCI RARE GRAM NEGATIVE RODS    Culture   Final    FEW MULTIPLE ORGANISMS PRESENT, NONE PREDOMINANT NO GROUP A STREP (S.PYOGENES)  ISOLATED NO STAPHYLOCOCCUS AUREUS ISOLATED NO ANAEROBES ISOLATED Performed at [facility], [address], [place], [name] [address]    Report Status 08/10/2023 FINAL  Final  MRSA Next Gen by PCR, Nasal     Status: None   Collection Time: 08/05/23  7:53 AM   Specimen: Nasal Mucosa; Nasal Swab  Result Value Ref Range Status   MRSA by PCR Next Gen NOT DETECTED NOT DETECTED Final    Comment: (NOTE) The GeneXpert MRSA Assay (FDA approved for NASAL specimens only), is one component of a comprehensive MRSA colonization surveillance program. It is not intended to diagnose MRSA infection nor to guide or monitor treatment for MRSA infections. Test performance is not FDA approved in patients less than 37 years old. Performed at [facility], [address] [place], [name] [address]      Radiology Studies: DG UGI W SINGLE CM (SOL OR THIN BA) Result Date: 08/10/2023 CLINICAL  DATA:  Status post surgical repair of perforated gastric ulcer. Request for limited upper GI to evaluate for leak. EXAM: DG UGI W SINGLE CM TECHNIQUE: Single contrast examination was then performed using water-soluble contrast. Contrast injected down patient's existing nasogastric tube into the stomach. This exam was performed by Franky Rusk PA-C , and was supervised and interpreted by Dr. Ester Sides. FLUOROSCOPY: Radiation Exposure Index (as provided by the fluoroscopic device): 44.50 mGy Kerma COMPARISON:  None Available. FINDINGS: Midline surgical staples consistent with open abdominal surgery. Nasogastric tube with tip terminating in the body of the stomach. Surgical drain noted overlying the expected surgical site. Contrast fills the stomach without difficulty. Images taken in AP, various obliquities, and right lateral decubitus positions. No definitive evidence of contrast extravasation from the expected surgical site. Normal filling of the pylorus with prompt gastric emptying. Normal caliber duodenum with active  peristalsis. IMPRESSION: No definitive contrast extravasation or leak from expected gastric perforation surgical repair site. Electronically Signed   By: Ester Sides M.D.   On: 08/10/2023 11:37    Scheduled Meds:  bisacodyl   10 mg Rectal Daily   Chlorhexidine  Gluconate Cloth  6 each Topical Daily   feeding supplement  1 Container Oral TID BM   folic acid   1 mg Oral Daily   insulin  aspart  0-9 Units Subcutaneous Q8H   pantoprazole   40 mg Oral BID   sertraline   25 mg Oral Daily   sodium chloride  flush  10-40 mL Intracatheter Q12H   sucralfate   1 g Oral TID WC & HS   Continuous Infusions:   LOS: 7 days   Time spent: 55 mins   Aileena Iglesia Vicci, MD How to contact the [facility] Attending or Consulting provider 7A - 7P or covering provider during after hours 7P -7A, for this patient?  Check the care team in [facility] and look for a) attending/consulting [facility] provider listed and b) the [facility] team listed Log into [email or web address] to find provider on call.  Locate the [facility] provider you are looking for under [facility] and page to a number that you can be directly reached. If you still have difficulty reaching the provider, please page the [facility] (Director on Call) for the Hospitalists listed on amion for assistance.  08/12/2023, 9:57 AM

## 2023-08-12 NOTE — Progress Notes (Signed)
 Patient's sodium level 124 this am, Dr Afton Louder notified.Plan of care on going.

## 2023-08-12 NOTE — Progress Notes (Signed)
 7 Days Post-Op  Subjective: Patient feels a little bit better.  States her abdominal pain has decreased.  She has had multiple bowel movements.  Objective: Vital signs in last 24 hours: Temp:  [98.2 F (36.8 C)-99.3 F (37.4 C)] 98.2 F (36.8 C) (08/03 0535) Pulse Rate:  [83-92] 91 (08/03 0535) Resp:  [18-20] 18 (08/03 0535) BP: (93-124)/(55-76) 93/58 (08/03 0535) SpO2:  [98 %-99 %] 99 % (08/03 0535) Weight:  [57.3 kg] 57.3 kg (08/03 0535) Last BM Date : 08/10/23  Intake/Output from previous day: 08/02 0701 - 08/03 0700 In: 1903.6 [P.O.:240; I.V.:1663.6] Out: 165 [Drains:165] Intake/Output this shift: No intake/output data recorded.  General appearance: alert, cooperative, and no distress GI: Soft, incision healing well.  Bowel sounds active.  JP drainage serous.  Lab Results:  Recent Labs    08/11/23 0430 08/12/23 0448  WBC 5.8 6.9  HGB 9.9* 9.5*  HCT 31.2* 29.7*  PLT 99* 113*   BMET Recent Labs    08/11/23 0430 08/12/23 0448  NA 127* 124*  K 4.3 4.9  CL 92* 94*  CO2 26 24  GLUCOSE 98 104*  BUN 14 17  CREATININE 0.35* 0.31*  CALCIUM  8.2* 8.2*   PT/INR Recent Labs    08/09/23 1142  LABPROT 17.4*  INR 1.4*    Studies/Results: DG UGI W SINGLE CM (SOL OR THIN BA) Result Date: 08/10/2023 CLINICAL DATA:  Status post surgical repair of perforated gastric ulcer. Request for limited upper GI to evaluate for leak. EXAM: DG UGI W SINGLE CM TECHNIQUE: Single contrast examination was then performed using water-soluble contrast. Contrast injected down patient's existing nasogastric tube into the stomach. This exam was performed by Franky Rusk PA-C , and was supervised and interpreted by Dr. Ester Sides. FLUOROSCOPY: Radiation Exposure Index (as provided by the fluoroscopic device): 44.50 mGy Kerma COMPARISON:  None Available. FINDINGS: Midline surgical staples consistent with open abdominal surgery. Nasogastric tube with tip terminating in the body of the stomach.  Surgical drain noted overlying the expected surgical site. Contrast fills the stomach without difficulty. Images taken in AP, various obliquities, and right lateral decubitus positions. No definitive evidence of contrast extravasation from the expected surgical site. Normal filling of the pylorus with prompt gastric emptying. Normal caliber duodenum with active peristalsis. IMPRESSION: No definitive contrast extravasation or leak from expected gastric perforation surgical repair site. Electronically Signed   By: Ester Sides M.D.   On: 08/10/2023 11:37    Anti-infectives: Anti-infectives (From admission, onward)    Start     Dose/Rate Route Frequency Ordered Stop   08/05/23 1000  piperacillin -tazobactam (ZOSYN ) IVPB 3.375 g        3.375 g 12.5 mL/hr over 240 Minutes Intravenous Every 8 hours 08/05/23 0531 08/10/23 0957   08/05/23 0630  fluconazole  (DIFLUCAN ) IVPB 400 mg        400 mg 100 mL/hr over 120 Minutes Intravenous Every 24 hours 08/05/23 0533 08/09/23 1441   08/05/23 0600  cefoTEtan  (CEFOTAN ) 2 g in sodium chloride  0.9 % 100 mL IVPB        2 g 200 mL/hr over 30 Minutes Intravenous On call to O.R. 08/05/23 0138 08/05/23 0427   08/05/23 0253  sodium chloride  0.9 % with cefoTEtan  (CEFOTAN ) ADS Med       Note to Pharmacy: Joshua Planas S: cabinet override      08/05/23 0253 08/05/23 0359   08/05/23 0115  piperacillin -tazobactam (ZOSYN ) IVPB 3.375 g        3.375 g  100 mL/hr over 30 Minutes Intravenous  Once 08/05/23 0112 08/05/23 0155       Assessment/Plan: s/p Procedure(s): GASTRORRHAPHY LAPAROTOMY, EXPLORATORY BIOPSY, LIVER REPAIR, HERNIA, UMBILICAL, ADULT Impression: Postoperative day 7.  Bowel function has returned.  Her JP drain has decreased in amount over the past 24 hours.  She is hyponatremic.  Will advance to heart healthy diet.  Will stop TPN.  LOS: 7 days    Oneil Budge 08/12/2023

## 2023-08-12 NOTE — Progress Notes (Signed)
 Nurse at bedside,patient tearful this morning c/o severe abdominal pain related to surgery ,rated pain a 10,sharp,and shooting pain that's constant. Morphine  2 mg's given IV per MAR prn orders.Patient assisted back to bed .Patient alert and oriented times four. Plan of care on going.

## 2023-08-12 NOTE — Progress Notes (Addendum)
 Nurse at bedside,patient c/o severe abdominal pain rated a 10,constant,shooting type of pain.Morphine  2 mg's given IV per MAR prn. Plan of care on going.

## 2023-08-12 NOTE — Progress Notes (Signed)
 Nurse at bedside,patient c/o nausea at this time. Zofran  4 mg's given IV for nausea per MAR prn.Plan of care on going.

## 2023-08-13 ENCOUNTER — Inpatient Hospital Stay (HOSPITAL_COMMUNITY)

## 2023-08-13 DIAGNOSIS — F1090 Alcohol use, unspecified, uncomplicated: Secondary | ICD-10-CM | POA: Diagnosis not present

## 2023-08-13 DIAGNOSIS — R188 Other ascites: Secondary | ICD-10-CM | POA: Diagnosis not present

## 2023-08-13 DIAGNOSIS — D62 Acute posthemorrhagic anemia: Secondary | ICD-10-CM | POA: Diagnosis not present

## 2023-08-13 DIAGNOSIS — D649 Anemia, unspecified: Secondary | ICD-10-CM | POA: Diagnosis not present

## 2023-08-13 DIAGNOSIS — R198 Other specified symptoms and signs involving the digestive system and abdomen: Secondary | ICD-10-CM | POA: Diagnosis not present

## 2023-08-13 DIAGNOSIS — K275 Chronic or unspecified peptic ulcer, site unspecified, with perforation: Secondary | ICD-10-CM | POA: Diagnosis not present

## 2023-08-13 DIAGNOSIS — E440 Moderate protein-calorie malnutrition: Secondary | ICD-10-CM | POA: Diagnosis not present

## 2023-08-13 DIAGNOSIS — R109 Unspecified abdominal pain: Secondary | ICD-10-CM | POA: Diagnosis not present

## 2023-08-13 DIAGNOSIS — K7689 Other specified diseases of liver: Secondary | ICD-10-CM | POA: Diagnosis not present

## 2023-08-13 DIAGNOSIS — K769 Liver disease, unspecified: Secondary | ICD-10-CM | POA: Diagnosis not present

## 2023-08-13 DIAGNOSIS — K7460 Unspecified cirrhosis of liver: Secondary | ICD-10-CM | POA: Diagnosis not present

## 2023-08-13 DIAGNOSIS — K668 Other specified disorders of peritoneum: Secondary | ICD-10-CM | POA: Diagnosis not present

## 2023-08-13 LAB — CBC
HCT: 27.7 % — ABNORMAL LOW (ref 36.0–46.0)
Hemoglobin: 9.2 g/dL — ABNORMAL LOW (ref 12.0–15.0)
MCH: 29.5 pg (ref 26.0–34.0)
MCHC: 33.2 g/dL (ref 30.0–36.0)
MCV: 88.8 fL (ref 80.0–100.0)
Platelets: 134 K/uL — ABNORMAL LOW (ref 150–400)
RBC: 3.12 MIL/uL — ABNORMAL LOW (ref 3.87–5.11)
RDW: 17.1 % — ABNORMAL HIGH (ref 11.5–15.5)
WBC: 8.2 K/uL (ref 4.0–10.5)
nRBC: 0 % (ref 0.0–0.2)

## 2023-08-13 LAB — URINALYSIS, ROUTINE W REFLEX MICROSCOPIC
Bacteria, UA: NONE SEEN
Bilirubin Urine: NEGATIVE
Glucose, UA: NEGATIVE mg/dL
Ketones, ur: NEGATIVE mg/dL
Leukocytes,Ua: NEGATIVE
Nitrite: NEGATIVE
Protein, ur: NEGATIVE mg/dL
Specific Gravity, Urine: 1.036 — ABNORMAL HIGH (ref 1.005–1.030)
pH: 5 (ref 5.0–8.0)

## 2023-08-13 LAB — PROTIME-INR
INR: 1.4 — ABNORMAL HIGH (ref 0.8–1.2)
Prothrombin Time: 17.9 s — ABNORMAL HIGH (ref 11.4–15.2)

## 2023-08-13 LAB — PHOSPHORUS: Phosphorus: 4.6 mg/dL (ref 2.5–4.6)

## 2023-08-13 LAB — CYTOLOGY - NON PAP

## 2023-08-13 LAB — COMPREHENSIVE METABOLIC PANEL WITH GFR
ALT: 34 U/L (ref 0–44)
AST: 74 U/L — ABNORMAL HIGH (ref 15–41)
Albumin: 1.8 g/dL — ABNORMAL LOW (ref 3.5–5.0)
Alkaline Phosphatase: 87 U/L (ref 38–126)
Anion gap: 8 (ref 5–15)
BUN: 13 mg/dL (ref 6–20)
CO2: 23 mmol/L (ref 22–32)
Calcium: 7.9 mg/dL — ABNORMAL LOW (ref 8.9–10.3)
Chloride: 94 mmol/L — ABNORMAL LOW (ref 98–111)
Creatinine, Ser: 0.32 mg/dL — ABNORMAL LOW (ref 0.44–1.00)
GFR, Estimated: >60 mL/min (ref >60)
Glucose, Bld: 80 mg/dL (ref 70–99)
Potassium: 4.1 mmol/L (ref 3.5–5.1)
Sodium: 125 mmol/L — ABNORMAL LOW (ref 135–145)
Total Bilirubin: 1.7 mg/dL — ABNORMAL HIGH (ref 0.0–1.2)
Total Protein: 5.5 g/dL — ABNORMAL LOW (ref 6.5–8.1)

## 2023-08-13 LAB — GLUCOSE, CAPILLARY
Glucose-Capillary: 83 mg/dL (ref 70–99)
Glucose-Capillary: 82 mg/dL (ref 70–99)
Glucose-Capillary: 83 mg/dL (ref 70–99)

## 2023-08-13 LAB — MAGNESIUM: Magnesium: 1.6 mg/dL — ABNORMAL LOW (ref 1.7–2.4)

## 2023-08-13 MED ORDER — MORPHINE SULFATE (PF) 2 MG/ML IV SOLN
2.0000 mg | INTRAVENOUS | Status: DC | PRN
  Administered 2023-08-13 (×2): 2 mg via INTRAVENOUS
  Filled 2023-08-13 (×2): qty 1

## 2023-08-13 MED ORDER — OXYCODONE HCL 5 MG PO TABS
5.0000 mg | ORAL_TABLET | ORAL | Status: DC | PRN
  Administered 2023-08-13 – 2023-08-14 (×4): 5 mg via ORAL
  Filled 2023-08-13 (×4): qty 1

## 2023-08-13 MED ORDER — IOHEXOL 9 MG/ML PO SOLN
ORAL | Status: AC
  Filled 2023-08-13: qty 500

## 2023-08-13 MED ORDER — MAGNESIUM SULFATE 4 GM/100ML IV SOLN
4.0000 g | Freq: Once | INTRAVENOUS | Status: AC
  Administered 2023-08-13: 4 g via INTRAVENOUS
  Filled 2023-08-13: qty 100

## 2023-08-13 MED ORDER — IOHEXOL 300 MG/ML  SOLN
100.0000 mL | Freq: Once | INTRAMUSCULAR | Status: AC | PRN
  Administered 2023-08-13: 100 mL via INTRAVENOUS

## 2023-08-13 NOTE — Progress Notes (Signed)
 [facility] Progress Note  8 Days Post-Op  Subjective: Patient on heart healthy diet. JP drain output down but still high over 100. Having Bms. Having pain in the midline. Some leakage around the JP site with dampened abd pads.   Objective: Vital signs in last 24 hours: Temp:  [98.5 F (36.9 C)-99.5 F (37.5 C)] 98.8 F (37.1 C) (08/04 0819) Pulse Rate:  [95-100] 95 (08/04 0819) Resp:  [15] 15 (08/03 1352) BP: (95-115)/(54-75) 115/75 (08/04 0819) SpO2:  [97 %] 97 % (08/04 0819) Weight:  [56.5 kg] 56.5 kg (08/04 0531) Last BM Date : 08/12/23  Intake/Output from previous day: 08/03 0701 - 08/04 0700 In: 730 [P.O.:720; I.V.:10] Out: 270 [Drains:270] Intake/Output this shift: No intake/output data recorded.  General appearance: alert and no distress Resp: normal work of breathing GI: soft, tender around midline, indurated, but no obvious hernia, difficult to say if could have hernia formed, no obvious midline drainage of ascites, jp with serous fluid, abd were dampened with ascites but could have been form the around the JP   Lab Results:  Recent Labs    08/12/23 0448 08/13/23 0340  WBC 6.9 8.2  HGB 9.5* 9.2*  HCT 29.7* 27.7*  PLT 113* 134*   BMET Recent Labs    08/12/23 0448 08/13/23 0340  NA 124* 125*  K 4.9 4.1  CL 94* 94*  CO2 24 23  GLUCOSE 104* 80  BUN 17 13  CREATININE 0.31* 0.32*  CALCIUM  8.2* 7.9*   PT/INR No results for input(s): LABPROT, INR in the last 72 hours.  Studies/Results: No results found.  Anti-infectives: Anti-infectives (From admission, onward)    Start     Dose/Rate Route Frequency Ordered Stop   08/05/23 1000  piperacillin -tazobactam (ZOSYN ) IVPB 3.375 g        3.375 g 12.5 mL/hr over 240 Minutes Intravenous Every 8 hours 08/05/23 0531 08/10/23 0957   08/05/23 0630  fluconazole  (DIFLUCAN ) IVPB 400 mg        400 mg 100 mL/hr over 120 Minutes Intravenous Every 24 hours 08/05/23 0533 08/09/23 1441    08/05/23 0600  cefoTEtan  (CEFOTAN ) 2 g in sodium chloride  0.9 % 100 mL IVPB        2 g 200 mL/hr over 30 Minutes Intravenous On call to O.R. 08/05/23 0138 08/05/23 0427   08/05/23 0253  sodium chloride  0.9 % with cefoTEtan  (CEFOTAN ) ADS Med       Note to Pharmacy: Joshua Planas S: cabinet override      08/05/23 0253 08/05/23 0359   08/05/23 0115  piperacillin -tazobactam (ZOSYN ) IVPB 3.375 g        3.375 g 100 mL/hr over 30 Minutes Intravenous  Once 08/05/23 0112 08/05/23 0155       Assessment/Plan: Patient s/p Ex lap, gastrorrhaphy, liver biopsy, hernia repair for perforated gastric ulcer in setting of cirrhosis and hernia.   Given her ascites and potential she has hernia formed in her midline, will get a CT to ensure not missing hernia or some like a continued leak from perforation despite the UGI being negative.   Roxicodone  added, spaced out the morphine  Heart Healthy diet JP drain with output too high to remove No leukocytosis H&H stable Need JP drain education PT working with her  Future Appointments  Date Time Provider Department Center  08/22/2023 11:00 AM Kallie Manuelita BROCKS, MD [facility] None  11/29/2023 10:40 AM Branch, Dorn FALCON, MD [facility]   She has refused to let me call anyone  since the night of surgery when she told me to Call [place]. She has not wanted me to call any family or [place] since that night. I have asked multiple days and she refused.    LOS: 8 days    Manuelita JAYSON Pander 08/13/2023

## 2023-08-13 NOTE — Plan of Care (Signed)
   Problem: Education: Goal: Knowledge of General Education information will improve Description: Including pain rating scale, medication(s)/side effects and non-pharmacologic comfort measures Outcome: Progressing   Problem: Clinical Measurements: Goal: Will remain free from infection Outcome: Progressing   Problem: Activity: Goal: Risk for activity intolerance will decrease Outcome: Progressing

## 2023-08-13 NOTE — Progress Notes (Signed)
 Nurse at bedside,patient alert and oriented times four.Patient assisted to and from bathroom using the front wheel walker times one stand by assist,patient did well with ambulation.Plan of care on going.

## 2023-08-13 NOTE — Progress Notes (Signed)
 [facility]  CT without any ventral, hernia or large fluid collections under her midline. JP in place. Her pain is postoperative and muscular in nature.  Continue diet, physical therapy, and oral pain control. Hopefully home tomorrow.  Manuelita Pander MD

## 2023-08-13 NOTE — Plan of Care (Signed)
  Problem: Acute Rehab PT Goals(only PT should resolve) Goal: Pt Will Go Supine/Side To Sit Outcome: Progressing Flowsheets (Taken 08/13/2023 1220) Pt will go Supine/Side to Sit: with modified independence Goal: Patient Will Transfer Sit To/From Stand Outcome: Progressing Flowsheets (Taken 08/13/2023 1220) Patient will transfer sit to/from stand: with modified independence Goal: Pt Will Transfer Bed To Chair/Chair To Bed Outcome: Progressing Flowsheets (Taken 08/13/2023 1220) Pt will Transfer Bed to Chair/Chair to Bed: with modified independence Goal: Pt Will Ambulate Outcome: Progressing Flowsheets (Taken 08/13/2023 1220) Pt will Ambulate:  with modified independence  > 125 feet  with least restrictive assistive device

## 2023-08-13 NOTE — Progress Notes (Signed)
 PROGRESS NOTE   Joan Wood  FMW:[id number] DOB: October 10, 1970 DOA: 08/04/2023 PCP: Zarwolo, Gloria, FNP   Chief Complaint  Patient presents with   Abdominal Pain   Level of care: Telemetry  Brief Admission History:  Joan Wood is a 53 y.o. female with medical history significant of anxiety, GERD, cirrhosis, hypertension who presents emergency department due to abdominal pain.  On arrival she was afebrile and hemodynamically stable, labs showed sodium 122, potassium 3.3, creatinine 0.57, WBC 5.5, hemoglobin 10.4, INR 1.3.  Patient CT abdomen pelvis which showed perforated gastric ulcer.  Surgery was consulted and she was taken to the OR for exploratory laparotomy, omental patch and hernia repair on 08/05/23.  She was admitted to the ICU.  She is on TNA.    Assessment and Plan:  Perforated gastric ulcer - Treated surgically Gastric ulcer perforation with liver cirrhosis, umbilical hernia with incarcerated omental and recanalized umbilical vein  Postop day #8 - S/p exploratory laparotomy: gastrorrhaphy, omental patch, liver biopsy, primary umbilical hernia repair completed  - NG tube removed. UGI with no leaks seen.  - diet advanced to heart healthy 8/3 - JP drain in place with serous output - discontinued TPN per surgeon 8/3 - Zosyn  and diflucan  completed on 7/31  - Protonix  40 mg BID - IV muscle relaxants as needed  Foley catheter, postop-removed Holding chemical anticoagulation at this time PT eval requested, ambulate to chair, in room, hall   Hypotensive -- RESOLVED  - Postop blood pressure remained low-blood pressure improved - midodrine  discontinued  - Did not require pressors    Decompensated alcoholic cirrhosis -with ascites -LFTs stable -No signs of alcohol withdrawal -Patient does not want her family to know about her diagnosis alcoholic liver cirrhosis -GI consult appreciated    Hyponatremia (Chronic)  --Serum sodium: 126>>  128 >> 125  >>121>125>127>124 -Secondary to liver cirrhosis with chronic alcohol abuse likely related to alcohol intake.   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, and malnutrition --TPN discontinued on 8/3 --diuretics on hold until her sodium improves better per GI team   Hypomagnesemia -- IV replacement ordered and repleted  Acute on chronic anemia -iron deficiency - Acute on chronic anemia with iron deficiency, liver cirrhosis, progressive decline postop hemoglobin -symptomatic with hypotension - Hemoglobin 9.9 stable - s/p 2U PRBC blood transfusion on 08/07/23 - GI has been consulted for liver cirrhosis and ascites appreciate input regarding anemia  DVT prophylaxis: SCDs Code Status: Full  Communication: updated care plan with patient at bedside Disposition:home tomorrow if continues to improve    Consultants:  Surgery Pharmacy   Procedures:  08/05/23 Exp lap, omental patch, liver biopsy, umbilical hernia repair   Subjective: Pt has been eating and drinking well and had a bowel movement.  She is agreeable to ambulating more today.   Objective: Vitals:   08/12/23 1939 08/13/23 0531 08/13/23 0819 08/13/23 1354  BP: 106/70 104/69 115/75 97/62  Pulse: 99 95 95 95  Resp:      Temp: 98.9 F (37.2 C) 98.5 F (36.9 C) 98.8 F (37.1 C) 98.7 F (37.1 C)  TempSrc: Oral Oral Oral Oral  SpO2:  97% 97% 97%  Weight:  56.5 kg    Height:        Intake/Output Summary (Last 24 hours) at 08/13/2023 1542 Last data filed at 08/13/2023 1315 Gross per 24 hour  Intake 730 ml  Output 280 ml  Net 450 ml   Filed Weights   08/11/23 0359 08/12/23 0535 08/13/23  0531  Weight: 55.6 kg 57.3 kg 56.5 kg   Examination:  General exam: Appears calm and comfortable. NAD.    Respiratory system: Clear to auscultation. Respiratory effort normal. Cardiovascular system: normal S1 & S2 heard. No JVD, murmurs, rubs, gallops or clicks. No pedal edema. Gastrointestinal system: JP drain in place, wound c/d/I.   Abdomen is nondistended, soft and minimally tender to my exam. No organomegaly or masses felt. Normal bowel sounds heard. Central nervous system: Alert and oriented. No focal neurological deficits. Extremities: Symmetric 5 x 5 power. Skin: No rashes, lesions or ulcers. Psychiatry: Judgement and insight appear normal. Mood & affect appropriate.   Data Reviewed: I have personally reviewed following labs and imaging studies  CBC: Recent Labs  Lab 08/09/23 0322 08/10/23 0349 08/11/23 0430 08/12/23 0448 08/13/23 0340  WBC 5.5 5.6 5.8 6.9 8.2  HGB 10.3* 10.1* 9.9* 9.5* 9.2*  HCT 30.2* 30.6* 31.2* 29.7* 27.7*  MCV 87.5 88.4 90.2 89.7 88.8  PLT 81* 88* 99* 113* 134*    Basic Metabolic Panel: Recent Labs  Lab 08/08/23 0423 08/09/23 0322 08/10/23 0349 08/11/23 0430 08/12/23 0448 08/13/23 0340  NA 121* 125* 125* 127* 124* 125*  K 3.4* 3.7 3.7 4.3 4.9 4.1  CL 89* 94* 93* 92* 94* 94*  CO2 26 25 26 26 24 23   GLUCOSE 100* 105* 99 98 104* 80  BUN 7 10 11 14 17 13   CREATININE <0.30* 0.36* <0.30* 0.35* 0.31* 0.32*  CALCIUM  7.7* 7.8* 7.9* 8.2* 8.2* 7.9*  MG 1.5* 1.7 1.7 1.9 1.9 1.6*  PHOS 3.4 3.8 3.9 3.9  --  4.6    CBG: Recent Labs  Lab 08/12/23 0737 08/12/23 1159 08/12/23 1636 08/13/23 0719 08/13/23 1130  GLUCAP 83 81 77 83 82    Recent Results (from the past 240 hours)  Aerobic/Anaerobic Culture w Gram Stain (surgical/deep wound)     Status: None   Collection Time: 08/05/23  4:56 AM   Specimen: Path fluid; Body Fluid  Result Value Ref Range Status   Specimen Description   Final    FLUID Performed at [facility], [address], [place], [name] [address]    Special Requests   Final    NONE Performed at [facility], [address] [place], [name] [address]    Gram Stain   Final    RARE WBC SEEN RARE GRAM POSITIVE COCCI RARE GRAM NEGATIVE RODS    Culture   Final    FEW MULTIPLE ORGANISMS PRESENT, NONE PREDOMINANT NO GROUP A STREP (S.PYOGENES) ISOLATED NO  STAPHYLOCOCCUS AUREUS ISOLATED NO ANAEROBES ISOLATED Performed at [facility], [address], [place], [name] [address]    Report Status 08/10/2023 FINAL  Final  MRSA Next Gen by PCR, Nasal     Status: None   Collection Time: 08/05/23  7:53 AM   Specimen: Nasal Mucosa; Nasal Swab  Result Value Ref Range Status   MRSA by PCR Next Gen NOT DETECTED NOT DETECTED Final    Comment: (NOTE) The GeneXpert MRSA Assay (FDA approved for NASAL specimens only), is one component of a comprehensive MRSA colonization surveillance program. It is not intended to diagnose MRSA infection nor to guide or monitor treatment for MRSA infections. Test performance is not FDA approved in patients less than 30 years old. Performed at [facility], [address], [place], [name] [address]      Radiology Studies: CT ABDOMEN PELVIS W CONTRAST Result Date: 08/13/2023 CLINICAL DATA:  Abdominal pain, post-op. EXAM:  CT ABDOMEN AND PELVIS WITH CONTRAST TECHNIQUE: Multidetector CT imaging of the abdomen and pelvis was performed using the standard protocol following bolus administration of intravenous contrast. RADIATION DOSE REDUCTION: This exam was performed according to the departmental dose-optimization program which includes automated exposure control, adjustment of the mA and/or kV according to patient size and/or use of iterative reconstruction technique. CONTRAST:  OMNIPAQUE  IOHEXOL  300 MG/ML  SOLN COMPARISON:  CT scan abdomen and pelvis from 08/05/2023. FINDINGS: Lower chest: There are patchy atelectatic changes in the visualized lung bases. No overt consolidation. No pleural effusion. The heart is normal in size. No pericardial effusion. Hepatobiliary: The liver is normal in size. Non-cirrhotic configuration. There is subtle liver surface irregularity/nodularity. Redemonstration of diffuse heterogeneous hypoattenuating liver attenuation, similar to the prior study. The portal vein and its major tributaries  are patent. There is a stable approximately 9 x 11 mm hypoattenuating lesion in the right hepatic lobe, which is too small to adequately characterize. No intrahepatic or extrahepatic bile duct dilation. No calcified gallstones. Normal gallbladder wall thickness. No pericholecystic inflammatory changes. Pancreas: Unremarkable. No pancreatic ductal dilatation or surrounding inflammatory changes. Spleen: Within normal limits. No focal lesion. Adrenals/Urinary Tract: Adrenal glands are unremarkable. No suspicious renal mass. No hydronephrosis. No renal or ureteric calculi. There is mild irregular thickening of the anterior wall of urinary bladder. However, no perivesical fat stranding. Findings may represent chronic versus acute cystitis. Correlate clinically and with urinalysis. No focal bladder mass or bladder calculi. Stomach/Bowel: No disproportionate dilation of the small or large bowel loops. No evidence of abnormal bowel wall thickening or inflammatory changes. The appendix is unremarkable. Vascular/Lymphatic: Trace amount of ascites between the gallbladder and right hepatic lobe. No pneumoperitoneum. No abdominal or pelvic lymphadenopathy, by size criteria. No aneurysmal dilation of the major abdominal arteries. There are mild peripheral atherosclerotic vascular calcifications of the aorta and its major branches. Reproductive: The uterus is unremarkable. No large adnexal mass. Other: Midline epigastric surgical scar and skin staples noted. There is a JP drain inserted from left paramedian epigastric approach with its tip in the right upper quadrant just inferior to the gallbladder. There is trace amount of fluid between the gallbladder and right hepatic lobe. No walled-off abscess. There is a tiny fat containing right inguinal hernia the soft tissues and abdominal wall are otherwise unremarkable. Musculoskeletal: No suspicious osseous lesions. There are mild multilevel degenerative changes in the visualized  spine. IMPRESSION: 1. There is a JP-drain inserted from left paramedian epigastric approach with its tip in the right upper quadrant just inferior to the gallbladder. There is trace amount of fluid between the gallbladder and right hepatic lobe. No walled-off abscess. 2. There is mild irregular thickening of the anterior wall of urinary bladder. However, no perivesical fat stranding. Findings may represent chronic versus acute cystitis. Correlate clinically and with urinalysis. 3. Multiple other nonacute observations, as described above. Aortic Atherosclerosis (ICD10-I70.0). Electronically Signed   By: Ree Molt M.D.   On: 08/13/2023 13:35    Scheduled Meds:  bisacodyl   10 mg Rectal Daily   Chlorhexidine  Gluconate Cloth  6 each Topical Daily   feeding supplement  1 Container Oral TID BM   folic acid   1 mg Oral Daily   insulin  aspart  0-9 Units Subcutaneous TID WC   pantoprazole   40 mg Oral BID   sertraline   25 mg Oral Daily   sodium chloride  flush  10-40 mL Intracatheter Q12H   sucralfate   1 g Oral TID WC & HS  Continuous Infusions:   LOS: 8 days   Time spent: 55 mins   Marquasia Schmieder Vicci, MD How to contact the [facility] Attending or Consulting provider 7A - 7P or covering provider during after hours 7P -7A, for this patient?  Check the care team in [facility] and look for a) attending/consulting [facility] provider listed and b) the [facility] team listed Log into [email or web address] to find provider on call.  Locate the [facility] provider you are looking for under [facility] and page to a number that you can be directly reached. If you still have difficulty reaching the provider, please page the [facility] (Director on Call) for the Hospitalists listed on amion for assistance.  08/13/2023, 3:42 PM

## 2023-08-13 NOTE — Evaluation (Signed)
 Physical Therapy Evaluation Patient Details Name: Joan Wood MRN: [id number] DOB: 21-Dec-1970 Today's Date: 08/13/2023  History of Present Illness  Joan Wood is a 53 y.o. female with medical history significant of anxiety, GERD, cirrhosis, hypertension who presents emergency department due to abdominal pain.  On arrival she was afebrile and hematin stable labs were obtained on presentation which showed sodium 122, potassium 3.3, creatinine 0.57, WBC 5.5, hemoglobin 10.4, INR 1.3.  Patient CT abdomen pelvis which showed perforated gastric ulcer.  Surgery was consulted and she was taken to the OR for exploratory laparotomy, omental patch and hernia repair.  She was admitted to the ICU and perioperative setting.  Procedure without complication.  Patient was admitted for further workup.   Clinical Impression  Patient demonstrates slow labored movement for sitting up at bedside requiring the use of handrails and educated on log roll technique to minimize abdominal strain and successfully complete thee task. Patient able to self complete transfers but requires increased time secondary to pain. Patient demonstrated good return for ambulating in the room/hallway with no loss of balance using RW. Patient activity tolerance significantly limited at this time due to c/o pain. Patient expressed concern for shower safety- PT recommended 3in1 BSC. Patient will benefit from continued skilled physical therapy in hospital and recommended venue below to increase strength, balance, endurance for safe ADLs and gait.         If plan is discharge home, recommend the following: A little help with walking and/or transfers;Help with stairs or ramp for entrance;Assistance with cooking/housework;A little help with bathing/dressing/bathroom   Can travel by private vehicle        Equipment Recommendations BSC/3in1  Recommendations for Other Services  OT consult    Functional Status Assessment Patient has had a  recent decline in their functional status and demonstrates the ability to make significant improvements in function in a reasonable and predictable amount of time.     Precautions / Restrictions Precautions Precautions: Fall Recall of Precautions/Restrictions: Intact Required Braces or Orthoses: Other Brace Other Brace: Abdominal binder worn PRN Restrictions Weight Bearing Restrictions Per Provider Order: No      Mobility  Bed Mobility Overal bed mobility: Needs Assistance Bed Mobility: Supine to Sit, Sit to Supine     Supine to sit: Supervision Sit to supine: Supervision   General bed mobility comments: slow labored movement, able to self perform using hand rails and log roll method    Transfers Overall transfer level: Needs assistance Equipment used: Rolling walker (2 wheels) Transfers: Sit to/from Stand, Bed to chair/wheelchair/BSC Sit to Stand: Supervision   Step pivot transfers: Supervision       General transfer comment: Requires increased time due to c/o abdominal pain    Ambulation/Gait Ambulation/Gait assistance: Supervision, Contact guard assist Gait Distance (Feet): 75 Feet Assistive device: Rolling walker (2 wheels) Gait Pattern/deviations: Step-to pattern, Decreased step length - right, Decreased step length - left, Decreased stride length, Trunk flexed Gait velocity: decreased     General Gait Details: labored, short steps; good return for ambulation in room/hallway without loss of balance; intermittment cues for upright posture  Stairs            Wheelchair Mobility     Tilt Bed    Modified Rankin (Stroke Patients Only)       Balance Overall balance assessment: Needs assistance   Sitting balance-Leahy Scale: Good Sitting balance - Comments: good seated at EOB     Standing balance-Leahy Scale: Good Standing balance  comment: good using RW                             Pertinent Vitals/Pain Pain Assessment Pain  Assessment: Faces Faces Pain Scale: Hurts worst Pain Location: abdominal pain Pain Descriptors / Indicators: Sharp, Tender, Burning Pain Intervention(s): Limited activity within patient's tolerance, Monitored during session, Patient requesting pain meds-RN notified    Home Living Family/patient expects to be discharged to:: Private residence Living Arrangements: Non-relatives/Friends (Reports having a roommate) Available Help at Discharge: Available PRN/intermittently;Friend(s) Type of Home: House Home Access: Stairs to enter Entrance Stairs-Rails: Right Entrance Stairs-Number of Steps: 5   Home Layout: One level Home Equipment: Cane - Programmer, applications (2 wheels);Wheelchair - manual      Prior Function Prior Level of Function : Needs assist       Physical Assist : ADLs (physical)   ADLs (physical): IADLs Mobility Comments: Independent community ambulator using RW or quad cane depending on foot pain ADLs Comments: Reports independence with most ADLs     Extremity/Trunk Assessment        Lower Extremity Assessment Lower Extremity Assessment: Generalized weakness    Cervical / Trunk Assessment Cervical / Trunk Assessment: Normal  Communication   Communication Communication: No apparent difficulties    Cognition Arousal: Alert Behavior During Therapy: WFL for tasks assessed/performed   PT - Cognitive impairments: No apparent impairments                         Following commands: Intact       Cueing Cueing Techniques: Verbal cues, Tactile cues     General Comments      Exercises     Assessment/Plan    PT Assessment Patient needs continued PT services  PT Problem List Decreased strength;Decreased balance;Decreased mobility;Decreased activity tolerance       PT Treatment Interventions Therapeutic activities;Gait training;Therapeutic exercise;DME instruction;Patient/family education;Stair training;Balance training;Functional mobility  training    PT Goals (Current goals can be found in the Care Plan section)  Acute Rehab PT Goals Patient Stated Goal: return home PT Goal Formulation: With patient Time For Goal Achievement: 08/27/23 Potential to Achieve Goals: Good    Frequency Min 3X/week     Co-evaluation               AM-PAC PT 6 Clicks Mobility  Outcome Measure Help needed turning from your back to your side while in a flat bed without using bedrails?: A Little Help needed moving from lying on your back to sitting on the side of a flat bed without using bedrails?: A Little Help needed moving to and from a bed to a chair (including a wheelchair)?: A Little Help needed standing up from a chair using your arms (e.g., wheelchair or bedside chair)?: A Little Help needed to walk in hospital room?: A Little Help needed climbing 3-5 steps with a railing? : A Lot 6 Click Score: 17    End of Session Equipment Utilized During Treatment: Other (comment) (Abdominal binder) Activity Tolerance: Patient limited by pain Patient left: in bed;with call bell/phone within reach Nurse Communication: Mobility status PT Visit Diagnosis: Unsteadiness on feet (R26.81);Muscle weakness (generalized) (M62.81);Other abnormalities of gait and mobility (R26.89)    Time: 8984-8953 PT Time Calculation (min) (ACUTE ONLY): 31 min   Charges:   PT Evaluation $PT Eval Moderate Complexity: 1 Mod PT Treatments $Therapeutic Activity: 23-37 mins PT General Charges $$ ACUTE PT VISIT:  1 Visit         12:19 PM, 08/13/23,  Ronalda Walpole, SPT

## 2023-08-13 NOTE — Progress Notes (Signed)
 Nurse at bedside,patient was assisted to and from the bathroom,using front wheel walker,patient tolerated ambulation well.Patient c/o abdominal pain rated a 9 sharp,shooting type of pain that was constant. Oxycodone  5 mg's given by mouth for pain,per MAR prn.Plan of care on going.

## 2023-08-13 NOTE — Evaluation (Signed)
 Occupational Therapy Evaluation Patient Details Name: Joan Wood MRN: [id number] DOB: 1970-03-09 Today's Date: 08/13/2023   History of Present Illness   Joan Wood is a 53 y.o. female with medical history significant of anxiety, GERD, cirrhosis, hypertension who presents emergency department due to abdominal pain.  On arrival she was afebrile and hematin stable labs were obtained on presentation which showed sodium 122, potassium 3.3, creatinine 0.57, WBC 5.5, hemoglobin 10.4, INR 1.3.  Patient CT abdomen pelvis which showed perforated gastric ulcer.  Surgery was consulted and she was taken to the OR for exploratory laparotomy, omental patch and hernia repair.  She was admitted to the ICU and perioperative setting.  Procedure without complication.  Patient was admitted for further workup. (per MD)     Clinical Impressions Pt agreeable to OT evaluation. Pt able to ambulate in the room with supervision to mod I level of assist using RW. Pt was washing hands at the sink when this therapist entered the room. Pt able to ambulate without physical assist using RW. B UE WFL strength and functional use. Pt able to doff and don a sock independently. Pt does report history of falls including falling over a rug at home. Pt is interested in a home health safety assessment to reduce falls and increase general safety. Pt is not recommended for further acute OT services and will be discharged to care of nursing staff.                 Functional Status Assessment   Patient has not had a recent decline in their functional status     Equipment Recommendations   None recommended by OT     Recommendations for Other Services         Precautions/Restrictions   Precautions Precautions: Fall Recall of Precautions/Restrictions: Intact Restrictions Weight Bearing Restrictions Per Provider Order: No     Mobility Bed Mobility Overal bed mobility: Modified Independent              General bed mobility comments: Mild labored movement.    Transfers Overall transfer level: Needs assistance Equipment used: Rolling walker (2 wheels) Transfers: Sit to/from Stand Sit to Stand: Modified independent (Device/Increase time), Supervision           General transfer comment: Pre-medicated so not as limited by pain.      Balance Overall balance assessment: Needs assistance Sitting-balance support: No upper extremity supported, Feet supported Sitting balance-Leahy Scale: Good Sitting balance - Comments: good seated at EOB   Standing balance support: Bilateral upper extremity supported, During functional activity Standing balance-Leahy Scale: Good Standing balance comment: good using RW                           ADL either performed or assessed with clinical judgement   ADL Overall ADL's : Modified independent                                       General ADL Comments: Supervision given today but pt really operating at level of MOD I for ambulation around the room with the RW. Pt able to stand at the sink to complete grooming and completed seated doffing and donning of a sock without difficulty. Pt also ambulated to the toilet with the RW and no physical assist.     Vision Baseline Vision/History: 1 Wears glasses Ability  to See in Adequate Light: 1 Impaired Patient Visual Report: No change from baseline Vision Assessment?: No apparent visual deficits     Perception Perception: Not tested       Praxis Praxis: Not tested       Pertinent Vitals/Pain Pain Assessment Pain Assessment: 0-10 Pain Score: 6  Pain Location: abdominal pain Pain Descriptors / Indicators: Discomfort Pain Intervention(s): Monitored during session, Repositioned     Extremity/Trunk Assessment Upper Extremity Assessment Upper Extremity Assessment: Overall WFL for tasks assessed   Lower Extremity Assessment Lower Extremity Assessment: Defer to PT  evaluation   Cervical / Trunk Assessment Cervical / Trunk Assessment: Normal   Communication Communication Communication: No apparent difficulties   Cognition Arousal: Alert Behavior During Therapy: WFL for tasks assessed/performed Cognition: No apparent impairments                               Following commands: Intact       Cueing  General Comments   Cueing Techniques: Verbal cues                 Home Living Family/patient expects to be discharged to:: Private residence Living Arrangements: Non-relatives/Friends Available Help at Discharge: Available PRN/intermittently;Friend(s) Type of Home: House Home Access: Stairs to enter Secretary/administrator of Steps: 5 Entrance Stairs-Rails: Right Home Layout: One level     Bathroom Shower/Tub: Chief Strategy Officer: Standard     Home Equipment: Cane - Programmer, applications (2 wheels);Wheelchair - manual          Prior Functioning/Environment Prior Level of Function : Independent/Modified Independent;History of Falls (last six months) (3 falls in the past 6 months, one included a rug.)             Mobility Comments: Independent community ambulator using RW or quad cane depending on foot pain ADLs Comments: Pt reported being independent for ADL's at home.    OT Problem List: Decreased activity tolerance;Pain    End of Session Equipment Utilized During Treatment: Rolling walker (2 wheels)  Activity Tolerance: Patient tolerated treatment well Patient left: in bed;with call bell/phone within reach  OT Visit Diagnosis: Unsteadiness on feet (R26.81);Other abnormalities of gait and mobility (R26.89);Muscle weakness (generalized) (M62.81);Pain                Time: 1600-1616 OT Time Calculation (min): 16 min Charges:  OT General Charges $OT Visit: 1 Visit OT Evaluation $OT Eval Low Complexity: 1 Low  Evelette Hollern OT, MOT  Jayson Person 08/13/2023, 4:44 PM

## 2023-08-13 NOTE — Progress Notes (Signed)
 Subjective: Reports she is feeling some better. Was able to eat a little yesterday. Having bowel movements. States they are dark green and tarry. This was an issue prior to admission as well. Reports abdominal pain continues. States she can go longer between doses of morphine , about 5-6 hours now. Constant burning pain and intermittent sharp pain like a lightening bolt. Reports abdomen is very tender to the touch on the right side.   Has been getting up with her walker.   Objective: Vital signs in last 24 hours: Temp:  [98.5 F (36.9 C)-99.5 F (37.5 C)] 98.8 F (37.1 C) (08/04 0819) Pulse Rate:  [95-100] 95 (08/04 0819) Resp:  [15] 15 (08/03 1352) BP: (95-115)/(54-75) 115/75 (08/04 0819) SpO2:  [97 %] 97 % (08/04 0819) Weight:  [56.5 kg] 56.5 kg (08/04 0531) Last BM Date : 08/12/23 General:  Alert and oriented, pleasant, NAD.  Head:  Normocephalic and atraumatic. Eyes:  No icterus, sclera clear. Conjuctiva pink.  Abdomen:  Bowel sounds present. Abdomen is full with mild firmness on the right thought difficult to assess as patient not allowing me to palpate her abdomen much due to pain.  Significant tenderness in the right/mid abdomen primarily around incision site and JP drain. Some firmness around the incision, but no clear hernia.  Msk:  Symmetrical without gross deformities. Normal posture. Extremities:  Without edema. Neurologic:  Alert and  oriented x4;  grossly normal neurologically. No asterixis.  Skin:  Warm and dry, intact without significant lesions.  Psych: Normal mood and affect.  Intake/Output from previous day: 08/03 0701 - 08/04 0700 In: 730 [P.O.:720; I.V.:10] Out: 270 [Drains:270] Intake/Output this shift: No intake/output data recorded.  Lab Results: Recent Labs    08/11/23 0430 08/12/23 0448 08/13/23 0340  WBC 5.8 6.9 8.2  HGB 9.9* 9.5* 9.2*  HCT 31.2* 29.7* 27.7*  PLT 99* 113* 134*   BMET Recent Labs    08/11/23 0430 08/12/23 0448  08/13/23 0340  NA 127* 124* 125*  K 4.3 4.9 4.1  CL 92* 94* 94*  CO2 26 24 23   GLUCOSE 98 104* 80  BUN 14 17 13   CREATININE 0.35* 0.31* 0.32*  CALCIUM  8.2* 8.2* 7.9*   LFT Recent Labs    08/11/23 0430 08/12/23 0448 08/13/23 0340  PROT 5.4* 5.4* 5.5*  ALBUMIN 1.8* 1.8* 1.8*  AST 48* 66* 74*  ALT 19 28 34  ALKPHOS 70 83 87  BILITOT 1.0 1.1 1.7*     Assessment: 53 y.o. year old female  with a history of ETOH abuse, cirrhosis felt likely due to ETOH, GERD, HTN, SVT, periumbilical hernia, chronic diarrhea, GI bleed in May 2025 with findings of PUD on EGD, lost to follow-up and surveillance EGD, presenting to the ED 7/26 with abdominal pain, vomiting, and diarrhea, found to have gastric perforation and underwent exploratory laparotomy with gastrorrhaphy, omental patch, wedge liver biopsy, primary umbilical hernia repair, JP drain placement. GI  consulted for increased JP drainage and concern for ascites.    Cirrhosis:  Suspected to be secondary to ETOH.  She has worked on cutting down her daily alcohol use, but continued to drink 3-4 beer daily prior to admission.   Prior serologic evaluation in April with negative viral serologies, elevated IgG, but normal ANA, ASMA, AMA.  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   MELD 3.0 was 24 on 08/09/23.   Concerns for ascites based on increased JP drainage, but no significant ascites noted on  ultrasound. JP drain analysis with SAAG >1.1 suggesting ascites from PHTN.  Encouragingly, JP drainage is improving. Cell count with elevated neutrophils which was suspected to be secondary to inflammatory response, but patient was already being with a course of antibiotics post-op. Had discussed starting diuretics should JP drainage not improve, but this is limited due to ongoing hyponatremia.   Otherwise, from cirrhosis standpoint, she is remaining clinically compensated.   Abdominal pain:   Significant tenderness in right abdomen, primarily around incision site and JP drain. Some firmness appreciated in right abdomen as well as around incision site. Dr.Bridges at bedside as well evaluating patient and has ordered CT A/P for further evaluation.   Acute on chronic anemia: Hemoglobin 8-9 range at baseline, likely secondary to known peptic ulcer disease now with perforation. No previous colonoscopy and cannot rule out colonic etiology. Admission hemoglobin was 10.4 and decreased to 7.1 felt to be multifactorial in setting of recent surgery and perforated ulcer. Received 2 units PRBCs on 7/29 with Hgb improvement to 10.3, but has been slowly trending down, 9.2 this morning. Patient reports stools are dark green and tarry which was also present prior to admission. It is possible that some of the dark stool is washout as she just started moving her bowels in the last couple of days. No brbpr.  BUN wnl.   Will continue to monitor for now. Current plans are for outpatient colonoscopy once recovered from acute illness.   Possible Liver lesion:  Noted on CT in 2022 with repeat CT abdomen pelvis in May noting nodular liver contour as well as rounded hypodensity too small to characterize in the inferior right lobe of the liver which was unchanged in size and no new liver lesions present. Normal AFP in April 2025  Plans for outpatient MRI for further evaluation.   Plan: Agree with CT A/P with contrast as ordered by Dr. Kallie.  Will recheck INR today and add on for daily labs.  Continue to monitor H/H and for overt GI bleeding. Transfuse as necessary.  Continue PPI BID Could consider low dose diuretics if needed pending correction of hyponatremia. Otherwise, can manage ascites with paracentesis if needed.  Strict NSAID avoidance.  Needs to abstain from alcohol.  Outpatient colonoscopy.  Outpatient MRI for liver lesion.  Outpatient cirrhosis care.    LOS: 8 days    08/13/2023, 9:57  AM   Josette Centers, PA-C [facility]

## 2023-08-14 ENCOUNTER — Telehealth: Payer: Self-pay | Admitting: Gastroenterology

## 2023-08-14 DIAGNOSIS — F1090 Alcohol use, unspecified, uncomplicated: Secondary | ICD-10-CM | POA: Diagnosis not present

## 2023-08-14 DIAGNOSIS — K769 Liver disease, unspecified: Secondary | ICD-10-CM

## 2023-08-14 DIAGNOSIS — K668 Other specified disorders of peritoneum: Secondary | ICD-10-CM | POA: Diagnosis not present

## 2023-08-14 DIAGNOSIS — K7031 Alcoholic cirrhosis of liver with ascites: Secondary | ICD-10-CM | POA: Diagnosis not present

## 2023-08-14 DIAGNOSIS — D62 Acute posthemorrhagic anemia: Secondary | ICD-10-CM | POA: Diagnosis not present

## 2023-08-14 DIAGNOSIS — R198 Other specified symptoms and signs involving the digestive system and abdomen: Secondary | ICD-10-CM | POA: Diagnosis not present

## 2023-08-14 DIAGNOSIS — E871 Hypo-osmolality and hyponatremia: Secondary | ICD-10-CM | POA: Diagnosis not present

## 2023-08-14 DIAGNOSIS — K275 Chronic or unspecified peptic ulcer, site unspecified, with perforation: Secondary | ICD-10-CM | POA: Diagnosis not present

## 2023-08-14 DIAGNOSIS — D649 Anemia, unspecified: Secondary | ICD-10-CM | POA: Diagnosis not present

## 2023-08-14 DIAGNOSIS — E440 Moderate protein-calorie malnutrition: Secondary | ICD-10-CM | POA: Diagnosis not present

## 2023-08-14 LAB — CBC
HCT: 27.9 % — ABNORMAL LOW (ref 36.0–46.0)
Hemoglobin: 9 g/dL — ABNORMAL LOW (ref 12.0–15.0)
MCH: 28.6 pg (ref 26.0–34.0)
MCHC: 32.3 g/dL (ref 30.0–36.0)
MCV: 88.6 fL (ref 80.0–100.0)
Platelets: 153 K/uL (ref 150–400)
RBC: 3.15 MIL/uL — ABNORMAL LOW (ref 3.87–5.11)
RDW: 16.5 % — ABNORMAL HIGH (ref 11.5–15.5)
WBC: 7.2 K/uL (ref 4.0–10.5)
nRBC: 0 % (ref 0.0–0.2)

## 2023-08-14 LAB — MAGNESIUM: Magnesium: 1.9 mg/dL (ref 1.7–2.4)

## 2023-08-14 LAB — COMPREHENSIVE METABOLIC PANEL WITH GFR
ALT: 39 U/L (ref 0–44)
AST: 79 U/L — ABNORMAL HIGH (ref 15–41)
Albumin: 1.9 g/dL — ABNORMAL LOW (ref 3.5–5.0)
Alkaline Phosphatase: 92 U/L (ref 38–126)
Anion gap: 10 (ref 5–15)
BUN: 13 mg/dL (ref 6–20)
CO2: 26 mmol/L (ref 22–32)
Calcium: 8 mg/dL — ABNORMAL LOW (ref 8.9–10.3)
Chloride: 92 mmol/L — ABNORMAL LOW (ref 98–111)
Creatinine, Ser: 0.39 mg/dL — ABNORMAL LOW (ref 0.44–1.00)
GFR, Estimated: >60 mL/min (ref >60)
Glucose, Bld: 85 mg/dL (ref 70–99)
Potassium: 3.6 mmol/L (ref 3.5–5.1)
Sodium: 128 mmol/L — ABNORMAL LOW (ref 135–145)
Total Bilirubin: 1.5 mg/dL — ABNORMAL HIGH (ref 0.0–1.2)
Total Protein: 5.8 g/dL — ABNORMAL LOW (ref 6.5–8.1)

## 2023-08-14 LAB — GLUCOSE, CAPILLARY
Glucose-Capillary: 107 mg/dL — ABNORMAL HIGH (ref 70–99)
Glucose-Capillary: 101 mg/dL — ABNORMAL HIGH (ref 70–99)

## 2023-08-14 LAB — PROTIME-INR
INR: 1.5 — ABNORMAL HIGH (ref 0.8–1.2)
Prothrombin Time: 18.4 s — ABNORMAL HIGH (ref 11.4–15.2)

## 2023-08-14 LAB — ALBUMIN, FLUID (OTHER): Albumin, Body Fluid Other: 1 g/dL

## 2023-08-14 MED ORDER — PHENOL 1.4 % MT LIQD: 1.0000 | OROMUCOSAL | 0 refills | Status: DC | PRN

## 2023-08-14 MED ORDER — OMEPRAZOLE 40 MG PO CPDR: 40.0000 mg | DELAYED_RELEASE_CAPSULE | Freq: Two times a day (BID) | ORAL | 1 refills | Status: DC

## 2023-08-14 MED ORDER — SIMETHICONE 80 MG PO CHEW: 80.0000 mg | CHEWABLE_TABLET | Freq: Four times a day (QID) | ORAL | Status: DC | PRN

## 2023-08-14 MED ORDER — MORPHINE SULFATE (PF) 2 MG/ML IV SOLN: 1.0000 mg | INTRAVENOUS | Status: DC | PRN

## 2023-08-14 MED ORDER — OXYCODONE HCL 5 MG PO TABS: 5.0000 mg | ORAL_TABLET | Freq: Four times a day (QID) | ORAL | 0 refills | Status: DC | PRN

## 2023-08-14 MED ORDER — SUCRALFATE 1 G PO TABS
1.0000 g | ORAL_TABLET | Freq: Three times a day (TID) | ORAL | 1 refills | Status: DC
Start: 2023-08-14 — End: 2023-08-22

## 2023-08-14 MED ORDER — BISACODYL 10 MG RE SUPP: 10.0000 mg | Freq: Every day | RECTAL | 0 refills | Status: DC | PRN

## 2023-08-14 MED ORDER — SIMETHICONE 80 MG PO CHEW: 80.0000 mg | CHEWABLE_TABLET | Freq: Four times a day (QID) | ORAL | 0 refills | Status: DC | PRN

## 2023-08-14 MED ORDER — METHOCARBAMOL 500 MG PO TABS: 500.0000 mg | ORAL_TABLET | Freq: Three times a day (TID) | ORAL | 0 refills | Status: DC | PRN

## 2023-08-14 NOTE — Progress Notes (Signed)
 Subjective: Reports she is feeling some  better. Still with abdominal pain, but about the same. Mild nausea, but no vomiting. Tolerating small amounts of food at a time. Had a small bowel movement this morning. Remains dark green.   Denies confusion/trouble thinking.   Objective: Vital signs in last 24 hours: Temp:  [97.5 F (36.4 C)-98.7 F (37.1 C)] 98.1 F (36.7 C) (08/05 0815) Pulse Rate:  [84-96] 84 (08/05 0815) Resp:  [20] 20 (08/05 0407) BP: (91-103)/(52-64) 91/52 (08/05 0815) SpO2:  [95 %-99 %] 97 % (08/05 0815) Weight:  [56 kg] 56 kg (08/05 0359) Last BM Date : 08/12/23 General:   Alert and oriented, pleasant, NAD.  Head:  Normocephalic and atraumatic. Eyes:  No icterus, sclera clear. Conjuctiva pink.  Abdomen:  Bowel sounds present, soft, non-distended. Midline incision with staples, mild erythema right at the staples. JP drain in place with clear yellow drainage. Moderate TTP at incision site and in right upper abdomen.No masses appreciated  Msk:  Symmetrical without gross deformities. Normal posture. Extremities:  Without edema. Neurologic:  Alert and  oriented x4;  grossly normal neurologically. No asterixis.  Skin:  Warm and dry, intact without significant lesions.  Psych:  Normal mood and affect.  Intake/Output from previous day: 08/04 0701 - 08/05 0700 In: 240 [P.O.:240] Out: 300 [Drains:300] Intake/Output this shift: No intake/output data recorded.  Lab Results: Recent Labs    08/12/23 0448 08/13/23 0340  WBC 6.9 8.2  HGB 9.5* 9.2*  HCT 29.7* 27.7*  PLT 113* 134*   BMET Recent Labs    08/12/23 0448 08/13/23 0340 08/14/23 0436  NA 124* 125* 128*  K 4.9 4.1 3.6  CL 94* 94* 92*  CO2 24 23 26   GLUCOSE 104* 80 85  BUN 17 13 13   CREATININE 0.31* 0.32* 0.39*  CALCIUM  8.2* 7.9* 8.0*   LFT Recent Labs    08/12/23 0448 08/13/23 0340 08/14/23 0436  PROT 5.4* 5.5* 5.8*  ALBUMIN 1.8* 1.8* 1.9*  AST 66* 74* 79*  ALT 28 34 39  ALKPHOS 83 87  92  BILITOT 1.1 1.7* 1.5*   PT/INR Recent Labs    08/13/23 1138 08/14/23 0436  LABPROT 17.9* 18.4*  INR 1.4* 1.5*    Studies/Results: CT ABDOMEN PELVIS W CONTRAST Result Date: 08/13/2023 CLINICAL DATA:  Abdominal pain, post-op. EXAM: CT ABDOMEN AND PELVIS WITH CONTRAST TECHNIQUE: Multidetector CT imaging of the abdomen and pelvis was performed using the standard protocol following bolus administration of intravenous contrast. RADIATION DOSE REDUCTION: This exam was performed according to the departmental dose-optimization program which includes automated exposure control, adjustment of the mA and/or kV according to patient size and/or use of iterative reconstruction technique. CONTRAST:  OMNIPAQUE  IOHEXOL  300 MG/ML  SOLN COMPARISON:  CT scan abdomen and pelvis from 08/05/2023. FINDINGS: Lower chest: There are patchy atelectatic changes in the visualized lung bases. No overt consolidation. No pleural effusion. The heart is normal in size. No pericardial effusion. Hepatobiliary: The liver is normal in size. Non-cirrhotic configuration. There is subtle liver surface irregularity/nodularity. Redemonstration of diffuse heterogeneous hypoattenuating liver attenuation, similar to the prior study. The portal vein and its major tributaries are patent. There is a stable approximately 9 x 11 mm hypoattenuating lesion in the right hepatic lobe, which is too small to adequately characterize. No intrahepatic or extrahepatic bile duct dilation. No calcified gallstones. Normal gallbladder wall thickness. No pericholecystic inflammatory changes. Pancreas: Unremarkable. No pancreatic ductal dilatation or surrounding inflammatory changes. Spleen: Within normal limits.  No focal lesion. Adrenals/Urinary Tract: Adrenal glands are unremarkable. No suspicious renal mass. No hydronephrosis. No renal or ureteric calculi. There is mild irregular thickening of the anterior wall of urinary bladder. However, no perivesical fat  stranding. Findings may represent chronic versus acute cystitis. Correlate clinically and with urinalysis. No focal bladder mass or bladder calculi. Stomach/Bowel: No disproportionate dilation of the small or large bowel loops. No evidence of abnormal bowel wall thickening or inflammatory changes. The appendix is unremarkable. Vascular/Lymphatic: Trace amount of ascites between the gallbladder and right hepatic lobe. No pneumoperitoneum. No abdominal or pelvic lymphadenopathy, by size criteria. No aneurysmal dilation of the major abdominal arteries. There are mild peripheral atherosclerotic vascular calcifications of the aorta and its major branches. Reproductive: The uterus is unremarkable. No large adnexal mass. Other: Midline epigastric surgical scar and skin staples noted. There is a JP drain inserted from left paramedian epigastric approach with its tip in the right upper quadrant just inferior to the gallbladder. There is trace amount of fluid between the gallbladder and right hepatic lobe. No walled-off abscess. There is a tiny fat containing right inguinal hernia the soft tissues and abdominal wall are otherwise unremarkable. Musculoskeletal: No suspicious osseous lesions. There are mild multilevel degenerative changes in the visualized spine. IMPRESSION: 1. There is a JP-drain inserted from left paramedian epigastric approach with its tip in the right upper quadrant just inferior to the gallbladder. There is trace amount of fluid between the gallbladder and right hepatic lobe. No walled-off abscess. 2. There is mild irregular thickening of the anterior wall of urinary bladder. However, no perivesical fat stranding. Findings may represent chronic versus acute cystitis. Correlate clinically and with urinalysis. 3. Multiple other nonacute observations, as described above. Aortic Atherosclerosis (ICD10-I70.0). Electronically Signed   By: Ree Molt M.D.   On: 08/13/2023 13:35    Assessment: 53 y.o. year  old female  with a history of ETOH abuse, cirrhosis felt likely due to ETOH, GERD, HTN, SVT, periumbilical hernia, chronic diarrhea, GI bleed in May 2025 with findings of PUD on EGD, lost to follow-up and surveillance EGD, presenting to the ED 7/26 with abdominal pain, vomiting, and diarrhea, found to have gastric perforation and underwent exploratory laparotomy with gastrorrhaphy, omental patch, wedge liver biopsy, primary umbilical hernia repair, JP drain placement. GI  consulted for increased JP drainage and concern for ascites.    Cirrhosis:  Suspected to be secondary to ETOH.  She has worked on cutting down her daily alcohol use, but continued to drink 3-4 beer daily prior to admission.   Prior serologic evaluation in April with negative viral serologies, elevated IgG, but normal ANA, ASMA, AMA.  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    MELD 3.0 22 today.  Concerns for ascites based on increased JP drainage, but no significant ascites noted on ultrasound. JP drain analysis with SAAG >1.1 suggesting ascites from PHTN.  Encouragingly, JP drainage is improving. Max output was 770 on 7/27, total of 300 yesterday. We had discussed starting diuretics should JP drainage not improve, but this is limited due to ongoing hyponatremia.   Notably, JP drain analysis with cell count showing elevated neutrophils which was suspected to be secondary to inflammatory response, but patient was already being treated with a course of antibiotics post-op.    Otherwise, from cirrhosis standpoint, she is remaining clinically compensated.   Abdominal pain:  Post-op pain. CT yesterday with no acute finding.   Acute on chronic anemia: Hemoglobin 8-9  range at baseline, likely secondary to known peptic ulcer disease now with perforation. No previous colonoscopy so cannot rule out colonic etiology. Admission hemoglobin was 10.4 and decreased to 7.1 felt to be  multifactorial in setting of recent surgery and perforated ulcer. Received 2 units PRBCs on 7/29 with Hgb improvement to 10.3, but has been slowly trending down, 9.2 yesterday. Reports stools are dark green and tarry which was also present prior to admission. No brbpr. BUN wnl.   Will continue to monitor for now. With recent surgery, no plans for EGD or colonoscopy this admission. Planning for colonoscopy in the outpatient setting. Ideally would like to wait close to 6 weeks post op if possible.   Possible Liver lesion:  Noted on CT in 2022 with repeat CT abdomen pelvis in May noting nodular liver contour as well as rounded hypodensity too small to characterize in the inferior right lobe of the liver which was unchanged in size and no new liver lesions present. Normal AFP in April 2025   Plans for outpatient MRI for further evaluation.  Plan: CBC today.   Patient planning to discharge home later today.  Will need ongoing monitoring of H/H as well as her electrolytes after discharge considering fluctuating hemoglobin and hyponatremia.  Continue PPI BID and Carafate  QID.  Strict NSAID avoidance. Discussed with patient again today.  May take tylenol  as needed, but no more than 2g per day.  Strict alcohol abstinence. Discussed with patient again today.  Outpatient colonoscopy preferably not before 6 weeks post op, 4 weeks at the earliest.  Outpatient MRI for liver lesion.  Outpatient cirrhosis care.  Will arrange follow-up in our office in 2 weeks. We can arrange CBC and BMP in 1 week.    LOS: 9 days    08/14/2023, 9:36 AM   Josette Centers, PA-C [facility]

## 2023-08-14 NOTE — TOC Transition Note (Signed)
 Transition of Care [facility]) - Discharge Note   Patient Details  Name: Joan Wood MRN: [id number] Date of Birth: [birth year]  Transition of Care [facility]) CM/SW Contact:  Sharlyne Stabs, RN Phone Number: 08/14/2023, 11:11 AM   Clinical Narrative:   Patient discharging home. TOC unable to get an accepting home health agency. Patient walked 75Feet, TOC offered Out patient PT, patient does not have transportation. TOC advised to follow up with PCP and asking for Home health. Maybe they will have an accepting agency.    Final next level of care: Home/Self Care Barriers to Discharge: No Home Care Agency will accept this patient   Patient Goals and CMS Choice Patient states their goals for this hospitalization and ongoing recovery are:: Return home,   Discharge Placement    Patient and family notified of of transfer: 08/14/23  Discharge Plan and Services Additional resources added to the After Visit Summary for    Social Drivers of Health (SDOH) Interventions SDOH Screenings   Food Insecurity: No Food Insecurity (08/05/2023)  Housing: Low Risk  (08/05/2023)  Transportation Needs: No Transportation Needs (08/05/2023)  Utilities: Not At Risk (08/05/2023)  Depression (PHQ2-9): High Risk (07/30/2023)  Tobacco Use: High Risk (08/04/2023)     Readmission Risk Interventions    08/11/2023    9:36 AM 08/10/2023   10:02 AM 08/05/2023    4:14 PM  Readmission Risk Prevention Plan  Transportation Screening Complete Complete Complete  PCP or Specialist Appt within 5-7 Days   Complete  Home Care Screening   Complete  Medication Review (RN CM)   Complete  HRI or Home Care Consult Complete Complete   Social Work Consult for Recovery Care Planning/Counseling Complete Complete   Palliative Care Screening Not Applicable Not Applicable   Medication Review [employer]) Complete Complete

## 2023-08-14 NOTE — Progress Notes (Signed)
 PICC to right upper arm removed with length of 34 cm.   No bleeding at site, gauze and tegaderm applied.

## 2023-08-14 NOTE — Telephone Encounter (Signed)
 Jhonny, please arrange hospital follow-up in 2 weeks. Patient can see Dr. Cindie or any APP. She has been following with Charmaine in the outpatient setting and saw all of us  except Sonny while inpatient.   Charmaine, can you arrange CBC in 1 week?

## 2023-08-14 NOTE — Discharge Summary (Addendum)
 Physician Discharge Summary  MIDA CORY FMW:[id number] DOB: [birth year] DOA: 08/04/2023  PCP: Zarwolo, Gloria, FNP Surgeon: Dr. Kallie GI: [facility]  Admit date: 08/04/2023 Discharge date: 08/14/2023  Admitted From:  Home  Disposition: Home   Recommendations for Outpatient Follow-up:  Follow up with PCP in 1 weeks Follow up with [facility] in 2 weeks  Follow up with Dr. Kallie on 08/22/23 for staple and JP removal AVOID ALL NSAID MEDICATIONS  Meloxicam  has been permanently discontinued   Home Health: per [facility] - see notes  Discharge Condition: STABLE   CODE STATUS: FULL DIET: Heart healthy, advance as tolerated, take carafate  tablet with meals   Brief Hospitalization Summary: Please see all hospital notes, images, labs for full details of the hospitalization. ADMISSION PROVIDER HPI:  DESIA SABAN is a 53 y.o. female with medical history significant of anxiety, GERD, cirrhosis, hypertension who presents emergency department due to abdominal pain.  On arrival she was afebrile and hemodynamically stable, labs showed sodium 122, potassium 3.3, creatinine 0.57, WBC 5.5, hemoglobin 10.4, INR 1.3.  Patient CT abdomen pelvis which showed perforated gastric ulcer.  Surgery was consulted and she was taken to the OR for exploratory laparotomy, omental patch and hernia repair on 08/05/23.  She was admitted to the ICU.  She was on TNA.   HOSPITAL COURSE BY LISTED PROBLEMS ADDRESSED   Perforated gastric ulcer - Treated surgically Gastric ulcer perforation with liver cirrhosis, umbilical hernia with incarcerated omental and recanalized umbilical vein  Postop day #9 - S/p exploratory laparotomy: gastrorrhaphy, omental patch, liver biopsy, primary umbilical hernia repair completed  - NG tube removed. UGI with no leaks seen.  - diet advanced to heart healthy 8/3 - JP drain in place with serous output - discontinued TPN per surgeon 8/3 - Zosyn  and diflucan  completed on 7/31  - Protonix  40  mg BID-- RESUME home omeprazole  BID at DC  - muscle relaxants as needed - pt tolerating solid diet and having BMs - stable to discharge home today; oral roxicodone  for pain control, DC PICC line - JP care instructions given to patient by surgeon;  - Pt to follow up with Dr Kallie on 08/22/23 for JP and staple removal - Pt strongly advised to AVOID ALL NSAIDS going forward   Foley catheter, postop-removed PT eval requested, ambulate to chair, in room, hall TOC trying to arrange for home health if possible given insurance   Hypotensive -- RESOLVED  - Postop blood pressure remained low-blood pressure improved - midodrine  discontinued  - Did not require pressors    Decompensated alcoholic cirrhosis -with ascites -LFTs stable -No signs of alcohol withdrawal -Patient does not want her family to know about her diagnosis alcoholic liver cirrhosis -GI consult appreciated and [facility] to arrange follow up    Hyponatremia (Chronic)  --Serum sodium: 126>>  128 >> 125 >>121>125>127>124>128 -Secondary to liver cirrhosis with chronic alcohol abuse likely related to alcohol intake.   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, and malnutrition --TPN discontinued on 8/3 --diuretics on hold until her sodium improves better per GI team   Hypomagnesemia -- IV replacement ordered and repleted   Acute on chronic anemia -iron deficiency - Acute on chronic anemia with iron deficiency, liver cirrhosis, progressive decline postop hemoglobin -symptomatic with hypotension - Hemoglobin 9.9 stable - s/p 2U PRBC blood transfusion on 08/07/23 - GI has been consulted for liver cirrhosis and ascites appreciate input regarding anemia    Discharge Diagnoses:  Principal Problem:   Perforated  abdominal viscus Active Problems:   Incarcerated umbilical hernia   Perforated ulcer (HCC)   Alcoholic cirrhosis (HCC)   Pneumoperitoneum   Malnutrition of moderate degree   ABLA (acute blood loss  anemia)   Discharge Instructions:  Allergies as of 08/14/2023       Reactions   Bee Venom Anaphylaxis   Cannot take epi for this allergy d/t heart condition per pt   Fire Ant Hives, Rash   Latex Rash        Medication List     STOP taking these medications    meloxicam  15 MG tablet Commonly known as: MOBIC    traMADol  50 MG tablet Commonly known as: ULTRAM        TAKE these medications    bisacodyl  10 MG suppository Commonly known as: DULCOLAX Place 1 suppository (10 mg total) rectally daily as needed for moderate constipation.   Fitrite Back Brace with Pulley Misc 1 Units by Does not apply route daily.   folic acid  1 MG tablet Commonly known as: FOLVITE  Take 1 tablet (1 mg total) by mouth daily.   methocarbamol  500 MG tablet Commonly known as: ROBAXIN  Take 1 tablet (500 mg total) by mouth every 8 (eight) hours as needed for muscle spasms.   Misc. Devices Misc Closed toe walking boot for right foot toe fractures Dx: S92.301   omeprazole  40 MG capsule Commonly known as: PRILOSEC Take 1 capsule (40 mg total) by mouth 2 (two) times daily.   oxyCODONE  5 MG immediate release tablet Commonly known as: Oxy IR/ROXICODONE  Take 1 tablet (5 mg total) by mouth every 6 (six) hours as needed for severe pain (pain score 7-10) or breakthrough pain.   phenol 1.4 % Liqd Commonly known as: CHLORASEPTIC Use as directed 1 spray in the mouth or throat as needed for throat irritation / pain.   sertraline  25 MG tablet Commonly known as: ZOLOFT  Take 1 tablet (25 mg total) by mouth daily.   simethicone  80 MG chewable tablet Commonly known as: MYLICON Chew 1 tablet (80 mg total) by mouth 4 (four) times daily as needed for flatulence.   sucralfate  1 g tablet Commonly known as: CARAFATE  Take 1 tablet (1 g total) by mouth 4 (four) times daily -  with meals and at bedtime.   Vitamin D  (Ergocalciferol ) 1.25 MG (50000 UNIT) Caps capsule Commonly known as: DRISDOL  Take 1 capsule  (50,000 Units total) by mouth every 7 (seven) days.        Follow-up Information     Kallie Manuelita BROCKS, MD Follow up on 08/22/2023.   Specialty: General Surgery Why: staple removal, drain removal Contact information: [address] [place] [facility] [address] [phone]         Zarwolo, Gloria, FNP. Schedule an appointment as soon as possible for a visit in 1 week(s).   Specialty: Family Medicine Why: Hospital Follow Up Contact information: [address] [name] [address] [phone]                Allergies  Allergen Reactions   Bee Venom Anaphylaxis    Cannot take epi for this allergy d/t heart condition per pt   Fire Ant Hives and Rash   Latex Rash   Allergies as of 08/14/2023       Reactions   Bee Venom Anaphylaxis   Cannot take epi for this allergy d/t heart condition per pt   Fire Ant Hives, Rash   Latex Rash        Medication  List     STOP taking these medications    meloxicam  15 MG tablet Commonly known as: MOBIC    traMADol  50 MG tablet Commonly known as: ULTRAM        TAKE these medications    bisacodyl  10 MG suppository Commonly known as: DULCOLAX Place 1 suppository (10 mg total) rectally daily as needed for moderate constipation.   Fitrite Back Brace with Pulley Misc 1 Units by Does not apply route daily.   folic acid  1 MG tablet Commonly known as: FOLVITE  Take 1 tablet (1 mg total) by mouth daily.   methocarbamol  500 MG tablet Commonly known as: ROBAXIN  Take 1 tablet (500 mg total) by mouth every 8 (eight) hours as needed for muscle spasms.   Misc. Devices Misc Closed toe walking boot for right foot toe fractures Dx: S92.301   omeprazole  40 MG capsule Commonly known as: PRILOSEC Take 1 capsule (40 mg total) by mouth 2 (two) times daily.   oxyCODONE  5 MG immediate release tablet Commonly known as: Oxy IR/ROXICODONE  Take 1 tablet (5 mg total) by mouth every 6 (six) hours as needed for severe pain (pain score  7-10) or breakthrough pain.   phenol 1.4 % Liqd Commonly known as: CHLORASEPTIC Use as directed 1 spray in the mouth or throat as needed for throat irritation / pain.   sertraline  25 MG tablet Commonly known as: ZOLOFT  Take 1 tablet (25 mg total) by mouth daily.   simethicone  80 MG chewable tablet Commonly known as: MYLICON Chew 1 tablet (80 mg total) by mouth 4 (four) times daily as needed for flatulence.   sucralfate  1 g tablet Commonly known as: CARAFATE  Take 1 tablet (1 g total) by mouth 4 (four) times daily -  with meals and at bedtime.   Vitamin D  (Ergocalciferol ) 1.25 MG (50000 UNIT) Caps capsule Commonly known as: DRISDOL  Take 1 capsule (50,000 Units total) by mouth every 7 (seven) days.        Procedures/Studies: CT ABDOMEN PELVIS W CONTRAST Result Date: 08/13/2023 CLINICAL DATA:  Abdominal pain, post-op. EXAM: CT ABDOMEN AND PELVIS WITH CONTRAST TECHNIQUE: Multidetector CT imaging of the abdomen and pelvis was performed using the standard protocol following bolus administration of intravenous contrast. RADIATION DOSE REDUCTION: This exam was performed according to the departmental dose-optimization program which includes automated exposure control, adjustment of the mA and/or kV according to patient size and/or use of iterative reconstruction technique. CONTRAST:  OMNIPAQUE  IOHEXOL  300 MG/ML  SOLN COMPARISON:  CT scan abdomen and pelvis from 08/05/2023. FINDINGS: Lower chest: There are patchy atelectatic changes in the visualized lung bases. No overt consolidation. No pleural effusion. The heart is normal in size. No pericardial effusion. Hepatobiliary: The liver is normal in size. Non-cirrhotic configuration. There is subtle liver surface irregularity/nodularity. Redemonstration of diffuse heterogeneous hypoattenuating liver attenuation, similar to the prior study. The portal vein and its major tributaries are patent. There is a stable approximately 9 x 11 mm  hypoattenuating lesion in the right hepatic lobe, which is too small to adequately characterize. No intrahepatic or extrahepatic bile duct dilation. No calcified gallstones. Normal gallbladder wall thickness. No pericholecystic inflammatory changes. Pancreas: Unremarkable. No pancreatic ductal dilatation or surrounding inflammatory changes. Spleen: Within normal limits. No focal lesion. Adrenals/Urinary Tract: Adrenal glands are unremarkable. No suspicious renal mass. No hydronephrosis. No renal or ureteric calculi. There is mild irregular thickening of the anterior wall of urinary bladder. However, no perivesical fat stranding. Findings may represent chronic versus acute cystitis. Correlate clinically and with  urinalysis. No focal bladder mass or bladder calculi. Stomach/Bowel: No disproportionate dilation of the small or large bowel loops. No evidence of abnormal bowel wall thickening or inflammatory changes. The appendix is unremarkable. Vascular/Lymphatic: Trace amount of ascites between the gallbladder and right hepatic lobe. No pneumoperitoneum. No abdominal or pelvic lymphadenopathy, by size criteria. No aneurysmal dilation of the major abdominal arteries. There are mild peripheral atherosclerotic vascular calcifications of the aorta and its major branches. Reproductive: The uterus is unremarkable. No large adnexal mass. Other: Midline epigastric surgical scar and skin staples noted. There is a JP drain inserted from left paramedian epigastric approach with its tip in the right upper quadrant just inferior to the gallbladder. There is trace amount of fluid between the gallbladder and right hepatic lobe. No walled-off abscess. There is a tiny fat containing right inguinal hernia the soft tissues and abdominal wall are otherwise unremarkable. Musculoskeletal: No suspicious osseous lesions. There are mild multilevel degenerative changes in the visualized spine. IMPRESSION: 1. There is a JP-drain inserted from  left paramedian epigastric approach with its tip in the right upper quadrant just inferior to the gallbladder. There is trace amount of fluid between the gallbladder and right hepatic lobe. No walled-off abscess. 2. There is mild irregular thickening of the anterior wall of urinary bladder. However, no perivesical fat stranding. Findings may represent chronic versus acute cystitis. Correlate clinically and with urinalysis. 3. Multiple other nonacute observations, as described above. Aortic Atherosclerosis (ICD10-I70.0). Electronically Signed   By: Ree Molt M.D.   On: 08/13/2023 13:35   DG UGI W SINGLE CM (SOL OR THIN BA) Result Date: 08/10/2023 CLINICAL DATA:  Status post surgical repair of perforated gastric ulcer. Request for limited upper GI to evaluate for leak. EXAM: DG UGI W SINGLE CM TECHNIQUE: Single contrast examination was then performed using water-soluble contrast. Contrast injected down patient's existing nasogastric tube into the stomach. This exam was performed by Franky Rusk PA-C , and was supervised and interpreted by Dr. Ester Sides. FLUOROSCOPY: Radiation Exposure Index (as provided by the fluoroscopic device): 44.50 mGy Kerma COMPARISON:  None Available. FINDINGS: Midline surgical staples consistent with open abdominal surgery. Nasogastric tube with tip terminating in the body of the stomach. Surgical drain noted overlying the expected surgical site. Contrast fills the stomach without difficulty. Images taken in AP, various obliquities, and right lateral decubitus positions. No definitive evidence of contrast extravasation from the expected surgical site. Normal filling of the pylorus with prompt gastric emptying. Normal caliber duodenum with active peristalsis. IMPRESSION: No definitive contrast extravasation or leak from expected gastric perforation surgical repair site. Electronically Signed   By: Ester Sides M.D.   On: 08/10/2023 11:37   DG Abd 1 View Result Date:  08/07/2023 CLINICAL DATA:  Nasogastric tube EXAM: ABDOMEN - 1 VIEW COMPARISON:  Abdominal x-ray 08/05/2023 FINDINGS: Nasogastric tube tip at the level of the gastric antrum. Surgical drain and skin staples are again seen in the mid abdomen. Right upper extremity PICC terminates over the distal SVC. The lungs appear clear. There is no pneumothorax. The cardiomediastinal silhouette is within normal limits. IMPRESSION: Nasogastric tube tip at the level of the gastric antrum. Electronically Signed   By: Greig Pique M.D.   On: 08/07/2023 22:29   US  Abdomen Limited Result Date: 08/06/2023 CLINICAL DATA:  Evaluate for ascites. EXAM: LIMITED ABDOMEN ULTRASOUND FOR ASCITES TECHNIQUE: Limited ultrasound survey for ascites was performed in all four abdominal quadrants. COMPARISON:  CT abdomen August 05, 2023 FINDINGS: No free fluid  within the limited images provided in 4 quadrants. IMPRESSION: No ascites identified on current exam and may be obscured by bowel gas (on recent CT from 08/05/2023 there is ascites in perihepatic, perisplenic and pelvic cavity). Electronically Signed   By: Megan  Zare M.D.   On: 08/06/2023 16:53   US  EKG SITE RITE Result Date: 08/06/2023 If Site Rite image not attached, placement could not be confirmed due to current cardiac rhythm.  DG Abd 1 View Result Date: 08/05/2023 CLINICAL DATA:  NG tube placement. EXAM: ABDOMEN - 1 VIEW COMPARISON:  None Available. FINDINGS: NG tube tip is in the region of the gastric fundus. Side port of the NG tube is well below the GE junction. Mild gaseous distention of the stomach evident. Surgical drain overlies the midline upper abdomen. IMPRESSION: NG tube tip is in the region of the gastric fundus. Electronically Signed   By: Camellia Candle M.D.   On: 08/05/2023 06:43   CT ABDOMEN PELVIS W CONTRAST Result Date: 08/05/2023 CLINICAL DATA:  Acute abdominal pain for 2 days, initial encounter EXAM: CT ABDOMEN AND PELVIS WITH CONTRAST TECHNIQUE: Multidetector  CT imaging of the abdomen and pelvis was performed using the standard protocol following bolus administration of intravenous contrast. RADIATION DOSE REDUCTION: This exam was performed according to the departmental dose-optimization program which includes automated exposure control, adjustment of the mA and/or kV according to patient size and/or use of iterative reconstruction technique. CONTRAST:  OMNIPAQUE  IOHEXOL  300 MG/ML  SOLN COMPARISON:  05/27/2023 FINDINGS: Lower chest: Lung bases are free of acute infiltrate or sizable effusion. Hepatobiliary: Diffuse decreased attenuation is noted throughout the liver likely related to underlying cirrhotic change. Mild nodularity is seen. Recanalization of the umbilical vein is noted which subsequently passes through a umbilical hernia and than decompresses via collaterals into the left external iliac vein. Gallbladder is well distended without cholelithiasis. Some mild pericholecystic fluid is noted related to the known perforation. Pancreas: Unremarkable. No pancreatic ductal dilatation or surrounding inflammatory changes. Spleen: Normal in size without focal abnormality. Adrenals/Urinary Tract: Adrenal glands are within normal limits. Kidneys show no renal calculi or obstructive changes. The bladder is well distended. Stomach/Bowel: No obstructive or inflammatory changes of the colon are seen. The appendix again demonstrates appendicoliths distally although no significant changes to suggest acute appendicitis are noted. Small bowel is within normal limits. There remains thickening of the gastric wall similar to that seen on the prior exam. The outpouching of air suspicious for ulceration on the prior exam now shows free communication through the anterior gastric wall along the posterior aspect of the left lobe of the liver consistent with perforated ulcer. Considerable free air is noted. Free fluid is noted as well related to these changes. Small hiatal hernia is  noted. Vascular/Lymphatic: Aortic atherosclerosis. No enlarged abdominal or pelvic lymph nodes. Reproductive: Uterus and bilateral adnexa are unremarkable. Other: Free fluid and free air is noted related to the gastric ulcer perforation. Fat containing umbilical hernia is noted. 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 These changes are stable from the prior exam. Musculoskeletal: Old left rib fractures are noted posteriorly. No acute bony abnormality is noted. Anterolisthesis of L4 on L5 is again noted. IMPRESSION: Changes consistent with perforated anterior gastric ulcer progressed in the interval from the prior exam. Considerable free air and free fluid is noted related to the perforation. 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 Prominent appendix with single appendicolith within. This  is stable in appearance from the prior exam without inflammatory change. Critical Value/emergent results were called by telephone at the time of interpretation on 08/05/2023 at 1:06 am to Dr. DONALD WICKLINE , who verbally acknowledged these results. Electronically Signed   By: Oneil Devonshire M.D.   On: 08/05/2023 01:12   DG Chest Portable 1 View Result Date: 08/04/2023 CLINICAL DATA:  Dyspnea EXAM: PORTABLE CHEST 1 VIEW COMPARISON:  CT 05/12/2023 FINDINGS: The heart size and mediastinal contours are within normal limits. Both lungs are clear. The visualized skeletal structures are unremarkable. IMPRESSION: No active disease. Electronically Signed   By: Dorethia Molt M.D.   On: 08/04/2023 23:50     Subjective: Pt tolerating solid foods diet and having bowel movements, she is ambulating in room.  Pain is much better and controlled with oral pain meds.   Discharge Exam: Vitals:   08/14/23 0407 08/14/23 0815  BP: 99/64 (!) 91/52  Pulse: 84 84  Resp: 20   Temp: 98.5 F (36.9 C) 98.1 F (36.7 C)  SpO2: 95% 97%   Vitals:   08/13/23 2332 08/14/23  0359 08/14/23 0407 08/14/23 0815  BP: 98/60  99/64 (!) 91/52  Pulse: 96  84 84  Resp:   20   Temp:   98.5 F (36.9 C) 98.1 F (36.7 C)  TempSrc:   Oral Oral  SpO2: 99%  95% 97%  Weight:  56 kg    Height:       General: Pt is alert, awake, not in acute distress Cardiovascular: normal S1/S2 +, no rubs, no gallops Respiratory: CTA bilaterally, no wheezing, no rhonchi Abdominal: Soft, less tenderness, ND, bowel sounds + Extremities: no edema, no cyanosis   The results of significant diagnostics from this hospitalization (including imaging, microbiology, ancillary and laboratory) are listed below for reference.     Microbiology: Recent Results (from the past 240 hours)  Aerobic/Anaerobic Culture w Gram Stain (surgical/deep wound)     Status: None   Collection Time: 08/05/23  4:56 AM   Specimen: Path fluid; Body Fluid  Result Value Ref Range Status   Specimen Description   Final    FLUID Performed at [facility], [address] [place], [name] [address]    Special Requests   Final    NONE Performed at [facility], [address], [place], [name] [address]    Gram Stain   Final    RARE WBC SEEN RARE GRAM POSITIVE COCCI RARE GRAM NEGATIVE RODS    Culture   Final    FEW MULTIPLE ORGANISMS PRESENT, NONE PREDOMINANT NO GROUP A STREP (S.PYOGENES) ISOLATED NO STAPHYLOCOCCUS AUREUS ISOLATED NO ANAEROBES ISOLATED Performed at [facility], [address], [place], [name] [address]    Report Status 08/10/2023 FINAL  Final  MRSA Next Gen by PCR, Nasal     Status: None   Collection Time: 08/05/23  7:53 AM   Specimen: Nasal Mucosa; Nasal Swab  Result Value Ref Range Status   MRSA by PCR Next Gen NOT DETECTED NOT DETECTED Final    Comment: (NOTE) The GeneXpert MRSA Assay (FDA approved for NASAL specimens only), is one component of a comprehensive MRSA colonization surveillance program. It is not intended to diagnose MRSA infection nor to guide or monitor treatment for  MRSA infections. Test performance is not FDA approved in patients less than 72 years old. Performed at [facility], [address], [place], [name] [address]      Labs: BNP (last 3 results)  No results for input(s): BNP in the last 8760 hours. Basic Metabolic Panel: Recent Labs  Lab 08/08/23 0423 08/09/23 0322 08/10/23 0349 08/11/23 0430 08/12/23 0448 08/13/23 0340 08/14/23 0436  NA 121* 125* 125* 127* 124* 125* 128*  K 3.4* 3.7 3.7 4.3 4.9 4.1 3.6  CL 89* 94* 93* 92* 94* 94* 92*  CO2 26 25 26 26 24 23 26   GLUCOSE 100* 105* 99 98 104* 80 85  BUN 7 10 11 14 17 13 13   CREATININE <0.30* 0.36* <0.30* 0.35* 0.31* 0.32* 0.39*  CALCIUM  7.7* 7.8* 7.9* 8.2* 8.2* 7.9* 8.0*  MG 1.5* 1.7 1.7 1.9 1.9 1.6* 1.9  PHOS 3.4 3.8 3.9 3.9  --  4.6  --    Liver Function Tests: Recent Labs  Lab 08/10/23 0349 08/11/23 0430 08/12/23 0448 08/13/23 0340 08/14/23 0436  AST 33 48* 66* 74* 79*  ALT 14 19 28  34 39  ALKPHOS 53 70 83 87 92  BILITOT 1.2 1.0 1.1 1.7* 1.5*  PROT 5.7* 5.4* 5.4* 5.5* 5.8*  ALBUMIN 1.9* 1.8* 1.8* 1.8* 1.9*   Recent Labs  Lab 08/09/23 1806  AMYLASE 123*   No results for input(s): AMMONIA in the last 168 hours. CBC: Recent Labs  Lab 08/09/23 0322 08/10/23 0349 08/11/23 0430 08/12/23 0448 08/13/23 0340  WBC 5.5 5.6 5.8 6.9 8.2  HGB 10.3* 10.1* 9.9* 9.5* 9.2*  HCT 30.2* 30.6* 31.2* 29.7* 27.7*  MCV 87.5 88.4 90.2 89.7 88.8  PLT 81* 88* 99* 113* 134*   Cardiac Enzymes: No results for input(s): CKTOTAL, CKMB, CKMBINDEX, TROPONINI in the last 168 hours. BNP: Invalid input(s): POCBNP CBG: Recent Labs  Lab 08/12/23 1636 08/13/23 0719 08/13/23 1130 08/13/23 1708 08/14/23 0706  GLUCAP 77 83 82 83 107*   D-Dimer No results for input(s): DDIMER in the last 72 hours. Hgb A1c No results for input(s): HGBA1C in the last 72 hours. Lipid Profile No results for input(s): CHOL, HDL, LDLCALC, TRIG, CHOLHDL, LDLDIRECT in the last  72 hours. Thyroid  function studies No results for input(s): TSH, T4TOTAL, T3FREE, THYROIDAB in the last 72 hours.  Invalid input(s): FREET3 Anemia work up No results for input(s): VITAMINB12, FOLATE, FERRITIN, TIBC, IRON, RETICCTPCT in the last 72 hours. Urinalysis    Component Value Date/Time   COLORURINE YELLOW 08/13/2023 1540   APPEARANCEUR CLEAR 08/13/2023 1540   LABSPEC 1.036 (H) 08/13/2023 1540   PHURINE 5.0 08/13/2023 1540   GLUCOSEU NEGATIVE 08/13/2023 1540   HGBUR SMALL (A) 08/13/2023 1540   BILIRUBINUR NEGATIVE 08/13/2023 1540   KETONESUR NEGATIVE 08/13/2023 1540   PROTEINUR NEGATIVE 08/13/2023 1540   NITRITE NEGATIVE 08/13/2023 1540   LEUKOCYTESUR NEGATIVE 08/13/2023 1540   Sepsis Labs Recent Labs  Lab 08/10/23 0349 08/11/23 0430 08/12/23 0448 08/13/23 0340  WBC 5.6 5.8 6.9 8.2   Microbiology Recent Results (from the past 240 hours)  Aerobic/Anaerobic Culture w Gram Stain (surgical/deep wound)     Status: None   Collection Time: 08/05/23  4:56 AM   Specimen: Path fluid; Body Fluid  Result Value Ref Range Status   Specimen Description   Final    FLUID Performed at [facility], [address] [place], [name] [address]    Special Requests   Final    NONE Performed at [facility], [address], [place], [name] [address]    Gram Stain   Final    RARE WBC SEEN RARE GRAM POSITIVE COCCI RARE GRAM NEGATIVE RODS    Culture   Final  FEW MULTIPLE ORGANISMS PRESENT, NONE PREDOMINANT NO GROUP A STREP (S.PYOGENES) ISOLATED NO STAPHYLOCOCCUS AUREUS ISOLATED NO ANAEROBES ISOLATED Performed at [facility], [address] [place], [name] [address]    Report Status 08/10/2023 FINAL  Final  MRSA Next Gen by PCR, Nasal     Status: None   Collection Time: 08/05/23  7:53 AM   Specimen: Nasal Mucosa; Nasal Swab  Result Value Ref Range Status   MRSA by PCR Next Gen NOT DETECTED NOT DETECTED Final    Comment: (NOTE) The GeneXpert  MRSA Assay (FDA approved for NASAL specimens only), is one component of a comprehensive MRSA colonization surveillance program. It is not intended to diagnose MRSA infection nor to guide or monitor treatment for MRSA infections. Test performance is not FDA approved in patients less than 56 years old. Performed at [facility], [address] [place], [name] [address]    Time coordinating discharge: 44 mins  SIGNED:  Afton Louder, MD  [facility] 08/14/2023, 9:53 AM How to contact the [facility] Attending or Consulting provider 7A - 7P or covering provider during after hours 7P -7A, for this patient?  Check the care team in [facility] and look for a) attending/consulting [facility] provider listed and b) the TRH team listed Log into [email or web address] and use [facility]'s universal password to access. If you do not have the password, please contact the hospital operator. Locate the [facility] provider you are looking for under [facility] and page to a number that you can be directly reached. If you still have difficulty reaching the provider, please page the [facility] (Director on Call) for the Hospitalists listed on amion for assistance.

## 2023-08-14 NOTE — Progress Notes (Signed)
 Patient discharged home with instructions given on medications and follow up visits,verbalized understanding.Prescriptions sent to Pharmacy of choice documented on AVS. IV discontinued,catheter intact. Accompanied by staff to an awaiting vehicle.[facility] transport to transport patient home.

## 2023-08-14 NOTE — Progress Notes (Signed)
 [facility]  CT negative. Drain output still 200cc. Keep JP for now.  BP (!) 91/52 (BP Location: Left Arm)   Pulse 84   Temp 98.1 F (36.7 C) (Oral)   Resp 20   Ht 5' 2 (1.575 m)   Wt 56 kg   SpO2 97%   BMI 22.58 kg/m  Midline with staples, no erythema or drainage JP with serous fluid   Will see next week. Roxicodone  for pain GI says can take 2G tylenol  a day PPI BID  No NSAIDs   Future Appointments  Date Time Provider Department Center  08/22/2023 11:00 AM Kallie Manuelita BROCKS, MD [facility] None  11/29/2023 10:40 AM Branch, Dorn FALCON, MD [facility]     Manuelita Kallie, MD [facility] [address] [place], [name] [address] [phone] (office)

## 2023-08-14 NOTE — Progress Notes (Addendum)
 Nurse at bedside,patient alert and oriented to person,place,and situation,a little confused to time today.Patient states over all I feel better, it's just a little pain at the incision site and to the right of my incision. Patient assisted to, and from bathroom room with assistance using a front wheel walker times one assist. Blood pressure 91/52,heart rate 84,Dr Clanford Breaker notified.Plan of care on going.

## 2023-08-15 ENCOUNTER — Telehealth: Payer: Self-pay | Admitting: *Deleted

## 2023-08-15 ENCOUNTER — Telehealth: Payer: Self-pay

## 2023-08-15 LAB — PROTEIN, BODY FLUID (OTHER): Total Protein, Body Fluid Other: 2 g/dL

## 2023-08-15 NOTE — Transitions of Care (Post Inpatient/ED Visit) (Signed)
 08/15/2023  Name: Joan Wood MRN: [id number] DOB: 1970-05-14  Today's TOC FU Call Status: Today's TOC FU Call Status:: Successful TOC FU Call Completed TOC FU Call Complete Date: 08/15/23 Patient's Name and Date of Birth confirmed.  Transition Care Management Follow-up Telephone Call Date of Discharge: 08/14/23 [facility] Type of Discharge: Inpatient Admission Primary Inpatient Discharge Diagnosis:: peptic ulcer How have you been since you were released from the hospital?: Better Any questions or concerns?: No  Items Reviewed: Did you receive and understand the discharge instructions provided?: Yes Medications obtained,verified, and reconciled?: Yes (Medications Reviewed) Any new allergies since your discharge?: No Dietary orders reviewed?: Yes Do you have support at home?: No  Medications Reviewed Today: Medications Reviewed Today     Reviewed by Emmitt Pan, LPN (Licensed Practical Nurse) on 08/15/23 at 1056  Med List Status: <None>   Medication Order Taking? Sig Documenting Provider Last Dose Status Informant  bisacodyl  (DULCOLAX) 10 MG suppository [id number] Yes Place 1 suppository (10 mg total) rectally daily as needed for moderate constipation. Louder Afton CROME, MD  Active   Elastic Bandages & Supports (FITRITE BACK BRACE WITH PULLEY) MISC [id number] Yes 1 Units by Does not apply route daily. Bevely Doffing, FNP  Active Self, Pharmacy Records  folic acid  (FOLVITE ) 1 MG tablet [id number] Yes Take 1 tablet (1 mg total) by mouth daily. Zarwolo, Gloria, FNP  Active Self, Pharmacy Records           Med Note [phone], CHUCK KANDICE Repress May 27, 2023  7:43 PM)    methocarbamol  (ROBAXIN ) 500 MG tablet [id number] Yes Take 1 tablet (500 mg total) by mouth every 8 (eight) hours as needed for muscle spasms. Louder Afton CROME, MD  Active   Misc. Devices MISC [id number] Yes Closed toe walking boot for right foot toe fractures Dx: D07.698 Bevely Doffing, FNP   Active Self, Pharmacy Records  omeprazole  [facility]) 40 MG capsule [id number] Yes Take 1 capsule (40 mg total) by mouth 2 (two) times daily. Louder Afton CROME, MD  Active   oxyCODONE  (OXY IR/ROXICODONE ) 5 MG immediate release tablet [id number] Yes Take 1 tablet (5 mg total) by mouth every 6 (six) hours as needed for severe pain (pain score 7-10) or breakthrough pain. Economos, Clanford L, MD  Active   phenol (CHLORASEPTIC) 1.4 % LIQD [id number] Yes Use as directed 1 spray in the mouth or throat as needed for throat irritation / pain. Steers, Clanford L, MD  Active   sertraline  (ZOLOFT ) 25 MG tablet [id number] Yes Take 1 tablet (25 mg total) by mouth daily. Zarwolo, Gloria, FNP  Active Self, Pharmacy Records           Med Note DRENA, CHUCK KANDICE Repress Aug 05, 2023  8:38 PM) Pt has not started this medication yet  simethicone  (MYLICON) 80 MG chewable tablet [id number] Yes Chew 1 tablet (80 mg total) by mouth 4 (four) times daily as needed for flatulence. Louder Afton CROME, MD  Active   sucralfate  (CARAFATE ) 1 g tablet [id number] Yes Take 1 tablet (1 g total) by mouth 4 (four) times daily -  with meals and at bedtime. Moor, Clanford L, MD  Active   Vitamin D , Ergocalciferol , (DRISDOL ) 1.25 MG (50000 UNIT) CAPS capsule [id number] Yes Take 1 capsule (50,000 Units total) by mouth every 7 (seven) days. Zarwolo, Gloria, FNP  Active Self, Pharmacy Records           Med Note (WARD, ANGELICA G  Sun Aug 05, 2023  8:38 PM) Pt just received Rx for this med            Home Care and Equipment/Supplies: Were Home Health Services Ordered?: NA Any new equipment or medical supplies ordered?: NA  Functional Questionnaire: Do you need assistance with bathing/showering or dressing?: No Do you need assistance with meal preparation?: No Do you need assistance with eating?: No Do you have difficulty maintaining continence: No Do you need assistance with getting out of bed/getting out of a chair/moving?: No Do you  have difficulty managing or taking your medications?: No  Follow up appointments reviewed: PCP Follow-up appointment confirmed?: No (no avail appts, sent message to staff to schedule) Specialist Hospital Follow-up appointment confirmed?: No Reason Specialist Follow-Up Not Confirmed: Patient has Specialist Provider Number and will Call for Appointment Do you need transportation to your follow-up appointment?: No Do you understand care options if your condition(s) worsen?: Yes-patient verbalized understanding    SIGNATURE Julian Lemmings, LPN [facility] Nurse Health Advisor Direct Dial [phone]

## 2023-08-16 NOTE — Telephone Encounter (Signed)
 Noted. Maybe try calling 1 more time. If she has any other alternate contacts, maybe try those as well to see if they can get her to call us .

## 2023-08-16 NOTE — Telephone Encounter (Signed)
 Surgical Date: 08/05/2023 Procedure: Gastrorrhaphy, Ex Lap, Liver Biopsy, Umbilical Hernia Repair  Received call from patient [phone]~ telephone.   Patient reports that she has clear drainage from either midline incision or staple insertion sites. States that she does not see any active drainage, but she wakes with her t- shirt soaked. States that she wears a white t- shirt and notes that wet area is not discolored.   Denies fever, chills, N/V, swelling, redness surrounding incision, or pain.   Reports that JP drain remains in place and is draining appropriately. Reports that she is emptying collection 2- 3 x daily and is removing 75- 100 mL each time.   Advised that ascites fluid may be leaking out.   Discussed with Dr. Kallie. States that fluid may be leaking from around JP site. Advised to use split sponge gauze around tube to keep clothing clean/ dry. Advised to have patient come to office on Monday, 08/20/2023 for evaluation.   Call placed to patient and patient made aware. Verbalized understanding.

## 2023-08-19 ENCOUNTER — Emergency Department (HOSPITAL_COMMUNITY)

## 2023-08-19 ENCOUNTER — Inpatient Hospital Stay (HOSPITAL_COMMUNITY)
Admission: EM | Admit: 2023-08-19 | Discharge: 2023-08-22 | DRG: 919 | Disposition: A | Discharge status: Home / Self Care | Attending: Family Medicine | Admitting: Family Medicine

## 2023-08-19 ENCOUNTER — Encounter (HOSPITAL_COMMUNITY): Payer: Self-pay

## 2023-08-19 DIAGNOSIS — F17210 Nicotine dependence, cigarettes, uncomplicated: Secondary | ICD-10-CM | POA: Diagnosis present

## 2023-08-19 DIAGNOSIS — E871 Hypo-osmolality and hyponatremia: Secondary | ICD-10-CM | POA: Diagnosis present

## 2023-08-19 DIAGNOSIS — K529 Noninfective gastroenteritis and colitis, unspecified: Secondary | ICD-10-CM

## 2023-08-19 DIAGNOSIS — E876 Hypokalemia: Secondary | ICD-10-CM | POA: Diagnosis present

## 2023-08-19 DIAGNOSIS — Z720 Tobacco use: Secondary | ICD-10-CM

## 2023-08-19 DIAGNOSIS — Z91030 Bee allergy status: Secondary | ICD-10-CM

## 2023-08-19 DIAGNOSIS — F419 Anxiety disorder, unspecified: Secondary | ICD-10-CM | POA: Diagnosis present

## 2023-08-19 DIAGNOSIS — A0472 Enterocolitis due to Clostridium difficile, not specified as recurrent: Principal | ICD-10-CM | POA: Diagnosis present

## 2023-08-19 DIAGNOSIS — F339 Major depressive disorder, recurrent, unspecified: Secondary | ICD-10-CM

## 2023-08-19 DIAGNOSIS — K649 Unspecified hemorrhoids: Secondary | ICD-10-CM | POA: Diagnosis present

## 2023-08-19 DIAGNOSIS — Z79899 Other long term (current) drug therapy: Secondary | ICD-10-CM

## 2023-08-19 DIAGNOSIS — Z91040 Latex allergy status: Secondary | ICD-10-CM

## 2023-08-19 DIAGNOSIS — K7581 Nonalcoholic steatohepatitis (NASH): Secondary | ICD-10-CM | POA: Diagnosis present

## 2023-08-19 DIAGNOSIS — K828 Other specified diseases of gallbladder: Secondary | ICD-10-CM | POA: Diagnosis not present

## 2023-08-19 DIAGNOSIS — F1010 Alcohol abuse, uncomplicated: Secondary | ICD-10-CM | POA: Diagnosis present

## 2023-08-19 DIAGNOSIS — K7689 Other specified diseases of liver: Secondary | ICD-10-CM | POA: Diagnosis not present

## 2023-08-19 DIAGNOSIS — K3189 Other diseases of stomach and duodenum: Secondary | ICD-10-CM | POA: Diagnosis present

## 2023-08-19 DIAGNOSIS — E559 Vitamin D deficiency, unspecified: Secondary | ICD-10-CM

## 2023-08-19 DIAGNOSIS — T85638A Leakage of other specified internal prosthetic devices, implants and grafts, initial encounter: Principal | ICD-10-CM | POA: Diagnosis present

## 2023-08-19 DIAGNOSIS — I700 Atherosclerosis of aorta: Secondary | ICD-10-CM | POA: Diagnosis not present

## 2023-08-19 DIAGNOSIS — R112 Nausea with vomiting, unspecified: Secondary | ICD-10-CM | POA: Diagnosis present

## 2023-08-19 DIAGNOSIS — R609 Edema, unspecified: Secondary | ICD-10-CM | POA: Diagnosis not present

## 2023-08-19 DIAGNOSIS — R109 Unspecified abdominal pain: Secondary | ICD-10-CM | POA: Diagnosis present

## 2023-08-19 DIAGNOSIS — I10 Essential (primary) hypertension: Secondary | ICD-10-CM | POA: Diagnosis present

## 2023-08-19 DIAGNOSIS — F32A Depression, unspecified: Secondary | ICD-10-CM | POA: Diagnosis present

## 2023-08-19 DIAGNOSIS — K921 Melena: Secondary | ICD-10-CM

## 2023-08-19 DIAGNOSIS — K668 Other specified disorders of peritoneum: Principal | ICD-10-CM

## 2023-08-19 DIAGNOSIS — R1010 Upper abdominal pain, unspecified: Secondary | ICD-10-CM | POA: Diagnosis not present

## 2023-08-19 DIAGNOSIS — Z5982 Transportation insecurity: Secondary | ICD-10-CM

## 2023-08-19 DIAGNOSIS — Z807 Family history of other malignant neoplasms of lymphoid, hematopoietic and related tissues: Secondary | ICD-10-CM

## 2023-08-19 DIAGNOSIS — R160 Hepatomegaly, not elsewhere classified: Secondary | ICD-10-CM | POA: Diagnosis not present

## 2023-08-19 DIAGNOSIS — Z8711 Personal history of peptic ulcer disease: Secondary | ICD-10-CM

## 2023-08-19 DIAGNOSIS — K7031 Alcoholic cirrhosis of liver with ascites: Secondary | ICD-10-CM | POA: Diagnosis present

## 2023-08-19 DIAGNOSIS — G4730 Sleep apnea, unspecified: Secondary | ICD-10-CM | POA: Diagnosis present

## 2023-08-19 DIAGNOSIS — R110 Nausea: Secondary | ICD-10-CM | POA: Diagnosis not present

## 2023-08-19 DIAGNOSIS — R1013 Epigastric pain: Secondary | ICD-10-CM | POA: Diagnosis not present

## 2023-08-19 DIAGNOSIS — K219 Gastro-esophageal reflux disease without esophagitis: Secondary | ICD-10-CM | POA: Diagnosis present

## 2023-08-19 DIAGNOSIS — K275 Chronic or unspecified peptic ulcer, site unspecified, with perforation: Secondary | ICD-10-CM | POA: Diagnosis present

## 2023-08-19 DIAGNOSIS — K7030 Alcoholic cirrhosis of liver without ascites: Secondary | ICD-10-CM | POA: Diagnosis present

## 2023-08-19 DIAGNOSIS — K2101 Gastro-esophageal reflux disease with esophagitis, with bleeding: Secondary | ICD-10-CM | POA: Diagnosis present

## 2023-08-19 DIAGNOSIS — K256 Chronic or unspecified gastric ulcer with both hemorrhage and perforation: Secondary | ICD-10-CM | POA: Diagnosis present

## 2023-08-19 DIAGNOSIS — L98429 Non-pressure chronic ulcer of back with unspecified severity: Secondary | ICD-10-CM | POA: Diagnosis present

## 2023-08-19 DIAGNOSIS — R1084 Generalized abdominal pain: Secondary | ICD-10-CM | POA: Diagnosis not present

## 2023-08-19 HISTORY — DX: Personal history of other diseases of the digestive system: Z87.19

## 2023-08-19 HISTORY — DX: Sleep apnea, unspecified: G47.30

## 2023-08-19 HISTORY — DX: Cardiac arrhythmia, unspecified: I49.9

## 2023-08-19 LAB — URINALYSIS, ROUTINE W REFLEX MICROSCOPIC
Bilirubin Urine: NEGATIVE
Glucose, UA: NEGATIVE mg/dL
Ketones, ur: 5 mg/dL — AB
Leukocytes,Ua: NEGATIVE
Nitrite: NEGATIVE
Protein, ur: NEGATIVE mg/dL
Specific Gravity, Urine: 1.002 — ABNORMAL LOW (ref 1.005–1.030)
pH: 6 (ref 5.0–8.0)

## 2023-08-19 LAB — COMPREHENSIVE METABOLIC PANEL WITH GFR
ALT: 44 U/L (ref 0–44)
AST: 102 U/L — ABNORMAL HIGH (ref 15–41)
Albumin: 2.6 g/dL — ABNORMAL LOW (ref 3.5–5.0)
Alkaline Phosphatase: 95 U/L (ref 38–126)
Anion gap: 15 (ref 5–15)
BUN: <5 mg/dL — ABNORMAL LOW (ref 6–20)
CO2: 24 mmol/L (ref 22–32)
Calcium: 8.2 mg/dL — ABNORMAL LOW (ref 8.9–10.3)
Chloride: 90 mmol/L — ABNORMAL LOW (ref 98–111)
Creatinine, Ser: 0.37 mg/dL — ABNORMAL LOW (ref 0.44–1.00)
GFR, Estimated: >60 mL/min (ref >60)
Glucose, Bld: 84 mg/dL (ref 70–99)
Potassium: 3.5 mmol/L (ref 3.5–5.1)
Sodium: 129 mmol/L — ABNORMAL LOW (ref 135–145)
Total Bilirubin: 1.2 mg/dL (ref 0.0–1.2)
Total Protein: 7.3 g/dL (ref 6.5–8.1)

## 2023-08-19 LAB — CBC WITH DIFFERENTIAL/PLATELET
Abs Immature Granulocytes: 0.05 K/uL (ref 0.00–0.07)
Basophils Absolute: 0.1 K/uL (ref 0.0–0.1)
Basophils Relative: 1 %
Eosinophils Absolute: 0 K/uL (ref 0.0–0.5)
Eosinophils Relative: 1 %
HCT: 31.9 % — ABNORMAL LOW (ref 36.0–46.0)
Hemoglobin: 10.7 g/dL — ABNORMAL LOW (ref 12.0–15.0)
Immature Granulocytes: 1 %
Lymphocytes Relative: 22 %
Lymphs Abs: 1.2 K/uL (ref 0.7–4.0)
MCH: 29.6 pg (ref 26.0–34.0)
MCHC: 33.5 g/dL (ref 30.0–36.0)
MCV: 88.1 fL (ref 80.0–100.0)
Monocytes Absolute: 0.8 K/uL (ref 0.1–1.0)
Monocytes Relative: 14 %
Neutro Abs: 3.4 K/uL (ref 1.7–7.7)
Neutrophils Relative %: 61 %
Platelets: 330 K/uL (ref 150–400)
RBC: 3.62 MIL/uL — ABNORMAL LOW (ref 3.87–5.11)
RDW: 17.1 % — ABNORMAL HIGH (ref 11.5–15.5)
WBC: 5.6 K/uL (ref 4.0–10.5)
nRBC: 0 % (ref 0.0–0.2)

## 2023-08-19 LAB — LACTIC ACID, PLASMA: Lactic Acid, Venous: 1.8 mmol/L (ref 0.5–1.9)

## 2023-08-19 LAB — MAGNESIUM: Magnesium: 1.7 mg/dL (ref 1.7–2.4)

## 2023-08-19 MED ORDER — SODIUM CHLORIDE 0.9 % IV BOLUS
1000.0000 mL | Freq: Once | INTRAVENOUS | Status: AC
  Administered 2023-08-19: 1000 mL via INTRAVENOUS

## 2023-08-19 MED ORDER — MORPHINE SULFATE (PF) 4 MG/ML IV SOLN
4.0000 mg | Freq: Once | INTRAVENOUS | Status: AC
  Administered 2023-08-19: 4 mg via INTRAVENOUS
  Filled 2023-08-19: qty 1

## 2023-08-19 MED ORDER — ONDANSETRON HCL 4 MG/2ML IJ SOLN
4.0000 mg | Freq: Once | INTRAMUSCULAR | Status: AC
  Administered 2023-08-19: 4 mg via INTRAVENOUS
  Filled 2023-08-19: qty 2

## 2023-08-19 MED ORDER — IOHEXOL 300 MG/ML  SOLN
100.0000 mL | Freq: Once | INTRAMUSCULAR | Status: AC | PRN
  Administered 2023-08-19: 100 mL via INTRAVENOUS

## 2023-08-19 NOTE — ED Provider Notes (Signed)
 [place] EMERGENCY DEPARTMENT AT [facility] Provider Note   CSN: [id number] Arrival date & time: 08/19/23  2205     Patient presents with: Emesis and Abdominal Pain   Joan Wood is a 53 y.o. female.  {Add pertinent medical, surgical, social history, OB history to YEP:[id number]} Patient is a 53 year old female with history of perforated gastric ulcer treated surgically 2 weeks ago.  Patient has a JP drain.  She presents today with complaints of generalized abdominal pain, nausea, and feeling unwell.  No fevers or chills.  She describes episodes of emesis.  No bloody stool or vomit.  Pain worse with palpation and movement.  No alleviating factors.       Prior to Admission medications   Medication Sig Start Date End Date Taking? Authorizing Provider  bisacodyl  (DULCOLAX) 10 MG suppository Place 1 suppository (10 mg total) rectally daily as needed for moderate constipation. 08/14/23   Louder Afton CROME, MD  Elastic Bandages & Supports (FITRITE BACK BRACE WITH PULLEY) MISC 1 Units by Does not apply route daily. 05/25/23   Bevely Doffing, FNP  folic acid  (FOLVITE ) 1 MG tablet Take 1 tablet (1 mg total) by mouth daily. 03/29/23   Zarwolo, Gloria, FNP  methocarbamol  (ROBAXIN ) 500 MG tablet Take 1 tablet (500 mg total) by mouth every 8 (eight) hours as needed for muscle spasms. 08/14/23   Louder Afton CROME, MD  Misc. Devices MISC Closed toe walking boot for right foot toe fractures Dx: D07.698 05/25/23   Bevely Doffing, FNP  omeprazole  (PRILOSEC) 40 MG capsule Take 1 capsule (40 mg total) by mouth 2 (two) times daily. 08/14/23   Ben, Clanford L, MD  oxyCODONE  (OXY IR/ROXICODONE ) 5 MG immediate release tablet Take 1 tablet (5 mg total) by mouth every 6 (six) hours as needed for severe pain (pain score 7-10) or breakthrough pain. 08/14/23   Stiefel, Clanford L, MD  phenol (CHLORASEPTIC) 1.4 % LIQD Use as directed 1 spray in the mouth or throat as needed for throat irritation / pain.  08/14/23   Cichy, Clanford L, MD  sertraline  (ZOLOFT ) 25 MG tablet Take 1 tablet (25 mg total) by mouth daily. 07/30/23   Zarwolo, Gloria, FNP  simethicone  (MYLICON) 80 MG chewable tablet Chew 1 tablet (80 mg total) by mouth 4 (four) times daily as needed for flatulence. 08/14/23   Selvy, Clanford L, MD  sucralfate  (CARAFATE ) 1 g tablet Take 1 tablet (1 g total) by mouth 4 (four) times daily -  with meals and at bedtime. 08/14/23   Flippin, Clanford L, MD  Vitamin D , Ergocalciferol , (DRISDOL ) 1.25 MG (50000 UNIT) CAPS capsule Take 1 capsule (50,000 Units total) by mouth every 7 (seven) days. 08/04/23   Zarwolo, Gloria, FNP    Allergies: Bee venom, Fire ant, and Latex    Review of Systems  All other systems reviewed and are negative.   Updated Vital Signs BP 129/75   Pulse (!) 103   Temp 98.2 F (36.8 C) (Oral)   Resp 18   Ht 5' 2 (1.575 m)   Wt 55.3 kg   SpO2 99%   BMI 22.31 kg/m   Physical Exam Vitals and nursing note reviewed.  Constitutional:      General: She is not in acute distress.    Appearance: She is well-developed. She is not diaphoretic.  HENT:     Head: Normocephalic and atraumatic.  Cardiovascular:     Rate and Rhythm: Normal rate and regular rhythm.  Heart sounds: No murmur heard.    No friction rub. No gallop.  Pulmonary:     Effort: Pulmonary effort is normal. No respiratory distress.     Breath sounds: Normal breath sounds. No wheezing.  Abdominal:     General: Bowel sounds are normal. There is no distension.     Palpations: Abdomen is soft.     Tenderness: There is generalized abdominal tenderness. There is no guarding or rebound.     Comments: There is generalized abdominal tenderness, but no rebound or guarding.  There is a JP drain placed in the epigastric area.  There is some yellowish material noted on the dressing, but no purulent drainage.  The patient the surgical incision still has staples in place and the wound itself is well-appearing.   Musculoskeletal:        General: Normal range of motion.     Cervical back: Normal range of motion and neck supple.  Skin:    General: Skin is warm and dry.  Neurological:     General: No focal deficit present.     Mental Status: She is alert and oriented to person, place, and time.     (all labs ordered are listed, but only abnormal results are displayed) Labs Reviewed  CBC WITH DIFFERENTIAL/PLATELET - Abnormal; Notable for the following components:      Result Value   RBC 3.62 (*)    Hemoglobin 10.7 (*)    HCT 31.9 (*)    RDW 17.1 (*)    All other components within normal limits  COMPREHENSIVE METABOLIC PANEL WITH GFR - Abnormal; Notable for the following components:   Sodium 129 (*)    Chloride 90 (*)    BUN <5 (*)    Creatinine, Ser 0.37 (*)    Calcium  8.2 (*)    Albumin 2.6 (*)    AST 102 (*)    All other components within normal limits  URINALYSIS, ROUTINE W REFLEX MICROSCOPIC - Abnormal; Notable for the following components:   Specific Gravity, Urine 1.002 (*)    Hgb urine dipstick SMALL (*)    Ketones, ur 5 (*)    Bacteria, UA RARE (*)    All other components within normal limits  CULTURE, BLOOD (ROUTINE X 2)  CULTURE, BLOOD (ROUTINE X 2)  MAGNESIUM   LACTIC ACID, PLASMA  LIPASE, BLOOD  LACTIC ACID, PLASMA    EKG: None  Radiology: DG Chest Port 1 View Result Date: 08/19/2023 CLINICAL DATA:  upper abd pain EXAM: PORTABLE CHEST 1 VIEW COMPARISON:  Chest x-ray 08/04/2023 FINDINGS: The heart and mediastinal contours are unchanged. Atherosclerotic plaque. No focal consolidation. No pulmonary edema. No pleural effusion. No pneumothorax. No acute osseous abnormality. IMPRESSION: 1. No active disease. 2.  Aortic Atherosclerosis (ICD10-I70.0). Electronically Signed   By: Morgane  Naveau M.D.   On: 08/19/2023 22:50    {Document cardiac monitor, telemetry assessment procedure when appropriate:32947} Procedures   Medications Ordered in the ED  ondansetron  (ZOFRAN )  injection 4 mg (has no administration in time range)  sodium chloride  0.9 % bolus 1,000 mL (has no administration in time range)  morphine  (PF) 4 MG/ML injection 4 mg (has no administration in time range)      {Click here for ABCD2, HEART and other calculators REFRESH Note before signing:1}                              Medical Decision Making Amount and/or Complexity of  Data Reviewed Labs: ordered. Radiology: ordered.  Risk Prescription drug management.   ***  {Document critical care time when appropriate  Document review of labs and clinical decision tools ie CHADS2VASC2, etc  Document your independent review of radiology images and any outside records  Document your discussion with family members, caretakers and with consultants  Document social determinants of health affecting pt's care  Document your decision making why or why not admission, treatments were needed:32947:::1}   Final diagnoses:  None    ED Discharge Orders     None

## 2023-08-19 NOTE — ED Triage Notes (Addendum)
 Pt comes from home by EMS for n/v/d and abd. Pain. Pt hadhad surgery about 10 days ago here and a drain was placed. Upper quad pain. Pt states her vomit is yellow and her stool is very loose and black. A&Ox4.  Per EMS stable VS and did not give anything.

## 2023-08-20 ENCOUNTER — Encounter: Admitting: General Surgery

## 2023-08-20 ENCOUNTER — Inpatient Hospital Stay (HOSPITAL_COMMUNITY)

## 2023-08-20 ENCOUNTER — Encounter (HOSPITAL_COMMUNITY): Payer: Self-pay | Admitting: Internal Medicine

## 2023-08-20 ENCOUNTER — Other Ambulatory Visit: Payer: Self-pay

## 2023-08-20 DIAGNOSIS — K828 Other specified diseases of gallbladder: Secondary | ICD-10-CM | POA: Diagnosis not present

## 2023-08-20 DIAGNOSIS — F32A Depression, unspecified: Secondary | ICD-10-CM | POA: Diagnosis not present

## 2023-08-20 DIAGNOSIS — E871 Hypo-osmolality and hyponatremia: Secondary | ICD-10-CM | POA: Diagnosis not present

## 2023-08-20 DIAGNOSIS — Z91040 Latex allergy status: Secondary | ICD-10-CM | POA: Diagnosis not present

## 2023-08-20 DIAGNOSIS — K7689 Other specified diseases of liver: Secondary | ICD-10-CM | POA: Diagnosis not present

## 2023-08-20 DIAGNOSIS — F1010 Alcohol abuse, uncomplicated: Secondary | ICD-10-CM | POA: Diagnosis present

## 2023-08-20 DIAGNOSIS — K7460 Unspecified cirrhosis of liver: Secondary | ICD-10-CM

## 2023-08-20 DIAGNOSIS — R1084 Generalized abdominal pain: Secondary | ICD-10-CM | POA: Diagnosis not present

## 2023-08-20 DIAGNOSIS — R1010 Upper abdominal pain, unspecified: Secondary | ICD-10-CM

## 2023-08-20 DIAGNOSIS — R933 Abnormal findings on diagnostic imaging of other parts of digestive tract: Secondary | ICD-10-CM

## 2023-08-20 DIAGNOSIS — K7031 Alcoholic cirrhosis of liver with ascites: Secondary | ICD-10-CM | POA: Diagnosis present

## 2023-08-20 DIAGNOSIS — R748 Abnormal levels of other serum enzymes: Secondary | ICD-10-CM | POA: Diagnosis not present

## 2023-08-20 DIAGNOSIS — K2101 Gastro-esophageal reflux disease with esophagitis, with bleeding: Secondary | ICD-10-CM | POA: Diagnosis not present

## 2023-08-20 DIAGNOSIS — K7030 Alcoholic cirrhosis of liver without ascites: Secondary | ICD-10-CM | POA: Diagnosis not present

## 2023-08-20 DIAGNOSIS — E876 Hypokalemia: Secondary | ICD-10-CM | POA: Diagnosis not present

## 2023-08-20 DIAGNOSIS — G4730 Sleep apnea, unspecified: Secondary | ICD-10-CM | POA: Diagnosis not present

## 2023-08-20 DIAGNOSIS — K668 Other specified disorders of peritoneum: Secondary | ICD-10-CM | POA: Diagnosis not present

## 2023-08-20 DIAGNOSIS — K256 Chronic or unspecified gastric ulcer with both hemorrhage and perforation: Secondary | ICD-10-CM | POA: Diagnosis not present

## 2023-08-20 DIAGNOSIS — T85638A Leakage of other specified internal prosthetic devices, implants and grafts, initial encounter: Secondary | ICD-10-CM | POA: Diagnosis not present

## 2023-08-20 DIAGNOSIS — Z79899 Other long term (current) drug therapy: Secondary | ICD-10-CM | POA: Diagnosis not present

## 2023-08-20 DIAGNOSIS — K529 Noninfective gastroenteritis and colitis, unspecified: Secondary | ICD-10-CM

## 2023-08-20 DIAGNOSIS — K625 Hemorrhage of anus and rectum: Secondary | ICD-10-CM

## 2023-08-20 DIAGNOSIS — R195 Other fecal abnormalities: Secondary | ICD-10-CM | POA: Diagnosis not present

## 2023-08-20 DIAGNOSIS — K275 Chronic or unspecified peptic ulcer, site unspecified, with perforation: Secondary | ICD-10-CM | POA: Diagnosis not present

## 2023-08-20 DIAGNOSIS — K921 Melena: Secondary | ICD-10-CM | POA: Diagnosis not present

## 2023-08-20 DIAGNOSIS — I700 Atherosclerosis of aorta: Secondary | ICD-10-CM | POA: Diagnosis not present

## 2023-08-20 DIAGNOSIS — Z807 Family history of other malignant neoplasms of lymphoid, hematopoietic and related tissues: Secondary | ICD-10-CM | POA: Diagnosis not present

## 2023-08-20 DIAGNOSIS — R109 Unspecified abdominal pain: Secondary | ICD-10-CM | POA: Diagnosis not present

## 2023-08-20 DIAGNOSIS — K3189 Other diseases of stomach and duodenum: Secondary | ICD-10-CM | POA: Diagnosis not present

## 2023-08-20 DIAGNOSIS — R1013 Epigastric pain: Secondary | ICD-10-CM | POA: Diagnosis not present

## 2023-08-20 DIAGNOSIS — Z91030 Bee allergy status: Secondary | ICD-10-CM | POA: Diagnosis not present

## 2023-08-20 DIAGNOSIS — L98429 Non-pressure chronic ulcer of back with unspecified severity: Secondary | ICD-10-CM | POA: Diagnosis not present

## 2023-08-20 DIAGNOSIS — F17210 Nicotine dependence, cigarettes, uncomplicated: Secondary | ICD-10-CM | POA: Diagnosis not present

## 2023-08-20 DIAGNOSIS — K259 Gastric ulcer, unspecified as acute or chronic, without hemorrhage or perforation: Secondary | ICD-10-CM | POA: Diagnosis not present

## 2023-08-20 DIAGNOSIS — A0472 Enterocolitis due to Clostridium difficile, not specified as recurrent: Secondary | ICD-10-CM | POA: Diagnosis not present

## 2023-08-20 DIAGNOSIS — K7581 Nonalcoholic steatohepatitis (NASH): Secondary | ICD-10-CM | POA: Diagnosis not present

## 2023-08-20 DIAGNOSIS — Z5982 Transportation insecurity: Secondary | ICD-10-CM | POA: Diagnosis not present

## 2023-08-20 DIAGNOSIS — R160 Hepatomegaly, not elsewhere classified: Secondary | ICD-10-CM | POA: Diagnosis not present

## 2023-08-20 DIAGNOSIS — K649 Unspecified hemorrhoids: Secondary | ICD-10-CM | POA: Diagnosis not present

## 2023-08-20 DIAGNOSIS — F1090 Alcohol use, unspecified, uncomplicated: Secondary | ICD-10-CM

## 2023-08-20 DIAGNOSIS — K219 Gastro-esophageal reflux disease without esophagitis: Secondary | ICD-10-CM | POA: Diagnosis not present

## 2023-08-20 DIAGNOSIS — I10 Essential (primary) hypertension: Secondary | ICD-10-CM | POA: Diagnosis not present

## 2023-08-20 DIAGNOSIS — F419 Anxiety disorder, unspecified: Secondary | ICD-10-CM | POA: Diagnosis not present

## 2023-08-20 LAB — CBC WITH DIFFERENTIAL/PLATELET
Abs Immature Granulocytes: 0.11 K/uL — ABNORMAL HIGH (ref 0.00–0.07)
Basophils Absolute: 0 K/uL (ref 0.0–0.1)
Basophils Relative: 1 %
Eosinophils Absolute: 0.1 K/uL (ref 0.0–0.5)
Eosinophils Relative: 2 %
HCT: 29.6 % — ABNORMAL LOW (ref 36.0–46.0)
Hemoglobin: 9.7 g/dL — ABNORMAL LOW (ref 12.0–15.0)
Immature Granulocytes: 2 %
Lymphocytes Relative: 13 %
Lymphs Abs: 0.7 K/uL (ref 0.7–4.0)
MCH: 29.8 pg (ref 26.0–34.0)
MCHC: 32.8 g/dL (ref 30.0–36.0)
MCV: 90.8 fL (ref 80.0–100.0)
Monocytes Absolute: 0.5 K/uL (ref 0.1–1.0)
Monocytes Relative: 9 %
Neutro Abs: 4 K/uL (ref 1.7–7.7)
Neutrophils Relative %: 73 %
Platelets: 147 K/uL — ABNORMAL LOW (ref 150–400)
RBC: 3.26 MIL/uL — ABNORMAL LOW (ref 3.87–5.11)
RDW: 17.4 % — ABNORMAL HIGH (ref 11.5–15.5)
WBC: 5.5 K/uL (ref 4.0–10.5)
nRBC: 0 % (ref 0.0–0.2)

## 2023-08-20 LAB — C DIFFICILE QUICK SCREEN W PCR REFLEX
C Diff antigen: POSITIVE — AB
C Diff toxin: NEGATIVE

## 2023-08-20 LAB — LIPASE, BLOOD
Lipase: 103 U/L — ABNORMAL HIGH (ref 11–51)
Lipase: 61 U/L — ABNORMAL HIGH (ref 11–51)

## 2023-08-20 LAB — COMPREHENSIVE METABOLIC PANEL WITH GFR
ALT: 40 U/L (ref 0–44)
AST: 103 U/L — ABNORMAL HIGH (ref 15–41)
Albumin: 2.3 g/dL — ABNORMAL LOW (ref 3.5–5.0)
Alkaline Phosphatase: 89 U/L (ref 38–126)
Anion gap: 13 (ref 5–15)
BUN: <5 mg/dL — ABNORMAL LOW (ref 6–20)
CO2: 23 mmol/L (ref 22–32)
Calcium: 8 mg/dL — ABNORMAL LOW (ref 8.9–10.3)
Chloride: 93 mmol/L — ABNORMAL LOW (ref 98–111)
Creatinine, Ser: 0.35 mg/dL — ABNORMAL LOW (ref 0.44–1.00)
GFR, Estimated: >60 mL/min (ref >60)
Glucose, Bld: 52 mg/dL — ABNORMAL LOW (ref 70–99)
Potassium: 3.8 mmol/L (ref 3.5–5.1)
Sodium: 129 mmol/L — ABNORMAL LOW (ref 135–145)
Total Bilirubin: 1.5 mg/dL — ABNORMAL HIGH (ref 0.0–1.2)
Total Protein: 6.5 g/dL (ref 6.5–8.1)

## 2023-08-20 LAB — PROTIME-INR
INR: 1.3 — ABNORMAL HIGH (ref 0.8–1.2)
Prothrombin Time: 16.6 s — ABNORMAL HIGH (ref 11.4–15.2)

## 2023-08-20 MED ORDER — PANTOPRAZOLE SODIUM 40 MG IV SOLR
40.0000 mg | Freq: Two times a day (BID) | INTRAVENOUS | Status: DC
  Administered 2023-08-20 – 2023-08-22 (×8): 40 mg via INTRAVENOUS
  Filled 2023-08-20 (×4): qty 10

## 2023-08-20 MED ORDER — MORPHINE SULFATE (PF) 4 MG/ML IV SOLN
4.0000 mg | Freq: Once | INTRAVENOUS | Status: AC
  Administered 2023-08-20 (×2): 4 mg via INTRAVENOUS
  Filled 2023-08-20: qty 1

## 2023-08-20 MED ORDER — SERTRALINE HCL 25 MG PO TABS
25.0000 mg | ORAL_TABLET | Freq: Every day | ORAL | Status: DC
  Administered 2023-08-20 – 2023-08-22 (×6): 25 mg via ORAL
  Filled 2023-08-20 (×3): qty 1

## 2023-08-20 MED ORDER — LACTATED RINGERS IV SOLN: INTRAVENOUS | Status: AC

## 2023-08-20 MED ORDER — HYDROCORTISONE ACETATE 25 MG RE SUPP
25.0000 mg | Freq: Two times a day (BID) | RECTAL | Status: DC
  Administered 2023-08-20 – 2023-08-21 (×4): 25 mg via RECTAL
  Filled 2023-08-20 (×4): qty 1

## 2023-08-20 MED ORDER — IOHEXOL 300 MG/ML  SOLN
50.0000 mL | Freq: Once | INTRAMUSCULAR | Status: AC | PRN
  Administered 2023-08-20 (×2): 50 mL via ORAL

## 2023-08-20 MED ORDER — PIPERACILLIN-TAZOBACTAM 3.375 G IVPB
3.3750 g | Freq: Three times a day (TID) | INTRAVENOUS | Status: DC
  Administered 2023-08-20 – 2023-08-21 (×8): 3.375 g via INTRAVENOUS
  Filled 2023-08-20 (×4): qty 50

## 2023-08-20 MED ORDER — PANTOPRAZOLE SODIUM 40 MG IV SOLR
40.0000 mg | INTRAVENOUS | Status: DC
  Administered 2023-08-20 (×2): 40 mg via INTRAVENOUS
  Filled 2023-08-20: qty 10

## 2023-08-20 MED ORDER — MORPHINE SULFATE (PF) 2 MG/ML IV SOLN
2.0000 mg | Freq: Once | INTRAVENOUS | Status: AC
  Administered 2023-08-20 (×2): 2 mg via INTRAVENOUS
  Filled 2023-08-20: qty 1

## 2023-08-20 MED ORDER — OXYCODONE HCL 5 MG PO TABS: 5.0000 mg | ORAL_TABLET | Freq: Four times a day (QID) | ORAL | Status: DC | PRN

## 2023-08-20 MED ORDER — ACETAMINOPHEN 325 MG PO TABS: 650.0000 mg | ORAL_TABLET | Freq: Four times a day (QID) | ORAL | Status: DC | PRN

## 2023-08-20 MED ORDER — ONDANSETRON HCL 4 MG/2ML IJ SOLN
4.0000 mg | Freq: Once | INTRAMUSCULAR | Status: AC
  Administered 2023-08-20 (×2): 4 mg via INTRAVENOUS
  Filled 2023-08-20: qty 2

## 2023-08-20 MED ORDER — MORPHINE SULFATE (PF) 4 MG/ML IV SOLN
4.0000 mg | INTRAVENOUS | Status: DC | PRN
  Administered 2023-08-20 – 2023-08-21 (×12): 4 mg via INTRAVENOUS
  Filled 2023-08-20 (×6): qty 1

## 2023-08-20 MED ORDER — ONDANSETRON HCL 4 MG/2ML IJ SOLN
4.0000 mg | Freq: Four times a day (QID) | INTRAMUSCULAR | Status: DC | PRN
  Administered 2023-08-20 – 2023-08-22 (×8): 4 mg via INTRAVENOUS
  Filled 2023-08-20 (×4): qty 2

## 2023-08-20 MED ORDER — LORAZEPAM 2 MG/ML IJ SOLN
0.5000 mg | Freq: Once | INTRAMUSCULAR | Status: AC
  Administered 2023-08-20 (×2): 0.5 mg via INTRAVENOUS
  Filled 2023-08-20: qty 1

## 2023-08-20 MED ORDER — SUCRALFATE 1 G PO TABS
1.0000 g | ORAL_TABLET | Freq: Three times a day (TID) | ORAL | Status: DC
  Administered 2023-08-20 – 2023-08-22 (×14): 1 g via ORAL
  Filled 2023-08-20 (×7): qty 1

## 2023-08-20 MED ORDER — ACETAMINOPHEN 650 MG RE SUPP: 650.0000 mg | Freq: Four times a day (QID) | RECTAL | Status: DC | PRN

## 2023-08-20 MED ORDER — ONDANSETRON HCL 4 MG PO TABS
4.0000 mg | ORAL_TABLET | Freq: Four times a day (QID) | ORAL | Status: DC | PRN
  Administered 2023-08-21 – 2023-08-22 (×4): 4 mg via ORAL
  Filled 2023-08-20: qty 1

## 2023-08-20 NOTE — Consult Note (Signed)
 Gastroenterology Consult   Referring Provider: Dr. Rendall Carwin Primary Care Physician:  Zarwolo, Gloria, FNP Primary Gastroenterologist:  Dr. Cindie  Patient ID: Joan Wood; [id number]; 05-Jan-1971   Admit date: 08/19/2023  LOS: 0 days   Date of Consultation: 08/20/2023  Reason for Consultation:  Rectal bleeding  History of Present Illness   Joan Wood is a 53 y.o. year old female with a history of ETOH abuse, cirrhosis felt likely due to ETOH, GERD, HTN, SVT, periumbilical hernia, chronic diarrhea, liver lesion in past with normal AFP, GI bleed in May 2025 with findings of PUD on EGD, lost to follow-up and surveillance EGD, gastric perforation July 2025 and underwent exploratory laparotomy with gastrorrhaphy, omental patch, wedge liver biopsy, primary umbilical hernia repair, JP drain placement, chronic anemia and receiving blood transfusion during last hospitalization, discharged 08/14/23 status post surgery and returning yesterday to the ED with abdominal pain, nausea, and vomiting. GI has now been consulted due to rectal bleeding.   In the ED, CT abd/pelvis with contrast with new small amount of free air in LUQ, diffuse wall thickening and edema of gastric antrum worrisome for ulceration, wall thickening and edema of ascending colon and proximal transverse colon, further findings as below. UGI completed thereafter without evidence of contrast extravasation.   Labs on admission: no leukocytosis. Hgb 10.7, close to baseline. Sodium 129, potassium 3.5, creatinine 0.37, AST 102, ALT 44, Alk Phos 95, Tbili 1.2, lipase 103. INR 1.3 today, Hgb 9.7, slightly down from yesterday.   Today reports:  Discharged from hospital on 8/5. She is unable to tell me if she drank alcohol as she doesn't remember. States her first meal was a [place] sandwich Friday evening. N/V/D starting around 0600 on Saturday. Laid down and then had severe pain that she states was from her head all the way down to  her toes, her back, abdomen. had dark stool and bright red blood per rectum starting yesterday morning, after she had multiple looser bowel movements. By Saturday evening, vomiting and diarrhea was improving and under better control. Woke up Sunday am with severe nausea. Last evidence of rectal bleeding was yesterday. 3 loose stools today. Some with twinges of red. No rectal burning. Notes prior history of bleeding with hemorrhoids. Notes she has frequent stools over the past year and has noted intermittent bleeding.   Abdominal pain is more manageable right now but slowly coming back as pain medication weaning off. Nausea returning. States Tmax of 99.8. This is documented around noon. States her baseline bowel habits are several loose stools a day.   Pain diffusely across abdomen but more intense right-sided.   Omeprazole  BID was recommended at discharge but was taking it once a day. No ibuprofen.     ENDOSCOPIC evaluations EGD May 2025 while inpatient: LA Grade D esophagitis, portal gastropathy, 15 mm non-bleeding gastric ulcer with clean base s/p biopsy, 3 additional non-bleeding gastric ulcers with largest 4 mm, normal duodenum. Surveillance in 8-10 weeks. NEGATIVE h.pylori.    REMOTE colonoscopy about 20 years ago with polyps   No FH colon cancer or polyps   Past Medical History:  Diagnosis Date   Anxiety    Dysrhythmia    GERD (gastroesophageal reflux disease)    Hernia of abdominal wall    History of hiatal hernia    Sleep apnea    SVT (supraventricular tachycardia) (HCC)     Past Surgical History:  Procedure Laterality Date   ESOPHAGOGASTRODUODENOSCOPY N/A 05/17/2023   Procedure: EGD (  ESOPHAGOGASTRODUODENOSCOPY);  Surgeon: Cindie Carlin POUR, DO;  [facility]: [facility];  Service: Endoscopy;  Laterality: N/A;   FINGER FRACTURE SURGERY     FRACTURE SURGERY     GASTRORRHAPHY N/A 08/05/2023   Procedure: GASTRORRHAPHY;  Surgeon: Kallie Manuelita BROCKS, MD;  Location: [facility];   Service: General;  Laterality: N/A;   LAPAROTOMY N/A 08/05/2023   Procedure: LAPAROTOMY, EXPLORATORY;  Surgeon: Kallie Manuelita BROCKS, MD;  Location: [facility];  Service: General;  Laterality: N/A;   LIVER BIOPSY N/A 08/05/2023   Procedure: BIOPSY, LIVER;  Surgeon: Kallie Manuelita BROCKS, MD;  Location: [facility];  Service: General;  Laterality: N/A;   right ankle repair     age 64   UMBILICAL HERNIA REPAIR N/A 08/05/2023   Procedure: REPAIR, HERNIA, UMBILICAL, ADULT;  Surgeon: Kallie Manuelita BROCKS, MD;  Location: [facility];  Service: General;  Laterality: N/A;    Prior to Admission medications   Medication Sig Start Date End Date Taking? Authorizing Provider  omeprazole  (PRILOSEC) 40 MG capsule Take 1 capsule (40 mg total) by mouth 2 (two) times daily. 08/14/23  Yes Rinn, Clanford L, MD  oxyCODONE  (OXY IR/ROXICODONE ) 5 MG immediate release tablet Take 1 tablet (5 mg total) by mouth every 6 (six) hours as needed for severe pain (pain score 7-10) or breakthrough pain. 08/14/23  Yes Artz, Clanford L, MD  phenol (CHLORASEPTIC) 1.4 % LIQD Use as directed 1 spray in the mouth or throat as needed for throat irritation / pain. 08/14/23  Yes Trice, Clanford L, MD  simethicone  (MYLICON) 80 MG chewable tablet Chew 1 tablet (80 mg total) by mouth 4 (four) times daily as needed for flatulence. 08/14/23  Yes Spagnuolo, Clanford L, MD  bisacodyl  (DULCOLAX) 10 MG suppository Place 1 suppository (10 mg total) rectally daily as needed for moderate constipation. 08/14/23   Vicci Afton CROME, MD  Elastic Bandages & Supports (FITRITE BACK BRACE WITH PULLEY) MISC 1 Units by Does not apply route daily. 05/25/23   Bevely Doffing, FNP  methocarbamol  (ROBAXIN ) 500 MG tablet Take 1 tablet (500 mg total) by mouth every 8 (eight) hours as needed for muscle spasms. 08/14/23   Vicci Afton CROME, MD  Misc. Devices MISC Closed toe walking boot for right foot toe fractures Dx: D07.698 05/25/23   Bevely Doffing, FNP  sertraline  (ZOLOFT ) 25 MG tablet  Take 1 tablet (25 mg total) by mouth daily. 07/30/23   Zarwolo, Gloria, FNP  sucralfate  (CARAFATE ) 1 g tablet Take 1 tablet (1 g total) by mouth 4 (four) times daily -  with meals and at bedtime. 08/14/23   Marcom, Clanford L, MD  Vitamin D , Ergocalciferol , (DRISDOL ) 1.25 MG (50000 UNIT) CAPS capsule Take 1 capsule (50,000 Units total) by mouth every 7 (seven) days. 08/04/23   Zarwolo, Gloria, FNP    Current Facility-Administered Medications  Medication Dose Route Frequency Provider Last Rate Last Admin   acetaminophen  (TYLENOL ) tablet 650 mg  650 mg Oral Q6H PRN Dorrell, Robert, MD       Or   acetaminophen  (TYLENOL ) suppository 650 mg  650 mg Rectal Q6H PRN Dena Charleston, MD       lactated ringers  infusion   Intravenous Continuous Dena Charleston, MD 75 mL/hr at 08/20/23 0316 New Bag at 08/20/23 0316   morphine  (PF) 4 MG/ML injection 4 mg  4 mg Intravenous Q2H PRN Dena Charleston, MD   4 mg at 08/20/23 0314   ondansetron  (ZOFRAN ) tablet 4 mg  4 mg Oral Q6H PRN Dena Charleston, MD  Or   ondansetron  (ZOFRAN ) injection 4 mg  4 mg Intravenous Q6H PRN Dena Charleston, MD       oxyCODONE  (Oxy IR/ROXICODONE ) immediate release tablet 5 mg  5 mg Oral Q6H PRN Dorrell, Robert, MD       pantoprazole  (PROTONIX ) injection 40 mg  40 mg Intravenous Q12H Kallie Manuelita BROCKS, MD       piperacillin -tazobactam (ZOSYN ) IVPB 3.375 g  3.375 g Intravenous Q8H Clair Lynwood CROME, RPH 12.5 mL/hr at 08/20/23 0320 3.375 g at 08/20/23 0320   sertraline  (ZOLOFT ) tablet 25 mg  25 mg Oral Daily Dena Charleston, MD   25 mg at 08/20/23 9074    Allergies as of 08/19/2023 - Review Complete 08/19/2023  Allergen Reaction Noted   Bee venom Anaphylaxis 08/14/2020   Fire ant Hives and Rash 08/05/2023   Latex Rash 05/11/2023    Family History  Problem Relation Age of Onset   Cancer Mother    Leukemia Mother    Multiple myeloma Mother    Cancer Father        unsure what kind   Cancer - Colon Neg Hx    Colon polyps Neg Hx      Social History   Socioeconomic History   Marital status: Single    Spouse name: Not on file   Number of children: 4   Years of education: Not on file   Highest education level: Not on file  Occupational History   Not on file  Tobacco Use   Smoking status: Every Day    Current packs/day: 0.50    Average packs/day: 0.7 packs/day for 38.4 years (28.5 ttl pk-yrs)    Types: Cigarettes    Start date: 03/2022   Smokeless tobacco: Never  Vaping Use   Vaping status: Never Used  Substance and Sexual Activity   Alcohol use: Yes    Alcohol/week: 2.0 standard drinks of alcohol    Types: 2 Cans of beer per week    Comment: beer,2-3 beers in a weeks time   Drug use: Never   Sexual activity: Not Currently    Birth control/protection: Abstinence  Other Topics Concern   Not on file  Social History Narrative   Pt states she has had 4 children, however, one child died died of SIDS at age 9.5 months. Pt is divorced. Pt has a female roommate in  which she lives with. Pt states she is cutting down on drinking beer and now drinks 2-3 beers per week and had her last beer yesterday morning. Pt still smokes but is cutting down and only smokes 3 cigarettes per day now.   Social Drivers of Corporate investment banker Strain: Not on file  Food Insecurity: No Food Insecurity (08/20/2023)   Hunger Vital Sign    Worried About Running Out of Food in the Last Year: Never true    Ran Out of Food in the Last Year: Never true  Transportation Needs: Unmet Transportation Needs (08/20/2023)   PRAPARE - Administrator, Civil Service (Medical): Yes    Lack of Transportation (Non-Medical): Yes  Physical Activity: Not on file  Stress: Not on file  Social Connections: Socially Isolated (08/20/2023)   Social Connection and Isolation Panel    Frequency of Communication with Friends and Family: Three times a week    Frequency of Social Gatherings with Friends and Family: Three times a week    Attends  Religious Services: Never    Active Member of Clubs or  Organizations: No    Attends Banker Meetings: Never    Marital Status: Never married  Intimate Partner Violence: Not At Risk (08/20/2023)   Humiliation, Afraid, Rape, and Kick questionnaire    Fear of Current or Ex-Partner: No    Emotionally Abused: No    Physically Abused: No    Sexually Abused: No     Review of Systems   See HPI   Physical Exam   Vital Signs in last 24 hours: Temp:  [97.7 F (36.5 C)-99.8 F (37.7 C)] 99.8 F (37.7 C) (08/11 1213) Pulse Rate:  [95-108] 108 (08/11 1213) Resp:  [15-20] 16 (08/11 1213) BP: (95-141)/(57-89) 117/70 (08/11 1213) SpO2:  [92 %-99 %] 97 % (08/11 1213) Weight:  [55.3 kg] 55.3 kg (08/10 2213) Last BM Date : 08/20/23  General:   Alert,  Well-developed, well-nourished, pleasant and cooperative in NAD Head:  Normocephalic and atraumatic. Eyes:  Sclera clear, no icterus.    Ears:  Normal auditory acuity. Mouth:  No deformity or lesions, dentition normal. Neck:  Supple; no masses Lungs:  Clear throughout to auscultation.    Heart:  S1 S2 present without murmurs Abdomen:  distended but soft, TTP diffusely but predominantly right-sided abdomen, JP drain in place with serous output, ABD pad across incisions.   Rectal: deferred   Msk:  Symmetrical without gross deformities. Normal posture. Extremities:  Without edema. Neurologic:  Alert and  oriented x4. Skin:  Intact without significant lesions or rashes. Psych:  Alert and cooperative. Normal mood and affect.  Intake/Output from previous day: 08/10 0701 - 08/11 0700 In: -  Out: 100 [Drains:100] Intake/Output this shift: Total I/O In: -  Out: 90 [Drains:90]    Labs/Studies   Recent Labs Recent Labs    08/19/23 2219  WBC 5.6  HGB 10.7*  HCT 31.9*  PLT 330   BMET Recent Labs    08/19/23 2219  NA 129*  K 3.5  CL 90*  CO2 24  GLUCOSE 84  BUN <5*  CREATININE 0.37*  CALCIUM  8.2*   LFT Recent  Labs    08/19/23 2219  PROT 7.3  ALBUMIN 2.6*  AST 102*  ALT 44  ALKPHOS 95  BILITOT 1.2   Lab Results  Component Value Date   INR 1.3 (H) 08/20/2023   INR 1.5 (H) 08/14/2023   INR 1.4 (H) 08/13/2023     Radiology/Studies CT ABDOMEN PELVIS W CONTRAST Result Date: 08/19/2023 CLINICAL DATA:  Acute abdominal pain EXAM: CT ABDOMEN AND PELVIS WITH CONTRAST TECHNIQUE: Multidetector CT imaging of the abdomen and pelvis was performed using the standard protocol following bolus administration of intravenous contrast. RADIATION DOSE REDUCTION: This exam was performed according to the departmental dose-optimization program which includes automated exposure control, adjustment of the mA and/or kV according to patient size and/or use of iterative reconstruction technique. CONTRAST:  OMNIPAQUE  IOHEXOL  300 MG/ML  SOLN COMPARISON:  CT abdomen and pelvis 08/13/2023 FINDINGS: Lower chest: No acute abnormality. Hepatobiliary: The liver is mildly enlarged. There is a stable 8 mm rounded hypodense area in the inferior right lobe of the liver, unchanged. Slightly nodular liver contour present. There is diffuse gallbladder wall edema similar to the prior study. No radiopaque calculi are identified. No biliary ductal dilatation. Pancreas: Unremarkable. No pancreatic ductal dilatation or surrounding inflammatory changes. Spleen: Normal in size without focal abnormality. Adrenals/Urinary Tract: Adrenal glands are unremarkable. Kidneys are normal, without renal calculi, focal lesion, or hydronephrosis. Bladder is unremarkable. Stomach/Bowel: There is wall thickening  and edema of the ascending colon and proximal transverse colon. The appendix is unremarkable. No dilated bowel loops are present. There is also diffuse wall thickening and edema of the gastric antrum with findings worrisome for gastric ulceration along the anterior antrum. Vascular/Lymphatic: Aorta and IVC are normal in size. There are atherosclerotic  calcifications of the aorta. No enlarged lymph nodes are seen. Reproductive: Uterus and bilateral adnexa are unremarkable. Other: There is a small amount of free fluid throughout the abdomen and pelvis which has minimally increased. There is a small amount of free air in the left upper quadrant which is new from prior. Percutaneous drainage catheter is again seen with distal tip adjacent to the gallbladder, unchanged in position. No focal abscess identified. No significant abdominal wall hernia. Musculoskeletal: Mild chronic compression deformity T12 is unchanged. IMPRESSION: 1. New small amount of free air in the left upper quadrant worrisome for bowel perforation. 2. Diffuse wall thickening and edema of the gastric antrum with findings worrisome for gastric ulceration along the anterior antrum. This may be source or free air. 3. Wall thickening and edema of the ascending colon and proximal transverse colon worrisome for colitis. 4. Increasing small amount of free fluid throughout the abdomen and pelvis. 5. Unchanged percutaneous drainage catheter with distal tip adjacent to the gallbladder. No focal abscess identified. 6. Unchanged diffuse gallbladder wall edema. 7. Stable mild hepatomegaly with slightly nodular liver contour. Correlate clinically for cirrhosis. 8. Aortic atherosclerosis. Aortic Atherosclerosis (ICD10-I70.0). Electronically Signed   By: Greig Pique M.D.   On: 08/19/2023 23:53   DG Chest Port 1 View Result Date: 08/19/2023 CLINICAL DATA:  upper abd pain EXAM: PORTABLE CHEST 1 VIEW COMPARISON:  Chest x-ray 08/04/2023 FINDINGS: The heart and mediastinal contours are unchanged. Atherosclerotic plaque. No focal consolidation. No pulmonary edema. No pleural effusion. No pneumothorax. No acute osseous abnormality. IMPRESSION: 1. No active disease. 2.  Aortic Atherosclerosis (ICD10-I70.0). Electronically Signed   By: Morgane  Naveau M.D.   On: 08/19/2023 22:50     Assessment   Joan Wood is  a 53 y.o. year old female with a history of ETOH abuse, cirrhosis felt likely due to ETOH, GERD, HTN, SVT, periumbilical hernia, chronic diarrhea, liver lesion in past with normal AFP, GI bleed in May 2025 with findings of PUD on EGD, gastric perforation July 2025 and underwent exploratory laparotomy with gastrorrhaphy, omental patch, wedge liver biopsy, primary umbilical hernia repair, JP drain placement, chronic anemia, recently discharged on 8/5 s/p laparotomy and returned yesterday to the ED with abdominal pain, nausea, and vomiting. GI has now been consulted due to rectal bleeding.   Rectal bleeding: appears to be likely hemorrhoid or other benign anorectal source in setting of frequent bowel movements. She notes chronic intermittent rectal bleeding and looser stools for the past year. Hgb 10.7 on admission and 9.7 today, which is near her baseline. Suspect some of this drop is hemodilutional. Last increased episode of bleeding was prior to admission, with scant amounts today. Will start Anusol  suppositories. Does not appear to be a rapid transit UGI bleed. Recommend colonoscopy as outpatient.   Chronic diarrhea: for the past year. Does not appear to be worsened from baseline. Likely multifactorial in setting of chronic alcohol use, possible EPI, IBS. Celiac studies negative recently. Would benefit from ileocolonoscopy with random colonic biopsies as outpatient. Doubt infectious component underlying. Cdiff ordered already and can be collected if persistent loose stools. Colitis on CT noted but suspect moreso due to portal colopathy.  Cirrhosis: likely due to ETOH use. MELD 3.0 is 20 today. No varices on recent EGD but did have portal gastropathy. No evidence for HE. Chronic hyponatremia noted. Will monitor for developing ascites but currently without concern for this.   Elevated lipase: mildly elevated at 103 and not diagnostic for pancreatitis. Pancreas normal on CT this admission.      Plan /  Recommendations    Agree with PPI BID Anusol  suppository BID Daily MELD labs Outpatient follow-up of chronic liver lesion Outpatient colonoscopy planned for now Absolute ETOH cessation Avoidance of NSAIDs Stool studies if persistent diarrhea     08/20/2023, 12:52 PM  Therisa MICAEL Stager, PhD, ANP-BC [facility]

## 2023-08-20 NOTE — Progress Notes (Signed)
 Patient asked this writer to ask MD for something to help her sleep. This Clinical research associate sent MD on call a secure message via epic and asked for something to help the patient to sleep per her request. Ativan  0.5mg  IV x1 dose was ordered and given. Patient made aware and tolerated well. Pt has been sleeping since Ativan  was administered. No s/s pain or discomfort noted. This Clinical research associate also discussed with the patient prior to paging MD about the order that was in place for her to have a NG tube placed. Pt refused and got upset stating No one and I mean no one in this place is putting any kind of tube into my nose or my throat. I dont care I will go home if you try to make me. It appears in the previous care providers notes that the patient refused the NG tube with them as well.

## 2023-08-20 NOTE — Progress Notes (Signed)
 [facility]  Called by Dr. Geroldine regarding this patient.  Patient presented to the hospital with worsening abdominal pain, nausea, and vomiting.  She recently underwent exploratory laparotomy, gastrorrhaphy, omental patch, wedge liver biopsy and primary repair of an umbilical hernia on 7/27 for perforated gastric ulcer and liver cirrhosis.  Her JP drain remains in place, and per ED physician, output appears clear and yellow.  Upon evaluation in the emergency department, she is hemodynamically stable.  She is slightly tachycardic at 103.  Her blood work demonstrated no leukocytosis and stable hemoglobin.  She underwent a CT of the abdomen and pelvis which is demonstrating a new small amount of free air in the left upper quadrant, worrisome for bowel perforation with diffuse wall thickening and edema of the gastric antrum with findings worrisome for gastric perforation, and potentially the source of free air.  Patient also had wall thickening and edema of the ascending and proximal transverse colon, worrisome for colitis.  Upon my review of the imaging, she does have a couple of small areas of free air in the left upper quadrant.  Given her imaging findings and location of the air, I suspect this is related to leakage from her recent gastric ulcer perforation repair site.  Recommend admission to the hospitalist given patient's multiple medical comorbidities.  Will plan to place an NG tube, initiate broad-spectrum antibiotics, and will fluid resuscitate overnight.  I will discuss the case with Dr. Kallie tomorrow morning, and we will discuss next steps in management, specifically whether the patient needs additional surgery versus upper GI to evaluate for persistent leak at gastric perforation site.  Ideally, would like to avoid reoperating at this time, as we are 2 weeks out from her last surgery.  Please call with any questions or concerns.  Dorothyann Brittle, DO [facility] [address] [place], [name] [address] [phone] (office)

## 2023-08-20 NOTE — Progress Notes (Signed)
 PROGRESS NOTE  Joan Wood, is a 53 y.o. female, DOB - [birth year], FMW:[id number]  Admit date - 08/19/2023   Admitting Physician Lamar Dess, MD  Outpatient Primary MD for the patient is Zarwolo, Gloria, FNP  LOS - 0  Chief Complaint  Patient presents with   Emesis   Abdominal Pain      Brief Narrative:  53 y.o. female with medical history significant of GERD, hypertension and, recent exploratory laparotomy with omental patch and liver biopsy for gastric ulcer back on 08/05/2023 who was readmitted on 08/20/23 with abdominal pain and concerns for possible free air    -Assessment and Plan: 1) abdominal pain--s/p  exploratory laparotomy with omental patch and liver biopsy for gastric ulcer back on 08/05/2023 -- CT abdomen and pelvis on admission heart failure -Upper Gi study on 08/20/2023 negative for leak. Any air in the leftover quadrant is likely from the drain  as the drain is leaking around the tubing at the skin level. -Currently on IV Zosyn  which may be discontinued -- General Surgery consult appreciated - No leukocytosis  2) history of peptic ulcer disease with recent gastric ulcer perforation --now with possible GI Bleed--bloody stools - GI consult appreciated - Continue Protonix   3)Hemorrhoids--- may explain bloody stools - Give Anusol  HC  4) depressive disorder--continue Zoloft   5) diarrhea--check stool for C. difficile stool for GI pathogen - Consider stopping IV Zosyn  as above  Status is: Inpatient   Disposition: The patient is from: Home              Anticipated d/c is to: Home              Anticipated d/c date is: 3 days              Patient currently is not medically stable to d/c. Barriers: Not Clinically Stable-   Code Status :  -  Code Status: Full Code   Family Communication:   NA (patient is alert, awake and coherent)   DVT Prophylaxis  :   - SCDs  SCDs Start: 08/20/23 0237   Lab Results  Component Value Date   PLT 147 (L) 08/20/2023     Inpatient Medications  Scheduled Meds:  hydrocortisone   25 mg Rectal BID   pantoprazole  (PROTONIX ) IV  40 mg Intravenous Q12H   sertraline   25 mg Oral Daily   Continuous Infusions:  lactated ringers  Stopped (08/20/23 1356)   piperacillin -tazobactam (ZOSYN )  IV 12.5 mL/hr at 08/20/23 1624   PRN Meds:.acetaminophen  **OR** acetaminophen , morphine  injection, ondansetron  **OR** ondansetron  (ZOFRAN ) IV, oxyCODONE    Anti-infectives (From admission, onward)    Start     Dose/Rate Route Frequency Ordered Stop   08/20/23 0315  piperacillin -tazobactam (ZOSYN ) IVPB 3.375 g        3.375 g 12.5 mL/hr over 240 Minutes Intravenous Every 8 hours 08/20/23 0301           Subjective: Joan Wood today has no fevers, no emesis,  No chest pain,    - Bloody stools - Abdominal pain is not worse-   Objective: Vitals:   08/20/23 0500 08/20/23 0748 08/20/23 1213 08/20/23 1419  BP: 122/64 105/63 117/70 132/82  Pulse: 98 95 (!) 108 100  Resp: 18  16 16   Temp: 97.7 F (36.5 C) 98.6 F (37 C) 99.8 F (37.7 C) 99.4 F (37.4 C)  TempSrc: Oral Oral Oral Oral  SpO2: 96% 92% 97% 97%  Weight:      Height:  Intake/Output Summary (Last 24 hours) at 08/20/2023 2002 Last data filed at 08/20/2023 1856 Gross per 24 hour  Intake 392.19 ml  Output 265 ml  Net 127.19 ml   Filed Weights   08/19/23 2213  Weight: 55.3 kg    Physical Exam  Gen:- Awake Alert, no acute distress HEENT:- [place].AT, No sclera icterus Neck-Supple Neck,No JVD,.  Lungs-  CTAB , fair symmetrical air movement CV- S1, S2 normal, regular  Abd-  +ve B.Sounds, Abd Soft, generalized abdominal tenderness, left upper quadrant JP drain noted,    Extremity/Skin:- No  edema, pedal pulses present  Psych-affect is appropriate, oriented x3 Neuro-no new focal deficits, no tremors  Data Reviewed: I have personally reviewed following labs and imaging studies  CBC: Recent Labs  Lab 08/14/23 0949 08/19/23 2219 08/20/23 1504   WBC 7.2 5.6 5.5  NEUTROABS  --  3.4 4.0  HGB 9.0* 10.7* 9.7*  HCT 27.9* 31.9* 29.6*  MCV 88.6 88.1 90.8  PLT 153 330 147*   Basic Metabolic Panel: Recent Labs  Lab 08/14/23 0436 08/19/23 2219 08/20/23 1504  NA 128* 129* 129*  K 3.6 3.5 3.8  CL 92* 90* 93*  CO2 26 24 23   GLUCOSE 85 84 52*  BUN 13 <5* <5*  CREATININE 0.39* 0.37* 0.35*  CALCIUM  8.0* 8.2* 8.0*  MG 1.9 1.7  --    GFR: Estimated Creatinine Clearance: 63.6 mL/min (A) (by C-G formula based on SCr of 0.35 mg/dL (L)). Liver Function Tests: Recent Labs  Lab 08/14/23 0436 08/19/23 2219 08/20/23 1504  AST 79* 102* 103*  ALT 39 44 40  ALKPHOS 92 95 89  BILITOT 1.5* 1.2 1.5*  PROT 5.8* 7.3 6.5  ALBUMIN 1.9* 2.6* 2.3*   Recent Results (from the past 240 hours)  Culture, blood (routine x 2)     Status: None (Preliminary result)   Collection Time: 08/19/23 10:28 PM   Specimen: Right Antecubital; Blood  Result Value Ref Range Status   Specimen Description RIGHT ANTECUBITAL  Final   Special Requests   Final    BOTTLES DRAWN AEROBIC AND ANAEROBIC Blood Culture adequate volume   Culture   Final    NO GROWTH < 12 HOURS Performed at [facility], [address], [place], [name] [address]    Report Status PENDING  Incomplete  Culture, blood (routine x 2)     Status: None (Preliminary result)   Collection Time: 08/19/23 11:08 PM   Specimen: Left Antecubital; Blood  Result Value Ref Range Status   Specimen Description LEFT ANTECUBITAL  Final   Special Requests   Final    BOTTLES DRAWN AEROBIC ONLY Blood Culture adequate volume   Culture   Final    NO GROWTH < 12 HOURS Performed at [facility], [address], [place], [name] [address]    Report Status PENDING  Incomplete  C Difficile Quick Screen w PCR reflex     Status: Abnormal   Collection Time: 08/20/23  2:15 PM   Specimen: STOOL  Result Value Ref Range Status   C Diff antigen POSITIVE (A) NEGATIVE Final   C Diff toxin NEGATIVE NEGATIVE Final   C Diff  interpretation Results are indeterminate. See PCR results.  Final    Comment: Performed at [facility], [address] [place], [name] [address]    Radiology Studies: DG UGI W SINGLE CM (SOL OR THIN BA) Result Date: 08/20/2023 CLINICAL DATA:  Patient with history of perforation of the anterior gastric wall secondary to ulcer  status post repair 08/05/2023. Upper GI series 08/10/2023 negative for contrast extravasation and patient was discharged shortly after. She return to the ED yesterday with complaints of abdominal pain in CT abdomen pelvis showed concern for small amount of free air in the left upper quadrant worrisome for perforation. Request for limited upper GI to rule out postoperative perforation. EXAM: DG UGI W SINGLE CM TECHNIQUE: Scout radiograph was obtained. Single contrast examination was performed using thin liquid barium. This exam was performed by Clotilda Hesselbach, PA-C, and was supervised and interpreted by Ester Sides, MD. FLUOROSCOPY: Radiation Exposure Index (as provided by the fluoroscopic device): 33.10 mGy Kerma COMPARISON:  Upper GI series 08/10/2023, CT abdomen pelvis with contrast 08/19/2023 FINDINGS: Scout Radiograph: Nonspecific bowel gas pattern. Multiple leads overlying the abdomen. Midline surgical staples in place. Surgical drain exiting left upper quadrant with distal tip in the right upper quadrant. Contrast within the urinary bladder. Stomach: Normal appearance. Contrast fills the stomach without difficulty. No evidence of contrast extravasation in the supine AP, right oblique, left oblique, right lateral, left lateral or prone positions. Gastric emptying: Normal. Duodenum:  Normal appearance on limited exam. IMPRESSION: Single-contrast upper GI without evidence of contrast extravasation. Electronically Signed   By: Ester Sides M.D.   On: 08/20/2023 16:07   CT ABDOMEN PELVIS W CONTRAST Result Date: 08/19/2023 CLINICAL DATA:  Acute abdominal pain EXAM: CT ABDOMEN AND  PELVIS WITH CONTRAST TECHNIQUE: Multidetector CT imaging of the abdomen and pelvis was performed using the standard protocol following bolus administration of intravenous contrast. RADIATION DOSE REDUCTION: This exam was performed according to the departmental dose-optimization program which includes automated exposure control, adjustment of the mA and/or kV according to patient size and/or use of iterative reconstruction technique. CONTRAST:  OMNIPAQUE  IOHEXOL  300 MG/ML  SOLN COMPARISON:  CT abdomen and pelvis 08/13/2023 FINDINGS: Lower chest: No acute abnormality. Hepatobiliary: The liver is mildly enlarged. There is a stable 8 mm rounded hypodense area in the inferior right lobe of the liver, unchanged. Slightly nodular liver contour present. There is diffuse gallbladder wall edema similar to the prior study. No radiopaque calculi are identified. No biliary ductal dilatation. Pancreas: Unremarkable. No pancreatic ductal dilatation or surrounding inflammatory changes. Spleen: Normal in size without focal abnormality. Adrenals/Urinary Tract: Adrenal glands are unremarkable. Kidneys are normal, without renal calculi, focal lesion, or hydronephrosis. Bladder is unremarkable. Stomach/Bowel: There is wall thickening and edema of the ascending colon and proximal transverse colon. The appendix is unremarkable. No dilated bowel loops are present. There is also diffuse wall thickening and edema of the gastric antrum with findings worrisome for gastric ulceration along the anterior antrum. Vascular/Lymphatic: Aorta and IVC are normal in size. There are atherosclerotic calcifications of the aorta. No enlarged lymph nodes are seen. Reproductive: Uterus and bilateral adnexa are unremarkable. Other: There is a small amount of free fluid throughout the abdomen and pelvis which has minimally increased. There is a small amount of free air in the left upper quadrant which is new from prior. Percutaneous drainage catheter is  again seen with distal tip adjacent to the gallbladder, unchanged in position. No focal abscess identified. No significant abdominal wall hernia. Musculoskeletal: Mild chronic compression deformity T12 is unchanged. IMPRESSION: 1. New small amount of free air in the left upper quadrant worrisome for bowel perforation. 2. Diffuse wall thickening and edema of the gastric antrum with findings worrisome for gastric ulceration along the anterior antrum. This may be source or free air. 3. Wall thickening and edema of  the ascending colon and proximal transverse colon worrisome for colitis. 4. Increasing small amount of free fluid throughout the abdomen and pelvis. 5. Unchanged percutaneous drainage catheter with distal tip adjacent to the gallbladder. No focal abscess identified. 6. Unchanged diffuse gallbladder wall edema. 7. Stable mild hepatomegaly with slightly nodular liver contour. Correlate clinically for cirrhosis. 8. Aortic atherosclerosis. Aortic Atherosclerosis (ICD10-I70.0). Electronically Signed   By: Greig Pique M.D.   On: 08/19/2023 23:53   DG Chest Port 1 View Result Date: 08/19/2023 CLINICAL DATA:  upper abd pain EXAM: PORTABLE CHEST 1 VIEW COMPARISON:  Chest x-ray 08/04/2023 FINDINGS: The heart and mediastinal contours are unchanged. Atherosclerotic plaque. No focal consolidation. No pulmonary edema. No pleural effusion. No pneumothorax. No acute osseous abnormality. IMPRESSION: 1. No active disease. 2.  Aortic Atherosclerosis (ICD10-I70.0). Electronically Signed   By: Morgane  Naveau M.D.   On: 08/19/2023 22:50   Scheduled Meds:  hydrocortisone   25 mg Rectal BID   pantoprazole  (PROTONIX ) IV  40 mg Intravenous Q12H   sertraline   25 mg Oral Daily   Continuous Infusions:  lactated ringers  Stopped (08/20/23 1356)   piperacillin -tazobactam (ZOSYN )  IV 12.5 mL/hr at 08/20/23 1624    LOS: 0 days   Rendall Carwin M.D on 08/20/2023 at 8:02 PM  Go to [email or web address] - for contact info  [facility] - Office  [phone]  If 7PM-7AM, please contact night-coverage [email or web address] 08/20/2023, 8:02 PM

## 2023-08-20 NOTE — Consult Note (Signed)
 [facility] Consult  Reason for Consult: Concern for free air/ s/p perforated gastric repair, drain in place  Referring Physician: ED   Chief Complaint   Emesis; Abdominal Pain     HPI: Joan Wood is a 53 y.o. female with concern for free air on CT scan after having more abdominal pain. She had a perforated gastric ulcer and Ex lap 7/27 and was kept in the hospital on TPN for 5 days. She then had an UGI that was negative on 08/10/23 and she started having a diet. She did well with the diet but has not had an appetite. She has been had some drainage around the JP drain which has continued to put out serous fluid and has not changed. She does report that this has gone down from the inpatient recordings of 200-400+.  She reports the drainage is 70-100 a day.  Her pain is above her incision and goes in a crescent shape around the incision she reports. She has had a few drinks since being home. Her lipase was over 100.  Past Medical History:  Diagnosis Date   Anxiety    Dysrhythmia    GERD (gastroesophageal reflux disease)    Hernia of abdominal wall    History of hiatal hernia    Sleep apnea    SVT (supraventricular tachycardia) (HCC)     Past Surgical History:  Procedure Laterality Date   ESOPHAGOGASTRODUODENOSCOPY N/A 05/17/2023   Procedure: EGD (ESOPHAGOGASTRODUODENOSCOPY);  Surgeon: Cindie Carlin POUR, DO;  Location: [facility];  Service: Endoscopy;  Laterality: N/A;   FINGER FRACTURE SURGERY     FRACTURE SURGERY     GASTRORRHAPHY N/A 08/05/2023   Procedure: GASTRORRHAPHY;  Surgeon: Kallie Manuelita BROCKS, MD;  Location: [facility];  Service: General;  Laterality: N/A;   LAPAROTOMY N/A 08/05/2023   Procedure: LAPAROTOMY, EXPLORATORY;  Surgeon: Kallie Manuelita BROCKS, MD;  Location: [facility];  Service: General;  Laterality: N/A;   LIVER BIOPSY N/A 08/05/2023   Procedure: BIOPSY, LIVER;  Surgeon: Kallie Manuelita BROCKS, MD;  Location: [facility];  Service: General;  Laterality: N/A;    right ankle repair     age 13   UMBILICAL HERNIA REPAIR N/A 08/05/2023   Procedure: REPAIR, HERNIA, UMBILICAL, ADULT;  Surgeon: Kallie Manuelita BROCKS, MD;  Location: [facility];  Service: General;  Laterality: N/A;    Family History  Problem Relation Age of Onset   Cancer Mother    Leukemia Mother    Multiple myeloma Mother    Cancer Father        unsure what kind   Cancer - Colon Neg Hx    Colon polyps Neg Hx     Social History   Tobacco Use   Smoking status: Every Day    Current packs/day: 0.50    Average packs/day: 0.7 packs/day for 38.4 years (28.5 ttl pk-yrs)    Types: Cigarettes    Start date: 03/2022   Smokeless tobacco: Never  Vaping Use   Vaping status: Never Used  Substance Use Topics   Alcohol use: Yes    Alcohol/week: 2.0 standard drinks of alcohol    Types: 2 Cans of beer per week    Comment: beer,2-3 beers in a weeks time   Drug use: Never    Medications: I have reviewed the patient's current medications. Prior to Admission:  Medications Prior to Admission  Medication Sig Dispense Refill Last Dose/Taking   omeprazole  (PRILOSEC) 40 MG capsule Take 1 capsule (40 mg total) by  mouth 2 (two) times daily. 60 capsule 1 Joan   oxyCODONE  (OXY IR/ROXICODONE ) 5 MG immediate release tablet Take 1 tablet (5 mg total) by mouth every 6 (six) hours as needed for severe pain (pain score 7-10) or breakthrough pain. 25 tablet 0 Past Week   phenol (CHLORASEPTIC) 1.4 % LIQD Use as directed 1 spray in the mouth or throat as needed for throat irritation / pain. 20 mL 0 Past Week   simethicone  (MYLICON) 80 MG chewable tablet Chew 1 tablet (80 mg total) by mouth 4 (four) times daily as needed for flatulence. 30 tablet 0 Joan   bisacodyl  (DULCOLAX) 10 MG suppository Place 1 suppository (10 mg total) rectally daily as needed for moderate constipation. 12 suppository 0    Elastic Bandages & Supports (FITRITE BACK BRACE WITH PULLEY) MISC 1 Units by Does not apply route daily. 1 each 0     methocarbamol  (ROBAXIN ) 500 MG tablet Take 1 tablet (500 mg total) by mouth every 8 (eight) hours as needed for muscle spasms. 15 tablet 0    Misc. Devices MISC Closed toe walking boot for right foot toe fractures Dx: S92.301 1 each 0    sertraline  (ZOLOFT ) 25 MG tablet Take 1 tablet (25 mg total) by mouth daily. 60 tablet 3    sucralfate  (CARAFATE ) 1 g tablet Take 1 tablet (1 g total) by mouth 4 (four) times daily -  with meals and at bedtime. 120 tablet 1    Vitamin D , Ergocalciferol , (DRISDOL ) 1.25 MG (50000 UNIT) CAPS capsule Take 1 capsule (50,000 Units total) by mouth every 7 (seven) days. 25 capsule 1    Scheduled:  pantoprazole  (PROTONIX ) IV  40 mg Intravenous Q24H   sertraline   25 mg Oral Daily   Continuous:  lactated ringers  75 mL/hr at 08/20/23 0316   piperacillin -tazobactam (ZOSYN )  IV 3.375 g (08/20/23 0320)   PRN:acetaminophen  **OR** acetaminophen , morphine  injection, ondansetron  **OR** ondansetron  (ZOFRAN ) IV, oxyCODONE   Allergies  Allergen Reactions   Bee Venom Anaphylaxis    Cannot take epi for this allergy d/t heart condition per pt   Fire Ant Hives and Rash   Latex Rash     ROS:  A comprehensive review of systems was negative except for: Gastrointestinal: positive for abdominal pain  Blood pressure 105/63, pulse 95, temperature 98.6 F (37 C), temperature source Oral, resp. rate 18, height 5' 2 (1.575 m), weight 55.3 kg, SpO2 92%. Physical Exam Vitals reviewed.  HENT:     Head: Normocephalic.  Cardiovascular:     Rate and Rhythm: Normal rate.  Pulmonary:     Effort: Pulmonary effort is normal.  Abdominal:     General: Bowel sounds are absent.     Palpations: Abdomen is soft.     Tenderness: There is abdominal tenderness in the epigastric area.     Comments: Pain around the incision, staples intact, JP with serous fluid, minor irritation but no redness extending out   Skin:    General: Skin is warm.  Neurological:     Mental Status: She is alert.      Results: Results for orders placed or performed during the hospital encounter of 08/19/23 (from the past 48 hours)  CBC with Differential     Status: Abnormal   Collection Time: 08/19/23 10:19 PM  Result Value Ref Range   WBC 5.6 4.0 - 10.5 K/uL   RBC 3.62 (L) 3.87 - 5.11 MIL/uL   Hemoglobin 10.7 (L) 12.0 - 15.0 g/dL   HCT 68.0 (L)  36.0 - 46.0 %   MCV 88.1 80.0 - 100.0 fL   MCH 29.6 26.0 - 34.0 pg   MCHC 33.5 30.0 - 36.0 g/dL   RDW 82.8 (H) 88.4 - 84.4 %   Platelets 330 150 - 400 K/uL   nRBC 0.0 0.0 - 0.2 %   Neutrophils Relative % 61 %   Neutro Abs 3.4 1.7 - 7.7 K/uL   Lymphocytes Relative 22 %   Lymphs Abs 1.2 0.7 - 4.0 K/uL   Monocytes Relative 14 %   Monocytes Absolute 0.8 0.1 - 1.0 K/uL   Eosinophils Relative 1 %   Eosinophils Absolute 0.0 0.0 - 0.5 K/uL   Basophils Relative 1 %   Basophils Absolute 0.1 0.0 - 0.1 K/uL   Immature Granulocytes 1 %   Abs Immature Granulocytes 0.05 0.00 - 0.07 K/uL    Comment: Performed at [facility], [address] [place], [name] [address]  Comprehensive metabolic panel     Status: Abnormal   Collection Time: 08/19/23 10:19 PM  Result Value Ref Range   Sodium 129 (L) 135 - 145 mmol/L   Potassium 3.5 3.5 - 5.1 mmol/L   Chloride 90 (L) 98 - 111 mmol/L   CO2 24 22 - 32 mmol/L   Glucose, Bld 84 70 - 99 mg/dL    Comment: Glucose reference range applies only to samples taken after fasting for at least 8 hours.   BUN <5 (L) 6 - 20 mg/dL   Creatinine, Ser 9.62 (L) 0.44 - 1.00 mg/dL   Calcium  8.2 (L) 8.9 - 10.3 mg/dL   Total Protein 7.3 6.5 - 8.1 g/dL   Albumin 2.6 (L) 3.5 - 5.0 g/dL   AST 897 (H) 15 - 41 U/L   ALT 44 0 - 44 U/L   Alkaline Phosphatase 95 38 - 126 U/L   Total Bilirubin 1.2 0.0 - 1.2 mg/dL   GFR, Estimated >39 >39 mL/min    Comment: (NOTE) Calculated using the CKD-EPI Creatinine Equation (2021)    Anion gap 15 5 - 15    Comment: Performed at [facility], [address], [place], [name] [address]  Magnesium       Status: None   Collection Time: 08/19/23 10:19 PM  Result Value Ref Range   Magnesium  1.7 1.7 - 2.4 mg/dL    Comment: Performed at [facility], [address], [place], [name] [address]  Lactic acid, plasma     Status: None   Collection Time: 08/19/23 10:28 PM  Result Value Ref Range   Lactic Acid, Venous 1.8 0.5 - 1.9 mmol/L    Comment: Performed at [facility], [address] [place], [name] [address]  Culture, blood (routine x 2)     Status: None (Preliminary result)   Collection Time: 08/19/23 10:28 PM   Specimen: Right Antecubital; Blood  Result Value Ref Range   Specimen Description RIGHT ANTECUBITAL    Special Requests      BOTTLES DRAWN AEROBIC AND ANAEROBIC Blood Culture adequate volume   Culture      NO GROWTH < 12 HOURS Performed at [facility], [address] [place], [name] [address]    Report Status PENDING   Urinalysis, Routine w reflex microscopic -Urine, Clean Catch     Status: Abnormal   Collection Time: 08/19/23 10:40 PM  Result Value Ref Range   Color, Urine YELLOW YELLOW   APPearance CLEAR CLEAR   Specific Gravity, Urine 1.002 (L) 1.005 - 1.030   pH 6.0 5.0 -  8.0   Glucose, UA NEGATIVE NEGATIVE mg/dL   Hgb urine dipstick SMALL (A) NEGATIVE   Bilirubin Urine NEGATIVE NEGATIVE   Ketones, ur 5 (A) NEGATIVE mg/dL   Protein, ur NEGATIVE NEGATIVE mg/dL   Nitrite NEGATIVE NEGATIVE   Leukocytes,Ua NEGATIVE NEGATIVE   RBC / HPF 0-5 0 - 5 RBC/hpf   WBC, UA 0-5 0 - 5 WBC/hpf   Bacteria, UA RARE (A) NONE SEEN   Squamous Epithelial / HPF 0-5 0 - 5 /HPF    Comment: Performed at [facility], [address] [place], [name] [address]  Lipase, blood     Status: Abnormal   Collection Time: 08/19/23 11:08 PM  Result Value Ref Range   Lipase 103 (H) 11 - 51 U/L    Comment: Performed at [facility], [address], [place], [name] [address]  Culture, blood (routine x 2)     Status: None (Preliminary result)   Collection Time: 08/19/23 11:08 PM   Specimen: Left  Antecubital; Blood  Result Value Ref Range   Specimen Description LEFT ANTECUBITAL    Special Requests      BOTTLES DRAWN AEROBIC ONLY Blood Culture adequate volume   Culture      NO GROWTH < 12 HOURS Performed at [facility], [address] [place], [name] [address]    Report Status PENDING    Personally reviewed- jp at the edge of the muscle, some air in the left upper quadrant, scant  CT ABDOMEN PELVIS W CONTRAST Result Date: 08/19/2023 CLINICAL DATA:  Acute abdominal pain EXAM: CT ABDOMEN AND PELVIS WITH CONTRAST TECHNIQUE: Multidetector CT imaging of the abdomen and pelvis was performed using the standard protocol following bolus administration of intravenous contrast. RADIATION DOSE REDUCTION: This exam was performed according to the departmental dose-optimization program which includes automated exposure control, adjustment of the mA and/or kV according to patient size and/or use of iterative reconstruction technique. CONTRAST:  OMNIPAQUE  IOHEXOL  300 MG/ML  SOLN COMPARISON:  CT abdomen and pelvis 08/13/2023 FINDINGS: Lower chest: No acute abnormality. Hepatobiliary: The liver is mildly enlarged. There is a stable 8 mm rounded hypodense area in the inferior right lobe of the liver, unchanged. Slightly nodular liver contour present. There is diffuse gallbladder wall edema similar to the prior study. No radiopaque calculi are identified. No biliary ductal dilatation. Pancreas: Unremarkable. No pancreatic ductal dilatation or surrounding inflammatory changes. Spleen: Normal in size without focal abnormality. Adrenals/Urinary Tract: Adrenal glands are unremarkable. Kidneys are normal, without renal calculi, focal lesion, or hydronephrosis. Bladder is unremarkable. Stomach/Bowel: There is wall thickening and edema of the ascending colon and proximal transverse colon. The appendix is unremarkable. No dilated bowel loops are present. There is also diffuse wall thickening and edema of the gastric  antrum with findings worrisome for gastric ulceration along the anterior antrum. Vascular/Lymphatic: Aorta and IVC are normal in size. There are atherosclerotic calcifications of the aorta. No enlarged lymph nodes are seen. Reproductive: Uterus and bilateral adnexa are unremarkable. Other: There is a small amount of free fluid throughout the abdomen and pelvis which has minimally increased. There is a small amount of free air in the left upper quadrant which is new from prior. Percutaneous drainage catheter is again seen with distal tip adjacent to the gallbladder, unchanged in position. No focal abscess identified. No significant abdominal wall hernia. Musculoskeletal: Mild chronic compression deformity T12 is unchanged. IMPRESSION: 1. New small amount of free air in the left upper quadrant worrisome for bowel perforation. 2. Diffuse  wall thickening and edema of the gastric antrum with findings worrisome for gastric ulceration along the anterior antrum. This may be source or free air. 3. Wall thickening and edema of the ascending colon and proximal transverse colon worrisome for colitis. 4. Increasing small amount of free fluid throughout the abdomen and pelvis. 5. Unchanged percutaneous drainage catheter with distal tip adjacent to the gallbladder. No focal abscess identified. 6. Unchanged diffuse gallbladder wall edema. 7. Stable mild hepatomegaly with slightly nodular liver contour. Correlate clinically for cirrhosis. 8. Aortic atherosclerosis. Aortic Atherosclerosis (ICD10-I70.0). Electronically Signed   By: Greig Pique M.D.   On: 08/19/2023 23:53   DG Chest Port 1 View Result Date: 08/19/2023 CLINICAL DATA:  upper abd pain EXAM: PORTABLE CHEST 1 VIEW COMPARISON:  Chest x-ray 08/04/2023 FINDINGS: The heart and mediastinal contours are unchanged. Atherosclerotic plaque. No focal consolidation. No pulmonary edema. No pleural effusion. No pneumothorax. No acute osseous abnormality. IMPRESSION: 1. No active  disease. 2.  Aortic Atherosclerosis (ICD10-I70.0). Electronically Signed   By: Morgane  Naveau M.D.   On: 08/19/2023 22:50     Assessment & Plan:  MORNINGSTAR TOFT is a 53 y.o. female with some scant free air in the left upper quadrant. This is far from the site of perforation and closer to the JP drain which does have the white part partially in the muscle wall. She has been leaking fluid around the JP drain too. JP fluid remains serous. She has no leukocytosis and otherwise is stable . I do not think she has another perforation. Will get an UGI to prove that the leak is sealed. Will hopefully get JP out in the next few days with the output < 100. She will likely need paracentesis at some point to help with control of fluid if that continues as it could be ascites from her cirrhosis.   NPO until the UGI done PPI BID for perforated gastric ulcer NO NSAIDS   All questions were answered to the satisfaction of the patient.   Manuelita JAYSON Pander 08/20/2023, 10:23 AM

## 2023-08-20 NOTE — Progress Notes (Signed)
 Patient arrived to this unit and was admitted to room 323. Pt is alert, verbal oriented x4 with forgetfulness at times. Pt is anxious whenever getting up and states she cannot hardly ambulate. Pt was able to get herself up from the stretcher with x1 assist via staff and ambulate to the bed in her room and sit down on bed. Pt denies pain at this time and states that the pain medicine they gave me in the ED is still working good for me. Pt has a midline surgical incision noted to her abdomen with 18 staples approximating wound edges for  closure. Pt also has a JP drain noted in her LLQ of her abdomen that has a very mild amount of clear fluid leaking from the insertion site as well as having 50cc of clear/yellow fluid noted in bulb of drain. Pt denies any nausea or abdominal discomfort at this time.Pt is aware of her NPO status and understands that she can only have sips and chips for taking meds. Oral care at bedside so pt is able to perform oral care due to dryness to her mouth. Pt also stated to this writer that she had falls 2 weeks ago at home and she hit her L shoulder and showed this Clinical research associate and charge nurse a wound to the top of her L shoulder which consists of a round scabbed over abrasion with redness peri wound. Pt is able to move all extremities without any difficulty however. Pt was educated to splint with a pillow when coughing or sneezing and not to be bending at the waist due to surgical incision. Pt understands. Call light within reach and bed alarm on and active.

## 2023-08-20 NOTE — Plan of Care (Signed)
  Problem: Education: Goal: Knowledge of General Education information will improve Description: Including pain rating scale, medication(s)/side effects and non-pharmacologic comfort measures Outcome: Progressing   Problem: Health Behavior/Discharge Planning: Goal: Ability to manage health-related needs will improve Outcome: Progressing   Problem: Clinical Measurements: Goal: Ability to maintain clinical measurements within normal limits will improve Outcome: Progressing Goal: Will remain free from infection Outcome: Progressing Goal: Diagnostic test results will improve Outcome: Progressing Goal: Respiratory complications will improve Outcome: Progressing Goal: Cardiovascular complication will be avoided Outcome: Progressing   Problem: Activity: Goal: Risk for activity intolerance will decrease Outcome: Progressing   Problem: Nutrition: Goal: Adequate nutrition will be maintained Outcome: Progressing   Problem: Coping: Goal: Level of anxiety will decrease Outcome: Progressing   Problem: Elimination: Goal: Will not experience complications related to bowel motility Outcome: Progressing Goal: Will not experience complications related to urinary retention Outcome: Progressing   Problem: Pain Managment: Goal: General experience of comfort will improve and/or be controlled Outcome: Progressing   Problem: Safety: Goal: Ability to remain free from injury will improve Outcome: Progressing   Problem: Skin Integrity: Goal: Risk for impaired skin integrity will decrease Outcome: Progressing   Problem: Anxiety Goal: LTG: Joan Wood will score less than 5 on the Generalized Anxiety Disorder 7 Scale (GAD-7)  Outcome: Progressing Goal: STG: Joan Wood will attend at least 80% of scheduled IOP sessions  Outcome: Progressing Goal: STG: Joan Wood will attend at least 80% of scheduled PHP sessions    Outcome: Progressing Goal: STG: Joan Wood will attend at least 80% of scheduled group psychotherapy  sessions  Outcome: Progressing Goal: STG: Joan Wood will participate in at least 80% of scheduled individual psychotherapy sessions  Outcome: Progressing Goal: STG: Joan Wood will attend at least 80% of scheduled family sessions  Outcome: Progressing Goal: STG: Joan Wood will complete at least 80% of assigned homework  Outcome: Progressing Goal: STG: Joan Wood will attend couples/family counseling sessions with partner/family member 1x per week for the next 4 weeks.  Outcome: Progressing Goal: STG: Joan Wood will practice problem solving skills 3 times per week for the next 4 weeks.  Outcome: Progressing Goal: STG: Report a decrease in anxiety symptoms as evidenced by an overall reduction in anxiety score by a minimum of 25% on the Generalized Anxiety Disorder Scale (GAD-7) Outcome: Progressing Goal: STG: Joan Wood will reduce frequency of avoidant behaviors by 50% as evidenced by self-report in therapy sessions Outcome: Progressing

## 2023-08-20 NOTE — ED Notes (Signed)
 ED TO INPATIENT HANDOFF REPORT  ED Nurse Name and Phone #:   S Name/Age/Gender Joan Wood 53 y.o. female Room/Bed: APA18/APA18  Code Status   Code Status: Prior  Home/SNF/Other Home Patient oriented to: self, place, time, and situation Is this baseline? Yes   Triage Complete: Triage complete  Chief Complaint Abdominal pain [R10.9]  Triage Note Pt comes from home by EMS for n/v/d and abd. Pain. Pt hadhad surgery about 10 days ago here and a drain was placed. Upper quad pain. Pt states her vomit is yellow and her stool is very loose and black. A&Ox4.  Per EMS stable VS and did not give anything.    Allergies Allergies  Allergen Reactions   Bee Venom Anaphylaxis    Cannot take epi for this allergy d/t heart condition per pt   Fire Ant Hives and Rash   Latex Rash    Level of Care/Admitting Diagnosis ED Disposition     ED Disposition  Admit   Condition  --   Comment  Hospital Area: [facility] [[id number]]  Level of Care: Telemetry [5]  Covid Evaluation: Asymptomatic - no recent exposure (last 10 days) testing not required  Diagnosis: Abdominal pain [744753]  Admitting Physician: DENA CHARLESTON [[id number]]  Attending Physician: DENA CHARLESTON [[id number]]  Certification:: I certify this patient will need inpatient services for at least 2 midnights  Expected Medical Readiness: 08/22/2023          B Medical/Surgery History Past Medical History:  Diagnosis Date   Anxiety    GERD (gastroesophageal reflux disease)    Hernia of abdominal wall    HTN (hypertension)    SVT (supraventricular tachycardia) (HCC)    Past Surgical History:  Procedure Laterality Date   ESOPHAGOGASTRODUODENOSCOPY N/A 05/17/2023   Procedure: EGD (ESOPHAGOGASTRODUODENOSCOPY);  Surgeon: Cindie Carlin POUR, DO;  [facility];  Service: Endoscopy;  Laterality: N/A;   FINGER FRACTURE SURGERY     GASTRORRHAPHY N/A 08/05/2023   Procedure: GASTRORRHAPHY;  Surgeon: Kallie Manuelita BROCKS, MD;  Location: [facility] ORS;  Service: General;  Laterality: N/A;   LAPAROTOMY N/A 08/05/2023   Procedure: LAPAROTOMY, EXPLORATORY;  Surgeon: Kallie Manuelita BROCKS, MD;  Location: [facility];  Service: General;  Laterality: N/A;   LIVER BIOPSY N/A 08/05/2023   Procedure: BIOPSY, LIVER;  Surgeon: Kallie Manuelita BROCKS, MD;  Location: [facility];  Service: General;  Laterality: N/A;   right ankle repair     age 22   UMBILICAL HERNIA REPAIR N/A 08/05/2023   Procedure: REPAIR, HERNIA, UMBILICAL, ADULT;  Surgeon: Kallie Manuelita BROCKS, MD;  Location: [facility];  Service: General;  Laterality: N/A;     A IV Location/Drains/Wounds Patient Lines/Drains/Airways Status     Active Line/Drains/Airways     Name Placement date Placement time Site Days   Peripheral IV 08/19/23 20 G 1 Right Antecubital 08/19/23  2223  Antecubital  1   Open Drain 1 Right Abdomen 10 Fr. 08/05/23  0433  Abdomen  15   Wound 08/05/23 0459 Surgical Closed Surgical Incision Abdomen 08/05/23  0459  Abdomen  15            Intake/Output Last 24 hours No intake or output data in the 24 hours ending 08/20/23 0130  Labs/Imaging Results for orders placed or performed during the hospital encounter of 08/19/23 (from the past 48 hours)  CBC with Differential     Status: Abnormal   Collection Time: 08/19/23 10:19 PM  Result Value Ref Range   WBC  5.6 4.0 - 10.5 K/uL   RBC 3.62 (L) 3.87 - 5.11 MIL/uL   Hemoglobin 10.7 (L) 12.0 - 15.0 g/dL   HCT 68.0 (L) 63.9 - 53.9 %   MCV 88.1 80.0 - 100.0 fL   MCH 29.6 26.0 - 34.0 pg   MCHC 33.5 30.0 - 36.0 g/dL   RDW 82.8 (H) 88.4 - 84.4 %   Platelets 330 150 - 400 K/uL   nRBC 0.0 0.0 - 0.2 %   Neutrophils Relative % 61 %   Neutro Abs 3.4 1.7 - 7.7 K/uL   Lymphocytes Relative 22 %   Lymphs Abs 1.2 0.7 - 4.0 K/uL   Monocytes Relative 14 %   Monocytes Absolute 0.8 0.1 - 1.0 K/uL   Eosinophils Relative 1 %   Eosinophils Absolute 0.0 0.0 - 0.5 K/uL   Basophils Relative 1 %   Basophils Absolute 0.1  0.0 - 0.1 K/uL   Immature Granulocytes 1 %   Abs Immature Granulocytes 0.05 0.00 - 0.07 K/uL    Comment: Performed at [facility], [address] [place], [name] [address]  Comprehensive metabolic panel     Status: Abnormal   Collection Time: 08/19/23 10:19 PM  Result Value Ref Range   Sodium 129 (L) 135 - 145 mmol/L   Potassium 3.5 3.5 - 5.1 mmol/L   Chloride 90 (L) 98 - 111 mmol/L   CO2 24 22 - 32 mmol/L   Glucose, Bld 84 70 - 99 mg/dL    Comment: Glucose reference range applies only to samples taken after fasting for at least 8 hours.   BUN <5 (L) 6 - 20 mg/dL   Creatinine, Ser 9.62 (L) 0.44 - 1.00 mg/dL   Calcium  8.2 (L) 8.9 - 10.3 mg/dL   Total Protein 7.3 6.5 - 8.1 g/dL   Albumin 2.6 (L) 3.5 - 5.0 g/dL   AST 897 (H) 15 - 41 U/L   ALT 44 0 - 44 U/L   Alkaline Phosphatase 95 38 - 126 U/L   Total Bilirubin 1.2 0.0 - 1.2 mg/dL   GFR, Estimated >39 >39 mL/min    Comment: (NOTE) Calculated using the CKD-EPI Creatinine Equation (2021)    Anion gap 15 5 - 15    Comment: Performed at [facility], [address] [place], [name] [address]  Magnesium      Status: None   Collection Time: 08/19/23 10:19 PM  Result Value Ref Range   Magnesium  1.7 1.7 - 2.4 mg/dL    Comment: Performed at [facility], [address], [place], [name] [address]  Lactic acid, plasma     Status: None   Collection Time: 08/19/23 10:28 PM  Result Value Ref Range   Lactic Acid, Venous 1.8 0.5 - 1.9 mmol/L    Comment: Performed at [facility], [address], [place], [name] [address]  Urinalysis, Routine w reflex microscopic -Urine, Clean Catch     Status: Abnormal   Collection Time: 08/19/23 10:40 PM  Result Value Ref Range   Color, Urine YELLOW YELLOW   APPearance CLEAR CLEAR   Specific Gravity, Urine 1.002 (L) 1.005 - 1.030   pH 6.0 5.0 - 8.0   Glucose, UA NEGATIVE NEGATIVE mg/dL   Hgb urine dipstick SMALL (A) NEGATIVE   Bilirubin Urine NEGATIVE NEGATIVE   Ketones, ur 5 (A) NEGATIVE mg/dL    Protein, ur NEGATIVE NEGATIVE mg/dL   Nitrite NEGATIVE NEGATIVE   Leukocytes,Ua NEGATIVE NEGATIVE   RBC / HPF 0-5 0 - 5 RBC/hpf  WBC, UA 0-5 0 - 5 WBC/hpf   Bacteria, UA RARE (A) NONE SEEN   Squamous Epithelial / HPF 0-5 0 - 5 /HPF    Comment: Performed at [facility], [address], [place], [name] [address]  Lipase, blood     Status: Abnormal   Collection Time: 08/19/23 11:08 PM  Result Value Ref Range   Lipase 103 (H) 11 - 51 U/L    Comment: Performed at [facility], [address], [place], [name] [address]  Culture, blood (routine x 2)     Status: None (Preliminary result)   Collection Time: 08/19/23 11:08 PM   Specimen: Left Antecubital; Blood  Result Value Ref Range   Specimen Description LEFT ANTECUBITAL    Special Requests      BOTTLES DRAWN AEROBIC ONLY Blood Culture adequate volume Performed at [facility], [address] [place], [name] [address]    Culture PENDING    Report Status PENDING    CT ABDOMEN PELVIS W CONTRAST Result Date: 08/19/2023 CLINICAL DATA:  Acute abdominal pain EXAM: CT ABDOMEN AND PELVIS WITH CONTRAST TECHNIQUE: Multidetector CT imaging of the abdomen and pelvis was performed using the standard protocol following bolus administration of intravenous contrast. RADIATION DOSE REDUCTION: This exam was performed according to the departmental dose-optimization program which includes automated exposure control, adjustment of the mA and/or kV according to patient size and/or use of iterative reconstruction technique. CONTRAST:  OMNIPAQUE  IOHEXOL  300 MG/ML  SOLN COMPARISON:  CT abdomen and pelvis 08/13/2023 FINDINGS: Lower chest: No acute abnormality. Hepatobiliary: The liver is mildly enlarged. There is a stable 8 mm rounded hypodense area in the inferior right lobe of the liver, unchanged. Slightly nodular liver contour present. There is diffuse gallbladder wall edema similar to the prior study. No radiopaque calculi are identified. No biliary ductal  dilatation. Pancreas: Unremarkable. No pancreatic ductal dilatation or surrounding inflammatory changes. Spleen: Normal in size without focal abnormality. Adrenals/Urinary Tract: Adrenal glands are unremarkable. Kidneys are normal, without renal calculi, focal lesion, or hydronephrosis. Bladder is unremarkable. Stomach/Bowel: There is wall thickening and edema of the ascending colon and proximal transverse colon. The appendix is unremarkable. No dilated bowel loops are present. There is also diffuse wall thickening and edema of the gastric antrum with findings worrisome for gastric ulceration along the anterior antrum. Vascular/Lymphatic: Aorta and IVC are normal in size. There are atherosclerotic calcifications of the aorta. No enlarged lymph nodes are seen. Reproductive: Uterus and bilateral adnexa are unremarkable. Other: There is a small amount of free fluid throughout the abdomen and pelvis which has minimally increased. There is a small amount of free air in the left upper quadrant which is new from prior. Percutaneous drainage catheter is again seen with distal tip adjacent to the gallbladder, unchanged in position. No focal abscess identified. No significant abdominal wall hernia. Musculoskeletal: Mild chronic compression deformity T12 is unchanged. IMPRESSION: 1. New small amount of free air in the left upper quadrant worrisome for bowel perforation. 2. Diffuse wall thickening and edema of the gastric antrum with findings worrisome for gastric ulceration along the anterior antrum. This may be source or free air. 3. Wall thickening and edema of the ascending colon and proximal transverse colon worrisome for colitis. 4. Increasing small amount of free fluid throughout the abdomen and pelvis. 5. Unchanged percutaneous drainage catheter with distal tip adjacent to the gallbladder. No focal abscess identified. 6. Unchanged diffuse gallbladder wall edema. 7. Stable mild hepatomegaly with slightly nodular liver  contour. Correlate  clinically for cirrhosis. 8. Aortic atherosclerosis. Aortic Atherosclerosis (ICD10-I70.0). Electronically Signed   By: Greig Pique M.D.   On: 08/19/2023 23:53   DG Chest Port 1 View Result Date: 08/19/2023 CLINICAL DATA:  upper abd pain EXAM: PORTABLE CHEST 1 VIEW COMPARISON:  Chest x-ray 08/04/2023 FINDINGS: The heart and mediastinal contours are unchanged. Atherosclerotic plaque. No focal consolidation. No pulmonary edema. No pleural effusion. No pneumothorax. No acute osseous abnormality. IMPRESSION: 1. No active disease. 2.  Aortic Atherosclerosis (ICD10-I70.0). Electronically Signed   By: Morgane  Naveau M.D.   On: 08/19/2023 22:50    Pending Labs Unresulted Labs (From admission, onward)     Start     Ordered   08/19/23 2225  Culture, blood (routine x 2)  BLOOD CULTURE X 2,   R (with STAT occurrences)      08/19/23 2224            Vitals/Pain Today's Vitals   08/19/23 2343 08/20/23 0045 08/20/23 0114 08/20/23 0117  BP:   (!) 95/57   Pulse:      Resp:   15   Temp:      TempSrc:      SpO2:      Weight:      Height:      PainSc: 10-Worst pain ever 8   2     Isolation Precautions No active isolations  Medications Medications  ondansetron  (ZOFRAN ) injection 4 mg (4 mg Intravenous Given 08/19/23 2342)  sodium chloride  0.9 % bolus 1,000 mL (1,000 mLs Intravenous New Bag/Given 08/19/23 2342)  morphine  (PF) 4 MG/ML injection 4 mg (4 mg Intravenous Given 08/19/23 2343)  iohexol  (OMNIPAQUE ) 300 MG/ML solution 100 mL (100 mLs Intravenous Contrast Given 08/19/23 2329)  morphine  (PF) 4 MG/ML injection 4 mg (4 mg Intravenous Given 08/20/23 0045)  ondansetron  (ZOFRAN ) injection 4 mg (4 mg Intravenous Given 08/20/23 0056)    Mobility walks     Focused Assessments    R Recommendations: See Admitting Provider Note  Report given to:   Additional Notes:

## 2023-08-20 NOTE — TOC Initial Note (Signed)
 Transition of Care [facility]) - Initial/Assessment Note    Patient Details  Name: Joan Wood MRN: [id number] Date of Birth: [birth year]  Transition of [facility]) CM/SW Contact:    Mcarthur Saddie Kim, LCSW Phone Number: 08/20/2023, 8:23 AM  Clinical Narrative: Pt admitted with abdominal pain. Assessment completed due to high risk readmission score. Pt recently d/c from hospital. Pt reports she lives with a roommate. She is independent with ADLs and ambulates using a cane or walker. No home health services prior to admission. Pt plans to return home when medically stable. TOC will follow.                   Expected Discharge Plan: Home/Self Care     Patient Goals and CMS Choice Patient states their goals for this hospitalization and ongoing recovery are:: return home   Choice offered to / list presented to : Patient [place] ownership interest in [facility].provided to::  (n/a)    Expected Discharge Plan and Services In-house Referral: Clinical Social Work     Living arrangements for the past 2 months: Single Family Home                                      Prior Living Arrangements/Services Living arrangements for the past 2 months: Single Family Home Lives with:: Roommate Patient language and need for interpreter reviewed:: Yes Do you feel safe going back to the place where you live?: Yes      Need for Family Participation in Patient Care: No (Comment)   Current home services: DME (cane, walker, wheelchair) Criminal Activity/Legal Involvement Pertinent to Current Situation/Hospitalization: No - Comment as needed  Activities of Daily Living   ADL Screening (condition at time of admission) Independently performs ADLs?: Yes (appropriate for developmental age) Is the patient deaf or have difficulty hearing?: No Does the patient have difficulty seeing, even when wearing glasses/contacts?: Yes Does the patient have difficulty concentrating,  remembering, or making decisions?: No  Permission Sought/Granted                  Emotional Assessment     Affect (typically observed): Appropriate Orientation: : Oriented to Self, Oriented to Place, Oriented to  Time, Oriented to Situation Alcohol / Substance Use: Not Applicable Psych Involvement: No (comment)  Admission diagnosis:  Intra-abdominal free air of unknown etiology [K66.8] History of gastric ulcer [Z87.11] Abdominal pain [R10.9] Patient Active Problem List   Diagnosis Date Noted   Abdominal pain 08/20/2023   Liver lesion 08/14/2023   Malnutrition of moderate degree 08/08/2023   ABLA (acute blood loss anemia) 08/08/2023   Pneumoperitoneum 08/05/2023   Perforated abdominal viscus 08/05/2023   Syncope 07/30/2023   Depression, recurrent (HCC) 07/30/2023   Generalized abdominal pain 06/04/2023   Nausea vomiting and diarrhea 06/04/2023   Enteritis 05/28/2023   Intractable vomiting 05/28/2023   Perforated ulcer (HCC) 05/27/2023   Alcoholic cirrhosis (HCC) 05/27/2023   Hypomagnesemia 05/17/2023   Hyponatremia 05/15/2023   Tobacco abuse 05/15/2023   Acute hyponatremia 05/11/2023   Pancreatitis 05/11/2023   Gastritis 05/11/2023   Hypokalemia 05/11/2023   Multiple closed fractures of metatarsal bone with routine healing, right 04/24/2023   Neuropathy 03/31/2023   Irritable bowel syndrome with diarrhea 03/31/2023   GERD (gastroesophageal reflux disease) 03/31/2023   Nausea and vomiting 03/31/2023   Alcohol use disorder 03/28/2022   Left hip pain 03/28/2022  Incarcerated umbilical hernia 03/28/2022   Adjustment disorder with mixed anxiety and depressed mood 12/15/2014   Malingering 12/14/2014   PCP:  Zarwolo, Gloria, FNP Pharmacy:   [facility] [phone] - [place], [place] - [address] AT [facility] [address] [place] [address] Phone: [phone] Fax: [phone]     Social Drivers of Health (SDOH) Social History: SDOH Screenings    Food Insecurity: No Food Insecurity (08/20/2023)  Housing: Low Risk  (08/20/2023)  Transportation Needs: Unmet Transportation Needs (08/20/2023)  Utilities: Not At Risk (08/20/2023)  Depression (PHQ2-9): High Risk (07/30/2023)  Social Connections: Socially Isolated (08/20/2023)  Tobacco Use: High Risk (08/20/2023)   SDOH Interventions:     Readmission Risk Interventions    08/20/2023    8:21 AM 08/11/2023    9:36 AM 08/10/2023   10:02 AM  Readmission Risk Prevention Plan  Transportation Screening Complete Complete Complete  HRI or Home Care Consult Complete Complete Complete  Social Work Consult for Recovery Care Planning/Counseling Complete Complete Complete  Palliative Care Screening Not Applicable Not Applicable Not Applicable  Medication Review Oceanographer) Complete Complete Complete

## 2023-08-20 NOTE — H&P (Signed)
 History and Physical    Joan Wood FMW:[id number] DOB: 1970-05-26 DOA: 08/19/2023  PCP: Zarwolo, Gloria, FNP   Chief Complaint: Abdominal pain  HPI: Joan Wood is a 53 y.o. female with medical history significant of GERD, hypertension and, recent exploratory laparotomy with omental patch and liver biopsy for gastric ulcer who presented to the emergency department due to severe abdominal pain.  Patient has current drain in place and developed acute onset worsening pain and nausea as well as several episodes of vomiting.  She was brought to the ER where she was found to be afebrile and hemodynamically stable.  Labs were obtained which showed WBC 5.6, hemoglobin 10.7, CMP unrevealing, lactic acid 1.8, urinalysis negative for infection.  Patient underwent CT abdomen pelvis which showed free air in left upper quadrant concerning for bowel perforation as well as gastric ulceration and colitis.  Surgery was consulted and recommended admission as well as antibiotics.  Patient will be discussed with surgical team tomorrow.  Patient was endorsing persistent pain that improved with IV narcotics.   Review of Systems: Review of Systems  Constitutional:  Negative for chills and fever.  HENT: Negative.    Eyes: Negative.   Respiratory: Negative.    Cardiovascular: Negative.   Gastrointestinal:  Positive for abdominal pain, nausea and vomiting.  Genitourinary: Negative.   Musculoskeletal: Negative.   Skin: Negative.   Neurological: Negative.   Endo/Heme/Allergies: Negative.   Psychiatric/Behavioral: Negative.       As per HPI otherwise 10 point review of systems negative.   Allergies  Allergen Reactions   Bee Venom Anaphylaxis    Cannot take epi for this allergy d/t heart condition per pt   Fire Ant Hives and Rash   Latex Rash    Past Medical History:  Diagnosis Date   Anxiety    GERD (gastroesophageal reflux disease)    Hernia of abdominal wall    HTN (hypertension)    SVT  (supraventricular tachycardia) (HCC)     Past Surgical History:  Procedure Laterality Date   ESOPHAGOGASTRODUODENOSCOPY N/A 05/17/2023   Procedure: EGD (ESOPHAGOGASTRODUODENOSCOPY);  Surgeon: Cindie Carlin POUR, DO;  Location: [facility];  Service: Endoscopy;  Laterality: N/A;   FINGER FRACTURE SURGERY     GASTRORRHAPHY N/A 08/05/2023   Procedure: GASTRORRHAPHY;  Surgeon: Kallie Manuelita BROCKS, MD;  Location: [facility];  Service: General;  Laterality: N/A;   LAPAROTOMY N/A 08/05/2023   Procedure: LAPAROTOMY, EXPLORATORY;  Surgeon: Kallie Manuelita BROCKS, MD;  Location: [facility];  Service: General;  Laterality: N/A;   LIVER BIOPSY N/A 08/05/2023   Procedure: BIOPSY, LIVER;  Surgeon: Kallie Manuelita BROCKS, MD;  Location: [facility];  Service: General;  Laterality: N/A;   right ankle repair     age 21   UMBILICAL HERNIA REPAIR N/A 08/05/2023   Procedure: REPAIR, HERNIA, UMBILICAL, ADULT;  Surgeon: Kallie Manuelita BROCKS, MD;  Location: [facility];  Service: General;  Laterality: N/A;     reports that she has been smoking cigarettes. She started smoking about 17 months ago. She has a 28.5 pack-year smoking history. She has never used smokeless tobacco. She reports current alcohol use. She reports that she does not use drugs.  Family History  Problem Relation Age of Onset   Cancer Mother    Leukemia Mother    Multiple myeloma Mother    Cancer Father        unsure what kind   Cancer - Colon Neg Hx    Colon polyps Neg Hx  Prior to Admission medications   Medication Sig Start Date End Date Taking? Authorizing Provider  bisacodyl  (DULCOLAX) 10 MG suppository Place 1 suppository (10 mg total) rectally daily as needed for moderate constipation. 08/14/23   Vicci Afton CROME, MD  Elastic Bandages & Supports (FITRITE BACK BRACE WITH PULLEY) MISC 1 Units by Does not apply route daily. 05/25/23   Bevely Doffing, FNP  folic acid  (FOLVITE ) 1 MG tablet Take 1 tablet (1 mg total) by mouth daily. 03/29/23   Zarwolo, Gloria, FNP   methocarbamol  (ROBAXIN ) 500 MG tablet Take 1 tablet (500 mg total) by mouth every 8 (eight) hours as needed for muscle spasms. 08/14/23   Vicci Afton CROME, MD  Misc. Devices MISC Closed toe walking boot for right foot toe fractures Dx: D07.698 05/25/23   Bevely Doffing, FNP  omeprazole  (PRILOSEC) 40 MG capsule Take 1 capsule (40 mg total) by mouth 2 (two) times daily. 08/14/23   Mcmackin, Clanford L, MD  oxyCODONE  (OXY IR/ROXICODONE ) 5 MG immediate release tablet Take 1 tablet (5 mg total) by mouth every 6 (six) hours as needed for severe pain (pain score 7-10) or breakthrough pain. 08/14/23   Smullen, Clanford L, MD  phenol (CHLORASEPTIC) 1.4 % LIQD Use as directed 1 spray in the mouth or throat as needed for throat irritation / pain. 08/14/23   Devall, Clanford L, MD  sertraline  (ZOLOFT ) 25 MG tablet Take 1 tablet (25 mg total) by mouth daily. 07/30/23   Zarwolo, Gloria, FNP  simethicone  (MYLICON) 80 MG chewable tablet Chew 1 tablet (80 mg total) by mouth 4 (four) times daily as needed for flatulence. 08/14/23   Bartolucci, Clanford L, MD  sucralfate  (CARAFATE ) 1 g tablet Take 1 tablet (1 g total) by mouth 4 (four) times daily -  with meals and at bedtime. 08/14/23   Towles, Clanford L, MD  Vitamin D , Ergocalciferol , (DRISDOL ) 1.25 MG (50000 UNIT) CAPS capsule Take 1 capsule (50,000 Units total) by mouth every 7 (seven) days. 08/04/23   Zarwolo, Gloria, FNP    Physical Exam: Vitals:   08/19/23 2213 08/19/23 2300 08/20/23 0114 08/20/23 0206  BP:  129/75 (!) 95/57 130/73  Pulse:      Resp:  18 15 20   Temp:    98.4 F (36.9 C)  TempSrc:    Oral  SpO2:  99%  96%  Weight: 55.3 kg     Height: 5' 2 (1.575 m)      Physical Exam Constitutional:      Appearance: She is normal weight.  HENT:     Head: Normocephalic.     Mouth/Throat:     Mouth: Mucous membranes are moist.  Eyes:     Extraocular Movements: Extraocular movements intact.  Cardiovascular:     Rate and Rhythm: Normal rate and regular  rhythm.     Heart sounds: Normal heart sounds.  Pulmonary:     Effort: Pulmonary effort is normal.  Abdominal:     General: Abdomen is flat. There is distension. There are signs of injury.     Palpations: Abdomen is soft.     Tenderness: There is abdominal tenderness.  Skin:    General: Skin is warm.     Capillary Refill: Capillary refill takes less than 2 seconds.  Neurological:     General: No focal deficit present.     Mental Status: She is alert.  Psychiatric:        Mood and Affect: Mood normal.       Labs on  Admission: I have personally reviewed the patients's labs and imaging studies.  Assessment/Plan Principal Problem:   Abdominal pain   # Abdominal pain concerning for gastric perforation - CT scan with free air - Recent exploratory laparotomy for repair of gastric ulcer - Surgery consulted and recommended admission with NG tube placement  Plan: Continue Zosyn  NG tube N.p.o. Appreciate surgical recommendations  # GERD # History of perforated gastric ulcer Plan: Continue IV Protonix   # Depression-continue Zoloft    Admission status: Inpatient Telemetry  Certification: The appropriate patient status for this patient is INPATIENT. Inpatient status is judged to be reasonable and necessary in order to provide the required intensity of service to ensure the patient's safety. The patient's presenting symptoms, physical exam findings, and initial radiographic and laboratory data in the context of their chronic comorbidities is felt to place them at high risk for further clinical deterioration. Furthermore, it is not anticipated that the patient will be medically stable for discharge from the hospital within 2 midnights of admission.   * I certify that at the point of admission it is my clinical judgment that the patient will require inpatient hospital care spanning beyond 2 midnights from the point of admission due to high intensity of service, high risk for further  deterioration and high frequency of surveillance required.DEWAINE Lamar Dess MD [facility] If 7PM-7AM, please contact night-coverage [email or web address]  08/20/2023, 2:37 AM

## 2023-08-20 NOTE — Progress Notes (Signed)
 [facility]  Upper G negative for leak. Any air in the leftover quadrant is likely from the drain  as the drain is leaking around the tubing at the skin level.  Hopefully can get JP drain out in the next few days.  Diet ordered .  Manuelita Pander MD

## 2023-08-20 NOTE — Progress Notes (Signed)
 Mobility Specialist Progress Note:    08/20/23 1106  Mobility  Activity Stood at bedside;Pivoted/transferred to/from [facility]  Level of Assistance Standby assist, set-up cues, supervision of patient - no hands on  Assistive Device Other (Comment) (HHA)  Distance Ambulated (ft) 2 ft  Range of Motion/Exercises Active;All extremities  Activity Response Tolerated well  Mobility Referral Yes  Mobility visit 1 Mobility  Mobility Specialist Start Time (ACUTE ONLY) 1048  Mobility Specialist Stop Time (ACUTE ONLY) 1106  Mobility Specialist Time Calculation (min) (ACUTE ONLY) 18 min   Pt received requesting assistance. Required SBA with one person hand-held assist to stand and transfer. Tolerated well, pt not agreeable to further mobility. Returned supine, alarm on. All needs met.   Sherrilee Ditty Mobility Specialist Please contact via Special educational needs teacher or  Rehab office at [phone]

## 2023-08-21 ENCOUNTER — Telehealth: Payer: Self-pay | Admitting: Gastroenterology

## 2023-08-21 ENCOUNTER — Other Ambulatory Visit: Payer: Self-pay | Admitting: *Deleted

## 2023-08-21 DIAGNOSIS — K529 Noninfective gastroenteritis and colitis, unspecified: Secondary | ICD-10-CM | POA: Diagnosis not present

## 2023-08-21 DIAGNOSIS — K219 Gastro-esophageal reflux disease without esophagitis: Secondary | ICD-10-CM

## 2023-08-21 DIAGNOSIS — R109 Unspecified abdominal pain: Secondary | ICD-10-CM | POA: Diagnosis not present

## 2023-08-21 DIAGNOSIS — F1010 Alcohol abuse, uncomplicated: Secondary | ICD-10-CM

## 2023-08-21 DIAGNOSIS — K7031 Alcoholic cirrhosis of liver with ascites: Secondary | ICD-10-CM | POA: Diagnosis not present

## 2023-08-21 DIAGNOSIS — K921 Melena: Secondary | ICD-10-CM | POA: Diagnosis not present

## 2023-08-21 DIAGNOSIS — R188 Other ascites: Secondary | ICD-10-CM

## 2023-08-21 DIAGNOSIS — R1010 Upper abdominal pain, unspecified: Secondary | ICD-10-CM | POA: Diagnosis not present

## 2023-08-21 DIAGNOSIS — K275 Chronic or unspecified peptic ulcer, site unspecified, with perforation: Secondary | ICD-10-CM | POA: Diagnosis not present

## 2023-08-21 DIAGNOSIS — R1084 Generalized abdominal pain: Secondary | ICD-10-CM | POA: Diagnosis not present

## 2023-08-21 LAB — PROTIME-INR
INR: 1.3 — ABNORMAL HIGH (ref 0.8–1.2)
Prothrombin Time: 17.3 s — ABNORMAL HIGH (ref 11.4–15.2)

## 2023-08-21 LAB — CBC
HCT: 31.5 % — ABNORMAL LOW (ref 36.0–46.0)
Hemoglobin: 10.1 g/dL — ABNORMAL LOW (ref 12.0–15.0)
MCH: 29.4 pg (ref 26.0–34.0)
MCHC: 32.1 g/dL (ref 30.0–36.0)
MCV: 91.8 fL (ref 80.0–100.0)
Platelets: 275 K/uL (ref 150–400)
RBC: 3.43 MIL/uL — ABNORMAL LOW (ref 3.87–5.11)
RDW: 17.5 % — ABNORMAL HIGH (ref 11.5–15.5)
WBC: 3.5 K/uL — ABNORMAL LOW (ref 4.0–10.5)
nRBC: 0 % (ref 0.0–0.2)

## 2023-08-21 LAB — CLOSTRIDIUM DIFFICILE BY PCR, REFLEXED
Hypervirulent Strain: NEGATIVE
Toxigenic C. Difficile by PCR: NEGATIVE

## 2023-08-21 LAB — COMPREHENSIVE METABOLIC PANEL WITH GFR
ALT: 37 U/L (ref 0–44)
AST: 96 U/L — ABNORMAL HIGH (ref 15–41)
Albumin: 2.4 g/dL — ABNORMAL LOW (ref 3.5–5.0)
Alkaline Phosphatase: 84 U/L (ref 38–126)
Anion gap: 14 (ref 5–15)
BUN: <5 mg/dL — ABNORMAL LOW (ref 6–20)
CO2: 26 mmol/L (ref 22–32)
Calcium: 8.2 mg/dL — ABNORMAL LOW (ref 8.9–10.3)
Chloride: 91 mmol/L — ABNORMAL LOW (ref 98–111)
Creatinine, Ser: 0.44 mg/dL (ref 0.44–1.00)
GFR, Estimated: >60 mL/min (ref >60)
Glucose, Bld: 110 mg/dL — ABNORMAL HIGH (ref 70–99)
Potassium: 3.8 mmol/L (ref 3.5–5.1)
Sodium: 131 mmol/L — ABNORMAL LOW (ref 135–145)
Total Bilirubin: 1.6 mg/dL — ABNORMAL HIGH (ref 0.0–1.2)
Total Protein: 6.8 g/dL (ref 6.5–8.1)

## 2023-08-21 MED ORDER — VANCOMYCIN HCL 125 MG PO CAPS
125.0000 mg | ORAL_CAPSULE | Freq: Four times a day (QID) | ORAL | Status: DC
  Administered 2023-08-21 – 2023-08-22 (×6): 125 mg via ORAL
  Filled 2023-08-21 (×3): qty 1

## 2023-08-21 MED ORDER — TRAZODONE HCL 50 MG PO TABS
25.0000 mg | ORAL_TABLET | Freq: Every evening | ORAL | Status: DC | PRN
  Administered 2023-08-21 (×4): 25 mg via ORAL
  Filled 2023-08-21 (×2): qty 1

## 2023-08-21 MED ORDER — ENSURE PLUS HIGH PROTEIN PO LIQD
237.0000 mL | Freq: Two times a day (BID) | ORAL | Status: DC
  Administered 2023-08-22 (×2): 237 mL via ORAL

## 2023-08-21 MED ORDER — OXYCODONE HCL 5 MG PO TABS
5.0000 mg | ORAL_TABLET | Freq: Four times a day (QID) | ORAL | Status: DC | PRN
  Administered 2023-08-21 (×2): 5 mg via ORAL
  Administered 2023-08-21 – 2023-08-22 (×6): 10 mg via ORAL
  Filled 2023-08-21 (×2): qty 2
  Filled 2023-08-21: qty 1
  Filled 2023-08-21: qty 2

## 2023-08-21 NOTE — Progress Notes (Signed)
 Subjective: Abdominal pain: Reports having really bad abdominal pain this morning. Was all over from her back all the way around. Also had sharp pain at the bottom of her sternum. Was given pain medications and symptoms eased off in about 30 minutes.   The pain made her sick to her stomach. Nausea, but no vomiting. Received antiemetics and nausea resolved.   Denies NSAID use since discharge.   Diarrhea:  Yesterday had 5-6 BMs that were diarrhea. Stools were black and tarry and also had blood per rectum after the second or 3rd BM that dripped on the floor. Didn't see it in the stool. Stool isn't watery. Thicker. Like pudding.   Had not taken iron or pepto at home.   Difficult to get accurate history from patient regarding her diarrhea, but reports it did seem to worsen prior to returning to the ER. States she was up with diarrhea, nausea, and vomiting.    Cirrhosis:  No HE.  No LE edema or increasing abdominal distension.  Had 2 beer on 08/18/23.    Objective: Vital signs in last 24 hours: Temp:  [98.6 F (37 C)-99.8 F (37.7 C)] 98.6 F (37 C) (08/12 0502) Pulse Rate:  [97-108] 99 (08/12 0502) Resp:  [16-18] 18 (08/12 0502) BP: (117-136)/(69-82) 136/81 (08/12 0502) SpO2:  [97 %] 97 % (08/12 0502) Last BM Date : 08/20/23 General:   Alert and oriented, pleasant, NAD.  Head:  Normocephalic and atraumatic. Eyes:  No icterus, sclera clear. Conjuctiva pink.  Abdomen:  Bowel sounds present. Abdomen is full but soft. Moderate TTP in the right upper and right lower abdomen, worse the closet to incision site. Also with mild LUQ TTP, closer to JP drain site.  No rebound or guarding. Msk:  Symmetrical without gross deformities. Normal posture. Extremities:  Without edema. Neurologic:  Alert and  oriented x4;  grossly normal neurologically. No asterixis.  Skin:  Warm and dry, intact without significant lesions.  Cervical Nodes:  No significant cervical adenopathy. Psych:  Normal mood  and affect.  Intake/Output from previous day: 08/11 0701 - 08/12 0700 In: 392.2 [I.V.:315; IV Piggyback:77.2] Out: 315 [Drains:315] Intake/Output this shift: No intake/output data recorded.  Lab Results: Recent Labs    08/19/23 2219 08/20/23 1504  WBC 5.6 5.5  HGB 10.7* 9.7*  HCT 31.9* 29.6*  PLT 330 147*   BMET Recent Labs    08/19/23 2219 08/20/23 1504  NA 129* 129*  K 3.5 3.8  CL 90* 93*  CO2 24 23  GLUCOSE 84 52*  BUN <5* <5*  CREATININE 0.37* 0.35*  CALCIUM  8.2* 8.0*   LFT Recent Labs    08/19/23 2219 08/20/23 1504  PROT 7.3 6.5  ALBUMIN 2.6* 2.3*  AST 102* 103*  ALT 44 40  ALKPHOS 95 89  BILITOT 1.2 1.5*   PT/INR Recent Labs    08/20/23 1504  LABPROT 16.6*  INR 1.3*    Studies/Results: DG UGI W SINGLE CM (SOL OR THIN BA) Result Date: 08/20/2023 CLINICAL DATA:  Patient with history of perforation of the anterior gastric wall secondary to ulcer status post repair 08/05/2023. Upper GI series 08/10/2023 negative for contrast extravasation and patient was discharged shortly after. She return to the ED yesterday with complaints of abdominal pain in CT abdomen pelvis showed concern for small amount of free air in the left upper quadrant worrisome for perforation. Request for limited upper GI to rule out postoperative perforation. EXAM: DG UGI W SINGLE CM TECHNIQUE: Scout radiograph  was obtained. Single contrast examination was performed using thin liquid barium. This exam was performed by Clotilda Hesselbach, PA-C, and was supervised and interpreted by Ester Sides, MD. FLUOROSCOPY: Radiation Exposure Index (as provided by the fluoroscopic device): 33.10 mGy Kerma COMPARISON:  Upper GI series 08/10/2023, CT abdomen pelvis with contrast 08/19/2023 FINDINGS: Scout Radiograph: Nonspecific bowel gas pattern. Multiple leads overlying the abdomen. Midline surgical staples in place. Surgical drain exiting left upper quadrant with distal tip in the right upper quadrant.  Contrast within the urinary bladder. Stomach: Normal appearance. Contrast fills the stomach without difficulty. No evidence of contrast extravasation in the supine AP, right oblique, left oblique, right lateral, left lateral or prone positions. Gastric emptying: Normal. Duodenum:  Normal appearance on limited exam. IMPRESSION: Single-contrast upper GI without evidence of contrast extravasation. Electronically Signed   By: Ester Sides M.D.   On: 08/20/2023 16:07   CT ABDOMEN PELVIS W CONTRAST Result Date: 08/19/2023 CLINICAL DATA:  Acute abdominal pain EXAM: CT ABDOMEN AND PELVIS WITH CONTRAST TECHNIQUE: Multidetector CT imaging of the abdomen and pelvis was performed using the standard protocol following bolus administration of intravenous contrast. RADIATION DOSE REDUCTION: This exam was performed according to the departmental dose-optimization program which includes automated exposure control, adjustment of the mA and/or kV according to patient size and/or use of iterative reconstruction technique. CONTRAST:  OMNIPAQUE  IOHEXOL  300 MG/ML  SOLN COMPARISON:  CT abdomen and pelvis 08/13/2023 FINDINGS: Lower chest: No acute abnormality. Hepatobiliary: The liver is mildly enlarged. There is a stable 8 mm rounded hypodense area in the inferior right lobe of the liver, unchanged. Slightly nodular liver contour present. There is diffuse gallbladder wall edema similar to the prior study. No radiopaque calculi are identified. No biliary ductal dilatation. Pancreas: Unremarkable. No pancreatic ductal dilatation or surrounding inflammatory changes. Spleen: Normal in size without focal abnormality. Adrenals/Urinary Tract: Adrenal glands are unremarkable. Kidneys are normal, without renal calculi, focal lesion, or hydronephrosis. Bladder is unremarkable. Stomach/Bowel: There is wall thickening and edema of the ascending colon and proximal transverse colon. The appendix is unremarkable. No dilated bowel loops are  present. There is also diffuse wall thickening and edema of the gastric antrum with findings worrisome for gastric ulceration along the anterior antrum. Vascular/Lymphatic: Aorta and IVC are normal in size. There are atherosclerotic calcifications of the aorta. No enlarged lymph nodes are seen. Reproductive: Uterus and bilateral adnexa are unremarkable. Other: There is a small amount of free fluid throughout the abdomen and pelvis which has minimally increased. There is a small amount of free air in the left upper quadrant which is new from prior. Percutaneous drainage catheter is again seen with distal tip adjacent to the gallbladder, unchanged in position. No focal abscess identified. No significant abdominal wall hernia. Musculoskeletal: Mild chronic compression deformity T12 is unchanged. IMPRESSION: 1. New small amount of free air in the left upper quadrant worrisome for bowel perforation. 2. Diffuse wall thickening and edema of the gastric antrum with findings worrisome for gastric ulceration along the anterior antrum. This may be source or free air. 3. Wall thickening and edema of the ascending colon and proximal transverse colon worrisome for colitis. 4. Increasing small amount of free fluid throughout the abdomen and pelvis. 5. Unchanged percutaneous drainage catheter with distal tip adjacent to the gallbladder. No focal abscess identified. 6. Unchanged diffuse gallbladder wall edema. 7. Stable mild hepatomegaly with slightly nodular liver contour. Correlate clinically for cirrhosis. 8. Aortic atherosclerosis. Aortic Atherosclerosis (ICD10-I70.0). Electronically Signed   By:  Greig Pique M.D.   On: 08/19/2023 23:53   DG Chest Port 1 View Result Date: 08/19/2023 CLINICAL DATA:  upper abd pain EXAM: PORTABLE CHEST 1 VIEW COMPARISON:  Chest x-ray 08/04/2023 FINDINGS: The heart and mediastinal contours are unchanged. Atherosclerotic plaque. No focal consolidation. No pulmonary edema. No pleural effusion. No  pneumothorax. No acute osseous abnormality. IMPRESSION: 1. No active disease. 2.  Aortic Atherosclerosis (ICD10-I70.0). Electronically Signed   By: Morgane  Naveau M.D.   On: 08/19/2023 22:50    Assessment: 53 y.o. year old female with a history of ETOH abuse, cirrhosis felt likely due to ETOH, GERD, HTN, SVT, periumbilical hernia, chronic diarrhea, liver lesion in past with normal AFP, chronic anemia,  GI bleed in May 2025 with findings of PUD on EGD, gastric perforation July 2025 and underwent exploratory laparotomy with gastrorrhaphy, omental patch, wedge liver biopsy,  primary umbilical hernia repair, JP drain placement, discharged on 8/5, but returned to the ED on 08/19/23 with abdominal pain, nausea, and vomiting. GI consulted due to rectal bleeding.   Rectal bleeding:  This has been an intermittent issue for the last year, primarily occurring when having loose stools. Likely secondary to hemorrhoid or other benign anorectal source in setting of frequent bowel movements, but unable to rule out colonic etiology such as polyps, malignancy, or IBD. Hgb 10.7 on admission, down to 9.7 yesterday, which is close to her recent baseline. No CBC on file today. Drop in Hgb may be in part hemodilution as well.   Last episode of rectal bleeding was yesterday morning. Anusol  suppositories started yesterday. Will continue to monitor while admitted. Planning for outpatient colonoscopy in light of recent surgery. Prefer to wait minimum of 6 weeks following recent abdominal surgery if possible.   Black stools: Patient reports black stools prior to last admission. During her admission following laparotomy, she reported stools were dark green and tarry, but now reporting black tarry stools again with last episode yesterday. Unclear if this is true melena as I haven't seen her stool. Will recheck CBC today and continue to monitor. Prefer not to perform EGD prior to 4-6 weeks post-op.   Chronic diarrhea/possible  colitis:  Present for the last year that has been felt to be multifactorial in setting of chronic alcohol use, possible EPI, IBS. Celiac studies negative recently and TSH wnl. Difficult to obtain clear history, but patient is reporting worsening diarrhea with nausea and vomiting prior to this admission.   CT this admission suspicious for colitis with wall thickening and edema of the ascending and proximal transverse colon, but this may be more related to portal colopathy. C diff this admission with positive antigen but negative toxin and PCR. She did have about 5 Bms yesterday, but no BM overnight or this morning so far. May need to consider empiric treatment for C diff considering risk factors including recent hospitalization, antibiotics. Would also consider GI path panel for completeness if she has recurrent diarrhea. Otherwise, she will benefit from outpatient colonoscopy once recovered from abdominal surgery for further evaluation to rule out microscopic colitis or IBD.   Cirrhosis:  Likely due to ETOH use. MELD 3.0 was 20 yesterday. No labs on file today. No varices on recent EGD but did have portal gastropathy. No evidence for HE. Chronic hyponatremia noted. She has continued to have increased JP drain output precluding removal of JP drain. 315 cc recorded yesterday. There was concern last admission that this was secondary to ascites, but she wasn't found to have significant  ascites on imaging for paracentesis. CT this admission with minimal increase in fluid through her abdomen and pelvis and patient denies increasing abdominal distension. Unfortunately due to hyponatremia, diuretics cannot be started.   Per Dr. Kallie, she plans to take JP drain out tomorrow and recommended paracentesis Monday next week. We will arrange this for the patient.   Abdominal pain:  Likely multifactorial in the setting of recent extensive abdominal surgery, JP drain remaining in place, and concern for possible colitis  on CT this admission. Also with small amount of free air in the left upper quadrant on CT, but upper GI study on 08/20/2023 was negative for leak. Per Dr. Kallie, any air in the LUQ is likely from JP drain as drain is leaking around the tubing at the skin level.   Plan: CBC, CMP, INR Complete GI pathogen panel if recurrent diarrhea.  Consider empiric treatment for C diff considering positive GDH,  recent antibiotics, hospitalization, concern for colitis on CT, and diarrhea.  Continue PPI BID Continue Carafate  QID Continue anusol  suppository BID Daily MELD labs Outpatient cirrhosis follow-up.  Outpatient colonoscopy ideally not before 6 weeks post-op.  Absolute ETOH cessation. Counseled on this again today.  Strict NSAID avoidance.  Plan for US  ascites assessment followed by paracentesis on Monday 8/18.    LOS: 1 day    08/21/2023, 10:44 AM   Josette Centers, PA-C [facility]

## 2023-08-21 NOTE — Progress Notes (Signed)
 [facility]  JP with serous output. 300 last 24 hours. After further discussion with the patient she likely is having more like 200-300 a day. UGI was negative yesterday. I think some of her pain is from the JP drain itself.   She is having chronic diarrhea and colitis on CT read. Gi is going to treat for possible colitis/ C dif as her antigen was positive.  Diet as tolerated I stopped the zosyn - no indication, no perforation JP with serous ascites, will remove tomorrow, do figure 8 suture and plan for US / paracentesis within 7 days to see if needs ascites removed due to her cirrhosis  Stopped her morphine  and told her to use the roxicodone  as she has no reason to need morphine  at this time.   Manuelita Pander, MD [facility] [address] [place], [name] [address] [phone] (office)

## 2023-08-21 NOTE — Telephone Encounter (Signed)
 Hold off on scheduling for now. When she is discharged, a new message should be sent regarding timing of follow-up.

## 2023-08-21 NOTE — Progress Notes (Signed)
 PROGRESS NOTE  Joan Wood, is a 53 y.o. female, DOB - 1970-11-18, FMW:[id number]  Admit date - 08/19/2023   Admitting Physician Lamar Dess, MD  Outpatient Primary MD for the patient is Zarwolo, Gloria, FNP  LOS - 1  Chief Complaint  Patient presents with   Emesis   Abdominal Pain      Brief Narrative:  53 y.o. female with medical history significant of GERD, hypertension and, recent exploratory laparotomy with omental patch and liver biopsy for gastric ulcer back on 08/05/2023 who was readmitted on 08/20/23 with abdominal pain and concerns for possible free air    -Assessment and Plan: 1) abdominal pain--s/p  exploratory laparotomy with omental patch and liver biopsy for gastric ulcer back on 08/05/2023 -- CT abdomen and pelvis on admission heart failure -Upper Gi study on 08/20/2023 negative for leak. Any air in the leftover quadrant is likely from the drain  as the drain is leaking around the tubing at the skin level. -- General Surgery consult appreciated - No leukocytosis - IV Zosyn  discontinued - General Surgery plans on removing JP drain on 08/22/2023 - Concerns for polysubstance abuse--be judicious with opiates  2) history of peptic ulcer disease with recent gastric ulcer perforation --now with possible GI Bleed--bloody stools - GI consult appreciated -Hgb stable around 10--??  Hemorrhoidal bleed - Continue Protonix   3)Hemorrhoids--- may explain bloody stools - Give Anusol  HC  4) depressive disorder--continue Zoloft   5) diarrhea--check stool for C. difficile stool for GI pathogen - Stool for C. difficile with positive antigen negative PCR negative toxin - Colitis on CT abdomen and pelvis -Patient has no fevers, stools are not really watery - No further BM in in the last  16 hours - GI service indicates that they would like to start empiric treatment for possible C. difficile I will defer to them  6) liver cirrhosis--history of EtOH abuse -She is not  encephalopathic - GI service plans paracentesis about a week from now as outpatient - Per GI service hold off on decision Aldactone until electrolytes improve  Status is: Inpatient   Disposition: The patient is from: Home              Anticipated d/c is to: Home              Anticipated d/c date is: 2 days              Patient currently is not medically stable to d/c. Barriers: Not Clinically Stable-   Code Status :  -  Code Status: Full Code   Family Communication:   NA (patient is alert, awake and coherent)   DVT Prophylaxis  :   - SCDs  SCDs Start: 08/20/23 0237   Lab Results  Component Value Date   PLT 275 08/21/2023    Inpatient Medications  Scheduled Meds:  [START ON 08/22/2023] feeding supplement  237 mL Oral BID BM   hydrocortisone   25 mg Rectal BID   pantoprazole  (PROTONIX ) IV  40 mg Intravenous Q12H   sertraline   25 mg Oral Daily   sucralfate   1 g Oral TID WC & HS   vancomycin   125 mg Oral QID   Continuous Infusions:   PRN Meds:.acetaminophen  **OR** acetaminophen , ondansetron  **OR** ondansetron  (ZOFRAN ) IV, oxyCODONE , traZODone    Anti-infectives (From admission, onward)    Start     Dose/Rate Route Frequency Ordered Stop   08/21/23 1800  vancomycin  (VANCOCIN ) capsule 125 mg        125 mg Oral  4 times daily 08/21/23 1604     08/20/23 0315  piperacillin -tazobactam (ZOSYN ) IVPB 3.375 g  Status:  Discontinued        3.375 g 12.5 mL/hr over 240 Minutes Intravenous Every 8 hours 08/20/23 0301 08/21/23 1211         Subjective: [name] today has no fevers, no emesis,  No chest pain,    - No further BM in about 16 hours   Objective: Vitals:   08/20/23 1419 08/20/23 2015 08/21/23 0502 08/21/23 1412  BP: 132/82 124/69 136/81 122/68  Pulse: 100 97 99 91  Resp: 16 17 18 16   Temp: 99.4 F (37.4 C) 99.8 F (37.7 C) 98.6 F (37 C) 98.2 F (36.8 C)  TempSrc: Oral Oral Oral Oral  SpO2: 97% 97% 97% 98%  Weight:      Height:        Intake/Output  Summary (Last 24 hours) at 08/21/2023 1852 Last data filed at 08/21/2023 1411 Gross per 24 hour  Intake 590.57 ml  Output 355 ml  Net 235.57 ml   Filed Weights   08/19/23 2213  Weight: 55.3 kg    Physical Exam  Gen:- Awake Alert, no acute distress HEENT:- .AT, No sclera icterus Neck-Supple Neck,No JVD,.  Lungs-  CTAB , fair symmetrical air movement CV- S1, S2 normal, regular  Abd-  +ve B.Sounds, Abd Soft, generalized abdominal tenderness, left upper quadrant JP drain noted,    Extremity/Skin:- No  edema, pedal pulses present  Psych-affect is appropriate, oriented x3 Neuro-no new focal deficits, no tremors  Data Reviewed: I have personally reviewed following labs and imaging studies  CBC: Recent Labs  Lab 08/19/23 2219 08/20/23 1504 08/21/23 1108  WBC 5.6 5.5 3.5*  NEUTROABS 3.4 4.0  --   HGB 10.7* 9.7* 10.1*  HCT 31.9* 29.6* 31.5*  MCV 88.1 90.8 91.8  PLT 330 147* 275   Basic Metabolic Panel: Recent Labs  Lab 08/19/23 2219 08/20/23 1504 08/21/23 1108  NA 129* 129* 131*  K 3.5 3.8 3.8  CL 90* 93* 91*  CO2 24 23 26   GLUCOSE 84 52* 110*  BUN <5* <5* <5*  CREATININE 0.37* 0.35* 0.44  CALCIUM  8.2* 8.0* 8.2*  MG 1.7  --   --    GFR: Estimated Creatinine Clearance: 63.6 mL/min (by C-G formula based on SCr of 0.44 mg/dL). Liver Function Tests: Recent Labs  Lab 08/19/23 2219 08/20/23 1504 08/21/23 1108  AST 102* 103* 96*  ALT 44 40 37  ALKPHOS 95 89 84  BILITOT 1.2 1.5* 1.6*  PROT 7.3 6.5 6.8  ALBUMIN 2.6* 2.3* 2.4*   Recent Results (from the past 240 hours)  Culture, blood (routine x 2)     Status: None (Preliminary result)   Collection Time: 08/19/23 10:28 PM   Specimen: Right Antecubital; Blood  Result Value Ref Range Status   Specimen Description RIGHT ANTECUBITAL  Final   Special Requests   Final    BOTTLES DRAWN AEROBIC AND ANAEROBIC Blood Culture adequate volume   Culture   Final    NO GROWTH 2 DAYS Performed at [facility], [address], [place], [name] [address]    Report Status PENDING  Incomplete  Culture, blood (routine x 2)     Status: None (Preliminary result)   Collection Time: 08/19/23 11:08 PM   Specimen: Left Antecubital; Blood  Result Value Ref Range Status   Specimen Description LEFT ANTECUBITAL  Final   Special Requests   Final  BOTTLES DRAWN AEROBIC ONLY Blood Culture adequate volume   Culture   Final    NO GROWTH 2 DAYS Performed at [facility], [address] [place], [name] [address]    Report Status PENDING  Incomplete  C Difficile Quick Screen w PCR reflex     Status: Abnormal   Collection Time: 08/20/23  2:15 PM   Specimen: STOOL  Result Value Ref Range Status   C Diff antigen POSITIVE (A) NEGATIVE Final   C Diff toxin NEGATIVE NEGATIVE Final   C Diff interpretation Results are indeterminate. See PCR results.  Final    Comment: Performed at [facility], [address], [place], [name] [address]  C. Diff by PCR, Reflexed     Status: None   Collection Time: 08/20/23  2:15 PM  Result Value Ref Range Status   Toxigenic C. Difficile by PCR NEGATIVE NEGATIVE Final    Comment: Patient is colonized with non toxigenic C. difficile. May not need treatment unless significant symptoms are present.   Hypervirulent Strain PRESUMPTIVE NEGATIVE PRESUMPTIVE NEGATIVE Final    Comment: Performed at [facility], [address], [place], [name] [address]    Radiology Studies: DG UGI W SINGLE CM (SOL OR THIN BA) Result Date: 08/20/2023 CLINICAL DATA:  Patient with history of perforation of the anterior gastric wall secondary to ulcer status post repair 08/05/2023. Upper GI series 08/10/2023 negative for contrast extravasation and patient was discharged shortly after. She return to the ED yesterday with complaints of abdominal pain in CT abdomen pelvis showed concern for small amount of free air in the left upper quadrant worrisome for perforation. Request for limited upper GI to rule out postoperative  perforation. EXAM: DG UGI W SINGLE CM TECHNIQUE: Scout radiograph was obtained. Single contrast examination was performed using thin liquid barium. This exam was performed by Clotilda Hesselbach, PA-C, and was supervised and interpreted by Ester Sides, MD. FLUOROSCOPY: Radiation Exposure Index (as provided by the fluoroscopic device): 33.10 mGy Kerma COMPARISON:  Upper GI series 08/10/2023, CT abdomen pelvis with contrast 08/19/2023 FINDINGS: Scout Radiograph: Nonspecific bowel gas pattern. Multiple leads overlying the abdomen. Midline surgical staples in place. Surgical drain exiting left upper quadrant with distal tip in the right upper quadrant. Contrast within the urinary bladder. Stomach: Normal appearance. Contrast fills the stomach without difficulty. No evidence of contrast extravasation in the supine AP, right oblique, left oblique, right lateral, left lateral or prone positions. Gastric emptying: Normal. Duodenum:  Normal appearance on limited exam. IMPRESSION: Single-contrast upper GI without evidence of contrast extravasation. Electronically Signed   By: Ester Sides M.D.   On: 08/20/2023 16:07   CT ABDOMEN PELVIS W CONTRAST Result Date: 08/19/2023 CLINICAL DATA:  Acute abdominal pain EXAM: CT ABDOMEN AND PELVIS WITH CONTRAST TECHNIQUE: Multidetector CT imaging of the abdomen and pelvis was performed using the standard protocol following bolus administration of intravenous contrast. RADIATION DOSE REDUCTION: This exam was performed according to the departmental dose-optimization program which includes automated exposure control, adjustment of the mA and/or kV according to patient size and/or use of iterative reconstruction technique. CONTRAST:  OMNIPAQUE  IOHEXOL  300 MG/ML  SOLN COMPARISON:  CT abdomen and pelvis 08/13/2023 FINDINGS: Lower chest: No acute abnormality. Hepatobiliary: The liver is mildly enlarged. There is a stable 8 mm rounded hypodense area in the inferior right lobe of the  liver, unchanged. Slightly nodular liver contour present. There is diffuse gallbladder wall edema similar to the prior study. No radiopaque calculi are identified. No  biliary ductal dilatation. Pancreas: Unremarkable. No pancreatic ductal dilatation or surrounding inflammatory changes. Spleen: Normal in size without focal abnormality. Adrenals/Urinary Tract: Adrenal glands are unremarkable. Kidneys are normal, without renal calculi, focal lesion, or hydronephrosis. Bladder is unremarkable. Stomach/Bowel: There is wall thickening and edema of the ascending colon and proximal transverse colon. The appendix is unremarkable. No dilated bowel loops are present. There is also diffuse wall thickening and edema of the gastric antrum with findings worrisome for gastric ulceration along the anterior antrum. Vascular/Lymphatic: Aorta and IVC are normal in size. There are atherosclerotic calcifications of the aorta. No enlarged lymph nodes are seen. Reproductive: Uterus and bilateral adnexa are unremarkable. Other: There is a small amount of free fluid throughout the abdomen and pelvis which has minimally increased. There is a small amount of free air in the left upper quadrant which is new from prior. Percutaneous drainage catheter is again seen with distal tip adjacent to the gallbladder, unchanged in position. No focal abscess identified. No significant abdominal wall hernia. Musculoskeletal: Mild chronic compression deformity T12 is unchanged. IMPRESSION: 1. New small amount of free air in the left upper quadrant worrisome for bowel perforation. 2. Diffuse wall thickening and edema of the gastric antrum with findings worrisome for gastric ulceration along the anterior antrum. This may be source or free air. 3. Wall thickening and edema of the ascending colon and proximal transverse colon worrisome for colitis. 4. Increasing small amount of free fluid throughout the abdomen and pelvis. 5. Unchanged percutaneous drainage  catheter with distal tip adjacent to the gallbladder. No focal abscess identified. 6. Unchanged diffuse gallbladder wall edema. 7. Stable mild hepatomegaly with slightly nodular liver contour. Correlate clinically for cirrhosis. 8. Aortic atherosclerosis. Aortic Atherosclerosis (ICD10-I70.0). Electronically Signed   By: Greig Pique M.D.   On: 08/19/2023 23:53   DG Chest Port 1 View Result Date: 08/19/2023 CLINICAL DATA:  upper abd pain EXAM: PORTABLE CHEST 1 VIEW COMPARISON:  Chest x-ray 08/04/2023 FINDINGS: The heart and mediastinal contours are unchanged. Atherosclerotic plaque. No focal consolidation. No pulmonary edema. No pleural effusion. No pneumothorax. No acute osseous abnormality. IMPRESSION: 1. No active disease. 2.  Aortic Atherosclerosis (ICD10-I70.0). Electronically Signed   By: Morgane  Naveau M.D.   On: 08/19/2023 22:50   Scheduled Meds:  [START ON 08/22/2023] feeding supplement  237 mL Oral BID BM   hydrocortisone   25 mg Rectal BID   pantoprazole  (PROTONIX ) IV  40 mg Intravenous Q12H   sertraline   25 mg Oral Daily   sucralfate   1 g Oral TID WC & HS   vancomycin   125 mg Oral QID   Continuous Infusions:    LOS: 1 day   Rendall Carwin M.D on 08/21/2023 at 6:52 PM  Go to [email or web address] - for contact info  [facility] - Office  [phone]  If 7PM-7AM, please contact night-coverage [email or web address] 08/21/2023, 6:52 PM

## 2023-08-21 NOTE — Telephone Encounter (Signed)
 Called to schedule us  ascites and para but since patient is currently admitted unable to schedule until patient is discharged. FYI

## 2023-08-21 NOTE — Plan of Care (Signed)

## 2023-08-21 NOTE — Telephone Encounter (Signed)
 Patient will have JP drain removed tomorrow and Dr. Kallie has recommended paracentesis on Monday.   Mindy/Tammy:  Please arrange ultrasound ascites 08/27/23 followed by US  paracentesis same day if she has sufficient ascites.  She will need fluid analysis:  Albumin, cell count with diff, culture, gram stain, body fluid protein.  Max 5L  Dx: Cirrhosis with ascites

## 2023-08-22 ENCOUNTER — Telehealth (HOSPITAL_COMMUNITY): Payer: Self-pay | Admitting: Pharmacy Technician

## 2023-08-22 ENCOUNTER — Encounter: Admitting: General Surgery

## 2023-08-22 ENCOUNTER — Other Ambulatory Visit (HOSPITAL_COMMUNITY): Payer: Self-pay

## 2023-08-22 DIAGNOSIS — R195 Other fecal abnormalities: Secondary | ICD-10-CM | POA: Diagnosis not present

## 2023-08-22 DIAGNOSIS — K219 Gastro-esophageal reflux disease without esophagitis: Secondary | ICD-10-CM | POA: Diagnosis not present

## 2023-08-22 DIAGNOSIS — Z8711 Personal history of peptic ulcer disease: Secondary | ICD-10-CM | POA: Diagnosis not present

## 2023-08-22 DIAGNOSIS — F1090 Alcohol use, unspecified, uncomplicated: Secondary | ICD-10-CM | POA: Diagnosis not present

## 2023-08-22 DIAGNOSIS — K7030 Alcoholic cirrhosis of liver without ascites: Secondary | ICD-10-CM

## 2023-08-22 DIAGNOSIS — R1010 Upper abdominal pain, unspecified: Secondary | ICD-10-CM | POA: Diagnosis not present

## 2023-08-22 DIAGNOSIS — R1084 Generalized abdominal pain: Secondary | ICD-10-CM | POA: Diagnosis not present

## 2023-08-22 DIAGNOSIS — K7460 Unspecified cirrhosis of liver: Secondary | ICD-10-CM | POA: Diagnosis not present

## 2023-08-22 DIAGNOSIS — R109 Unspecified abdominal pain: Secondary | ICD-10-CM | POA: Diagnosis not present

## 2023-08-22 DIAGNOSIS — K921 Melena: Secondary | ICD-10-CM

## 2023-08-22 DIAGNOSIS — K529 Noninfective gastroenteritis and colitis, unspecified: Secondary | ICD-10-CM | POA: Diagnosis not present

## 2023-08-22 DIAGNOSIS — K625 Hemorrhage of anus and rectum: Secondary | ICD-10-CM | POA: Diagnosis not present

## 2023-08-22 DIAGNOSIS — K275 Chronic or unspecified peptic ulcer, site unspecified, with perforation: Secondary | ICD-10-CM | POA: Diagnosis not present

## 2023-08-22 LAB — CBC
HCT: 26.1 % — ABNORMAL LOW (ref 36.0–46.0)
Hemoglobin: 8.6 g/dL — ABNORMAL LOW (ref 12.0–15.0)
MCH: 29.6 pg (ref 26.0–34.0)
MCHC: 33 g/dL (ref 30.0–36.0)
MCV: 89.7 fL (ref 80.0–100.0)
Platelets: 214 K/uL (ref 150–400)
RBC: 2.91 MIL/uL — ABNORMAL LOW (ref 3.87–5.11)
RDW: 17.4 % — ABNORMAL HIGH (ref 11.5–15.5)
WBC: 2.8 K/uL — ABNORMAL LOW (ref 4.0–10.5)
nRBC: 0 % (ref 0.0–0.2)

## 2023-08-22 LAB — COMPREHENSIVE METABOLIC PANEL WITH GFR
ALT: 32 U/L (ref 0–44)
AST: 78 U/L — ABNORMAL HIGH (ref 15–41)
Albumin: 1.9 g/dL — ABNORMAL LOW (ref 3.5–5.0)
Alkaline Phosphatase: 66 U/L (ref 38–126)
Anion gap: 9 (ref 5–15)
BUN: <5 mg/dL — ABNORMAL LOW (ref 6–20)
CO2: 27 mmol/L (ref 22–32)
Calcium: 7.7 mg/dL — ABNORMAL LOW (ref 8.9–10.3)
Chloride: 95 mmol/L — ABNORMAL LOW (ref 98–111)
Creatinine, Ser: <0.3 mg/dL — ABNORMAL LOW (ref 0.44–1.00)
Glucose, Bld: 91 mg/dL (ref 70–99)
Potassium: 2.8 mmol/L — ABNORMAL LOW (ref 3.5–5.1)
Sodium: 131 mmol/L — ABNORMAL LOW (ref 135–145)
Total Bilirubin: 1 mg/dL (ref 0.0–1.2)
Total Protein: 5.4 g/dL — ABNORMAL LOW (ref 6.5–8.1)

## 2023-08-22 MED ORDER — TRAZODONE HCL 50 MG PO TABS: 50.0000 mg | ORAL_TABLET | Freq: Every evening | ORAL | 4 refills | Status: DC | PRN

## 2023-08-22 MED ORDER — ONDANSETRON 4 MG PO TBDP: 4.0000 mg | ORAL_TABLET | Freq: Three times a day (TID) | ORAL | 0 refills | Status: DC | PRN

## 2023-08-22 MED ORDER — POTASSIUM CHLORIDE ER 20 MEQ PO TBCR: 20.0000 meq | EXTENDED_RELEASE_TABLET | Freq: Every day | ORAL | 0 refills | Status: DC

## 2023-08-22 MED ORDER — HYDROCORTISONE ACETATE 25 MG RE SUPP: 25.0000 mg | Freq: Two times a day (BID) | RECTAL | 0 refills | Status: DC

## 2023-08-22 MED ORDER — VITAMIN D (ERGOCALCIFEROL) 1.25 MG (50000 UNIT) PO CAPS: 50000.0000 [IU] | ORAL_CAPSULE | ORAL | 4 refills | Status: DC

## 2023-08-22 MED ORDER — POTASSIUM CHLORIDE CRYS ER 20 MEQ PO TBCR
40.0000 meq | EXTENDED_RELEASE_TABLET | ORAL | Status: AC
  Administered 2023-08-22 (×4): 40 meq via ORAL
  Filled 2023-08-22 (×2): qty 2

## 2023-08-22 MED ORDER — OXYCODONE HCL 5 MG PO TABS: 5.0000 mg | ORAL_TABLET | Freq: Four times a day (QID) | ORAL | 0 refills | Status: DC | PRN

## 2023-08-22 MED ORDER — OMEPRAZOLE 40 MG PO CPDR: 40.0000 mg | DELAYED_RELEASE_CAPSULE | Freq: Two times a day (BID) | ORAL | 5 refills | Status: DC

## 2023-08-22 MED ORDER — SERTRALINE HCL 50 MG PO TABS: 50.0000 mg | ORAL_TABLET | Freq: Every day | ORAL | 4 refills | Status: AC

## 2023-08-22 MED ORDER — VANCOMYCIN HCL 125 MG PO CAPS: 125.0000 mg | ORAL_CAPSULE | Freq: Four times a day (QID) | ORAL | 0 refills | Status: DC

## 2023-08-22 MED ORDER — LIDOCAINE HCL (PF) 1 % IJ SOLN
10.0000 mL | Freq: Once | INTRAMUSCULAR | Status: AC
  Administered 2023-08-22 (×2): 10 mL via INTRADERMAL
  Filled 2023-08-22: qty 10

## 2023-08-22 MED ORDER — SUCRALFATE 1 G PO TABS: 1.0000 g | ORAL_TABLET | Freq: Three times a day (TID) | ORAL | 0 refills | Status: DC

## 2023-08-22 NOTE — Progress Notes (Signed)
 Subjective: States she is not feeling well. Wakes up each morning with burning pain across her abdomen that seems to improve with pain meds and eases off through the day but returns usually in the evening. Pain is present upon waking. Usually eases off and then comes back again around midnight. Pain has been consistent since her surgery with no worsening of symptoms. Feels this pain is causing her nausea. Trying to eat her breakfast. Eating changes her pain very mildly. No BMs today, one small watery stool yesterday that was black and tarry per patient. Feels abdominal distention is about the same with no real changes since right after surgery.   Objective: Vital signs in last 24 hours: Temp:  [97.9 F (36.6 C)-98.4 F (36.9 C)] 98.4 F (36.9 C) (08/13 0335) Pulse Rate:  [87-93] 87 (08/13 0335) Resp:  [16-18] 18 (08/13 0335) BP: (110-133)/(68-81) 110/70 (08/13 0335) SpO2:  [97 %-99 %] 97 % (08/13 0335) Last BM Date : 08/21/23 General:   Alert and oriented, pleasant Head:  Normocephalic and atraumatic. Eyes:  No icterus, sclera clear. Conjuctiva pink.  Mouth:  Without lesions, mucosa pink and moist.  Heart:  S1, S2 present, no murmurs noted.  Lungs: Clear to auscultation bilaterally, without wheezing, rales, or rhonchi.  Abdomen:  Bowel sounds present, soft, diffusely TTP with more TTP to Right sided abdomen. Very minimal distention. Surgical dressing/drain noted to mid left abdomen. No HSM or hernias noted. No rebound or guarding. No masses appreciated  Extremities:  Without clubbing or edema. Neurologic:  Alert and  oriented x4;  grossly normal neurologically. Skin:  Warm and dry, intact without significant lesions.  Psych:  Alert and cooperative. Normal mood and affect.  Intake/Output from previous day: 08/12 0701 - 08/13 0700 In: 950.6 [P.O.:840; IV Piggyback:110.6] Out: 325 [Drains:325] Intake/Output this shift: No intake/output data recorded.  Lab Results: Recent Labs     08/20/23 1504 08/21/23 1108 08/22/23 0519  WBC 5.5 3.5* 2.8*  HGB 9.7* 10.1* 8.6*  HCT 29.6* 31.5* 26.1*  PLT 147* 275 214   BMET Recent Labs    08/20/23 1504 08/21/23 1108 08/22/23 0519  NA 129* 131* 131*  K 3.8 3.8 2.8*  CL 93* 91* 95*  CO2 23 26 27   GLUCOSE 52* 110* 91  BUN <5* <5* <5*  CREATININE 0.35* 0.44 <0.30*  CALCIUM  8.0* 8.2* 7.7*   LFT Recent Labs    08/20/23 1504 08/21/23 1108 08/22/23 0519  PROT 6.5 6.8 5.4*  ALBUMIN 2.3* 2.4* 1.9*  AST 103* 96* 78*  ALT 40 37 32  ALKPHOS 89 84 66  BILITOT 1.5* 1.6* 1.0   PT/INR Recent Labs    08/20/23 1504 08/21/23 1108  LABPROT 16.6* 17.3*  INR 1.3* 1.3*    Studies/Results: DG UGI W SINGLE CM (SOL OR THIN BA) Result Date: 08/20/2023 CLINICAL DATA:  Patient with history of perforation of the anterior gastric wall secondary to ulcer status post repair 08/05/2023. Upper GI series 08/10/2023 negative for contrast extravasation and patient was discharged shortly after. She return to the ED yesterday with complaints of abdominal pain in CT abdomen pelvis showed concern for small amount of free air in the left upper quadrant worrisome for perforation. Request for limited upper GI to rule out postoperative perforation. EXAM: DG UGI W SINGLE CM TECHNIQUE: Scout radiograph was obtained. Single contrast examination was performed using thin liquid barium. This exam was performed by Clotilda Hesselbach, PA-C, and was supervised and interpreted by Ester Sides, MD. FLUOROSCOPY: Radiation Exposure  Index (as provided by the fluoroscopic device): 33.10 mGy Kerma COMPARISON:  Upper GI series 08/10/2023, CT abdomen pelvis with contrast 08/19/2023 FINDINGS: Scout Radiograph: Nonspecific bowel gas pattern. Multiple leads overlying the abdomen. Midline surgical staples in place. Surgical drain exiting left upper quadrant with distal tip in the right upper quadrant. Contrast within the urinary bladder. Stomach: Normal appearance. Contrast fills  the stomach without difficulty. No evidence of contrast extravasation in the supine AP, right oblique, left oblique, right lateral, left lateral or prone positions. Gastric emptying: Normal. Duodenum:  Normal appearance on limited exam. IMPRESSION: Single-contrast upper GI without evidence of contrast extravasation. Electronically Signed   By: Ester Sides M.D.   On: 08/20/2023 16:07    Assessment: 53 y.o. year old female with a history of ETOH abuse, cirrhosis felt likely due to ETOH, GERD, HTN, SVT, periumbilical hernia, chronic diarrhea, liver lesion in past with normal AFP, chronic anemia,  GI bleed in May 2025 with findings of PUD on EGD, gastric perforation July 2025 and underwent exploratory laparotomy with gastrorrhaphy, omental patch, wedge liver biopsy,  primary umbilical hernia repair, JP drain placement, discharged on 8/5, but returned to the ED on 08/19/23 with abdominal pain, nausea, and vomiting. GI consulted due to rectal bleeding.    Rectal bleeding/acute on chronic anemia/black stools: -intermittent x1 year - Hemoglobin 8-9 range at baseline, likely secondary to known peptic ulcer disease with recent perforation, hemorrhoids may also be contributing  -hgb 10.7 on admission, has fluctuated throughout, down to 8.6 this morning, other cell lines down, likely some aspect of hemodilution  -last episode of BR bleeding Monday morning, history of hemorrhoids, though cannot rule out colonic source -black stools prior to last admission, transitioned to green/tarry, now back to tarry and black, last episode yesterday -anusol  suppositories started Monday -planning for outpatient colonoscopy, atleast 6 weeks post op -unclear if this is true melena -recent EGD in may with grade D esophagitis, multiple gastric ulcers -consider repeat EGD at time of colonoscopy if melanotic stools persist   Cirrhosis: - Most likely secondary to EtOH, continues regular alcohol intake with 3-4 beers per day but  states this is significantly less than previously - Serologic evaluation in April with negative viral serologies, elevated IgG but normal ANA, ASMA, and AMA. - Liver biopsy performed at time of surgery with pathology indicating mild to moderate active steatohepatitis (grade 1-2 /3) with cirrhosis - no EVs on recent EGD -no signs of HE -chronic hyponatremia/hypokalemia with potassium 2.8 today -ct this admission with minimal increase in fluid through A/P, patient denies increasing distention -during last admission, concern for ascites though no signficant amount of fluid on imaging for para  MELD 3.0: 18 (with INR from yesterday)   Per Dr. Kallie, plans to remove JP drain today, recommended paracentesis on Monday which has been arranged by our team   Possible liver lesion: - Noted on CT in 2022 with repeat CT abdomen pelvis in May noting nodular liver contour as well as rounded hypodensity too small to characterize in the inferior right lobe of the liver which was unchanged in size and no new liver lesions present. - Normal AFP in April 2025 - Plans for outpatient MRI for further evaluation.   Chronic diarrhea/possible colitis/abdominal pain: -chronic for the last year, felt to be multifactorial in setting of chronic alcohol use, possible EPI, IBS.  -Celiac studies negative recently and TSH WNL.  -reported worsening nausea/diarrhea prior to this admission -CT this admission suspicious for colitis with wall thickening  and edema of the ascending and proximal transverse colon, query portal colopathy -C diff this admission with positive antigen but negative toxin and PCR, empiric therapy started  -consider GI path panel for completeness if she has recurrent diarrhea.  -only one small watery stool yesterday, none today. Diarrhea reassuringly improving. Should collect GI path panel If she has further episodes.  -will benefit from outpatient colonoscopy once recovered from abdominal surgery for  further evaluation to rule out microscopic colitis or IBD.    Plan: MELD labs daily GI pathogen panel  Trend h&h Monitor for overt GI bleeding  PPI BID Carafate  QID Anusol  suppository BID Outpatient cirrhosis follow up Outpatient MRI for liver lesion Outpatient colonoscopy/possible EGD atleast 6 weeks post op Absolute ETOH cessation NSAID avoidance US  ascites assessment Monday 8/18 (this has been ordered) Continue Vancomycin  125mg  QID   LOS: 2 days    08/22/2023, 9:00 AM   Aedin Jeansonne L. Dayron Odland, MSN, APRN, AGNP-C Adult-Gerontology Nurse Practitioner [facility]

## 2023-08-22 NOTE — Plan of Care (Signed)

## 2023-08-22 NOTE — Progress Notes (Signed)
 [facility]  JP drain still with serous output. Discussed with Gi and concern that will make a fistula so will plan to pull drain and get paracentesis next week.   BP 110/70 (BP Location: Left Arm)   Pulse 87   Temp 98.4 F (36.9 C) (Oral)   Resp 18   Ht 5' 2 (1.575 m)   Wt 55.3 kg   SpO2 97%   BMI 22.31 kg/m  Soft, mildly distended, staples c/d/I with minor irritation at staple edge, betadine prepped, lidocaine  injected around JP site, JP removed and figure 8 and U stitch to the hole. Dressing placed.  Patient s/p Ex lap gastric ulcer repair and patch in setting of cirrhosis. JP with still output> 100 some of this likely her ascites. Getting treated for colitis now too.  JP removed. Dressing stay in place until 8/16 unless saturated and needing to replace Paracentesis Monday I will see Tuesday and hopefully can get the midline staples out Stitch in the JP site will stay in longer Roxicodone  5 mg q6 PRN sent in 8 more pills for now   Future Appointments  Date Time Provider Department Center  08/27/2023 11:00 AM Bevely Doffing, FNP [facility]  08/28/2023  9:45 AM Kallie Manuelita BROCKS, MD [facility] None  11/29/2023 10:40 AM Branch, Dorn FALCON, MD [facility]   Manuelita Kallie, MD [facility] [address] [place], [name] [address] [phone] (office)

## 2023-08-22 NOTE — Telephone Encounter (Signed)
 US  ascites and para scheduled for 8/18, arrival 7:45am, npo midnight  Called pt and she is aware of appt details.

## 2023-08-22 NOTE — Telephone Encounter (Signed)
 Patient Product/process development scientist completed.    The patient is insured through [facility].     Ran test claim for vancomycin  125 mg and the current 10 day co-pay is $4.00.   This test claim was processed through [facility]- copay amounts may vary at other pharmacies due to pharmacy/plan contracts, or as the patient moves through the different stages of their insurance plan.     Reyes Sharps, CPHT Pharmacy Technician III Certified Patient Advocate [facility] Patient Advocate Team Direct Number: [phone]  Fax: [phone]

## 2023-08-22 NOTE — Discharge Instructions (Signed)
 1)Please keep wound dressing in place until 08/25/23 unless saturated and needing to replace 2)Please schedule paracentesis for  Monday 08/27/23 3)Please follow-up with Dr. Kallie the general surgeon on Tuesday 08/28/23 and hopefully can get the midline staples out, Stitch in the JP site will stay in longer 4) follow-up with your GI physician for possible EGD/upper endoscopy and colonoscopy as well as MRI of the liver to evaluate your liver lesion and for management of liver cirrhosis 5)Avoid ibuprofen/Advil/Aleve/Motrin/Goody Powders/Naproxen/BC powders/Meloxicam /Diclofenac/Indomethacin and other Nonsteroidal anti-inflammatory medications as these will make you more likely to bleed and can cause stomach ulcers, can also cause Kidney problems. 6)Repeat CBC and CMP on Monday 08/27/2023

## 2023-08-22 NOTE — Telephone Encounter (Signed)
 Patient has been discharged. We should be able to arrange para now.

## 2023-08-22 NOTE — Discharge Summary (Signed)
 Joan Wood getting the same thing had acute forgetting how clear how box how you keep forgetting                                                                      SAGAL GAYTON, Joan a 53 y.o. female  DOB June 20, 1970  MRN [id number].  Admission date:  08/19/2023  Admitting Physician  Lamar Dess, MD  Discharge Date:  08/22/2023   Primary MD  Zarwolo, Gloria, FNP  Recommendations for primary care physician for things to follow:  1)Please keep wound dressing in place until 08/25/23 unless saturated and needing to replace 2)Please schedule paracentesis for  Monday 08/27/23 3)Please follow-up with Dr. Kallie the general surgeon on Tuesday 08/28/23 and hopefully can get the midline staples out, Stitch in the JP site will stay in longer 4) follow-up with your GI physician for possible EGD/upper endoscopy and colonoscopy as well as MRI of the liver to evaluate your liver lesion and for management of liver cirrhosis 5)Avoid ibuprofen/Advil/Aleve/Motrin/Goody Powders/Naproxen/BC powders/Meloxicam /Diclofenac/Indomethacin and other Nonsteroidal anti-inflammatory medications as these will make you more likely to bleed and can cause stomach ulcers, can also cause Kidney problems. 6)Repeat CBC and CMP on Monday 08/27/2023  Admission Diagnosis  Intra-abdominal free air of unknown etiology [K66.8] History of gastric ulcer [Z87.11] Abdominal pain [R10.9]   Discharge Diagnosis  Intra-abdominal free air of unknown etiology [K66.8] History of gastric ulcer [Z87.11] Abdominal pain [R10.9]    Principal Problem:   Abdominal pain Active Problems:   Perforated ulcer (HCC)   GERD (gastroesophageal reflux disease)   Alcoholic cirrhosis (HCC)   Nausea vomiting and diarrhea   Hematochezia      Past Medical History:  Diagnosis Date   Anxiety    Dysrhythmia    GERD (gastroesophageal reflux disease)    Hernia of abdominal wall    History of hiatal hernia    Sleep apnea    SVT (supraventricular  tachycardia) (HCC)     Past Surgical History:  Procedure Laterality Date   ESOPHAGOGASTRODUODENOSCOPY N/A 05/17/2023   Procedure: EGD (ESOPHAGOGASTRODUODENOSCOPY);  Surgeon: Cindie Carlin POUR, DO;  Location: [facility];  Service: Endoscopy;  Laterality: N/A;   FINGER FRACTURE SURGERY     FRACTURE SURGERY     GASTRORRHAPHY N/A 08/05/2023   Procedure: GASTRORRHAPHY;  Surgeon: Kallie Manuelita BROCKS, MD;  Location: [facility];  Service: General;  Laterality: N/A;   LAPAROTOMY N/A 08/05/2023   Procedure: LAPAROTOMY, EXPLORATORY;  Surgeon: Kallie Manuelita BROCKS, MD;  Location: [facility];  Service: General;  Laterality: N/A;   LIVER BIOPSY N/A 08/05/2023   Procedure: BIOPSY, LIVER;  Surgeon: Kallie Manuelita BROCKS, MD;  Location: [facility];  Service: General;  Laterality: N/A;   right ankle repair     age 51   UMBILICAL HERNIA REPAIR N/A 08/05/2023   Procedure: REPAIR, HERNIA, UMBILICAL, ADULT;  Surgeon: Kallie Manuelita BROCKS, MD;  Location: [facility];  Service: General;  Laterality: N/A;     HPI  from the history and physical done on the day of admission:   Chief Complaint: Abdominal pain   HPI: Joan Wood Joan a 53 y.o. female with medical history significant of GERD, hypertension  and, recent exploratory laparotomy with omental patch and liver biopsy for gastric ulcer who presented to the emergency department due to severe abdominal pain.  Patient has current drain in place and developed acute onset worsening pain and nausea as well as several episodes of vomiting.  She was brought to the ER where she was found to be afebrile and hemodynamically stable.  Labs were obtained which showed WBC 5.6, hemoglobin 10.7, CMP unrevealing, lactic acid 1.8, urinalysis negative for infection.  Patient underwent CT abdomen pelvis which showed free air in left upper quadrant concerning for bowel perforation as well as gastric ulceration and colitis.  Surgery was consulted and recommended admission as well as antibiotics.  Patient  will be discussed with surgical team tomorrow.  Patient was endorsing persistent pain that improved with IV narcotics.     Review of Systems: Review of Systems    Hospital Course:   Brief Narrative:  53 y.o. female with medical history significant of GERD, hypertension and, recent exploratory laparotomy with omental patch and liver biopsy for gastric ulcer back on 08/05/2023 who was readmitted on 08/20/23 with abdominal pain and concerns for possible free air     -Assessment and Plan: 1) abdominal pain--s/p  exploratory laparotomy with omental patch and liver biopsy for gastric ulcer back on 08/05/2023 -- CT abdomen and pelvis on admission heart failure -Upper Gi study on 08/20/2023 negative for leak. Any air in the leftover quadrant Joan likely from the drain  as the drain Joan leaking around the tubing at the skin level. -- General Surgery consult appreciated - No leukocytosis - IV Zosyn  discontinued - General Surgery removed JP drain on 08/22/2023 - Concerns for polysubstance abuse--be judicious with opiates   2) history of peptic ulcer disease with recent gastric ulcer perforation --now with possible GI Bleed--bloody stools - GI consult appreciated -Hgb stable around 10--??  Hemorrhoidal bleed - Continue Protonix    3)Hemorrhoids--- may explain bloody stools - ok to use  Anusol  HC   4)Depressive disorder--continue Zoloft    5) diarrhea--check stool for C. difficile stool for GI pathogen - Stool for C. difficile with positive antigen negative PCR negative toxin - Colitis on CT abdomen and pelvis -Patient has no fevers, stools are not really watery - No further BM in in the last  16 hours - GI service indicates that they would like to start empiric treatment for possible C. difficile I will defer to them   6) liver cirrhosis--history of EtOH abuse -She Joan not encephalopathic - GI service plans paracentesis about a week from now as outpatient - Per GI service hold off on decision  Aldactone until electrolytes improve - GI team plans outpatient paracentensis on 08/27/2023 -Outpatient follow-up with GI team as advised   Disposition: The patient Joan from: Home              Anticipated d/c Joan to: Home   Discharge Condition: stable  Follow UP   Follow-up Information     Kallie Manuelita BROCKS, MD. Schedule an appointment as soon as possible for a visit on 08/27/2023.   Specialty: General Surgery Contact information: [address] [facility] [address] [phone]         Cindie Carlin POUR, DO. Schedule an appointment as soon as possible for a visit in 1 week(s).   Specialty: Gastroenterology Contact information: [address] [name] [address] [phone]                  Consults obtained - Darlyn  and Gen surgery  Diet and Activity recommendation:  As advised  Discharge Instructions   Discharge Instructions     Ambulatory Referral for Lung Cancer Scre   Complete by: As directed    Call MD for:  difficulty breathing, headache or visual disturbances   Complete by: As directed    Call MD for:  persistant dizziness or light-headedness   Complete by: As directed    Call MD for:  persistant nausea and vomiting   Complete by: As directed    Call MD for:  redness, tenderness, or signs of infection (pain, swelling, redness, odor or green/yellow discharge around incision site)   Complete by: As directed    Call MD for:  temperature >100.4   Complete by: As directed    Diet - low sodium heart healthy   Complete by: As directed    Discharge instructions   Complete by: As directed    1)Please keep wound dressing in place until 08/25/23 unless saturated and needing to replace 2)Please schedule paracentesis for  Monday 08/27/23 3)Please follow-up with Dr. Kallie the general surgeon on Tuesday 08/28/23 and hopefully can get the midline staples out, Stitch in the JP site will stay in longer 4) follow-up with your GI physician for possible EGD/upper  endoscopy and colonoscopy as well as MRI of the liver to evaluate your liver lesion and for management of liver cirrhosis 5)Avoid ibuprofen/Advil/Aleve/Motrin/Goody Powders/Naproxen/BC powders/Meloxicam /Diclofenac/Indomethacin and other Nonsteroidal anti-inflammatory medications as these will make you more likely to bleed and can cause stomach ulcers, can also cause Kidney problems. 6)Repeat CBC and CMP on Monday 08/27/2023   Discharge wound care:   Complete by: As directed    1)Please keep wound dressing in place until 08/25/23 unless saturated and needing to replace   Increase activity slowly   Complete by: As directed          Discharge Medications     Allergies as of 08/22/2023       Reactions   Bee Venom Anaphylaxis   Cannot take epi for this allergy d/t heart condition per pt   Fire Ant Hives, Rash   Latex Rash        Medication List     TAKE these medications    bisacodyl  10 MG suppository Commonly known as: DULCOLAX Place 1 suppository (10 mg total) rectally daily as needed for moderate constipation.   Fitrite Back Brace with Pulley Misc 1 Units by Does not apply route daily.   hydrocortisone  25 MG suppository Commonly known as: ANUSOL -HC Place 1 suppository (25 mg total) rectally 2 (two) times daily.   methocarbamol  500 MG tablet Commonly known as: ROBAXIN  Take 1 tablet (500 mg total) by mouth every 8 (eight) hours as needed for muscle spasms.   Misc. Devices Misc Closed toe walking boot for right foot toe fractures Dx: S92.301   omeprazole  40 MG capsule Commonly known as: PRILOSEC Take 1 capsule (40 mg total) by mouth 2 (two) times daily.   ondansetron  4 MG disintegrating tablet Commonly known as: ZOFRAN -ODT Take 1 tablet (4 mg total) by mouth every 8 (eight) hours as needed for nausea or vomiting.   oxyCODONE  5 MG immediate release tablet Commonly known as: Oxy IR/ROXICODONE  Take 1 tablet (5 mg total) by mouth every 6 (six) hours as needed for  severe pain (pain score 7-10) or breakthrough pain.   phenol 1.4 % Liqd Commonly known as: CHLORASEPTIC Use as directed 1 spray in the mouth or throat as needed for throat  irritation / pain.   Potassium Chloride  ER 20 MEQ Tbcr Take 1 tablet (20 mEq total) by mouth daily for 5 days. 1 tab daily by mouth---   sertraline  50 MG tablet Commonly known as: ZOLOFT  Take 1 tablet (50 mg total) by mouth daily. What changed:  medication strength how much to take   simethicone  80 MG chewable tablet Commonly known as: MYLICON Chew 1 tablet (80 mg total) by mouth 4 (four) times daily as needed for flatulence.   sucralfate  1 g tablet Commonly known as: CARAFATE  Take 1 tablet (1 g total) by mouth 4 (four) times daily -  with meals and at bedtime for 10 days.   traZODone  50 MG tablet Commonly known as: DESYREL  Take 1 tablet (50 mg total) by mouth at bedtime as needed for sleep.   vancomycin  125 MG capsule Commonly known as: VANCOCIN  Take 1 capsule (125 mg total) by mouth 4 (four) times daily for 10 days.   Vitamin D  (Ergocalciferol ) 1.25 MG (50000 UNIT) Caps capsule Commonly known as: DRISDOL  Take 1 capsule (50,000 Units total) by mouth every 7 (seven) days.               Discharge Care Instructions  (From admission, onward)           Start     Ordered   08/22/23 0000  Discharge wound care:       Comments: 1)Please keep wound dressing in place until 08/25/23 unless saturated and needing to replace   08/22/23 1336           Major procedures and Radiology Reports - PLEASE review detailed and final reports for all details, in brief -  DG UGI W SINGLE CM (SOL OR THIN BA) Result Date: 08/20/2023 CLINICAL DATA:  Patient with history of perforation of the anterior gastric wall secondary to ulcer status post repair 08/05/2023. Upper GI series 08/10/2023 negative for contrast extravasation and patient was discharged shortly after. She return to the ED yesterday with complaints of  abdominal pain in CT abdomen pelvis showed concern for small amount of free air in the left upper quadrant worrisome for perforation. Request for limited upper GI to rule out postoperative perforation. EXAM: DG UGI W SINGLE CM TECHNIQUE: Scout radiograph was obtained. Single contrast examination was performed using thin liquid barium. This exam was performed by Clotilda Hesselbach, PA-C, and was supervised and interpreted by Ester Sides, MD. FLUOROSCOPY: Radiation Exposure Index (as provided by the fluoroscopic device): 33.10 mGy Kerma COMPARISON:  Upper GI series 08/10/2023, CT abdomen pelvis with contrast 08/19/2023 FINDINGS: Scout Radiograph: Nonspecific bowel gas pattern. Multiple leads overlying the abdomen. Midline surgical staples in place. Surgical drain exiting left upper quadrant with distal tip in the right upper quadrant. Contrast within the urinary bladder. Stomach: Normal appearance. Contrast fills the stomach without difficulty. No evidence of contrast extravasation in the supine AP, right oblique, left oblique, right lateral, left lateral or prone positions. Gastric emptying: Normal. Duodenum:  Normal appearance on limited exam. IMPRESSION: Single-contrast upper GI without evidence of contrast extravasation. Electronically Signed   By: Ester Sides M.D.   On: 08/20/2023 16:07   CT ABDOMEN PELVIS W CONTRAST Result Date: 08/19/2023 CLINICAL DATA:  Acute abdominal pain EXAM: CT ABDOMEN AND PELVIS WITH CONTRAST TECHNIQUE: Multidetector CT imaging of the abdomen and pelvis was performed using the standard protocol following bolus administration of intravenous contrast. RADIATION DOSE REDUCTION: This exam was performed according to the departmental dose-optimization program which includes automated exposure control,  adjustment of the mA and/or kV according to patient size and/or use of iterative reconstruction technique. CONTRAST:  OMNIPAQUE  IOHEXOL  300 MG/ML  SOLN COMPARISON:  CT abdomen and  pelvis 08/13/2023 FINDINGS: Lower chest: No acute abnormality. Hepatobiliary: The liver Joan mildly enlarged. There Joan a stable 8 mm rounded hypodense area in the inferior right lobe of the liver, unchanged. Slightly nodular liver contour present. There Joan diffuse gallbladder wall edema similar to the prior study. No radiopaque calculi are identified. No biliary ductal dilatation. Pancreas: Unremarkable. No pancreatic ductal dilatation or surrounding inflammatory changes. Spleen: Normal in size without focal abnormality. Adrenals/Urinary Tract: Adrenal glands are unremarkable. Kidneys are normal, without renal calculi, focal lesion, or hydronephrosis. Bladder Joan unremarkable. Stomach/Bowel: There Joan wall thickening and edema of the ascending colon and proximal transverse colon. The appendix Joan unremarkable. No dilated bowel loops are present. There Joan also diffuse wall thickening and edema of the gastric antrum with findings worrisome for gastric ulceration along the anterior antrum. Vascular/Lymphatic: Aorta and IVC are normal in size. There are atherosclerotic calcifications of the aorta. No enlarged lymph nodes are seen. Reproductive: Uterus and bilateral adnexa are unremarkable. Other: There Joan a small amount of free fluid throughout the abdomen and pelvis which has minimally increased. There Joan a small amount of free air in the left upper quadrant which Joan new from prior. Percutaneous drainage catheter Joan again seen with distal tip adjacent to the gallbladder, unchanged in position. No focal abscess identified. No significant abdominal wall hernia. Musculoskeletal: Mild chronic compression deformity T12 Joan unchanged. IMPRESSION: 1. New small amount of free air in the left upper quadrant worrisome for bowel perforation. 2. Diffuse wall thickening and edema of the gastric antrum with findings worrisome for gastric ulceration along the anterior antrum. This may be source or free air. 3. Wall thickening and edema of  the ascending colon and proximal transverse colon worrisome for colitis. 4. Increasing small amount of free fluid throughout the abdomen and pelvis. 5. Unchanged percutaneous drainage catheter with distal tip adjacent to the gallbladder. No focal abscess identified. 6. Unchanged diffuse gallbladder wall edema. 7. Stable mild hepatomegaly with slightly nodular liver contour. Correlate clinically for cirrhosis. 8. Aortic atherosclerosis. Aortic Atherosclerosis (ICD10-I70.0). Electronically Signed   By: Greig Pique M.D.   On: 08/19/2023 23:53   DG Chest Port 1 View Result Date: 08/19/2023 CLINICAL DATA:  upper abd pain EXAM: PORTABLE CHEST 1 VIEW COMPARISON:  Chest x-ray 08/04/2023 FINDINGS: The heart and mediastinal contours are unchanged. Atherosclerotic plaque. No focal consolidation. No pulmonary edema. No pleural effusion. No pneumothorax. No acute osseous abnormality. IMPRESSION: 1. No active disease. 2.  Aortic Atherosclerosis (ICD10-I70.0). Electronically Signed   By: Morgane  Naveau M.D.   On: 08/19/2023 22:50   CT ABDOMEN PELVIS W CONTRAST Result Date: 08/13/2023 CLINICAL DATA:  Abdominal pain, post-op. EXAM: CT ABDOMEN AND PELVIS WITH CONTRAST TECHNIQUE: Multidetector CT imaging of the abdomen and pelvis was performed using the standard protocol following bolus administration of intravenous contrast. RADIATION DOSE REDUCTION: This exam was performed according to the departmental dose-optimization program which includes automated exposure control, adjustment of the mA and/or kV according to patient size and/or use of iterative reconstruction technique. CONTRAST:  OMNIPAQUE  IOHEXOL  300 MG/ML  SOLN COMPARISON:  CT scan abdomen and pelvis from 08/05/2023. FINDINGS: Lower chest: There are patchy atelectatic changes in the visualized lung bases. No overt consolidation. No pleural effusion. The heart Joan normal in size. No pericardial effusion. Hepatobiliary: The liver Joan normal  in size. Non-cirrhotic  configuration. There Joan subtle liver surface irregularity/nodularity. Redemonstration of diffuse heterogeneous hypoattenuating liver attenuation, similar to the prior study. The portal vein and its major tributaries are patent. There Joan a stable approximately 9 x 11 mm hypoattenuating lesion in the right hepatic lobe, which Joan too small to adequately characterize. No intrahepatic or extrahepatic bile duct dilation. No calcified gallstones. Normal gallbladder wall thickness. No pericholecystic inflammatory changes. Pancreas: Unremarkable. No pancreatic ductal dilatation or surrounding inflammatory changes. Spleen: Within normal limits. No focal lesion. Adrenals/Urinary Tract: Adrenal glands are unremarkable. No suspicious renal mass. No hydronephrosis. No renal or ureteric calculi. There Joan mild irregular thickening of the anterior wall of urinary bladder. However, no perivesical fat stranding. Findings may represent chronic versus acute cystitis. Correlate clinically and with urinalysis. No focal bladder mass or bladder calculi. Stomach/Bowel: No disproportionate dilation of the small or large bowel loops. No evidence of abnormal bowel wall thickening or inflammatory changes. The appendix Joan unremarkable. Vascular/Lymphatic: Trace amount of ascites between the gallbladder and right hepatic lobe. No pneumoperitoneum. No abdominal or pelvic lymphadenopathy, by size criteria. No aneurysmal dilation of the major abdominal arteries. There are mild peripheral atherosclerotic vascular calcifications of the aorta and its major branches. Reproductive: The uterus Joan unremarkable. No large adnexal mass. Other: Midline epigastric surgical scar and skin staples noted. There Joan a JP drain inserted from left paramedian epigastric approach with its tip in the right upper quadrant just inferior to the gallbladder. There Joan trace amount of fluid between the gallbladder and right hepatic lobe. No walled-off abscess. There Joan a tiny  fat containing right inguinal hernia the soft tissues and abdominal wall are otherwise unremarkable. Musculoskeletal: No suspicious osseous lesions. There are mild multilevel degenerative changes in the visualized spine. IMPRESSION: 1. There Joan a JP-drain inserted from left paramedian epigastric approach with its tip in the right upper quadrant just inferior to the gallbladder. There Joan trace amount of fluid between the gallbladder and right hepatic lobe. No walled-off abscess. 2. There Joan mild irregular thickening of the anterior wall of urinary bladder. However, no perivesical fat stranding. Findings may represent chronic versus acute cystitis. Correlate clinically and with urinalysis. 3. Multiple other nonacute observations, as described above. Aortic Atherosclerosis (ICD10-I70.0). Electronically Signed   By: Ree Molt M.D.   On: 08/13/2023 13:35   DG UGI W SINGLE CM (SOL OR THIN BA) Result Date: 08/10/2023 CLINICAL DATA:  Status post surgical repair of perforated gastric ulcer. Request for limited upper GI to evaluate for leak. EXAM: DG UGI W SINGLE CM TECHNIQUE: Single contrast examination was then performed using water-soluble contrast. Contrast injected down patient's existing nasogastric tube into the stomach. This exam was performed by Franky Rusk PA-C , and was supervised and interpreted by Dr. Ester Sides. FLUOROSCOPY: Radiation Exposure Index (as provided by the fluoroscopic device): 44.50 mGy Kerma COMPARISON:  None Available. FINDINGS: Midline surgical staples consistent with open abdominal surgery. Nasogastric tube with tip terminating in the body of the stomach. Surgical drain noted overlying the expected surgical site. Contrast fills the stomach without difficulty. Images taken in AP, various obliquities, and right lateral decubitus positions. No definitive evidence of contrast extravasation from the expected surgical site. Normal filling of the pylorus with prompt gastric emptying. Normal  caliber duodenum with active peristalsis. IMPRESSION: No definitive contrast extravasation or leak from expected gastric perforation surgical repair site. Electronically Signed   By: Ester Sides M.D.   On: 08/10/2023 11:37   DG Abd 1  View Result Date: 08/07/2023 CLINICAL DATA:  Nasogastric tube EXAM: ABDOMEN - 1 VIEW COMPARISON:  Abdominal x-ray 08/05/2023 FINDINGS: Nasogastric tube tip at the level of the gastric antrum. Surgical drain and skin staples are again seen in the mid abdomen. Right upper extremity PICC terminates over the distal SVC. The lungs appear clear. There Joan no pneumothorax. The cardiomediastinal silhouette Joan within normal limits. IMPRESSION: Nasogastric tube tip at the level of the gastric antrum. Electronically Signed   By: Greig Pique M.D.   On: 08/07/2023 22:29   US  Abdomen Limited Result Date: 08/06/2023 CLINICAL DATA:  Evaluate for ascites. EXAM: LIMITED ABDOMEN ULTRASOUND FOR ASCITES TECHNIQUE: Limited ultrasound survey for ascites was performed in all four abdominal quadrants. COMPARISON:  CT abdomen August 05, 2023 FINDINGS: No free fluid within the limited images provided in 4 quadrants. IMPRESSION: No ascites identified on current exam and may be obscured by bowel gas (on recent CT from 08/05/2023 there Joan ascites in perihepatic, perisplenic and pelvic cavity). Electronically Signed   By: Megan  Zare M.D.   On: 08/06/2023 16:53   US  EKG SITE RITE Result Date: 08/06/2023 If Site Rite image not attached, placement could not be confirmed due to current cardiac rhythm.  DG Abd 1 View Result Date: 08/05/2023 CLINICAL DATA:  NG tube placement. EXAM: ABDOMEN - 1 VIEW COMPARISON:  None Available. FINDINGS: NG tube tip Joan in the region of the gastric fundus. Side port of the NG tube Joan well below the GE junction. Mild gaseous distention of the stomach evident. Surgical drain overlies the midline upper abdomen. IMPRESSION: NG tube tip Joan in the region of the gastric fundus.  Electronically Signed   By: Camellia Candle M.D.   On: 08/05/2023 06:43   CT ABDOMEN PELVIS W CONTRAST Result Date: 08/05/2023 CLINICAL DATA:  Acute abdominal pain for 2 days, initial encounter EXAM: CT ABDOMEN AND PELVIS WITH CONTRAST TECHNIQUE: Multidetector CT imaging of the abdomen and pelvis was performed using the standard protocol following bolus administration of intravenous contrast. RADIATION DOSE REDUCTION: This exam was performed according to the departmental dose-optimization program which includes automated exposure control, adjustment of the mA and/or kV according to patient size and/or use of iterative reconstruction technique. CONTRAST:  OMNIPAQUE  IOHEXOL  300 MG/ML  SOLN COMPARISON:  05/27/2023 FINDINGS: Lower chest: Lung bases are free of acute infiltrate or sizable effusion. Hepatobiliary: Diffuse decreased attenuation Joan noted throughout the liver likely related to underlying cirrhotic change. Mild nodularity Joan seen. Recanalization of the umbilical vein Joan noted which subsequently passes through a umbilical hernia and than decompresses via collaterals into the left external iliac vein. Gallbladder Joan well distended without cholelithiasis. Some mild pericholecystic fluid Joan noted related to the known perforation. Pancreas: Unremarkable. No pancreatic ductal dilatation or surrounding inflammatory changes. Spleen: Normal in size without focal abnormality. Adrenals/Urinary Tract: Adrenal glands are within normal limits. Kidneys show no renal calculi or obstructive changes. The bladder Joan well distended. Stomach/Bowel: No obstructive or inflammatory changes of the colon are seen. The appendix again demonstrates appendicoliths distally although no significant changes to suggest acute appendicitis are noted. Small bowel Joan within normal limits. There remains thickening of the gastric wall similar to that seen on the prior exam. The outpouching of air suspicious for ulceration on the prior exam  now shows free communication through the anterior gastric wall along the posterior aspect of the left lobe of the liver consistent with perforated ulcer. Considerable free air Joan noted. Free fluid Joan noted as well  related to these changes. Small hiatal hernia Joan noted. Vascular/Lymphatic: Aortic atherosclerosis. No enlarged abdominal or pelvic lymph nodes. Reproductive: Uterus and bilateral adnexa are unremarkable. Other: Free fluid and free air Joan noted related to the gastric ulcer perforation. Fat containing umbilical hernia Joan noted. 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 These changes are stable from the prior exam. Musculoskeletal: Old left rib fractures are noted posteriorly. No acute bony abnormality Joan noted. Anterolisthesis of L4 on L5 Joan again noted. IMPRESSION: Changes consistent with perforated anterior gastric ulcer progressed in the interval from the prior exam. Considerable free air and free fluid Joan noted related to the perforation. 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 Prominent appendix with single appendicolith within. This Joan stable in appearance from the prior exam without inflammatory change. Critical Value/emergent results were called by telephone at the time of interpretation on 08/05/2023 at 1:06 am to Dr. DONALD WICKLINE , who verbally acknowledged these results. Electronically Signed   By: Oneil Devonshire M.D.   On: 08/05/2023 01:12   DG Chest Portable 1 View Result Date: 08/04/2023 CLINICAL DATA:  Dyspnea EXAM: PORTABLE CHEST 1 VIEW COMPARISON:  CT 05/12/2023 FINDINGS: The heart size and mediastinal contours are within normal limits. Both lungs are clear. The visualized skeletal structures are unremarkable. IMPRESSION: No active disease. Electronically Signed   By: Dorethia Molt M.D.   On: 08/04/2023 23:50    Micro Results  Recent Results (from the past 240 hours)  Culture, blood (routine x 2)     Status:  None (Preliminary result)   Collection Time: 08/19/23 10:28 PM   Specimen: Right Antecubital; Blood  Result Value Ref Range Status   Specimen Description RIGHT ANTECUBITAL  Final   Special Requests   Final    BOTTLES DRAWN AEROBIC AND ANAEROBIC Blood Culture adequate volume   Culture   Final    NO GROWTH 3 DAYS Performed at [facility], [address] [place], [name] [address]    Report Status PENDING  Incomplete  Culture, blood (routine x 2)     Status: None (Preliminary result)   Collection Time: 08/19/23 11:08 PM   Specimen: Left Antecubital; Blood  Result Value Ref Range Status   Specimen Description LEFT ANTECUBITAL  Final   Special Requests   Final    BOTTLES DRAWN AEROBIC ONLY Blood Culture adequate volume   Culture   Final    NO GROWTH 3 DAYS Performed at [facility], [address] [place], [name] [address]    Report Status PENDING  Incomplete  C Difficile Quick Screen w PCR reflex     Status: Abnormal   Collection Time: 08/20/23  2:15 PM   Specimen: STOOL  Result Value Ref Range Status   C Diff antigen POSITIVE (A) NEGATIVE Final   C Diff toxin NEGATIVE NEGATIVE Final   C Diff interpretation Results are indeterminate. See PCR results.  Final    Comment: Performed at [facility], [address] [place], [name] [address]  C. Diff by PCR, Reflexed     Status: None   Collection Time: 08/20/23  2:15 PM  Result Value Ref Range Status   Toxigenic C. Difficile by PCR NEGATIVE NEGATIVE Final    Comment: Patient Joan colonized with non toxigenic C. difficile. May not need treatment unless significant symptoms are present.   Hypervirulent Strain PRESUMPTIVE NEGATIVE PRESUMPTIVE NEGATIVE Final    Comment: Performed at [facility], [address] [place], [name] [address]    Today   Subjective    Joan Wood today has no new concerns  No fever  Or chills   No Nausea, Vomiting or Diarrhea    Patient has been seen and examined prior to discharge    Objective   Blood pressure 120/76, pulse 99, temperature 98.3 F (36.8 C), resp. rate 18, height 5' 2 (1.575 m), weight 55.3 kg, SpO2 97%.   Intake/Output Summary (Last 24 hours) at 08/22/2023 1339 Last data filed at 08/22/2023 0900 Gross per 24 hour  Intake 770.57 ml  Output 245 ml  Net 525.57 ml    Exam Gen:- Awake Alert, no acute distress  HEENT:- [place].AT, No sclera icterus Neck-Supple Neck,No JVD,.  Lungs-  CTAB , good air movement bilaterally CV- S1, S2 normal, regular Abd-  +ve B.Sounds, Abd Soft, No significant tenderness, JP drain removed Extremity/Skin:- No  edema,   good pulses Psych-affect Joan appropriate, oriented x3 Neuro-no new focal deficits, no tremors    Data Review   CBC w Diff:  Lab Results  Component Value Date   WBC 2.8 (L) 08/22/2023   HGB 8.6 (L) 08/22/2023   HGB 9.4 (L) 07/30/2023   HCT 26.1 (L) 08/22/2023   HCT 29.1 (L) 07/30/2023   PLT 214 08/22/2023   PLT 130 (L) 07/30/2023   LYMPHOPCT 13 08/20/2023   MONOPCT 9 08/20/2023   EOSPCT 2 08/20/2023   BASOPCT 1 08/20/2023    CMP:  Lab Results  Component Value Date   NA 131 (L) 08/22/2023   NA 132 (L) 07/30/2023   K 2.8 (L) 08/22/2023   CL 95 (L) 08/22/2023   CO2 27 08/22/2023   BUN <5 (L) 08/22/2023   BUN 5 (L) 07/30/2023   CREATININE <0.30 (L) 08/22/2023   PROT 5.4 (L) 08/22/2023   PROT 8.1 07/30/2023   ALBUMIN 1.9 (L) 08/22/2023   ALBUMIN 3.7 (L) 07/30/2023   BILITOT 1.0 08/22/2023   BILITOT 1.0 07/30/2023   ALKPHOS 66 08/22/2023   AST 78 (H) 08/22/2023   ALT 32 08/22/2023   Total Discharge time Joan about 33 minutes  Rendall Carwin M.D on 08/22/2023 at 1:39 PM  Go to [email or web address] -  for contact info  [facility] - Office  [phone]

## 2023-08-24 LAB — CULTURE, BLOOD (ROUTINE X 2)
Culture: NO GROWTH
Culture: NO GROWTH
Special Requests: ADEQUATE
Special Requests: ADEQUATE

## 2023-08-27 ENCOUNTER — Ambulatory Visit (HOSPITAL_COMMUNITY)
Admission: RE | Admit: 2023-08-27 | Discharge: 2023-08-27 | Disposition: A | Discharge status: Home / Self Care | Source: Ambulatory Visit | Attending: Gastroenterology | Admitting: Gastroenterology

## 2023-08-27 ENCOUNTER — Inpatient Hospital Stay

## 2023-08-27 ENCOUNTER — Ambulatory Visit: Payer: Self-pay | Admitting: Gastroenterology

## 2023-08-27 DIAGNOSIS — K259 Gastric ulcer, unspecified as acute or chronic, without hemorrhage or perforation: Secondary | ICD-10-CM | POA: Diagnosis not present

## 2023-08-27 DIAGNOSIS — R188 Other ascites: Secondary | ICD-10-CM | POA: Insufficient documentation

## 2023-08-27 NOTE — Progress Notes (Signed)
 Patient presents for therapeutic US  limited  shows sufficient amount of ascites. Patient declined the procedure at this time. . Procedure not performed

## 2023-08-28 ENCOUNTER — Encounter: Admitting: General Surgery

## 2023-08-28 DIAGNOSIS — K529 Noninfective gastroenteritis and colitis, unspecified: Secondary | ICD-10-CM

## 2023-08-29 ENCOUNTER — Telehealth: Payer: Self-pay | Admitting: *Deleted

## 2023-08-29 NOTE — Telephone Encounter (Signed)
 Patient did not show for her scheduled appointment with Dr. Kallie on 08/28/2023.   Multiple calls placed to patient to confirm appointment with no answer. VM was left to remind patient.   Patient was to have midline staples and stitch from prior JP drain removed at appointment.   Call placed to patient to follow up. LMTRC.

## 2023-08-30 NOTE — Telephone Encounter (Signed)
Call placed to patient. LMTRC.   MyChart message sent to patient.  

## 2023-08-31 ENCOUNTER — Encounter (HOSPITAL_COMMUNITY): Payer: Self-pay | Admitting: Emergency Medicine

## 2023-08-31 ENCOUNTER — Telehealth: Payer: Self-pay | Admitting: Gastroenterology

## 2023-08-31 ENCOUNTER — Other Ambulatory Visit: Payer: Self-pay

## 2023-08-31 ENCOUNTER — Emergency Department (HOSPITAL_COMMUNITY)

## 2023-08-31 ENCOUNTER — Telehealth: Payer: Self-pay | Admitting: *Deleted

## 2023-08-31 ENCOUNTER — Inpatient Hospital Stay (HOSPITAL_COMMUNITY)
Admission: EM | Admit: 2023-08-31 | Discharge: 2023-09-04 | DRG: 433 | Disposition: A | Discharge status: Home / Self Care | Attending: Family Medicine | Admitting: Family Medicine

## 2023-08-31 DIAGNOSIS — R748 Abnormal levels of other serum enzymes: Secondary | ICD-10-CM | POA: Insufficient documentation

## 2023-08-31 DIAGNOSIS — Z79899 Other long term (current) drug therapy: Secondary | ICD-10-CM

## 2023-08-31 DIAGNOSIS — I10 Essential (primary) hypertension: Secondary | ICD-10-CM | POA: Diagnosis present

## 2023-08-31 DIAGNOSIS — R188 Other ascites: Secondary | ICD-10-CM | POA: Diagnosis not present

## 2023-08-31 DIAGNOSIS — K259 Gastric ulcer, unspecified as acute or chronic, without hemorrhage or perforation: Secondary | ICD-10-CM | POA: Diagnosis not present

## 2023-08-31 DIAGNOSIS — Z91040 Latex allergy status: Secondary | ICD-10-CM

## 2023-08-31 DIAGNOSIS — F1010 Alcohol abuse, uncomplicated: Secondary | ICD-10-CM | POA: Diagnosis present

## 2023-08-31 DIAGNOSIS — R1011 Right upper quadrant pain: Secondary | ICD-10-CM | POA: Diagnosis not present

## 2023-08-31 DIAGNOSIS — E876 Hypokalemia: Secondary | ICD-10-CM | POA: Diagnosis present

## 2023-08-31 DIAGNOSIS — K922 Gastrointestinal hemorrhage, unspecified: Secondary | ICD-10-CM | POA: Diagnosis present

## 2023-08-31 DIAGNOSIS — F17210 Nicotine dependence, cigarettes, uncomplicated: Secondary | ICD-10-CM | POA: Diagnosis present

## 2023-08-31 DIAGNOSIS — R109 Unspecified abdominal pain: Secondary | ICD-10-CM | POA: Diagnosis not present

## 2023-08-31 DIAGNOSIS — R54 Age-related physical debility: Secondary | ICD-10-CM | POA: Diagnosis present

## 2023-08-31 DIAGNOSIS — R55 Syncope and collapse: Secondary | ICD-10-CM

## 2023-08-31 DIAGNOSIS — D500 Iron deficiency anemia secondary to blood loss (chronic): Secondary | ICD-10-CM | POA: Diagnosis present

## 2023-08-31 DIAGNOSIS — G8929 Other chronic pain: Secondary | ICD-10-CM | POA: Diagnosis present

## 2023-08-31 DIAGNOSIS — K7460 Unspecified cirrhosis of liver: Secondary | ICD-10-CM | POA: Insufficient documentation

## 2023-08-31 DIAGNOSIS — R197 Diarrhea, unspecified: Secondary | ICD-10-CM | POA: Diagnosis not present

## 2023-08-31 DIAGNOSIS — K7581 Nonalcoholic steatohepatitis (NASH): Secondary | ICD-10-CM | POA: Diagnosis present

## 2023-08-31 DIAGNOSIS — R112 Nausea with vomiting, unspecified: Secondary | ICD-10-CM | POA: Diagnosis present

## 2023-08-31 DIAGNOSIS — K7031 Alcoholic cirrhosis of liver with ascites: Principal | ICD-10-CM | POA: Diagnosis present

## 2023-08-31 DIAGNOSIS — R1084 Generalized abdominal pain: Secondary | ICD-10-CM | POA: Diagnosis not present

## 2023-08-31 DIAGNOSIS — D696 Thrombocytopenia, unspecified: Secondary | ICD-10-CM | POA: Diagnosis present

## 2023-08-31 DIAGNOSIS — E872 Acidosis, unspecified: Secondary | ICD-10-CM | POA: Diagnosis present

## 2023-08-31 DIAGNOSIS — Z91030 Bee allergy status: Secondary | ICD-10-CM

## 2023-08-31 DIAGNOSIS — Z8711 Personal history of peptic ulcer disease: Secondary | ICD-10-CM

## 2023-08-31 DIAGNOSIS — R1111 Vomiting without nausea: Secondary | ICD-10-CM | POA: Diagnosis not present

## 2023-08-31 DIAGNOSIS — K219 Gastro-esophageal reflux disease without esophagitis: Secondary | ICD-10-CM | POA: Diagnosis present

## 2023-08-31 DIAGNOSIS — R58 Hemorrhage, not elsewhere classified: Secondary | ICD-10-CM | POA: Diagnosis not present

## 2023-08-31 DIAGNOSIS — R000 Tachycardia, unspecified: Secondary | ICD-10-CM | POA: Diagnosis not present

## 2023-08-31 DIAGNOSIS — E871 Hypo-osmolality and hyponatremia: Secondary | ICD-10-CM | POA: Diagnosis present

## 2023-08-31 DIAGNOSIS — E869 Volume depletion, unspecified: Secondary | ICD-10-CM | POA: Diagnosis present

## 2023-08-31 DIAGNOSIS — Z91038 Other insect allergy status: Secondary | ICD-10-CM

## 2023-08-31 DIAGNOSIS — K529 Noninfective gastroenteritis and colitis, unspecified: Secondary | ICD-10-CM | POA: Diagnosis present

## 2023-08-31 DIAGNOSIS — K828 Other specified diseases of gallbladder: Secondary | ICD-10-CM | POA: Diagnosis not present

## 2023-08-31 DIAGNOSIS — G479 Sleep disorder, unspecified: Secondary | ICD-10-CM | POA: Diagnosis not present

## 2023-08-31 DIAGNOSIS — R1010 Upper abdominal pain, unspecified: Principal | ICD-10-CM

## 2023-08-31 DIAGNOSIS — Y906 Blood alcohol level of 120-199 mg/100 ml: Secondary | ICD-10-CM | POA: Diagnosis present

## 2023-08-31 DIAGNOSIS — F1090 Alcohol use, unspecified, uncomplicated: Secondary | ICD-10-CM | POA: Diagnosis present

## 2023-08-31 LAB — CBC WITH DIFFERENTIAL/PLATELET
Abs Immature Granulocytes: 0.04 K/uL (ref 0.00–0.07)
Basophils Absolute: 0 K/uL (ref 0.0–0.1)
Basophils Relative: 0 %
Eosinophils Absolute: 0 K/uL (ref 0.0–0.5)
Eosinophils Relative: 0 %
HCT: 30.3 % — ABNORMAL LOW (ref 36.0–46.0)
Hemoglobin: 10.1 g/dL — ABNORMAL LOW (ref 12.0–15.0)
Immature Granulocytes: 1 %
Lymphocytes Relative: 21 %
Lymphs Abs: 1.3 K/uL (ref 0.7–4.0)
MCH: 29.4 pg (ref 26.0–34.0)
MCHC: 33.3 g/dL (ref 30.0–36.0)
MCV: 88.3 fL (ref 80.0–100.0)
Monocytes Absolute: 0.7 K/uL (ref 0.1–1.0)
Monocytes Relative: 12 %
Neutro Abs: 3.8 K/uL (ref 1.7–7.7)
Neutrophils Relative %: 66 %
Platelets: 148 K/uL — ABNORMAL LOW (ref 150–400)
RBC: 3.43 MIL/uL — ABNORMAL LOW (ref 3.87–5.11)
RDW: 18.1 % — ABNORMAL HIGH (ref 11.5–15.5)
WBC: 5.9 K/uL (ref 4.0–10.5)
nRBC: 0 % (ref 0.0–0.2)

## 2023-08-31 LAB — ETHANOL: Alcohol, Ethyl (B): 139 mg/dL — ABNORMAL HIGH (ref <15)

## 2023-08-31 LAB — I-STAT CHEM 8, ED
BUN: <3 mg/dL — ABNORMAL LOW (ref 6–20)
Calcium, Ion: 0.95 mmol/L — ABNORMAL LOW (ref 1.15–1.40)
Chloride: 87 mmol/L — ABNORMAL LOW (ref 98–111)
Creatinine, Ser: 0.6 mg/dL (ref 0.44–1.00)
Glucose, Bld: 81 mg/dL (ref 70–99)
HCT: 30 % — ABNORMAL LOW (ref 36.0–46.0)
Hemoglobin: 10.2 g/dL — ABNORMAL LOW (ref 12.0–15.0)
Potassium: 2.5 mmol/L — CL (ref 3.5–5.1)
Sodium: 128 mmol/L — ABNORMAL LOW (ref 135–145)
TCO2: 26 mmol/L (ref 22–32)

## 2023-08-31 LAB — URINALYSIS, ROUTINE W REFLEX MICROSCOPIC
Bilirubin Urine: NEGATIVE
Glucose, UA: NEGATIVE mg/dL
Hgb urine dipstick: NEGATIVE
Ketones, ur: NEGATIVE mg/dL
Leukocytes,Ua: NEGATIVE
Nitrite: NEGATIVE
Protein, ur: NEGATIVE mg/dL
Specific Gravity, Urine: 1.001 — ABNORMAL LOW (ref 1.005–1.030)
pH: 7 (ref 5.0–8.0)

## 2023-08-31 LAB — TYPE AND SCREEN
ABO/RH(D): A POS
Antibody Screen: NEGATIVE

## 2023-08-31 LAB — COMPREHENSIVE METABOLIC PANEL WITH GFR
ALT: 22 U/L (ref 0–44)
AST: 56 U/L — ABNORMAL HIGH (ref 15–41)
Albumin: 2.5 g/dL — ABNORMAL LOW (ref 3.5–5.0)
Alkaline Phosphatase: 76 U/L (ref 38–126)
Anion gap: 15 (ref 5–15)
BUN: <5 mg/dL — ABNORMAL LOW (ref 6–20)
CO2: 24 mmol/L (ref 22–32)
Calcium: 8 mg/dL — ABNORMAL LOW (ref 8.9–10.3)
Chloride: 86 mmol/L — ABNORMAL LOW (ref 98–111)
Creatinine, Ser: 0.39 mg/dL — ABNORMAL LOW (ref 0.44–1.00)
GFR, Estimated: >60 mL/min (ref >60)
Glucose, Bld: 86 mg/dL (ref 70–99)
Potassium: 2.6 mmol/L — CL (ref 3.5–5.1)
Sodium: 125 mmol/L — ABNORMAL LOW (ref 135–145)
Total Bilirubin: 1.5 mg/dL — ABNORMAL HIGH (ref 0.0–1.2)
Total Protein: 7.2 g/dL (ref 6.5–8.1)

## 2023-08-31 LAB — LIPASE, BLOOD: Lipase: 82 U/L — ABNORMAL HIGH (ref 11–51)

## 2023-08-31 LAB — LACTIC ACID, PLASMA: Lactic Acid, Venous: 2.3 mmol/L (ref 0.5–1.9)

## 2023-08-31 LAB — POC OCCULT BLOOD, ED: Fecal Occult Blood: POSITIVE — AB

## 2023-08-31 LAB — MAGNESIUM: Magnesium: 1.6 mg/dL — ABNORMAL LOW (ref 1.7–2.4)

## 2023-08-31 MED ORDER — ONDANSETRON HCL 4 MG/2ML IJ SOLN
4.0000 mg | Freq: Once | INTRAMUSCULAR | Status: AC
  Administered 2023-08-31: 4 mg via INTRAVENOUS
  Filled 2023-08-31: qty 2

## 2023-08-31 MED ORDER — PANTOPRAZOLE SODIUM 40 MG IV SOLR
80.0000 mg | Freq: Once | INTRAVENOUS | Status: AC
  Administered 2023-08-31: 80 mg via INTRAVENOUS
  Filled 2023-08-31: qty 20

## 2023-08-31 MED ORDER — IOHEXOL 300 MG/ML  SOLN
100.0000 mL | Freq: Once | INTRAMUSCULAR | Status: AC | PRN
  Administered 2023-08-31: 100 mL via INTRAVENOUS

## 2023-08-31 MED ORDER — SODIUM CHLORIDE 0.9 % IV BOLUS
1000.0000 mL | Freq: Once | INTRAVENOUS | Status: AC
  Administered 2023-08-31: 1000 mL via INTRAVENOUS

## 2023-08-31 MED ORDER — POTASSIUM CHLORIDE 10 MEQ/100ML IV SOLN
10.0000 meq | Freq: Once | INTRAVENOUS | Status: AC
  Administered 2023-08-31: 10 meq via INTRAVENOUS
  Filled 2023-08-31: qty 100

## 2023-08-31 MED ORDER — POTASSIUM CHLORIDE 10 MEQ/100ML IV SOLN
10.0000 meq | Freq: Once | INTRAVENOUS | Status: AC
  Administered 2023-09-01: 10 meq via INTRAVENOUS
  Filled 2023-08-31: qty 100

## 2023-08-31 NOTE — ED Triage Notes (Signed)
 Pt arrives via EMS from home with c/o severe abd pain and black tarry stool since Monday. Pt reports recent abd sx for ruptured ulcer weeks ago. Tenderness to palpation. 50mcg fentanyl  and 4mg  zofran  given en route via EMS.

## 2023-08-31 NOTE — ED Notes (Signed)
 Patient transported to CT

## 2023-08-31 NOTE — Telephone Encounter (Signed)
 Called patient to schedule a follow up in 2 weeks no answer and mailbox is full.

## 2023-08-31 NOTE — ED Provider Notes (Signed)
 [place] EMERGENCY DEPARTMENT AT [facility] Provider Note   CSN: [id number] Arrival date & time: 08/31/23  2136     Patient presents with: Abdominal Pain   Joan Wood is a 53 y.o. female patient status post ex lap for gastric ulcer repair, status post JP drain removal, history of cirrhosis, history of alcohol use, GERD presenting to emergency room with complaint of abdominal pain.  Patient reports she started having severe abdominal pain approximately 4 days ago primarily in upper abdomen.  Since then she has had approximately 3-4 episodes of nonbilious nonbloody vomit per day as well as 3-4 episodes of black stools per day.  She denies any blood in stool.  She reports that she has been drinking alcohol and her last drink was today.  She has not been taking NSAIDs.  She denies any fevers or chills at home.    Abdominal Pain      Prior to Admission medications   Medication Sig Start Date End Date Taking? Authorizing Provider  bisacodyl  (DULCOLAX) 10 MG suppository Place 1 suppository (10 mg total) rectally daily as needed for moderate constipation. 08/14/23   Louder Afton CROME, MD  Elastic Bandages & Supports (FITRITE BACK BRACE WITH PULLEY) MISC 1 Units by Does not apply route daily. 05/25/23   Bevely Doffing, FNP  hydrocortisone  (ANUSOL -HC) 25 MG suppository Place 1 suppository (25 mg total) rectally 2 (two) times daily. 08/22/23   Pearlean Manus, MD  methocarbamol  (ROBAXIN ) 500 MG tablet Take 1 tablet (500 mg total) by mouth every 8 (eight) hours as needed for muscle spasms. 08/14/23   Louder Afton CROME, MD  Misc. Devices MISC Closed toe walking boot for right foot toe fractures Dx: D07.698 05/25/23   Bevely Doffing, FNP  omeprazole  (PRILOSEC) 40 MG capsule Take 1 capsule (40 mg total) by mouth 2 (two) times daily. 08/22/23   Pearlean Manus, MD  ondansetron  (ZOFRAN -ODT) 4 MG disintegrating tablet Take 1 tablet (4 mg total) by mouth every 8 (eight) hours as needed for  nausea or vomiting. 08/22/23   Pearlean Manus, MD  oxyCODONE  (OXY IR/ROXICODONE ) 5 MG immediate release tablet Take 1 tablet (5 mg total) by mouth every 6 (six) hours as needed for severe pain (pain score 7-10) or breakthrough pain. 08/22/23   Kallie Manuelita BROCKS, MD  phenol (CHLORASEPTIC) 1.4 % LIQD Use as directed 1 spray in the mouth or throat as needed for throat irritation / pain. 08/14/23   Bentivegna, Clanford L, MD  Potassium Chloride  ER 20 MEQ TBCR Take 1 tablet (20 mEq total) by mouth daily for 5 days. 1 tab daily by mouth--- 08/22/23 08/27/23  Pearlean Manus, MD  sertraline  (ZOLOFT ) 50 MG tablet Take 1 tablet (50 mg total) by mouth daily. 08/22/23   Pearlean Manus, MD  simethicone  (MYLICON) 80 MG chewable tablet Chew 1 tablet (80 mg total) by mouth 4 (four) times daily as needed for flatulence. 08/14/23   [place]ington, Clanford L, MD  sucralfate  (CARAFATE ) 1 g tablet Take 1 tablet (1 g total) by mouth 4 (four) times daily -  with meals and at bedtime for 10 days. 08/22/23 09/01/23  Pearlean Manus, MD  traZODone  (DESYREL ) 50 MG tablet Take 1 tablet (50 mg total) by mouth at bedtime as needed for sleep. 08/22/23   Pearlean Manus, MD  vancomycin  (VANCOCIN ) 125 MG capsule Take 1 capsule (125 mg total) by mouth 4 (four) times daily for 10 days. 08/22/23 09/01/23  Pearlean Manus, MD  Vitamin D , Ergocalciferol , (DRISDOL ) 1.25  MG (50000 UNIT) CAPS capsule Take 1 capsule (50,000 Units total) by mouth every 7 (seven) days. 08/22/23   Pearlean Manus, MD    Allergies: Bee venom, Fire ant, and Latex    Review of Systems  Gastrointestinal:  Positive for abdominal pain.    Updated Vital Signs BP 108/61 (BP Location: Right Arm)   Pulse (!) 103   Temp 98 F (36.7 C) (Oral)   Resp 19   Ht 5' 2 (1.575 m)   Wt 55.3 kg   SpO2 98%   BMI 22.31 kg/m   Physical Exam Vitals and nursing note reviewed.  Constitutional:      General: She is not in acute distress.    Appearance: She is not toxic-appearing.   HENT:     Head: Normocephalic and atraumatic.  Eyes:     General: No scleral icterus.    Conjunctiva/sclera: Conjunctivae normal.  Cardiovascular:     Rate and Rhythm: Normal rate and regular rhythm.     Pulses: Normal pulses.     Heart sounds: Normal heart sounds.  Pulmonary:     Effort: Pulmonary effort is normal. No respiratory distress.     Breath sounds: Normal breath sounds.  Abdominal:     General: Abdomen is flat. Bowel sounds are normal. There is distension.     Palpations: Abdomen is soft.     Tenderness: There is abdominal tenderness in the right upper quadrant, epigastric area, periumbilical area and left upper quadrant.     Comments: Midline incision with staples in place clean dry and intact.  Lateral incision from JP drain with sutures in place clean dry and intact.   Genitourinary:    Rectum: Guaiac result positive.     Comments: Dark/black stool, no gross blood.  Skin:    General: Skin is warm and dry.     Findings: No lesion.  Neurological:     General: No focal deficit present.     Mental Status: She is alert and oriented to person, place, and time. Mental status is at baseline.     (all labs ordered are listed, but only abnormal results are displayed) Labs Reviewed  CBC WITH DIFFERENTIAL/PLATELET - Abnormal; Notable for the following components:      Result Value   RBC 3.43 (*)    Hemoglobin 10.1 (*)    HCT 30.3 (*)    RDW 18.1 (*)    Platelets 148 (*)    All other components within normal limits  COMPREHENSIVE METABOLIC PANEL WITH GFR - Abnormal; Notable for the following components:   Sodium 125 (*)    Potassium 2.6 (*)    Chloride 86 (*)    BUN <5 (*)    Creatinine, Ser 0.39 (*)    Calcium  8.0 (*)    Albumin 2.5 (*)    AST 56 (*)    Total Bilirubin 1.5 (*)    All other components within normal limits  LIPASE, BLOOD - Abnormal; Notable for the following components:   Lipase 82 (*)    All other components within normal limits  URINALYSIS,  ROUTINE W REFLEX MICROSCOPIC - Abnormal; Notable for the following components:   Color, Urine STRAW (*)    Specific Gravity, Urine 1.001 (*)    All other components within normal limits  LACTIC ACID, PLASMA - Abnormal; Notable for the following components:   Lactic Acid, Venous 2.3 (*)    All other components within normal limits  ETHANOL - Abnormal; Notable for the following components:  Alcohol, Ethyl (B) 139 (*)    All other components within normal limits  MAGNESIUM  - Abnormal; Notable for the following components:   Magnesium  1.6 (*)    All other components within normal limits  POC OCCULT BLOOD, ED - Abnormal  I-STAT CHEM 8, ED - Abnormal; Notable for the following components:   Sodium 128 (*)    Potassium 2.5 (*)    Chloride 87 (*)    BUN <3 (*)    Calcium , Ion 0.95 (*)    Hemoglobin 10.2 (*)    HCT 30.0 (*)    All other components within normal limits  LACTIC ACID, PLASMA  TYPE AND SCREEN    EKG: None  Radiology: No results found.   Procedures   Medications Ordered in the ED  potassium chloride  10 mEq in 100 mL IVPB (has no administration in time range)  potassium chloride  10 mEq in 100 mL IVPB (has no administration in time range)  sodium chloride  0.9 % bolus 1,000 mL (1,000 mLs Intravenous New Bag/Given 08/31/23 2213)  pantoprazole  (PROTONIX ) injection 80 mg (80 mg Intravenous Given 08/31/23 2214)  ondansetron  (ZOFRAN ) injection 4 mg (4 mg Intravenous Given 08/31/23 2213)                                    Medical Decision Making Amount and/or Complexity of Data Reviewed Labs: ordered. Radiology: ordered.  Risk Prescription drug management.   This patient presents to the ED for concern of abdominal pain, this involves an extensive number of treatment options, and is a complaint that carries with it a high risk of complications and morbidity.  The differential diagnosis includes bowel perforation, gastritis, gastroenteritis, GI bleed    Co morbidities  that complicate the patient evaluation  EtOH abuse Recent gastric ulcer perforation    Additional history obtained:  Additional history obtained from recent ED admission and 08/22/2023   Lab Tests:  I personally interpreted labs.  The pertinent results include:   CBC with white blood cell count of 5.9, hemoglobin is 10.1 which is up since prior recent labs. Platelets 148. Hemoccult positive. Type and screen pending.  CMP with potassium of 2.6, natremia at 125.  Normal BUN. Lipase is 82. Etoh 139 Lactic elevated at 2.3  Imaging Studies ordered:  I ordered imaging studies including CT abd/pelvis  Pending at time of sign out.    Cardiac Monitoring: / EKG:  The patient was maintained on a cardiac monitor.  I personally viewed and interpreted the cardiac monitored which showed an underlying rhythm of: Sinus tachycardia with borderline prolonged QT   Problem List / ED Course / Critical interventions / Medication management  Patient presents emergency room with complaint of abdominal pain associated with nausea and vomiting as well as diarrhea.  Describes several episodes of black stools.  She is Hemoccult positive with known recent gastric ulcer, recent perforation.  Her hemoglobin is improved since prior recent lab at 10.1.  Her vital signs are stable and she is not hypotensive. she does continues to drink alcohol, etoh 139. CMP remarkable for potassium of 2.6 which will be supplemented.  Also mildly hyponatremic at 125.  Given Protonix , Zofran  and normal saline as well as potassium supplementation.  Obtaining CT scan of abdomen pelvis. Will need admission for GI bleed and multiple electrolyte abnormalities.  Signed out pending CT scan.         Final diagnoses:  Pain of upper abdomen  Gastrointestinal hemorrhage, unspecified gastrointestinal hemorrhage type  Hypokalemia    ED Discharge Orders     None          Shermon Warren LOISE DEVONNA 08/31/23 2303    Suzette Pac, MD 09/03/23 [phone]

## 2023-08-31 NOTE — Progress Notes (Signed)
 Complex Care Management Note  Care Guide Note 08/31/2023 Name: Joan Wood MRN: [id number] DOB: Aug 05, 1970  Joan Wood is a 53 y.o. year old female who sees Zarwolo, Gloria, FNP for primary care. I reached out to Ciena R Dible by phone today to offer complex care management services.  [name] was given information about Complex Care Management services today including:   The Complex Care Management services include support from the care team which includes your Nurse Care Manager, Clinical Social Worker, or Pharmacist.  The Complex Care Management team is here to help remove barriers to the health concerns and goals most important to you. Complex Care Management services are voluntary, and the patient may decline or stop services at any time by request to their care team member.   Complex Care Management Consent Status: Patient agreed to services and verbal consent obtained.   Follow up plan:  Telephone appointment with complex care management team member scheduled for:  09/18/2023  Encounter Outcome:  Patient Scheduled  Thedford Franks, CMA [place]  [facility], [facility] Guide Direct Dial: [phone]  Fax: [phone] Website: [email or web address]

## 2023-08-31 NOTE — Telephone Encounter (Signed)
 Call placed to [facility], friend [phone] telephone. Joan Wood reports that he spoke with patient on 08/30/2023. Patient reported pain in abdomen. States that he advised her to go to ER if pain worsened. Joan Wood states that he will attempt to reach her again.   No admission noted in patient chart or Care Everywhere.   Call placed to patient to follow up. VM noted full.

## 2023-09-01 ENCOUNTER — Encounter (HOSPITAL_COMMUNITY): Payer: Self-pay | Admitting: Internal Medicine

## 2023-09-01 DIAGNOSIS — Z79899 Other long term (current) drug therapy: Secondary | ICD-10-CM | POA: Diagnosis not present

## 2023-09-01 DIAGNOSIS — D500 Iron deficiency anemia secondary to blood loss (chronic): Secondary | ICD-10-CM | POA: Diagnosis present

## 2023-09-01 DIAGNOSIS — K7460 Unspecified cirrhosis of liver: Secondary | ICD-10-CM | POA: Insufficient documentation

## 2023-09-01 DIAGNOSIS — F1090 Alcohol use, unspecified, uncomplicated: Secondary | ICD-10-CM | POA: Diagnosis not present

## 2023-09-01 DIAGNOSIS — D696 Thrombocytopenia, unspecified: Secondary | ICD-10-CM | POA: Diagnosis present

## 2023-09-01 DIAGNOSIS — Z8719 Personal history of other diseases of the digestive system: Secondary | ICD-10-CM | POA: Diagnosis not present

## 2023-09-01 DIAGNOSIS — E876 Hypokalemia: Secondary | ICD-10-CM

## 2023-09-01 DIAGNOSIS — K7030 Alcoholic cirrhosis of liver without ascites: Secondary | ICD-10-CM

## 2023-09-01 DIAGNOSIS — K7581 Nonalcoholic steatohepatitis (NASH): Secondary | ICD-10-CM | POA: Diagnosis present

## 2023-09-01 DIAGNOSIS — K7031 Alcoholic cirrhosis of liver with ascites: Secondary | ICD-10-CM | POA: Diagnosis present

## 2023-09-01 DIAGNOSIS — R197 Diarrhea, unspecified: Secondary | ICD-10-CM | POA: Diagnosis not present

## 2023-09-01 DIAGNOSIS — F17210 Nicotine dependence, cigarettes, uncomplicated: Secondary | ICD-10-CM | POA: Diagnosis present

## 2023-09-01 DIAGNOSIS — F1010 Alcohol abuse, uncomplicated: Secondary | ICD-10-CM | POA: Diagnosis present

## 2023-09-01 DIAGNOSIS — E872 Acidosis, unspecified: Secondary | ICD-10-CM | POA: Insufficient documentation

## 2023-09-01 DIAGNOSIS — R1010 Upper abdominal pain, unspecified: Secondary | ICD-10-CM

## 2023-09-01 DIAGNOSIS — R1011 Right upper quadrant pain: Secondary | ICD-10-CM | POA: Diagnosis not present

## 2023-09-01 DIAGNOSIS — K2961 Other gastritis with bleeding: Secondary | ICD-10-CM | POA: Diagnosis not present

## 2023-09-01 DIAGNOSIS — R54 Age-related physical debility: Secondary | ICD-10-CM | POA: Diagnosis present

## 2023-09-01 DIAGNOSIS — I10 Essential (primary) hypertension: Secondary | ICD-10-CM | POA: Diagnosis present

## 2023-09-01 DIAGNOSIS — Y906 Blood alcohol level of 120-199 mg/100 ml: Secondary | ICD-10-CM | POA: Diagnosis present

## 2023-09-01 DIAGNOSIS — Z8711 Personal history of peptic ulcer disease: Secondary | ICD-10-CM | POA: Diagnosis not present

## 2023-09-01 DIAGNOSIS — E871 Hypo-osmolality and hyponatremia: Secondary | ICD-10-CM

## 2023-09-01 DIAGNOSIS — K922 Gastrointestinal hemorrhage, unspecified: Secondary | ICD-10-CM | POA: Diagnosis not present

## 2023-09-01 DIAGNOSIS — R188 Other ascites: Secondary | ICD-10-CM | POA: Diagnosis not present

## 2023-09-01 DIAGNOSIS — K259 Gastric ulcer, unspecified as acute or chronic, without hemorrhage or perforation: Secondary | ICD-10-CM | POA: Diagnosis not present

## 2023-09-01 DIAGNOSIS — R112 Nausea with vomiting, unspecified: Secondary | ICD-10-CM

## 2023-09-01 DIAGNOSIS — G8929 Other chronic pain: Secondary | ICD-10-CM | POA: Diagnosis present

## 2023-09-01 DIAGNOSIS — R1110 Vomiting, unspecified: Secondary | ICD-10-CM | POA: Diagnosis not present

## 2023-09-01 DIAGNOSIS — G479 Sleep disorder, unspecified: Secondary | ICD-10-CM | POA: Diagnosis not present

## 2023-09-01 DIAGNOSIS — Z91030 Bee allergy status: Secondary | ICD-10-CM | POA: Diagnosis not present

## 2023-09-01 DIAGNOSIS — R109 Unspecified abdominal pain: Secondary | ICD-10-CM | POA: Diagnosis not present

## 2023-09-01 DIAGNOSIS — K529 Noninfective gastroenteritis and colitis, unspecified: Secondary | ICD-10-CM | POA: Diagnosis not present

## 2023-09-01 DIAGNOSIS — K625 Hemorrhage of anus and rectum: Secondary | ICD-10-CM | POA: Diagnosis not present

## 2023-09-01 DIAGNOSIS — D649 Anemia, unspecified: Secondary | ICD-10-CM | POA: Diagnosis not present

## 2023-09-01 DIAGNOSIS — R748 Abnormal levels of other serum enzymes: Secondary | ICD-10-CM | POA: Diagnosis not present

## 2023-09-01 DIAGNOSIS — K219 Gastro-esophageal reflux disease without esophagitis: Secondary | ICD-10-CM | POA: Diagnosis present

## 2023-09-01 DIAGNOSIS — Z91038 Other insect allergy status: Secondary | ICD-10-CM | POA: Diagnosis not present

## 2023-09-01 DIAGNOSIS — E869 Volume depletion, unspecified: Secondary | ICD-10-CM | POA: Diagnosis present

## 2023-09-01 DIAGNOSIS — Z91040 Latex allergy status: Secondary | ICD-10-CM | POA: Diagnosis not present

## 2023-09-01 DIAGNOSIS — K828 Other specified diseases of gallbladder: Secondary | ICD-10-CM | POA: Diagnosis not present

## 2023-09-01 LAB — COMPREHENSIVE METABOLIC PANEL WITH GFR
ALT: 18 U/L (ref 0–44)
AST: 43 U/L — ABNORMAL HIGH (ref 15–41)
Albumin: 2.1 g/dL — ABNORMAL LOW (ref 3.5–5.0)
Alkaline Phosphatase: 63 U/L (ref 38–126)
Anion gap: 11 (ref 5–15)
BUN: <5 mg/dL — ABNORMAL LOW (ref 6–20)
CO2: 26 mmol/L (ref 22–32)
Calcium: 7.4 mg/dL — ABNORMAL LOW (ref 8.9–10.3)
Chloride: 93 mmol/L — ABNORMAL LOW (ref 98–111)
Creatinine, Ser: 0.36 mg/dL — ABNORMAL LOW (ref 0.44–1.00)
GFR, Estimated: >60 mL/min
Glucose, Bld: 74 mg/dL (ref 70–99)
Potassium: 2.9 mmol/L — ABNORMAL LOW (ref 3.5–5.1)
Sodium: 130 mmol/L — ABNORMAL LOW (ref 135–145)
Total Bilirubin: 1.4 mg/dL — ABNORMAL HIGH (ref 0.0–1.2)
Total Protein: 6 g/dL — ABNORMAL LOW (ref 6.5–8.1)

## 2023-09-01 LAB — MAGNESIUM: Magnesium: 1.8 mg/dL (ref 1.7–2.4)

## 2023-09-01 LAB — CBC
HCT: 26.8 % — ABNORMAL LOW (ref 36.0–46.0)
HCT: 29.6 % — ABNORMAL LOW (ref 36.0–46.0)
Hemoglobin: 8.9 g/dL — ABNORMAL LOW (ref 12.0–15.0)
Hemoglobin: 9.7 g/dL — ABNORMAL LOW (ref 12.0–15.0)
MCH: 29.7 pg (ref 26.0–34.0)
MCH: 29.9 pg (ref 26.0–34.0)
MCHC: 33.2 g/dL (ref 30.0–36.0)
MCHC: 32.8 g/dL (ref 30.0–36.0)
MCV: 89.3 fL (ref 80.0–100.0)
MCV: 91.4 fL (ref 80.0–100.0)
Platelets: 129 K/uL — ABNORMAL LOW (ref 150–400)
Platelets: 125 K/uL — ABNORMAL LOW (ref 150–400)
RBC: 3 MIL/uL — ABNORMAL LOW (ref 3.87–5.11)
RBC: 3.24 MIL/uL — ABNORMAL LOW (ref 3.87–5.11)
RDW: 18.5 % — ABNORMAL HIGH (ref 11.5–15.5)
RDW: 19.2 % — ABNORMAL HIGH (ref 11.5–15.5)
WBC: 4.4 K/uL (ref 4.0–10.5)
WBC: 5.9 K/uL (ref 4.0–10.5)
nRBC: 0 % (ref 0.0–0.2)
nRBC: 0 % (ref 0.0–0.2)

## 2023-09-01 LAB — POTASSIUM: Potassium: 2.9 mmol/L — ABNORMAL LOW (ref 3.5–5.1)

## 2023-09-01 LAB — BASIC METABOLIC PANEL WITH GFR
Anion gap: 10 (ref 5–15)
BUN: <5 mg/dL — ABNORMAL LOW (ref 6–20)
CO2: 23 mmol/L (ref 22–32)
Calcium: 7.7 mg/dL — ABNORMAL LOW (ref 8.9–10.3)
Chloride: 95 mmol/L — ABNORMAL LOW (ref 98–111)
Creatinine, Ser: 0.42 mg/dL — ABNORMAL LOW (ref 0.44–1.00)
GFR, Estimated: >60 mL/min (ref >60)
Glucose, Bld: 77 mg/dL (ref 70–99)
Potassium: 4.8 mmol/L (ref 3.5–5.1)
Sodium: 128 mmol/L — ABNORMAL LOW (ref 135–145)

## 2023-09-01 LAB — OSMOLALITY, URINE: Osmolality, Ur: 69 mosm/kg — ABNORMAL LOW (ref 300–900)

## 2023-09-01 LAB — LACTIC ACID, PLASMA: Lactic Acid, Venous: 1.9 mmol/L (ref 0.5–1.9)

## 2023-09-01 LAB — OSMOLALITY: Osmolality: 279 mosm/kg (ref 275–295)

## 2023-09-01 LAB — SODIUM, URINE, RANDOM: Sodium, Ur: <10 mmol/L

## 2023-09-01 MED ORDER — MAGNESIUM SULFATE 2 GM/50ML IV SOLN
2.0000 g | Freq: Once | INTRAVENOUS | Status: AC
  Administered 2023-09-01: 2 g via INTRAVENOUS
  Filled 2023-09-01: qty 50

## 2023-09-01 MED ORDER — ONDANSETRON HCL 4 MG PO TABS: 4.0000 mg | ORAL_TABLET | Freq: Four times a day (QID) | ORAL | Status: DC | PRN

## 2023-09-01 MED ORDER — SUCRALFATE 1 GM/10ML PO SUSP
1.0000 g | Freq: Three times a day (TID) | ORAL | Status: DC
  Administered 2023-09-01 – 2023-09-04 (×12): 1 g via ORAL
  Filled 2023-09-01 (×12): qty 10

## 2023-09-01 MED ORDER — POTASSIUM CHLORIDE 10 MEQ/100ML IV SOLN
10.0000 meq | INTRAVENOUS | Status: AC
  Administered 2023-09-01 (×6): 10 meq via INTRAVENOUS
  Filled 2023-09-01 (×6): qty 100

## 2023-09-01 MED ORDER — ORAL CARE MOUTH RINSE
15.0000 mL | OROMUCOSAL | Status: DC | PRN
Start: 2023-09-01 — End: 2023-09-04

## 2023-09-01 MED ORDER — ONDANSETRON HCL 4 MG/2ML IJ SOLN
4.0000 mg | Freq: Four times a day (QID) | INTRAMUSCULAR | Status: DC | PRN
  Administered 2023-09-01 – 2023-09-04 (×11): 4 mg via INTRAVENOUS
  Filled 2023-09-01 (×11): qty 2

## 2023-09-01 MED ORDER — SODIUM CHLORIDE 0.9 % IV SOLN: INTRAVENOUS | Status: DC

## 2023-09-01 MED ORDER — POTASSIUM CHLORIDE 10 MEQ/100ML IV SOLN
10.0000 meq | INTRAVENOUS | Status: AC
  Administered 2023-09-01 (×4): 10 meq via INTRAVENOUS
  Filled 2023-09-01 (×3): qty 100

## 2023-09-01 MED ORDER — MORPHINE SULFATE (PF) 2 MG/ML IV SOLN
2.0000 mg | INTRAVENOUS | Status: DC | PRN
  Administered 2023-09-01 – 2023-09-03 (×4): 2 mg via INTRAVENOUS
  Filled 2023-09-01 (×4): qty 1

## 2023-09-01 MED ORDER — POTASSIUM CHLORIDE IN NACL 40-0.9 MEQ/L-% IV SOLN: INTRAVENOUS | Status: DC

## 2023-09-01 MED ORDER — PANTOPRAZOLE SODIUM 40 MG IV SOLR
40.0000 mg | Freq: Two times a day (BID) | INTRAVENOUS | Status: DC
  Administered 2023-09-01 – 2023-09-04 (×7): 40 mg via INTRAVENOUS
  Filled 2023-09-01 (×7): qty 10

## 2023-09-01 MED ORDER — OXYCODONE HCL 5 MG PO TABS
5.0000 mg | ORAL_TABLET | Freq: Four times a day (QID) | ORAL | Status: DC | PRN
  Administered 2023-09-01 – 2023-09-03 (×7): 5 mg via ORAL
  Filled 2023-09-01 (×8): qty 1

## 2023-09-01 MED ORDER — HYDROXYZINE HCL 10 MG PO TABS
10.0000 mg | ORAL_TABLET | Freq: Once | ORAL | Status: AC | PRN
  Administered 2023-09-01: 10 mg via ORAL
  Filled 2023-09-01: qty 1

## 2023-09-01 NOTE — H&P (Signed)
 History and Physical    Patient: Joan Wood FMW:[id number] DOB: [birth year] DOA: 08/31/2023 DOS: the patient was seen and examined on 09/01/2023 PCP: Zarwolo, Gloria, FNP  Patient coming from: Home  Chief Complaint:  Chief Complaint  Patient presents with   Abdominal Pain   HPI: QUIN MCPHERSON is a 53 y.o. female with medical history significant of hypertension, GERD, recent exploratory laparotomy with omental patch and liver biopsy for gastric ulcer back on 08/05/2023 and was readmitted on 08/20/23 with abdominal pain and concerns for possible free air.  She presented to the emergency department due to 6 to 7-day onset of upper abdominal pain which was described as sharp stabbing pain rated as 10/10 on pain scale, she also complained of 6-day onset of nonbilious, nonbloody vomiting of about 4 episodes daily and 5-day onset of several episodes of black stools.  She endorsed to drinking about half can of 12 ounce beer yesterday prior to activating EMS.  She denies use of NSAIDs.  ED Course:  In the emergency department, she was tachycardic with pulse of 104 bpm, but other vital signs were within normal range.  Workup in the ED showed normocytic anemia, platelets 148.  BMP showed sodium 125, potassium 2.6, chloride 86, bicarb 24, blood glucose 86, BUN <5, creatinine 0.39, lactic acid 2.3, alcohol 139, magnesium  1.6, FOBT was positive. CT abdomen and pelvis with contrast showed minimal ascites similar to that seen on the prior exam.  Resolution of previously seen free air.  No findings to suggest perforation at this time.  Persistent ulceration within the gastric wall related to prior perforated ulcer with repair.  No evidence of perforation at this time She was treated with IV Protonix , potassium was replenished, Zofran  was given and IV hydration was provided  Review of Systems: Review of systems as noted in the HPI. All other systems reviewed and are negative.   Past Medical History:   Diagnosis Date   Anxiety    Dysrhythmia    GERD (gastroesophageal reflux disease)    Hernia of abdominal wall    History of hiatal hernia    Sleep apnea    SVT (supraventricular tachycardia) (HCC)    Past Surgical History:  Procedure Laterality Date   ESOPHAGOGASTRODUODENOSCOPY N/A 05/17/2023   Procedure: EGD (ESOPHAGOGASTRODUODENOSCOPY);  Surgeon: Cindie Carlin POUR, DO;  Location: [facility];  Service: Endoscopy;  Laterality: N/A;   FINGER FRACTURE SURGERY     FRACTURE SURGERY     GASTRORRHAPHY N/A 08/05/2023   Procedure: GASTRORRHAPHY;  Surgeon: Kallie Manuelita BROCKS, MD;  Location: [facility];  Service: General;  Laterality: N/A;   LAPAROTOMY N/A 08/05/2023   Procedure: LAPAROTOMY, EXPLORATORY;  Surgeon: Kallie Manuelita BROCKS, MD;  Location: [facility];  Service: General;  Laterality: N/A;   LIVER BIOPSY N/A 08/05/2023   Procedure: BIOPSY, LIVER;  Surgeon: Kallie Manuelita BROCKS, MD;  Location: [facility];  Service: General;  Laterality: N/A;   right ankle repair     age 58   UMBILICAL HERNIA REPAIR N/A 08/05/2023   Procedure: REPAIR, HERNIA, UMBILICAL, ADULT;  Surgeon: Kallie Manuelita BROCKS, MD;  Location: [facility];  Service: General;  Laterality: N/A;    Social History:  reports that she has been smoking cigarettes. She started smoking about 17 months ago. She has a 28.5 pack-year smoking history. She has never used smokeless tobacco. She reports current alcohol use of about 2.0 standard drinks of alcohol per week. She reports that she does not use drugs.   Allergies  Allergen Reactions   Bee Venom Anaphylaxis    Cannot take epi for this allergy d/t heart condition per pt   Fire Ant Hives and Rash   Latex Rash    Family History  Problem Relation Age of Onset   Cancer Mother    Leukemia Mother    Multiple myeloma Mother    Cancer Father        unsure what kind   Cancer - Colon Neg Hx    Colon polyps Neg Hx      Prior to Admission medications   Medication Sig Start Date End Date  Taking? Authorizing Provider  bisacodyl  (DULCOLAX) 10 MG suppository Place 1 suppository (10 mg total) rectally daily as needed for moderate constipation. 08/14/23   Vicci Afton CROME, MD  Elastic Bandages & Supports (FITRITE BACK BRACE WITH PULLEY) MISC 1 Units by Does not apply route daily. 05/25/23   Bevely Doffing, FNP  hydrocortisone  (ANUSOL -HC) 25 MG suppository Place 1 suppository (25 mg total) rectally 2 (two) times daily. 08/22/23   Pearlean Manus, MD  methocarbamol  (ROBAXIN ) 500 MG tablet Take 1 tablet (500 mg total) by mouth every 8 (eight) hours as needed for muscle spasms. 08/14/23   Vicci Afton CROME, MD  Misc. Devices MISC Closed toe walking boot for right foot toe fractures Dx: D07.698 05/25/23   Bevely Doffing, FNP  omeprazole  (PRILOSEC) 40 MG capsule Take 1 capsule (40 mg total) by mouth 2 (two) times daily. 08/22/23   Pearlean Manus, MD  ondansetron  (ZOFRAN -ODT) 4 MG disintegrating tablet Take 1 tablet (4 mg total) by mouth every 8 (eight) hours as needed for nausea or vomiting. 08/22/23   Pearlean Manus, MD  oxyCODONE  (OXY IR/ROXICODONE ) 5 MG immediate release tablet Take 1 tablet (5 mg total) by mouth every 6 (six) hours as needed for severe pain (pain score 7-10) or breakthrough pain. 08/22/23   Kallie Manuelita BROCKS, MD  phenol (CHLORASEPTIC) 1.4 % LIQD Use as directed 1 spray in the mouth or throat as needed for throat irritation / pain. 08/14/23   Vicci Afton CROME, MD  Potassium Chloride  ER 20 MEQ TBCR Take 1 tablet (20 mEq total) by mouth daily for 5 days. 1 tab daily by mouth--- 08/22/23 08/27/23  Pearlean Manus, MD  sertraline  (ZOLOFT ) 50 MG tablet Take 1 tablet (50 mg total) by mouth daily. 08/22/23   Pearlean Manus, MD  simethicone  (MYLICON) 80 MG chewable tablet Chew 1 tablet (80 mg total) by mouth 4 (four) times daily as needed for flatulence. 08/14/23   Ocon, Clanford L, MD  sucralfate  (CARAFATE ) 1 g tablet Take 1 tablet (1 g total) by mouth 4 (four) times daily -  with  meals and at bedtime for 10 days. 08/22/23 09/01/23  Pearlean Manus, MD  traZODone  (DESYREL ) 50 MG tablet Take 1 tablet (50 mg total) by mouth at bedtime as needed for sleep. 08/22/23   Pearlean Manus, MD  vancomycin  (VANCOCIN ) 125 MG capsule Take 1 capsule (125 mg total) by mouth 4 (four) times daily for 10 days. 08/22/23 09/01/23  Pearlean Manus, MD  Vitamin D , Ergocalciferol , (DRISDOL ) 1.25 MG (50000 UNIT) CAPS capsule Take 1 capsule (50,000 Units total) by mouth every 7 (seven) days. 08/22/23   Pearlean Manus, MD    Physical Exam: BP 122/81 (BP Location: Right Arm)   Pulse (!) 111   Temp 98.8 F (37.1 C) (Oral)   Resp 18   Ht 5' 2 (1.575 m)   Wt 55.3 kg   SpO2 96%  BMI 22.31 kg/m   General: 53 y.o. year-old female well developed well nourished in no acute distress.  Alert and oriented x3. HEENT: NCAT, EOMI, dry mucous membranes. Neck: Supple, trachea medial Cardiovascular: Regular rate and rhythm with no rubs or gallops.  No thyromegaly or JVD noted.  No lower extremity edema. 2/4 pulses in all 4 extremities. Respiratory: Clear to auscultation with no wheezes or rales. Good inspiratory effort. Abdomen: Soft, tender to palpation of the epigastric area.  Mid incisional site with staples in place.  No erythema or drainage noted.  Nondistended with normal bowel sounds x4 quadrants. Muskuloskeletal: No cyanosis, clubbing or edema noted bilaterally Neuro: CN II-XII intact, strength 5/5 x 4, sensation, reflexes intact Skin: No ulcerative lesions noted or rashes Psychiatry: Judgement and insight appear normal. Mood is appropriate for condition and setting          Labs on Admission:  Basic Metabolic Panel: Recent Labs  Lab 08/31/23 2152 08/31/23 2240 09/01/23 0047  NA 125* 128*  --   K 2.6* 2.5* 2.9*  CL 86* 87*  --   CO2 24  --   --   GLUCOSE 86 81  --   BUN <5* <3*  --   CREATININE 0.39* 0.60  --   CALCIUM  8.0*  --   --   MG 1.6*  --   --    Liver Function  Tests: Recent Labs  Lab 08/31/23 2152  AST 56*  ALT 22  ALKPHOS 76  BILITOT 1.5*  PROT 7.2  ALBUMIN 2.5*   Recent Labs  Lab 08/31/23 2152  LIPASE 82*   No results for input(s): AMMONIA in the last 168 hours. CBC: Recent Labs  Lab 08/31/23 2152 08/31/23 2240  WBC 5.9  --   NEUTROABS 3.8  --   HGB 10.1* 10.2*  HCT 30.3* 30.0*  MCV 88.3  --   PLT 148*  --    Cardiac Enzymes: No results for input(s): CKTOTAL, CKMB, CKMBINDEX, TROPONINI in the last 168 hours.  BNP (last 3 results) No results for input(s): BNP in the last 8760 hours.  ProBNP (last 3 results) No results for input(s): PROBNP in the last 8760 hours.  CBG: No results for input(s): GLUCAP in the last 168 hours.  Radiological Exams on Admission: CT ABDOMEN PELVIS W CONTRAST Result Date: 08/31/2023 CLINICAL DATA:  Acute abdominal pain, history of prior gastric ulcer perforation EXAM: CT ABDOMEN AND PELVIS WITH CONTRAST TECHNIQUE: Multidetector CT imaging of the abdomen and pelvis was performed using the standard protocol following bolus administration of intravenous contrast. RADIATION DOSE REDUCTION: This exam was performed according to the departmental dose-optimization program which includes automated exposure control, adjustment of the mA and/or kV according to patient size and/or use of iterative reconstruction technique. CONTRAST:  OMNIPAQUE  IOHEXOL  300 MG/ML  SOLN COMPARISON:  08/19/2023 FINDINGS: Lower chest: Lung bases are free of acute infiltrate or sizable effusion. Hepatobiliary: Liver is diffusely decreased in attenuation. Gallbladder is partially distended. Perihepatic fluid is noted stable from the prior exam. Pancreas: Pancreas is well visualized and within normal limits. Spleen: Normal in size without focal abnormality. Minimal perisplenic fluid is seen. Adrenals/Urinary Tract: Adrenal glands are within normal limits. Kidneys demonstrate normal enhancement bilaterally. No renal  calculi or obstructive changes are seen. The bladder is well distended. Stomach/Bowel: No obstructive or inflammatory changes of the colon are seen. The appendix is not well visualized. Small bowel is within normal limits. There remains a large air-filled defect of the gastric  wall consistent with the known previous perforated ulcer with subsequent repair. This is stable from the prior exam. The previously seen free air in the left upper quadrant is no longer identified. The previously seen surgical drain has been removed in the interval. Vascular/Lymphatic: Aortic atherosclerosis. No enlarged abdominal or pelvic lymph nodes. Reproductive: Uterus and bilateral adnexa are unremarkable. Other: No abdominal wall hernia or abnormality. No abdominopelvic ascites. Musculoskeletal: No acute or significant osseous findings. IMPRESSION: Minimal ascites similar to that seen on the prior exam. Resolution of previously seen free air. No findings to suggest perforation are noted at this time. Persistent ulceration within the gastric wall related to prior perforated ulcer with repair. No evidence of perforation is seen at this time. Electronically Signed   By: Oneil Devonshire M.D.   On: 08/31/2023 23:16    EKG: I independently viewed the EKG done and my findings are as followed: Sinus tachycardia at a rate of 103 bpm and QTc of 498 ms.  Assessment/Plan Present on Admission:  Abdominal pain  Nausea and vomiting  Hyponatremia  Alcohol use disorder  Hypokalemia  Hypomagnesemia  Diarrhea  Principal Problem:   GI bleed Active Problems:   Hypokalemia   Diarrhea   Alcohol use disorder   Nausea and vomiting   Hyponatremia   Hypomagnesemia   Abdominal pain   Lactic acidosis   Elevated lipase   Liver cirrhosis (HCC)  GI bleed History of peptic ulcer disease with recent gastric ulcer perforation H/H= 10.1/30.3, this was 8.6/26.1 on 08/22/2023 Hemoccult was positive IV Protonix  80 mg x 1 was given Continue IV  Protonix  40 mg twice daily Patient will be placed n.p.o. at this time and management for possible EGD in the morning Gastroenterologist will be consulted  Abdominal pain, nausea and vomiting Continue IV morphine  2 mg q.4h p.r.n. for moderate to severe pain Continue IV Zofran  p.r.n.  Hyponatremia possibly due to beer potomania Na 125 > 128, continue IV NS and continue to monitor sodium levels Continue to monitor sodium with serial BMPs Urine osmolality, serum osmolality and urine sodium will be checked  Hypokalemia/Hypomagnesemia probably due to diarrhea K+ 2.6 > 2.5, this will be replenished Mg 1.6 -this will be replenished  Lactic acidosis - resolved Lactic acid 2.3 > 1.9  Alcohol use disorder Alcohol level was 139 No sign of any withdrawal symptoms at this time. Continue to monitor for withdrawal symptoms and treat accordingly  Elevated lipase Lipase 82, no CT evidence of acute pancreatitis Continue to monitor lipase levels  Diarrhea Last bowel movement was several hours prior to arriving to the ED GI stool panel will be checked C. difficile temporarily held at this time due to ongoing GI bleed  Liver cirrhosis Patient has history of alcohol abuse She is not encephalopathic at this time  DVT prophylaxis: SCDs  Code Status: Full code  Family Communication: None at bedside  Consults: Gastroenterology   Severity of Illness: The appropriate patient status for this patient is INPATIENT. Inpatient status is judged to be reasonable and necessary in order to provide the required intensity of service to ensure the patient's safety. The patient's presenting symptoms, physical exam findings, and initial radiographic and laboratory data in the context of their chronic comorbidities is felt to place them at high risk for further clinical deterioration. Furthermore, it is not anticipated that the patient will be medically stable for discharge from the hospital within 2 midnights  of admission.   * I certify that at the point of admission  it is my clinical judgment that the patient will require inpatient hospital care spanning beyond 2 midnights from the point of admission due to high intensity of service, high risk for further deterioration and high frequency of surveillance required.*  Author: Vernis Eid, DO 09/01/2023 2:04 AM  For on call review [email or web address].

## 2023-09-01 NOTE — Plan of Care (Signed)

## 2023-09-01 NOTE — Progress Notes (Signed)
 PROGRESS NOTE   Joan Wood  FMW:[id number]    DOB: 05/29/1970    DOA: 08/31/2023  PCP: Zarwolo, Gloria, FNP   I have briefly reviewed patients previous medical records in [facility].   Brief Hospital Course:  53 year old female, lives with her roommate, ambulates with the help of a cane, medical history significant for s/p exploratory laparotomy for perforated gastric ulcer repair with omental patch on 7/27, ongoing alcohol use disorder, tobacco abuse, cirrhosis, HTN, GERD, anxiety/depression, multiple hospitalizations (since her sixth hospital admission this year), presented to the ED with complaints of couple days of pain across upper abdomen, severe, associated with multiple episodes of nonbloody emesis and few episodes of black tarry stools in the context of ongoing alcohol use, drank about half a can of a 12 ounce beer on the morning of admission.  Denies NSAID use.  Admitted for nausea, vomiting, abdominal pain, possible upper GI bleed, multiple electrolyte abnormalities.   Assessment & Plan:   Nausea, vomiting, abdominal pain, possible acute upper GI bleed Since hospital admission, apart from mild sinus tachycardia which has since improved, hemodynamically stable Hemoglobin on admission 10.1 which has drifted down to 8.9, which is at her recent baseline on 8/13. FOBT positive. CT A/P with contrast 8/22: Resolution of previously seen free air.  No findings to suggest perforation at this time.  Persistent ulceration within the gastric wall related to prior perforated ulcer with repair. GI consulted.  Discussed with Dr. Eartha who recommends no endoscopy at this time, advance to clear liquids, twice daily IV PPI, sucralfate , clear liquid diet and advance diet as tolerated. Lipase minimally elevated at 82, unclear significance but would not change supportive care management. Do not really think that she has diarrhea.  Discontinued contact isolation and stool testing.  Chronic  GI blood loss anemia As noted above, hemoglobin relatively stable but will need to be closely monitored in the context of possible GI bleed Follow CBC twice daily including this evening and transfuse if hemoglobin 7 g or less.  Severe hypokalemia Secondary to GI losses and poor oral intake Potassium 2.6/2.5 on admission.  Magnesium  replaced Continue to replace potassium aggressively.  Follow BMP this p.m.  Hypomagnesemia Replaced.  Follow daily  Elevated lactate Secondary to cirrhosis and hypoperfusion from GI losses IV fluids and resolved.  Hyponatremia, chronic Secondary to alcohol/beer use, cirrhosis, intravascular volume depletion from GI losses and poor oral intake Presented with serum sodium of 125 which has improved to 130 Improved and stable.  Alcohol use disorder Blood alcohol level 139 on admission. Absolute abstinence counseled CIWA protocol  Tobacco use disorder Absolute cessation counseled Patient declines nicotine  patch  Thrombocytopenia Chronic and intermittent secondary to cirrhosis and alcohol use  Cirrhosis No clinical ascites.  INR 1.3. Liver biopsy from 08/05/2023: Pathology shows mild to moderately active steatohepatitis with cirrhosis.  S/p exploratory laparotomy 7/27 Still has staples in place.  Discussed with RN to contact surgeons on-call regarding removal of staples  Body mass index is 23.46 kg/m.   DVT prophylaxis: SCDs Start: 09/01/23 0114     Code Status: Full Code:  Family Communication: None at bedside Disposition:  Status is: Inpatient Remains inpatient appropriate because: Ongoing abdominal pain, nausea, significant electrolyte abnormalities, monitoring GI bleed.     Consultants:   GI  Procedures:     Subjective:  Evaluated this morning along with patient's RN at bedside.  Reports that when her pain meds wear off, pain across upper abdomen is thousand/10 and comes down  to 4/10 after pain meds.  Nausea controlled with meds.   No vomiting or BM since hospital admission.  Last half a bottle of beer on morning of admission and drinks a couple of beers 3-4 times a week.  Smokes 5 to 6 cigarettes/day.  Objective:   Vitals:   09/01/23 0100 09/01/23 0156 09/01/23 0240 09/01/23 0606  BP:  122/81 123/76 117/85  Pulse: (!) 103 (!) 111 (!) 103 (!) 103  Resp: 20 18 18 18   Temp:  98.8 F (37.1 C) 99 F (37.2 C) 99.5 F (37.5 C)  TempSrc:  Oral Oral Oral  SpO2: 95% 96% 96% 98%  Weight:   58.2 kg   Height:   5' 2.01 (1.575 m)     General exam: Young female, moderately built, frail, chronically ill looking lying comfortably propped up in bed without distress.  Oral mucosa borderline hydration. Respiratory system: Clear to auscultation. Respiratory effort normal. Cardiovascular system: S1 & S2 heard, RRR. No JVD, murmurs, rubs, gallops or clicks. No pedal edema.  Due to hypokalemia, order placed for telemetry. Gastrointestinal system: Abdomen is nondistended, soft, diffuse mild tenderness without rigidity, rebound.  Mild voluntary guarding.  Midline laparotomy site with staples intact and no acute findings.  No organomegaly or masses appreciated.  No significant ascites.  Bowel sounds normally heard. Central nervous system: Alert and oriented. No focal neurological deficits. Extremities: Symmetric 5 x 5 power. Skin: No rashes, lesions or ulcers Psychiatry: Judgement and insight appear normal. Mood & affect appropriate.     Data Reviewed:   I have personally reviewed following labs and imaging studies   CBC: Recent Labs  Lab 08/31/23 2152 08/31/23 2240 09/01/23 0600  WBC 5.9  --  4.4  NEUTROABS 3.8  --   --   HGB 10.1* 10.2* 8.9*  HCT 30.3* 30.0* 26.8*  MCV 88.3  --  89.3  PLT 148*  --  129*    Basic Metabolic Panel: Recent Labs  Lab 08/31/23 2152 08/31/23 2240 09/01/23 0047 09/01/23 0600  NA 125* 128*  --  130*  K 2.6* 2.5* 2.9* 2.9*  CL 86* 87*  --  93*  CO2 24  --   --  26  GLUCOSE 86 81  --   74  BUN <5* <3*  --  <5*  CREATININE 0.39* 0.60  --  0.36*  CALCIUM  8.0*  --   --  7.4*  MG 1.6*  --   --  1.8    Liver Function Tests: Recent Labs  Lab 08/31/23 2152 09/01/23 0600  AST 56* 43*  ALT 22 18  ALKPHOS 76 63  BILITOT 1.5* 1.4*  PROT 7.2 6.0*  ALBUMIN 2.5* 2.1*    CBG: No results for input(s): GLUCAP in the last 168 hours.  Microbiology Studies:  No results found for this or any previous visit (from the past 240 hours).  Radiology Studies:  CT ABDOMEN PELVIS W CONTRAST Result Date: 08/31/2023 CLINICAL DATA:  Acute abdominal pain, history of prior gastric ulcer perforation EXAM: CT ABDOMEN AND PELVIS WITH CONTRAST TECHNIQUE: Multidetector CT imaging of the abdomen and pelvis was performed using the standard protocol following bolus administration of intravenous contrast. RADIATION DOSE REDUCTION: This exam was performed according to the departmental dose-optimization program which includes automated exposure control, adjustment of the mA and/or kV according to patient size and/or use of iterative reconstruction technique. CONTRAST:  OMNIPAQUE  IOHEXOL  300 MG/ML  SOLN COMPARISON:  08/19/2023 FINDINGS: Lower chest: Lung bases are free of  acute infiltrate or sizable effusion. Hepatobiliary: Liver is diffusely decreased in attenuation. Gallbladder is partially distended. Perihepatic fluid is noted stable from the prior exam. Pancreas: Pancreas is well visualized and within normal limits. Spleen: Normal in size without focal abnormality. Minimal perisplenic fluid is seen. Adrenals/Urinary Tract: Adrenal glands are within normal limits. Kidneys demonstrate normal enhancement bilaterally. No renal calculi or obstructive changes are seen. The bladder is well distended. Stomach/Bowel: No obstructive or inflammatory changes of the colon are seen. The appendix is not well visualized. Small bowel is within normal limits. There remains a large air-filled defect of the gastric wall  consistent with the known previous perforated ulcer with subsequent repair. This is stable from the prior exam. The previously seen free air in the left upper quadrant is no longer identified. The previously seen surgical drain has been removed in the interval. Vascular/Lymphatic: Aortic atherosclerosis. No enlarged abdominal or pelvic lymph nodes. Reproductive: Uterus and bilateral adnexa are unremarkable. Other: No abdominal wall hernia or abnormality. No abdominopelvic ascites. Musculoskeletal: No acute or significant osseous findings. IMPRESSION: Minimal ascites similar to that seen on the prior exam. Resolution of previously seen free air. No findings to suggest perforation are noted at this time. Persistent ulceration within the gastric wall related to prior perforated ulcer with repair. No evidence of perforation is seen at this time. Electronically Signed   By: Oneil Devonshire M.D.   On: 08/31/2023 23:16    Scheduled Meds:    pantoprazole  (PROTONIX ) IV  40 mg Intravenous Q12H   sucralfate   1 g Oral TID WC & HS    Continuous Infusions:    potassium chloride  10 mEq (09/01/23 0914)   sodium chloride  0.9 % 1,000 mL with potassium chloride  40 mEq infusion       LOS: 0 days     Trenda Mar, MD,  FACP, [facility], [facility], [facility]   [facility] [place]      To contact the attending provider between 7A-7P or the covering provider during after hours 7P-7A, please log into the web site [email or web address] and access using universal [place] password for that web site. If you do not have the password, please call the hospital operator.  09/01/2023, 9:42 AM

## 2023-09-01 NOTE — Consult Note (Signed)
 Toribio Fortune, M.D. Gastroenterology & Hepatology                                           Patient Name: Joan Wood Account #: @FLAACCTNO @   MRN: [id number] Admission Date: 08/31/2023 Date of Evaluation:  09/01/2023 Time of Evaluation: 9:51 AM   Referring Physician: Trenda Mar, MD  Chief Complaint: Diarrhea, abdominal pain, nausea and vomiting  HPI:  This is a 53 y.o. female with history of SVT, alcoholic cirrhosis, recent perforated gastric ulcer status post Arlyss patch, anxiety, GERD, sleep apnea, SVT, history of alcohol abuse, who came to the hospital for evaluation of recurrent episodes of diarrhea, nausea and vomiting.  Patient reports that since Monday she presented recurrent episodes of bilious vomiting multiple episodes, along with abdominal pain diffusely but worse in the upper abdomen.  She also presented diarrhea and multiple episodes, which she described as watery bowel movements.  Believes that some of the stool had occasionally some fresh blood mixed with it.  She tried to manage the symptoms at home with some nausea medication but she could not improve and decided to come to the hospital.  Denies taking NSAIDs or anticoagulants.  Endorses drinking less than usual, possibly drinking for beers 2 or 3 times per week (however, per most recent admission note it stated that she reported drinking 12 cans of beer before coming to the hospital).  In the ED, he was HD stable and afebrile. Labs were remarkable for potassium 2.6, sodium 125, creatinine 0.39, BUN less than 5, magnesium  1.6, AST 56, ALT 22, lipase 82, total bilirubin 1.5, lactic acid was mildly increased at 2.3, CBC with hemoglobin of 10.1, platelets 148, WBC 5.9.  FOBT was checked which was positive.  CT of the abdomen and pelvis with IV contrast showed minimal ascites similar to previous imaging.  There was persistent ulceration in the gastric wall at the area of previous repair.  No evidence of  perforation.   Past Medical History: SEE CHRONIC ISSSUES: Past Medical History:  Diagnosis Date   Anxiety    Dysrhythmia    GERD (gastroesophageal reflux disease)    Hernia of abdominal wall    History of hiatal hernia    Sleep apnea    SVT (supraventricular tachycardia) (HCC)    Past Surgical History:  Past Surgical History:  Procedure Laterality Date   ESOPHAGOGASTRODUODENOSCOPY N/A 05/17/2023   Procedure: EGD (ESOPHAGOGASTRODUODENOSCOPY);  Surgeon: Cindie Carlin POUR, DO;  Location: [facility];  Service: Endoscopy;  Laterality: N/A;   FINGER FRACTURE SURGERY     FRACTURE SURGERY     GASTRORRHAPHY N/A 08/05/2023   Procedure: GASTRORRHAPHY;  Surgeon: Kallie Manuelita BROCKS, MD;  Location: [facility];  Service: General;  Laterality: N/A;   LAPAROTOMY N/A 08/05/2023   Procedure: LAPAROTOMY, EXPLORATORY;  Surgeon: Kallie Manuelita BROCKS, MD;  Location: [facility];  Service: General;  Laterality: N/A;   LIVER BIOPSY N/A 08/05/2023   Procedure: BIOPSY, LIVER;  Surgeon: Kallie Manuelita BROCKS, MD;  Location: [facility];  Service: General;  Laterality: N/A;   right ankle repair     age 33   UMBILICAL HERNIA REPAIR N/A 08/05/2023   Procedure: REPAIR, HERNIA, UMBILICAL, ADULT;  Surgeon: Kallie Manuelita BROCKS, MD;  Location: [facility];  Service: General;  Laterality: N/A;   Family History:  Family History  Problem Relation Age of Onset   Cancer Mother  Leukemia Mother    Multiple myeloma Mother    Cancer Father        unsure what kind   Cancer - Colon Neg Hx    Colon polyps Neg Hx    Social History:  Social History   Tobacco Use   Smoking status: Every Day    Current packs/day: 0.50    Average packs/day: 0.7 packs/day for 38.5 years (28.5 ttl pk-yrs)    Types: Cigarettes    Start date: 03/2022   Smokeless tobacco: Never  Vaping Use   Vaping status: Never Used  Substance Use Topics   Alcohol use: Yes    Alcohol/week: 2.0 standard drinks of alcohol    Types: 2 Cans of beer per week    Comment:  beer,2-3 beers in a weeks time   Drug use: Never    Home Medications:  Prior to Admission medications   Medication Sig Start Date End Date Taking? Authorizing Provider  bisacodyl  (DULCOLAX) 10 MG suppository Place 1 suppository (10 mg total) rectally daily as needed for moderate constipation. 08/14/23   Vicci Afton CROME, MD  Elastic Bandages & Supports (FITRITE BACK BRACE WITH PULLEY) MISC 1 Units by Does not apply route daily. 05/25/23   Bevely Doffing, FNP  hydrocortisone  (ANUSOL -HC) 25 MG suppository Place 1 suppository (25 mg total) rectally 2 (two) times daily. 08/22/23   Pearlean Manus, MD  methocarbamol  (ROBAXIN ) 500 MG tablet Take 1 tablet (500 mg total) by mouth every 8 (eight) hours as needed for muscle spasms. 08/14/23   Vicci Afton CROME, MD  Misc. Devices MISC Closed toe walking boot for right foot toe fractures Dx: D07.698 05/25/23   Bevely Doffing, FNP  omeprazole  (PRILOSEC) 40 MG capsule Take 1 capsule (40 mg total) by mouth 2 (two) times daily. 08/22/23   Pearlean Manus, MD  ondansetron  (ZOFRAN -ODT) 4 MG disintegrating tablet Take 1 tablet (4 mg total) by mouth every 8 (eight) hours as needed for nausea or vomiting. 08/22/23   Pearlean Manus, MD  oxyCODONE  (OXY IR/ROXICODONE ) 5 MG immediate release tablet Take 1 tablet (5 mg total) by mouth every 6 (six) hours as needed for severe pain (pain score 7-10) or breakthrough pain. 08/22/23   Kallie Manuelita BROCKS, MD  phenol (CHLORASEPTIC) 1.4 % LIQD Use as directed 1 spray in the mouth or throat as needed for throat irritation / pain. 08/14/23   Vicci Afton CROME, MD  Potassium Chloride  ER 20 MEQ TBCR Take 1 tablet (20 mEq total) by mouth daily for 5 days. 1 tab daily by mouth--- 08/22/23 08/27/23  Pearlean Manus, MD  sertraline  (ZOLOFT ) 50 MG tablet Take 1 tablet (50 mg total) by mouth daily. 08/22/23   Pearlean Manus, MD  simethicone  (MYLICON) 80 MG chewable tablet Chew 1 tablet (80 mg total) by mouth 4 (four) times daily as needed for  flatulence. 08/14/23   Clary, Clanford L, MD  sucralfate  (CARAFATE ) 1 g tablet Take 1 tablet (1 g total) by mouth 4 (four) times daily -  with meals and at bedtime for 10 days. 08/22/23 09/01/23  Pearlean Manus, MD  traZODone  (DESYREL ) 50 MG tablet Take 1 tablet (50 mg total) by mouth at bedtime as needed for sleep. 08/22/23   Pearlean Manus, MD  vancomycin  (VANCOCIN ) 125 MG capsule Take 1 capsule (125 mg total) by mouth 4 (four) times daily for 10 days. 08/22/23 09/01/23  Pearlean Manus, MD  Vitamin D , Ergocalciferol , (DRISDOL ) 1.25 MG (50000 UNIT) CAPS capsule Take 1 capsule (50,000 Units total) by  mouth every 7 (seven) days. 08/22/23   Pearlean Manus, MD    Inpatient Medications:  Current Facility-Administered Medications:    morphine  (PF) 2 MG/ML injection 2 mg, 2 mg, Intravenous, Q4H PRN, Adefeso, Oladapo, DO, 2 mg at 09/01/23 0802   ondansetron  (ZOFRAN ) tablet 4 mg, 4 mg, Oral, Q6H PRN **OR** ondansetron  (ZOFRAN ) injection 4 mg, 4 mg, Intravenous, Q6H PRN, Adefeso, Oladapo, DO, 4 mg at 09/01/23 0807   pantoprazole  (PROTONIX ) injection 40 mg, 40 mg, Intravenous, Q12H, Adefeso, Oladapo, DO, 40 mg at 09/01/23 0803   potassium chloride  10 mEq in 100 mL IVPB, 10 mEq, Intravenous, Q1 Hr x 6, Hongalgi, Anand D, MD, Last Rate: 100 mL/hr at 09/01/23 0914, 10 mEq at 09/01/23 9085   sodium chloride  0.9 % 1,000 mL with potassium chloride  40 mEq infusion, , Intravenous, Continuous, Hongalgi, Anand D, MD   sucralfate  (CARAFATE ) 1 GM/10ML suspension 1 g, 1 g, Oral, TID WC & HS, Hongalgi, Anand D, MD Allergies: Bee venom, Fire ant, and Latex  Complete Review of Systems: GENERAL: negative for malaise, night sweats HEENT: No changes in hearing or vision, no nose bleeds or other nasal problems. NECK: Negative for lumps, goiter, pain and significant neck swelling RESPIRATORY: Negative for cough, wheezing CARDIOVASCULAR: Negative for chest pain, leg swelling, palpitations, orthopnea GI: SEE  HPI MUSCULOSKELETAL: Negative for joint pain or swelling, back pain, and muscle pain. SKIN: Negative for lesions, rash PSYCH: Negative for sleep disturbance, mood disorder and recent psychosocial stressors. HEMATOLOGY Negative for prolonged bleeding, bruising easily, and swollen nodes. ENDOCRINE: Negative for cold or heat intolerance, polyuria, polydipsia and goiter. NEURO: negative for tremor, gait imbalance, syncope and seizures. The remainder of the review of systems is noncontributory.  Physical Exam: BP 117/85 (BP Location: Right Arm)   Pulse (!) 103   Temp 99.5 F (37.5 C) (Oral)   Resp 18   Ht 5' 2.01 (1.575 m)   Wt 58.2 kg   SpO2 98%   BMI 23.46 kg/m  GENERAL: The patient is AO x3, in no acute distress. HEENT: Head is normocephalic and atraumatic. EOMI are intact. Mouth is well hydrated and without lesions. NECK: Supple. No masses LUNGS: Clear to auscultation. No presence of rhonchi/wheezing/rales. Adequate chest expansion HEART: RRR, normal s1 and s2. ABDOMEN: Tender upon palpation diffusely but worse in the upper abdomen, no guarding, no peritoneal signs, and nondistended. BS +. No masses.  There is presence of midline scar. EXTREMITIES: Without any cyanosis, clubbing, rash, lesions or edema. NEUROLOGIC: AOx3, no focal motor deficit. SKIN: no jaundice, no rashes  Laboratory Data CBC:     Component Value Date/Time   WBC 4.4 09/01/2023 0600   RBC 3.00 (L) 09/01/2023 0600   HGB 8.9 (L) 09/01/2023 0600   HGB 9.4 (L) 07/30/2023 0939   HCT 26.8 (L) 09/01/2023 0600   HCT 29.1 (L) 07/30/2023 0939   PLT 129 (L) 09/01/2023 0600   PLT 130 (L) 07/30/2023 0939   MCV 89.3 09/01/2023 0600   MCV 89 07/30/2023 0939   MCH 29.7 09/01/2023 0600   MCHC 33.2 09/01/2023 0600   RDW 18.5 (H) 09/01/2023 0600   RDW 15.8 (H) 07/30/2023 0939   LYMPHSABS 1.3 08/31/2023 2152   LYMPHSABS 1.5 07/30/2023 0939   MONOABS 0.7 08/31/2023 2152   EOSABS 0.0 08/31/2023 2152   EOSABS 0.1  07/30/2023 0939   BASOSABS 0.0 08/31/2023 2152   BASOSABS 0.0 07/30/2023 0939   COAG:  Lab Results  Component Value Date   INR 1.3 (H)  08/21/2023   INR 1.3 (H) 08/20/2023   INR 1.5 (H) 08/14/2023    BMP:     Latest Ref Rng & Units 09/01/2023    6:00 AM 09/01/2023   12:47 AM 08/31/2023   10:40 PM  BMP  Glucose 70 - 99 mg/dL 74   81   BUN 6 - 20 mg/dL <5   <3   Creatinine 9.55 - 1.00 mg/dL 9.63   9.39   Sodium 864 - 145 mmol/L 130   128   Potassium 3.5 - 5.1 mmol/L 2.9  2.9  2.5   Chloride 98 - 111 mmol/L 93   87   CO2 22 - 32 mmol/L 26     Calcium  8.9 - 10.3 mg/dL 7.4       HEPATIC:     Latest Ref Rng & Units 09/01/2023    6:00 AM 08/31/2023    9:52 PM 08/22/2023    5:19 AM  Hepatic Function  Total Protein 6.5 - 8.1 g/dL 6.0  7.2  5.4   Albumin 3.5 - 5.0 g/dL 2.1  2.5  1.9   AST 15 - 41 U/L 43  56  78   ALT 0 - 44 U/L 18  22  32   Alk Phosphatase 38 - 126 U/L 63  76  66   Total Bilirubin 0.0 - 1.2 mg/dL 1.4  1.5  1.0     CARDIAC: No results found for: CKTOTAL, CKMB, CKMBINDEX, TROPONINI   Imaging: I personally reviewed and interpreted the available imaging.  Assessment & Plan: 53 y.o. female with history of SVT, alcoholic cirrhosis, recent perforated gastric ulcer status post Arlyss patch, anxiety, GERD, sleep apnea, SVT, history of alcohol abuse, who came to the hospital for evaluation of recurrent episodes of diarrhea, nausea and vomiting.  Patient has presented acute onset of nausea, vomiting and diarrhea episodes with significant electrolyte derangements on admission.  Given the acute onset of her symptoms, infectious etiologies will need to be rule out with stool based testing.  Can slowly advance her diet to soft diet for now and avoiding lactose-based products.  Will also recommend continuing with Zofran  as needed for now.  Patient has a complicated history of perforated gastric ulcer that required surgical intervention.  Presented mild drop in her  hemoglobin but no episodes of melena.  She will need to continue with PPI twice daily for now.  I would not recommend pursuing an endoscopic evaluation unless history necessary given how recent her surgical intervention was.  May consider an eventual outpatient EGD and colonoscopy.,  Possibly 2 months from now if possible.  Finally, I had a very thorough conversation with the patient regarding the importance of avoiding alcohol as this may lead to further progression of her chronic liver disease.  She is not presenting any significant third spacing at the moment or hepatic encephalopathy.  - Repeat CBC qday, transfuse if Hb <7 - Pantoprazole  40 mg q12h IVP -Sucralfate  1 g every 6 hours - 2 large bore IV lines - Active T/S - Advance diet to GI soft diet, no lactose - Avoid NSAIDs - Check C. difficile and GI pathogen panel - Zofran  as needed for nausea - Consider outpatient EGD and colonoscopy 2 months from now - Alcohol cessation   Toribio Fortune, MD Gastroenterology and Hepatology [facility]

## 2023-09-01 NOTE — Progress Notes (Signed)
 Patient missed her follow-up appointment with Dr. Kallie for staple removal.  Her midline incision is well-healed.  Staples were removed.  Sutures from old JP site removed.

## 2023-09-01 NOTE — Plan of Care (Signed)
  Problem: Education: Goal: Knowledge of General Education information will improve Description: Including pain rating scale, medication(s)/side effects and non-pharmacologic comfort measures Outcome: Progressing   Problem: Health Behavior/Discharge Planning: Goal: Ability to manage health-related needs will improve Outcome: Not Progressing   Problem: Clinical Measurements: Goal: Ability to maintain clinical measurements within normal limits will improve Outcome: Progressing Goal: Will remain free from infection Outcome: Progressing Goal: Diagnostic test results will improve Outcome: Progressing Goal: Respiratory complications will improve Outcome: Progressing Goal: Cardiovascular complication will be avoided Outcome: Progressing   Problem: Activity: Goal: Risk for activity intolerance will decrease Outcome: Not Progressing   Problem: Nutrition: Goal: Adequate nutrition will be maintained Outcome: Progressing   Problem: Coping: Goal: Level of anxiety will decrease Outcome: Progressing   Problem: Elimination: Goal: Will not experience complications related to bowel motility Outcome: Progressing Goal: Will not experience complications related to urinary retention Outcome: Progressing   Problem: Pain Managment: Goal: General experience of comfort will improve and/or be controlled Outcome: Progressing   Problem: Safety: Goal: Ability to remain free from injury will improve Outcome: Progressing   Problem: Skin Integrity: Goal: Risk for impaired skin integrity will decrease Outcome: Progressing

## 2023-09-02 DIAGNOSIS — K922 Gastrointestinal hemorrhage, unspecified: Secondary | ICD-10-CM | POA: Diagnosis not present

## 2023-09-02 LAB — CBC
HCT: 26.6 % — ABNORMAL LOW (ref 36.0–46.0)
Hemoglobin: 8.8 g/dL — ABNORMAL LOW (ref 12.0–15.0)
MCH: 30.2 pg (ref 26.0–34.0)
MCHC: 33.1 g/dL (ref 30.0–36.0)
MCV: 91.4 fL (ref 80.0–100.0)
Platelets: 121 K/uL — ABNORMAL LOW (ref 150–400)
RBC: 2.91 MIL/uL — ABNORMAL LOW (ref 3.87–5.11)
RDW: 19.2 % — ABNORMAL HIGH (ref 11.5–15.5)
WBC: 4.2 K/uL (ref 4.0–10.5)
nRBC: 0 % (ref 0.0–0.2)

## 2023-09-02 LAB — COMPREHENSIVE METABOLIC PANEL WITH GFR
ALT: 16 U/L (ref 0–44)
AST: 41 U/L (ref 15–41)
Albumin: 1.9 g/dL — ABNORMAL LOW (ref 3.5–5.0)
Alkaline Phosphatase: 56 U/L (ref 38–126)
Anion gap: 5 (ref 5–15)
BUN: <5 mg/dL — ABNORMAL LOW (ref 6–20)
CO2: 24 mmol/L (ref 22–32)
Calcium: 7.6 mg/dL — ABNORMAL LOW (ref 8.9–10.3)
Chloride: 100 mmol/L (ref 98–111)
Creatinine, Ser: 0.47 mg/dL (ref 0.44–1.00)
GFR, Estimated: >60 mL/min (ref >60)
Glucose, Bld: 89 mg/dL (ref 70–99)
Potassium: 3.8 mmol/L (ref 3.5–5.1)
Sodium: 129 mmol/L — ABNORMAL LOW (ref 135–145)
Total Bilirubin: 1.1 mg/dL (ref 0.0–1.2)
Total Protein: 5.7 g/dL — ABNORMAL LOW (ref 6.5–8.1)

## 2023-09-02 LAB — MAGNESIUM: Magnesium: 1.6 mg/dL — ABNORMAL LOW (ref 1.7–2.4)

## 2023-09-02 MED ORDER — MAGNESIUM SULFATE 4 GM/100ML IV SOLN
4.0000 g | Freq: Once | INTRAVENOUS | Status: AC
  Administered 2023-09-02: 4 g via INTRAVENOUS
  Filled 2023-09-02: qty 100

## 2023-09-02 MED ORDER — HYDROXYZINE HCL 10 MG PO TABS
10.0000 mg | ORAL_TABLET | Freq: Once | ORAL | Status: AC | PRN
  Administered 2023-09-02: 10 mg via ORAL
  Filled 2023-09-02: qty 1

## 2023-09-02 NOTE — Plan of Care (Signed)
  Problem: Acute Rehab PT Goals(only PT should resolve) Goal: Pt Will Go Supine/Side To Sit Outcome: Progressing Flowsheets (Taken 09/02/2023 1016) Pt will go Supine/Side to Sit: with supervision Goal: Patient Will Transfer Sit To/From Stand Outcome: Progressing Flowsheets (Taken 09/02/2023 1016) Patient will transfer sit to/from stand: with supervision Goal: Pt Will Transfer Bed To Chair/Chair To Bed Outcome: Progressing Flowsheets (Taken 09/02/2023 1016) Pt will Transfer Bed to Chair/Chair to Bed: with supervision Goal: Pt Will Ambulate Outcome: Progressing Flowsheets (Taken 09/02/2023 1016) Pt will Ambulate:  50 feet  with rolling walker  with supervision   Problem: Acute Rehab PT Goals(only PT should resolve) Goal: Pt Will Go Up/Down Stairs Flowsheets (Taken 09/02/2023 1016) Pt will Go Up / Down Stairs:  3-5 stairs  with contact guard assist  with supervision  with rail(s)   10:16 AM, 09/02/23 Rosaria Settler, PT, DPT [place] with [facility]

## 2023-09-02 NOTE — Progress Notes (Signed)
 PROGRESS NOTE   Joan Wood  FMW:[id number]    DOB: Aug 02, 1970    DOA: 08/31/2023  PCP: Zarwolo, Gloria, FNP   I have briefly reviewed patients previous medical records in [facility].   Brief Hospital Course:  53 year old female, lives with her roommate, ambulates with the help of a cane, medical history significant for s/p exploratory laparotomy for perforated gastric ulcer repair with omental patch on 7/27, ongoing alcohol use disorder, tobacco abuse, cirrhosis, HTN, GERD, anxiety/depression, multiple hospitalizations (since her sixth hospital admission this year), presented to the ED with complaints of couple days of pain across upper abdomen, severe, associated with multiple episodes of nonbloody emesis and few episodes of black tarry stools in the context of ongoing alcohol use, drank about half a can of a 12 ounce beer on the morning of admission.  Denies NSAID use.  Admitted for nausea, vomiting, abdominal pain, possible upper GI bleed, multiple electrolyte abnormalities.  Clinically improving.   Assessment & Plan:   Nausea, vomiting, abdominal pain, possible acute upper GI bleed Hemodynamically stable. FOBT positive. CT A/P with contrast 8/22: Resolution of previously seen free air.  No findings to suggest perforation at this time.  Persistent ulceration within the gastric wall related to prior perforated ulcer with repair. GI consulted.  Discussed with Dr. Eartha 8/23 who recommends no endoscopy at this time, advance to soft diet, twice daily IV PPI, sucralfate . Lipase minimally elevated at 82, unclear significance but would not change supportive care management. Do not really think that she has diarrhea.  Discontinued contact isolation and stool testing.  No BM in more than 24 hours.  Chronic GI blood loss anemia Hemoglobin on admission 10.1. Hemoglobin fluctuating, overall appears stable/8.8 g per DL, close to her recent baseline on 8/13.  Follow CBC in AM.  Severe  hypokalemia Secondary to GI losses and poor oral intake Replaced.  Hypomagnesemia Continue to replace aggressively IV and follow.  Elevated lactate Secondary to cirrhosis and hypoperfusion from GI losses IV fluids and resolved.  Hyponatremia, chronic Secondary to alcohol/beer use, cirrhosis, intravascular volume depletion from GI losses and poor oral intake Presented with serum sodium of 125. Improved and stable with serum sodiums fluctuating in the high 120s-130.  Alcohol use disorder Blood alcohol level 139 on admission. Absolute abstinence counseled CIWA protocol, scores 0.  No overt withdrawal.  Tobacco use disorder Absolute cessation counseled Patient declines nicotine  patch  Thrombocytopenia Chronic and intermittent secondary to cirrhosis and alcohol use.  Stable.  Cirrhosis No clinical ascites.  INR 1.3. Liver biopsy from 08/05/2023: Pathology shows mild to moderately active steatohepatitis with cirrhosis.  S/p exploratory laparotomy 7/27 Still had staples in place which were removed on 8/23.  Outpatient follow-up with general surgery.  Body mass index is 23.46 kg/m.   DVT prophylaxis: SCDs Start: 09/01/23 0114     Code Status: Full Code:  Family Communication: None at bedside Disposition:  Gradually improving.  Monitor closely and pending further improvement and clinical stability, possible DC tomorrow  Therapies had recommended SNF but patient absolutely declining SNF and insists on discharging home.     Consultants:   GI  Procedures:     Subjective:  Abdominal pain is better.  Still has significant nausea but no vomiting and tolerating diet.  No BM in more than 24 hours.  Objective:   Vitals:   09/01/23 1729 09/01/23 1928 09/02/23 0620 09/02/23 1308  BP: 111/81 112/74 111/82 100/63  Pulse: (!) 105 (!) 105 92 98  Resp:  18 18 16   Temp: 98.6 F (37 C) 97.9 F (36.6 C) 98.3 F (36.8 C) 98.7 F (37.1 C)  TempSrc: Oral Oral Oral Oral  SpO2:  98% 97% 95% 95%  Weight:      Height:        General exam: Young female, moderately built, frail, chronically ill looking lying comfortably propped up in bed without distress.  Oral mucosa moist Respiratory system: Clear to auscultation. Respiratory effort normal. Cardiovascular system: S1 & S2 heard, RRR. No JVD, murmurs, rubs, gallops or clicks. No pedal edema.  Telemetry personally reviewed: Sinus rhythm. Gastrointestinal system: Abdomen is mildly distended, soft, voluntary guarding with mild tenderness without rigidity or rebound.  No organomegaly or masses appreciated.  No significant clinical ascites.  Normal bowel sounds heard. Central nervous system: Alert and oriented. No focal neurological deficits. Extremities: Symmetric 5 x 5 power. Skin: No rashes, lesions or ulcers Psychiatry: Judgement and insight appear normal. Mood & affect appropriate.     Data Reviewed:   I have personally reviewed following labs and imaging studies   CBC: Recent Labs  Lab 08/31/23 2152 08/31/23 2240 09/01/23 0600 09/01/23 1543 09/02/23 0448  WBC 5.9  --  4.4 5.9 4.2  NEUTROABS 3.8  --   --   --   --   HGB 10.1*   < > 8.9* 9.7* 8.8*  HCT 30.3*   < > 26.8* 29.6* 26.6*  MCV 88.3  --  89.3 91.4 91.4  PLT 148*  --  129* 125* 121*   < > = values in this interval not displayed.    Basic Metabolic Panel: Recent Labs  Lab 08/31/23 2152 08/31/23 2240 09/01/23 0047 09/01/23 0600 09/01/23 1543 09/02/23 0448  NA 125* 128*  --  130* 128* 129*  K 2.6* 2.5* 2.9* 2.9* 4.8 3.8  CL 86* 87*  --  93* 95* 100  CO2 24  --   --  26 23 24   GLUCOSE 86 81  --  74 77 89  BUN <5* <3*  --  <5* <5* <5*  CREATININE 0.39* 0.60  --  0.36* 0.42* 0.47  CALCIUM  8.0*  --   --  7.4* 7.7* 7.6*  MG 1.6*  --   --  1.8  --  1.6*    Liver Function Tests: Recent Labs  Lab 08/31/23 2152 09/01/23 0600 09/02/23 0448  AST 56* 43* 41  ALT 22 18 16   ALKPHOS 76 63 56  BILITOT 1.5* 1.4* 1.1  PROT 7.2 6.0* 5.7*   ALBUMIN 2.5* 2.1* 1.9*    CBG: No results for input(s): GLUCAP in the last 168 hours.  Microbiology Studies:  No results found for this or any previous visit (from the past 240 hours).  Radiology Studies:  CT ABDOMEN PELVIS W CONTRAST Result Date: 08/31/2023 CLINICAL DATA:  Acute abdominal pain, history of prior gastric ulcer perforation EXAM: CT ABDOMEN AND PELVIS WITH CONTRAST TECHNIQUE: Multidetector CT imaging of the abdomen and pelvis was performed using the standard protocol following bolus administration of intravenous contrast. RADIATION DOSE REDUCTION: This exam was performed according to the departmental dose-optimization program which includes automated exposure control, adjustment of the mA and/or kV according to patient size and/or use of iterative reconstruction technique. CONTRAST:  OMNIPAQUE  IOHEXOL  300 MG/ML  SOLN COMPARISON:  08/19/2023 FINDINGS: Lower chest: Lung bases are free of acute infiltrate or sizable effusion. Hepatobiliary: Liver is diffusely decreased in attenuation. Gallbladder is partially distended. Perihepatic fluid is noted stable from the prior  exam. Pancreas: Pancreas is well visualized and within normal limits. Spleen: Normal in size without focal abnormality. Minimal perisplenic fluid is seen. Adrenals/Urinary Tract: Adrenal glands are within normal limits. Kidneys demonstrate normal enhancement bilaterally. No renal calculi or obstructive changes are seen. The bladder is well distended. Stomach/Bowel: No obstructive or inflammatory changes of the colon are seen. The appendix is not well visualized. Small bowel is within normal limits. There remains a large air-filled defect of the gastric wall consistent with the known previous perforated ulcer with subsequent repair. This is stable from the prior exam. The previously seen free air in the left upper quadrant is no longer identified. The previously seen surgical drain has been removed in the interval.  Vascular/Lymphatic: Aortic atherosclerosis. No enlarged abdominal or pelvic lymph nodes. Reproductive: Uterus and bilateral adnexa are unremarkable. Other: No abdominal wall hernia or abnormality. No abdominopelvic ascites. Musculoskeletal: No acute or significant osseous findings. IMPRESSION: Minimal ascites similar to that seen on the prior exam. Resolution of previously seen free air. No findings to suggest perforation are noted at this time. Persistent ulceration within the gastric wall related to prior perforated ulcer with repair. No evidence of perforation is seen at this time. Electronically Signed   By: Oneil Devonshire M.D.   On: 08/31/2023 23:16    Scheduled Meds:    pantoprazole  (PROTONIX ) IV  40 mg Intravenous Q12H   sucralfate   1 g Oral TID WC & HS    Continuous Infusions:       LOS: 1 day     Trenda Mar, MD,  FACP, [facility], [facility], [facility]   [facility] Ad[facility] [place]      To contact the attending provider between 7A-7P or the covering provider during after hours 7P-7A, please log into the web site [email or web address] and access using universal [place] password for that web site. If you do not have the password, please call the hospital operator.  09/02/2023, 3:39 PM

## 2023-09-02 NOTE — Plan of Care (Signed)

## 2023-09-02 NOTE — Evaluation (Addendum)
 Physical Therapy Evaluation Patient Details Name: Joan Wood MRN: [id number] DOB: June 12, 1970 Today's Date: 09/02/2023  History of Present Illness  Joan Wood is a 53 y.o. female with medical history significant of hypertension, GERD, recent exploratory laparotomy with omental patch and liver biopsy for gastric ulcer back on 08/05/2023 and was readmitted on 08/20/23 with abdominal pain and concerns for possible free air.  She presented to the emergency department due to 6 to 7-day onset of upper abdominal pain which was described as sharp stabbing pain rated as 10/10 on pain scale, she also complained of 6-day onset of nonbilious, nonbloody vomiting of about 4 episodes daily and 5-day onset of several episodes of black stools.  She endorsed to drinking about half can of 12 ounce beer yesterday prior to activating EMS.  She denies use of NSAIDs.   Clinical Impression  Patient agreeable to PT evaluation. Patient was received supine in bed. On this date, patient required min assist for supine to sit, supervision/CGA for sit to supine, CGA for transfers and ambulation with RW, and min assist with transfers and ambulation without RW. Patient is limited most due to abdominal pain/nausea, general weakness, decreased endurance, and impaired balance. Patient reports no assistance during the week for ADLs and iADLs which she reports having increased difficulty with. Patient will benefit from continued skilled physical therapy acutely and in recommended venue in order to address current deficits prior to returning home for maximum safety and function. Patient returned to bed at end of session, call button within reach, and tray set up to eat.        If plan is discharge home, recommend the following: A little help with walking and/or transfers;Help with stairs or ramp for entrance;Assistance with cooking/housework;A little help with bathing/dressing/bathroom;Assist for transportation   Can travel by private  vehicle        Equipment Recommendations None recommended by PT  Recommendations for Other Services  OT consult    Functional Status Assessment Patient has had a recent decline in their functional status and demonstrates the ability to make significant improvements in function in a reasonable and predictable amount of time.     Precautions / Restrictions Precautions Precautions: Fall Recall of Precautions/Restrictions: Intact Restrictions Weight Bearing Restrictions Per Provider Order: No      Mobility  Bed Mobility Overal bed mobility: Needs Assistance Bed Mobility: Supine to Sit, Sit to Supine     Supine to sit: Contact guard, Min assist Sit to supine: Contact guard assist   General bed mobility comments: HOB slightly elevated, pt with intermiteent use of railings, slow labored movement, mild min assist for trunk elevation from [facility] due to core weakness    Transfers Overall transfer level: Needs assistance Equipment used: Rolling walker (2 wheels), None Transfers: Sit to/from Stand Sit to Stand: Min assist, Contact guard assist     General transfer comment: Pt demo slow labored movement, uses bed railing on R side to push to stand and slight min A through gait belt, pt immediately reaches for wall once standing due to unsteadiness    Ambulation/Gait Ambulation/Gait assistance: Min assist, Contact guard assist Gait Distance (Feet): 20 Feet Assistive device: Rolling walker (2 wheels), None Gait Pattern/deviations: Step-through pattern, Drifts right/left, Decreased step length - right, Decreased step length - left Gait velocity: decreased     General Gait Details: limited to ambulation in room due to pt pain and fatigue. Ambulation without RW requires min assist due to unsteadiness, pt using support of  wall, sink, and door for support. Improvements noted with use of RW, requiring CGA.  Stairs            Wheelchair Mobility     Tilt Bed    Modified Rankin  (Stroke Patients Only)       Balance Overall balance assessment: Needs assistance Sitting-balance support: No upper extremity supported, Feet supported Sitting balance-Leahy Scale: Good Sitting balance - Comments: Seated EOB   Standing balance support: During functional activity, No upper extremity supported, Bilateral upper extremity supported Standing balance-Leahy Scale: Fair Standing balance comment: w/ and w/o RW           Pertinent Vitals/Pain Pain Assessment Pain Assessment: 0-10 Pain Score: 5  Pain Location: Abdomen Pain Descriptors / Indicators: Sharp, Other (Comment) (nauseating) Pain Intervention(s): Limited activity within patient's tolerance, Repositioned, Monitored during session    Home Living Family/patient expects to be discharged to:: Private residence Living Arrangements: Non-relatives/Friends (Female roommate) Available Help at Discharge: Friend(s);Available PRN/intermittently Type of Home: House Home Access: Stairs to enter Entrance Stairs-Rails: Right Entrance Stairs-Number of Steps: 5   Home Layout: One level Home Equipment: Agricultural consultant (2 wheels);Wheelchair - manual;Crutches Additional Comments: Pt reports she lives with a female roommate who is a Naval architect and gone throughout the week but available on the weekends.    Prior Function Prior Level of Function : Independent/Modified Independent;History of Falls (last six months)             Mobility Comments: Pt reports she is mostly a household ambulator and uses furniture and walls for stability. Reports use of w/c some days when in more pain while doing iADLs (laundry). Reports little ambulation outside of home recently ADLs Comments: Pt reports she can dress herself but has not performed bathing (in tub or sponge bathing in bed or at sink) in a long time due to pain, recent surgical site and difficulty of task. reports she does not do any iADLs other than laundry, in which she sits in her w/c  while performing task     Extremity/Trunk Assessment   Upper Extremity Assessment Upper Extremity Assessment: Generalized weakness (Shoulder flexion AROM WFL bilaterally, MMT 3+/5 bilaterally)    Lower Extremity Assessment Lower Extremity Assessment: Generalized weakness (Ankle DF MMT 4/5 bilat, hip flexion MMT 3+/5 with pt reporting pain in thigh area bilaterally)    Cervical / Trunk Assessment Cervical / Trunk Assessment: Normal  Communication   Communication Communication: No apparent difficulties    Cognition Arousal: Alert Behavior During Therapy: WFL for tasks assessed/performed   PT - Cognitive impairments: No apparent impairments   Following commands: Intact       Cueing Cueing Techniques: Verbal cues, Tactile cues, Visual cues     General Comments      Exercises     Assessment/Plan    PT Assessment Patient needs continued PT services;All further PT needs can be met in the next venue of care  PT Problem List Decreased strength;Decreased balance;Decreased mobility;Decreased activity tolerance;Pain       PT Treatment Interventions Therapeutic activities;Gait training;Therapeutic exercise;DME instruction;Patient/family education;Stair training;Balance training;Functional mobility training    PT Goals (Current goals can be found in the Care Plan section)  Acute Rehab PT Goals Patient Stated Goal: Return home PT Goal Formulation: With patient Time For Goal Achievement: 09/17/23 Potential to Achieve Goals: Good    Frequency Min 3X/week     Co-evaluation               AM-PAC PT 6  Clicks Mobility  Outcome Measure Help needed turning from your back to your side while in a flat bed without using bedrails?: A Little Help needed moving from lying on your back to sitting on the side of a flat bed without using bedrails?: A Little Help needed moving to and from a bed to a chair (including a wheelchair)?: A Little Help needed standing up from a chair  using your arms (e.g., wheelchair or bedside chair)?: A Little Help needed to walk in hospital room?: A Little Help needed climbing 3-5 steps with a railing? : A Lot 6 Click Score: 17    End of Session Equipment Utilized During Treatment: Gait belt Activity Tolerance: Patient limited by fatigue;Patient limited by pain Patient left: in bed;with call bell/phone within reach   PT Visit Diagnosis: Unsteadiness on feet (R26.81);Muscle weakness (generalized) (M62.81);Other abnormalities of gait and mobility (R26.89);Pain Pain - part of body:  (Abdomen)    Time: 9185-9158 PT Time Calculation (min) (ACUTE ONLY): 27 min   Charges:   PT Evaluation $PT Eval Moderate Complexity: 1 Mod   PT General Charges $$ ACUTE PT VISIT: 1 Visit         10:15 AM, 09/02/23 Rosaria Settler, PT, DPT [place] with [facility]

## 2023-09-02 NOTE — TOC Initial Note (Signed)
 Transition of Care [facility]) - Initial/Assessment Note    Patient Details  Name: Joan Wood MRN: [id number] Date of Birth: February 06, 1970  [facility] CM/SW Contact:    Nena LITTIE Coffee, RN Phone Number: 09/02/2023, 9:05 AM  Clinical Narrative:                 Pt assessed for high readmission score and recent admissions.  Pt has hx of malingering, alcohol use, cirrhosis, pancreatitis, colitis, gastritis, malnutrition, depression.  On August 05, 2023 pt had an exploratory laparotomy, gastrorrhaphy, omental patch, liver biopsy, and umbilical hernia repair c/Dr. Kallie and discharged home on 08/14/2023. Since then she has returned twice c/same complaints of n/v/d, abd pain. Pt lives c/a roommate who is a Naval architect and not at home Monday-Friday. She has no family in the local area and she is not interested in relocating. She is independent at home and has a cane, walker and wheelchair. At last admission pt discharged home s/services.  TOC was unable to find a HH agency to accept pt's insurance.   Expected Discharge Plan: Home/Self Care Barriers to Discharge: Continued Medical Work up  Patient Goals and CMS Choice   Expected Discharge Plan and Services In-house Referral: Clinical Social Work Discharge Planning Services: CM Consult   Living arrangements for the past 2 months: Single Family Home      Prior Living Arrangements/Services Living arrangements for the past 2 months: Single Family Home Lives with:: Roommate Patient language and need for interpreter reviewed:: Yes Do you feel safe going back to the place where you live?: Yes      Need for Family Participation in Patient Care: No (Comment) Care giver support system in place?: No (comment) Current home services: DME (cane, walker, wheelchair) Criminal Activity/Legal Involvement Pertinent to Current Situation/Hospitalization: No - Comment as needed  Activities of Daily Living   ADL Screening (condition at time of  admission) Independently performs ADLs?: No Does the patient have a NEW difficulty with bathing/dressing/toileting/self-feeding that is expected to last >3 days?: No Does the patient have a NEW difficulty with getting in/out of bed, walking, or climbing stairs that is expected to last >3 days?: No Does the patient have a NEW difficulty with communication that is expected to last >3 days?: No Is the patient deaf or have difficulty hearing?: No Does the patient have difficulty seeing, even when wearing glasses/contacts?: No Does the patient have difficulty concentrating, remembering, or making decisions?: No  Permission Sought/Granted Permission sought to share information with : Case Manager, Magazine features editor Permission granted to share information with : Yes, Verbal Permission Granted    Emotional Assessment Appearance:: Appears stated age Attitude/Demeanor/Rapport: Engaged Affect (typically observed): Calm Orientation: : Oriented to Self, Oriented to Place, Oriented to  Time, Oriented to Situation Alcohol / Substance Use: Alcohol Use Psych Involvement: No (comment)  Admission diagnosis:  Hypokalemia [E87.6] Pain of upper abdomen [R10.10] Abdominal pain [R10.9] Gastrointestinal hemorrhage, unspecified gastrointestinal hemorrhage type [K92.2] Patient Active Problem List   Diagnosis Date Noted   GI bleed 09/01/2023   Lactic acidosis 09/01/2023   Elevated lipase 09/01/2023   Liver cirrhosis (HCC) 09/01/2023   Colitis 08/28/2023   Hematochezia 08/22/2023   History of gastric ulcer 08/22/2023   Abdominal pain 08/20/2023   Liver lesion 08/14/2023   Malnutrition of moderate degree 08/08/2023   ABLA (acute blood loss anemia) 08/08/2023   Pneumoperitoneum 08/05/2023   Perforated abdominal viscus 08/05/2023   Syncope 07/30/2023   Depression, recurrent (HCC) 07/30/2023  Generalized abdominal pain 06/04/2023   Nausea vomiting and diarrhea 06/04/2023   Enteritis  05/28/2023   Intractable vomiting 05/28/2023   Perforated ulcer (HCC) 05/27/2023   Alcoholic cirrhosis (HCC) 05/27/2023   Hypomagnesemia 05/17/2023   Hyponatremia 05/15/2023   Tobacco abuse 05/15/2023   Acute hyponatremia 05/11/2023   Pancreatitis 05/11/2023   Gastritis 05/11/2023   Hypokalemia 05/11/2023   Multiple closed fractures of metatarsal bone with routine healing, right 04/24/2023   Neuropathy 03/31/2023   Diarrhea 03/31/2023   GERD (gastroesophageal reflux disease) 03/31/2023   Nausea and vomiting 03/31/2023   Alcohol use disorder 03/28/2022   Left hip pain 03/28/2022   Incarcerated umbilical hernia 03/28/2022   Adjustment disorder with mixed anxiety and depressed mood 12/15/2014   Malingering 12/14/2014   PCP:  Zarwolo, Gloria, FNP Pharmacy:   [id number] - [place], [place] - [address] AT [facility] [address] [name] [address] Phone: [phone] Fax: [phone]  Social Drivers of Health (SDOH) Social History: SDOH Screenings   Food Insecurity: No Food Insecurity (09/01/2023)  Housing: Low Risk  (09/01/2023)  Transportation Needs: No Transportation Needs (09/01/2023)  Recent Concern: Transportation Needs - Unmet Transportation Needs (08/20/2023)  Utilities: Not At Risk (09/01/2023)  Depression (PHQ2-9): High Risk (07/30/2023)  Social Connections: Moderately Isolated (09/01/2023)  Tobacco Use: High Risk (09/01/2023)   SDOH Interventions:   Readmission Risk Interventions    09/02/2023    9:01 AM 08/20/2023    8:21 AM 08/11/2023    9:36 AM  Readmission Risk Prevention Plan  Transportation Screening Complete Complete Complete  PCP or Specialist Appt within 3-5 Days Complete    HRI or Home Care Consult Complete Complete Complete  Social Work Consult for Recovery Care Planning/Counseling Complete Complete Complete  Palliative Care Screening Not Applicable Not Applicable Not Applicable  Medication Review Oceanographer) Complete Complete  Complete

## 2023-09-03 ENCOUNTER — Inpatient Hospital Stay (HOSPITAL_COMMUNITY)

## 2023-09-03 ENCOUNTER — Encounter (HOSPITAL_COMMUNITY): Payer: Self-pay | Admitting: Internal Medicine

## 2023-09-03 DIAGNOSIS — E876 Hypokalemia: Secondary | ICD-10-CM | POA: Diagnosis not present

## 2023-09-03 DIAGNOSIS — R109 Unspecified abdominal pain: Secondary | ICD-10-CM

## 2023-09-03 DIAGNOSIS — K529 Noninfective gastroenteritis and colitis, unspecified: Secondary | ICD-10-CM

## 2023-09-03 DIAGNOSIS — K922 Gastrointestinal hemorrhage, unspecified: Secondary | ICD-10-CM | POA: Diagnosis not present

## 2023-09-03 DIAGNOSIS — D649 Anemia, unspecified: Secondary | ICD-10-CM

## 2023-09-03 DIAGNOSIS — K7460 Unspecified cirrhosis of liver: Secondary | ICD-10-CM

## 2023-09-03 DIAGNOSIS — K625 Hemorrhage of anus and rectum: Secondary | ICD-10-CM

## 2023-09-03 DIAGNOSIS — R1110 Vomiting, unspecified: Secondary | ICD-10-CM

## 2023-09-03 DIAGNOSIS — F1010 Alcohol abuse, uncomplicated: Secondary | ICD-10-CM

## 2023-09-03 LAB — COMPREHENSIVE METABOLIC PANEL WITH GFR
ALT: 17 U/L (ref 0–44)
AST: 49 U/L — ABNORMAL HIGH (ref 15–41)
Albumin: 1.8 g/dL — ABNORMAL LOW (ref 3.5–5.0)
Alkaline Phosphatase: 60 U/L (ref 38–126)
Anion gap: 4 — ABNORMAL LOW (ref 5–15)
BUN: <5 mg/dL — ABNORMAL LOW (ref 6–20)
CO2: 25 mmol/L (ref 22–32)
Calcium: 7.7 mg/dL — ABNORMAL LOW (ref 8.9–10.3)
Chloride: 100 mmol/L (ref 98–111)
Creatinine, Ser: 0.43 mg/dL — ABNORMAL LOW (ref 0.44–1.00)
GFR, Estimated: >60 mL/min (ref >60)
Glucose, Bld: 95 mg/dL (ref 70–99)
Potassium: 3.3 mmol/L — ABNORMAL LOW (ref 3.5–5.1)
Sodium: 129 mmol/L — ABNORMAL LOW (ref 135–145)
Total Bilirubin: 1 mg/dL (ref 0.0–1.2)
Total Protein: 5.7 g/dL — ABNORMAL LOW (ref 6.5–8.1)

## 2023-09-03 LAB — CBC
HCT: 27.2 % — ABNORMAL LOW (ref 36.0–46.0)
Hemoglobin: 8.7 g/dL — ABNORMAL LOW (ref 12.0–15.0)
MCH: 29.8 pg (ref 26.0–34.0)
MCHC: 32 g/dL (ref 30.0–36.0)
MCV: 93.2 fL (ref 80.0–100.0)
Platelets: 105 K/uL — ABNORMAL LOW (ref 150–400)
RBC: 2.92 MIL/uL — ABNORMAL LOW (ref 3.87–5.11)
RDW: 19.4 % — ABNORMAL HIGH (ref 11.5–15.5)
WBC: 4.1 K/uL (ref 4.0–10.5)
nRBC: 0 % (ref 0.0–0.2)

## 2023-09-03 LAB — PROTIME-INR
INR: 1.6 — ABNORMAL HIGH (ref 0.8–1.2)
Prothrombin Time: 19.5 s — ABNORMAL HIGH (ref 11.4–15.2)

## 2023-09-03 LAB — GRAM STAIN

## 2023-09-03 LAB — BODY FLUID CELL COUNT WITH DIFFERENTIAL
Lymphs, Fluid: 63 %
Monocyte-Macrophage-Serous Fluid: 25 % — ABNORMAL LOW (ref 50–90)
Neutrophil Count, Fluid: 12 % (ref 0–25)
Total Nucleated Cell Count, Fluid: 302 uL (ref 0–1000)

## 2023-09-03 LAB — MAGNESIUM: Magnesium: 1.7 mg/dL (ref 1.7–2.4)

## 2023-09-03 MED ORDER — MAGNESIUM SULFATE 4 GM/100ML IV SOLN
4.0000 g | Freq: Once | INTRAVENOUS | Status: AC
  Administered 2023-09-03: 4 g via INTRAVENOUS
  Filled 2023-09-03: qty 100

## 2023-09-03 MED ORDER — MELATONIN 3 MG PO TABS
6.0000 mg | ORAL_TABLET | Freq: Once | ORAL | Status: AC
  Administered 2023-09-03: 6 mg via ORAL
  Filled 2023-09-03: qty 2

## 2023-09-03 MED ORDER — MORPHINE SULFATE (PF) 2 MG/ML IV SOLN
1.0000 mg | INTRAVENOUS | Status: DC | PRN
  Administered 2023-09-04: 1 mg via INTRAVENOUS
  Filled 2023-09-03: qty 1

## 2023-09-03 MED ORDER — POTASSIUM CHLORIDE CRYS ER 20 MEQ PO TBCR
40.0000 meq | EXTENDED_RELEASE_TABLET | ORAL | Status: AC
  Administered 2023-09-03 (×2): 40 meq via ORAL
  Filled 2023-09-03 (×2): qty 2

## 2023-09-03 MED ORDER — ALUM & MAG HYDROXIDE-SIMETH 200-200-20 MG/5ML PO SUSP
30.0000 mL | ORAL | Status: DC | PRN
  Administered 2023-09-03: 30 mL via ORAL
  Filled 2023-09-03: qty 30

## 2023-09-03 NOTE — Procedures (Signed)
 Ultrasound-guided diagnostic and therapeutic paracentesis performed yielding 800 liters of serosanguinous colored fluid.  Fluid was sent to lab for analysis. No immediate complications. EBL is < 1 ml.

## 2023-09-03 NOTE — Evaluation (Signed)
 Occupational Therapy Evaluation Patient Details Name: Joan Wood MRN: [id number] DOB: 05/02/1970 Today's Date: 09/03/2023   History of Present Illness   Joan Wood is a 53 y.o. female with medical history significant of hypertension, GERD, recent exploratory laparotomy with omental patch and liver biopsy for gastric ulcer back on 08/05/2023 and was readmitted on 08/20/23 with abdominal pain and concerns for possible free air.  She presented to the emergency department due to 6 to 7-day onset of upper abdominal pain which was described as sharp stabbing pain rated as 10/10 on pain scale, she also complained of 6-day onset of nonbilious, nonbloody vomiting of about 4 episodes daily and 5-day onset of several episodes of black stools.  She endorsed to drinking about half can of 12 ounce beer yesterday prior to activating EMS.  She denies use of NSAIDs. (per DO)     Clinical Impressions Pt agreeable to OT evaluation. Pt states that her main difficulty at home is transferring to the tub and having the energy to stand and clean herself. Pt educated on a tub bench and hand held shower head use. Handout given on tub bench use. Pt informed how how using this DME/AD could possibly solve some of the problems she has been having caring for herself at home. Today pt was able to ambulate with RW and CGA. B UE generally weak with good A/ROM and ability to reach B LE. Pt left in the chair with call bell within reach. Pt will benefit from continued OT in the hospital and recommended venue below to increase strength, balance, and endurance for safe ADL's.        If plan is discharge home, recommend the following:   A little help with bathing/dressing/bathroom;A little help with walking and/or transfers;Assist for transportation     Functional Status Assessment   Patient has had a recent decline in their functional status and demonstrates the ability to make significant improvements in function in a  reasonable and predictable amount of time.     Equipment Recommendations   Tub/shower bench (Given handout on tub bench and how to use it.)             Precautions/Restrictions   Precautions Precautions: Fall Recall of Precautions/Restrictions: Intact Restrictions Weight Bearing Restrictions Per Provider Order: No (Simultaneous filing. User may not have seen previous data.)     Mobility Bed Mobility               General bed mobility comments: Pt up ambulating with nursing to start the session.    Transfers Overall transfer level: Needs assistance Equipment used: Rolling walker (2 wheels) Transfers: Sit to/from Stand Sit to Stand: Contact guard assist           General transfer comment: Returned to bed without difficulty from ambulation.      Balance Overall balance assessment: Needs assistance Sitting-balance support: No upper extremity supported, Feet supported Sitting balance-Leahy Scale: Good Sitting balance - Comments: Seated EOB   Standing balance support: During functional activity, Bilateral upper extremity supported Standing balance-Leahy Scale: Fair Standing balance comment: with RW                           ADL either performed or assessed with clinical judgement   ADL Overall ADL's : Needs assistance/impaired     Grooming: Contact guard assist;Standing   Upper Body Bathing: Contact guard assist;Minimal assistance;Standing Upper Body Bathing Details (indicate cue type and reason):  Pt states she is having difficult with this at home due to fatigue while standing. Pt states she has trouble washing her hair due to fatigue. Lower Body Bathing: Contact guard assist;Minimal assistance;Sit to/from stand           Toilet Transfer: Contact guard assist;Rolling walker (2 wheels);Ambulation Toilet Transfer Details (indicate cue type and reason): Pt ambulating back to bed from toilet when this therapist entered the  room. Toileting- Clothing Manipulation and Hygiene: Contact guard assist;Supervision/safety;Sitting/lateral lean;Sit to/from stand       Functional mobility during ADLs: Contact guard assist;Rolling walker (2 wheels)       Vision Baseline Vision/History: 1 Wears glasses Ability to See in Adequate Light: 1 Impaired Vision Assessment?: No apparent visual deficits (none observed)     Perception Perception: Not tested       Praxis Praxis: Not tested       Pertinent Vitals/Pain Pain Assessment Pain Assessment: No/denies pain     Extremity/Trunk Assessment Upper Extremity Assessment Upper Extremity Assessment: Generalized weakness   Lower Extremity Assessment Lower Extremity Assessment: Defer to PT evaluation   Cervical / Trunk Assessment Cervical / Trunk Assessment: Normal   Communication Communication Communication: No apparent difficulties   Cognition Arousal: Alert Behavior During Therapy: WFL for tasks assessed/performed Cognition: No apparent impairments                               Following commands: Intact       Cueing  General Comments   Cueing Techniques: Verbal cues;Tactile cues;Visual cues                 Home Living Family/patient expects to be discharged to:: Private residence Living Arrangements: Non-relatives/Friends Available Help at Discharge: Friend(s);Available PRN/intermittently Type of Home: House Home Access: Stairs to enter [employer] of Steps: 5 Entrance Stairs-Rails: Right Home Layout: One level     Bathroom Shower/Tub: Chief Strategy Officer: Standard Bathroom Accessibility: No   Home Equipment: Agricultural consultant (2 wheels);Wheelchair - manual;Crutches;Hand held shower head   Additional Comments: Pt reports she lives with a female roommate who is a Naval architect and gone throughout the week but available on the weekends. (per chart)      Prior Functioning/Environment Prior Level of  Function : Independent/Modified Independent;History of Falls (last six months)       Physical Assist : ADLs (physical)   ADLs (physical): IADLs Mobility Comments: Pt reports she is mostly a household ambulator and uses furniture and walls for stability. Reports use of w/c some days when in more pain while doing iADLs (laundry). Reports little ambulation outside of home recently ADLs Comments: Pt reports she is having trouble getting in and out of the tub at home. pt reports she fatigues trying to stand while bathing and is limited for cleaning herself. Pt able to compelte other ADL's.    OT Problem List: Decreased strength;Decreased activity tolerance   OT Treatment/Interventions: Self-care/ADL training;Therapeutic activities;Balance training;DME and/or AE instruction;Patient/family education      OT Goals(Current goals can be found in the care plan section)   Acute Rehab OT Goals Patient Stated Goal: return home OT Goal Formulation: With patient Time For Goal Achievement: 09/17/23 Potential to Achieve Goals: Good   OT Frequency:  Min 1X/week  End of Session Equipment Utilized During Treatment: Rolling walker (2 wheels)  Activity Tolerance: Patient tolerated treatment well Patient left: in bed;with call bell/phone within reach  OT Visit Diagnosis: Unsteadiness on feet (R26.81);Muscle weakness (generalized) (M62.81)                Time: 8987-8976 OT Time Calculation (min): 11 min Charges:  OT General Charges $OT Visit: 1 Visit OT Evaluation $OT Eval Low Complexity: 1 Low  Latrell Potempa OT, MOT  Jayson Person 09/03/2023, 11:20 AM

## 2023-09-03 NOTE — Progress Notes (Signed)
 Gastroenterology Progress Note   Primary Gastroenterologist:  Dr. Cindie  Patient ID: Joan Wood; [id number]; Jan 14, 1970    Subjective   1 loose stool yesterday, none today. Nausea improving. Pain feels under control. Feels like abdomen is bloated. No mental status changes or confusion. Difficulty sleeping through the night. States she drank just 1/2 beer prior to this admission.    Objective   Vital signs in last 24 hours Temp:  [98.1 F (36.7 C)-98.7 F (37.1 C)] 98.1 F (36.7 C) (08/25 0542) Pulse Rate:  [95-99] 95 (08/25 0542) Resp:  [16-19] 19 (08/25 0542) BP: (99-105)/(63-69) 99/67 (08/25 0542) SpO2:  [95 %-99 %] 97 % (08/25 0542) Last BM Date : 09/01/23  Physical Exam General:   Alert and oriented, pleasant. Chronically ill-appearing Head:  Normocephalic and atraumatic. Eyes:  No icterus, sclera clear. Conjuctiva pink.  Abdomen:  Bowel sounds present, midline laparotomy incision C/D/I, distended, TTP RLQ extending up to RUQ and across upper abdomen Msk:  Symmetrical without gross deformities. Normal posture. Extremities:  Without  edema. Neurologic:  Alert and  oriented x4  Intake/Output from previous day: 08/24 0701 - 08/25 0700 In: 720 [P.O.:720] Out: -  Intake/Output this shift: No intake/output data recorded.  Lab Results  Recent Labs    09/01/23 1543 09/02/23 0448 09/03/23 0528  WBC 5.9 4.2 4.1  HGB 9.7* 8.8* 8.7*  HCT 29.6* 26.6* 27.2*  PLT 125* 121* 105*   BMET Recent Labs    09/01/23 1543 09/02/23 0448 09/03/23 0528  NA 128* 129* 129*  K 4.8 3.8 3.3*  CL 95* 100 100  CO2 23 24 25   GLUCOSE 77 89 95  BUN <5* <5* <5*  CREATININE 0.42* 0.47 0.43*  CALCIUM  7.7* 7.6* 7.7*   LFT Recent Labs    09/01/23 0600 09/02/23 0448 09/03/23 0528  PROT 6.0* 5.7* 5.7*  ALBUMIN 2.1* 1.9* 1.8*  AST 43* 41 49*  ALT 18 16 17   ALKPHOS 63 56 60  BILITOT 1.4* 1.1 1.0   Lab Results  Component Value Date   INR 1.6 (H) 09/03/2023   INR 1.3 (H)  08/21/2023   INR 1.3 (H) 08/20/2023      Studies/Results CT ABDOMEN PELVIS W CONTRAST Result Date: 08/31/2023 CLINICAL DATA:  Acute abdominal pain, history of prior gastric ulcer perforation EXAM: CT ABDOMEN AND PELVIS WITH CONTRAST TECHNIQUE: Multidetector CT imaging of the abdomen and pelvis was performed using the standard protocol following bolus administration of intravenous contrast. RADIATION DOSE REDUCTION: This exam was performed according to the departmental dose-optimization program which includes automated exposure control, adjustment of the mA and/or kV according to patient size and/or use of iterative reconstruction technique. CONTRAST:  OMNIPAQUE  IOHEXOL  300 MG/ML  SOLN COMPARISON:  08/19/2023 FINDINGS: Lower chest: Lung bases are free of acute infiltrate or sizable effusion. Hepatobiliary: Liver is diffusely decreased in attenuation. Gallbladder is partially distended. Perihepatic fluid is noted stable from the prior exam. Pancreas: Pancreas is well visualized and within normal limits. Spleen: Normal in size without focal abnormality. Minimal perisplenic fluid is seen. Adrenals/Urinary Tract: Adrenal glands are within normal limits. Kidneys demonstrate normal enhancement bilaterally. No renal calculi or obstructive changes are seen. The bladder is well distended. Stomach/Bowel: No obstructive or inflammatory changes of the colon are seen. The appendix is not well visualized. Small bowel is within normal limits. There remains a large air-filled defect of the gastric wall consistent with the known previous perforated ulcer with subsequent repair. This is stable from the prior  exam. The previously seen free air in the left upper quadrant is no longer identified. The previously seen surgical drain has been removed in the interval. Vascular/Lymphatic: Aortic atherosclerosis. No enlarged abdominal or pelvic lymph nodes. Reproductive: Uterus and bilateral adnexa are unremarkable. Other: No  abdominal wall hernia or abnormality. No abdominopelvic ascites. Musculoskeletal: No acute or significant osseous findings. IMPRESSION: Minimal ascites similar to that seen on the prior exam. Resolution of previously seen free air. No findings to suggest perforation are noted at this time. Persistent ulceration within the gastric wall related to prior perforated ulcer with repair. No evidence of perforation is seen at this time. Electronically Signed   By: Oneil Devonshire M.D.   On: 08/31/2023 23:16   US  ASCITES (ABDOMEN LIMITED) Result Date: 08/27/2023 INDICATION: see if she has sufficient ascites for paracentesis 53 year old female. History of perforation of the anterior gastric wall secondary to ulcers. Found to have ascites. Request is therapeutic paracentesis. EXAM: ULTRASOUND ABDOMEN LIMITED FINDINGS: Imaging of all 4 quadrants of the abdomen reveal small amount of ascites. IMPRESSION: Small volume of ascites. Given the given the limited volume, the patient declined the procedure at this time. Performed by: Delon Beagle, NP Electronically Signed   By: Thom Hall M.D.   On: 08/27/2023 09:01   DG UGI W SINGLE CM (SOL OR THIN BA) Result Date: 08/20/2023 CLINICAL DATA:  Patient with history of perforation of the anterior gastric wall secondary to ulcer status post repair 08/05/2023. Upper GI series 08/10/2023 negative for contrast extravasation and patient was discharged shortly after. She return to the ED yesterday with complaints of abdominal pain in CT abdomen pelvis showed concern for small amount of free air in the left upper quadrant worrisome for perforation. Request for limited upper GI to rule out postoperative perforation. EXAM: DG UGI W SINGLE CM TECHNIQUE: Scout radiograph was obtained. Single contrast examination was performed using thin liquid barium. This exam was performed by Clotilda Hesselbach, PA-C, and was supervised and interpreted by Ester Sides, MD. FLUOROSCOPY: Radiation Exposure  Index (as provided by the fluoroscopic device): 33.10 mGy Kerma COMPARISON:  Upper GI series 08/10/2023, CT abdomen pelvis with contrast 08/19/2023 FINDINGS: Scout Radiograph: Nonspecific bowel gas pattern. Multiple leads overlying the abdomen. Midline surgical staples in place. Surgical drain exiting left upper quadrant with distal tip in the right upper quadrant. Contrast within the urinary bladder. Stomach: Normal appearance. Contrast fills the stomach without difficulty. No evidence of contrast extravasation in the supine AP, right oblique, left oblique, right lateral, left lateral or prone positions. Gastric emptying: Normal. Duodenum:  Normal appearance on limited exam. IMPRESSION: Single-contrast upper GI without evidence of contrast extravasation. Electronically Signed   By: Ester Sides M.D.   On: 08/20/2023 16:07   CT ABDOMEN PELVIS W CONTRAST Result Date: 08/19/2023 CLINICAL DATA:  Acute abdominal pain EXAM: CT ABDOMEN AND PELVIS WITH CONTRAST TECHNIQUE: Multidetector CT imaging of the abdomen and pelvis was performed using the standard protocol following bolus administration of intravenous contrast. RADIATION DOSE REDUCTION: This exam was performed according to the departmental dose-optimization program which includes automated exposure control, adjustment of the mA and/or kV according to patient size and/or use of iterative reconstruction technique. CONTRAST:  OMNIPAQUE  IOHEXOL  300 MG/ML  SOLN COMPARISON:  CT abdomen and pelvis 08/13/2023 FINDINGS: Lower chest: No acute abnormality. Hepatobiliary: The liver is mildly enlarged. There is a stable 8 mm rounded hypodense area in the inferior right lobe of the liver, unchanged. Slightly nodular liver contour present. There is  diffuse gallbladder wall edema similar to the prior study. No radiopaque calculi are identified. No biliary ductal dilatation. Pancreas: Unremarkable. No pancreatic ductal dilatation or surrounding inflammatory changes.  Spleen: Normal in size without focal abnormality. Adrenals/Urinary Tract: Adrenal glands are unremarkable. Kidneys are normal, without renal calculi, focal lesion, or hydronephrosis. Bladder is unremarkable. Stomach/Bowel: There is wall thickening and edema of the ascending colon and proximal transverse colon. The appendix is unremarkable. No dilated bowel loops are present. There is also diffuse wall thickening and edema of the gastric antrum with findings worrisome for gastric ulceration along the anterior antrum. Vascular/Lymphatic: Aorta and IVC are normal in size. There are atherosclerotic calcifications of the aorta. No enlarged lymph nodes are seen. Reproductive: Uterus and bilateral adnexa are unremarkable. Other: There is a small amount of free fluid throughout the abdomen and pelvis which has minimally increased. There is a small amount of free air in the left upper quadrant which is new from prior. Percutaneous drainage catheter is again seen with distal tip adjacent to the gallbladder, unchanged in position. No focal abscess identified. No significant abdominal wall hernia. Musculoskeletal: Mild chronic compression deformity T12 is unchanged. IMPRESSION: 1. New small amount of free air in the left upper quadrant worrisome for bowel perforation. 2. Diffuse wall thickening and edema of the gastric antrum with findings worrisome for gastric ulceration along the anterior antrum. This may be source or free air. 3. Wall thickening and edema of the ascending colon and proximal transverse colon worrisome for colitis. 4. Increasing small amount of free fluid throughout the abdomen and pelvis. 5. Unchanged percutaneous drainage catheter with distal tip adjacent to the gallbladder. No focal abscess identified. 6. Unchanged diffuse gallbladder wall edema. 7. Stable mild hepatomegaly with slightly nodular liver contour. Correlate clinically for cirrhosis. 8. Aortic atherosclerosis. Aortic Atherosclerosis  (ICD10-I70.0). Electronically Signed   By: Greig Pique M.D.   On: 08/19/2023 23:53   DG Chest Port 1 View Result Date: 08/19/2023 CLINICAL DATA:  upper abd pain EXAM: PORTABLE CHEST 1 VIEW COMPARISON:  Chest x-ray 08/04/2023 FINDINGS: The heart and mediastinal contours are unchanged. Atherosclerotic plaque. No focal consolidation. No pulmonary edema. No pleural effusion. No pneumothorax. No acute osseous abnormality. IMPRESSION: 1. No active disease. 2.  Aortic Atherosclerosis (ICD10-I70.0). Electronically Signed   By: Morgane  Naveau M.D.   On: 08/19/2023 22:50   CT ABDOMEN PELVIS W CONTRAST Result Date: 08/13/2023 CLINICAL DATA:  Abdominal pain, post-op. EXAM: CT ABDOMEN AND PELVIS WITH CONTRAST TECHNIQUE: Multidetector CT imaging of the abdomen and pelvis was performed using the standard protocol following bolus administration of intravenous contrast. RADIATION DOSE REDUCTION: This exam was performed according to the departmental dose-optimization program which includes automated exposure control, adjustment of the mA and/or kV according to patient size and/or use of iterative reconstruction technique. CONTRAST:  OMNIPAQUE  IOHEXOL  300 MG/ML  SOLN COMPARISON:  CT scan abdomen and pelvis from 08/05/2023. FINDINGS: Lower chest: There are patchy atelectatic changes in the visualized lung bases. No overt consolidation. No pleural effusion. The heart is normal in size. No pericardial effusion. Hepatobiliary: The liver is normal in size. Non-cirrhotic configuration. There is subtle liver surface irregularity/nodularity. Redemonstration of diffuse heterogeneous hypoattenuating liver attenuation, similar to the prior study. The portal vein and its major tributaries are patent. There is a stable approximately 9 x 11 mm hypoattenuating lesion in the right hepatic lobe, which is too small to adequately characterize. No intrahepatic or extrahepatic bile duct dilation. No calcified gallstones. Normal gallbladder  wall thickness.  No pericholecystic inflammatory changes. Pancreas: Unremarkable. No pancreatic ductal dilatation or surrounding inflammatory changes. Spleen: Within normal limits. No focal lesion. Adrenals/Urinary Tract: Adrenal glands are unremarkable. No suspicious renal mass. No hydronephrosis. No renal or ureteric calculi. There is mild irregular thickening of the anterior wall of urinary bladder. However, no perivesical fat stranding. Findings may represent chronic versus acute cystitis. Correlate clinically and with urinalysis. No focal bladder mass or bladder calculi. Stomach/Bowel: No disproportionate dilation of the small or large bowel loops. No evidence of abnormal bowel wall thickening or inflammatory changes. The appendix is unremarkable. Vascular/Lymphatic: Trace amount of ascites between the gallbladder and right hepatic lobe. No pneumoperitoneum. No abdominal or pelvic lymphadenopathy, by size criteria. No aneurysmal dilation of the major abdominal arteries. There are mild peripheral atherosclerotic vascular calcifications of the aorta and its major branches. Reproductive: The uterus is unremarkable. No large adnexal mass. Other: Midline epigastric surgical scar and skin staples noted. There is a JP drain inserted from left paramedian epigastric approach with its tip in the right upper quadrant just inferior to the gallbladder. There is trace amount of fluid between the gallbladder and right hepatic lobe. No walled-off abscess. There is a tiny fat containing right inguinal hernia the soft tissues and abdominal wall are otherwise unremarkable. Musculoskeletal: No suspicious osseous lesions. There are mild multilevel degenerative changes in the visualized spine. IMPRESSION: 1. There is a JP-drain inserted from left paramedian epigastric approach with its tip in the right upper quadrant just inferior to the gallbladder. There is trace amount of fluid between the gallbladder and right hepatic lobe. No  walled-off abscess. 2. There is mild irregular thickening of the anterior wall of urinary bladder. However, no perivesical fat stranding. Findings may represent chronic versus acute cystitis. Correlate clinically and with urinalysis. 3. Multiple other nonacute observations, as described above. Aortic Atherosclerosis (ICD10-I70.0). Electronically Signed   By: Ree Molt M.D.   On: 08/13/2023 13:35   DG UGI W SINGLE CM (SOL OR THIN BA) Result Date: 08/10/2023 CLINICAL DATA:  Status post surgical repair of perforated gastric ulcer. Request for limited upper GI to evaluate for leak. EXAM: DG UGI W SINGLE CM TECHNIQUE: Single contrast examination was then performed using water-soluble contrast. Contrast injected down patient's existing nasogastric tube into the stomach. This exam was performed by Franky Rusk PA-C , and was supervised and interpreted by Dr. Ester Sides. FLUOROSCOPY: Radiation Exposure Index (as provided by the fluoroscopic device): 44.50 mGy Kerma COMPARISON:  None Available. FINDINGS: Midline surgical staples consistent with open abdominal surgery. Nasogastric tube with tip terminating in the body of the stomach. Surgical drain noted overlying the expected surgical site. Contrast fills the stomach without difficulty. Images taken in AP, various obliquities, and right lateral decubitus positions. No definitive evidence of contrast extravasation from the expected surgical site. Normal filling of the pylorus with prompt gastric emptying. Normal caliber duodenum with active peristalsis. IMPRESSION: No definitive contrast extravasation or leak from expected gastric perforation surgical repair site. Electronically Signed   By: Ester Sides M.D.   On: 08/10/2023 11:37   DG Abd 1 View Result Date: 08/07/2023 CLINICAL DATA:  Nasogastric tube EXAM: ABDOMEN - 1 VIEW COMPARISON:  Abdominal x-ray 08/05/2023 FINDINGS: Nasogastric tube tip at the level of the gastric antrum. Surgical drain and skin staples  are again seen in the mid abdomen. Right upper extremity PICC terminates over the distal SVC. The lungs appear clear. There is no pneumothorax. The cardiomediastinal silhouette is within normal limits. IMPRESSION: Nasogastric tube  tip at the level of the gastric antrum. Electronically Signed   By: Greig Pique M.D.   On: 08/07/2023 22:29   US  Abdomen Limited Result Date: 08/06/2023 CLINICAL DATA:  Evaluate for ascites. EXAM: LIMITED ABDOMEN ULTRASOUND FOR ASCITES TECHNIQUE: Limited ultrasound survey for ascites was performed in all four abdominal quadrants. COMPARISON:  CT abdomen August 05, 2023 FINDINGS: No free fluid within the limited images provided in 4 quadrants. IMPRESSION: No ascites identified on current exam and may be obscured by bowel gas (on recent CT from 08/05/2023 there is ascites in perihepatic, perisplenic and pelvic cavity). Electronically Signed   By: Megan  Zare M.D.   On: 08/06/2023 16:53   US  EKG SITE RITE Result Date: 08/06/2023 If Site Rite image not attached, placement could not be confirmed due to current cardiac rhythm.  DG Abd 1 View Result Date: 08/05/2023 CLINICAL DATA:  NG tube placement. EXAM: ABDOMEN - 1 VIEW COMPARISON:  None Available. FINDINGS: NG tube tip is in the region of the gastric fundus. Side port of the NG tube is well below the GE junction. Mild gaseous distention of the stomach evident. Surgical drain overlies the midline upper abdomen. IMPRESSION: NG tube tip is in the region of the gastric fundus. Electronically Signed   By: Camellia Candle M.D.   On: 08/05/2023 06:43   CT ABDOMEN PELVIS W CONTRAST Result Date: 08/05/2023 CLINICAL DATA:  Acute abdominal pain for 2 days, initial encounter EXAM: CT ABDOMEN AND PELVIS WITH CONTRAST TECHNIQUE: Multidetector CT imaging of the abdomen and pelvis was performed using the standard protocol following bolus administration of intravenous contrast. RADIATION DOSE REDUCTION: This exam was performed according to the  departmental dose-optimization program which includes automated exposure control, adjustment of the mA and/or kV according to patient size and/or use of iterative reconstruction technique. CONTRAST:  OMNIPAQUE  IOHEXOL  300 MG/ML  SOLN COMPARISON:  05/27/2023 FINDINGS: Lower chest: Lung bases are free of acute infiltrate or sizable effusion. Hepatobiliary: Diffuse decreased attenuation is noted throughout the liver likely related to underlying cirrhotic change. Mild nodularity is seen. Recanalization of the umbilical vein is noted which subsequently passes through a umbilical hernia and than decompresses via collaterals into the left external iliac vein. Gallbladder is well distended without cholelithiasis. Some mild pericholecystic fluid is noted related to the known perforation. Pancreas: Unremarkable. No pancreatic ductal dilatation or surrounding inflammatory changes. Spleen: Normal in size without focal abnormality. Adrenals/Urinary Tract: Adrenal glands are within normal limits. Kidneys show no renal calculi or obstructive changes. The bladder is well distended. Stomach/Bowel: No obstructive or inflammatory changes of the colon are seen. The appendix again demonstrates appendicoliths distally although no significant changes to suggest acute appendicitis are noted. Small bowel is within normal limits. There remains thickening of the gastric wall similar to that seen on the prior exam. The outpouching of air suspicious for ulceration on the prior exam now shows free communication through the anterior gastric wall along the posterior aspect of the left lobe of the liver consistent with perforated ulcer. Considerable free air is noted. Free fluid is noted as well related to these changes. Small hiatal hernia is noted. Vascular/Lymphatic: Aortic atherosclerosis. No enlarged abdominal or pelvic lymph nodes. Reproductive: Uterus and bilateral adnexa are unremarkable. Other: Free fluid and free air is noted  related to the gastric ulcer perforation. Fat containing umbilical hernia is noted. 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 These changes are stable from the prior exam. Musculoskeletal:  Old left rib fractures are noted posteriorly. No acute bony abnormality is noted. Anterolisthesis of L4 on L5 is again noted. IMPRESSION: Changes consistent with perforated anterior gastric ulcer progressed in the interval from the prior exam. Considerable free air and free fluid is noted related to the perforation. 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 Prominent appendix with single appendicolith within. This is stable in appearance from the prior exam without inflammatory change. Critical Value/emergent results were called by telephone at the time of interpretation on 08/05/2023 at 1:06 am to Dr. DONALD WICKLINE , who verbally acknowledged these results. Electronically Signed   By: Oneil Devonshire M.D.   On: 08/05/2023 01:12   DG Chest Portable 1 View Result Date: 08/04/2023 CLINICAL DATA:  Dyspnea EXAM: PORTABLE CHEST 1 VIEW COMPARISON:  CT 05/12/2023 FINDINGS: The heart size and mediastinal contours are within normal limits. Both lungs are clear. The visualized skeletal structures are unremarkable. IMPRESSION: No active disease. Electronically Signed   By: Dorethia Molt M.D.   On: 08/04/2023 23:50   Lab Results  Component Value Date   IRON 8 (L) 08/06/2023   TIBC 310 08/06/2023   FERRITIN 266 05/12/2023     Assessment  53 y.o. female with a history of ETOH abuse, cirrhosis felt likely due to ETOH, GERD, HTN, SVT, periumbilical hernia, chronic diarrhea, liver lesion in past with normal AFP, GI bleed in May 2025 with findings of PUD on EGD, gastric perforation July 2025 and underwent exploratory laparotomy with gastrorrhaphy, omental patch, wedge liver biopsy, primary umbilical hernia repair, frequent admissions, last seen inpatient due to  concerns for chronic diarrhea and rectal bleeding, now admitted again with recurrent diarrhea, nausea, and vomiting. In the ED, alcohol level was 139, and she had significant metabolic derangements.   Acute on chronic abdominal pain and vomiting: persistent gastric wall ulceration on CT, minimal ascites, no perforation,  no evidence for pancreatitis. This has improved with supportive measures. Would benefit from outpatient EGD but no sooner than 2 months out in light of recent surgery. Continues to drink alcohol when discharged; discussed with patient the increased morbidity and mortality with this.   Acute on chronic diarrhea with rectal bleeding: no stool studies this admission. Improved. Notably, baseline is chronic diarrhea felt multifactorial in setting of ETOH, possible EPI, IBS, with plans for outpatient ileocolonoscopy. Positive Cdiff antigen 8/11 with negative PCR, treated empirically with vancomycin  in light of abx exposure and CT findings of colitis. Does not appear to have infectious etiology.   Anemia: multiple etiologies including chronic disease, ETOH use, IDA. low-volume hematochezia in the past likely due to benign anorectal source. Hgb stable at 8.7 past 24-48 hours. No overt GI bleeding  Cirrhosis: MELD 3.0 is 21. Possible tense ascites on exam vs her baseline. US  para ordered in case of notable fluid.    Plan / Recommendations  PPI BID, change to oral when tolerating diet Can continue carafate  Absolute ETOH cessation US  para if sufficient ascites Outpatient ileocolonoscopy and EGD no sooner than 2 months from now if possible due to recent surgical intervention Outpatient cirrhosis care    LOS: 2 days    09/03/2023, 8:42 AM  Therisa MICAEL Stager, PhD, ANP-BC [facility]

## 2023-09-03 NOTE — Progress Notes (Signed)
 PROGRESS NOTE   Joan Wood  FMW:[id number]    DOB: April 09, 1970    DOA: 08/31/2023  PCP: Zarwolo, Gloria, FNP   I have briefly reviewed patients previous medical records in [facility].   Brief Hospital Course:  53 year old female, lives with her roommate, ambulates with the help of a cane, medical history significant for s/p exploratory laparotomy for perforated gastric ulcer repair with omental patch on 7/27, ongoing alcohol use disorder, tobacco abuse, cirrhosis, HTN, GERD, anxiety/depression, multiple hospitalizations (since her sixth hospital admission this year), presented to the ED with complaints of couple days of pain across upper abdomen, severe, associated with multiple episodes of nonbloody emesis and few episodes of black tarry stools in the context of ongoing alcohol use, drank about half a can of a 12 ounce beer on the morning of admission.  Denies NSAID use.  Admitted for nausea, vomiting, abdominal pain, possible upper GI bleed, multiple electrolyte abnormalities.  Clinically improving.  Possible DC home 8/26.   Assessment & Plan:   Nausea, vomiting, abdominal pain, possible acute upper GI bleed Hemodynamically stable. FOBT positive. CT A/P with contrast 8/22: Resolution of previously seen free air.  No findings to suggest perforation at this time.  Persistent ulceration within the gastric wall related to prior perforated ulcer with repair. GI consulted.  Discussed with Dr. Eartha 8/23 who recommends no endoscopy at this time, advance to soft diet, twice daily IV PPI, sucralfate . Lipase minimally elevated at 82, unclear significance but would not change supportive care management. No diarrhea.  Discontinued contact isolation and stool testing.  Tolerating some diet, up to 30% documented last evening.  Advised patient to minimize use of IV pain meds and antiemetics and try out p.o. meds.  Also advised her that she probably is going to have ongoing abdominal  pain/discomfort, nausea for several days even a couple of weeks until adequate healing of her GI tract from recent surgery, alcohol use disorder etc.  She verbalized understanding.  Chronic GI blood loss anemia Hemoglobin on admission 10.1. Hemoglobin fluctuating, overall appears stable in the 8 g range, close to her recent baseline on 8/13.  Follow CBC in AM.  Severe hypokalemia Continue to replace and follow.  Hypomagnesemia Low normal.  Continue to replace and follow.  Elevated lactate Secondary to cirrhosis and hypoperfusion from GI losses IV fluids and resolved.  Hyponatremia, chronic Secondary to alcohol/beer use, cirrhosis, intravascular volume depletion from GI losses and poor oral intake Presented with serum sodium of 125. Improved and stable with serum sodiums fluctuating in the high 120s-130.  Alcohol use disorder Blood alcohol level 139 on admission. Absolute abstinence counseled CIWA protocol, scores 0.  No overt withdrawal.  Tobacco use disorder Absolute cessation counseled Patient declines nicotine  patch  Thrombocytopenia Chronic and intermittent secondary to cirrhosis and alcohol use.  Stable.  Cirrhosis No clinical ascites.  INR 1.3. Liver biopsy from 08/05/2023: Pathology shows mild to moderately active steatohepatitis with cirrhosis.  S/p exploratory laparotomy 7/27 Still had staples in place which were removed on 8/23.  Outpatient follow-up with general surgery.  Body mass index is 23.46 kg/m.   DVT prophylaxis: SCDs Start: 09/01/23 0114     Code Status: Full Code:  Family Communication: None at bedside Disposition:  Continues to gradually improve.  As per discussion with nursing, it appears that patient history does not line up.  Although patient reported diarrhea, no diarrhea witnessed.  Also no black stools that was reported by patient.  Patient continues to decline  SNF.  Per discussion with TOC, none of the home health agencies accept her  insurance.  Next option would be outpatient PT and OT.  Possible DC home 8/26.     Consultants:   GI  Procedures:     Subjective:  Ongoing nausea but no vomiting since hospital admission.  Although patient reports diarrhea, it does not appear that she is having diarrhea.  Per nursing tech and patient's RN, she had a smear of dark brown stools and not black stools this morning.  Tolerating some diet.  Ongoing abdominal pain.  Objective:   Vitals:   09/02/23 0620 09/02/23 1308 09/02/23 1948 09/03/23 0542  BP: 111/82 100/63 105/69 99/67  Pulse: 92 98 99 95  Resp: 18 16 18 19   Temp: 98.3 F (36.8 C) 98.7 F (37.1 C) 98.2 F (36.8 C) 98.1 F (36.7 C)  TempSrc: Oral Oral Oral Oral  SpO2: 95% 95% 99% 97%  Weight:      Height:        General exam: Young female, moderately built, frail, chronically ill looking sitting up at edge of bed working with OT during my visit.  Did not appear in any painful or respiratory distress. Respiratory system: Clear to auscultation.  No increased work of breathing. Cardiovascular system: S1 & S2 heard, RRR. No JVD, murmurs, rubs, gallops or clicks. No pedal edema.  Off telemetry. Gastrointestinal system: Abdomen is mildly distended, soft, voluntary guarding with mild tenderness without rigidity or rebound.  No organomegaly or masses appreciated.  No significant clinical ascites.  Normal bowel sounds heard.  Stable and unchanged.  Laparotomy staples were removed on 8/23. Central nervous system: Alert and oriented. No focal neurological deficits. Extremities: Symmetric 5 x 5 power. Skin: No rashes, lesions or ulcers Psychiatry: Judgement and insight appear normal. Mood & affect appropriate.     Data Reviewed:   I have personally reviewed following labs and imaging studies   CBC: Recent Labs  Lab 08/31/23 2152 08/31/23 2240 09/01/23 1543 09/02/23 0448 09/03/23 0528  WBC 5.9   < > 5.9 4.2 4.1  NEUTROABS 3.8  --   --   --   --   HGB 10.1*    < > 9.7* 8.8* 8.7*  HCT 30.3*   < > 29.6* 26.6* 27.2*  MCV 88.3   < > 91.4 91.4 93.2  PLT 148*   < > 125* 121* 105*   < > = values in this interval not displayed.    Basic Metabolic Panel: Recent Labs  Lab 08/31/23 2152 08/31/23 2240 09/01/23 0047 09/01/23 0600 09/01/23 1543 09/02/23 0448 09/03/23 0528  NA 125* 128*  --  130* 128* 129* 129*  K 2.6* 2.5* 2.9* 2.9* 4.8 3.8 3.3*  CL 86* 87*  --  93* 95* 100 100  CO2 24  --   --  26 23 24 25   GLUCOSE 86 81  --  74 77 89 95  BUN <5* <3*  --  <5* <5* <5* <5*  CREATININE 0.39* 0.60  --  0.36* 0.42* 0.47 0.43*  CALCIUM  8.0*  --   --  7.4* 7.7* 7.6* 7.7*  MG 1.6*  --   --  1.8  --  1.6* 1.7    Liver Function Tests: Recent Labs  Lab 08/31/23 2152 09/01/23 0600 09/02/23 0448 09/03/23 0528  AST 56* 43* 41 49*  ALT 22 18 16 17   ALKPHOS 76 63 56 60  BILITOT 1.5* 1.4* 1.1 1.0  PROT 7.2 6.0* 5.7*  5.7*  ALBUMIN 2.5* 2.1* 1.9* 1.8*    CBG: No results for input(s): GLUCAP in the last 168 hours.  Microbiology Studies:  No results found for this or any previous visit (from the past 240 hours).  Radiology Studies:  No results found.   Scheduled Meds:    pantoprazole  (PROTONIX ) IV  40 mg Intravenous Q12H   potassium chloride   40 mEq Oral Q4H   sucralfate   1 g Oral TID WC & HS    Continuous Infusions:    magnesium  sulfate bolus IVPB 4 g (09/03/23 0956)      LOS: 2 days     Trenda Mar, MD,  FACP, [facility], [facility], [facility]   [facility] Advisor [place]      To contact the attending provider between 7A-7P or the covering provider during after hours 7P-7A, please log into the web site [email or web address] and access using universal  password for that web site. If you do not have the password, please call the hospital operator.  09/03/2023, 11:53 AM

## 2023-09-03 NOTE — Progress Notes (Signed)
 Mobility Specialist Progress Note:    09/03/23 1335  Mobility  Activity Ambulated with assistance  Level of Assistance Standby assist, set-up cues, supervision of patient - no hands on  Assistive Device Front wheel walker  Distance Ambulated (ft) 25 ft  Range of Motion/Exercises Active;All extremities  Activity Response Tolerated well  Mobility Referral Yes  Mobility visit 1 Mobility  Mobility Specialist Start Time (ACUTE ONLY) 1335  Mobility Specialist Stop Time (ACUTE ONLY) 1350  Mobility Specialist Time Calculation (min) (ACUTE ONLY) 15 min   Pt received in bed, requesting assistance to bathroom. Required SBA to stand ambulate with RW. Returned supine, all needs met.  Taygen Acklin Mobility Specialist Please contact via Special educational needs teacher or  Rehab office at [phone]

## 2023-09-03 NOTE — TOC Progression Note (Signed)
 Transition of Care [facility]) - Progression Note    Patient Details  Name: Joan Wood MRN: [id number] Date of Birth: March 15, 1970  [facility]) CM/SW Contact  Noreen KATHEE [place], [place] Phone Number: 09/03/2023, 12:31 PM  Clinical Narrative:     Patient refused SNF. CSW attempted to discuss/ask patient if it was fine to make referral to OPPT and OPOT and patient stated to check back with her tomorrow since she will DC tomorrow and haven't had any sleep. TOC following   Expected Discharge Plan: Home/Self Care Barriers to Discharge: Continued Medical Work up    Expected Discharge Plan and Services In-house Referral: Clinical Social Work Discharge Planning Services: CM Consult   Living arrangements for the past 2 months: Single Family Home                    Social Drivers of Health (SDOH) Interventions SDOH Screenings   Food Insecurity: No Food Insecurity (09/01/2023)  Housing: Low Risk  (09/01/2023)  Transportation Needs: No Transportation Needs (09/01/2023)  Recent Concern: Transportation Needs - Unmet Transportation Needs (08/20/2023)  Utilities: Not At Risk (09/01/2023)  Depression (PHQ2-9): High Risk (07/30/2023)  Social Connections: Moderately Isolated (09/01/2023)  Tobacco Use: High Risk (09/01/2023)    Readmission Risk Interventions    09/03/2023   12:31 PM 09/02/2023    9:01 AM 08/20/2023    8:21 AM  Readmission Risk Prevention Plan  Transportation Screening Complete Complete Complete  PCP or Specialist Appt within 3-5 Days  Complete   HRI or Home Care Consult  Complete Complete  Social Work Consult for Recovery Care Planning/Counseling  Complete Complete  Palliative Care Screening  Not Applicable Not Applicable  Medication Review Oceanographer) Complete Complete Complete  HRI or Home Care Consult Complete    SW Recovery Care/Counseling Consult Complete    Palliative Care Screening Not Applicable    Skilled Nursing Facility Patient Refused

## 2023-09-03 NOTE — Evaluation (Signed)
 Physical Therapy Evaluation Patient Details Name: Joan Wood MRN: [id number] DOB: [birth year] Today's Date: 09/03/2023  History of Present Illness  Joan Wood is a 53 y.o. female with medical history significant of hypertension, GERD, recent exploratory laparotomy with omental patch and liver biopsy for gastric ulcer back on 08/05/2023 and was readmitted on 08/20/23 with abdominal pain and concerns for possible free air.  She presented to the emergency department due to 6 to 7-day onset of upper abdominal pain which was described as sharp stabbing pain rated as 10/10 on pain scale, she also complained of 6-day onset of nonbilious, nonbloody vomiting of about 4 episodes daily and 5-day onset of several episodes of black stools.  She endorsed to drinking about half can of 12 ounce beer yesterday prior to activating EMS.  She denies use of NSAIDs.   Clinical Impression  Patient agreeable to PT session today. Patient demonstrates improved ability to complete functional tasks though still requires minimal assistance for safety, specifically with dynamic standing tasks. Patient tolerance much improved for ambulation with RW, though some c/o abdominal discomfort with exertion. Patient left in bed with call bell in reach. Patient will benefit from continued skilled physical therapy in hospital and recommended venue below to increase strength, balance, endurance for safe ADLs and gait.        If plan is discharge home, recommend the following: Help with stairs or ramp for entrance;Assist for transportation;A little help with walking and/or transfers   Can travel by private vehicle        Equipment Recommendations None recommended by PT  Recommendations for Other Services       Functional Status Assessment Patient has had a recent decline in their functional status and demonstrates the ability to make significant improvements in function in a reasonable and predictable amount of time.      Precautions / Restrictions Precautions Precautions: Fall Recall of Precautions/Restrictions: Intact Restrictions Weight Bearing Restrictions Per Provider Order: No      Mobility  Bed Mobility Overal bed mobility: Modified Independent Bed Mobility: Supine to Sit     Supine to sit: Modified independent (Device/Increase time), HOB elevated          Transfers Overall transfer level: Modified independent Equipment used: None Transfers: Sit to/from Stand, Bed to chair/wheelchair/BSC Sit to Stand: Modified independent (Device/Increase time)   Step pivot transfers: Modified independent (Device/Increase time)       General transfer comment: Pt able to transfer to recliner without AD    Ambulation/Gait Ambulation/Gait assistance: Supervision, Contact guard assist Gait Distance (Feet): 100 Feet Assistive device: Rolling walker (2 wheels) Gait Pattern/deviations: Step-through pattern, Decreased step length - right, Decreased step length - left       General Gait Details: Pt tolerance much improved for ambulation with RWm, some c/o of abdominal discomfort  Stairs            Wheelchair Mobility     Tilt Bed    Modified Rankin (Stroke Patients Only)       Balance Overall balance assessment: Needs assistance Sitting-balance support: No upper extremity supported, Feet supported Sitting balance-Leahy Scale: Good Sitting balance - Comments: Seated EOB   Standing balance support: During functional activity, Bilateral upper extremity supported Standing balance-Leahy Scale: Fair Standing balance comment: with RW                             Pertinent Vitals/Pain Pain Assessment Pain Assessment: Faces Faces  Pain Scale: Hurts a little bit Pain Location: Abdomen Pain Descriptors / Indicators: Discomfort Pain Intervention(s): Monitored during session    Home Living Family/patient expects to be discharged to:: Private residence Living Arrangements:  Non-relatives/Friends Available Help at Discharge: Friend(s);Available PRN/intermittently Type of Home: House Home Access: Stairs to enter Entrance Stairs-Rails: Right Entrance Stairs-Number of Steps: 5   Home Layout: One level Home Equipment: Agricultural consultant (2 wheels);Wheelchair - manual;Crutches;Hand held shower head Additional Comments: Pt reports she lives with a female roommate who is a Naval architect and gone throughout the week but available on the weekends.    Prior Function Prior Level of Function : Independent/Modified Independent;History of Falls (last six months)       Physical Assist : ADLs (physical)   ADLs (physical): IADLs Mobility Comments: Pt reports she is mostly a household ambulator and uses furniture and walls for stability. Reports use of w/c some days when in more pain while doing iADLs (laundry). Reports little ambulation outside of home recently ADLs Comments: Pt reports she is having trouble getting in and out of the tub at home. pt reports she fatigues trying to stand while bathing and is limited for cleaning herself. Pt able to compelte other ADL's.     Extremity/Trunk Assessment   Upper Extremity Assessment Upper Extremity Assessment: Generalized weakness    Lower Extremity Assessment Lower Extremity Assessment: Generalized weakness    Cervical / Trunk Assessment Cervical / Trunk Assessment: Normal  Communication   Communication Communication: No apparent difficulties    Cognition Arousal: Alert Behavior During Therapy: WFL for tasks assessed/performed   PT - Cognitive impairments: No apparent impairments                         Following commands: Intact       Cueing Cueing Techniques: Verbal cues, Tactile cues     General Comments      Exercises     Assessment/Plan    PT Assessment Patient needs continued PT services  PT Problem List Decreased strength;Decreased balance;Decreased mobility;Decreased activity  tolerance;Pain       PT Treatment Interventions Therapeutic activities;Gait training;Therapeutic exercise;DME instruction;Patient/family education;Stair training;Balance training;Functional mobility training    PT Goals (Current goals can be found in the Care Plan section)  Acute Rehab PT Goals Patient Stated Goal: Return home PT Goal Formulation: With patient Time For Goal Achievement: 09/17/23 Potential to Achieve Goals: Good    Frequency Min 3X/week     Co-evaluation               AM-PAC PT 6 Clicks Mobility  Outcome Measure Help needed turning from your back to your side while in a flat bed without using bedrails?: A Little Help needed moving from lying on your back to sitting on the side of a flat bed without using bedrails?: A Little Help needed moving to and from a bed to a chair (including a wheelchair)?: A Little Help needed standing up from a chair using your arms (e.g., wheelchair or bedside chair)?: A Little Help needed to walk in hospital room?: A Little Help needed climbing 3-5 steps with a railing? : A Lot 6 Click Score: 17    End of Session Equipment Utilized During Treatment: Gait belt Activity Tolerance: Patient tolerated treatment well Patient left: in bed;with call bell/phone within reach Nurse Communication: Mobility status PT Visit Diagnosis: Unsteadiness on feet (R26.81);Muscle weakness (generalized) (M62.81);Other abnormalities of gait and mobility (R26.89)    Time: 8885-8862 PT  Time Calculation (min) (ACUTE ONLY): 23 min   Charges:     PT Treatments $Gait Training: 8-22 mins $Therapeutic Activity: 8-22 mins PT General Charges $$ ACUTE PT VISIT: 1 Visit         1:59 PM, 09/03/23,  Onnie Como, SPT

## 2023-09-03 NOTE — Progress Notes (Signed)
 Cell count negative for SBP.

## 2023-09-03 NOTE — Progress Notes (Signed)
 Patient brought to US  1 per stretcher, in no acute distress. Paracentesis explained, consent obtained. Local anesthetic admin without adverse reaction. Access obtained at RLQ , pink tinged serous fluid retrieved for labs. 800cc total collected.

## 2023-09-03 NOTE — Plan of Care (Signed)
  Problem: Acute Rehab OT Goals (only OT should resolve) Goal: Pt. Will Perform Lower Body Dressing Flowsheets (Taken 09/03/2023 1128) Pt Will Perform Lower Body Dressing: with modified independence Goal: Pt. Will Transfer To Toilet Flowsheets (Taken 09/03/2023 1128) Pt Will Transfer to Toilet: with modified independence Goal: Pt/Caregiver Will Perform Home Exercise Program Flowsheets (Taken 09/03/2023 1128) Pt/caregiver will Perform Home Exercise Program:  Increased strength  Both right and left upper extremity  Independently  Braidyn Scorsone OT, MOT

## 2023-09-04 ENCOUNTER — Telehealth: Payer: Self-pay | Admitting: Gastroenterology

## 2023-09-04 DIAGNOSIS — K2961 Other gastritis with bleeding: Secondary | ICD-10-CM

## 2023-09-04 DIAGNOSIS — K254 Chronic or unspecified gastric ulcer with hemorrhage: Secondary | ICD-10-CM

## 2023-09-04 LAB — COMPREHENSIVE METABOLIC PANEL WITH GFR
ALT: 19 U/L (ref 0–44)
AST: 52 U/L — ABNORMAL HIGH (ref 15–41)
Albumin: 1.9 g/dL — ABNORMAL LOW (ref 3.5–5.0)
Alkaline Phosphatase: 53 U/L (ref 38–126)
Anion gap: 4 — ABNORMAL LOW (ref 5–15)
BUN: <5 mg/dL — ABNORMAL LOW (ref 6–20)
CO2: 24 mmol/L (ref 22–32)
Calcium: 7.7 mg/dL — ABNORMAL LOW (ref 8.9–10.3)
Chloride: 102 mmol/L (ref 98–111)
Creatinine, Ser: 0.4 mg/dL — ABNORMAL LOW (ref 0.44–1.00)
GFR, Estimated: >60 mL/min (ref >60)
Glucose, Bld: 118 mg/dL — ABNORMAL HIGH (ref 70–99)
Potassium: 3.8 mmol/L (ref 3.5–5.1)
Sodium: 130 mmol/L — ABNORMAL LOW (ref 135–145)
Total Bilirubin: 1 mg/dL (ref 0.0–1.2)
Total Protein: 5.4 g/dL — ABNORMAL LOW (ref 6.5–8.1)

## 2023-09-04 LAB — CBC
HCT: 27.1 % — ABNORMAL LOW (ref 36.0–46.0)
Hemoglobin: 8.5 g/dL — ABNORMAL LOW (ref 12.0–15.0)
MCH: 29.4 pg (ref 26.0–34.0)
MCHC: 31.4 g/dL (ref 30.0–36.0)
MCV: 93.8 fL (ref 80.0–100.0)
Platelets: 107 K/uL — ABNORMAL LOW (ref 150–400)
RBC: 2.89 MIL/uL — ABNORMAL LOW (ref 3.87–5.11)
RDW: 19.9 % — ABNORMAL HIGH (ref 11.5–15.5)
WBC: 3.4 K/uL — ABNORMAL LOW (ref 4.0–10.5)
nRBC: 0 % (ref 0.0–0.2)

## 2023-09-04 LAB — MAGNESIUM: Magnesium: 1.7 mg/dL (ref 1.7–2.4)

## 2023-09-04 MED ORDER — SUCRALFATE 1 G PO TABS: 1.0000 g | ORAL_TABLET | Freq: Three times a day (TID) | ORAL | 0 refills | Status: DC

## 2023-09-04 MED ORDER — MELATONIN 3 MG PO TABS: 6.0000 mg | ORAL_TABLET | Freq: Every day | ORAL | Status: DC

## 2023-09-04 MED ORDER — MORPHINE SULFATE (PF) 2 MG/ML IV SOLN: 1.0000 mg | Freq: Three times a day (TID) | INTRAVENOUS | Status: DC | PRN

## 2023-09-04 MED ORDER — DOCUSATE SODIUM 100 MG PO CAPS: 100.0000 mg | ORAL_CAPSULE | Freq: Two times a day (BID) | ORAL | 2 refills | Status: DC

## 2023-09-04 MED ORDER — DOCUSATE SODIUM 100 MG PO CAPS: 100.0000 mg | ORAL_CAPSULE | Freq: Every day | ORAL | 2 refills | Status: DC | PRN

## 2023-09-04 NOTE — TOC Progression Note (Addendum)
 Transition of Care [facility]) - Progression Note    Patient Details  Name: Joan [name] MRN: [id number] Date of Birth: September 20, 1970  Transition of Care [facility]) CM/SW Contact  Mcarthur Saddie Kim, [name] Phone Number: 09/04/2023, 8:20 AM  Clinical Narrative: LCSW discussed outpatient PT/OT with pt who is agreeable to [facility]. Referral made. Pt also requests tub bench. No preference on agency. Referred to Zach with [employer]. Order in.     Update: Per Darlyn, no qualifying diagnosis for tub bench. Pt notified and [name] contact [employer] [facility] as she does not wish to private pay.   Expected Discharge Plan: Home/Self Care Barriers to Discharge: Continued Medical Work up               Expected Discharge Plan and Services In-house Referral: Clinical Social Work Discharge Planning Services: CM Consult   Living arrangements for the past 2 months: Single Family Home                                       Social Drivers of Health (SDOH) Interventions SDOH Screenings   Food Insecurity: No Food Insecurity (09/01/2023)  Housing: Low Risk  (09/01/2023)  Transportation Needs: No Transportation Needs (09/01/2023)  Recent Concern: Transportation Needs - Unmet Transportation Needs (08/20/2023)  Utilities: Not At Risk (09/01/2023)  Depression (PHQ2-9): High Risk (07/30/2023)  Social Connections: Moderately Isolated (09/01/2023)  Tobacco Use: High Risk (09/03/2023)    Readmission Risk Interventions    09/03/2023   12:31 PM 09/02/2023    9:01 AM 08/20/2023    8:21 AM  Readmission Risk Prevention Plan  Transportation Screening Complete Complete Complete  PCP or Specialist Appt within 3-5 Days  Complete   HRI or Home Care Consult  Complete Complete  Social Work Consult for Recovery Care Planning/Counseling  Complete Complete  Palliative Care Screening  Not Applicable Not Applicable  Medication Review Oceanographer) Complete Complete Complete  HRI or Home Care Consult  Complete    SW Recovery Care/Counseling Consult Complete    Palliative Care Screening Not Applicable    Skilled Nursing Facility Patient Refused

## 2023-09-04 NOTE — Discharge Summary (Signed)
 Physician Discharge Summary   Patient: Joan [name] MRN: [id number] DOB: November 09, 1970  Admit date:     08/31/2023  Discharge date: 09/04/23  Discharge Physician: Adriana DELENA Grams   PCP: Zarwolo, Gloria, FNP   Recommendations at discharge:   Follow-up with PCP in 1 week follow Follow-up with general surgery in 1 week Advance your diet to regular slowly Patient advised to avoid narcotics as possible Continue home health, PT OT  Discharge Diagnoses: Principal Problem:   GI bleed Active Problems:   Hypokalemia   Diarrhea   Alcohol use disorder   Nausea and vomiting   Hyponatremia   Hypomagnesemia   Abdominal pain   Lactic acidosis   Elevated lipase   Liver cirrhosis (HCC)  Resolved Problems:   * No resolved hospital problems. *  Hospital Course: Joan [name] is a 53 year old female, lives with her roommate, ambulates with the help of a cane, medical history significant for s/p exploratory laparotomy for perforated gastric ulcer repair with omental patch on 7/27, ongoing alcohol use disorder, tobacco abuse, cirrhosis, HTN, GERD, anxiety/depression, multiple hospitalizations (since her sixth hospital admission this year), presented to the ED with complaints of couple days of pain across upper abdomen, severe, associated with multiple episodes of nonbloody emesis and few episodes of black tarry stools in the context of ongoing alcohol use, drank about half a can of a 12 ounce beer on the morning of admission.  Denies NSAID use.   Admitted for nausea, vomiting, abdominal pain, possible upper GI bleed, multiple electrolyte abnormalities.  Clinically improving.     Nausea, vomiting, abdominal pain, possible acute upper GI bleed -Resolved hemodynamically stable now FOBT positive. CT A/P with contrast 8/22: Resolution of previously seen free air.  No findings to suggest perforation at this time.  Persistent ulceration within the gastric wall related to prior perforated ulcer with  repair. GI consulted.  Discussed with Dr. Eartha 8/23 who recommends no endoscopy at this time, advance to soft diet, twice daily IV PPI, sucralfate . Lipase minimally elevated at 82, unclear significance but would not change supportive care management. No diarrhea.  Discontinued contact isolation and stool testing.  Tolerating some diet, up to 30% documented last evening.  Advised patient to minimize use of IV pain meds and antiemetics and try out p.o. meds.  Also advised her that she probably is going to have ongoing abdominal pain/discomfort, nausea for several days even a couple of weeks until adequate healing of her GI tract from recent surgery, alcohol use disorder etc.  She verbalized understanding.   Ascites  - S/p paracentesis yielding 800 mL fluid, analysis no signs of SBP - No need for antibiotics   chronic GI blood loss anemia Hemoglobin on admission 10.1. >>>  8.5 Fluctuating overall stable around 8    Latest Ref Rng & Units 09/04/2023    4:30 AM 09/03/2023    5:28 AM 09/02/2023    4:48 AM  CBC  WBC 4.0 - 10.5 K/uL 3.4  4.1  4.2   Hemoglobin 12.0 - 15.0 g/dL 8.5  8.7  8.8   Hematocrit 36.0 - 46.0 % 27.1  27.2  26.6   Platelets 150 - 400 K/uL 107  105  121         Severe hypokalemia-was repleted    Hypomagnesemia-repleted    Elevated lactate Secondary to cirrhosis and hypoperfusion from GI losses IV fluids and resolved.   Hyponatremia, chronic Secondary to alcohol/beer use, cirrhosis, intravascular volume depletion from GI losses and poor  oral intake Presented with serum sodium of 125. Improved and stable with serum sodiums fluctuating in the high 120s-130.   Alcohol use disorder Blood alcohol level 139 on admission. Absolute abstinence counseled CIWA protocol, scores 0.  No overt withdrawal.   Tobacco use disorder Absolute cessation counseled Patient declines nicotine  patch   Thrombocytopenia Chronic and intermittent secondary to cirrhosis and alcohol  use.  Stable.   Cirrhosis No clinical ascites.  INR 1.3. Liver biopsy from 08/05/2023: Pathology shows mild to moderately active steatohepatitis with cirrhosis.   S/p exploratory laparotomy 7/27 Still had staples in place which were removed on 8/23.  Outpatient follow-up with general surgery.   Body mass index is 23.46 kg/m.     Consultants: Gastroenterology/general surgery Procedures performed: None Disposition: Home Diet recommendation:  Discharge Diet Orders (From admission, onward)     Start     Ordered   09/04/23 0000  Diet - low sodium heart healthy        09/04/23 0911           Clear liquid diet, advance as tolerated DISCHARGE MEDICATION: Allergies as of 09/04/2023       Reactions   Bee Venom Anaphylaxis   Cannot take epi for this allergy d/t heart condition per pt   Fire Ant Hives, Rash   Latex Rash        Medication List     STOP taking these medications    Misc. Devices Misc   Potassium Chloride  ER 20 MEQ Tbcr   vancomycin  125 MG capsule Commonly known as: VANCOCIN        TAKE these medications    bismuth subsalicylate 262 MG/15ML suspension Commonly known as: PEPTO BISMOL Take 30 mLs by mouth every 6 (six) hours as needed for diarrhea or loose stools.   docusate sodium  100 MG capsule Commonly known as: Colace Take 1 capsule (100 mg total) by mouth daily as needed for mild constipation.   Fitrite Back Brace with Pulley Misc 1 Units by Does not apply route daily.   hydrocortisone  25 MG suppository Commonly known as: ANUSOL -HC Place 1 suppository (25 mg total) rectally 2 (two) times daily.   omeprazole  40 MG capsule Commonly known as: PRILOSEC Take 1 capsule (40 mg total) by mouth 2 (two) times daily.   ondansetron  4 MG disintegrating tablet Commonly known as: ZOFRAN -ODT Take 1 tablet (4 mg total) by mouth every 8 (eight) hours as needed for nausea or vomiting.   oxyCODONE  5 MG immediate release tablet Commonly known as: Oxy  IR/ROXICODONE  Take 1 tablet (5 mg total) by mouth every 6 (six) hours as needed for severe pain (pain score 7-10) or breakthrough pain.   sertraline  50 MG tablet Commonly known as: ZOLOFT  Take 1 tablet (50 mg total) by mouth daily.   sucralfate  1 g tablet Commonly known as: CARAFATE  Take 1 tablet (1 g total) by mouth 4 (four) times daily -  with meals and at bedtime for 10 days.   Vitamin D  (Ergocalciferol ) 1.25 MG (50000 UNIT) Caps capsule Commonly known as: DRISDOL  Take 1 capsule (50,000 Units total) by mouth every 7 (seven) days.               Durable Medical Equipment  (From admission, onward)           Start     Ordered   09/04/23 0811  For home use only DME Tub bench  Once        09/04/23 0810  Discharge Care Instructions  (From admission, onward)           Start     Ordered   09/04/23 0000  Discharge wound care:       Comments: Wound care instructions per wound care RN, general surgery   09/04/23 0911            Follow-up Information     [employer] medical equipment supplier. Call.   Why: Please call or go to this charity DME supplier to see if they have any free shower chairs or any other necessary equipment you may need. Contact information: Address: [address] [name] [address]  Phone : [phone]               Discharge Exam: Fredricka Weights   08/31/23 2144 09/01/23 0240  Weight: 55.3 kg 58.2 kg        General:  AAO x 3,  cooperative, no distress;   HEENT:  Normocephalic, PERRL, otherwise with in Normal limits   Neuro:  CNII-XII intact. , normal motor and sensation, reflexes intact   Lungs:   Clear to auscultation BL, Respirations unlabored,  No wheezes / crackles  Cardio:    S1/S2, RRR, No murmure, No Rubs or Gallops   Abdomen:  Soft, mild tenderness at surgical site, surgical abdominal wound clean, minimal tenderness, negative any edema erythema or drainage  bowel sounds active all four  quadrants, no guarding or peritoneal signs.  Muscular  skeletal:  Limited exam -global generalized weaknesses - in bed, able to move all 4 extremities,   2+ pulses,  symmetric, No pitting edema  Skin:  Dry, warm to touch, negative for any Rashes,  Wounds: Please see nursing documentation          Condition at discharge: fair  The results of significant diagnostics from this hospitalization (including imaging, microbiology, ancillary and laboratory) are listed below for reference.   Imaging Studies: US  Paracentesis Result Date: 09/03/2023 INDICATION: 53 year old female. History of gastric ulcer perforation status post gastrorrhaphy and ex lap. Found to have ascites. Request for therapeutic and diagnostic paracentesis EXAM: ULTRASOUND GUIDED THERAPEUTIC AND DIAGNOSTIC PARACENTESIS MEDICATIONS: Lidocaine  1% 10 mL COMPLICATIONS: None immediate. PROCEDURE: Informed written consent was obtained from the patient after a discussion of the risks, benefits and alternatives to treatment. A timeout was performed prior to the initiation of the procedure. Initial ultrasound scanning demonstrates a small amount of ascites within the right lower abdominal quadrant. The right lower abdomen was prepped and draped in the usual sterile fashion. 1% lidocaine  was used for local anesthesia. Following this, a 6 Fr Safe-T-Centesis catheter was introduced. An ultrasound image was saved for documentation purposes. The paracentesis was performed. The catheter was removed and a dressing was applied. The patient tolerated the procedure well without immediate post procedural complication. FINDINGS: A total of approximately 800 mL of serosanguineous fluid was removed. Samples were sent to the laboratory as requested by the clinical team. IMPRESSION: Successful ultrasound-guided therapeutic and diagnostic right-sided paracentesis yielding 800 mL of serosanguineous peritoneal fluid. Performed by Delon Beagle NP  Electronically Signed   By: Wilkie Lent M.D.   On: 09/03/2023 14:51   CT ABDOMEN PELVIS W CONTRAST Result Date: 08/31/2023 CLINICAL DATA:  Acute abdominal pain, history of prior gastric ulcer perforation EXAM: CT ABDOMEN AND PELVIS WITH CONTRAST TECHNIQUE: Multidetector CT imaging of the abdomen and pelvis was performed using the standard protocol following bolus administration of intravenous contrast. RADIATION DOSE REDUCTION: This exam was performed according  to the departmental dose-optimization program which includes automated exposure control, adjustment of the mA and/or kV according to patient size and/or use of iterative reconstruction technique. CONTRAST:  OMNIPAQUE  IOHEXOL  300 MG/ML  SOLN COMPARISON:  08/19/2023 FINDINGS: Lower chest: Lung bases are free of acute infiltrate or sizable effusion. Hepatobiliary: Liver is diffusely decreased in attenuation. Gallbladder is partially distended. Perihepatic fluid is noted stable from the prior exam. Pancreas: Pancreas is well visualized and within normal limits. Spleen: Normal in size without focal abnormality. Minimal perisplenic fluid is seen. Adrenals/Urinary Tract: Adrenal glands are within normal limits. Kidneys demonstrate normal enhancement bilaterally. No renal calculi or obstructive changes are seen. The bladder is well distended. Stomach/Bowel: No obstructive or inflammatory changes of the colon are seen. The appendix is not well visualized. Small bowel is within normal limits. There remains a large air-filled defect of the gastric wall consistent with the known previous perforated ulcer with subsequent repair. This is stable from the prior exam. The previously seen free air in the left upper quadrant is no longer identified. The previously seen surgical drain has been removed in the interval. Vascular/Lymphatic: Aortic atherosclerosis. No enlarged abdominal or pelvic lymph nodes. Reproductive: Uterus and bilateral adnexa are unremarkable.  Other: No abdominal wall hernia or abnormality. No abdominopelvic ascites. Musculoskeletal: No acute or significant osseous findings. IMPRESSION: Minimal ascites similar to that seen on the prior exam. Resolution of previously seen free air. No findings to suggest perforation are noted at this time. Persistent ulceration within the gastric wall related to prior perforated ulcer with repair. No evidence of perforation is seen at this time. Electronically Signed   By: Oneil Devonshire M.D.   On: 08/31/2023 23:16   US  ASCITES (ABDOMEN LIMITED) Result Date: 08/27/2023 INDICATION: see if she has sufficient ascites for paracentesis 53 year old female. History of perforation of the anterior gastric wall secondary to ulcers. Found to have ascites. Request is therapeutic paracentesis. EXAM: ULTRASOUND ABDOMEN LIMITED FINDINGS: Imaging of all 4 quadrants of the abdomen reveal small amount of ascites. IMPRESSION: Small volume of ascites. Given the given the limited volume, the patient declined the procedure at this time. Performed by: Delon Beagle, NP Electronically Signed   By: Thom Hall M.D.   On: 08/27/2023 09:01   DG UGI W SINGLE CM (SOL OR THIN BA) Result Date: 08/20/2023 CLINICAL DATA:  Patient with history of perforation of the anterior gastric wall secondary to ulcer status post repair 08/05/2023. Upper GI series 08/10/2023 negative for contrast extravasation and patient was discharged shortly after. She return to the ED yesterday with complaints of abdominal pain in CT abdomen pelvis showed concern for small amount of free air in the left upper quadrant worrisome for perforation. Request for limited upper GI to rule out postoperative perforation. EXAM: DG UGI W SINGLE CM TECHNIQUE: Scout radiograph was obtained. Single contrast examination was performed using thin liquid barium. This exam was performed by Clotilda Hesselbach, PA-C, and was supervised and interpreted by Ester Sides, MD. FLUOROSCOPY: Radiation  Exposure Index (as provided by the fluoroscopic device): 33.10 mGy Kerma COMPARISON:  Upper GI series 08/10/2023, CT abdomen pelvis with contrast 08/19/2023 FINDINGS: Scout Radiograph: Nonspecific bowel gas pattern. Multiple leads overlying the abdomen. Midline surgical staples in place. Surgical drain exiting left upper quadrant with distal tip in the right upper quadrant. Contrast within the urinary bladder. Stomach: Normal appearance. Contrast fills the stomach without difficulty. No evidence of contrast extravasation in the supine AP, right oblique, left oblique, right lateral, left lateral  or prone positions. Gastric emptying: Normal. Duodenum:  Normal appearance on limited exam. IMPRESSION: Single-contrast upper GI without evidence of contrast extravasation. Electronically Signed   By: Ester Sides M.D.   On: 08/20/2023 16:07   CT ABDOMEN PELVIS W CONTRAST Result Date: 08/19/2023 CLINICAL DATA:  Acute abdominal pain EXAM: CT ABDOMEN AND PELVIS WITH CONTRAST TECHNIQUE: Multidetector CT imaging of the abdomen and pelvis was performed using the standard protocol following bolus administration of intravenous contrast. RADIATION DOSE REDUCTION: This exam was performed according to the departmental dose-optimization program which includes automated exposure control, adjustment of the mA and/or kV according to patient size and/or use of iterative reconstruction technique. CONTRAST:  OMNIPAQUE  IOHEXOL  300 MG/ML  SOLN COMPARISON:  CT abdomen and pelvis 08/13/2023 FINDINGS: Lower chest: No acute abnormality. Hepatobiliary: The liver is mildly enlarged. There is a stable 8 mm rounded hypodense area in the inferior right lobe of the liver, unchanged. Slightly nodular liver contour present. There is diffuse gallbladder wall edema similar to the prior study. No radiopaque calculi are identified. No biliary ductal dilatation. Pancreas: Unremarkable. No pancreatic ductal dilatation or surrounding inflammatory  changes. Spleen: Normal in size without focal abnormality. Adrenals/Urinary Tract: Adrenal glands are unremarkable. Kidneys are normal, without renal calculi, focal lesion, or hydronephrosis. Bladder is unremarkable. Stomach/Bowel: There is wall thickening and edema of the ascending colon and proximal transverse colon. The appendix is unremarkable. No dilated bowel loops are present. There is also diffuse wall thickening and edema of the gastric antrum with findings worrisome for gastric ulceration along the anterior antrum. Vascular/Lymphatic: Aorta and IVC are normal in size. There are atherosclerotic calcifications of the aorta. No enlarged lymph nodes are seen. Reproductive: Uterus and bilateral adnexa are unremarkable. Other: There is a small amount of free fluid throughout the abdomen and pelvis which has minimally increased. There is a small amount of free air in the left upper quadrant which is new from prior. Percutaneous drainage catheter is again seen with distal tip adjacent to the gallbladder, unchanged in position. No focal abscess identified. No significant abdominal wall hernia. Musculoskeletal: Mild chronic compression deformity T12 is unchanged. IMPRESSION: 1. New small amount of free air in the left upper quadrant worrisome for bowel perforation. 2. Diffuse wall thickening and edema of the gastric antrum with findings worrisome for gastric ulceration along the anterior antrum. This may be source or free air. 3. Wall thickening and edema of the ascending colon and proximal transverse colon worrisome for colitis. 4. Increasing small amount of free fluid throughout the abdomen and pelvis. 5. Unchanged percutaneous drainage catheter with distal tip adjacent to the gallbladder. No focal abscess identified. 6. Unchanged diffuse gallbladder wall edema. 7. Stable mild hepatomegaly with slightly nodular liver contour. Correlate clinically for cirrhosis. 8. Aortic atherosclerosis. Aortic Atherosclerosis  (ICD10-I70.0). Electronically Signed   By: Greig Pique M.D.   On: 08/19/2023 23:53   DG Chest Port 1 View Result Date: 08/19/2023 CLINICAL DATA:  upper abd pain EXAM: PORTABLE CHEST 1 VIEW COMPARISON:  Chest x-ray 08/04/2023 FINDINGS: The heart and mediastinal contours are unchanged. Atherosclerotic plaque. No focal consolidation. No pulmonary edema. No pleural effusion. No pneumothorax. No acute osseous abnormality. IMPRESSION: 1. No active disease. 2.  Aortic Atherosclerosis (ICD10-I70.0). Electronically Signed   By: Morgane  Naveau M.D.   On: 08/19/2023 22:50   CT ABDOMEN PELVIS W CONTRAST Result Date: 08/13/2023 CLINICAL DATA:  Abdominal pain, post-op. EXAM: CT ABDOMEN AND PELVIS WITH CONTRAST TECHNIQUE: Multidetector CT imaging of the abdomen and  pelvis was performed using the standard protocol following bolus administration of intravenous contrast. RADIATION DOSE REDUCTION: This exam was performed according to the departmental dose-optimization program which includes automated exposure control, adjustment of the mA and/or kV according to patient size and/or use of iterative reconstruction technique. CONTRAST:  OMNIPAQUE  IOHEXOL  300 MG/ML  SOLN COMPARISON:  CT scan abdomen and pelvis from 08/05/2023. FINDINGS: Lower chest: There are patchy atelectatic changes in the visualized lung bases. No overt consolidation. No pleural effusion. The heart is normal in size. No pericardial effusion. Hepatobiliary: The liver is normal in size. Non-cirrhotic configuration. There is subtle liver surface irregularity/nodularity. Redemonstration of diffuse heterogeneous hypoattenuating liver attenuation, similar to the prior study. The portal vein and its major tributaries are patent. There is a stable approximately 9 x 11 mm hypoattenuating lesion in the right hepatic lobe, which is too small to adequately characterize. No intrahepatic or extrahepatic bile duct dilation. No calcified gallstones. Normal gallbladder  wall thickness. No pericholecystic inflammatory changes. Pancreas: Unremarkable. No pancreatic ductal dilatation or surrounding inflammatory changes. Spleen: Within normal limits. No focal lesion. Adrenals/Urinary Tract: Adrenal glands are unremarkable. No suspicious renal mass. No hydronephrosis. No renal or ureteric calculi. There is mild irregular thickening of the anterior wall of urinary bladder. However, no perivesical fat stranding. Findings may represent chronic versus acute cystitis. Correlate clinically and with urinalysis. No focal bladder mass or bladder calculi. Stomach/Bowel: No disproportionate dilation of the small or large bowel loops. No evidence of abnormal bowel wall thickening or inflammatory changes. The appendix is unremarkable. Vascular/Lymphatic: Trace amount of ascites between the gallbladder and right hepatic lobe. No pneumoperitoneum. No abdominal or pelvic lymphadenopathy, by size criteria. No aneurysmal dilation of the major abdominal arteries. There are mild peripheral atherosclerotic vascular calcifications of the aorta and its major branches. Reproductive: The uterus is unremarkable. No large adnexal mass. Other: Midline epigastric surgical scar and skin staples noted. There is a JP drain inserted from left paramedian epigastric approach with its tip in the right upper quadrant just inferior to the gallbladder. There is trace amount of fluid between the gallbladder and right hepatic lobe. No walled-off abscess. There is a tiny fat containing right inguinal hernia the soft tissues and abdominal wall are otherwise unremarkable. Musculoskeletal: No suspicious osseous lesions. There are mild multilevel degenerative changes in the visualized spine. IMPRESSION: 1. There is a JP-drain inserted from left paramedian epigastric approach with its tip in the right upper quadrant just inferior to the gallbladder. There is trace amount of fluid between the gallbladder and right hepatic lobe. No  walled-off abscess. 2. There is mild irregular thickening of the anterior wall of urinary bladder. However, no perivesical fat stranding. Findings may represent chronic versus acute cystitis. Correlate clinically and with urinalysis. 3. Multiple other nonacute observations, as described above. Aortic Atherosclerosis (ICD10-I70.0). Electronically Signed   By: Ree Molt M.D.   On: 08/13/2023 13:35   DG UGI W SINGLE CM (SOL OR THIN BA) Result Date: 08/10/2023 CLINICAL DATA:  Status post surgical repair of perforated gastric ulcer. Request for limited upper GI to evaluate for leak. EXAM: DG UGI W SINGLE CM TECHNIQUE: Single contrast examination was then performed using water-soluble contrast. Contrast injected down patient's existing nasogastric tube into the stomach. This exam was performed by Franky Rusk PA-C , and was supervised and interpreted by Dr. Ester Sides. FLUOROSCOPY: Radiation Exposure Index (as provided by the fluoroscopic device): 44.50 mGy Kerma COMPARISON:  None Available. FINDINGS: Midline surgical staples consistent with open  abdominal surgery. Nasogastric tube with tip terminating in the body of the stomach. Surgical drain noted overlying the expected surgical site. Contrast fills the stomach without difficulty. Images taken in AP, various obliquities, and right lateral decubitus positions. No definitive evidence of contrast extravasation from the expected surgical site. Normal filling of the pylorus with prompt gastric emptying. Normal caliber duodenum with active peristalsis. IMPRESSION: No definitive contrast extravasation or leak from expected gastric perforation surgical repair site. Electronically Signed   By: Ester Sides M.D.   On: 08/10/2023 11:37   DG Abd 1 View Result Date: 08/07/2023 CLINICAL DATA:  Nasogastric tube EXAM: ABDOMEN - 1 VIEW COMPARISON:  Abdominal x-ray 08/05/2023 FINDINGS: Nasogastric tube tip at the level of the gastric antrum. Surgical drain and skin staples  are again seen in the mid abdomen. Right upper extremity PICC terminates over the distal SVC. The lungs appear clear. There is no pneumothorax. The cardiomediastinal silhouette is within normal limits. IMPRESSION: Nasogastric tube tip at the level of the gastric antrum. Electronically Signed   By: Greig Pique M.D.   On: 08/07/2023 22:29   US  Abdomen Limited Result Date: 08/06/2023 CLINICAL DATA:  Evaluate for ascites. EXAM: LIMITED ABDOMEN ULTRASOUND FOR ASCITES TECHNIQUE: Limited ultrasound survey for ascites was performed in all four abdominal quadrants. COMPARISON:  CT abdomen August 05, 2023 FINDINGS: No free fluid within the limited images provided in 4 quadrants. IMPRESSION: No ascites identified on current exam and may be obscured by bowel gas (on recent CT from 08/05/2023 there is ascites in perihepatic, perisplenic and pelvic cavity). Electronically Signed   By: Megan  Zare M.D.   On: 08/06/2023 16:53   US  EKG SITE RITE Result Date: 08/06/2023 If Site Rite image not attached, placement could not be confirmed due to current cardiac rhythm.   Microbiology: Results for orders placed or performed during the hospital encounter of 08/31/23  Culture, body fluid w Gram Stain-bottle     Status: None (Preliminary result)   Collection Time: 09/03/23  2:30 PM   Specimen: Peritoneal Washings  Result Value Ref Range Status   Specimen Description PERITONEAL BOTTLES DRAWN AEROBIC AND ANAEROBIC  Final   Special Requests   Final    Blood Culture adequate volume Performed at [facility], [address] [place], [name] [address]    Culture PENDING  Incomplete   Report Status PENDING  Incomplete  Gram stain     Status: None   Collection Time: 09/03/23  2:30 PM   Specimen: Peritoneal Washings  Result Value Ref Range Status   Specimen Description PERITONEAL  Final   Special Requests PERITONEAL  Final   Gram Stain   Final    PLEURAL NO ORGANISMS SEEN WBC PRESENT,BOTH PMN AND MONONUCLEAR CYTOSPIN  SMEAR Performed at [facility], [address], [place], [name] [address]    Report Status 09/03/2023 FINAL  Final    Labs: CBC: Recent Labs  Lab 08/31/23 2152 08/31/23 2240 09/01/23 0600 09/01/23 1543 09/02/23 0448 09/03/23 0528 09/04/23 0430  WBC 5.9  --  4.4 5.9 4.2 4.1 3.4*  NEUTROABS 3.8  --   --   --   --   --   --   HGB 10.1*   < > 8.9* 9.7* 8.8* 8.7* 8.5*  HCT 30.3*   < > 26.8* 29.6* 26.6* 27.2* 27.1*  MCV 88.3  --  89.3 91.4 91.4 93.2 93.8  PLT 148*  --  129* 125* 121* 105* 107*   < > = values in  this interval not displayed.   Basic Metabolic Panel: Recent Labs  Lab 08/31/23 2152 08/31/23 2240 09/01/23 0600 09/01/23 1543 09/02/23 0448 09/03/23 0528 09/04/23 0430  NA 125*   < > 130* 128* 129* 129* 130*  K 2.6*   < > 2.9* 4.8 3.8 3.3* 3.8  CL 86*   < > 93* 95* 100 100 102  CO2 24  --  26 23 24 25 24   GLUCOSE 86   < > 74 77 89 95 118*  BUN <5*   < > <5* <5* <5* <5* <5*  CREATININE 0.39*   < > 0.36* 0.42* 0.47 0.43* 0.40*  CALCIUM  8.0*  --  7.4* 7.7* 7.6* 7.7* 7.7*  MG 1.6*  --  1.8  --  1.6* 1.7 1.7   < > = values in this interval not displayed.   Liver Function Tests: Recent Labs  Lab 08/31/23 2152 09/01/23 0600 09/02/23 0448 09/03/23 0528 09/04/23 0430  AST 56* 43* 41 49* 52*  ALT 22 18 16 17 19   ALKPHOS 76 63 56 60 53  BILITOT 1.5* 1.4* 1.1 1.0 1.0  PROT 7.2 6.0* 5.7* 5.7* 5.4*  ALBUMIN 2.5* 2.1* 1.9* 1.8* 1.9*   CBG: No results for input(s): GLUCAP in the last 168 hours.  Discharge time spent: greater than 30 minutes.  Signed: Adriana DELENA Grams, MD [facility] 09/04/2023

## 2023-09-04 NOTE — Progress Notes (Signed)
 Pt provided [employer] bus voucher for ride home per pt's request,

## 2023-09-04 NOTE — Progress Notes (Signed)
 PROGRESS NOTE    Patient: Joan [name]                            PCP: Zarwolo, Gloria, FNP                    DOB: [birth year]            DOA: 08/31/2023 FMW:[id number]             DOS: 09/04/2023, 7:27 AM   LOS: 3 days   Date of Service: The patient was seen and examined on 09/04/2023  Subjective:   The patient was seen and examined this morning. Hemodynamically stable. No issues overnight .  Brief Narrative:   Joan [name] is a 53 year old female, lives with her roommate, ambulates with the help of a cane, medical history significant for s/p exploratory laparotomy for perforated gastric ulcer repair with omental patch on 7/27, ongoing alcohol use disorder, tobacco abuse, cirrhosis, HTN, GERD, anxiety/depression, multiple hospitalizations (since her sixth hospital admission this year), presented to the ED with complaints of couple days of pain across upper abdomen, severe, associated with multiple episodes of nonbloody emesis and few episodes of black tarry stools in the context of ongoing alcohol use, drank about half a can of a 12 ounce beer on the morning of admission.  Denies NSAID use.   Admitted for nausea, vomiting, abdominal pain, possible upper GI bleed, multiple electrolyte abnormalities.  Clinically improving.      Assessment & Plan:   Principal Problem:   GI bleed Active Problems:   Hypokalemia   Diarrhea   Alcohol use disorder   Nausea and vomiting   Hyponatremia   Hypomagnesemia   Abdominal pain   Lactic acidosis   Elevated lipase   Liver cirrhosis (HCC)  Nausea, vomiting, abdominal pain, possible acute upper GI bleed Hemodynamically stable. FOBT positive. CT A/P with contrast 8/22: Resolution of previously seen free air.  No findings to suggest perforation at this time.  Persistent ulceration within the gastric wall related to prior perforated ulcer with repair. GI consulted.  Discussed with Dr. Eartha 8/23 who recommends no endoscopy at this time,  advance to soft diet, twice daily IV PPI, sucralfate . Lipase minimally elevated at 82, unclear significance but would not change supportive care management. No diarrhea.  Discontinued contact isolation and stool testing.  Tolerating some diet, up to 30% documented last evening.  Advised patient to minimize use of IV pain meds and antiemetics and try out p.o. meds.  Also advised her that she probably is going to have ongoing abdominal pain/discomfort, nausea for several days even a couple of weeks until adequate healing of her GI tract from recent surgery, alcohol use disorder etc.  She verbalized understanding.   Ascites  - S/p paracentesis yielding 800 mL fluid, analysis no signs of SBP   chronic GI blood loss anemia Hemoglobin on admission 10.1. >>>  8.5 Fluctuating overall stable around 8    Latest Ref Rng & Units 09/04/2023    4:30 AM 09/03/2023    5:28 AM 09/02/2023    4:48 AM  CBC  WBC 4.0 - 10.5 K/uL 3.4  4.1  4.2   Hemoglobin 12.0 - 15.0 g/dL 8.5  8.7  8.8   Hematocrit 36.0 - 46.0 % 27.1  27.2  26.6   Platelets 150 - 400 K/uL 107  105  121         Severe hypokalemia  Continue to replace and follow.   Hypomagnesemia Low normal.  Continue to replace and follow.   Elevated lactate Secondary to cirrhosis and hypoperfusion from GI losses IV fluids and resolved.   Hyponatremia, chronic Secondary to alcohol/beer use, cirrhosis, intravascular volume depletion from GI losses and poor oral intake Presented with serum sodium of 125. Improved and stable with serum sodiums fluctuating in the high 120s-130.   Alcohol use disorder Blood alcohol level 139 on admission. Absolute abstinence counseled CIWA protocol, scores 0.  No overt withdrawal.   Tobacco use disorder Absolute cessation counseled Patient declines nicotine  patch   Thrombocytopenia Chronic and intermittent secondary to cirrhosis and alcohol use.  Stable.   Cirrhosis No clinical ascites.  INR 1.3. Liver biopsy  from 08/05/2023: Pathology shows mild to moderately active steatohepatitis with cirrhosis.   S/p exploratory laparotomy 7/27 Still had staples in place which were removed on 8/23.  Outpatient follow-up with general surgery.   Body mass index is 23.46 kg/m.           -------------------------------------------------------------------------------------------------------- Nutritional status:  The patient's BMI is: Body mass index is 23.46 kg/m. I agree with the assessment and plan as outlined   -----------------------------------------------------------------------------------------------------------  DVT prophylaxis:  SCDs Start: 09/01/23 0114   Code Status:   Code Status: Full Code  Family Communication: No family member present at bedside-  -Advance care planning has been discussed.   Admission status:   Status is: Inpatient Remains inpatient appropriate because: Remaining supportive care, as needed IV analgesics, IV fluids   Disposition: From  - home             Planning for discharge in 1-2 days   Procedures:   No admission procedures for hospital encounter.   Antimicrobials:  Anti-infectives (From admission, onward)    None        Medication:   melatonin  6 mg Oral QHS   pantoprazole  (PROTONIX ) IV  40 mg Intravenous Q12H   sucralfate   1 g Oral TID WC & HS    alum & mag hydroxide-simeth, morphine  injection, ondansetron  **OR** ondansetron  (ZOFRAN ) IV, mouth rinse, oxyCODONE    Objective:   Vitals:   09/03/23 1449 09/03/23 1458 09/03/23 1955 09/04/23 0354  BP: 105/72 112/69 99/65 97/68   Pulse:  93 90 84  Resp:  17 16 16   Temp:  98.4 F (36.9 C) 99.2 F (37.3 C) 98.2 F (36.8 C)  TempSrc:  Oral Oral Oral  SpO2:  100% 99% 95%  Weight:      Height:       No intake or output data in the 24 hours ending 09/04/23 0727 Filed Weights   08/31/23 2144 09/01/23 0240  Weight: 55.3 kg 58.2 kg     Physical examination:   General:  AAO x 3,   cooperative, no distress;   HEENT:  Normocephalic, PERRL, otherwise with in Normal limits   Neuro:  CNII-XII intact. , normal motor and sensation, reflexes intact   Lungs:   Clear to auscultation BL, Respirations unlabored,  No wheezes / crackles  Cardio:    S1/S2, RRR, No murmure, No Rubs or Gallops   Abdomen:  Soft, mild diffuse subjective tenderness (distention, Rebound tenderness , bowel sounds active all four quadrants, no guarding or peritoneal signs.  Muscular  skeletal:  Limited exam -global generalized weaknesses - in bed, able to move all 4 extremities,   2+ pulses,  symmetric, No pitting edema  Skin:  Dry, warm to touch, negative for any Rashes,  Wounds:  Surgical abdominal wound-clean, staples removed,     -------------------------------------------------------------------------------------------------------------    LABs:     Latest Ref Rng & Units 09/04/2023    4:30 AM 09/03/2023    5:28 AM 09/02/2023    4:48 AM  CBC  WBC 4.0 - 10.5 K/uL 3.4  4.1  4.2   Hemoglobin 12.0 - 15.0 g/dL 8.5  8.7  8.8   Hematocrit 36.0 - 46.0 % 27.1  27.2  26.6   Platelets 150 - 400 K/uL 107  105  121       Latest Ref Rng & Units 09/04/2023    4:30 AM 09/03/2023    5:28 AM 09/02/2023    4:48 AM  CMP  Glucose 70 - 99 mg/dL 881  95  89   BUN 6 - 20 mg/dL <5  <5  <5   Creatinine 0.44 - 1.00 mg/dL 9.59  9.56  9.52   Sodium 135 - 145 mmol/L 130  129  129   Potassium 3.5 - 5.1 mmol/L 3.8  3.3  3.8   Chloride 98 - 111 mmol/L 102  100  100   CO2 22 - 32 mmol/L 24  25  24    Calcium  8.9 - 10.3 mg/dL 7.7  7.7  7.6   Total Protein 6.5 - 8.1 g/dL 5.4  5.7  5.7   Total Bilirubin 0.0 - 1.2 mg/dL 1.0  1.0  1.1   Alkaline Phos 38 - 126 U/L 53  60  56   AST 15 - 41 U/L 52  49  41   ALT 0 - 44 U/L 19  17  16         Micro Results Recent Results (from the past 240 hours)  Culture, body fluid w Gram Stain-bottle     Status: None (Preliminary result)   Collection Time: 09/03/23  2:30 PM    Specimen: Peritoneal Washings  Result Value Ref Range Status   Specimen Description PERITONEAL BOTTLES DRAWN AEROBIC AND ANAEROBIC  Final   Special Requests   Final    Blood Culture adequate volume Performed at [facility], [address], [place], [name] [address]    Culture PENDING  Incomplete   Report Status PENDING  Incomplete  Gram stain     Status: None   Collection Time: 09/03/23  2:30 PM   Specimen: Peritoneal Washings  Result Value Ref Range Status   Specimen Description PERITONEAL  Final   Special Requests PERITONEAL  Final   Gram Stain   Final    PLEURAL NO ORGANISMS SEEN WBC PRESENT,BOTH PMN AND MONONUCLEAR CYTOSPIN SMEAR Performed at [facility], [address], [place], [name] [address]    Report Status 09/03/2023 FINAL  Final    Radiology Reports US  Paracentesis Result Date: 09/03/2023 INDICATION: 53 year old female. History of gastric ulcer perforation status post gastrorrhaphy and ex lap. Found to have ascites. Request for therapeutic and diagnostic paracentesis EXAM: ULTRASOUND GUIDED THERAPEUTIC AND DIAGNOSTIC PARACENTESIS MEDICATIONS: Lidocaine  1% 10 mL COMPLICATIONS: None immediate. PROCEDURE: Informed written consent was obtained from the patient after a discussion of the risks, benefits and alternatives to treatment. A timeout was performed prior to the initiation of the procedure. Initial ultrasound scanning demonstrates a small amount of ascites within the right lower abdominal quadrant. The right lower abdomen was prepped and draped in the usual sterile fashion. 1% lidocaine  was used for local anesthesia. Following this, a 6 Fr Safe-T-Centesis catheter was introduced. An ultrasound image was saved for documentation purposes. The paracentesis was performed.  The catheter was removed and a dressing was applied. The patient tolerated the procedure well without immediate post procedural complication. FINDINGS: A total of approximately 800 mL of serosanguineous fluid  was removed. Samples were sent to the laboratory as requested by the clinical team. IMPRESSION: Successful ultrasound-guided therapeutic and diagnostic right-sided paracentesis yielding 800 mL of serosanguineous peritoneal fluid. Performed by Delon Beagle NP Electronically Signed   By: Wilkie Lent M.D.   On: 09/03/2023 14:51    SIGNED: Adriana DELENA Grams, MD, FHM. FAAFP. Joan [name] - [facility] Time spent - 55 min.  In seeing, evaluating and examining the patient. Reviewing medical records, labs, drawn plan of care. [facility],  Pager (please use [email or web address] to page/ text) Please use Epic Secure Chat for non-urgent communication (7AM-7PM)  If 7PM-7AM, please contact night-coverage [email or web address], 09/04/2023, 7:27 AM

## 2023-09-04 NOTE — Hospital Course (Addendum)
 Joan [name] is a 53 year old female, lives with her roommate, ambulates with the help of a cane, medical history significant for s/p exploratory laparotomy for perforated gastric ulcer repair with omental patch on 7/27, ongoing alcohol use disorder, tobacco abuse, cirrhosis, HTN, GERD, anxiety/depression, multiple hospitalizations (since her sixth hospital admission this year), presented to the ED with complaints of couple days of pain across upper abdomen, severe, associated with multiple episodes of nonbloody emesis and few episodes of black tarry stools in the context of ongoing alcohol use, drank about half a can of a 12 ounce beer on the morning of admission.  Denies NSAID use.   Admitted for nausea, vomiting, abdominal pain, possible upper GI bleed, multiple electrolyte abnormalities.  Clinically improving.      Assessment & Plan:   Principal Problem:   GI bleed Active Problems:   Hypokalemia   Diarrhea   Alcohol use disorder   Nausea and vomiting   Hyponatremia   Hypomagnesemia   Abdominal pain   Lactic acidosis   Elevated lipase   Liver cirrhosis (HCC)  Nausea, vomiting, abdominal pain, possible acute upper GI bleed Hemodynamically stable. FOBT positive. CT A/P with contrast 8/22: Resolution of previously seen free air.  No findings to suggest perforation at this time.  Persistent ulceration within the gastric wall related to prior perforated ulcer with repair. GI consulted.  Discussed with Dr. Eartha 8/23 who recommends no endoscopy at this time, advance to soft diet, twice daily IV PPI, sucralfate . Lipase minimally elevated at 82, unclear significance but would not change supportive care management. No diarrhea.  Discontinued contact isolation and stool testing.  Tolerating some diet, up to 30% documented last evening.  Advised patient to minimize use of IV pain meds and antiemetics and try out p.o. meds.  Also advised her that she probably is going to have ongoing  abdominal pain/discomfort, nausea for several days even a couple of weeks until adequate healing of her GI tract from recent surgery, alcohol use disorder etc.  She verbalized understanding.   Ascites  - S/p paracentesis yielding 800 mL fluid, analysis no signs of SBP   chronic GI blood loss anemia Hemoglobin on admission 10.1. >>>  8.5 Fluctuating overall stable around 8    Latest Ref Rng & Units 09/04/2023    4:30 AM 09/03/2023    5:28 AM 09/02/2023    4:48 AM  CBC  WBC 4.0 - 10.5 K/uL 3.4  4.1  4.2   Hemoglobin 12.0 - 15.0 g/dL 8.5  8.7  8.8   Hematocrit 36.0 - 46.0 % 27.1  27.2  26.6   Platelets 150 - 400 K/uL 107  105  121         Severe hypokalemia Continue to replace and follow.   Hypomagnesemia Low normal.  Continue to replace and follow.   Elevated lactate Secondary to cirrhosis and hypoperfusion from GI losses IV fluids and resolved.   Hyponatremia, chronic Secondary to alcohol/beer use, cirrhosis, intravascular volume depletion from GI losses and poor oral intake Presented with serum sodium of 125. Improved and stable with serum sodiums fluctuating in the high 120s-130.   Alcohol use disorder Blood alcohol level 139 on admission. Absolute abstinence counseled CIWA protocol, scores 0.  No overt withdrawal.   Tobacco use disorder Absolute cessation counseled Patient declines nicotine  patch   Thrombocytopenia Chronic and intermittent secondary to cirrhosis and alcohol use.  Stable.   Cirrhosis No clinical ascites.  INR 1.3. Liver biopsy from 08/05/2023: Pathology shows mild  to moderately active steatohepatitis with cirrhosis.   S/p exploratory laparotomy 7/27 Still had staples in place which were removed on 8/23.  Outpatient follow-up with general surgery.   Body mass index is 23.46 kg/m.

## 2023-09-04 NOTE — Telephone Encounter (Signed)
 Joan [name], I see you have been trying to reach patient to arrange follow-up after last hospitalization. She has since been hospitalized again, discharged today. Please try reaching her again to arrange 2 week follow-up. If you are not able to reach her after 2-3 tries on different days, please mail her a letter.

## 2023-09-05 ENCOUNTER — Telehealth: Payer: Self-pay

## 2023-09-05 ENCOUNTER — Encounter: Payer: Self-pay | Admitting: Family Medicine

## 2023-09-05 LAB — CYTOLOGY - NON PAP

## 2023-09-05 NOTE — Transitions of Care (Post Inpatient/ED Visit) (Signed)
   09/05/2023  Name: Joan Wood MRN: [id number] DOB: [birth year]  Today's TOC FU Call Status: Today's TOC FU Call Status:: Unsuccessful Call (1st Attempt) Unsuccessful Call (1st Attempt) Date: 09/05/23  Attempted to reach the patient regarding the most recent Inpatient/ED visit.  Follow Up Plan: Additional outreach attempts will be made to reach the patient to complete the Transitions of Care (Post Inpatient/ED visit) call.   Signature Julian Lemmings, LPN [facility] Nurse Health Advisor Direct Dial [phone]

## 2023-09-05 NOTE — Transitions of Care (Post Inpatient/ED Visit) (Signed)
 09/05/2023  Name: Joan Wood MRN: [id number] DOB: 05-22-1970  Today's TOC FU Call Status: Today's TOC FU Call Status:: Successful TOC FU Call Completed Unsuccessful Call (1st Attempt) Date: 09/05/23 [facility] FU Call Complete Date: 09/05/23 Patient's Name and Date of Birth confirmed.  Transition Care Management Follow-up Telephone Call Date of Discharge: 09/04/23 [facility] Type of Discharge: Inpatient Admission Primary Inpatient Discharge Diagnosis:: GI bleed How have you been since you were released from the hospital?: Better Any questions or concerns?: No  Items Reviewed: Did you receive and understand the discharge instructions provided?: Yes Medications obtained,verified, and reconciled?: Yes (Medications Reviewed) Any new allergies since your discharge?: No Dietary orders reviewed?: Yes Do you have support at home?: No  Medications Reviewed Today: Medications Reviewed Today     Reviewed by Emmitt Pan, LPN (Licensed Practical Nurse) on 09/05/23 at 1422  Med List Status: <None>   Medication Order Taking? Sig Documenting Provider Last Dose Status Informant  bismuth subsalicylate (PEPTO BISMOL) 262 MG/15ML suspension [id number] Yes Take 30 mLs by mouth every 6 (six) hours as needed for diarrhea or loose stools. [provider]  Active Self, Pharmacy Records  docusate sodium  (COLACE) 100 MG capsule [id number] Yes Take 1 capsule (100 mg total) by mouth daily as needed for mild constipation. Willette Adriana LABOR, MD  Active   Elastic Bandages & Supports (FITRITE BACK BRACE WITH PULLEY) MISC [id number] Yes 1 Units by Does not apply route daily. Bevely Doffing, FNP  Active Self, Pharmacy Records  hydrocortisone  (ANUSOL -HC) 25 MG suppository [id number] Yes Place 1 suppository (25 mg total) rectally 2 (two) times daily. Pearlean Manus, MD  Active Self, Pharmacy Records  omeprazole  [facility]) 40 MG capsule [id number]  Take 1 capsule (40 mg total) by  mouth 2 (two) times daily.  Patient not taking: Reported on 09/05/2023   Pearlean Manus, MD  Active Self, Pharmacy Records           Med Note DRENA CHUCK MATSU   Dju Sep 01, 2023  1:33 PM) Rx is new from [facility] provider, pt states not taking   ondansetron  (ZOFRAN -ODT) 4 MG disintegrating tablet [id number] Yes Take 1 tablet (4 mg total) by mouth every 8 (eight) hours as needed for nausea or vomiting. Pearlean Manus, MD  Active Self, Pharmacy Records  oxyCODONE  (OXY IR/ROXICODONE ) 5 MG immediate release tablet [id number] Yes Take 1 tablet (5 mg total) by mouth every 6 (six) hours as needed for severe pain (pain score 7-10) or breakthrough pain. Kallie Manuelita BROCKS, MD  Active Self, Pharmacy Records  sertraline  (ZOLOFT ) 50 MG tablet [id number] Yes Take 1 tablet (50 mg total) by mouth daily. Pearlean Manus, MD  Active Self, Pharmacy Records           Med Note DRENA CHUCK MATSU   Sat Sep 01, 2023  1:33 PM) Pt has not started this medication yet  sucralfate  (CARAFATE ) 1 g tablet [id number] Yes Take 1 tablet (1 g total) by mouth 4 (four) times daily -  with meals and at bedtime for 10 days. Willette Adriana LABOR, MD  Active   Vitamin D , Ergocalciferol , (DRISDOL ) 1.25 MG (50000 UNIT) CAPS capsule [id number]  Take 1 capsule (50,000 Units total) by mouth every 7 (seven) days.  Patient not taking: Reported on 09/05/2023   Pearlean Manus, MD  Active Self, Pharmacy Records           Med Note DRENA CHUCK MATSU   Dju Sep 01, 2023  1:33 PM) Rx  is new from [facility] provider, pt states not taking             Home Care and Equipment/Supplies: Were Home Health Services Ordered?: NA Any new equipment or medical supplies ordered?: NA  Functional Questionnaire: Do you need assistance with bathing/showering or dressing?: No Do you need assistance with meal preparation?: No Do you need assistance with eating?: No Do you have difficulty maintaining continence: No Do you need assistance with getting out  of bed/getting out of a chair/moving?: No Do you have difficulty managing or taking your medications?: No  Follow up appointments reviewed: PCP Follow-up appointment confirmed?: Yes Date of PCP follow-up appointment?: 09/17/23 Follow-up Provider: [facility] Follow-up appointment confirmed?: NA Do you need transportation to your follow-up appointment?: No Do you understand care options if your condition(s) worsen?: Yes-patient verbalized understanding    SIGNATURE Julian Lemmings, LPN [facility] Nurse Health Advisor Direct Dial [phone]

## 2023-09-08 LAB — CULTURE, BODY FLUID W GRAM STAIN -BOTTLE
Culture: NO GROWTH
Special Requests: ADEQUATE

## 2023-09-13 ENCOUNTER — Emergency Department (HOSPITAL_COMMUNITY)
Admission: EM | Admit: 2023-09-13 | Discharge: 2023-09-14 | Disposition: A | Discharge status: Home / Self Care | Attending: Emergency Medicine | Admitting: Emergency Medicine

## 2023-09-13 ENCOUNTER — Emergency Department (HOSPITAL_COMMUNITY)

## 2023-09-13 ENCOUNTER — Encounter (HOSPITAL_COMMUNITY): Payer: Self-pay | Admitting: Emergency Medicine

## 2023-09-13 DIAGNOSIS — R1013 Epigastric pain: Secondary | ICD-10-CM | POA: Diagnosis not present

## 2023-09-13 DIAGNOSIS — K259 Gastric ulcer, unspecified as acute or chronic, without hemorrhage or perforation: Secondary | ICD-10-CM | POA: Diagnosis not present

## 2023-09-13 DIAGNOSIS — Z91040 Latex allergy status: Secondary | ICD-10-CM | POA: Insufficient documentation

## 2023-09-13 DIAGNOSIS — K7460 Unspecified cirrhosis of liver: Secondary | ICD-10-CM | POA: Diagnosis not present

## 2023-09-13 DIAGNOSIS — R188 Other ascites: Secondary | ICD-10-CM | POA: Diagnosis not present

## 2023-09-13 DIAGNOSIS — R1033 Periumbilical pain: Secondary | ICD-10-CM | POA: Insufficient documentation

## 2023-09-13 DIAGNOSIS — K828 Other specified diseases of gallbladder: Secondary | ICD-10-CM | POA: Diagnosis not present

## 2023-09-13 DIAGNOSIS — R1010 Upper abdominal pain, unspecified: Secondary | ICD-10-CM | POA: Diagnosis present

## 2023-09-13 LAB — CBC
HCT: 34.7 % — ABNORMAL LOW (ref 36.0–46.0)
Hemoglobin: 11.4 g/dL — ABNORMAL LOW (ref 12.0–15.0)
MCH: 29.8 pg (ref 26.0–34.0)
MCHC: 32.9 g/dL (ref 30.0–36.0)
MCV: 90.6 fL (ref 80.0–100.0)
Platelets: 125 K/uL — ABNORMAL LOW (ref 150–400)
RBC: 3.83 MIL/uL — ABNORMAL LOW (ref 3.87–5.11)
RDW: 20.9 % — ABNORMAL HIGH (ref 11.5–15.5)
WBC: 4.5 K/uL (ref 4.0–10.5)
nRBC: 0 % (ref 0.0–0.2)

## 2023-09-13 LAB — COMPREHENSIVE METABOLIC PANEL WITH GFR
ALT: 22 U/L (ref 0–44)
AST: 85 U/L — ABNORMAL HIGH (ref 15–41)
Albumin: 2.3 g/dL — ABNORMAL LOW (ref 3.5–5.0)
Alkaline Phosphatase: 85 U/L (ref 38–126)
Anion gap: 15 (ref 5–15)
BUN: <5 mg/dL — ABNORMAL LOW (ref 6–20)
CO2: 23 mmol/L (ref 22–32)
Calcium: 8.1 mg/dL — ABNORMAL LOW (ref 8.9–10.3)
Chloride: 98 mmol/L (ref 98–111)
Creatinine, Ser: 0.43 mg/dL — ABNORMAL LOW (ref 0.44–1.00)
GFR, Estimated: >60 mL/min (ref >60)
Glucose, Bld: 84 mg/dL (ref 70–99)
Potassium: 3.8 mmol/L (ref 3.5–5.1)
Sodium: 136 mmol/L (ref 135–145)
Total Bilirubin: 1.2 mg/dL (ref 0.0–1.2)
Total Protein: 7.5 g/dL (ref 6.5–8.1)

## 2023-09-13 LAB — ACID FAST SMEAR (AFB, MYCOBACTERIA): Acid Fast Smear: NEGATIVE

## 2023-09-13 LAB — LIPASE, BLOOD: Lipase: 32 U/L (ref 11–51)

## 2023-09-13 MED ORDER — MORPHINE SULFATE (PF) 4 MG/ML IV SOLN
4.0000 mg | Freq: Once | INTRAVENOUS | Status: AC
  Administered 2023-09-13: 4 mg via INTRAVENOUS
  Filled 2023-09-13: qty 1

## 2023-09-13 MED ORDER — IOHEXOL 300 MG/ML  SOLN
100.0000 mL | Freq: Once | INTRAMUSCULAR | Status: AC | PRN
  Administered 2023-09-13: 100 mL via INTRAVENOUS

## 2023-09-13 MED ORDER — ALUM & MAG HYDROXIDE-SIMETH 200-200-20 MG/5ML PO SUSP
30.0000 mL | Freq: Once | ORAL | Status: AC
  Administered 2023-09-14: 30 mL via ORAL
  Filled 2023-09-13: qty 30

## 2023-09-13 MED ORDER — LIDOCAINE VISCOUS HCL 2 % MT SOLN
15.0000 mL | Freq: Once | OROMUCOSAL | Status: AC
  Administered 2023-09-14: 15 mL via ORAL
  Filled 2023-09-13: qty 15

## 2023-09-13 MED ORDER — SODIUM CHLORIDE 0.9 % IV BOLUS
1000.0000 mL | Freq: Once | INTRAVENOUS | Status: AC
  Administered 2023-09-14: 1000 mL via INTRAVENOUS

## 2023-09-13 MED ORDER — ONDANSETRON HCL 4 MG/2ML IJ SOLN
4.0000 mg | Freq: Once | INTRAMUSCULAR | Status: AC
  Administered 2023-09-13: 4 mg via INTRAVENOUS
  Filled 2023-09-13: qty 2

## 2023-09-13 NOTE — Progress Notes (Signed)
 Ok to switch coding to [phone]  Thanks

## 2023-09-13 NOTE — ED Provider Notes (Signed)
 [place] EMERGENCY DEPARTMENT AT [facility] Provider Note   CSN: [id number] Arrival date & time: 09/13/23  2156     Patient presents with: Abdominal Pain   Joan Wood is a 53 y.o. female.   Patient is a 39 past medical history recent surgery for perforated gastric ulcer.  Patient had a JP drain in place until several weeks ago.  Patient presents here with complaints of upper abdominal pain.  Pain is located superior to the surgical incision and is worse with movement and palpation.  She describes nausea and vomiting with diarrhea and loose stools.  She denies any bloody stool or vomit.  No fevers or chills.       Prior to Admission medications   Medication Sig Start Date End Date Taking? Authorizing Provider  bismuth subsalicylate (PEPTO BISMOL) 262 MG/15ML suspension Take 30 mLs by mouth every 6 (six) hours as needed for diarrhea or loose stools.    [provider]  docusate sodium  (COLACE) 100 MG capsule Take 1 capsule (100 mg total) by mouth daily as needed for mild constipation. 09/04/23 09/03/24  Willette Adriana LABOR, MD  Elastic Bandages & Supports (FITRITE BACK BRACE WITH PULLEY) MISC 1 Units by Does not apply route daily. 05/25/23   Bevely Doffing, FNP  hydrocortisone  (ANUSOL -HC) 25 MG suppository Place 1 suppository (25 mg total) rectally 2 (two) times daily. 08/22/23   Pearlean Manus, MD  omeprazole  (PRILOSEC) 40 MG capsule Take 1 capsule (40 mg total) by mouth 2 (two) times daily. Patient not taking: Reported on 09/05/2023 08/22/23   Pearlean Manus, MD  ondansetron  (ZOFRAN -ODT) 4 MG disintegrating tablet Take 1 tablet (4 mg total) by mouth every 8 (eight) hours as needed for nausea or vomiting. 08/22/23   Pearlean Manus, MD  oxyCODONE  (OXY IR/ROXICODONE ) 5 MG immediate release tablet Take 1 tablet (5 mg total) by mouth every 6 (six) hours as needed for severe pain (pain score 7-10) or breakthrough pain. 08/22/23   Kallie Manuelita BROCKS, MD  sertraline   (ZOLOFT ) 50 MG tablet Take 1 tablet (50 mg total) by mouth daily. 08/22/23   Pearlean Manus, MD  sucralfate  (CARAFATE ) 1 g tablet Take 1 tablet (1 g total) by mouth 4 (four) times daily -  with meals and at bedtime for 10 days. 09/04/23 09/14/23  ShahmehdiAdriana LABOR, MD  Vitamin D , Ergocalciferol , (DRISDOL ) 1.25 MG (50000 UNIT) CAPS capsule Take 1 capsule (50,000 Units total) by mouth every 7 (seven) days. Patient not taking: Reported on 09/05/2023 08/22/23   Pearlean Manus, MD    Allergies: Bee venom, Fire ant, and Latex    Review of Systems  All other systems reviewed and are negative.   Updated Vital Signs BP (!) 115/94 (BP Location: Right Arm)   Pulse (!) 101   Temp 98.4 F (36.9 C) (Oral)   Resp 18   Ht 5' 2 (1.575 m)   Wt 54.4 kg   SpO2 100%   BMI 21.95 kg/m   Physical Exam Vitals and nursing note reviewed.  Constitutional:      General: She is not in acute distress.    Appearance: She is well-developed. She is not diaphoretic.  HENT:     Head: Normocephalic and atraumatic.  Cardiovascular:     Rate and Rhythm: Normal rate and regular rhythm.     Heart sounds: No murmur heard.    No friction rub. No gallop.  Pulmonary:     Effort: Pulmonary effort is normal. No respiratory distress.  Breath sounds: Normal breath sounds. No wheezing.  Abdominal:     General: Bowel sounds are normal. There is no distension.     Palpations: Abdomen is soft.     Tenderness: There is abdominal tenderness in the epigastric area and periumbilical area. There is no right CVA tenderness, left CVA tenderness, guarding or rebound.     Comments: There is a midline surgical scar noted.  There is tenderness to palpation to the superior aspect of the abdomen adjacent to the surgical scar.  Musculoskeletal:        General: Normal range of motion.     Cervical back: Normal range of motion and neck supple.  Skin:    General: Skin is warm and dry.  Neurological:     General: No focal deficit  present.     Mental Status: She is alert and oriented to person, place, and time.     (all labs ordered are listed, but only abnormal results are displayed) Labs Reviewed  COMPREHENSIVE METABOLIC PANEL WITH GFR - Abnormal; Notable for the following components:      Result Value   BUN <5 (*)    Creatinine, Ser 0.43 (*)    Calcium  8.1 (*)    Albumin 2.3 (*)    AST 85 (*)    All other components within normal limits  CBC - Abnormal; Notable for the following components:   RBC 3.83 (*)    Hemoglobin 11.4 (*)    HCT 34.7 (*)    RDW 20.9 (*)    Platelets 125 (*)    All other components within normal limits  LIPASE, BLOOD  URINALYSIS, ROUTINE W REFLEX MICROSCOPIC    EKG: None  Radiology: No results found.   Procedures   Medications Ordered in the ED  alum & mag hydroxide-simeth (MAALOX/MYLANTA) 200-200-20 MG/5ML suspension 30 mL (has no administration in time range)    And  lidocaine  (XYLOCAINE ) 2 % viscous mouth solution 15 mL (has no administration in time range)  sodium chloride  0.9 % bolus 1,000 mL (has no administration in time range)  ondansetron  (ZOFRAN ) injection 4 mg (has no administration in time range)  morphine  (PF) 4 MG/ML injection 4 mg (has no administration in time range)                                    Medical Decision Making Amount and/or Complexity of Data Reviewed Labs: ordered. Radiology: ordered.  Risk OTC drugs. Prescription drug management.   Patient is a 53 year old female presenting with complaints of upper abdominal pain as described in the HPI.  Patient arrives in stable vital signs and is afebrile.  Physical examination reveals epigastric tenderness, but no peritoneal signs.  Laboratory studies obtained including CBC, CMP, and lipase, all of which are basically unremarkable.  There is no leukocytosis.  She does have a slight elevation of her AST, but remaining liver functions are normal.  CT scan of the abdomen and pelvis obtained  showing irregular thickening of the gastric antrum, likely postsurgical in nature.  There are also thickened folds in the ascending and transverse colon which could be due to colitis or portal colopathy.  Patient has received IV fluids along with morphine  for pain and Zofran  for nausea and seems to be feeling better.  With nothing emergent showing up on her CT scan, I feel as though patient can be discharged and follow-up with her surgeon if  she continues to experience ongoing problems.     Final diagnoses:  None    ED Discharge Orders     None          Geroldine Berg, MD 09/15/23 [phone]

## 2023-09-13 NOTE — ED Notes (Signed)
 Patient transported to CT

## 2023-09-13 NOTE — ED Triage Notes (Signed)
 Pt arrives via EMS from home with c/o abd pain, diarrhea, and blood tinged vomit for 2-3 days. Pt states unable to keep food down for a week. Hx cirrhosis and abd sx a couple weeks ago.

## 2023-09-14 DIAGNOSIS — K259 Gastric ulcer, unspecified as acute or chronic, without hemorrhage or perforation: Secondary | ICD-10-CM | POA: Diagnosis not present

## 2023-09-14 DIAGNOSIS — K828 Other specified diseases of gallbladder: Secondary | ICD-10-CM | POA: Diagnosis not present

## 2023-09-14 DIAGNOSIS — K7460 Unspecified cirrhosis of liver: Secondary | ICD-10-CM | POA: Diagnosis not present

## 2023-09-14 DIAGNOSIS — R188 Other ascites: Secondary | ICD-10-CM | POA: Diagnosis not present

## 2023-09-14 LAB — URINALYSIS, ROUTINE W REFLEX MICROSCOPIC
Bilirubin Urine: NEGATIVE
Glucose, UA: NEGATIVE mg/dL
Hgb urine dipstick: NEGATIVE
Ketones, ur: NEGATIVE mg/dL
Leukocytes,Ua: NEGATIVE
Nitrite: NEGATIVE
Protein, ur: NEGATIVE mg/dL
Specific Gravity, Urine: 1.01 (ref 1.005–1.030)
pH: 6 (ref 5.0–8.0)

## 2023-09-14 MED ORDER — OXYCODONE-ACETAMINOPHEN 5-325 MG PO TABS: 1.0000 | ORAL_TABLET | Freq: Four times a day (QID) | ORAL | 0 refills | Status: DC | PRN

## 2023-09-14 NOTE — ED Notes (Signed)
Pt ambulated to bathroom with stand by assist.  

## 2023-09-14 NOTE — Discharge Instructions (Addendum)
 Begin taking Percocet as prescribed as needed for pain.  Clear liquids for the next 12 hours, then slowly advance diet as tolerated.  Follow-up with your surgeon if symptoms persist.

## 2023-09-14 NOTE — ED Notes (Signed)
 Unable to get patient ride at this time. Will try again.

## 2023-09-17 ENCOUNTER — Encounter: Payer: Self-pay | Admitting: Nurse Practitioner

## 2023-09-17 ENCOUNTER — Emergency Department (HOSPITAL_COMMUNITY)
Admission: EM | Admit: 2023-09-17 | Discharge: 2023-09-17 | Disposition: A | Discharge status: Home / Self Care | Source: Ambulatory Visit | Attending: Emergency Medicine | Admitting: Emergency Medicine

## 2023-09-17 ENCOUNTER — Ambulatory Visit: Admitting: Nurse Practitioner

## 2023-09-17 ENCOUNTER — Other Ambulatory Visit: Payer: Self-pay

## 2023-09-17 ENCOUNTER — Encounter (HOSPITAL_COMMUNITY): Payer: Self-pay

## 2023-09-17 ENCOUNTER — Emergency Department (HOSPITAL_COMMUNITY)

## 2023-09-17 VITALS — BP 107/73 | HR 125 | Ht 62.0 in | Wt 114.0 lb

## 2023-09-17 DIAGNOSIS — Z91040 Latex allergy status: Secondary | ICD-10-CM | POA: Diagnosis not present

## 2023-09-17 DIAGNOSIS — R188 Other ascites: Secondary | ICD-10-CM | POA: Diagnosis not present

## 2023-09-17 DIAGNOSIS — K7460 Unspecified cirrhosis of liver: Secondary | ICD-10-CM | POA: Diagnosis not present

## 2023-09-17 DIAGNOSIS — R112 Nausea with vomiting, unspecified: Secondary | ICD-10-CM

## 2023-09-17 DIAGNOSIS — I10 Essential (primary) hypertension: Secondary | ICD-10-CM | POA: Diagnosis not present

## 2023-09-17 DIAGNOSIS — D509 Iron deficiency anemia, unspecified: Secondary | ICD-10-CM

## 2023-09-17 DIAGNOSIS — R109 Unspecified abdominal pain: Secondary | ICD-10-CM | POA: Diagnosis not present

## 2023-09-17 DIAGNOSIS — R5383 Other fatigue: Secondary | ICD-10-CM

## 2023-09-17 DIAGNOSIS — E876 Hypokalemia: Secondary | ICD-10-CM | POA: Insufficient documentation

## 2023-09-17 DIAGNOSIS — R000 Tachycardia, unspecified: Secondary | ICD-10-CM | POA: Diagnosis not present

## 2023-09-17 LAB — LIPASE, BLOOD: Lipase: 29 U/L (ref 11–51)

## 2023-09-17 LAB — CBC WITH DIFFERENTIAL/PLATELET
Abs Immature Granulocytes: 0.02 K/uL (ref 0.00–0.07)
Basophils Absolute: 0 K/uL (ref 0.0–0.1)
Basophils Relative: 0 %
Eosinophils Absolute: 0 K/uL (ref 0.0–0.5)
Eosinophils Relative: 0 %
HCT: 30.2 % — ABNORMAL LOW (ref 36.0–46.0)
Hemoglobin: 10.1 g/dL — ABNORMAL LOW (ref 12.0–15.0)
Immature Granulocytes: 0 %
Lymphocytes Relative: 12 %
Lymphs Abs: 0.7 K/uL (ref 0.7–4.0)
MCH: 30 pg (ref 26.0–34.0)
MCHC: 33.4 g/dL (ref 30.0–36.0)
MCV: 89.6 fL (ref 80.0–100.0)
Monocytes Absolute: 0.5 K/uL (ref 0.1–1.0)
Monocytes Relative: 10 %
Neutro Abs: 4 K/uL (ref 1.7–7.7)
Neutrophils Relative %: 78 %
Platelets: 85 K/uL — ABNORMAL LOW (ref 150–400)
RBC: 3.37 MIL/uL — ABNORMAL LOW (ref 3.87–5.11)
RDW: 21.2 % — ABNORMAL HIGH (ref 11.5–15.5)
WBC: 5.2 K/uL (ref 4.0–10.5)
nRBC: 0 % (ref 0.0–0.2)

## 2023-09-17 LAB — URINALYSIS, ROUTINE W REFLEX MICROSCOPIC
Bacteria, UA: NONE SEEN
Bilirubin Urine: NEGATIVE
Glucose, UA: NEGATIVE mg/dL
Ketones, ur: NEGATIVE mg/dL
Leukocytes,Ua: NEGATIVE
Nitrite: NEGATIVE
Protein, ur: NEGATIVE mg/dL
Specific Gravity, Urine: 1.027 (ref 1.005–1.030)
pH: 7 (ref 5.0–8.0)

## 2023-09-17 LAB — CBG MONITORING, ED: Glucose-Capillary: 107 mg/dL — ABNORMAL HIGH (ref 70–99)

## 2023-09-17 LAB — COMPREHENSIVE METABOLIC PANEL WITH GFR
ALT: 23 U/L (ref 0–44)
AST: 84 U/L — ABNORMAL HIGH (ref 15–41)
Albumin: 2.2 g/dL — ABNORMAL LOW (ref 3.5–5.0)
Alkaline Phosphatase: 80 U/L (ref 38–126)
Anion gap: 14 (ref 5–15)
BUN: <5 mg/dL — ABNORMAL LOW (ref 6–20)
CO2: 26 mmol/L (ref 22–32)
Calcium: 8 mg/dL — ABNORMAL LOW (ref 8.9–10.3)
Chloride: 91 mmol/L — ABNORMAL LOW (ref 98–111)
Creatinine, Ser: 0.48 mg/dL (ref 0.44–1.00)
GFR, Estimated: >60 mL/min (ref >60)
Glucose, Bld: 98 mg/dL (ref 70–99)
Potassium: 2.7 mmol/L — CL (ref 3.5–5.1)
Sodium: 131 mmol/L — ABNORMAL LOW (ref 135–145)
Total Bilirubin: 2.3 mg/dL — ABNORMAL HIGH (ref 0.0–1.2)
Total Protein: 7.1 g/dL (ref 6.5–8.1)

## 2023-09-17 LAB — LACTIC ACID, PLASMA
Lactic Acid, Venous: 2.1 mmol/L (ref 0.5–1.9)
Lactic Acid, Venous: 1.5 mmol/L (ref 0.5–1.9)

## 2023-09-17 LAB — TROPONIN I (HIGH SENSITIVITY): Troponin I (High Sensitivity): 3 ng/L (ref <18)

## 2023-09-17 MED ORDER — ONDANSETRON HCL 4 MG/2ML IJ SOLN
4.0000 mg | Freq: Once | INTRAMUSCULAR | Status: AC
  Administered 2023-09-17: 4 mg via INTRAVENOUS
  Filled 2023-09-17: qty 2

## 2023-09-17 MED ORDER — MORPHINE SULFATE (PF) 4 MG/ML IV SOLN
4.0000 mg | Freq: Once | INTRAVENOUS | Status: AC
  Administered 2023-09-17: 4 mg via INTRAVENOUS
  Filled 2023-09-17: qty 1

## 2023-09-17 MED ORDER — LACTATED RINGERS IV BOLUS
1000.0000 mL | Freq: Once | INTRAVENOUS | Status: AC
  Administered 2023-09-17: 1000 mL via INTRAVENOUS

## 2023-09-17 MED ORDER — SIMETHICONE 40 MG/0.6ML PO SUSP
40.0000 mg | Freq: Once | ORAL | Status: AC
  Administered 2023-09-17: 40 mg via ORAL
  Filled 2023-09-17: qty 30

## 2023-09-17 MED ORDER — PANTOPRAZOLE SODIUM 40 MG IV SOLR
80.0000 mg | Freq: Once | INTRAVENOUS | Status: AC
  Administered 2023-09-17: 80 mg via INTRAVENOUS
  Filled 2023-09-17: qty 20

## 2023-09-17 MED ORDER — POTASSIUM CHLORIDE CRYS ER 20 MEQ PO TBCR: 40.0000 meq | EXTENDED_RELEASE_TABLET | Freq: Every day | ORAL | 0 refills | Status: AC

## 2023-09-17 MED ORDER — POTASSIUM CHLORIDE CRYS ER 20 MEQ PO TBCR
40.0000 meq | EXTENDED_RELEASE_TABLET | Freq: Once | ORAL | Status: AC
  Administered 2023-09-17: 40 meq via ORAL
  Filled 2023-09-17: qty 2

## 2023-09-17 MED ORDER — MAGNESIUM SULFATE 2 GM/50ML IV SOLN
2.0000 g | Freq: Once | INTRAVENOUS | Status: AC
  Administered 2023-09-17: 2 g via INTRAVENOUS
  Filled 2023-09-17: qty 50

## 2023-09-17 MED ORDER — IOHEXOL 300 MG/ML  SOLN
100.0000 mL | Freq: Once | INTRAMUSCULAR | Status: AC | PRN
  Administered 2023-09-17: 100 mL via INTRAVENOUS

## 2023-09-17 MED ORDER — POTASSIUM CHLORIDE 10 MEQ/100ML IV SOLN
10.0000 meq | INTRAVENOUS | Status: AC
Start: 2023-09-17 — End: 2023-09-17
  Administered 2023-09-17 (×2): 10 meq via INTRAVENOUS
  Filled 2023-09-17 (×2): qty 100

## 2023-09-17 MED ORDER — ONDANSETRON HCL 4 MG PO TABS: 4.0000 mg | ORAL_TABLET | Freq: Three times a day (TID) | ORAL | Status: AC | PRN

## 2023-09-17 MED ORDER — DIAZEPAM 5 MG PO TABS
5.0000 mg | ORAL_TABLET | Freq: Once | ORAL | Status: AC
  Administered 2023-09-17: 5 mg via ORAL
  Filled 2023-09-17: qty 1

## 2023-09-17 NOTE — ED Notes (Addendum)
 Pt passed PO challenge.

## 2023-09-17 NOTE — Progress Notes (Signed)
 Established Patient Office Visit  Subjective:  Patient ID: Joan Wood, female    DOB: 12-09-1970  Age: 53 y.o. MRN: [id number]  Chief Complaint  Patient presents with   Hospitalization Follow-up    Patient here today for a hospital follow up for GI bleed.  Yet she reports she is nauseated, continues to experience intermitent emesis that is yellow in color.  Reports fatigue and dizziness.  Patient's H/H was mildly low when discharged.  She states she cannot eat a cracker without vomiting.  She has a sick/emesis bag beside her today.    No other concerns at this time.   Past Medical History:  Diagnosis Date   Anxiety    Dysrhythmia    GERD (gastroesophageal reflux disease)    Hernia of abdominal wall    History of hiatal hernia    Sleep apnea    SVT (supraventricular tachycardia) (HCC)     Past Surgical History:  Procedure Laterality Date   ESOPHAGOGASTRODUODENOSCOPY N/A 05/17/2023   Procedure: EGD (ESOPHAGOGASTRODUODENOSCOPY);  Surgeon: Cindie Carlin POUR, DO;  Location: [facility];  Service: Endoscopy;  Laterality: N/A;   FINGER FRACTURE SURGERY     FRACTURE SURGERY     GASTRORRHAPHY N/A 08/05/2023   Procedure: GASTRORRHAPHY;  Surgeon: Kallie Manuelita BROCKS, MD;  Location: [facility];  Service: General;  Laterality: N/A;   LAPAROTOMY N/A 08/05/2023   Procedure: LAPAROTOMY, EXPLORATORY;  Surgeon: Kallie Manuelita BROCKS, MD;  Location: [facility];  Service: General;  Laterality: N/A;   LIVER BIOPSY N/A 08/05/2023   Procedure: BIOPSY, LIVER;  Surgeon: Kallie Manuelita BROCKS, MD;  Location: [facility];  Service: General;  Laterality: N/A;   right ankle repair     age 89   UMBILICAL HERNIA REPAIR N/A 08/05/2023   Procedure: REPAIR, HERNIA, UMBILICAL, ADULT;  Surgeon: Kallie Manuelita BROCKS, MD;  Location: [facility];  Service: General;  Laterality: N/A;    Social History   Socioeconomic History   Marital status: Single    Spouse name: Not on file   Number of children: 4   Years of education:  Not on file   Highest education level: Not on file  Occupational History   Not on file  Tobacco Use   Smoking status: Every Day    Current packs/day: 0.50    Average packs/day: 0.7 packs/day for 38.5 years (28.5 ttl pk-yrs)    Types: Cigarettes    Start date: 03/2022   Smokeless tobacco: Never  Vaping Use   Vaping status: Never Used  Substance and Sexual Activity   Alcohol use: Yes    Alcohol/week: 2.0 standard drinks of alcohol    Types: 2 Cans of beer per week    Comment: beer,2-3 beers in a weeks time   Drug use: Never   Sexual activity: Not Currently    Birth control/protection: Abstinence  Other Topics Concern   Not on file  Social History Narrative   Pt states she has had 4 children, however, one child died died of SIDS at age 24.5 months. Pt is divorced. Pt has a female roommate in  which she lives with. Pt states she is cutting down on drinking beer and now drinks 2-3 beers per week and had her last beer yesterday morning. Pt still smokes but is cutting down and only smokes 3 cigarettes per day now.   Social Drivers of Corporate investment banker Strain: Not on file  Food Insecurity: No Food Insecurity (09/01/2023)   Hunger Vital Sign  Worried About Programme researcher, broadcasting/film/video in the Last Year: Never true    Ran Out of Food in the Last Year: Never true  Transportation Needs: No Transportation Needs (09/01/2023)   PRAPARE - Administrator, Civil Service (Medical): No    Lack of Transportation (Non-Medical): No  Recent Concern: Transportation Needs - Unmet Transportation Needs (08/20/2023)   PRAPARE - Administrator, Civil Service (Medical): Yes    Lack of Transportation (Non-Medical): Yes  Physical Activity: Not on file  Stress: Not on file  Social Connections: Moderately Isolated (09/01/2023)   Social Connection and Isolation Panel    Frequency of Communication with Friends and Family: Three times a week    Frequency of Social Gatherings with Friends and  Family: Three times a week    Attends Religious Services: 1 to 4 times per year    Active Member of Clubs or Organizations: No    Attends Banker Meetings: Never    Marital Status: Divorced  Catering manager Violence: Not At Risk (09/01/2023)   Humiliation, Afraid, Rape, and Kick questionnaire    Fear of Current or Ex-Partner: No    Emotionally Abused: No    Physically Abused: No    Sexually Abused: No    Family History  Problem Relation Age of Onset   Cancer Mother    Leukemia Mother    Multiple myeloma Mother    Cancer Father        unsure what kind   Cancer - Colon Neg Hx    Colon polyps Neg Hx     Allergies  Allergen Reactions   Bee Venom Anaphylaxis    Cannot take epi for this allergy d/t heart condition per pt   Fire [employer] and Rash   Latex Rash    Outpatient Medications Prior to Visit  Medication Sig   bismuth subsalicylate (PEPTO BISMOL) 262 MG/15ML suspension Take 30 mLs by mouth every 6 (six) hours as needed for diarrhea or loose stools.   docusate sodium  (COLACE) 100 MG capsule Take 1 capsule (100 mg total) by mouth daily as needed for mild constipation.   Elastic Bandages & Supports (FITRITE BACK BRACE WITH PULLEY) MISC 1 Units by Does not apply route daily.   hydrocortisone  (ANUSOL -HC) 25 MG suppository Place 1 suppository (25 mg total) rectally 2 (two) times daily.   omeprazole  (PRILOSEC) 40 MG capsule Take 1 capsule (40 mg total) by mouth 2 (two) times daily. (Patient not taking: Reported on 09/05/2023)   ondansetron  (ZOFRAN -ODT) 4 MG disintegrating tablet Take 1 tablet (4 mg total) by mouth every 8 (eight) hours as needed for nausea or vomiting.   oxyCODONE  (OXY IR/ROXICODONE ) 5 MG immediate release tablet Take 1 tablet (5 mg total) by mouth every 6 (six) hours as needed for severe pain (pain score 7-10) or breakthrough pain.   oxyCODONE -acetaminophen  (PERCOCET/ROXICET) 5-325 MG tablet Take 1-2 tablets by mouth every 6 (six) hours as needed for  severe pain (pain score 7-10).   sertraline  (ZOLOFT ) 50 MG tablet Take 1 tablet (50 mg total) by mouth daily.   sucralfate  (CARAFATE ) 1 g tablet Take 1 tablet (1 g total) by mouth 4 (four) times daily -  with meals and at bedtime for 10 days.   Vitamin D , Ergocalciferol , (DRISDOL ) 1.25 MG (50000 UNIT) CAPS capsule Take 1 capsule (50,000 Units total) by mouth every 7 (seven) days. (Patient not taking: Reported on 09/05/2023)   No facility-administered medications prior to visit.  ROS     Objective:   BP 107/73   Pulse (!) 125   Ht 5' 2 (1.575 m)   Wt 114 lb (51.7 kg)   SpO2 94%   BMI 20.85 kg/m   Vitals:   09/17/23 0838  BP: 107/73  Pulse: (!) 125  Height: 5' 2 (1.575 m)  Weight: 114 lb (51.7 kg)  SpO2: 94%  BMI (Calculated): 20.85    Physical Exam Vitals and nursing note reviewed.  Constitutional:      Appearance: She is ill-appearing.  HENT:     Head: Normocephalic.  Cardiovascular:     Rate and Rhythm: Normal rate and regular rhythm.     Pulses: Normal pulses.     Heart sounds: Normal heart sounds.  Pulmonary:     Effort: Pulmonary effort is normal.     Breath sounds: Normal breath sounds.  Skin:    Coloration: Skin is pale.  Neurological:     Mental Status: She is alert and oriented to person, place, and time.  Psychiatric:        Mood and Affect: Mood normal.        Behavior: Behavior normal.      No results found for any visits on 09/17/23.  Recent Results (from the past 2160 hours)  Hemoglobin A1c     Status: Abnormal   Collection Time: 07/30/23  9:39 AM  Result Value Ref Range   Hgb A1c MFr Bld 4.5 (L) 4.8 - 5.6 %    Comment:          Prediabetes: 5.7 - 6.4          Diabetes: >6.4          Glycemic control for adults with diabetes: <7.0    Est. average glucose Bld gHb Est-mCnc 82 mg/dL  VITAMIN D  25 Hydroxy (Vit-D Deficiency, Fractures)     Status: Abnormal   Collection Time: 07/30/23  9:39 AM  Result Value Ref Range   Vit D, 25-Hydroxy  19.3 (L) 30.0 - 100.0 ng/mL    Comment: Vitamin D  deficiency has been defined by the Institute of Medicine and an Endocrine Society practice guideline as a level of serum 25-OH vitamin D  less than 20 ng/mL (1,2). The Endocrine Society went on to further define vitamin D  insufficiency as a level between 21 and 29 ng/mL (2). 1. IOM (Institute of Medicine). 2010. Dietary reference    intakes for calcium  and D. [place]: The    [employer]. 2. Holick MF, Binkley [place], Bischoff-Ferrari HA, et al.    Evaluation, treatment, and prevention of vitamin D     deficiency: an Endocrine Society clinical practice    guideline. JCEM. 2011 Jul; 96(7):1911-30.   TSH + free T4     Status: None   Collection Time: 07/30/23  9:39 AM  Result Value Ref Range   TSH 1.970 0.450 - 4.500 uIU/mL   Free T4 0.89 0.82 - 1.77 ng/dL  Lipid panel     Status: None   Collection Time: 07/30/23  9:39 AM  Result Value Ref Range   Cholesterol, Total 129 100 - 199 mg/dL   Triglycerides 61 0 - 149 mg/dL   HDL 61 >60 mg/dL   VLDL Cholesterol Cal 13 5 - 40 mg/dL   LDL Chol Calc (NIH) 55 0 - 99 mg/dL   Chol/HDL Ratio 2.1 0.0 - 4.4 ratio    Comment:  T. Chol/HDL Ratio                                             Men  Women                               1/2 Avg.Risk  3.4    3.3                                   Avg.Risk  5.0    4.4                                2X Avg.Risk  9.6    7.1                                3X Avg.Risk 23.4   11.0   CMP14+EGFR     Status: Abnormal   Collection Time: 07/30/23  9:39 AM  Result Value Ref Range   Glucose 87 70 - 99 mg/dL   BUN 5 (L) 6 - 24 mg/dL   Creatinine, Ser 9.58 (L) 0.57 - 1.00 mg/dL   eGFR 881 >40 fO/fpw/8.26   BUN/Creatinine Ratio 12 9 - 23   Sodium 132 (L) 134 - 144 mmol/L   Potassium 4.6 3.5 - 5.2 mmol/L   Chloride 96 96 - 106 mmol/L   CO2 21 20 - 29 mmol/L   Calcium  8.7 8.7 - 10.2 mg/dL   Total Protein 8.1 6.0 - 8.5 g/dL    Albumin 3.7 (L) 3.8 - 4.9 g/dL   Globulin, Total 4.4 1.5 - 4.5 g/dL   Bilirubin Total 1.0 0.0 - 1.2 mg/dL   Alkaline Phosphatase 111 44 - 121 IU/L   AST 138 (H) 0 - 40 IU/L   ALT 35 (H) 0 - 32 IU/L  CBC with Differential/Platelet     Status: Abnormal   Collection Time: 07/30/23  9:39 AM  Result Value Ref Range   WBC 4.2 3.4 - 10.8 x10E3/uL   RBC 3.28 (L) 3.77 - 5.28 x10E6/uL   Hemoglobin 9.4 (L) 11.1 - 15.9 g/dL   Hematocrit 70.8 (L) 65.9 - 46.6 %   MCV 89 79 - 97 fL   MCH 28.7 26.6 - 33.0 pg   MCHC 32.3 31.5 - 35.7 g/dL   RDW 84.1 (H) 88.2 - 84.5 %   Platelets 130 (L) 150 - 450 x10E3/uL   Neutrophils 50 Not Estab. %   Lymphs 34 Not Estab. %   Monocytes 13 Not Estab. %   Eos 2 Not Estab. %   Basos 1 Not Estab. %   Neutrophils Absolute 2.1 1.4 - 7.0 x10E3/uL   Lymphocytes Absolute 1.5 0.7 - 3.1 x10E3/uL   Monocytes Absolute 0.5 0.1 - 0.9 x10E3/uL   EOS (ABSOLUTE) 0.1 0.0 - 0.4 x10E3/uL   Basophils Absolute 0.0 0.0 - 0.2 x10E3/uL   Immature Granulocytes 0 Not Estab. %   Immature Grans (Abs) 0.0 0.0 - 0.1 x10E3/uL  ABO/Rh     Status: None   Collection Time: 08/04/23 11:14 PM  Result Value Ref Range   ABO/RH(D)      A  POS Performed at [facility], [address] [place], [name] [address]   Lipase, blood     Status: Abnormal   Collection Time: 08/04/23 11:19 PM  Result Value Ref Range   Lipase 68 (H) 11 - 51 U/L    Comment: Performed at [facility], [address], [place], [name] [address]  Comprehensive metabolic panel     Status: Abnormal   Collection Time: 08/04/23 11:19 PM  Result Value Ref Range   Sodium 122 (L) 135 - 145 mmol/L   Potassium 3.3 (L) 3.5 - 5.1 mmol/L   Chloride 88 (L) 98 - 111 mmol/L   CO2 19 (L) 22 - 32 mmol/L   Glucose, Bld 126 (H) 70 - 99 mg/dL    Comment: Glucose reference range applies only to samples taken after fasting for at least 8 hours.   BUN 7 6 - 20 mg/dL   Creatinine, Ser 9.42 0.44 - 1.00 mg/dL   Calcium  7.9 (L) 8.9 - 10.3 mg/dL    Total Protein 7.7 6.5 - 8.1 g/dL   Albumin 3.1 (L) 3.5 - 5.0 g/dL   AST 883 (H) 15 - 41 U/L   ALT 33 0 - 44 U/L   Alkaline Phosphatase 86 38 - 126 U/L   Total Bilirubin 1.7 (H) 0.0 - 1.2 mg/dL   GFR, Estimated >39 >39 mL/min    Comment: (NOTE) Calculated using the CKD-EPI Creatinine Equation (2021)    Anion gap 15 5 - 15    Comment: Performed at [facility], [address], [place], [name] [address]  CBC     Status: Abnormal   Collection Time: 08/04/23 11:19 PM  Result Value Ref Range   WBC 5.5 4.0 - 10.5 K/uL   RBC 3.47 (L) 3.87 - 5.11 MIL/uL   Hemoglobin 10.4 (L) 12.0 - 15.0 g/dL   HCT 68.7 (L) 63.9 - 53.9 %   MCV 89.9 80.0 - 100.0 fL   MCH 30.0 26.0 - 34.0 pg   MCHC 33.3 30.0 - 36.0 g/dL   RDW 81.6 (H) 88.4 - 84.4 %   Platelets 123 (L) 150 - 400 K/uL    Comment: SPECIMEN CHECKED FOR CLOTS REPEATED TO VERIFY    nRBC 0.0 0.0 - 0.2 %    Comment: Performed at [facility], [address] [place], [name] [address]  Protime-INR     Status: Abnormal   Collection Time: 08/04/23 11:27 PM  Result Value Ref Range   Prothrombin Time 16.6 (H) 11.4 - 15.2 seconds   INR 1.3 (H) 0.8 - 1.2    Comment: (NOTE) INR goal varies based on device and disease states. Performed at [facility], [address] [place], [name] [address]   APTT     Status: None   Collection Time: 08/04/23 11:27 PM  Result Value Ref Range   aPTT 33 24 - 36 seconds    Comment: Performed at [facility], [address], [place], [name] [address]  Ethanol     Status: Abnormal   Collection Time: 08/04/23 11:27 PM  Result Value Ref Range   Alcohol, Ethyl (B) 189 (H) <15 mg/dL    Comment: (NOTE) For medical purposes only. Performed at [facility], [address] [place], [name] [address]   Type and screen [facility]     Status: None   Collection Time: 08/05/23 12:05 AM  Result Value Ref Range   ABO/RH(D) A POS    Antibody Screen NEG    Sample Expiration 08/08/2023,2359  Unit Number  [id number]    Blood Component Type RED CELLS,LR    Unit division 00    Status of Unit REL FROM [facility]    Transfusion Status OK TO TRANSFUSE    Crossmatch Result NOT NEEDED    Unit Number [id number]    Blood Component Type RBC LR PHER2    Unit division 00    Status of Unit REL FROM [facility]    Transfusion Status OK TO TRANSFUSE    Crossmatch Result NOT NEEDED    Unit Number [id number]    Blood Component Type RED CELLS,LR    Unit division 00    Status of Unit ISSUED,FINAL    Transfusion Status OK TO TRANSFUSE    Crossmatch Result Compatible    Unit Number [id number]    Blood Component Type RED CELLS,LR    Unit division 00    Status of Unit ISSUED,FINAL    Transfusion Status OK TO TRANSFUSE    Crossmatch Result      Compatible Performed at [facility], [address], [place], [name] [address]   Prepare RBC (crossmatch)     Status: None   Collection Time: 08/05/23 12:05 AM  Result Value Ref Range   Order Confirmation      ORDER PROCESSED BY BLOOD BANK Performed at [facility], [address], [place], [name] [address]   BPAM RBC     Status: None   Collection Time: 08/05/23 12:05 AM  Result Value Ref Range   Blood Product Unit Number [id number]    PRODUCT CODE [id number]    Unit Type and Rh 6200    Blood Product Expiration Date 797491827640    Blood Product Unit Number [id number]    PRODUCT CODE [id number]    Unit Type and Rh 6200    Blood Product Expiration Date 797491787640    ISSUE DATE / TIME 797492709074    Blood Product Unit Number [id number]    PRODUCT CODE [id number]    Unit Type and Rh 6200    Blood Product Expiration Date 797491717640    ISSUE DATE / TIME 797492708743    Blood Product Unit Number [id number]    PRODUCT CODE [id number]    Unit Type and Rh 6200    Blood Product Expiration Date 797491717640   Osmolality     Status: Abnormal   Collection Time: 08/05/23 12:05 AM  Result Value Ref Range   Osmolality 307 (H) 275 - 295 mOsm/kg     Comment: Performed at [facility], [address] [place], [name] [address]  Prepare RBC (crossmatch)     Status: None   Collection Time: 08/05/23 12:05 AM  Result Value Ref Range   Order Confirmation      ORDER PROCESSED BY BLOOD BANK Performed at [facility], [address], [place], [name] [address]   Urinalysis, Routine w reflex microscopic -Urine, Clean Catch     Status: Abnormal   Collection Time: 08/05/23  2:24 AM  Result Value Ref Range   Color, Urine YELLOW YELLOW   APPearance CLEAR CLEAR   Specific Gravity, Urine 1.017 1.005 - 1.030   pH 6.0 5.0 - 8.0   Glucose, UA NEGATIVE NEGATIVE mg/dL   Hgb urine dipstick MODERATE (A) NEGATIVE   Bilirubin Urine NEGATIVE NEGATIVE   Ketones, ur NEGATIVE NEGATIVE mg/dL   Protein, ur NEGATIVE NEGATIVE mg/dL   Nitrite NEGATIVE NEGATIVE   Leukocytes,Ua TRACE (A) NEGATIVE   RBC / HPF 0-5 0 - 5 RBC/hpf   WBC,  UA 0-5 0 - 5 WBC/hpf   Bacteria, UA RARE (A) NONE SEEN   Squamous Epithelial / HPF 0-5 0 - 5 /HPF    Comment: Performed at [facility], [address], [place], [name] [address]  Na and K (sodium & potassium), rand urine     Status: None   Collection Time: 08/05/23  2:24 AM  Result Value Ref Range   Sodium, Ur 15 mmol/L   Potassium Urine 17 mmol/L    Comment: Performed at [facility], [address], [place], [name] [address]  Osmolality, urine     Status: Abnormal   Collection Time: 08/05/23  2:24 AM  Result Value Ref Range   Osmolality, Ur 225 (L) 300 - 900 mOsm/kg    Comment: Performed at [facility], [address], [place], [name] [address]  Surgical pathology     Status: None   Collection Time: 08/05/23  4:09 AM  Result Value Ref Range   SURGICAL PATHOLOGY      SURGICAL PATHOLOGY CASE: [id number] PATIENT: [name] Surgical Pathology Report     Clinical History: gastric perforation, free air, cirrhosis (tb)     FINAL MICROSCOPIC DIAGNOSIS:  A. UMBILICAL HERNIA SAC: - Consistent with  hernia sac  B. 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  Hepatic nodules show moderate macrovesicular steatosis (about 60%) with mild to moderate ballooning degeneration and scattered poorly formed Mallory hyalines. Portal tracts and fibrous septa mild lymphocytic inflammation with mild ductular reaction.  Trichrome and reticulin stains highlight nodular architecture, consistent with cirrhosis. Iron stain shows no abnormal iron accumulation. PASD stain shows no globular inclusions within hepatocytes. The find ings are that of mild to moderately active steatohepatitis, alcoholic and/or metabolic dysfunction associated, with cirrhosis.      GROSS DESCRIPTION:  A.  Received fresh is a 3.8 x 3.4 x 2.0 cm mass of well lobulated yellow adipose.  Sectioning reveals an underlying cavity measuring 3.1 cm in greatest dimension.  The inner wall has a thickness of 0.1 cm.  The remaining cut surface is homogenous and fatty, without further distinct lesions.  Representative sections are submitted in 1 block.  B.  Received fresh is a 1.8 x 1.5 x 1.2 cm fragment of tan-yellow, nodular soft tissue.  The specimen is serially sectioned revealing an underlying yellow-tan cut surface.  The specimen is entirely submitted in 2 blocks. (KW, 08/06/2023)   Final Diagnosis performed by Katrine Muskrat, MD.   Electronically signed 08/08/2023 Technical component performed at [facility], [address], [place], [name] [address].  Professional component performed at [facility], [address], [place], [name] [address].  Immunohistochemistry Technical component (if applicable) was performed at [facility]. [address], [place], [name] [address].   IMMUNOHISTOCHEMISTRY DISCLAIMER (if applicable): Some of these immunohistochemical stains may have been developed and the performance characteristics determine by [facility]. Some may not have been cleared or approved by the U.S. Food and Drug Administration. The FDA has determined that such clearance or approval is not necessary. This test is used for clinical purposes. It should not be regarded as investigational or for research. This laboratory is certified under the Clinical Laboratory Improvement Amendments of 1988 (  CLIA-88) as qualified to perform high complexity clinical laboratory testing.  The controls stained appropriately.   IHC stains are performed on formalin fixed, paraffin embedded tissue using a 3,3diaminobenzidin e (DAB) chromogen and Leica Bond Autostainer System. The staining intensity of the nucleus is score manually and is reported as the percentage of tumor cell nuclei demonstrating specific nuclear staining. The specimens are fixed in 10% Neutral Formalin for at least 6 hours and up to 72hrs. These tests are validated on decalcified tissue. Results should be interpreted with caution given the possibility of false negative results on decalcified specimens. Antibody Clones are as follows ER-clone 18F, PR-clone 16, Ki67- clone MM1. Some of these immunohistochemical stains may have been developed and the performance characteristics determined by [facility].   Aerobic/Anaerobic Culture w Gram Stain (surgical/deep wound)     Status: None   Collection Time: 08/05/23  4:56 AM   Specimen: Path fluid; Body Fluid  Result Value Ref Range   Specimen Description      FLUID Performed at [facility], [address], [place], [name] [address]    Special Requests      NONE Performed at [facility], [address] [place], [name] [address]    Gram Stain      RARE WBC SEEN RARE GRAM POSITIVE COCCI RARE  GRAM NEGATIVE RODS    Culture      FEW MULTIPLE ORGANISMS PRESENT, NONE PREDOMINANT NO GROUP A STREP (S.PYOGENES) ISOLATED NO STAPHYLOCOCCUS AUREUS ISOLATED NO ANAEROBES ISOLATED Performed at [facility], [address], [place], [name] [address]    Report Status 08/10/2023 FINAL   Basic metabolic panel     Status: Abnormal   Collection Time: 08/05/23  6:42 AM  Result Value Ref Range   Sodium 126 (L) 135 - 145 mmol/L   Potassium 4.0 3.5 - 5.1 mmol/L   Chloride 98 98 - 111 mmol/L   CO2 18 (L) 22 - 32 mmol/L   Glucose, Bld 92 70 - 99 mg/dL    Comment: Glucose reference range applies only to samples taken after fasting for at least 8 hours.   BUN 8 6 - 20 mg/dL   Creatinine, Ser 9.39 0.44 - 1.00 mg/dL   Calcium  7.2 (L) 8.9 - 10.3 mg/dL   GFR, Estimated >39 >39 mL/min    Comment: (NOTE) Calculated using the CKD-EPI Creatinine Equation (2021)    Anion gap 10 5 - 15    Comment: Performed at [facility], [address] [place], [name] [address]  MRSA Next Gen by PCR, Nasal     Status: None   Collection Time: 08/05/23  7:53 AM   Specimen: Nasal Mucosa; Nasal Swab  Result Value Ref Range   MRSA by PCR Next Gen NOT DETECTED NOT DETECTED    Comment: (NOTE) The GeneXpert MRSA Assay (FDA approved for NASAL specimens only), is one component of a comprehensive MRSA colonization surveillance program. It is not intended to diagnose MRSA infection nor to guide or monitor treatment for MRSA infections. Test performance is not FDA approved in patients less than 48 years old. Performed at [facility], [address], [place], [name] [address]   Basic metabolic panel     Status: Abnormal   Collection Time: 08/05/23 11:54 AM  Result Value Ref Range   Sodium 126 (L) 135 - 145 mmol/L   Potassium 3.9 3.5 - 5.1 mmol/L   Chloride 97 (L) 98 - 111 mmol/L   CO2 18 (L) 22 - 32  mmol/L   Glucose, Bld 139 (H) 70 - 99 mg/dL    Comment: Glucose reference range applies only to samples taken  after fasting for at least 8 hours.   BUN 9 6 - 20 mg/dL   Creatinine, Ser 9.32 0.44 - 1.00 mg/dL   Calcium  7.5 (L) 8.9 - 10.3 mg/dL   GFR, Estimated >39 >39 mL/min    Comment: (NOTE) Calculated using the CKD-EPI Creatinine Equation (2021)    Anion gap 11 5 - 15    Comment: Performed at [facility], [address] [place], [name] [address]  Basic metabolic panel     Status: Abnormal   Collection Time: 08/05/23  5:51 PM  Result Value Ref Range   Sodium 127 (L) 135 - 145 mmol/L   Potassium 4.3 3.5 - 5.1 mmol/L   Chloride 100 98 - 111 mmol/L   CO2 18 (L) 22 - 32 mmol/L   Glucose, Bld 132 (H) 70 - 99 mg/dL    Comment: Glucose reference range applies only to samples taken after fasting for at least 8 hours.   BUN 9 6 - 20 mg/dL   Creatinine, Ser 9.44 0.44 - 1.00 mg/dL   Calcium  7.7 (L) 8.9 - 10.3 mg/dL   GFR, Estimated >39 >39 mL/min    Comment: (NOTE) Calculated using the CKD-EPI Creatinine Equation (2021)    Anion gap 9 5 - 15    Comment: Performed at [facility], [address] [place], [name] [address]  Basic metabolic panel     Status: Abnormal   Collection Time: 08/06/23 12:34 AM  Result Value Ref Range   Sodium 129 (L) 135 - 145 mmol/L   Potassium 4.4 3.5 - 5.1 mmol/L   Chloride 99 98 - 111 mmol/L   CO2 21 (L) 22 - 32 mmol/L   Glucose, Bld 125 (H) 70 - 99 mg/dL    Comment: Glucose reference range applies only to samples taken after fasting for at least 8 hours.   BUN 10 6 - 20 mg/dL   Creatinine, Ser 9.53 0.44 - 1.00 mg/dL   Calcium  8.0 (L) 8.9 - 10.3 mg/dL   GFR, Estimated >39 >39 mL/min    Comment: (NOTE) Calculated using the CKD-EPI Creatinine Equation (2021)    Anion gap 9 5 - 15    Comment: Performed at [facility], [address] [place], [name] [address]  CBC     Status: Abnormal   Collection Time: 08/06/23  4:19 AM  Result Value Ref Range   WBC 5.4 4.0 - 10.5 K/uL   RBC 2.63 (L) 3.87 - 5.11 MIL/uL   Hemoglobin 7.8 (L) 12.0 - 15.0 g/dL   HCT 75.5 (L)  63.9 - 46.0 %   MCV 92.8 80.0 - 100.0 fL   MCH 29.7 26.0 - 34.0 pg   MCHC 32.0 30.0 - 36.0 g/dL   RDW 82.1 (H) 88.4 - 84.4 %   Platelets 76 (L) 150 - 400 K/uL    Comment: Immature Platelet Fraction may be clinically indicated, consider ordering this additional test [id number]    nRBC 0.0 0.0 - 0.2 %    Comment: Performed at [facility], [address] [place], [name] [address]  Comprehensive metabolic panel     Status: Abnormal   Collection Time: 08/06/23  4:19 AM  Result Value Ref Range   Sodium 128 (L) 135 - 145 mmol/L   Potassium 4.2 3.5 - 5.1 mmol/L   Chloride 100 98 - 111 mmol/L   CO2  23 22 - 32 mmol/L   Glucose, Bld 116 (H) 70 - 99 mg/dL    Comment: Glucose reference range applies only to samples taken after fasting for at least 8 hours.   BUN 10 6 - 20 mg/dL   Creatinine, Ser 9.55 0.44 - 1.00 mg/dL   Calcium  7.8 (L) 8.9 - 10.3 mg/dL   Total Protein 5.8 (L) 6.5 - 8.1 g/dL   Albumin 2.2 (L) 3.5 - 5.0 g/dL   AST 73 (H) 15 - 41 U/L   ALT 23 0 - 44 U/L   Alkaline Phosphatase 42 38 - 126 U/L   Total Bilirubin 1.4 (H) 0.0 - 1.2 mg/dL   GFR, Estimated >39 >39 mL/min    Comment: (NOTE) Calculated using the CKD-EPI Creatinine Equation (2021)    Anion gap 5 5 - 15    Comment: Performed at [facility], [address], [place], [name] [address]  Magnesium      Status: None   Collection Time: 08/06/23  4:19 AM  Result Value Ref Range   Magnesium  1.7 1.7 - 2.4 mg/dL    Comment: Performed at [facility], [address], [place], [name] [address]  Phosphorus     Status: Abnormal   Collection Time: 08/06/23  4:19 AM  Result Value Ref Range   Phosphorus 2.0 (L) 2.5 - 4.6 mg/dL    Comment: Performed at [facility], [address], [place], [name] [address]  Iron and TIBC     Status: Abnormal   Collection Time: 08/06/23  4:19 AM  Result Value Ref Range   Iron 8 (L) 28 - 170 ug/dL   TIBC 689 749 - 549 ug/dL   Saturation Ratios 3 (L) 10.4 - 31.8 %   UIBC 302 ug/dL    Comment:  Performed at [facility], [address] [place], [name] [address]  Vitamin B12     Status: None   Collection Time: 08/06/23  4:19 AM  Result Value Ref Range   Vitamin B-12 757 180 - 914 pg/mL    Comment: (NOTE) This assay is not validated for testing neonatal or myeloproliferative syndrome specimens for Vitamin B12 levels. Performed at [facility], [address] [place], [name] [address]   Folate     Status: None   Collection Time: 08/06/23  4:19 AM  Result Value Ref Range   Folate 6.5 >5.9 ng/mL    Comment: Performed at [facility], [address], [place], [name] [address]  Glucose, capillary     Status: Abnormal   Collection Time: 08/06/23  7:44 AM  Result Value Ref Range   Glucose-Capillary 129 (H) 70 - 99 mg/dL    Comment: Glucose reference range applies only to samples taken after fasting for at least 8 hours.  Glucose, capillary     Status: Abnormal   Collection Time: 08/06/23 11:45 AM  Result Value Ref Range   Glucose-Capillary 128 (H) 70 - 99 mg/dL    Comment: Glucose reference range applies only to samples taken after fasting for at least 8 hours.  Protime-INR     Status: Abnormal   Collection Time: 08/06/23 12:01 PM  Result Value Ref Range   Prothrombin Time 19.2 (H) 11.4 - 15.2 seconds   INR 1.5 (H) 0.8 - 1.2    Comment: (NOTE) INR goal varies based on device and disease states. Performed at [facility], [address] [place], [name] [address]   Glucose, capillary     Status: None   Collection Time:  08/06/23  4:28 PM  Result Value Ref Range   Glucose-Capillary 89 70 - 99 mg/dL    Comment: Glucose reference range applies only to samples taken after fasting for at least 8 hours.  Glucose, capillary     Status: Abnormal   Collection Time: 08/07/23 12:55 AM  Result Value Ref Range   Glucose-Capillary 119 (H) 70 - 99 mg/dL    Comment: Glucose reference range applies only to samples taken after fasting for at least 8 hours.  CBC     Status: Abnormal    Collection Time: 08/07/23  3:02 AM  Result Value Ref Range   WBC 5.0 4.0 - 10.5 K/uL   RBC 2.43 (L) 3.87 - 5.11 MIL/uL   Hemoglobin 7.1 (L) 12.0 - 15.0 g/dL   HCT 77.2 (L) 63.9 - 53.9 %   MCV 93.4 80.0 - 100.0 fL   MCH 29.2 26.0 - 34.0 pg   MCHC 31.3 30.0 - 36.0 g/dL   RDW 81.7 (H) 88.4 - 84.4 %   Platelets 76 (L) 150 - 400 K/uL    Comment: SPECIMEN CHECKED FOR CLOTS Immature Platelet Fraction may be clinically indicated, consider ordering this additional test [id number] REPEATED TO VERIFY PLATELET COUNT CONFIRMED BY SMEAR    nRBC 0.0 0.0 - 0.2 %    Comment: Performed at [facility], [address] [place], [name] [address]  Comprehensive metabolic panel     Status: Abnormal   Collection Time: 08/07/23  3:02 AM  Result Value Ref Range   Sodium 125 (L) 135 - 145 mmol/L   Potassium 3.9 3.5 - 5.1 mmol/L   Chloride 95 (L) 98 - 111 mmol/L   CO2 25 22 - 32 mmol/L   Glucose, Bld 107 (H) 70 - 99 mg/dL    Comment: Glucose reference range applies only to samples taken after fasting for at least 8 hours.   BUN 6 6 - 20 mg/dL   Creatinine, Ser 9.50 0.44 - 1.00 mg/dL   Calcium  7.8 (L) 8.9 - 10.3 mg/dL   Total Protein 5.5 (L) 6.5 - 8.1 g/dL   Albumin 1.9 (L) 3.5 - 5.0 g/dL   AST 54 (H) 15 - 41 U/L   ALT 20 0 - 44 U/L   Alkaline Phosphatase 39 38 - 126 U/L   Total Bilirubin 0.8 0.0 - 1.2 mg/dL   GFR, Estimated >39 >39 mL/min    Comment: (NOTE) Calculated using the CKD-EPI Creatinine Equation (2021)    Anion gap 5 5 - 15    Comment: Performed at [facility], [address] [place], [name] [address]  Magnesium      Status: Abnormal   Collection Time: 08/07/23  3:02 AM  Result Value Ref Range   Magnesium  1.6 (L) 1.7 - 2.4 mg/dL    Comment: Performed at [facility], [address], [place], [name] [address]  Phosphorus     Status: None   Collection Time: 08/07/23  3:02 AM  Result Value Ref Range   Phosphorus 2.6 2.5 - 4.6 mg/dL    Comment: Performed at [facility], [address], [place], [name] [address]  Protime-INR     Status: Abnormal   Collection Time: 08/07/23  3:02 AM  Result Value Ref Range   Prothrombin Time 18.3 (H) 11.4 - 15.2 seconds   INR 1.4 (H) 0.8 - 1.2    Comment: (NOTE) INR goal varies based on device and disease states. Performed at [facility], [address] [place], [name] [address]  Glucose, capillary     Status: Abnormal   Collection Time: 08/07/23  4:59 AM  Result Value Ref Range   Glucose-Capillary 112 (H) 70 - 99 mg/dL    Comment: Glucose reference range applies only to samples taken after fasting for at least 8 hours.  Glucose, capillary     Status: Abnormal   Collection Time: 08/07/23  7:45 AM  Result Value Ref Range   Glucose-Capillary 115 (H) 70 - 99 mg/dL    Comment: Glucose reference range applies only to samples taken after fasting for at least 8 hours.  Glucose, capillary     Status: Abnormal   Collection Time: 08/07/23 11:04 AM  Result Value Ref Range   Glucose-Capillary 112 (H) 70 - 99 mg/dL    Comment: Glucose reference range applies only to samples taken after fasting for at least 8 hours.   Comment 1 Notify RN    Comment 2 Document in Chart   Glucose, capillary     Status: None   Collection Time: 08/07/23  6:10 PM  Result Value Ref Range   Glucose-Capillary 97 70 - 99 mg/dL    Comment: Glucose reference range applies only to samples taken after fasting for at least 8 hours.   Comment 1 Notify RN    Comment 2 Document in Chart   Hemoglobin and hematocrit, blood     Status: Abnormal   Collection Time: 08/07/23  7:27 PM  Result Value Ref Range   Hemoglobin 10.3 (L) 12.0 - 15.0 g/dL   HCT 69.0 (L) 63.9 - 53.9 %    Comment: Performed at [facility], [address], [place], [name] [address]  Glucose, capillary     Status: Abnormal   Collection Time: 08/07/23  8:04 PM  Result Value Ref Range   Glucose-Capillary 101 (H) 70 - 99 mg/dL    Comment: Glucose reference range applies only to samples taken after  fasting for at least 8 hours.  Glucose, capillary     Status: Abnormal   Collection Time: 08/08/23 12:01 AM  Result Value Ref Range   Glucose-Capillary 119 (H) 70 - 99 mg/dL    Comment: Glucose reference range applies only to samples taken after fasting for at least 8 hours.   Comment 1 Notify RN    Comment 2 Document in Chart   Magnesium      Status: Abnormal   Collection Time: 08/08/23  4:23 AM  Result Value Ref Range   Magnesium  1.5 (L) 1.7 - 2.4 mg/dL    Comment: Performed at [facility], [address], [place], [name] [address]  Phosphorus     Status: None   Collection Time: 08/08/23  4:23 AM  Result Value Ref Range   Phosphorus 3.4 2.5 - 4.6 mg/dL    Comment: Performed at [facility], [address] [place], [name] [address]  Comprehensive metabolic panel     Status: Abnormal   Collection Time: 08/08/23  4:23 AM  Result Value Ref Range   Sodium 121 (L) 135 - 145 mmol/L   Potassium 3.4 (L) 3.5 - 5.1 mmol/L   Chloride 89 (L) 98 - 111 mmol/L   CO2 26 22 - 32 mmol/L   Glucose, Bld 100 (H) 70 - 99 mg/dL    Comment: Glucose reference range applies only to samples taken after fasting for at least 8 hours.   BUN 7 6 - 20 mg/dL   Creatinine, Ser <9.69 (L) 0.44 - 1.00 mg/dL   Calcium  7.7 (L) 8.9 -  10.3 mg/dL   Total Protein 5.8 (L) 6.5 - 8.1 g/dL   Albumin 2.1 (L) 3.5 - 5.0 g/dL   AST 43 (H) 15 - 41 U/L   ALT 17 0 - 44 U/L   Alkaline Phosphatase 46 38 - 126 U/L   Total Bilirubin 1.4 (H) 0.0 - 1.2 mg/dL   GFR, Estimated NOT CALCULATED >60 mL/min    Comment: (NOTE) Calculated using the CKD-EPI Creatinine Equation (2021)    Anion gap 6 5 - 15    Comment: Performed at [facility], [address], [place], [name] [address]  Glucose, capillary     Status: Abnormal   Collection Time: 08/08/23  6:18 AM  Result Value Ref Range   Glucose-Capillary 117 (H) 70 - 99 mg/dL    Comment: Glucose reference range applies only to samples taken after fasting for at least 8 hours.   Comment  1 Notify RN    Comment 2 Document in Chart   Glucose, capillary     Status: Abnormal   Collection Time: 08/08/23 11:24 AM  Result Value Ref Range   Glucose-Capillary 130 (H) 70 - 99 mg/dL    Comment: Glucose reference range applies only to samples taken after fasting for at least 8 hours.  Glucose, capillary     Status: Abnormal   Collection Time: 08/08/23  4:29 PM  Result Value Ref Range   Glucose-Capillary 113 (H) 70 - 99 mg/dL    Comment: Glucose reference range applies only to samples taken after fasting for at least 8 hours.  Glucose, capillary     Status: None   Collection Time: 08/08/23 11:50 PM  Result Value Ref Range   Glucose-Capillary 81 70 - 99 mg/dL    Comment: Glucose reference range applies only to samples taken after fasting for at least 8 hours.   Comment 1 Notify RN    Comment 2 Document in Chart   Magnesium      Status: None   Collection Time: 08/09/23  3:22 AM  Result Value Ref Range   Magnesium  1.7 1.7 - 2.4 mg/dL    Comment: Performed at [facility], [address] [place], [name] [address]  Comprehensive metabolic panel     Status: Abnormal   Collection Time: 08/09/23  3:22 AM  Result Value Ref Range   Sodium 125 (L) 135 - 145 mmol/L   Potassium 3.7 3.5 - 5.1 mmol/L   Chloride 94 (L) 98 - 111 mmol/L   CO2 25 22 - 32 mmol/L   Glucose, Bld 105 (H) 70 - 99 mg/dL    Comment: Glucose reference range applies only to samples taken after fasting for at least 8 hours.   BUN 10 6 - 20 mg/dL   Creatinine, Ser 9.63 (L) 0.44 - 1.00 mg/dL   Calcium  7.8 (L) 8.9 - 10.3 mg/dL   Total Protein 5.3 (L) 6.5 - 8.1 g/dL   Albumin 1.8 (L) 3.5 - 5.0 g/dL   AST 32 15 - 41 U/L   ALT 15 0 - 44 U/L   Alkaline Phosphatase 45 38 - 126 U/L   Total Bilirubin 1.3 (H) 0.0 - 1.2 mg/dL   GFR, Estimated >39 >39 mL/min    Comment: (NOTE) Calculated using the CKD-EPI Creatinine Equation (2021)    Anion gap 6 5 - 15    Comment: Performed at [facility] [place] [facility], [address] [place],  [name] [address]  Phosphorus     Status: None   Collection Time: 08/09/23  3:22 AM  Result Value Ref Range   Phosphorus 3.8 2.5 - 4.6 mg/dL    Comment: Performed at [facility], [address], [place], [name] [address]  CBC     Status: Abnormal   Collection Time: 08/09/23  3:22 AM  Result Value Ref Range   WBC 5.5 4.0 - 10.5 K/uL   RBC 3.45 (L) 3.87 - 5.11 MIL/uL   Hemoglobin 10.3 (L) 12.0 - 15.0 g/dL   HCT 69.7 (L) 63.9 - 53.9 %   MCV 87.5 80.0 - 100.0 fL   MCH 29.9 26.0 - 34.0 pg   MCHC 34.1 30.0 - 36.0 g/dL   RDW 82.8 (H) 88.4 - 84.4 %   Platelets 81 (L) 150 - 400 K/uL    Comment: SPECIMEN CHECKED FOR CLOTS PLATELET COUNT CONFIRMED BY SMEAR Immature Platelet Fraction may be clinically indicated, consider ordering this additional test [id number]    nRBC 0.0 0.0 - 0.2 %    Comment: Performed at [facility], [address], [place], [name] [address]  Glucose, capillary     Status: Abnormal   Collection Time: 08/09/23  8:00 AM  Result Value Ref Range   Glucose-Capillary 117 (H) 70 - 99 mg/dL    Comment: Glucose reference range applies only to samples taken after fasting for at least 8 hours.  Glucose, capillary     Status: Abnormal   Collection Time: 08/09/23 11:31 AM  Result Value Ref Range   Glucose-Capillary 119 (H) 70 - 99 mg/dL    Comment: Glucose reference range applies only to samples taken after fasting for at least 8 hours.  Protime-INR     Status: Abnormal   Collection Time: 08/09/23 11:42 AM  Result Value Ref Range   Prothrombin Time 17.4 (H) 11.4 - 15.2 seconds   INR 1.4 (H) 0.8 - 1.2    Comment: (NOTE) INR goal varies based on device and disease states. Performed at [facility], [address] [place], [name] [address]   Glucose, capillary     Status: Abnormal   Collection Time: 08/09/23  5:21 PM  Result Value Ref Range   Glucose-Capillary 104 (H) 70 - 99 mg/dL    Comment: Glucose reference range applies only to samples taken after fasting for at least 8  hours.  Cytology - Non PAP;     Status: None   Collection Time: 08/09/23  5:44 PM  Result Value Ref Range   CYTOLOGY - NON GYN      CYTOLOGY - NON PAP CASE: [id number] PATIENT: [name] Non-Gynecological Cytology Report     Clinical History: Perforated gastric ulcer Specimen Submitted:  A. JP DRAIN:   FINAL MICROSCOPIC DIAGNOSIS: - No malignant cells identified  SPECIMEN ADEQUACY: Satisfactory for evaluation  DIAGNOSTIC COMMENTS: Reactive probable mesothelial cells are present.  No malignant cells are seen.  GROSS: Received are 20 cc's of yellow, cloudy fluid (SA:sa) Smears: 0 Concentration Method (Thin Prep): 1 Cell Block: Cell block attempted but not obtained Additional Studies: N/A     Final Diagnosis performed by Doyle Maxin, MD.   Electronically signed 08/13/2023 Technical component performed at [facility], [address], [place], [name] [address].  Professional component performed at [employer]. [facility], [address], [place], [name] [address].  Immunohistochemistry Technical component (if applicable) was performed at [employer] [facility]. [address], [place], [name] [address].   IMMUNOHISTOCHEMISTRY DISCLAIMER (if applicable): Some of these immunohistochemical stains may have been developed and the performance characteristics  determine by [facility]. Some may not have been cleared or approved by the U.S. Food and Drug Administration. The FDA has determined that such clearance or approval is not necessary. This test is used for clinical purposes. It should not be regarded as investigational or for research. This laboratory is certified under the Clinical Laboratory Improvement Amendments of 1988 (CLIA-88) as qualified to perform high complexity clinical laboratory testing.  The controls stained appropriately.   IHC stains are performed on formalin fixed, paraffin embedded  tissue using a 3,3diaminobenzidine (DAB) chromogen and Leica Bond Autostainer System. The staining intensity of the nucleus is score manually and is reported as the percentage of tumor ce ll nuclei demonstrating specific nuclear staining. The specimens are fixed in 10% Neutral Formalin for at least 6 hours and up to 72hrs. These tests are validated on decalcified tissue. Results should be interpreted with caution given the possibility of false negative results on decalcified specimens. Antibody Clones are as follows ER-clone 74F, PR-clone 16, Ki67- clone MM1. Some of these immunohistochemical stains may have been developed and the performance characteristics determined by [facility].   Protein, total     Status: Abnormal   Collection Time: 08/09/23  6:06 PM  Result Value Ref Range   Total Protein 5.7 (L) 6.5 - 8.1 g/dL    Comment: Performed at [facility], [address] [place], [name] [address]  Albumin, pleural or peritoneal fluid      Status: None   Collection Time: 08/09/23  6:06 PM  Result Value Ref Range   Albumin, Fluid <1.5 g/dL    Comment: (NOTE) No normal range established for this test Results should be evaluated in conjunction with serum values    Fluid Type-FALB JP DRAINAGE     Comment: Performed at [facility], [address], [place], [name] [address]  Amylase     Status: Abnormal   Collection Time: 08/09/23  6:06 PM  Result Value Ref Range   Amylase 123 (H) 28 - 100 U/L    Comment: Performed at [facility], [address] [place], [name] [address]  Lactate dehydrogenase     Status: None   Collection Time: 08/09/23  6:06 PM  Result Value Ref Range   LDH 159 98 - 192 U/L    Comment: Performed at [facility], [address], [place], [name] [address]  Albumin     Status: Abnormal   Collection Time: 08/09/23  6:06 PM  Result Value Ref Range   Albumin 2.0 (L) 3.5 - 5.0 g/dL    Comment: Performed at [facility], [address], [place], [name]  [address]  Body fluid cell count with differential     Status: Abnormal   Collection Time: 08/09/23  6:06 PM  Result Value Ref Range   Fluid Type-FCT JP DRAINAGE    Color, Fluid YELLOW (A) YELLOW   Appearance, Fluid HAZY (A) CLEAR   Total Nucleated Cell Count, Fluid 1,616 (H) 0 - 1,000 cu mm   Neutrophil Count, Fluid 52 (H) 0 - 25 %   Lymphs, Fluid 17 %   Monocyte-Macrophage-Serous Fluid 30 (L) 50 - 90 %   Eos, Fluid 1 %    Comment: Performed at [facility], [address], [place], [name] [address]  Protein, body fluid (other)     Status: None   Collection Time: 08/09/23  6:06 PM  Result Value Ref Range   Total Protein, Body Fluid Other 2.4 g/dL    Comment: (NOTE)  _________________________________________            : BODY FLUID TYPE :     TOTAL PROTEIN     :            :_________________:_______________________:            : Amniotic Fluid  :               <0.4    :            :_________________:_______________________:            :                 : Nonmalignant: <3.0    :            : Ascitic Fluid   : Malignant:    >3.0    :            :_________________:_______________________:            : Bile, Clear     :               <0.9    :            :_________________:_______________________:            : Bile, Yellow    :          0.2 - 0.6    :            :_________________:_______________________:            : Lymph           :          2.2 - 6.0    :            :_________________:_______________________:            : Human Milk      :          1.9 - 2.0    :            :_________________: ______________________:            : Nasal Secretion :          0.1 - 3.5    :            :_________________:___________ ____________:            : Pancreatic      :          0.0 - 0.1    :            : Juice           :    (post stimulation) :            :_________________:_______________________:            :                 : Transudate:   <0.3    :            : Pleural Fluid    : Exudate:      >0.3    :            :_________________:_______________________:            : Saliva          :          0.1 - 0.2    :            : (  Mixed Glands)  :                       :            :_________________:_______________________:            : Synovial Fluid  :               <2.5    :            :_________________:_______________________:            : Tears           :          0.8 - 0.9    :            :_________________:_______________________:             [place]wen ORN, Gwynn [place] Reference Intervals             for  Adults and Children 2008. Ninth             Edition (V9.1) [employer],             [place]; [place]: July 2009. Performed At: [facility] [address] [place], [name] [id number] Jennette Shorter MD Ey:[phone]    Source of Sample JP DRAINAGE     Comment: Performed at [facility], [address], [place], [name] [address]  LD, Body Fluid (other)     Status: None   Collection Time: 08/09/23  6:06 PM  Result Value Ref Range   Source of Sample JP DRAINAGE     Comment: Performed at [facility], [address] [place], [name] [address]   LD, Body Fluid 279 IU/L    Comment: (NOTE)             _________________________________________            : BODY FLUID TYPE :          LDH          :            :_________________:_______________________:            :                 : Nonmalignant:  < 60%  :            :                 :  of the serum LDH     :            : Ascitic Fluid   : Malignant:     > 60%  :            :                 :  of the serum LDH     :            :_________________:_______________________:            : Gastric Juice   :                < 35   :            :_________________:_______________________:            :                 : Transudate:    <  200   :            : Pleural Fluid   : Exudate:       >200   :            :_________________:_______________________:            : Saliva          :           113 -  609   :            : (Mixed Glands)  :                       :            :_________________:_______________________:            : Synovial Fluid  :                <240   :            :_________________:___________ ____________:             Bronwen ORN, Gwynn GAILS. Reference Intervals             for Adults and Children 2008. Ninth             Edition (V9.1) [employer],             [place]; [place]: July 2009. Performed At: [facility] [address] [place], [name] [id number] Jennette Shorter MD Ey:[phone]   Glucose, capillary     Status: Abnormal   Collection Time: 08/10/23 12:26 AM  Result Value Ref Range   Glucose-Capillary 104 (H) 70 - 99 mg/dL    Comment: Glucose reference range applies only to samples taken after fasting for at least 8 hours.  Magnesium      Status: None   Collection Time: 08/10/23  3:49 AM  Result Value Ref Range   Magnesium  1.7 1.7 - 2.4 mg/dL    Comment: Performed at [facility], [address] [place], [name] [address]  CBC     Status: Abnormal   Collection Time: 08/10/23  3:49 AM  Result Value Ref Range   WBC 5.6 4.0 - 10.5 K/uL   RBC 3.46 (L) 3.87 - 5.11 MIL/uL   Hemoglobin 10.1 (L) 12.0 - 15.0 g/dL   HCT 69.3 (L) 63.9 - 53.9 %   MCV 88.4 80.0 - 100.0 fL   MCH 29.2 26.0 - 34.0 pg   MCHC 33.0 30.0 - 36.0 g/dL   RDW 82.7 (H) 88.4 - 84.4 %   Platelets 88 (L) 150 - 400 K/uL    Comment: REPEATED TO VERIFY Immature Platelet Fraction may be clinically indicated, consider ordering this additional test [id number]    nRBC 0.0 0.0 - 0.2 %    Comment: Performed at [facility], [address] [place], [name] [address]  Comprehensive metabolic panel     Status: Abnormal   Collection Time: 08/10/23  3:49 AM  Result Value Ref Range   Sodium 125 (L) 135 - 145 mmol/L   Potassium 3.7 3.5 - 5.1 mmol/L   Chloride 93 (L) 98 - 111 mmol/L   CO2 26 22 - 32 mmol/L   Glucose, Bld 99 70 - 99 mg/dL    Comment: Glucose reference range applies  only to samples taken after fasting for at least 8 hours.   BUN 11 6 - 20 mg/dL   Creatinine,  Ser <0.30 (L) 0.44 - 1.00 mg/dL   Calcium  7.9 (L) 8.9 - 10.3 mg/dL   Total Protein 5.7 (L) 6.5 - 8.1 g/dL   Albumin 1.9 (L) 3.5 - 5.0 g/dL   AST 33 15 - 41 U/L   ALT 14 0 - 44 U/L   Alkaline Phosphatase 53 38 - 126 U/L   Total Bilirubin 1.2 0.0 - 1.2 mg/dL   GFR, Estimated NOT CALCULATED >60 mL/min    Comment: (NOTE) Calculated using the CKD-EPI Creatinine Equation (2021)    Anion gap 6 5 - 15    Comment: Performed at [facility], [address], [place], [name] [address]  Phosphorus     Status: None   Collection Time: 08/10/23  3:49 AM  Result Value Ref Range   Phosphorus 3.9 2.5 - 4.6 mg/dL    Comment: Performed at [facility], [address] [place], [name] [address]  Glucose, capillary     Status: Abnormal   Collection Time: 08/10/23  8:38 AM  Result Value Ref Range   Glucose-Capillary 114 (H) 70 - 99 mg/dL    Comment: Glucose reference range applies only to samples taken after fasting for at least 8 hours.  Glucose, capillary     Status: Abnormal   Collection Time: 08/10/23  4:38 PM  Result Value Ref Range   Glucose-Capillary 114 (H) 70 - 99 mg/dL    Comment: Glucose reference range applies only to samples taken after fasting for at least 8 hours.  Glucose, capillary     Status: None   Collection Time: 08/10/23  8:40 PM  Result Value Ref Range   Glucose-Capillary 96 70 - 99 mg/dL    Comment: Glucose reference range applies only to samples taken after fasting for at least 8 hours.   Comment 1 Notify RN    Comment 2 Document in Chart   Glucose, capillary     Status: Abnormal   Collection Time: 08/11/23 12:11 AM  Result Value Ref Range   Glucose-Capillary 104 (H) 70 - 99 mg/dL    Comment: Glucose reference range applies only to samples taken after fasting for at least 8 hours.  Protein, body fluid (other)     Status: None   Collection Time: 08/11/23  3:54 AM  Result Value  Ref Range   Total Protein, Body Fluid Other 2.0 g/dL    Comment: (NOTE)             _________________________________________            : BODY FLUID TYPE :     TOTAL PROTEIN     :            :_________________:_______________________:            : Amniotic Fluid  :               <0.4    :            :_________________:_______________________:            :                 : Nonmalignant: <3.0    :            : Ascitic Fluid   : Malignant:    >3.0    :            :_________________:_______________________:            : Bile, Clear     :               <  0.9    :            :_________________:_______________________:            : Bile, Yellow    :          0.2 - 0.6    :            :_________________:_______________________:            : Lymph           :          2.2 - 6.0    :            :_________________:_______________________:            : Human Milk      :          1.9 - 2.0    :            :_________________: ______________________:            : Nasal Secretion :          0.1 - 3.5    :            :_________________:___________ ____________:            : Pancreatic      :          0.0 - 0.1    :            : Juice           :    (post stimulation) :            :_________________:_______________________:            :                 : Transudate:   <0.3    :            : Pleural Fluid   : Exudate:      >0.3    :            :_________________:_______________________:            : Saliva          :          0.1 - 0.2    :            : (Mixed Glands)  :                       :            :_________________:_______________________:            : Synovial Fluid  :               <2.5    :            :_________________:_______________________:            : Tears           :          0.8 - 0.9    :            :_________________:_______________________:             [place] ORN, Gwynn [place] Reference Intervals             for  Adults and Children 2008. Ninth  Edition (V9.1) [employer],             [place]; [place]: July 2009. Performed At: [facility] [address] [place], [name] [id number] Jennette Shorter MD Ey:[phone]    Source of Sample JP DRAIN     Comment: Performed at [facility], [address] [place], [name] [address]  ALBUMIN, FLUID (Other)     Status: None   Collection Time: 08/11/23  3:54 AM  Result Value Ref Range   Source of Sample JP DRAIN     Comment: Performed at [facility], [address] [place], [name] [address]   Albumin, Body Fluid Other 1.0 Not Estab. g/dL    Comment: (NOTE) The reference interval(s) and other method performance specifications have not been established for this body fluid. The test result must be integrated into the clinical context for interpretation. Performed At: [facility] [address] [place], [name] [id number] Jennette Shorter MD Ey:[phone]   Magnesium      Status: None   Collection Time: 08/11/23  4:30 AM  Result Value Ref Range   Magnesium  1.9 1.7 - 2.4 mg/dL    Comment: Performed at [facility], [address], [place], [name] [address]  CBC     Status: Abnormal   Collection Time: 08/11/23  4:30 AM  Result Value Ref Range   WBC 5.8 4.0 - 10.5 K/uL   RBC 3.46 (L) 3.87 - 5.11 MIL/uL   Hemoglobin 9.9 (L) 12.0 - 15.0 g/dL   HCT 68.7 (L) 63.9 - 53.9 %   MCV 90.2 80.0 - 100.0 fL   MCH 28.6 26.0 - 34.0 pg   MCHC 31.7 30.0 - 36.0 g/dL   RDW 82.9 (H) 88.4 - 84.4 %   Platelets 99 (L) 150 - 400 K/uL    Comment: Immature Platelet Fraction may be clinically indicated, consider ordering this additional test [id number]    nRBC 0.0 0.0 - 0.2 %    Comment: Performed at [facility], [address] [place], [name] [address]  Comprehensive metabolic panel     Status: Abnormal   Collection Time: 08/11/23  4:30 AM  Result Value Ref Range   Sodium 127 (L) 135 - 145 mmol/L   Potassium 4.3 3.5 - 5.1 mmol/L   Chloride 92 (L) 98 - 111 mmol/L   CO2 26 22 - 32 mmol/L    Glucose, Bld 98 70 - 99 mg/dL    Comment: Glucose reference range applies only to samples taken after fasting for at least 8 hours.   BUN 14 6 - 20 mg/dL   Creatinine, Ser 9.64 (L) 0.44 - 1.00 mg/dL   Calcium  8.2 (L) 8.9 - 10.3 mg/dL   Total Protein 5.4 (L) 6.5 - 8.1 g/dL   Albumin 1.8 (L) 3.5 - 5.0 g/dL   AST 48 (H) 15 - 41 U/L   ALT 19 0 - 44 U/L   Alkaline Phosphatase 70 38 - 126 U/L   Total Bilirubin 1.0 0.0 - 1.2 mg/dL   GFR, Estimated >39 >39 mL/min    Comment: (NOTE) Calculated using the CKD-EPI Creatinine Equation (2021)    Anion gap 9 5 - 15    Comment: Performed at [facility], [address], [place], [name] [address]  Phosphorus     Status: None   Collection Time: 08/11/23  4:30 AM  Result Value Ref Range   Phosphorus 3.9 2.5 - 4.6 mg/dL    Comment: Performed at [facility], [address], [place], [name] [address]  Glucose, capillary     Status: Abnormal   Collection Time: 08/11/23  7:44 AM  Result Value Ref Range   Glucose-Capillary 115 (H) 70 - 99 mg/dL    Comment: Glucose reference range applies only to samples taken after fasting for at least 8 hours.  Glucose, capillary     Status: Abnormal   Collection Time: 08/11/23  4:17 PM  Result Value Ref Range   Glucose-Capillary 101 (H) 70 - 99 mg/dL    Comment: Glucose reference range applies only to samples taken after fasting for at least 8 hours.  Glucose, capillary     Status: Abnormal   Collection Time: 08/11/23 11:53 PM  Result Value Ref Range   Glucose-Capillary 113 (H) 70 - 99 mg/dL    Comment: Glucose reference range applies only to samples taken after fasting for at least 8 hours.  Magnesium      Status: None   Collection Time: 08/12/23  4:48 AM  Result Value Ref Range   Magnesium  1.9 1.7 - 2.4 mg/dL    Comment: Performed at [facility], [address], [place], [name] [address]  CBC     Status: Abnormal   Collection Time: 08/12/23  4:48 AM  Result Value Ref Range   WBC 6.9 4.0 - 10.5 K/uL   RBC  3.31 (L) 3.87 - 5.11 MIL/uL   Hemoglobin 9.5 (L) 12.0 - 15.0 g/dL   HCT 70.2 (L) 63.9 - 53.9 %   MCV 89.7 80.0 - 100.0 fL   MCH 28.7 26.0 - 34.0 pg   MCHC 32.0 30.0 - 36.0 g/dL   RDW 83.0 (H) 88.4 - 84.4 %   Platelets 113 (L) 150 - 400 K/uL   nRBC 0.0 0.0 - 0.2 %    Comment: Performed at [facility], [address], [place], [name] [address]  Comprehensive metabolic panel     Status: Abnormal   Collection Time: 08/12/23  4:48 AM  Result Value Ref Range   Sodium 124 (L) 135 - 145 mmol/L   Potassium 4.9 3.5 - 5.1 mmol/L   Chloride 94 (L) 98 - 111 mmol/L   CO2 24 22 - 32 mmol/L   Glucose, Bld 104 (H) 70 - 99 mg/dL    Comment: Glucose reference range applies only to samples taken after fasting for at least 8 hours.   BUN 17 6 - 20 mg/dL   Creatinine, Ser 9.68 (L) 0.44 - 1.00 mg/dL   Calcium  8.2 (L) 8.9 - 10.3 mg/dL   Total Protein 5.4 (L) 6.5 - 8.1 g/dL   Albumin 1.8 (L) 3.5 - 5.0 g/dL   AST 66 (H) 15 - 41 U/L   ALT 28 0 - 44 U/L   Alkaline Phosphatase 83 38 - 126 U/L   Total Bilirubin 1.1 0.0 - 1.2 mg/dL   GFR, Estimated >39 >39 mL/min    Comment: (NOTE) Calculated using the CKD-EPI Creatinine Equation (2021)    Anion gap 6 5 - 15    Comment: Performed at [facility], [address] [place], [name] [address]  Glucose, capillary     Status: None   Collection Time: 08/12/23  7:37 AM  Result Value Ref Range   Glucose-Capillary 83 70 - 99 mg/dL    Comment: Glucose reference range applies only to samples taken after fasting for at least 8 hours.  Glucose, capillary     Status: None   Collection Time: 08/12/23 11:59 AM  Result Value Ref Range  Glucose-Capillary 81 70 - 99 mg/dL    Comment: Glucose reference range applies only to samples taken after fasting for at least 8 hours.  Glucose, capillary     Status: None   Collection Time: 08/12/23  4:36 PM  Result Value Ref Range   Glucose-Capillary 77 70 - 99 mg/dL    Comment: Glucose reference range applies only to samples  taken after fasting for at least 8 hours.  Magnesium      Status: Abnormal   Collection Time: 08/13/23  3:40 AM  Result Value Ref Range   Magnesium  1.6 (L) 1.7 - 2.4 mg/dL    Comment: Performed at [facility], [address] [place], [name] [address]  CBC     Status: Abnormal   Collection Time: 08/13/23  3:40 AM  Result Value Ref Range   WBC 8.2 4.0 - 10.5 K/uL   RBC 3.12 (L) 3.87 - 5.11 MIL/uL   Hemoglobin 9.2 (L) 12.0 - 15.0 g/dL   HCT 72.2 (L) 63.9 - 53.9 %   MCV 88.8 80.0 - 100.0 fL   MCH 29.5 26.0 - 34.0 pg   MCHC 33.2 30.0 - 36.0 g/dL   RDW 82.8 (H) 88.4 - 84.4 %   Platelets 134 (L) 150 - 400 K/uL   nRBC 0.0 0.0 - 0.2 %    Comment: Performed at [facility], [address], [place], [name] [address]  Comprehensive metabolic panel     Status: Abnormal   Collection Time: 08/13/23  3:40 AM  Result Value Ref Range   Sodium 125 (L) 135 - 145 mmol/L   Potassium 4.1 3.5 - 5.1 mmol/L   Chloride 94 (L) 98 - 111 mmol/L   CO2 23 22 - 32 mmol/L   Glucose, Bld 80 70 - 99 mg/dL    Comment: Glucose reference range applies only to samples taken after fasting for at least 8 hours.   BUN 13 6 - 20 mg/dL   Creatinine, Ser 9.67 (L) 0.44 - 1.00 mg/dL   Calcium  7.9 (L) 8.9 - 10.3 mg/dL   Total Protein 5.5 (L) 6.5 - 8.1 g/dL   Albumin 1.8 (L) 3.5 - 5.0 g/dL   AST 74 (H) 15 - 41 U/L   ALT 34 0 - 44 U/L   Alkaline Phosphatase 87 38 - 126 U/L   Total Bilirubin 1.7 (H) 0.0 - 1.2 mg/dL   GFR, Estimated >39 >39 mL/min    Comment: (NOTE) Calculated using the CKD-EPI Creatinine Equation (2021)    Anion gap 8 5 - 15    Comment: Performed at [facility], [address], [place], [name] [address]  Phosphorus     Status: None   Collection Time: 08/13/23  3:40 AM  Result Value Ref Range   Phosphorus 4.6 2.5 - 4.6 mg/dL    Comment: Performed at [facility], [address], [place], [name] [address]  Glucose, capillary     Status: None   Collection Time: 08/13/23  7:19 AM  Result Value Ref  Range   Glucose-Capillary 83 70 - 99 mg/dL    Comment: Glucose reference range applies only to samples taken after fasting for at least 8 hours.  Glucose, capillary     Status: None   Collection Time: 08/13/23 11:30 AM  Result Value Ref Range   Glucose-Capillary 82 70 - 99 mg/dL    Comment: Glucose reference range applies only to samples taken after fasting for at least 8 hours.  Protime-INR     Status:  Abnormal   Collection Time: 08/13/23 11:38 AM  Result Value Ref Range   Prothrombin Time 17.9 (H) 11.4 - 15.2 seconds   INR 1.4 (H) 0.8 - 1.2    Comment: (NOTE) INR goal varies based on device and disease states. Performed at [facility], [address], [place], [name] [address]   Urinalysis, Routine w reflex microscopic -Urine, Clean Catch     Status: Abnormal   Collection Time: 08/13/23  3:40 PM  Result Value Ref Range   Color, Urine YELLOW YELLOW   APPearance CLEAR CLEAR   Specific Gravity, Urine 1.036 (H) 1.005 - 1.030   pH 5.0 5.0 - 8.0   Glucose, UA NEGATIVE NEGATIVE mg/dL   Hgb urine dipstick SMALL (A) NEGATIVE   Bilirubin Urine NEGATIVE NEGATIVE   Ketones, ur NEGATIVE NEGATIVE mg/dL   Protein, ur NEGATIVE NEGATIVE mg/dL   Nitrite NEGATIVE NEGATIVE   Leukocytes,Ua NEGATIVE NEGATIVE   RBC / HPF 0-5 0 - 5 RBC/hpf   WBC, UA 0-5 0 - 5 WBC/hpf   Bacteria, UA NONE SEEN NONE SEEN   Squamous Epithelial / HPF 0-5 0 - 5 /HPF   Mucus PRESENT     Comment: Performed at [facility], [address], [place], [name] [address]  Glucose, capillary     Status: None   Collection Time: 08/13/23  5:08 PM  Result Value Ref Range   Glucose-Capillary 83 70 - 99 mg/dL    Comment: Glucose reference range applies only to samples taken after fasting for at least 8 hours.  Magnesium      Status: None   Collection Time: 08/14/23  4:36 AM  Result Value Ref Range   Magnesium  1.9 1.7 - 2.4 mg/dL    Comment: Performed at [facility] [address], [place], [name] [address]  Comprehensive  metabolic panel     Status: Abnormal   Collection Time: 08/14/23  4:36 AM  Result Value Ref Range   Sodium 128 (L) 135 - 145 mmol/L   Potassium 3.6 3.5 - 5.1 mmol/L   Chloride 92 (L) 98 - 111 mmol/L   CO2 26 22 - 32 mmol/L   Glucose, Bld 85 70 - 99 mg/dL    Comment: Glucose reference range applies only to samples taken after fasting for at least 8 hours.   BUN 13 6 - 20 mg/dL   Creatinine, Ser 9.60 (L) 0.44 - 1.00 mg/dL   Calcium  8.0 (L) 8.9 - 10.3 mg/dL   Total Protein 5.8 (L) 6.5 - 8.1 g/dL   Albumin 1.9 (L) 3.5 - 5.0 g/dL   AST 79 (H) 15 - 41 U/L   ALT 39 0 - 44 U/L   Alkaline Phosphatase 92 38 - 126 U/L   Total Bilirubin 1.5 (H) 0.0 - 1.2 mg/dL   GFR, Estimated >39 >39 mL/min    Comment: (NOTE) Calculated using the CKD-EPI Creatinine Equation (2021)    Anion gap 10 5 - 15    Comment: Performed at [facility], [address] [place], [name] [address]  Protime-INR     Status: Abnormal   Collection Time: 08/14/23  4:36 AM  Result Value Ref Range   Prothrombin Time 18.4 (H) 11.4 - 15.2 seconds   INR 1.5 (H) 0.8 - 1.2    Comment: (NOTE) INR goal varies based on device and disease states. Performed at [facility], [address] [place], [name] [address]   Glucose, capillary     Status: Abnormal   Collection Time: 08/14/23  7:06 AM  Result Value Ref Range   Glucose-Capillary 107 (H) 70 - 99 mg/dL    Comment: Glucose reference range applies only to samples taken after fasting for at least 8 hours.  CBC     Status: Abnormal   Collection Time: 08/14/23  9:49 AM  Result Value Ref Range   WBC 7.2 4.0 - 10.5 K/uL   RBC 3.15 (L) 3.87 - 5.11 MIL/uL   Hemoglobin 9.0 (L) 12.0 - 15.0 g/dL   HCT 72.0 (L) 63.9 - 53.9 %   MCV 88.6 80.0 - 100.0 fL   MCH 28.6 26.0 - 34.0 pg   MCHC 32.3 30.0 - 36.0 g/dL   RDW 83.4 (H) 88.4 - 84.4 %   Platelets 153 150 - 400 K/uL   nRBC 0.0 0.0 - 0.2 %    Comment: Performed at [facility], [address] [place], [name] [address]  Glucose,  capillary     Status: Abnormal   Collection Time: 08/14/23 11:19 AM  Result Value Ref Range   Glucose-Capillary 101 (H) 70 - 99 mg/dL    Comment: Glucose reference range applies only to samples taken after fasting for at least 8 hours.  CBC with Differential     Status: Abnormal   Collection Time: 08/19/23 10:19 PM  Result Value Ref Range   WBC 5.6 4.0 - 10.5 K/uL   RBC 3.62 (L) 3.87 - 5.11 MIL/uL   Hemoglobin 10.7 (L) 12.0 - 15.0 g/dL   HCT 68.0 (L) 63.9 - 53.9 %   MCV 88.1 80.0 - 100.0 fL   MCH 29.6 26.0 - 34.0 pg   MCHC 33.5 30.0 - 36.0 g/dL   RDW 82.8 (H) 88.4 - 84.4 %   Platelets 330 150 - 400 K/uL   nRBC 0.0 0.0 - 0.2 %   Neutrophils Relative % 61 %   Neutro Abs 3.4 1.7 - 7.7 K/uL   Lymphocytes Relative 22 %   Lymphs Abs 1.2 0.7 - 4.0 K/uL   Monocytes Relative 14 %   Monocytes Absolute 0.8 0.1 - 1.0 K/uL   Eosinophils Relative 1 %   Eosinophils Absolute 0.0 0.0 - 0.5 K/uL   Basophils Relative 1 %   Basophils Absolute 0.1 0.0 - 0.1 K/uL   Immature Granulocytes 1 %   Abs Immature Granulocytes 0.05 0.00 - 0.07 K/uL    Comment: Performed at [facility], [address] [place], [name] [address]  Comprehensive metabolic panel     Status: Abnormal   Collection Time: 08/19/23 10:19 PM  Result Value Ref Range   Sodium 129 (L) 135 - 145 mmol/L   Potassium 3.5 3.5 - 5.1 mmol/L   Chloride 90 (L) 98 - 111 mmol/L   CO2 24 22 - 32 mmol/L   Glucose, Bld 84 70 - 99 mg/dL    Comment: Glucose reference range applies only to samples taken after fasting for at least 8 hours.   BUN <5 (L) 6 - 20 mg/dL   Creatinine, Ser 9.62 (L) 0.44 - 1.00 mg/dL   Calcium  8.2 (L) 8.9 - 10.3 mg/dL   Total Protein 7.3 6.5 - 8.1 g/dL   Albumin 2.6 (L) 3.5 - 5.0 g/dL   AST 897 (H) 15 - 41 U/L   ALT 44 0 - 44 U/L   Alkaline Phosphatase 95 38 - 126 U/L   Total Bilirubin 1.2 0.0 - 1.2 mg/dL   GFR, Estimated >39 >39 mL/min    Comment: (NOTE) Calculated using the CKD-EPI Creatinine  Equation (2021)     Anion gap 15 5 - 15    Comment: Performed at [facility], [address], [place], [name] [address]  Magnesium      Status: None   Collection Time: 08/19/23 10:19 PM  Result Value Ref Range   Magnesium  1.7 1.7 - 2.4 mg/dL    Comment: Performed at [facility], [address] [place], [name] [address]  Lactic acid, plasma     Status: None   Collection Time: 08/19/23 10:28 PM  Result Value Ref Range   Lactic Acid, Venous 1.8 0.5 - 1.9 mmol/L    Comment: Performed at [facility], [address], [place], [name] [address]  Culture, blood (routine x 2)     Status: None   Collection Time: 08/19/23 10:28 PM   Specimen: Right Antecubital; Blood  Result Value Ref Range   Specimen Description RIGHT ANTECUBITAL    Special Requests      BOTTLES DRAWN AEROBIC AND ANAEROBIC Blood Culture adequate volume   Culture      NO GROWTH 5 DAYS Performed at [facility], [address] [place], [name] [address]    Report Status 08/24/2023 FINAL   Urinalysis, Routine w reflex microscopic -Urine, Clean Catch     Status: Abnormal   Collection Time: 08/19/23 10:40 PM  Result Value Ref Range   Color, Urine YELLOW YELLOW   APPearance CLEAR CLEAR   Specific Gravity, Urine 1.002 (L) 1.005 - 1.030   pH 6.0 5.0 - 8.0   Glucose, UA NEGATIVE NEGATIVE mg/dL   Hgb urine dipstick SMALL (A) NEGATIVE   Bilirubin Urine NEGATIVE NEGATIVE   Ketones, ur 5 (A) NEGATIVE mg/dL   Protein, ur NEGATIVE NEGATIVE mg/dL   Nitrite NEGATIVE NEGATIVE   Leukocytes,Ua NEGATIVE NEGATIVE   RBC / HPF 0-5 0 - 5 RBC/hpf   WBC, UA 0-5 0 - 5 WBC/hpf   Bacteria, UA RARE (A) NONE SEEN   Squamous Epithelial / HPF 0-5 0 - 5 /HPF    Comment: Performed at [facility], [address] [place], [name] [address]  Lipase, blood     Status: Abnormal   Collection Time: 08/19/23 11:08 PM  Result Value Ref Range   Lipase 103 (H) 11 - 51 U/L    Comment: Performed at [facility], [address], [place], [name] [address]  Culture, blood  (routine x 2)     Status: None   Collection Time: 08/19/23 11:08 PM   Specimen: Left Antecubital; Blood  Result Value Ref Range   Specimen Description LEFT ANTECUBITAL    Special Requests      BOTTLES DRAWN AEROBIC ONLY Blood Culture adequate volume   Culture      NO GROWTH 5 DAYS Performed at [facility], [address], [place], [name] [address]    Report Status 08/24/2023 FINAL   C Difficile Quick Screen w PCR reflex     Status: Abnormal   Collection Time: 08/20/23  2:15 PM   Specimen: STOOL  Result Value Ref Range   C Diff antigen POSITIVE (A) NEGATIVE   C Diff toxin NEGATIVE NEGATIVE   C Diff interpretation Results are indeterminate. See PCR results.     Comment: Performed at [facility], [address] [place], [name] [address]  C. Diff by PCR, Reflexed     Status: None   Collection Time: 08/20/23  2:15 PM  Result Value Ref Range   Toxigenic C. Difficile by PCR NEGATIVE NEGATIVE    Comment: Patient  is colonized with non toxigenic C. difficile. May not need treatment unless significant symptoms are present.   Hypervirulent Strain PRESUMPTIVE NEGATIVE PRESUMPTIVE NEGATIVE    Comment: Performed at [facility], [address] [place], [name] [address]  CBC with Differential/Platelet     Status: Abnormal   Collection Time: 08/20/23  3:04 PM  Result Value Ref Range   WBC 5.5 4.0 - 10.5 K/uL   RBC 3.26 (L) 3.87 - 5.11 MIL/uL   Hemoglobin 9.7 (L) 12.0 - 15.0 g/dL   HCT 70.3 (L) 63.9 - 53.9 %   MCV 90.8 80.0 - 100.0 fL   MCH 29.8 26.0 - 34.0 pg   MCHC 32.8 30.0 - 36.0 g/dL   RDW 82.5 (H) 88.4 - 84.4 %   Platelets 147 (L) 150 - 400 K/uL   nRBC 0.0 0.0 - 0.2 %   Neutrophils Relative % 73 %   Neutro Abs 4.0 1.7 - 7.7 K/uL   Lymphocytes Relative 13 %   Lymphs Abs 0.7 0.7 - 4.0 K/uL   Monocytes Relative 9 %   Monocytes Absolute 0.5 0.1 - 1.0 K/uL   Eosinophils Relative 2 %   Eosinophils Absolute 0.1 0.0 - 0.5 K/uL   Basophils Relative 1 %   Basophils Absolute 0.0  0.0 - 0.1 K/uL   Immature Granulocytes 2 %   Abs Immature Granulocytes 0.11 (H) 0.00 - 0.07 K/uL    Comment: Performed at [facility], [address], [place], [name] [address]  Comprehensive metabolic panel     Status: Abnormal   Collection Time: 08/20/23  3:04 PM  Result Value Ref Range   Sodium 129 (L) 135 - 145 mmol/L   Potassium 3.8 3.5 - 5.1 mmol/L   Chloride 93 (L) 98 - 111 mmol/L   CO2 23 22 - 32 mmol/L   Glucose, Bld 52 (L) 70 - 99 mg/dL    Comment: Glucose reference range applies only to samples taken after fasting for at least 8 hours.   BUN <5 (L) 6 - 20 mg/dL   Creatinine, Ser 9.64 (L) 0.44 - 1.00 mg/dL   Calcium  8.0 (L) 8.9 - 10.3 mg/dL   Total Protein 6.5 6.5 - 8.1 g/dL   Albumin 2.3 (L) 3.5 - 5.0 g/dL   AST 896 (H) 15 - 41 U/L   ALT 40 0 - 44 U/L   Alkaline Phosphatase 89 38 - 126 U/L   Total Bilirubin 1.5 (H) 0.0 - 1.2 mg/dL   GFR, Estimated >39 >39 mL/min    Comment: (NOTE) Calculated using the CKD-EPI Creatinine Equation (2021)    Anion gap 13 5 - 15    Comment: Performed at [facility], [address], [place], [name] [address]  Protime-INR     Status: Abnormal   Collection Time: 08/20/23  3:04 PM  Result Value Ref Range   Prothrombin Time 16.6 (H) 11.4 - 15.2 seconds   INR 1.3 (H) 0.8 - 1.2    Comment: (NOTE) INR goal varies based on device and disease states. Performed at [facility], [address] [place], [name] [address]   Lipase, blood     Status: Abnormal   Collection Time: 08/20/23  3:04 PM  Result Value Ref Range   Lipase 61 (H) 11 - 51 U/L    Comment: Performed at [facility], [address] [place], [name] [address]  CBC     Status: Abnormal   Collection Time: 08/21/23 11:08 AM  Result Value Ref Range  WBC 3.5 (L) 4.0 - 10.5 K/uL   RBC 3.43 (L) 3.87 - 5.11 MIL/uL   Hemoglobin 10.1 (L) 12.0 - 15.0 g/dL   HCT 68.4 (L) 63.9 - 53.9 %   MCV 91.8 80.0 - 100.0 fL   MCH 29.4 26.0 - 34.0 pg   MCHC 32.1 30.0 - 36.0 g/dL   RDW 82.4 (H)  88.4 - 15.5 %   Platelets 275 150 - 400 K/uL   nRBC 0.0 0.0 - 0.2 %    Comment: Performed at [facility], [address], [place], [name] [address]  Comprehensive metabolic panel     Status: Abnormal   Collection Time: 08/21/23 11:08 AM  Result Value Ref Range   Sodium 131 (L) 135 - 145 mmol/L   Potassium 3.8 3.5 - 5.1 mmol/L   Chloride 91 (L) 98 - 111 mmol/L   CO2 26 22 - 32 mmol/L   Glucose, Bld 110 (H) 70 - 99 mg/dL    Comment: Glucose reference range applies only to samples taken after fasting for at least 8 hours.   BUN <5 (L) 6 - 20 mg/dL   Creatinine, Ser 9.55 0.44 - 1.00 mg/dL   Calcium  8.2 (L) 8.9 - 10.3 mg/dL   Total Protein 6.8 6.5 - 8.1 g/dL   Albumin 2.4 (L) 3.5 - 5.0 g/dL   AST 96 (H) 15 - 41 U/L   ALT 37 0 - 44 U/L   Alkaline Phosphatase 84 38 - 126 U/L   Total Bilirubin 1.6 (H) 0.0 - 1.2 mg/dL   GFR, Estimated >39 >39 mL/min    Comment: (NOTE) Calculated using the CKD-EPI Creatinine Equation (2021)    Anion gap 14 5 - 15    Comment: Performed at [facility], [address], [place], [name] [address]  Protime-INR     Status: Abnormal   Collection Time: 08/21/23 11:08 AM  Result Value Ref Range   Prothrombin Time 17.3 (H) 11.4 - 15.2 seconds   INR 1.3 (H) 0.8 - 1.2    Comment: (NOTE) INR goal varies based on device and disease states. Performed at [facility], [address] [place], [name] [address]   CBC     Status: Abnormal   Collection Time: 08/22/23  5:19 AM  Result Value Ref Range   WBC 2.8 (L) 4.0 - 10.5 K/uL   RBC 2.91 (L) 3.87 - 5.11 MIL/uL   Hemoglobin 8.6 (L) 12.0 - 15.0 g/dL   HCT 73.8 (L) 63.9 - 53.9 %   MCV 89.7 80.0 - 100.0 fL   MCH 29.6 26.0 - 34.0 pg   MCHC 33.0 30.0 - 36.0 g/dL   RDW 82.5 (H) 88.4 - 84.4 %   Platelets 214 150 - 400 K/uL   nRBC 0.0 0.0 - 0.2 %    Comment: Performed at [facility], [address], [place], [name] [address]  Comprehensive metabolic panel with GFR     Status: Abnormal   Collection Time: 08/22/23   5:19 AM  Result Value Ref Range   Sodium 131 (L) 135 - 145 mmol/L   Potassium 2.8 (L) 3.5 - 5.1 mmol/L   Chloride 95 (L) 98 - 111 mmol/L   CO2 27 22 - 32 mmol/L   Glucose, Bld 91 70 - 99 mg/dL    Comment: Glucose reference range applies only to samples taken after fasting for at least 8 hours.   BUN <5 (L) 6 - 20 mg/dL   Creatinine, Ser <9.69 (L) 0.44 - 1.00  mg/dL   Calcium  7.7 (L) 8.9 - 10.3 mg/dL   Total Protein 5.4 (L) 6.5 - 8.1 g/dL   Albumin 1.9 (L) 3.5 - 5.0 g/dL   AST 78 (H) 15 - 41 U/L   ALT 32 0 - 44 U/L   Alkaline Phosphatase 66 38 - 126 U/L   Total Bilirubin 1.0 0.0 - 1.2 mg/dL   GFR, Estimated NOT CALCULATED >60 mL/min    Comment: (NOTE) Calculated using the CKD-EPI Creatinine Equation (2021)    Anion gap 9 5 - 15    Comment: Performed at [facility], [address], [place], [name] [address]  CBC with Differential     Status: Abnormal   Collection Time: 08/31/23  9:52 PM  Result Value Ref Range   WBC 5.9 4.0 - 10.5 K/uL   RBC 3.43 (L) 3.87 - 5.11 MIL/uL   Hemoglobin 10.1 (L) 12.0 - 15.0 g/dL   HCT 69.6 (L) 63.9 - 53.9 %   MCV 88.3 80.0 - 100.0 fL   MCH 29.4 26.0 - 34.0 pg   MCHC 33.3 30.0 - 36.0 g/dL   RDW 81.8 (H) 88.4 - 84.4 %   Platelets 148 (L) 150 - 400 K/uL   nRBC 0.0 0.0 - 0.2 %   Neutrophils Relative % 66 %   Neutro Abs 3.8 1.7 - 7.7 K/uL   Lymphocytes Relative 21 %   Lymphs Abs 1.3 0.7 - 4.0 K/uL   Monocytes Relative 12 %   Monocytes Absolute 0.7 0.1 - 1.0 K/uL   Eosinophils Relative 0 %   Eosinophils Absolute 0.0 0.0 - 0.5 K/uL   Basophils Relative 0 %   Basophils Absolute 0.0 0.0 - 0.1 K/uL   Immature Granulocytes 1 %   Abs Immature Granulocytes 0.04 0.00 - 0.07 K/uL    Comment: Performed at [facility], [address], [place], [name] [address]  Comprehensive metabolic panel     Status: Abnormal   Collection Time: 08/31/23  9:52 PM  Result Value Ref Range   Sodium 125 (L) 135 - 145 mmol/L   Potassium 2.6 (LL) 3.5 - 5.1 mmol/L     Comment: CRITICAL RESULT CALLED TO, READ BACK BY AND VERIFIED WITH IVKOV,E ON 8/22 AT 2228 BY PURDIE,J   Chloride 86 (L) 98 - 111 mmol/L   CO2 24 22 - 32 mmol/L   Glucose, Bld 86 70 - 99 mg/dL    Comment: Glucose reference range applies only to samples taken after fasting for at least 8 hours.   BUN <5 (L) 6 - 20 mg/dL   Creatinine, Ser 9.60 (L) 0.44 - 1.00 mg/dL   Calcium  8.0 (L) 8.9 - 10.3 mg/dL   Total Protein 7.2 6.5 - 8.1 g/dL   Albumin 2.5 (L) 3.5 - 5.0 g/dL   AST 56 (H) 15 - 41 U/L   ALT 22 0 - 44 U/L   Alkaline Phosphatase 76 38 - 126 U/L   Total Bilirubin 1.5 (H) 0.0 - 1.2 mg/dL   GFR, Estimated >39 >39 mL/min    Comment: (NOTE) Calculated using the CKD-EPI Creatinine Equation (2021)    Anion gap 15 5 - 15    Comment: Performed at [facility], [address] [place], [name] [address]  Lipase, blood     Status: Abnormal   Collection Time: 08/31/23  9:52 PM  Result Value Ref Range   Lipase 82 (H) 11 - 51 U/L    Comment: Performed at [facility], [address], [place],  [name] [address]  Magnesium      Status: Abnormal   Collection Time: 08/31/23  9:52 PM  Result Value Ref Range   Magnesium  1.6 (L) 1.7 - 2.4 mg/dL    Comment: Performed at [facility], [address], [place], [name] [address]  Lactic acid, plasma     Status: Abnormal   Collection Time: 08/31/23 10:00 PM  Result Value Ref Range   Lactic Acid, Venous 2.3 (HH) 0.5 - 1.9 mmol/L    Comment: CRITICAL RESULT CALLED TO, READ BACK BY AND VERIFIED WITH IVKOV,E ON 8/22 AT 2228 BY PURDIE,J Performed at [facility], [address] [place], [name] [address]   Type and screen     Status: None   Collection Time: 08/31/23 10:12 PM  Result Value Ref Range   ABO/RH(D) A POS    Antibody Screen NEG    Sample Expiration      09/03/2023,2359 Performed at [facility], [address], [place], [name] [address]   Ethanol     Status: Abnormal   Collection Time: 08/31/23 10:12 PM  Result Value Ref Range   Alcohol,  Ethyl (B) 139 (H) <15 mg/dL    Comment: (NOTE) For medical purposes only. Performed at [facility], [address] [place], [name] [address]   POC occult blood, ED     Status: Abnormal   Collection Time: 08/31/23 10:18 PM  Result Value Ref Range   Fecal Occult Blood Positive (A)   Urinalysis, Routine w reflex microscopic -Urine, Clean Catch     Status: Abnormal   Collection Time: 08/31/23 10:26 PM  Result Value Ref Range   Color, Urine STRAW (A) YELLOW   APPearance CLEAR CLEAR   Specific Gravity, Urine 1.001 (L) 1.005 - 1.030   pH 7.0 5.0 - 8.0   Glucose, UA NEGATIVE NEGATIVE mg/dL   Hgb urine dipstick NEGATIVE NEGATIVE   Bilirubin Urine NEGATIVE NEGATIVE   Ketones, ur NEGATIVE NEGATIVE mg/dL   Protein, ur NEGATIVE NEGATIVE mg/dL   Nitrite NEGATIVE NEGATIVE   Leukocytes,Ua NEGATIVE NEGATIVE    Comment: Performed at [facility], [address] [place], [name] [address]  Osmolality, urine     Status: Abnormal   Collection Time: 08/31/23 10:26 PM  Result Value Ref Range   Osmolality, Ur 69 (L) 300 - 900 mOsm/kg    Comment: Performed at [facility], [address] [place], [name] [address]  Sodium, urine, random     Status: None   Collection Time: 08/31/23 10:26 PM  Result Value Ref Range   Sodium, Ur <10 mmol/L    Comment: Performed at [facility], [address] [place], [name] [address]  I-stat chem 8, ED (not at [facility] or [facility])     Status: Abnormal   Collection Time: 08/31/23 10:40 PM  Result Value Ref Range   Sodium 128 (L) 135 - 145 mmol/L   Potassium 2.5 (LL) 3.5 - 5.1 mmol/L   Chloride 87 (L) 98 - 111 mmol/L   BUN <3 (L) 6 - 20 mg/dL   Creatinine, Ser 9.39 0.44 - 1.00 mg/dL   Glucose, Bld 81 70 - 99 mg/dL    Comment: Glucose reference range applies only to samples taken after fasting for at least 8 hours.   Calcium , Ion 0.95 (L) 1.15 - 1.40 mmol/L   TCO2 26 22 - 32 mmol/L   Hemoglobin 10.2 (L) 12.0 - 15.0 g/dL   HCT 69.9 (L) 63.9 - 53.9 %  Lactic  acid, plasma  Status: None   Collection Time: 09/01/23 12:47 AM  Result Value Ref Range   Lactic Acid, Venous 1.9 0.5 - 1.9 mmol/L    Comment: Performed at [facility], [address] [place], [name] [address]  Potassium     Status: Abnormal   Collection Time: 09/01/23 12:47 AM  Result Value Ref Range   Potassium 2.9 (L) 3.5 - 5.1 mmol/L    Comment: Performed at [facility], [address], [place], [name] [address]  Magnesium      Status: None   Collection Time: 09/01/23  6:00 AM  Result Value Ref Range   Magnesium  1.8 1.7 - 2.4 mg/dL    Comment: Performed at [facility], [address] [place], [name] [address]  Comprehensive metabolic panel     Status: Abnormal   Collection Time: 09/01/23  6:00 AM  Result Value Ref Range   Sodium 130 (L) 135 - 145 mmol/L   Potassium 2.9 (L) 3.5 - 5.1 mmol/L   Chloride 93 (L) 98 - 111 mmol/L   CO2 26 22 - 32 mmol/L   Glucose, Bld 74 70 - 99 mg/dL    Comment: Glucose reference range applies only to samples taken after fasting for at least 8 hours.   BUN <5 (L) 6 - 20 mg/dL   Creatinine, Ser 9.63 (L) 0.44 - 1.00 mg/dL   Calcium  7.4 (L) 8.9 - 10.3 mg/dL   Total Protein 6.0 (L) 6.5 - 8.1 g/dL   Albumin 2.1 (L) 3.5 - 5.0 g/dL   AST 43 (H) 15 - 41 U/L   ALT 18 0 - 44 U/L   Alkaline Phosphatase 63 38 - 126 U/L   Total Bilirubin 1.4 (H) 0.0 - 1.2 mg/dL   GFR, Estimated >39 >39 mL/min    Comment: (NOTE) Calculated using the CKD-EPI Creatinine Equation (2021)    Anion gap 11 5 - 15    Comment: Performed at [facility], [address] [place], [name] [address]  CBC     Status: Abnormal   Collection Time: 09/01/23  6:00 AM  Result Value Ref Range   WBC 4.4 4.0 - 10.5 K/uL   RBC 3.00 (L) 3.87 - 5.11 MIL/uL   Hemoglobin 8.9 (L) 12.0 - 15.0 g/dL   HCT 73.1 (L) 63.9 - 53.9 %   MCV 89.3 80.0 - 100.0 fL   MCH 29.7 26.0 - 34.0 pg   MCHC 33.2 30.0 - 36.0 g/dL   RDW 81.4 (H) 88.4 - 84.4 %   Platelets 129 (L) 150 - 400 K/uL   nRBC 0.0 0.0 -  0.2 %    Comment: Performed at [facility], [address] [place], [name] [address]  Osmolality     Status: None   Collection Time: 09/01/23  6:00 AM  Result Value Ref Range   Osmolality 279 275 - 295 mOsm/kg    Comment: Performed at [facility], [address] [place], [name] [address]  CBC     Status: Abnormal   Collection Time: 09/01/23  3:43 PM  Result Value Ref Range   WBC 5.9 4.0 - 10.5 K/uL   RBC 3.24 (L) 3.87 - 5.11 MIL/uL   Hemoglobin 9.7 (L) 12.0 - 15.0 g/dL   HCT 70.3 (L) 63.9 - 53.9 %   MCV 91.4 80.0 - 100.0 fL   MCH 29.9 26.0 - 34.0 pg   MCHC 32.8 30.0 - 36.0 g/dL   RDW 80.7 (H) 88.4 - 84.4 %   Platelets 125 (  L) 150 - 400 K/uL   nRBC 0.0 0.0 - 0.2 %    Comment: Performed at [facility], [address] [place], [name] [address]  Basic metabolic panel     Status: Abnormal   Collection Time: 09/01/23  3:43 PM  Result Value Ref Range   Sodium 128 (L) 135 - 145 mmol/L   Potassium 4.8 3.5 - 5.1 mmol/L   Chloride 95 (L) 98 - 111 mmol/L   CO2 23 22 - 32 mmol/L   Glucose, Bld 77 70 - 99 mg/dL    Comment: Glucose reference range applies only to samples taken after fasting for at least 8 hours.   BUN <5 (L) 6 - 20 mg/dL   Creatinine, Ser 9.57 (L) 0.44 - 1.00 mg/dL   Calcium  7.7 (L) 8.9 - 10.3 mg/dL   GFR, Estimated >39 >39 mL/min    Comment: (NOTE) Calculated using the CKD-EPI Creatinine Equation (2021)    Anion gap 10 5 - 15    Comment: Performed at [facility], [address], [place], [name] [address]  CBC     Status: Abnormal   Collection Time: 09/02/23  4:48 AM  Result Value Ref Range   WBC 4.2 4.0 - 10.5 K/uL   RBC 2.91 (L) 3.87 - 5.11 MIL/uL   Hemoglobin 8.8 (L) 12.0 - 15.0 g/dL   HCT 73.3 (L) 63.9 - 53.9 %   MCV 91.4 80.0 - 100.0 fL   MCH 30.2 26.0 - 34.0 pg   MCHC 33.1 30.0 - 36.0 g/dL   RDW 80.7 (H) 88.4 - 84.4 %   Platelets 121 (L) 150 - 400 K/uL   nRBC 0.0 0.0 - 0.2 %    Comment: Performed at [facility], [address],  [place], [name] [address]  Comprehensive metabolic panel     Status: Abnormal   Collection Time: 09/02/23  4:48 AM  Result Value Ref Range   Sodium 129 (L) 135 - 145 mmol/L   Potassium 3.8 3.5 - 5.1 mmol/L   Chloride 100 98 - 111 mmol/L   CO2 24 22 - 32 mmol/L   Glucose, Bld 89 70 - 99 mg/dL    Comment: Glucose reference range applies only to samples taken after fasting for at least 8 hours.   BUN <5 (L) 6 - 20 mg/dL   Creatinine, Ser 9.52 0.44 - 1.00 mg/dL   Calcium  7.6 (L) 8.9 - 10.3 mg/dL   Total Protein 5.7 (L) 6.5 - 8.1 g/dL   Albumin 1.9 (L) 3.5 - 5.0 g/dL   AST 41 15 - 41 U/L   ALT 16 0 - 44 U/L   Alkaline Phosphatase 56 38 - 126 U/L   Total Bilirubin 1.1 0.0 - 1.2 mg/dL   GFR, Estimated >39 >39 mL/min    Comment: (NOTE) Calculated using the CKD-EPI Creatinine Equation (2021)    Anion gap 5 5 - 15    Comment: Performed at [facility], [address], [place], [name] [address]  Magnesium      Status: Abnormal   Collection Time: 09/02/23  4:48 AM  Result Value Ref Range   Magnesium  1.6 (L) 1.7 - 2.4 mg/dL    Comment: Performed at [facility], [address], [place], [name] [address]  CBC     Status: Abnormal   Collection Time: 09/03/23  5:28 AM  Result Value Ref Range   WBC 4.1 4.0 - 10.5 K/uL   RBC 2.92 (L) 3.87 - 5.11 MIL/uL   Hemoglobin 8.7 (  L) 12.0 - 15.0 g/dL   HCT 72.7 (L) 63.9 - 53.9 %   MCV 93.2 80.0 - 100.0 fL   MCH 29.8 26.0 - 34.0 pg   MCHC 32.0 30.0 - 36.0 g/dL   RDW 80.5 (H) 88.4 - 84.4 %   Platelets 105 (L) 150 - 400 K/uL   nRBC 0.0 0.0 - 0.2 %    Comment: Performed at [facility], [address] [place], [name] [address]  Comprehensive metabolic panel     Status: Abnormal   Collection Time: 09/03/23  5:28 AM  Result Value Ref Range   Sodium 129 (L) 135 - 145 mmol/L   Potassium 3.3 (L) 3.5 - 5.1 mmol/L   Chloride 100 98 - 111 mmol/L   CO2 25 22 - 32 mmol/L   Glucose, Bld 95 70 - 99 mg/dL    Comment: Glucose reference range applies only to samples  taken after fasting for at least 8 hours.   BUN <5 (L) 6 - 20 mg/dL   Creatinine, Ser 9.56 (L) 0.44 - 1.00 mg/dL   Calcium  7.7 (L) 8.9 - 10.3 mg/dL   Total Protein 5.7 (L) 6.5 - 8.1 g/dL   Albumin 1.8 (L) 3.5 - 5.0 g/dL   AST 49 (H) 15 - 41 U/L   ALT 17 0 - 44 U/L   Alkaline Phosphatase 60 38 - 126 U/L   Total Bilirubin 1.0 0.0 - 1.2 mg/dL   GFR, Estimated >39 >39 mL/min    Comment: (NOTE) Calculated using the CKD-EPI Creatinine Equation (2021)    Anion gap 4 (L) 5 - 15    Comment: Performed at [facility], [address], [place], [name] [address]  Magnesium      Status: None   Collection Time: 09/03/23  5:28 AM  Result Value Ref Range   Magnesium  1.7 1.7 - 2.4 mg/dL    Comment: Performed at [facility], [address], [place], [name] [address]  Protime-INR     Status: Abnormal   Collection Time: 09/03/23  9:40 AM  Result Value Ref Range   Prothrombin Time 19.5 (H) 11.4 - 15.2 seconds   INR 1.6 (H) 0.8 - 1.2    Comment: (NOTE) INR goal varies based on device and disease states. Performed at [facility], [address] [place], [name] [address]   Cytology - Non PAP;     Status: None   Collection Time: 09/03/23  2:09 PM  Result Value Ref Range   CYTOLOGY - NON GYN      CYTOLOGY - NON PAP CASE: [id number] PATIENT: [name] Non-Gynecological Cytology Report     Clinical History: None provided Specimen Submitted:  A. ASCITES, PARACENTESIS:   FINAL MICROSCOPIC DIAGNOSIS: - No malignant cells identified  SPECIMEN ADEQUACY: Satisfactory for evaluation  GROSS: Received is/are 750cc's of bright red fluid.(ME:me) Smears: 0 Concentration Method (Thin Prep): 1 Cell Block: 1 Conventional Additional studies: N/A     Final Diagnosis performed by Pepper Dutton, MD.   Electronically signed 09/05/2023 Technical component performed at [facility], [address], [place], [name] [address].  Professional component performed at [employer], [address], [place], [name] [address].  Immunohistochemistry Technical component (if applicable) was performed at [facility]. [address], [place], [name] [address].   IMMUNOHISTOCHEMISTRY DISCLAIMER (if applicable): So me of these immunohistochemical stains may have been developed and the performance characteristics determine by [facility]. Some may not have been  cleared or approved by the U.S. Food and Drug Administration. The FDA has determined that such clearance or approval is not necessary. This test is used for clinical purposes. It should not be regarded as investigational or for research. This laboratory is certified under the Clinical Laboratory Improvement Amendments of 1988 (CLIA-88) as qualified to perform high complexity clinical laboratory testing.  The controls stained appropriately.   IHC stains are performed on formalin fixed, paraffin embedded tissue using a 3,3diaminobenzidine (DAB) chromogen and Leica Bond Autostainer System. The staining intensity of the nucleus is score manually and is reported as the percentage of tumor cell nuclei demonstrating specific nuclear staining. The specimens are fixed in 10% Neutral Formalin for at least 6 hours and up to  72hrs. These tests are validated on decalcified tissue. Results should be interpreted with caution given the possibility of false negative results on decalcified specimens. Antibody Clones are as follows ER-clone 7F, PR-clone 16, Ki67- clone MM1. Some of these immunohistochemical stains may have been developed and the performance characteristics determined by [facility].   Acid Fast Smear (AFB)     Status: None   Collection Time: 09/03/23  2:30 PM   Specimen: Paracentesis  Result Value Ref Range   AFB Specimen Processing Concentration    Acid Fast Smear Negative     Comment: (NOTE) Performed At: [facility] [address]  [place], [name] [id number] Jennette Shorter MD Ey:[phone]    Source (AFB) PERITONEAL     Comment: Performed at [facility], [address] [place], [name] [address]  Body fluid cell count with differential     Status: Abnormal   Collection Time: 09/03/23  2:30 PM  Result Value Ref Range   Fluid Type-FCT Peritoneal    Color, Fluid AMBER (A) YELLOW   Appearance, Fluid CLOUDY (A) CLEAR   Total Nucleated Cell Count, Fluid 302 0 - 1,000 cu mm   Neutrophil Count, Fluid 12 0 - 25 %   Lymphs, Fluid 63 %   Monocyte-Macrophage-Serous Fluid 25 (L) 50 - 90 %   Other Cells, Fluid RARE %    Comment: OTHER CELLS IDENTIFIED AS MESOTHELIAL CELLS Performed at [facility], [address], [place], [name] [address]   Culture, body fluid w Gram Stain-bottle     Status: None   Collection Time: 09/03/23  2:30 PM   Specimen: Peritoneal Washings  Result Value Ref Range   Specimen Description PERITONEAL BOTTLES DRAWN AEROBIC AND ANAEROBIC    Special Requests Blood Culture adequate volume    Culture      NO GROWTH 5 DAYS Performed at [facility], [address] [place], [name] [address]    Report Status 09/08/2023 FINAL   Gram stain     Status: None   Collection Time: 09/03/23  2:30 PM   Specimen: Peritoneal Washings  Result Value Ref Range   Specimen Description PERITONEAL    Special Requests PERITONEAL    Gram Stain      PLEURAL NO ORGANISMS SEEN WBC PRESENT,BOTH PMN AND MONONUCLEAR CYTOSPIN SMEAR Performed at [facility], [address] [place], [name] [address]    Report Status 09/03/2023 FINAL   CBC     Status: Abnormal   Collection Time: 09/04/23  4:30 AM  Result Value Ref Range   WBC 3.4 (L) 4.0 - 10.5 K/uL   RBC 2.89 (L) 3.87 - 5.11 MIL/uL   Hemoglobin 8.5 (L) 12.0 - 15.0 g/dL   HCT 72.8 (L) 63.9 - 53.9 %  MCV 93.8 80.0 - 100.0 fL   MCH 29.4 26.0 - 34.0 pg   MCHC 31.4 30.0 - 36.0 g/dL   RDW 80.0 (H) 88.4 - 84.4 %   Platelets 107 (L) 150 - 400 K/uL   nRBC 0.0 0.0 - 0.2 %     Comment: Performed at [facility], [address], [place], [name] [address]  Comprehensive metabolic panel     Status: Abnormal   Collection Time: 09/04/23  4:30 AM  Result Value Ref Range   Sodium 130 (L) 135 - 145 mmol/L   Potassium 3.8 3.5 - 5.1 mmol/L   Chloride 102 98 - 111 mmol/L   CO2 24 22 - 32 mmol/L   Glucose, Bld 118 (H) 70 - 99 mg/dL    Comment: Glucose reference range applies only to samples taken after fasting for at least 8 hours.   BUN <5 (L) 6 - 20 mg/dL   Creatinine, Ser 9.59 (L) 0.44 - 1.00 mg/dL   Calcium  7.7 (L) 8.9 - 10.3 mg/dL   Total Protein 5.4 (L) 6.5 - 8.1 g/dL   Albumin 1.9 (L) 3.5 - 5.0 g/dL   AST 52 (H) 15 - 41 U/L   ALT 19 0 - 44 U/L   Alkaline Phosphatase 53 38 - 126 U/L   Total Bilirubin 1.0 0.0 - 1.2 mg/dL   GFR, Estimated >39 >39 mL/min    Comment: (NOTE) Calculated using the CKD-EPI Creatinine Equation (2021)    Anion gap 4 (L) 5 - 15    Comment: Performed at [facility], [address], [place], [name] [address]  Magnesium      Status: None   Collection Time: 09/04/23  4:30 AM  Result Value Ref Range   Magnesium  1.7 1.7 - 2.4 mg/dL    Comment: Performed at [facility], [address] [place], [name] [address]  Lipase, blood     Status: None   Collection Time: 09/13/23 10:22 PM  Result Value Ref Range   Lipase 32 11 - 51 U/L    Comment: Performed at [facility], [address] [place], [name] [address]  Comprehensive metabolic panel     Status: Abnormal   Collection Time: 09/13/23 10:22 PM  Result Value Ref Range   Sodium 136 135 - 145 mmol/L   Potassium 3.8 3.5 - 5.1 mmol/L   Chloride 98 98 - 111 mmol/L   CO2 23 22 - 32 mmol/L   Glucose, Bld 84 70 - 99 mg/dL    Comment: Glucose reference range applies only to samples taken after fasting for at least 8 hours.   BUN <5 (L) 6 - 20 mg/dL   Creatinine, Ser 9.56 (L) 0.44 - 1.00 mg/dL   Calcium  8.1 (L) 8.9 - 10.3 mg/dL   Total Protein 7.5 6.5 - 8.1 g/dL   Albumin 2.3 (L) 3.5 -  5.0 g/dL   AST 85 (H) 15 - 41 U/L   ALT 22 0 - 44 U/L   Alkaline Phosphatase 85 38 - 126 U/L   Total Bilirubin 1.2 0.0 - 1.2 mg/dL   GFR, Estimated >39 >39 mL/min    Comment: (NOTE) Calculated using the CKD-EPI Creatinine Equation (2021)    Anion gap 15 5 - 15    Comment: Performed at [facility], [address], [place], [name] [address]  CBC     Status: Abnormal   Collection Time: 09/13/23 10:22 PM  Result Value Ref Range   WBC 4.5 4.0 - 10.5 K/uL  RBC 3.83 (L) 3.87 - 5.11 MIL/uL   Hemoglobin 11.4 (L) 12.0 - 15.0 g/dL   HCT 65.2 (L) 63.9 - 53.9 %   MCV 90.6 80.0 - 100.0 fL   MCH 29.8 26.0 - 34.0 pg   MCHC 32.9 30.0 - 36.0 g/dL   RDW 79.0 (H) 88.4 - 84.4 %   Platelets 125 (L) 150 - 400 K/uL   nRBC 0.0 0.0 - 0.2 %    Comment: Performed at [facility], [address] [place], [name] [address]  Urinalysis, Routine w reflex microscopic -Urine, Clean Catch     Status: Abnormal   Collection Time: 09/14/23 12:37 AM  Result Value Ref Range   Color, Urine STRAW (A) YELLOW   APPearance CLEAR CLEAR   Specific Gravity, Urine 1.010 1.005 - 1.030   pH 6.0 5.0 - 8.0   Glucose, UA NEGATIVE NEGATIVE mg/dL   Hgb urine dipstick NEGATIVE NEGATIVE   Bilirubin Urine NEGATIVE NEGATIVE   Ketones, ur NEGATIVE NEGATIVE mg/dL   Protein, ur NEGATIVE NEGATIVE mg/dL   Nitrite NEGATIVE NEGATIVE   Leukocytes,Ua NEGATIVE NEGATIVE    Comment: Performed at [facility], [address], [place], [name] [address]      Assessment & Plan:  Patient advised to return to ED due to symptoms affecting her daily health that are not resolved   Problem List Items Addressed This Visit   None   No follow-ups on file.   Total time spent: 15 minutes  Neale Carpen, NP  09/17/2023   This document may have been prepared by [facility] Voice Recognition software and as such may include unintentional dictation errors.

## 2023-09-17 NOTE — Discharge Instructions (Signed)
 Joan Wood  Thank you for allowing us  to take care of you today.  You came to the Emergency Department today because you had nausea, vomiting, and abdominal pain.  Here in the emergency department your labs and your CT was reassuring.  Your labs did show that your his potassium was low and replaced.  We are discharging you with a prescription for Zofran  for nausea, as well as 2 days of potassium pills that you should take daily.  You should follow-up with your primary care doctor this week to have your potassium levels rechecked.  To-Do: 1. Please follow-up with your primary doctor within 5 days / as soon as possible.   Please return to the Emergency Department or call 911 if you experience have worsening of your symptoms, or do not get better, chest pain, shortness of breath, severe or significantly worsening pain, high fever, severe confusion, pass out or have any reason to think that you need emergency medical care.   We hope you feel better soon.   Department of Emergency Medicine [facility]

## 2023-09-17 NOTE — ED Provider Notes (Signed)
  [place] EMERGENCY DEPARTMENT AT [facility] Provider Assume Care Note I assumed care of Joan Wood on 09/17/2023 at 3:30 PM from Dr. Charlyn.   Briefly, Joan Wood is a 53 y.o. female who: PMHx: Recent perforated gastric ulcer, mild alcohol use disorder, alcohol related liver disease P/w abdominal pain, distention, nausea, vomiting CT reassuring, labs reassuring with exception of which is low at 2.7, patient status post potassium repletion Patient currently trialing p.o.   Plan at the time of handoff: Follow-up p.o. trial, if patient able to tolerate p.o., discharge   Please refer to the original provider's note for additional information regarding the care of Joan Wood.  Reassessment: I personally reassessed the patient: Patient complained of gas, requested medication for gas, also requested that she be given another dose of Zofran  prior to discharge.   Vital Signs:  ED Triage Vitals  Encounter Vitals Group     BP 09/17/23 1015 138/82     Girls Systolic BP Percentile --      Girls Diastolic BP Percentile --      Boys Systolic BP Percentile --      Boys Diastolic BP Percentile --      Pulse Rate 09/17/23 1015 (!) 141     Resp 09/17/23 1015 18     Temp 09/17/23 1015 98.2 F (36.8 C)     Temp Source 09/17/23 1015 Oral     SpO2 09/17/23 1015 99 %     Weight 09/17/23 1012 113 lb 15.7 oz (51.7 kg)     Height 09/17/23 1012 5' 2 (1.575 m)     Head Circumference --      Peak Flow --      Pain Score 09/17/23 1012 10     Pain Loc --      Pain Education --      Exclude from Growth Chart --      Hemodynamics:  The patient is hemodynamically stable. Mental Status:  The patient is alert  Additional MDM: Well-appearing, patient able to tolerate p.o. without difficulty.  An additional dose of Zofran  and simethicone  for gas.  Will discharge with short course of Zofran  as well as 2 days of oral potassium.  Recommended close follow-up with PCP for potassium  recheck, patient comfortable with this plan, discharge.  Disposition: DISCHARGE: I believe that the patient is safe for discharge home with outpatient follow-up. Patient was informed of all pertinent physical exam, laboratory, and imaging findings. Patient's suspected etiology of their symptom presentation was discussed with the patient and all questions were answered. We discussed following up with PCP. I provided thorough ED return precautions. The patient feels safe and comfortable with this plan.   FREDRIK CANDIE Later, MD Emergency Medicine    Later Jerilynn RAMAN, MD 09/17/23 [phone]

## 2023-09-17 NOTE — ED Provider Notes (Signed)
 [place] EMERGENCY DEPARTMENT AT [facility] Provider Note   CSN: [id number] Arrival date & time: 09/17/23  9044     Patient presents with: Emesis   Joan Wood is a 53 y.o. female.  {Add pertinent medical, surgical, social history, OB history to HPI:32947} HPI     53 y.o. female with medical history significant of GERD, hypertension status post exploratory laparotomy with omental patch and liver biopsy for perforated gastric ulcer on 7-27 who presented to the emergency department due to severe abdominal pain, nausea, vomiting and diarrhea.  Patient states that her current symptoms have been present now for at least 8 days.  She is having generalized abdominal pain, that is worse on the right side.  Pain is constant, with no specific triggers, aggravating relieving factors.  She gets about 5-10 episodes of emesis, often bilious now.  She is unable to tolerate any liquid.  Additionally she has had 3-5 loose bowel movements that are watery.  Upon chart review, it appears that patient had initial surgery on 7-27.  She had ex lap, and JP drain placement.  JP drain was subsequently removed and patient has had paracentesis done   Prior to Admission medications   Medication Sig Start Date End Date Taking? Authorizing Provider  alum & mag hydroxide-simeth (MAALOX/MYLANTA) 200-200-20 MG/5ML suspension Take by mouth every 6 (six) hours as needed for indigestion or heartburn.   Yes [provider]  omeprazole  (PRILOSEC) 40 MG capsule Take 1 capsule (40 mg total) by mouth 2 (two) times daily. 08/22/23  Yes Pearlean Manus, MD  ondansetron  (ZOFRAN -ODT) 4 MG disintegrating tablet Take 1 tablet (4 mg total) by mouth every 8 (eight) hours as needed for nausea or vomiting. 08/22/23  Yes Emokpae, Courage, MD  oxyCODONE -acetaminophen  (PERCOCET/ROXICET) 5-325 MG tablet Take 1-2 tablets by mouth every 6 (six) hours as needed for severe pain (pain score 7-10). 09/14/23  Yes Delo, Vicenta,  MD  sertraline  (ZOLOFT ) 50 MG tablet Take 1 tablet (50 mg total) by mouth daily. 08/22/23  Yes Pearlean Manus, MD  Elastic Bandages & Supports (FITRITE BACK BRACE WITH PULLEY) MISC 1 Units by Does not apply route daily. 05/25/23   Bevely Doffing, FNP  hydrocortisone  (ANUSOL -HC) 25 MG suppository Place 1 suppository (25 mg total) rectally 2 (two) times daily. Patient not taking: Reported on 09/17/2023 08/22/23   Pearlean Manus, MD  oxyCODONE  (OXY IR/ROXICODONE ) 5 MG immediate release tablet Take 1 tablet (5 mg total) by mouth every 6 (six) hours as needed for severe pain (pain score 7-10) or breakthrough pain. Patient not taking: Reported on 09/17/2023 08/22/23   Kallie Manuelita BROCKS, MD  sucralfate  (CARAFATE ) 1 g tablet Take 1 tablet (1 g total) by mouth 4 (four) times daily -  with meals and at bedtime for 10 days. 09/04/23 09/14/23  Shahmehdi, Adriana LABOR, MD  Vitamin D , Ergocalciferol , (DRISDOL ) 1.25 MG (50000 UNIT) CAPS capsule Take 1 capsule (50,000 Units total) by mouth every 7 (seven) days. Patient not taking: Reported on 09/05/2023 08/22/23   Pearlean Manus, MD    Allergies: Bee venom, Fire ant, and Latex    Review of Systems  Updated Vital Signs BP 107/75   Pulse 100   Temp 98.2 F (36.8 C) (Oral)   Resp 18   Ht 5' 2 (1.575 m)   Wt 51.7 kg   SpO2 97%   BMI 20.85 kg/m   Physical Exam  (all labs ordered are listed, but only abnormal results are displayed) Labs Reviewed  COMPREHENSIVE METABOLIC PANEL WITH GFR - Abnormal; Notable for the following components:      Result Value   Sodium 131 (*)    Potassium 2.7 (*)    Chloride 91 (*)    BUN <5 (*)    Calcium  8.0 (*)    Albumin 2.2 (*)    AST 84 (*)    Total Bilirubin 2.3 (*)    All other components within normal limits  CBC WITH DIFFERENTIAL/PLATELET - Abnormal; Notable for the following components:   RBC 3.37 (*)    Hemoglobin 10.1 (*)    HCT 30.2 (*)    RDW 21.2 (*)    Platelets 85 (*)    All other components within normal  limits  URINALYSIS, ROUTINE W REFLEX MICROSCOPIC - Abnormal; Notable for the following components:   Hgb urine dipstick MODERATE (*)    All other components within normal limits  LACTIC ACID, PLASMA - Abnormal; Notable for the following components:   Lactic Acid, Venous 2.1 (*)    All other components within normal limits  CBG MONITORING, ED - Abnormal; Notable for the following components:   Glucose-Capillary 107 (*)    All other components within normal limits  LIPASE, BLOOD  LACTIC ACID, PLASMA  TROPONIN I (HIGH SENSITIVITY)    EKG: EKG Interpretation Date/Time:  Monday September 17 2023 10:28:09 EDT Ventricular Rate:  112 PR Interval:  41 QRS Duration:  147 QT Interval:  415 QTC Calculation: 567 R Axis:   87  Text Interpretation: Sinus tachycardia Nonspecific intraventricular conduction delay Borderline T abnormalities, lateral leads No acute changes No significant change since last tracing Confirmed by Charlyn Sora [phone]) on 09/17/2023 11:43:31 AM  Radiology: CT ABDOMEN PELVIS W CONTRAST Result Date: 09/17/2023 CLINICAL DATA:  Abdominal pain, post-op Bowel obstruction suspected. Peritonitis or perforation suspected. Severe R sided abd pain, distention and recent abd surgery. EXAM: CT ABDOMEN AND PELVIS WITH CONTRAST TECHNIQUE: Multidetector CT imaging of the abdomen and pelvis was performed using the standard protocol following bolus administration of intravenous contrast. RADIATION DOSE REDUCTION: This exam was performed according to the departmental dose-optimization program which includes automated exposure control, adjustment of the mA and/or kV according to patient size and/or use of iterative reconstruction technique. CONTRAST:  OMNIPAQUE  IOHEXOL  300 MG/ML  SOLN COMPARISON:  CT scan abdomen and pelvis from 09/14/2023. FINDINGS: Lower chest: There are subpleural atelectatic changes in the visualized lung bases. No overt consolidation. No pleural effusion. The heart is  normal in size. No pericardial effusion. Hepatobiliary: The liver is normal in size. There is heterogeneous liver attenuation and subtle surface nodularity, favoring cirrhosis. No suspicious mass. These is marked diffuse hepatic steatosis. No intrahepatic or extrahepatic bile duct dilation. No calcified gallstones. Normal gallbladder wall thickness. No pericholecystic inflammatory changes. Pancreas: Unremarkable. No pancreatic ductal dilatation or surrounding inflammatory changes. Spleen: Within normal limits. No focal lesion. Adrenals/Urinary Tract: Adrenal glands are unremarkable. No suspicious renal mass. No hydronephrosis. No renal or ureteric calculi. Unremarkable urinary bladder. Stomach/Bowel: No disproportionate dilation of the small or large bowel loops. No evidence of abnormal bowel wall thickening or inflammatory changes. The appendix is unremarkable. Vascular/Lymphatic: There is moderate ascites. No pneumoperitoneum. There is mild mesenteric edema, similar to the prior study. No abdominal or pelvic lymphadenopathy, by size criteria. No aneurysmal dilation of the major abdominal arteries. There are mild peripheral atherosclerotic vascular calcifications of the aorta and its major branches. Small amount of venous collaterals noted along the lower thoracic esophagus/GE junction and in the  left upper abdomen along the gastric wall, which are nonspecific but commonly seen as a sequela of portal hypertension. Reproductive: The uterus is unremarkable. No large adnexal mass. Other: Midabdominal midline surgical scar noted. The soft tissues and abdominal wall are otherwise unremarkable. Musculoskeletal: No suspicious osseous lesions. There are mild multilevel degenerative changes in the visualized spine. Redemonstration of mild anterior wedging deformity of T12 vertebra. Subacute/healing posteromedial left ninth and tenth rib fractures noted. IMPRESSION: 1. No acute inflammatory process identified within the  abdomen or pelvis. No bowel obstruction. 2. Multiple other nonacute observations, as described above. Aortic Atherosclerosis (ICD10-I70.0). Electronically Signed   By: Ree Molt M.D.   On: 09/17/2023 13:13    {Document cardiac monitor, telemetry assessment procedure when appropriate:32947} Procedures   Medications Ordered in the ED  ondansetron  (ZOFRAN ) injection 4 mg (has no administration in time range)  lactated ringers  bolus 1,000 mL (0 mLs Intravenous Stopped 09/17/23 1543)  morphine  (PF) 4 MG/ML injection 4 mg (4 mg Intravenous Given 09/17/23 1150)  ondansetron  (ZOFRAN ) injection 4 mg (4 mg Intravenous Given 09/17/23 1150)  pantoprazole  (PROTONIX ) injection 80 mg (80 mg Intravenous Given 09/17/23 1151)  iohexol  (OMNIPAQUE ) 300 MG/ML solution 100 mL (100 mLs Intravenous Contrast Given 09/17/23 1231)  magnesium  sulfate IVPB 2 g 50 mL (0 g Intravenous Stopped 09/17/23 1510)  potassium chloride  10 mEq in 100 mL IVPB (0 mEq Intravenous Stopped 09/17/23 1511)  potassium chloride  SA (KLOR-CON  M) CR tablet 40 mEq (40 mEq Oral Given 09/17/23 1413)  diazepam  (VALIUM ) tablet 5 mg (5 mg Oral Given 09/17/23 1543)  potassium chloride  SA (KLOR-CON  M) CR tablet 40 mEq (40 mEq Oral Given 09/17/23 1542)    Clinical Course as of 09/17/23 1549  Mon Sep 17, 2023  1524 CT scan is reassuring.  The patient appears reasonably screened and/or stabilized for discharge and I doubt any other medical condition or other [facility] requiring further screening, evaluation, or treatment in the ED at this time prior to discharge.   Results from the ER workup discussed with the patient face to face and all questions answered to the best of my ability. The patient is safe for discharge with strict return precautions.   [AN]  1524 Comprehensive metabolic panel(!!) Potassium is 2.7.  We will give her IV K, but also oral potassium.  Oral challenge has been initiated at this time. [AN]  1525 One of the possibilities considered for this  patient is alcohol withdrawals.  Patient states that she has been cutting back on her alcohol use.  She is tachycardic, having nausea -some of this could be because of alcohol withdrawal.  I will give her oral Valium  as we are going through oral challenge in the ER.  Her care will be signed out to incoming team.  Anticipate discharge if patient passes oral challenge. [AN]  1527 60F, homefully DC, recent gastric ulcer and alcohol related liver disease, also recent ED visit, here for abdominal pain distention, N/V, CT fine, labs okay except for potassium, lactic cleared, PO challenge, K repletion, home if PO, maybe some mild alcohol withdrawal, consider librium  taper  [LS]    Clinical Course User Index [AN] Charlyn Sora, MD [LS] Rogelia Jerilynn RAMAN, MD   {Click here for ABCD2, HEART and other calculators REFRESH Note before signing:1}                              Medical Decision Making Amount and/or Complexity of  Data Reviewed Labs: ordered. Radiology: ordered.  Risk Prescription drug management.   This patient presents to the ED with chief complaint(s) of severe abdominal pain, nausea, vomiting, diarrhea with pertinent past medical history of  recent exploratory laparotomy with omental patch and liver biopsy for perforated gastric ulcer, alcoholic liver cirrhosis. The complaint involves an extensive differential diagnosis and also carries with it a high risk of complications and morbidity.    I reviewed patient's records including discharge summaries, 3 CT scans that she has had since June 2025.  I also reviewed patient's GI note, general surgery note and patient's upper endoscopy from previous year.  Unfortunately, patient has profound tenderness on exam and so we will have to get CT scan of the abdomen and pelvis.,   The differential diagnosis includes : Acute small bowel obstruction, perforated viscus, C. difficile colitis, spontaneous bacterial peritonitis.  The initial plan is to get  basic labs, CT scan, focus on pain control.  I will perform bedside ultrasound to see if there is significant drainage for us  to perform diagnostic paracentesis.   Additional history obtained: Records reviewed previous admission documents and previous surgery notes, CT scans  Independent labs interpretation:  The following labs were independently interpreted: Potassium of 2.7. Hemoglobin is 10.1. Labs are overall reassuring levels for the patient besides potassium.  IV magnesium  given and lieu of patient having some diarrhea, we suspect she probably has hypomagnesemia.  Additionally she has history of alcohol use disorder, therefore vestiges given IV magnesium .  Independent visualization and interpretation of imaging: - I independently visualized the following imaging with scope of interpretation limited to determining acute life threatening conditions related to emergency care: CT abdomen pelvis, which revealed no evidence of perforation. There is mild ascites, not enough to do paracentesis.  Treatment and Reassessment: The patient appears reasonably screened and/or stabilized for discharge and I doubt any other medical condition or other [facility] requiring further screening, evaluation, or treatment in the ED at this time prior to discharge.   Results from the ER workup discussed with the patient face to face and all questions answered to the best of my ability. The patient is safe for discharge with strict return precautions.    Final diagnoses:  Acute hypokalemia  Nausea and vomiting, unspecified vomiting type    ED Discharge Orders     None

## 2023-09-17 NOTE — Patient Instructions (Signed)
 1) Patient advised to return to ED due to symptoms affecting her daily health not resolved

## 2023-09-17 NOTE — ED Triage Notes (Signed)
 Pt arrived via POV from PCP office for further evaluation of N/V/D that has been on-going since last week. Pt denies hematochezia, but endorses severe epigastric pain and RUQ/RLQ abdominal pain.

## 2023-09-18 ENCOUNTER — Telehealth: Payer: Self-pay | Admitting: *Deleted

## 2023-09-18 ENCOUNTER — Encounter: Payer: Self-pay | Admitting: *Deleted

## 2023-09-18 NOTE — Patient Instructions (Signed)
 Joan Wood - I am sorry I was unable to reach you today for our scheduled appointment. I work with Zarwolo, Gloria, FNP and am calling to support your healthcare needs. Please contact me at [phone] at your earliest convenience. I look forward to speaking with you soon.   Thank you,  Rosina Forte, BSN RN [facility], [facility] RN Care Manager Direct Dial: [phone]  Fax: [phone]

## 2023-09-28 ENCOUNTER — Telehealth: Payer: Self-pay | Admitting: *Deleted

## 2023-09-28 NOTE — Progress Notes (Signed)
 Complex Care Management Care Guide Note  09/28/2023 Name: LEQUISHA CAMMACK MRN: [id number] DOB: [birth year]  DAZIA LIPPOLD is a 53 y.o. year old female who is a primary care patient of Zarwolo, Gloria, FNP and is actively engaged with the care management team. I reached out to [name] by phone today to assist with re-scheduling  with the RN Case Manager.  Follow up plan: Unsuccessful telephone outreach attempt made. A HIPAA compliant phone message was left for the patient providing contact information and requesting a return call.  Thedford Franks, CMA [facility], [facility] Guide Direct Dial: [phone]  Fax: [phone] Website: [email or web address]

## 2023-09-29 ENCOUNTER — Encounter (HOSPITAL_COMMUNITY): Payer: Self-pay | Admitting: Emergency Medicine

## 2023-09-29 ENCOUNTER — Other Ambulatory Visit: Payer: Self-pay

## 2023-09-29 ENCOUNTER — Emergency Department (HOSPITAL_COMMUNITY)

## 2023-09-29 ENCOUNTER — Emergency Department (HOSPITAL_COMMUNITY)
Admission: EM | Admit: 2023-09-29 | Discharge: 2023-09-29 | Disposition: A | Discharge status: Home / Self Care | Attending: Emergency Medicine | Admitting: Emergency Medicine

## 2023-09-29 DIAGNOSIS — R1084 Generalized abdominal pain: Secondary | ICD-10-CM | POA: Diagnosis not present

## 2023-09-29 DIAGNOSIS — Y908 Blood alcohol level of 240 mg/100 ml or more: Secondary | ICD-10-CM | POA: Diagnosis not present

## 2023-09-29 DIAGNOSIS — R109 Unspecified abdominal pain: Secondary | ICD-10-CM | POA: Diagnosis present

## 2023-09-29 DIAGNOSIS — K7031 Alcoholic cirrhosis of liver with ascites: Secondary | ICD-10-CM | POA: Diagnosis not present

## 2023-09-29 DIAGNOSIS — R609 Edema, unspecified: Secondary | ICD-10-CM | POA: Diagnosis not present

## 2023-09-29 DIAGNOSIS — R197 Diarrhea, unspecified: Secondary | ICD-10-CM | POA: Diagnosis not present

## 2023-09-29 LAB — URINALYSIS, ROUTINE W REFLEX MICROSCOPIC
Bilirubin Urine: NEGATIVE
Glucose, UA: NEGATIVE mg/dL
Ketones, ur: NEGATIVE mg/dL
Leukocytes,Ua: NEGATIVE
Nitrite: NEGATIVE
Protein, ur: NEGATIVE mg/dL
Specific Gravity, Urine: 1.003 — ABNORMAL LOW (ref 1.005–1.030)
pH: 6 (ref 5.0–8.0)

## 2023-09-29 LAB — CBC
HCT: 26.6 % — ABNORMAL LOW (ref 36.0–46.0)
Hemoglobin: 9.1 g/dL — ABNORMAL LOW (ref 12.0–15.0)
MCH: 30.6 pg (ref 26.0–34.0)
MCHC: 34.2 g/dL (ref 30.0–36.0)
MCV: 89.6 fL (ref 80.0–100.0)
Platelets: 93 K/uL — ABNORMAL LOW (ref 150–400)
RBC: 2.97 MIL/uL — ABNORMAL LOW (ref 3.87–5.11)
RDW: 23 % — ABNORMAL HIGH (ref 11.5–15.5)
WBC: 4.5 K/uL (ref 4.0–10.5)
nRBC: 0 % (ref 0.0–0.2)

## 2023-09-29 LAB — BODY FLUID CELL COUNT WITH DIFFERENTIAL
Eos, Fluid: 0 %
Lymphs, Fluid: 67 %
Monocyte-Macrophage-Serous Fluid: 23 % — ABNORMAL LOW (ref 50–90)
Neutrophil Count, Fluid: 10 % (ref 0–25)
Total Nucleated Cell Count, Fluid: 197 uL (ref 0–1000)

## 2023-09-29 LAB — COMPREHENSIVE METABOLIC PANEL WITH GFR
ALT: 21 U/L (ref 0–44)
AST: 92 U/L — ABNORMAL HIGH (ref 15–41)
Albumin: 1.9 g/dL — ABNORMAL LOW (ref 3.5–5.0)
Alkaline Phosphatase: 70 U/L (ref 38–126)
Anion gap: 13 (ref 5–15)
BUN: <5 mg/dL — ABNORMAL LOW (ref 6–20)
CO2: 20 mmol/L — ABNORMAL LOW (ref 22–32)
Calcium: 7.5 mg/dL — ABNORMAL LOW (ref 8.9–10.3)
Chloride: 92 mmol/L — ABNORMAL LOW (ref 98–111)
Creatinine, Ser: 0.41 mg/dL — ABNORMAL LOW (ref 0.44–1.00)
GFR, Estimated: >60 mL/min (ref >60)
Glucose, Bld: 84 mg/dL (ref 70–99)
Potassium: 3.5 mmol/L (ref 3.5–5.1)
Sodium: 125 mmol/L — ABNORMAL LOW (ref 135–145)
Total Bilirubin: 3 mg/dL — ABNORMAL HIGH (ref 0.0–1.2)
Total Protein: 6.7 g/dL (ref 6.5–8.1)

## 2023-09-29 LAB — TYPE AND SCREEN
ABO/RH(D): A POS
Antibody Screen: NEGATIVE

## 2023-09-29 LAB — PROTIME-INR
INR: 1.6 — ABNORMAL HIGH (ref 0.8–1.2)
Prothrombin Time: 19.9 s — ABNORMAL HIGH (ref 11.4–15.2)

## 2023-09-29 LAB — MAGNESIUM: Magnesium: 1.6 mg/dL — ABNORMAL LOW (ref 1.7–2.4)

## 2023-09-29 LAB — PROTEIN, PLEURAL OR PERITONEAL FLUID: Total protein, fluid: <3 g/dL

## 2023-09-29 LAB — ALBUMIN, PLEURAL OR PERITONEAL FLUID: Albumin, Fluid: <1.5 g/dL

## 2023-09-29 LAB — ETHANOL: Alcohol, Ethyl (B): 218 mg/dL — ABNORMAL HIGH (ref <15)

## 2023-09-29 MED ORDER — MORPHINE SULFATE (PF) 4 MG/ML IV SOLN
4.0000 mg | Freq: Once | INTRAVENOUS | Status: AC
  Administered 2023-09-29: 4 mg via INTRAVENOUS
  Filled 2023-09-29: qty 1

## 2023-09-29 MED ORDER — SODIUM CHLORIDE 0.9 % IV BOLUS
500.0000 mL | Freq: Once | INTRAVENOUS | Status: AC
  Administered 2023-09-29: 500 mL via INTRAVENOUS

## 2023-09-29 MED ORDER — OXYCODONE-ACETAMINOPHEN 5-325 MG PO TABS: 1.0000 | ORAL_TABLET | Freq: Four times a day (QID) | ORAL | 0 refills | Status: AC | PRN

## 2023-09-29 MED ORDER — ONDANSETRON HCL 4 MG/2ML IJ SOLN
4.0000 mg | Freq: Once | INTRAMUSCULAR | Status: AC
  Administered 2023-09-29: 4 mg via INTRAVENOUS
  Filled 2023-09-29: qty 2

## 2023-09-29 MED ORDER — ONDANSETRON 4 MG PO TBDP: 4.0000 mg | ORAL_TABLET | Freq: Three times a day (TID) | ORAL | 0 refills | Status: AC | PRN

## 2023-09-29 MED ORDER — IOHEXOL 300 MG/ML  SOLN
100.0000 mL | Freq: Once | INTRAMUSCULAR | Status: AC | PRN
  Administered 2023-09-29: 100 mL via INTRAVENOUS

## 2023-09-29 NOTE — ED Triage Notes (Signed)
 BIB EMS from home. Severe abdominal pain and swelling x 3-4 days. Reports stomach is 5 times it's normal size.  Rigid to palpation  Diarrhea and rectal bleeding also reported.  VS 112/77, 99% RA CBG 108.  Pulse 118  20 G LFA

## 2023-09-29 NOTE — ED Notes (Signed)
 Provider notified of pt requesting nausea medication.

## 2023-09-29 NOTE — ED Notes (Signed)
 ED Provider at bedside for paracentesis.

## 2023-09-29 NOTE — ED Notes (Signed)
 [facility] for transport.

## 2023-09-29 NOTE — ED Provider Notes (Signed)
 Paracentesis  Date/Time: 09/29/2023 7:15 PM  Performed by: Towana Ozell BROCKS, MD Authorized by: Towana Ozell BROCKS, MD   Consent:    Consent obtained:  Written   Consent given by:  Patient   Risks, benefits, and alternatives were discussed: yes     Risks discussed:  Bleeding, bowel perforation, infection and pain   Alternatives discussed:  Observation, referral and no treatment Universal protocol:    Procedure explained and questions answered to patient or proxy's satisfaction: yes     Relevant documents present and verified: yes     Patient identity confirmed:  Verbally with patient Pre-procedure details:    Procedure purpose:  Diagnostic   Preparation: Patient was prepped and draped in usual sterile fashion   Anesthesia:    Anesthesia method:  Local infiltration   Local anesthetic:  Lidocaine  1% w/o epi Procedure details:    Needle gauge:  18   Ultrasound guidance: yes     Puncture site:  R lower quadrant   Fluid removed amount:  20   Fluid appearance:  Yellow and clear   Dressing:  4x4 sterile gauze Post-procedure details:    Procedure completion:  Tolerated well, no immediate complications     Towana Ozell BROCKS, MD 09/30/23 1008

## 2023-09-29 NOTE — Discharge Instructions (Addendum)
 As we discussed, you can be discharged home and are strongly encouraged to call Dr. Samuel office on Monday to schedule a time to be seen for treatment of your liver disease. Your primary care doctor can assist you with pain control. A prescription for pain and nausea medication has been provided.

## 2023-09-29 NOTE — ED Provider Notes (Signed)
 [place] EMERGENCY DEPARTMENT AT [facility] Provider Note   CSN: [id number] Arrival date & time: 09/29/23  1410     Patient presents with: Abdominal Pain   Joan Wood is a 53 y.o. female.  {Add pertinent medical, surgical, social history, OB history to YEP:67052} Patient to ED with complaint of progressively worsening abdominal pain and swelling over the last 4 days. She got up this morning to have a bowel movement and reports passing brown stool with significant amount of BRB that continued through the morning prompting EMS to be called for transportation to the emergency department. No fever. No nausea or vomiting. She reports history of alcohol abuse, PUD, recent admission for GI bleeding, cirrhosis. She denies any history of needing paracentesis in the past.   The history is provided by the patient. No language interpreter was used.  Abdominal Pain      Prior to Admission medications   Medication Sig Start Date End Date Taking? Authorizing Provider  alum & mag hydroxide-simeth (MAALOX/MYLANTA) 200-200-20 MG/5ML suspension Take by mouth every 6 (six) hours as needed for indigestion or heartburn.    [provider]  Elastic Bandages & Supports (FITRITE BACK BRACE WITH PULLEY) MISC 1 Units by Does not apply route daily. 05/25/23   Bevely Doffing, FNP  hydrocortisone  (ANUSOL -HC) 25 MG suppository Place 1 suppository (25 mg total) rectally 2 (two) times daily. Patient not taking: Reported on 09/17/2023 08/22/23   Pearlean Manus, MD  omeprazole  (PRILOSEC) 40 MG capsule Take 1 capsule (40 mg total) by mouth 2 (two) times daily. 08/22/23   Pearlean Manus, MD  ondansetron  (ZOFRAN ) 4 MG tablet Take 1 tablet (4 mg total) by mouth every 8 (eight) hours as needed for nausea or vomiting. 09/17/23   Rogelia Jerilynn RAMAN, MD  ondansetron  (ZOFRAN -ODT) 4 MG disintegrating tablet Take 1 tablet (4 mg total) by mouth every 8 (eight) hours as needed for nausea or vomiting. 08/22/23    Pearlean Manus, MD  oxyCODONE -acetaminophen  (PERCOCET/ROXICET) 5-325 MG tablet Take 1-2 tablets by mouth every 6 (six) hours as needed for severe pain (pain score 7-10). 09/14/23   Geroldine Berg, MD  potassium chloride  SA (KLOR-CON  M) 20 MEQ tablet Take 2 tablets (40 mEq total) by mouth daily for 2 doses. 09/17/23 09/19/23  Rogelia Jerilynn RAMAN, MD  sertraline  (ZOLOFT ) 50 MG tablet Take 1 tablet (50 mg total) by mouth daily. 08/22/23   Pearlean Manus, MD  sucralfate  (CARAFATE ) 1 g tablet Take 1 tablet (1 g total) by mouth 4 (four) times daily -  with meals and at bedtime for 10 days. 09/04/23 09/14/23  Shahmehdi, Adriana LABOR, MD  Vitamin D , Ergocalciferol , (DRISDOL ) 1.25 MG (50000 UNIT) CAPS capsule Take 1 capsule (50,000 Units total) by mouth every 7 (seven) days. Patient not taking: Reported on 09/05/2023 08/22/23   Pearlean Manus, MD    Allergies: Bee venom, Fire ant, and Latex    Review of Systems  Gastrointestinal:  Positive for abdominal pain.    Updated Vital Signs BP 96/67   Pulse (!) 104   Temp 98.2 F (36.8 C) (Oral)   Resp 11   SpO2 100%   Physical Exam Vitals and nursing note reviewed.  Constitutional:      Appearance: She is well-developed.  HENT:     Head: Normocephalic.  Eyes:     General: No scleral icterus. Cardiovascular:     Rate and Rhythm: Normal rate and regular rhythm.     Heart sounds: No murmur heard. Pulmonary:  Effort: Pulmonary effort is normal.     Breath sounds: Normal breath sounds. No wheezing, rhonchi or rales.  Abdominal:     General: Abdomen is protuberant. Bowel sounds are normal.     Palpations: Abdomen is soft. There is fluid wave.     Tenderness: There is generalized abdominal tenderness. There is no guarding or rebound.  Musculoskeletal:        General: Normal range of motion.     Cervical back: Normal range of motion and neck supple.  Skin:    General: Skin is warm and dry.  Neurological:     General: No focal deficit present.      Mental Status: She is alert and oriented to person, place, and time.     (all labs ordered are listed, but only abnormal results are displayed) Labs Reviewed  COMPREHENSIVE METABOLIC PANEL WITH GFR - Abnormal; Notable for the following components:      Result Value   Sodium 125 (*)    Chloride 92 (*)    CO2 20 (*)    BUN <5 (*)    Creatinine, Ser 0.41 (*)    Calcium  7.5 (*)    Albumin  1.9 (*)    AST 92 (*)    Total Bilirubin 3.0 (*)    All other components within normal limits  CBC  ETHANOL  URINALYSIS, ROUTINE W REFLEX MICROSCOPIC  PROTIME-INR  POC OCCULT BLOOD, ED  TYPE AND SCREEN    EKG: None  Radiology: No results found.  {Document cardiac monitor, telemetry assessment procedure when appropriate:32947} Procedures   Medications Ordered in the ED  morphine  (PF) 4 MG/ML injection 4 mg (has no administration in time range)    Clinical Course as of 09/29/23 2149  Sat Sep 29, 2023  1525 Patient with history of alcoholic cirrhosis,  laparoscopic perforated gastric ulcer repair (7/25) with recent admission for abdominal pain, FOBT +, nausea, vomiting. Started having abdominal pain x 4 days, copious BRB per rectum today. She reports drinking only 2-3 times per week, none last night.  [SU]  5162 53 year old female here with abdominal pain and rectal bleeding.  It sounds like she may have been told she has cirrhosis 6 or 9 months ago but never followed up with anybody.  Continues to drink here.  Has distended abdomen diffuse tenderness.  Pending labs and CT, may need paracentesis. [MB]  1822 CT per radiology:  IMPRESSION: 1. Marked colonic wall thickening involving the cecum and ascending colon, which may be related to colitis versus portal colopathy and unchanged from prior exams. 2. Large ascites. 3. Morphologic changes of cirrhosis and portal hypertension. 4. Aortic atherosclerosis.    [SU]  1903 Discussed with Dr. Eartha, GI, who advises that is she is  dispositioned to home, he could see her in the office on Monday.   Dr. Towana to perform diagnostic paracentesis. If negative for SBP, she can be discharged home to outpatient follow up.  [SU]  2141 Patient has been to the toilet multiple times since arrival without further bleeding. Labs are baseline for her. ETOH elevated at 212 despite insisting she did not drink alcohol last night. Pain and nausea have been controlled with medications. IV fluids provided. Diagnostic paracentesis performed by Dr. Towana. No evidence SBP.   Discussed discharge home. Patient states she has no ride home. Reassured her this could be arranged. Strongly encouraged her to follow up with GI on Monday Arch).  [SU]    Clinical Course User Index [MB] Towana,  Ozell BROCKS, MD [SU] Odell Balls, PA-C   {Click here for ABCD2, HEART and other calculators REFRESH Note before signing:1}                              Medical Decision Making Amount and/or Complexity of Data Reviewed Labs: ordered. Radiology: ordered.  Risk Prescription drug management.   ***  {Document critical care time when appropriate  Document review of labs and clinical decision tools ie CHADS2VASC2, etc  Document your independent review of radiology images and any outside records  Document your discussion with family members, caretakers and with consultants  Document social determinants of health affecting pt's care  Document your decision making why or why not admission, treatments were needed:32947:::1}   Final diagnoses:  None    ED Discharge Orders     None

## 2023-10-01 LAB — TOTAL BILIRUBIN, BODY FLUID

## 2023-10-01 LAB — PATHOLOGIST SMEAR REVIEW

## 2023-10-01 NOTE — Progress Notes (Signed)
 Complex Care Management Care Guide Note  10/01/2023 Name: Joan Wood MRN: [id number] DOB: [birth year]  Joan Wood is a 53 y.o. year old female who is a primary care patient of Zarwolo, Gloria, FNP and is actively engaged with the care management team. I reached out to [name] by phone today to assist with re-scheduling  with the RN Case Manager.  Follow up plan: Unsuccessful telephone outreach attempt made. A HIPAA compliant phone message was left for the patient providing contact information and requesting a return call. No further outreach attempts will be made due to inability to maintain patient contact.   Thedford Franks, CMA, Care Guide [facility]  [facility], [facility] Guide Direct Dial: [phone]  Fax: [phone] Website: [email or web address]

## 2023-10-02 LAB — TRIGLYCERIDES, BODY FLUIDS: Triglycerides, Fluid: 44 mg/dL

## 2023-10-03 ENCOUNTER — Other Ambulatory Visit: Payer: Self-pay

## 2023-10-03 ENCOUNTER — Inpatient Hospital Stay (HOSPITAL_COMMUNITY)
Admission: EM | Admit: 2023-10-03 | Source: Home / Self Care | Attending: Internal Medicine | Admitting: Internal Medicine

## 2023-10-03 ENCOUNTER — Encounter (HOSPITAL_COMMUNITY): Payer: Self-pay

## 2023-10-03 ENCOUNTER — Emergency Department (HOSPITAL_COMMUNITY)

## 2023-10-03 DIAGNOSIS — K7290 Hepatic failure, unspecified without coma: Secondary | ICD-10-CM

## 2023-10-03 DIAGNOSIS — K2289 Other specified disease of esophagus: Secondary | ICD-10-CM

## 2023-10-03 DIAGNOSIS — R112 Nausea with vomiting, unspecified: Principal | ICD-10-CM | POA: Diagnosis present

## 2023-10-03 DIAGNOSIS — E871 Hypo-osmolality and hyponatremia: Secondary | ICD-10-CM

## 2023-10-03 DIAGNOSIS — Z720 Tobacco use: Secondary | ICD-10-CM | POA: Diagnosis present

## 2023-10-03 DIAGNOSIS — K7682 Hepatic encephalopathy: Secondary | ICD-10-CM

## 2023-10-03 DIAGNOSIS — K922 Gastrointestinal hemorrhage, unspecified: Secondary | ICD-10-CM | POA: Diagnosis not present

## 2023-10-03 DIAGNOSIS — R197 Diarrhea, unspecified: Principal | ICD-10-CM

## 2023-10-03 DIAGNOSIS — E43 Unspecified severe protein-calorie malnutrition: Secondary | ICD-10-CM | POA: Insufficient documentation

## 2023-10-03 DIAGNOSIS — F1090 Alcohol use, unspecified, uncomplicated: Secondary | ICD-10-CM | POA: Diagnosis present

## 2023-10-03 DIAGNOSIS — N179 Acute kidney failure, unspecified: Secondary | ICD-10-CM

## 2023-10-03 DIAGNOSIS — L8990 Pressure ulcer of unspecified site, unspecified stage: Secondary | ICD-10-CM | POA: Insufficient documentation

## 2023-10-03 DIAGNOSIS — Z7189 Other specified counseling: Secondary | ICD-10-CM

## 2023-10-03 DIAGNOSIS — D62 Acute posthemorrhagic anemia: Secondary | ICD-10-CM

## 2023-10-03 DIAGNOSIS — K3580 Unspecified acute appendicitis: Secondary | ICD-10-CM

## 2023-10-03 DIAGNOSIS — G894 Chronic pain syndrome: Secondary | ICD-10-CM

## 2023-10-03 DIAGNOSIS — K7460 Unspecified cirrhosis of liver: Secondary | ICD-10-CM | POA: Diagnosis present

## 2023-10-03 LAB — RESP PANEL BY RT-PCR (RSV, FLU A&B, COVID)  RVPGX2
Influenza A by PCR: NEGATIVE
Influenza B by PCR: NEGATIVE
Resp Syncytial Virus by PCR: NEGATIVE
SARS Coronavirus 2 by RT PCR: NEGATIVE

## 2023-10-03 LAB — CBC WITH DIFFERENTIAL/PLATELET
Abs Immature Granulocytes: 0.04 K/uL (ref 0.00–0.07)
Basophils Absolute: 0 K/uL (ref 0.0–0.1)
Basophils Relative: 0 %
Eosinophils Absolute: 0 K/uL (ref 0.0–0.5)
Eosinophils Relative: 0 %
HCT: 24.1 % — ABNORMAL LOW (ref 36.0–46.0)
Hemoglobin: 8.3 g/dL — ABNORMAL LOW (ref 12.0–15.0)
Immature Granulocytes: 1 %
Lymphocytes Relative: 7 %
Lymphs Abs: 0.5 K/uL — ABNORMAL LOW (ref 0.7–4.0)
MCH: 31.9 pg (ref 26.0–34.0)
MCHC: 34.4 g/dL (ref 30.0–36.0)
MCV: 92.7 fL (ref 80.0–100.0)
Monocytes Absolute: 0.8 K/uL (ref 0.1–1.0)
Monocytes Relative: 12 %
Neutro Abs: 5 K/uL (ref 1.7–7.7)
Neutrophils Relative %: 80 %
Platelets: 83 K/uL — ABNORMAL LOW (ref 150–400)
RBC: 2.6 MIL/uL — ABNORMAL LOW (ref 3.87–5.11)
RDW: 24.4 % — ABNORMAL HIGH (ref 11.5–15.5)
WBC: 6.3 K/uL (ref 4.0–10.5)
nRBC: 0 % (ref 0.0–0.2)

## 2023-10-03 LAB — URINALYSIS, ROUTINE W REFLEX MICROSCOPIC
Bacteria, UA: NONE SEEN
Glucose, UA: NEGATIVE mg/dL
Ketones, ur: 20 mg/dL — AB
Leukocytes,Ua: NEGATIVE
Nitrite: NEGATIVE
Protein, ur: NEGATIVE mg/dL
Specific Gravity, Urine: >1.046 — ABNORMAL HIGH (ref 1.005–1.030)
pH: 6 (ref 5.0–8.0)

## 2023-10-03 LAB — BODY FLUID CULTURE W GRAM STAIN
Culture: NONE SEEN
Gram Stain: NONE SEEN

## 2023-10-03 LAB — COMPREHENSIVE METABOLIC PANEL WITH GFR
ALT: 32 U/L (ref 0–44)
AST: 138 U/L — ABNORMAL HIGH (ref 15–41)
Albumin: 1.8 g/dL — ABNORMAL LOW (ref 3.5–5.0)
Alkaline Phosphatase: 74 U/L (ref 38–126)
Anion gap: 16 — ABNORMAL HIGH (ref 5–15)
BUN: <5 mg/dL — ABNORMAL LOW (ref 6–20)
CO2: 21 mmol/L — ABNORMAL LOW (ref 22–32)
Calcium: 7.5 mg/dL — ABNORMAL LOW (ref 8.9–10.3)
Chloride: 86 mmol/L — ABNORMAL LOW (ref 98–111)
Creatinine, Ser: 0.5 mg/dL (ref 0.44–1.00)
GFR, Estimated: >60 mL/min (ref >60)
Glucose, Bld: 94 mg/dL (ref 70–99)
Potassium: 3.3 mmol/L — ABNORMAL LOW (ref 3.5–5.1)
Sodium: 123 mmol/L — ABNORMAL LOW (ref 135–145)
Total Bilirubin: 5.6 mg/dL — ABNORMAL HIGH (ref 0.0–1.2)
Total Protein: 6.3 g/dL — ABNORMAL LOW (ref 6.5–8.1)

## 2023-10-03 LAB — CBG MONITORING, ED: Glucose-Capillary: 98 mg/dL (ref 70–99)

## 2023-10-03 LAB — PROTIME-INR
INR: 2 — ABNORMAL HIGH (ref 0.8–1.2)
Prothrombin Time: 24 s — ABNORMAL HIGH (ref 11.4–15.2)

## 2023-10-03 LAB — MAGNESIUM: Magnesium: 1.4 mg/dL — ABNORMAL LOW (ref 1.7–2.4)

## 2023-10-03 LAB — LIPASE, BLOOD: Lipase: 36 U/L (ref 11–51)

## 2023-10-03 LAB — GLUCOSE, CAPILLARY: Glucose-Capillary: 98 mg/dL (ref 70–99)

## 2023-10-03 LAB — PHOSPHORUS: Phosphorus: 3 mg/dL (ref 2.5–4.6)

## 2023-10-03 MED ORDER — SODIUM CHLORIDE 0.9 % IV SOLN: INTRAVENOUS | Status: DC

## 2023-10-03 MED ORDER — ONDANSETRON HCL 4 MG/2ML IJ SOLN
4.0000 mg | Freq: Once | INTRAMUSCULAR | Status: AC
  Administered 2023-10-03: 4 mg via INTRAVENOUS
  Filled 2023-10-03: qty 2

## 2023-10-03 MED ORDER — LACTATED RINGERS IV BOLUS
1000.0000 mL | Freq: Once | INTRAVENOUS | Status: AC
  Administered 2023-10-03: 1000 mL via INTRAVENOUS

## 2023-10-03 MED ORDER — THIAMINE MONONITRATE 100 MG PO TABS
100.0000 mg | ORAL_TABLET | Freq: Every day | ORAL | Status: DC
  Administered 2023-10-04: 100 mg via ORAL
  Filled 2023-10-03: qty 1

## 2023-10-03 MED ORDER — ALBUTEROL SULFATE (2.5 MG/3ML) 0.083% IN NEBU
2.5000 mg | INHALATION_SOLUTION | RESPIRATORY_TRACT | Status: AC | PRN
  Administered 2023-10-30: 2.5 mg via RESPIRATORY_TRACT
  Filled 2023-10-03: qty 3

## 2023-10-03 MED ORDER — IOHEXOL 350 MG/ML SOLN
100.0000 mL | Freq: Once | INTRAVENOUS | Status: AC | PRN
  Administered 2023-10-03: 100 mL via INTRAVENOUS

## 2023-10-03 MED ORDER — ONDANSETRON HCL 4 MG/2ML IJ SOLN
4.0000 mg | Freq: Four times a day (QID) | INTRAMUSCULAR | Status: DC | PRN
  Administered 2023-10-04 – 2023-10-05 (×4): 4 mg via INTRAVENOUS
  Filled 2023-10-03 (×4): qty 2

## 2023-10-03 MED ORDER — THIAMINE HCL 100 MG/ML IJ SOLN
100.0000 mg | Freq: Every day | INTRAMUSCULAR | Status: DC
  Administered 2023-10-05 – 2023-10-07 (×3): 100 mg via INTRAVENOUS
  Filled 2023-10-03 (×3): qty 2

## 2023-10-03 MED ORDER — POTASSIUM CHLORIDE 10 MEQ/100ML IV SOLN
10.0000 meq | INTRAVENOUS | Status: AC
  Administered 2023-10-03 – 2023-10-04 (×3): 10 meq via INTRAVENOUS
  Filled 2023-10-03 (×3): qty 100

## 2023-10-03 MED ORDER — SODIUM CHLORIDE 0.9 % IV SOLN
2.0000 g | INTRAVENOUS | Status: AC
  Administered 2023-10-04 – 2023-10-08 (×5): 2 g via INTRAVENOUS
  Filled 2023-10-03 (×5): qty 20

## 2023-10-03 MED ORDER — ONDANSETRON HCL 4 MG PO TABS: 4.0000 mg | ORAL_TABLET | Freq: Four times a day (QID) | ORAL | Status: DC | PRN

## 2023-10-03 MED ORDER — MAGNESIUM SULFATE 4 GM/100ML IV SOLN
4.0000 g | Freq: Once | INTRAVENOUS | Status: AC
Start: 2023-10-03 — End: 2023-10-04
  Administered 2023-10-03: 4 g via INTRAVENOUS
  Filled 2023-10-03: qty 100

## 2023-10-03 MED ORDER — LORAZEPAM 2 MG/ML IJ SOLN
1.0000 mg | INTRAMUSCULAR | Status: AC | PRN
  Administered 2023-10-04 – 2023-10-06 (×3): 1 mg via INTRAVENOUS
  Filled 2023-10-03 (×3): qty 1

## 2023-10-03 MED ORDER — IOHEXOL 300 MG/ML  SOLN
100.0000 mL | Freq: Once | INTRAMUSCULAR | Status: DC | PRN
Start: 2023-10-03 — End: 2023-10-03

## 2023-10-03 MED ORDER — SODIUM CHLORIDE 0.9 % IV SOLN
1.0000 g | Freq: Once | INTRAVENOUS | Status: AC
  Administered 2023-10-03: 1 g via INTRAVENOUS
  Filled 2023-10-03: qty 10

## 2023-10-03 MED ORDER — METOCLOPRAMIDE HCL 5 MG/ML IJ SOLN
10.0000 mg | Freq: Once | INTRAMUSCULAR | Status: AC
  Administered 2023-10-03: 10 mg via INTRAVENOUS
  Filled 2023-10-03: qty 2

## 2023-10-03 MED ORDER — LORAZEPAM 1 MG PO TABS: 1.0000 mg | ORAL_TABLET | ORAL | Status: DC | PRN

## 2023-10-03 MED ORDER — VITAMIN K1 10 MG/ML IJ SOLN
10.0000 mg | Freq: Once | INTRAVENOUS | Status: AC
  Administered 2023-10-03: 10 mg via INTRAVENOUS
  Filled 2023-10-03: qty 1

## 2023-10-03 MED ORDER — PANTOPRAZOLE SODIUM 40 MG IV SOLR
40.0000 mg | Freq: Once | INTRAVENOUS | Status: AC
  Administered 2023-10-03: 40 mg via INTRAVENOUS
  Filled 2023-10-03: qty 10

## 2023-10-03 MED ORDER — NICOTINE 21 MG/24HR TD PT24
21.0000 mg | MEDICATED_PATCH | Freq: Every day | TRANSDERMAL | Status: DC
  Administered 2023-10-08 – 2023-10-31 (×10): 21 mg via TRANSDERMAL
  Filled 2023-10-03 (×29): qty 1

## 2023-10-03 MED ORDER — PANTOPRAZOLE SODIUM 40 MG IV SOLR
40.0000 mg | Freq: Two times a day (BID) | INTRAVENOUS | Status: DC
Start: 2023-10-04 — End: 2023-10-07
  Administered 2023-10-04 – 2023-10-07 (×7): 40 mg via INTRAVENOUS
  Filled 2023-10-03 (×7): qty 10

## 2023-10-03 MED ORDER — LACTATED RINGERS IV SOLN: INTRAVENOUS | Status: AC

## 2023-10-03 MED ORDER — ADULT MULTIVITAMIN W/MINERALS CH
1.0000 | ORAL_TABLET | Freq: Every day | ORAL | Status: DC
  Administered 2023-10-04: 1 via ORAL
  Filled 2023-10-03: qty 1

## 2023-10-03 MED ORDER — POTASSIUM CHLORIDE 20 MEQ PO PACK
40.0000 meq | PACK | Freq: Once | ORAL | Status: AC
  Administered 2023-10-03: 40 meq via ORAL
  Filled 2023-10-03: qty 2

## 2023-10-03 MED ORDER — ALBUMIN HUMAN 25 % IV SOLN: 25.0000 g | Freq: Once | INTRAVENOUS | Status: DC | PRN

## 2023-10-03 MED ORDER — HYDROMORPHONE HCL 1 MG/ML IJ SOLN
0.5000 mg | Freq: Once | INTRAMUSCULAR | Status: AC
  Administered 2023-10-03: 0.5 mg via INTRAVENOUS
  Filled 2023-10-03: qty 0.5

## 2023-10-03 MED ORDER — OXYCODONE HCL 5 MG PO TABS
5.0000 mg | ORAL_TABLET | ORAL | Status: DC | PRN
  Administered 2023-10-03 – 2023-10-04 (×3): 5 mg via ORAL
  Filled 2023-10-03 (×3): qty 1

## 2023-10-03 MED ORDER — SODIUM CHLORIDE 0.9 % IV SOLN
25.0000 ug/h | INTRAVENOUS | Status: DC
  Administered 2023-10-03 – 2023-10-09 (×7): 25 ug/h via INTRAVENOUS
  Filled 2023-10-03 (×10): qty 1

## 2023-10-03 MED ORDER — OCTREOTIDE LOAD VIA INFUSION
50.0000 ug | Freq: Once | INTRAVENOUS | Status: AC
  Administered 2023-10-03: 50 ug via INTRAVENOUS
  Filled 2023-10-03: qty 25

## 2023-10-03 MED ORDER — HYDROMORPHONE HCL 1 MG/ML IJ SOLN: 1.0000 mg | Freq: Once | INTRAMUSCULAR | Status: DC

## 2023-10-03 MED ORDER — FOLIC ACID 1 MG PO TABS
1.0000 mg | ORAL_TABLET | Freq: Every day | ORAL | Status: DC
  Administered 2023-10-04: 1 mg via ORAL
  Filled 2023-10-03: qty 1

## 2023-10-03 MED ORDER — CHLORHEXIDINE GLUCONATE CLOTH 2 % EX PADS
6.0000 | MEDICATED_PAD | Freq: Every day | CUTANEOUS | Status: DC
  Administered 2023-10-04 – 2023-10-07 (×4): 6 via TOPICAL

## 2023-10-03 NOTE — ED Provider Notes (Signed)
 [place] EMERGENCY DEPARTMENT AT [facility] Provider Note   CSN: [id number] Arrival date & time: 10/03/23  1451     History  Chief Complaint  Patient presents with   Emesis   Nausea   Diarrhea    Joan Wood is a 53 y.o. female with EtOH cirrhosis, h/o gastric ulcer status post ex lap for perforated gastric ulcer repair with omental patch on 7/27, recent  GIB, h/o pancreatitis, hypoNa/K, ongoing alcohol use disorder, multiple hospitalizations who presents with Patient BIB [facility]  of complaint of abdominal pain, nausea, vomiting and bloody diarrhea.  This began on Monday and has been associated with bright red bloody diarrhea that is happening greater than 20 times.  She is noted to have emesis in the emergency department that is coffee-ground in color and she is not sure if that is how it has been at home.  Patient does have a history of GI bleeding and gastric ulcers.  She endorses severe off the charts  all over abdominal pain that began also on Monday more severe, it is similar to pain she has had in the past but much more severe this time.  Also now radiates through to her back.  EMS Gave 4mg  of zofran  IV. CBG 114.  Per chart review was admitted from 08/31/2023 to 09/04/2023 in the setting of black tarry stool and nonbloody emesis, CT demonstrated persistent ulceration within gastric wall related to prior perforated ulcer with repair and resolution of previously seen free air.  Dr. Eartha 09/01/2023 recommended no endoscopy with twice daily IV PPI and sucralfate .  Patient also has ascites status post paracentesis with no signs of SBP as recently as 09/29/2023.   Past Medical History:  Diagnosis Date   Anxiety    Dysrhythmia    GERD (gastroesophageal reflux disease)    Hernia of abdominal wall    History of hiatal hernia    Sleep apnea    SVT (supraventricular tachycardia)        Home Medications Prior to Admission medications   Medication Sig Start Date End Date  Taking? Authorizing Provider  alum & mag hydroxide-simeth (MAALOX/MYLANTA) 200-200-20 MG/5ML suspension Take 15 mLs by mouth every 6 (six) hours as needed for indigestion or heartburn.   Yes [provider]  ondansetron  (ZOFRAN ) 4 MG tablet Take 1 tablet (4 mg total) by mouth every 8 (eight) hours as needed for nausea or vomiting. 09/17/23  Yes Rogelia Jerilynn RAMAN, MD  oxyCODONE -acetaminophen  (PERCOCET/ROXICET) 5-325 MG tablet Take 1 tablet by mouth every 6 (six) hours as needed for severe pain (pain score 7-10). 09/29/23  Yes Upstill, Margit, PA-C  sertraline  (ZOLOFT ) 50 MG tablet Take 1 tablet (50 mg total) by mouth daily. 08/22/23  Yes Pearlean Manus, MD  Elastic Bandages & Supports (FITRITE BACK BRACE WITH PULLEY) MISC 1 Units by Does not apply route daily. 05/25/23   Bevely Doffing, FNP  ondansetron  (ZOFRAN -ODT) 4 MG disintegrating tablet Take 1 tablet (4 mg total) by mouth every 8 (eight) hours as needed for nausea or vomiting. Patient not taking: Reported on 10/03/2023 09/29/23   Odell Margit, PA-C  potassium chloride  SA (KLOR-CON  M) 20 MEQ tablet Take 2 tablets (40 mEq total) by mouth daily for 2 doses. Patient not taking: Reported on 10/03/2023 09/17/23 09/19/23  Rogelia Jerilynn RAMAN, MD      Allergies    Bee venom, Fire ant, and Latex    Review of Systems   Review of Systems A 10 point review of  systems was performed and is negative unless otherwise reported in HPI.  Physical Exam Updated Vital Signs BP 98/60   Pulse (!) 113   Temp 98.7 F (37.1 C) (Oral)   Resp 15   Ht 5' 2 (1.575 m)   Wt 56.7 kg   SpO2 99%   BMI 22.86 kg/m  Physical Exam General: Uncomfortable appearing female, lying in bed.  HEENT: PERRLA, Sclera anicteric, MMM, trachea midline.  Cardiology: Regular tachycardic rate, no murmurs/rubs/gallops.  Resp: Normal respiratory rate and effort. CTAB, no wheezes, rhonchi, crackles.  Abd: Distended abdomen that is diffusely tender especially in the epigastric and  left upper quadrant regions.  No rebound tenderness or guarding.  GU: Deferred. MSK: No peripheral edema or signs of trauma. Extremities without deformity or TTP. No cyanosis or clubbing. Skin: warm, dry.  Neuro: A&Ox4, CNs II-XII grossly intact. MAEs. Sensation grossly intact.  Psych: Normal mood and affect.   ED Results / Procedures / Treatments   Labs (all labs ordered are listed, but only abnormal results are displayed) Labs Reviewed  CBC WITH DIFFERENTIAL/PLATELET - Abnormal; Notable for the following components:      Result Value   RBC 2.60 (*)    Hemoglobin 8.3 (*)    HCT 24.1 (*)    RDW 24.4 (*)    Platelets 83 (*)    Lymphs Abs 0.5 (*)    All other components within normal limits  PROTIME-INR - Abnormal; Notable for the following components:   Prothrombin Time 24.0 (*)    INR 2.0 (*)    All other components within normal limits  COMPREHENSIVE METABOLIC PANEL WITH GFR - Abnormal; Notable for the following components:   Sodium 123 (*)    Potassium 3.3 (*)    Chloride 86 (*)    CO2 21 (*)    BUN <5 (*)    Calcium  7.5 (*)    Total Protein 6.3 (*)    Albumin  1.8 (*)    AST 138 (*)    Total Bilirubin 5.6 (*)    Anion gap 16 (*)    All other components within normal limits  MAGNESIUM  - Abnormal; Notable for the following components:   Magnesium  1.4 (*)    All other components within normal limits  RESP PANEL BY RT-PCR (RSV, FLU A&B, COVID)  RVPGX2  LIPASE, BLOOD  URINALYSIS, ROUTINE W REFLEX MICROSCOPIC  BASIC METABOLIC PANEL WITH GFR  CBG MONITORING, ED  TYPE AND SCREEN    EKG EKG Interpretation Date/Time:  Wednesday October 03 2023 15:46:26 EDT Ventricular Rate:  123 PR Interval:  137 QRS Duration:  94 QT Interval:  327 QTC Calculation: 468 R Axis:   84  Text Interpretation: Sinus tachycardia Low voltage, precordial leads ST elevation in inferior leads with no reciprocal depressions Similar to prior Confirmed by Franklyn Gills [phone]) on 10/03/2023  4:00:15 PM  Radiology CT ANGIO GI BLEED Result Date: 10/03/2023 CLINICAL DATA:  GIB, h/o perforated gastric ulcer. Nausea, vomiting. EXAM: CTA ABDOMEN AND PELVIS WITHOUT AND WITH CONTRAST TECHNIQUE: Multidetector CT imaging of the abdomen and pelvis was performed using the standard protocol during bolus administration of intravenous contrast. Multiplanar reconstructed images and MIPs were obtained and reviewed to evaluate the vascular anatomy. RADIATION DOSE REDUCTION: This exam was performed according to the departmental dose-optimization program which includes automated exposure control, adjustment of the mA and/or kV according to patient size and/or use of iterative reconstruction technique. CONTRAST:  OMNIPAQUE  IOHEXOL  350 MG/ML SOLN COMPARISON:  09/29/2023 FINDINGS:  VASCULAR Aorta: Normal caliber aorta without aneurysm, dissection, vasculitis or significant stenosis. Aortic atherosclerosis. Celiac: Patent SMA: Patent Renals: Patent IMA: Patent Inflow: Patent without evidence of aneurysm, dissection, vasculitis or significant stenosis. Proximal Outflow: Bilateral common femoral and visualized portions of the superficial and profunda femoral arteries are patent without evidence of aneurysm, dissection, vasculitis or significant stenosis. Veins: No obvious venous abnormality within the limitations of this arterial phase study. Review of the MIP images confirms the above findings. NON-VASCULAR Lower chest: Distal esophageal wall thickening, new since prior study. No effusions. Hepatobiliary: Severe diffuse fatty infiltration. Changes of cirrhosis, stable. Gallbladder grossly unremarkable. Pancreas: No focal abnormality or ductal dilatation. Spleen: No focal abnormality.  Normal size. Adrenals/Urinary Tract: No adrenal abnormality. No focal renal abnormality. No stones or hydronephrosis. Urinary bladder is unremarkable. Stomach/Bowel: Stomach and small bowel decompressed. Large bowel unremarkable. No  contrast extravasation to localize GI bleed. No bowel obstruction or inflammatory process. Lymphatic: No adenopathy Reproductive: Uterus and adnexa unremarkable.  No mass. Other: Large volume ascites, stable. Musculoskeletal: No acute bony abnormality. IMPRESSION: VASCULAR Aortic atherosclerosis. No active extravasation of contrast to localize GI bleed. NON-VASCULAR Changes of cirrhosis and hepatic steatosis. Stable large volume ascites. Distal esophageal wall thickening, new since prior study. This could reflect esophagitis. Electronically Signed   By: Franky Crease M.D.   On: 10/03/2023 19:34    Procedures .Critical Care  Performed by: Franklyn Sid SAILOR, MD Authorized by: Franklyn Sid SAILOR, MD   Critical care provider statement:    Critical care time (minutes):  30   Critical care was time spent personally by me on the following activities:  Development of treatment plan with patient or surrogate, discussions with consultants, evaluation of patient's response to treatment, examination of patient, ordering and review of laboratory studies, ordering and review of radiographic studies, ordering and performing treatments and interventions, pulse oximetry, re-evaluation of patient's condition and review of old charts   Care discussed with: admitting provider       Medications Ordered in ED Medications  octreotide  (SANDOSTATIN ) 2 mcg/mL load via infusion 50 mcg (has no administration in time range)    And  octreotide  (SANDOSTATIN ) 500 mcg in sodium chloride  0.9 % 250 mL (2 mcg/mL) infusion (has no administration in time range)  cefTRIAXone  (ROCEPHIN ) 1 g in sodium chloride  0.9 % 100 mL IVPB (has no administration in time range)  lactated ringers  infusion (has no administration in time range)  pantoprazole  (PROTONIX ) injection 40 mg (40 mg Intravenous Given 10/03/23 2021)    Followed by  pantoprazole  (PROTONIX ) injection 40 mg (has no administration in time range)  phytonadione  (VITAMIN K ) 10 mg in  dextrose  5 % 50 mL IVPB (has no administration in time range)  lactated ringers  bolus 1,000 mL (1,000 mLs Intravenous New Bag/Given 10/03/23 1538)  metoCLOPramide  (REGLAN ) injection 10 mg (10 mg Intravenous Given 10/03/23 1556)  HYDROmorphone  (DILAUDID ) injection 0.5 mg (0.5 mg Intravenous Given 10/03/23 1601)  pantoprazole  (PROTONIX ) injection 40 mg (40 mg Intravenous Given 10/03/23 1611)  iohexol  (OMNIPAQUE ) 350 MG/ML injection 100 mL (100 mLs Intravenous Contrast Given 10/03/23 1905)  HYDROmorphone  (DILAUDID ) injection 0.5 mg (0.5 mg Intravenous Given 10/03/23 2002)  ondansetron  (ZOFRAN ) injection 4 mg (4 mg Intravenous Given 10/03/23 1958)    ED Course/ Medical Decision Making/ A&P                          Medical Decision Making Amount and/or Complexity of Data Reviewed Labs: ordered. Decision-making  details documented in ED Course. Radiology: ordered. Decision-making details documented in ED Course.  Risk Prescription drug management. Decision regarding hospitalization.    This patient presents to the ED for concern of N/V, abd pain, GIB, this involves an extensive number of treatment options, and is a complaint that carries with it a high risk of complications and morbidity.  I considered the following differential and admission for this acute, potentially life threatening condition.   MDM:    For DDX for GIB/ abdominal pain includes but is not limited to:  Abdominal exam without peritoneal signs. No evidence of acute abdomen at this time.  Consider GERD/gastritis/PUD/esophagitis, diverticulitis/diverticulosis, ischemic colitis, IBD, AVMs or Dieulafoy lesions, infectious colitis, malignancy. Does have cirrhosis but no known h/o varices at this time.  Given IV reglan , dilaudid , and protonix  as well as fluids. No h/o varices, will hold off on octreotide /ceftriaxone  for now. She is HDS.  Patient found to have decreased Hgb to 8.3 (dropped one point since 4 days ago), as well as acute on  chronic hyponatremia. Patient does continue to drink alcohol, this could be dilutional or hypovolemic given n/V decreased PO intake. Obtained CTA abdomen which showed her cirrhosis and likely new esophagitis. Given IV octreotide /ceftriaxone  at request of GI. Will admit to medicine.    Clinical Course as of 10/03/23 2058  Wed Oct 03, 2023  1559 Glucose-Capillary: 19 [HN]  1747 D/w neurologist who recommends STAT MRI.  [HN]  1812 WBC: 6.3 No leukocytosis  [HN]  1812 Hemoglobin(!): 8.3 Dropped nearly 1 point from 4 days ago - drawn prior to any fluid resuscitation [HN]  1812 INR(!): 2.0 [HN]  1826 Sodium(!): 123 Decreasing hyponatremia [HN]  1929 Platelets(!): 83 Similar to prior chronic thrombocytopenia [HN]  1959 CT ANGIO GI BLEED VASCULAR   Aortic atherosclerosis.   No active extravasation of contrast to localize GI bleed.   NON-VASCULAR   Changes of cirrhosis and hepatic steatosis. Stable large volume ascites.   Distal esophageal wall thickening, new since prior study. This could reflect esophagitis.  [HN]  1959 Consulted to GI [HN]  2010 Dr. Thomasina recommends IV octreotide  and ceftriaxone . Recommends restricting transfusion until Hgb <7.0.  [HN]  2057 Admitted to stepdown [HN]    Clinical Course User Index [HN] Franklyn Sid SAILOR, MD    Labs: I Ordered, and personally interpreted labs.  The pertinent results include: Those listed above  Imaging Studies ordered: I ordered imaging studies including CT angio GI bleed I independently visualized and interpreted imaging. I agree with the radiologist interpretation  Additional history obtained from chart review.    Cardiac Monitoring: The patient was maintained on a cardiac monitor.  I personally viewed and interpreted the cardiac monitored which showed an underlying rhythm of: Sinus tachycardia  Reevaluation: After the interventions noted above, I reevaluated the patient and found that they have :stayed the  same  Social Determinants of Health:  lives independently  Disposition:  Admit to medicine  Co morbidities that complicate the patient evaluation  Past Medical History:  Diagnosis Date   Anxiety    Dysrhythmia    GERD (gastroesophageal reflux disease)    Hernia of abdominal wall    History of hiatal hernia    Sleep apnea    SVT (supraventricular tachycardia)      Medicines Meds ordered this encounter  Medications   lactated ringers  bolus 1,000 mL   metoCLOPramide  (REGLAN ) injection 10 mg   DISCONTD: HYDROmorphone  (DILAUDID ) injection 1 mg   HYDROmorphone  (DILAUDID ) injection 0.5  mg   pantoprazole  (PROTONIX ) injection 40 mg   DISCONTD: iohexol  (OMNIPAQUE ) 300 MG/ML solution 100 mL   iohexol  (OMNIPAQUE ) 350 MG/ML injection 100 mL   HYDROmorphone  (DILAUDID ) injection 0.5 mg   ondansetron  (ZOFRAN ) injection 4 mg   AND Linked Order Group    octreotide  (SANDOSTATIN ) 2 mcg/mL load via infusion 50 mcg    octreotide  (SANDOSTATIN ) 500 mcg in sodium chloride  0.9 % 250 mL (2 mcg/mL) infusion   cefTRIAXone  (ROCEPHIN ) 1 g in sodium chloride  0.9 % 100 mL IVPB    Antibiotic Indication::   Intra-abdominal   lactated ringers  infusion   FOLLOWED BY Linked Order Group    pantoprazole  (PROTONIX ) injection 40 mg    pantoprazole  (PROTONIX ) injection 40 mg   phytonadione  (VITAMIN K ) 10 mg in dextrose  5 % 50 mL IVPB    I have reviewed the patients home medicines and have made adjustments as needed  Problem List / ED Course: Problem List Items Addressed This Visit       Digestive   Nausea vomiting and diarrhea - Primary   GI bleed     Other   Hyponatremia   Other Visit Diagnoses       Acute on chronic blood loss anemia                       This note was created using dictation software, which may contain spelling or grammatical errors.    Franklyn Sid SAILOR, MD 10/08/23 [phone]

## 2023-10-03 NOTE — ED Triage Notes (Signed)
 Patient BIB [facility]  of complaint of nausea, vomiting and diarrhea, hasn't eating well since Sunday. Takes chronic pain medication for back pain. Hx of SVT. EMS Gave 4mg  of zofran  IV. CBG 114.

## 2023-10-03 NOTE — H&P (Signed)
 [facility] H&P   Patient Demographics:    Joan Wood, is a 53 y.o. female  MRN: [id number]   DOB - August 15, 1970  Admit Date - 10/03/2023  Outpatient Primary MD for the patient is Zarwolo, Gloria, FNP  Referring MD/NP/PA: Dr Franklyn  Patient coming from: home  Chief Complaint  Patient presents with   Emesis   Nausea   Diarrhea      HPI:    Joan Wood  is a 53 y.o. female, medical history significant for s/p exploratory laparotomy for perforated gastric ulcer repair with omental patch on 7/27, ongoing alcohol use disorder, tobacco abuse, cirrhosis, HTN, GERD, anxiety/depression, multiple hospitalizations, (this is her seventh hospitalization in the last 5 months ). - Presents to ED secondary to complaints of nausea, vomiting and coffee-ground emesis, symptoms ongoing for last 2 days, she reports last alcoholic beverage she had Monday or Sunday, for nausea, vomiting and diarrhea has been ongoing for last 2 days, and it turned today into coffee-ground emesis which prompted her to come to ED - Workup in ED significant for low sodium of 123, potassium of 3.3, anion gap of 16, creatinine at 0.5, low magnesium  at 1.4, elevated AST at 138, and total bilirubin at 5.6, and is downtrending, was 11.4 earlier this month, currently it is 8.3, CT abdomen pelvis significant for possible esophagitis, and portal  colopathy, ED discussed with GI who recommended octreotide  drip, Protonix , and admission to hospitalist.   Review of systems:     A full 10 point Review of Systems was done, except as stated above, all other Review of Systems were negative.   With Past History of the following :    Past Medical History:  Diagnosis Date   Anxiety    Dysrhythmia    GERD (gastroesophageal reflux disease)    Hernia of abdominal wall    History of hiatal hernia    Sleep apnea    SVT (supraventricular  tachycardia)       Past Surgical History:  Procedure Laterality Date   ESOPHAGOGASTRODUODENOSCOPY N/A 05/17/2023   Procedure: EGD (ESOPHAGOGASTRODUODENOSCOPY);  Surgeon: Cindie Carlin POUR, DO;  [facility];  Service: Endoscopy;  Laterality: N/A;   FINGER FRACTURE SURGERY     FRACTURE SURGERY     GASTRORRHAPHY N/A 08/05/2023   Procedure: GASTRORRHAPHY;  Surgeon: Kallie Manuelita BROCKS, MD;  Location: [facility];  Service: General;  Laterality: N/A;   LAPAROTOMY N/A 08/05/2023   Procedure: LAPAROTOMY, EXPLORATORY;  Surgeon: Kallie Manuelita BROCKS, MD;  Location: [facility];  Service: General;  Laterality: N/A;   LIVER BIOPSY N/A 08/05/2023   Procedure: BIOPSY, LIVER;  Surgeon: Kallie Manuelita BROCKS, MD;  Location: [facility];  Service: General;  Laterality: N/A;   right ankle repair     age 16   UMBILICAL HERNIA REPAIR N/A 08/05/2023   Procedure: REPAIR, HERNIA, UMBILICAL, ADULT;  Surgeon: Kallie Manuelita BROCKS, MD;  Location: [facility];  Service: General;  Laterality: N/A;      Social History:     Social History   Tobacco Use   Smoking status: Every Day    Current packs/day: 0.50    Average packs/day: 0.7 packs/day for 38.6 years (28.5 ttl pk-yrs)    Types: Cigarettes    Start date: 03/2022   Smokeless tobacco: Never  Substance Use Topics   Alcohol use: Yes    Alcohol/week: 2.0 standard drinks of alcohol    Types: 2 Cans of beer per week    Comment: beer,2-3 beers in a weeks time        Family History :     Family History  Problem Relation Age of Onset   Cancer Mother    Leukemia Mother    Multiple myeloma Mother    Cancer Father        unsure what kind   Cancer - Colon Neg Hx    Colon polyps Neg Hx      Home Medications:   Prior to Admission medications   Medication Sig Start Date End Date Taking? Authorizing Provider  alum & mag hydroxide-simeth (MAALOX/MYLANTA) 200-200-20 MG/5ML suspension Take 15 mLs by mouth every 6 (six) hours as needed for indigestion or heartburn.    Yes [provider]  ondansetron  (ZOFRAN ) 4 MG tablet Take 1 tablet (4 mg total) by mouth every 8 (eight) hours as needed for nausea or vomiting. 09/17/23  Yes Rogelia Jerilynn RAMAN, MD  oxyCODONE -acetaminophen  (PERCOCET/ROXICET) 5-325 MG tablet Take 1 tablet by mouth every 6 (six) hours as needed for severe pain (pain score 7-10). 09/29/23  Yes Upstill, Margit, PA-C  sertraline  (ZOLOFT ) 50 MG tablet Take 1 tablet (50 mg total) by mouth daily. 08/22/23  Yes Pearlean Manus, MD  Elastic Bandages & Supports (FITRITE BACK BRACE WITH PULLEY) MISC 1 Units by Does not apply route daily. 05/25/23   Bevely Doffing, FNP  ondansetron  (ZOFRAN -ODT) 4 MG disintegrating tablet Take 1 tablet (4 mg total) by mouth every 8 (eight) hours as needed for nausea or vomiting. Patient not taking: Reported on 10/03/2023 09/29/23   Odell Margit, PA-C  potassium chloride  SA (KLOR-CON  M) 20 MEQ tablet Take 2 tablets (40 mEq total) by mouth daily for 2 doses. Patient not taking: Reported on 10/03/2023 09/17/23 09/19/23  Rogelia Jerilynn RAMAN, MD     Allergies:     Allergies  Allergen Reactions   Bee Venom Anaphylaxis    Cannot take epi for this allergy d/t heart condition per pt   Fire Ant Hives and Rash   Latex Rash    Latex band aids, gloves don't bother her     Physical Exam:   Vitals  Blood pressure 105/64, pulse (!) 108, temperature 98.7 F (37.1 C), temperature source Oral, resp. rate 17, height 5' 2 (1.575 m), weight 56.7 kg, SpO2 98%.   1. General Frail, chronically ill-appearing female, appears older than stated age  45. Normal affect and insight, Not Suicidal or Homicidal, Awake Alert, Oriented X 3.  3. No F.N deficits, ALL C.Nerves Intact, Strength 5/5 all 4 extremities, Sensation intact all 4 extremities, Plantars down going.  4. Ears and Eyes appear Normal, Conjunctivae clear, PERRLA. Moist Oral Mucosa.  5. Supple Neck, No JVD, No cervical lymphadenopathy appriciated, No Carotid Bruits.  6.  Symmetrical Chest wall movement, Good air movement bilaterally, CTAB.  7. RRR, No Gallops, Rubs or Murmurs, No Parasternal Heave.  8. Positive Bowel Sounds, at this present, mildly  distended, mild tenderness to palpation  9.  No Cyanosis, Normal Skin Turgor, No Skin Rash or Bruise.  10. Good muscle tone,  joints appear normal , no effusions, Normal ROM.    Data Review:    CBC Recent Labs  Lab 09/29/23 1417 10/03/23 1517  WBC 4.5 6.3  HGB 9.1* 8.3*  HCT 26.6* 24.1*  PLT 93* 83*  MCV 89.6 92.7  MCH 30.6 31.9  MCHC 34.2 34.4  RDW 23.0* 24.4*  LYMPHSABS  --  0.5*  MONOABS  --  0.8  EOSABS  --  0.0  BASOSABS  --  0.0   ------------------------------------------------------------------------------------------------------------------  Chemistries  Recent Labs  Lab 09/29/23 1417 09/29/23 1537 10/03/23 1713  NA 125*  --  123*  K 3.5  --  3.3*  CL 92*  --  86*  CO2 20*  --  21*  GLUCOSE 84  --  94  BUN <5*  --  <5*  CREATININE 0.41*  --  0.50  CALCIUM  7.5*  --  7.5*  MG  --  1.6* 1.4*  AST 92*  --  138*  ALT 21  --  32  ALKPHOS 70  --  74  BILITOT 3.0*  --  5.6*   ------------------------------------------------------------------------------------------------------------------ estimated creatinine clearance is 63.6 mL/min (by C-G formula based on SCr of 0.5 mg/dL). ------------------------------------------------------------------------------------------------------------------ No results for input(s): TSH, T4TOTAL, T3FREE, THYROIDAB in the last 72 hours.  Invalid input(s): FREET3  Coagulation profile Recent Labs  Lab 09/29/23 1523 10/03/23 1517  INR 1.6* 2.0*   ------------------------------------------------------------------------------------------------------------------- No results for input(s): DDIMER in the last 72  hours. -------------------------------------------------------------------------------------------------------------------  Cardiac Enzymes No results for input(s): CKMB, TROPONINI, MYOGLOBIN in the last 168 hours.  Invalid input(s): CK ------------------------------------------------------------------------------------------------------------------ No results found for: BNP   ---------------------------------------------------------------------------------------------------------------  Urinalysis    Component Value Date/Time   COLORURINE AMBER (A) 10/03/2023 2015   APPEARANCEUR CLEAR 10/03/2023 2015   LABSPEC >1.046 (H) 10/03/2023 2015   PHURINE 6.0 10/03/2023 2015   GLUCOSEU NEGATIVE 10/03/2023 2015   HGBUR MODERATE (A) 10/03/2023 2015   BILIRUBINUR SMALL (A) 10/03/2023 2015   KETONESUR 20 (A) 10/03/2023 2015   PROTEINUR NEGATIVE 10/03/2023 2015   NITRITE NEGATIVE 10/03/2023 2015   LEUKOCYTESUR NEGATIVE 10/03/2023 2015    ----------------------------------------------------------------------------------------------------------------   Imaging Results:    CT ANGIO GI BLEED Result Date: 10/03/2023 CLINICAL DATA:  GIB, h/o perforated gastric ulcer. Nausea, vomiting. EXAM: CTA ABDOMEN AND PELVIS WITHOUT AND WITH CONTRAST TECHNIQUE: Multidetector CT imaging of the abdomen and pelvis was performed using the standard protocol during bolus administration of intravenous contrast. Multiplanar reconstructed images and MIPs were obtained and reviewed to evaluate the vascular anatomy. RADIATION DOSE REDUCTION: This exam was performed according to the departmental dose-optimization program which includes automated exposure control, adjustment of the mA and/or kV according to patient size and/or use of iterative reconstruction technique. CONTRAST:  OMNIPAQUE  IOHEXOL  350 MG/ML SOLN COMPARISON:  09/29/2023 FINDINGS: VASCULAR Aorta: Normal caliber aorta without aneurysm,  dissection, vasculitis or significant stenosis. Aortic atherosclerosis. Celiac: Patent SMA: Patent Renals: Patent IMA: Patent Inflow: Patent without evidence of aneurysm, dissection, vasculitis or significant stenosis. Proximal Outflow: Bilateral common femoral and visualized portions of the superficial and profunda femoral arteries are patent without evidence of aneurysm, dissection, vasculitis or significant stenosis. Veins: No obvious venous abnormality within the limitations of this arterial phase study. Review of the MIP images confirms the above findings. NON-VASCULAR Lower chest: Distal esophageal wall thickening, new since prior study. No effusions. Hepatobiliary: Severe  diffuse fatty infiltration. Changes of cirrhosis, stable. Gallbladder grossly unremarkable. Pancreas: No focal abnormality or ductal dilatation. Spleen: No focal abnormality.  Normal size. Adrenals/Urinary Tract: No adrenal abnormality. No focal renal abnormality. No stones or hydronephrosis. Urinary bladder is unremarkable. Stomach/Bowel: Stomach and small bowel decompressed. Large bowel unremarkable. No contrast extravasation to localize GI bleed. No bowel obstruction or inflammatory process. Lymphatic: No adenopathy Reproductive: Uterus and adnexa unremarkable.  No mass. Other: Large volume ascites, stable. Musculoskeletal: No acute bony abnormality. IMPRESSION: VASCULAR Aortic atherosclerosis. No active extravasation of contrast to localize GI bleed. NON-VASCULAR Changes of cirrhosis and hepatic steatosis. Stable large volume ascites. Distal esophageal wall thickening, new since prior study. This could reflect esophagitis. Electronically Signed   By: Franky Crease M.D.   On: 10/03/2023 19:34      Assessment & Plan:    Principal Problem:   GI bleed Active Problems:   Tobacco abuse   Alcohol use disorder   Hyponatremia   Nausea vomiting and diarrhea   Liver cirrhosis (HCC)  Coffee-ground emesis Abd  pain/nausea/vomiting/diarrhea History of peptic ulcer disease with known recent gastric ulcer perforation - Concern for upper GI bleed, with her known history of gastric ulcers - GI consulted by ED, to evaluate in a.m.SABRA - Continue with IV Protonix  (concern for esophagitis on imaging) - Keep n.p.o. 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   liver cirrhosis Transaminitis Hyperbilirubinemia Coagulopathy Thrombocytopenia -Continue with octreotide  due to concern for GI bleed -recieved IV vitamin K  - GI consulted  Ascites -will request ultrasound paracentesis, will keep empirically on Rocephin  until SBP is ruled out    EtOH abuse -He was counseled at length, continue with CIWA protocol  Tobacco use - She was counseled  Acute on chronic hyponatremia -Concern for beer potomania -Continue with IV fluids  Hypokalemia Hypomagnesemia -Replaced     DVT Prophylaxis  SCDs   AM Labs Ordered, also please review Full Orders  Family Communication: Admission, patients condition and plan of care including tests being ordered have been discussed with the patient who indicate understanding and agree with the plan and Code Status.  Code Status Full  Likely DC to  home  Consults called: GI    Admission status: inpatient    Time spent in minutes : 70 minutes   Brayton Lye M.D on 10/03/2023 at 9:55 PM   [facility] - Office  [phone]

## 2023-10-04 ENCOUNTER — Inpatient Hospital Stay (HOSPITAL_COMMUNITY): Admitting: Anesthesiology

## 2023-10-04 ENCOUNTER — Encounter (HOSPITAL_COMMUNITY): Payer: Self-pay | Admitting: Internal Medicine

## 2023-10-04 ENCOUNTER — Encounter (HOSPITAL_COMMUNITY): Admission: EM | Payer: Self-pay | Source: Home / Self Care | Attending: Internal Medicine

## 2023-10-04 ENCOUNTER — Inpatient Hospital Stay (HOSPITAL_COMMUNITY)

## 2023-10-04 DIAGNOSIS — Z720 Tobacco use: Secondary | ICD-10-CM

## 2023-10-04 DIAGNOSIS — K625 Hemorrhage of anus and rectum: Secondary | ICD-10-CM | POA: Diagnosis not present

## 2023-10-04 DIAGNOSIS — K449 Diaphragmatic hernia without obstruction or gangrene: Secondary | ICD-10-CM | POA: Diagnosis not present

## 2023-10-04 DIAGNOSIS — F418 Other specified anxiety disorders: Secondary | ICD-10-CM | POA: Diagnosis not present

## 2023-10-04 DIAGNOSIS — K2289 Other specified disease of esophagus: Secondary | ICD-10-CM

## 2023-10-04 DIAGNOSIS — K269 Duodenal ulcer, unspecified as acute or chronic, without hemorrhage or perforation: Secondary | ICD-10-CM

## 2023-10-04 DIAGNOSIS — D62 Acute posthemorrhagic anemia: Secondary | ICD-10-CM | POA: Diagnosis not present

## 2023-10-04 DIAGNOSIS — K3189 Other diseases of stomach and duodenum: Secondary | ICD-10-CM | POA: Diagnosis not present

## 2023-10-04 DIAGNOSIS — K921 Melena: Secondary | ICD-10-CM | POA: Diagnosis not present

## 2023-10-04 DIAGNOSIS — K254 Chronic or unspecified gastric ulcer with hemorrhage: Secondary | ICD-10-CM

## 2023-10-04 DIAGNOSIS — R197 Diarrhea, unspecified: Secondary | ICD-10-CM | POA: Diagnosis not present

## 2023-10-04 DIAGNOSIS — K922 Gastrointestinal hemorrhage, unspecified: Secondary | ICD-10-CM | POA: Diagnosis not present

## 2023-10-04 DIAGNOSIS — K920 Hematemesis: Secondary | ICD-10-CM | POA: Diagnosis not present

## 2023-10-04 DIAGNOSIS — K7011 Alcoholic hepatitis with ascites: Secondary | ICD-10-CM | POA: Diagnosis not present

## 2023-10-04 DIAGNOSIS — K7031 Alcoholic cirrhosis of liver with ascites: Secondary | ICD-10-CM | POA: Diagnosis not present

## 2023-10-04 DIAGNOSIS — K766 Portal hypertension: Secondary | ICD-10-CM | POA: Diagnosis not present

## 2023-10-04 DIAGNOSIS — K259 Gastric ulcer, unspecified as acute or chronic, without hemorrhage or perforation: Secondary | ICD-10-CM | POA: Diagnosis not present

## 2023-10-04 DIAGNOSIS — F1090 Alcohol use, unspecified, uncomplicated: Secondary | ICD-10-CM | POA: Diagnosis not present

## 2023-10-04 DIAGNOSIS — E871 Hypo-osmolality and hyponatremia: Secondary | ICD-10-CM | POA: Diagnosis not present

## 2023-10-04 DIAGNOSIS — K209 Esophagitis, unspecified without bleeding: Secondary | ICD-10-CM

## 2023-10-04 DIAGNOSIS — R112 Nausea with vomiting, unspecified: Secondary | ICD-10-CM

## 2023-10-04 DIAGNOSIS — R109 Unspecified abdominal pain: Secondary | ICD-10-CM

## 2023-10-04 HISTORY — PX: ESOPHAGOGASTRODUODENOSCOPY: SHX5428

## 2023-10-04 LAB — BODY FLUID CELL COUNT WITH DIFFERENTIAL
Eos, Fluid: 0 %
Lymphs, Fluid: 31 %
Monocyte-Macrophage-Serous Fluid: 38 % — ABNORMAL LOW (ref 50–90)
Neutrophil Count, Fluid: 31 % — ABNORMAL HIGH (ref 0–25)
Total Nucleated Cell Count, Fluid: 93 uL (ref 0–1000)

## 2023-10-04 LAB — PREPARE RBC (CROSSMATCH)

## 2023-10-04 LAB — COMPREHENSIVE METABOLIC PANEL WITH GFR
ALT: 29 U/L (ref 0–44)
AST: 123 U/L — ABNORMAL HIGH (ref 15–41)
Albumin: 1.7 g/dL — ABNORMAL LOW (ref 3.5–5.0)
Alkaline Phosphatase: 69 U/L (ref 38–126)
Anion gap: 10 (ref 5–15)
BUN: <5 mg/dL — ABNORMAL LOW (ref 6–20)
CO2: 23 mmol/L (ref 22–32)
Calcium: 7.5 mg/dL — ABNORMAL LOW (ref 8.9–10.3)
Chloride: 90 mmol/L — ABNORMAL LOW (ref 98–111)
Creatinine, Ser: 0.44 mg/dL (ref 0.44–1.00)
GFR, Estimated: >60 mL/min (ref >60)
Glucose, Bld: 90 mg/dL (ref 70–99)
Potassium: 4.2 mmol/L (ref 3.5–5.1)
Sodium: 123 mmol/L — ABNORMAL LOW (ref 135–145)
Total Bilirubin: 4.8 mg/dL — ABNORMAL HIGH (ref 0.0–1.2)
Total Protein: 6.2 g/dL — ABNORMAL LOW (ref 6.5–8.1)

## 2023-10-04 LAB — HEMOGLOBIN AND HEMATOCRIT, BLOOD
HCT: 24.9 % — ABNORMAL LOW (ref 36.0–46.0)
HCT: 25.6 % — ABNORMAL LOW (ref 36.0–46.0)
HCT: 26.8 % — ABNORMAL LOW (ref 36.0–46.0)
Hemoglobin: 8.5 g/dL — ABNORMAL LOW (ref 12.0–15.0)
Hemoglobin: 8.6 g/dL — ABNORMAL LOW (ref 12.0–15.0)
Hemoglobin: 9.1 g/dL — ABNORMAL LOW (ref 12.0–15.0)

## 2023-10-04 LAB — CBC
HCT: 20.4 % — ABNORMAL LOW (ref 36.0–46.0)
Hemoglobin: 7 g/dL — ABNORMAL LOW (ref 12.0–15.0)
MCH: 32.4 pg (ref 26.0–34.0)
MCHC: 34.3 g/dL (ref 30.0–36.0)
MCV: 94.4 fL (ref 80.0–100.0)
Platelets: 70 K/uL — ABNORMAL LOW (ref 150–400)
RBC: 2.16 MIL/uL — ABNORMAL LOW (ref 3.87–5.11)
RDW: 24.9 % — ABNORMAL HIGH (ref 11.5–15.5)
WBC: 6.9 K/uL (ref 4.0–10.5)
nRBC: 0 % (ref 0.0–0.2)

## 2023-10-04 LAB — PROTIME-INR
INR: 2.2 — ABNORMAL HIGH (ref 0.8–1.2)
Prothrombin Time: 25.6 s — ABNORMAL HIGH (ref 11.4–15.2)

## 2023-10-04 LAB — PROTEIN, PLEURAL OR PERITONEAL FLUID: Total protein, fluid: <3 g/dL

## 2023-10-04 LAB — TYPE AND SCREEN
ABO/RH(D): A POS
Antibody Screen: NEGATIVE

## 2023-10-04 LAB — ALBUMIN, PLEURAL OR PERITONEAL FLUID: Albumin, Fluid: <1.5 g/dL

## 2023-10-04 LAB — GLUCOSE, CAPILLARY
Glucose-Capillary: 86 mg/dL (ref 70–99)
Glucose-Capillary: 86 mg/dL (ref 70–99)
Glucose-Capillary: 91 mg/dL (ref 70–99)

## 2023-10-04 LAB — MRSA NEXT GEN BY PCR, NASAL: MRSA by PCR Next Gen: NOT DETECTED

## 2023-10-04 LAB — AMMONIA: Ammonia: 22 umol/L (ref 9–35)

## 2023-10-04 LAB — MAGNESIUM: Magnesium: 2.1 mg/dL (ref 1.7–2.4)

## 2023-10-04 MED ORDER — ORAL CARE MOUTH RINSE: 15.0000 mL | OROMUCOSAL | Status: AC | PRN

## 2023-10-04 MED ORDER — PROPOFOL 10 MG/ML IV BOLUS
INTRAVENOUS | Status: DC | PRN
Start: 2023-10-04 — End: 2023-10-04
  Administered 2023-10-04: 20 mg via INTRAVENOUS

## 2023-10-04 MED ORDER — SODIUM CHLORIDE 0.9% IV SOLUTION: Freq: Once | INTRAVENOUS | Status: DC

## 2023-10-04 MED ORDER — SODIUM CHLORIDE 0.9 % IV SOLN: INTRAVENOUS | Status: DC

## 2023-10-04 MED ORDER — SODIUM CHLORIDE 0.9% IV SOLUTION: Freq: Once | INTRAVENOUS | Status: AC

## 2023-10-04 MED ORDER — PHENYLEPHRINE HCL (PRESSORS) 10 MG/ML IV SOLN
INTRAVENOUS | Status: DC | PRN
  Administered 2023-10-04 (×2): 80 ug via INTRAVENOUS

## 2023-10-04 MED ORDER — KETAMINE HCL 50 MG/5ML IJ SOSY
PREFILLED_SYRINGE | INTRAMUSCULAR | Status: DC | PRN
  Administered 2023-10-04: 20 mg via INTRAVENOUS

## 2023-10-04 MED ORDER — ONDANSETRON HCL 4 MG/2ML IJ SOLN
INTRAMUSCULAR | Status: DC | PRN
  Administered 2023-10-04: 4 mg via INTRAVENOUS

## 2023-10-04 MED ORDER — KETAMINE HCL 50 MG/5ML IJ SOSY
PREFILLED_SYRINGE | INTRAMUSCULAR | Status: AC
  Filled 2023-10-04: qty 5

## 2023-10-04 MED ORDER — LACTATED RINGERS IV SOLN: INTRAVENOUS | Status: DC | PRN

## 2023-10-04 MED ORDER — LIDOCAINE 2% (20 MG/ML) 5 ML SYRINGE
INTRAMUSCULAR | Status: DC | PRN
  Administered 2023-10-04: 80 mg via INTRAVENOUS

## 2023-10-04 MED ORDER — FENTANYL CITRATE PF 50 MCG/ML IJ SOSY
25.0000 ug | PREFILLED_SYRINGE | INTRAMUSCULAR | Status: DC | PRN
  Administered 2023-10-04 – 2023-10-06 (×11): 25 ug via INTRAVENOUS
  Filled 2023-10-04 (×11): qty 1

## 2023-10-04 MED ORDER — PROPOFOL 500 MG/50ML IV EMUL
INTRAVENOUS | Status: DC | PRN
  Administered 2023-10-04: 100 ug/kg/min via INTRAVENOUS

## 2023-10-04 MED ORDER — PROMETHAZINE (PHENERGAN) 6.25MG IN NS 50ML IVPB
6.2500 mg | Freq: Four times a day (QID) | INTRAVENOUS | Status: DC | PRN
  Administered 2023-10-04 – 2023-10-09 (×7): 6.25 mg via INTRAVENOUS
  Filled 2023-10-04 (×3): qty 50
  Filled 2023-10-04 (×6): qty 6.25

## 2023-10-04 NOTE — Plan of Care (Signed)
  Problem: Activity: Goal: Risk for activity intolerance will decrease Outcome: Progressing   Problem: Coping: Goal: Level of anxiety will decrease Outcome: Progressing   Problem: Pain Managment: Goal: General experience of comfort will improve and/or be controlled Outcome: Progressing   Problem: Safety: Goal: Ability to remain free from injury will improve Outcome: Progressing

## 2023-10-04 NOTE — Consult Note (Signed)
 NAME:  RHENDA OREGON, MRN:  [id number], DOB:  [birth year], LOS: 1 ADMISSION DATE:  10/03/2023, CONSULTATION DATE:  10/04/2023 REFERRING MD: Ricky READ CHIEF COMPLAINT:  GI bleed    History of Present Illness:  53 year old female with past medical history of GERD, PUD with recent gastric ulcer perforation s/p ex lap, pancreatitis, sleep apnea, tobacco use, alcohol use disorder, decompensated cirrhosis who presented to [facility] ED on 9/24 with nausea, vomiting and bloody diarrhea beginning 9/22. While in the ED, noted to have coffee-ground emesis. Labs in ED notable for Hgb 8.3, plt 83, PT/INR 24/2, Na 123, K 3.3, bicarb 21, albumin  1.8, AST 138, tbili 5.6, AG 16, lipase negative, mag 1.4. CT angio GI showed no acute bleed, large volume ascites, distal esophageal thickening, new. She was given PPI, started on octreotide  gtt, rocephin , vitamin K , pain and nausea control and admitted to hospitalist. GI was consulted to evaluate 9/25 AM. Hgb with drop from 8.3 >7 and 1 U PRBC ordered. GI saw patient and proceeded with upper endoscopy showing black discoloration of esophageal mucosa concerning for acute esophageal necrosis, portal hypertension, non-bleeding gastric and duodenal ulcers. GI recommended transfer to [facility].   Pertinent  Medical History  GERD, PUD with recent gastric ulcer perforation s/p ex lap, pancreatitis, sleep apnea, tobacco use, alcohol use disorder, decompensated cirrhosis  Significant Hospital Events: Including procedures, antibiotic start and stop dates in addition to other pertinent events   9/24: ED for upper GI bleed admit to hospitalist  9/25: 1 U PRBC, GI upper endoscopy w/ acute esophageal necrosis with recommendation for transfer to [facility]   Interim History / Subjective:  Complaining of abdominal pain and nausea.   Objective   Blood pressure 96/63, pulse 95, temperature 98.2 F (36.8 C), resp. rate 16, height 5' 2 (1.575 m), weight 62.4 kg, SpO2 94%.        Intake/Output Summary  (Last 24 hours) at 10/04/2023 1324 Last data filed at 10/04/2023 1027 Gross per 24 hour  Intake 2417.95 ml  Output --  Net 2417.95 ml   Filed Weights   10/03/23 1501 10/04/23 0200 10/04/23 1048  Weight: 56.7 kg 62.4 kg 62.4 kg    Examination: General: appears older than stated age, acute on chronically ill appearing  HENT: mild scleral icterus, perrla, ncat, mm dry  Lungs: clear bilaterally, diminished bases, room air  Cardiovascular: s1s2 sinus rhythm to sinus tachy, no murmur, no edema Abdomen: protuberant, ascites present, tenderness to percussion  Extremities: no edema Neuro: awake, alert, oriented  GU: no foley   Resolved Hospital Problem list    Assessment & Plan:  Acute upper GI Bleed  Concern for acute esophageal necrosis  Acute blood loss anemia  PUD with recent gastric ulcer perforation s/p ex lap Presents with 4 days of bloody diarrhea and coffee-ground emesis. EGD on 9/25 with concern for acute esophageal necrosis without perforation or active bleed.  - consult GI here at [facility], appreciate assistance - Amy Esterwood, PA-C with GI aware and will see in AM - con't octreotide  gtt  - ppi bid  - con't rocephin   - cbc q6h, transfuse for >7  - LR @ 75cc/hr  - zofran  for nausea control   Decompensated alcoholic liver cirrhosis - MELD 30 Coagulopathy  Portal gastropathy  Thrombocytopenia  Hyperbilirubinemia  History of pancreatitis  States last drink was 2 weeks ago. No withdrawal symptoms at present. Lipase normal. Received vitamin K  yesterday in ED for reversed.  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  Neurologically intact.  - trend  Tobacco use  - nicotine  patch  - counsel on cessation   Labs   CBC: Recent Labs  Lab 09/29/23 1417 10/03/23 1517 10/04/23 0307 10/04/23 1258  WBC 4.5 6.3 6.9  --   NEUTROABS  --  5.0  --   --   HGB 9.1* 8.3* 7.0* 8.6*  HCT 26.6* 24.1* 20.4* 25.6*  MCV 89.6 92.7 94.4  --   PLT 93* 83* 70*  --     Basic Metabolic Panel: Recent Labs  Lab 09/29/23 1417 09/29/23 1537 10/03/23 1713 10/03/23 2135 10/04/23 0307  NA 125*  --  123*  --  123*  K 3.5  --  3.3*  --  4.2  CL 92*  --  86*  --  90*  CO2 20*  --  21*  --  23  GLUCOSE 84  --  94  --  90  BUN <5*  --  <5*  --  <5*  CREATININE 0.41*  --  0.50  --  0.44  CALCIUM  7.5*  --  7.5*  --  7.5*  MG  --  1.6* 1.4*  --   --   PHOS  --   --   --  3.0  --    GFR: Estimated Creatinine Clearance: 69.8 mL/min (by C-G formula based on SCr of 0.44 mg/dL). Recent Labs  Lab 09/29/23 1417 10/03/23 1517 10/04/23 0307  WBC 4.5 6.3 6.9    Liver Function Tests: Recent Labs  Lab 09/29/23 1417 10/03/23 1713 10/04/23 0307  AST 92* 138* 123*  ALT 21 32 29  ALKPHOS 70 74 69  BILITOT 3.0* 5.6* 4.8*  PROT 6.7 6.3* 6.2*  ALBUMIN  1.9* 1.8* 1.7*   Recent Labs  Lab 10/03/23 1713  LIPASE 36   No results for input(s): AMMONIA in the last 168 hours.  ABG    Component Value Date/Time   TCO2 26 08/31/2023 2240     Coagulation Profile: Recent Labs  Lab 09/29/23 1523 10/03/23 1517 10/04/23 0307  INR 1.6* 2.0* 2.2*    Cardiac Enzymes: No results for input(s): CKTOTAL, CKMB, CKMBINDEX, TROPONINI in the last 168 hours.  HbA1C: Hgb A1c MFr Bld  Date/Time Value Ref Range Status  07/30/2023 09:39 AM 4.5 (L) 4.8 - 5.6 % Final    Comment:             Prediabetes: 5.7 - 6.4          Diabetes: >6.4          Glycemic control for adults with diabetes: <7.0   04/03/2023 10:41 AM 4.7 (L) 4.8 - 5.6 % Final    Comment:             Prediabetes: 5.7 - 6.4          Diabetes: >6.4           Glycemic control for adults with diabetes: <7.0     CBG: Recent Labs  Lab 10/03/23 1553 10/03/23 2255 10/04/23 0730  GLUCAP 98 98 86    Review of Systems:   As  above  Past Medical History:  She,  has a past medical history of Anxiety, Dysrhythmia, GERD (gastroesophageal reflux disease), Hernia of abdominal wall, History of hiatal hernia, Sleep apnea, and SVT (supraventricular tachycardia).   Surgical History:   Past Surgical History:  Procedure Laterality Date   ESOPHAGOGASTRODUODENOSCOPY N/A 05/17/2023   Procedure: EGD (ESOPHAGOGASTRODUODENOSCOPY);  Surgeon: Cindie Carlin POUR, DO;  [facility]: [facility];  Service: Endoscopy;  Laterality: N/A;   FINGER FRACTURE SURGERY     FRACTURE SURGERY     GASTRORRHAPHY N/A 08/05/2023   Procedure: GASTRORRHAPHY;  Surgeon: Kallie Manuelita BROCKS, MD;  Location: [facility];  Service: General;  Laterality: N/A;   LAPAROTOMY N/A 08/05/2023   Procedure: LAPAROTOMY, EXPLORATORY;  Surgeon: Kallie Manuelita BROCKS, MD;  Location: [facility];  Service: General;  Laterality: N/A;   LIVER BIOPSY N/A 08/05/2023   Procedure: BIOPSY, LIVER;  Surgeon: Kallie Manuelita BROCKS, MD;  Location: [facility];  Service: General;  Laterality: N/A;   right ankle repair     age 39   UMBILICAL HERNIA REPAIR N/A 08/05/2023   Procedure: REPAIR, HERNIA, UMBILICAL, ADULT;  Surgeon: Kallie Manuelita BROCKS, MD;  Location: [facility];  Service: General;  Laterality: N/A;     Social History:   reports that she has been smoking cigarettes. She started smoking about 18 months ago. She has a 28.5 pack-year smoking history. She has never used smokeless tobacco. She reports current alcohol use of about 2.0 standard drinks of alcohol per week. She reports that she does not use drugs.   Family History:  Her family history includes Cancer in her father and mother; Leukemia in her mother; Multiple myeloma in her mother. There is no history of Cancer - Colon or Colon polyps.   Allergies Allergies  Allergen  Reactions   Bee Venom Anaphylaxis    Cannot take epi for this allergy d/t heart condition per pt   Fire Ant Hives and Rash   Latex Rash    Latex band aids, gloves don't bother her     Home Medications  Prior to Admission medications   Medication Sig Start Date End Date Taking? Authorizing Provider  alum & mag hydroxide-simeth (MAALOX/MYLANTA) 200-200-20 MG/5ML suspension Take 15 mLs by mouth every 6 (six) hours as needed for indigestion or heartburn.   Yes [provider]  ondansetron  (ZOFRAN ) 4 MG tablet Take 1 tablet (4 mg total) by mouth every 8 (eight) hours as needed for nausea or vomiting. 09/17/23  Yes Rogelia Jerilynn RAMAN, MD  oxyCODONE -acetaminophen  (PERCOCET/ROXICET) 5-325 MG tablet Take 1 tablet by mouth every 6 (six) hours as needed for severe pain (pain score 7-10). 09/29/23  Yes Upstill, Margit, PA-C  sertraline  (ZOLOFT ) 50 MG tablet Take 1 tablet (50 mg total) by mouth daily. 08/22/23  Yes Pearlean Manus, MD  Elastic Bandages & Supports (FITRITE BACK BRACE WITH PULLEY) MISC 1 Units by Does not apply route daily. 05/25/23   Bevely Doffing, FNP  ondansetron  (ZOFRAN -ODT) 4 MG disintegrating tablet Take 1 tablet (4 mg total) by mouth every 8 (eight) hours as needed for nausea or vomiting. Patient not taking: Reported on 10/03/2023 09/29/23   Odell Margit, PA-C  potassium chloride  SA (KLOR-CON  M) 20 MEQ tablet Take 2 tablets (40 mEq total) by mouth daily for 2 doses. Patient not taking: Reported on 10/03/2023 09/17/23 09/19/23  Rogelia Jerilynn RAMAN, MD     Critical care time: 63    Tinnie FORBES Adolph [facility]ON[facility] 10/04/23 5:54 PM  Please see [email or web address] for pager details.  From 7A-7P if no response, please call [phone] After hours, please call ELink [phone]

## 2023-10-04 NOTE — Procedures (Signed)
 Paracentesis Procedure Note  Joan Wood  [id number]  1970-10-08  Date:10/04/23  Time:6:41 PM   Provider Performing:Joan Wood    Procedure: Paracentesis with imaging guidance (50916)  Indication(s) Ascites  Consent Risks of the procedure as well as the alternatives and risks of each were explained to the patient and/or caregiver.  Consent for the procedure was obtained and is signed in the bedside chart  Anesthesia Topical only with 1% lidocaine     Time Out Verified patient identification, verified procedure, site/side was marked, verified correct patient position, special equipment/implants available, medications/allergies/relevant history reviewed, required imaging and test results available.   Sterile Technique Maximal sterile technique including full sterile barrier drape, hand hygiene, sterile gown, sterile gloves, mask, hair covering, sterile ultrasound probe cover (if used).   Procedure Description Ultrasound used to identify appropriate peritoneal anatomy for placement and overlying skin marked.  Area of drainage cleaned and draped in sterile fashion. Lidocaine  was used to anesthetize the skin and subcutaneous tissue.  3350 cc's of clear serous appearing fluid was drained. Catheter then removed and bandaid applied to site.   Complications/Tolerance None; patient tolerated the procedure well.   EBL none   Specimen(s) Peritoneal fluid sent for analysis

## 2023-10-04 NOTE — Progress Notes (Signed)
 Progress Note   Patient: Joan Wood FMW:[id number] DOB: [birth year] DOA: 10/03/2023     1 DOS: the patient was seen and examined on 10/04/2023   Brief hospital narrative: 53 year old female with past medical history significant of peptic ulcer disease, pancreatitis, tobacco abuse, sleep apnea, alcohol use disorder, gastroesophageal reflux disease with esophagitis and decompensated cirrhosis; who presented to [facility] with complaints of nausea/vomiting and associated hematemesis and hematochezia.  Hemoglobin is slightly lower than baseline but at time of admission no requiring transfusion.  She was started on IV PPI and octreotide  and kept n.p.o. for endoscopy evaluation by GI service.  Please refer to H&P written by Dr. Sherlon on 10/03/2023 for further info/details on admission.  Assessment and plan 1-acute blood loss anemia secondary to GI bleed - In the setting of upper GI bleed most likely; high concern for peptic ulcer disease, esophageal varices, esophagitis, erosive gastritis, etc.. - Continue IV PPI and octreotide  - Patient n.p.o. for endoscopic evaluation later today - Hemoglobin has trended down to 7.0 and in the setting of ongoing bleeding will transfuse 1 unit of PRBCs - Continue following trend.  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 - Continue judicious analgesics and follow further recommendations by GI service.  3-liver cirrhosis/transaminitis/hyperbilirubinemia and thrombocytopenia - Avoid the use of heparin products SCDs for DVT prophylaxis - Continue PPI - At time of admission IV vitamin K  provided - Follow GI service recommendations and  continue to check for MELD score - Continue PPI.  4-ascites - Continue empirical Rocephin  as mentioned above - Holding diuretics in the setting of low blood pressure and active bleeding currently - Once hemodynamically stable will benefit of large-volume paracentesis. - Follow any further recommendation by GI service.  5-tobacco abuse - Cessation counseling provided - Continue nicotine  patch  6-alcohol abuse - Ongoing - Cessation counseling has been provided - Continue CIWA protocol - Thiamine  and folic acid  initiated - No major withdrawal symptoms currently appreciated.  7-transaminitis - With perfect bleed suggesting elevated LFTs due to alcohol abuse. - Continue supportive care and judicious fluid resuscitation. - Total bilirubin 4.8 (trending down).  8-hyponatremia - In the setting of chronic alcohol abuse most likely - Currently on room 23; about 2 weeks ago around 131 - Per chart review has been a slow as 123-124 in the past. - Continue to follow electrolytes trend and maintain adequate hydration.  Subjective:  Hemodynamically stable despite soft blood pressure.  No requiring pressor support.  No acute withdrawal symptoms.  Patient expressed ongoing abdominal discomfort but denies shortness of breath.  Physical Exam: Vitals:   10/04/23 0800 10/04/23 0822 10/04/23 1048 10/04/23 1123  BP: (!) 95/59 (!) 99/57 102/71 104/71  Pulse:   (!) 115 (!) 117  Resp:   17 (!) 24  Temp: 98 F (36.7 C) 98.1 F (36.7 C) 98.3 F (36.8 C) 98.2 F (36.8 C)  TempSrc: Oral Oral Oral   SpO2:   99% 100%  Weight:   62.4 kg   Height:   5' 2 (1.575 m)    General exam: Alert, awake, oriented x 3; following commands appropriately.  Positive icterus. Respiratory system: Good air movement bilaterally; no using accessory muscles.  Good saturation  on room air appreciated. Cardiovascular system: Sinus tachycardia, no rubs, no gallops, no JVD on exam. Gastrointestinal system: Abdomen is  distended and tense, positive ascites.  Reporting diffuse tenderness.  No rebound.  Positive bowel sounds. Central nervous system: Alert and oriented. No focal neurological deficits. Extremities: No cyanosis or clubbing; no edema appreciated on exam. Skin: No rashes, lesions or ulcers Psychiatry: Judgement and insight appear normal. Mood & affect appropriate.    Data Reviewed: CBC: WBC 6.9, hemoglobin 7.0 and platelet count 70K Comprehensive metabolic panel: Sodium 123, potassium 4.2, chloride 90, bicarb 23, BUN <5, creatinine 0.44, AST 123, ALT 29, total bilirubin 4.8 and GFR >60 Prothrombin time: 25.6 INR: 2.2 H&H after 1 unit PRBCs: 8.6 and 25.6 respectively  Family Communication: No family at bedside.  Disposition: Status is: Inpatient Remains inpatient appropriate because: Continue IV therapy.  Anticipating discharge back home once medically stable; most likely with home health services..  Time spent: 50 minutes  Author: Eric Nunnery, MD 10/04/2023 11:37 AM  For on call review [email or web address].

## 2023-10-04 NOTE — Progress Notes (Signed)
 Nurse to nurse report called to [facility]M. [facility] transport en route.

## 2023-10-04 NOTE — Consult Note (Signed)
 Gastroenterology Consult   Referring Provider: No ref. provider found Primary Care Physician:  Zarwolo, Gloria, FNP Primary Gastroenterologist:  Carlin POUR. Cindie, DO Patient ID: Joan Wood; [id number]; 1970-08-18   Admit date: 10/03/2023  LOS: 1 day   Date of Consultation: 10/04/2023  Reason for Consultation:  GI bleeding    History of Present Illness   Joan Wood is a 53 y.o. female with history of etoh cirrhosis with ongoing etoh abuse complicated by ascites requiring LVAP (no SBP), anxiety, SVT, GERD, chronic diarrhea, rectal bleeding for 1 year, GI bleed 05/2023 with findings of PUD on EGD, gastric perforation 07/2023 with exploratory laparotomy with gastrorrhaphy/omental patch/wedge liver biopsy/primary umbilical hernia repair presents with three days history of N/V, coffee-ground emesis, melena/brbpr loose stools symptoms. She has required numerous hospitalizations since May (this is her 37th).   ED course: Hgb 8.3 (down from 9.1 last week), Plt 83, INR 2 (up from 1.6), Na 123, K 3.3, Mg 1.4,  BUN <5, Cre 0.5, alb 1.8, Tbili 5.6 (up from 3), AP 74, AST 138, ALT 32  CTA GI bleed scan: no active bleeding. Large volume ascites. Distal esophageal wall thickening, new since prior study.   Today: Na 123, K 4.2, Tbili 4.8, AST 123, ALT 29, AP 69, Hgb 7, Plt 70, INR 2.2  CT A/P with contrast 09/29/23: marked colonic wall thickening cecum and ascending colon could be colitis vs portal colopathy, unchanged from prior exam, large ascites, cirrhosis with portal HTN changes, few stable scattered hypodensities noted in the liver.   GI consult: Patient reports her last alcoholic beverage, typically consumes beer, was Sunday, September 13.  However she was in the ED on September 20 with a positive alcohol level at 218.  She states she has been cutting back, used to drink 6-7 beers daily but reports last alcohol use nearly 2 weeks ago and plan to be completely alcohol free within 1 month.   She woke up up early Monday morning around 3 AM feeling nauseated.  She went to the bathroom, developed vomiting and diarrhea.  Passed multiple black loose stools with fresh blood per rectum.  Has not been able to eat any solid food since Sunday morning.  Barely keeping any liquids down. Progressive weakness and lightheadedness.  Last emesis after arrival here, noted to have coffee-ground emesis.  No stool since presentation.  She reports recurrent abdominal distention.  Complains of severe abdominal pain now radiating into the back.  She had a diagnostic paracentesis in the ED on September 20, and the fluid removed to obtain analysis.  No evidence of SBP at that time.  Her last paracentesis was done by ED provider on September 20.  No evidence of SBP. Having reflux symptoms. No dysphagia.   Denies NSAIDS/ASA. No PPI at home per medication list.  EGD 05/2023:  -LA Grade D esophagitis -portal HTN gastropathy -15mm non-bleeding gastric ulcer with clean base s/p bx -3 additional non-bleeding gastric ulcers with largest 4mm -normal exmained duodenum -surveillance in 8-10 weeks -NEGATIVE, h.pylori  Colonoscopy: about 20 years ago with polyps    Prior to Admission medications   Medication Sig Start Date End Date Taking? Authorizing Provider  alum & mag hydroxide-simeth (MAALOX/MYLANTA) 200-200-20 MG/5ML suspension Take 15 mLs by mouth every 6 (six) hours as needed for indigestion or heartburn.   Yes [provider]  ondansetron  (ZOFRAN ) 4 MG tablet Take 1 tablet (4 mg total) by mouth every 8 (eight) hours as needed for nausea or  vomiting. 09/17/23  Yes Rogelia Jerilynn RAMAN, MD  oxyCODONE -acetaminophen  (PERCOCET/ROXICET) 5-325 MG tablet Take 1 tablet by mouth every 6 (six) hours as needed for severe pain (pain score 7-10). 09/29/23  Yes Odell Balls, PA-C  sertraline  (ZOLOFT ) 50 MG tablet Take 1 tablet (50 mg total) by mouth daily. 08/22/23  Yes Pearlean Manus, MD  Elastic Bandages & Supports  (FITRITE BACK BRACE WITH PULLEY) MISC 1 Units by Does not apply route daily. 05/25/23   Bevely Doffing, FNP  ondansetron  (ZOFRAN -ODT) 4 MG disintegrating tablet Take 1 tablet (4 mg total) by mouth every 8 (eight) hours as needed for nausea or vomiting. Patient not taking: Reported on 10/03/2023 09/29/23   Odell Balls, PA-C  potassium chloride  SA (KLOR-CON  M) 20 MEQ tablet Take 2 tablets (40 mEq total) by mouth daily for 2 doses. Patient not taking: Reported on 10/03/2023 09/17/23 09/19/23  Rogelia Jerilynn RAMAN, MD    Current Facility-Administered Medications  Medication Dose Route Frequency Provider Last Rate Last Admin   albumin  human 25 % solution 25 g  25 g Intravenous Once PRN Elgergawy, Dawood S, MD       albuterol  (PROVENTIL ) (2.5 MG/3ML) 0.083% nebulizer solution 2.5 mg  2.5 mg Nebulization Q2H PRN Elgergawy, Dawood S, MD       cefTRIAXone  (ROCEPHIN ) 2 g in sodium chloride  0.9 % 100 mL IVPB  2 g Intravenous Q24H Elgergawy, Dawood S, MD       Chlorhexidine  Gluconate Cloth 2 % PADS 6 each  6 each Topical Q0600 Elgergawy, Dawood S, MD   6 each at 10/04/23 0554   folic acid  (FOLVITE ) tablet 1 mg  1 mg Oral Daily Elgergawy, Dawood S, MD   1 mg at 10/04/23 9058   lactated ringers  infusion   Intravenous Continuous Franklyn Sid SAILOR, MD 75 mL/hr at 10/04/23 0554 Infusion Verify at 10/04/23 0554   LORazepam  (ATIVAN ) tablet 1-4 mg  1-4 mg Oral Q1H PRN Elgergawy, Dawood S, MD       Or   LORazepam  (ATIVAN ) injection 1-4 mg  1-4 mg Intravenous Q1H PRN Elgergawy, Dawood S, MD   1 mg at 10/04/23 0026   multivitamin with minerals tablet 1 tablet  1 tablet Oral Daily Elgergawy, Dawood S, MD   1 tablet at 10/04/23 0941   nicotine  (NICODERM CQ  - dosed in mg/24 hours) patch 21 mg  21 mg Transdermal Daily Elgergawy, Dawood S, MD       octreotide  (SANDOSTATIN ) 500 mcg in sodium chloride  0.9 % 250 mL (2 mcg/mL) infusion  25 mcg/hr Intravenous Continuous Franklyn Sid SAILOR, MD 12.5 mL/hr at 10/04/23 0554 25 mcg/hr at  10/04/23 0554   ondansetron  (ZOFRAN ) tablet 4 mg  4 mg Oral Q6H PRN Elgergawy, Dawood S, MD       Or   ondansetron  (ZOFRAN ) injection 4 mg  4 mg Intravenous Q6H PRN Elgergawy, Dawood S, MD   4 mg at 10/04/23 9386   oxyCODONE  (Oxy IR/ROXICODONE ) immediate release tablet 5 mg  5 mg Oral Q4H PRN Elgergawy, Dawood S, MD   5 mg at 10/04/23 9380   pantoprazole  (PROTONIX ) injection 40 mg  40 mg Intravenous Q12H Franklyn Sid SAILOR, MD   40 mg at 10/04/23 0941   thiamine  (VITAMIN B1) tablet 100 mg  100 mg Oral Daily Elgergawy, Dawood S, MD   100 mg at 10/04/23 0941   Or   thiamine  (VITAMIN B1) injection 100 mg  100 mg Intravenous Daily Elgergawy, Dawood S, MD  Allergies as of 10/03/2023 - Review Complete 10/03/2023  Allergen Reaction Noted   Bee venom Anaphylaxis 08/14/2020   Fire ant Hives and Rash 08/05/2023   Latex Rash 05/11/2023    Past Medical History:  Diagnosis Date   Anxiety    Dysrhythmia    GERD (gastroesophageal reflux disease)    Hernia of abdominal wall    History of hiatal hernia    Sleep apnea    SVT (supraventricular tachycardia)     Past Surgical History:  Procedure Laterality Date   ESOPHAGOGASTRODUODENOSCOPY N/A 05/17/2023   Procedure: EGD (ESOPHAGOGASTRODUODENOSCOPY);  Surgeon: Cindie Carlin POUR, DO;  Location: [facility];  Service: Endoscopy;  Laterality: N/A;   FINGER FRACTURE SURGERY     FRACTURE SURGERY     GASTRORRHAPHY N/A 08/05/2023   Procedure: GASTRORRHAPHY;  Surgeon: Kallie Manuelita BROCKS, MD;  Location: [facility];  Service: General;  Laterality: N/A;   LAPAROTOMY N/A 08/05/2023   Procedure: LAPAROTOMY, EXPLORATORY;  Surgeon: Kallie Manuelita BROCKS, MD;  Location: [facility];  Service: General;  Laterality: N/A;   LIVER BIOPSY N/A 08/05/2023   Procedure: BIOPSY, LIVER;  Surgeon: Kallie Manuelita BROCKS, MD;  Location: [facility];  Service: General;  Laterality: N/A;   right ankle repair     age 59   UMBILICAL HERNIA REPAIR N/A 08/05/2023   Procedure: REPAIR, HERNIA,  UMBILICAL, ADULT;  Surgeon: Kallie Manuelita BROCKS, MD;  Location: [facility];  Service: General;  Laterality: N/A;    Family History  Problem Relation Age of Onset   Cancer Mother    Leukemia Mother    Multiple myeloma Mother    Cancer Father        unsure what kind   Cancer - Colon Neg Hx    Colon polyps Neg Hx     Social History   Socioeconomic History   Marital status: Single    Spouse name: Not on file   Number of children: 4   Years of education: Not on file   Highest education level: Not on file  Occupational History   Not on file  Tobacco Use   Smoking status: Every Day    Current packs/day: 0.50    Average packs/day: 0.7 packs/day for 38.6 years (28.5 ttl pk-yrs)    Types: Cigarettes    Start date: 03/2022   Smokeless tobacco: Never  Vaping Use   Vaping status: Never Used  Substance and Sexual Activity   Alcohol use: Yes    Alcohol/week: 2.0 standard drinks of alcohol    Types: 2 Cans of beer per week    Comment: states she is cutting back on beer, reports last beer nearly two weeks ago but positive alcohol level 5 days ago (09/29/23).   Drug use: Never   Sexual activity: Not Currently    Birth control/protection: Abstinence  Other Topics Concern   Not on file  Social History Narrative   Pt states she has had 4 children, however, one child died died of SIDS at age 60.5 months. Pt is divorced. Pt has a female roommate in  which she lives with. Pt states she is cutting down on drinking beer and now drinks 2-3 beers per week and had her last beer yesterday morning. Pt still smokes but is cutting down and only smokes 3 cigarettes per day now.   Social Drivers of Corporate investment banker Strain: Not on file  Food Insecurity: No Food Insecurity (10/03/2023)   Hunger Vital Sign    Worried About Running  Out of Food in the Last Year: Never true    Ran Out of Food in the Last Year: Never true  Transportation Needs: No Transportation Needs (10/03/2023)   PRAPARE -  Administrator, Civil Service (Medical): No    Lack of Transportation (Non-Medical): No  Recent Concern: Transportation Needs - Unmet Transportation Needs (08/20/2023)   PRAPARE - Administrator, Civil Service (Medical): Yes    Lack of Transportation (Non-Medical): Yes  Physical Activity: Not on file  Stress: Not on file  Social Connections: Socially Isolated (10/03/2023)   Social Connection and Isolation Panel    Frequency of Communication with Friends and Family: Three times a week    Frequency of Social Gatherings with Friends and Family: Never    Attends Religious Services: Never    Database administrator or Organizations: No    Attends Banker Meetings: Never    Marital Status: Divorced  Catering manager Violence: Not At Risk (10/03/2023)   Humiliation, Afraid, Rape, and Kick questionnaire    Fear of Current or Ex-Partner: No    Emotionally Abused: No    Physically Abused: No    Sexually Abused: No     Review of System:   General: Negative for  weight loss, fever, chills, fatigue, +weakness. See hpi Eyes: Negative for vision changes.  ENT: Negative for hoarseness, difficulty swallowing , nasal congestion. CV: Negative for chest pain, angina, palpitations, dyspnea on exertion, peripheral edema.  Respiratory: Negative for dyspnea at rest, dyspnea on exertion, cough, sputum, wheezing.  GI: See history of present illness. GU:  Negative for dysuria, hematuria, urinary incontinence, urinary frequency, nocturnal urination.  MS: Negative for joint pain. +back pain  Derm: Negative for rash or itching.  Neuro: Negative for weakness, abnormal sensation, seizure, frequent headaches, memory loss, confusion.  Psych: Negative for depression, suicidal ideation, hallucinations. +anxiety Endo: Negative for unusual weight change.  Heme: Negative for bruising or bleeding. Allergy: Negative for rash or hives.      Physical Examination:   Vital signs in last  24 hours: Temp:  [97.7 F (36.5 C)-98.7 F (37.1 C)] 98.1 F (36.7 C) (09/25 0822) Pulse Rate:  [108-144] 115 (09/25 0619) Resp:  [10-33] 14 (09/25 0619) BP: (87-139)/(49-84) 99/57 (09/25 0822) SpO2:  [94 %-100 %] 95 % (09/25 0619) Weight:  [56.7 kg-62.4 kg] 62.4 kg (09/25 0200) Last BM Date :  (pta)  General: chronically ill appearing female, appears older than stated ago. No acute distress.  Head: Normocephalic, atraumatic.   Eyes: Conjunctiva pink, ++ icterus. Mouth: Oropharyngeal mucosa dry and pink , no lesions erythema or exudate. Neck: Supple without thyromegaly, masses, or lymphadenopathy.  Lungs: Clear to auscultation bilaterally.  Heart: Regular rate, slight tachycardia in the 110s, no murmurs rubs or gallops.  Abdomen: Bowel sounds are normal, distended, tense, ascites limits exam. Diffuse tenderness. No rebound or guarding.   Rectal: not performed Extremities: No lower extremity edema, clubbing, deformity.  Neuro: Alert and oriented x 4 , grossly normal neurologically.  Skin: Warm and dry, no rash or jaundice.   Psych: Alert and cooperative, normal mood and affect.        Intake/Output from previous day: 09/24 0701 - 09/25 0700 In: 2138 [I.V.:634.1; IV Piggyback:1503.9] Out: -  Intake/Output this shift: No intake/output data recorded.  Lab Results:   CBC Recent Labs    10/03/23 1517 10/04/23 0307  WBC 6.3 6.9  HGB 8.3* 7.0*  HCT 24.1* 20.4*  MCV 92.7  94.4  PLT 83* 70*   BMET Recent Labs    10/03/23 1713 10/04/23 0307  NA 123* 123*  K 3.3* 4.2  CL 86* 90*  CO2 21* 23  GLUCOSE 94 90  BUN <5* <5*  CREATININE 0.50 0.44  CALCIUM  7.5* 7.5*   LFT Recent Labs    10/03/23 1713 10/04/23 0307  BILITOT 5.6* 4.8*  ALKPHOS 74 69  AST 138* 123*  ALT 32 29  PROT 6.3* 6.2*  ALBUMIN  1.8* 1.7*    Lipase Recent Labs    10/03/23 1713  LIPASE 36    PT/INR Recent Labs    10/03/23 1517 10/04/23 0307  LABPROT 24.0* 25.6*  INR 2.0* 2.2*      Hepatitis Panel No results for input(s): HEPBSAG, HCVAB, HEPAIGM, HEPBIGM in the last 72 hours.   Imaging Studies:   CT ANGIO GI BLEED Result Date: 10/03/2023 CLINICAL DATA:  GIB, h/o perforated gastric ulcer. Nausea, vomiting. EXAM: CTA ABDOMEN AND PELVIS WITHOUT AND WITH CONTRAST TECHNIQUE: Multidetector CT imaging of the abdomen and pelvis was performed using the standard protocol during bolus administration of intravenous contrast. Multiplanar reconstructed images and MIPs were obtained and reviewed to evaluate the vascular anatomy. RADIATION DOSE REDUCTION: This exam was performed according to the departmental dose-optimization program which includes automated exposure control, adjustment of the mA and/or kV according to patient size and/or use of iterative reconstruction technique. CONTRAST:  OMNIPAQUE  IOHEXOL  350 MG/ML SOLN COMPARISON:  09/29/2023 FINDINGS: VASCULAR Aorta: Normal caliber aorta without aneurysm, dissection, vasculitis or significant stenosis. Aortic atherosclerosis. Celiac: Patent SMA: Patent Renals: Patent IMA: Patent Inflow: Patent without evidence of aneurysm, dissection, vasculitis or significant stenosis. Proximal Outflow: Bilateral common femoral and visualized portions of the superficial and profunda femoral arteries are patent without evidence of aneurysm, dissection, vasculitis or significant stenosis. Veins: No obvious venous abnormality within the limitations of this arterial phase study. Review of the MIP images confirms the above findings. NON-VASCULAR Lower chest: Distal esophageal wall thickening, new since prior study. No effusions. Hepatobiliary: Severe diffuse fatty infiltration. Changes of cirrhosis, stable. Gallbladder grossly unremarkable. Pancreas: No focal abnormality or ductal dilatation. Spleen: No focal abnormality.  Normal size. Adrenals/Urinary Tract: No adrenal abnormality. No focal renal abnormality. No stones or hydronephrosis. Urinary  bladder is unremarkable. Stomach/Bowel: Stomach and small bowel decompressed. Large bowel unremarkable. No contrast extravasation to localize GI bleed. No bowel obstruction or inflammatory process. Lymphatic: No adenopathy Reproductive: Uterus and adnexa unremarkable.  No mass. Other: Large volume ascites, stable. Musculoskeletal: No acute bony abnormality. IMPRESSION: VASCULAR Aortic atherosclerosis. No active extravasation of contrast to localize GI bleed. NON-VASCULAR Changes of cirrhosis and hepatic steatosis. Stable large volume ascites. Distal esophageal wall thickening, new since prior study. This could reflect esophagitis. Electronically Signed   By: Franky Crease M.D.   On: 10/03/2023 19:34   CT ABDOMEN PELVIS W CONTRAST Result Date: 09/29/2023 CLINICAL DATA:  Severe abdominal pain and swelling. Lower GI bleed, diarrhea and rectal bleeding. Ascites with history of cirrhosis. EXAM: CT ABDOMEN AND PELVIS WITH CONTRAST TECHNIQUE: Multidetector CT imaging of the abdomen and pelvis was performed using the standard protocol following bolus administration of intravenous contrast. RADIATION DOSE REDUCTION: This exam was performed according to the departmental dose-optimization program which includes automated exposure control, adjustment of the mA and/or kV according to patient size and/or use of iterative reconstruction technique. CONTRAST:  OMNIPAQUE  IOHEXOL  300 MG/ML  SOLN COMPARISON:  09/17/2023. FINDINGS: Lower chest: No acute abnormality. Hepatobiliary: A few stable scattered hypodensities  are noted in the liver. Heterogeneous enhancement of the liver is noted with irregular capsular contour, compatible with history of cirrhosis. Steatosis is also noted. The gallbladder is without stones. No biliary ductal dilatation. Pancreas: Unremarkable. No pancreatic ductal dilatation or surrounding inflammatory changes. Spleen: Normal in size without focal abnormality. Adrenals/Urinary Tract: The adrenal glands  are within normal limits. The kidneys enhance symmetrically. No renal calculus or hydronephrosis bilaterally. The bladder is unremarkable. Stomach/Bowel: The stomach is within normal limits. No bowel obstruction, free air, or pneumatosis is seen. A few scattered diverticula at are noted along the colon without evidence of diverticulitis. The appendix is not seen. There is marked colonic wall thickening involving the cecum and proximal ascending colon. Vascular/Lymphatic: Aortic atherosclerosis. The portal vein, splenic vein, and superior mesenteric veins are patent. The umbilical vein appears prominent. Varices are noted in the mesentery. No abdominal or pelvic lymphadenopathy by size criteria. Reproductive: Uterus and bilateral adnexa are unremarkable. Other: Large ascites. Musculoskeletal: Degenerative changes are noted in the thoracolumbar spine. Stable compression deformities are noted in the superior endplates at T12 and T9. No acute osseous abnormality. IMPRESSION: 1. Marked colonic wall thickening involving the cecum and ascending colon, which may be related to colitis versus portal colopathy and unchanged from prior exams. 2. Large ascites. 3. Morphologic changes of cirrhosis and portal hypertension. 4. Aortic atherosclerosis. Electronically Signed   By: Leita Birmingham M.D.   On: 09/29/2023 18:05   CT ABDOMEN PELVIS W CONTRAST Result Date: 09/17/2023 CLINICAL DATA:  Abdominal pain, post-op Bowel obstruction suspected. Peritonitis or perforation suspected. Severe R sided abd pain, distention and recent abd surgery. EXAM: CT ABDOMEN AND PELVIS WITH CONTRAST TECHNIQUE: Multidetector CT imaging of the abdomen and pelvis was performed using the standard protocol following bolus administration of intravenous contrast. RADIATION DOSE REDUCTION: This exam was performed according to the departmental dose-optimization program which includes automated exposure control, adjustment of the mA and/or kV according to  patient size and/or use of iterative reconstruction technique. CONTRAST:  OMNIPAQUE  IOHEXOL  300 MG/ML  SOLN COMPARISON:  CT scan abdomen and pelvis from 09/14/2023. FINDINGS: Lower chest: There are subpleural atelectatic changes in the visualized lung bases. No overt consolidation. No pleural effusion. The heart is normal in size. No pericardial effusion. Hepatobiliary: The liver is normal in size. There is heterogeneous liver attenuation and subtle surface nodularity, favoring cirrhosis. No suspicious mass. These is marked diffuse hepatic steatosis. No intrahepatic or extrahepatic bile duct dilation. No calcified gallstones. Normal gallbladder wall thickness. No pericholecystic inflammatory changes. Pancreas: Unremarkable. No pancreatic ductal dilatation or surrounding inflammatory changes. Spleen: Within normal limits. No focal lesion. Adrenals/Urinary Tract: Adrenal glands are unremarkable. No suspicious renal mass. No hydronephrosis. No renal or ureteric calculi. Unremarkable urinary bladder. Stomach/Bowel: No disproportionate dilation of the small or large bowel loops. No evidence of abnormal bowel wall thickening or inflammatory changes. The appendix is unremarkable. Vascular/Lymphatic: There is moderate ascites. No pneumoperitoneum. There is mild mesenteric edema, similar to the prior study. No abdominal or pelvic lymphadenopathy, by size criteria. No aneurysmal dilation of the major abdominal arteries. There are mild peripheral atherosclerotic vascular calcifications of the aorta and its major branches. Small amount of venous collaterals noted along the lower thoracic esophagus/GE junction and in the left upper abdomen along the gastric wall, which are nonspecific but commonly seen as a sequela of portal hypertension. Reproductive: The uterus is unremarkable. No large adnexal mass. Other: Midabdominal midline surgical scar noted. The soft tissues and abdominal wall are otherwise unremarkable.  Musculoskeletal: No suspicious osseous lesions. There are mild multilevel degenerative changes in the visualized spine. Redemonstration of mild anterior wedging deformity of T12 vertebra. Subacute/healing posteromedial left ninth and tenth rib fractures noted. IMPRESSION: 1. No acute inflammatory process identified within the abdomen or pelvis. No bowel obstruction. 2. Multiple other nonacute observations, as described above. Aortic Atherosclerosis (ICD10-I70.0). Electronically Signed   By: Ree Molt M.D.   On: 09/17/2023 13:13   CT ABDOMEN PELVIS W CONTRAST Result Date: 09/14/2023 CLINICAL DATA:  Abdominal pain, acute, nonlocalized with diarrhea and blood-tinged emesis to 2-3 days. History of cirrhosis. Unable to keep food down. EXAM: CT ABDOMEN AND PELVIS WITH CONTRAST TECHNIQUE: Multidetector CT imaging of the abdomen and pelvis was performed using the standard protocol following bolus administration of intravenous contrast. RADIATION DOSE REDUCTION: This exam was performed according to the departmental dose-optimization program which includes automated exposure control, adjustment of the mA and/or kV according to patient size and/or use of iterative reconstruction technique. CONTRAST:  OMNIPAQUE  IOHEXOL  300 MG/ML  SOLN COMPARISON:  CTs with IV contrast 08/31/2023 and 08/19/2023. FINDINGS: Lower chest: No acute abnormality. Hepatobiliary: Diffusely steatotic and heterogeneous liver, cirrhotic configuration with capsular nodularity over portions. No focal masses seen. Gallbladder is dilated to 11 cm in length. No stones or overt wall thickening. Perihepatic ascites mildly increased since August 22. Pancreas: No abnormality. Spleen: No abnormality.  No splenomegaly. Adrenals/Urinary Tract: Adrenal glands are unremarkable. Kidneys are normal, without renal calculi, focal lesion, or hydronephrosis. Bladder is unremarkable. Stomach/Bowel: Irregular thickening gastric antrum percutaneous pacing could  indicate severe peptic ulcer disease or infiltrating disease. Last EGD was 05/17/2023 and subsequently patient had repair of a gastric ulcer 08/05/2023. Small bowel is normal caliber without focal inflammatory reaction. An appendix is not seen. There are thickened folds in the ascending and transverse colon which could be due to colitis or portal colopathy. Vascular/Lymphatic: The hepatic portal vein is within normal caliber limits. Varices noted in the omentum and mesentery. There is aortic atherosclerosis without aneurysm, stenosis or dissection. No enlarged lymph nodes are seen. Reproductive: Uterus and bilateral adnexa are unremarkable. Other: Mild to moderate free ascites, increased. No free air. Generalized mesenteric congestive change. There are no incarcerated hernias. Laparotomy skin staples previously seen have been removed. Musculoskeletal: There is mild anterior wedging of the T12 vertebral body. Moderately advanced L4-5 facet hypertrophy with grade 1 spondylolisthesis. No acute or other significant osseous findings. Partially healed fractures of the left posterior ninth and tenth ribs are again shown. IMPRESSION: 1. Irregular thickening of the gastric antrum which could be due to severe peptic ulcer disease or infiltrating disease. Last EGD was 05/17/2023 and subsequently patient had repair of a gastric ulcer 08/05/2023. The wall thickening is not significantly changed. 2. Thickened folds in the ascending and transverse colon which could be due to colitis or portal colopathy. 3. Dilated but otherwise unremarkable gallbladder. 4. Cirrhotic liver with mild to moderate ascites, increased since August 22. 5. Increased mesenteric congestive change. 6. Aortic atherosclerosis. 7. Mild chronic anterior wedging of the T12 vertebral body. 8. Partially healed fractures of the left posterior ninth and tenth ribs. Aortic Atherosclerosis (ICD10-I70.0). Electronically Signed   By: Francis Quam M.D.   On: 09/14/2023  00:52  [4 week]  Assessment:   53 y/o female with history of etoh cirrhosis with ongoing etoh abuse complicated by ascites requiring LVAP (no SBP), anxiety, SVT, GERD, chronic diarrhea, rectal bleeding for 1 year, GI bleed 05/2023 with findings of PUD on EGD, gastric  perforation 07/2023 with exploratory laparotomy with gastrorrhaphy/omental patch/wedge liver biopsy/primary umbilical hernia repair presents with N/V, coffee-ground emesis, black stools with fresh blood, symptoms ongoing for 3 days.   GI bleeding: suspected UGI bleed with melena, coffee ground emesis. She has chronic intermittent brbpr, this could be separate issue at this time rather than rapid transit UGI bleed. Agree with covering for possible variceal bleeding with octreotide . She has history of complicated gastric ulcer history requiring surgery due to perforation back in July.  Last EGD in May she had evidence of portal hypertensive gastropathy, no esophageal varices at that time although cannot rule out varices now given her decompensated liver disease over the last several months and go ongoing alcohol abuse. CT concerning for varices in the mesentery. -Hgb 9.1 (9/20)-->8.3 (9/24)-->7.0 (today). One unit of prbcs hanging. Will prepare additional unit to have ready if needed. -platelets 70K -INR 2.2. vit K 10mg  IV received yesterday evening  -continues with chronic rectal bleeding, remote colonoscopy   Decompensated ETOH Cirrhosis/ETOH Hepatitis: -ongoing alcohol use -recurrent ascites, no prior SBP. Negative diagnostic tap in the ED 9/20.  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 Prognosis poor especially if she continues with ongoing alcohol use.  -DF of 62.8 with using PT control of 13 -no HE  Abdominal pain: -likely multifactorial including distention from tense ascites, etoh gastritis, cannot rule out ulcer disease.  -needs to stay on PPI BID  as outpatient -no SBP 9/20 on diagnostic tap -assess at time of EGD  Chronic rectal bleeding: -eventual outpatient colonoscopy    Plan:   Continue Rocephin , SBP prophylaxis in setting of cirrhosis/GI bleeding. Continue Octreotide .  IV PPI BID. Patient needs to remain on PPI BID as outpatient. Transfuse if Hgb <7. EGD with possible esophageal variceal banding today.  I have discussed the risks, alternatives, benefits with regards to but not limited to the risk of reaction to medication, bleeding, infection, perforation and the patient is agreeable to proceed. Written consent to be obtained. LVAP after GI bleeding addressed, prior to discharge Complete ETOH cessation. Eventual outpatient colonoscopy.   LOS: 1 day   We would like to thank you for the opportunity to participate in the care of Joan Wood.  Sonny RAMAN. Ezzard RIGGERS [facility] [phone] 9/25/20259:46 AM

## 2023-10-04 NOTE — Interval H&P Note (Signed)
 History and Physical Interval Note:  10/04/2023 11:07 AM  Joan Wood  has presented today for surgery, with the diagnosis of melena, coffee ground emesis, acute blood loss anemia.  The various methods of treatment have been discussed with the patient and family. After consideration of risks, benefits and other options for treatment, the patient has consented to  Procedure(s): EGD (ESOPHAGOGASTRODUODENOSCOPY) (N/A) as a surgical intervention.  The patient's history has been reviewed, patient examined, no change in status, stable for surgery.  I have reviewed the patient's chart and labs.  Questions were answered to the patient's satisfaction.     I thoroughly discussed with the patient the procedure, including the risks involved. Patient understands what the procedure involves including the benefits and any risks. Patient understands alternatives to the proposed procedure. Risks including (but not limited to) bleeding, tearing of the lining (perforation), rupture of adjacent organs, problems with heart and lung function, infection, and medication reactions. A small percentage of complications may require surgery, hospitalization, repeat endoscopic procedure, and/or transfusion.  Patient understood and agreed.   Given MELD 3.0 : 30 . I discussed with patient higher risk for peri-procedural risk and adverse events  , patient is agreeable to proceed with life saving procedure to locate and stop any active bleeding   Joan Wood

## 2023-10-04 NOTE — TOC Initial Note (Addendum)
 Transition of Care [facility]) - Initial/Assessment Note    Patient Details  Name: Joan Wood MRN: [id number] Date of Birth: 04-28-1970  [facility]) CM/SW Contact:    Lucie Lunger, LCSWA Phone Number: 10/04/2023, 9:08 AM  Clinical Narrative:                 Pt is high risk for readmission. CSW notes pt is well known to [facility] from past hospital admissions. Pt lives alone and is independent in completing her ADLs. Pt has a cane, walker and wheelchair in the home. Pt was informed at last admission that tub bench was not covered by insurance, pt was understanding and stated she would reach out to [employer] to see if they had any. Pt also declined SNF placement at last hospital admission, outpatient PT/OT referral was made at pts request. TOC to follow.   Expected Discharge Plan: Home/Self Care Barriers to Discharge: Continued Medical Work up   Patient Goals and CMS Choice Patient states their goals for this hospitalization and ongoing recovery are:: return home CMS Medicare.gov Compare Post Acute Care list provided to:: Patient Choice offered to / list presented to : Patient      Expected Discharge Plan and Services In-house Referral: Clinical Social Work Discharge Planning Services: CM Consult   Living arrangements for the past 2 months: Single Family Home                                      Prior Living Arrangements/Services Living arrangements for the past 2 months: Single Family Home Lives with:: Self Patient language and need for interpreter reviewed:: Yes Do you feel safe going back to the place where you live?: Yes      Need for Family Participation in Patient Care: Yes (Comment) Care giver support system in place?: Yes (comment) Current home services: DME Criminal Activity/Legal Involvement Pertinent to Current Situation/Hospitalization: No - Comment as needed  Activities of Daily Living   ADL Screening (condition at time of  admission) Independently performs ADLs?: Yes (appropriate for developmental age) Is the patient deaf or have difficulty hearing?: No Does the patient have difficulty seeing, even when wearing glasses/contacts?: No Does the patient have difficulty concentrating, remembering, or making decisions?: No  Permission Sought/Granted                  Emotional Assessment Appearance:: Appears stated age Attitude/Demeanor/Rapport: Engaged Affect (typically observed): Accepting Orientation: : Oriented to Self, Oriented to Place, Oriented to  Time, Oriented to Situation Alcohol / Substance Use: Not Applicable Psych Involvement: No (comment)  Admission diagnosis:  Hyponatremia [E87.1] GI bleed [K92.2] Nausea vomiting and diarrhea [R11.2, R19.7] Gastrointestinal hemorrhage, unspecified gastrointestinal hemorrhage type [K92.2] Acute on chronic blood loss anemia [D62] Patient Active Problem List   Diagnosis Date Noted   Other fatigue 09/17/2023   GI bleed 09/01/2023   Lactic acidosis 09/01/2023   Elevated lipase 09/01/2023   Liver cirrhosis (HCC) 09/01/2023   Colitis 08/28/2023   Hematochezia 08/22/2023   History of gastric ulcer 08/22/2023   Abdominal pain 08/20/2023   Liver lesion 08/14/2023   Malnutrition of moderate degree 08/08/2023   ABLA (acute blood loss anemia) 08/08/2023   Pneumoperitoneum 08/05/2023   Perforated abdominal viscus 08/05/2023   Syncope 07/30/2023   Depression, recurrent 07/30/2023   Generalized abdominal pain 06/04/2023   Nausea vomiting and diarrhea 06/04/2023   Enteritis 05/28/2023  Intractable vomiting 05/28/2023   Perforated ulcer (HCC) 05/27/2023   Alcoholic cirrhosis (HCC) 05/27/2023   Hypomagnesemia 05/17/2023   Hyponatremia 05/15/2023   Tobacco abuse 05/15/2023   Acute hyponatremia 05/11/2023   Pancreatitis 05/11/2023   Gastritis 05/11/2023   Hypokalemia 05/11/2023   Multiple closed fractures of metatarsal bone with routine healing, right  04/24/2023   Neuropathy 03/31/2023   Diarrhea 03/31/2023   GERD (gastroesophageal reflux disease) 03/31/2023   Nausea and vomiting 03/31/2023   Alcohol use disorder 03/28/2022   Left hip pain 03/28/2022   Incarcerated umbilical hernia 03/28/2022   Adjustment disorder with mixed anxiety and depressed mood 12/15/2014   Malingering 12/14/2014   PCP:  Zarwolo, Gloria, FNP Pharmacy:   [facility] [phone] - [place], [place] - [address] AT [facility] [address] [name] [address] Phone: [phone] Fax: [phone]     Social Drivers of Health (SDOH) Social History: SDOH Screenings   Food Insecurity: No Food Insecurity (10/03/2023)  Housing: Low Risk  (10/03/2023)  Transportation Needs: No Transportation Needs (10/03/2023)  Recent Concern: Transportation Needs - Unmet Transportation Needs (08/20/2023)  Utilities: Not At Risk (10/03/2023)  Depression (PHQ2-9): High Risk (07/30/2023)  Social Connections: Socially Isolated (10/03/2023)  Tobacco Use: High Risk (10/03/2023)   SDOH Interventions:     Readmission Risk Interventions    10/04/2023    9:02 AM 09/03/2023   12:31 PM 09/02/2023    9:01 AM  Readmission Risk Prevention Plan  Transportation Screening Complete Complete Complete  PCP or Specialist Appt within 3-5 Days   Complete  HRI or Home Care Consult   Complete  Social Work Consult for Recovery Care Planning/Counseling   Complete  Palliative Care Screening   Not Applicable  Medication Review Oceanographer) Complete Complete Complete  HRI or Home Care Consult Complete Complete   SW Recovery Care/Counseling Consult Complete Complete   Palliative Care Screening Not Applicable Not Applicable   Skilled Nursing Facility Not Applicable Patient Refused

## 2023-10-04 NOTE — Transfer of Care (Signed)
 Immediate Anesthesia Transfer of Care Note  Patient: Joan Wood  Procedure(s) Performed: EGD (ESOPHAGOGASTRODUODENOSCOPY)  Patient Location: PACU  Anesthesia Type:MAC  Level of Consciousness:  awake and alert  Airway & Oxygen Therapy: Patient Spontanous Breathing and Patient connected to nasal cannula oxygen  Post-op Assessment: Report given to RN and Post -op Vital signs reviewed and stable  Post vital signs: Reviewed and stable  Last Vitals:  Vitals Value Taken Time  BP    Temp    Pulse 118 10/04/23 11:23  Resp 16 10/04/23 11:23  SpO2 100 % 10/04/23 11:23  Vitals shown include unfiled device data.  Last Pain:  Vitals:   10/04/23 1103  TempSrc:   PainSc: 10-Worst pain ever      Patients Stated Pain Goal: 7 (10/04/23 1051)  Complications: No notable events documented.

## 2023-10-04 NOTE — Brief Op Note (Signed)
 Patient underwent EGD  under propofol  sedation.  Tolerated the procedure adequately.   FINDINGS:  - LA Grade D ( one or more mucosal breaks involving at least 75% of esophageal circumference) esophagitis with no bleeding.   - Black discoloration mucosa in the esophagus concerning for acute esophageal necrosis ( AEN) .   - Portal hypertensive gastropathy.   - Non- bleeding gastric ulcer with a clean ulcer base ( Forrest Class III) which apparent sutures.   - Non- bleeding duodenal ulcers with a clean ulcer base ( Forrest Class III) .   - No specimens collected. - No evidence of active bleeding or varices seen throughout the exam   RECOMMENDATIONS  Given decompensated cirrhosis and findings concerning for acute esophageal necrosis ( AEN) would recommend tranfer to a tertiary center for further management   -NPO IV Fluids   -Broad spectrum antibiotics   -Workup for concerning for ZES : Send gastrin level   -Maintain 2 large bore IV lines   -CBC Q8 hours and tranfuse to keep HBG> 7   -PPI IV BID   -Would recommend repeat surveillance EGD in 4- 6 weeks to assess healing of esophagitis / ulcers , depending on clinical status  Glendon Fiser Faizan Dorota Heinrichs, MD Gastroenterology and Hepatology [facility]

## 2023-10-04 NOTE — Op Note (Signed)
 [facility] Patient Name: Joan Wood Procedure Date: 10/04/2023 10:34 AM MRN: [id number] Date of Birth: 02/24/1970 Attending MD: Deatrice Dine , MD, [id number] CSN: [id number] Age: 53 Admit Type: Outpatient Procedure:                Upper GI endoscopy Indications:              Acute post hemorrhagic anemia, Coffee-ground emesis Providers:                Deatrice Dine, MD, Madelin Hunter, RN, [place] Wilson,                            Technician Referring MD:              Medicines:                Monitored Anesthesia Care Complications:            No immediate complications. Estimated Blood Loss:     Estimated blood loss: none. Procedure:                Pre-Anesthesia Assessment:                           - Prior to the procedure, a History and Physical                            was performed, and patient medications and                            allergies were reviewed. The patient's tolerance of                            previous anesthesia was also reviewed. The risks                            and benefits of the procedure and the sedation                            options and risks were discussed with the patient.                            All questions were answered, and informed consent                            was obtained. Prior Anticoagulants: The patient has                            taken no anticoagulant or antiplatelet agents. ASA                            Grade Assessment: IV - A patient with severe                            systemic disease that is a constant threat to life.  After reviewing the risks and benefits, the patient                            was deemed in satisfactory condition to undergo the                            procedure.                           After obtaining informed consent, the endoscope was                            passed under direct vision. Throughout the                            procedure, the  patient's blood pressure, pulse, and                            oxygen saturations were monitored continuously. The                            [id number] ([id number]) Upper was introduced through                            the mouth, and advanced to the second part of                            duodenum. The upper GI endoscopy was accomplished                            without difficulty. The patient tolerated the                            procedure well. Scope In: 11:12:14 AM Scope Out: 11:16:53 AM Total Procedure Duration: 0 hours 4 minutes 39 seconds  Findings:      LA Grade D (one or more mucosal breaks involving at least 75% of       esophageal circumference) esophagitis with no bleeding was found in the       middle and lower thirds of the esophagus.      Diffuse severe mucosal changes characterized by black discoloration were       found in the middle third of the esophagus and in the lower third of the       esophagus.      Severe portal hypertensive gastropathy was found in the entire examined       stomach.      One non-bleeding cratered gastric ulcer with a clean ulcer base (Forrest       Class III) was found in the gastric antrum. The lesion was twenty mm by       twenty mm in largest dimension.      Many non-bleeding superficial duodenal ulcers with a clean ulcer base       (Forrest Class III) were found in the second portion of the duodenum.      There is no endoscopic evidence of bleeding in the entire examined  stomach. Impression:               - LA Grade D (one or more mucosal breaks involving                            at least 75% of esophageal circumference)                            esophagitis with no bleeding.                           - Black discoloration mucosa in the esophagus                            concerning for acute esophageal necrosis ( AEN).                           - Portal hypertensive gastropathy.                           - Non-bleeding  gastric ulcer with a clean ulcer                            base (Forrest Class III) which apparent sutures.                           - Non-bleeding duodenal ulcers with a clean ulcer                            base (Forrest Class III).                           - No specimens collected.                           -No evidence of active bleeding or varices seen                            throughout the exam Moderate Sedation:      Per Anesthesia Care Recommendation:           Given decompensated cirrhosis and findings                            concerning for acute esophageal necrosis ( AEN)                            would recommend tranfer to a tertiary center for                            further management                           NPO                           IV Fluids  Broad spectrum antibiotics                           Workup for concerning for ZES : Send gastrin level                           Maintain 2 large bore IV lines                           CBC Q8 hours and tranfuse to keep HBG>7                           PPI IV BID                           Would recommend repeat surveillance EGD in 4-6                            weeks to assess healing of esophagitis /ulcers ,                            depending on clinical status Procedure Code(s):        --- Professional ---                           [phone], Esophagogastroduodenoscopy, flexible,                            transoral; diagnostic, including collection of                            specimen(s) by brushing or washing, when performed                            (separate procedure) Diagnosis Code(s):        --- Professional ---                           K20.90, Esophagitis, unspecified without bleeding                           K76.6, Portal hypertension                           K31.89, Other diseases of stomach and duodenum                           K25.9, Gastric ulcer, unspecified as acute  or                            chronic, without hemorrhage or perforation                           K26.9, Duodenal ulcer, unspecified as acute or                            chronic,  without hemorrhage or perforation                           D62, Acute posthemorrhagic anemia                           K92.0, Hematemesis CPT copyright 2022 American Medical Association. All rights reserved. The codes documented in this report are preliminary and upon coder review may  be revised to meet current compliance requirements. Deatrice Dine, MD Deatrice Dine, MD 10/04/2023 11:43:24 AM This report has been signed electronically. Number of Addenda: 0

## 2023-10-04 NOTE — H&P (View-Only) (Signed)
 Gastroenterology Consult   Referring Provider: No ref. provider found Primary Care Physician:  Zarwolo, Gloria, FNP Primary Gastroenterologist:  Carlin POUR. Cindie, DO Patient ID: IMOJEAN YOSHINO; [id number]; 1970-08-18   Admit date: 10/03/2023  LOS: 1 day   Date of Consultation: 10/04/2023  Reason for Consultation:  GI bleeding    History of Present Illness   LARHONDA DETTLOFF is a 53 y.o. female with history of etoh cirrhosis with ongoing etoh abuse complicated by ascites requiring LVAP (no SBP), anxiety, SVT, GERD, chronic diarrhea, rectal bleeding for 1 year, GI bleed 05/2023 with findings of PUD on EGD, gastric perforation 07/2023 with exploratory laparotomy with gastrorrhaphy/omental patch/wedge liver biopsy/primary umbilical hernia repair presents with three days history of N/V, coffee-ground emesis, melena/brbpr loose stools symptoms. She has required numerous hospitalizations since May (this is her 37th).   ED course: Hgb 8.3 (down from 9.1 last week), Plt 83, INR 2 (up from 1.6), Na 123, K 3.3, Mg 1.4,  BUN <5, Cre 0.5, alb 1.8, Tbili 5.6 (up from 3), AP 74, AST 138, ALT 32  CTA GI bleed scan: no active bleeding. Large volume ascites. Distal esophageal wall thickening, new since prior study.   Today: Na 123, K 4.2, Tbili 4.8, AST 123, ALT 29, AP 69, Hgb 7, Plt 70, INR 2.2  CT A/P with contrast 09/29/23: marked colonic wall thickening cecum and ascending colon could be colitis vs portal colopathy, unchanged from prior exam, large ascites, cirrhosis with portal HTN changes, few stable scattered hypodensities noted in the liver.   GI consult: Patient reports her last alcoholic beverage, typically consumes beer, was Sunday, September 13.  However she was in the ED on September 20 with a positive alcohol level at 218.  She states she has been cutting back, used to drink 6-7 beers daily but reports last alcohol use nearly 2 weeks ago and plan to be completely alcohol free within 1 month.   She woke up up early Monday morning around 3 AM feeling nauseated.  She went to the bathroom, developed vomiting and diarrhea.  Passed multiple black loose stools with fresh blood per rectum.  Has not been able to eat any solid food since Sunday morning.  Barely keeping any liquids down. Progressive weakness and lightheadedness.  Last emesis after arrival here, noted to have coffee-ground emesis.  No stool since presentation.  She reports recurrent abdominal distention.  Complains of severe abdominal pain now radiating into the back.  She had a diagnostic paracentesis in the ED on September 20, and the fluid removed to obtain analysis.  No evidence of SBP at that time.  Her last paracentesis was done by ED provider on September 20.  No evidence of SBP. Having reflux symptoms. No dysphagia.   Denies NSAIDS/ASA. No PPI at home per medication list.  EGD 05/2023:  -LA Grade D esophagitis -portal HTN gastropathy -15mm non-bleeding gastric ulcer with clean base s/p bx -3 additional non-bleeding gastric ulcers with largest 4mm -normal exmained duodenum -surveillance in 8-10 weeks -NEGATIVE, h.pylori  Colonoscopy: about 20 years ago with polyps    Prior to Admission medications   Medication Sig Start Date End Date Taking? Authorizing Provider  alum & mag hydroxide-simeth (MAALOX/MYLANTA) 200-200-20 MG/5ML suspension Take 15 mLs by mouth every 6 (six) hours as needed for indigestion or heartburn.   Yes [provider]  ondansetron  (ZOFRAN ) 4 MG tablet Take 1 tablet (4 mg total) by mouth every 8 (eight) hours as needed for nausea or  vomiting. 09/17/23  Yes Rogelia Jerilynn RAMAN, MD  oxyCODONE -acetaminophen  (PERCOCET/ROXICET) 5-325 MG tablet Take 1 tablet by mouth every 6 (six) hours as needed for severe pain (pain score 7-10). 09/29/23  Yes Odell Balls, PA-C  sertraline  (ZOLOFT ) 50 MG tablet Take 1 tablet (50 mg total) by mouth daily. 08/22/23  Yes Pearlean Manus, MD  Elastic Bandages & Supports  (FITRITE BACK BRACE WITH PULLEY) MISC 1 Units by Does not apply route daily. 05/25/23   Bevely Doffing, FNP  ondansetron  (ZOFRAN -ODT) 4 MG disintegrating tablet Take 1 tablet (4 mg total) by mouth every 8 (eight) hours as needed for nausea or vomiting. Patient not taking: Reported on 10/03/2023 09/29/23   Odell Balls, PA-C  potassium chloride  SA (KLOR-CON  M) 20 MEQ tablet Take 2 tablets (40 mEq total) by mouth daily for 2 doses. Patient not taking: Reported on 10/03/2023 09/17/23 09/19/23  Rogelia Jerilynn RAMAN, MD    Current Facility-Administered Medications  Medication Dose Route Frequency Provider Last Rate Last Admin   albumin  human 25 % solution 25 g  25 g Intravenous Once PRN Elgergawy, Dawood S, MD       albuterol  (PROVENTIL ) (2.5 MG/3ML) 0.083% nebulizer solution 2.5 mg  2.5 mg Nebulization Q2H PRN Elgergawy, Dawood S, MD       cefTRIAXone  (ROCEPHIN ) 2 g in sodium chloride  0.9 % 100 mL IVPB  2 g Intravenous Q24H Elgergawy, Dawood S, MD       Chlorhexidine  Gluconate Cloth 2 % PADS 6 each  6 each Topical Q0600 Elgergawy, Dawood S, MD   6 each at 10/04/23 0554   folic acid  (FOLVITE ) tablet 1 mg  1 mg Oral Daily Elgergawy, Dawood S, MD   1 mg at 10/04/23 9058   lactated ringers  infusion   Intravenous Continuous Franklyn Sid SAILOR, MD 75 mL/hr at 10/04/23 0554 Infusion Verify at 10/04/23 0554   LORazepam  (ATIVAN ) tablet 1-4 mg  1-4 mg Oral Q1H PRN Elgergawy, Dawood S, MD       Or   LORazepam  (ATIVAN ) injection 1-4 mg  1-4 mg Intravenous Q1H PRN Elgergawy, Dawood S, MD   1 mg at 10/04/23 0026   multivitamin with minerals tablet 1 tablet  1 tablet Oral Daily Elgergawy, Dawood S, MD   1 tablet at 10/04/23 0941   nicotine  (NICODERM CQ  - dosed in mg/24 hours) patch 21 mg  21 mg Transdermal Daily Elgergawy, Dawood S, MD       octreotide  (SANDOSTATIN ) 500 mcg in sodium chloride  0.9 % 250 mL (2 mcg/mL) infusion  25 mcg/hr Intravenous Continuous Franklyn Sid SAILOR, MD 12.5 mL/hr at 10/04/23 0554 25 mcg/hr at  10/04/23 0554   ondansetron  (ZOFRAN ) tablet 4 mg  4 mg Oral Q6H PRN Elgergawy, Dawood S, MD       Or   ondansetron  (ZOFRAN ) injection 4 mg  4 mg Intravenous Q6H PRN Elgergawy, Dawood S, MD   4 mg at 10/04/23 9386   oxyCODONE  (Oxy IR/ROXICODONE ) immediate release tablet 5 mg  5 mg Oral Q4H PRN Elgergawy, Dawood S, MD   5 mg at 10/04/23 9380   pantoprazole  (PROTONIX ) injection 40 mg  40 mg Intravenous Q12H Franklyn Sid SAILOR, MD   40 mg at 10/04/23 0941   thiamine  (VITAMIN B1) tablet 100 mg  100 mg Oral Daily Elgergawy, Dawood S, MD   100 mg at 10/04/23 0941   Or   thiamine  (VITAMIN B1) injection 100 mg  100 mg Intravenous Daily Elgergawy, Dawood S, MD  Allergies as of 10/03/2023 - Review Complete 10/03/2023  Allergen Reaction Noted   Bee venom Anaphylaxis 08/14/2020   Fire ant Hives and Rash 08/05/2023   Latex Rash 05/11/2023    Past Medical History:  Diagnosis Date   Anxiety    Dysrhythmia    GERD (gastroesophageal reflux disease)    Hernia of abdominal wall    History of hiatal hernia    Sleep apnea    SVT (supraventricular tachycardia)     Past Surgical History:  Procedure Laterality Date   ESOPHAGOGASTRODUODENOSCOPY N/A 05/17/2023   Procedure: EGD (ESOPHAGOGASTRODUODENOSCOPY);  Surgeon: Cindie Carlin POUR, DO;  Location: [facility];  Service: Endoscopy;  Laterality: N/A;   FINGER FRACTURE SURGERY     FRACTURE SURGERY     GASTRORRHAPHY N/A 08/05/2023   Procedure: GASTRORRHAPHY;  Surgeon: Kallie Manuelita BROCKS, MD;  Location: [facility];  Service: General;  Laterality: N/A;   LAPAROTOMY N/A 08/05/2023   Procedure: LAPAROTOMY, EXPLORATORY;  Surgeon: Kallie Manuelita BROCKS, MD;  Location: [facility];  Service: General;  Laterality: N/A;   LIVER BIOPSY N/A 08/05/2023   Procedure: BIOPSY, LIVER;  Surgeon: Kallie Manuelita BROCKS, MD;  Location: [facility];  Service: General;  Laterality: N/A;   right ankle repair     age 59   UMBILICAL HERNIA REPAIR N/A 08/05/2023   Procedure: REPAIR, HERNIA,  UMBILICAL, ADULT;  Surgeon: Kallie Manuelita BROCKS, MD;  Location: [facility];  Service: General;  Laterality: N/A;    Family History  Problem Relation Age of Onset   Cancer Mother    Leukemia Mother    Multiple myeloma Mother    Cancer Father        unsure what kind   Cancer - Colon Neg Hx    Colon polyps Neg Hx     Social History   Socioeconomic History   Marital status: Single    Spouse name: Not on file   Number of children: 4   Years of education: Not on file   Highest education level: Not on file  Occupational History   Not on file  Tobacco Use   Smoking status: Every Day    Current packs/day: 0.50    Average packs/day: 0.7 packs/day for 38.6 years (28.5 ttl pk-yrs)    Types: Cigarettes    Start date: 03/2022   Smokeless tobacco: Never  Vaping Use   Vaping status: Never Used  Substance and Sexual Activity   Alcohol use: Yes    Alcohol/week: 2.0 standard drinks of alcohol    Types: 2 Cans of beer per week    Comment: states she is cutting back on beer, reports last beer nearly two weeks ago but positive alcohol level 5 days ago (09/29/23).   Drug use: Never   Sexual activity: Not Currently    Birth control/protection: Abstinence  Other Topics Concern   Not on file  Social History Narrative   Pt states she has had 4 children, however, one child died died of SIDS at age 60.5 months. Pt is divorced. Pt has a female roommate in  which she lives with. Pt states she is cutting down on drinking beer and now drinks 2-3 beers per week and had her last beer yesterday morning. Pt still smokes but is cutting down and only smokes 3 cigarettes per day now.   Social Drivers of Corporate investment banker Strain: Not on file  Food Insecurity: No Food Insecurity (10/03/2023)   Hunger Vital Sign    Worried About Running  Out of Food in the Last Year: Never true    Ran Out of Food in the Last Year: Never true  Transportation Needs: No Transportation Needs (10/03/2023)   PRAPARE -  Administrator, Civil Service (Medical): No    Lack of Transportation (Non-Medical): No  Recent Concern: Transportation Needs - Unmet Transportation Needs (08/20/2023)   PRAPARE - Administrator, Civil Service (Medical): Yes    Lack of Transportation (Non-Medical): Yes  Physical Activity: Not on file  Stress: Not on file  Social Connections: Socially Isolated (10/03/2023)   Social Connection and Isolation Panel    Frequency of Communication with Friends and Family: Three times a week    Frequency of Social Gatherings with Friends and Family: Never    Attends Religious Services: Never    Database administrator or Organizations: No    Attends Banker Meetings: Never    Marital Status: Divorced  Catering manager Violence: Not At Risk (10/03/2023)   Humiliation, Afraid, Rape, and Kick questionnaire    Fear of Current or Ex-Partner: No    Emotionally Abused: No    Physically Abused: No    Sexually Abused: No     Review of System:   General: Negative for  weight loss, fever, chills, fatigue, +weakness. See hpi Eyes: Negative for vision changes.  ENT: Negative for hoarseness, difficulty swallowing , nasal congestion. CV: Negative for chest pain, angina, palpitations, dyspnea on exertion, peripheral edema.  Respiratory: Negative for dyspnea at rest, dyspnea on exertion, cough, sputum, wheezing.  GI: See history of present illness. GU:  Negative for dysuria, hematuria, urinary incontinence, urinary frequency, nocturnal urination.  MS: Negative for joint pain. +back pain  Derm: Negative for rash or itching.  Neuro: Negative for weakness, abnormal sensation, seizure, frequent headaches, memory loss, confusion.  Psych: Negative for depression, suicidal ideation, hallucinations. +anxiety Endo: Negative for unusual weight change.  Heme: Negative for bruising or bleeding. Allergy: Negative for rash or hives.      Physical Examination:   Vital signs in last  24 hours: Temp:  [97.7 F (36.5 C)-98.7 F (37.1 C)] 98.1 F (36.7 C) (09/25 0822) Pulse Rate:  [108-144] 115 (09/25 0619) Resp:  [10-33] 14 (09/25 0619) BP: (87-139)/(49-84) 99/57 (09/25 0822) SpO2:  [94 %-100 %] 95 % (09/25 0619) Weight:  [56.7 kg-62.4 kg] 62.4 kg (09/25 0200) Last BM Date :  (pta)  General: chronically ill appearing female, appears older than stated ago. No acute distress.  Head: Normocephalic, atraumatic.   Eyes: Conjunctiva pink, ++ icterus. Mouth: Oropharyngeal mucosa dry and pink , no lesions erythema or exudate. Neck: Supple without thyromegaly, masses, or lymphadenopathy.  Lungs: Clear to auscultation bilaterally.  Heart: Regular rate, slight tachycardia in the 110s, no murmurs rubs or gallops.  Abdomen: Bowel sounds are normal, distended, tense, ascites limits exam. Diffuse tenderness. No rebound or guarding.   Rectal: not performed Extremities: No lower extremity edema, clubbing, deformity.  Neuro: Alert and oriented x 4 , grossly normal neurologically.  Skin: Warm and dry, no rash or jaundice.   Psych: Alert and cooperative, normal mood and affect.        Intake/Output from previous day: 09/24 0701 - 09/25 0700 In: 2138 [I.V.:634.1; IV Piggyback:1503.9] Out: -  Intake/Output this shift: No intake/output data recorded.  Lab Results:   CBC Recent Labs    10/03/23 1517 10/04/23 0307  WBC 6.3 6.9  HGB 8.3* 7.0*  HCT 24.1* 20.4*  MCV 92.7  94.4  PLT 83* 70*   BMET Recent Labs    10/03/23 1713 10/04/23 0307  NA 123* 123*  K 3.3* 4.2  CL 86* 90*  CO2 21* 23  GLUCOSE 94 90  BUN <5* <5*  CREATININE 0.50 0.44  CALCIUM  7.5* 7.5*   LFT Recent Labs    10/03/23 1713 10/04/23 0307  BILITOT 5.6* 4.8*  ALKPHOS 74 69  AST 138* 123*  ALT 32 29  PROT 6.3* 6.2*  ALBUMIN  1.8* 1.7*    Lipase Recent Labs    10/03/23 1713  LIPASE 36    PT/INR Recent Labs    10/03/23 1517 10/04/23 0307  LABPROT 24.0* 25.6*  INR 2.0* 2.2*      Hepatitis Panel No results for input(s): HEPBSAG, HCVAB, HEPAIGM, HEPBIGM in the last 72 hours.   Imaging Studies:   CT ANGIO GI BLEED Result Date: 10/03/2023 CLINICAL DATA:  GIB, h/o perforated gastric ulcer. Nausea, vomiting. EXAM: CTA ABDOMEN AND PELVIS WITHOUT AND WITH CONTRAST TECHNIQUE: Multidetector CT imaging of the abdomen and pelvis was performed using the standard protocol during bolus administration of intravenous contrast. Multiplanar reconstructed images and MIPs were obtained and reviewed to evaluate the vascular anatomy. RADIATION DOSE REDUCTION: This exam was performed according to the departmental dose-optimization program which includes automated exposure control, adjustment of the mA and/or kV according to patient size and/or use of iterative reconstruction technique. CONTRAST:  OMNIPAQUE  IOHEXOL  350 MG/ML SOLN COMPARISON:  09/29/2023 FINDINGS: VASCULAR Aorta: Normal caliber aorta without aneurysm, dissection, vasculitis or significant stenosis. Aortic atherosclerosis. Celiac: Patent SMA: Patent Renals: Patent IMA: Patent Inflow: Patent without evidence of aneurysm, dissection, vasculitis or significant stenosis. Proximal Outflow: Bilateral common femoral and visualized portions of the superficial and profunda femoral arteries are patent without evidence of aneurysm, dissection, vasculitis or significant stenosis. Veins: No obvious venous abnormality within the limitations of this arterial phase study. Review of the MIP images confirms the above findings. NON-VASCULAR Lower chest: Distal esophageal wall thickening, new since prior study. No effusions. Hepatobiliary: Severe diffuse fatty infiltration. Changes of cirrhosis, stable. Gallbladder grossly unremarkable. Pancreas: No focal abnormality or ductal dilatation. Spleen: No focal abnormality.  Normal size. Adrenals/Urinary Tract: No adrenal abnormality. No focal renal abnormality. No stones or hydronephrosis. Urinary  bladder is unremarkable. Stomach/Bowel: Stomach and small bowel decompressed. Large bowel unremarkable. No contrast extravasation to localize GI bleed. No bowel obstruction or inflammatory process. Lymphatic: No adenopathy Reproductive: Uterus and adnexa unremarkable.  No mass. Other: Large volume ascites, stable. Musculoskeletal: No acute bony abnormality. IMPRESSION: VASCULAR Aortic atherosclerosis. No active extravasation of contrast to localize GI bleed. NON-VASCULAR Changes of cirrhosis and hepatic steatosis. Stable large volume ascites. Distal esophageal wall thickening, new since prior study. This could reflect esophagitis. Electronically Signed   By: Franky Crease M.D.   On: 10/03/2023 19:34   CT ABDOMEN PELVIS W CONTRAST Result Date: 09/29/2023 CLINICAL DATA:  Severe abdominal pain and swelling. Lower GI bleed, diarrhea and rectal bleeding. Ascites with history of cirrhosis. EXAM: CT ABDOMEN AND PELVIS WITH CONTRAST TECHNIQUE: Multidetector CT imaging of the abdomen and pelvis was performed using the standard protocol following bolus administration of intravenous contrast. RADIATION DOSE REDUCTION: This exam was performed according to the departmental dose-optimization program which includes automated exposure control, adjustment of the mA and/or kV according to patient size and/or use of iterative reconstruction technique. CONTRAST:  OMNIPAQUE  IOHEXOL  300 MG/ML  SOLN COMPARISON:  09/17/2023. FINDINGS: Lower chest: No acute abnormality. Hepatobiliary: A few stable scattered hypodensities  are noted in the liver. Heterogeneous enhancement of the liver is noted with irregular capsular contour, compatible with history of cirrhosis. Steatosis is also noted. The gallbladder is without stones. No biliary ductal dilatation. Pancreas: Unremarkable. No pancreatic ductal dilatation or surrounding inflammatory changes. Spleen: Normal in size without focal abnormality. Adrenals/Urinary Tract: The adrenal glands  are within normal limits. The kidneys enhance symmetrically. No renal calculus or hydronephrosis bilaterally. The bladder is unremarkable. Stomach/Bowel: The stomach is within normal limits. No bowel obstruction, free air, or pneumatosis is seen. A few scattered diverticula at are noted along the colon without evidence of diverticulitis. The appendix is not seen. There is marked colonic wall thickening involving the cecum and proximal ascending colon. Vascular/Lymphatic: Aortic atherosclerosis. The portal vein, splenic vein, and superior mesenteric veins are patent. The umbilical vein appears prominent. Varices are noted in the mesentery. No abdominal or pelvic lymphadenopathy by size criteria. Reproductive: Uterus and bilateral adnexa are unremarkable. Other: Large ascites. Musculoskeletal: Degenerative changes are noted in the thoracolumbar spine. Stable compression deformities are noted in the superior endplates at T12 and T9. No acute osseous abnormality. IMPRESSION: 1. Marked colonic wall thickening involving the cecum and ascending colon, which may be related to colitis versus portal colopathy and unchanged from prior exams. 2. Large ascites. 3. Morphologic changes of cirrhosis and portal hypertension. 4. Aortic atherosclerosis. Electronically Signed   By: Leita Birmingham M.D.   On: 09/29/2023 18:05   CT ABDOMEN PELVIS W CONTRAST Result Date: 09/17/2023 CLINICAL DATA:  Abdominal pain, post-op Bowel obstruction suspected. Peritonitis or perforation suspected. Severe R sided abd pain, distention and recent abd surgery. EXAM: CT ABDOMEN AND PELVIS WITH CONTRAST TECHNIQUE: Multidetector CT imaging of the abdomen and pelvis was performed using the standard protocol following bolus administration of intravenous contrast. RADIATION DOSE REDUCTION: This exam was performed according to the departmental dose-optimization program which includes automated exposure control, adjustment of the mA and/or kV according to  patient size and/or use of iterative reconstruction technique. CONTRAST:  OMNIPAQUE  IOHEXOL  300 MG/ML  SOLN COMPARISON:  CT scan abdomen and pelvis from 09/14/2023. FINDINGS: Lower chest: There are subpleural atelectatic changes in the visualized lung bases. No overt consolidation. No pleural effusion. The heart is normal in size. No pericardial effusion. Hepatobiliary: The liver is normal in size. There is heterogeneous liver attenuation and subtle surface nodularity, favoring cirrhosis. No suspicious mass. These is marked diffuse hepatic steatosis. No intrahepatic or extrahepatic bile duct dilation. No calcified gallstones. Normal gallbladder wall thickness. No pericholecystic inflammatory changes. Pancreas: Unremarkable. No pancreatic ductal dilatation or surrounding inflammatory changes. Spleen: Within normal limits. No focal lesion. Adrenals/Urinary Tract: Adrenal glands are unremarkable. No suspicious renal mass. No hydronephrosis. No renal or ureteric calculi. Unremarkable urinary bladder. Stomach/Bowel: No disproportionate dilation of the small or large bowel loops. No evidence of abnormal bowel wall thickening or inflammatory changes. The appendix is unremarkable. Vascular/Lymphatic: There is moderate ascites. No pneumoperitoneum. There is mild mesenteric edema, similar to the prior study. No abdominal or pelvic lymphadenopathy, by size criteria. No aneurysmal dilation of the major abdominal arteries. There are mild peripheral atherosclerotic vascular calcifications of the aorta and its major branches. Small amount of venous collaterals noted along the lower thoracic esophagus/GE junction and in the left upper abdomen along the gastric wall, which are nonspecific but commonly seen as a sequela of portal hypertension. Reproductive: The uterus is unremarkable. No large adnexal mass. Other: Midabdominal midline surgical scar noted. The soft tissues and abdominal wall are otherwise unremarkable.  Musculoskeletal: No suspicious osseous lesions. There are mild multilevel degenerative changes in the visualized spine. Redemonstration of mild anterior wedging deformity of T12 vertebra. Subacute/healing posteromedial left ninth and tenth rib fractures noted. IMPRESSION: 1. No acute inflammatory process identified within the abdomen or pelvis. No bowel obstruction. 2. Multiple other nonacute observations, as described above. Aortic Atherosclerosis (ICD10-I70.0). Electronically Signed   By: Ree Molt M.D.   On: 09/17/2023 13:13   CT ABDOMEN PELVIS W CONTRAST Result Date: 09/14/2023 CLINICAL DATA:  Abdominal pain, acute, nonlocalized with diarrhea and blood-tinged emesis to 2-3 days. History of cirrhosis. Unable to keep food down. EXAM: CT ABDOMEN AND PELVIS WITH CONTRAST TECHNIQUE: Multidetector CT imaging of the abdomen and pelvis was performed using the standard protocol following bolus administration of intravenous contrast. RADIATION DOSE REDUCTION: This exam was performed according to the departmental dose-optimization program which includes automated exposure control, adjustment of the mA and/or kV according to patient size and/or use of iterative reconstruction technique. CONTRAST:  OMNIPAQUE  IOHEXOL  300 MG/ML  SOLN COMPARISON:  CTs with IV contrast 08/31/2023 and 08/19/2023. FINDINGS: Lower chest: No acute abnormality. Hepatobiliary: Diffusely steatotic and heterogeneous liver, cirrhotic configuration with capsular nodularity over portions. No focal masses seen. Gallbladder is dilated to 11 cm in length. No stones or overt wall thickening. Perihepatic ascites mildly increased since August 22. Pancreas: No abnormality. Spleen: No abnormality.  No splenomegaly. Adrenals/Urinary Tract: Adrenal glands are unremarkable. Kidneys are normal, without renal calculi, focal lesion, or hydronephrosis. Bladder is unremarkable. Stomach/Bowel: Irregular thickening gastric antrum percutaneous pacing could  indicate severe peptic ulcer disease or infiltrating disease. Last EGD was 05/17/2023 and subsequently patient had repair of a gastric ulcer 08/05/2023. Small bowel is normal caliber without focal inflammatory reaction. An appendix is not seen. There are thickened folds in the ascending and transverse colon which could be due to colitis or portal colopathy. Vascular/Lymphatic: The hepatic portal vein is within normal caliber limits. Varices noted in the omentum and mesentery. There is aortic atherosclerosis without aneurysm, stenosis or dissection. No enlarged lymph nodes are seen. Reproductive: Uterus and bilateral adnexa are unremarkable. Other: Mild to moderate free ascites, increased. No free air. Generalized mesenteric congestive change. There are no incarcerated hernias. Laparotomy skin staples previously seen have been removed. Musculoskeletal: There is mild anterior wedging of the T12 vertebral body. Moderately advanced L4-5 facet hypertrophy with grade 1 spondylolisthesis. No acute or other significant osseous findings. Partially healed fractures of the left posterior ninth and tenth ribs are again shown. IMPRESSION: 1. Irregular thickening of the gastric antrum which could be due to severe peptic ulcer disease or infiltrating disease. Last EGD was 05/17/2023 and subsequently patient had repair of a gastric ulcer 08/05/2023. The wall thickening is not significantly changed. 2. Thickened folds in the ascending and transverse colon which could be due to colitis or portal colopathy. 3. Dilated but otherwise unremarkable gallbladder. 4. Cirrhotic liver with mild to moderate ascites, increased since August 22. 5. Increased mesenteric congestive change. 6. Aortic atherosclerosis. 7. Mild chronic anterior wedging of the T12 vertebral body. 8. Partially healed fractures of the left posterior ninth and tenth ribs. Aortic Atherosclerosis (ICD10-I70.0). Electronically Signed   By: Francis Quam M.D.   On: 09/14/2023  00:52  [4 week]  Assessment:   53 y/o female with history of etoh cirrhosis with ongoing etoh abuse complicated by ascites requiring LVAP (no SBP), anxiety, SVT, GERD, chronic diarrhea, rectal bleeding for 1 year, GI bleed 05/2023 with findings of PUD on EGD, gastric  perforation 07/2023 with exploratory laparotomy with gastrorrhaphy/omental patch/wedge liver biopsy/primary umbilical hernia repair presents with N/V, coffee-ground emesis, black stools with fresh blood, symptoms ongoing for 3 days.   GI bleeding: suspected UGI bleed with melena, coffee ground emesis. She has chronic intermittent brbpr, this could be separate issue at this time rather than rapid transit UGI bleed. Agree with covering for possible variceal bleeding with octreotide . She has history of complicated gastric ulcer history requiring surgery due to perforation back in July.  Last EGD in May she had evidence of portal hypertensive gastropathy, no esophageal varices at that time although cannot rule out varices now given her decompensated liver disease over the last several months and go ongoing alcohol abuse. CT concerning for varices in the mesentery. -Hgb 9.1 (9/20)-->8.3 (9/24)-->7.0 (today). One unit of prbcs hanging. Will prepare additional unit to have ready if needed. -platelets 70K -INR 2.2. vit K 10mg  IV received yesterday evening  -continues with chronic rectal bleeding, remote colonoscopy   Decompensated ETOH Cirrhosis/ETOH Hepatitis: -ongoing alcohol use -recurrent ascites, no prior SBP. Negative diagnostic tap in the ED 9/20.  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 Prognosis poor especially if she continues with ongoing alcohol use.  -DF of 62.8 with using PT control of 13 -no HE  Abdominal pain: -likely multifactorial including distention from tense ascites, etoh gastritis, cannot rule out ulcer disease.  -needs to stay on PPI BID  as outpatient -no SBP 9/20 on diagnostic tap -assess at time of EGD  Chronic rectal bleeding: -eventual outpatient colonoscopy    Plan:   Continue Rocephin , SBP prophylaxis in setting of cirrhosis/GI bleeding. Continue Octreotide .  IV PPI BID. Patient needs to remain on PPI BID as outpatient. Transfuse if Hgb <7. EGD with possible esophageal variceal banding today.  I have discussed the risks, alternatives, benefits with regards to but not limited to the risk of reaction to medication, bleeding, infection, perforation and the patient is agreeable to proceed. Written consent to be obtained. LVAP after GI bleeding addressed, prior to discharge Complete ETOH cessation. Eventual outpatient colonoscopy.   LOS: 1 day   We would like to thank you for the opportunity to participate in the care of [name].  Sonny RAMAN. Ezzard RIGGERS [facility] [phone] 9/25/20259:46 AM

## 2023-10-05 ENCOUNTER — Encounter (HOSPITAL_COMMUNITY): Payer: Self-pay | Admitting: Gastroenterology

## 2023-10-05 ENCOUNTER — Other Ambulatory Visit (HOSPITAL_COMMUNITY)

## 2023-10-05 DIAGNOSIS — K922 Gastrointestinal hemorrhage, unspecified: Secondary | ICD-10-CM | POA: Diagnosis not present

## 2023-10-05 DIAGNOSIS — D689 Coagulation defect, unspecified: Secondary | ICD-10-CM | POA: Diagnosis not present

## 2023-10-05 DIAGNOSIS — K766 Portal hypertension: Secondary | ICD-10-CM | POA: Diagnosis not present

## 2023-10-05 DIAGNOSIS — D62 Acute posthemorrhagic anemia: Secondary | ICD-10-CM | POA: Diagnosis not present

## 2023-10-05 DIAGNOSIS — K209 Esophagitis, unspecified without bleeding: Secondary | ICD-10-CM | POA: Diagnosis not present

## 2023-10-05 DIAGNOSIS — F1090 Alcohol use, unspecified, uncomplicated: Secondary | ICD-10-CM | POA: Diagnosis not present

## 2023-10-05 DIAGNOSIS — K253 Acute gastric ulcer without hemorrhage or perforation: Secondary | ICD-10-CM

## 2023-10-05 DIAGNOSIS — K7031 Alcoholic cirrhosis of liver with ascites: Secondary | ICD-10-CM | POA: Diagnosis not present

## 2023-10-05 DIAGNOSIS — K263 Acute duodenal ulcer without hemorrhage or perforation: Secondary | ICD-10-CM

## 2023-10-05 LAB — BASIC METABOLIC PANEL WITH GFR
Anion gap: 9 (ref 5–15)
Anion gap: 9 (ref 5–15)
BUN: <5 mg/dL — ABNORMAL LOW (ref 6–20)
BUN: <5 mg/dL — ABNORMAL LOW (ref 6–20)
CO2: 23 mmol/L (ref 22–32)
CO2: 23 mmol/L (ref 22–32)
Calcium: 7.3 mg/dL — ABNORMAL LOW (ref 8.9–10.3)
Calcium: 7.4 mg/dL — ABNORMAL LOW (ref 8.9–10.3)
Chloride: 91 mmol/L — ABNORMAL LOW (ref 98–111)
Chloride: 91 mmol/L — ABNORMAL LOW (ref 98–111)
Creatinine, Ser: 0.56 mg/dL (ref 0.44–1.00)
Creatinine, Ser: 0.56 mg/dL (ref 0.44–1.00)
GFR, Estimated: >60 mL/min (ref >60)
GFR, Estimated: >60 mL/min (ref >60)
Glucose, Bld: 75 mg/dL (ref 70–99)
Glucose, Bld: 75 mg/dL (ref 70–99)
Potassium: 3.7 mmol/L (ref 3.5–5.1)
Potassium: 3.6 mmol/L (ref 3.5–5.1)
Sodium: 123 mmol/L — ABNORMAL LOW (ref 135–145)
Sodium: 123 mmol/L — ABNORMAL LOW (ref 135–145)

## 2023-10-05 LAB — CBC
HCT: 24.8 % — ABNORMAL LOW (ref 36.0–46.0)
Hemoglobin: 8.7 g/dL — ABNORMAL LOW (ref 12.0–15.0)
MCH: 31.8 pg (ref 26.0–34.0)
MCHC: 35.1 g/dL (ref 30.0–36.0)
MCV: 90.5 fL (ref 80.0–100.0)
Platelets: 55 K/uL — ABNORMAL LOW (ref 150–400)
RBC: 2.74 MIL/uL — ABNORMAL LOW (ref 3.87–5.11)
RDW: 23.3 % — ABNORMAL HIGH (ref 11.5–15.5)
WBC: 4.3 K/uL (ref 4.0–10.5)
nRBC: 0 % (ref 0.0–0.2)

## 2023-10-05 LAB — GLUCOSE, CAPILLARY
Glucose-Capillary: 104 mg/dL — ABNORMAL HIGH (ref 70–99)
Glucose-Capillary: 135 mg/dL — ABNORMAL HIGH (ref 70–99)
Glucose-Capillary: 143 mg/dL — ABNORMAL HIGH (ref 70–99)
Glucose-Capillary: 155 mg/dL — ABNORMAL HIGH (ref 70–99)
Glucose-Capillary: 156 mg/dL — ABNORMAL HIGH (ref 70–99)
Glucose-Capillary: 62 mg/dL — ABNORMAL LOW (ref 70–99)
Glucose-Capillary: 71 mg/dL (ref 70–99)
Glucose-Capillary: 75 mg/dL (ref 70–99)

## 2023-10-05 LAB — HEPATIC FUNCTION PANEL
ALT: 28 U/L (ref 0–44)
AST: 103 U/L — ABNORMAL HIGH (ref 15–41)
Albumin: <1.5 g/dL — ABNORMAL LOW (ref 3.5–5.0)
Alkaline Phosphatase: 56 U/L (ref 38–126)
Bilirubin, Direct: 1.9 mg/dL — ABNORMAL HIGH (ref 0.0–0.2)
Indirect Bilirubin: 2.4 mg/dL — ABNORMAL HIGH (ref 0.3–0.9)
Total Bilirubin: 4.3 mg/dL — ABNORMAL HIGH (ref 0.0–1.2)
Total Protein: 5.6 g/dL — ABNORMAL LOW (ref 6.5–8.1)

## 2023-10-05 LAB — PROTIME-INR
INR: 2.1 — ABNORMAL HIGH (ref 0.8–1.2)
Prothrombin Time: 24.8 s — ABNORMAL HIGH (ref 11.4–15.2)

## 2023-10-05 LAB — PHOSPHORUS: Phosphorus: 2.4 mg/dL — ABNORMAL LOW (ref 2.5–4.6)

## 2023-10-05 MED ORDER — POTASSIUM PHOSPHATES 15 MMOLE/5ML IV SOLN
15.0000 mmol | Freq: Once | INTRAVENOUS | Status: AC
  Administered 2023-10-05: 15 mmol via INTRAVENOUS
  Filled 2023-10-05: qty 5

## 2023-10-05 MED ORDER — ONDANSETRON HCL 4 MG/2ML IJ SOLN
4.0000 mg | Freq: Four times a day (QID) | INTRAMUSCULAR | Status: DC
  Administered 2023-10-05 – 2023-10-06 (×3): 4 mg via INTRAVENOUS
  Filled 2023-10-05 (×3): qty 2

## 2023-10-05 MED ORDER — DEXTROSE 50 % IV SOLN
12.5000 g | INTRAVENOUS | Status: AC
  Administered 2023-10-05: 12.5 g via INTRAVENOUS
  Filled 2023-10-05: qty 50

## 2023-10-05 MED ORDER — VITAMIN K1 10 MG/ML IJ SOLN
5.0000 mg | Freq: Every day | INTRAMUSCULAR | Status: DC
  Administered 2023-10-05 – 2023-10-06 (×2): 5 mg via INTRAVENOUS
  Filled 2023-10-05 (×3): qty 0.5

## 2023-10-05 MED ORDER — DEXTROSE IN LACTATED RINGERS 5 % IV SOLN: INTRAVENOUS | Status: AC

## 2023-10-05 NOTE — Consult Note (Signed)
 Consultation  Referring Provider: CCM/Joan Wood Primary Care Physician:  Wood, Gloria, FNP Primary Gastroenterologist:  Joan Wood  Reason for Consultation: Decompensated EtOH induced cirrhosis, acute upper Wood bleed, grade 4 esophagitis with concern for necrosis  HPI: Joan Wood is a 53 y.o. female with history of recent ongoing EtOH abuse, decompensated alcohol-induced cirrhosis with ascites requiring recent paracenteses, also with history of SVT, chronic GERD. Patient had an acute Wood bleed in May 2025 and underwent EGD at that time with finding of grade D esophagitis with no active bleeding, mild portal gastropathy, 1 nonbleeding cratered gastric ulcer 15 mm in size, biopsies were done also noted 3 nonbleeding cratered gastric ulcers in the prepylorus, normal-appearing duodenum Biopsy showed reactive changes and no evidence for H. Pylori She then presented in July 2025 with a perforated gastric ulcer for which she underwent emergent surgery with laparotomy, gastrorrhaphy/omental patch and also had wedge liver biopsy and primary umbilical hernia repair..  Liver biopsy consistent with cirrhosis Patient recovered from that surgery and was discharged on 08/22/2023. I do not believe she has been on PPI at home Prior to outpatient large-volume paracentesis on 08/27/2023 Readmitted 09/01/2023 with complaints of nausea and vomiting and abdominal pain.  At that time she was still drinking alcohol but stating that she had cut back.  CT of the abdomen and pelvis with IV contrast showed minimal ascites, there was evidence of persistent ulceration in the gastric wall at the area of previous repair no evidence of perforation or She did not undergo endoscopic evaluation at that time.  She did have repeat paracentesis with cell counts negative for SBP. He was discharged on twice daily PPI and Carafate . She has had several ER visits since and Joan Wood presented to the ER on 10/03/2023 with  complaints of nausea vomiting, diarrhea which was very dark and abdominal pain. Repeat CT of the abdomen and pelvis showed evidence of esophagitis, portal colopathy and a stable large volume ascites. She underwent EGD yesterday per Joan Wood with finding of grade D esophagitis with no bleeding in the middle and lower third of the esophagus, there was diffuse severe mucosal changes with black discoloration in the middle third of the esophagus and lower third of the esophagus concerning for necrosis, severe portal hypertensive gastropathy in the entire stomach, 1 nonbleeding cratered gastric ulcer 20 mm and multiple nonbleeding superficial duodenal ulcers with clean base.  Due to concerns for risk of esophageal perforation in setting of decompensated cirrhosis decision was made to transferred her to Joan Wood for further management.  She has been hemodynamically stable overnight no further hematemesis, and no stooling overnight. She is awake and oriented complaining of ongoing abdominal pain which she has had over the past month or so and asking for liquids.  She denies any dysphagia or odynophagia.  She has been nauseated but has not vomited, Zofran  helping. Denies any aspirin or NSAID use, I do not see a PPI on her current med list, she seems unclear.  Patient had denied ongoing EtOH use but alcohol level from 09/29/2023 was 218.  She has not had any evidence of withdrawal overnight.  Labs today pro time 24.8/INR 2.0 WBC 4.3/hemoglobin 8.7/hematocrit 24.8 Sodium 123/potassium 3.7 BUN less than 5/creatinine 0.56 T. bili 4.3/alk phos 56/ALT 28/AST 103  MELD 3.0: 30 at 10/05/2023  6:40 AM MELD-Na: 28 at 10/05/2023  6:40 AM Calculated from: Serum Creatinine: 0.56 mg/dL (Using min of 1 mg/dL) at 0/73/7974  3:59 AM Serum Sodium: 123 mmol/L (Using  min of 125 mmol/L) at 10/05/2023  6:40 AM Total Bilirubin: 4.3 mg/dL at 0/73/7974  6:48 AM Serum Albumin : 1.5 g/dL at 0/73/7974  6:48 AM INR(ratio): 2.1 at  10/05/2023  3:51 AM Age at listing (hypothetical): 53 years Sex: Female at 10/05/2023  6:40 AM    Past Medical History:  Diagnosis Date   Anxiety    Dysrhythmia    GERD (gastroesophageal reflux disease)    Hernia of abdominal wall    History of hiatal hernia    Sleep apnea    SVT (supraventricular tachycardia)     Past Surgical History:  Procedure Laterality Date   ESOPHAGOGASTRODUODENOSCOPY N/A 05/17/2023   Procedure: EGD (ESOPHAGOGASTRODUODENOSCOPY);  Surgeon: Joan Carlin POUR, DO;  Location: [facility];  Service: Endoscopy;  Laterality: N/A;   ESOPHAGOGASTRODUODENOSCOPY N/A 10/04/2023   Procedure: EGD (ESOPHAGOGASTRODUODENOSCOPY);  Surgeon: Joan Deatrice FALCON, MD;  Location: [facility];  Service: Endoscopy;  Laterality: N/A;   FINGER FRACTURE SURGERY     FRACTURE SURGERY     GASTRORRHAPHY N/A 08/05/2023   Procedure: GASTRORRHAPHY;  Surgeon: Joan Manuelita BROCKS, MD;  Location: [facility];  Service: General;  Laterality: N/A;   LAPAROTOMY N/A 08/05/2023   Procedure: LAPAROTOMY, EXPLORATORY;  Surgeon: Joan Manuelita BROCKS, MD;  Location: [facility];  Service: General;  Laterality: N/A;   LIVER BIOPSY N/A 08/05/2023   Procedure: BIOPSY, LIVER;  Surgeon: Joan Manuelita BROCKS, MD;  Location: [facility];  Service: General;  Laterality: N/A;   right ankle repair     age 45   UMBILICAL HERNIA REPAIR N/A 08/05/2023   Procedure: REPAIR, HERNIA, UMBILICAL, ADULT;  Surgeon: Joan Manuelita BROCKS, MD;  Location: [facility];  Service: General;  Laterality: N/A;    Prior to Admission medications   Medication Sig Start Date End Date Taking? Authorizing Provider  alum & mag hydroxide-simeth (MAALOX/MYLANTA) 200-200-20 MG/5ML suspension Take 15 mLs by mouth every 6 (six) hours as needed for indigestion or heartburn.   Yes [provider]  ondansetron  (ZOFRAN ) 4 MG tablet Take 1 tablet (4 mg total) by mouth every 8 (eight) hours as needed for nausea or vomiting. 09/17/23  Yes Joan Jerilynn RAMAN, MD   oxyCODONE -acetaminophen  (PERCOCET/ROXICET) 5-325 MG tablet Take 1 tablet by mouth every 6 (six) hours as needed for severe pain (pain score 7-10). 09/29/23  Yes Upstill, Margit, PA-C  sertraline  (ZOLOFT ) 50 MG tablet Take 1 tablet (50 mg total) by mouth daily. 08/22/23  Yes Pearlean Manus, MD  Elastic Bandages & Supports (FITRITE BACK BRACE WITH PULLEY) MISC 1 Units by Does not apply route daily. 05/25/23   Bevely Doffing, FNP  ondansetron  (ZOFRAN -ODT) 4 MG disintegrating tablet Take 1 tablet (4 mg total) by mouth every 8 (eight) hours as needed for nausea or vomiting. Patient not taking: Reported on 10/03/2023 09/29/23   Odell Margit, PA-C  potassium chloride  SA (KLOR-CON  M) 20 MEQ tablet Take 2 tablets (40 mEq total) by mouth daily for 2 doses. Patient not taking: Reported on 10/03/2023 09/17/23 09/19/23  Joan Jerilynn RAMAN, MD    Current Facility-Administered Medications  Medication Dose Route Frequency Provider Last Rate Last Admin   0.9 %  sodium chloride  infusion (Manually program via Guardrails IV Fluids)   Intravenous Once Madera, Carlos, MD       albumin  human 25 % solution 25 g  25 g Intravenous Once PRN Ricky Fines, MD       albuterol  (PROVENTIL ) (2.5 MG/3ML) 0.083% nebulizer solution 2.5 mg  2.5 mg Nebulization Q2H PRN Ricky,  Eric, MD       cefTRIAXone  (ROCEPHIN ) 2 g in sodium chloride  0.9 % 100 mL IVPB  2 g Intravenous Q24H Ricky Eric, MD 200 mL/hr at 10/05/23 1000 Infusion Verify at 10/05/23 1000   Chlorhexidine  Gluconate Cloth 2 % PADS 6 each  6 each Topical Q0600 Ricky Eric, MD   6 each at 10/05/23 1002   dextrose  5 % in lactated ringers  infusion   Intravenous Continuous Antonetta Moccasin B, NP 50 mL/hr at 10/05/23 1000 Infusion Verify at 10/05/23 1000   fentaNYL  (SUBLIMAZE ) injection 25 mcg  25 mcg Intravenous Q2H PRN Antonetta Moccasin B, NP   25 mcg at 10/05/23 1117   LORazepam  (ATIVAN ) injection 1-4 mg  1-4 mg Intravenous Q1H PRN Ricky Eric, MD   1 mg at 10/04/23 9973    nicotine  (NICODERM CQ  - dosed in mg/24 hours) patch 21 mg  21 mg Transdermal Daily Ricky Eric, MD       octreotide  (SANDOSTATIN ) 500 mcg in sodium chloride  0.9 % 250 mL (2 mcg/mL) infusion  25 mcg/hr Intravenous Continuous Ricky Eric, MD 12.5 mL/hr at 10/05/23 1000 25 mcg/hr at 10/05/23 1000   ondansetron  (ZOFRAN ) injection 4 mg  4 mg Intravenous Q6H PRN Ricky Eric, MD   4 mg at 10/05/23 0950   Oral care mouth rinse  15 mL Mouth Rinse PRN Olalere, Adewale A, MD       pantoprazole  (PROTONIX ) injection 40 mg  40 mg Intravenous Q12H Ricky Eric, MD   40 mg at 10/05/23 9045   phytonadione  (VITAMIN K ) 5 mg in dextrose  5 % 50 mL IVPB  5 mg Intravenous Q1400 Taryn Shellhammer S, PA-C       potassium PHOSPHATE  15 mmol in dextrose  5 % 250 mL infusion  15 mmol Intravenous Once Antonetta Moccasin B, NP       promethazine  (PHENERGAN ) 6.25 mg/NS 50 mL IVPB  6.25 mg Intravenous Q6H PRN Antonetta Moccasin NOVAK, NP   Stopped at 10/05/23 0437   thiamine  (VITAMIN B1) injection 100 mg  100 mg Intravenous Daily Ricky Eric, MD   100 mg at 10/05/23 0950    Allergies as of 10/03/2023 - Review Complete 10/03/2023  Allergen Reaction Noted   Bee venom Anaphylaxis 08/14/2020   Fire ant Hives and Rash 08/05/2023   Latex Rash 05/11/2023    Family History  Problem Relation Age of Onset   Cancer Mother    Leukemia Mother    Multiple myeloma Mother    Cancer Father        unsure what kind   Cancer - Colon Neg Hx    Colon polyps Neg Hx     Social History   Socioeconomic History   Marital status: Single    Spouse name: Not on file   Number of children: 4   Years of education: Not on file   Highest education level: Not on file  Occupational History   Not on file  Tobacco Use   Smoking status: Every Day    Current packs/day: 0.50    Average packs/day: 0.7 packs/day for 38.6 years (28.5 ttl pk-yrs)    Types: Cigarettes    Start date: 03/2022   Smokeless tobacco: Never  Vaping Use   Vaping status:  Never Used  Substance and Sexual Activity   Alcohol use: Yes    Alcohol/week: 2.0 standard drinks of alcohol    Types: 2 Cans of beer per week    Comment: states she is cutting back on beer, reports  last beer nearly two weeks ago but positive alcohol level 5 days ago (09/29/23).   Drug use: Never   Sexual activity: Not Currently    Birth control/protection: Abstinence  Other Topics Concern   Not on file  Social History Narrative   Pt states she has had 4 children, however, one child died died of SIDS at age 8.5 months. Pt is divorced. Pt has a female roommate in  which she lives with. Pt states she is cutting down on drinking beer and now drinks 2-3 beers per week and had her last beer yesterday morning. Pt still smokes but is cutting down and only smokes 3 cigarettes per day now.   Social Drivers of Corporate investment banker Strain: Not on file  Food Insecurity: No Food Insecurity (10/03/2023)   Hunger Vital Sign    Worried About Running Out of Food in the Last Year: Never true    Ran Out of Food in the Last Year: Never true  Transportation Needs: No Transportation Needs (10/03/2023)   PRAPARE - Administrator, Civil Service (Medical): No    Lack of Transportation (Non-Medical): No  Recent Concern: Transportation Needs - Unmet Transportation Needs (08/20/2023)   PRAPARE - Administrator, Civil Service (Medical): Yes    Lack of Transportation (Non-Medical): Yes  Physical Activity: Not on file  Stress: Not on file  Social Connections: Socially Isolated (10/03/2023)   Social Connection and Isolation Panel    Frequency of Communication with Friends and Family: Three times a week    Frequency of Social Gatherings with Friends and Family: Never    Attends Religious Services: Never    Database administrator or Organizations: No    Attends Banker Meetings: Never    Marital Status: Divorced  Catering manager Violence: Not At Risk (10/03/2023)    Humiliation, Afraid, Rape, and Kick questionnaire    Fear of Current or Ex-Partner: No    Emotionally Abused: No    Physically Abused: No    Sexually Abused: No    Review of Systems: Pertinent positive and negative review of systems were noted in the above HPI section.  All other review of systems was otherwise negative.   Physical Exam: Vital signs in last 24 hours: Temp:  [98.1 F (36.7 C)-98.5 F (36.9 C)] 98.5 F (36.9 C) (09/26 0801) Pulse Rate:  [91-123] 98 (09/26 1030) Resp:  [8-27] 13 (09/26 1030) BP: (81-123)/(55-88) 107/72 (09/26 1030) SpO2:  [90 %-100 %] 96 % (09/26 1030) Weight:  [60.2 kg-63.2 kg] 63.2 kg (09/26 0500) Last BM Date : 10/03/23 General:   Alert,  Well-developed, chronically ill-appearing older white female, pleasant and cooperative in NAD Head:  Normocephalic and atraumatic. Eyes:  Sclera clear, no icterus.   Conjunctiva pink. Ears:  Normal auditory acuity. Nose:  No deformity, discharge,  or lesions. Mouth:  No deformity or lesions.   Neck:  Supple; no masses or thyromegaly. Lungs:  Clear throughout to auscultation.   No wheezes, crackles, or rhonchi.  Heart:  Regular rate and rhythm; no murmurs, clicks, rubs,  or gallops. Abdomen: Nontense ascites, she has rather diffuse tenderness, midline incisional scar healing no palpable mass, spleen tip palpable Rectal: Not done Msk:  Symmetrical without gross deformities. . Pulses:  Normal pulses noted. Extremities:  Without clubbing or edema. Neurologic:  Alert and  oriented x4;  grossly normal neurologically.  No asterixis Skin:  Intact without significant lesions or rashes.. Psych:  Alert  and cooperative. Normal mood and affect.  Intake/Output from previous day: 09/25 0701 - 09/26 0700 In: 1079.3 [I.V.:599.3; Blood:280; IV Piggyback:200] Out: 3350  Intake/Output this shift: Total I/O In: 80.8 [I.V.:47.7; IV Piggyback:33] Out: -   Lab Results: Recent Labs    10/03/23 1517 10/04/23 0307  10/04/23 1258 10/04/23 1904 10/04/23 2330 10/05/23 0351  WBC 6.3 6.9  --   --   --  4.3  HGB 8.3* 7.0*   < > 9.1* 8.5* 8.7*  HCT 24.1* 20.4*   < > 26.8* 24.9* 24.8*  PLT 83* 70*  --   --   --  55*   < > = values in this interval not displayed.   BMET Recent Labs    10/04/23 0307 10/05/23 0351 10/05/23 0640  NA 123* 123* 123*  K 4.2 3.7 3.6  CL 90* 91* 91*  CO2 23 23 23   GLUCOSE 90 75 75  BUN <5* <5* <5*  CREATININE 0.44 0.56 0.56  CALCIUM  7.5* 7.3* 7.4*   LFT Recent Labs    10/05/23 0351  PROT 5.6*  ALBUMIN  <1.5*  AST 103*  ALT 28  ALKPHOS 56  BILITOT 4.3*  BILIDIR 1.9*  IBILI 2.4*   PT/INR Recent Labs    10/04/23 0307 10/05/23 0351  LABPROT 25.6* 24.8*  INR 2.2* 2.1*     IMPRESSION:  #7 53 year old white female with severely decompensated alcohol-induced cirrhosis complicated by portal hypertension/portal gastropathy, ascites, coagulopathy. Current MELD NA=28  #2 acute upper Wood bleed-presented to [facility] on 10[facility]/2023 with nausea vomiting possible hematemesis and melena EGD yesterday revealed grade D esophagitis with some areas concerning for possible esophageal necrosis in the middle and lower third of the esophagus.  She also had evidence of a cratered gastric ulcer, diffuse portal gastropathy and multiple duodenal ulcers.  Not sure why she has had persistent severe esophagitis, persistent gastric ulcer and development of new duodenal ulcers.  Denies aspirin or NSAIDs Not clear that she has been on PPI therapy for the duration since May 2025. I suspect her severe decompensated cirrhosis and hypoalbuminemia responsible for inability to heal  Patient has been stable overnight here no further active bleeding hemoglobin stable in the 8.7 range Not had any evidence of esophageal perforation and has no complaints of dysphagia or odynophagia.  #3 coagulopathy secondary to above #4 anemia acute on chronic secondary to above #5 hyponatremia secondary to  above #6 history of perforated gastric ulcer July 2025 status post laparotomy and omental patch as well as repair of umbilical hernia #7 ongoing complaints of abdominal pain suspect secondary to ulcer disease.  She does have persistent ascites-last paracentesis 09/03/2023  Rule out SBP  Plan; start sips of clear liquids Continue IV PPI twice daily Carafate  suspension 4 times daily Continue octreotide  Patient can be transferred out of the ICU as has been hemodynamically stable Vitamin K  5 mg IV daily x 2 CIWA protocol Check gastrin Will schedule for diagnostic and therapeutic paracentesis.  She is already on Rocephin  She will need eventual repeat EGD to document healing but as long as continues to do well would not anticipate repeat EGD this admission. Patient is at high risk for mortality with severely decompensated cirrhosis. Wood will continue to follow with you      Madina Galati PA-C 10/05/2023, 11:19 AM

## 2023-10-05 NOTE — Anesthesia Preprocedure Evaluation (Signed)
 Anesthesia Evaluation  Patient identified by MRN, date of birth, ID band Patient awake    Reviewed: Allergy & Precautions, H&P , NPO status , Patient's Chart, lab work & pertinent test results, reviewed documented beta blocker date and time   Airway Mallampati: II  TM Distance: >3 FB Neck ROM: full    Dental no notable dental hx.    Pulmonary sleep apnea , Current Smoker and Patient abstained from smoking.   Pulmonary exam normal breath sounds clear to auscultation       Cardiovascular Exercise Tolerance: Good hypertension, + dysrhythmias  Rhythm:regular Rate:Normal     Neuro/Psych  PSYCHIATRIC DISORDERS Anxiety Depression    negative neurological ROS     GI/Hepatic Neg liver ROS, hiatal hernia, PUD,GERD  ,,  Endo/Other  negative endocrine ROS    Renal/GU negative Renal ROS  negative genitourinary   Musculoskeletal   Abdominal   Peds  Hematology  (+) Blood dyscrasia, anemia   Anesthesia Other Findings   Reproductive/Obstetrics negative OB ROS                              Anesthesia Physical Anesthesia Plan  ASA: 3 and emergent  Anesthesia Plan: General   Post-op Pain Management:    Induction:   PONV Risk Score and Plan: Propofol  infusion  Airway Management Planned:   Additional Equipment:   Intra-op Plan:   Post-operative Plan:   Informed Consent: I have reviewed the patients History and Physical, chart, labs and discussed the procedure including the risks, benefits and alternatives for the proposed anesthesia with the patient or authorized representative who has indicated his/her understanding and acceptance.     Dental Advisory Given  Plan Discussed with: CRNA  Anesthesia Plan Comments:         Anesthesia Quick Evaluation

## 2023-10-05 NOTE — Progress Notes (Signed)
 Hypoglycemic Event  CBG: 62  Treatment: D50 25 mL (12.5 gm)  Symptoms: Shaky and Hungry  Follow-up CBG: Time:0830 CBG Result:135  Possible Reasons for Event: Inadequate meal intake      Joan Wood Joan Wood

## 2023-10-05 NOTE — Plan of Care (Signed)
  Problem: Education: Goal: Knowledge of General Education information will improve Description: Including pain rating scale, medication(s)/side effects and non-pharmacologic comfort measures Outcome: Progressing   Problem: Health Behavior/Discharge Planning: Goal: Ability to manage health-related needs will improve Outcome: Progressing   Problem: Clinical Measurements: Goal: Ability to maintain clinical measurements within normal limits will improve Outcome: Progressing Goal: Will remain free from infection Outcome: Progressing Goal: Diagnostic test results will improve Outcome: Progressing Goal: Cardiovascular complication will be avoided Outcome: Progressing   Problem: Elimination: Goal: Will not experience complications related to urinary retention Outcome: Progressing   Problem: Pain Managment: Goal: General experience of comfort will improve and/or be controlled Outcome: Progressing   Problem: Safety: Goal: Ability to remain free from injury will improve Outcome: Progressing   Problem: Skin Integrity: Goal: Risk for impaired skin integrity will decrease Outcome: Progressing   Problem: Clinical Measurements: Goal: Respiratory complications will improve Outcome: Not Progressing   Problem: Activity: Goal: Risk for activity intolerance will decrease Outcome: Not Progressing   Problem: Nutrition: Goal: Adequate nutrition will be maintained Outcome: Not Progressing   Problem: Coping: Goal: Level of anxiety will decrease Outcome: Not Progressing   Problem: Elimination: Goal: Will not experience complications related to bowel motility Outcome: Not Progressing

## 2023-10-05 NOTE — Progress Notes (Signed)
 NAME:  Joan Wood, MRN:  [id number], DOB:  10/24/1970, LOS: 2 ADMISSION DATE:  10/03/2023, CONSULTATION DATE:  10/04/2023 REFERRING MD: Ricky READ CHIEF COMPLAINT:  GI bleed    History of Present Illness:  53 year old female with past medical history of GERD, PUD with recent gastric ulcer perforation s/p ex lap, pancreatitis, sleep apnea, tobacco use, alcohol use disorder, decompensated cirrhosis who presented to [facility] ED on 9/24 with nausea, vomiting and bloody diarrhea beginning 9/22. While in the ED, noted to have coffee-ground emesis. Labs in ED notable for Hgb 8.3, plt 83, PT/INR 24/2, Na 123, K 3.3, bicarb 21, albumin  1.8, AST 138, tbili 5.6, AG 16, lipase negative, mag 1.4. CT angio GI showed no acute bleed, large volume ascites, distal esophageal thickening, new. She was given PPI, started on octreotide  gtt, rocephin , vitamin K , pain and nausea control and admitted to hospitalist. GI was consulted to evaluate 9/25 AM. Hgb with drop from 8.3 >7 and 1 U PRBC ordered. GI saw patient and proceeded with upper endoscopy showing black discoloration of esophageal mucosa concerning for acute esophageal necrosis, portal hypertension, non-bleeding gastric and duodenal ulcers. GI recommended transfer to [facility].   Pertinent  Medical History  GERD, PUD with recent gastric ulcer perforation s/p ex lap, pancreatitis, sleep apnea, tobacco use, alcohol use disorder, decompensated cirrhosis  Significant Hospital Events: Including procedures, antibiotic start and stop dates in addition to other pertinent events   9/24: ED for upper GI bleed admit to hospitalist  9/25: 1 U PRBC, GI upper endoscopy w/ acute esophageal necrosis with recommendation for transfer to [facility] > NPO, s/p paracentesis 3.35L drained  Interim History / Subjective:  Lower CBGs overnight Afebrile Hemodynamically stable overnight Multiple unmeasured urinary occurrences in bed- does not like purwick or bedpan Complains of AoC nausea and abd pain  which radiates around to back- similar symptoms she reports over several months.  No vomiting or BMs overnight.  Was able to get some sleep overnight   Objective   Blood pressure 99/70, pulse 96, temperature 98.5 F (36.9 C), temperature source Oral, resp. rate 11, height 5' 2 (1.575 m), weight 63.2 kg, SpO2 94%.        Intake/Output Summary (Last 24 hours) at 10/05/2023 0901 Last data filed at 10/05/2023 0600 Gross per 24 hour  Intake 1067.63 ml  Output 3350 ml  Net -2282.37 ml   Filed Weights   10/04/23 1048 10/04/23 1723 10/05/23 0500  Weight: 62.4 kg 60.2 kg 63.2 kg    Examination: General:  chronically ill appearing older than age female lying in bed in NAD HEENT: MM pink/moist, pupils 3/r Neuro: Awake, oriented x 3, MAE CV: rr, NSR, no murmur PULM:  non labored, CTA, RA GI: protuberant, soft, +ascites, mild diffuse tenderness Extremities: warm/dry, no LE edema  Skin: few telangiectasis to upper chest   Afebrile Labs> stable Na 123, K 3.6, phos 2.4, sCr 0.56, WBC 4.3, H/H 8.6/ 25> 8.7/24  Resolved Hospital Problem list    Assessment & Plan:  Concern for Acute upper GI Bleed  Concern for acute esophageal necrosis  Acute blood loss anemia  PUD with recent gastric ulcer perforation s/p ex lap 07/2023 Presented with 4 days of bloody diarrhea and coffee-ground emesis. EGD on 9/25 with concern for acute esophageal necrosis without perforation or active bleed.  - appreciate further recs per [facility].  Ok to transfer to PCU, no current ICU needs at this time - remains NPO - octreotide  gtt per GI - PPI  BID - trending H/H, remains stable.  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  Lipase normal. 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   Labs   CBC: Recent Labs  Lab 09/29/23 1417 10/03/23 1517 10/04/23 0307 10/04/23 1258 10/04/23 1904 10/04/23 2330 10/05/23 0351  WBC 4.5 6.3 6.9  --   --   --  4.3  NEUTROABS  --  5.0  --   --   --   --   --   HGB 9.1* 8.3* 7.0* 8.6* 9.1* 8.5* 8.7*  HCT 26.6* 24.1* 20.4* 25.6* 26.8* 24.9* 24.8*  MCV 89.6 92.7 94.4  --   --   --  90.5  PLT 93* 83* 70*  --   --   --  55*    Basic Metabolic Panel: Recent Labs  Lab 09/29/23 1417 09/29/23 1537 10/03/23 1713 10/03/23 2135 10/04/23 0307 10/04/23 1904 10/05/23 0351 10/05/23 0640  NA 125*  --  123*  --  123*  --  123* 123*  K 3.5  --  3.3*  --  4.2  --  3.7 3.6  CL 92*  --  86*  --  90*  --  91* 91*  CO2 20*  --  21*  --  23  --  23 23  GLUCOSE 84  --  94  --  90  --  75 75  BUN <5*  --  <5*  --  <5*  --  <5* <5*  CREATININE 0.41*  --  0.50  --  0.44  --  0.56 0.56  CALCIUM  7.5*  --  7.5*  --  7.5*  --  7.3* 7.4*  MG  --  1.6* 1.4*  --   --  2.1  --   --   PHOS  --   --   --  3.0  --   --  2.4*  --    GFR: Estimated Creatinine Clearance: 70.2 mL/min (by C-G formula based on SCr of 0.56 mg/dL). Recent Labs  Lab  09/29/23 1417 10/03/23 1517 10/04/23 0307 10/05/23 0351  WBC 4.5 6.3 6.9 4.3    Liver Function Tests: Recent Labs  Lab 09/29/23 1417 10/03/23 1713 10/04/23 0307 10/05/23 0351  AST 92* 138* 123* 103*  ALT 21 32 29 28  ALKPHOS 70 74 69 56  BILITOT 3.0* 5.6* 4.8* 4.3*  PROT 6.7 6.3* 6.2* 5.6*  ALBUMIN  1.9* 1.8* 1.7* <  1.5*   Recent Labs  Lab 10/03/23 1713  LIPASE 36   Recent Labs  Lab 10/04/23 1904  AMMONIA 22    ABG    Component Value Date/Time   TCO2 26 08/31/2023 2240     Coagulation Profile: Recent Labs  Lab 09/29/23 1523 10/03/23 1517 10/04/23 0307 10/05/23 0351  INR 1.6* 2.0* 2.2* 2.1*    Cardiac Enzymes: No results for input(s): CKTOTAL, CKMB, CKMBINDEX, TROPONINI in the last 168 hours.  HbA1C: Hgb A1c MFr Bld  Date/Time Value Ref Range Status  07/30/2023 09:39 AM 4.5 (L) 4.8 - 5.6 % Final    Comment:             Prediabetes: 5.7 - 6.4          Diabetes: >6.4          Glycemic control for adults with diabetes: <7.0   04/03/2023 10:41 AM 4.7 (L) 4.8 - 5.6 % Final    Comment:             Prediabetes: 5.7 - 6.4          Diabetes: >6.4          Glycemic control for adults with diabetes: <7.0     CBG: Recent Labs  Lab 10/04/23 1957 10/05/23 0340 10/05/23 0542 10/05/23 0758 10/05/23 0830  GLUCAP 91 75 71 62* 135*   Allergies Allergies  Allergen Reactions   Bee Venom Anaphylaxis    Cannot take epi for this allergy d/t heart condition per pt   Fire Ant Hives and Rash   Latex Rash    Latex band aids, gloves don't bother her     Home Medications  Prior to Admission medications   Medication Sig Start Date End Date Taking? Authorizing Provider  alum & mag hydroxide-simeth (MAALOX/MYLANTA) 200-200-20 MG/5ML suspension Take 15 mLs by mouth every 6 (six) hours as needed for indigestion or heartburn.   Yes [provider]  ondansetron  (ZOFRAN ) 4 MG tablet Take 1 tablet (4 mg total) by mouth every 8 (eight) hours as  needed for nausea or vomiting. 09/17/23  Yes Rogelia Jerilynn RAMAN, MD  oxyCODONE -acetaminophen  (PERCOCET/ROXICET) 5-325 MG tablet Take 1 tablet by mouth every 6 (six) hours as needed for severe pain (pain score 7-10). 09/29/23  Yes Upstill, Margit, PA-C  sertraline  (ZOLOFT ) 50 MG tablet Take 1 tablet (50 mg total) by mouth daily. 08/22/23  Yes Pearlean Manus, MD  Elastic Bandages & Supports (FITRITE BACK BRACE WITH PULLEY) MISC 1 Units by Does not apply route daily. 05/25/23   Bevely Doffing, FNP  ondansetron  (ZOFRAN -ODT) 4 MG disintegrating tablet Take 1 tablet (4 mg total) by mouth every 8 (eight) hours as needed for nausea or vomiting. Patient not taking: Reported on 10/03/2023 09/29/23   Odell Margit, PA-C  potassium chloride  SA (KLOR-CON  M) 20 MEQ tablet Take 2 tablets (40 mEq total) by mouth daily for 2 doses. Patient not taking: Reported on 10/03/2023 09/17/23 09/19/23  Rogelia Jerilynn RAMAN, MD     Critical care time: n/a     Lyle Pesa, NP [facility] 10/05/2023, 9:01 AM  See Amion for pager If no response to pager , please call [phone] until 7pm After 7:00 pm call Elink  [phone]

## 2023-10-05 NOTE — Anesthesia Postprocedure Evaluation (Signed)
 Anesthesia Post Note  Patient: Joan Wood  Procedure(s) Performed: EGD (ESOPHAGOGASTRODUODENOSCOPY)  Patient location during evaluation: Phase II Anesthesia Type: General Level of consciousness: awake Pain management: pain level controlled Vital Signs Assessment: post-procedure vital signs reviewed and stable Respiratory status: spontaneous breathing and respiratory function stable Cardiovascular status: blood pressure returned to baseline and stable Postop Assessment: no headache and no apparent nausea or vomiting Anesthetic complications: no Comments: Late entry   No notable events documented.   Last Vitals:  Vitals:   10/05/23 1300 10/05/23 1400  BP: (!) 94/55 99/74  Pulse: 97 95  Resp: 15 14  Temp:    SpO2: 95% 96%    Last Pain:  Vitals:   10/05/23 1200  TempSrc:   PainSc: 4                  Yvonna JINNY Bosworth

## 2023-10-05 NOTE — Plan of Care (Signed)

## 2023-10-06 DIAGNOSIS — K766 Portal hypertension: Secondary | ICD-10-CM | POA: Diagnosis not present

## 2023-10-06 DIAGNOSIS — R112 Nausea with vomiting, unspecified: Secondary | ICD-10-CM | POA: Diagnosis not present

## 2023-10-06 DIAGNOSIS — K253 Acute gastric ulcer without hemorrhage or perforation: Secondary | ICD-10-CM | POA: Diagnosis not present

## 2023-10-06 DIAGNOSIS — R197 Diarrhea, unspecified: Secondary | ICD-10-CM | POA: Diagnosis not present

## 2023-10-06 DIAGNOSIS — D62 Acute posthemorrhagic anemia: Secondary | ICD-10-CM | POA: Diagnosis not present

## 2023-10-06 DIAGNOSIS — K2289 Other specified disease of esophagus: Secondary | ICD-10-CM | POA: Diagnosis not present

## 2023-10-06 DIAGNOSIS — K209 Esophagitis, unspecified without bleeding: Secondary | ICD-10-CM | POA: Diagnosis not present

## 2023-10-06 DIAGNOSIS — K7031 Alcoholic cirrhosis of liver with ascites: Secondary | ICD-10-CM | POA: Diagnosis not present

## 2023-10-06 DIAGNOSIS — D689 Coagulation defect, unspecified: Secondary | ICD-10-CM | POA: Diagnosis not present

## 2023-10-06 LAB — CBC
HCT: 27.5 % — ABNORMAL LOW (ref 36.0–46.0)
Hemoglobin: 9.2 g/dL — ABNORMAL LOW (ref 12.0–15.0)
MCH: 30.9 pg (ref 26.0–34.0)
MCHC: 33.5 g/dL (ref 30.0–36.0)
MCV: 92.3 fL (ref 80.0–100.0)
Platelets: 58 K/uL — ABNORMAL LOW (ref 150–400)
RBC: 2.98 MIL/uL — ABNORMAL LOW (ref 3.87–5.11)
RDW: 23.9 % — ABNORMAL HIGH (ref 11.5–15.5)
WBC: 3.3 K/uL — ABNORMAL LOW (ref 4.0–10.5)
nRBC: 0 % (ref 0.0–0.2)

## 2023-10-06 LAB — HEPATIC FUNCTION PANEL
ALT: 24 U/L (ref 0–44)
AST: 70 U/L — ABNORMAL HIGH (ref 15–41)
Albumin: <1.5 g/dL — ABNORMAL LOW (ref 3.5–5.0)
Alkaline Phosphatase: 45 U/L (ref 38–126)
Bilirubin, Direct: 1.9 mg/dL — ABNORMAL HIGH (ref 0.0–0.2)
Indirect Bilirubin: 1.9 mg/dL — ABNORMAL HIGH (ref 0.3–0.9)
Total Bilirubin: 3.8 mg/dL — ABNORMAL HIGH (ref 0.0–1.2)
Total Protein: 4.5 g/dL — ABNORMAL LOW (ref 6.5–8.1)

## 2023-10-06 LAB — RENAL FUNCTION PANEL
Albumin: <1.5 g/dL — ABNORMAL LOW (ref 3.5–5.0)
Anion gap: 7 (ref 5–15)
BUN: <5 mg/dL — ABNORMAL LOW (ref 6–20)
CO2: 26 mmol/L (ref 22–32)
Calcium: 7.4 mg/dL — ABNORMAL LOW (ref 8.9–10.3)
Chloride: 91 mmol/L — ABNORMAL LOW (ref 98–111)
Creatinine, Ser: 0.55 mg/dL (ref 0.44–1.00)
GFR, Estimated: >60 mL/min (ref >60)
Glucose, Bld: 149 mg/dL — ABNORMAL HIGH (ref 70–99)
Phosphorus: 2.6 mg/dL (ref 2.5–4.6)
Potassium: 3.6 mmol/L (ref 3.5–5.1)
Sodium: 124 mmol/L — ABNORMAL LOW (ref 135–145)

## 2023-10-06 LAB — PROTIME-INR
INR: 2 — ABNORMAL HIGH (ref 0.8–1.2)
Prothrombin Time: 23.7 s — ABNORMAL HIGH (ref 11.4–15.2)

## 2023-10-06 LAB — GLUCOSE, CAPILLARY
Glucose-Capillary: 119 mg/dL — ABNORMAL HIGH (ref 70–99)
Glucose-Capillary: 121 mg/dL — ABNORMAL HIGH (ref 70–99)
Glucose-Capillary: 127 mg/dL — ABNORMAL HIGH (ref 70–99)
Glucose-Capillary: 143 mg/dL — ABNORMAL HIGH (ref 70–99)
Glucose-Capillary: 149 mg/dL — ABNORMAL HIGH (ref 70–99)
Glucose-Capillary: 150 mg/dL — ABNORMAL HIGH (ref 70–99)

## 2023-10-06 LAB — MAGNESIUM: Magnesium: 1.5 mg/dL — ABNORMAL LOW (ref 1.7–2.4)

## 2023-10-06 MED ORDER — ENSURE PLUS HIGH PROTEIN PO LIQD
237.0000 mL | Freq: Two times a day (BID) | ORAL | Status: DC
  Administered 2023-10-07 – 2023-10-17 (×20): 237 mL via ORAL

## 2023-10-06 MED ORDER — ONDANSETRON HCL 4 MG/2ML IJ SOLN
4.0000 mg | Freq: Four times a day (QID) | INTRAMUSCULAR | Status: AC | PRN
  Administered 2023-10-06 – 2023-10-08 (×5): 4 mg via INTRAVENOUS
  Filled 2023-10-06 (×5): qty 2

## 2023-10-06 MED ORDER — OXYCODONE HCL 5 MG PO TABS
5.0000 mg | ORAL_TABLET | ORAL | Status: DC | PRN
  Administered 2023-10-06 – 2023-12-21 (×236): 5 mg via ORAL
  Filled 2023-10-06 (×238): qty 1

## 2023-10-06 MED ORDER — MAGNESIUM SULFATE 2 GM/50ML IV SOLN
2.0000 g | Freq: Once | INTRAVENOUS | Status: AC
Start: 2023-10-06 — End: 2023-10-06
  Administered 2023-10-06: 2 g via INTRAVENOUS
  Filled 2023-10-06: qty 50

## 2023-10-06 MED ORDER — POTASSIUM CHLORIDE 20 MEQ PO PACK
40.0000 meq | PACK | Freq: Once | ORAL | Status: AC
  Administered 2023-10-06: 40 meq via ORAL
  Filled 2023-10-06: qty 2

## 2023-10-06 MED ORDER — FOLIC ACID 5 MG/ML IJ SOLN
1.0000 mg | Freq: Every day | INTRAMUSCULAR | Status: DC
  Administered 2023-10-06 – 2023-10-07 (×2): 1 mg via INTRAVENOUS
  Filled 2023-10-06 (×2): qty 0.2

## 2023-10-06 MED ORDER — BISACODYL 10 MG RE SUPP
10.0000 mg | Freq: Once | RECTAL | Status: AC
  Administered 2023-10-06: 10 mg via RECTAL
  Filled 2023-10-06: qty 1

## 2023-10-06 NOTE — Plan of Care (Signed)
  Problem: Coping: Goal: Level of anxiety will decrease Outcome: Not Progressing   Problem: Elimination: Goal: Will not experience complications related to bowel motility Outcome: Not Progressing   Problem: Education: Goal: Knowledge of General Education information will improve Description: Including pain rating scale, medication(s)/side effects and non-pharmacologic comfort measures Outcome: Progressing   Problem: Health Behavior/Discharge Planning: Goal: Ability to manage health-related needs will improve Outcome: Progressing   Problem: Clinical Measurements: Goal: Ability to maintain clinical measurements within normal limits will improve Outcome: Progressing Goal: Will remain free from infection Outcome: Progressing Goal: Diagnostic test results will improve Outcome: Progressing Goal: Respiratory complications will improve Outcome: Progressing Goal: Cardiovascular complication will be avoided Outcome: Progressing   Problem: Activity: Goal: Risk for activity intolerance will decrease Outcome: Progressing   Problem: Nutrition: Goal: Adequate nutrition will be maintained Outcome: Progressing   Problem: Elimination: Goal: Will not experience complications related to urinary retention Outcome: Progressing   Problem: Pain Managment: Goal: General experience of comfort will improve and/or be controlled Outcome: Progressing   Problem: Safety: Goal: Ability to remain free from injury will improve Outcome: Progressing   Problem: Skin Integrity: Goal: Risk for impaired skin integrity will decrease Outcome: Progressing

## 2023-10-06 NOTE — Plan of Care (Signed)
  Problem: Clinical Measurements: Goal: Ability to maintain clinical measurements within normal limits will improve Outcome: Progressing Goal: Will remain free from infection Outcome: Progressing Goal: Diagnostic test results will improve Outcome: Progressing Goal: Respiratory complications will improve Outcome: Progressing Goal: Cardiovascular complication will be avoided Outcome: Progressing   Problem: Coping: Goal: Level of anxiety will decrease Outcome: Progressing   Problem: Pain Managment: Goal: General experience of comfort will improve and/or be controlled Outcome: Progressing   Problem: Safety: Goal: Ability to remain free from injury will improve Outcome: Progressing

## 2023-10-06 NOTE — Progress Notes (Signed)
 Progress Note   Patient: Joan Wood FMW:[id number] DOB: 23-Feb-1970 DOA: 10/03/2023     3 DOS: the patient was seen and examined on 10/06/2023   Brief hospital course: Joan Wood is a 53 year old female with decompensated cirrhosis, PUD with recent gastric ulcer perforation s/p ex-lap, GERD, pancreatitis, OSA presented to [facility] with N/V/bloody diarrhea x 4 days. In the ED had coffee ground emesis. CTA GI neg for acute bleed, large ascites and new distal esophageal thickening. GI consulted for acute blood loss anemia with Hg 8.3>7. She was given PPI, started on octreotide  gtt, rocephin , vitamin K , pain and nausea control and admitted to hospitalist. GI was consulted to evaluate 9/25 AM. Hgb with drop from 8.3 >7 and 1 U PRBC ordered. GI saw patient and proceeded with upper endoscopy showing black discoloration of esophageal mucosa concerning for acute esophageal necrosis, portal hypertension, non-bleeding gastric and duodenal ulcers. Transferred to [facility] ICU for further management. She had paracentesis 10/04/23 with removed. GI following, continue PPI, octreotide , started clears, Hb remained stable transferred to [facility] service 10/06/23  Assessment and Plan: Acute upper GI bleed Acute esophageal necrosis Acute blood loss anemia Patient has h/o PUD, gastric ulcer perforation s/p sx lap 07/2023. GI follow up appreciated. Possible plan for EGD.  Started clears, she is able to tolerate. Hb stable above 9. Trend H/H and transfuse if Hb less than 7. Continue IV PPI, octreotide . Continue antiemetics as needed.  Decompensated alcoholic liver cirrhosis Portal gastropathy Hyperbilirubinemia- Ascites- s/p paracentesis 9/25 3.3L removed. Continue rocephin  for SBP prophylaxis. Continue to monitor LFT. Alcohol cessation advised. Continue thiamine , folate and multivitamin. Avoid heparin for DVT ppx.  Coagulopathy Thrombocytopenia  From liver dysfunction. Coags stable. She got vitamin  K.  Hyponatremia- seems chronic. In the setting of liver failure. Na stable.  Hypomagnesemia Replete and recheck.  Tobacco abuse- Continue nicotine  patch.  Alcohol abuse- CIWA protocol. Continue thiamine , folate, MVT. Hypoglycemia protocol. Replete electrolytes as needed.     Out of bed to chair. Incentive spirometry. Nursing supportive care. Fall, aspiration precautions. Diet:  Diet Orders (From admission, onward)     Start     Ordered   10/05/23 1118  Diet clear liquid Room service appropriate? Yes; Fluid consistency: Thin  Diet effective now       Comments: Sips only today and ice chips  Question Answer Comment  Room service appropriate? Yes   Fluid consistency: Thin      10/05/23 1119           DVT prophylaxis: SCDs Start: 10/03/23 2153  Level of care: Progressive   Code Status: Full Code  Subjective: Patient is seen and examined today morning. She is lying in bed, eating jello. RN at bedside. No active bleeding. Denies any complaints. Asks for regular diet.  Physical Exam: Vitals:   10/06/23 0700 10/06/23 0740 10/06/23 0800 10/06/23 0900  BP: 98/65  97/64 100/78  Pulse: 91  92 94  Resp: 13  13 15   Temp:  98.4 F (36.9 C)    TempSrc:  Oral    SpO2: 95%  95% 94%  Weight:      Height:        General - Elderly thin built Caucasian weak female, no apparent distress HEENT - PERRLA, EOMI, pale conjunctiva, non tender sinuses. Lung - Clear, basal rales, rhonchi, no wheezes. Heart - S1, S2 heard, no murmurs, rubs, no pedal edema. Abdomen - Soft, non tender, distended, bowel sounds good Neuro - Alert, awake and  oriented, non focal exam. Skin - Warm and dry.  Data Reviewed:      Latest Ref Rng & Units 10/06/2023    2:45 AM 10/05/2023    3:51 AM 10/04/2023   11:30 PM  CBC  WBC 4.0 - 10.5 K/uL 3.3  4.3    Hemoglobin 12.0 - 15.0 g/dL 9.2  8.7  8.5   Hematocrit 36.0 - 46.0 % 27.5  24.8  24.9   Platelets 150 - 400 K/uL 58  55        Latest Ref  Rng & Units 10/06/2023    2:45 AM 10/05/2023    6:40 AM 10/05/2023    3:51 AM  BMP  Glucose 70 - 99 mg/dL 850  75  75   BUN 6 - 20 mg/dL <5  <5  <5   Creatinine 0.44 - 1.00 mg/dL 9.44  9.43  9.43   Sodium 135 - 145 mmol/L 124  123  123   Potassium 3.5 - 5.1 mmol/L 3.6  3.6  3.7   Chloride 98 - 111 mmol/L 91  91  91   CO2 22 - 32 mmol/L 26  23  23    Calcium  8.9 - 10.3 mg/dL 7.4  7.4  7.3    No results found.  Family Communication: Discussed with patient, she understand and agree. All questions answered.  Disposition: Status is: Inpatient Remains inpatient appropriate because: IV PPI, octreotide , hb monitoring, liver failure.  Planned Discharge Destination: Home with Home Health vs rehab     Time spent: 55 minutes  Author: Concepcion Riser, MD 10/06/2023 10:27 AM Secure chat 7am to 7pm For on call review [email or web address].

## 2023-10-06 NOTE — Progress Notes (Signed)
 Patient ID: Joan Wood, female   DOB: 1970/03/06, 53 y.o.   MRN: [id number]    Progress Note   Subjective   Day # 3 CC; grade D esophagitis, nonhealing with concern for component of necrosis, no evidence of perforation, recent perforated gastric ulcer status post laparotomy and omental patch July 2025, decompensated EtOH induced cirrhosis presenting to [facility] with nausea vomiting hematemesis and melena  IV PPI twice daily IV vitamin K  x 2 days  EGD on 10/04/2023/[place] showed grade D esophagitis, no bleeding, diffuse severe mucosal changes with black discoloration in the middle third of the esophagus and lower third of the esophagus concerning for necrosis, severe portal hypertensive gastropathy in the entire stomach and 1 nonbleeding cratered gastric ulcer 20 mm and multiple new nonbleeding superficial duodenal ulcers with clean base.  Transferred here for closer monitoring Patient has not had any evidence of further active GI bleeding, she is complaining of some odynophagia only to the beef broth today not having any ongoing chest pain, she continues to complain of fullness tightness and discomfort in her abdomen particularly in the lower abdomen  Labs today WBC 3.3/hemoglobin 9.2/hematocrit 27.5 stable/platelets 58 Pro time 23.7/INR 2.0  MELD-Na=28  Gastrin pending     Objective   Vital signs in last 24 hours: Temp:  [97.7 F (36.5 C)-98.5 F (36.9 C)] 97.7 F (36.5 C) (09/27 1133) Pulse Rate:  [90-104] 91 (09/27 1400) Resp:  [12-24] 15 (09/27 1400) BP: (85-132)/(48-88) 92/64 (09/27 1400) SpO2:  [88 %-99 %] 99 % (09/27 1400) Last BM Date : 10/03/23 General: Older white female in NAD sleeping when entered the room, easily aroused Heart:  Regular rate and rhythm; no murmurs Lungs: Respirations even and unlabored, lungs CTA bilaterally Abdomen:  Soft, protuberant with nontense ascites, mild diffuse tenderness, no guarding or rebound normal bowel sounds. Extremities:   Without edema. Neurologic:  Alert and oriented,  grossly normal neurologically. Psych:  Cooperative. Normal mood and affect.  Intake/Output from previous day: 09/26 0701 - 09/27 0700 In: 1775.2 [I.V.:1319.3; IV Piggyback:455.9] Out: 100 [Urine:100] Intake/Output this shift: Total I/O In: 806.3 [P.O.:320; I.V.:384.8; IV Piggyback:101.4] Out: 60 [Urine:60]  Lab Results: Recent Labs    10/04/23 0307 10/04/23 1258 10/04/23 2330 10/05/23 0351 10/06/23 0245  WBC 6.9  --   --  4.3 3.3*  HGB 7.0*   < > 8.5* 8.7* 9.2*  HCT 20.4*   < > 24.9* 24.8* 27.5*  PLT 70*  --   --  55* 58*   < > = values in this interval not displayed.   BMET Recent Labs    10/05/23 0351 10/05/23 0640 10/06/23 0245  NA 123* 123* 124*  K 3.7 3.6 3.6  CL 91* 91* 91*  CO2 23 23 26   GLUCOSE 75 75 149*  BUN <5* <5* <5*  CREATININE 0.56 0.56 0.55  CALCIUM  7.3* 7.4* 7.4*   LFT Recent Labs    10/05/23 0351 10/06/23 0245  PROT 5.6*  --   ALBUMIN  <1.5* <1.5*  AST 103*  --   ALT 28  --   ALKPHOS 56  --   BILITOT 4.3*  --   BILIDIR 1.9*  --   IBILI 2.4*  --    PT/INR Recent Labs    10/05/23 0351 10/06/23 0245  LABPROT 24.8* 23.7*  INR 2.1* 2.0*       Assessment / Plan:    #54 53 year old white female with severely decompensated EtOH induced cirrhosis complicated by portal hypertension, portal  gastropathy, ascites and coagulopathy MELD-Na on admit 28  #2 acute upper GI bleed-admitted 10/03/2023 with nausea vomiting possible hematemesis and melena EGD showed grade D esophagitis with some areas concerning for possible esophageal necrosis in the mid and lower third of the esophagus also with evidence of a cratered gastric ulcer nonbleeding diffuse portal gastropathy and multiple new duodenal ulcers.  Patient also had grade D esophagitis on EGD in May 2025 as well as the gastric ulcer.  Unclear why she has had persistence of such severe esophagitis, persistent gastric ulcer and then development of  new duodenal ulcers. H. pylori negative with previous biopsies Believes she has been on twice daily PPI , did not have PPI listed on her med list on admission. I suspect she also has very poor healing due to decompensated cirrhosis and hypoalbuminemia  Fortunately she has been stable here no evidence of decompensation, labs stable no leukocytosis She does have some odynophagia with beef broth particularly  #3 anemia-stable #4 coagulopathy-stable- #5 status post perforated gastric ulcer July 2025 status post laparotomy and omental patch as well as repair of umbilical hernia  #6 ongoing complaints of abdominal pain suspect primarily secondary to ulcer disease she does have persistent ascites but paracentesis nonstick at bedside this admission no evidence of SBP  Plan; full liquid diet would not plan to advance beyond full liquid diet for now Continue IV PPI twice daily with plan at least a 5-day course Continue octreotide  today if no further evidence of bleeding will stop tomorrow Continue Carafate  suspension 4 times daily Await gastrin level CIWA protocol  She will need eventual EGD, timing to be determined pending her course Patient is at high risk for mortality given severely decompensated cirrhosis and MELD NA of 28  GI will continue to follow          Principal Problem:   GI bleed Active Problems:   Alcohol use disorder   Hyponatremia   Tobacco abuse   Nausea vomiting and diarrhea   Acute on chronic blood loss anemia   Liver cirrhosis (HCC)   Esophageal necrosis     LOS: 3 days   Catheline Hixon PA-C 10/06/2023, 3:03 PM

## 2023-10-07 DIAGNOSIS — R197 Diarrhea, unspecified: Secondary | ICD-10-CM | POA: Diagnosis not present

## 2023-10-07 DIAGNOSIS — R112 Nausea with vomiting, unspecified: Secondary | ICD-10-CM | POA: Diagnosis not present

## 2023-10-07 DIAGNOSIS — K253 Acute gastric ulcer without hemorrhage or perforation: Secondary | ICD-10-CM | POA: Diagnosis not present

## 2023-10-07 DIAGNOSIS — D62 Acute posthemorrhagic anemia: Secondary | ICD-10-CM | POA: Diagnosis not present

## 2023-10-07 DIAGNOSIS — K7031 Alcoholic cirrhosis of liver with ascites: Secondary | ICD-10-CM | POA: Diagnosis not present

## 2023-10-07 DIAGNOSIS — K209 Esophagitis, unspecified without bleeding: Secondary | ICD-10-CM | POA: Diagnosis not present

## 2023-10-07 DIAGNOSIS — K2289 Other specified disease of esophagus: Secondary | ICD-10-CM | POA: Diagnosis not present

## 2023-10-07 LAB — CBC
HCT: 26 % — ABNORMAL LOW (ref 36.0–46.0)
Hemoglobin: 8.8 g/dL — ABNORMAL LOW (ref 12.0–15.0)
MCH: 31.7 pg (ref 26.0–34.0)
MCHC: 33.8 g/dL (ref 30.0–36.0)
MCV: 93.5 fL (ref 80.0–100.0)
Platelets: 51 K/uL — ABNORMAL LOW (ref 150–400)
RBC: 2.78 MIL/uL — ABNORMAL LOW (ref 3.87–5.11)
RDW: 24.3 % — ABNORMAL HIGH (ref 11.5–15.5)
WBC: 4.3 K/uL (ref 4.0–10.5)
nRBC: 0 % (ref 0.0–0.2)

## 2023-10-07 LAB — TYPE AND SCREEN
ABO/RH(D): A POS
Antibody Screen: NEGATIVE
Unit division: 0
Unit division: 0

## 2023-10-07 LAB — BPAM RBC
Blood Product Expiration Date: 202510182359
Blood Product Expiration Date: 202510252359
ISSUE DATE / TIME: 202509250800
Unit Type and Rh: 6200
Unit Type and Rh: 6200

## 2023-10-07 LAB — RENAL FUNCTION PANEL
Albumin: <1.5 g/dL — ABNORMAL LOW (ref 3.5–5.0)
Anion gap: 6 (ref 5–15)
BUN: <5 mg/dL — ABNORMAL LOW (ref 6–20)
CO2: 24 mmol/L (ref 22–32)
Calcium: 7.3 mg/dL — ABNORMAL LOW (ref 8.9–10.3)
Chloride: 95 mmol/L — ABNORMAL LOW (ref 98–111)
Creatinine, Ser: 0.49 mg/dL (ref 0.44–1.00)
GFR, Estimated: >60 mL/min (ref >60)
Glucose, Bld: 111 mg/dL — ABNORMAL HIGH (ref 70–99)
Phosphorus: 2.5 mg/dL (ref 2.5–4.6)
Potassium: 3.7 mmol/L (ref 3.5–5.1)
Sodium: 125 mmol/L — ABNORMAL LOW (ref 135–145)

## 2023-10-07 LAB — GLUCOSE, CAPILLARY
Glucose-Capillary: 102 mg/dL — ABNORMAL HIGH (ref 70–99)
Glucose-Capillary: 104 mg/dL — ABNORMAL HIGH (ref 70–99)
Glucose-Capillary: 110 mg/dL — ABNORMAL HIGH (ref 70–99)
Glucose-Capillary: 110 mg/dL — ABNORMAL HIGH (ref 70–99)
Glucose-Capillary: 91 mg/dL (ref 70–99)
Glucose-Capillary: 97 mg/dL (ref 70–99)

## 2023-10-07 LAB — PROTIME-INR
INR: 2.2 — ABNORMAL HIGH (ref 0.8–1.2)
Prothrombin Time: 25.1 s — ABNORMAL HIGH (ref 11.4–15.2)

## 2023-10-07 MED ORDER — PANTOPRAZOLE SODIUM 40 MG PO TBEC
40.0000 mg | DELAYED_RELEASE_TABLET | Freq: Every day | ORAL | Status: DC
  Administered 2023-10-08: 40 mg via ORAL
  Filled 2023-10-07: qty 1

## 2023-10-07 MED ORDER — PHYTONADIONE 5 MG PO TABS
5.0000 mg | ORAL_TABLET | Freq: Once | ORAL | Status: AC
  Administered 2023-10-07: 5 mg via ORAL
  Filled 2023-10-07: qty 1

## 2023-10-07 MED ORDER — ADULT MULTIVITAMIN W/MINERALS CH
1.0000 | ORAL_TABLET | Freq: Every day | ORAL | Status: AC
  Administered 2023-10-07 – 2024-02-15 (×132): 1 via ORAL
  Filled 2023-10-07 (×125): qty 1

## 2023-10-07 MED ORDER — FLUCONAZOLE 100 MG PO TABS: 100.0000 mg | ORAL_TABLET | Freq: Every day | ORAL | Status: DC

## 2023-10-07 MED ORDER — LIDOCAINE 5 % EX PTCH: 1.0000 | MEDICATED_PATCH | CUTANEOUS | Status: DC

## 2023-10-07 MED ORDER — ALBUMIN HUMAN 25 % IV SOLN
25.0000 g | Freq: Three times a day (TID) | INTRAVENOUS | Status: DC
  Administered 2023-10-07: 25 g via INTRAVENOUS
  Administered 2023-10-07 (×2): 12.5 g via INTRAVENOUS
  Administered 2023-10-08 (×2): 25 g via INTRAVENOUS
  Filled 2023-10-07 (×5): qty 100

## 2023-10-07 MED ORDER — ALUM & MAG HYDROXIDE-SIMETH 200-200-20 MG/5ML PO SUSP
30.0000 mL | Freq: Four times a day (QID) | ORAL | Status: AC | PRN
  Administered 2023-10-07 – 2024-02-01 (×6): 30 mL via ORAL
  Filled 2023-10-07 (×7): qty 30

## 2023-10-07 MED ORDER — FLUCONAZOLE 150 MG PO TABS
150.0000 mg | ORAL_TABLET | Freq: Every day | ORAL | Status: AC
  Administered 2023-10-07: 150 mg via ORAL
  Filled 2023-10-07: qty 1

## 2023-10-07 MED ORDER — LIDOCAINE 5 % EX PTCH
2.0000 | MEDICATED_PATCH | CUTANEOUS | Status: AC
  Administered 2023-10-07 – 2024-02-14 (×121): 2 via TRANSDERMAL
  Filled 2023-10-07 (×125): qty 2

## 2023-10-07 MED ORDER — FOLIC ACID 1 MG PO TABS
1.0000 mg | ORAL_TABLET | Freq: Every day | ORAL | Status: AC
  Administered 2023-10-08 – 2024-02-15 (×131): 1 mg via ORAL
  Filled 2023-10-07 (×125): qty 1

## 2023-10-07 MED ORDER — THIAMINE MONONITRATE 100 MG PO TABS
100.0000 mg | ORAL_TABLET | Freq: Every day | ORAL | Status: AC
  Administered 2023-10-08 – 2024-02-15 (×131): 100 mg via ORAL
  Filled 2023-10-07 (×124): qty 1

## 2023-10-07 MED ORDER — CALCIUM CARBONATE ANTACID 500 MG PO CHEW
1.0000 | CHEWABLE_TABLET | Freq: Three times a day (TID) | ORAL | Status: AC
  Administered 2023-10-07 – 2024-02-15 (×380): 200 mg via ORAL
  Filled 2023-10-07 (×360): qty 1

## 2023-10-07 MED ORDER — POTASSIUM CHLORIDE 20 MEQ PO PACK
40.0000 meq | PACK | Freq: Once | ORAL | Status: AC
  Administered 2023-10-07: 40 meq via ORAL
  Filled 2023-10-07: qty 2

## 2023-10-07 NOTE — Plan of Care (Signed)

## 2023-10-07 NOTE — Evaluation (Signed)
 Physical Therapy Evaluation Patient Details Name: Joan Wood MRN: [id number] DOB: [birth year] Today's Date: 10/07/2023  History of Present Illness  53 y.o. female presents to [facility] hospital on 10/03/2023 with nausea/vomiting and bloody diarrhea. Pt underwent upper EGD on 9/25, concerning for possible acute esophageal necrosis. Pt also underwent paracentesis on 9/25. PMH includes decompensated cirrhosis, PUD, GERD, pancreatitis, OSA.  Clinical Impression  Pt presents to PT with deficits in functional mobility, strength, power, endurance, gait, balance. Pt is limited by generalized weakness and fatigue, requiring physical assistance from this PT for bed mobility and transfers. Pt is at a high falls risk due to LE weakness. Pt lacks reliable caregiver support at home during the week. Patient will benefit from continued inpatient follow up therapy, <3 hours/day.        If plan is discharge home, recommend the following: A lot of help with walking and/or transfers;A lot of help with bathing/dressing/bathroom;Assistance with cooking/housework;Assist for transportation;Help with stairs or ramp for entrance   Can travel by private vehicle   No    Equipment Recommendations Hospital bed;BSC/3in1  Recommendations for Other Services       Functional Status Assessment Patient has had a recent decline in their functional status and demonstrates the ability to make significant improvements in function in a reasonable and predictable amount of time.     Precautions / Restrictions Precautions Precautions: Fall Recall of Precautions/Restrictions: Impaired Restrictions Weight Bearing Restrictions Per Provider Order: No      Mobility  Bed Mobility Overal bed mobility: Needs Assistance Bed Mobility: Supine to Sit     Supine to sit: Mod assist, HOB elevated, Used rails          Transfers Overall transfer level: Needs assistance Equipment used: 1 person hand held assist Transfers: Sit to/from  Stand, Bed to chair/wheelchair/BSC Sit to Stand: Mod assist Stand pivot transfers: Mod assist         General transfer comment: pt pivots from bed to [facility] and later from [facility] to recliner, face to face assist from PT, PT provides facilitation of weight shift at trunk    Ambulation/Gait                  Stairs            Wheelchair Mobility     Tilt Bed    Modified Rankin (Stroke Patients Only)       Balance Overall balance assessment: Needs assistance Sitting-balance support: No upper extremity supported, Feet supported Sitting balance-Leahy Scale: Fair     Standing balance support: Bilateral upper extremity supported, Reliant on assistive device for balance Standing balance-Leahy Scale: Poor Standing balance comment: modA                             Pertinent Vitals/Pain Pain Assessment Pain Assessment: 0-10 Pain Score: 10-Worst pain ever Pain Location: back Pain Descriptors / Indicators: Aching Pain Intervention(s): Premedicated before session    Home Living Family/patient expects to be discharged to:: Private residence Living Arrangements: Non-relatives/Friends Available Help at Discharge: Friend(s);Available PRN/intermittently (friend is a Naval architect and is gone during the week) Type of Home: House Home Access: Stairs to enter Entrance Stairs-Rails: Right Entrance Stairs-Number of Steps: 5   Home Layout: One level Home Equipment: Cane - single point;Wheelchair - manual;Shower seat      Prior Function Prior Level of Function : Independent/Modified Independent  Mobility Comments: pt reports independence in mobility until the last 4-6 weeks. since she has been mobilizing for very short household distances and requiring support of her [facility] or furniture ADLs Comments: pt reports independence at baseline but has been having great difficulty with ADLs in the last 4-6 weeks. Sometimes the pt has been going multiple days  without bathing or changing clothes     Extremity/Trunk Assessment   Upper Extremity Assessment Upper Extremity Assessment: Generalized weakness    Lower Extremity Assessment Lower Extremity Assessment: Generalized weakness    Cervical / Trunk Assessment Cervical / Trunk Assessment: Kyphotic  Communication   Communication Communication: No apparent difficulties    Cognition Arousal: Alert Behavior During Therapy: WFL for tasks assessed/performed   PT - Cognitive impairments: Awareness, Safety/Judgement, Problem solving                         Following commands: Impaired Following commands impaired: Follows one step commands with increased time, Follows multi-step commands inconsistently     Cueing Cueing Techniques: Verbal cues, Visual cues, Tactile cues     General Comments General comments (skin integrity, edema, etc.): VSS on RA    Exercises     Assessment/Plan    PT Assessment Patient needs continued PT services  PT Problem List Decreased strength;Decreased activity tolerance;Decreased mobility;Decreased balance;Decreased knowledge of use of DME;Decreased cognition;Decreased safety awareness;Decreased knowledge of precautions;Pain       PT Treatment Interventions DME instruction;Gait training;Stair training;Functional mobility training;Therapeutic activities;Therapeutic exercise;Balance training;Neuromuscular re-education;Patient/family education;Wheelchair mobility training    PT Goals (Current goals can be found in the Care Plan section)  Acute Rehab PT Goals Patient Stated Goal: to return to independence PT Goal Formulation: With patient Time For Goal Achievement: 10/21/23 Potential to Achieve Goals: Fair    Frequency Min 2X/week     Co-evaluation               AM-PAC PT 6 Clicks Mobility  Outcome Measure Help needed turning from your back to your side while in a flat bed without using bedrails?: A Lot Help needed moving from  lying on your back to sitting on the side of a flat bed without using bedrails?: A Lot Help needed moving to and from a bed to a chair (including a wheelchair)?: A Lot Help needed standing up from a chair using your arms (e.g., wheelchair or bedside chair)?: A Lot Help needed to walk in hospital room?: Total Help needed climbing 3-5 steps with a railing? : Total 6 Click Score: 10    End of Session Equipment Utilized During Treatment: Gait belt Activity Tolerance: Patient limited by fatigue Patient left: in chair;with call bell/phone within reach;with chair alarm set Nurse Communication: Mobility status PT Visit Diagnosis: Other abnormalities of gait and mobility (R26.89);Muscle weakness (generalized) (M62.81)    Time: 8649-8584 PT Time Calculation (min) (ACUTE ONLY): 25 min   Charges:   PT Evaluation $PT Eval Moderate Complexity: 1 Mod   PT General Charges $$ ACUTE PT VISIT: 1 Visit         Joan Wood, PT, DPT Acute Rehabilitation Office [phone]   Joan Wood 10/07/2023, 2:30 PM

## 2023-10-07 NOTE — Progress Notes (Addendum)
 Progress Note   Patient: Joan Wood FMW:[id number] DOB: 08-16-1970 DOA: 10/03/2023     4 DOS: the patient was seen and examined on 10/07/2023   Brief hospital course: Amayrani Bennick is a 53 year old female with decompensated cirrhosis, PUD with recent gastric ulcer perforation s/p ex-lap, GERD, pancreatitis, OSA presented to [facility] with N/V/bloody diarrhea x 4 days. In the ED had coffee ground emesis. CTA GI neg for acute bleed, large ascites and new distal esophageal thickening. GI consulted for acute blood loss anemia with Hg 8.3>7. She was given PPI, started on octreotide  gtt, rocephin , vitamin K , pain and nausea control and admitted to hospitalist. GI was consulted to evaluate 9/25 AM. Hgb with drop from 8.3 >7 and 1 U PRBC ordered. GI saw patient and proceeded with upper endoscopy showing black discoloration of esophageal mucosa concerning for acute esophageal necrosis, portal hypertension, non-bleeding gastric and duodenal ulcers. Transferred to [facility] ICU for further management. She had paracentesis 10/04/23 with removed. GI following, continue PPI, octreotide , started clears, Hb remained stable transferred to [facility] service 10/06/23  Assessment and Plan: Acute upper GI bleed Acute esophageal necrosis Acute blood loss anemia Patient has h/o PUD, gastric ulcer perforation s/p sx lap 07/2023. GI follow up appreciated. Possible plan for EGD per GI.  Advanced diet to full liquids. Hb stable around 9. Trend H/H and transfuse if Hb less than 7. Continue IV PPI, octreotide . Continue antiemetics as needed.  Decompensated alcoholic liver cirrhosis Portal gastropathy Hyperbilirubinemia- Ascites- s/p paracentesis 9/25 3.3L removed. Continue rocephin  for SBP prophylaxis. Continue to monitor LFT. Avoid heparin for DVT ppx.  Coagulopathy Hypoalbuminemia Thrombocytopenia  From liver dysfunction. Coags stable. She got vitamin K . Will give albumin  infusions for 1 day.  Hyponatremia- seems  chronic. In the setting of liver failure. Na stable.  Hypomagnesemia Replete and recheck.  Tobacco abuse- Continue nicotine  patch.  Alcohol abuse- CIWA protocol. Continue thiamine , folate, MVT. Hypoglycemia protocol. Replete electrolytes as needed.    Transfer to med/ tel floor. Out of bed to chair. Incentive spirometry. Nursing supportive care. Fall, aspiration precautions. Diet:  Diet Orders (From admission, onward)     Start     Ordered   10/06/23 1515  Diet full liquid Room service appropriate? Yes; Fluid consistency: Thin  Diet effective now       Question Answer Comment  Room service appropriate? Yes   Fluid consistency: Thin      10/06/23 1515           DVT prophylaxis: SCDs Start: 10/03/23 2153  Level of care: Progressive   Code Status: Full Code  Subjective: Patient is seen and examined today morning. She is lying in bed, eating fair.did not get out of bed. RN at bedside. Does have nausea. Reports diarrhea better.  Physical Exam: Vitals:   10/07/23 1000 10/07/23 1100 10/07/23 1143 10/07/23 1200  BP: (!) 83/56 92/79  (!) 86/60  Pulse: 94 92  89  Resp: (!) 9 16  12   Temp:   98.2 F (36.8 C)   TempSrc:   Axillary   SpO2: 96% 97%  93%  Weight:      Height:        General - Elderly thin built Caucasian weak female, no apparent distress HEENT - PERRLA, EOMI, pale conjunctiva, non tender sinuses. Lung - Clear, basal rales, rhonchi, no wheezes. Heart - S1, S2 heard, no murmurs, rubs, no pedal edema. Abdomen - Soft, non tender, distended, bowel sounds good Neuro - Alert, awake and  oriented, non focal exam. Skin - Warm and dry.  Data Reviewed:      Latest Ref Rng & Units 10/07/2023    2:22 AM 10/06/2023    2:45 AM 10/05/2023    3:51 AM  CBC  WBC 4.0 - 10.5 K/uL 4.3  3.3  4.3   Hemoglobin 12.0 - 15.0 g/dL 8.8  9.2  8.7   Hematocrit 36.0 - 46.0 % 26.0  27.5  24.8   Platelets 150 - 400 K/uL 51  58  55       Latest Ref Rng & Units 10/07/2023     2:22 AM 10/06/2023    2:45 AM 10/05/2023    6:40 AM  BMP  Glucose 70 - 99 mg/dL 888  850  75   BUN 6 - 20 mg/dL <5  <5  <5   Creatinine 0.44 - 1.00 mg/dL 9.50  9.44  9.43   Sodium 135 - 145 mmol/L 125  124  123   Potassium 3.5 - 5.1 mmol/L 3.7  3.6  3.6   Chloride 98 - 111 mmol/L 95  91  91   CO2 22 - 32 mmol/L 24  26  23    Calcium  8.9 - 10.3 mg/dL 7.3  7.4  7.4    No results found.  Family Communication: Discussed with patient, she understand and agree. All questions answered.  Disposition: Status is: Inpatient Remains inpatient appropriate because: IV PPI, octreotide , hb monitoring, liver failure.  Planned Discharge Destination: Home with Home Health vs rehab     Time spent: 45 minutes  Author: Concepcion Riser, MD 10/07/2023 2:52 PM Secure chat 7am to 7pm For on call review [email or web address].

## 2023-10-08 DIAGNOSIS — Z8711 Personal history of peptic ulcer disease: Secondary | ICD-10-CM

## 2023-10-08 DIAGNOSIS — R197 Diarrhea, unspecified: Secondary | ICD-10-CM | POA: Diagnosis not present

## 2023-10-08 DIAGNOSIS — D62 Acute posthemorrhagic anemia: Secondary | ICD-10-CM

## 2023-10-08 DIAGNOSIS — K7460 Unspecified cirrhosis of liver: Secondary | ICD-10-CM

## 2023-10-08 DIAGNOSIS — K7031 Alcoholic cirrhosis of liver with ascites: Secondary | ICD-10-CM | POA: Diagnosis not present

## 2023-10-08 DIAGNOSIS — K209 Esophagitis, unspecified without bleeding: Secondary | ICD-10-CM | POA: Diagnosis not present

## 2023-10-08 DIAGNOSIS — R112 Nausea with vomiting, unspecified: Secondary | ICD-10-CM | POA: Diagnosis not present

## 2023-10-08 DIAGNOSIS — K922 Gastrointestinal hemorrhage, unspecified: Secondary | ICD-10-CM | POA: Diagnosis not present

## 2023-10-08 DIAGNOSIS — K253 Acute gastric ulcer without hemorrhage or perforation: Secondary | ICD-10-CM | POA: Diagnosis not present

## 2023-10-08 DIAGNOSIS — K2289 Other specified disease of esophagus: Secondary | ICD-10-CM | POA: Diagnosis not present

## 2023-10-08 LAB — RENAL FUNCTION PANEL
Albumin: 2.2 g/dL — ABNORMAL LOW (ref 3.5–5.0)
Anion gap: 6 (ref 5–15)
BUN: <5 mg/dL — ABNORMAL LOW (ref 6–20)
CO2: 23 mmol/L (ref 22–32)
Calcium: 7.6 mg/dL — ABNORMAL LOW (ref 8.9–10.3)
Chloride: 96 mmol/L — ABNORMAL LOW (ref 98–111)
Creatinine, Ser: 0.53 mg/dL (ref 0.44–1.00)
GFR, Estimated: >60 mL/min (ref >60)
Glucose, Bld: 78 mg/dL (ref 70–99)
Phosphorus: 2.5 mg/dL (ref 2.5–4.6)
Potassium: 4.1 mmol/L (ref 3.5–5.1)
Sodium: 125 mmol/L — ABNORMAL LOW (ref 135–145)

## 2023-10-08 LAB — CYTOLOGY - NON PAP

## 2023-10-08 LAB — AFP TUMOR MARKER: AFP, Serum, Tumor Marker: 2.7 ng/mL (ref 0.0–9.2)

## 2023-10-08 LAB — CBC
HCT: 23.4 % — ABNORMAL LOW (ref 36.0–46.0)
Hemoglobin: 7.9 g/dL — ABNORMAL LOW (ref 12.0–15.0)
MCH: 32.2 pg (ref 26.0–34.0)
MCHC: 33.8 g/dL (ref 30.0–36.0)
MCV: 95.5 fL (ref 80.0–100.0)
Platelets: 44 K/uL — ABNORMAL LOW (ref 150–400)
RBC: 2.45 MIL/uL — ABNORMAL LOW (ref 3.87–5.11)
RDW: 24.4 % — ABNORMAL HIGH (ref 11.5–15.5)
WBC: 3.3 K/uL — ABNORMAL LOW (ref 4.0–10.5)
nRBC: 0 % (ref 0.0–0.2)

## 2023-10-08 LAB — BODY FLUID CULTURE W GRAM STAIN: Culture: NO GROWTH

## 2023-10-08 LAB — GASTRIN: Gastrin: 39 pg/mL (ref 0–115)

## 2023-10-08 LAB — GLUCOSE, CAPILLARY
Glucose-Capillary: 86 mg/dL (ref 70–99)
Glucose-Capillary: 115 mg/dL — ABNORMAL HIGH (ref 70–99)
Glucose-Capillary: 116 mg/dL — ABNORMAL HIGH (ref 70–99)
Glucose-Capillary: 124 mg/dL — ABNORMAL HIGH (ref 70–99)

## 2023-10-08 LAB — MAGNESIUM: Magnesium: 1.6 mg/dL — ABNORMAL LOW (ref 1.7–2.4)

## 2023-10-08 MED ORDER — MAGNESIUM SULFATE 4 GM/100ML IV SOLN
4.0000 g | Freq: Once | INTRAVENOUS | Status: AC
  Administered 2023-10-08: 4 g via INTRAVENOUS
  Filled 2023-10-08: qty 100

## 2023-10-08 MED ORDER — DIPHENHYDRAMINE HCL 50 MG/ML IJ SOLN
25.0000 mg | Freq: Once | INTRAMUSCULAR | Status: AC
  Administered 2023-10-08: 25 mg via INTRAVENOUS
  Filled 2023-10-08: qty 1

## 2023-10-08 MED ORDER — PANTOPRAZOLE SODIUM 40 MG PO TBEC
40.0000 mg | DELAYED_RELEASE_TABLET | Freq: Two times a day (BID) | ORAL | Status: AC
  Administered 2023-10-08 – 2024-02-15 (×261): 40 mg via ORAL
  Filled 2023-10-08 (×23): qty 1
  Filled 2023-10-08: qty 2
  Filled 2023-10-08 (×85): qty 1
  Filled 2023-10-08: qty 2
  Filled 2023-10-08 (×74): qty 1
  Filled 2023-10-08: qty 2
  Filled 2023-10-08 (×62): qty 1

## 2023-10-08 MED ORDER — SUCRALFATE 1 GM/10ML PO SUSP
1.0000 g | Freq: Four times a day (QID) | ORAL | Status: DC
  Administered 2023-10-08 – 2023-11-13 (×137): 1 g via ORAL
  Filled 2023-10-08 (×145): qty 10

## 2023-10-08 NOTE — Progress Notes (Addendum)
 Daily Progress Note  DOA: 10/03/2023 Hospital Day: 6  Cc:  upper GI bleed, concern for esophageal necrosis  ASSESSMENT    53 yo female transferred from [facility] with upper GI bleed , acute on chronic anemia in setting of GI bleed.  EGD concerning for acute esophageal necrosis along with Grade D esophagitis, portal hypertensive gastropathy, nonbleeding gastric ulcer with clean base and non-bleeding duodenal ulcers with clean base. Of note no SBP on 9/25 LVP TODAY:  Hgb stable at 7.9 post 1 u RBCs on 9/25.    History of perforated gastric ulcer s/p repair with omental patch July 2025  Decompensated Etoh cirrhosis with ascites, coagulopathy  MELD 3.0: 29 at 10/08/2023  4:48 AM   Principal Problem:   GI bleed Active Problems:   Alcohol use disorder   Hyponatremia   Tobacco abuse   Nausea vomiting and diarrhea   Acute on chronic blood loss anemia   Liver cirrhosis (HCC)   Esophageal necrosis   PLAN   -Continue Pantoprazole . Dose was changed earlier today from Q 12 BID to PO daily. She has severe esophagitis / PUD. Will increased to BID  --Thought she was suppose to be getting Carafate  suspension but do not see on med list?  Will order it --Still getting sandostatin  12.5 ml / hr ( on day 6).  Still indicated?  --Getting Albumin  25 grams Q 8, still needed?  --On day 6 of Rocephin  --Continue Ensure --Gastrin level pending   Subjective   Painful swallowing. No BMs / GI bleeding today. No vomiting today . Complains of upper abdominal pain ( generalized) which she gets every few weeks until she comes into the hospital and gets fixed up   Objective   GI Studies:   EGD  - LA Grade D (one or more mucosal breaks involving at least 75% of esophageal circumference) esophagitis with no bleeding. - Black discoloration mucosa in the esophagus concerning for acute esophageal necrosis ( AEN). - Portal hypertensive gastropathy. - Non-bleeding gastric ulcer with a clean ulcer  base (Forrest Class III) which apparent sutures. - Non-bleeding duodenal ulcers with a clean ulcer base (Forrest Class III). - No specimens collected. -No evidence of active bleeding or varices seen throughout the exam  HCC screening:  09/29/23 CT AP with contrast -  A few stable scattered hypodensities are noted in the liver. AFP normal  Recent Labs    10/06/23 0245 10/07/23 0222 10/08/23 0448  WBC 3.3* 4.3 3.3*  HGB 9.2* 8.8* 7.9*  HCT 27.5* 26.0* 23.4*  MCV 92.3 93.5 95.5  PLT 58* 51* 44*   No results for input(s): FOLATE, VITAMINB12, FERRITIN, TIBC, IRONPCTSAT in the last 72 hours. Recent Labs    10/06/23 0245 10/07/23 0222 10/08/23 0448  NA 124* 125* 125*  K 3.6 3.7 4.1  CL 91* 95* 96*  CO2 26 24 23   GLUCOSE 149* 111* 78  BUN <5* <5* <5*  CREATININE 0.55 0.49 0.53  CALCIUM  7.4* 7.3* 7.6*   Recent Labs    10/06/23 2045 10/07/23 0222 10/08/23 0448  PROT 4.5*  --   --   ALBUMIN  <1.5* <1.5* 2.2*  AST 70*  --   --   ALT 24  --   --   ALKPHOS 45  --   --   BILITOT 3.8*  --   --   BILIDIR 1.9*  --   --   IBILI 1.9*  --   --  Imaging:  CT ANGIO GI BLEED CLINICAL DATA:  GIB, h/o perforated gastric ulcer. Nausea, vomiting.  EXAM: CTA ABDOMEN AND PELVIS WITHOUT AND WITH CONTRAST  TECHNIQUE: Multidetector CT imaging of the abdomen and pelvis was performed using the standard protocol during bolus administration of intravenous contrast. Multiplanar reconstructed images and MIPs were obtained and reviewed to evaluate the vascular anatomy.  RADIATION DOSE REDUCTION: This exam was performed according to the departmental dose-optimization program which includes automated exposure control, adjustment of the mA and/or kV according to patient size and/or use of iterative reconstruction technique.  CONTRAST:  OMNIPAQUE  IOHEXOL  350 MG/ML SOLN  COMPARISON:  09/29/2023  FINDINGS: VASCULAR  Aorta: Normal caliber aorta without aneurysm,  dissection, vasculitis or significant stenosis. Aortic atherosclerosis.  Celiac: Patent  SMA: Patent  Renals: Patent  IMA: Patent  Inflow: Patent without evidence of aneurysm, dissection, vasculitis or significant stenosis.  Proximal Outflow: Bilateral common femoral and visualized portions of the superficial and profunda femoral arteries are patent without evidence of aneurysm, dissection, vasculitis or significant stenosis.  Veins: No obvious venous abnormality within the limitations of this arterial phase study.  Review of the MIP images confirms the above findings.  NON-VASCULAR  Lower chest: Distal esophageal wall thickening, new since prior study. No effusions.  Hepatobiliary: Severe diffuse fatty infiltration. Changes of cirrhosis, stable. Gallbladder grossly unremarkable.  Pancreas: No focal abnormality or ductal dilatation.  Spleen: No focal abnormality.  Normal size.  Adrenals/Urinary Tract: No adrenal abnormality. No focal renal abnormality. No stones or hydronephrosis. Urinary bladder is unremarkable.  Stomach/Bowel: Stomach and small bowel decompressed. Large bowel unremarkable. No contrast extravasation to localize GI bleed. No bowel obstruction or inflammatory process.  Lymphatic: No adenopathy  Reproductive: Uterus and adnexa unremarkable.  No mass.  Other: Large volume ascites, stable.  Musculoskeletal: No acute bony abnormality.  IMPRESSION: VASCULAR  Aortic atherosclerosis.  No active extravasation of contrast to localize GI bleed.  NON-VASCULAR  Changes of cirrhosis and hepatic steatosis. Stable large volume ascites.  Distal esophageal wall thickening, new since prior study. This could reflect esophagitis.  Electronically Signed   By: Franky Crease M.D.   On: 10/03/2023 19:34     Scheduled inpatient medications:   calcium  carbonate  1 tablet Oral TID WC   feeding supplement  237 mL Oral BID BM   folic acid   1 mg Oral Daily    lidocaine   2 patch Transdermal Q24H   multivitamin with minerals  1 tablet Oral Daily   nicotine   21 mg Transdermal Daily   pantoprazole   40 mg Oral Daily   thiamine   100 mg Oral Daily   Continuous inpatient infusions:   albumin  human 60 mL/hr at 10/08/23 0600   octreotide  (SANDOSTATIN ) 500 mcg in sodium chloride  0.9 % 250 mL (2 mcg/mL) infusion 25 mcg/hr (10/08/23 1249)   promethazine  (PHENERGAN ) injection (IM or IVPB) Stopped (10/07/23 1811)   PRN inpatient medications: albuterol , alum & mag hydroxide-simeth, mouth rinse, oxyCODONE , promethazine  (PHENERGAN ) injection (IM or IVPB)  Vital signs in last 24 hours: Temp:  [97.2 F (36.2 C)-99.7 F (37.6 C)] 99.7 F (37.6 C) (09/29 1103) Pulse Rate:  [85-102] 98 (09/29 1103) Resp:  [9-20] 16 (09/29 0506) BP: (83-108)/(57-68) 102/63 (09/29 1103) SpO2:  [96 %-98 %] 97 % (09/29 1103) Weight:  [61.7 kg] 61.7 kg (09/29 0500) Last BM Date : 10/06/23  Intake/Output Summary (Last 24 hours) at 10/08/2023 1420 Last data filed at 10/08/2023 0600 Gross per 24 hour  Intake 834.48 ml  Output  160 ml  Net 674.48 ml    Intake/Output from previous day: 09/28 0701 - 09/29 0700 In: 1102.4 [P.O.:360; I.V.:272.2; IV Piggyback:470.3] Out: 270 [Urine:270] Intake/Output this shift: No intake/output data recorded.   Physical Exam:  General: Alert female in NAD Heart:  Regular rate and rhythm.  Pulmonary: Normal respiratory effort Abdomen: Soft, nondistended, generalized upper abdominal tenderness .  Normal bowel sounds. Extremities: No lower extremity edema  Neurologic: Alert and oriented Psych: Pleasant. Cooperative     LOS: 5 days   Vina Dasen ,NP 10/08/2023, 2:20 PM   Attending physician's note   I have taken a history, reviewed the chart, and examined the patient. I performed a substantive portion of this encounter, including complete performance of at least one of the key components, in conjunction with the APP. I agree with the  APP's note, impression, and recommendations with my edits.  53 year old female with EtOH cirrhosis transferred from [facility] due to acute esophageal necrosis noted on EGD.  Continues to have odynophagia, but slowly improving.  H/H largely stable 7.9/23.  Paracentesis on 9/25 without SBP.  1) Acute esophageal necrosis Discussed diagnosis of acute esophageal necrosis with patient at length today.  Diagnosis does carry high risk of morbidity and mortality.  Typically related to underlying systemic illness (i.e. infection, shock, etc.), although acute EtOH intoxication has been a described culprit.  Clinically appears to be slowly improving.  - Started on Carafate  1 g 4 times daily - Increased PPI to Protonix  40 mg twice daily.  If not tolerating tablets, can convert to IV for the time being - Resume liquids for now - If continued odynophagia, can also add viscous lidocaine  - Relook endoscopy not typically needed for acute esophageal necrosis unless not clinically improving  2) Decompensated EtOH cirrhosis 3) Ascites 4) Coagulopathy 5) Thrombocytopenia - MELD 29 - Paracentesis on 9/25 negative for SBP - Trend daily CBC and CMP while in house - ?  Does patient still require octreotide  and albumin  - Unless other need for antibiotics, can likely DC Rocephin  tomorrow - CIWA protocol  6) History of perforated gastric ulcer Required omental patch in 07/2023 - Trending labs as above with high-dose PPI and Carafate .  Reviewed EGD images from 09/2023 which showed clean-based antral ulcer measuring 20 x 20 mm and surgical suture in place   [employer], DO, FACG [phone] office

## 2023-10-08 NOTE — Plan of Care (Signed)

## 2023-10-08 NOTE — TOC Progression Note (Signed)
 Transition of Care [facility]) - Progression Note    Patient Details  Name: Joan Wood MRN: [id number] Date of Birth: [birth year]  [facility] of [facility]) CM/SW Contact  [name], [name] Phone Number: 10/08/2023, 2:22 PM  Clinical Narrative:   CSW met with patient to discuss recommendation for SNF. Patient not interested in SNF, requesting HH if possible. Patient said she's had home health before, but no home health history is listed in [facility]. Patient asking for RW, order sent to [facility]. She already has a cane, wheelchair, and bath seat at home. She uses [facility] [place] for [facility], and utilizes her Medicaid transportation for appointments. CSW to attempt to locate home health service.    Expected Discharge Plan: Home w Home Health Services Barriers to Discharge: Continued Medical Work up               Expected Discharge Plan and Services In-house Referral: Clinical Social Work Discharge Planning Services: CM Consult Post Acute Care Choice: Home Health Living arrangements for the past 2 months: Single Family Home                 DME Arranged: Walker rolling   Date DME Agency Contacted: 10/08/23   Representative spoke with at DME Agency: [name]             Social Drivers of Health (SDOH) Interventions SDOH Screenings   Food Insecurity: No Food Insecurity (10/03/2023)  Housing: Low Risk  (10/03/2023)  Transportation Needs: No Transportation Needs (10/03/2023)  Recent Concern: Transportation Needs - Unmet Transportation Needs (08/20/2023)  Utilities: Not At Risk (10/03/2023)  Depression (PHQ2-9): High Risk (07/30/2023)  Social Connections: Socially Isolated (10/03/2023)  Tobacco Use: High Risk (10/04/2023)    Readmission Risk Interventions    10/04/2023    9:02 AM 09/03/2023   12:31 PM 09/02/2023    9:01 AM  Readmission Risk Prevention Plan  Transportation Screening Complete Complete Complete  PCP or Specialist Appt within 3-5 Days   Complete  HRI or  Home Care Consult   Complete  Social Work Consult for Recovery Care Planning/Counseling   Complete  Palliative Care Screening   Not Applicable  Medication Review Oceanographer) Complete Complete Complete  HRI or Home Care Consult Complete Complete   SW Recovery Care/Counseling Consult Complete Complete   Palliative Care Screening Not Applicable Not Applicable   Skilled Nursing Facility Not Applicable Patient Refused

## 2023-10-08 NOTE — Progress Notes (Signed)
 Progress Note   Patient: Joan Wood FMW:[id number] DOB: Oct 11, 1970 DOA: 10/03/2023     5 DOS: the patient was seen and examined on 10/08/2023   Brief hospital course: Jeanie Mccard is a 53 year old female with decompensated cirrhosis, PUD with recent gastric ulcer perforation s/p ex-lap, GERD, pancreatitis, OSA presented to [facility] with N/V/bloody diarrhea x 4 days. In the ED had coffee ground emesis. CTA GI neg for acute bleed, large ascites and new distal esophageal thickening. GI consulted for acute blood loss anemia with Hg 8.3>7. She was given PPI, started on octreotide  gtt, rocephin , vitamin K , pain and nausea control and admitted to hospitalist. GI was consulted to evaluate 9/25 AM. Hgb with drop from 8.3 >7 and 1 U PRBC ordered. GI saw patient and proceeded with upper endoscopy showing black discoloration of esophageal mucosa concerning for acute esophageal necrosis, portal hypertension, non-bleeding gastric and duodenal ulcers. Transferred to [facility] ICU for further management. She had paracentesis 10/04/23 with removed. GI following, continue PPI, octreotide , started clears, Hb remained stable transferred to [facility] service 10/06/23  Assessment and Plan: Acute upper GI bleed Acute esophageal necrosis Acute blood loss anemia Patient has h/o PUD, gastric ulcer perforation s/p sx lap 07/2023. GI follow up appreciated. Possible plan for EGD per GI.  Advanced diet to full liquids. Hb stable around 8. Trend H/H and transfuse if Hb less than 7. Continue IV PPI, octreotide . Continue antiemetics as needed.  Decompensated alcoholic liver cirrhosis Portal gastropathy Hyperbilirubinemia- Ascites- s/p paracentesis 9/25 3.3L removed. Rocephin  for SBP prophylaxis for 5 days. Continue PPI, octreotide . Continue to monitor LFT.  Coagulopathy Hypoalbuminemia Thrombocytopenia  From liver dysfunction. Coags stable. She got vitamin K . Got albumin  infusions for 1 day. Avoid heparin for DVT ppx. SCD for  DVT ppx.  Hyponatremia- seems chronic. In the setting of liver failure. Na stable.  Hypomagnesemia- IV mag replacement ordered. Monitor daily electrolytes.  Tobacco abuse- Continue nicotine  patch.  Alcohol abuse- CIWA protocol. Continue thiamine , folate, MVT. Hypoglycemia protocol. Replete electrolytes as per protocol.    Transfer to med/ tel floor. Out of bed to chair. Incentive spirometry. Nursing supportive care. Fall, aspiration precautions. Diet:  Diet Orders (From admission, onward)     Start     Ordered   10/06/23 1515  Diet full liquid Room service appropriate? Yes; Fluid consistency: Thin  Diet effective now       Question Answer Comment  Room service appropriate? Yes   Fluid consistency: Thin      10/06/23 1515           DVT prophylaxis: SCDs Start: 10/03/23 2153  Level of care: Progressive   Code Status: Full Code  Subjective: Patient is seen and examined today morning. She is lying in bed, feels weak. Has abdominal discomfort. Did not get out of bed. Eating poor.  Physical Exam: Vitals:   10/08/23 0500 10/08/23 0506 10/08/23 0751 10/08/23 1103  BP:  (!) 93/59 108/64 102/63  Pulse:  94 (!) 102 98  Resp:  16    Temp:  98.6 F (37 C) 98 F (36.7 C) 99.7 F (37.6 C)  TempSrc:  Oral Oral Oral  SpO2:  96% 97% 97%  Weight: 61.7 kg     Height:        General - Elderly thin built Caucasian weak female, no apparent distress HEENT - PERRLA, EOMI, pale conjunctiva, non tender sinuses. Lung - Clear, basal rales, rhonchi, no wheezes. Heart - S1, S2 heard, no murmurs, rubs, no  pedal edema. Abdomen - Soft, left lower abdomen tender, distended, bowel sounds good Neuro - Alert, awake and oriented, non focal exam. Skin - Warm and dry.  Data Reviewed:      Latest Ref Rng & Units 10/08/2023    4:48 AM 10/07/2023    2:22 AM 10/06/2023    2:45 AM  CBC  WBC 4.0 - 10.5 K/uL 3.3  4.3  3.3   Hemoglobin 12.0 - 15.0 g/dL 7.9  8.8  9.2   Hematocrit 36.0 -  46.0 % 23.4  26.0  27.5   Platelets 150 - 400 K/uL 44  51  58       Latest Ref Rng & Units 10/08/2023    4:48 AM 10/07/2023    2:22 AM 10/06/2023    2:45 AM  BMP  Glucose 70 - 99 mg/dL 78  888  850   BUN 6 - 20 mg/dL <5  <5  <5   Creatinine 0.44 - 1.00 mg/dL 9.46  9.50  9.44   Sodium 135 - 145 mmol/L 125  125  124   Potassium 3.5 - 5.1 mmol/L 4.1  3.7  3.6   Chloride 98 - 111 mmol/L 96  95  91   CO2 22 - 32 mmol/L 23  24  26    Calcium  8.9 - 10.3 mg/dL 7.6  7.3  7.4    No results found.  Family Communication: Discussed with patient, she understand and agree. All questions answered.  Disposition: Status is: Inpatient Remains inpatient appropriate because: IV PPI, octreotide , hb monitoring, liver failure.  Planned Discharge Destination: Home with Home Health vs rehab     Time spent: 46 minutes  Author: Concepcion Riser, MD 10/08/2023 2:15 PM Secure chat 7am to 7pm For on call review [email or web address].

## 2023-10-09 DIAGNOSIS — R188 Other ascites: Secondary | ICD-10-CM | POA: Diagnosis not present

## 2023-10-09 DIAGNOSIS — K253 Acute gastric ulcer without hemorrhage or perforation: Secondary | ICD-10-CM | POA: Diagnosis not present

## 2023-10-09 DIAGNOSIS — D62 Acute posthemorrhagic anemia: Secondary | ICD-10-CM | POA: Diagnosis not present

## 2023-10-09 DIAGNOSIS — K7290 Hepatic failure, unspecified without coma: Secondary | ICD-10-CM | POA: Diagnosis not present

## 2023-10-09 DIAGNOSIS — K7460 Unspecified cirrhosis of liver: Secondary | ICD-10-CM

## 2023-10-09 DIAGNOSIS — R197 Diarrhea, unspecified: Secondary | ICD-10-CM | POA: Diagnosis not present

## 2023-10-09 DIAGNOSIS — R112 Nausea with vomiting, unspecified: Secondary | ICD-10-CM | POA: Diagnosis not present

## 2023-10-09 DIAGNOSIS — F1090 Alcohol use, unspecified, uncomplicated: Secondary | ICD-10-CM | POA: Diagnosis not present

## 2023-10-09 DIAGNOSIS — E871 Hypo-osmolality and hyponatremia: Secondary | ICD-10-CM | POA: Diagnosis not present

## 2023-10-09 DIAGNOSIS — K922 Gastrointestinal hemorrhage, unspecified: Secondary | ICD-10-CM | POA: Diagnosis not present

## 2023-10-09 DIAGNOSIS — K209 Esophagitis, unspecified without bleeding: Secondary | ICD-10-CM | POA: Diagnosis not present

## 2023-10-09 DIAGNOSIS — K2289 Other specified disease of esophagus: Secondary | ICD-10-CM | POA: Diagnosis not present

## 2023-10-09 DIAGNOSIS — K7031 Alcoholic cirrhosis of liver with ascites: Secondary | ICD-10-CM | POA: Diagnosis not present

## 2023-10-09 LAB — GLUCOSE, CAPILLARY
Glucose-Capillary: 101 mg/dL — ABNORMAL HIGH (ref 70–99)
Glucose-Capillary: 104 mg/dL — ABNORMAL HIGH (ref 70–99)
Glucose-Capillary: 111 mg/dL — ABNORMAL HIGH (ref 70–99)
Glucose-Capillary: 114 mg/dL — ABNORMAL HIGH (ref 70–99)
Glucose-Capillary: 130 mg/dL — ABNORMAL HIGH (ref 70–99)
Glucose-Capillary: 131 mg/dL — ABNORMAL HIGH (ref 70–99)
Glucose-Capillary: 139 mg/dL — ABNORMAL HIGH (ref 70–99)

## 2023-10-09 MED ORDER — MUSCLE RUB 10-15 % EX CREA
TOPICAL_CREAM | CUTANEOUS | Status: AC | PRN
  Administered 2023-12-13 – 2024-01-16 (×3): 1 via TOPICAL
  Filled 2023-10-09 (×3): qty 85

## 2023-10-09 MED ORDER — FUROSEMIDE 10 MG/ML IJ SOLN
20.0000 mg | Freq: Once | INTRAMUSCULAR | Status: AC
  Administered 2023-10-09: 20 mg via INTRAVENOUS
  Filled 2023-10-09: qty 4

## 2023-10-09 MED ORDER — SIMETHICONE 80 MG PO CHEW
80.0000 mg | CHEWABLE_TABLET | Freq: Four times a day (QID) | ORAL | Status: AC | PRN
  Administered 2023-10-11 – 2024-02-15 (×26): 80 mg via ORAL
  Filled 2023-10-09 (×25): qty 1

## 2023-10-09 MED ORDER — DIPHENHYDRAMINE HCL 50 MG/ML IJ SOLN
25.0000 mg | Freq: Three times a day (TID) | INTRAMUSCULAR | Status: DC | PRN
  Administered 2023-10-09 – 2023-10-10 (×2): 25 mg via INTRAVENOUS
  Filled 2023-10-09 (×2): qty 1

## 2023-10-09 MED ORDER — PHENOL 1.4 % MT LIQD
1.0000 | OROMUCOSAL | Status: AC | PRN
  Administered 2023-10-09: 1 via OROMUCOSAL
  Filled 2023-10-09: qty 177

## 2023-10-09 MED ORDER — ALBUMIN HUMAN 25 % IV SOLN
12.5000 g | Freq: Three times a day (TID) | INTRAVENOUS | Status: AC
  Administered 2023-10-09 – 2023-10-10 (×3): 12.5 g via INTRAVENOUS
  Filled 2023-10-09 (×3): qty 50

## 2023-10-09 NOTE — Progress Notes (Signed)
 Progress Note   Patient: Joan Wood FMW:[id number] DOB: [birth year] DOA: 10/03/2023     6 DOS: the patient was seen and examined on 10/09/2023   Brief hospital course: Joan Wood is a 53 year old female with decompensated cirrhosis, PUD with recent gastric ulcer perforation s/p ex-lap, GERD, pancreatitis, OSA presented to [facility] with N/V/bloody diarrhea x 4 days. In the ED had coffee ground emesis. CTA GI neg for acute bleed, large ascites and new distal esophageal thickening. GI consulted for acute blood loss anemia with Hg 8.3>7. She was given PPI, started on octreotide  gtt, rocephin , vitamin K , pain and nausea control and admitted to hospitalist. GI was consulted to evaluate 9/25 AM. Hgb with drop from 8.3 >7 and 1 U PRBC ordered. GI saw patient and proceeded with upper endoscopy showing black discoloration of esophageal mucosa concerning for acute esophageal necrosis, portal hypertension, non-bleeding gastric and duodenal ulcers. Transferred to [facility] ICU for further management. She had paracentesis 10/04/23 with removed. GI following, continue PPI, octreotide , started clears, Hb remained stable transferred to [facility] service 10/06/23  Assessment and Plan: Acute upper GI bleed Acute esophageal necrosis Acute blood loss anemia Patient has h/o PUD, gastric ulcer perforation s/p sx lap 07/2023. GI follow up appreciated. Possible no plan for EGD per GI unless not improving. Advanced diet to soft today. Hb stable around 8. Trend H/H and transfuse if Hb less than 7. IV PPI changed to oral 40 bid. Stopped octreotide . Continue antiemetics as needed.  Decompensated alcoholic liver cirrhosis Portal gastropathy Hyperbilirubinemia- Ascites- s/p paracentesis 9/25 3.3L removed. Rocephin  for SBP prophylaxis for 5 days. Continue PPI, continue to monitor LFT.  Coagulopathy Hypoalbuminemia Thrombocytopenia  From liver dysfunction. Coags stable. She got vitamin K . Will give a day of albumin  infusions,  Lasix 20mg  once. Avoid heparin for DVT ppx. SCD for DVT ppx.  Hyponatremia- seems chronic. In the setting of liver failure. Na stable.  Hypomagnesemia- Got IV replacements. Monitor daily electrolytes.  Tobacco abuse- Continue nicotine  patch.  Alcohol abuse- CIWA protocol. Continue thiamine , folate, MVT. Hypoglycemia protocol. Replete electrolytes as per protocol.   PT/ OT evaluation for discharge planning. Out of bed to chair. Incentive spirometry. Nursing supportive care. Fall, aspiration precautions. Diet:  Diet Orders (From admission, onward)     Start     Ordered   10/09/23 1327  DIET SOFT Room service appropriate? Yes; Fluid consistency: Thin  Diet effective now       Question Answer Comment  Room service appropriate? Yes   Fluid consistency: Thin      10/09/23 1326           DVT prophylaxis: SCDs Start: 10/03/23 2153  Level of care: Progressive   Code Status: Full Code  Subjective: Patient is seen and examined today morning. She is complaining of thigh swelling, pain and itching. Has abdominal discomfort. Encouraged out of bed, asks for soft diet.  Physical Exam: Vitals:   10/09/23 0433 10/09/23 0500 10/09/23 0818 10/09/23 1151  BP: (!) 92/58  (!) 90/58 (!) 90/54  Pulse: 94  91 96  Resp: 19  20 18   Temp: 97.6 F (36.4 C)  98.9 F (37.2 C) 99.4 F (37.4 C)  TempSrc: Oral  Oral Oral  SpO2: 97%  97% 100%  Weight:  41.9 kg    Height:        General - Elderly thin built Caucasian weak female, distress due to pain, itching. HEENT - PERRLA, EOMI, pale conjunctiva, non tender sinuses. Lung - Clear,  basal rales, rhonchi, no wheezes. Heart - S1, S2 heard, no murmurs, rubs, b/l dependant thigh edema noted. Abdomen - Soft, left lower abdomen tender, distended, bowel sounds good Neuro - Alert, awake and oriented, non focal exam. Skin - Warm and dry.  Data Reviewed:      Latest Ref Rng & Units 10/08/2023    4:48 AM 10/07/2023    2:22 AM 10/06/2023     2:45 AM  CBC  WBC 4.0 - 10.5 K/uL 3.3  4.3  3.3   Hemoglobin 12.0 - 15.0 g/dL 7.9  8.8  9.2   Hematocrit 36.0 - 46.0 % 23.4  26.0  27.5   Platelets 150 - 400 K/uL 44  51  58       Latest Ref Rng & Units 10/08/2023    4:48 AM 10/07/2023    2:22 AM 10/06/2023    2:45 AM  BMP  Glucose 70 - 99 mg/dL 78  888  850   BUN 6 - 20 mg/dL <5  <5  <5   Creatinine 0.44 - 1.00 mg/dL 9.46  9.50  9.44   Sodium 135 - 145 mmol/L 125  125  124   Potassium 3.5 - 5.1 mmol/L 4.1  3.7  3.6   Chloride 98 - 111 mmol/L 96  95  91   CO2 22 - 32 mmol/L 23  24  26    Calcium  8.9 - 10.3 mg/dL 7.6  7.3  7.4    No results found.  Family Communication: Discussed with patient, she understand and agree. All questions answered.  Disposition: Status is: Inpatient Remains inpatient appropriate because:hb monitoring, advance diet, PT/ OT.  Planned Discharge Destination: Home with Home Health vs rehab     Time spent: 44 minutes  Author: Concepcion Riser, MD 10/09/2023 1:27 PM Secure chat 7am to 7pm For on call review [email or web address].

## 2023-10-09 NOTE — Plan of Care (Signed)
  Problem: Health Behavior/Discharge Planning: Goal: Ability to manage health-related needs will improve Outcome: Progressing   Problem: Clinical Measurements: Goal: Will remain free from infection Outcome: Progressing   Problem: Activity: Goal: Risk for activity intolerance will decrease Outcome: Progressing   Problem: Nutrition: Goal: Adequate nutrition will be maintained Outcome: Progressing   Problem: Coping: Goal: Level of anxiety will decrease Outcome: Progressing   Problem: Elimination: Goal: Will not experience complications related to urinary retention Outcome: Progressing   Problem: Pain Managment: Goal: General experience of comfort will improve and/or be controlled Outcome: Progressing   Problem: Safety: Goal: Ability to remain free from injury will improve Outcome: Progressing   Problem: Skin Integrity: Goal: Risk for impaired skin integrity will decrease Outcome: Progressing

## 2023-10-09 NOTE — Plan of Care (Signed)
  Problem: Education: Goal: Knowledge of General Education information will improve Description: Including pain rating scale, medication(s)/side effects and non-pharmacologic comfort measures Outcome: Progressing   Problem: Clinical Measurements: Goal: Ability to maintain clinical measurements within normal limits will improve Outcome: Progressing   Problem: Clinical Measurements: Goal: Diagnostic test results will improve Outcome: Progressing   

## 2023-10-09 NOTE — Progress Notes (Addendum)
 Daily Progress Note  DOA: 10/03/2023 Hospital Day: 7  Cc:  Upper GI bleed, concern for esophageal necrosis   ASSESSMENT    53 yo female with a PUD, perforated gastric ulcer s/p repair with omental patch July 2025. Transferred from [facility] with UGI bleed and EGD concerning for acute esophageal necrosis along with Grade D esophagitis, portal hypertensive gastropathy, nonbleeding gastric and duodenal ulcers Gastric biopsies in May 2025 negative for H.pylori. Denies NSAID use. Gastrin level is normal.  TODAY:  Hgb down slighlty overnight from 8.8 to 7.9 but no overt GI bleeding. Tolerating full liquid diet. Less pain since starting Carafate  yesterday.   Decompensated Etoh cirrhosis with ascites, coagulopathy  No gastric varices on EGD.  No esophageal biopsies either  (assuming they would be evident in the setting of such severe esophagitis and suspected esophageal necrosis ) MELD 3.0: 29 at 10/08/2023  4:48 AM SBP on 9/25 LVP   Principal Problem:   GI bleed Active Problems:   Alcohol use disorder   Hyponatremia   Tobacco abuse   Nausea vomiting and diarrhea   Acute on chronic blood loss anemia   Liver cirrhosis (HCC)   Esophageal necrosis   Decompensated cirrhosis (HCC)   ABLA (acute blood loss anemia)   PLAN   --Diet has been advanced to soft. Will modify to make 2 gram Na restricted --Continue Carafate  suspension ac and HS x 14 days ( on day 2)  --Continue BID Pantoprazole  - until has follow up with her primary GI at [facility] --Continue Ensure 2-3 times daily --If not already done will D/C octreotide , D/C Rocephin  --GI will sign off.  Patient needs to follow-up with [facility] upon hospital discharge.  It seems cirrhosis diagnosis is new since her last visit at their office and given the presence of ascites / lower extremity edema they would likely start her on diuretics at some point.  --Home on 2 gram sodium restricted diet.  --Consider initiation of  low-dose diuretics upon discharge assuming she can get quick follow-up with her outpatient GI -- She is requesting another therapeutic paracentesis which is reasonable to do prior to hospital discharge as long as her renal function remains normal .    Subjective   Last pain with swallowing since starting Carafate  yesterday.  She would like more fluid removed from her belly.  She would like to advance to a soft diet.    Objective    Recent Labs    10/07/23 0222 10/08/23 0448  WBC 4.3 3.3*  HGB 8.8* 7.9*  HCT 26.0* 23.4*  MCV 93.5 95.5  PLT 51* 44*   No results for input(s): FOLATE, VITAMINB12, FERRITIN, TIBC, IRONPCTSAT in the last 72 hours. Recent Labs    10/07/23 0222 10/08/23 0448  NA 125* 125*  K 3.7 4.1  CL 95* 96*  CO2 24 23  GLUCOSE 111* 78  BUN <5* <5*  CREATININE 0.49 0.53  CALCIUM  7.3* 7.6*   Recent Labs    10/06/23 2045 10/07/23 0222 10/08/23 0448  PROT 4.5*  --   --   ALBUMIN  <1.5* <1.5* 2.2*  AST 70*  --   --   ALT 24  --   --   ALKPHOS 45  --   --   BILITOT 3.8*  --   --   BILIDIR 1.9*  --   --   IBILI 1.9*  --   --     Imaging:  CT ANGIO GI BLEED CLINICAL DATA:  GIB, h/o perforated gastric ulcer. Nausea, vomiting.  EXAM: CTA ABDOMEN AND PELVIS WITHOUT AND WITH CONTRAST  TECHNIQUE: Multidetector CT imaging of the abdomen and pelvis was performed using the standard protocol during bolus administration of intravenous contrast. Multiplanar reconstructed images and MIPs were obtained and reviewed to evaluate the vascular anatomy.  RADIATION DOSE REDUCTION: This exam was performed according to the departmental dose-optimization program which includes automated exposure control, adjustment of the mA and/or kV according to patient size and/or use of iterative reconstruction technique.  CONTRAST:  OMNIPAQUE  IOHEXOL  350 MG/ML SOLN  COMPARISON:  09/29/2023  FINDINGS: VASCULAR  Aorta: Normal caliber aorta without aneurysm,  dissection, vasculitis or significant stenosis. Aortic atherosclerosis.  Celiac: Patent  SMA: Patent  Renals: Patent  IMA: Patent  Inflow: Patent without evidence of aneurysm, dissection, vasculitis or significant stenosis.  Proximal Outflow: Bilateral common femoral and visualized portions of the superficial and profunda femoral arteries are patent without evidence of aneurysm, dissection, vasculitis or significant stenosis.  Veins: No obvious venous abnormality within the limitations of this arterial phase study.  Review of the MIP images confirms the above findings.  NON-VASCULAR  Lower chest: Distal esophageal wall thickening, new since prior study. No effusions.  Hepatobiliary: Severe diffuse fatty infiltration. Changes of cirrhosis, stable. Gallbladder grossly unremarkable.  Pancreas: No focal abnormality or ductal dilatation.  Spleen: No focal abnormality.  Normal size.  Adrenals/Urinary Tract: No adrenal abnormality. No focal renal abnormality. No stones or hydronephrosis. Urinary bladder is unremarkable.  Stomach/Bowel: Stomach and small bowel decompressed. Large bowel unremarkable. No contrast extravasation to localize GI bleed. No bowel obstruction or inflammatory process.  Lymphatic: No adenopathy  Reproductive: Uterus and adnexa unremarkable.  No mass.  Other: Large volume ascites, stable.  Musculoskeletal: No acute bony abnormality.  IMPRESSION: VASCULAR  Aortic atherosclerosis.  No active extravasation of contrast to localize GI bleed.  NON-VASCULAR  Changes of cirrhosis and hepatic steatosis. Stable large volume ascites.  Distal esophageal wall thickening, new since prior study. This could reflect esophagitis.  Electronically Signed   By: Franky Crease M.D.   On: 10/03/2023 19:34     Scheduled inpatient medications:   calcium  carbonate  1 tablet Oral TID WC   feeding supplement  237 mL Oral BID BM   folic acid   1 mg Oral Daily    furosemide  20 mg Intravenous Once   lidocaine   2 patch Transdermal Q24H   multivitamin with minerals  1 tablet Oral Daily   nicotine   21 mg Transdermal Daily   pantoprazole   40 mg Oral BID   sucralfate   1 g Oral Q6H   thiamine   100 mg Oral Daily   Continuous inpatient infusions:   albumin  human     promethazine  (PHENERGAN ) injection (IM or IVPB) Stopped (10/09/23 1018)   PRN inpatient medications: albuterol , alum & mag hydroxide-simeth, diphenhydrAMINE , mouth rinse, oxyCODONE , phenol, promethazine  (PHENERGAN ) injection (IM or IVPB)  Vital signs in last 24 hours: Temp:  [97.6 F (36.4 C)-99.4 F (37.4 C)] 99.4 F (37.4 C) (09/30 1151) Pulse Rate:  [91-98] 96 (09/30 1151) Resp:  [18-20] 18 (09/30 1151) BP: (90-112)/(54-61) 90/54 (09/30 1151) SpO2:  [96 %-100 %] 100 % (09/30 1151) Weight:  [41.9 kg] 41.9 kg (09/30 0500) Last BM Date : 10/08/23  Intake/Output Summary (Last 24 hours) at 10/09/2023 1438 Last data filed at 10/09/2023 0500 Gross per 24 hour  Intake 220 ml  Output --  Net 220 ml    Intake/Output from previous day: 09/29 0701 -  09/30 0700 In: 820 [P.O.:820] Out: -  Intake/Output this shift: No intake/output data recorded.   Physical Exam:  General: Alert female in NAD Heart:  Regular rate and rhythm.  Pulmonary: Normal respiratory effort Abdomen: Soft, moderately distended, nontender. Normal bowel sounds. Extremities: Bilateral lower extremity pitting edema.  Neurologic: Alert and oriented.  No asterixis Psych: Pleasant. Cooperative     LOS: 6 days   Vina Dasen ,NP 10/09/2023, 2:38 PM     Attending physician's note   I have taken a history, reviewed the chart, and examined the patient. I performed a substantive portion of this encounter, including complete performance of at least one of the key components, in conjunction with the APP. I agree with the APP's note, impression, and recommendations with my edits.   Odynophagia overall improving  and tolerating full liquids and order has been increased for soft foods and she is looking forward to that for dinner this evening.  No new labs for review today.  1) Acute esophageal necrosis - Continue Carafate  1 g 4 times daily x 2 weeks, then can reduce to 1 g twice daily - Continue Protonix  40 mg twice daily for 8 weeks, then reduce to 40 mg daily to promote mucosal healing - Slowly advancing to soft foods.  Can supplement with Ensure, boost, etc. to maintain appropriate caloric intake until tolerating prior diet - If continued odynophagia, can add viscous lidocaine  - Zofran  and Phenergan  as needed for nausea - No plan for relook endoscopy right now as long as she continues to improve clinically.  Otherwise, can plan for repeat upper endoscopy in about 8 weeks to ensure appropriate mucosal healing, no stricture, and can pick up on esophageal varices screening at that time.  This can be coordinated through her outpatient GI  2) Decompensated EtOH cirrhosis 3) Ascites 4) Coagulopathy 5) Thrombocytopenia - MELD 29 - Paracentesis on 9/25 negative for SBP - Trend daily CBC and CMP while in house - Can DC Rocephin  - Stopped octreotide  and albumin  - Low-sodium diet when she advances as above - Can start low-dose diuretics with Lasix 20 mg and Aldactone 50 mg when getting towards discharge - To follow-up with her primary care gastroenterologist at [facility]  Inpatient GI team will sign off at this time.  Please do not hesitate to contact us  with additional questions or concerns.  This encounter is notable for high level of MDM and complex medical disease.   [address], DO, FACG [phone] office

## 2023-10-09 NOTE — Progress Notes (Signed)
 Physical Therapy Treatment Patient Details Name: Joan Wood MRN: [id number] DOB: 10/21/1970 Today's Date: 10/09/2023   History of Present Illness 53 y.o. female presents to [facility] hospital on 10/03/2023 with nausea/vomiting and bloody diarrhea. Pt underwent upper EGD on 9/25, concerning for possible acute esophageal necrosis. Pt also underwent paracentesis on 9/25. PMH includes decompensated cirrhosis, PUD, GERD, pancreatitis, OSA.    PT Comments  Pt received in supine and lethargic initially, but agreeable to session once more alert. Pt requires increased time for all mobility tasks due to pain and dizziness. Pt able to stand, lateral step at EOB, and perform static standing marches with 1 seated rest break due to fatigue. Pt requests to use the BSC, but is unable to have BM. Pt requires min A for pivoting for RW management and cues for safety. Pt requires frequent redirection to task due to distraction and some confusion. Pt requests to sit in a recliner, but does not have one in room, RN notified. Pt continues to benefit from PT services to progress toward functional mobility goals.      If plan is discharge home, recommend the following: A lot of help with walking and/or transfers;A lot of help with bathing/dressing/bathroom;Assistance with cooking/housework;Assist for transportation;Help with stairs or ramp for entrance   Can travel by private vehicle     No  Equipment Recommendations  Hospital bed;BSC/3in1    Recommendations for Other Services       Precautions / Restrictions Precautions Precautions: Fall Recall of Precautions/Restrictions: Impaired Restrictions Weight Bearing Restrictions Per Provider Order: No     Mobility  Bed Mobility Overal bed mobility: Needs Assistance Bed Mobility: Supine to Sit, Sit to Supine     Supine to sit: Contact guard, HOB elevated, Used rails Sit to supine: Contact guard assist, Used rails   General bed mobility comments: Pt requesting to  complete without physical assist, requiring significantly increased time and effort. cues for technique    Transfers Overall transfer level: Needs assistance Equipment used: Rolling walker (2 wheels) Transfers: Sit to/from Stand, Bed to chair/wheelchair/BSC Sit to Stand: Min assist   Step pivot transfers: Min assist       General transfer comment: STS from EOB and BSC with cues for hand placement and min A for anterior weight shift. Step pivot to/from [facility] with min A for RW management    Ambulation/Gait                   Stairs             Wheelchair Mobility     Tilt Bed    Modified Rankin (Stroke Patients Only)       Balance Overall balance assessment: Needs assistance Sitting-balance support: No upper extremity supported, Feet supported Sitting balance-Leahy Scale: Fair Sitting balance - Comments: sitting EOB   Standing balance support: Bilateral upper extremity supported, Reliant on assistive device for balance, During functional activity Standing balance-Leahy Scale: Poor Standing balance comment: reliant on RW support                            Communication Communication Communication: No apparent difficulties  Cognition Arousal: Alert, Lethargic (lethargic initially) Behavior During Therapy: WFL for tasks assessed/performed, Anxious   PT - Cognitive impairments: Awareness, Safety/Judgement, Problem solving, Attention                       PT - Cognition Comments: intermittently confused, stating  she needs the bedpan when just placed on it. Pt tangential requiring redirection Following commands: Impaired Following commands impaired: Follows one step commands with increased time, Follows multi-step commands inconsistently    Cueing Cueing Techniques: Verbal cues, Visual cues, Tactile cues  Exercises General Exercises - Lower Extremity Hip Flexion/Marching: AROM, Standing, Both, 5 reps (x2)    General Comments         Pertinent Vitals/Pain Pain Assessment Pain Assessment: Faces Faces Pain Scale: Hurts little more Pain Location: back, skin at R hip Pain Descriptors / Indicators: Aching Pain Intervention(s): Limited activity within patient's tolerance, Monitored during session, Repositioned, Premedicated before session     PT Goals (current goals can now be found in the care plan section) Acute Rehab PT Goals Patient Stated Goal: to return to independence PT Goal Formulation: With patient Time For Goal Achievement: 10/21/23 Progress towards PT goals: Progressing toward goals    Frequency    Min 2X/week       AM-PAC PT 6 Clicks Mobility   Outcome Measure  Help needed turning from your back to your side while in a flat bed without using bedrails?: A Little Help needed moving from lying on your back to sitting on the side of a flat bed without using bedrails?: A Little Help needed moving to and from a bed to a chair (including a wheelchair)?: A Little Help needed standing up from a chair using your arms (e.g., wheelchair or bedside chair)?: A Little Help needed to walk in hospital room?: Total Help needed climbing 3-5 steps with a railing? : Total 6 Click Score: 14    End of Session   Activity Tolerance: Patient tolerated treatment well Patient left: with call bell/phone within reach;in bed;with bed alarm set Nurse Communication: Mobility status PT Visit Diagnosis: Other abnormalities of gait and mobility (R26.89);Muscle weakness (generalized) (M62.81)     Time: 8552-8446 PT Time Calculation (min) (ACUTE ONLY): 66 min  Charges:    $Therapeutic Exercise: 8-22 mins $Therapeutic Activity: 38-52 mins PT General Charges $$ ACUTE PT VISIT: 1 Visit                     Darryle George, PTA Acute Rehabilitation Services Secure Chat Preferred  [phone]    Darryle George 10/09/2023, 4:06 PM

## 2023-10-10 ENCOUNTER — Inpatient Hospital Stay (HOSPITAL_COMMUNITY)

## 2023-10-10 DIAGNOSIS — D62 Acute posthemorrhagic anemia: Secondary | ICD-10-CM | POA: Diagnosis not present

## 2023-10-10 LAB — GLUCOSE, CAPILLARY
Glucose-Capillary: 118 mg/dL — ABNORMAL HIGH (ref 70–99)
Glucose-Capillary: 132 mg/dL — ABNORMAL HIGH (ref 70–99)
Glucose-Capillary: 115 mg/dL — ABNORMAL HIGH (ref 70–99)
Glucose-Capillary: 108 mg/dL — ABNORMAL HIGH (ref 70–99)
Glucose-Capillary: 123 mg/dL — ABNORMAL HIGH (ref 70–99)
Glucose-Capillary: 113 mg/dL — ABNORMAL HIGH (ref 70–99)

## 2023-10-10 LAB — BASIC METABOLIC PANEL WITH GFR
Anion gap: 7 (ref 5–15)
Anion gap: 7 (ref 5–15)
BUN: <5 mg/dL — ABNORMAL LOW (ref 6–20)
BUN: <5 mg/dL — ABNORMAL LOW (ref 6–20)
CO2: 25 mmol/L (ref 22–32)
CO2: 25 mmol/L (ref 22–32)
Calcium: 7.6 mg/dL — ABNORMAL LOW (ref 8.9–10.3)
Calcium: 7.5 mg/dL — ABNORMAL LOW (ref 8.9–10.3)
Chloride: 93 mmol/L — ABNORMAL LOW (ref 98–111)
Chloride: 93 mmol/L — ABNORMAL LOW (ref 98–111)
Creatinine, Ser: 0.48 mg/dL (ref 0.44–1.00)
Creatinine, Ser: 0.44 mg/dL (ref 0.44–1.00)
GFR, Estimated: >60 mL/min (ref >60)
GFR, Estimated: >60 mL/min (ref >60)
Glucose, Bld: 127 mg/dL — ABNORMAL HIGH (ref 70–99)
Glucose, Bld: 120 mg/dL — ABNORMAL HIGH (ref 70–99)
Potassium: 2.8 mmol/L — ABNORMAL LOW (ref 3.5–5.1)
Potassium: 2.9 mmol/L — ABNORMAL LOW (ref 3.5–5.1)
Sodium: 125 mmol/L — ABNORMAL LOW (ref 135–145)
Sodium: 125 mmol/L — ABNORMAL LOW (ref 135–145)

## 2023-10-10 LAB — URINALYSIS, ROUTINE W REFLEX MICROSCOPIC
Bilirubin Urine: NEGATIVE
Glucose, UA: NEGATIVE mg/dL
Ketones, ur: NEGATIVE mg/dL
Nitrite: NEGATIVE
Protein, ur: NEGATIVE mg/dL
Specific Gravity, Urine: 1.006 (ref 1.005–1.030)
pH: 6 (ref 5.0–8.0)

## 2023-10-10 LAB — CBC
HCT: 23.3 % — ABNORMAL LOW (ref 36.0–46.0)
Hemoglobin: 7.9 g/dL — ABNORMAL LOW (ref 12.0–15.0)
MCH: 32.6 pg (ref 26.0–34.0)
MCHC: 33.9 g/dL (ref 30.0–36.0)
MCV: 96.3 fL (ref 80.0–100.0)
Platelets: 62 K/uL — ABNORMAL LOW (ref 150–400)
RBC: 2.42 MIL/uL — ABNORMAL LOW (ref 3.87–5.11)
RDW: 23.3 % — ABNORMAL HIGH (ref 11.5–15.5)
WBC: 3.8 K/uL — ABNORMAL LOW (ref 4.0–10.5)
nRBC: 0 % (ref 0.0–0.2)

## 2023-10-10 LAB — ALBUMIN: Albumin: 2.1 g/dL — ABNORMAL LOW (ref 3.5–5.0)

## 2023-10-10 LAB — PHOSPHORUS: Phosphorus: 2.1 mg/dL — ABNORMAL LOW (ref 2.5–4.6)

## 2023-10-10 MED ORDER — FUROSEMIDE 10 MG/ML IJ SOLN: 40.0000 mg | Freq: Once | INTRAMUSCULAR | Status: DC

## 2023-10-10 MED ORDER — NAPHAZOLINE-GLYCERIN 0.012-0.25 % OP SOLN
1.0000 [drp] | Freq: Four times a day (QID) | OPHTHALMIC | Status: DC | PRN
Start: 2023-10-10 — End: 2023-10-11
  Administered 2023-10-10: 1 [drp] via OPHTHALMIC
  Filled 2023-10-10 (×2): qty 15

## 2023-10-10 MED ORDER — POTASSIUM CHLORIDE 10 MEQ/100ML IV SOLN
10.0000 meq | INTRAVENOUS | Status: DC
  Administered 2023-10-10: 10 meq via INTRAVENOUS
  Filled 2023-10-10: qty 100

## 2023-10-10 MED ORDER — MIDODRINE HCL 5 MG PO TABS
10.0000 mg | ORAL_TABLET | Freq: Three times a day (TID) | ORAL | Status: DC
  Administered 2023-10-10 – 2023-10-11 (×2): 10 mg via ORAL
  Filled 2023-10-10 (×2): qty 2

## 2023-10-10 MED ORDER — MIDODRINE HCL 5 MG PO TABS
5.0000 mg | ORAL_TABLET | Freq: Three times a day (TID) | ORAL | Status: DC
  Administered 2023-10-10: 5 mg via ORAL
  Filled 2023-10-10: qty 1

## 2023-10-10 MED ORDER — POTASSIUM CHLORIDE CRYS ER 20 MEQ PO TBCR
40.0000 meq | EXTENDED_RELEASE_TABLET | Freq: Once | ORAL | Status: AC
  Administered 2023-10-10: 40 meq via ORAL
  Filled 2023-10-10: qty 2

## 2023-10-10 MED ORDER — DIPHENHYDRAMINE HCL 50 MG/ML IJ SOLN
25.0000 mg | Freq: Four times a day (QID) | INTRAMUSCULAR | Status: DC | PRN
  Administered 2023-10-10 – 2023-10-11 (×2): 25 mg via INTRAVENOUS
  Filled 2023-10-10 (×3): qty 1

## 2023-10-10 MED ORDER — POTASSIUM CHLORIDE CRYS ER 20 MEQ PO TBCR
40.0000 meq | EXTENDED_RELEASE_TABLET | ORAL | Status: AC
  Administered 2023-10-10 – 2023-10-11 (×3): 40 meq via ORAL
  Filled 2023-10-10 (×3): qty 2

## 2023-10-10 MED ORDER — ALBUMIN HUMAN 25 % IV SOLN: 25.0000 g | Freq: Four times a day (QID) | INTRAVENOUS | Status: DC

## 2023-10-10 MED ORDER — LIDOCAINE-EPINEPHRINE 1 %-1:100000 IJ SOLN
INTRAMUSCULAR | Status: AC
  Filled 2023-10-10: qty 1

## 2023-10-10 MED ORDER — MIDODRINE HCL 5 MG PO TABS
5.0000 mg | ORAL_TABLET | Freq: Once | ORAL | Status: AC
  Administered 2023-10-10: 5 mg via ORAL
  Filled 2023-10-10: qty 1

## 2023-10-10 MED ORDER — POTASSIUM CHLORIDE CRYS ER 20 MEQ PO TBCR: 40.0000 meq | EXTENDED_RELEASE_TABLET | ORAL | Status: DC

## 2023-10-10 MED ORDER — ALBUMIN HUMAN 25 % IV SOLN
25.0000 g | Freq: Four times a day (QID) | INTRAVENOUS | Status: DC
  Administered 2023-10-10 – 2023-10-11 (×2): 25 g via INTRAVENOUS
  Filled 2023-10-10 (×2): qty 100

## 2023-10-10 NOTE — Plan of Care (Signed)
  Problem: Health Behavior/Discharge Planning: Goal: Ability to manage health-related needs will improve Outcome: Progressing   Problem: Clinical Measurements: Goal: Diagnostic test results will improve Outcome: Progressing   Problem: Activity: Goal: Risk for activity intolerance will decrease Outcome: Progressing   Problem: Nutrition: Goal: Adequate nutrition will be maintained Outcome: Progressing   Problem: Coping: Goal: Level of anxiety will decrease Outcome: Progressing   Problem: Elimination: Goal: Will not experience complications related to bowel motility Outcome: Progressing   Problem: Pain Managment: Goal: General experience of comfort will improve and/or be controlled Outcome: Progressing   Problem: Safety: Goal: Ability to remain free from injury will improve Outcome: Progressing   Problem: Skin Integrity: Goal: Risk for impaired skin integrity will decrease Outcome: Progressing

## 2023-10-10 NOTE — Progress Notes (Signed)
 PROGRESS NOTE    Joan Wood  FMW:[id number] DOB: 21-Jun-1970 DOA: 10/03/2023 PCP: Edman Meade PEDLAR, FNP  Chief Complaint  Patient presents with   Emesis   Nausea   Diarrhea    Brief Narrative:   Joan Wood is Joan Wood 53 year old female with decompensated cirrhosis, PUD with recent gastric ulcer perforation s/p ex-lap, GERD, pancreatitis, OSA presented to [facility] with N/V/bloody diarrhea x 4 days. In the ED had coffee ground emesis. CTA GI neg for acute bleed, large ascites and new distal esophageal thickening. GI consulted for acute blood loss anemia with Hg 8.3>7. She was given PPI, started on octreotide  gtt, rocephin , vitamin K , pain and nausea control and admitted to hospitalist. GI was consulted to evaluate 9/25 AM. Hgb with drop from 8.3 >7 and 1 U PRBC ordered. GI saw patient and proceeded with upper endoscopy showing black discoloration of esophageal mucosa concerning for acute esophageal necrosis, portal hypertension, non-bleeding gastric and duodenal ulcers. Transferred to [facility] ICU for further management. She had paracentesis 10/04/23 with removed. GI following, continue PPI, octreotide , started clears, Hb remained stable transferred to [facility] service 10/06/23   Assessment & Plan:   Principal Problem:   GI bleed Active Problems:   Tobacco abuse   Alcohol use disorder   Hyponatremia   Nausea vomiting and diarrhea   Acute on chronic blood loss anemia   Liver cirrhosis (HCC)   Esophageal necrosis   Decompensated cirrhosis (HCC)   ABLA (acute blood loss anemia)  Acute upper GI bleed Acute esophageal necrosis Acute blood loss anemia Patient has h/o PUD, gastric ulcer perforation s/p sx lap 07/2023. Now s/p EGD showing grade D esophagitis, black discoloration mucosa in esophagus concerning for acute esophageal necrosis, portal hypertensive gastropathy, non bleeding gastric ulcer with clean ulcer base with apparent sutures.  No bleeding duodenal ulcers with Wilder Amodei clean ulcer base.  No  evidence of active bleeding or varices.   Transferred to [facility] given findings concerning for acute esophageal necrosis Gastrin level wnl  GI has now signed off, recommending carafate  x14 days, BID PPI until follow up with GI primary at [facility].  Needs ensure 2-3 times daily.  Currently on soft diet.     Decompensated alcoholic liver cirrhosis Volume Overload  Portal gastropathy Hyperbilirubinemia Ascites  Hypoalbuminemia  Thrombocytopenia  s/p paracentesis 9/25 3.3L removed. (SBP ruled out) S/p 5 days ceftriaxone   Repeat US  today without sufficient ascites to tap She's volume overloaded, but diuresis limited by hypotension - will start midodrine  and albumin , hopefully will give some room for diuresis    Hypervolemic Hyponatremia BP currently too soft for diuresing, giving albumin , starting midodrine  Will try to diurese as able once BP improved   Hypomagnesemia Follow    Tobacco abuse Continue nicotine  patch.   Alcohol abuse CIWA protocol. Continue thiamine , folate, MVT.  Severe Protein Calorie Malnutrition Body mass index is 16.9 kg/m. RD    DVT prophylaxis: SCD Code Status: full Family Communication: none Disposition:   Status is: Inpatient Remains inpatient appropriate because: need for continued inpatient care   Consultants:  GI  Procedures:  Upper endoscopy - grade D esophagitis, black discoloration mucosa in esophagus concerning for acute esophageal necrosis, portal hypertensive gastropathy, nonbleeding gastric ulcer with clean base with apparent sutures, non bleeding duodenal ulcers with clean base  Antimicrobials:  Anti-infectives (From admission, onward)    Start     Dose/Rate Route Frequency Ordered Stop   10/07/23 1000  fluconazole  (DIFLUCAN ) tablet 100 mg  Status:  Discontinued        100 mg Oral Daily 10/07/23 0853 10/07/23 0855   10/07/23 1000  fluconazole  (DIFLUCAN ) tablet 150 mg        150 mg Oral Daily 10/07/23 0855 10/07/23 1014    10/04/23 1000  cefTRIAXone  (ROCEPHIN ) 2 g in sodium chloride  0.9 % 100 mL IVPB        2 g 200 mL/hr over 30 Minutes Intravenous Every 24 hours 10/03/23 2154 10/08/23 0854   10/03/23 2015  cefTRIAXone  (ROCEPHIN ) 1 g in sodium chloride  0.9 % 100 mL IVPB        1 g 200 mL/hr over 30 Minutes Intravenous  Once 10/03/23 2010 10/03/23 2212       Subjective: C/o itching, abdominal and back pain  Objective: Vitals:   10/10/23 0322 10/10/23 0755 10/10/23 1140 10/10/23 1628  BP: (!) 97/58 (!) 85/53 (!) 81/55 (!) 90/56  Pulse: 93 95 93 96  Resp: 18 19 19 19   Temp: 98.6 F (37 C) 99 F (37.2 C) 98.4 F (36.9 C) 97.9 F (36.6 C)  TempSrc: Oral Oral Oral Oral  SpO2: 99% 100% 98% 99%  Weight:      Height:        Intake/Output Summary (Last 24 hours) at 10/10/2023 1945 Last data filed at 10/10/2023 0602 Gross per 24 hour  Intake 280 ml  Output --  Net 280 ml   Filed Weights   10/05/23 0500 10/08/23 0500 10/09/23 0500  Weight: 63.2 kg 61.7 kg 41.9 kg    Examination:  General exam: Appears calm and comfortable  Respiratory system: unlabored Cardiovascular system: RRR Gastrointestinal system: distended, TTP Central nervous system: Alert and oriented. No focal neurological deficits. Extremities: bilateral LE edema   Data Reviewed: I have personally reviewed following labs and imaging studies  CBC: Recent Labs  Lab 10/05/23 0351 10/06/23 0245 10/07/23 0222 10/08/23 0448 10/10/23 0540  WBC 4.3 3.3* 4.3 3.3* 3.8*  HGB 8.7* 9.2* 8.8* 7.9* 7.9*  HCT 24.8* 27.5* 26.0* 23.4* 23.3*  MCV 90.5 92.3 93.5 95.5 96.3  PLT 55* 58* 51* 44* 62*    Basic Metabolic Panel: Recent Labs  Lab 10/04/23 1904 10/05/23 0351 10/05/23 0640 10/06/23 0245 10/07/23 0222 10/08/23 0448 10/10/23 0540 10/10/23 1600  NA  --  123*   < > 124* 125* 125* 125* 125*  K  --  3.7   < > 3.6 3.7 4.1 2.8* 2.9*  CL  --  91*   < > 91* 95* 96* 93* 93*  CO2  --  23   < > 26 24 23 25 25   GLUCOSE  --  75   <  > 149* 111* 78 127* 120*  BUN  --  <5*   < > <5* <5* <5* <5* <5*  CREATININE  --  0.56   < > 0.55 0.49 0.53 0.48 0.44  CALCIUM   --  7.3*   < > 7.4* 7.3* 7.6* 7.6* 7.5*  MG 2.1  --   --  1.5*  --  1.6*  --   --   PHOS  --  2.4*  --  2.6 2.5 2.5 2.1*  --    < > = values in this interval not displayed.    GFR: Estimated Creatinine Clearance: 53.2 mL/min (by C-G formula based on SCr of 0.44 mg/dL).  Liver Function Tests: Recent Labs  Lab 10/04/23 0307 10/05/23 0351 10/06/23 0245 10/06/23 2045 10/07/23 0222 10/08/23 0448 10/10/23 1600  AST 123* 103*  --  70*  --   --   --   ALT 29 28  --  24  --   --   --   ALKPHOS 69 56  --  45  --   --   --   BILITOT 4.8* 4.3*  --  3.8*  --   --   --   PROT 6.2* 5.6*  --  4.5*  --   --   --   ALBUMIN  1.7* <1.5* <1.5* <1.5* <1.5* 2.2* 2.1*    CBG: Recent Labs  Lab 10/10/23 0325 10/10/23 0757 10/10/23 1140 10/10/23 1629 10/10/23 1940  GLUCAP 118* 132* 115* 108* 123*     Recent Results (from the past 240 hours)  Resp panel by RT-PCR (RSV, Flu Krystiana Fornes&B, Covid) Anterior Nasal Swab     Status: None   Collection Time: 10/03/23  3:25 PM   Specimen: Anterior Nasal Swab  Result Value Ref Range Status   SARS Coronavirus 2 by RT PCR NEGATIVE NEGATIVE Final    Comment: (NOTE) SARS-CoV-2 target nucleic acids are NOT DETECTED.  The SARS-CoV-2 RNA is generally detectable in upper respiratory specimens during the acute phase of infection. The lowest concentration of SARS-CoV-2 viral copies this assay can detect is 138 copies/mL. Telissa Palmisano negative result does not preclude SARS-Cov-2 infection and should not be used as the sole basis for treatment or other patient management decisions. Tayelor Osborne negative result may occur with  improper specimen collection/handling, submission of specimen other than nasopharyngeal swab, presence of viral mutation(s) within the areas targeted by this assay, and inadequate number of viral copies(<138 copies/mL). Dameisha Tschida negative result must  be combined with clinical observations, patient history, and epidemiological information. The expected result is Negative.  Fact Sheet for Patients:  BloggerCourse.com  Fact Sheet for Healthcare Providers:  SeriousBroker.it  This test is no t yet approved or cleared by the [place]  FDA and  has been authorized for detection and/or diagnosis of SARS-CoV-2 by FDA under an Emergency Use Authorization (EUA). This EUA will remain  in effect (meaning this test can be used) for the duration of the COVID-19 declaration under Section 564(b)(1) of the Act, 21 U.S.C.section 360bbb-3(b)(1), unless the authorization is terminated  or revoked sooner.       Influenza [place] by PCR NEGATIVE NEGATIVE Final   Influenza B by PCR NEGATIVE NEGATIVE Final    Comment: (NOTE) The Xpert Xpress SARS-CoV-2/FLU/RSV plus assay is intended as an aid in the diagnosis of influenza from Nasopharyngeal swab specimens and should not be used as [place] sole basis for treatment. Nasal washings and aspirates are unacceptable for Xpert Xpress SARS-CoV-2/FLU/RSV testing.  Fact Sheet for Patients: BloggerCourse.com  Fact Sheet for Healthcare Providers: SeriousBroker.it  This test is not yet approved or cleared by the [place]  FDA and has been authorized for detection and/or diagnosis of SARS-CoV-2 by FDA under an Emergency Use Authorization (EUA). This EUA will remain in effect (meaning this test can be used) for the duration of the COVID-19 declaration under Section 564(b)(1) of the Act, 21 U.S.C. section 360bbb-3(b)(1), unless the authorization is terminated or revoked.     Resp Syncytial Virus by PCR NEGATIVE NEGATIVE Final    Comment: (NOTE) Fact Sheet for Patients: BloggerCourse.com  Fact Sheet for Healthcare Providers: SeriousBroker.it  This test is not  yet approved or cleared by the [place]  FDA and has been authorized for detection and/or diagnosis of SARS-CoV-2 by FDA under an Emergency Use Authorization (EUA). This EUA will remain in effect (meaning  this test can be used) for the duration of the COVID-19 declaration under Section 564(b)(1) of the Act, 21 U.S.C. section 360bbb-3(b)(1), unless the authorization is terminated or revoked.  Performed at [facility], [address] [place], [name] [address]   MRSA Next Gen by PCR, Nasal     Status: None   Collection Time: 10/03/23 10:20 PM   Specimen: Nasal Mucosa; Nasal Swab  Result Value Ref Range Status   MRSA by PCR Next Gen NOT DETECTED NOT DETECTED Final    Comment: (NOTE) The GeneXpert MRSA Assay (FDA approved for NASAL specimens only), is one component of Jezebelle Ledwell comprehensive MRSA colonization surveillance program. It is not intended to diagnose MRSA infection nor to guide or monitor treatment for MRSA infections. Test performance is not FDA approved in patients less than 77 years old. Performed at [facility], [address] [place], [name] [address]   Peritoneal fluid culture w Gram Stain     Status: None   Collection Time: 10/04/23  6:23 PM   Specimen: Peritoneal Washings; Peritoneal Fluid  Result Value Ref Range Status   Specimen Description PERITONEAL  Final   Special Requests NONE  Final   Gram Stain   Final    WBC PRESENT,BOTH PMN AND MONONUCLEAR NO ORGANISMS SEEN CYTOSPIN SMEAR    Culture   Final    NO GROWTH 3 DAYS Performed at [facility], [address] [place], [name] [address]    Report Status 10/08/2023 FINAL  Final         Radiology Studies: IR ABDOMEN US  LIMITED Result Date: 10/10/2023 CLINICAL DATA:  Patient with history of decompensated alcoholic liver cirrhosis, ascites. Request for paracentesis. EXAM: LIMITED ABDOMEN ULTRASOUND FOR ASCITES TECHNIQUE: Limited ultrasound survey for ascites was performed in all four abdominal  quadrants. COMPARISON:  None Available. FINDINGS: No evidence of ascites in all four abdominal quadrants. IMPRESSION: No ascites amendable to paracentesis.  No procedure performed. Performed by: Kristi Davenport, NP Electronically Signed   By: Wilkie Lent M.D.   On: 10/10/2023 15:27        Scheduled Meds:  calcium  carbonate  1 tablet Oral TID WC   feeding supplement  237 mL Oral BID BM   folic acid   1 mg Oral Daily   furosemide  40 mg Intravenous Once   lidocaine   2 patch Transdermal Q24H   [START ON 10/11/2023] midodrine   10 mg Oral TID WC   midodrine   5 mg Oral Once   multivitamin with minerals  1 tablet Oral Daily   nicotine   21 mg Transdermal Daily   pantoprazole   40 mg Oral BID   sucralfate   1 g Oral Q6H   thiamine   100 mg Oral Daily   Continuous Infusions:  albumin  human     promethazine  (PHENERGAN ) injection (IM or IVPB) Stopped (10/09/23 1018)     LOS: 7 days    Time spent: over 30 min     Meliton Monte, MD [facility]   To contact the attending provider between 7A-7P or the covering provider during after hours 7P-7A, please log into the web site [email or web address] and access using universal [place] password for that web site. If you do not have the password, please call the hospital operator.  10/10/2023, 7:45 PM

## 2023-10-10 NOTE — TOC Progression Note (Signed)
 Transition of Care [facility]) - Progression Note    Patient Details  Name: Joan Wood MRN: [id number] Date of Birth: 15-Jun-1970  [facility] CM/SW Contact  [name], [name] Phone Number: 10/10/2023, 3:57 PM  Clinical Narrative:   CSW noting per chart review that patient has not been able to ambulate with PT. CSW coordinated with OT assigned to patient today to discuss needs. CSW met with patient to discuss recommendation for SNF again, patient continues to adamantly refuse. Patient admitted that she will struggle at home alone while her roommate is at work, but said she will not consider anything other than going home. CSW discussed outpatient rehab, and patient unsure if she can drive at this time but she can use her Medicaid transport. Patient requested that CSW contact both the MD and RN about pain and itching, message sent. CSW to follow.    Expected Discharge Plan: OP Rehab Barriers to Discharge: Continued Medical Work up               Expected Discharge Plan and Services In-house Referral: Clinical Social Work Discharge Planning Services: CM Consult Post Acute Care Choice: Home Health Living arrangements for the past 2 months: Single Family Home                 DME Arranged: Walker rolling   Date DME Agency Contacted: 10/08/23   Representative spoke with at DME Agency: [name]             Social Drivers of Health (SDOH) Interventions SDOH Screenings   Food Insecurity: No Food Insecurity (10/03/2023)  Housing: Low Risk  (10/03/2023)  Transportation Needs: No Transportation Needs (10/03/2023)  Recent Concern: Transportation Needs - Unmet Transportation Needs (08/20/2023)  Utilities: Not At Risk (10/03/2023)  Depression (PHQ2-9): High Risk (07/30/2023)  Social Connections: Socially Isolated (10/03/2023)  Tobacco Use: High Risk (10/04/2023)    Readmission Risk Interventions    10/04/2023    9:02 AM 09/03/2023   12:31 PM 09/02/2023    9:01 AM   Readmission Risk Prevention Plan  Transportation Screening Complete Complete Complete  PCP or Specialist Appt within 3-5 Days   Complete  HRI or Home Care Consult   Complete  Social Work Consult for Recovery Care Planning/Counseling   Complete  Palliative Care Screening   Not Applicable  Medication Review Oceanographer) Complete Complete Complete  HRI or Home Care Consult Complete Complete   SW Recovery Care/Counseling Consult Complete Complete   Palliative Care Screening Not Applicable Not Applicable   Skilled Nursing Facility Not Applicable Patient Refused

## 2023-10-10 NOTE — Evaluation (Signed)
 Occupational Therapy Evaluation Patient Details Name: Joan Wood MRN: [id number] DOB: May 16, 1970 Today's Date: 10/10/2023   History of Present Illness   53 y.o. female presents to [facility] hospital on 10/03/2023 with nausea/vomiting and bloody diarrhea. Pt underwent upper EGD on 9/25, concerning for possible acute esophageal necrosis. Pt also underwent paracentesis on 9/25. PMH includes decompensated cirrhosis, PUD, GERD, pancreatitis, OSA.     Clinical Impressions Patient supine in bed and agreeable to limited OT evaluation.  Patient declines OOB, reports too much pain in B Les and stomach/back as well as fatigued from not sleeping last night (due to pain).  She requires min assist to transition into long sitting, and able to complete Adls with setup to mod assist from upright in bed.   Pt deferred transfers this date.  She does requires cueing to attend to task, recall what she was doing, and problem solve.  She reports PTA needing increased time and having difficulty with ADLs, limited mobility at home; does not have support during the week as her roommate is working.  Based on performance today, believe patient will best benefit from continued OT services acutely and after dc at inpatient setting with <3hrs/day to optimize independence, safety with ADLs and mobility.      If plan is discharge home, recommend the following:   A lot of help with walking and/or transfers;A lot of help with bathing/dressing/bathroom;Direct supervision/assist for medications management;Direct supervision/assist for financial management     Functional Status Assessment   Patient has had a recent decline in their functional status and demonstrates the ability to make significant improvements in function in a reasonable and predictable amount of time.     Equipment Recommendations   BSC/3in1     Recommendations for Other Services         Precautions/Restrictions   Precautions Precautions:  Fall Recall of Precautions/Restrictions: Impaired Restrictions Weight Bearing Restrictions Per Provider Order: No     Mobility Bed Mobility Overal bed mobility: Needs Assistance Bed Mobility: Supine to Sit, Sit to Supine     Supine to sit: Min assist Sit to supine: Contact guard assist   General bed mobility comments: to transition into long sitting, refused OOB    Transfers   Equipment used: Rolling walker (2 wheels)                      Balance Overall balance assessment: Needs assistance Sitting-balance support: No upper extremity supported, Feet supported Sitting balance-Leahy Scale: Fair Sitting balance - Comments: long sitting in bed                                   ADL either performed or assessed with clinical judgement   ADL Overall ADL's : Needs assistance/impaired     Grooming: Set up;Sitting           Upper Body Dressing : Minimal assistance;Sitting   Lower Body Dressing: Moderate assistance;Sitting/lateral leans;Bed level Lower Body Dressing Details (indicate cue type and reason): from long sitting and supine   Toilet Transfer Details (indicate cue type and reason): deferred         Functional mobility during ADLs: Minimal assistance General ADL Comments: to pull into long sitting, declined OOB     Vision   Vision Assessment?: No apparent visual deficits     Perception         Praxis  Pertinent Vitals/Pain Pain Assessment Pain Assessment: Faces Faces Pain Scale: Hurts even more Pain Location: back, stomach, legs Pain Descriptors / Indicators: Aching, Discomfort Pain Intervention(s): Limited activity within patient's tolerance, Monitored during session, Repositioned     Extremity/Trunk Assessment Upper Extremity Assessment Upper Extremity Assessment: Generalized weakness   Lower Extremity Assessment Lower Extremity Assessment: Defer to PT evaluation   Cervical / Trunk Assessment Cervical /  Trunk Assessment: Kyphotic   Communication Communication Communication: No apparent difficulties   Cognition Arousal: Alert, Lethargic (lethargic) Behavior During Therapy: WFL for tasks assessed/performed, Anxious Cognition: Cognition impaired     Awareness: Online awareness impaired Memory impairment (select all impairments): Short-term memory, Working memory Attention impairment (select first level of impairment): Sustained attention Executive functioning impairment (select all impairments): Initiation, Organization, Sequencing, Problem solving, Reasoning OT - Cognition Comments: pt following simple commands but requires cueing to complete tasks, easily distracted with poor recall of what she was doing                 Following commands: Impaired Following commands impaired: Follows one step commands with increased time, Follows multi-step commands inconsistently     Cueing  General Comments   Cueing Techniques: Verbal cues;Visual cues;Tactile cues      Exercises     Shoulder Instructions      Home Living Family/patient expects to be discharged to:: Private residence Living Arrangements: Non-relatives/Friends Available Help at Discharge: Friend(s);Available PRN/intermittently (roomate works as a Naval architect and is gone during the week) Type of Home: House Home Access: Stairs to enter Secretary/administrator of Steps: 5 Entrance Stairs-Rails: Right Home Layout: One level     Bathroom Shower/Tub: Chief Strategy Officer: Standard     Home Equipment: Cane - single point;Wheelchair - manual;Shower seat          Prior Functioning/Environment Prior Level of Function : Independent/Modified Independent             Mobility Comments: pt reports independence in mobility until the last 4-6 weeks. since she has been mobilizing for very short household distances and requiring support of her [facility] or furniture ADLs Comments: pt reports independence at  baseline but has been having great difficulty with ADLs in the last 4-6 weeks. Sometimes the pt has been going multiple days without bathing or changing clothes. reports can manage microwave meals    OT Problem List: Decreased strength;Decreased activity tolerance;Impaired balance (sitting and/or standing);Pain;Decreased knowledge of use of DME or AE;Decreased safety awareness;Decreased cognition   OT Treatment/Interventions: Self-care/ADL training;Therapeutic exercise;DME and/or AE instruction;Therapeutic activities;Cognitive remediation/compensation;Balance training;Patient/family education;Energy conservation      OT Goals(Current goals can be found in the care plan section)   Acute Rehab OT Goals Patient Stated Goal: get home OT Goal Formulation: With patient Time For Goal Achievement: 10/24/23 Potential to Achieve Goals: Fair   OT Frequency:  Min 2X/week    Co-evaluation              AM-PAC OT 6 Clicks Daily Activity     Outcome Measure Help from another person eating meals?: A Little Help from another person taking care of personal grooming?: A Little Help from another person toileting, which includes using toliet, bedpan, or urinal?: A Lot Help from another person bathing (including washing, rinsing, drying)?: A Lot Help from another person to put on and taking off regular upper body clothing?: A Little Help from another person to put on and taking off regular lower body clothing?: A Lot 6 Click Score:  15   End of Session Nurse Communication: Mobility status;Patient requests pain meds  Activity Tolerance: Patient limited by pain;Patient limited by fatigue Patient left: in bed;with call bell/phone within reach;with bed alarm set  OT Visit Diagnosis: Other abnormalities of gait and mobility (R26.89);Muscle weakness (generalized) (M62.81);Pain;Other symptoms and signs involving cognitive function Pain - part of body:  (stomach, B LES)                Time:  8860-8840 OT Time Calculation (min): 20 min Charges:  OT General Charges $OT Visit: 1 Visit OT Evaluation $OT Eval Moderate Complexity: 1 Mod  Etta NOVAK, OT Acute Rehabilitation Services Office [phone] Secure Chat Preferred    Etta GORMAN Hope 10/10/2023, 1:36 PM

## 2023-10-10 NOTE — Progress Notes (Signed)
 Limited abdominal ultrasound revealed no ascites in all four quadrants. No procedure performed.      Joan Wood B Riona Lahti NP 10/10/2023 2:47 PM

## 2023-10-11 ENCOUNTER — Inpatient Hospital Stay (HOSPITAL_COMMUNITY)

## 2023-10-11 DIAGNOSIS — R008 Other abnormalities of heart beat: Secondary | ICD-10-CM

## 2023-10-11 DIAGNOSIS — K922 Gastrointestinal hemorrhage, unspecified: Secondary | ICD-10-CM | POA: Diagnosis not present

## 2023-10-11 LAB — COMPREHENSIVE METABOLIC PANEL WITH GFR
ALT: 24 U/L (ref 0–44)
AST: 87 U/L — ABNORMAL HIGH (ref 15–41)
Albumin: 2.6 g/dL — ABNORMAL LOW (ref 3.5–5.0)
Alkaline Phosphatase: 27 U/L — ABNORMAL LOW (ref 38–126)
Anion gap: 5 (ref 5–15)
BUN: <5 mg/dL — ABNORMAL LOW (ref 6–20)
CO2: 25 mmol/L (ref 22–32)
Calcium: 8 mg/dL — ABNORMAL LOW (ref 8.9–10.3)
Chloride: 96 mmol/L — ABNORMAL LOW (ref 98–111)
Creatinine, Ser: 0.41 mg/dL — ABNORMAL LOW (ref 0.44–1.00)
GFR, Estimated: >60 mL/min (ref >60)
Glucose, Bld: 89 mg/dL (ref 70–99)
Potassium: 4.7 mmol/L (ref 3.5–5.1)
Sodium: 126 mmol/L — ABNORMAL LOW (ref 135–145)
Total Bilirubin: 12 mg/dL — ABNORMAL HIGH (ref 0.0–1.2)
Total Protein: 5 g/dL — ABNORMAL LOW (ref 6.5–8.1)

## 2023-10-11 LAB — PROTIME-INR
INR: 2.7 — ABNORMAL HIGH (ref 0.8–1.2)
Prothrombin Time: 30.2 s — ABNORMAL HIGH (ref 11.4–15.2)

## 2023-10-11 LAB — CBC WITH DIFFERENTIAL/PLATELET
Abs Immature Granulocytes: 0.05 K/uL (ref 0.00–0.07)
Basophils Absolute: 0 K/uL (ref 0.0–0.1)
Basophils Relative: 0 %
Eosinophils Absolute: 0.1 K/uL (ref 0.0–0.5)
Eosinophils Relative: 1 %
HCT: 23.7 % — ABNORMAL LOW (ref 36.0–46.0)
Hemoglobin: 7.9 g/dL — ABNORMAL LOW (ref 12.0–15.0)
Immature Granulocytes: 1 %
Lymphocytes Relative: 17 %
Lymphs Abs: 1 K/uL (ref 0.7–4.0)
MCH: 32.2 pg (ref 26.0–34.0)
MCHC: 33.3 g/dL (ref 30.0–36.0)
MCV: 96.7 fL (ref 80.0–100.0)
Monocytes Absolute: 1.4 K/uL — ABNORMAL HIGH (ref 0.1–1.0)
Monocytes Relative: 25 %
Neutro Abs: 3.1 K/uL (ref 1.7–7.7)
Neutrophils Relative %: 56 %
Platelets: 81 K/uL — ABNORMAL LOW (ref 150–400)
RBC: 2.45 MIL/uL — ABNORMAL LOW (ref 3.87–5.11)
RDW: 23.2 % — ABNORMAL HIGH (ref 11.5–15.5)
WBC: 5.6 K/uL (ref 4.0–10.5)
nRBC: 0 % (ref 0.0–0.2)

## 2023-10-11 LAB — MAGNESIUM: Magnesium: 1.6 mg/dL — ABNORMAL LOW (ref 1.7–2.4)

## 2023-10-11 LAB — GLUCOSE, CAPILLARY
Glucose-Capillary: 91 mg/dL (ref 70–99)
Glucose-Capillary: 74 mg/dL (ref 70–99)
Glucose-Capillary: 82 mg/dL (ref 70–99)
Glucose-Capillary: 85 mg/dL (ref 70–99)
Glucose-Capillary: 125 mg/dL — ABNORMAL HIGH (ref 70–99)

## 2023-10-11 LAB — ECHOCARDIOGRAM COMPLETE
Area-P 1/2: 3.34 cm2
Calc EF: 77.2 %
Height: 62 in
S' Lateral: 2 cm
Single Plane A2C EF: 78.3 %
Single Plane A4C EF: 74.2 %
Weight: 1477.96 [oz_av]

## 2023-10-11 LAB — PHOSPHORUS: Phosphorus: 1.8 mg/dL — ABNORMAL LOW (ref 2.5–4.6)

## 2023-10-11 MED ORDER — ALBUMIN HUMAN 25 % IV SOLN
25.0000 g | Freq: Four times a day (QID) | INTRAVENOUS | Status: AC
  Administered 2023-10-11 (×2): 25 g via INTRAVENOUS
  Filled 2023-10-11 (×2): qty 100

## 2023-10-11 MED ORDER — SODIUM CHLORIDE 0.9 % IV SOLN
6.2500 mg | Freq: Four times a day (QID) | INTRAVENOUS | Status: DC | PRN
  Filled 2023-10-11: qty 0.25

## 2023-10-11 MED ORDER — BISACODYL 10 MG RE SUPP
10.0000 mg | Freq: Once | RECTAL | Status: DC
  Filled 2023-10-11: qty 1

## 2023-10-11 MED ORDER — ONDANSETRON HCL 4 MG/2ML IJ SOLN
4.0000 mg | Freq: Three times a day (TID) | INTRAMUSCULAR | Status: DC | PRN
  Administered 2023-10-12 – 2023-10-18 (×7): 4 mg via INTRAVENOUS
  Filled 2023-10-11 (×9): qty 2

## 2023-10-11 MED ORDER — SODIUM PHOSPHATES 45 MMOLE/15ML IV SOLN
45.0000 mmol | Freq: Once | INTRAVENOUS | Status: AC
  Administered 2023-10-11: 45 mmol via INTRAVENOUS
  Filled 2023-10-11: qty 15

## 2023-10-11 MED ORDER — SPIRONOLACTONE 25 MG PO TABS: 25.0000 mg | ORAL_TABLET | Freq: Every day | ORAL | Status: DC

## 2023-10-11 MED ORDER — FUROSEMIDE 20 MG PO TABS
20.0000 mg | ORAL_TABLET | Freq: Every day | ORAL | Status: DC
  Administered 2023-10-11 – 2023-10-13 (×3): 20 mg via ORAL
  Filled 2023-10-11 (×3): qty 1

## 2023-10-11 MED ORDER — MIDODRINE HCL 5 MG PO TABS
5.0000 mg | ORAL_TABLET | Freq: Once | ORAL | Status: AC
  Administered 2023-10-11: 5 mg via ORAL
  Filled 2023-10-11: qty 1

## 2023-10-11 MED ORDER — MIDODRINE HCL 5 MG PO TABS
15.0000 mg | ORAL_TABLET | Freq: Three times a day (TID) | ORAL | Status: DC
  Administered 2023-10-11 (×2): 15 mg via ORAL
  Filled 2023-10-11 (×2): qty 3

## 2023-10-11 MED ORDER — GABAPENTIN 100 MG PO CAPS
100.0000 mg | ORAL_CAPSULE | Freq: Three times a day (TID) | ORAL | Status: DC
  Administered 2023-10-11 – 2023-10-13 (×7): 100 mg via ORAL
  Filled 2023-10-11 (×7): qty 1

## 2023-10-11 MED ORDER — MAGNESIUM SULFATE 2 GM/50ML IV SOLN
2.0000 g | Freq: Once | INTRAVENOUS | Status: AC
  Administered 2023-10-11: 2 g via INTRAVENOUS
  Filled 2023-10-11: qty 50

## 2023-10-11 MED ORDER — ALBUMIN HUMAN 25 % IV SOLN
25.0000 g | Freq: Four times a day (QID) | INTRAVENOUS | Status: DC
Start: 2023-10-11 — End: 2023-10-11

## 2023-10-11 MED ORDER — DIPHENHYDRAMINE HCL 25 MG PO CAPS
25.0000 mg | ORAL_CAPSULE | Freq: Four times a day (QID) | ORAL | Status: DC | PRN
  Administered 2023-10-11 – 2023-11-25 (×33): 25 mg via ORAL
  Filled 2023-10-11 (×36): qty 1

## 2023-10-11 MED ORDER — IOHEXOL 350 MG/ML SOLN
75.0000 mL | Freq: Once | INTRAVENOUS | Status: AC | PRN
  Administered 2023-10-11: 75 mL via INTRAVENOUS

## 2023-10-11 MED ORDER — POLYVINYL ALCOHOL 1.4 % OP SOLN
2.0000 [drp] | OPHTHALMIC | Status: AC | PRN
  Administered 2023-10-23 – 2024-01-25 (×2): 2 [drp] via OPHTHALMIC
  Filled 2023-10-11 (×2): qty 15

## 2023-10-11 NOTE — Progress Notes (Signed)
 PT Cancellation Note  Patient Details Name: Joan Wood MRN: [id number] DOB: 11/02/1970   Cancelled Treatment:    Reason Eval/Treat Not Completed: (P) Patient declined, no reason specified (Attempted to see pt x3, but pt requests therapy come back tomorrow. Will continue to follow per PT POC.)   Darryle George 10/11/2023, 2:06 PM

## 2023-10-11 NOTE — Plan of Care (Signed)

## 2023-10-11 NOTE — Progress Notes (Signed)
  Echocardiogram 2D Echocardiogram has been performed.  Joan Wood ORN Joan Wood 10/11/2023, 9:10 AM

## 2023-10-11 NOTE — Progress Notes (Addendum)
 PROGRESS NOTE    [name]  FMW:[id number] DOB: 16-Sep-1970 DOA: 10/03/2023 PCP: Edman Meade PEDLAR, FNP  Chief Complaint  Patient presents with   Emesis   Nausea   Diarrhea    Brief Narrative:   Joan Wood is Joan Wood 53 year old female with decompensated cirrhosis, PUD with recent gastric ulcer perforation s/p ex-lap, GERD, pancreatitis, OSA presented to [facility] with N/V/bloody diarrhea x 4 days. In the ED had coffee ground emesis. CTA GI neg for acute bleed, large ascites and new distal esophageal thickening. GI consulted for acute blood loss anemia with Hg 8.3>7. She was given PPI, started on octreotide  gtt, rocephin , vitamin K , pain and nausea control and admitted to hospitalist. GI was consulted to evaluate 9/25 AM. Hgb with drop from 8.3 >7 and 1 U PRBC ordered. GI saw patient and proceeded with upper endoscopy showing black discoloration of esophageal mucosa concerning for acute esophageal necrosis, portal hypertension, non-bleeding gastric and duodenal ulcers. Transferred to [facility] ICU for further management. She had paracentesis 10/04/23 with removed. GI following, continue PPI, octreotide , started clears, Hb remained stable transferred to [facility] service 10/06/23   Assessment & Plan:   Principal Problem:   GI bleed Active Problems:   Tobacco abuse   Alcohol use disorder   Hyponatremia   Nausea vomiting and diarrhea   Acute on chronic blood loss anemia   Liver cirrhosis (HCC)   Esophageal necrosis   Decompensated cirrhosis (HCC)   ABLA (acute blood loss anemia)  Acute upper GI bleed Acute esophageal necrosis Acute blood loss anemia Patient has h/o PUD, gastric ulcer perforation s/p sx lap 07/2023. Now s/p EGD showing grade D esophagitis, black discoloration mucosa in esophagus concerning for acute esophageal necrosis, portal hypertensive gastropathy, non bleeding gastric ulcer with clean ulcer base with apparent sutures.  No bleeding duodenal ulcers with Thaxton Pelley clean ulcer base.  No  evidence of active bleeding or varices.   Transferred to [facility] given findings concerning for acute esophageal necrosis Gastrin level wnl  GI has now signed off, recommending carafate  x14 days, BID PPI until follow up with GI primary at [facility].  Needs ensure 2-3 times daily.  Currently on soft diet.     Decompensated alcoholic liver cirrhosis Volume Overload  Portal gastropathy Hyperbilirubinemia Ascites  Hypoalbuminemia  Thrombocytopenia  s/p paracentesis 9/25 3.3L removed. (SBP ruled out) S/p 5 days ceftriaxone   Repeat US  10/1 without sufficient ascites to tap She's volume overloaded, but diuresis limited by hypotension - started midodrine  and albumin , hopefully will give some room for diuresis  Echo with preserved EF Meld 10/2 is 32, 52.6% 3 month mortality    Abdominal Pain In setting of above Will repeat CT   Right Lower Extremity Edema Follow LE US   More tender as well, consider cellulitis, but not particularly erythematous and normal white count - will consider additional imaging based on US  findings  Hypervolemic Hyponatremia BP currently too soft for diuresing, giving albumin , starting midodrine  Will try to diurese as able once BP improved   Hypomagnesemia  Hypophosphatemia Follow, replace and follow    Tobacco abuse Continue nicotine  patch.   Alcohol abuse CIWA protocol. Continue thiamine , folate, MVT.  Severe Protein Calorie Malnutrition Body mass index is 16.9 kg/m. RD    DVT prophylaxis: SCD Code Status: full Family Communication: none Disposition:   Status is: Inpatient Remains inpatient appropriate because: need for continued inpatient care   Consultants:  GI  Procedures:  Upper endoscopy - grade D esophagitis, black discoloration  mucosa in esophagus concerning for acute esophageal necrosis, portal hypertensive gastropathy, nonbleeding gastric ulcer with clean base with apparent sutures, non bleeding duodenal ulcers with clean  base  Echo IMPRESSIONS     1. Left ventricular ejection fraction, by estimation, is 70 to 75%. The  left ventricle has hyperdynamic function. The left ventricle has no  regional wall motion abnormalities. Left ventricular diastolic parameters  were normal.   2. Right ventricular systolic function is normal. The right ventricular  size is normal. Tricuspid regurgitation signal is inadequate for assessing  PA pressure.   3. The mitral valve is grossly normal. Trivial mitral valve  regurgitation.   4. The aortic valve is tricuspid. Aortic valve regurgitation is not  visualized.   5. The inferior vena cava is normal in size with greater than 50%  respiratory variability, suggesting right atrial pressure of 3 mmHg.   Comparison(s): No prior Echocardiogram.   Antimicrobials:  Anti-infectives (From admission, onward)    Start     Dose/Rate Route Frequency Ordered Stop   10/07/23 1000  fluconazole  (DIFLUCAN ) tablet 100 mg  Status:  Discontinued        100 mg Oral Daily 10/07/23 0853 10/07/23 0855   10/07/23 1000  fluconazole  (DIFLUCAN ) tablet 150 mg        150 mg Oral Daily 10/07/23 0855 10/07/23 1014   10/04/23 1000  cefTRIAXone  (ROCEPHIN ) 2 g in sodium chloride  0.9 % 100 mL IVPB        2 g 200 mL/hr over 30 Minutes Intravenous Every 24 hours 10/03/23 2154 10/08/23 0854   10/03/23 2015  cefTRIAXone  (ROCEPHIN ) 1 g in sodium chloride  0.9 % 100 mL IVPB        1 g 200 mL/hr over 30 Minutes Intravenous  Once 10/03/23 2010 10/03/23 2212       Subjective: Continued c/o itching, nausea, pain   Objective: Vitals:   10/11/23 0025 10/11/23 0354 10/11/23 0829 10/11/23 1120  BP: (!) 97/57 (!) 92/50 92/64 (!) 97/58  Pulse: 85 80 88 93  Resp: 18 17 18 18   Temp: 98.3 F (36.8 C) 98.6 F (37 C) 99.3 F (37.4 C) 98.7 F (37.1 C)  TempSrc: Oral  Oral Oral  SpO2: 97% 95% 100% 99%  Weight:      Height:        Intake/Output Summary (Last 24 hours) at 10/11/2023 1346 Last data filed at  10/11/2023 0600 Gross per 24 hour  Intake 100 ml  Output 300 ml  Net -200 ml   Filed Weights   10/05/23 0500 10/08/23 0500 10/09/23 0500  Weight: 63.2 kg 61.7 kg 41.9 kg    Examination:  General: No acute distress. Cardiovascular: RRR Lungs: unlabored Abdomen:pain to light touch - distended  Neurological: Alert and oriented 3. Moves all extremities 4 with equal strength. Cranial nerves II through XII grossly intact. Skin: Warm and dry. No rashes or lesions. Extremities: R>L LE edema    Data Reviewed: I have personally reviewed following labs and imaging studies  CBC: Recent Labs  Lab 10/06/23 0245 10/07/23 0222 10/08/23 0448 10/10/23 0540 10/11/23 0407  WBC 3.3* 4.3 3.3* 3.8* 5.6  NEUTROABS  --   --   --   --  3.1  HGB 9.2* 8.8* 7.9* 7.9* 7.9*  HCT 27.5* 26.0* 23.4* 23.3* 23.7*  MCV 92.3 93.5 95.5 96.3 96.7  PLT 58* 51* 44* 62* 81*    Basic Metabolic Panel: Recent Labs  Lab 10/04/23 1904 10/05/23 0351 10/06/23 0245 10/07/23 0222  10/08/23 0448 10/10/23 0540 10/10/23 1600 10/11/23 0407  NA  --    < > 124* 125* 125* 125* 125* 126*  K  --    < > 3.6 3.7 4.1 2.8* 2.9* 4.7  CL  --    < > 91* 95* 96* 93* 93* 96*  CO2  --    < > 26 24 23 25 25 25   GLUCOSE  --    < > 149* 111* 78 127* 120* 89  BUN  --    < > <5* <5* <5* <5* <5* <5*  CREATININE  --    < > 0.55 0.49 0.53 0.48 0.44 0.41*  CALCIUM   --    < > 7.4* 7.3* 7.6* 7.6* 7.5* 8.0*  MG 2.1  --  1.5*  --  1.6*  --   --  1.6*  PHOS  --    < > 2.6 2.5 2.5 2.1*  --  1.8*   < > = values in this interval not displayed.    GFR: Estimated Creatinine Clearance: 53.2 mL/min (Gabino Hagin) (by C-G formula based on SCr of 0.41 mg/dL (L)).  Liver Function Tests: Recent Labs  Lab 10/05/23 0351 10/06/23 0245 10/06/23 2045 10/07/23 0222 10/08/23 0448 10/10/23 1600 10/11/23 0407  AST 103*  --  70*  --   --   --  87*  ALT 28  --  24  --   --   --  24  ALKPHOS 56  --  45  --   --   --  27*  BILITOT 4.3*  --  3.8*  --   --    --  12.0*  PROT 5.6*  --  4.5*  --   --   --  5.0*  ALBUMIN  <1.5*   < > <1.5* <1.5* 2.2* 2.1* 2.6*   < > = values in this interval not displayed.    CBG: Recent Labs  Lab 10/10/23 1940 10/10/23 2309 10/11/23 0329 10/11/23 0907 10/11/23 1121  GLUCAP 123* 113* 91 74 82     Recent Results (from the past 240 hours)  Resp panel by RT-PCR (RSV, Flu Kylen Ismael&B, Covid) Anterior Nasal Swab     Status: None   Collection Time: 10/03/23  3:25 PM   Specimen: Anterior Nasal Swab  Result Value Ref Range Status   SARS Coronavirus 2 by RT PCR NEGATIVE NEGATIVE Final    Comment: (NOTE) SARS-CoV-2 target nucleic acids are NOT DETECTED.  The SARS-CoV-2 RNA is generally detectable in upper respiratory specimens during the acute phase of infection. The lowest concentration of SARS-CoV-2 viral copies this assay can detect is 138 copies/mL. Bret Vanessen negative result does not preclude SARS-Cov-2 infection and should not be used as the sole basis for treatment or other patient management decisions. Zennie Ayars negative result may occur with  improper specimen collection/handling, submission of specimen other than nasopharyngeal swab, presence of viral mutation(s) within the areas targeted by this assay, and inadequate number of viral copies(<138 copies/mL). Eulene Pekar negative result must be combined with clinical observations, patient history, and epidemiological information. The expected result is Negative.  Fact Sheet for Patients:  BloggerCourse.com  Fact Sheet for Healthcare Providers:  SeriousBroker.it  This test is no t yet approved or cleared by the [place]  FDA and  has been authorized for detection and/or diagnosis of SARS-CoV-2 by FDA under an Emergency Use Authorization (EUA). This EUA will remain  in effect (meaning this test can be used) for the duration of the COVID-19 declaration  under Section 564(b)(1) of the Act, 21 U.S.C.section 360bbb-3(b)(1), unless  the authorization is terminated  or revoked sooner.       Influenza Geriann Lafont by PCR NEGATIVE NEGATIVE Final   Influenza B by PCR NEGATIVE NEGATIVE Final    Comment: (NOTE) The Xpert Xpress SARS-CoV-2/FLU/RSV plus assay is intended as an aid in the diagnosis of influenza from Nasopharyngeal swab specimens and should not be used as Sivan Cuello sole basis for treatment. Nasal washings and aspirates are unacceptable for Xpert Xpress SARS-CoV-2/FLU/RSV testing.  Fact Sheet for Patients: BloggerCourse.com  Fact Sheet for Healthcare Providers: SeriousBroker.it  This test is not yet approved or cleared by the [place]  FDA and has been authorized for detection and/or diagnosis of SARS-CoV-2 by FDA under an Emergency Use Authorization (EUA). This EUA will remain in effect (meaning this test can be used) for the duration of the COVID-19 declaration under Section 564(b)(1) of the Act, 21 U.S.C. section 360bbb-3(b)(1), unless the authorization is terminated or revoked.     Resp Syncytial Virus by PCR NEGATIVE NEGATIVE Final    Comment: (NOTE) Fact Sheet for Patients: BloggerCourse.com  Fact Sheet for Healthcare Providers: SeriousBroker.it  This test is not yet approved or cleared by the [place]  FDA and has been authorized for detection and/or diagnosis of SARS-CoV-2 by FDA under an Emergency Use Authorization (EUA). This EUA will remain in effect (meaning this test can be used) for the duration of the COVID-19 declaration under Section 564(b)(1) of the Act, 21 U.S.C. section 360bbb-3(b)(1), unless the authorization is terminated or revoked.  Performed at [facility], [address], [place], [name] [address]   MRSA Next Gen by PCR, Nasal     Status: None   Collection Time: 10/03/23 10:20 PM   Specimen: Nasal Mucosa; Nasal Swab  Result Value Ref Range Status   MRSA by PCR Next Gen NOT  DETECTED NOT DETECTED Final    Comment: (NOTE) The GeneXpert MRSA Assay (FDA approved for NASAL specimens only), is one component of Mckinze Poirier comprehensive MRSA colonization surveillance program. It is not intended to diagnose MRSA infection nor to guide or monitor treatment for MRSA infections. Test performance is not FDA approved in patients less than 27 years old. Performed at [facility], [address] [place], [name] [address]   Peritoneal fluid culture w Gram Stain     Status: None   Collection Time: 10/04/23  6:23 PM   Specimen: Peritoneal Washings; Peritoneal Fluid  Result Value Ref Range Status   Specimen Description PERITONEAL  Final   Special Requests NONE  Final   Gram Stain   Final    WBC PRESENT,BOTH PMN AND MONONUCLEAR NO ORGANISMS SEEN CYTOSPIN SMEAR    Culture   Final    NO GROWTH 3 DAYS Performed at [facility], [address], [place], [name] [address]    Report Status 10/08/2023 FINAL  Final         Radiology Studies: ECHOCARDIOGRAM COMPLETE Result Date: 10/11/2023    ECHOCARDIOGRAM REPORT   Patient Name:   Joan Wood Date of Exam: 10/11/2023 Medical Rec #:  [id number]      Height:       62.0 in Accession #:    [id number]     Weight:       92.4 lb Date of Birth:  [birth year]       BSA:          1.377 m Patient Age:    64 years  BP:           92/50 mmHg Patient Gender: F              HR:           92 bpm. Exam Location:  Inpatient Procedure: 2D Echo (Both Spectral and Color Flow Doppler were utilized during            procedure). Indications:    Other abnormalities of the heart  History:        Patient has no prior history of Echocardiogram examinations.                 Other abnormalities of the heart.  Sonographer:    Norleen Amour Referring Phys: [phone] Willett Lefeber CALDWELL POWELL JR IMPRESSIONS  1. Left ventricular ejection fraction, by estimation, is 70 to 75%. The left ventricle has hyperdynamic function. The left ventricle has no regional wall motion  abnormalities. Left ventricular diastolic parameters were normal.  2. Right ventricular systolic function is normal. The right ventricular size is normal. Tricuspid regurgitation signal is inadequate for assessing PA pressure.  3. The mitral valve is grossly normal. Trivial mitral valve regurgitation.  4. The aortic valve is tricuspid. Aortic valve regurgitation is not visualized.  5. The inferior vena cava is normal in size with greater than 50% respiratory variability, suggesting right atrial pressure of 3 mmHg. Comparison(s): No prior Echocardiogram. FINDINGS  Left Ventricle: Left ventricular ejection fraction, by estimation, is 70 to 75%. The left ventricle has hyperdynamic function. The left ventricle has no regional wall motion abnormalities. The left ventricular internal cavity size was normal in size. There is no left ventricular hypertrophy. Left ventricular diastolic parameters were normal. Right Ventricle: The right ventricular size is normal. No increase in right ventricular wall thickness. Right ventricular systolic function is normal. Tricuspid regurgitation signal is inadequate for assessing PA pressure. Left Atrium: Left atrial size was normal in size. Right Atrium: Right atrial size was normal in size. Pericardium: There is no evidence of pericardial effusion. Mitral Valve: The mitral valve is grossly normal. Trivial mitral valve regurgitation. Tricuspid Valve: The tricuspid valve is not well visualized. Tricuspid valve regurgitation is not demonstrated. Aortic Valve: The aortic valve is tricuspid. Aortic valve regurgitation is not visualized. Pulmonic Valve: The pulmonic valve was normal in structure. Pulmonic valve regurgitation is not visualized. Aorta: The aortic root and ascending aorta are structurally normal, with no evidence of dilitation. Venous: The inferior vena cava is normal in size with greater than 50% respiratory variability, suggesting right atrial pressure of 3 mmHg. IAS/Shunts: No  atrial level shunt detected by color flow Doppler.  LEFT VENTRICLE PLAX 2D LVIDd:         4.30 cm     Diastology LVIDs:         2.00 cm     LV e' medial:    10.80 cm/s LV PW:         0.90 cm     LV E/e' medial:  8.4 LV IVS:        0.70 cm     LV e' lateral:   12.20 cm/s LVOT diam:     2.20 cm     LV E/e' lateral: 7.4 LV SV:         81 LV SV Index:   59 LVOT Area:     3.80 cm  LV Volumes (MOD) LV vol d, MOD A2C: 58.0 ml LV vol d, MOD A4C: 78.2 ml LV vol s,  MOD A2C: 12.6 ml LV vol s, MOD A4C: 20.2 ml LV SV MOD A2C:     45.4 ml LV SV MOD A4C:     78.2 ml LV SV MOD BP:      54.8 ml RIGHT VENTRICLE             IVC RV Basal diam:  2.50 cm     IVC diam: 0.90 cm RV S prime:     11.30 cm/s TAPSE (M-mode): 1.9 cm LEFT ATRIUM             Index        RIGHT ATRIUM          Index LA diam:        3.10 cm 2.25 cm/m   RA Area:     9.91 cm LA Vol (A2C):   16.5 ml 11.99 ml/m  RA Volume:   18.40 ml 13.37 ml/m LA Vol (A4C):   31.4 ml 22.81 ml/m LA Biplane Vol: 23.9 ml 17.36 ml/m  AORTIC VALVE             PULMONIC VALVE LVOT Vmax:   103.00 cm/s PV Vmax:       0.84 m/s LVOT Vmean:  73.800 cm/s PV Peak grad:  2.8 mmHg LVOT VTI:    0.213 m  AORTA Ao Root diam: 3.10 cm Ao Asc diam:  2.50 cm MITRAL VALVE MV Area (PHT): 3.34 cm    SHUNTS MV Decel Time: 227 msec    Systemic VTI:  0.21 m MV E velocity: 90.70 cm/s  Systemic Diam: 2.20 cm MV Jalisa Sacco velocity: 74.40 cm/s MV E/Crayton Savarese ratio:  1.22 Vinie Maxcy MD Electronically signed by Vinie Maxcy MD Signature Date/Time: 10/11/2023/11:26:23 AM    Final    IR ABDOMEN US  LIMITED Result Date: 10/10/2023 CLINICAL DATA:  Patient with history of decompensated alcoholic liver cirrhosis, ascites. Request for paracentesis. EXAM: LIMITED ABDOMEN ULTRASOUND FOR ASCITES TECHNIQUE: Limited ultrasound survey for ascites was performed in all four abdominal quadrants. COMPARISON:  None Available. FINDINGS: No evidence of ascites in all four abdominal quadrants. IMPRESSION: No ascites amendable to paracentesis.   No procedure performed. Performed by: Kristi Davenport, NP Electronically Signed   By: Wilkie Lent M.D.   On: 10/10/2023 15:27        Scheduled Meds:  calcium  carbonate  1 tablet Oral TID WC   feeding supplement  237 mL Oral BID BM   folic acid   1 mg Oral Daily   lidocaine   2 patch Transdermal Q24H   midodrine   15 mg Oral Q8H   multivitamin with minerals  1 tablet Oral Daily   nicotine   21 mg Transdermal Daily   pantoprazole   40 mg Oral BID   sucralfate   1 g Oral Q6H   thiamine   100 mg Oral Daily   Continuous Infusions:  albumin  human 25 g (10/11/23 0949)     LOS: 8 days    Time spent: over 30 min     Meliton Monte, MD [facility]   To contact the attending provider between 7A-7P or the covering provider during after hours 7P-7A, please log into the web site [email or web address] and access using universal [place] password for that web site. If you do not have the password, please call the hospital operator.  10/11/2023, 1:46 PM

## 2023-10-11 NOTE — Progress Notes (Signed)
 Initial Nutrition Assessment  DOCUMENTATION CODES:  Not applicable  INTERVENTION:  Adjust diet order to DYS3 Encourage PO intake Continue ordering assistance Continue Ensure Plus High Protein po BID, each supplement provides 350 kcal and 20 grams of protein. MVI with minerals daily, thiamine  and folic acid  for alcohol use history Magic cup TID with meals, each supplement provides 290 kcal and 9 grams of protein Standing weight  NUTRITION DIAGNOSIS:  Increased nutrient needs related to chronic illness as evidenced by estimated needs.  GOAL:  Patient will meet greater than or equal to 90% of their needs  MONITOR:  PO intake, Supplement acceptance, Labs, I & O's  REASON FOR ASSESSMENT:  Consult (low BMI) Assessment of nutrition requirement/status  ASSESSMENT:  Pt with hx of cirrhosis, perforated gastric ulcers in July s/p repair, recent GIB, h/o pancreatitis, alcohol use disorder, and GERD presented to [facility] ED with N/V/D and poor PO intake. Workup concerning for GI bleed  9/24 - presented to [facility] ED 9/25 - EGD showed necrotic appearing severe esophagitis in the mid and distal esophagus. Severe portal gastropathy. One 2 cm gastric ulcer and multiple superficial duodenal ulcers with clean bases, transferred to [facility] MICU, paracentesis removed  Attempted to see pt on 10/1 and was out of room in radiology for attempted paracentesis. Being assisted to commode by RN this afternoon at the time of visit.     Reviewed chart and weights variable. Needs a standing weight or to have bed weight zeroed for accuracy.   Noted that pt requested a soft diet - likely meant soft food due to her esophagitis, current diet is GI soft which restricts fiber. Will adjust to easy to consume foods with DYS3. Intake appears fair, but limited meal intake recorded.   Will follow-up as able for interview and physical exam  Admit weight: 56.7 kg Current weight: 41.9 kg    Average Meal  Intake: 9/27-9/29: 65% intake x 3 recorded meals  Nutritionally Relevant Medications: Scheduled Meds:  calcium  carbonate  1 tablet Oral TID WC   Ensure Plus High Protein  237 mL Oral BID BM   folic acid   1 mg Oral Daily   multivitamin with minerals  1 tablet Oral Daily   pantoprazole   40 mg Oral BID   sucralfate   1 g Oral Q6H   thiamine   100 mg Oral Daily   Continuous Infusions:  albumin  human 25 g (10/11/23 0949)   promethazine  (PHENERGAN ) injection (IM or IVPB)     PRN Meds: alum & mag hydroxide-simeth, diphenhydrAMINE , phenol, promethazine , simethicone   Labs Reviewed: Sodium 126, chloride 96 Phosphorus 1.8 Magnesium  1.6 CBG ranges from 74-123 mg/dL over the last 24 hours HgbA1c 4.5% (07/30/23)  NUTRITION - FOCUSED PHYSICAL EXAM: Defer to in-person assessment  Diet Order:   Diet Order             DIET SOFT Room service appropriate? Yes; Fluid consistency: Thin  Diet effective now                   EDUCATION NEEDS:  Not appropriate for education at this time  Skin:  Skin Assessment: Reviewed RN Assessment  Last BM:  10/2 - type 5  Height:  Ht Readings from Last 1 Encounters:  10/04/23 5' 2 (1.575 m)    Weight:  Wt Readings from Last 1 Encounters:  10/09/23 41.9 kg    Ideal Body Weight:  50 kg  BMI:  Body mass index is 16.9 kg/m.  Estimated  Nutritional Needs:  Kcal:  1600-1800 kcal/d Protein:  85-100g/d Fluid:  1.5L/d    Joan Wood, RD, LDN, CNSC Registered Dietitian II Please reach out via secure chat

## 2023-10-12 ENCOUNTER — Inpatient Hospital Stay (HOSPITAL_COMMUNITY)

## 2023-10-12 DIAGNOSIS — K7031 Alcoholic cirrhosis of liver with ascites: Secondary | ICD-10-CM | POA: Diagnosis not present

## 2023-10-12 DIAGNOSIS — R609 Edema, unspecified: Secondary | ICD-10-CM

## 2023-10-12 DIAGNOSIS — D62 Acute posthemorrhagic anemia: Secondary | ICD-10-CM | POA: Diagnosis not present

## 2023-10-12 DIAGNOSIS — F1090 Alcohol use, unspecified, uncomplicated: Secondary | ICD-10-CM | POA: Diagnosis not present

## 2023-10-12 DIAGNOSIS — K922 Gastrointestinal hemorrhage, unspecified: Secondary | ICD-10-CM | POA: Diagnosis not present

## 2023-10-12 LAB — GLUCOSE, CAPILLARY
Glucose-Capillary: 101 mg/dL — ABNORMAL HIGH (ref 70–99)
Glucose-Capillary: 105 mg/dL — ABNORMAL HIGH (ref 70–99)
Glucose-Capillary: 122 mg/dL — ABNORMAL HIGH (ref 70–99)
Glucose-Capillary: 57 mg/dL — ABNORMAL LOW (ref 70–99)
Glucose-Capillary: 71 mg/dL (ref 70–99)
Glucose-Capillary: 78 mg/dL (ref 70–99)
Glucose-Capillary: 92 mg/dL (ref 70–99)

## 2023-10-12 LAB — CBC WITH DIFFERENTIAL/PLATELET
Abs Immature Granulocytes: 0.04 K/uL (ref 0.00–0.07)
Basophils Absolute: 0 K/uL (ref 0.0–0.1)
Basophils Relative: 1 %
Eosinophils Absolute: 0 K/uL (ref 0.0–0.5)
Eosinophils Relative: 1 %
HCT: 22.3 % — ABNORMAL LOW (ref 36.0–46.0)
Hemoglobin: 7.5 g/dL — ABNORMAL LOW (ref 12.0–15.0)
Immature Granulocytes: 1 %
Lymphocytes Relative: 18 %
Lymphs Abs: 1 K/uL (ref 0.7–4.0)
MCH: 33.2 pg (ref 26.0–34.0)
MCHC: 33.6 g/dL (ref 30.0–36.0)
MCV: 98.7 fL (ref 80.0–100.0)
Monocytes Absolute: 1.3 K/uL — ABNORMAL HIGH (ref 0.1–1.0)
Monocytes Relative: 23 %
Neutro Abs: 3.1 K/uL (ref 1.7–7.7)
Neutrophils Relative %: 56 %
Platelets: 86 K/uL — ABNORMAL LOW (ref 150–400)
RBC: 2.26 MIL/uL — ABNORMAL LOW (ref 3.87–5.11)
RDW: 23.4 % — ABNORMAL HIGH (ref 11.5–15.5)
Smear Review: NORMAL
WBC: 5.5 K/uL (ref 4.0–10.5)
nRBC: 0 % (ref 0.0–0.2)

## 2023-10-12 LAB — BPAM RBC
Blood Product Expiration Date: 202510312359
Unit Type and Rh: 6200

## 2023-10-12 LAB — PROTIME-INR
INR: 3.1 — ABNORMAL HIGH (ref 0.8–1.2)
Prothrombin Time: 33.5 s — ABNORMAL HIGH (ref 11.4–15.2)

## 2023-10-12 LAB — COMPREHENSIVE METABOLIC PANEL WITH GFR
ALT: 23 U/L (ref 0–44)
AST: 78 U/L — ABNORMAL HIGH (ref 15–41)
Albumin: 2.8 g/dL — ABNORMAL LOW (ref 3.5–5.0)
Alkaline Phosphatase: 23 U/L — ABNORMAL LOW (ref 38–126)
Anion gap: 9 (ref 5–15)
BUN: <5 mg/dL — ABNORMAL LOW (ref 6–20)
CO2: 21 mmol/L — ABNORMAL LOW (ref 22–32)
Calcium: 7.9 mg/dL — ABNORMAL LOW (ref 8.9–10.3)
Chloride: 98 mmol/L (ref 98–111)
Creatinine, Ser: 0.4 mg/dL — ABNORMAL LOW (ref 0.44–1.00)
GFR, Estimated: >60 mL/min (ref >60)
Glucose, Bld: 99 mg/dL (ref 70–99)
Potassium: 3.9 mmol/L (ref 3.5–5.1)
Sodium: 128 mmol/L — ABNORMAL LOW (ref 135–145)
Total Bilirubin: 13 mg/dL — ABNORMAL HIGH (ref 0.0–1.2)
Total Protein: 5 g/dL — ABNORMAL LOW (ref 6.5–8.1)

## 2023-10-12 LAB — PREPARE RBC (CROSSMATCH)

## 2023-10-12 LAB — TYPE AND SCREEN
ABO/RH(D): A POS
Antibody Screen: NEGATIVE
Unit division: 0

## 2023-10-12 LAB — PHOSPHORUS: Phosphorus: 5 mg/dL — ABNORMAL HIGH (ref 2.5–4.6)

## 2023-10-12 LAB — MAGNESIUM: Magnesium: 2 mg/dL (ref 1.7–2.4)

## 2023-10-12 MED ORDER — METRONIDAZOLE 500 MG PO TABS
500.0000 mg | ORAL_TABLET | Freq: Two times a day (BID) | ORAL | Status: AC
  Administered 2023-10-12 – 2023-10-22 (×22): 500 mg via ORAL
  Filled 2023-10-12 (×22): qty 1

## 2023-10-12 MED ORDER — SODIUM CHLORIDE 0.9% IV SOLUTION: Freq: Once | INTRAVENOUS | Status: AC

## 2023-10-12 MED ORDER — FUROSEMIDE 10 MG/ML IJ SOLN
20.0000 mg | Freq: Once | INTRAMUSCULAR | Status: AC
  Administered 2023-10-13: 20 mg via INTRAVENOUS
  Filled 2023-10-12: qty 4

## 2023-10-12 MED ORDER — SODIUM CHLORIDE 0.9 % IV SOLN
2.0000 g | INTRAVENOUS | Status: AC
  Administered 2023-10-12 – 2023-10-22 (×11): 2 g via INTRAVENOUS
  Filled 2023-10-12 (×11): qty 20

## 2023-10-12 MED ORDER — SPIRONOLACTONE 25 MG PO TABS
25.0000 mg | ORAL_TABLET | Freq: Every day | ORAL | Status: DC
Start: 2023-10-12 — End: 2023-10-12

## 2023-10-12 MED ORDER — MIDODRINE HCL 5 MG PO TABS
15.0000 mg | ORAL_TABLET | Freq: Three times a day (TID) | ORAL | Status: DC
  Administered 2023-10-12 – 2023-11-02 (×64): 15 mg via ORAL
  Filled 2023-10-12 (×64): qty 3

## 2023-10-12 MED ORDER — ACETYLCYSTEINE LOAD VIA INFUSION
150.0000 mg/kg | Freq: Once | INTRAVENOUS | Status: AC
  Administered 2023-10-12: 10455 mg via INTRAVENOUS
  Filled 2023-10-12: qty 343

## 2023-10-12 MED ORDER — ALBUMIN HUMAN 25 % IV SOLN
25.0000 g | Freq: Once | INTRAVENOUS | Status: AC
  Administered 2023-10-12: 25 g via INTRAVENOUS
  Filled 2023-10-12: qty 100

## 2023-10-12 MED ORDER — SPIRONOLACTONE 25 MG PO TABS
25.0000 mg | ORAL_TABLET | Freq: Every day | ORAL | Status: DC
  Administered 2023-10-12 – 2023-10-13 (×2): 25 mg via ORAL
  Filled 2023-10-12 (×2): qty 1

## 2023-10-12 MED ORDER — DEXTROSE 5 % IV SOLN
12.5000 mg/kg/h | INTRAVENOUS | Status: AC
  Filled 2023-10-12: qty 90

## 2023-10-12 MED ORDER — FUROSEMIDE 10 MG/ML IJ SOLN: 20.0000 mg | Freq: Once | INTRAMUSCULAR | Status: DC

## 2023-10-12 MED ORDER — DEXTROSE 5 % IV SOLN
6.2500 mg/kg/h | INTRAVENOUS | Status: AC
Start: 2023-10-12 — End: 2023-10-15
  Administered 2023-10-13: 6.25 mg/kg/h via INTRAVENOUS
  Filled 2023-10-12 (×3): qty 90

## 2023-10-12 NOTE — Plan of Care (Signed)
 Pt has low Bloop pressure, informed MD Franky, meds were given as ordered. Problem: Health Behavior/Discharge Planning: Goal: Ability to manage health-related needs will improve Outcome: Progressing   Problem: Clinical Measurements: Goal: Will remain free from infection Outcome: Progressing   Problem: Activity: Goal: Risk for activity intolerance will decrease Outcome: Progressing   Problem: Nutrition: Goal: Adequate nutrition will be maintained Outcome: Progressing   Problem: Coping: Goal: Level of anxiety will decrease Outcome: Progressing   Problem: Elimination: Goal: Will not experience complications related to bowel motility Outcome: Progressing   Problem: Elimination: Goal: Will not experience complications related to urinary retention Outcome: Progressing   Problem: Pain Managment: Goal: General experience of comfort will improve and/or be controlled Outcome: Progressing   Problem: Safety: Goal: Ability to remain free from injury will improve Outcome: Progressing   Problem: Skin Integrity: Goal: Risk for impaired skin integrity will decrease Outcome: Progressing

## 2023-10-12 NOTE — Progress Notes (Signed)
 PROGRESS NOTE    Joan Wood  FMW:[id number] DOB: [birth year] DOA: 10/03/2023 PCP: Edman Meade PEDLAR, FNP  Chief Complaint  Patient presents with   Emesis   Nausea   Diarrhea    Brief Narrative:   Joan Wood is Joan Wood 53 year old female with decompensated cirrhosis, PUD with recent gastric ulcer perforation s/p ex-lap, GERD, pancreatitis, OSA presented to [facility] with N/V/bloody diarrhea x 4 days. In the ED had coffee ground emesis. CTA GI neg for acute bleed, large ascites and new distal esophageal thickening. GI consulted for acute blood loss anemia with Hg 8.3>7. She was given PPI, started on octreotide  gtt, rocephin , vitamin K , pain and nausea control and admitted to hospitalist. GI was consulted to evaluate 9/25 AM. Hgb with drop from 8.3 >7 and 1 U PRBC ordered. GI saw patient and proceeded with upper endoscopy showing black discoloration of esophageal mucosa concerning for acute esophageal necrosis, portal hypertension, non-bleeding gastric and duodenal ulcers. Transferred to [facility] ICU for further management. She had paracentesis 10/04/23 with removed. GI following, continue PPI, octreotide , started clears, Hb remained stable transferred to [facility] service 10/06/23   Assessment & Plan:   Principal Problem:   GI bleed Active Problems:   Tobacco abuse   Alcohol use disorder   Hyponatremia   Nausea vomiting and diarrhea   Acute on chronic blood loss anemia   Liver cirrhosis (HCC)   Esophageal necrosis   Decompensated cirrhosis (HCC)   ABLA (acute blood loss anemia)  Acute upper GI bleed Acute esophageal necrosis Acute blood loss anemia Patient has h/o PUD, gastric ulcer perforation s/p sx lap 07/2023. Now s/p EGD showing grade D esophagitis, black discoloration mucosa in esophagus concerning for acute esophageal necrosis, portal hypertensive gastropathy, non bleeding gastric ulcer with clean ulcer base with apparent sutures.  No bleeding duodenal ulcers with Joan Wood clean ulcer base.  No  evidence of active bleeding or varices.   Transferred to [facility] given findings concerning for acute esophageal necrosis Gastrin level wnl  GI has now signed off, recommending carafate  x14 days, BID PPI until follow up with GI primary at [facility].  Needs ensure 2-3 times daily.  Currently on soft diet.     Decompensated alcoholic liver cirrhosis Volume Overload  Portal gastropathy Hyperbilirubinemia Ascites  Bilateral Pleural Effusions  Hypoalbuminemia  Thrombocytopenia  s/p paracentesis 9/25 3.3L removed. (SBP ruled out) S/p 5 days ceftriaxone   Repeat US  10/1 without sufficient ascites to tap She's volume overloaded, but diuresis limited by hypotension - started midodrine  and albumin , gentle lasix/spironolactone oral (if tolerated) - follow  Echo with preserved EF INR and bili progressively worsening  Meld 10/2 is 32, 52.6% 3 month mortality    Orthostatic Hypotension Transfuse 1 unit pRBC, will follow response  SBP dropped to 75 when standing  Abdominal Pain CT with findings concerning for appendicitis.  Distal esophagus wall thickening, concerning for esophagitis.  Gastric antral wall thickening (gastritis or underlying ulceration).  Bilateral R/L effusions.  Hepatomegaly and cirrhosis with portal hypertension.  Manage symptomatically   Appendicitis Appreciate gen surgery, recommending abx -ok for diet she's high risk for surgery in setting of her cirrhosis  Right Lower Extremity Edema Follow LE US   More tender as well, consider cellulitis, but not particularly erythematous and normal white count - will consider additional imaging based on US  findings  Hypervolemic Hyponatremia Gradually improving Adding on oral lasix/spironolactone    Hypomagnesemia  Hypophosphatemia Follow, replace and follow    Tobacco abuse Continue nicotine  patch.  Alcohol abuse CIWA protocol. Continue thiamine , folate, MVT.  Severe Protein Calorie Malnutrition Body mass index is 28.1  kg/m. RD    DVT prophylaxis: SCD Code Status: full Family Communication: none Disposition:   Status is: Inpatient Remains inpatient appropriate because: need for continued inpatient care   Consultants:  GI  Procedures:   Upper endoscopy - grade D esophagitis, black discoloration mucosa in esophagus concerning for acute esophageal necrosis, portal hypertensive gastropathy, nonbleeding gastric ulcer with clean base with apparent sutures, non bleeding duodenal ulcers with clean base  Echo IMPRESSIONS     1. Left ventricular ejection fraction, by estimation, is 70 to 75%. The  left ventricle has hyperdynamic function. The left ventricle has no  regional wall motion abnormalities. Left ventricular diastolic parameters  were normal.   2. Right ventricular systolic function is normal. The right ventricular  size is normal. Tricuspid regurgitation signal is inadequate for assessing  PA pressure.   3. The mitral valve is grossly normal. Trivial mitral valve  regurgitation.   4. The aortic valve is tricuspid. Aortic valve regurgitation is not  visualized.   5. The inferior vena cava is normal in size with greater than 50%  respiratory variability, suggesting right atrial pressure of 3 mmHg.   Comparison(s): No prior Echocardiogram.   LE US  Summary:  RIGHT:      - There is no evidence of deep vein thrombosis in the lower extremity.    - No cystic structure found in the popliteal fossa.    LEFT:  - No evidence of common femoral vein obstruction.  Antimicrobials:  Anti-infectives (From admission, onward)    Start     Dose/Rate Route Frequency Ordered Stop   10/12/23 1000  metroNIDAZOLE  (FLAGYL ) tablet 500 mg        500 mg Oral Every 12 hours 10/12/23 0734     10/12/23 0830  cefTRIAXone  (ROCEPHIN ) 2 g in sodium chloride  0.9 % 100 mL IVPB        2 g 200 mL/hr over 30 Minutes Intravenous Every 24 hours 10/12/23 0734     10/07/23 1000  fluconazole  (DIFLUCAN ) tablet 100  mg  Status:  Discontinued        100 mg Oral Daily 10/07/23 0853 10/07/23 0855   10/07/23 1000  fluconazole  (DIFLUCAN ) tablet 150 mg        150 mg Oral Daily 10/07/23 0855 10/07/23 1014   10/04/23 1000  cefTRIAXone  (ROCEPHIN ) 2 g in sodium chloride  0.9 % 100 mL IVPB        2 g 200 mL/hr over 30 Minutes Intravenous Every 24 hours 10/03/23 2154 10/08/23 0854   10/03/23 2015  cefTRIAXone  (ROCEPHIN ) 1 g in sodium chloride  0.9 % 100 mL IVPB        1 g 200 mL/hr over 30 Minutes Intravenous  Once 10/03/23 2010 10/03/23 2212       Subjective: Continues to feel poorly   Objective: Vitals:   10/12/23 0500 10/12/23 0636 10/12/23 0729 10/12/23 1231  BP:  (!) 92/59 (!) 94/55 118/76  Pulse:  73 76 90  Resp:  18 16 18   Temp:  98.6 F (37 C) 98.6 F (37 C) 98.6 F (37 C)  TempSrc:  Oral Oral   SpO2:  96% 94% 98%  Weight: 69.7 kg     Height:        Intake/Output Summary (Last 24 hours) at 10/12/2023 1437 Last data filed at 10/12/2023 0430 Gross per 24 hour  Intake 300 ml  Output --  Net 300 ml   Filed Weights   10/08/23 0500 10/09/23 0500 10/12/23 0500  Weight: 61.7 kg 41.9 kg 69.7 kg    Examination:  General: No acute distress. Pain seems out of proportion, in general, both to abdomen and leg, she withdraws from light touch  Cardiovascular: RRR Lungs: unlabored Abdomen: diffuse abdominal TTP - out of proportion to exam Neurological: Alert and oriented 3. Moves all extremities 4 with equal strength. Cranial nerves II through XII grossly intact. Extremities: bilateral LE edema, R>L (mild TTP on exam on R, no significantly different erythema)    Data Reviewed: I have personally reviewed following labs and imaging studies  CBC: Recent Labs  Lab 10/07/23 0222 10/08/23 0448 10/10/23 0540 10/11/23 0407 10/12/23 0151  WBC 4.3 3.3* 3.8* 5.6 5.5  NEUTROABS  --   --   --  3.1 3.1  HGB 8.8* 7.9* 7.9* 7.9* 7.5*  HCT 26.0* 23.4* 23.3* 23.7* 22.3*  MCV 93.5 95.5 96.3 96.7 98.7   PLT 51* 44* 62* 81* 86*    Basic Metabolic Panel: Recent Labs  Lab 10/06/23 0245 10/07/23 0222 10/08/23 0448 10/10/23 0540 10/10/23 1600 10/11/23 0407 10/12/23 0151  NA 124* 125* 125* 125* 125* 126* 128*  K 3.6 3.7 4.1 2.8* 2.9* 4.7 3.9  CL 91* 95* 96* 93* 93* 96* 98  CO2 26 24 23 25 25 25  21*  GLUCOSE 149* 111* 78 127* 120* 89 99  BUN <5* <5* <5* <5* <5* <5* <5*  CREATININE 0.55 0.49 0.53 0.48 0.44 0.41* 0.40*  CALCIUM  7.4* 7.3* 7.6* 7.6* 7.5* 8.0* 7.9*  MG 1.5*  --  1.6*  --   --  1.6* 2.0  PHOS 2.6 2.5 2.5 2.1*  --  1.8* 5.0*    GFR: Estimated Creatinine Clearance: 73.5 mL/min (Joan Wood) (by C-G formula based on SCr of 0.4 mg/dL (L)).  Liver Function Tests: Recent Labs  Lab 10/06/23 2045 10/07/23 0222 10/08/23 0448 10/10/23 1600 10/11/23 0407 10/12/23 0151  AST 70*  --   --   --  87* 78*  ALT 24  --   --   --  24 23  ALKPHOS 45  --   --   --  27* 23*  BILITOT 3.8*  --   --   --  12.0* 13.0*  PROT 4.5*  --   --   --  5.0* 5.0*  ALBUMIN  <1.5* <1.5* 2.2* 2.1* 2.6* 2.8*    CBG: Recent Labs  Lab 10/12/23 0013 10/12/23 0400 10/12/23 0730 10/12/23 1233 10/12/23 1409  GLUCAP 101* 92 78 57* 71     Recent Results (from the past 240 hours)  Resp panel by RT-PCR (RSV, Flu Joan Wood, Covid) Anterior Nasal Swab     Status: None   Collection Time: 10/03/23  3:25 PM   Specimen: Anterior Nasal Swab  Result Value Ref Range Status   SARS Coronavirus 2 by RT PCR NEGATIVE NEGATIVE Final    Comment: (NOTE) SARS-CoV-2 target nucleic acids are NOT DETECTED.  The SARS-CoV-2 RNA is generally detectable in upper respiratory specimens during the acute phase of infection. The lowest concentration of SARS-CoV-2 viral copies this assay can detect is 138 copies/mL. Joan Wood negative result does not preclude SARS-Cov-2 infection and should not be used as the sole basis for treatment or other patient management decisions. Joan Wood negative result may occur with  improper specimen collection/handling,  submission of specimen other than nasopharyngeal swab, presence of viral mutation(s) within the areas targeted by this assay, and inadequate  number of viral copies(<138 copies/mL). Joan Wood negative result must be combined with clinical observations, patient history, and epidemiological information. The expected result is Negative.  Fact Sheet for Patients:  BloggerCourse.com  Fact Sheet for Healthcare Providers:  SeriousBroker.it  This test is no t yet approved or cleared by the [place]  FDA and  has been authorized for detection and/or diagnosis of SARS-CoV-2 by FDA under an Emergency Use Authorization (EUA). This EUA will remain  in effect (meaning this test can be used) for the duration of the COVID-19 declaration under Section 564(b)(1) of the Act, 21 U.S.C.section 360bbb-3(b)(1), unless the authorization is terminated  or revoked sooner.       Influenza [place] Fleece by PCR NEGATIVE NEGATIVE Final   Influenza B by PCR NEGATIVE NEGATIVE Final    Comment: (NOTE) The Xpert Xpress SARS-CoV-2/FLU/RSV plus assay is intended as an aid in the diagnosis of influenza from Nasopharyngeal swab specimens and should not be used as Joan Wood sole basis for treatment. Nasal washings and aspirates are unacceptable for Xpert Xpress SARS-CoV-2/FLU/RSV testing.  Fact Sheet for Patients: BloggerCourse.com  Fact Sheet for Healthcare Providers: SeriousBroker.it  This test is not yet approved or cleared by the [place]  FDA and has been authorized for detection and/or diagnosis of SARS-CoV-2 by FDA under an Emergency Use Authorization (EUA). This EUA will remain in effect (meaning this test can be used) for the duration of the COVID-19 declaration under Section 564(b)(1) of the Act, 21 U.S.C. section 360bbb-3(b)(1), unless the authorization is terminated or revoked.     Resp Syncytial Virus by PCR NEGATIVE  NEGATIVE Final    Comment: (NOTE) Fact Sheet for Patients: BloggerCourse.com  Fact Sheet for Healthcare Providers: SeriousBroker.it  This test is not yet approved or cleared by the [place]  FDA and has been authorized for detection and/or diagnosis of SARS-CoV-2 by FDA under an Emergency Use Authorization (EUA). This EUA will remain in effect (meaning this test can be used) for the duration of the COVID-19 declaration under Section 564(b)(1) of the Act, 21 U.S.C. section 360bbb-3(b)(1), unless the authorization is terminated or revoked.  Performed at [facility], [address], [place], [name] [address]   MRSA Next Gen by PCR, Nasal     Status: None   Collection Time: 10/03/23 10:20 PM   Specimen: Nasal Mucosa; Nasal Swab  Result Value Ref Range Status   MRSA by PCR Next Gen NOT DETECTED NOT DETECTED Final    Comment: (NOTE) The GeneXpert MRSA Assay (FDA approved for NASAL specimens only), is one component of Ankit Degregorio comprehensive MRSA colonization surveillance program. It is not intended to diagnose MRSA infection nor to guide or monitor treatment for MRSA infections. Test performance is not FDA approved in patients less than 49 years old. Performed at [facility], [address], [place], [name] [address]   Peritoneal fluid culture w Gram Stain     Status: None   Collection Time: 10/04/23  6:23 PM   Specimen: Peritoneal Washings; Peritoneal Fluid  Result Value Ref Range Status   Specimen Description PERITONEAL  Final   Special Requests NONE  Final   Gram Stain   Final    WBC PRESENT,BOTH PMN AND MONONUCLEAR NO ORGANISMS SEEN CYTOSPIN SMEAR    Culture   Final    NO GROWTH 3 DAYS Performed at [facility], [address], [place], [name] [address]    Report Status 10/08/2023 FINAL  Final         Radiology Studies: VAS US   LOWER EXTREMITY VENOUS (DVT) Result Date: 10/12/2023  Lower Venous DVT Study Patient  Name:  Joan Wood  Date of Exam:   10/12/2023 Medical Rec #: [id number]       Accession #:    [id number] Date of Birth: September 08, 1970        Patient Gender: F Patient Age:   37 years Exam Location:  [facility] Procedure:      VAS US  LOWER EXTREMITY VENOUS (DVT) Referring Phys: Zayra Devito POWELL JR --------------------------------------------------------------------------------  Indications: Perforated gastric ulcer repair 7.27.2025., and Edema. Other Indications: Smoker, alcohol abuse. Comparison Study: No prior exam. Performing Technologist: Edilia Elden Appl  Examination Guidelines: Joan Wood complete evaluation includes B-mode imaging, spectral Doppler, color Doppler, and power Doppler as needed of all accessible portions of each vessel. Bilateral testing is considered an integral part of Latisia Hilaire complete examination. Limited examinations for reoccurring indications may be performed as noted. The reflux portion of the exam is performed with the patient in reverse Trendelenburg.  +---------+---------------+---------+-----------+----------+--------------+ RIGHT    CompressibilityPhasicitySpontaneityPropertiesThrombus Aging +---------+---------------+---------+-----------+----------+--------------+ CFV      Full           Yes      Yes                                 +---------+---------------+---------+-----------+----------+--------------+ SFJ      Full           Yes      Yes                                 +---------+---------------+---------+-----------+----------+--------------+ FV Prox  Full                                                        +---------+---------------+---------+-----------+----------+--------------+ FV Mid   Full                                                        +---------+---------------+---------+-----------+----------+--------------+ FV DistalFull                                                         +---------+---------------+---------+-----------+----------+--------------+ PFV      Full                                                        +---------+---------------+---------+-----------+----------+--------------+ POP      Full           Yes      Yes                                 +---------+---------------+---------+-----------+----------+--------------+ PTV      Full                                                        +---------+---------------+---------+-----------+----------+--------------+  PERO     Full                                                        +---------+---------------+---------+-----------+----------+--------------+   +----+---------------+---------+-----------+----------+--------------+ LEFTCompressibilityPhasicitySpontaneityPropertiesThrombus Aging +----+---------------+---------+-----------+----------+--------------+ CFV Full           Yes      Yes                                 +----+---------------+---------+-----------+----------+--------------+ SFJ Full           Yes      Yes                                 +----+---------------+---------+-----------+----------+--------------+    Summary: RIGHT: - There is no evidence of deep vein thrombosis in the lower extremity.  - No cystic structure found in the popliteal fossa.  LEFT: - No evidence of common femoral vein obstruction.   *See table(s) above for measurements and observations.    Preliminary    CT ABDOMEN PELVIS W CONTRAST Result Date: 10/11/2023 CLINICAL DATA:  Abdominal pain, acute, nonlocalized EXAM: CT ABDOMEN AND PELVIS WITH CONTRAST TECHNIQUE: Multidetector CT imaging of the abdomen and pelvis was performed using the standard protocol following bolus administration of intravenous contrast. RADIATION DOSE REDUCTION: This exam was performed according to the departmental dose-optimization program which includes automated exposure control, adjustment of the mA and/or kV  according to patient size and/or use of iterative reconstruction technique. CONTRAST:  75mL OMNIPAQUE  IOHEXOL  350 MG/ML SOLN COMPARISON:  CT abdomen pelvis 09/29/2023, CT abdomen pelvis 05/11/2023 FINDINGS: Lower chest: Trace bilateral, right greater than left pleural effusions. Bilateral lower lobe passive atelectasis. Visualized distal esophagus demonstrates wall thickening. Hepatobiliary: The liver is enlarged measuring up to 19 cm. Nodular hepatic contour with heterogeneous hepatic parenchyma. No focal liver abnormality. No gallstones, gallbladder wall thickening, or pericholecystic fluid. No biliary dilatation. Pancreas: No focal lesion. Normal pancreatic contour. No surrounding inflammatory changes. No main pancreatic ductal dilatation. Spleen: Normal in size without focal abnormality. Adrenals/Urinary Tract: No adrenal nodule bilaterally. Bilateral kidneys enhance symmetrically. No hydronephrosis. No hydroureter. The urinary bladder is unremarkable. Stomach/Bowel: Question gastric antrum wall thickening. No evidence of bowel wall thickening or dilatation. The appendix is enlarged in caliber measuring up to 14 mm. Associated appendiceal wall thickening. No appendicolith. Vascular/Lymphatic: Recanalized paraumbilical vein. No abdominal aorta or iliac aneurysm. Mild atherosclerotic plaque of the aorta and its branches. No abdominal, pelvic, or inguinal lymphadenopathy. Reproductive: Uterus and bilateral adnexa are unremarkable. Other: Moderate volume intraperitoneal free fluid. No intraperitoneal free gas. No organized fluid collection. Musculoskeletal: No abdominal wall hernia or abnormality. Diffuse subcutaneus soft tissue edema. No suspicious lytic or blastic osseous lesions. No acute displaced fracture. Grade 1 anterolisthesis of L4 on L5. IMPRESSION: 1. Findings suggestive of acute appendicitis.  No appendicolith. 2. Visualized distal esophagus demonstrates wall thickening. Correlate with signs and symptoms  of esophagitis. 3. Question gastric antral wall thickening. Finding may be related to gastritis or underlying ulceration. 4. Trace bilateral, right greater than left pleural effusions. 5. Hepatomegaly and cirrhosis with portal hypertension. No focal liver lesions identified. Please note that liver protocol enhanced MR and CT are the most sensitive tests  for the screening detection of hepatocellular carcinoma in the high risk setting of cirrhosis. Electronically Signed   By: Morgane  Naveau M.D.   On: 10/11/2023 21:04   ECHOCARDIOGRAM COMPLETE Result Date: 10/11/2023    ECHOCARDIOGRAM REPORT   Patient Name:   Joan Wood Date of Exam: 10/11/2023 Medical Rec #:  [id number]      Height:       62.0 in Accession #:    [id number]     Weight:       92.4 lb Date of Birth:  1970-04-06       BSA:          1.377 m Patient Age:    53 years       BP:           92/50 mmHg Patient Gender: F              HR:           92 bpm. Exam Location:  Inpatient Procedure: 2D Echo (Both Spectral and Color Flow Doppler were utilized during            procedure). Indications:    Other abnormalities of the heart  History:        Patient has no prior history of Echocardiogram examinations.                 Other abnormalities of the heart.  Sonographer:    Norleen Amour Referring Phys: [phone] Steele Ledonne CALDWELL POWELL JR IMPRESSIONS  1. Left ventricular ejection fraction, by estimation, is 70 to 75%. The left ventricle has hyperdynamic function. The left ventricle has no regional wall motion abnormalities. Left ventricular diastolic parameters were normal.  2. Right ventricular systolic function is normal. The right ventricular size is normal. Tricuspid regurgitation signal is inadequate for assessing PA pressure.  3. The mitral valve is grossly normal. Trivial mitral valve regurgitation.  4. The aortic valve is tricuspid. Aortic valve regurgitation is not visualized.  5. The inferior vena cava is normal in size with greater than 50% respiratory  variability, suggesting right atrial pressure of 3 mmHg. Comparison(s): No prior Echocardiogram. FINDINGS  Left Ventricle: Left ventricular ejection fraction, by estimation, is 70 to 75%. The left ventricle has hyperdynamic function. The left ventricle has no regional wall motion abnormalities. The left ventricular internal cavity size was normal in size. There is no left ventricular hypertrophy. Left ventricular diastolic parameters were normal. Right Ventricle: The right ventricular size is normal. No increase in right ventricular wall thickness. Right ventricular systolic function is normal. Tricuspid regurgitation signal is inadequate for assessing PA pressure. Left Atrium: Left atrial size was normal in size. Right Atrium: Right atrial size was normal in size. Pericardium: There is no evidence of pericardial effusion. Mitral Valve: The mitral valve is grossly normal. Trivial mitral valve regurgitation. Tricuspid Valve: The tricuspid valve is not well visualized. Tricuspid valve regurgitation is not demonstrated. Aortic Valve: The aortic valve is tricuspid. Aortic valve regurgitation is not visualized. Pulmonic Valve: The pulmonic valve was normal in structure. Pulmonic valve regurgitation is not visualized. Aorta: The aortic root and ascending aorta are structurally normal, with no evidence of dilitation. Venous: The inferior vena cava is normal in size with greater than 50% respiratory variability, suggesting right atrial pressure of 3 mmHg. IAS/Shunts: No atrial level shunt detected by color flow Doppler.  LEFT VENTRICLE PLAX 2D LVIDd:         4.30 cm     Diastology LVIDs:  2.00 cm     LV e' medial:    10.80 cm/s LV PW:         0.90 cm     LV E/e' medial:  8.4 LV IVS:        0.70 cm     LV e' lateral:   12.20 cm/s LVOT diam:     2.20 cm     LV E/e' lateral: 7.4 LV SV:         81 LV SV Index:   59 LVOT Area:     3.80 cm  LV Volumes (MOD) LV vol d, MOD A2C: 58.0 ml LV vol d, MOD A4C: 78.2 ml LV vol s,  MOD A2C: 12.6 ml LV vol s, MOD A4C: 20.2 ml LV SV MOD A2C:     45.4 ml LV SV MOD A4C:     78.2 ml LV SV MOD BP:      54.8 ml RIGHT VENTRICLE             IVC RV Basal diam:  2.50 cm     IVC diam: 0.90 cm RV S prime:     11.30 cm/s TAPSE (M-mode): 1.9 cm LEFT ATRIUM             Index        RIGHT ATRIUM          Index LA diam:        3.10 cm 2.25 cm/m   RA Area:     9.91 cm LA Vol (A2C):   16.5 ml 11.99 ml/m  RA Volume:   18.40 ml 13.37 ml/m LA Vol (A4C):   31.4 ml 22.81 ml/m LA Biplane Vol: 23.9 ml 17.36 ml/m  AORTIC VALVE             PULMONIC VALVE LVOT Vmax:   103.00 cm/s PV Vmax:       0.84 m/s LVOT Vmean:  73.800 cm/s PV Peak grad:  2.8 mmHg LVOT VTI:    0.213 m  AORTA Ao Root diam: 3.10 cm Ao Asc diam:  2.50 cm MITRAL VALVE MV Area (PHT): 3.34 cm    SHUNTS MV Decel Time: 227 msec    Systemic VTI:  0.21 m MV E velocity: 90.70 cm/s  Systemic Diam: 2.20 cm MV Kenae Lindquist velocity: 74.40 cm/s MV E/Sincere Liuzzi ratio:  1.22 Joan Maxcy MD Electronically signed by Joan Maxcy MD Signature Date/Time: 10/11/2023/11:26:23 AM    Final    IR ABDOMEN US  LIMITED Result Date: 10/10/2023 CLINICAL DATA:  Patient with history of decompensated alcoholic liver cirrhosis, ascites. Request for paracentesis. EXAM: LIMITED ABDOMEN ULTRASOUND FOR ASCITES TECHNIQUE: Limited ultrasound survey for ascites was performed in all four abdominal quadrants. COMPARISON:  None Available. FINDINGS: No evidence of ascites in all four abdominal quadrants. IMPRESSION: No ascites amendable to paracentesis.  No procedure performed. Performed by: Kristi Davenport, NP Electronically Signed   By: Wilkie Lent M.D.   On: 10/10/2023 15:27        Scheduled Meds:  sodium chloride    Intravenous Once   bisacodyl   10 mg Rectal Once   calcium  carbonate  1 tablet Oral TID WC   feeding supplement  237 mL Oral BID BM   folic acid   1 mg Oral Daily   furosemide  20 mg Intravenous Once   furosemide  20 mg Oral Daily   gabapentin   100 mg Oral TID    lidocaine   2 patch Transdermal Q24H   metroNIDAZOLE   500 mg Oral  Q12H   midodrine   15 mg Oral Q8H   multivitamin with minerals  1 tablet Oral Daily   nicotine   21 mg Transdermal Daily   pantoprazole   40 mg Oral BID   spironolactone  25 mg Oral Daily   sucralfate   1 g Oral Q6H   thiamine   100 mg Oral Daily   Continuous Infusions:  cefTRIAXone  (ROCEPHIN )  IV 2 g (10/12/23 0951)     LOS: 9 days    Time spent: over 30 min     Meliton Monte, MD [facility]   To contact the attending provider between 7A-7P or the covering provider during after hours 7P-7A, please log into the web site [email or web address] and access using universal [place] password for that web site. If you do not have the password, please call the hospital operator.  10/12/2023, 2:37 PM

## 2023-10-12 NOTE — Progress Notes (Signed)
 Physical Therapy Treatment Patient Details Name: Joan Wood MRN: [id number] DOB: February 19, 1970 Today's Date: 10/12/2023   History of Present Illness 54 y.o. female presents to [facility] on 10/03/2023 with nausea/vomiting and bloody diarrhea. Pt underwent upper EGD on 9/25, concerning for possible acute esophageal necrosis. Pt also underwent paracentesis on 9/25. PMH includes decompensated cirrhosis, PUD, GERD, pancreatitis, OSA.    PT Comments  Pt received in supine and agreeable to session with encouragement. Pt able to perform bed mobility and standing with min A. Pt requires increased time to complete mobility tasks due to weakness and pain. Pt able to take a few steps towards [facility], but is unable to progress gait due to symptomatic orthostatic hypotension with pt reporting significant dizziness in standing. Pt instructed in supine BLE exercises to reduce deconditioning. Pt educated on maintaining upright posture throughout the day for reduced dizziness with position changes. Pt continues to benefit from PT services to progress toward functional mobility goals.    BP sitting EOB 89/55 BP standing 75/49 BP supine at end of session 88/58   If plan is discharge home, recommend the following: A lot of help with walking and/or transfers;A lot of help with bathing/dressing/bathroom;Assistance with cooking/housework;Assist for transportation;Help with stairs or ramp for entrance   Can travel by private vehicle     No  Equipment Recommendations  Hospital bed;BSC/3in1    Recommendations for Other Services       Precautions / Restrictions Precautions Precautions: Fall Recall of Precautions/Restrictions: Impaired Restrictions Weight Bearing Restrictions Per Provider Order: No     Mobility  Bed Mobility Overal bed mobility: Needs Assistance Bed Mobility: Supine to Sit, Sit to Supine     Supine to sit: Min assist, HOB elevated, Used rails Sit to supine: Max assist   General bed  mobility comments: Min A to scoot hips to EOB and max A for BLE elevation back to EOB    Transfers Overall transfer level: Needs assistance Equipment used: Rolling walker (2 wheels) Transfers: Sit to/from Stand, Bed to chair/wheelchair/BSC Sit to Stand: Min assist   Step pivot transfers: Min assist       General transfer comment: STS from EOB x2 with min A for power up. Pt able to take lateral steps towards [facility] with min A for RW management and cues for sequencing    Ambulation/Gait               General Gait Details: unable due to low BP   Stairs             Wheelchair Mobility     Tilt Bed    Modified Rankin (Stroke Patients Only)       Balance Overall balance assessment: Needs assistance Sitting-balance support: No upper extremity supported, Feet supported Sitting balance-Leahy Scale: Fair Sitting balance - Comments: CGA sitting EOB   Standing balance support: Bilateral upper extremity supported, Reliant on assistive device for balance, During functional activity Standing balance-Leahy Scale: Poor Standing balance comment: reliant on RW support                            Communication Communication Communication: No apparent difficulties  Cognition Arousal: Alert Behavior During Therapy: WFL for tasks assessed/performed, Anxious   PT - Cognitive impairments: Awareness, Safety/Judgement, Problem solving, Attention                         Following commands: Impaired Following commands  impaired: Follows one step commands with increased time, Follows multi-step commands inconsistently    Cueing Cueing Techniques: Verbal cues, Visual cues, Tactile cues  Exercises      General Comments General comments (skin integrity, edema, etc.): Pt with symptomatic orthostatic hypotension. BP sitting EOB 89/55; standing 75/49      Pertinent Vitals/Pain Pain Assessment Pain Assessment: Faces Faces Pain Scale: Hurts little more Pain  Location: stomach, posterior shoulders, skin, bottom Pain Descriptors / Indicators: Aching, Discomfort Pain Intervention(s): Monitored during session, Limited activity within patient's tolerance, Repositioned     PT Goals (current goals can now be found in the care plan section) Acute Rehab PT Goals Patient Stated Goal: to return to independence PT Goal Formulation: With patient Time For Goal Achievement: 10/21/23 Progress towards PT goals: Progressing toward goals    Frequency    Min 2X/week       AM-PAC PT 6 Clicks Mobility   Outcome Measure  Help needed turning from your back to your side while in a flat bed without using bedrails?: A Little Help needed moving from lying on your back to sitting on the side of a flat bed without using bedrails?: A Little Help needed moving to and from a bed to a chair (including a wheelchair)?: A Little Help needed standing up from a chair using your arms (e.g., wheelchair or bedside chair)?: A Little Help needed to walk in hospital room?: Total Help needed climbing 3-5 steps with a railing? : Total 6 Click Score: 14    End of Session Equipment Utilized During Treatment: Gait belt Activity Tolerance: Other (comment);Patient tolerated treatment well (limited by low BP) Patient left: with call bell/phone within reach;in bed;with bed alarm set;with nursing/sitter in room Nurse Communication: Mobility status PT Visit Diagnosis: Other abnormalities of gait and mobility (R26.89);Muscle weakness (generalized) (M62.81)     Time: 8672-8590 PT Time Calculation (min) (ACUTE ONLY): 42 min  Charges:    $Therapeutic Activity: 38-52 mins PT General Charges $$ ACUTE PT VISIT: 1 Visit                     Darryle George, PTA Acute Rehabilitation Services Secure Chat Preferred  [phone]    Darryle George 10/12/2023, 2:30 PM

## 2023-10-12 NOTE — Progress Notes (Signed)
 Right lower extremity venous duplex has been completed.  Results can be found in chart review under CV Proc.  10/12/2023 2:38 PM  Kruti Horacek Elden Appl, RVT.

## 2023-10-12 NOTE — Progress Notes (Addendum)
 Daily Progress Note  DOA: 10/03/2023 Hospital Day: 10  Cc: Decompensated cirrhosis, worsening LFTs ;   ASSESSMENT    53 yo female with a PUD, perforated gastric ulcer s/p repair with omental patch July 2025. Transferred from [facility] with UGI bleed and EGD concerning for acute esophageal necrosis along with Grade D esophagitis, portal hypertensive gastropathy, nonbleeding gastric and duodenal ulcers Gastric biopsies in May 2025 negative for H.pylori. Denies NSAID use. Gastrin level is normal.  TODAY:  Improved esophageal symptoms, having some heartburn today but tolerated diet.   Decompensated Etoh cirrhosis with concern for acute on chronic liver failure in setting of acute appendicitis  TODAY:  Bilirubin is 13 (up from 3.8 a week ago).  Worsening coagulopathy with INR of 3.1 (up from 2.0 on 10/06/2023).  No asterixis on exam, mentation seems normal .  Glucose normal.  She is volume overloaded with lower extremity edema.  The ultrasound a couple days ago did not show any ascites her abdomen is very distended and tense.    Principal Problem:   GI bleed Active Problems:   Alcohol use disorder   Hyponatremia   Tobacco abuse   Nausea vomiting and diarrhea   Acute on chronic blood loss anemia   Liver cirrhosis (HCC)   Esophageal necrosis   Decompensated cirrhosis (HCC)   ABLA (acute blood loss anemia)   PLAN   --Continue IV antibiotics --Consult pharmacy for a trial of NAC --Will get a diagnostic paracentesis to exclude SBP.  Her BP is low, I would be concerned about removing too much ascitic fluid right now.  -- Can use Atarax  if needed for pruritus -- Continue low-dose diuretics --Continue IV albumin  and midodrine .  -- Continue 2 gram sodium restricted diet -- Continue to twice daily PPI and Carafate  for esophageal necrosis --Continue Mylanta as needed -- Continue simethicone  as needed for flatulence  Subjective   Complains of pain across upper back.  Also has  pain in both legs R> L and generalized abdominal pain as well.  Also complaining of heartburn and flatulence  Objective   GI Studies:    Recent Labs    10/10/23 0540 10/11/23 0407 10/12/23 0151  WBC 3.8* 5.6 5.5  HGB 7.9* 7.9* 7.5*  HCT 23.3* 23.7* 22.3*  MCV 96.3 96.7 98.7  PLT 62* 81* 86*   No results for input(s): FOLATE, VITAMINB12, FERRITIN, TIBC, IRONPCTSAT in the last 72 hours. Recent Labs    10/10/23 1600 10/11/23 0407 10/12/23 0151  NA 125* 126* 128*  K 2.9* 4.7 3.9  CL 93* 96* 98  CO2 25 25 21*  GLUCOSE 120* 89 99  BUN <5* <5* <5*  CREATININE 0.44 0.41* 0.40*  CALCIUM  7.5* 8.0* 7.9*   Recent Labs    10/10/23 1600 10/11/23 0407 10/12/23 0151  PROT  --  5.0* 5.0*  ALBUMIN  2.1* 2.6* 2.8*  AST  --  87* 78*  ALT  --  24 23  ALKPHOS  --  27* 23*  BILITOT  --  12.0* 13.0*      Imaging:  VAS US  LOWER EXTREMITY VENOUS (DVT)  Lower Venous DVT Study  Patient Name:  Joan Wood  Date of Exam:   10/12/2023 Medical Rec #: [id number]       Accession #:    [id number] Date of Birth: [birth year]        Patient Gender: F Patient Age:   73 years Exam Location:  [facility] Procedure:  VAS US  LOWER EXTREMITY VENOUS (DVT) Referring Phys: A POWELL JR  --------------------------------------------------------------------------------   Indications: Perforated gastric ulcer repair 7.27.2025., and Edema. Other Indications: Smoker, alcohol abuse.  Comparison Study: No prior exam.  Performing Technologist: Edilia Elden Appl    Examination Guidelines: A complete evaluation includes B-mode imaging, spectral Doppler, color Doppler, and power Doppler as needed of all accessible portions of each vessel. Bilateral testing is considered an integral part of a complete examination. Limited examinations for reoccurring indications may be performed as noted. The reflux portion of the exam is performed with the patient in reverse Trendelenburg.      +---------+---------------+---------+-----------+----------+--------------+ RIGHT    CompressibilityPhasicitySpontaneityPropertiesThrombus Aging +---------+---------------+---------+-----------+----------+--------------+ CFV      Full           Yes      Yes                                 +---------+---------------+---------+-----------+----------+--------------+ SFJ      Full           Yes      Yes                                 +---------+---------------+---------+-----------+----------+--------------+ FV Prox  Full                                                        +---------+---------------+---------+-----------+----------+--------------+ FV Mid   Full                                                        +---------+---------------+---------+-----------+----------+--------------+ FV DistalFull                                                        +---------+---------------+---------+-----------+----------+--------------+ PFV      Full                                                        +---------+---------------+---------+-----------+----------+--------------+ POP      Full           Yes      Yes                                 +---------+---------------+---------+-----------+----------+--------------+ PTV      Full                                                        +---------+---------------+---------+-----------+----------+--------------+ PERO     Full                                                        +---------+---------------+---------+-----------+----------+--------------+        +----+---------------+---------+-----------+----------+--------------+  LEFTCompressibilityPhasicitySpontaneityPropertiesThrombus Aging +----+---------------+---------+-----------+----------+--------------+ CFV Full           Yes      Yes                                  +----+---------------+---------+-----------+----------+--------------+ SFJ Full           Yes      Yes                                 +----+---------------+---------+-----------+----------+--------------+          Summary: RIGHT:  - There is no evidence of deep vein thrombosis in the lower extremity.   - No cystic structure found in the popliteal fossa.   LEFT: - No evidence of common femoral vein obstruction.       *See table(s) above for measurements and observations.       Preliminary       Scheduled inpatient medications:   sodium chloride    Intravenous Once   bisacodyl   10 mg Rectal Once   calcium  carbonate  1 tablet Oral TID WC   feeding supplement  237 mL Oral BID BM   folic acid   1 mg Oral Daily   furosemide  20 mg Oral Daily   gabapentin   100 mg Oral TID   lidocaine   2 patch Transdermal Q24H   metroNIDAZOLE   500 mg Oral Q12H   midodrine   15 mg Oral Q8H   multivitamin with minerals  1 tablet Oral Daily   nicotine   21 mg Transdermal Daily   pantoprazole   40 mg Oral BID   spironolactone  25 mg Oral Daily   sucralfate   1 g Oral Q6H   thiamine   100 mg Oral Daily   Continuous inpatient infusions:   cefTRIAXone  (ROCEPHIN )  IV 2 g (10/12/23 0951)   PRN inpatient medications: albuterol , alum & mag hydroxide-simeth, artificial tears, diphenhydrAMINE , Muscle Rub, ondansetron  (ZOFRAN ) IV, mouth rinse, oxyCODONE , phenol, simethicone   Vital signs in last 24 hours: Temp:  [98 F (36.7 C)-98.7 F (37.1 C)] 98.3 F (36.8 C) (10/03 1513) Pulse Rate:  [73-95] 95 (10/03 1513) Resp:  [16-18] 18 (10/03 1513) BP: (81-118)/(51-76) 84/61 (10/03 1513) SpO2:  [94 %-99 %] 99 % (10/03 1513) Weight:  [69.7 kg] 69.7 kg (10/03 0500) Last BM Date : 10/11/23  Intake/Output Summary (Last 24 hours) at 10/12/2023 1650 Last data filed at 10/12/2023 0430 Gross per 24 hour  Intake 300 ml  Output --  Net 300 ml    Intake/Output from previous day: 10/02 0701 - 10/03  0700 In: 300 [P.O.:300] Out: -  Intake/Output this shift: No intake/output data recorded.   Physical Exam:  General: Alert female in NAD Heart:  Regular rate and rhythm.  Pulmonary: Normal respiratory effort Abdomen: Soft, nondistended, generalized tenderness , active bowel sounds . Extremities: Pitting edema of both thighs , right greater than left .  Neurologic: Alert and oriented.  No asterixis Psych: Pleasant. Cooperative     LOS: 9 days   Vina Dasen ,NP 10/12/2023, 4:50 PM     Attending physician's note   I have taken a history, reviewed the chart, and examined the patient. I performed a substantive portion of this encounter, including complete performance of at least one of the key components, in conjunction with the APP. I agree with the APP's note, impression, and recommendations with my  edits.   GI service reconsulted on this patient due to worsening hepatic function.  In review, 53 year old female with decompensated EtOH cirrhosis transferred to [facility] due to acute esophageal necrosis.  Has been overall improving from an esophageal standpoint, but liver function worsening in the last couple of days.  CT yesterday with concern for acute appendicitis without appendicolith.  General Surgery service consulted and recommends medical management given elevated intraoperative risks in this Child C cirrhotic.  In the meantime, T. bili has been uptrending, now 13 in conjunction with uptrending INR, now 3.1.  AST/ALT otherwise largely at baseline with normal ALP at 23.  Has had slow downtrend in H/H, now 7.5/22 and transfuse 1 unit today (baseline hemoglobin ~9-10 PTA).  Otherwise preserved renal function.  Has had 2 paracentesis on this admission, both negative for SBP.  Attempted paracentesis on 10/1, but no drainable pocket.  Exam today with jaundiced complexion, icteric sclera.  No asterixis.  Abdomen distended with caput medusae.  Bilateral LE edema in the thighs.  1)  Decompensated EtOH cirrhosis 2) Acute on chronic liver failure 3) Elevated bilirubin 4) Coagulopathy with elevated INR 5) Thrombocytopenia Concern for worsening hepatic function.  May be related to sepsis given recent downtrending BP, possible appendicitis on CT.  Rising bilirubin may be cholestasis of sepsis given otherwise normal ALP and no appreciable change in AST/ALT.  Nonetheless, her changes concerning. - Continue broad-spectrum ABX - Will trial course of NAC given concern for acute on chronic liver failure - Midodrine  and albumin  - Dose of vitamin K  - Continue low-dose diuretics - Diagnostic paracentesis ordered - Can give Atarax  for itching related to hyperbilirubinemia - Daily CMP, PT/INR, CBC - I did have a frank discussion with the patient today regarding her worsening liver function.  She confirmed that if unable to make her own decisions, she would defer to her friend, [name] Marina  6) Abdominal pain Possible appendicitis on CT.  Evaluated by the General Surgery service and patient is not an appropriate surgical candidate. - Medical management for possible appendicitis - Diagnostic paracentesis as above  7) History of PUD - History of perforated gastric ulcer s/p omental patch in 07/2023  8) Anemia - Acute on chronic anemia.  No overt bleeding - Posttransfusion CBC check - Serial CBCs with additional blood products as needed per protocol  9) Acute esophageal necrosis - Continue Carafate  and high-dose PPI - Tolerating slow advancement of p.o. intake  GI service will continue to follow.  Dr. Belvie Just will assume her ongoing inpatient GI care through the weekend.  A total of 50 minutes of time was spent on this encounter, including in depth chart review, independent review of results as outlined above, communicating results with the patient directly, face-to-face time with the patient, coordinating care, and ordering studies and medications as appropriate, and  documentation.   [address] DO, FACG [phone] office

## 2023-10-12 NOTE — Consult Note (Signed)
 CECYLIA BRAZILL 1970-01-26  [id number].    Requesting MD: Dr. Meliton Monte Chief Complaint/Reason for Consult: Possible Acute Appendicitis  HPI: HALIEGH KHURANA is a 53 y.o. female with a history of hx of Karolynn C Cirrhosis s/p paracentesis 9/25 3.3L removed (SBP ruled out per notes), recent ex lap, gastrorrhaphy, omental patch on 08/05/23 by Dr. 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  Patient reports that she has had approximately 8 days of right-sided abdominal pain. She was tolerating a diet yesterday. Last BM yesterday. Her CT scan shows enlarged appendix up to 14 mm with associated appendiceal wall thickening without appendicolith.  Patient also has moderate volume ascites.  There is no evidence of free air or organized fluid collection.  General surgery was asked to see.   ROS: ROS As above, see hpi  Family History  Problem Relation Age of Onset   Cancer Mother    Leukemia Mother    Multiple myeloma Mother    Cancer Father        unsure what kind   Cancer - Colon Neg Hx    Colon polyps Neg Hx     Past Medical History:  Diagnosis Date   Anxiety    Dysrhythmia    GERD (gastroesophageal reflux disease)    Hernia of abdominal wall    History of hiatal hernia    Sleep apnea    SVT (supraventricular tachycardia)     Past Surgical History:  Procedure Laterality Date   ESOPHAGOGASTRODUODENOSCOPY N/A 05/17/2023   Procedure: EGD (ESOPHAGOGASTRODUODENOSCOPY);  Surgeon: Cindie Carlin POUR, DO;  [facility];  Service: Endoscopy;  Laterality: N/A;   ESOPHAGOGASTRODUODENOSCOPY N/A 10/04/2023   Procedure: EGD  (ESOPHAGOGASTRODUODENOSCOPY);  Surgeon: Cinderella Deatrice FALCON, MD;  Location: [facility];  Service: Endoscopy;  Laterality: N/A;   FINGER FRACTURE SURGERY     FRACTURE SURGERY     GASTRORRHAPHY N/A 08/05/2023   Procedure: GASTRORRHAPHY;  Surgeon: Pander Manuelita BROCKS, MD;  Location: [facility];  Service: General;  Laterality: N/A;   LAPAROTOMY N/A 08/05/2023   Procedure: LAPAROTOMY, EXPLORATORY;  Surgeon: Pander Manuelita BROCKS, MD;  Location: [facility];  Service: General;  Laterality: N/A;   LIVER BIOPSY N/A 08/05/2023   Procedure: BIOPSY, LIVER;  Surgeon: Pander Manuelita BROCKS, MD;  Location: [facility];  Service: General;  Laterality: N/A;   right ankle repair     age 89   UMBILICAL HERNIA REPAIR N/A 08/05/2023   Procedure: REPAIR, HERNIA, UMBILICAL, ADULT;  Surgeon: Pander Manuelita BROCKS, MD;  Location: [facility];  Service: General;  Laterality: N/A;    Social History:  reports that she has been smoking cigarettes. She started smoking about 19 months ago. She has a 28.5 pack-year smoking history. She has never used smokeless tobacco. She reports current alcohol use of about 2.0 standard drinks of alcohol per week. She reports that she does not use drugs.  Allergies:  Allergies  Allergen Reactions   Bee Venom Anaphylaxis    Cannot take epi for this allergy d/t heart condition per pt   Fire Ant Hives and Rash   Latex Rash    Latex band aids, gloves don't bother her  Medications Prior to Admission  Medication Sig Dispense Refill   alum & mag hydroxide-simeth (MAALOX/MYLANTA) 200-200-20 MG/5ML suspension Take 15 mLs by mouth every 6 (six) hours as needed for indigestion or heartburn.     ondansetron  (ZOFRAN ) 4 MG tablet Take 1 tablet (4 mg total) by mouth every 8 (eight) hours as needed for nausea or vomiting.     oxyCODONE -acetaminophen  (PERCOCET/ROXICET) 5-325 MG tablet Take 1 tablet by mouth every 6 (six) hours as needed for severe pain (pain score 7-10). 8 tablet 0   sertraline  (ZOLOFT ) 50 MG tablet Take  1 tablet (50 mg total) by mouth daily. 30 tablet 4   Elastic Bandages & Supports (FITRITE BACK BRACE WITH PULLEY) MISC 1 Units by Does not apply route daily. 1 each 0   ondansetron  (ZOFRAN -ODT) 4 MG disintegrating tablet Take 1 tablet (4 mg total) by mouth every 8 (eight) hours as needed for nausea or vomiting. (Patient not taking: Reported on 10/03/2023) 20 tablet 0   potassium chloride  SA (KLOR-CON  M) 20 MEQ tablet Take 2 tablets (40 mEq total) by mouth daily for 2 doses. (Patient not taking: Reported on 10/03/2023) 4 tablet 0     Physical Exam: Blood pressure (!) 94/55, pulse 76, temperature 98.6 F (37 C), temperature source Oral, resp. rate 16, height 5' 2 (1.575 m), weight 69.7 kg, SpO2 94%. General: pleasant, WD/WN female who is laying in bed in NAD HEENT: head is normocephalic, atraumatic.  Scleral icterus noted .  Heart: regular rate Lungs:   Respiratory effort nonlabored Abd: Soft, distended w/ ascites, generalized ttp greater on the right. Prior midline wound cdi.    Results for orders placed or performed during the hospital encounter of 10/03/23 (from the past 48 hours)  Glucose, capillary     Status: Abnormal   Collection Time: 10/10/23 11:40 AM  Result Value Ref Range   Glucose-Capillary 115 (H) 70 - 99 mg/dL    Comment: Glucose reference range applies only to samples taken after fasting for at least 8 hours.   Comment 1 Notify RN    Comment 2 Document in Chart   Basic metabolic panel     Status: Abnormal   Collection Time: 10/10/23  4:00 PM  Result Value Ref Range   Sodium 125 (L) 135 - 145 mmol/L   Potassium 2.9 (L) 3.5 - 5.1 mmol/L   Chloride 93 (L) 98 - 111 mmol/L   CO2 25 22 - 32 mmol/L   Glucose, Bld 120 (H) 70 - 99 mg/dL    Comment: Glucose reference range applies only to samples taken after fasting for at least 8 hours.   BUN <5 (L) 6 - 20 mg/dL   Creatinine, Ser 9.55 0.44 - 1.00 mg/dL   Calcium  7.5 (L) 8.9 - 10.3 mg/dL   GFR, Estimated >39 >39 mL/min     Comment: (NOTE) Calculated using the CKD-EPI Creatinine Equation (2021)    Anion gap 7 5 - 15    Comment: Performed at [facility], [address] [place], [name] [address]  Albumin      Status: Abnormal   Collection Time: 10/10/23  4:00 PM  Result Value Ref Range   Albumin  2.1 (L) 3.5 - 5.0 g/dL    Comment: Performed at [facility], [address], [place], [name] [address]  Glucose, capillary     Status: Abnormal   Collection Time: 10/10/23  4:29 PM  Result Value Ref Range   Glucose-Capillary 108 (H) 70 - 99 mg/dL  Comment: Glucose reference range applies only to samples taken after fasting for at least 8 hours.   Comment 1 Notify RN    Comment 2 Document in Chart   Glucose, capillary     Status: Abnormal   Collection Time: 10/10/23  7:40 PM  Result Value Ref Range   Glucose-Capillary 123 (H) 70 - 99 mg/dL    Comment: Glucose reference range applies only to samples taken after fasting for at least 8 hours.   Comment 1 Notify RN   Glucose, capillary     Status: Abnormal   Collection Time: 10/10/23 11:09 PM  Result Value Ref Range   Glucose-Capillary 113 (H) 70 - 99 mg/dL    Comment: Glucose reference range applies only to samples taken after fasting for at least 8 hours.   Comment 1 Notify RN   Glucose, capillary     Status: None   Collection Time: 10/11/23  3:29 AM  Result Value Ref Range   Glucose-Capillary 91 70 - 99 mg/dL    Comment: Glucose reference range applies only to samples taken after fasting for at least 8 hours.  CBC with Differential/Platelet     Status: Abnormal   Collection Time: 10/11/23  4:07 AM  Result Value Ref Range   WBC 5.6 4.0 - 10.5 K/uL   RBC 2.45 (L) 3.87 - 5.11 MIL/uL   Hemoglobin 7.9 (L) 12.0 - 15.0 g/dL   HCT 76.2 (L) 63.9 - 53.9 %   MCV 96.7 80.0 - 100.0 fL   MCH 32.2 26.0 - 34.0 pg   MCHC 33.3 30.0 - 36.0 g/dL   RDW 76.7 (H) 88.4 - 84.4 %   Platelets 81 (L) 150 - 400 K/uL    Comment: REPEATED TO VERIFY Immature  Platelet Fraction may be clinically indicated, consider ordering this additional test [id number]    nRBC 0.0 0.0 - 0.2 %   Neutrophils Relative % 56 %   Neutro Abs 3.1 1.7 - 7.7 K/uL   Lymphocytes Relative 17 %   Lymphs Abs 1.0 0.7 - 4.0 K/uL   Monocytes Relative 25 %   Monocytes Absolute 1.4 (H) 0.1 - 1.0 K/uL   Eosinophils Relative 1 %   Eosinophils Absolute 0.1 0.0 - 0.5 K/uL   Basophils Relative 0 %   Basophils Absolute 0.0 0.0 - 0.1 K/uL   WBC Morphology MORPHOLOGY UNREMARKABLE    RBC Morphology See Note    Smear Review See Note     Comment: PLATELETS APPEAR DECREASED   Immature Granulocytes 1 %   Abs Immature Granulocytes 0.05 0.00 - 0.07 K/uL   Polychromasia PRESENT    Target Cells PRESENT     Comment: Performed at [facility], [address], [place], [name] [address]  Comprehensive metabolic panel with GFR     Status: Abnormal   Collection Time: 10/11/23  4:07 AM  Result Value Ref Range   Sodium 126 (L) 135 - 145 mmol/L   Potassium 4.7 3.5 - 5.1 mmol/L    Comment: DELTA CHECK NOTED   Chloride 96 (L) 98 - 111 mmol/L   CO2 25 22 - 32 mmol/L   Glucose, Bld 89 70 - 99 mg/dL    Comment: Glucose reference range applies only to samples taken after fasting for at least 8 hours.   BUN <5 (L) 6 - 20 mg/dL   Creatinine, Ser 9.58 (L) 0.44 - 1.00 mg/dL   Calcium  8.0 (L) 8.9 - 10.3 mg/dL   Total Protein 5.0 (L)  6.5 - 8.1 g/dL   Albumin  2.6 (L) 3.5 - 5.0 g/dL   AST 87 (H) 15 - 41 U/L   ALT 24 0 - 44 U/L   Alkaline Phosphatase 27 (L) 38 - 126 U/L   Total Bilirubin 12.0 (H) 0.0 - 1.2 mg/dL   GFR, Estimated >39 >39 mL/min    Comment: (NOTE) Calculated using the CKD-EPI Creatinine Equation (2021)    Anion gap 5 5 - 15    Comment: Performed at [facility], [address], [place], [name] [address]  Magnesium      Status: Abnormal   Collection Time: 10/11/23  4:07 AM  Result Value Ref Range   Magnesium  1.6 (L) 1.7 - 2.4 mg/dL    Comment: Performed at [facility], [address], [place], [name] [address]  Phosphorus     Status: Abnormal   Collection Time: 10/11/23  4:07 AM  Result Value Ref Range   Phosphorus 1.8 (L) 2.5 - 4.6 mg/dL    Comment: Performed at [facility], [address], [place], [name] [address]  Protime-INR     Status: Abnormal   Collection Time: 10/11/23  4:07 AM  Result Value Ref Range   Prothrombin Time 30.2 (H) 11.4 - 15.2 seconds   INR 2.7 (H) 0.8 - 1.2    Comment: (NOTE) INR goal varies based on device and disease states. Performed at [facility], [address] [place], [name] [address]   Glucose, capillary     Status: None   Collection Time: 10/11/23  9:07 AM  Result Value Ref Range   Glucose-Capillary 74 70 - 99 mg/dL    Comment: Glucose reference range applies only to samples taken after fasting for at least 8 hours.   Comment 1 Notify RN   Glucose, capillary     Status: None   Collection Time: 10/11/23 11:21 AM  Result Value Ref Range   Glucose-Capillary 82 70 - 99 mg/dL    Comment: Glucose reference range applies only to samples taken after fasting for at least 8 hours.   Comment 1 Notify RN   Glucose, capillary     Status: None   Collection Time: 10/11/23  4:16 PM  Result Value Ref Range   Glucose-Capillary 85 70 - 99 mg/dL    Comment: Glucose reference range applies only to samples taken after fasting for at least 8 hours.   Comment 1 Notify RN   Glucose, capillary     Status: Abnormal   Collection Time: 10/11/23  9:16 PM  Result Value Ref Range   Glucose-Capillary 125 (H) 70 - 99 mg/dL    Comment: Glucose reference range applies only to samples taken after fasting for at least 8 hours.   Comment 1 Notify RN    Comment 2 Document in Chart   Glucose, capillary     Status: Abnormal   Collection Time: 10/12/23 12:13 AM  Result Value Ref Range   Glucose-Capillary 101 (H) 70 - 99 mg/dL    Comment: Glucose reference range applies only to samples taken after fasting for at least 8  hours.   Comment 1 Notify RN    Comment 2 Document in Chart   Phosphorus     Status: Abnormal   Collection Time: 10/12/23  1:51 AM  Result Value Ref Range   Phosphorus 5.0 (H) 2.5 - 4.6 mg/dL    Comment: Performed at [facility], [address], [place], [place]  [address]  CBC with Differential/Platelet     Status: Abnormal   Collection Time: 10/12/23  1:51 AM  Result Value Ref Range   WBC 5.5 4.0 - 10.5 K/uL   RBC 2.26 (L) 3.87 - 5.11 MIL/uL   Hemoglobin 7.5 (L) 12.0 - 15.0 g/dL   HCT 77.6 (L) 63.9 - 53.9 %   MCV 98.7 80.0 - 100.0 fL   MCH 33.2 26.0 - 34.0 pg   MCHC 33.6 30.0 - 36.0 g/dL   RDW 76.5 (H) 88.4 - 84.4 %   Platelets 86 (L) 150 - 400 K/uL    Comment: REPEATED TO VERIFY CONSISTENT WITH PREVIOUS RESULT Immature Platelet Fraction may be clinically indicated, consider ordering this additional test [id number]    nRBC 0.0 0.0 - 0.2 %   Neutrophils Relative % 56 %   Neutro Abs 3.1 1.7 - 7.7 K/uL   Lymphocytes Relative 18 %   Lymphs Abs 1.0 0.7 - 4.0 K/uL   Monocytes Relative 23 %   Monocytes Absolute 1.3 (H) 0.1 - 1.0 K/uL   Eosinophils Relative 1 %   Eosinophils Absolute 0.0 0.0 - 0.5 K/uL   Basophils Relative 1 %   Basophils Absolute 0.0 0.0 - 0.1 K/uL   WBC Morphology MORPHOLOGY UNREMARKABLE    Smear Review Normal platelet morphology    Immature Granulocytes 1 %   Abs Immature Granulocytes 0.04 0.00 - 0.07 K/uL   Polychromasia PRESENT     Comment: Performed at [facility], [address] [place], [name] [address]  Comprehensive metabolic panel with GFR     Status: Abnormal   Collection Time: 10/12/23  1:51 AM  Result Value Ref Range   Sodium 128 (L) 135 - 145 mmol/L   Potassium 3.9 3.5 - 5.1 mmol/L   Chloride 98 98 - 111 mmol/L   CO2 21 (L) 22 - 32 mmol/L   Glucose, Bld 99 70 - 99 mg/dL    Comment: Glucose reference range applies only to samples taken after fasting for at least 8 hours.   BUN <5 (L) 6 - 20 mg/dL   Creatinine, Ser 9.59 (L)  0.44 - 1.00 mg/dL   Calcium  7.9 (L) 8.9 - 10.3 mg/dL   Total Protein 5.0 (L) 6.5 - 8.1 g/dL   Albumin  2.8 (L) 3.5 - 5.0 g/dL   AST 78 (H) 15 - 41 U/L   ALT 23 0 - 44 U/L   Alkaline Phosphatase 23 (L) 38 - 126 U/L   Total Bilirubin 13.0 (H) 0.0 - 1.2 mg/dL   GFR, Estimated >39 >39 mL/min    Comment: (NOTE) Calculated using the CKD-EPI Creatinine Equation (2021)    Anion gap 9 5 - 15    Comment: Performed at [facility], [address] [place], [name] [address]  Magnesium      Status: None   Collection Time: 10/12/23  1:51 AM  Result Value Ref Range   Magnesium  2.0 1.7 - 2.4 mg/dL    Comment: Performed at [facility], [address], [place], [name] [address]  Protime-INR     Status: Abnormal   Collection Time: 10/12/23  1:51 AM  Result Value Ref Range   Prothrombin Time 33.5 (H) 11.4 - 15.2 seconds   INR 3.1 (H) 0.8 - 1.2    Comment: (NOTE) INR goal varies based on device and disease states. Performed at [facility], [address] [place], [name] [address]   Glucose, capillary  Status: None   Collection Time: 10/12/23  4:00 AM  Result Value Ref Range   Glucose-Capillary 92 70 - 99 mg/dL    Comment: Glucose reference range applies only to samples taken after fasting for at least 8 hours.   Comment 1 Notify RN    Comment 2 Document in Chart   Glucose, capillary     Status: None   Collection Time: 10/12/23  7:30 AM  Result Value Ref Range   Glucose-Capillary 78 70 - 99 mg/dL    Comment: Glucose reference range applies only to samples taken after fasting for at least 8 hours.   Comment 1 Notify RN    Comment 2 Document in Chart    CT ABDOMEN PELVIS W CONTRAST Result Date: 10/11/2023 CLINICAL DATA:  Abdominal pain, acute, nonlocalized EXAM: CT ABDOMEN AND PELVIS WITH CONTRAST TECHNIQUE: Multidetector CT imaging of the abdomen and pelvis was performed using the standard protocol following bolus administration of intravenous contrast. RADIATION DOSE  REDUCTION: This exam was performed according to the departmental dose-optimization program which includes automated exposure control, adjustment of the mA and/or kV according to patient size and/or use of iterative reconstruction technique. CONTRAST:  75mL OMNIPAQUE  IOHEXOL  350 MG/ML SOLN COMPARISON:  CT abdomen pelvis 09/29/2023, CT abdomen pelvis 05/11/2023 FINDINGS: Lower chest: Trace bilateral, right greater than left pleural effusions. Bilateral lower lobe passive atelectasis. Visualized distal esophagus demonstrates wall thickening. Hepatobiliary: The liver is enlarged measuring up to 19 cm. Nodular hepatic contour with heterogeneous hepatic parenchyma. No focal liver abnormality. No gallstones, gallbladder wall thickening, or pericholecystic fluid. No biliary dilatation. Pancreas: No focal lesion. Normal pancreatic contour. No surrounding inflammatory changes. No main pancreatic ductal dilatation. Spleen: Normal in size without focal abnormality. Adrenals/Urinary Tract: No adrenal nodule bilaterally. Bilateral kidneys enhance symmetrically. No hydronephrosis. No hydroureter. The urinary bladder is unremarkable. Stomach/Bowel: Question gastric antrum wall thickening. No evidence of bowel wall thickening or dilatation. The appendix is enlarged in caliber measuring up to 14 mm. Associated appendiceal wall thickening. No appendicolith. Vascular/Lymphatic: Recanalized paraumbilical vein. No abdominal aorta or iliac aneurysm. Mild atherosclerotic plaque of the aorta and its branches. No abdominal, pelvic, or inguinal lymphadenopathy. Reproductive: Uterus and bilateral adnexa are unremarkable. Other: Moderate volume intraperitoneal free fluid. No intraperitoneal free gas. No organized fluid collection. Musculoskeletal: No abdominal wall hernia or abnormality. Diffuse subcutaneus soft tissue edema. No suspicious lytic or blastic osseous lesions. No acute displaced fracture. Grade 1 anterolisthesis of L4 on L5.  IMPRESSION: 1. Findings suggestive of acute appendicitis.  No appendicolith. 2. Visualized distal esophagus demonstrates wall thickening. Correlate with signs and symptoms of esophagitis. 3. Question gastric antral wall thickening. Finding may be related to gastritis or underlying ulceration. 4. Trace bilateral, right greater than left pleural effusions. 5. Hepatomegaly and cirrhosis with portal hypertension. No focal liver lesions identified. Please note that liver protocol enhanced MR and CT are the most sensitive tests for the screening detection of hepatocellular carcinoma in the high risk setting of cirrhosis. Electronically Signed   By: Morgane  Naveau M.D.   On: 10/11/2023 21:04   ECHOCARDIOGRAM COMPLETE Result Date: 10/11/2023    ECHOCARDIOGRAM REPORT   Patient Name:   WILLOWDEAN LUHMANN Date of Exam: 10/11/2023 Medical Rec #:  [id number]      Height:       62.0 in Accession #:    [id number]     Weight:       92.4 lb Date of Birth:  21-Sep-1970       BSA:  1.377 m Patient Age:    53 years       BP:           92/50 mmHg Patient Gender: F              HR:           92 bpm. Exam Location:  Inpatient Procedure: 2D Echo (Both Spectral and Color Flow Doppler were utilized during            procedure). Indications:    Other abnormalities of the heart  History:        Patient has no prior history of Echocardiogram examinations.                 Other abnormalities of the heart.  Sonographer:    Norleen Amour Referring Phys: [phone] A CALDWELL POWELL JR IMPRESSIONS  1. Left ventricular ejection fraction, by estimation, is 70 to 75%. The left ventricle has hyperdynamic function. The left ventricle has no regional wall motion abnormalities. Left ventricular diastolic parameters were normal.  2. Right ventricular systolic function is normal. The right ventricular size is normal. Tricuspid regurgitation signal is inadequate for assessing PA pressure.  3. The mitral valve is grossly normal. Trivial mitral valve  regurgitation.  4. The aortic valve is tricuspid. Aortic valve regurgitation is not visualized.  5. The inferior vena cava is normal in size with greater than 50% respiratory variability, suggesting right atrial pressure of 3 mmHg. Comparison(s): No prior Echocardiogram. FINDINGS  Left Ventricle: Left ventricular ejection fraction, by estimation, is 70 to 75%. The left ventricle has hyperdynamic function. The left ventricle has no regional wall motion abnormalities. The left ventricular internal cavity size was normal in size. There is no left ventricular hypertrophy. Left ventricular diastolic parameters were normal. Right Ventricle: The right ventricular size is normal. No increase in right ventricular wall thickness. Right ventricular systolic function is normal. Tricuspid regurgitation signal is inadequate for assessing PA pressure. Left Atrium: Left atrial size was normal in size. Right Atrium: Right atrial size was normal in size. Pericardium: There is no evidence of pericardial effusion. Mitral Valve: The mitral valve is grossly normal. Trivial mitral valve regurgitation. Tricuspid Valve: The tricuspid valve is not well visualized. Tricuspid valve regurgitation is not demonstrated. Aortic Valve: The aortic valve is tricuspid. Aortic valve regurgitation is not visualized. Pulmonic Valve: The pulmonic valve was normal in structure. Pulmonic valve regurgitation is not visualized. Aorta: The aortic root and ascending aorta are structurally normal, with no evidence of dilitation. Venous: The inferior vena cava is normal in size with greater than 50% respiratory variability, suggesting right atrial pressure of 3 mmHg. IAS/Shunts: No atrial level shunt detected by color flow Doppler.  LEFT VENTRICLE PLAX 2D LVIDd:         4.30 cm     Diastology LVIDs:         2.00 cm     LV e' medial:    10.80 cm/s LV PW:         0.90 cm     LV E/e' medial:  8.4 LV IVS:        0.70 cm     LV e' lateral:   12.20 cm/s LVOT diam:      2.20 cm     LV E/e' lateral: 7.4 LV SV:         81 LV SV Index:   59 LVOT Area:     3.80 cm  LV Volumes (MOD) LV vol  d, MOD A2C: 58.0 ml LV vol d, MOD A4C: 78.2 ml LV vol s, MOD A2C: 12.6 ml LV vol s, MOD A4C: 20.2 ml LV SV MOD A2C:     45.4 ml LV SV MOD A4C:     78.2 ml LV SV MOD BP:      54.8 ml RIGHT VENTRICLE             IVC RV Basal diam:  2.50 cm     IVC diam: 0.90 cm RV S prime:     11.30 cm/s TAPSE (M-mode): 1.9 cm LEFT ATRIUM             Index        RIGHT ATRIUM          Index LA diam:        3.10 cm 2.25 cm/m   RA Area:     9.91 cm LA Vol (A2C):   16.5 ml 11.99 ml/m  RA Volume:   18.40 ml 13.37 ml/m LA Vol (A4C):   31.4 ml 22.81 ml/m LA Biplane Vol: 23.9 ml 17.36 ml/m  AORTIC VALVE             PULMONIC VALVE LVOT Vmax:   103.00 cm/s PV Vmax:       0.84 m/s LVOT Vmean:  73.800 cm/s PV Peak grad:  2.8 mmHg LVOT VTI:    0.213 m  AORTA Ao Root diam: 3.10 cm Ao Asc diam:  2.50 cm MITRAL VALVE MV Area (PHT): 3.34 cm    SHUNTS MV Decel Time: 227 msec    Systemic VTI:  0.21 m MV E velocity: 90.70 cm/s  Systemic Diam: 2.20 cm MV A velocity: 74.40 cm/s MV E/A ratio:  1.22 Vinie Maxcy MD Electronically signed by Vinie Maxcy MD Signature Date/Time: 10/11/2023/11:26:23 AM    Final    IR ABDOMEN US  LIMITED Result Date: 10/10/2023 CLINICAL DATA:  Patient with history of decompensated alcoholic liver cirrhosis, ascites. Request for paracentesis. EXAM: LIMITED ABDOMEN ULTRASOUND FOR ASCITES TECHNIQUE: Limited ultrasound survey for ascites was performed in all four abdominal quadrants. COMPARISON:  None Available. FINDINGS: No evidence of ascites in all four abdominal quadrants. IMPRESSION: No ascites amendable to paracentesis.  No procedure performed. Performed by: Kristi Davenport, NP Electronically Signed   By: Wilkie Lent M.D.   On: 10/10/2023 15:27    Anti-infectives (From admission, onward)    Start     Dose/Rate Route Frequency Ordered Stop   10/12/23 1000  metroNIDAZOLE  (FLAGYL ) tablet 500  mg        500 mg Oral Every 12 hours 10/12/23 0734     10/12/23 0830  cefTRIAXone  (ROCEPHIN ) 2 g in sodium chloride  0.9 % 100 mL IVPB        2 g 200 mL/hr over 30 Minutes Intravenous Every 24 hours 10/12/23 0734     10/07/23 1000  fluconazole  (DIFLUCAN ) tablet 100 mg  Status:  Discontinued        100 mg Oral Daily 10/07/23 0853 10/07/23 0855   10/07/23 1000  fluconazole  (DIFLUCAN ) tablet 150 mg        150 mg Oral Daily 10/07/23 0855 10/07/23 1014   10/04/23 1000  cefTRIAXone  (ROCEPHIN ) 2 g in sodium chloride  0.9 % 100 mL IVPB        2 g 200 mL/hr over 30 Minutes Intravenous Every 24 hours 10/03/23 2154 10/08/23 0854   10/03/23 2015  cefTRIAXone  (ROCEPHIN ) 1 g in sodium chloride  0.9 % 100 mL IVPB  1 g 200 mL/hr over 30 Minutes Intravenous  Once 10/03/23 2010 10/03/23 2212       Assessment/Plan Possible Appendicitis  This is a 53 year old female with a 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  Patient reports that she has had approximately 8 days of right-sided abdominal pain.  Her CT scan shows enlarged appendix up to 14 mm with associated appendiceal wall thickening without appendicolith.  Patient also has moderate volume ascites.  There is no evidence of free air or organized fluid collection.    On exam she has generalized tenderness palpation that is greater on the right.  She is afebrile without tachycardia.  Noted soft BPs and she is on midodrine  here.  WBC  5.5.  Recommendations - Unclear if patient has appendicitis or if this is reactive changes from her ascites.  Would recommend covering with antibiotics.   - Patient is a Karolynn C Cirrhotic based on her most recent labs and imaging.  This places her abdominal surgery perioperative mortality at 82%. Her MELD-NA score is 32 which placed patient at a 65-66% estimated 90-day mortality.  She does not have an appendicolith on imaging.  Would not recommend emergency surgery.   FEN - Okay for diet VTE - SCDs, per primary ID - Rocephin /Flagyl   I reviewed nursing notes, Consultant (GI) notes, hospitalist notes, last 24 h vitals and pain scores, last 48 h intake and output, last 24 h labs and trends, and last 24 h imaging results.   Ozell [name] Shaper, [facility] 10/12/2023, 10:29 AM Please see Amion for pager number during day hours 7:00am-4:30pm

## 2023-10-13 ENCOUNTER — Inpatient Hospital Stay (HOSPITAL_COMMUNITY)

## 2023-10-13 DIAGNOSIS — R197 Diarrhea, unspecified: Secondary | ICD-10-CM | POA: Diagnosis not present

## 2023-10-13 DIAGNOSIS — R112 Nausea with vomiting, unspecified: Secondary | ICD-10-CM | POA: Diagnosis not present

## 2023-10-13 LAB — TYPE AND SCREEN
ABO/RH(D): A POS
Antibody Screen: NEGATIVE
Unit division: 0

## 2023-10-13 LAB — GLUCOSE, CAPILLARY
Glucose-Capillary: 104 mg/dL — ABNORMAL HIGH (ref 70–99)
Glucose-Capillary: 110 mg/dL — ABNORMAL HIGH (ref 70–99)
Glucose-Capillary: 120 mg/dL — ABNORMAL HIGH (ref 70–99)
Glucose-Capillary: 121 mg/dL — ABNORMAL HIGH (ref 70–99)
Glucose-Capillary: 135 mg/dL — ABNORMAL HIGH (ref 70–99)
Glucose-Capillary: 90 mg/dL (ref 70–99)

## 2023-10-13 LAB — MAGNESIUM: Magnesium: 1.7 mg/dL (ref 1.7–2.4)

## 2023-10-13 LAB — COMPREHENSIVE METABOLIC PANEL WITH GFR
ALT: 29 U/L (ref 0–44)
AST: 108 U/L — ABNORMAL HIGH (ref 15–41)
Albumin: 2.8 g/dL — ABNORMAL LOW (ref 3.5–5.0)
Alkaline Phosphatase: 30 U/L — ABNORMAL LOW (ref 38–126)
Anion gap: 12 (ref 5–15)
BUN: <5 mg/dL — ABNORMAL LOW (ref 6–20)
CO2: 23 mmol/L (ref 22–32)
Calcium: 8.1 mg/dL — ABNORMAL LOW (ref 8.9–10.3)
Chloride: 93 mmol/L — ABNORMAL LOW (ref 98–111)
Creatinine, Ser: 0.58 mg/dL (ref 0.44–1.00)
GFR, Estimated: >60 mL/min (ref >60)
Glucose, Bld: 103 mg/dL — ABNORMAL HIGH (ref 70–99)
Potassium: 2.8 mmol/L — ABNORMAL LOW (ref 3.5–5.1)
Sodium: 128 mmol/L — ABNORMAL LOW (ref 135–145)
Total Bilirubin: 12.7 mg/dL — ABNORMAL HIGH (ref 0.0–1.2)
Total Protein: 5.6 g/dL — ABNORMAL LOW (ref 6.5–8.1)

## 2023-10-13 LAB — CBC WITH DIFFERENTIAL/PLATELET
Basophils Absolute: 0 K/uL (ref 0.0–0.1)
Basophils Relative: 0 %
Eosinophils Absolute: 0 K/uL (ref 0.0–0.5)
Eosinophils Relative: 0 %
HCT: 28.3 % — ABNORMAL LOW (ref 36.0–46.0)
Hemoglobin: 9.9 g/dL — ABNORMAL LOW (ref 12.0–15.0)
Lymphocytes Relative: 11 %
Lymphs Abs: 1 K/uL (ref 0.7–4.0)
MCH: 32.2 pg (ref 26.0–34.0)
MCHC: 35 g/dL (ref 30.0–36.0)
MCV: 92.2 fL (ref 80.0–100.0)
Monocytes Absolute: 1.4 K/uL — ABNORMAL HIGH (ref 0.1–1.0)
Monocytes Relative: 15 %
Neutro Abs: 6.7 K/uL (ref 1.7–7.7)
Neutrophils Relative %: 74 %
Platelets: 99 K/uL — ABNORMAL LOW (ref 150–400)
RBC: 3.07 MIL/uL — ABNORMAL LOW (ref 3.87–5.11)
RDW: 22.5 % — ABNORMAL HIGH (ref 11.5–15.5)
WBC: 9.1 K/uL (ref 4.0–10.5)
nRBC: 0 % (ref 0.0–0.2)

## 2023-10-13 LAB — BODY FLUID CELL COUNT WITH DIFFERENTIAL
Eos, Fluid: 0 %
Lymphs, Fluid: 19 %
Monocyte-Macrophage-Serous Fluid: 75 % (ref 50–90)
Neutrophil Count, Fluid: 6 % (ref 0–25)
Total Nucleated Cell Count, Fluid: 124 uL (ref 0–1000)

## 2023-10-13 LAB — PHOSPHORUS: Phosphorus: 3.7 mg/dL (ref 2.5–4.6)

## 2023-10-13 LAB — BPAM RBC
Blood Product Expiration Date: 202510312359
ISSUE DATE / TIME: 202510032230
Unit Type and Rh: 6200

## 2023-10-13 LAB — PROTIME-INR
INR: 2.9 — ABNORMAL HIGH (ref 0.8–1.2)
Prothrombin Time: 31.3 s — ABNORMAL HIGH (ref 11.4–15.2)

## 2023-10-13 MED ORDER — POTASSIUM CHLORIDE CRYS ER 20 MEQ PO TBCR
40.0000 meq | EXTENDED_RELEASE_TABLET | ORAL | Status: AC
Start: 2023-10-13 — End: 2023-10-14
  Administered 2023-10-13 – 2023-10-14 (×3): 40 meq via ORAL
  Filled 2023-10-13 (×3): qty 2

## 2023-10-13 MED ORDER — POTASSIUM CHLORIDE CRYS ER 20 MEQ PO TBCR: 40.0000 meq | EXTENDED_RELEASE_TABLET | ORAL | Status: DC

## 2023-10-13 MED ORDER — GABAPENTIN 300 MG PO CAPS
300.0000 mg | ORAL_CAPSULE | Freq: Three times a day (TID) | ORAL | Status: DC
  Administered 2023-10-13 – 2023-11-22 (×117): 300 mg via ORAL
  Filled 2023-10-13 (×117): qty 1

## 2023-10-13 MED ORDER — LIDOCAINE HCL (PF) 1 % IJ SOLN
10.0000 mL | Freq: Once | INTRAMUSCULAR | Status: AC
  Administered 2023-10-13: 10 mL

## 2023-10-13 NOTE — Progress Notes (Signed)
 Occupational Therapy Treatment Patient Details Name: Joan Wood MRN: [id number] DOB: 1970-08-28 Today's Date: 10/13/2023   History of present illness 53 y.o. female presents to [facility] hospital on 10/03/2023 with nausea/vomiting and bloody diarrhea. Pt underwent upper EGD on 9/25, concerning for possible acute esophageal necrosis. Pt also underwent paracentesis on 9/25. PMH includes decompensated cirrhosis, PUD, GERD, pancreatitis, OSA.   OT comments  Pt. Seen for skilled OT treatment session.  Pt. Able to complete bed mobility to eob with MIN A.  MAX A for back to bed.  Squat pivot to/from bsc with MOD A.  Education on safe methods for completing peri care for fall prevention.  Cues for redirection from self distraction and anxiety during task initiation and completion.  Orthostatic BPs taken at beginning of session.  See below for details.  Cont. With acute OT POC.    Supine: 97/64-92 Sit: 90/54-101 After squat pivot to bsc: 85/55-108 Rn present for these and aware of readings      If plan is discharge home, recommend the following:  A lot of help with walking and/or transfers;A lot of help with bathing/dressing/bathroom;Direct supervision/assist for medications management;Direct supervision/assist for financial management   Equipment Recommendations  BSC/3in1    Recommendations for Other Services      Precautions / Restrictions Precautions Precautions: Fall Recall of Precautions/Restrictions: Impaired       Mobility Bed Mobility Overal bed mobility: Needs Assistance Bed Mobility: Sit to Supine, Supine to Sit     Supine to sit: Min assist, HOB elevated, Used rails Sit to supine: Max assist   General bed mobility comments: Min A to scoot hips to EOB and max A for BLE elevation back to EOB    Transfers Overall transfer level: Needs assistance Equipment used: None Transfers: Bed to chair/wheelchair/BSC     Squat pivot transfers: Mod assist       General transfer  comment: eob to 3n1 cues for hand placement for squat pivot, good adherance to cues.  back to bed pt. in squat position and not reaching for bed with cues therapist asst. had to physically intervent to get pivot initated and guide trunk towards the L back to bed, pt. yelling about R side pain with the location of therapist asst. hands on trunk/waist to finish the pivot and prevent pt. being stuck mid transfer. pt. verbalized understanding stating trunk hurt prior it just exasperated it during the pivot with me having to intervene and move the pivot to the bed, rn notified also. pt. had been premedicated and also had new pain patches applied in the R trunk/side/back area at beg. of session for the pre existing pain in this area     Balance                                           ADL either performed or assessed with clinical judgement   ADL Overall ADL's : Needs assistance/impaired                         Toilet Transfer: Moderate assistance;BSC/3in1;Squat-pivot Toilet Transfer Details (indicate cue type and reason): max cues for hand placement, pt. self distracting mid transfer in each direction requiring physical intervention for pt. safety during back to bed transfer Toileting- Clothing Manipulation and Hygiene: Moderate assistance;Sitting/lateral lean;Sit to/from stand Toileting - Clothing Manipulation Details (indicate cue type and  reason): pt. attempting to stand and lean forward with head almost between knees to reach buttocks, sliding and pushing 3n1 with the force of BLEs max encouragement to reach behind to access buttocks vs her method. attempted education on safey rationale behind this, pt. very easily agitated making further guidance and instruction difficult     Functional mobility during ADLs: Moderate assistance General ADL Comments: squat pivot to/from eob to 3n1    Extremity/Trunk Assessment              Vision       Perception      Praxis     Communication Communication Communication: No apparent difficulties   Cognition Arousal: Alert Behavior During Therapy: WFL for tasks assessed/performed, Anxious               OT - Cognition Comments: pt following simple commands but requires cueing to complete tasks, easily distracted with poor recall of what she was doing                 Following commands: Impaired Following commands impaired: Follows one step commands with increased time, Follows multi-step commands inconsistently      Cueing   Cueing Techniques: Verbal cues, Visual cues, Tactile cues  Exercises      Shoulder Instructions       General Comments      Pertinent Vitals/ Pain       Pain Assessment Pain Assessment: Faces Pain Location: stomach, posterior shoulders, skin, bottom Pain Descriptors / Indicators: Aching, Discomfort Pain Intervention(s): Repositioned, Limited activity within patient's tolerance  Home Living                                          Prior Functioning/Environment              Frequency  Min 2X/week        Progress Toward Goals  OT Goals(current goals can now be found in the care plan section)  Progress towards OT goals: Progressing toward goals     Plan      Co-evaluation                 AM-PAC OT 6 Clicks Daily Activity     Outcome Measure   Help from another person eating meals?: A Little Help from another person taking care of personal grooming?: A Little Help from another person toileting, which includes using toliet, bedpan, or urinal?: A Lot Help from another person bathing (including washing, rinsing, drying)?: A Lot Help from another person to put on and taking off regular upper body clothing?: A Little Help from another person to put on and taking off regular lower body clothing?: A Lot 6 Click Score: 15    End of Session    OT Visit Diagnosis: Other abnormalities of gait and mobility  (R26.89);Muscle weakness (generalized) (M62.81);Pain;Other symptoms and signs involving cognitive function   Activity Tolerance Patient tolerated treatment well   Patient Left in bed;with call bell/phone within reach;with bed alarm set   Nurse Communication Other (comment) (rn present for majority of session and also notifed about the transfer and trunk pain returning to bed along with bright red blood in bsc and with peri care)        Time: 9063-8987 OT Time Calculation (min): 36 min  Charges: OT General Charges $OT Visit: 1 Visit OT Treatments $Self Care/Home Management :  23-37 mins  Randall, COTA/L Acute Rehabilitation [phone]   [name] Nest Lorraine-COTA/L  10/13/2023, 10:32 AM

## 2023-10-13 NOTE — Progress Notes (Signed)
 PROGRESS NOTE    Joan Wood  FMW:[id number] DOB: [birth year] DOA: 10/03/2023 PCP: Edman Meade PEDLAR, FNP  Chief Complaint  Patient presents with   Emesis   Nausea   Diarrhea    Brief Narrative:   Joan Wood is Joan Wood 53 year old female with decompensated cirrhosis, PUD with recent gastric ulcer perforation s/p ex-lap, GERD, pancreatitis, OSA presented to [facility] with N/V/bloody diarrhea x 4 days. In the ED had coffee ground emesis. CTA GI neg for acute bleed, large ascites and new distal esophageal thickening. GI consulted for acute blood loss anemia with Hg 8.3>7. She was given PPI, started on octreotide  gtt, rocephin , vitamin K , pain and nausea control and admitted to hospitalist. GI was consulted to evaluate 9/25 AM. Hgb with drop from 8.3 >7 and 1 U PRBC ordered. GI saw patient and proceeded with upper endoscopy showing black discoloration of esophageal mucosa concerning for acute esophageal necrosis, portal hypertension, non-bleeding gastric and duodenal ulcers. Transferred to [facility] ICU for further management. She had paracentesis 10/04/23 with removed. GI following, continue PPI, octreotide , started clears, Hb remained stable transferred to [facility] service 10/06/23   Assessment & Plan:   Principal Problem:   GI bleed Active Problems:   Tobacco abuse   Alcohol use disorder   Hyponatremia   Nausea vomiting and diarrhea   Acute on chronic blood loss anemia   Liver cirrhosis (HCC)   Esophageal necrosis   Decompensated cirrhosis (HCC)   ABLA (acute blood loss anemia)  Decompensated alcoholic liver cirrhosis Volume Overload  Portal gastropathy Hyperbilirubinemia Ascites  Bilateral Pleural Effusions  Hypoalbuminemia  Thrombocytopenia  s/p paracentesis 9/25 3.3L removed. (SBP ruled out) 10/4 para without evidence of SBP, follow culture S/p 5 days ceftriaxone   Repeat US  10/1 without sufficient ascites to tap She's volume overloaded, but diuresis limited by hypotension - started  midodrine  and albumin , gentle lasix/spironolactone oral (if tolerated) - follow Appreciate GI recommendations  Echo with preserved EF INR and bili progressively worsening  Meld 10/2 is 32, 52.6% 3 month mortality    Acute upper GI bleed Acute esophageal necrosis Acute blood loss anemia Patient has h/o PUD, gastric ulcer perforation s/p sx lap 07/2023. Now s/p EGD showing grade D esophagitis, black discoloration mucosa in esophagus concerning for acute esophageal necrosis, portal hypertensive gastropathy, non bleeding gastric ulcer with clean ulcer base with apparent sutures.  No bleeding duodenal ulcers with Joan Wood clean ulcer base.  No evidence of active bleeding or varices.   Transferred to [facility] given findings concerning for acute esophageal necrosis Gastrin level wnl  GI has now signed off, recommending carafate  x14 days, BID PPI until follow up with GI primary at [facility].  Needs ensure 2-3 times daily.  Currently on soft diet.     Orthostatic Hypotension S/p 1 unit pRBC Follow orthostatics   Abdominal Pain CT with findings concerning for appendicitis.  Distal esophagus wall thickening, concerning for esophagitis.  Gastric antral wall thickening (gastritis or underlying ulceration).  Bilateral R/L effusions.  Hepatomegaly and cirrhosis with portal hypertension.  Manage symptomatically   Appendicitis Appreciate gen surgery, recommending abx -ok for diet she's high risk for surgery in setting of her cirrhosis  Right Lower Extremity Edema Follow LE US  - no evidence of deep vein thrombosis in lower extremity More tender as well, consider cellulitis, but not particularly erythematous and normal white count - will consider additional imaging based on US  findings (describes burning sensation all the way down leg - neuropathic pain? - titrate gabapentin )  Hypervolemic Hyponatremia Gradually improving Adding on oral lasix/spironolactone    Hypomagnesemia  Hypophosphatemia   Hypokalemia Follow, replace and follow    Tobacco abuse Continue nicotine  patch.   Alcohol abuse CIWA protocol. Continue thiamine , folate, MVT.  Severe Protein Calorie Malnutrition Body mass index is 28.95 kg/m. RD    DVT prophylaxis: SCD Code Status: full Family Communication: none Disposition:   Status is: Inpatient Remains inpatient appropriate because: need for continued inpatient care   Consultants:  GI  Procedures:   Upper endoscopy - grade D esophagitis, black discoloration mucosa in esophagus concerning for acute esophageal necrosis, portal hypertensive gastropathy, nonbleeding gastric ulcer with clean base with apparent sutures, non bleeding duodenal ulcers with clean base  Echo IMPRESSIONS     1. Left ventricular ejection fraction, by estimation, is 70 to 75%. The  left ventricle has hyperdynamic function. The left ventricle has no  regional wall motion abnormalities. Left ventricular diastolic parameters  were normal.   2. Right ventricular systolic function is normal. The right ventricular  size is normal. Tricuspid regurgitation signal is inadequate for assessing  PA pressure.   3. The mitral valve is grossly normal. Trivial mitral valve  regurgitation.   4. The aortic valve is tricuspid. Aortic valve regurgitation is not  visualized.   5. The inferior vena cava is normal in size with greater than 50%  respiratory variability, suggesting right atrial pressure of 3 mmHg.   Comparison(s): No prior Echocardiogram.   LE US  Summary:  RIGHT:      - There is no evidence of deep vein thrombosis in the lower extremity.    - No cystic structure found in the popliteal fossa.    LEFT:  - No evidence of common femoral vein obstruction.  Antimicrobials:  Anti-infectives (From admission, onward)    Start     Dose/Rate Route Frequency Ordered Stop   10/12/23 1000  metroNIDAZOLE  (FLAGYL ) tablet 500 mg        500 mg Oral Every 12 hours 10/12/23 0734      10/12/23 0830  cefTRIAXone  (ROCEPHIN ) 2 g in sodium chloride  0.9 % 100 mL IVPB        2 g 200 mL/hr over 30 Minutes Intravenous Every 24 hours 10/12/23 0734     10/07/23 1000  fluconazole  (DIFLUCAN ) tablet 100 mg  Status:  Discontinued        100 mg Oral Daily 10/07/23 0853 10/07/23 0855   10/07/23 1000  fluconazole  (DIFLUCAN ) tablet 150 mg        150 mg Oral Daily 10/07/23 0855 10/07/23 1014   10/04/23 1000  cefTRIAXone  (ROCEPHIN ) 2 g in sodium chloride  0.9 % 100 mL IVPB        2 g 200 mL/hr over 30 Minutes Intravenous Every 24 hours 10/03/23 2154 10/08/23 0854   10/03/23 2015  cefTRIAXone  (ROCEPHIN ) 1 g in sodium chloride  0.9 % 100 mL IVPB        1 g 200 mL/hr over 30 Minutes Intravenous  Once 10/03/23 2010 10/03/23 2212       Subjective: Unchanged pain   Objective: Vitals:   10/13/23 0817 10/13/23 1206 10/13/23 1603 10/13/23 1614  BP: 97/60 (!) 92/57 (!) 86/56 (!) 85/54  Pulse: 88 88 87   Resp: 18 18    Temp: 98.5 F (36.9 C) 98.9 F (37.2 C) 98.7 F (37.1 C)   TempSrc: Oral Oral    SpO2: 98% 98% 97%   Weight:      Height:  Intake/Output Summary (Last 24 hours) at 10/13/2023 1626 Last data filed at 10/13/2023 0400 Gross per 24 hour  Intake 514 ml  Output 350 ml  Net 164 ml   Filed Weights   10/09/23 0500 10/12/23 0500 10/13/23 0500  Weight: 41.9 kg 69.7 kg 71.8 kg    Examination:  General: No acute distress. Cardiovascular: RRR Lungs: unlabored Abdomen: tight, distended Neurological: Alert and oriented 3. Moves all extremities 4 with equal strength. Cranial nerves II through XII grossly intact. Skin: Warm and dry. No rashes or lesions. Extremities: R>LLE edema - TTP to RLE to light touch (no crepitus, fluctuance)  Data Reviewed: I have personally reviewed following labs and imaging studies  CBC: Recent Labs  Lab 10/08/23 0448 10/10/23 0540 10/11/23 0407 10/12/23 0151 10/13/23 0852  WBC 3.3* 3.8* 5.6 5.5 9.1  NEUTROABS  --   --  3.1 3.1  6.7  HGB 7.9* 7.9* 7.9* 7.5* 9.9*  HCT 23.4* 23.3* 23.7* 22.3* 28.3*  MCV 95.5 96.3 96.7 98.7 92.2  PLT 44* 62* 81* 86* 99*    Basic Metabolic Panel: Recent Labs  Lab 10/08/23 0448 10/10/23 0540 10/10/23 1600 10/11/23 0407 10/12/23 0151 10/13/23 0852  NA 125* 125* 125* 126* 128* 128*  K 4.1 2.8* 2.9* 4.7 3.9 2.8*  CL 96* 93* 93* 96* 98 93*  CO2 23 25 25 25  21* 23  GLUCOSE 78 127* 120* 89 99 103*  BUN <5* <5* <5* <5* <5* <5*  CREATININE 0.53 0.48 0.44 0.41* 0.40* 0.58  CALCIUM  7.6* 7.6* 7.5* 8.0* 7.9* 8.1*  MG 1.6*  --   --  1.6* 2.0 1.7  PHOS 2.5 2.1*  --  1.8* 5.0* 3.7    GFR: Estimated Creatinine Clearance: 74.6 mL/min (by C-G formula based on SCr of 0.58 mg/dL).  Liver Function Tests: Recent Labs  Lab 10/06/23 2045 10/07/23 0222 10/08/23 0448 10/10/23 1600 10/11/23 0407 10/12/23 0151 10/13/23 0852  AST 70*  --   --   --  87* 78* 108*  ALT 24  --   --   --  24 23 29   ALKPHOS 45  --   --   --  27* 23* 30*  BILITOT 3.8*  --   --   --  12.0* 13.0* 12.7*  PROT 4.5*  --   --   --  5.0* 5.0* 5.6*  ALBUMIN  <1.5*   < > 2.2* 2.1* 2.6* 2.8* 2.8*   < > = values in this interval not displayed.    CBG: Recent Labs  Lab 10/12/23 2359 10/13/23 0415 10/13/23 0818 10/13/23 1209 10/13/23 1604  GLUCAP 104* 121* 110* 90 120*     Recent Results (from the past 240 hours)  MRSA Next Gen by PCR, Nasal     Status: None   Collection Time: 10/03/23 10:20 PM   Specimen: Nasal Mucosa; Nasal Swab  Result Value Ref Range Status   MRSA by PCR Next Gen NOT DETECTED NOT DETECTED Final    Comment: (NOTE) The GeneXpert MRSA Assay (FDA approved for NASAL specimens only), is one component of Tomer Chalmers comprehensive MRSA colonization surveillance program. It is not intended to diagnose MRSA infection nor to guide or monitor treatment for MRSA infections. Test performance is not FDA approved in patients less than 61 years old. Performed at [facility], [address], [place], [name]  [address]   Peritoneal fluid culture w Gram Stain     Status: None   Collection Time: 10/04/23  6:23 PM   Specimen:  Peritoneal Washings; Peritoneal Fluid  Result Value Ref Range Status   Specimen Description PERITONEAL  Final   Special Requests NONE  Final   Gram Stain   Final    WBC PRESENT,BOTH PMN AND MONONUCLEAR NO ORGANISMS SEEN CYTOSPIN SMEAR    Culture   Final    NO GROWTH 3 DAYS Performed at [facility], [address] [place], [name] [address]    Report Status 10/08/2023 FINAL  Final         Radiology Studies: US  Paracentesis Result Date: 10/13/2023 INDICATION: History of decompensated alcoholic cirrhosis with ascites. Interventional radiology asked to perform Laquasha Groome diagnostic paracentesis with Laurence Crofford 100 mL limit. EXAM: ULTRASOUND GUIDED PARACENTESIS MEDICATIONS: 1% lidocaine  10 ml . COMPLICATIONS: None immediate. PROCEDURE: Informed written consent was obtained from the patient after Lindsea Olivar discussion of the risks, benefits and alternatives to treatment. Kian Ottaviano timeout was performed prior to the initiation of the procedure. Initial ultrasound scanning demonstrates Edd Reppert large amount of ascites within the right lower abdominal quadrant. The right lower abdomen was prepped and draped in the usual sterile fashion. 1% lidocaine  was used for local anesthesia. Following this, Kayleanna Lorman 19 gauge, 7-cm, Yueh catheter was introduced. An ultrasound image was saved for documentation purposes. The paracentesis was performed. The catheter was removed and Samyukta Cura dressing was applied. The patient tolerated the procedure well without immediate post procedural complication. FINDINGS: Curry Seefeldt total of approximately 100 ml of bright yellow fluid was removed. Samples were sent to the laboratory as requested by the clinical team. IMPRESSION: Successful ultrasound-guided paracentesis yielding 100 ml of peritoneal fluid. Procedure performed by: Warren Dais, NP PLAN: If the patient eventually requires >/=2 paracenteses in Brenda Samano 30 day  period, candidacy for formal evaluation by the [facility] Interventional Radiology Portal Hypertension Clinic will be assessed. Electronically Signed   By: Cordella Banner   On: 10/13/2023 13:06   VAS US  LOWER EXTREMITY VENOUS (DVT) Result Date: 10/12/2023  Lower Venous DVT Study Patient Name:  Joan Wood  Date of Exam:   10/12/2023 Medical Rec #: [id number]       Accession #:    [id number] Date of Birth: 02-Feb-1970        Patient Gender: F Patient Age:   89 years Exam Location:  [facility] Procedure:      VAS US  LOWER EXTREMITY VENOUS (DVT) Referring Phys: Dakota Vanwart POWELL JR --------------------------------------------------------------------------------  Indications: Perforated gastric ulcer repair 7.27.2025., and Edema. Other Indications: Smoker, alcohol abuse. Comparison Study: No prior exam. Performing Technologist: Edilia Elden Appl  Examination Guidelines: Moua Rasmusson complete evaluation includes B-mode imaging, spectral Doppler, color Doppler, and power Doppler as needed of all accessible portions of each vessel. Bilateral testing is considered an integral part of Kona Lover complete examination. Limited examinations for reoccurring indications may be performed as noted. The reflux portion of the exam is performed with the patient in reverse Trendelenburg.  +---------+---------------+---------+-----------+----------+--------------+ RIGHT    CompressibilityPhasicitySpontaneityPropertiesThrombus Aging +---------+---------------+---------+-----------+----------+--------------+ CFV      Full           Yes      Yes                                 +---------+---------------+---------+-----------+----------+--------------+ SFJ      Full           Yes      Yes                                 +---------+---------------+---------+-----------+----------+--------------+  FV Prox  Full                                                         +---------+---------------+---------+-----------+----------+--------------+ FV Mid   Full                                                        +---------+---------------+---------+-----------+----------+--------------+ FV DistalFull                                                        +---------+---------------+---------+-----------+----------+--------------+ PFV      Full                                                        +---------+---------------+---------+-----------+----------+--------------+ POP      Full           Yes      Yes                                 +---------+---------------+---------+-----------+----------+--------------+ PTV      Full                                                        +---------+---------------+---------+-----------+----------+--------------+ PERO     Full                                                        +---------+---------------+---------+-----------+----------+--------------+   +----+---------------+---------+-----------+----------+--------------+ LEFTCompressibilityPhasicitySpontaneityPropertiesThrombus Aging +----+---------------+---------+-----------+----------+--------------+ CFV Full           Yes      Yes                                 +----+---------------+---------+-----------+----------+--------------+ SFJ Full           Yes      Yes                                 +----+---------------+---------+-----------+----------+--------------+    Summary: RIGHT: - There is no evidence of deep vein thrombosis in the lower extremity.  - No cystic structure found in the popliteal fossa.  LEFT: - No evidence of common femoral vein obstruction.   *See table(s) above for measurements and observations.    Preliminary    CT ABDOMEN PELVIS W CONTRAST Result Date: 10/11/2023 CLINICAL DATA:  Abdominal pain, acute, nonlocalized EXAM: CT ABDOMEN AND PELVIS WITH CONTRAST TECHNIQUE: Multidetector CT imaging  of the abdomen and pelvis was performed using the standard protocol following bolus administration of intravenous contrast. RADIATION DOSE REDUCTION: This exam was performed according to the departmental dose-optimization program which includes automated exposure control, adjustment of the mA and/or kV according to patient size and/or use of iterative reconstruction technique. CONTRAST:  75mL OMNIPAQUE  IOHEXOL  350 MG/ML SOLN COMPARISON:  CT abdomen pelvis 09/29/2023, CT abdomen pelvis 05/11/2023 FINDINGS: Lower chest: Trace bilateral, right greater than left pleural effusions. Bilateral lower lobe passive atelectasis. Visualized distal esophagus demonstrates wall thickening. Hepatobiliary: The liver is enlarged measuring up to 19 cm. Nodular hepatic contour with heterogeneous hepatic parenchyma. No focal liver abnormality. No gallstones, gallbladder wall thickening, or pericholecystic fluid. No biliary dilatation. Pancreas: No focal lesion. Normal pancreatic contour. No surrounding inflammatory changes. No main pancreatic ductal dilatation. Spleen: Normal in size without focal abnormality. Adrenals/Urinary Tract: No adrenal nodule bilaterally. Bilateral kidneys enhance symmetrically. No hydronephrosis. No hydroureter. The urinary bladder is unremarkable. Stomach/Bowel: Question gastric antrum wall thickening. No evidence of bowel wall thickening or dilatation. The appendix is enlarged in caliber measuring up to 14 mm. Associated appendiceal wall thickening. No appendicolith. Vascular/Lymphatic: Recanalized paraumbilical vein. No abdominal aorta or iliac aneurysm. Mild atherosclerotic plaque of the aorta and its branches. No abdominal, pelvic, or inguinal lymphadenopathy. Reproductive: Uterus and bilateral adnexa are unremarkable. Other: Moderate volume intraperitoneal free fluid. No intraperitoneal free gas. No organized fluid collection. Musculoskeletal: No abdominal wall hernia or abnormality. Diffuse subcutaneus  soft tissue edema. No suspicious lytic or blastic osseous lesions. No acute displaced fracture. Grade 1 anterolisthesis of L4 on L5. IMPRESSION: 1. Findings suggestive of acute appendicitis.  No appendicolith. 2. Visualized distal esophagus demonstrates wall thickening. Correlate with signs and symptoms of esophagitis. 3. Question gastric antral wall thickening. Finding may be related to gastritis or underlying ulceration. 4. Trace bilateral, right greater than left pleural effusions. 5. Hepatomegaly and cirrhosis with portal hypertension. No focal liver lesions identified. Please note that liver protocol enhanced MR and CT are the most sensitive tests for the screening detection of hepatocellular carcinoma in the high risk setting of cirrhosis. Electronically Signed   By: Morgane  Naveau M.D.   On: 10/11/2023 21:04        Scheduled Meds:  bisacodyl   10 mg Rectal Once   calcium  carbonate  1 tablet Oral TID WC   feeding supplement  237 mL Oral BID BM   folic acid   1 mg Oral Daily   furosemide  20 mg Oral Daily   gabapentin   100 mg Oral TID   lidocaine   2 patch Transdermal Q24H   metroNIDAZOLE   500 mg Oral Q12H   midodrine   15 mg Oral Q8H   multivitamin with minerals  1 tablet Oral Daily   nicotine   21 mg Transdermal Daily   pantoprazole   40 mg Oral BID   potassium chloride   40 mEq Oral Q4H   spironolactone  25 mg Oral Daily   sucralfate   1 g Oral Q6H   thiamine   100 mg Oral Daily   Continuous Infusions:  acetylcysteine 6.25 mg/kg/hr (10/13/23 0036)   cefTRIAXone  (ROCEPHIN )  IV 2 g (10/13/23 0804)     LOS: 10 days    Time spent: over 30 min     Meliton Monte, MD [facility]   To contact the attending provider between 7A-7P or the covering provider during after hours 7P-7A, please log into the  web site [email or web address] and access using universal [place] password for that web site. If you do not have the password, please call the hospital operator.  10/13/2023, 4:26 PM

## 2023-10-13 NOTE — Plan of Care (Signed)
  Problem: Education: Goal: Knowledge of General Education information will improve Description: Including pain rating scale, medication(s)/side effects and non-pharmacologic comfort measures Outcome: Progressing   Problem: Health Behavior/Discharge Planning: Goal: Ability to manage health-related needs will improve Outcome: Progressing   Problem: Clinical Measurements: Goal: Will remain free from infection Outcome: Progressing   Problem: Activity: Goal: Risk for activity intolerance will decrease Outcome: Progressing   Problem: Elimination: Goal: Will not experience complications related to bowel motility Outcome: Progressing   Problem: Pain Managment: Goal: General experience of comfort will improve and/or be controlled Outcome: Progressing   Problem: Safety: Goal: Ability to remain free from injury will improve Outcome: Progressing   Problem: Skin Integrity: Goal: Risk for impaired skin integrity will decrease Outcome: Progressing

## 2023-10-13 NOTE — Procedures (Signed)
 PROCEDURE SUMMARY:  ** Diagnostic paracentesis requested with a 100 ml max.   Successful ultrasound guided paracentesis from the right lower quadrant.  Yielded 100 ml of bright yellow fluid.  No immediate complications.  The patient tolerated the procedure well.   Specimen sent for labs.  EBL < 2 mL  If the patient eventually requires >/=2 paracenteses in a 30 day period, screening evaluation by the [facility] Interventional Radiology Portal Hypertension Clinic will be assessed.  Warren Dais, AGACNP-BC 10/13/2023, 12:35 PM

## 2023-10-13 NOTE — Progress Notes (Signed)
 Subjective: Waiting for the paracentesis.  Objective: Vital signs in last 24 hours: Temp:  [98.2 F (36.8 C)-99.9 F (37.7 C)] 98.8 F (37.1 C) (10/04 0420) Pulse Rate:  [90-105] 96 (10/04 0420) Resp:  [16-20] 20 (10/04 0420) BP: (84-118)/(51-78) 110/67 (10/04 0420) SpO2:  [96 %-99 %] 96 % (10/04 0420) Weight:  [71.8 kg] 71.8 kg (10/04 0500) Last BM Date : 10/11/23  Intake/Output from previous day: 10/03 0701 - 10/04 0700 In: 514 [P.O.:180; Blood:334] Out: 350 [Urine:350] Intake/Output this shift: No intake/output data recorded.  General appearance: alert and no distress Resp: clear to auscultation bilaterally Cardio: regular rate and rhythm GI: firm, +ascites Extremities: extremities normal, atraumatic, no cyanosis or edema  Lab Results: Recent Labs    10/11/23 0407 10/12/23 0151  WBC 5.6 5.5  HGB 7.9* 7.5*  HCT 23.7* 22.3*  PLT 81* 86*   BMET Recent Labs    10/10/23 1600 10/11/23 0407 10/12/23 0151  NA 125* 126* 128*  K 2.9* 4.7 3.9  CL 93* 96* 98  CO2 25 25 21*  GLUCOSE 120* 89 99  BUN <5* <5* <5*  CREATININE 0.44 0.41* 0.40*  CALCIUM  7.5* 8.0* 7.9*   LFT Recent Labs    10/12/23 0151  PROT 5.0*  ALBUMIN  2.8*  AST 78*  ALT 23  ALKPHOS 23*  BILITOT 13.0*   PT/INR Recent Labs    10/11/23 0407 10/12/23 0151  LABPROT 30.2* 33.5*  INR 2.7* 3.1*   Hepatitis Panel No results for input(s): HEPBSAG, HCVAB, HEPAIGM, HEPBIGM in the last 72 hours. C-Diff No results for input(s): CDIFFTOX in the last 72 hours. Fecal Lactopherrin No results for input(s): FECLLACTOFRN in the last 72 hours.  Studies/Results: VAS US  LOWER EXTREMITY VENOUS (DVT) Result Date: 10/12/2023  Lower Venous DVT Study Patient Name:  Joan Wood  Date of Exam:   10/12/2023 Medical Rec #: [id number]       Accession #:    [id number] Date of Birth: 1970-09-24        Patient Gender: F Patient Age:   53 years Exam Location:  [facility] Procedure:      VAS US   LOWER EXTREMITY VENOUS (DVT) Referring Phys: A POWELL JR --------------------------------------------------------------------------------  Indications: Perforated gastric ulcer repair 7.27.2025., and Edema. Other Indications: Smoker, alcohol abuse. Comparison Study: No prior exam. Performing Technologist: Edilia Elden Appl  Examination Guidelines: A complete evaluation includes B-mode imaging, spectral Doppler, color Doppler, and power Doppler as needed of all accessible portions of each vessel. Bilateral testing is considered an integral part of a complete examination. Limited examinations for reoccurring indications may be performed as noted. The reflux portion of the exam is performed with the patient in reverse Trendelenburg.  +---------+---------------+---------+-----------+----------+--------------+ RIGHT    CompressibilityPhasicitySpontaneityPropertiesThrombus Aging +---------+---------------+---------+-----------+----------+--------------+ CFV      Full           Yes      Yes                                 +---------+---------------+---------+-----------+----------+--------------+ SFJ      Full           Yes      Yes                                 +---------+---------------+---------+-----------+----------+--------------+ FV Prox  Full                                                        +---------+---------------+---------+-----------+----------+--------------+  FV Mid   Full                                                        +---------+---------------+---------+-----------+----------+--------------+ FV DistalFull                                                        +---------+---------------+---------+-----------+----------+--------------+ PFV      Full                                                        +---------+---------------+---------+-----------+----------+--------------+ POP      Full           Yes      Yes                                  +---------+---------------+---------+-----------+----------+--------------+ PTV      Full                                                        +---------+---------------+---------+-----------+----------+--------------+ PERO     Full                                                        +---------+---------------+---------+-----------+----------+--------------+   +----+---------------+---------+-----------+----------+--------------+ LEFTCompressibilityPhasicitySpontaneityPropertiesThrombus Aging +----+---------------+---------+-----------+----------+--------------+ CFV Full           Yes      Yes                                 +----+---------------+---------+-----------+----------+--------------+ SFJ Full           Yes      Yes                                 +----+---------------+---------+-----------+----------+--------------+    Summary: RIGHT: - There is no evidence of deep vein thrombosis in the lower extremity.  - No cystic structure found in the popliteal fossa.  LEFT: - No evidence of common femoral vein obstruction.   *See table(s) above for measurements and observations.    Preliminary    CT ABDOMEN PELVIS W CONTRAST Result Date: 10/11/2023 CLINICAL DATA:  Abdominal pain, acute, nonlocalized EXAM: CT ABDOMEN AND PELVIS WITH CONTRAST TECHNIQUE: Multidetector CT imaging of the abdomen and pelvis was performed using the standard protocol following bolus administration of intravenous contrast. RADIATION DOSE REDUCTION: This exam was performed according to the departmental dose-optimization program which includes automated exposure control, adjustment of the mA and/or kV according to patient size  and/or use of iterative reconstruction technique. CONTRAST:  75mL OMNIPAQUE  IOHEXOL  350 MG/ML SOLN COMPARISON:  CT abdomen pelvis 09/29/2023, CT abdomen pelvis 05/11/2023 FINDINGS: Lower chest: Trace bilateral, right greater than left pleural effusions. Bilateral lower  lobe passive atelectasis. Visualized distal esophagus demonstrates wall thickening. Hepatobiliary: The liver is enlarged measuring up to 19 cm. Nodular hepatic contour with heterogeneous hepatic parenchyma. No focal liver abnormality. No gallstones, gallbladder wall thickening, or pericholecystic fluid. No biliary dilatation. Pancreas: No focal lesion. Normal pancreatic contour. No surrounding inflammatory changes. No main pancreatic ductal dilatation. Spleen: Normal in size without focal abnormality. Adrenals/Urinary Tract: No adrenal nodule bilaterally. Bilateral kidneys enhance symmetrically. No hydronephrosis. No hydroureter. The urinary bladder is unremarkable. Stomach/Bowel: Question gastric antrum wall thickening. No evidence of bowel wall thickening or dilatation. The appendix is enlarged in caliber measuring up to 14 mm. Associated appendiceal wall thickening. No appendicolith. Vascular/Lymphatic: Recanalized paraumbilical vein. No abdominal aorta or iliac aneurysm. Mild atherosclerotic plaque of the aorta and its branches. No abdominal, pelvic, or inguinal lymphadenopathy. Reproductive: Uterus and bilateral adnexa are unremarkable. Other: Moderate volume intraperitoneal free fluid. No intraperitoneal free gas. No organized fluid collection. Musculoskeletal: No abdominal wall hernia or abnormality. Diffuse subcutaneus soft tissue edema. No suspicious lytic or blastic osseous lesions. No acute displaced fracture. Grade 1 anterolisthesis of L4 on L5. IMPRESSION: 1. Findings suggestive of acute appendicitis.  No appendicolith. 2. Visualized distal esophagus demonstrates wall thickening. Correlate with signs and symptoms of esophagitis. 3. Question gastric antral wall thickening. Finding may be related to gastritis or underlying ulceration. 4. Trace bilateral, right greater than left pleural effusions. 5. Hepatomegaly and cirrhosis with portal hypertension. No focal liver lesions identified. Please note that  liver protocol enhanced MR and CT are the most sensitive tests for the screening detection of hepatocellular carcinoma in the high risk setting of cirrhosis. Electronically Signed   By: Morgane  Naveau M.D.   On: 10/11/2023 21:04   ECHOCARDIOGRAM COMPLETE Result Date: 10/11/2023    ECHOCARDIOGRAM REPORT   Patient Name:   Joan Wood Date of Exam: 10/11/2023 Medical Rec #:  [id number]      Height:       62.0 in Accession #:    [id number]     Weight:       92.4 lb Date of Birth:  01/03/1971       BSA:          1.377 m Patient Age:    53 years       BP:           92/50 mmHg Patient Gender: F              HR:           92 bpm. Exam Location:  Inpatient Procedure: 2D Echo (Both Spectral and Color Flow Doppler were utilized during            procedure). Indications:    Other abnormalities of the heart  History:        Patient has no prior history of Echocardiogram examinations.                 Other abnormalities of the heart.  Sonographer:    Norleen Amour Referring Phys: [phone] A CALDWELL POWELL JR IMPRESSIONS  1. Left ventricular ejection fraction, by estimation, is 70 to 75%. The left ventricle has hyperdynamic function. The left ventricle has no regional wall motion abnormalities. Left ventricular diastolic parameters were normal.  2. Right ventricular systolic  function is normal. The right ventricular size is normal. Tricuspid regurgitation signal is inadequate for assessing PA pressure.  3. The mitral valve is grossly normal. Trivial mitral valve regurgitation.  4. The aortic valve is tricuspid. Aortic valve regurgitation is not visualized.  5. The inferior vena cava is normal in size with greater than 50% respiratory variability, suggesting right atrial pressure of 3 mmHg. Comparison(s): No prior Echocardiogram. FINDINGS  Left Ventricle: Left ventricular ejection fraction, by estimation, is 70 to 75%. The left ventricle has hyperdynamic function. The left ventricle has no regional wall motion abnormalities. The  left ventricular internal cavity size was normal in size. There is no left ventricular hypertrophy. Left ventricular diastolic parameters were normal. Right Ventricle: The right ventricular size is normal. No increase in right ventricular wall thickness. Right ventricular systolic function is normal. Tricuspid regurgitation signal is inadequate for assessing PA pressure. Left Atrium: Left atrial size was normal in size. Right Atrium: Right atrial size was normal in size. Pericardium: There is no evidence of pericardial effusion. Mitral Valve: The mitral valve is grossly normal. Trivial mitral valve regurgitation. Tricuspid Valve: The tricuspid valve is not well visualized. Tricuspid valve regurgitation is not demonstrated. Aortic Valve: The aortic valve is tricuspid. Aortic valve regurgitation is not visualized. Pulmonic Valve: The pulmonic valve was normal in structure. Pulmonic valve regurgitation is not visualized. Aorta: The aortic root and ascending aorta are structurally normal, with no evidence of dilitation. Venous: The inferior vena cava is normal in size with greater than 50% respiratory variability, suggesting right atrial pressure of 3 mmHg. IAS/Shunts: No atrial level shunt detected by color flow Doppler.  LEFT VENTRICLE PLAX 2D LVIDd:         4.30 cm     Diastology LVIDs:         2.00 cm     LV e' medial:    10.80 cm/s LV PW:         0.90 cm     LV E/e' medial:  8.4 LV IVS:        0.70 cm     LV e' lateral:   12.20 cm/s LVOT diam:     2.20 cm     LV E/e' lateral: 7.4 LV SV:         81 LV SV Index:   59 LVOT Area:     3.80 cm  LV Volumes (MOD) LV vol d, MOD A2C: 58.0 ml LV vol d, MOD A4C: 78.2 ml LV vol s, MOD A2C: 12.6 ml LV vol s, MOD A4C: 20.2 ml LV SV MOD A2C:     45.4 ml LV SV MOD A4C:     78.2 ml LV SV MOD BP:      54.8 ml RIGHT VENTRICLE             IVC RV Basal diam:  2.50 cm     IVC diam: 0.90 cm RV S prime:     11.30 cm/s TAPSE (M-mode): 1.9 cm LEFT ATRIUM             Index        RIGHT  ATRIUM          Index LA diam:        3.10 cm 2.25 cm/m   RA Area:     9.91 cm LA Vol (A2C):   16.5 ml 11.99 ml/m  RA Volume:   18.40 ml 13.37 ml/m LA Vol (A4C):   31.4 ml 22.81 ml/m LA Biplane  Vol: 23.9 ml 17.36 ml/m  AORTIC VALVE             PULMONIC VALVE LVOT Vmax:   103.00 cm/s PV Vmax:       0.84 m/s LVOT Vmean:  73.800 cm/s PV Peak grad:  2.8 mmHg LVOT VTI:    0.213 m  AORTA Ao Root diam: 3.10 cm Ao Asc diam:  2.50 cm MITRAL VALVE MV Area (PHT): 3.34 cm    SHUNTS MV Decel Time: 227 msec    Systemic VTI:  0.21 m MV E velocity: 90.70 cm/s  Systemic Diam: 2.20 cm MV A velocity: 74.40 cm/s MV E/A ratio:  1.22 Vinie Maxcy MD Electronically signed by Vinie Maxcy MD Signature Date/Time: 10/11/2023/11:26:23 AM    Final     Medications: Scheduled:  bisacodyl   10 mg Rectal Once   calcium  carbonate  1 tablet Oral TID WC   feeding supplement  237 mL Oral BID BM   folic acid   1 mg Oral Daily   furosemide  20 mg Oral Daily   gabapentin   100 mg Oral TID   lidocaine   2 patch Transdermal Q24H   metroNIDAZOLE   500 mg Oral Q12H   midodrine   15 mg Oral Q8H   multivitamin with minerals  1 tablet Oral Daily   nicotine   21 mg Transdermal Daily   pantoprazole   40 mg Oral BID   spironolactone  25 mg Oral Daily   sucralfate   1 g Oral Q6H   thiamine   100 mg Oral Daily   Continuous:  acetylcysteine 6.25 mg/kg/hr (10/13/23 0036)   cefTRIAXone  (ROCEPHIN )  IV 2 g (10/12/23 0951)    Assessment/Plan: 1) 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 2) Tense ascites. 3) Worsening coagulopathy. 4) Recent history of acute esophageal necrosis.   She remains stable.  The ascites is tense and it was ordered for a diagnostic paracentesis with her hypotension, however, her BP is better.  Clinically she remains stable.  Her creatinine is at 0.4 and she was started on diuretics.  She is also on a 2 gram sodium diet with 2L fluid restriction for her hyponatremia.  Plan: 1) Paracentesis. 2) Daily weights.   Most likely she will benefit with furosemide 40 mg and spironolactone 100 mg every day, if minimal fluid is removed.  Her weight will need to be monitored closely to prevent AKI.  There was no evidence of peripheral edema. 3) Continue with 2 gram diet with fluid restriction. 4) Maintain CTX .  LOS: 10 days   Lynnsey Barbara D 10/13/2023, 7:39 AM

## 2023-10-14 ENCOUNTER — Inpatient Hospital Stay (HOSPITAL_COMMUNITY)

## 2023-10-14 DIAGNOSIS — R112 Nausea with vomiting, unspecified: Secondary | ICD-10-CM | POA: Diagnosis not present

## 2023-10-14 DIAGNOSIS — D62 Acute posthemorrhagic anemia: Secondary | ICD-10-CM | POA: Diagnosis not present

## 2023-10-14 DIAGNOSIS — R197 Diarrhea, unspecified: Secondary | ICD-10-CM | POA: Diagnosis not present

## 2023-10-14 LAB — GLUCOSE, CAPILLARY
Glucose-Capillary: 100 mg/dL — ABNORMAL HIGH (ref 70–99)
Glucose-Capillary: 117 mg/dL — ABNORMAL HIGH (ref 70–99)
Glucose-Capillary: 127 mg/dL — ABNORMAL HIGH (ref 70–99)
Glucose-Capillary: 134 mg/dL — ABNORMAL HIGH (ref 70–99)
Glucose-Capillary: 83 mg/dL (ref 70–99)
Glucose-Capillary: 92 mg/dL (ref 70–99)
Glucose-Capillary: 98 mg/dL (ref 70–99)

## 2023-10-14 LAB — COMPREHENSIVE METABOLIC PANEL WITH GFR
ALT: 28 U/L (ref 0–44)
AST: 99 U/L — ABNORMAL HIGH (ref 15–41)
Albumin: 2.4 g/dL — ABNORMAL LOW (ref 3.5–5.0)
Alkaline Phosphatase: 34 U/L — ABNORMAL LOW (ref 38–126)
Anion gap: 10 (ref 5–15)
BUN: <5 mg/dL — ABNORMAL LOW (ref 6–20)
CO2: 23 mmol/L (ref 22–32)
Calcium: 8.6 mg/dL — ABNORMAL LOW (ref 8.9–10.3)
Chloride: 94 mmol/L — ABNORMAL LOW (ref 98–111)
Creatinine, Ser: 1.1 mg/dL — ABNORMAL HIGH (ref 0.44–1.00)
GFR, Estimated: 60 mL/min — ABNORMAL LOW (ref >60)
Glucose, Bld: 121 mg/dL — ABNORMAL HIGH (ref 70–99)
Potassium: 4.1 mmol/L (ref 3.5–5.1)
Sodium: 127 mmol/L — ABNORMAL LOW (ref 135–145)
Total Bilirubin: 12.5 mg/dL — ABNORMAL HIGH (ref 0.0–1.2)
Total Protein: 5.2 g/dL — ABNORMAL LOW (ref 6.5–8.1)

## 2023-10-14 LAB — PROTIME-INR
INR: 2.9 — ABNORMAL HIGH (ref 0.8–1.2)
Prothrombin Time: 31.4 s — ABNORMAL HIGH (ref 11.4–15.2)

## 2023-10-14 LAB — CBC WITH DIFFERENTIAL/PLATELET
Abs Immature Granulocytes: 0.06 K/uL (ref 0.00–0.07)
Basophils Absolute: 0 K/uL (ref 0.0–0.1)
Basophils Relative: 0 %
Eosinophils Absolute: 0 K/uL (ref 0.0–0.5)
Eosinophils Relative: 0 %
HCT: 27.6 % — ABNORMAL LOW (ref 36.0–46.0)
Hemoglobin: 9.6 g/dL — ABNORMAL LOW (ref 12.0–15.0)
Immature Granulocytes: 1 %
Lymphocytes Relative: 10 %
Lymphs Abs: 1.1 K/uL (ref 0.7–4.0)
MCH: 32.3 pg (ref 26.0–34.0)
MCHC: 34.8 g/dL (ref 30.0–36.0)
MCV: 92.9 fL (ref 80.0–100.0)
Monocytes Absolute: 1.6 K/uL — ABNORMAL HIGH (ref 0.1–1.0)
Monocytes Relative: 16 %
Neutro Abs: 7.4 K/uL (ref 1.7–7.7)
Neutrophils Relative %: 73 %
Platelets: 104 K/uL — ABNORMAL LOW (ref 150–400)
RBC: 2.97 MIL/uL — ABNORMAL LOW (ref 3.87–5.11)
RDW: 22.4 % — ABNORMAL HIGH (ref 11.5–15.5)
WBC: 10.1 K/uL (ref 4.0–10.5)
nRBC: 0 % (ref 0.0–0.2)

## 2023-10-14 LAB — MAGNESIUM: Magnesium: 1.7 mg/dL (ref 1.7–2.4)

## 2023-10-14 MED ORDER — WITCH HAZEL-GLYCERIN EX PADS
MEDICATED_PAD | CUTANEOUS | Status: AC | PRN
  Filled 2023-10-14 (×3): qty 100

## 2023-10-14 MED ORDER — ALBUMIN HUMAN 25 % IV SOLN
25.0000 g | Freq: Four times a day (QID) | INTRAVENOUS | Status: AC
  Administered 2023-10-14 (×2): 25 g via INTRAVENOUS
  Filled 2023-10-14 (×2): qty 100

## 2023-10-14 NOTE — Progress Notes (Addendum)
 PROGRESS NOTE    Joan Wood  FMW:[id number] DOB: [birth year] DOA: 10/03/2023 PCP: Edman Meade PEDLAR, FNP  Chief Complaint  Patient presents with   Emesis   Nausea   Diarrhea    Brief Narrative:   Joan Wood is Joan Wood 53 year old female with decompensated cirrhosis, PUD with recent gastric ulcer perforation s/p ex-lap, GERD, pancreatitis, OSA presented to [facility] with N/V/bloody diarrhea x 4 days. In the ED had coffee ground emesis. CTA GI neg for acute bleed, large ascites and new distal esophageal thickening. GI consulted for acute blood loss anemia with Hg 8.3>7. She was given PPI, started on octreotide  gtt, rocephin , vitamin K , pain and nausea control and admitted to hospitalist. GI was consulted to evaluate 9/25 AM. Hgb with drop from 8.3 >7 and 1 U PRBC ordered. GI saw patient and proceeded with upper endoscopy showing black discoloration of esophageal mucosa concerning for acute esophageal necrosis, portal hypertension, non-bleeding gastric and duodenal ulcers. Transferred to [facility] ICU for further management. She had paracentesis 10/04/23 with removed. GI following, continue PPI, octreotide , started clears, Hb remained stable transferred to [facility] service 10/06/23   Assessment & Plan:   Principal Problem:   GI bleed Active Problems:   Tobacco abuse   Alcohol use disorder   Hyponatremia   Nausea vomiting and diarrhea   Acute on chronic blood loss anemia   Liver cirrhosis (HCC)   Esophageal necrosis   Decompensated cirrhosis (HCC)   ABLA (acute blood loss anemia)  Decompensated alcoholic liver cirrhosis Volume Overload  Portal gastropathy Hyperbilirubinemia Ascites  Bilateral Pleural Effusions  Hypoalbuminemia  Thrombocytopenia  s/p paracentesis 9/25 3.3L removed. (SBP ruled out) 10/4 para without evidence of SBP, follow culture Consider repeat LVP S/p 5 days ceftriaxone   Repeat US  10/1 without sufficient ascites to tap She's volume overloaded, but diuresis limited by  hypotension - started midodrine  and albumin  - holding lasix/spironolactone today with bump in creatinine - follow Appreciate GI recommendations - trial of NAC Echo with preserved EF INR and bili progressively worsening  Meld 10/2 is 32, 52.6% 3 month mortality    Acute upper GI bleed Acute esophageal necrosis Acute blood loss anemia Patient has h/o PUD, gastric ulcer perforation s/p sx lap 07/2023. Now s/p EGD showing grade D esophagitis, black discoloration mucosa in esophagus concerning for acute esophageal necrosis, portal hypertensive gastropathy, non bleeding gastric ulcer with clean ulcer base with apparent sutures.  No bleeding duodenal ulcers with Joan Wood clean ulcer base.  No evidence of active bleeding or varices.   Transferred to [facility] given findings concerning for acute esophageal necrosis Gastrin level wnl  GI has now signed off, recommending carafate  x14 days, BID PPI until follow up with GI primary at [facility].  Needs ensure 2-3 times daily.  Currently on soft diet.     Acute Kidney Injury Creatinine doubled today Hold diuretics - will give albumin  Follow UA  Orthostatic Hypotension S/p 1 unit pRBC Follow orthostatics   Abdominal Pain CT with findings concerning for appendicitis.  Distal esophagus wall thickening, concerning for esophagitis.  Gastric antral wall thickening (gastritis or underlying ulceration).  Bilateral R/L effusions.  Hepatomegaly and cirrhosis with portal hypertension.  Manage symptomatically   Appendicitis Appreciate gen surgery, recommending abx -ok for diet she's high risk for surgery in setting of her cirrhosis  Right Lower Extremity Edema Right Lower Extremity Pain Follow LE US  - no evidence of deep vein thrombosis in lower extremity CT with moderate generalized subcutaneous edema throughout both visualized  size, similar to recent pelvic CT (anasarca/third spacing vs cellulitis) He's on abx which would cover cellulitis, though lower suspicion  for this Pain radiating all down leg, ? Neuropathic pain  Continue gabapentin    Hypervolemic Hyponatremia Gradually improving Holding diuretics today with doubling of creatine    Hypomagnesemia  Hypophosphatemia  Hypokalemia Follow, replace and follow    Tobacco abuse Continue nicotine  patch.   Alcohol abuse CIWA protocol. Continue thiamine , folate, MVT.  Severe Protein Calorie Malnutrition Body mass index is 28.95 kg/m. RD    DVT prophylaxis: SCD Code Status: full Family Communication: none Disposition:   Status is: Inpatient Remains inpatient appropriate because: need for continued inpatient care   Consultants:  GI  Procedures:   Upper endoscopy - grade D esophagitis, black discoloration mucosa in esophagus concerning for acute esophageal necrosis, portal hypertensive gastropathy, nonbleeding gastric ulcer with clean base with apparent sutures, non bleeding duodenal ulcers with clean base  Echo IMPRESSIONS     1. Left ventricular ejection fraction, by estimation, is 70 to 75%. The  left ventricle has hyperdynamic function. The left ventricle has no  regional wall motion abnormalities. Left ventricular diastolic parameters  were normal.   2. Right ventricular systolic function is normal. The right ventricular  size is normal. Tricuspid regurgitation signal is inadequate for assessing  PA pressure.   3. The mitral valve is grossly normal. Trivial mitral valve  regurgitation.   4. The aortic valve is tricuspid. Aortic valve regurgitation is not  visualized.   5. The inferior vena cava is normal in size with greater than 50%  respiratory variability, suggesting right atrial pressure of 3 mmHg.   Comparison(s): No prior Echocardiogram.   LE US  Summary:  RIGHT:      - There is no evidence of deep vein thrombosis in the lower extremity.    - No cystic structure found in the popliteal fossa.    LEFT:  - No evidence of common femoral vein  obstruction.  Antimicrobials:  Anti-infectives (From admission, onward)    Start     Dose/Rate Route Frequency Ordered Stop   10/12/23 1000  metroNIDAZOLE  (FLAGYL ) tablet 500 mg        500 mg Oral Every 12 hours 10/12/23 0734     10/12/23 0830  cefTRIAXone  (ROCEPHIN ) 2 g in sodium chloride  0.9 % 100 mL IVPB        2 g 200 mL/hr over 30 Minutes Intravenous Every 24 hours 10/12/23 0734     10/07/23 1000  fluconazole  (DIFLUCAN ) tablet 100 mg  Status:  Discontinued        100 mg Oral Daily 10/07/23 0853 10/07/23 0855   10/07/23 1000  fluconazole  (DIFLUCAN ) tablet 150 mg        150 mg Oral Daily 10/07/23 0855 10/07/23 1014   10/04/23 1000  cefTRIAXone  (ROCEPHIN ) 2 g in sodium chloride  0.9 % 100 mL IVPB        2 g 200 mL/hr over 30 Minutes Intravenous Every 24 hours 10/03/23 2154 10/08/23 0854   10/03/23 2015  cefTRIAXone  (ROCEPHIN ) 1 g in sodium chloride  0.9 % 100 mL IVPB        1 g 200 mL/hr over 30 Minutes Intravenous  Once 10/03/23 2010 10/03/23 2212       Subjective: Continued pain down R leg  Objective: Vitals:   10/14/23 0337 10/14/23 0859 10/14/23 1135 10/14/23 1519  BP: (!) 83/62 134/61 (!) 87/56 (!) 84/55  Pulse: 83 85 82 85  Resp: 17 18  19 16  Temp: 97.7 F (36.5 C) 98.3 F (36.8 C) 97.8 F (36.6 C) 98.5 F (36.9 C)  TempSrc:  Oral Oral Oral  SpO2: 99% 100% 98% 97%  Weight:      Height:        Intake/Output Summary (Last 24 hours) at 10/14/2023 1553 Last data filed at 10/14/2023 1300 Gross per 24 hour  Intake 480 ml  Output 100 ml  Net 380 ml   Filed Weights   10/09/23 0500 10/12/23 0500 10/13/23 0500  Weight: 41.9 kg 69.7 kg 71.8 kg    Examination:  General: No acute distress. Cardiovascular:RRR Lungs: unlabored Abdomen: distended, continued TTP Neurological: Alert and oriented 3. Moves all extremities 4. Cranial nerves II through XII grossly intact. Extremities: RLE edema greater than LLE - RLE with TTP to lateral thigh   Data Reviewed: I have  personally reviewed following labs and imaging studies  CBC: Recent Labs  Lab 10/10/23 0540 10/11/23 0407 10/12/23 0151 10/13/23 0852 10/14/23 0452  WBC 3.8* 5.6 5.5 9.1 10.1  NEUTROABS  --  3.1 3.1 6.7 7.4  HGB 7.9* 7.9* 7.5* 9.9* 9.6*  HCT 23.3* 23.7* 22.3* 28.3* 27.6*  MCV 96.3 96.7 98.7 92.2 92.9  PLT 62* 81* 86* 99* 104*    Basic Metabolic Panel: Recent Labs  Lab 10/08/23 0448 10/10/23 0540 10/10/23 1600 10/11/23 0407 10/12/23 0151 10/13/23 0852 10/14/23 0452  NA 125* 125* 125* 126* 128* 128* 127*  K 4.1 2.8* 2.9* 4.7 3.9 2.8* 4.1  CL 96* 93* 93* 96* 98 93* 94*  CO2 23 25 25 25  21* 23 23  GLUCOSE 78 127* 120* 89 99 103* 121*  BUN <5* <5* <5* <5* <5* <5* <5*  CREATININE 0.53 0.48 0.44 0.41* 0.40* 0.58 1.10*  CALCIUM  7.6* 7.6* 7.5* 8.0* 7.9* 8.1* 8.6*  MG 1.6*  --   --  1.6* 2.0 1.7 1.7  PHOS 2.5 2.1*  --  1.8* 5.0* 3.7  --     GFR: Estimated Creatinine Clearance: 54.3 mL/min (Thursa Emme) (by C-G formula based on SCr of 1.1 mg/dL (H)).  Liver Function Tests: Recent Labs  Lab 10/10/23 1600 10/11/23 0407 10/12/23 0151 10/13/23 0852 10/14/23 0452  AST  --  87* 78* 108* 99*  ALT  --  24 23 29 28   ALKPHOS  --  27* 23* 30* 34*  BILITOT  --  12.0* 13.0* 12.7* 12.5*  PROT  --  5.0* 5.0* 5.6* 5.2*  ALBUMIN  2.1* 2.6* 2.8* 2.8* 2.4*    CBG: Recent Labs  Lab 10/14/23 0057 10/14/23 0336 10/14/23 0900 10/14/23 1132 10/14/23 1520  GLUCAP 127* 134* 92 83 100*     Recent Results (from the past 240 hours)  Peritoneal fluid culture w Gram Stain     Status: None   Collection Time: 10/04/23  6:23 PM   Specimen: Peritoneal Washings; Peritoneal Fluid  Result Value Ref Range Status   Specimen Description PERITONEAL  Final   Special Requests NONE  Final   Gram Stain   Final    WBC PRESENT,BOTH PMN AND MONONUCLEAR NO ORGANISMS SEEN CYTOSPIN SMEAR    Culture   Final    NO GROWTH 3 DAYS Performed at [facility], [address], [place], [name] [address]     Report Status 10/08/2023 FINAL  Final  Body fluid culture w Gram Stain     Status: None (Preliminary result)   Collection Time: 10/13/23 11:37 AM   Specimen: Abdomen; Peritoneal Fluid  Result Value  Ref Range Status   Specimen Description PERITONEAL  Final   Special Requests ABDOMEN  Final   Gram Stain   Final    RARE WBC PRESENT, PREDOMINANTLY PMN NO ORGANISMS SEEN    Culture   Final    NO GROWTH 1 DAY Performed at [facility], [address], [place], [name] [address]    Report Status PENDING  Incomplete         Radiology Studies: CT FEMUR RIGHT WO CONTRAST Result Date: 10/14/2023 CLINICAL DATA:  Right lower extremity swelling and pain. History of decompensated alcoholic cirrhosis with ascites for which the patient underwent paracentesis yesterday. EXAM: CT OF THE LOWER RIGHT EXTREMITY WITHOUT CONTRAST TECHNIQUE: Multidetector CT imaging of the right thigh was performed according to the standard protocol. RADIATION DOSE REDUCTION: This exam was performed according to the departmental dose-optimization program which includes automated exposure control, adjustment of the mA and/or kV according to patient size and/or use of iterative reconstruction technique. COMPARISON:  Abdominopelvic CT 10/11/2023 and 10/03/2023. FINDINGS: Bones/Joint/Cartilage No evidence of acute fracture, dislocation or femoral head osteonecrosis. No significant arthropathy or effusions identified at the right hip or knee. Ligaments Suboptimally assessed by CT. Muscles and Tendons The right thigh muscles and tendons appear unremarkable. Soft tissues Moderate generalized subcutaneous edema throughout the right thigh, the proximal extent of which appears similar to recent pelvic CT. There appears to be similar subcutaneous edema within the left thigh, incompletely visualized. No deep soft tissue inflammatory changes, organized fluid collections, foreign bodies or soft tissue emphysema identified. Moderate to large  amount of pelvic ascites again noted. IMPRESSION: 1. Moderate generalized subcutaneous edema throughout both visualized size, similar to recent pelvic CT. This finding is nonspecific and suboptimally evaluated by noncontrast CT. Differential includes anasarca/third-spacing and cellulitis. Correlate clinically. 2. No deep soft tissue inflammatory changes, organized fluid collections, foreign bodies or soft tissue emphysema identified. 3. No acute or significant osseous findings. 4. Moderate to large amount of pelvic ascites again noted. Electronically Signed   By: Elsie Perone M.D.   On: 10/14/2023 14:25   US  Paracentesis Result Date: 10/13/2023 INDICATION: History of decompensated alcoholic cirrhosis with ascites. Interventional radiology asked to perform Quanda Pavlicek diagnostic paracentesis with Mirenda Baltazar 100 mL limit. EXAM: ULTRASOUND GUIDED PARACENTESIS MEDICATIONS: 1% lidocaine  10 ml . COMPLICATIONS: None immediate. PROCEDURE: Informed written consent was obtained from the patient after Jatara Huettner discussion of the risks, benefits and alternatives to treatment. Adaliz Dobis timeout was performed prior to the initiation of the procedure. Initial ultrasound scanning demonstrates Jaleen Grupp large amount of ascites within the right lower abdominal quadrant. The right lower abdomen was prepped and draped in the usual sterile fashion. 1% lidocaine  was used for local anesthesia. Following this, Izaan Kingbird 19 gauge, 7-cm, Yueh catheter was introduced. An ultrasound image was saved for documentation purposes. The paracentesis was performed. The catheter was removed and Giulian Goldring dressing was applied. The patient tolerated the procedure well without immediate post procedural complication. FINDINGS: Tinia Oravec total of approximately 100 ml of bright yellow fluid was removed. Samples were sent to the laboratory as requested by the clinical team. IMPRESSION: Successful ultrasound-guided paracentesis yielding 100 ml of peritoneal fluid. Procedure performed by: Warren Dais, NP PLAN: If the  patient eventually requires >/=2 paracenteses in Eyoel Throgmorton 30 day period, candidacy for formal evaluation by the [facility] Interventional Radiology Portal Hypertension Clinic will be assessed. Electronically Signed   By: Cordella Banner   On: 10/13/2023 13:06        Scheduled Meds:  bisacodyl   10 mg Rectal Once   calcium  carbonate  1 tablet Oral TID WC   feeding supplement  237 mL Oral BID BM   folic acid   1 mg Oral Daily   gabapentin   300 mg Oral TID   lidocaine   2 patch Transdermal Q24H   metroNIDAZOLE   500 mg Oral Q12H   midodrine   15 mg Oral Q8H   multivitamin with minerals  1 tablet Oral Daily   nicotine   21 mg Transdermal Daily   pantoprazole   40 mg Oral BID   sucralfate   1 g Oral Q6H   thiamine   100 mg Oral Daily   Continuous Infusions:  acetylcysteine 6.25 mg/kg/hr (10/13/23 0036)   cefTRIAXone  (ROCEPHIN )  IV 2 g (10/14/23 0831)     LOS: 11 days    Time spent: over 30 min     Meliton Monte, MD [facility]   To contact the attending provider between 7A-7P or the covering provider during after hours 7P-7A, please log into the web site [email or web address] and access using universal [place] password for that web site. If you do not have the password, please call the hospital operator.  10/14/2023, 3:53 PM

## 2023-10-14 NOTE — Progress Notes (Signed)
 Subjective: No new GI complaints.  Objective: Vital signs in last 24 hours: Temp:  [97.7 F (36.5 C)-98.9 F (37.2 C)] 98.3 F (36.8 C) (10/05 0859) Pulse Rate:  [83-88] 85 (10/05 0859) Resp:  [17-18] 18 (10/05 0859) BP: (83-134)/(54-62) 134/61 (10/05 0859) SpO2:  [97 %-100 %] 100 % (10/05 0859) Last BM Date : 10/11/23  Intake/Output from previous day: 10/04 0701 - 10/05 0700 In: -  Out: 100 [Urine:100] Intake/Output this shift: No intake/output data recorded.  General appearance: alert and fatigued GI: mildly tense  Lab Results: Recent Labs    10/12/23 0151 10/13/23 0852 10/14/23 0452  WBC 5.5 9.1 10.1  HGB 7.5* 9.9* 9.6*  HCT 22.3* 28.3* 27.6*  PLT 86* 99* 104*   BMET Recent Labs    10/12/23 0151 10/13/23 0852 10/14/23 0452  NA 128* 128* 127*  K 3.9 2.8* 4.1  CL 98 93* 94*  CO2 21* 23 23  GLUCOSE 99 103* 121*  BUN <5* <5* <5*  CREATININE 0.40* 0.58 1.10*  CALCIUM  7.9* 8.1* 8.6*   LFT Recent Labs    10/14/23 0452  PROT 5.2*  ALBUMIN  2.4*  AST 99*  ALT 28  ALKPHOS 34*  BILITOT 12.5*   PT/INR Recent Labs    10/13/23 0852 10/14/23 0452  LABPROT 31.3* 31.4*  INR 2.9* 2.9*   Hepatitis Panel No results for input(s): HEPBSAG, HCVAB, HEPAIGM, HEPBIGM in the last 72 hours. C-Diff No results for input(s): CDIFFTOX in the last 72 hours. Fecal Lactopherrin No results for input(s): FECLLACTOFRN in the last 72 hours.  Studies/Results: US  Paracentesis Result Date: 10/13/2023 INDICATION: History of decompensated alcoholic cirrhosis with ascites. Interventional radiology asked to perform a diagnostic paracentesis with a 100 mL limit. EXAM: ULTRASOUND GUIDED PARACENTESIS MEDICATIONS: 1% lidocaine  10 ml . COMPLICATIONS: None immediate. PROCEDURE: Informed written consent was obtained from the patient after a discussion of the risks, benefits and alternatives to treatment. A timeout was performed prior to the initiation of the procedure. Initial  ultrasound scanning demonstrates a large amount of ascites within the right lower abdominal quadrant. The right lower abdomen was prepped and draped in the usual sterile fashion. 1% lidocaine  was used for local anesthesia. Following this, a 19 gauge, 7-cm, Yueh catheter was introduced. An ultrasound image was saved for documentation purposes. The paracentesis was performed. The catheter was removed and a dressing was applied. The patient tolerated the procedure well without immediate post procedural complication. FINDINGS: A total of approximately 100 ml of bright yellow fluid was removed. Samples were sent to the laboratory as requested by the clinical team. IMPRESSION: Successful ultrasound-guided paracentesis yielding 100 ml of peritoneal fluid. Procedure performed by: Warren Dais, NP PLAN: If the patient eventually requires >/=2 paracenteses in a 30 day period, candidacy for formal evaluation by the [facility] Interventional Radiology Portal Hypertension Clinic will be assessed. Electronically Signed   By: Cordella Banner   On: 10/13/2023 13:06   VAS US  LOWER EXTREMITY VENOUS (DVT) Result Date: 10/12/2023  Lower Venous DVT Study Patient Name:  Joan Wood  Date of Exam:   10/12/2023 Medical Rec #: [id number]       Accession #:    [id number] Date of Birth: 09/30/1970        Patient Gender: F Patient Age:   53 years Exam Location:  [facility] Procedure:      VAS US  LOWER EXTREMITY VENOUS (DVT) Referring Phys: A POWELL JR --------------------------------------------------------------------------------  Indications: Perforated gastric ulcer repair 7.27.2025., and Edema. Other Indications:  Smoker, alcohol abuse. Comparison Study: No prior exam. Performing Technologist: Edilia Elden Appl  Examination Guidelines: A complete evaluation includes B-mode imaging, spectral Doppler, color Doppler, and power Doppler as needed of all accessible portions of each vessel. Bilateral testing is considered an  integral part of a complete examination. Limited examinations for reoccurring indications may be performed as noted. The reflux portion of the exam is performed with the patient in reverse Trendelenburg.  +---------+---------------+---------+-----------+----------+--------------+ RIGHT    CompressibilityPhasicitySpontaneityPropertiesThrombus Aging +---------+---------------+---------+-----------+----------+--------------+ CFV      Full           Yes      Yes                                 +---------+---------------+---------+-----------+----------+--------------+ SFJ      Full           Yes      Yes                                 +---------+---------------+---------+-----------+----------+--------------+ FV Prox  Full                                                        +---------+---------------+---------+-----------+----------+--------------+ FV Mid   Full                                                        +---------+---------------+---------+-----------+----------+--------------+ FV DistalFull                                                        +---------+---------------+---------+-----------+----------+--------------+ PFV      Full                                                        +---------+---------------+---------+-----------+----------+--------------+ POP      Full           Yes      Yes                                 +---------+---------------+---------+-----------+----------+--------------+ PTV      Full                                                        +---------+---------------+---------+-----------+----------+--------------+ PERO     Full                                                        +---------+---------------+---------+-----------+----------+--------------+   +----+---------------+---------+-----------+----------+--------------+  LEFTCompressibilityPhasicitySpontaneityPropertiesThrombus Aging  +----+---------------+---------+-----------+----------+--------------+ CFV Full           Yes      Yes                                 +----+---------------+---------+-----------+----------+--------------+ SFJ Full           Yes      Yes                                 +----+---------------+---------+-----------+----------+--------------+    Summary: RIGHT: - There is no evidence of deep vein thrombosis in the lower extremity.  - No cystic structure found in the popliteal fossa.  LEFT: - No evidence of common femoral vein obstruction.   *See table(s) above for measurements and observations.    Preliminary     Medications: Scheduled:  bisacodyl   10 mg Rectal Once   calcium  carbonate  1 tablet Oral TID WC   feeding supplement  237 mL Oral BID BM   folic acid   1 mg Oral Daily   gabapentin   300 mg Oral TID   lidocaine   2 patch Transdermal Q24H   metroNIDAZOLE   500 mg Oral Q12H   midodrine   15 mg Oral Q8H   multivitamin with minerals  1 tablet Oral Daily   nicotine   21 mg Transdermal Daily   pantoprazole   40 mg Oral BID   sucralfate   1 g Oral Q6H   thiamine   100 mg Oral Daily   Continuous:  acetylcysteine 6.25 mg/kg/hr (10/13/23 0036)   albumin  human     cefTRIAXone  (ROCEPHIN )  IV 2 g (10/14/23 0831)    Assessment/Plan: 1) Decompensated cirrhosis. 2) Ascites. 3) ETOH cirrhosis.   The patient does not have SBP as evidenced by her peritoneal tap.  It was a diagnostic tap and only 100 mL were removed.  Her creatinine was noted to be mildly elevated at 1.1.  She is on midodrine  and albumin .  The patient's diuretics will be increased and her weight and creatinine will need to be followed closely.  She does have some lower extremity edema and she should be able to tolerate the higher dosing of diuretics.  The patient was previously on diuretics yesterday, but it was discontinued for an unknown reason.  Plan: 1) Furosemide 40 mg every day. 2) Spironolactone 100 mg every day. 3)  Follow daily weight.  Goal is to maintain a 2 lbs weight loss during the first week of diuresis.  Her current weight is 71.8 kg. 4) [facility] will resume care in the AM.  LOS: 11 days   Abeeha Twist D 10/14/2023, 9:35 AM

## 2023-10-15 ENCOUNTER — Inpatient Hospital Stay (HOSPITAL_COMMUNITY)

## 2023-10-15 DIAGNOSIS — K7290 Hepatic failure, unspecified without coma: Secondary | ICD-10-CM | POA: Diagnosis not present

## 2023-10-15 DIAGNOSIS — D62 Acute posthemorrhagic anemia: Secondary | ICD-10-CM | POA: Diagnosis not present

## 2023-10-15 DIAGNOSIS — N179 Acute kidney failure, unspecified: Secondary | ICD-10-CM | POA: Diagnosis not present

## 2023-10-15 DIAGNOSIS — K7031 Alcoholic cirrhosis of liver with ascites: Secondary | ICD-10-CM | POA: Diagnosis not present

## 2023-10-15 DIAGNOSIS — K922 Gastrointestinal hemorrhage, unspecified: Secondary | ICD-10-CM | POA: Diagnosis not present

## 2023-10-15 DIAGNOSIS — K7682 Hepatic encephalopathy: Secondary | ICD-10-CM

## 2023-10-15 HISTORY — PX: IR PARACENTESIS: IMG2679

## 2023-10-15 LAB — COMPREHENSIVE METABOLIC PANEL WITH GFR
ALT: 29 U/L (ref 0–44)
AST: 87 U/L — ABNORMAL HIGH (ref 15–41)
Albumin: 2.8 g/dL — ABNORMAL LOW (ref 3.5–5.0)
Alkaline Phosphatase: 31 U/L — ABNORMAL LOW (ref 38–126)
Anion gap: 13 (ref 5–15)
BUN: <5 mg/dL — ABNORMAL LOW (ref 6–20)
CO2: 19 mmol/L — ABNORMAL LOW (ref 22–32)
Calcium: 9 mg/dL (ref 8.9–10.3)
Chloride: 93 mmol/L — ABNORMAL LOW (ref 98–111)
Creatinine, Ser: 1.4 mg/dL — ABNORMAL HIGH (ref 0.44–1.00)
GFR, Estimated: 45 mL/min — ABNORMAL LOW (ref >60)
Glucose, Bld: 102 mg/dL — ABNORMAL HIGH (ref 70–99)
Potassium: 4.1 mmol/L (ref 3.5–5.1)
Sodium: 125 mmol/L — ABNORMAL LOW (ref 135–145)
Total Bilirubin: 13.4 mg/dL — ABNORMAL HIGH (ref 0.0–1.2)
Total Protein: 5.6 g/dL — ABNORMAL LOW (ref 6.5–8.1)

## 2023-10-15 LAB — GLUCOSE, CAPILLARY
Glucose-Capillary: 118 mg/dL — ABNORMAL HIGH (ref 70–99)
Glucose-Capillary: 104 mg/dL — ABNORMAL HIGH (ref 70–99)
Glucose-Capillary: 83 mg/dL (ref 70–99)
Glucose-Capillary: 97 mg/dL (ref 70–99)
Glucose-Capillary: 113 mg/dL — ABNORMAL HIGH (ref 70–99)

## 2023-10-15 LAB — CBC WITH DIFFERENTIAL/PLATELET
Basophils Absolute: 0 K/uL (ref 0.0–0.1)
Basophils Relative: 0 %
Eosinophils Absolute: 0 K/uL (ref 0.0–0.5)
Eosinophils Relative: 0 %
HCT: 29.2 % — ABNORMAL LOW (ref 36.0–46.0)
Hemoglobin: 10 g/dL — ABNORMAL LOW (ref 12.0–15.0)
Lymphocytes Relative: 9 %
Lymphs Abs: 0.9 K/uL (ref 0.7–4.0)
MCH: 32.3 pg (ref 26.0–34.0)
MCHC: 34.2 g/dL (ref 30.0–36.0)
MCV: 94.2 fL (ref 80.0–100.0)
Monocytes Absolute: 0.9 K/uL (ref 0.1–1.0)
Monocytes Relative: 9 %
Neutro Abs: 8.4 K/uL — ABNORMAL HIGH (ref 1.7–7.7)
Neutrophils Relative %: 82 %
Platelets: 89 K/uL — ABNORMAL LOW (ref 150–400)
RBC: 3.1 MIL/uL — ABNORMAL LOW (ref 3.87–5.11)
RDW: 21.6 % — ABNORMAL HIGH (ref 11.5–15.5)
WBC: 10.3 K/uL (ref 4.0–10.5)
nRBC: 0 % (ref 0.0–0.2)

## 2023-10-15 LAB — GRAM STAIN

## 2023-10-15 LAB — URINALYSIS, ROUTINE W REFLEX MICROSCOPIC
Bilirubin Urine: NEGATIVE
Glucose, UA: NEGATIVE mg/dL
Hgb urine dipstick: NEGATIVE
Ketones, ur: NEGATIVE mg/dL
Leukocytes,Ua: NEGATIVE
Nitrite: NEGATIVE
Protein, ur: NEGATIVE mg/dL
Specific Gravity, Urine: 1.017 (ref 1.005–1.030)
pH: 5 (ref 5.0–8.0)

## 2023-10-15 LAB — BODY FLUID CELL COUNT WITH DIFFERENTIAL
Eos, Fluid: 0 %
Lymphs, Fluid: 36 %
Monocyte-Macrophage-Serous Fluid: 60 % (ref 50–90)
Neutrophil Count, Fluid: 4 % (ref 0–25)
Total Nucleated Cell Count, Fluid: 106 uL (ref 0–1000)

## 2023-10-15 LAB — PROTIME-INR
INR: 3 — ABNORMAL HIGH (ref 0.8–1.2)
Prothrombin Time: 32.5 s — ABNORMAL HIGH (ref 11.4–15.2)

## 2023-10-15 LAB — MAGNESIUM: Magnesium: 1.8 mg/dL (ref 1.7–2.4)

## 2023-10-15 LAB — PATHOLOGIST SMEAR REVIEW

## 2023-10-15 LAB — PHOSPHORUS: Phosphorus: 3.9 mg/dL (ref 2.5–4.6)

## 2023-10-15 LAB — AMMONIA: Ammonia: 62 umol/L — ABNORMAL HIGH (ref 9–35)

## 2023-10-15 MED ORDER — LACTULOSE 10 GM/15ML PO SOLN
30.0000 g | Freq: Three times a day (TID) | ORAL | Status: DC
  Administered 2023-10-15 – 2023-10-17 (×5): 30 g via ORAL
  Filled 2023-10-15 (×6): qty 60

## 2023-10-15 MED ORDER — VITAMIN K1 10 MG/ML IJ SOLN
5.0000 mg | Freq: Every day | INTRAVENOUS | Status: DC
  Administered 2023-10-15 – 2023-10-17 (×3): 5 mg via INTRAVENOUS
  Filled 2023-10-15 (×3): qty 0.5

## 2023-10-15 MED ORDER — LIDOCAINE HCL URETHRAL/MUCOSAL 2 % EX GEL
1.0000 | Freq: Once | CUTANEOUS | Status: AC
  Administered 2023-10-15: 1 via URETHRAL
  Filled 2023-10-15: qty 6

## 2023-10-15 MED ORDER — ALBUMIN HUMAN 25 % IV SOLN
25.0000 g | Freq: Four times a day (QID) | INTRAVENOUS | Status: AC
  Administered 2023-10-15: 12.5 g via INTRAVENOUS
  Administered 2023-10-15: 25 g via INTRAVENOUS
  Filled 2023-10-15 (×2): qty 100

## 2023-10-15 MED ORDER — BISACODYL 10 MG RE SUPP: 10.0000 mg | Freq: Every day | RECTAL | Status: DC | PRN

## 2023-10-15 MED ORDER — LIDOCAINE-EPINEPHRINE 1 %-1:100000 IJ SOLN
INTRAMUSCULAR | Status: AC
  Filled 2023-10-15: qty 1

## 2023-10-15 MED ORDER — FUROSEMIDE 10 MG/ML IJ SOLN
INTRAMUSCULAR | Status: AC
  Filled 2023-10-15: qty 4

## 2023-10-15 MED ORDER — ALBUMIN HUMAN 25 % IV SOLN
50.0000 g | Freq: Once | INTRAVENOUS | Status: AC
  Administered 2023-10-15: 50 g via INTRAVENOUS
  Filled 2023-10-15: qty 200

## 2023-10-15 MED ORDER — FUROSEMIDE 10 MG/ML IJ SOLN
20.0000 mg | Freq: Once | INTRAMUSCULAR | Status: AC
  Administered 2023-10-15: 20 mg via INTRAVENOUS
  Filled 2023-10-15: qty 4

## 2023-10-15 MED ORDER — CHLORHEXIDINE GLUCONATE CLOTH 2 % EX PADS
6.0000 | MEDICATED_PAD | Freq: Every day | CUTANEOUS | Status: DC
Start: 2023-10-15 — End: 2023-11-16
  Administered 2023-10-16 – 2023-11-15 (×31): 6 via TOPICAL

## 2023-10-15 MED ORDER — LACTULOSE 10 GM/15ML PO SOLN: 20.0000 g | Freq: Three times a day (TID) | ORAL | Status: DC

## 2023-10-15 MED ORDER — LACTULOSE ENEMA
300.0000 mL | Freq: Two times a day (BID) | ORAL | Status: AC
  Filled 2023-10-15 (×2): qty 300

## 2023-10-15 NOTE — Progress Notes (Signed)
 PROGRESS NOTE    Joan Wood  FMW:[id number] DOB: January 08, 1971 DOA: 10/03/2023 PCP: Edman Meade PEDLAR, FNP  Chief Complaint  Patient presents with   Emesis   Nausea   Diarrhea    Brief Narrative:   Joan Wood is Joan Wood 53 year old female with decompensated cirrhosis, PUD with recent gastric ulcer perforation s/p ex-lap, GERD, pancreatitis, OSA presented to [facility] with N/V/bloody diarrhea x 4 days. In the ED had coffee ground emesis. CTA GI neg for acute bleed, large ascites and new distal esophageal thickening. GI consulted for acute blood loss anemia with Hg 8.3>7. She was given PPI, started on octreotide  gtt, rocephin , vitamin K , pain and nausea control and admitted to hospitalist. GI was consulted to evaluate 9/25 AM. Hgb with drop from 8.3 >7 and 1 U PRBC ordered. GI saw patient and proceeded with upper endoscopy showing black discoloration of esophageal mucosa concerning for acute esophageal necrosis, portal hypertension, non-bleeding gastric and duodenal ulcers. Transferred to [facility] ICU for further management. She had paracentesis 10/04/23 with removed. GI following, continue PPI, octreotide , started clears, Hb remained stable transferred to [facility] service 10/06/23   Assessment & Plan:   Principal Problem:   GI bleed Active Problems:   Tobacco abuse   Alcohol use disorder   Hyponatremia   Nausea vomiting and diarrhea   Acute on chronic blood loss anemia   Liver cirrhosis (HCC)   Esophageal necrosis   Decompensated cirrhosis (HCC)   ABLA (acute blood loss anemia)  Decompensated Alcoholic Cirrhosis Hepatic Encephalopathy  Ascites  Bilateral Pleural Effusions  Hypoalbuminemia  Thrombocytopenia  S/p 5 days ceftriaxone   s/p paracentesis 9/25 3.3L removed. (SBP ruled out) Repeat US  10/1 without sufficient ascites to tap 10/4 para without evidence of SBP, follow culture Repeat paracentesis 10/6 with 4 L off, will give 25 g/L albumin  today per discussion with renal She's remains  volume overloaded - appreciate renal assistance with volume/diuretics (IV lasix x1 today) Midodrine  for hypotension Asterixis on exam today, lactulose for hepatic encephalopathy (ammonia pending) Appreciate GI recommendations - s/p trial of NAC Echo with preserved EF INR and bili progressively worsening  Meld 10/2 is 32, 52.6% 3 month mortality    Acute Kidney Injury Volume Overload  Creatinine doubled 10/5 Progressive worsening to 1.4 today Renal consult in setting of cirrhosis above - considering addition of octreotide  based on continued trend and urine sodium Place foley Volume per renal - dose of IV lasix today Follow UA, urine sodium   Acute upper GI bleed Acute esophageal necrosis Acute blood loss anemia Patient has h/o PUD, gastric ulcer perforation s/p sx lap 07/2023. Now s/p EGD showing grade D esophagitis, black discoloration mucosa in esophagus concerning for acute esophageal necrosis, portal hypertensive gastropathy, non bleeding gastric ulcer with clean ulcer base with apparent sutures.  No bleeding duodenal ulcers with Raelle Chambers clean ulcer base.  No evidence of active bleeding or varices.   Transferred to [facility] given findings concerning for acute esophageal necrosis Gastrin level wnl  GI recommending carafate  x14 days, BID PPI until follow up with GI primary at [facility].  Needs ensure 2-3 times daily.    Orthostatic Hypotension S/p 1 unit pRBC Follow orthostatics   Abdominal Pain CT with findings concerning for appendicitis.  Distal esophagus wall thickening, concerning for esophagitis.  Gastric antral wall thickening (gastritis or underlying ulceration).  Bilateral R/L effusions.  Hepatomegaly and cirrhosis with portal hypertension.  Manage symptomatically   Appendicitis Appreciate gen surgery, recommending abx -ok for diet she's  high risk for surgery in setting of her cirrhosis  Right Lower Extremity Edema Right Lower Extremity Pain Follow LE US  - no evidence of  deep vein thrombosis in lower extremity CT with moderate generalized subcutaneous edema throughout both visualized size, similar to recent pelvic CT (anasarca/third spacing vs cellulitis) She's on abx which would cover cellulitis, though lower suspicion for this Pain radiating all down leg, ? Neuropathic pain, Continue gabapentin    Hypervolemic Hyponatremia fluctuating Appreciate renal assistance with volume and hyponatremia   Hypomagnesemia  Hypophosphatemia  Hypokalemia Follow, replace and follow    Tobacco abuse Continue nicotine  patch.   Alcohol abuse CIWA protocol. Continue thiamine , folate, MVT.  Severe Protein Calorie Malnutrition Body mass index is 28.95 kg/m. RD    DVT prophylaxis: SCD Code Status: full Family Communication: none Disposition:   Status is: Inpatient Remains inpatient appropriate because: need for continued inpatient care   Consultants:  GI  Procedures:   Upper endoscopy - grade D esophagitis, black discoloration mucosa in esophagus concerning for acute esophageal necrosis, portal hypertensive gastropathy, nonbleeding gastric ulcer with clean base with apparent sutures, non bleeding duodenal ulcers with clean base  Echo IMPRESSIONS     1. Left ventricular ejection fraction, by estimation, is 70 to 75%. The  left ventricle has hyperdynamic function. The left ventricle has no  regional wall motion abnormalities. Left ventricular diastolic parameters  were normal.   2. Right ventricular systolic function is normal. The right ventricular  size is normal. Tricuspid regurgitation signal is inadequate for assessing  PA pressure.   3. The mitral valve is grossly normal. Trivial mitral valve  regurgitation.   4. The aortic valve is tricuspid. Aortic valve regurgitation is not  visualized.   5. The inferior vena cava is normal in size with greater than 50%  respiratory variability, suggesting right atrial pressure of 3 mmHg.    Comparison(s): No prior Echocardiogram.   LE US  Summary:  RIGHT:      - There is no evidence of deep vein thrombosis in the lower extremity.    - No cystic structure found in the popliteal fossa.    LEFT:  - No evidence of common femoral vein obstruction.  Antimicrobials:  Anti-infectives (From admission, onward)    Start     Dose/Rate Route Frequency Ordered Stop   10/12/23 1000  metroNIDAZOLE  (FLAGYL ) tablet 500 mg        500 mg Oral Every 12 hours 10/12/23 0734     10/12/23 0830  cefTRIAXone  (ROCEPHIN ) 2 g in sodium chloride  0.9 % 100 mL IVPB        2 g 200 mL/hr over 30 Minutes Intravenous Every 24 hours 10/12/23 0734     10/07/23 1000  fluconazole  (DIFLUCAN ) tablet 100 mg  Status:  Discontinued        100 mg Oral Daily 10/07/23 0853 10/07/23 0855   10/07/23 1000  fluconazole  (DIFLUCAN ) tablet 150 mg        150 mg Oral Daily 10/07/23 0855 10/07/23 1014   10/04/23 1000  cefTRIAXone  (ROCEPHIN ) 2 g in sodium chloride  0.9 % 100 mL IVPB        2 g 200 mL/hr over 30 Minutes Intravenous Every 24 hours 10/03/23 2154 10/08/23 0854   10/03/23 2015  cefTRIAXone  (ROCEPHIN ) 1 g in sodium chloride  0.9 % 100 mL IVPB        1 g 200 mL/hr over 30 Minutes Intravenous  Once 10/03/23 2010 10/03/23 2212       Subjective:  Continued abdominal pain and pain down legs  Objective: Vitals:   10/14/23 2352 10/15/23 0339 10/15/23 0818 10/15/23 1214  BP: (!) 91/58 (!) 90/59 (!) 86/56 (!) 100/57  Pulse: 83 88 91 92  Resp: 16 15 18 18   Temp: 98.3 F (36.8 C) 98.2 F (36.8 C) 97.6 F (36.4 C) 97.8 F (36.6 C)  TempSrc: Oral Oral Oral Oral  SpO2: 100% 91% 94% 100%  Weight:      Height:        Intake/Output Summary (Last 24 hours) at 10/15/2023 1501 Last data filed at 10/14/2023 1600 Gross per 24 hour  Intake 528.75 ml  Output --  Net 528.75 ml   Filed Weights   10/09/23 0500 10/12/23 0500 10/13/23 0500  Weight: 41.9 kg 69.7 kg 71.8 kg    Examination:  General: No acute  distress. Cardiovascular: RRR Lungs: unlabored Abdomen: tight distended abdomen, diffusely TTP Neurological: asterixis, moving all extremities, following commands Skin: Warm and dry. No rashes or lesions. Extremities: bilateral (R>L) LE edema   Data Reviewed: I have personally reviewed following labs and imaging studies  CBC: Recent Labs  Lab 10/11/23 0407 10/12/23 0151 10/13/23 0852 10/14/23 0452 10/15/23 0528  WBC 5.6 5.5 9.1 10.1 10.3  NEUTROABS 3.1 3.1 6.7 7.4 8.4*  HGB 7.9* 7.5* 9.9* 9.6* 10.0*  HCT 23.7* 22.3* 28.3* 27.6* 29.2*  MCV 96.7 98.7 92.2 92.9 94.2  PLT 81* 86* 99* 104* 89*    Basic Metabolic Panel: Recent Labs  Lab 10/10/23 0540 10/10/23 1600 10/11/23 0407 10/12/23 0151 10/13/23 0852 10/14/23 0452 10/15/23 0528  NA 125*   < > 126* 128* 128* 127* 125*  K 2.8*   < > 4.7 3.9 2.8* 4.1 4.1  CL 93*   < > 96* 98 93* 94* 93*  CO2 25   < > 25 21* 23 23 19*  GLUCOSE 127*   < > 89 99 103* 121* 102*  BUN <5*   < > <5* <5* <5* <5* <5*  CREATININE 0.48   < > 0.41* 0.40* 0.58 1.10* 1.40*  CALCIUM  7.6*   < > 8.0* 7.9* 8.1* 8.6* 9.0  MG  --   --  1.6* 2.0 1.7 1.7 1.8  PHOS 2.1*  --  1.8* 5.0* 3.7  --  3.9   < > = values in this interval not displayed.    GFR: Estimated Creatinine Clearance: 42.6 mL/min (Jariyah Hackley) (by C-G formula based on SCr of 1.4 mg/dL (H)).  Liver Function Tests: Recent Labs  Lab 10/11/23 0407 10/12/23 0151 10/13/23 0852 10/14/23 0452 10/15/23 0528  AST 87* 78* 108* 99* 87*  ALT 24 23 29 28 29   ALKPHOS 27* 23* 30* 34* 31*  BILITOT 12.0* 13.0* 12.7* 12.5* 13.4*  PROT 5.0* 5.0* 5.6* 5.2* 5.6*  ALBUMIN  2.6* 2.8* 2.8* 2.4* 2.8*    CBG: Recent Labs  Lab 10/14/23 1953 10/14/23 2353 10/15/23 0340 10/15/23 0819 10/15/23 1215  GLUCAP 117* 98 118* 104* 83     Recent Results (from the past 240 hours)  Body fluid culture w Gram Stain     Status: None (Preliminary result)   Collection Time: 10/13/23 11:37 AM   Specimen: Abdomen;  Peritoneal Fluid  Result Value Ref Range Status   Specimen Description PERITONEAL  Final   Special Requests ABDOMEN  Final   Gram Stain   Final    RARE WBC PRESENT, PREDOMINANTLY PMN NO ORGANISMS SEEN    Culture   Final    NO GROWTH 2  DAYS Performed at [facility], [address] [place], [name] [address]    Report Status PENDING  Incomplete         Radiology Studies: IR Paracentesis Result Date: 10/15/2023 INDICATION: 53 year old female with alcoholic cirrhosis and ascites. Request for therapeutic and diagnostic paracentesis up to 4 L. EXAM: ULTRASOUND GUIDED  PARACENTESIS MEDICATIONS: 10 mL 1% lidocaine  COMPLICATIONS: None immediate. PROCEDURE: Informed written consent was obtained from the patient after Givanni Staron discussion of the risks, benefits and alternatives to treatment. Cassandra Mcmanaman timeout was performed prior to the initiation of the procedure. Initial ultrasound scanning demonstrates Shaunn Tackitt large amount of ascites within the right lower abdominal quadrant. The right lower abdomen was prepped and draped in the usual sterile fashion. 1% lidocaine  was used for local anesthesia. Following this, Hanif Radin 5 [place] Cook centesis catheter was introduced. An ultrasound image was saved for documentation purposes. The paracentesis was performed. The catheter was removed and Kadajah Kjos dressing was applied. The patient tolerated the procedure well without immediate post procedural complication. FINDINGS: Demetruis Depaul total of approximately 4 L of hazy yellow fluid was removed. Samples were sent to the laboratory as requested by the clinical team. IMPRESSION: Successful ultrasound-guided paracentesis yielding 4 liters of peritoneal fluid. PLAN: If the patient eventually requires >/=2 paracenteses in Gabriele Zwilling 30 day period, candidacy for formal evaluation by the [facility] Interventional Radiology Portal Hypertension Clinic will be assessed. Performed by: Aimee Han, PA-C. Electronically Signed   By: [name].  Shick M.D.   On: 10/15/2023 14:54   CT  FEMUR RIGHT WO CONTRAST Result Date: 10/14/2023 CLINICAL DATA:  Right lower extremity swelling and pain. History of decompensated alcoholic cirrhosis with ascites for which the patient underwent paracentesis yesterday. EXAM: CT OF THE LOWER RIGHT EXTREMITY WITHOUT CONTRAST TECHNIQUE: Multidetector CT imaging of the right thigh was performed according to the standard protocol. RADIATION DOSE REDUCTION: This exam was performed according to the departmental dose-optimization program which includes automated exposure control, adjustment of the mA and/or kV according to patient size and/or use of iterative reconstruction technique. COMPARISON:  Abdominopelvic CT 10/11/2023 and 10/03/2023. FINDINGS: Bones/Joint/Cartilage No evidence of acute fracture, dislocation or femoral head osteonecrosis. No significant arthropathy or effusions identified at the right hip or knee. Ligaments Suboptimally assessed by CT. Muscles and Tendons The right thigh muscles and tendons appear unremarkable. Soft tissues Moderate generalized subcutaneous edema throughout the right thigh, the proximal extent of which appears similar to recent pelvic CT. There appears to be similar subcutaneous edema within the left thigh, incompletely visualized. No deep soft tissue inflammatory changes, organized fluid collections, foreign bodies or soft tissue emphysema identified. Moderate to large amount of pelvic ascites again noted. IMPRESSION: 1. Moderate generalized subcutaneous edema throughout both visualized size, similar to recent pelvic CT. This finding is nonspecific and suboptimally evaluated by noncontrast CT. Differential includes anasarca/third-spacing and cellulitis. Correlate clinically. 2. No deep soft tissue inflammatory changes, organized fluid collections, foreign bodies or soft tissue emphysema identified. 3. No acute or significant osseous findings. 4. Moderate to large amount of pelvic ascites again noted. Electronically Signed   By:  Elsie Perone M.D.   On: 10/14/2023 14:25        Scheduled Meds:  bisacodyl   10 mg Rectal Once   calcium  carbonate  1 tablet Oral TID WC   Chlorhexidine  Gluconate Cloth  6 each Topical Daily   feeding supplement  237 mL Oral BID BM   folic acid   1 mg Oral Daily   furosemide  20 mg Intravenous Once  gabapentin   300 mg Oral TID   lactulose  30 g Oral TID   lidocaine   2 patch Transdermal Q24H   lidocaine   1 Application Urethral Once   metroNIDAZOLE   500 mg Oral Q12H   midodrine   15 mg Oral Q8H   multivitamin with minerals  1 tablet Oral Daily   nicotine   21 mg Transdermal Daily   pantoprazole   40 mg Oral BID   sucralfate   1 g Oral Q6H   thiamine   100 mg Oral Daily   Continuous Infusions:  acetylcysteine 6.25 mg/kg/hr (10/13/23 0036)   albumin  human 12.5 g (10/15/23 0858)   albumin  human     cefTRIAXone  (ROCEPHIN )  IV 2 g (10/15/23 0849)   phytonadione  (VITAMIN K ) 5 mg in dextrose  5 % 50 mL IVPB       LOS: 12 days    Time spent: over 30 min     Meliton Monte, MD [facility]   To contact the attending provider between 7A-7P or the covering provider during after hours 7P-7A, please log into the web site [email or web address] and access using universal [place] password for that web site. If you do not have the password, please call the hospital operator.  10/15/2023, 3:01 PM

## 2023-10-15 NOTE — Procedures (Signed)
 PROCEDURE SUMMARY:  Successful image-guided paracentesis from the right lower abdomen.  Yielded 4 liters of hazy yellow fluid.  No immediate complications.  EBL = trace. Patient tolerated well.   Specimen was  sent for labs.  Please see imaging section of Epic for full dictation.  If the patient eventually requires >/=2 paracenteses in a 30 day period, screening evaluation by the [facility] Interventional Radiology Portal Hypertension Clinic will be assessed.  Paracentesis performed on 10/4 was diagnostic purpose only, only 100 mL of fluid was removed.   Noreen Mackintosh H Kenderick Kobler PA-C 10/15/2023 2:16 PM

## 2023-10-15 NOTE — Progress Notes (Addendum)
 Patient ID: Joan Wood, female   DOB: 09-25-1970, 53 y.o.   MRN: [id number]    Progress Note   Subjective  Day # 11 CC; decompensated EtOH induced cirrhosis, somewhat refractory ascites, history of SBP admitted with acute GI bleed.  Recent history of perforated gastric ulcer status post omental patch repair July 2025 Diagnosed with probable acute appendicitis 10/12/2023-not felt to be surgical candidate  NAC -day #3 Diuretics now on hold Midodrine  15 mg 3 times daily Albumin  every 6 RocephinIV/ Po Metronidazole   Diagnostic paracentesis 10/4-no counts reviewed, not consistent with SBP Preliminary culture no growth x 2 days  Labs today-pro time 32.5/INR 3.0 WBC 10.3/hemoglobin 10/hematocrit 29.2/platelets 89 stable Sodium 125/potassium 4.1/BUN 5/creatinine 1.4 (up from 0.58 to 48 hours ago) T. bili 13.4/AST 87/ALT 29/alk phos 31  MELD 3.0: 36 at 10/15/2023  5:28 AM MELD-Na: 35 at 10/15/2023  5:28 AM Calculated from: Serum Creatinine: 1.4 mg/dL at 89/03/7972  4:71 AM Serum Sodium: 125 mmol/L at 10/15/2023  5:28 AM Total Bilirubin: 13.4 mg/dL at 89/03/7972  4:71 AM Serum Albumin : 2.8 g/dL at 89/03/7972  4:71 AM INR(ratio): 3 at 10/15/2023  5:28 AM Age at listing (hypothetical): 53 years Sex: Female at 10/15/2023  5:28 AM  Patient says her esophageal symptoms have been continually improving she only has some mild discomfort with swallowing at this point is not having any ongoing esophageal pain.  No nausea or vomiting, no melena.  She says she has not really had a bowel movement over the past few days. She is complaining of a lot of gas pain in her abdomen, when she can pass some gas her pain is partially relieved for a short period of time.  She is feeling a lot of heaviness in her lower abdomen, and ongoing pain in her abdomen which she localizes more to the right lower quadrant at this time where she says it also feels very warm. She is able to answer questions, but is forgetful and has  to be redirected        Objective   Vital signs in last 24 hours: Temp:  [97.6 F (36.4 C)-98.5 F (36.9 C)] 97.6 F (36.4 C) (10/06 0818) Pulse Rate:  [83-92] 92 (10/06 1214) Resp:  [15-18] 18 (10/06 1214) BP: (82-100)/(49-59) 100/57 (10/06 1214) SpO2:  [91 %-100 %] 100 % (10/06 1214) Last BM Date : 10/14/23 General:    older white female in NAD Heart:  Regular rate and rhythm; no murmurs Lungs: Respirations even and unlabored, lungs CTA bilaterally Abdomen:  Soft, tensely distended, there is a lot of fluid noted across the lower abdominal wall into the bilateral flanks.  She has rather diffuse tenderness in the abdomen more prominent in the right lower quadrant no definite rebound, bowel sounds are present Extremities: Right thigh edematous greater than left thigh but both with 2+ edema Neurologic:  Alert and oriented x 3, build to answer short questions, asterixis present Psych:  Cooperative. Normal mood and affect.  Intake/Output from previous day: 10/05 0701 - 10/06 0700 In: 1208.8 [P.O.:480; I.V.:428.8; IV Piggyback:300] Out: -  Intake/Output this shift: No intake/output data recorded.  Lab Results: Recent Labs    10/13/23 0852 10/14/23 0452 10/15/23 0528  WBC 9.1 10.1 10.3  HGB 9.9* 9.6* 10.0*  HCT 28.3* 27.6* 29.2*  PLT 99* 104* 89*   BMET Recent Labs    10/13/23 0852 10/14/23 0452 10/15/23 0528  NA 128* 127* 125*  K 2.8* 4.1 4.1  CL 93* 94* 93*  CO2 23 23 19*  GLUCOSE 103* 121* 102*  BUN <5* <5* <5*  CREATININE 0.58 1.10* 1.40*  CALCIUM  8.1* 8.6* 9.0   LFT Recent Labs    10/15/23 0528  PROT 5.6*  ALBUMIN  2.8*  AST 87*  ALT 29  ALKPHOS 31*  BILITOT 13.4*   PT/INR Recent Labs    10/14/23 0452 10/15/23 0528  LABPROT 31.4* 32.5*  INR 2.9* 3.0*    Studies/Results: CT FEMUR RIGHT WO CONTRAST Result Date: 10/14/2023 CLINICAL DATA:  Right lower extremity swelling and pain. History of decompensated alcoholic cirrhosis with ascites for  which the patient underwent paracentesis yesterday. EXAM: CT OF THE LOWER RIGHT EXTREMITY WITHOUT CONTRAST TECHNIQUE: Multidetector CT imaging of the right thigh was performed according to the standard protocol. RADIATION DOSE REDUCTION: This exam was performed according to the departmental dose-optimization program which includes automated exposure control, adjustment of the mA and/or kV according to patient size and/or use of iterative reconstruction technique. COMPARISON:  Abdominopelvic CT 10/11/2023 and 10/03/2023. FINDINGS: Bones/Joint/Cartilage No evidence of acute fracture, dislocation or femoral head osteonecrosis. No significant arthropathy or effusions identified at the right hip or knee. Ligaments Suboptimally assessed by CT. Muscles and Tendons The right thigh muscles and tendons appear unremarkable. Soft tissues Moderate generalized subcutaneous edema throughout the right thigh, the proximal extent of which appears similar to recent pelvic CT. There appears to be similar subcutaneous edema within the left thigh, incompletely visualized. No deep soft tissue inflammatory changes, organized fluid collections, foreign bodies or soft tissue emphysema identified. Moderate to large amount of pelvic ascites again noted. IMPRESSION: 1. Moderate generalized subcutaneous edema throughout both visualized size, similar to recent pelvic CT. This finding is nonspecific and suboptimally evaluated by noncontrast CT. Differential includes anasarca/third-spacing and cellulitis. Correlate clinically. 2. No deep soft tissue inflammatory changes, organized fluid collections, foreign bodies or soft tissue emphysema identified. 3. No acute or significant osseous findings. 4. Moderate to large amount of pelvic ascites again noted. Electronically Signed   By: Elsie Perone M.D.   On: 10/14/2023 14:25       Assessment / Plan:    #14 53 year old female admitted 11 days ago with an acute GI bleed/hemoglobin of 5 found on  EGD to have grade D esophagitis with concern for component of esophageal necrosis in the lower one third, also with severe portal gastropathy throughout the entire stomach, 1 nonbleeding cratered gastric ulcer in the gastric antrum and multiple nonbleeding superficial duodenal ulcers .  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  NAC initiated 10/12/2022 after further bump in T. bili, and INR  #3 acute kidney injury with doubling of creatinine over the past 24 hours-rule out HRS Diuretics on hold Already on midodrine  15mg   3 times daily Albumin  every 6 hours started If Urine sodium is low, need to start octreotide -nephrology consulting  #4 somewhat refractory ascites, ascites is loculated and have had a difficult time with paracenteses.  Paracentesis over the weekend with removal of only 100 cc/diagnostic, no evidence for SBP  #5 probable acute appendicitis diagnosed 10/10/2008 03/2023-on ceftriaxone   Patient continues to complain of diffuse abdominal pain which is worse in her right lower quadrant where she  says she feels very warm.  I think is part of her pain is from abdominal wall edema, no evidence of definite cellulitis on exam.  May need to reimage the appendix  #6 hepatic encephalopathy-continue lactulose, no Xifaxan due to concern for rash after that was initiated-Will increase dose of lactulose #7 anemia acute on chronic secondary to acute GI blood loss, currently stable #8 history of perforated gastric ulcer requiring omental patch July 2025  #9 hyponatremia #10 EtOH use disorder-abstinent over the past month   Plans; as outlined  above Will discuss repeat CT abdomen and pelvis with Dr. Leigh Will continue NAC, would not be a candidate for prednisolone if that were to be considered until we have completely ruled out any component of appendicitis  Added vitamin K  5 mg IV daily x 3 days given rising INR   Principal Problem:   GI bleed Active Problems:   Alcohol use disorder   Hyponatremia   Tobacco abuse   Nausea vomiting and diarrhea   Acute on chronic blood loss anemia   Liver cirrhosis (HCC)   Esophageal necrosis   Decompensated cirrhosis (HCC)   ABLA (acute blood loss anemia)     LOS: 12 days   Amy Esterwood PA-C 10/15/2023, 12:43 PM   ATTENDING ADDENDUM 53 year old female with alcoholic cirrhosis, admitted 11 days ago for anemia with an upper GI bleed, EGD showing severe esophagitis, suspected necrosis, with severe portal hypertensive gastritis and gastric ulcer and duodenal ulcers.  Unfortunately her course has been complicated by decompensated cirrhosis (jaundice) and AKI.  Now in oliguric renal failure with abrupt rise in creatinine.  She has had progressive jaundice with bilirubin of 13's.  She previously had bilirubin in the 3s back on September 27, her labs were not checked for a few days and since October 2 she has had jaundice with bilirubin's of 12-13's.  She has had worsening coagulopathy with INR now in the threes, vitamin K  given.  Her last drink was over a month ago, suspect she may more likely just have progressive decompensation in the setting of acute illness although being treated for alcoholic hepatitis.  Placed on NAC and so far this has not appeared to help. CT imaging done on 10/2 shows no focal liver abnormalities and no biliary ductal dilation.   Her creatinine has doubled in the past 48 hours, followed by nephrology now and getting albumin  and midodrine , considering starting octreotide . ?HRS? Vs. Contrast nephropathy. Diuretics being held.  On top of this she has had abdominal  pain and a variety of locations.  CT scan 10/2 raise question of possible appendicitis from October 2.  She was not thought to be a surgical candidate, on ceftriaxone  per surgery.  She has had a few paracentesis now without evidence of SBP.  She had another large-volume paracentesis today, analysis pending, would be surprised if she had SBP in the setting of antibiotics.  Will see her for abdomen feels better following paracentesis.  Very difficult situation.  She is not a good candidate for empiric steroids if alcoholic hepatitis is considered, given possible underlying appendicitis (last drink again was over a month ago, this seems to be more likely decompensated cirrhosis causing jaundice).  Will await her labs tomorrow following interventions of nephrology, continue NAC empirically, await fluid studies from recent paracentesis.  Giving vitamin K  for coagulopathy.  She is not a good transplant candidate in light of her recent / persistent alcohol use.  Consideration for repeat CT scan (noncontrast) should labs  continue to worsen and/or pain worsen, in regards to reassessment of possible appendicitis.  She had more left-sided abdominal pain as opposed to right-sided on exam today.  Treating with lactulose for suspected hepatic encephalopathy.  She endorses constipation and requesting a suppository or enema today to help with that, can give lactulose enema.  Rifaximin held due to possible rash  Otherwise encouraged boost/Ensure to help nutritional state as best she can tolerated.  Continue PPI for history of esophagitis/ulcers.  Complex medical decision making, total time spent greater than 50 minutes. High risk for mortality this admission.   Marcey Naval, MD [facility]

## 2023-10-15 NOTE — Progress Notes (Signed)
 PT Cancellation Note  Patient Details Name: MARGARETH KANNER MRN: [id number] DOB: 1970-02-08   Cancelled Treatment:    Reason Eval/Treat Not Completed: (P) Patient at procedure or test/unavailable (Pt off floor for procedure. Will follow up as able.)   Darryle George 10/15/2023, 1:42 PM

## 2023-10-15 NOTE — Plan of Care (Signed)

## 2023-10-15 NOTE — Consult Note (Signed)
 Joan Wood  INPATIENT CONSULTATION  Reason for Consultation: AKI Requesting Provider: Dr. Meliton Monte  HPI: Joan Wood is an 53 y.o. 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   Pt admitted [facility] 9/24 N/V/D x 2 days.  Underwent EGD for ABLA found to have discoloration distal esophagus concerning for necrosis, no bleeding gastric and duodenal ulcers. 9/25 LVP 3.3L.  She was transferred to [facility] ICU - GIB stable, now on floor.     10/2 CT with contrast - no kidney pathology noted, poss acute appendicitis, esophageal changes.  BP 80-90s/50s most of the time on midodrine  15 TID.   Last dose of lasix was 10/4, last dose of spironolactone 10/4.  She rec'd albumin  25g IV intermittently, had 3 doses in the last 24h.  She's on ceftriaxone  2g daily since 10/3.   She complains of tense abdomen, early satiety related to that, inability to void - having to push on bladder to void.  She denies dysuria.    PMH: Past Medical History:  Diagnosis Date   Anxiety    Dysrhythmia    GERD (gastroesophageal reflux disease)    Hernia of abdominal wall    History of hiatal hernia    Sleep apnea    SVT (supraventricular tachycardia)    PSH: Past Surgical History:  Procedure Laterality Date   ESOPHAGOGASTRODUODENOSCOPY N/A 05/17/2023   Procedure: EGD (ESOPHAGOGASTRODUODENOSCOPY);  Surgeon: Cindie Carlin POUR, DO;  Location: [facility];  Service: Endoscopy;  Laterality: N/A;   ESOPHAGOGASTRODUODENOSCOPY N/A 10/04/2023   Procedure: EGD (ESOPHAGOGASTRODUODENOSCOPY);  Surgeon: Cinderella Deatrice FALCON, MD;  Location: [facility] END[facility] SU[facility];  Service: Endoscopy;  Laterality: N/A;   FINGER FRACTURE SURGERY     FRACTURE SURGERY     GASTRORRHAPHY N/A 08/05/2023   Procedure: GASTRORRHAPHY;  Surgeon: Kallie Manuelita BROCKS, MD;  Location: [facility] ORS;  Service: General;  Laterality: N/A;    LAPAROTOMY N/A 08/05/2023   Procedure: LAPAROTOMY, EXPLORATORY;  Surgeon: Kallie Manuelita BROCKS, MD;  Location: [facility] ORS;  Service: General;  Laterality: N/A;   LIVER BIOPSY N/A 08/05/2023   Procedure: BIOPSY, LIVER;  Surgeon: Kallie Manuelita BROCKS, MD;  Location: [facility] ORS;  Service: General;  Laterality: N/A;   right ankle repair     age 74   UMBILICAL HERNIA REPAIR N/A 08/05/2023   Procedure: REPAIR, HERNIA, UMBILICAL, ADULT;  Surgeon: Kallie Manuelita BROCKS, MD;  Location: [facility] ORS;  Service: General;  Laterality: N/A;     Past Medical History:  Diagnosis Date   Anxiety    Dysrhythmia    GERD (gastroesophageal reflux disease)    Hernia of abdominal wall    History of hiatal hernia    Sleep apnea    SVT (supraventricular tachycardia)     Medications:  I have reviewed the patient's current medications.  Medications Prior to Admission  Medication Sig Dispense Refill   alum & mag hydroxide-simeth (MAALOX/MYLANTA) 200-200-20 MG/5ML suspension Take 15 mLs by mouth every 6 (six) hours as needed for indigestion or heartburn.     ondansetron  (ZOFRAN ) 4 MG tablet Take 1 tablet (4 mg total) by mouth every 8 (eight) hours as needed for nausea or vomiting.     oxyCODONE -acetaminophen  (PERCOCET/ROXICET) 5-325 MG tablet Take 1 tablet by mouth every 6 (six) hours as needed for severe pain (pain score 7-10). 8 tablet 0   sertraline  (ZOLOFT ) 50 MG tablet Take 1 tablet (50 mg  total) by mouth daily. 30 tablet 4   Elastic Bandages & Supports (FITRITE BACK BRACE WITH PULLEY) MISC 1 Units by Does not apply route daily. 1 each 0   ondansetron  (ZOFRAN -ODT) 4 MG disintegrating tablet Take 1 tablet (4 mg total) by mouth every 8 (eight) hours as needed for nausea or vomiting. (Patient not taking: Reported on 10/03/2023) 20 tablet 0   potassium chloride  SA (KLOR-CON  M) 20 MEQ tablet Take 2 tablets (40 mEq total) by mouth daily for 2 doses. (Patient not taking: Reported on 10/03/2023) 4 tablet 0    ALLERGIES:    Allergies  Allergen Reactions   Bee Venom Anaphylaxis    Cannot take epi for this allergy d/t heart condition per pt   Fire Ant Hives and Rash   Latex Rash    Latex band aids, gloves don't bother her    FAM HX: Family History  Problem Relation Age of Onset   Cancer Mother    Leukemia Mother    Multiple myeloma Mother    Cancer Father        unsure what kind   Cancer - Colon Neg Hx    Colon polyps Neg Hx     Social History:   reports that she has been smoking cigarettes. She started smoking about 19 months ago. She has a 28.6 pack-year smoking history. She has never used smokeless tobacco. She reports current alcohol use of about 2.0 standard drinks of alcohol per week. She reports that she does not use drugs.  ROS: 12 system ROS neg except per HPI  Blood pressure (!) 86/56, pulse 91, temperature 97.6 F (36.4 C), temperature source Oral, resp. rate 18, height 5' 2 (1.575 m), weight 71.8 kg, SpO2 94%. PHYSICAL EXAM: Gen: chronically ill but nontoxic appearing  Eyes: icteric, EOMI ENT:MMM CV: RRR Abd: tense, distended, TTP Back: clear lungs GU: no foley Extr: 2+ pitting LE edema Neuro: AOx3 but does lose train of thought and is a bit tangential at times Skin: jaundiced   Results for orders placed or performed during the hospital encounter of 10/03/23 (from the past 48 hours)  Glucose, capillary     Status: None   Collection Time: 10/13/23 12:09 PM  Result Value Ref Range   Glucose-Capillary 90 70 - 99 mg/dL    Comment: Glucose reference range applies only to samples taken after fasting for at least 8 hours.   Comment 1 Notify RN    Comment 2 Document in Chart   Glucose, capillary     Status: Abnormal   Collection Time: 10/13/23  4:04 PM  Result Value Ref Range   Glucose-Capillary 120 (H) 70 - 99 mg/dL    Comment: Glucose reference range applies only to samples taken after fasting for at least 8 hours.   Comment 1 Notify RN    Comment 2 Document in Chart    Glucose, capillary     Status: Abnormal   Collection Time: 10/13/23 10:06 PM  Result Value Ref Range   Glucose-Capillary 135 (H) 70 - 99 mg/dL    Comment: Glucose reference range applies only to samples taken after fasting for at least 8 hours.   Comment 1 Notify RN    Comment 2 Document in Chart   Glucose, capillary     Status: Abnormal   Collection Time: 10/14/23 12:57 AM  Result Value Ref Range   Glucose-Capillary 127 (H) 70 - 99 mg/dL    Comment: Glucose reference range applies only to samples taken after  fasting for at least 8 hours.   Comment 1 Notify RN    Comment 2 Document in Chart   Glucose, capillary     Status: Abnormal   Collection Time: 10/14/23  3:36 AM  Result Value Ref Range   Glucose-Capillary 134 (H) 70 - 99 mg/dL    Comment: Glucose reference range applies only to samples taken after fasting for at least 8 hours.   Comment 1 Notify RN    Comment 2 Document in Chart   CBC with Differential/Platelet     Status: Abnormal   Collection Time: 10/14/23  4:52 AM  Result Value Ref Range   WBC 10.1 4.0 - 10.5 K/uL   RBC 2.97 (L) 3.87 - 5.11 MIL/uL   Hemoglobin 9.6 (L) 12.0 - 15.0 g/dL   HCT 72.3 (L) 63.9 - 53.9 %   MCV 92.9 80.0 - 100.0 fL   MCH 32.3 26.0 - 34.0 pg   MCHC 34.8 30.0 - 36.0 g/dL   RDW 77.5 (H) 88.4 - 84.4 %   Platelets 104 (L) 150 - 400 K/uL    Comment: REPEATED TO VERIFY   nRBC 0.0 0.0 - 0.2 %   Neutrophils Relative % 73 %   Neutro Abs 7.4 1.7 - 7.7 K/uL   Lymphocytes Relative 10 %   Lymphs Abs 1.1 0.7 - 4.0 K/uL   Monocytes Relative 16 %   Monocytes Absolute 1.6 (H) 0.1 - 1.0 K/uL   Eosinophils Relative 0 %   Eosinophils Absolute 0.0 0.0 - 0.5 K/uL   Basophils Relative 0 %   Basophils Absolute 0.0 0.0 - 0.1 K/uL   Immature Granulocytes 1 %   Abs Immature Granulocytes 0.06 0.00 - 0.07 K/uL    Comment: Performed at [facility], [address], [place], [name] [address]  Comprehensive metabolic panel with GFR     Status: Abnormal    Collection Time: 10/14/23  4:52 AM  Result Value Ref Range   Sodium 127 (L) 135 - 145 mmol/L   Potassium 4.1 3.5 - 5.1 mmol/L   Chloride 94 (L) 98 - 111 mmol/L   CO2 23 22 - 32 mmol/L   Glucose, Bld 121 (H) 70 - 99 mg/dL    Comment: Glucose reference range applies only to samples taken after fasting for at least 8 hours.   BUN <5 (L) 6 - 20 mg/dL   Creatinine, Ser 8.89 (H) 0.44 - 1.00 mg/dL   Calcium  8.6 (L) 8.9 - 10.3 mg/dL   Total Protein 5.2 (L) 6.5 - 8.1 g/dL   Albumin  2.4 (L) 3.5 - 5.0 g/dL   AST 99 (H) 15 - 41 U/L   ALT 28 0 - 44 U/L   Alkaline Phosphatase 34 (L) 38 - 126 U/L   Total Bilirubin 12.5 (H) 0.0 - 1.2 mg/dL   GFR, Estimated 60 (L) >60 mL/min    Comment: (NOTE) Calculated using the CKD-EPI Creatinine Equation (2021)    Anion gap 10 5 - 15    Comment: Performed at [facility], [address], [place], [name] [address]  Magnesium      Status: None   Collection Time: 10/14/23  4:52 AM  Result Value Ref Range   Magnesium  1.7 1.7 - 2.4 mg/dL    Comment: Performed at [facility], [address], [place], [name] [address]  Protime-INR     Status: Abnormal   Collection Time: 10/14/23  4:52 AM  Result Value Ref Range   Prothrombin  Time 31.4 (H) 11.4 - 15.2 seconds   INR 2.9 (H) 0.8 - 1.2    Comment: (NOTE) INR goal varies based on device and disease states. Performed at [facility], [address] [place], [name] [address]   Glucose, capillary     Status: None   Collection Time: 10/14/23  9:00 AM  Result Value Ref Range   Glucose-Capillary 92 70 - 99 mg/dL    Comment: Glucose reference range applies only to samples taken after fasting for at least 8 hours.   Comment 1 Notify RN   Glucose, capillary     Status: None   Collection Time: 10/14/23 11:32 AM  Result Value Ref Range   Glucose-Capillary 83 70 - 99 mg/dL    Comment: Glucose reference range applies only to samples taken after fasting for at least 8 hours.   Comment 1 Notify RN    Glucose, capillary     Status: Abnormal   Collection Time: 10/14/23  3:20 PM  Result Value Ref Range   Glucose-Capillary 100 (H) 70 - 99 mg/dL    Comment: Glucose reference range applies only to samples taken after fasting for at least 8 hours.   Comment 1 Notify RN   Glucose, capillary     Status: Abnormal   Collection Time: 10/14/23  7:53 PM  Result Value Ref Range   Glucose-Capillary 117 (H) 70 - 99 mg/dL    Comment: Glucose reference range applies only to samples taken after fasting for at least 8 hours.  Glucose, capillary     Status: None   Collection Time: 10/14/23 11:53 PM  Result Value Ref Range   Glucose-Capillary 98 70 - 99 mg/dL    Comment: Glucose reference range applies only to samples taken after fasting for at least 8 hours.  Glucose, capillary     Status: Abnormal   Collection Time: 10/15/23  3:40 AM  Result Value Ref Range   Glucose-Capillary 118 (H) 70 - 99 mg/dL    Comment: Glucose reference range applies only to samples taken after fasting for at least 8 hours.  CBC with Differential/Platelet     Status: Abnormal   Collection Time: 10/15/23  5:28 AM  Result Value Ref Range   WBC 10.3 4.0 - 10.5 K/uL   RBC 3.10 (L) 3.87 - 5.11 MIL/uL   Hemoglobin 10.0 (L) 12.0 - 15.0 g/dL   HCT 70.7 (L) 63.9 - 53.9 %   MCV 94.2 80.0 - 100.0 fL   MCH 32.3 26.0 - 34.0 pg   MCHC 34.2 30.0 - 36.0 g/dL   RDW 78.3 (H) 88.4 - 84.4 %   Platelets 89 (L) 150 - 400 K/uL    Comment: PLATELET COUNT CONFIRMED BY SMEAR REPEATED TO VERIFY Immature Platelet Fraction may be clinically indicated, consider ordering this additional test [id number]    nRBC 0.0 0.0 - 0.2 %   Neutrophils Relative % 82 %   Neutro Abs 8.4 (H) 1.7 - 7.7 K/uL   Lymphocytes Relative 9 %   Lymphs Abs 0.9 0.7 - 4.0 K/uL   Monocytes Relative 9 %   Monocytes Absolute 0.9 0.1 - 1.0 K/uL   Eosinophils Relative 0 %   Eosinophils Absolute 0.0 0.0 - 0.5 K/uL   Basophils Relative 0 %   Basophils Absolute 0.0 0.0 - 0.1  K/uL   WBC Morphology See Note     Comment:  Morphology unremarkable   Smear Review See Note     Comment:  Platelets Appear  Decreased.   Polychromasia PRESENT     Comment: Performed at [facility], [address], [place], [name] [address]  Comprehensive metabolic panel with GFR     Status: Abnormal   Collection Time: 10/15/23  5:28 AM  Result Value Ref Range   Sodium 125 (L) 135 - 145 mmol/L   Potassium 4.1 3.5 - 5.1 mmol/L   Chloride 93 (L) 98 - 111 mmol/L   CO2 19 (L) 22 - 32 mmol/L   Glucose, Bld 102 (H) 70 - 99 mg/dL    Comment: Glucose reference range applies only to samples taken after fasting for at least 8 hours.   BUN <5 (L) 6 - 20 mg/dL   Creatinine, Ser 8.59 (H) 0.44 - 1.00 mg/dL   Calcium  9.0 8.9 - 10.3 mg/dL   Total Protein 5.6 (L) 6.5 - 8.1 g/dL   Albumin  2.8 (L) 3.5 - 5.0 g/dL   AST 87 (H) 15 - 41 U/L   ALT 29 0 - 44 U/L   Alkaline Phosphatase 31 (L) 38 - 126 U/L   Total Bilirubin 13.4 (H) 0.0 - 1.2 mg/dL   GFR, Estimated 45 (L) >60 mL/min    Comment: (NOTE) Calculated using the CKD-EPI Creatinine Equation (2021)    Anion gap 13 5 - 15    Comment: Performed at [facility], [address], [place], [name] [address]  Magnesium      Status: None   Collection Time: 10/15/23  5:28 AM  Result Value Ref Range   Magnesium  1.8 1.7 - 2.4 mg/dL    Comment: Performed at [facility], [address], [place], [name] [address]  Phosphorus     Status: None   Collection Time: 10/15/23  5:28 AM  Result Value Ref Range   Phosphorus 3.9 2.5 - 4.6 mg/dL    Comment: Performed at [facility], [address], [place], [name] [address]  Protime-INR     Status: Abnormal   Collection Time: 10/15/23  5:28 AM  Result Value Ref Range   Prothrombin Time 32.5 (H) 11.4 - 15.2 seconds   INR 3.0 (H) 0.8 - 1.2    Comment: (NOTE) INR goal varies based on device and disease states. Performed at [facility], [address] [place], [name] [address]    Glucose, capillary     Status: Abnormal   Collection Time: 10/15/23  8:19 AM  Result Value Ref Range   Glucose-Capillary 104 (H) 70 - 99 mg/dL    Comment: Glucose reference range applies only to samples taken after fasting for at least 8 hours.   Comment 1 Notify RN     CT FEMUR RIGHT WO CONTRAST Result Date: 10/14/2023 CLINICAL DATA:  Right lower extremity swelling and pain. History of decompensated alcoholic cirrhosis with ascites for which the patient underwent paracentesis yesterday. EXAM: CT OF THE LOWER RIGHT EXTREMITY WITHOUT CONTRAST TECHNIQUE: Multidetector CT imaging of the right thigh was performed according to the standard protocol. RADIATION DOSE REDUCTION: This exam was performed according to the departmental dose-optimization program which includes automated exposure control, adjustment of the mA and/or kV according to patient size and/or use of iterative reconstruction technique. COMPARISON:  Abdominopelvic CT 10/11/2023 and 10/03/2023. FINDINGS: Bones/Joint/Cartilage No evidence of acute fracture, dislocation or femoral head osteonecrosis. No significant arthropathy or effusions identified at the right hip or knee. Ligaments Suboptimally assessed by CT. Muscles and Tendons The right thigh muscles and tendons appear unremarkable. Soft tissues Moderate  generalized subcutaneous edema throughout the right thigh, the proximal extent of which appears similar to recent pelvic CT. There appears to be similar subcutaneous edema within the left thigh, incompletely visualized. No deep soft tissue inflammatory changes, organized fluid collections, foreign bodies or soft tissue emphysema identified. Moderate to large amount of pelvic ascites again noted. IMPRESSION: 1. Moderate generalized subcutaneous edema throughout both visualized size, similar to recent pelvic CT. This finding is nonspecific and suboptimally evaluated by noncontrast CT. Differential includes anasarca/third-spacing and cellulitis.  Correlate clinically. 2. No deep soft tissue inflammatory changes, organized fluid collections, foreign bodies or soft tissue emphysema identified. 3. No acute or significant osseous findings. 4. Moderate to large amount of pelvic ascites again noted. Electronically Signed   By: Elsie Perone M.D.   On: 10/14/2023 14:25    Assessment/PlanJill R Sawdey is an 54 y.o. 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    **AKI, oliguric:  Baseline Cr 0.4, rose to 0.6 on 10/4, 1.1 10/5, 1.4 today and now oliguric.  She had CT w contrast 10/2. BP chronically low 80/50s -90/50s already on midodrine .  UA last 10/1 wasn't a clean catch - repeat today, check urine sodium.   HRS dx exclusion and in the context of recent known nephrotoxin exposure (contrast) cannot make this diagnosis, however if urine sodium low and cr continues to rise tomorrow I would favor adding octreotide  in case HRS physiology contributing.   She had albumin  challenge already and in the setting of volume overload would not continue unless plans for LVP (seeb below).  OK for dose of IV lasix 20 today given volume overload and not hypovolemic.   With new LUTs and severe AKI place foley.   Cont daily labs, strict I/Os.  No current indications for RRT - poor candidate for dialysis should need arise.    **Hyponatremia:  In the setting of decompensated cirrhosis, hypervolemia and severe AKI.  Sodium 125 today, continue daily labs, lasix as above.    **Cirrhosis, decompensated:  alcohol in etiology.  GI following.  Ok for mod volume (~3L) paracentesis today + IV albumin  25g/L on cue to paracentesis.    **Anemia:  ABLA in the setting of recent ulcers, severe esophagitis. Hb stable in 9-10s now. On PPI and carafate  per GI.   **Possible appendicitis:  surgery recommended abx, high risk for surgery and no plans to proceed  to OR.    Will follow, reach out with concerns.   Manuelita DELENA Barters 10/15/2023, 11:46 AM

## 2023-10-16 ENCOUNTER — Inpatient Hospital Stay (HOSPITAL_COMMUNITY)

## 2023-10-16 DIAGNOSIS — R109 Unspecified abdominal pain: Secondary | ICD-10-CM

## 2023-10-16 DIAGNOSIS — D62 Acute posthemorrhagic anemia: Secondary | ICD-10-CM | POA: Diagnosis not present

## 2023-10-16 DIAGNOSIS — K7031 Alcoholic cirrhosis of liver with ascites: Secondary | ICD-10-CM | POA: Diagnosis not present

## 2023-10-16 DIAGNOSIS — N179 Acute kidney failure, unspecified: Secondary | ICD-10-CM | POA: Diagnosis not present

## 2023-10-16 LAB — COMPREHENSIVE METABOLIC PANEL WITH GFR
ALT: 18 U/L (ref 0–44)
AST: 58 U/L — ABNORMAL HIGH (ref 15–41)
Albumin: 3.4 g/dL — ABNORMAL LOW (ref 3.5–5.0)
Alkaline Phosphatase: 26 U/L — ABNORMAL LOW (ref 38–126)
Anion gap: 13 (ref 5–15)
BUN: 6 mg/dL (ref 6–20)
CO2: 20 mmol/L — ABNORMAL LOW (ref 22–32)
Calcium: 9.2 mg/dL (ref 8.9–10.3)
Chloride: 92 mmol/L — ABNORMAL LOW (ref 98–111)
Creatinine, Ser: 1.2 mg/dL — ABNORMAL HIGH (ref 0.44–1.00)
GFR, Estimated: 54 mL/min — ABNORMAL LOW (ref >60)
Glucose, Bld: 93 mg/dL (ref 70–99)
Potassium: 3.3 mmol/L — ABNORMAL LOW (ref 3.5–5.1)
Sodium: 125 mmol/L — ABNORMAL LOW (ref 135–145)
Total Bilirubin: 10.5 mg/dL — ABNORMAL HIGH (ref 0.0–1.2)
Total Protein: 5.3 g/dL — ABNORMAL LOW (ref 6.5–8.1)

## 2023-10-16 LAB — CBC WITH DIFFERENTIAL/PLATELET
Abs Immature Granulocytes: 0.03 K/uL (ref 0.00–0.07)
Basophils Absolute: 0 K/uL (ref 0.0–0.1)
Basophils Relative: 0 %
Eosinophils Absolute: 0 K/uL (ref 0.0–0.5)
Eosinophils Relative: 0 %
HCT: 22.7 % — ABNORMAL LOW (ref 36.0–46.0)
Hemoglobin: 7.8 g/dL — ABNORMAL LOW (ref 12.0–15.0)
Immature Granulocytes: 0 %
Lymphocytes Relative: 13 %
Lymphs Abs: 0.9 K/uL (ref 0.7–4.0)
MCH: 32.8 pg (ref 26.0–34.0)
MCHC: 34.4 g/dL (ref 30.0–36.0)
MCV: 95.4 fL (ref 80.0–100.0)
Monocytes Absolute: 0.9 K/uL (ref 0.1–1.0)
Monocytes Relative: 14 %
Neutro Abs: 5 K/uL (ref 1.7–7.7)
Neutrophils Relative %: 73 %
Platelets: 70 K/uL — ABNORMAL LOW (ref 150–400)
RBC: 2.38 MIL/uL — ABNORMAL LOW (ref 3.87–5.11)
RDW: 21 % — ABNORMAL HIGH (ref 11.5–15.5)
WBC: 6.8 K/uL (ref 4.0–10.5)
nRBC: 0 % (ref 0.0–0.2)

## 2023-10-16 LAB — GLUCOSE, CAPILLARY
Glucose-Capillary: 100 mg/dL — ABNORMAL HIGH (ref 70–99)
Glucose-Capillary: 108 mg/dL — ABNORMAL HIGH (ref 70–99)
Glucose-Capillary: 113 mg/dL — ABNORMAL HIGH (ref 70–99)
Glucose-Capillary: 118 mg/dL — ABNORMAL HIGH (ref 70–99)
Glucose-Capillary: 120 mg/dL — ABNORMAL HIGH (ref 70–99)
Glucose-Capillary: 88 mg/dL (ref 70–99)
Glucose-Capillary: 90 mg/dL (ref 70–99)

## 2023-10-16 LAB — PHOSPHORUS: Phosphorus: 3.3 mg/dL (ref 2.5–4.6)

## 2023-10-16 LAB — BODY FLUID CULTURE W GRAM STAIN: Culture: NO GROWTH

## 2023-10-16 LAB — PROTIME-INR
INR: 3.9 — ABNORMAL HIGH (ref 0.8–1.2)
Prothrombin Time: 39.7 s — ABNORMAL HIGH (ref 11.4–15.2)

## 2023-10-16 LAB — MAGNESIUM: Magnesium: 1.8 mg/dL (ref 1.7–2.4)

## 2023-10-16 LAB — HEMOGLOBIN AND HEMATOCRIT, BLOOD
HCT: 24.7 % — ABNORMAL LOW (ref 36.0–46.0)
Hemoglobin: 8.6 g/dL — ABNORMAL LOW (ref 12.0–15.0)

## 2023-10-16 LAB — SODIUM, URINE, RANDOM: Sodium, Ur: <30 mmol/L

## 2023-10-16 MED ORDER — POTASSIUM CHLORIDE CRYS ER 20 MEQ PO TBCR
40.0000 meq | EXTENDED_RELEASE_TABLET | Freq: Once | ORAL | Status: AC
Start: 2023-10-16 — End: 2023-10-16
  Administered 2023-10-16: 40 meq via ORAL
  Filled 2023-10-16: qty 2

## 2023-10-16 MED ORDER — FUROSEMIDE 10 MG/ML IJ SOLN
20.0000 mg | Freq: Once | INTRAMUSCULAR | Status: AC
Start: 2023-10-16 — End: 2023-10-16
  Administered 2023-10-16: 20 mg via INTRAVENOUS
  Filled 2023-10-16: qty 4

## 2023-10-16 MED ORDER — ALBUMIN HUMAN 25 % IV SOLN
50.0000 g | Freq: Once | INTRAVENOUS | Status: AC
  Administered 2023-10-16: 12.5 g via INTRAVENOUS
  Filled 2023-10-16: qty 200

## 2023-10-16 MED ORDER — PHYTONADIONE 5 MG PO TABS
10.0000 mg | ORAL_TABLET | Freq: Once | ORAL | Status: AC
  Administered 2023-10-16: 10 mg via ORAL
  Filled 2023-10-16: qty 2

## 2023-10-16 NOTE — Progress Notes (Signed)
 Physical Therapy Treatment Patient Details Name: Joan Wood MRN: [id number] DOB: 1970/07/16 Today's Date: 10/16/2023   History of Present Illness 53 y.o. female presents to [facility] hospital on 10/03/2023 with nausea/vomiting and bloody diarrhea. Pt underwent upper EGD on 9/25, concerning for possible acute esophageal necrosis. Pt also underwent paracentesis on 9/25. PMH includes decompensated cirrhosis, PUD, GERD, pancreatitis, OSA.    PT Comments  Pt received in supine and agreeable to session. Pt appears less alert and more internally distracted by pain this session. Bed noted to be soiled with drainage, so linens changed. Pt requires increased assist with bed mobility and STS due to weakness and pain. Pt reports increased dizziness and nausea with sitting EOB and standing with noted drop in BP. Pt with very limited standing tolerance and then requesting bedpan for BM. Pt requesting multiple times during session for catheter to be removed due to pain. Pt continues to benefit from PT services to progress toward functional mobility goals.   BP sitting EOB: 84/57 BP after standing trial: 71/42   If plan is discharge home, recommend the following: A lot of help with walking and/or transfers;A lot of help with bathing/dressing/bathroom;Assistance with cooking/housework;Assist for transportation;Help with stairs or ramp for entrance   Can travel by private vehicle     No  Equipment Recommendations  Hospital bed;BSC/3in1    Recommendations for Other Services       Precautions / Restrictions Precautions Precautions: Fall Recall of Precautions/Restrictions: Impaired Precaution/Restrictions Comments: orthostatic Restrictions Weight Bearing Restrictions Per Provider Order: No     Mobility  Bed Mobility Overal bed mobility: Needs Assistance Bed Mobility: Sit to Supine, Supine to Sit     Supine to sit: Mod assist, +2 for safety/equipment, HOB elevated, Used rails Sit to supine: Total  assist, +2 for physical assistance   General bed mobility comments: Assist for RLE advancement and trunk elevation. Step by step cues due to decreased initiation and internal distraction. Increased assist to return to supine due to pain and dizziness    Transfers Overall transfer level: Needs assistance Equipment used: Rolling walker (2 wheels) Transfers: Sit to/from Stand Sit to Stand: Mod assist, +2 physical assistance           General transfer comment: mod A +2 for power up and cues for hand placement. Limited standing tolerance due to dizziness and nausea    Ambulation/Gait               General Gait Details: unable due to low BP   Stairs             Wheelchair Mobility     Tilt Bed    Modified Rankin (Stroke Patients Only)       Balance Overall balance assessment: Needs assistance Sitting-balance support: No upper extremity supported, Feet supported, Bilateral upper extremity supported Sitting balance-Leahy Scale: Fair Sitting balance - Comments: CGA sitting EOB   Standing balance support: Bilateral upper extremity supported, Reliant on assistive device for balance, During functional activity Standing balance-Leahy Scale: Poor Standing balance comment: reliant on RW support                            Communication Communication Communication: No apparent difficulties  Cognition Arousal: Alert Behavior During Therapy: Lability, Anxious   PT - Cognitive impairments: Awareness, Safety/Judgement, Problem solving, Attention  PT - Cognition Comments: Pt distracted by pain requiring redirection and encouragement throughout session. Following commands: Impaired Following commands impaired: Follows one step commands with increased time, Follows multi-step commands inconsistently    Cueing Cueing Techniques: Verbal cues, Visual cues, Tactile cues  Exercises      General Comments General comments (skin  integrity, edema, etc.): symptomatic orthostatic hypotension. BP sitting EOB 84/57; sitting after standing trial 71/42      Pertinent Vitals/Pain Pain Assessment Pain Assessment: Faces Faces Pain Scale: Hurts whole lot Pain Location: stomach, bottom, foley site Pain Descriptors / Indicators: Aching, Discomfort Pain Intervention(s): Limited activity within patient's tolerance, Monitored during session, Repositioned     PT Goals (current goals can now be found in the care plan section) Acute Rehab PT Goals Patient Stated Goal: to return to independence PT Goal Formulation: With patient Time For Goal Achievement: 10/21/23 Progress towards PT goals: Progressing toward goals    Frequency    Min 2X/week      PT Plan      Co-evaluation PT/OT/SLP Co-Evaluation/Treatment: Yes Reason for Co-Treatment: For patient/therapist safety;To address functional/ADL transfers PT goals addressed during session: Mobility/safety with mobility;Balance        AM-PAC PT 6 Clicks Mobility   Outcome Measure  Help needed turning from your back to your side while in a flat bed without using bedrails?: A Little Help needed moving from lying on your back to sitting on the side of a flat bed without using bedrails?: A Lot Help needed moving to and from a bed to a chair (including a wheelchair)?: A Lot Help needed standing up from a chair using your arms (e.g., wheelchair or bedside chair)?: A Lot Help needed to walk in hospital room?: Total Help needed climbing 3-5 steps with a railing? : Total 6 Click Score: 11    End of Session Equipment Utilized During Treatment: Gait belt Activity Tolerance: Other (comment);Patient limited by pain (low BP) Patient left: with call bell/phone within reach;in bed;with bed alarm set Nurse Communication: Mobility status;Other (comment) (on bedpan) PT Visit Diagnosis: Other abnormalities of gait and mobility (R26.89);Muscle weakness (generalized) (M62.81)      Time: 8947-8881 PT Time Calculation (min) (ACUTE ONLY): 26 min  Charges:    $Therapeutic Activity: 8-22 mins PT General Charges $$ ACUTE PT VISIT: 1 Visit                     Darryle George, PTA Acute Rehabilitation Services Secure Chat Preferred  [phone]    Darryle George 10/16/2023, 12:41 PM

## 2023-10-16 NOTE — TOC Progression Note (Signed)
 Transition of Care [facility]) - Progression Note    Patient Details  Name: Joan Wood MRN: [id number] Date of Birth: 1970/01/20  Transition of Care [facility]) CM/SW Contact  Andrez JULIANNA George, RN Phone Number: 10/16/2023, 1:36 PM  Clinical Narrative:     Pt continues with medical treatments. Still refusing SNF rehab.  IP Care management following.  Expected Discharge Plan: OP Rehab Barriers to Discharge: Continued Medical Work up               Expected Discharge Plan and Services In-house Referral: Clinical Social Work Discharge Planning Services: CM Consult Post Acute Care Choice: Home Health Living arrangements for the past 2 months: Single Family Home                 DME Arranged: Walker rolling   Date DME Agency Contacted: 10/08/23   Representative spoke with at DME Agency: [name]             Social Drivers of Health (SDOH) Interventions SDOH Screenings   Food Insecurity: No Food Insecurity (10/03/2023)  Housing: Low Risk  (10/03/2023)  Transportation Needs: No Transportation Needs (10/03/2023)  Recent Concern: Transportation Needs - Unmet Transportation Needs (08/20/2023)  Utilities: Not At Risk (10/03/2023)  Depression (PHQ2-9): High Risk (07/30/2023)  Social Connections: Socially Isolated (10/03/2023)  Tobacco Use: High Risk (10/04/2023)    Readmission Risk Interventions    10/04/2023    9:02 AM 09/03/2023   12:31 PM 09/02/2023    9:01 AM  Readmission Risk Prevention Plan  Transportation Screening Complete Complete Complete  PCP or Specialist Appt within 3-5 Days   Complete  HRI or Home Care Consult   Complete  Social Work Consult for Recovery Care Planning/Counseling   Complete  Palliative Care Screening   Not Applicable  Medication Review Oceanographer) Complete Complete Complete  HRI or Home Care Consult Complete Complete   SW Recovery Care/Counseling Consult Complete Complete   Palliative Care Screening Not Applicable Not Applicable   Skilled  Nursing Facility Not Applicable Patient Refused

## 2023-10-16 NOTE — Progress Notes (Addendum)
 Patient ID: Joan Wood, female   DOB: [birth year], 53 y.o.   MRN: [id number]    Progress Note   Subjective   Day # 12 CC; severely decompensated EtOH induced cirrhosis, refractory ascites, history of previous SBP, currently admitted with acute GI bleed.  Recent history of perforated gastric ulcer status post omental patch repair July 2025  Possible acute appendicitis per CT on 10/11/2023-covering with Rocephin  and metronidazole   NAC-day #4 Diuretics on hold On midodrine  15 3 times daily, albumin  every 6  Ultrasound-guided paracentesis yesterday with 4 L removed-counts not consistent with SBP Gram stain no organisms, culture negative thus far  Labs today WBC 6.8/hemoglobin 7.8/hematocrit 22.7/platelets 70 Sodium 125/potassium 3.3/BUN 6/creatinine 1.20 (down from 1.4 yesterday) T. bili 10.5/alk phos 26/AST 58/ALT 18 Urine sodium not collected yesterday  Pro time 39.7/INR 3.9 despite vitamin K  started yesterday  Patient seems to be mentating a bit better today does not seem to have a good understanding overall of the severity of her illness.,  Says she has been having several bowel movements that are not loose since receiving lactulose enemas last night Still complaining of rather diffuse abdominal pain, both sides of her abdomen and across her abdomen and into the lower abdomen.  She did not complain of thigh pain while I was examining her.  Still complaining of pain from the Foley.  Swallowing without difficulty at this time.   Objective   Vital signs in last 24 hours: Temp:  [97.8 F (36.6 C)-98.5 F (36.9 C)] 98.2 F (36.8 C) (10/07 0735) Pulse Rate:  [87-94] 87 (10/07 0735) Resp:  [15-18] 15 (10/07 0735) BP: (77-124)/(46-86) 92/55 (10/07 0751) SpO2:  [93 %-100 %] 100 % (10/07 0735) Last BM Date : 10/14/23 General:   Older white female in NAD, jaundiced, easily distracted, and forgetful Heart:  Regular rate and rhythm; no murmurs Lungs: Respirations even and unlabored, lungs  CTA bilaterally Abdomen: Less tense than yesterday, still rather diffusely tender.  No guarding or rebound, bowel sounds are present, abdominal wall edema across the lower abdomen Extremities:  Without edema. Neurologic:  Alert and oriented x 3, positive asterixis Psych:  Cooperative. Normal mood and affect.  Intake/Output from previous day: 10/06 0701 - 10/07 0700 In: 120 [P.O.:120] Out: 1300 [Urine:1300] Intake/Output this shift: No intake/output data recorded.  Lab Results: Recent Labs    10/14/23 0452 10/15/23 0528 10/16/23 0534  WBC 10.1 10.3 6.8  HGB 9.6* 10.0* 7.8*  HCT 27.6* 29.2* 22.7*  PLT 104* 89* 70*   BMET Recent Labs    10/14/23 0452 10/15/23 0528 10/16/23 0534  NA 127* 125* 125*  K 4.1 4.1 3.3*  CL 94* 93* 92*  CO2 23 19* 20*  GLUCOSE 121* 102* 93  BUN <5* <5* 6  CREATININE 1.10* 1.40* 1.20*  CALCIUM  8.6* 9.0 9.2   LFT Recent Labs    10/16/23 0534  PROT 5.3*  ALBUMIN  3.4*  AST 58*  ALT 18  ALKPHOS 26*  BILITOT 10.5*   PT/INR Recent Labs    10/15/23 0528 10/16/23 0534  LABPROT 32.5* 39.7*  INR 3.0* 3.9*    Studies/Results: IR Paracentesis Result Date: 10/15/2023 INDICATION: 53 year old female with alcoholic cirrhosis and ascites. Request for therapeutic and diagnostic paracentesis up to 4 L. EXAM: ULTRASOUND GUIDED  PARACENTESIS MEDICATIONS: 10 mL 1% lidocaine  COMPLICATIONS: None immediate. PROCEDURE: Informed written consent was obtained from the patient after a discussion of the risks, benefits and alternatives to treatment. A timeout was performed prior to the initiation  of the procedure. Initial ultrasound scanning demonstrates a large amount of ascites within the right lower abdominal quadrant. The right lower abdomen was prepped and draped in the usual sterile fashion. 1% lidocaine  was used for local anesthesia. Following this, a 5 [place] centesis catheter was introduced. An ultrasound image was saved for documentation purposes.  The paracentesis was performed. The catheter was removed and a dressing was applied. The patient tolerated the procedure well without immediate post procedural complication. FINDINGS: A total of approximately 4 L of hazy yellow fluid was removed. Samples were sent to the laboratory as requested by the clinical team. IMPRESSION: Successful ultrasound-guided paracentesis yielding 4 liters of peritoneal fluid. PLAN: If the patient eventually requires >/=2 paracenteses in a 30 day period, candidacy for formal evaluation by the [facility] will be assessed. Performed by: Aimee Han, PA-C. Electronically Signed   By: [name].  Shick M.D.   On: 10/15/2023 14:54   CT FEMUR RIGHT WO CONTRAST Result Date: 10/14/2023 CLINICAL DATA:  Right lower extremity swelling and pain. History of decompensated alcoholic cirrhosis with ascites for which the patient underwent paracentesis yesterday. EXAM: CT OF THE LOWER RIGHT EXTREMITY WITHOUT CONTRAST TECHNIQUE: Multidetector CT imaging of the right thigh was performed according to the standard protocol. RADIATION DOSE REDUCTION: This exam was performed according to the departmental dose-optimization program which includes automated exposure control, adjustment of the mA and/or kV according to patient size and/or use of iterative reconstruction technique. COMPARISON:  Abdominopelvic CT 10/11/2023 and 10/03/2023. FINDINGS: Bones/Joint/Cartilage No evidence of acute fracture, dislocation or femoral head osteonecrosis. No significant arthropathy or effusions identified at the right hip or knee. Ligaments Suboptimally assessed by CT. Muscles and Tendons The right thigh muscles and tendons appear unremarkable. Soft tissues Moderate generalized subcutaneous edema throughout the right thigh, the proximal extent of which appears similar to recent pelvic CT. There appears to be similar subcutaneous edema within the left thigh, incompletely visualized.  No deep soft tissue inflammatory changes, organized fluid collections, foreign bodies or soft tissue emphysema identified. Moderate to large amount of pelvic ascites again noted. IMPRESSION: 1. Moderate generalized subcutaneous edema throughout both visualized size, similar to recent pelvic CT. This finding is nonspecific and suboptimally evaluated by noncontrast CT. Differential includes anasarca/third-spacing and cellulitis. Correlate clinically. 2. No deep soft tissue inflammatory changes, organized fluid collections, foreign bodies or soft tissue emphysema identified. 3. No acute or significant osseous findings. 4. Moderate to large amount of pelvic ascites again noted. Electronically Signed   By: Elsie Perone M.D.   On: 10/14/2023 14:25       Assessment / Plan:    #66 53 year old white female with severely decompensated EtOH induced cirrhosis with current MEL D NA=35 (10/ 06/2023) Initially admitted at [facility] with acute GI bleed.  EGD completed there and then transferred to [facility] for finding of severe grade D esophagitis with concern for component of esophageal necrosis in the lower third of the esophagus, also noted severe portal gastropathy throughout the stomach 1 nonbleeding cratered gastric ulcer and multiple superficial duodenal ulcers  Continues on twice daily PPI and Carafate  suspension Gastrin was normal Suspect difficulty healing secondary to her poor nutritional status and severely decompensated cirrhosis  MELD on the rise since admission, and progressive worsening of INR despite supportive management  She has been on NAC over the past 4 days due to concern for worsening hepatic failure.  #2 acute kidney injury-concern for possible HRS.-Nephrology following Creatinine improved today Urine sodium  not collected yesterday  Continue midodrine  3 times daily and albumin  IV every 6 hours Will reorder urine sodium  #3 somewhat refractory ascites, has required multiple  paracenteses.  She did have 4 L removed yesterday, no evidence of SBP by cell counts, culture pending  #4 probable acute appendicitis diagnosed by CT 10/11/2023-surgery consulted, not a surgical candidate, has been on ceftriaxone  and metronidazole   Continued complaints of diffuse abdominal pain yesterday seem to be worse in the right lower abdomen today complaining of more generalized pain.  She definitely is uncomfortable from anasarca/lower abdominal wall edema but will need repeat CT to reevaluate the appendix  #5 history of perforated gastric ulcer July 2025 requiring omental patch #6 persistent hyponatremia #7 EtOH use disorder-abstinent over the past 1 month  Plan; as above, continue current doses of lactulose Continue vitamin K  5 mg IV daily x 3 Continue NAC Have ordered noncontrasted CT abdomen and pelvis    Principal Problem:   GI bleed Active Problems:   Alcohol use disorder   Hyponatremia   Tobacco abuse   Nausea vomiting and diarrhea   Acute on chronic blood loss anemia   Liver cirrhosis (HCC)   Esophageal necrosis   Decompensated cirrhosis (HCC)   ABLA (acute blood loss anemia)   AKI (acute kidney injury)     LOS: 13 days   Amy EsterwoodPA-C  10/16/2023, 11:06 AM    ATTENDING ADDENDUM Urine output has improved and renal function slightly better today. She is s/p paracentesis yesterday and negative for SBP. Bili slightly better, however INR has risen despite vitamin K . Will continue to give vitamin K , change to oral.  Complicated history, see yesterday's note for details. She continues to have abdominal pain, suspected appendicitis on 10/2 managed nonoperatively given her surgical risks. Given persistent / worsening pain, will repeat CT scan abdomen / pelvis today (noncontrast given renal function).  Otherwise, unable to give much diuretics in light of renal function. Continue midocrine and albumin  per renal.   Continuing protonix  and carafate  in light of  esophagitis and history of PUD.   Lactulose being given for encephalopathy, now having diarrhea, will continue regimen.  Overall, I suspect she has worsening hepatic decompensation in the setting of recent illness, complicated hospital course with GI bleeding and appendicitis. Now with AKI, which seems to be improving. Holding off on empiric steroids (in case component of alcoholic hepatitis, given suspected appendicitis, will await repeat CT). Continuing empiric NAC.   Marcey Naval, MD [facility]

## 2023-10-16 NOTE — Progress Notes (Signed)
 Occupational Therapy Treatment Patient Details Name: Joan Wood MRN: [id number] DOB: 08-24-1970 Today's Date: 10/16/2023   History of present illness 53 y.o. female [facility] to [facility] hospital on 10/03/2023 with nausea/vomiting and bloody diarrhea. Pt underwent upper EGD on 9/25, concerning for possible acute esophageal necrosis. Pt also underwent paracentesis on 9/25 and 10/6. PMH includes decompensated cirrhosis, PUD, GERD, pancreatitis, OSA.   OT comments  Patient supine, alert but internally distracted by pain.  Requires increased assist today, mod assist for bed mobility to EOB, contact guard sitting EOB, mod assist +2 to stand using RW, and total assist +2 to return supine. Limited by dizziness, nausea and pain.  Bed, gown soiled with drainage.  Total assist +2 for LB Adls and mod assist for UB ADLs.  Pt with decreased attention, problem solving and recall during session, pt repeating self and requiring redirection to task. Will reassess goals next session and adjust as needed.  Continue to recommend <3hrs/day inpatient setting at dc.    BP sitting EOB 84/57 BP after standing 71/42      If plan is discharge home, recommend the following:  Direct supervision/assist for medications management;Direct supervision/assist for financial management;Two people to help with walking and/or transfers;Two people to help with bathing/dressing/bathroom;Supervision due to cognitive status   Equipment Recommendations  BSC/3in1    Recommendations for Other Services      Precautions / Restrictions Precautions Precautions: Fall Recall of Precautions/Restrictions: Impaired Precaution/Restrictions Comments: orthostatic Restrictions Weight Bearing Restrictions Per Provider Order: No       Mobility Bed Mobility Overal bed mobility: Needs Assistance Bed Mobility: Sit to Supine, Supine to Sit     Supine to sit: Mod assist, +2 for safety/equipment, HOB elevated, Used rails Sit to supine: Total  assist, +2 for physical assistance   General bed mobility comments: Assist for RLE advancement and trunk elevation. Step by step cues due to decreased initiation and internal distraction. Increased assist to return to supine due to pain and dizziness    Transfers Overall transfer level: Needs assistance Equipment used: Rolling walker (2 wheels) Transfers: Sit to/from Stand Sit to Stand: Mod assist, +2 physical assistance           General transfer comment: mod A +2 for power up and cues for hand placement. Limited standing tolerance due to dizziness and nausea     Balance Overall balance assessment: Needs assistance Sitting-balance support: No upper extremity supported, Feet supported, Bilateral upper extremity supported Sitting balance-Leahy Scale: Fair Sitting balance - Comments: CGA sitting EOB statically   Standing balance support: Bilateral upper extremity supported, Reliant on assistive device for balance, During functional activity Standing balance-Leahy Scale: Poor Standing balance comment: reliant on RW support                           ADL either performed or assessed with clinical judgement   ADL Overall ADL's : Needs assistance/impaired     Grooming: Contact guard assist;Sitting Grooming Details (indicate cue type and reason): to wipe mouth         Upper Body Dressing : Moderate assistance;Sitting Upper Body Dressing Details (indicate cue type and reason): new gown Lower Body Dressing: Total assistance;+2 for physical assistance;Sit to/from Market researcher Details (indicate cue type and reason): deferred, stood at EOB with mod assist +2         Functional mobility during ADLs: Moderate assistance;+2 for physical assistance  Extremity/Trunk Assessment Upper Extremity Assessment Upper Extremity Assessment: Generalized weakness            Vision       Perception     Praxis     Communication  Communication Communication: No apparent difficulties   Cognition Arousal: Alert Behavior During Therapy: Lability, Anxious Cognition: Cognition impaired     Awareness: Intellectual awareness impaired Memory impairment (select all impairments): Short-term memory, Working memory Attention impairment (select first level of impairment): Focused attention Executive functioning impairment (select all impairments): Initiation, Organization, Sequencing, Problem solving, Reasoning OT - Cognition Comments: pt with poor recall and attention, asking the same questions mulitple times (can you take this catheter out, what time is it).. requires reassurance and redirection thoughout session                 Following commands: Impaired Following commands impaired: Follows one step commands with increased time, Follows multi-step commands inconsistently      Cueing   Cueing Techniques: Verbal cues, Visual cues, Tactile cues  Exercises      Shoulder Instructions       General Comments symptomatic orthostatic hypotension. BP sitting EOB 84/57; sitting after standing trial 71/42    Pertinent Vitals/ Pain       Pain Assessment Pain Assessment: Faces Faces Pain Scale: Hurts whole lot Pain Location: stomach, bottom, foley site Pain Descriptors / Indicators: Aching, Discomfort Pain Intervention(s): Limited activity within patient's tolerance, Monitored during session, Repositioned  Home Living                                          Prior Functioning/Environment              Frequency  Min 2X/week        Progress Toward Goals  OT Goals(current goals can now be found in the care plan section)  Progress towards OT goals: Not progressing toward goals - comment;OT to reassess next treatment (pain, needing increased assistance)  Acute Rehab OT Goals Patient Stated Goal: less pain OT Goal Formulation: With patient Time For Goal Achievement:  10/24/23 Potential to Achieve Goals: Fair  Plan      Co-evaluation    PT/OT/SLP Co-Evaluation/Treatment: Yes Reason for Co-Treatment: For patient/therapist safety;To address functional/ADL transfers PT goals addressed during session: Mobility/safety with mobility;Balance OT goals addressed during session: ADL's and self-care      AM-PAC OT 6 Clicks Daily Activity     Outcome Measure   Help from another person eating meals?: A Little Help from another person taking care of personal grooming?: A Little Help from another person toileting, which includes using toliet, bedpan, or urinal?: Total Help from another person bathing (including washing, rinsing, drying)?: A Lot Help from another person to put on and taking off regular upper body clothing?: A Lot Help from another person to put on and taking off regular lower body clothing?: Total 6 Click Score: 12    End of Session Equipment Utilized During Treatment: Rolling walker (2 wheels)  OT Visit Diagnosis: Other abnormalities of gait and mobility (R26.89);Muscle weakness (generalized) (M62.81);Pain;Other symptoms and signs involving cognitive function Pain - part of body:  (stomach, generalized)   Activity Tolerance Patient limited by pain   Patient Left in bed;with call bell/phone within reach;with bed alarm set   Nurse Communication Mobility status;Other (comment) (on bed pan)        Time:  8953-8881 OT Time Calculation (min): 32 min  Charges: OT General Charges $OT Visit: 1 Visit OT Treatments $Self Care/Home Management : 8-22 mins  Etta NOVAK, OT Acute Rehabilitation Services Office [phone] Secure Chat Preferred    Etta GORMAN Hope 10/16/2023, 1:01 PM

## 2023-10-16 NOTE — Progress Notes (Signed)
 [facility] Progress Note   Subjective:   ongoing abdominal discomfort but improved after LVP. UOP 1.3L. IR LVP 4L yesterday. Cr improved 1.2 from 1.4 today.  K 3.3. Na stable 125.   Objective Vitals:   10/16/23 0358 10/16/23 0600 10/16/23 0735 10/16/23 0751  BP: (!) 86/50 (!) 88/61 124/86 (!) 92/55  Pulse: 92  87   Resp: 18  15   Temp: 98.1 F (36.7 C)  98.2 F (36.8 C)   TempSrc: Oral  Oral   SpO2: 93%  100%   Weight:      Height:       Physical Exam Gen: chronically ill but nontoxic appearing   Eyes: icteric, EOMI ENT:MMM CV: RRR Abd: softer but still distended, mildly diffuse TTP, no rebound or guarding Back: clear lungs GU: no foley Extr: 2+ pitting LE edema in hips mainly Neuro: AOx3 but does lose train of thought and is a bit tangential at times Skin: jaundiced  Additional Objective Labs: Basic Metabolic Panel: Recent Labs  Lab 10/13/23 0852 10/14/23 0452 10/15/23 0528 10/16/23 0534  NA 128* 127* 125* 125*  K 2.8* 4.1 4.1 3.3*  CL 93* 94* 93* 92*  CO2 23 23 19* 20*  GLUCOSE 103* 121* 102* 93  BUN <5* <5* <5* 6  CREATININE 0.58 1.10* 1.40* 1.20*  CALCIUM  8.1* 8.6* 9.0 9.2  PHOS 3.7  --  3.9 3.3   Liver Function Tests: Recent Labs  Lab 10/14/23 0452 10/15/23 0528 10/16/23 0534  AST 99* 87* 58*  ALT 28 29 18   ALKPHOS 34* 31* 26*  BILITOT 12.5* 13.4* 10.5*  PROT 5.2* 5.6* 5.3*  ALBUMIN  2.4* 2.8* 3.4*   No results for input(s): LIPASE, AMYLASE in the last 168 hours. CBC: Recent Labs  Lab 10/12/23 0151 10/13/23 0852 10/14/23 0452 10/15/23 0528 10/16/23 0534  WBC 5.5 9.1 10.1 10.3 6.8  NEUTROABS 3.1 6.7 7.4 8.4* 5.0  HGB 7.5* 9.9* 9.6* 10.0* 7.8*  HCT 22.3* 28.3* 27.6* 29.2* 22.7*  MCV 98.7 92.2 92.9 94.2 95.4  PLT 86* 99* 104* 89* 70*   Blood Culture    Component Value Date/Time   SDES ABDOMEN PERITONEAL 10/15/2023 1431   SDES ABDOMEN PERITONEAL 10/15/2023 1431   SPECREQUEST NONE 10/15/2023 1431   SPECREQUEST   10/15/2023 1431    BOTTLES DRAWN AEROBIC AND ANAEROBIC Performed at [facility], [address], [place], [name] [address]    CULT PENDING 10/15/2023 1431   REPTSTATUS 10/15/2023 FINAL 10/15/2023 1431   REPTSTATUS PENDING 10/15/2023 1431    Cardiac Enzymes: No results for input(s): CKTOTAL, CKMB, CKMBINDEX, TROPONINI in the last 168 hours. CBG: Recent Labs  Lab 10/15/23 1708 10/15/23 1952 10/16/23 0011 10/16/23 0400 10/16/23 0740  GLUCAP 97 113* 108* 90 88   Iron Studies: No results for input(s): IRON, TIBC, TRANSFERRIN, FERRITIN in the last 72 hours. @lablastinr3 @ Studies/Results: IR Paracentesis Result Date: 10/15/2023 INDICATION: 53 year old female with alcoholic cirrhosis and ascites. Request for therapeutic and diagnostic paracentesis up to 4 L. EXAM: ULTRASOUND GUIDED  PARACENTESIS MEDICATIONS: 10 mL 1% lidocaine  COMPLICATIONS: None immediate. PROCEDURE: Informed written consent was obtained from the patient after a discussion of the risks, benefits and alternatives to treatment. A timeout was performed prior to the initiation of the procedure. Initial ultrasound scanning demonstrates a large amount of ascites within the right lower abdominal quadrant. The right lower abdomen was prepped and draped in the usual sterile fashion. 1% lidocaine  was used for local anesthesia. Following this, a 5  [place] Cook centesis catheter was introduced. An ultrasound image was saved for documentation purposes. The paracentesis was performed. The catheter was removed and a dressing was applied. The patient tolerated the procedure well without immediate post procedural complication. FINDINGS: A total of approximately 4 L of hazy yellow fluid was removed. Samples were sent to the laboratory as requested by the clinical team. IMPRESSION: Successful ultrasound-guided paracentesis yielding 4 liters of peritoneal fluid. PLAN: If the patient eventually requires >/=2 paracenteses in a 30  day period, candidacy for formal evaluation by the [name] Interventional Radiology Portal Hypertension Clinic will be assessed. Performed by: Aimee Han, PA-C. Electronically Signed   By: [name].  Shick M.D.   On: 10/15/2023 14:54   CT FEMUR RIGHT WO CONTRAST Result Date: 10/14/2023 CLINICAL DATA:  Right lower extremity swelling and pain. History of decompensated alcoholic cirrhosis with ascites for which the patient underwent paracentesis yesterday. EXAM: CT OF THE LOWER RIGHT EXTREMITY WITHOUT CONTRAST TECHNIQUE: Multidetector CT imaging of the right thigh was performed according to the standard protocol. RADIATION DOSE REDUCTION: This exam was performed according to the departmental dose-optimization program which includes automated exposure control, adjustment of the mA and/or kV according to patient size and/or use of iterative reconstruction technique. COMPARISON:  Abdominopelvic CT 10/11/2023 and 10/03/2023. FINDINGS: Bones/Joint/Cartilage No evidence of acute fracture, dislocation or femoral head osteonecrosis. No significant arthropathy or effusions identified at the right hip or knee. Ligaments Suboptimally assessed by CT. Muscles and Tendons The right thigh muscles and tendons appear unremarkable. Soft tissues Moderate generalized subcutaneous edema throughout the right thigh, the proximal extent of which appears similar to recent pelvic CT. There appears to be similar subcutaneous edema within the left thigh, incompletely visualized. No deep soft tissue inflammatory changes, organized fluid collections, foreign bodies or soft tissue emphysema identified. Moderate to large amount of pelvic ascites again noted. IMPRESSION: 1. Moderate generalized subcutaneous edema throughout both visualized size, similar to recent pelvic CT. This finding is nonspecific and suboptimally evaluated by noncontrast CT. Differential includes anasarca/third-spacing and cellulitis. Correlate clinically. 2. No deep soft tissue  inflammatory changes, organized fluid collections, foreign bodies or soft tissue emphysema identified. 3. No acute or significant osseous findings. 4. Moderate to large amount of pelvic ascites again noted. Electronically Signed   By: Elsie Perone M.D.   On: 10/14/2023 14:25   Medications:  cefTRIAXone  (ROCEPHIN )  IV 2 g (10/16/23 0824)   phytonadione  (VITAMIN K ) 5 mg in dextrose  5 % 50 mL IVPB 5 mg (10/15/23 1809)    bisacodyl   10 mg Rectal Once   calcium  carbonate  1 tablet Oral TID WC   Chlorhexidine  Gluconate Cloth  6 each Topical Daily   feeding supplement  237 mL Oral BID BM   folic acid   1 mg Oral Daily   gabapentin   300 mg Oral TID   lactulose  30 g Oral TID   lactulose  300 mL Rectal BID   lidocaine   2 patch Transdermal Q24H   metroNIDAZOLE   500 mg Oral Q12H   midodrine   15 mg Oral Q8H   multivitamin with minerals  1 tablet Oral Daily   nicotine   21 mg Transdermal Daily   pantoprazole   40 mg Oral BID   sucralfate   1 g Oral Q6H   thiamine   100 mg Oral Daily    Assessment/PlanJill R Wood is an 53 y.o. 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     **AKI, oliguric:  Baseline Cr 0.4, rose to 0.6 on 10/4, 1.1 10/5, 1.4 10/6.  She had CT w contrast 10/2. BP chronically low 80/50s -90/50s already on midodrine .  UA last 10/1 wasn't a clean catch - repeat 10/6 - looks bland.   HRS dx exclusion and in the context of recent known nephrotoxin exposure (contrast) cannot make this diagnosis.   She had albumin  challenge already and in the setting of volume overload would not continue except if LVP being done.   OK for dose of IV lasix 20 today given volume overload and not hypovolemic.  Reordered urine sodium but with creatinine improving today will hold on starting octreotide .   With new LUTs and severe AKI placed foley 10/6 - not clear if there was  retention but continue foley for now.   Cont daily labs, strict I/Os, avoid nephrotoxins.  No current indications for RRT - poor candidate for dialysis should need arise.     **Hyponatremia:  In the setting of decompensated cirrhosis, hypervolemia and severe AKI.  Sodium 125 today, continue daily labs, lasix as above.     **Cirrhosis, decompensated:  alcohol in etiology.  GI following.  Ok for moderate volume paracentesis PRN + IV albumin  25g/L on cue to paracentesis.     **Anemia:  ABLA in the setting of recent ulcers, severe esophagitis. Hb stable in 9-10s but today droppted to 7.8. On PPI and carafate  per GI. GI continues to follow.     **Possible appendicitis:  surgery recommended abx, high risk for surgery and no plans to proceed to OR.  If need for reimaging arises please avoid contrast.    Will follow, reach out with concerns.   Manuelita Barters MD 10/16/2023, 8:48 AM   [facility] Pager: [phone]

## 2023-10-16 NOTE — Progress Notes (Signed)
 PROGRESS NOTE    Joan Wood  FMW:[id number] DOB: 10-May-1970 DOA: 10/03/2023 PCP: Edman Meade PEDLAR, FNP  Chief Complaint  Patient presents with   Emesis   Nausea   Diarrhea    Brief Narrative:   Joan Wood is Joan Wood 53 year old female with decompensated cirrhosis, PUD with recent gastric ulcer perforation s/p ex-lap, GERD, pancreatitis, OSA who presented to [facility] with N/V/bloody diarrhea x 4 days.   She had coffee ground emesis in the ED. CTA GI neg for acute bleed, large ascites and new distal esophageal thickening.   GI consulted for acute blood loss anemia with Hg 8.3>7. She was given PPI, started on octreotide  gtt, rocephin , vitamin K , pain and nausea control and admitted to hospitalist.   Now s/p upper endoscopy showing black discoloration of esophageal mucosa concerning for acute esophageal necrosis, portal hypertension, non-bleeding gastric and duodenal ulcers.   Transferred to [facility] ICU for further management.   Significant Events 9/24: ED for upper GI bleed admit to hospitalist  9/25: 1 U PRBC, GI upper endoscopy w/ acute esophageal necrosis with recommendation for transfer to [facility] > NPO, s/p paracentesis 3.35L drained Transferred to hospitalists service 9/27 GI signed off 9/30 10/1 US  without sufficient ascites to tap 10/3 CT with findings concerning for appendicitis, surgery recommending antibiotics (high risk for surgery).  GI reconsulted to to progressive liver failure.  10/4 diagnostic para without evidence SBP 10/5 diuretics held with doubling creatinine 10/6 para with 4 L off.  Renal consult for progressive AKI.  Assessment & Plan:   Principal Problem:   GI bleed Active Problems:   Tobacco abuse   Alcohol use disorder   Hyponatremia   Nausea vomiting and diarrhea   Acute on chronic blood loss anemia   Liver cirrhosis (HCC)   Esophageal necrosis   Decompensated cirrhosis (HCC)   ABLA (acute blood loss anemia)   AKI (acute kidney injury)  Decompensated  Alcoholic Cirrhosis Hepatic Encephalopathy  Ascites  Bilateral Pleural Effusions  Hypoalbuminemia  Thrombocytopenia  Bili fluctuating, INR rising S/p 5 days ceftriaxone  (now on abx with appendicitis below) s/p paracentesis 9/25 3.3L removed. (SBP ruled out) Repeat US  10/1 without sufficient ascites to tap 10/4 para without evidence of SBP, follow culture Repeat paracentesis 10/6 with 4 L off, sbp ruled out (received albumin ) She's remains volume overloaded - appreciate renal assistance with volume/diuretics (IV lasix x1 again today) Midodrine  for hypotension Continued asterixis on exam today, lactulose for hepatic encephalopathy Appreciate GI recommendations - s/p trial of NAC Echo with preserved EF INR and bili progressively worsening  Meld 10/2 was 32, 52.6% 3 month mortality    Acute Kidney Injury Volume Overload  Creatinine doubled 10/5 1.2 (mildly improved from yesterday) Renal consult in setting of cirrhosis above - considering addition of octreotide  based on continued trend and urine sodium Place foley (1.3 L out yesterday) Volume per renal - dose of IV lasix today Follow UA (bland), urine sodium (pending)  Appendicitis Appreciate gen surgery, recommending abx -ok for diet she's high risk for surgery in setting of her cirrhosis Repeat CT abd/pelvis today 10/7  Acute upper GI bleed Acute esophageal necrosis Acute blood loss anemia Patient has h/o PUD, gastric ulcer perforation s/p sx lap 07/2023. Now s/p EGD showing grade D esophagitis, black discoloration mucosa in esophagus concerning for acute esophageal necrosis, portal hypertensive gastropathy, non bleeding gastric ulcer with clean ulcer base with apparent sutures.  No bleeding duodenal ulcers with Nyeem Stoke clean ulcer base.  No evidence of active  bleeding or varices.   Transferred to [facility] given findings concerning for acute esophageal necrosis Gastrin level wnl  GI recommending carafate  x14 days, BID PPI until follow up  with GI primary at [facility].  Needs ensure 2-3 times daily.   Hb fluctuating, drop today, repeat H/H this PM  Abdominal Pain Suspect related to abdominal distension from ascites +/- possible appendicitis  Repeat CT scan as noted   Orthostatic Hypotension S/p 1 unit pRBC midodrine  Follow orthostatics   Right Lower Extremity Edema Right Lower Extremity Pain Follow LE US  - no evidence of deep vein thrombosis in lower extremity CT with moderate generalized subcutaneous edema throughout both visualized size, similar to recent pelvic CT (anasarca/third spacing vs cellulitis) She's on abx which would cover cellulitis, though lower suspicion for this Pain radiating all down leg, ? Neuropathic pain, Continue gabapentin    Hypervolemic Hyponatremia fluctuating Appreciate renal assistance with volume and hyponatremia   Hypomagnesemia  Hypophosphatemia  Hypokalemia Follow, replace and follow    Tobacco abuse Continue nicotine  patch.   Alcohol abuse CIWA protocol. Continue thiamine , folate, MVT.  Severe Protein Calorie Malnutrition Body mass index is 28.95 kg/m. RD    DVT prophylaxis: SCD Code Status: full Family Communication: none Disposition:   Status is: Inpatient Remains inpatient appropriate because: need for continued inpatient care   Consultants:  GI  Procedures:   Upper endoscopy - grade D esophagitis, black discoloration mucosa in esophagus concerning for acute esophageal necrosis, portal hypertensive gastropathy, nonbleeding gastric ulcer with clean base with apparent sutures, non bleeding duodenal ulcers with clean base  Echo IMPRESSIONS     1. Left ventricular ejection fraction, by estimation, is 70 to 75%. The  left ventricle has hyperdynamic function. The left ventricle has no  regional wall motion abnormalities. Left ventricular diastolic parameters  were normal.   2. Right ventricular systolic function is normal. The right ventricular  size  is normal. Tricuspid regurgitation signal is inadequate for assessing  PA pressure.   3. The mitral valve is grossly normal. Trivial mitral valve  regurgitation.   4. The aortic valve is tricuspid. Aortic valve regurgitation is not  visualized.   5. The inferior vena cava is normal in size with greater than 50%  respiratory variability, suggesting right atrial pressure of 3 mmHg.   Comparison(s): No prior Echocardiogram.   LE US  Summary:  RIGHT:      - There is no evidence of deep vein thrombosis in the lower extremity.    - No cystic structure found in the popliteal fossa.    LEFT:  - No evidence of common femoral vein obstruction.  Antimicrobials:  Anti-infectives (From admission, onward)    Start     Dose/Rate Route Frequency Ordered Stop   10/12/23 1000  metroNIDAZOLE  (FLAGYL ) tablet 500 mg        500 mg Oral Every 12 hours 10/12/23 0734     10/12/23 0830  cefTRIAXone  (ROCEPHIN ) 2 g in sodium chloride  0.9 % 100 mL IVPB        2 g 200 mL/hr over 30 Minutes Intravenous Every 24 hours 10/12/23 0734     10/07/23 1000  fluconazole  (DIFLUCAN ) tablet 100 mg  Status:  Discontinued        100 mg Oral Daily 10/07/23 0853 10/07/23 0855   10/07/23 1000  fluconazole  (DIFLUCAN ) tablet 150 mg        150 mg Oral Daily 10/07/23 0855 10/07/23 1014   10/04/23 1000  cefTRIAXone  (ROCEPHIN ) 2 g in sodium  chloride 0.9 % 100 mL IVPB        2 g 200 mL/hr over 30 Minutes Intravenous Every 24 hours 10/03/23 2154 10/08/23 0854   10/03/23 2015  cefTRIAXone  (ROCEPHIN ) 1 g in sodium chloride  0.9 % 100 mL IVPB        1 g 200 mL/hr over 30 Minutes Intravenous  Once 10/03/23 2010 10/03/23 2212       Subjective: C/o continued pain Abdominal pain, burning bilateral leg pain   Objective: Vitals:   10/16/23 0600 10/16/23 0735 10/16/23 0751 10/16/23 1207  BP: (!) 88/61 124/86 (!) 92/55 (!) 96/55  Pulse:  87  90  Resp:  15  15  Temp:  98.2 F (36.8 C)  98.6 F (37 C)  TempSrc:  Oral  Oral   SpO2:  100%  100%  Weight:      Height:        Intake/Output Summary (Last 24 hours) at 10/16/2023 1308 Last data filed at 10/16/2023 0700 Gross per 24 hour  Intake 120 ml  Output 1300 ml  Net -1180 ml   Filed Weights   10/09/23 0500 10/12/23 0500 10/13/23 0500  Weight: 41.9 kg 69.7 kg 71.8 kg    Examination:  General: No acute distress. Cardiovascular: RRR Lungs: unlabored Abdomen: tight, distended, TTP Neurological: Alert and oriented 3. Moves all extremities 4 with equal strength. Cranial nerves II through XII grossly intact.  + asterixis. Extremities: significant bilateral LE edema - TTP to bilateral thighs   Data Reviewed: I have personally reviewed following labs and imaging studies  CBC: Recent Labs  Lab 10/12/23 0151 10/13/23 0852 10/14/23 0452 10/15/23 0528 10/16/23 0534  WBC 5.5 9.1 10.1 10.3 6.8  NEUTROABS 3.1 6.7 7.4 8.4* 5.0  HGB 7.5* 9.9* 9.6* 10.0* 7.8*  HCT 22.3* 28.3* 27.6* 29.2* 22.7*  MCV 98.7 92.2 92.9 94.2 95.4  PLT 86* 99* 104* 89* 70*    Basic Metabolic Panel: Recent Labs  Lab 10/11/23 0407 10/12/23 0151 10/13/23 0852 10/14/23 0452 10/15/23 0528 10/16/23 0534  NA 126* 128* 128* 127* 125* 125*  K 4.7 3.9 2.8* 4.1 4.1 3.3*  CL 96* 98 93* 94* 93* 92*  CO2 25 21* 23 23 19* 20*  GLUCOSE 89 99 103* 121* 102* 93  BUN <5* <5* <5* <5* <5* 6  CREATININE 0.41* 0.40* 0.58 1.10* 1.40* 1.20*  CALCIUM  8.0* 7.9* 8.1* 8.6* 9.0 9.2  MG 1.6* 2.0 1.7 1.7 1.8 1.8  PHOS 1.8* 5.0* 3.7  --  3.9 3.3    GFR: Estimated Creatinine Clearance: 49.7 mL/min (Harless Molinari) (by C-G formula based on SCr of 1.2 mg/dL (H)).  Liver Function Tests: Recent Labs  Lab 10/12/23 0151 10/13/23 0852 10/14/23 0452 10/15/23 0528 10/16/23 0534  AST 78* 108* 99* 87* 58*  ALT 23 29 28 29 18   ALKPHOS 23* 30* 34* 31* 26*  BILITOT 13.0* 12.7* 12.5* 13.4* 10.5*  PROT 5.0* 5.6* 5.2* 5.6* 5.3*  ALBUMIN  2.8* 2.8* 2.4* 2.8* 3.4*    CBG: Recent Labs  Lab 10/15/23 1952  10/16/23 0011 10/16/23 0400 10/16/23 0740 10/16/23 1208  GLUCAP 113* 108* 90 88 100*     Recent Results (from the past 240 hours)  Body fluid culture w Gram Stain     Status: None   Collection Time: 10/13/23 11:37 AM   Specimen: Abdomen; Peritoneal Fluid  Result Value Ref Range Status   Specimen Description PERITONEAL  Final   Special Requests ABDOMEN  Final   Gram Stain  Final    RARE WBC PRESENT, PREDOMINANTLY PMN NO ORGANISMS SEEN    Culture   Final    NO GROWTH 3 DAYS Performed at [facility], [address], [place], [name] [address]    Report Status 10/16/2023 FINAL  Final  Gram stain     Status: None   Collection Time: 10/15/23  2:31 PM   Specimen: Abdomen; Peritoneal Fluid  Result Value Ref Range Status   Specimen Description ABDOMEN PERITONEAL  Final   Special Requests NONE  Final   Gram Stain   Final    WBC PRESENT, PREDOMINANTLY MONONUCLEAR NO ORGANISMS SEEN CYTOSPIN SMEAR Performed at [facility], [address] [place], [name] [address]    Report Status 10/15/2023 FINAL  Final  Culture, body fluid w Gram Stain-bottle     Status: None (Preliminary result)   Collection Time: 10/15/23  2:31 PM   Specimen: Abdomen  Result Value Ref Range Status   Specimen Description ABDOMEN PERITONEAL  Final   Special Requests BOTTLES DRAWN AEROBIC AND ANAEROBIC  Final   Culture   Final    NO GROWTH < 24 HOURS Performed at [facility], [address] [place], [name] [address]    Report Status PENDING  Incomplete         Radiology Studies: IR Paracentesis Result Date: 10/15/2023 INDICATION: 53 year old female with alcoholic cirrhosis and ascites. Request for therapeutic and diagnostic paracentesis up to 4 L. EXAM: ULTRASOUND GUIDED  PARACENTESIS MEDICATIONS: 10 mL 1% lidocaine  COMPLICATIONS: None immediate. PROCEDURE: Informed written consent was obtained from the patient after Schneider Warchol discussion of the risks, benefits and alternatives to  treatment. Alejah Aristizabal timeout was performed prior to the initiation of the procedure. Initial ultrasound scanning demonstrates Michal Callicott large amount of ascites within the right lower abdominal quadrant. The right lower abdomen was prepped and draped in the usual sterile fashion. 1% lidocaine  was used for local anesthesia. Following this, Yamir Carignan 5 [place] Cook centesis catheter was introduced. An ultrasound image was saved for documentation purposes. The paracentesis was performed. The catheter was removed and Malahki Gasaway dressing was applied. The patient tolerated the procedure well without immediate post procedural complication. FINDINGS: Arrietty Dercole total of approximately 4 L of hazy yellow fluid was removed. Samples were sent to the laboratory as requested by the clinical team. IMPRESSION: Successful ultrasound-guided paracentesis yielding 4 liters of peritoneal fluid. PLAN: If the patient eventually requires >/=2 paracenteses in Jasin Brazel 30 day period, candidacy for formal evaluation by the [facility] Interventional Radiology Portal Hypertension Clinic will be assessed. Performed by: Aimee Han, PA-C. Electronically Signed   By: [name].  Shick M.D.   On: 10/15/2023 14:54        Scheduled Meds:  bisacodyl   10 mg Rectal Once   calcium  carbonate  1 tablet Oral TID WC   Chlorhexidine  Gluconate Cloth  6 each Topical Daily   feeding supplement  237 mL Oral BID BM   folic acid   1 mg Oral Daily   gabapentin   300 mg Oral TID   lactulose  30 g Oral TID   lactulose  300 mL Rectal BID   lidocaine   2 patch Transdermal Q24H   metroNIDAZOLE   500 mg Oral Q12H   midodrine   15 mg Oral Q8H   multivitamin with minerals  1 tablet Oral Daily   nicotine   21 mg Transdermal Daily   pantoprazole   40 mg Oral BID   sucralfate   1 g Oral Q6H   thiamine   100 mg  Oral Daily   Continuous Infusions:  cefTRIAXone  (ROCEPHIN )  IV 2 g (10/16/23 0824)   phytonadione  (VITAMIN K ) 5 mg in dextrose  5 % 50 mL IVPB 5 mg (10/16/23 1222)     LOS: 13 days    Time spent: over 30 min      Meliton Monte, MD [facility]   To contact the attending provider between 7A-7P or the covering provider during after hours 7P-7A, please log into the web site [email or web address] and access using universal [place] password for that web site. If you do not have the password, please call the hospital operator.  10/16/2023, 1:08 PM

## 2023-10-17 DIAGNOSIS — D62 Acute posthemorrhagic anemia: Secondary | ICD-10-CM | POA: Diagnosis not present

## 2023-10-17 DIAGNOSIS — L8990 Pressure ulcer of unspecified site, unspecified stage: Secondary | ICD-10-CM | POA: Insufficient documentation

## 2023-10-17 DIAGNOSIS — K7290 Hepatic failure, unspecified without coma: Secondary | ICD-10-CM | POA: Diagnosis not present

## 2023-10-17 DIAGNOSIS — E43 Unspecified severe protein-calorie malnutrition: Secondary | ICD-10-CM | POA: Insufficient documentation

## 2023-10-17 DIAGNOSIS — N179 Acute kidney failure, unspecified: Secondary | ICD-10-CM | POA: Diagnosis not present

## 2023-10-17 DIAGNOSIS — K2289 Other specified disease of esophagus: Secondary | ICD-10-CM | POA: Diagnosis not present

## 2023-10-17 DIAGNOSIS — K3580 Unspecified acute appendicitis: Secondary | ICD-10-CM

## 2023-10-17 DIAGNOSIS — K7031 Alcoholic cirrhosis of liver with ascites: Secondary | ICD-10-CM | POA: Diagnosis not present

## 2023-10-17 DIAGNOSIS — K7682 Hepatic encephalopathy: Secondary | ICD-10-CM

## 2023-10-17 LAB — RENAL FUNCTION PANEL
Albumin: 2.9 g/dL — ABNORMAL LOW (ref 3.5–5.0)
Anion gap: 11 (ref 5–15)
BUN: 6 mg/dL (ref 6–20)
CO2: 22 mmol/L (ref 22–32)
Calcium: 9 mg/dL (ref 8.9–10.3)
Chloride: 92 mmol/L — ABNORMAL LOW (ref 98–111)
Creatinine, Ser: 0.9 mg/dL (ref 0.44–1.00)
GFR, Estimated: >60 mL/min (ref >60)
Glucose, Bld: 105 mg/dL — ABNORMAL HIGH (ref 70–99)
Phosphorus: 2.6 mg/dL (ref 2.5–4.6)
Potassium: 3.5 mmol/L (ref 3.5–5.1)
Sodium: 125 mmol/L — ABNORMAL LOW (ref 135–145)

## 2023-10-17 LAB — GLUCOSE, CAPILLARY
Glucose-Capillary: 106 mg/dL — ABNORMAL HIGH (ref 70–99)
Glucose-Capillary: 87 mg/dL (ref 70–99)
Glucose-Capillary: 90 mg/dL (ref 70–99)
Glucose-Capillary: 123 mg/dL — ABNORMAL HIGH (ref 70–99)
Glucose-Capillary: 131 mg/dL — ABNORMAL HIGH (ref 70–99)

## 2023-10-17 LAB — CBC
HCT: 24.6 % — ABNORMAL LOW (ref 36.0–46.0)
Hemoglobin: 8.6 g/dL — ABNORMAL LOW (ref 12.0–15.0)
MCH: 32.7 pg (ref 26.0–34.0)
MCHC: 35 g/dL (ref 30.0–36.0)
MCV: 93.5 fL (ref 80.0–100.0)
Platelets: 82 K/uL — ABNORMAL LOW (ref 150–400)
RBC: 2.63 MIL/uL — ABNORMAL LOW (ref 3.87–5.11)
RDW: 20.8 % — ABNORMAL HIGH (ref 11.5–15.5)
WBC: 8.7 K/uL (ref 4.0–10.5)
nRBC: 0 % (ref 0.0–0.2)

## 2023-10-17 LAB — HEPATIC FUNCTION PANEL
ALT: 17 U/L (ref 0–44)
AST: 63 U/L — ABNORMAL HIGH (ref 15–41)
Albumin: 3 g/dL — ABNORMAL LOW (ref 3.5–5.0)
Alkaline Phosphatase: 39 U/L (ref 38–126)
Bilirubin, Direct: 5.4 mg/dL — ABNORMAL HIGH (ref 0.0–0.2)
Indirect Bilirubin: 4.7 mg/dL — ABNORMAL HIGH (ref 0.3–0.9)
Total Bilirubin: 10.1 mg/dL — ABNORMAL HIGH (ref 0.0–1.2)
Total Protein: 5 g/dL — ABNORMAL LOW (ref 6.5–8.1)

## 2023-10-17 LAB — PROTIME-INR
INR: 3.5 — ABNORMAL HIGH (ref 0.8–1.2)
Prothrombin Time: 36.4 s — ABNORMAL HIGH (ref 11.4–15.2)

## 2023-10-17 MED ORDER — ENSURE PLUS HIGH PROTEIN PO LIQD
237.0000 mL | Freq: Three times a day (TID) | ORAL | Status: DC
  Administered 2023-10-17 – 2024-02-12 (×270): 237 mL via ORAL

## 2023-10-17 MED ORDER — FUROSEMIDE 10 MG/ML IJ SOLN
20.0000 mg | Freq: Once | INTRAMUSCULAR | Status: AC
  Administered 2023-10-17: 20 mg via INTRAVENOUS
  Filled 2023-10-17: qty 4

## 2023-10-17 MED ORDER — PHYTONADIONE 5 MG PO TABS
10.0000 mg | ORAL_TABLET | Freq: Every day | ORAL | Status: AC
  Administered 2023-10-18 – 2023-10-20 (×3): 10 mg via ORAL
  Filled 2023-10-17 (×3): qty 2

## 2023-10-17 MED ORDER — FUROSEMIDE 10 MG/ML IJ SOLN
40.0000 mg | Freq: Once | INTRAMUSCULAR | Status: AC
  Administered 2023-10-17: 40 mg via INTRAVENOUS
  Filled 2023-10-17: qty 4

## 2023-10-17 MED ORDER — LACTULOSE 10 GM/15ML PO SOLN
20.0000 g | Freq: Three times a day (TID) | ORAL | Status: DC
  Administered 2023-10-17 – 2023-10-18 (×3): 20 g via ORAL
  Filled 2023-10-17 (×3): qty 30

## 2023-10-17 NOTE — Progress Notes (Addendum)
 [facility] Progress Note   Subjective:  I feel a little better.  Still c/o diffuse abd pain and distention but slightly better.   UOP yesterday.   Objective Vitals:   10/17/23 0012 10/17/23 0322 10/17/23 0500 10/17/23 0621  BP: (!) 103/50 (!) 75/43  (!) 87/41  Pulse: 87 84    Resp: 16 16  17   Temp: 98.9 F (37.2 C) 98.1 F (36.7 C)    TempSrc: Oral     SpO2: 97% 98%  97%  Weight:   68.1 kg   Height:       Physical Exam Gen: chronically ill but nontoxic appearing   Eyes: icteric, EOMI ENT:MMM CV: RRR Abd: softer but still distended, mildly diffuse TTP, no rebound or guarding Back: clear lungs GU: no foley Extr: 2+ pitting LE edema in hips mainly remains Neuro: AOx3 but does lose train of thought and is a bit tangential at times Skin: jaundiced  Additional Objective Labs: Basic Metabolic Panel: Recent Labs  Lab 10/15/23 0528 10/16/23 0534 10/17/23 0142  NA 125* 125* 125*  K 4.1 3.3* 3.5  CL 93* 92* 92*  CO2 19* 20* 22  GLUCOSE 102* 93 105*  BUN <5* 6 6  CREATININE 1.40* 1.20* 0.90  CALCIUM  9.0 9.2 9.0  PHOS 3.9 3.3 2.6   Liver Function Tests: Recent Labs  Lab 10/15/23 0528 10/16/23 0534 10/17/23 0142  AST 87* 58* 63*  ALT 29 18 17   ALKPHOS 31* 26* 39  BILITOT 13.4* 10.5* 10.1*  PROT 5.6* 5.3* 5.0*  ALBUMIN  2.8* 3.4* 3.0*  2.9*   No results for input(s): LIPASE, AMYLASE in the last 168 hours. CBC: Recent Labs  Lab 10/13/23 0852 10/14/23 0452 10/15/23 0528 10/16/23 0534 10/16/23 1613 10/17/23 0142  WBC 9.1 10.1 10.3 6.8  --  8.7  NEUTROABS 6.7 7.4 8.4* 5.0  --   --   HGB 9.9* 9.6* 10.0* 7.8* 8.6* 8.6*  HCT 28.3* 27.6* 29.2* 22.7* 24.7* 24.6*  MCV 92.2 92.9 94.2 95.4  --  93.5  PLT 99* 104* 89* 70*  --  82*   Blood Culture    Component Value Date/Time   SDES ABDOMEN PERITONEAL 10/15/2023 1431   SDES ABDOMEN PERITONEAL 10/15/2023 1431   SPECREQUEST NONE 10/15/2023 1431   SPECREQUEST BOTTLES DRAWN AEROBIC AND  ANAEROBIC 10/15/2023 1431   CULT  10/15/2023 1431    NO GROWTH < 24 HOURS Performed at [facility], [address], [place], [name] [address]    REPTSTATUS 10/15/2023 FINAL 10/15/2023 1431   REPTSTATUS PENDING 10/15/2023 1431    Cardiac Enzymes: No results for input(s): CKTOTAL, CKMB, CKMBINDEX, TROPONINI in the last 168 hours. CBG: Recent Labs  Lab 10/16/23 1208 10/16/23 1648 10/16/23 2006 10/16/23 2331 10/17/23 0315  GLUCAP 100* 120* 118* 113* 106*   Iron Studies: No results for input(s): IRON, TIBC, TRANSFERRIN, FERRITIN in the last 72 hours. @lablastinr3 @ Studies/Results: CT ABDOMEN PELVIS WO CONTRAST Result Date: 10/16/2023 CLINICAL DATA:  Appendicitis, severely decompensated cirrhosis EXAM: CT ABDOMEN AND PELVIS WITHOUT CONTRAST TECHNIQUE: Multidetector CT imaging of the abdomen and pelvis was performed following the standard protocol without IV contrast. RADIATION DOSE REDUCTION: This exam was performed according to the departmental dose-optimization program which includes automated exposure control, adjustment of the mA and/or kV according to patient size and/or use of iterative reconstruction technique. COMPARISON:  10/11/2023 FINDINGS: Lower chest: Small bilateral pleural effusions. Hepatobiliary: No solid liver abnormality is seen. Coarse, nodular, cirrhotic morphology of the liver.  No gallstones. Unchanged gallbladder wall thickening and pericholecystic fluid. No biliary dilatation. Pancreas: Unremarkable. No pancreatic ductal dilatation or surrounding inflammatory changes. Spleen: Normal in size without significant abnormality. Adrenals/Urinary Tract: Adrenal glands are unremarkable. Faint bilateral medullary nephrocalcinosis. No discrete calculi. No hydronephrosis. Bladder is decompressed by Foley catheter. Stomach/Bowel: Stomach is within normal limits. Appendix more difficult to identify on this noncontrast examination although not evidently changed,  measuring up to 1.1 cm in caliber (series 2, image 55). No evidence of bowel wall thickening, distention, or inflammatory changes. Vascular/Lymphatic: Aortic atherosclerosis. No enlarged abdominal or pelvic lymph nodes. Reproductive: No mass or other significant abnormality. Other: No abdominal wall hernia. Severe anasarca. Large volume ascites throughout the abdomen and pelvis, slightly diminished compared to prior examination and keeping with recent therapeutic paracentesis. Musculoskeletal: No acute or significant osseous findings. IMPRESSION: 1. Appendix more difficult to identify on this noncontrast examination although not evidently changed, measuring up to 1.1 cm in caliber. 2. Cirrhosis. 3. Large volume ascites throughout the abdomen and pelvis, slightly diminished compared to prior examination and keeping with recent therapeutic paracentesis. 4. Severe anasarca. 5. Small bilateral pleural effusions. 6. Unchanged gallbladder wall thickening and pericholecystic fluid, nonspecific in the setting of ascites. 7. Faint bilateral medullary nephrocalcinosis. No discrete calculi. No hydronephrosis. Aortic Atherosclerosis (ICD10-I70.0). Electronically Signed   By: Marolyn JONETTA Jaksch M.D.   On: 10/16/2023 14:33   IR Paracentesis Result Date: 10/15/2023 INDICATION: 53 year old female with alcoholic cirrhosis and ascites. Request for therapeutic and diagnostic paracentesis up to 4 L. EXAM: ULTRASOUND GUIDED  PARACENTESIS MEDICATIONS: 10 mL 1% lidocaine  COMPLICATIONS: None immediate. PROCEDURE: Informed written consent was obtained from the patient after a discussion of the risks, benefits and alternatives to treatment. A timeout was performed prior to the initiation of the procedure. Initial ultrasound scanning demonstrates a large amount of ascites within the right lower abdominal quadrant. The right lower abdomen was prepped and draped in the usual sterile fashion. 1% lidocaine  was used for local anesthesia. Following  this, a 5 [place] Cook centesis catheter was introduced. An ultrasound image was saved for documentation purposes. The paracentesis was performed. The catheter was removed and a dressing was applied. The patient tolerated the procedure well without immediate post procedural complication. FINDINGS: A total of approximately 4 L of hazy yellow fluid was removed. Samples were sent to the laboratory as requested by the clinical team. IMPRESSION: Successful ultrasound-guided paracentesis yielding 4 liters of peritoneal fluid. PLAN: If the patient eventually requires >/=2 paracenteses in a 30 day period, candidacy for formal evaluation by the [facility] Interventional Radiology Portal Hypertension Clinic will be assessed. Performed by: Aimee Han, PA-C. Electronically Signed   By: [name].  Shick M.D.   On: 10/15/2023 14:54   Medications:  cefTRIAXone  (ROCEPHIN )  IV Stopped (10/16/23 0854)   phytonadione  (VITAMIN K ) 5 mg in dextrose  5 % 50 mL IVPB Stopped (10/16/23 1322)    bisacodyl   10 mg Rectal Once   calcium  carbonate  1 tablet Oral TID WC   Chlorhexidine  Gluconate Cloth  6 each Topical Daily   feeding supplement  237 mL Oral BID BM   folic acid   1 mg Oral Daily   gabapentin   300 mg Oral TID   lactulose  30 g Oral TID   lidocaine   2 patch Transdermal Q24H   metroNIDAZOLE   500 mg Oral Q12H   midodrine   15 mg Oral Q8H   multivitamin with minerals  1 tablet Oral Daily   nicotine   21 mg Transdermal Daily  pantoprazole   40 mg Oral BID   sucralfate   1 g Oral Q6H   thiamine   100 mg Oral Daily    Assessment/PlanJill R Eskridge is an 53 y.o. 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     **AKI, oliguric:  Baseline Cr 0.4, rose to 0.6 on 10/4, 1.1 10/5, 1.4 10/6.  She had CT w contrast 10/2. BP chronically low 80/50s -90/50s already on midodrine .  UA last 10/1 wasn't a clean  catch - repeat 10/6 - looks bland.   HRS dx exclusion and in the context of recent known nephrotoxin exposure (contrast) cannot make this diagnosis -- she continues to improve and the course of her AKI looks most consistent with contrast nephropathy.   She had albumin  challenge already and in the setting of volume overload would not continue except if LVP being done.   OK for dose of IV lasix 20 today given volume overload and not hypovolemic.  With new LUTs and severe AKI placed foley 10/6 - not clear if there was retention but continue foley for now - would do TOV in the coming days.   Cont daily labs, strict I/Os, avoid nephrotoxins.  No current indications for RRT - poor candidate for dialysis should need arise.     **Hyponatremia:  In the setting of decompensated cirrhosis, hypervolemia and severe AKI.  Sodium 125 today, continue daily labs, lasix as above.     **Cirrhosis, decompensated:  alcohol in etiology.  GI following.  Ok for moderate volume paracentesis PRN + IV albumin  25g/L on cue to paracentesis.   GI can manage diuretics.    **Anemia:  ABLA in the setting of recent ulcers, severe esophagitis. Hb 8.6 today and stable c/w yesterday afternoon. On PPI and carafate  per GI. GI continues to follow.     **Possible appendicitis:  surgery recommended abx, high risk for surgery and no plans to proceed to OR.  If need for reimaging arises please avoid contrast.    Nothing further to add, nephrology will sign off.   Manuelita Barters MD 10/17/2023, 7:33 AM  [facility] Pager: [phone]

## 2023-10-17 NOTE — Progress Notes (Addendum)
 Patient ID: [facility], female   DOB: 1970/03/12, 53 y.o.   MRN: [id number]    Progress Note   Subjective   Day # 13 CC; severely decompensated EtOH induced cirrhosis, ascites, history of previous SBP, currently admitted with acute GI bleed. Recent history of perforated gastric ulcer status post omental patch repair July 2025 Possible acute appendicitis per CT 10/11/2023 covering with IV Rocephin  and metronidazole   Patient says she feels confused about last night but feels like she had a lot of liquid stool.  She is very focused on her diet today, says she does not want to eat this crap and can she have some real food she has been on a 2 g sodium diet, she seems to think she has missed some meals but I am not sure that is accurate. Continues to complain of liquid stool today, wants the Foley out Abdominal pain about the same by her report, no nausea or vomiting   Objective   Vital signs in last 24 hours: Temp:  [98 F (36.7 C)-99.1 F (37.3 C)] 98 F (36.7 C) (10/08 1122) Pulse Rate:  [81-94] 82 (10/08 1122) Resp:  [14-18] 18 (10/08 1122) BP: (75-103)/(41-54) 81/50 (10/08 1122) SpO2:  [97 %-100 %] 99 % (10/08 1122) Weight:  [68.1 kg] 68.1 kg (10/08 0500) Last BM Date : 10/16/23 General:   Older white female in NAD Heart:  Regular rate and rhythm; no murmurs Lungs: Respirations even and unlabored, lungs CTA bilaterally Abdomen: Not as tense as earlier in the week, still with significant ascites, tender across the entire abdomen perhaps a bit more prominent in the right lower quadrant, there is anasarca of the soft tissues along the lower abdominal wall and into the bilateral flanks Extremities:  Without edema. Neurologic:  Alert and oriented x3 positive asterixis, easily loses train of thought, has to be redirected Psych:  Cooperative. Normal mood and affect.  Intake/Output from previous day: 10/07 0701 - 10/08 0700 In: 497.1 [P.O.:200; IV Piggyback:297.1] Out: 490  [Urine:490] Intake/Output this shift: No intake/output data recorded.  Lab Results: Recent Labs    10/15/23 0528 10/16/23 0534 10/16/23 1613 10/17/23 0142  WBC 10.3 6.8  --  8.7  HGB 10.0* 7.8* 8.6* 8.6*  HCT 29.2* 22.7* 24.7* 24.6*  PLT 89* 70*  --  82*   BMET Recent Labs    10/15/23 0528 10/16/23 0534 10/17/23 0142  NA 125* 125* 125*  K 4.1 3.3* 3.5  CL 93* 92* 92*  CO2 19* 20* 22  GLUCOSE 102* 93 105*  BUN <5* 6 6  CREATININE 1.40* 1.20* 0.90  CALCIUM  9.0 9.2 9.0   LFT Recent Labs    10/17/23 0142  PROT 5.0*  ALBUMIN  3.0*  2.9*  AST 63*  ALT 17  ALKPHOS 39  BILITOT 10.1*  BILIDIR 5.4*  IBILI 4.7*   PT/INR Recent Labs    10/16/23 0534 10/17/23 0142  LABPROT 39.7* 36.4*  INR 3.9* 3.5*    Studies/Results: CT ABDOMEN PELVIS WO CONTRAST Result Date: 10/16/2023 CLINICAL DATA:  Appendicitis, severely decompensated cirrhosis EXAM: CT ABDOMEN AND PELVIS WITHOUT CONTRAST TECHNIQUE: Multidetector CT imaging of the abdomen and pelvis was performed following the standard protocol without IV contrast. RADIATION DOSE REDUCTION: This exam was performed according to the departmental dose-optimization program which includes automated exposure control, adjustment of the mA and/or kV according to patient size and/or use of iterative reconstruction technique. COMPARISON:  10/11/2023 FINDINGS: Lower chest: Small bilateral pleural effusions. Hepatobiliary: No solid liver abnormality  is seen. Coarse, nodular, cirrhotic morphology of the liver. No gallstones. Unchanged gallbladder wall thickening and pericholecystic fluid. No biliary dilatation. Pancreas: Unremarkable. No pancreatic ductal dilatation or surrounding inflammatory changes. Spleen: Normal in size without significant abnormality. Adrenals/Urinary Tract: Adrenal glands are unremarkable. Faint bilateral medullary nephrocalcinosis. No discrete calculi. No hydronephrosis. Bladder is decompressed by Foley catheter.  Stomach/Bowel: Stomach is within normal limits. Appendix more difficult to identify on this noncontrast examination although not evidently changed, measuring up to 1.1 cm in caliber (series 2, image 55). No evidence of bowel wall thickening, distention, or inflammatory changes. Vascular/Lymphatic: Aortic atherosclerosis. No enlarged abdominal or pelvic lymph nodes. Reproductive: No mass or other significant abnormality. Other: No abdominal wall hernia. Severe anasarca. Large volume ascites throughout the abdomen and pelvis, slightly diminished compared to prior examination and keeping with recent therapeutic paracentesis. Musculoskeletal: No acute or significant osseous findings. IMPRESSION: 1. Appendix more difficult to identify on this noncontrast examination although not evidently changed, measuring up to 1.1 cm in caliber. 2. Cirrhosis. 3. Large volume ascites throughout the abdomen and pelvis, slightly diminished compared to prior examination and keeping with recent therapeutic paracentesis. 4. Severe anasarca. 5. Small bilateral pleural effusions. 6. Unchanged gallbladder wall thickening and pericholecystic fluid, nonspecific in the setting of ascites. 7. Faint bilateral medullary nephrocalcinosis. No discrete calculi. No hydronephrosis. Aortic Atherosclerosis (ICD10-I70.0). Electronically Signed   By: Marolyn JONETTA Jaksch M.D.   On: 10/16/2023 14:33   IR Paracentesis Result Date: 10/15/2023 INDICATION: 53 year old female with alcoholic cirrhosis and ascites. Request for therapeutic and diagnostic paracentesis up to 4 L. EXAM: ULTRASOUND GUIDED  PARACENTESIS MEDICATIONS: 10 mL 1% lidocaine  COMPLICATIONS: None immediate. PROCEDURE: Informed written consent was obtained from the patient after a discussion of the risks, benefits and alternatives to treatment. A timeout was performed prior to the initiation of the procedure. Initial ultrasound scanning demonstrates a large amount of ascites within the right lower  abdominal quadrant. The right lower abdomen was prepped and draped in the usual sterile fashion. 1% lidocaine  was used for local anesthesia. Following this, a 5 [place] Cook centesis catheter was introduced. An ultrasound image was saved for documentation purposes. The paracentesis was performed. The catheter was removed and a dressing was applied. The patient tolerated the procedure well without immediate post procedural complication. FINDINGS: A total of approximately 4 L of hazy yellow fluid was removed. Samples were sent to the laboratory as requested by the clinical team. IMPRESSION: Successful ultrasound-guided paracentesis yielding 4 liters of peritoneal fluid. PLAN: If the patient eventually requires >/=2 paracenteses in a 30 day period, candidacy for formal evaluation by the [facility] Interventional Radiology Portal Hypertension Clinic will be assessed. Performed by: Aimee Han, PA-C. Electronically Signed   By: [name].  Shick M.D.   On: 10/15/2023 14:54       Assessment / Plan:    #36 53 year old white female with severely decompensated EtOH induced cirrhosis/current MELD-Na=35 (10/15/2023)  [facility]ially [facility] with acute GI bleed.  EGD at that time showed severe grade D esophagitis with concern for component of esophageal necrosis in the lower third of the esophagus, also noted severe portal gastropathy throughout the stomach, 1 nonbleeding cratered gastric ulcer and multiple superficial duodenal ulcers.  Continue twice daily PPI and Carafate  suspension between meals and bedtime. Will treat with Carafate  for 1 month, and plan to continue twice daily PPI long-term  She did complete 4 days of IV NAC due to concerns for worsening hepatic failure T. bili now stable in the 10  range.  Severe coagulopathy despite IV vitamin K  daily-INR finally showing mild improvement today  #2 acute kidney injury-much improved, has been on midodrine  15 3 times daily and every 6 hours albumin , diuretics  on hold  Per nephrology felt to be contrast nephropathy Continue Foley for now  #3 hyponatremia stable-secondary to severe decompensation of cirrhosis #4 anasarca/ascites-has been off diuretics-nephrology okay with IV Lasix today, will order 40 mg IV  #5 anemia for multifactoral-globin stable  #6 possible acute appendicitis, had been evaluated by surgery, not a surgical candidate.  She has been on IV antibiotics, repeat CT yesterday with persistent increased caliber of the appendix up to 1.1 cm no associated adjacent stranding or bowel wall thickening  #7 status post perforated gastric ulcer July 2025 status post omental patch repair #8 EtOH use disorder-currently abstinent x 1 month (2 weeks prior to admission)  Plan; continue lactulose but decrease to 20 g 3 times daily NAC has been discontinued Continue daily vitamin K  but will convert to oral 10 mg daily x 3 days IV Lasix today 40 mg Continue IV antibiotics  Repeat labs in a.m. including INR    Principal Problem:   GI bleed Active Problems:   Alcohol use disorder   Hyponatremia   Tobacco abuse   Nausea vomiting and diarrhea   Acute on chronic blood loss anemia   Liver cirrhosis (HCC)   Esophageal necrosis   Decompensated cirrhosis (HCC)   ABLA (acute blood loss anemia)   AKI (acute kidney injury)     LOS: 14 days   Amy Esterwood PA-C 10/17/2023, 12:13 PM   ATTENDING ADDENDUM AKI improving, renal function responding to therapy. Given a dose of lasix this AM. Her INR has come down slightly and bili as well. Having bowel movements with lactulose however has been getting narcotics for pain and is encephalopathic. Please avoid narcotics for this patient.  Repeat CT done for persistent pain, appendix appears to be same diameter but no complicating features. She is not an operative candidate per surgery, we are treating with antibiotics. She has had multiple paracentesis and no evidence for SBP.   Continuing protonix   and carafate  for history of esophagitis and PID.   Severely decompensated cirrhosis in the setting of recent illness - GI bleed and appendicitis, AKI. Have held off on empiric steroids (in case of component of alcoholic hepatitis, in setting of appendicitis).   Continue supportive measures for now. Continue vitamin K , trend INR and LAEs daily.  I personally saw the patient and performed a substantive portion of this encounter (>50% time spent), including MDM  Marcey Naval, MD [facility]

## 2023-10-17 NOTE — Hospital Course (Addendum)
 Joan Wood is a 53 y.o. female with PMH of history of decompensated liver cirrhosis, PUD, gastric ulcer perforation requiring exploratory laparotomy, GERD, pancreatitis, OSA admitted initially for coffee-ground emesis and bloody diarrhea.  Consulted GI underwent endoscopy which revealed nonbleeding ulcer and esophagitis and some concerns of esophageal necrosis.  This was treated by GI team and IR had also been consulted.  Hospital course also complicated by hepatic encephalopathy requiring lactulose  and rifaximin .  Intermittently that she requires paracentesis.  At this time waiting for safe disposition  Subjective: Seen and examined She has no new complaints resting comfortably reports she just took her meds Overnight vital stable afebrile on room abdomen remains similarly distended but no significant tenderness or worsening  Assessment and plan:   Decompensated alcoholic cirrhosis Hepatic encephalopathy Recurrent ascites Alcohol  abuse: Suspected etiology has alcohol  use no other clear etiology.  EF 70 to 75% on echo 10/10/2023.  Patient was seen by PCCM GI nephrology surgery this admission.  On paracentesis intermittently  prn 1-2 WEEKLY-no ascites on ultrasound on 02/11/24> recheck in a week Continue current meds w/ Lasix  40mg , aldactone  100 mg and lactulose , midodrine  10 tid, MVI,rifaximin ,thiamine  folate  Coffee-ground emesis with nausea vomiting abdominal pain and history of peptic ulcer disease with known recent gastric ulcer perforation: On admission patient had coffee-ground emesis in September underwent EGD-egd on sept 2025- LA grade D esophagitis without bleeding portal hypertensive gastropathy, nonbleeding gastric ulcer, black discoloration of mucosa in the esophagus concerning for acute esophageal necrosis AVM.  Continue PPI twice daily  Deconditioning Debility: Walking with staff as tolerated, patient per administration cannot walk by herself due to hospital policy.Continue pain  management on oxy prn.   Chronic hypotension: In the setting of liver cirrhosis HRS physiology.  Well-controlled on midodrine  tid   Hypokalemia Hypomagnesemia: Electrolytes stable. Cont Mag-Ox and k dur   Chronic hyponatremia: In the setting of cirrhosis, continue fluid restriction as ordered.Overall indication of poor prognosis   Cold sores: Improved with acyclovir    AKI: Likely from contrast, resolved. Monitor   Appendicitis: Much earlier in the admission.  Treated supportively with antibiotics.  Resolved.   Chronic pain: Continue oxy prn, along with gabapentin , Atarax  as needed and Seroquel  nightly    Severe malnutrition:  Augment nutritional status  Nutrition Problem: Severe Malnutrition Etiology: chronic illness (cirrhosis) Signs/Symptoms: severe fat depletion, severe muscle depletion Interventions: Ensure Enlive (each supplement provides 350kcal and 20 grams of protein), MVI, Liberalize Diet, Magic cup Current Body mass index is 22.62 kg/m.:  Mobility: PT Orders:  PT Follow up Rec: Recommend Snf, Barriers To Snf Placement - Toc To F/U With Patient/Family For D/C Plans (If Family/Friends Are Able To Coordinate 24/7 Support, Pt Would Be Able To Return Home With Hh Vs. Op Pt For Continued Balance Training)01/27/2024 1439   DVT prophylaxis: enoxaparin  (LOVENOX ) injection 30 mg Start: 02/07/24 1200 SCDs Start: 10/03/23 2153 Code Status:   Code Status: Full Code Family Communication: plan of care discussed with patient at bedside. Patient status is: Remains hospitalized because of severity of illness Level of care: Med-Surg   Dispo: The patient is from: home-reports she was renting a room at somebody's house and she cannot go back until she is able to walk by herself            Anticipated disposition: Waiting for safe disposition  Objective: Vitals last 24 hrs: Vitals:   02/14/24 0517 02/14/24 0735 02/15/24 0002 02/15/24 0824  BP:  116/63 112/69 (!) 97/59  Pulse:  97 99 91  Resp:  16 16 18   Temp:  98.5 F (36.9 C) 99 F (37.2 C) 99 F (37.2 C)  TempSrc:  Oral Oral Oral  SpO2:  97% 98% 100%  Weight: 56.1 kg     Height:       Physical Examination: General exam: alert awake oriented and pleasant HEENT:Oral mucosa moist, Ear/Nose WNL grossly. Respiratory system: Bilaterally clear Cardiovascular system: S1 & S2 +, No JVD. Gastrointestinal system: Abdomen soft-with mod distention without significant tenderness Nervous System: Alert, awake, moving all extremities,and following commands. Extremities: extremities warm, leg edema neg. Skin: Warm, no rashes. MSK: Normal muscle bulk,tone, power.   Medications reviewed:  Scheduled Meds:  sodium chloride    Intravenous Once   calcium  carbonate  1 tablet Oral TID WC   enoxaparin  (LOVENOX ) injection  30 mg Subcutaneous Q24H   folic acid   1 mg Oral Daily   furosemide   40 mg Oral Daily   gabapentin   600 mg Oral TID   lactulose   30 g Oral TID   lidocaine   2 patch Transdermal Q24H   lidocaine -EPINEPHrine   20 mL Intradermal Once   magnesium  oxide  400 mg Oral BID   midodrine   10 mg Oral TID WC   multivitamin with minerals  1 tablet Oral Daily   mupirocin  ointment  1 Application Topical BID   pantoprazole   40 mg Oral BID   potassium chloride   20 mEq Oral Daily   QUEtiapine   25 mg Oral QHS   rifaximin   550 mg Oral BID   spironolactone   100 mg Oral Daily   thiamine   100 mg Oral Daily   triamcinolone  0.1 % cream : eucerin   Topical Daily   Continuous Infusions:  albumin  human Stopped (02/13/24 0700)   Diet: Diet Order             Diet 2 gram sodium Room service appropriate? Yes with Assist; Fluid consistency: Thin; Fluid restriction: 1500 mL Fluid  Diet effective now

## 2023-10-17 NOTE — Progress Notes (Signed)
 Nutrition Follow-up  DOCUMENTATION CODES:  Severe malnutrition in context of chronic illness  INTERVENTION:  Continue current diet as ordered Encourage PO intake Continue ordering assistance Continue Ensure Plus High Protein po TID, each supplement provides 350 kcal and 20 grams of protein. MVI with minerals daily, thiamine  and folic acid  for alcohol use history Magic cup TID with meals, each supplement provides 290 kcal and 9 grams of protein  NUTRITION DIAGNOSIS:  Severe Malnutrition related to chronic illness (cirrhosis) as evidenced by severe fat depletion, severe muscle depletion. - new dx established  GOAL:  Patient will meet greater than or equal to 90% of their needs - progressing  MONITOR:  PO intake, Supplement acceptance, Labs, I & O's  REASON FOR ASSESSMENT:  Consult (low BMI) Assessment of nutrition requirement/status  ASSESSMENT:  Pt with hx of cirrhosis, perforated gastric ulcers in July s/p repair, recent GIB, h/o pancreatitis, alcohol use disorder, and GERD presented to [facility] ED with N/V/D and poor PO intake. Workup concerning for GI bleed  9/24 - presented to [facility] ED 9/25 - EGD showed necrotic appearing severe esophagitis in the mid and distal esophagus. Severe portal gastropathy. One 2 cm gastric ulcer and multiple superficial duodenal ulcers with clean bases, transferred to [facility] MICU, paracentesis removed 10/4 - diagnostic paracentesis of bright yellow fluid removed 10/6 - paracentesis 4L removed  Pt resting in bed at the time of assessment. About to eat her lunch. Pt tired today, states she had a rough night. Pt reports that appetite has flucuated but that she is eating more now than when she first arrived. Really enjoys the ensure supplements. Will increase to TID.   On exam, pt with virtually no muscle or fat stores. Severely malnourished. All weight is concentrated in her abdomen and is likely just fluid. States her weight  used to stay between 115-120 lbs and since she started having fluid accumulation is at around 140.   Noted that therapy team recommending SNF but pt refusing, wants to go home. Will continue to follow and promote interventions to help pt meet her nutrition needs.   Admit weight: 56.7 kg Current weight: 68.1 kg    Average Meal Intake: 9/27-9/29: 65% intake x 3 recorded meals 10/2-10/5: 22% intake x 3 recorded meals  Nutritionally Relevant Medications: Scheduled Meds:  bisacodyl   10 mg Rectal Once   calcium  carbonate  1 tablet Oral TID WC   Ensure Plus High Protein  237 mL Oral BID BM   folic acid   1 mg Oral Daily   lactulose  30 g Oral TID   metroNIDAZOLE   500 mg Oral Q12H   multivitamin with minerals  1 tablet Oral Daily   pantoprazole   40 mg Oral BID   sucralfate   1 g Oral Q6H   thiamine   100 mg Oral Daily   Continuous Infusions:  cefTRIAXone  (ROCEPHIN )  IV 2 g (10/17/23 0833)   phytonadione  (VITAMIN K ) 5 mg in dextrose  5 % 50 mL IVPB Stopped (10/16/23 1322)   PRN Meds: alum & mag hydroxide-simeth, diphenhydrAMINE , ondansetron , phenol, simethicone   Labs Reviewed: Sodium 125, chloride 92 CBG ranges from 87-120 mg/dL over the last 24 hours HgbA1c 4.5% (07/30/23)  NUTRITION - FOCUSED PHYSICAL EXAM: Defer to in-person assessment  Diet Order:   Diet Order             Diet 2 gram sodium Room service appropriate? Yes; Fluid consistency: Thin; Fluid restriction: 2000 mL Fluid  Diet effective now  EDUCATION NEEDS:  Education needs have been addressed  Skin:  Skin Assessment: Reviewed RN Assessment Stage 2: mid sacrum (2 x 0.6 x 0.94 cm)  Last BM:  10/7 - type 6  Height:  Ht Readings from Last 1 Encounters:  10/04/23 5' 2 (1.575 m)    Weight:  Wt Readings from Last 1 Encounters:  10/17/23 68.1 kg    Ideal Body Weight:  50 kg  BMI:  Body mass index is 27.46 kg/m.  Estimated Nutritional Needs:  Kcal:  1600-1800 kcal/d Protein:   85-100g/d Fluid:  1.5L/d    Vernell Lukes, RD, LDN, CNSC Registered Dietitian II Please reach out via secure chat

## 2023-10-17 NOTE — Progress Notes (Signed)
 Progress Note    Joan Wood   FMW:[id number]  DOB: [birth year]  DOA: 10/03/2023     14 PCP: Edman Meade PEDLAR, FNP  Initial CC: N/V/D  Hospital Course: Joan Wood is a 53 year old female with decompensated cirrhosis, PUD with recent gastric ulcer perforation s/p ex-lap, GERD, pancreatitis, OSA who presented to [facility] with N/V/bloody diarrhea x 4 days.    She had coffee ground emesis in the ED. CTA GI neg for acute bleed, large ascites and new distal esophageal thickening.    GI consulted for acute blood loss anemia with Hg 8.3>7. She was given PPI, started on octreotide  gtt, rocephin , vitamin K , pain and nausea control and admitted to hospitalist.    Now s/p upper endoscopy showing black discoloration of esophageal mucosa concerning for acute esophageal necrosis, portal hypertension, non-bleeding gastric and duodenal ulcers.    Transferred to [facility] ICU for further management.    Significant Events 9/24: ED for upper GI bleed admit to hospitalist  9/25: 1 U PRBC, GI upper endoscopy w/ acute esophageal necrosis with recommendation for transfer to [facility] > NPO, s/p paracentesis 3.35L drained Transferred to hospitalists service 9/27 GI signed off 9/30 10/1 US  without sufficient ascites to tap 10/3 CT with findings concerning for appendicitis, surgery recommending antibiotics (high risk for surgery).  GI reconsulted to to progressive liver failure.  10/4 diagnostic para without evidence SBP 10/5 diuretics held with doubling creatinine 10/6 para with 4 L off.  Renal consult for progressive AKI.   Assessment & Plan:   Decompensated Alcoholic Cirrhosis Hepatic Encephalopathy - improved Ascites  Bilateral Pleural Effusions  Hypoalbuminemia  Thrombocytopenia  Bili fluctuating/some downtrend, INR rising S/p 5 days ceftriaxone  (now on abx with appendicitis below) -Completed trial of NAC as well - s/p paracentesis as needed; last on 10/6, 4L out - Still remains somewhat volume overloaded  and undergoing treatment with Lasix as able   Acute Kidney Injury, presumed ATN 2/2 contrast - not an HD candidate - seen by nephrology  - Continue Foley for now but will perform TOV as able  Appendicitis Appreciate gen surgery, recommending abx -ok for diet she's high risk for surgery in setting of her cirrhosis -Repeat CT A/P performed 10/16/2023 showing unchanged appendix, 1.1 cm in caliber   Acute upper GI bleed Acute esophageal necrosis Acute blood loss anemia Patient has h/o PUD, gastric ulcer perforation s/p sx lap 07/2023. Now s/p EGD showing grade D esophagitis, black discoloration mucosa in esophagus concerning for acute esophageal necrosis, portal hypertensive gastropathy, non bleeding gastric ulcer with clean ulcer base with apparent sutures.  No bleeding duodenal ulcers with a clean ulcer base.  No evidence of active bleeding or varices.   Transferred to [facility] given findings concerning for acute esophageal necrosis Gastrin level wnl  GI recommending carafate  x14 days, BID PPI until follow up with GI primary at [facility].  Needs ensure 2-3 times daily.     Abdominal Pain Suspect related to abdominal distension from ascites +/- possible appendicitis  Repeat CT scan as noted    Orthostatic Hypotension S/p 1 unit pRBC midodrine  Follow orthostatics    Right Lower Extremity Edema Right Lower Extremity Pain LE US  - no evidence of deep vein thrombosis in lower extremity CT with moderate generalized subcutaneous edema throughout both visualized size, similar to recent pelvic CT (anasarca/third spacing vs cellulitis) She's on abx which would cover cellulitis, though lower suspicion for this Pain radiating all down leg, ? Neuropathic pain, Continue gabapentin   Hypervolemic Hyponatremia fluctuating Appreciate renal assistance with volume and hyponatremia   Hypomagnesemia  Hypophosphatemia  Hypokalemia Follow, replace as needed   Tobacco abuse Continue nicotine   patch.   Alcohol abuse Continue thiamine , folate, MVT.   Severe Protein Calorie Malnutrition Body mass index is 28.95 kg/m RD  Interval History:  No events overnight.  Still having significant amount of abdominal pain when seen this morning.   Old records reviewed in assessment of this patient  Antimicrobials:   DVT prophylaxis:  SCDs Start: 10/03/23 2153   Code Status:   Code Status: Full Code  Mobility Assessment (Last 72 Hours)     Mobility Assessment     Row Name 10/17/23 1200 10/17/23 0346 10/17/23 0012 10/16/23 2120 10/16/23 20:05:11   Does the patient have exclusion criteria? No - Perform mobility assessment No - Perform mobility assessment No - Perform mobility assessment No - Perform mobility assessment No - Perform mobility assessment   What is the highest level of mobility based on the mobility assessment? Level 2 (Chairfast) - Balance while sitting on edge of bed and cannot stand Level 2 (Chairfast) - Balance while sitting on edge of bed and cannot stand Level 2 (Chairfast) - Balance while sitting on edge of bed and cannot stand Level 2 (Chairfast) - Balance while sitting on edge of bed and cannot stand Level 2 (Chairfast) - Balance while sitting on edge of bed and cannot stand   Is the above level different from baseline mobility prior to current illness? Yes - Recommend PT order Yes - Recommend PT order Yes - Recommend PT order Yes - Recommend PT order Yes - Recommend PT order    Row Name 10/16/23 1238 10/16/23 1000 10/15/23 1949 10/15/23 1500 10/14/23 2008   Does the patient have exclusion criteria? -- No - Perform mobility assessment No - Perform mobility assessment No - Perform mobility assessment No - Perform mobility assessment   What is the highest level of mobility based on the mobility assessment? Level 2 (Chairfast) - Balance while sitting on edge of bed and cannot stand Level 3 (Stands with assistance) - Balance while standing  and cannot march in place  Level 3 (Stands with assistance) - Balance while standing  and cannot march in place Level 3 (Stands with assistance) - Balance while standing  and cannot march in place Level 3 (Stands with assistance) - Balance while standing  and cannot march in place   Is the above level different from baseline mobility prior to current illness? -- Yes - Recommend PT order Yes - Recommend PT order Yes - Recommend PT order Yes - Recommend PT order      Barriers to discharge:  Disposition Plan:  TBD HH orders placed: TBD Status is: Inpt  Objective: Blood pressure (!) 81/50, pulse 82, temperature 98 F (36.7 C), temperature source Oral, resp. rate 18, height 5' 2 (1.575 m), weight 68.1 kg, SpO2 99%.  Examination:  Physical Exam Constitutional:      Appearance: Normal appearance.  HENT:     Head: Normocephalic and atraumatic.     Mouth/Throat:     Mouth: Mucous membranes are moist.  Eyes:     Extraocular Movements: Extraocular movements intact.  Cardiovascular:     Rate and Rhythm: Normal rate and regular rhythm.  Pulmonary:     Effort: Pulmonary effort is normal. No respiratory distress.     Breath sounds: Normal breath sounds. No wheezing.  Abdominal:     General: Bowel sounds are normal.  There is distension.     Palpations: Abdomen is soft.     Tenderness: There is abdominal tenderness in the right upper quadrant, right lower quadrant, left upper quadrant and left lower quadrant.  Musculoskeletal:        General: Normal range of motion.     Cervical back: Normal range of motion and neck supple.  Skin:    General: Skin is warm and dry.  Neurological:     General: No focal deficit present.     Mental Status: She is alert.  Psychiatric:        Mood and Affect: Mood normal.      Consultants:  GI Nephrology, signed off 10/8  Procedures:    Data Reviewed: Results for orders placed or performed during the hospital encounter of 10/03/23 (from the past 24 hours)  Hemoglobin and  hematocrit, blood     Status: Abnormal   Collection Time: 10/16/23  4:13 PM  Result Value Ref Range   Hemoglobin 8.6 (L) 12.0 - 15.0 g/dL   HCT 75.2 (L) 63.9 - 53.9 %  Glucose, capillary     Status: Abnormal   Collection Time: 10/16/23  4:48 PM  Result Value Ref Range   Glucose-Capillary 120 (H) 70 - 99 mg/dL  Glucose, capillary     Status: Abnormal   Collection Time: 10/16/23  8:06 PM  Result Value Ref Range   Glucose-Capillary 118 (H) 70 - 99 mg/dL   Comment 1 Notify RN   Glucose, capillary     Status: Abnormal   Collection Time: 10/16/23 11:31 PM  Result Value Ref Range   Glucose-Capillary 113 (H) 70 - 99 mg/dL   Comment 1 Notify RN   Renal function panel     Status: Abnormal   Collection Time: 10/17/23  1:42 AM  Result Value Ref Range   Sodium 125 (L) 135 - 145 mmol/L   Potassium 3.5 3.5 - 5.1 mmol/L   Chloride 92 (L) 98 - 111 mmol/L   CO2 22 22 - 32 mmol/L   Glucose, Bld 105 (H) 70 - 99 mg/dL   BUN 6 6 - 20 mg/dL   Creatinine, Ser 9.09 0.44 - 1.00 mg/dL   Calcium  9.0 8.9 - 10.3 mg/dL   Phosphorus 2.6 2.5 - 4.6 mg/dL   Albumin  2.9 (L) 3.5 - 5.0 g/dL   GFR, Estimated >39 >39 mL/min   Anion gap 11 5 - 15  CBC     Status: Abnormal   Collection Time: 10/17/23  1:42 AM  Result Value Ref Range   WBC 8.7 4.0 - 10.5 K/uL   RBC 2.63 (L) 3.87 - 5.11 MIL/uL   Hemoglobin 8.6 (L) 12.0 - 15.0 g/dL   HCT 75.3 (L) 63.9 - 53.9 %   MCV 93.5 80.0 - 100.0 fL   MCH 32.7 26.0 - 34.0 pg   MCHC 35.0 30.0 - 36.0 g/dL   RDW 79.1 (H) 88.4 - 84.4 %   Platelets 82 (L) 150 - 400 K/uL   nRBC 0.0 0.0 - 0.2 %  Hepatic function panel     Status: Abnormal   Collection Time: 10/17/23  1:42 AM  Result Value Ref Range   Total Protein 5.0 (L) 6.5 - 8.1 g/dL   Albumin  3.0 (L) 3.5 - 5.0 g/dL   AST 63 (H) 15 - 41 U/L   ALT 17 0 - 44 U/L   Alkaline Phosphatase 39 38 - 126 U/L   Total Bilirubin 10.1 (H) 0.0 - 1.2 mg/dL  Bilirubin, Direct 5.4 (H) 0.0 - 0.2 mg/dL   Indirect Bilirubin 4.7 (H) 0.3 -  0.9 mg/dL  Protime-INR     Status: Abnormal   Collection Time: 10/17/23  1:42 AM  Result Value Ref Range   Prothrombin Time 36.4 (H) 11.4 - 15.2 seconds   INR 3.5 (H) 0.8 - 1.2  Glucose, capillary     Status: Abnormal   Collection Time: 10/17/23  3:15 AM  Result Value Ref Range   Glucose-Capillary 106 (H) 70 - 99 mg/dL   Comment 1 Notify RN   Glucose, capillary     Status: None   Collection Time: 10/17/23  9:03 AM  Result Value Ref Range   Glucose-Capillary 87 70 - 99 mg/dL   Comment 1 Notify RN   Glucose, capillary     Status: None   Collection Time: 10/17/23 11:23 AM  Result Value Ref Range   Glucose-Capillary 90 70 - 99 mg/dL   Comment 1 Notify RN     I have reviewed pertinent nursing notes, vitals, labs, and images as necessary. I have ordered labwork to follow up on as indicated.  I have reviewed the last notes from staff over past 24 hours. I have discussed patient's care plan and test results with nursing staff, CM/SW, and other staff as appropriate.  Time spent: Greater than 50% of the 55 minute visit was spent in counseling/coordination of care for the patient as laid out in the A&P.   LOS: 14 days   Alm Apo, MD [facility] 10/17/2023, 2:37 PM

## 2023-10-18 DIAGNOSIS — K3580 Unspecified acute appendicitis: Secondary | ICD-10-CM | POA: Diagnosis not present

## 2023-10-18 DIAGNOSIS — K922 Gastrointestinal hemorrhage, unspecified: Secondary | ICD-10-CM | POA: Diagnosis not present

## 2023-10-18 DIAGNOSIS — K7290 Hepatic failure, unspecified without coma: Secondary | ICD-10-CM | POA: Diagnosis not present

## 2023-10-18 DIAGNOSIS — N179 Acute kidney failure, unspecified: Secondary | ICD-10-CM | POA: Diagnosis not present

## 2023-10-18 LAB — CBC WITH DIFFERENTIAL/PLATELET
Abs Immature Granulocytes: 0.06 K/uL (ref 0.00–0.07)
Basophils Absolute: 0.1 K/uL (ref 0.0–0.1)
Basophils Relative: 1 %
Eosinophils Absolute: 0.1 K/uL (ref 0.0–0.5)
Eosinophils Relative: 1 %
HCT: 26.6 % — ABNORMAL LOW (ref 36.0–46.0)
Hemoglobin: 8.8 g/dL — ABNORMAL LOW (ref 12.0–15.0)
Immature Granulocytes: 1 %
Lymphocytes Relative: 13 %
Lymphs Abs: 1 K/uL (ref 0.7–4.0)
MCH: 32.5 pg (ref 26.0–34.0)
MCHC: 33.1 g/dL (ref 30.0–36.0)
MCV: 98.2 fL (ref 80.0–100.0)
Monocytes Absolute: 1.3 K/uL — ABNORMAL HIGH (ref 0.1–1.0)
Monocytes Relative: 17 %
Neutro Abs: 5.5 K/uL (ref 1.7–7.7)
Neutrophils Relative %: 67 %
Platelets: 75 K/uL — ABNORMAL LOW (ref 150–400)
RBC: 2.71 MIL/uL — ABNORMAL LOW (ref 3.87–5.11)
RDW: 21.6 % — ABNORMAL HIGH (ref 11.5–15.5)
WBC: 8 K/uL (ref 4.0–10.5)
nRBC: 0 % (ref 0.0–0.2)

## 2023-10-18 LAB — COMPREHENSIVE METABOLIC PANEL WITH GFR
ALT: 17 U/L (ref 0–44)
AST: 65 U/L — ABNORMAL HIGH (ref 15–41)
Albumin: 2.6 g/dL — ABNORMAL LOW (ref 3.5–5.0)
Alkaline Phosphatase: 38 U/L (ref 38–126)
Anion gap: 11 (ref 5–15)
BUN: 8 mg/dL (ref 6–20)
CO2: 23 mmol/L (ref 22–32)
Calcium: 9.3 mg/dL (ref 8.9–10.3)
Chloride: 94 mmol/L — ABNORMAL LOW (ref 98–111)
Creatinine, Ser: 1.11 mg/dL — ABNORMAL HIGH (ref 0.44–1.00)
GFR, Estimated: 59 mL/min — ABNORMAL LOW (ref >60)
Glucose, Bld: 113 mg/dL — ABNORMAL HIGH (ref 70–99)
Potassium: 3.4 mmol/L — ABNORMAL LOW (ref 3.5–5.1)
Sodium: 128 mmol/L — ABNORMAL LOW (ref 135–145)
Total Bilirubin: 9 mg/dL — ABNORMAL HIGH (ref 0.0–1.2)
Total Protein: 4.9 g/dL — ABNORMAL LOW (ref 6.5–8.1)

## 2023-10-18 LAB — GLUCOSE, CAPILLARY
Glucose-Capillary: 100 mg/dL — ABNORMAL HIGH (ref 70–99)
Glucose-Capillary: 101 mg/dL — ABNORMAL HIGH (ref 70–99)
Glucose-Capillary: 101 mg/dL — ABNORMAL HIGH (ref 70–99)
Glucose-Capillary: 102 mg/dL — ABNORMAL HIGH (ref 70–99)
Glucose-Capillary: 106 mg/dL — ABNORMAL HIGH (ref 70–99)
Glucose-Capillary: 98 mg/dL (ref 70–99)
Glucose-Capillary: 99 mg/dL (ref 70–99)

## 2023-10-18 LAB — MAGNESIUM: Magnesium: 1.9 mg/dL (ref 1.7–2.4)

## 2023-10-18 LAB — PROTIME-INR
INR: 3.3 — ABNORMAL HIGH (ref 0.8–1.2)
Prothrombin Time: 35.2 s — ABNORMAL HIGH (ref 11.4–15.2)

## 2023-10-18 LAB — AMMONIA: Ammonia: 75 umol/L — ABNORMAL HIGH (ref 9–35)

## 2023-10-18 MED ORDER — POTASSIUM CHLORIDE CRYS ER 20 MEQ PO TBCR
40.0000 meq | EXTENDED_RELEASE_TABLET | Freq: Once | ORAL | Status: AC
  Administered 2023-10-18: 40 meq via ORAL
  Filled 2023-10-18: qty 2

## 2023-10-18 MED ORDER — MIDODRINE HCL 5 MG PO TABS
5.0000 mg | ORAL_TABLET | Freq: Once | ORAL | Status: AC
  Administered 2023-10-18: 5 mg via ORAL

## 2023-10-18 MED ORDER — SODIUM CHLORIDE 0.9 % IV SOLN: INTRAVENOUS | Status: DC

## 2023-10-18 MED ORDER — LACTULOSE 10 GM/15ML PO SOLN
30.0000 g | Freq: Three times a day (TID) | ORAL | Status: DC
  Administered 2023-10-18 – 2023-11-10 (×64): 30 g via ORAL
  Filled 2023-10-18 (×65): qty 60

## 2023-10-18 MED ORDER — PROCHLORPERAZINE EDISYLATE 10 MG/2ML IJ SOLN
10.0000 mg | Freq: Four times a day (QID) | INTRAMUSCULAR | Status: AC | PRN
  Administered 2023-10-18 – 2024-02-08 (×21): 10 mg via INTRAVENOUS
  Filled 2023-10-18 (×24): qty 2

## 2023-10-18 MED ORDER — ONDANSETRON HCL 4 MG/2ML IJ SOLN
4.0000 mg | Freq: Three times a day (TID) | INTRAMUSCULAR | Status: DC | PRN
  Administered 2023-10-20 – 2023-11-05 (×8): 4 mg via INTRAVENOUS
  Filled 2023-10-18 (×8): qty 2

## 2023-10-18 MED ORDER — ALBUMIN HUMAN 25 % IV SOLN
25.0000 g | Freq: Four times a day (QID) | INTRAVENOUS | Status: AC
  Administered 2023-10-18 – 2023-10-20 (×8): 25 g via INTRAVENOUS
  Filled 2023-10-18 (×8): qty 100

## 2023-10-18 NOTE — Progress Notes (Signed)
 Progress Note    Joan Wood   FMW:[id number]  DOB: 1970/05/30  DOA: 10/03/2023     15 PCP: Edman Meade PEDLAR, FNP  Initial CC: N/V/D  Hospital Course: Joan Wood is a 53 year old female with decompensated cirrhosis, PUD with recent gastric ulcer perforation s/p ex-lap, GERD, pancreatitis, OSA who presented to [facility] with N/V/bloody diarrhea x 4 days.    She had coffee ground emesis in the ED. CTA GI neg for acute bleed, large ascites and new distal esophageal thickening.    GI consulted for acute blood loss anemia with Hg 8.3>7. She was given PPI, started on octreotide  gtt, rocephin , vitamin K , pain and nausea control and admitted to hospitalist.    Now s/p upper endoscopy showing black discoloration of esophageal mucosa concerning for acute esophageal necrosis, portal hypertension, non-bleeding gastric and duodenal ulcers.    Transferred to [facility] ICU for further management.    Significant Events 9/24: ED for upper GI bleed admit to hospitalist  9/25: 1 U PRBC, GI upper endoscopy w/ acute esophageal necrosis with recommendation for transfer to [facility] > NPO, s/p paracentesis 3.35L drained Transferred to hospitalists service 9/27 GI signed off 9/30 10/1 US  without sufficient ascites to tap 10/3 CT with findings concerning for appendicitis, surgery recommending antibiotics (high risk for surgery).  GI reconsulted to to progressive liver failure.  10/4 diagnostic para without evidence SBP 10/5 diuretics held with doubling creatinine 10/6 para with 4 L off.  Renal consult for progressive AKI.   Assessment & Plan:   Decompensated Alcoholic Cirrhosis Hepatic Encephalopathy - improved Ascites  Bilateral Pleural Effusions  Hypoalbuminemia  Thrombocytopenia  Bili fluctuating/some downtrend, INR rose, now possibly stabilizing s/p Vit K doses - S/p 5 days ceftriaxone  (now on abx with appendicitis below) -Completed trial of NAC as well - s/p paracentesis as needed; last on 10/6, 4L out -  Still remains somewhat volume overloaded and undergoing treatment with Lasix as able - Slightly more confusion today; ammonia checked and more elevated compared to prior level, now 75; increase lactulose dosing -Repeat ammonia in a.m.   Acute Kidney Injury, presumed ATN 2/2 contrast - not an HD candidate - seen by nephrology; signed off - Continue Foley for now but will perform TOV as able  Appendicitis - Appreciate gen surgery, she's high risk for surgery in setting of her cirrhosis - Continue Rocephin  and Flagyl  - Repeat CT A/P performed 10/16/2023 showing unchanged appendix, 1.1 cm in caliber   Acute upper GI bleed Acute esophageal necrosis Acute blood loss anemia Patient has h/o PUD, gastric ulcer perforation s/p sx lap 07/2023. Now s/p EGD showing grade D esophagitis, black discoloration mucosa in esophagus concerning for acute esophageal necrosis, portal hypertensive gastropathy, non bleeding gastric ulcer with clean ulcer base with apparent sutures.  No bleeding duodenal ulcers with a clean ulcer base.  No evidence of active bleeding or varices.   Transferred to [facility] given findings concerning for acute esophageal necrosis Gastrin level wnl  GI recommending carafate  x14 days, BID PPI until follow up with GI primary at [facility].  Needs ensure 2-3 times daily.     Abdominal Pain Suspect related to abdominal distension from ascites +/- possible appendicitis  Repeat CT scan as noted    Orthostatic Hypotension S/p 1 unit pRBC midodrine  Follow orthostatics    Right Lower Extremity Edema Right Lower Extremity Pain LE US  - no evidence of deep vein thrombosis in lower extremity CT with moderate generalized subcutaneous edema throughout both visualized  size, similar to recent pelvic CT (anasarca/third spacing vs cellulitis) She's on abx which would cover cellulitis, though lower suspicion for this Pain radiating all down leg, ? Neuropathic pain, Continue gabapentin      Hypervolemic Hyponatremia fluctuating Appreciate renal assistance with volume and hyponatremia   Hypomagnesemia  Hypophosphatemia  Hypokalemia Follow, replace as needed   Tobacco abuse Continue nicotine  patch.   Alcohol abuse Continue thiamine , folate, MVT.   Severe Protein Calorie Malnutrition Body mass index is 28.95 kg/m RD  Interval History:  Some improvement in labs this morning.  She is awake, alert, and talking although still has slightly more confusion compared to yesterday or at least slowed mentation.  Forgot what she was talking about with me multiple times. Ammonia level checked and came back higher.  Is having bowel movements but unable to describe frequency.  Only 1 stool reported/charted yesterday. Has eaten some food but still having significant abdominal pain but does not seem to be in distress from it.   Old records reviewed in assessment of this patient  Antimicrobials:   DVT prophylaxis:  SCDs Start: 10/03/23 2153   Code Status:   Code Status: Full Code  Mobility Assessment (Last 72 Hours)     Mobility Assessment     Row Name 10/18/23 1137 10/18/23 08:18:42 10/17/23 2002 10/17/23 1200 10/17/23 0346   Does the patient have exclusion criteria? -- Yes- Hold (Level 0) - Assessment complete No - Perform mobility assessment No - Perform mobility assessment No - Perform mobility assessment   What is the highest level of mobility based on the mobility assessment? Level 1 (Bedfast) - Unable to balance while sitting on edge of bed Level 2 (Chairfast) - Balance while sitting on edge of bed and cannot stand Level 2 (Chairfast) - Balance while sitting on edge of bed and cannot stand Level 2 (Chairfast) - Balance while sitting on edge of bed and cannot stand Level 2 (Chairfast) - Balance while sitting on edge of bed and cannot stand   Is the above level different from baseline mobility prior to current illness? -- Yes - Recommend PT order Yes - Recommend PT order  Yes - Recommend PT order Yes - Recommend PT order    Row Name 10/17/23 0012 10/16/23 2120 10/16/23 20:05:11 10/16/23 1238 10/16/23 1000   Does the patient have exclusion criteria? No - Perform mobility assessment No - Perform mobility assessment No - Perform mobility assessment -- No - Perform mobility assessment   What is the highest level of mobility based on the mobility assessment? Level 2 (Chairfast) - Balance while sitting on edge of bed and cannot stand Level 2 (Chairfast) - Balance while sitting on edge of bed and cannot stand Level 2 (Chairfast) - Balance while sitting on edge of bed and cannot stand Level 2 (Chairfast) - Balance while sitting on edge of bed and cannot stand Level 3 (Stands with assistance) - Balance while standing  and cannot march in place   Is the above level different from baseline mobility prior to current illness? Yes - Recommend PT order Yes - Recommend PT order Yes - Recommend PT order -- Yes - Recommend PT order    Row Name 10/15/23 1949           Does the patient have exclusion criteria? No - Perform mobility assessment       What is the highest level of mobility based on the mobility assessment? Level 3 (Stands with assistance) - Balance while standing  and  cannot march in place       Is the above level different from baseline mobility prior to current illness? Yes - Recommend PT order          Barriers to discharge:  Disposition Plan:  TBD HH orders placed: TBD Status is: Inpt  Objective: Blood pressure (!) 78/51, pulse 81, temperature 98.1 F (36.7 C), temperature source Oral, resp. rate 16, height 5' 2 (1.575 m), weight 68.1 kg, SpO2 100%.  Examination:  Physical Exam Constitutional:      Appearance: Normal appearance.  HENT:     Head: Normocephalic and atraumatic.     Mouth/Throat:     Mouth: Mucous membranes are moist.  Eyes:     Extraocular Movements: Extraocular movements intact.  Cardiovascular:     Rate and Rhythm: Normal rate and  regular rhythm.  Pulmonary:     Effort: Pulmonary effort is normal. No respiratory distress.     Breath sounds: Normal breath sounds. No wheezing.  Abdominal:     General: Bowel sounds are normal. There is distension.     Palpations: Abdomen is soft.     Tenderness: There is abdominal tenderness in the right upper quadrant, right lower quadrant, left upper quadrant and left lower quadrant.  Musculoskeletal:        General: Normal range of motion.     Cervical back: Normal range of motion and neck supple.  Skin:    General: Skin is warm and dry.  Neurological:     General: No focal deficit present.     Mental Status: She is alert.  Psychiatric:        Mood and Affect: Mood normal.      Consultants:  GI, signed off 10/9 Nephrology, signed off 10/8  Procedures:    Data Reviewed: Results for orders placed or performed during the hospital encounter of 10/03/23 (from the past 24 hours)  Glucose, capillary     Status: Abnormal   Collection Time: 10/17/23  4:44 PM  Result Value Ref Range   Glucose-Capillary 123 (H) 70 - 99 mg/dL   Comment 1 Notify RN   Glucose, capillary     Status: Abnormal   Collection Time: 10/17/23  8:09 PM  Result Value Ref Range   Glucose-Capillary 131 (H) 70 - 99 mg/dL  Glucose, capillary     Status: Abnormal   Collection Time: 10/18/23 12:11 AM  Result Value Ref Range   Glucose-Capillary 101 (H) 70 - 99 mg/dL  CBC with Differential/Platelet     Status: Abnormal   Collection Time: 10/18/23  1:40 AM  Result Value Ref Range   WBC 8.0 4.0 - 10.5 K/uL   RBC 2.71 (L) 3.87 - 5.11 MIL/uL   Hemoglobin 8.8 (L) 12.0 - 15.0 g/dL   HCT 73.3 (L) 63.9 - 53.9 %   MCV 98.2 80.0 - 100.0 fL   MCH 32.5 26.0 - 34.0 pg   MCHC 33.1 30.0 - 36.0 g/dL   RDW 78.3 (H) 88.4 - 84.4 %   Platelets 75 (L) 150 - 400 K/uL   nRBC 0.0 0.0 - 0.2 %   Neutrophils Relative % 67 %   Neutro Abs 5.5 1.7 - 7.7 K/uL   Lymphocytes Relative 13 %   Lymphs Abs 1.0 0.7 - 4.0 K/uL    Monocytes Relative 17 %   Monocytes Absolute 1.3 (H) 0.1 - 1.0 K/uL   Eosinophils Relative 1 %   Eosinophils Absolute 0.1 0.0 - 0.5 K/uL   Basophils Relative  1 %   Basophils Absolute 0.1 0.0 - 0.1 K/uL   Immature Granulocytes 1 %   Abs Immature Granulocytes 0.06 0.00 - 0.07 K/uL  Comprehensive metabolic panel     Status: Abnormal   Collection Time: 10/18/23  1:40 AM  Result Value Ref Range   Sodium 128 (L) 135 - 145 mmol/L   Potassium 3.4 (L) 3.5 - 5.1 mmol/L   Chloride 94 (L) 98 - 111 mmol/L   CO2 23 22 - 32 mmol/L   Glucose, Bld 113 (H) 70 - 99 mg/dL   BUN 8 6 - 20 mg/dL   Creatinine, Ser 8.88 (H) 0.44 - 1.00 mg/dL   Calcium  9.3 8.9 - 10.3 mg/dL   Total Protein 4.9 (L) 6.5 - 8.1 g/dL   Albumin  2.6 (L) 3.5 - 5.0 g/dL   AST 65 (H) 15 - 41 U/L   ALT 17 0 - 44 U/L   Alkaline Phosphatase 38 38 - 126 U/L   Total Bilirubin 9.0 (H) 0.0 - 1.2 mg/dL   GFR, Estimated 59 (L) >60 mL/min   Anion gap 11 5 - 15  Protime-INR     Status: Abnormal   Collection Time: 10/18/23  1:40 AM  Result Value Ref Range   Prothrombin Time 35.2 (H) 11.4 - 15.2 seconds   INR 3.3 (H) 0.8 - 1.2  Magnesium      Status: None   Collection Time: 10/18/23  1:40 AM  Result Value Ref Range   Magnesium  1.9 1.7 - 2.4 mg/dL  Glucose, capillary     Status: Abnormal   Collection Time: 10/18/23  4:42 AM  Result Value Ref Range   Glucose-Capillary 101 (H) 70 - 99 mg/dL  Glucose, capillary     Status: Abnormal   Collection Time: 10/18/23  8:18 AM  Result Value Ref Range   Glucose-Capillary 102 (H) 70 - 99 mg/dL  Glucose, capillary     Status: None   Collection Time: 10/18/23 11:28 AM  Result Value Ref Range   Glucose-Capillary 99 70 - 99 mg/dL  Ammonia     Status: Abnormal   Collection Time: 10/18/23 12:57 PM  Result Value Ref Range   Ammonia 75 (H) 9 - 35 umol/L    I have reviewed pertinent nursing notes, vitals, labs, and images as necessary. I have ordered labwork to follow up on as indicated.  I have  reviewed the last notes from staff over past 24 hours. I have discussed patient's care plan and test results with nursing staff, CM/SW, and other staff as appropriate.  Time spent: Greater than 50% of the 55 minute visit was spent in counseling/coordination of care for the patient as laid out in the A&P.   LOS: 15 days   Alm Apo, MD [facility] 10/18/2023, 3:28 PM

## 2023-10-18 NOTE — Plan of Care (Signed)

## 2023-10-18 NOTE — Progress Notes (Addendum)
 Progress Note   Subjective  Patient is about the same - continues to have generalized pain / tenderness. She is alert and answering questions. Tolerating PO. Labs slightly improved.    Objective   Vital signs in last 24 hours: Temp:  [98 F (36.7 C)-99.5 F (37.5 C)] 98.2 F (36.8 C) (10/09 0818) Pulse Rate:  [80-97] 80 (10/09 0818) Resp:  [17-19] 19 (10/09 0818) BP: (81-91)/(45-54) 81/54 (10/09 0818) SpO2:  [97 %-100 %] 99 % (10/09 0818) Last BM Date : 10/17/23 General:    white female in NAD, jaundiced Abdomen:  Soft, diffusely tender , ascites.   Intake/Output from previous day: 10/08 0701 - 10/09 0700 In: -  Out: 150 [Urine:150] Intake/Output this shift: No intake/output data recorded.  Lab Results: Recent Labs    10/16/23 0534 10/16/23 1613 10/17/23 0142 10/18/23 0140  WBC 6.8  --  8.7 8.0  HGB 7.8* 8.6* 8.6* 8.8*  HCT 22.7* 24.7* 24.6* 26.6*  PLT 70*  --  82* 75*   BMET Recent Labs    10/16/23 0534 10/17/23 0142 10/18/23 0140  NA 125* 125* 128*  K 3.3* 3.5 3.4*  CL 92* 92* 94*  CO2 20* 22 23  GLUCOSE 93 105* 113*  BUN 6 6 8   CREATININE 1.20* 0.90 1.11*  CALCIUM  9.2 9.0 9.3   LFT Recent Labs    10/17/23 0142 10/18/23 0140  PROT 5.0* 4.9*  ALBUMIN  3.0*  2.9* 2.6*  AST 63* 65*  ALT 17 17  ALKPHOS 39 38  BILITOT 10.1* 9.0*  BILIDIR 5.4*  --   IBILI 4.7*  --    PT/INR Recent Labs    10/17/23 0142 10/18/23 0140  LABPROT 36.4* 35.2*  INR 3.5* 3.3*    Studies/Results: CT ABDOMEN PELVIS WO CONTRAST Result Date: 10/16/2023 CLINICAL DATA:  Appendicitis, severely decompensated cirrhosis EXAM: CT ABDOMEN AND PELVIS WITHOUT CONTRAST TECHNIQUE: Multidetector CT imaging of the abdomen and pelvis was performed following the standard protocol without IV contrast. RADIATION DOSE REDUCTION: This exam was performed according to the departmental dose-optimization program which includes automated exposure control, adjustment of the mA and/or kV  according to patient size and/or use of iterative reconstruction technique. COMPARISON:  10/11/2023 FINDINGS: Lower chest: Small bilateral pleural effusions. Hepatobiliary: No solid liver abnormality is seen. Coarse, nodular, cirrhotic morphology of the liver. No gallstones. Unchanged gallbladder wall thickening and pericholecystic fluid. No biliary dilatation. Pancreas: Unremarkable. No pancreatic ductal dilatation or surrounding inflammatory changes. Spleen: Normal in size without significant abnormality. Adrenals/Urinary Tract: Adrenal glands are unremarkable. Faint bilateral medullary nephrocalcinosis. No discrete calculi. No hydronephrosis. Bladder is decompressed by Foley catheter. Stomach/Bowel: Stomach is within normal limits. Appendix more difficult to identify on this noncontrast examination although not evidently changed, measuring up to 1.1 cm in caliber (series 2, image 55). No evidence of bowel wall thickening, distention, or inflammatory changes. Vascular/Lymphatic: Aortic atherosclerosis. No enlarged abdominal or pelvic lymph nodes. Reproductive: No mass or other significant abnormality. Other: No abdominal wall hernia. Severe anasarca. Large volume ascites throughout the abdomen and pelvis, slightly diminished compared to prior examination and keeping with recent therapeutic paracentesis. Musculoskeletal: No acute or significant osseous findings. IMPRESSION: 1. Appendix more difficult to identify on this noncontrast examination although not evidently changed, measuring up to 1.1 cm in caliber. 2. Cirrhosis. 3. Large volume ascites throughout the abdomen and pelvis, slightly diminished compared to prior examination and keeping with recent therapeutic paracentesis. 4. Severe anasarca. 5. Small bilateral pleural effusions. 6. Unchanged  gallbladder wall thickening and pericholecystic fluid, nonspecific in the setting of ascites. 7. Faint bilateral medullary nephrocalcinosis. No discrete calculi. No  hydronephrosis. Aortic Atherosclerosis (ICD10-I70.0). Electronically Signed   By: Marolyn JONETTA Jaksch M.D.   On: 10/16/2023 14:33       Assessment / Plan:    53 y/o female here with the following:  Decompensated cirrhosis AKI - contrast nephropathy - improving Possible appendicitis GI bleed - severe esophagitis and gastric / duodenal ulcers  AKI is improving, she is making urine.  Got a dose of Lasix for her ascites yesterday, need to watch renal function carefully and would dose Lasix as needed and adjust based on renal function.  Again she has not had alcohol for several weeks, I think alcoholic hepatitis is less likely here.  Further, she is not a good candidate for empiric steroids with possible appendicitis on antibiotics.  She had been on NAC for this empirically, her bilirubin seems to be downtrending slowly and INR appears to be slowly improving as well.  This is obviously good news. She has completed 5 days of NAC and got stopped yesterday. She probably has just worsening of her decompensated cirrhosis in the setting of multifactorial illness - GI bleed, appendicitis, AKI.   Continue supportive measures for now.  Would avoid narcotics if at all possible given her encephalopathy.  Continue lactulose for hepatic encephalopathy.   Her abdominal pain is her main complaint at this point in time.  Again it is unclear if she has appendicitis or not but she is a very poor operative candidate given her ascites and decompensated liver disease.  Continuing empiric antibiotics for now.  Her peptic ulcer disease could also be a component of her pain, however she is tolerating p.o.  Continue twice daily PPI and Carafate .  She has been tested for SBP multiple times and negative. Her liver imaging has shown no biliary ductal dilation or evidence of obstruction.  I do not have much else to add new at this point from GI perspective, anticipate prolonged hospitalization as she recovers from these issues,  continue supportive measures.  Standing by to assist as new issues arise, otherwise we will sign off for now.  Marcey Naval, MD [facility]

## 2023-10-18 NOTE — Progress Notes (Signed)
 Physical Therapy Treatment Patient Details Name: Joan Wood MRN: [id number] DOB: 1970/09/06 Today's Date: 10/18/2023   History of Present Illness 53 y.o. female presents to [facility] hospital on 10/03/2023 with nausea/vomiting and bloody diarrhea. Pt underwent upper EGD on 9/25, concerning for possible acute esophageal necrosis. Pt also underwent paracentesis on 9/25 and 10/6. PMH includes decompensated cirrhosis, PUD, GERD, pancreatitis, OSA.   PT Comments  Limited PT session due to low BP with pt reporting dizziness/lightheadedness. Performed a few exercises in supine in attempt to raise BP with readings below. Deferred further bed mobility or transfer due to low MAP. Repositioned bed into chair position with discussion on trying to maintain upright posture during the day. Initiated conversation about how pt would not be safe at home with current recommendation for <3hrs post acute rehab. Pt may benefit from Ted hose and/or abdominal brace to address low BP, discussion with RN. Acute PT to follow.   Supine BP readings every 3 min: 78/42, 72 BPM 77/45, 80 BPM 77/49, 83 BPM   If plan is discharge home, recommend the following: A lot of help with walking and/or transfers;A lot of help with bathing/dressing/bathroom;Assistance with cooking/housework;Assist for transportation;Help with stairs or ramp for entrance   Can travel by private vehicle     No  Equipment Recommendations  Hospital bed;BSC/3in1       Precautions / Restrictions Precautions Precautions: Fall Recall of Precautions/Restrictions: Impaired Precaution/Restrictions Comments: orthostatic Restrictions Weight Bearing Restrictions Per Provider Order: No     Mobility  Bed Mobility  General bed mobility comments: deferred 2/2 low BP    Transfers  General transfer comment: deferred 2/2 low BP         Communication Communication Communication: No apparent difficulties  Cognition Arousal: Alert Behavior During Therapy:  Anxious   PT - Cognitive impairments: No family/caregiver present to determine baseline, Awareness, Memory, Attention, Sequencing, Problem solving, Safety/Judgement    PT - Cognition Comments: Frequent re-direction with pt having difficulty understanding commands. Intermittently confused Following commands: Impaired Following commands impaired: Follows one step commands with increased time, Follows one step commands inconsistently    Cueing Cueing Techniques: Verbal cues, Visual cues, Tactile cues  Exercises General Exercises - Upper Extremity Shoulder Flexion: AROM, Both, 10 reps, Supine General Exercises - Lower Extremity Ankle Circles/Pumps: AROM, Both, 10 reps, Supine Straight Leg Raises: AAROM, Both, 10 reps, Supine        Pertinent Vitals/Pain Pain Assessment Pain Assessment: No/denies pain     PT Goals (current goals can now be found in the care plan section) Acute Rehab PT Goals PT Goal Formulation: With patient Time For Goal Achievement: 10/21/23 Potential to Achieve Goals: Fair Progress towards PT goals: Not progressing toward goals - comment (due to low BP)    Frequency    Min 2X/week       AM-PAC PT 6 Clicks Mobility   Outcome Measure  Help needed turning from your back to your side while in a flat bed without using bedrails?: A Little Help needed moving from lying on your back to sitting on the side of a flat bed without using bedrails?: A Lot Help needed moving to and from a bed to a chair (including a wheelchair)?: A Lot Help needed standing up from a chair using your arms (e.g., wheelchair or bedside chair)?: A Lot Help needed to walk in hospital room?: Total Help needed climbing 3-5 steps with a railing? : Total 6 Click Score: 11    End of Session  Activity Tolerance: Other (comment) (limited by low BP) Patient left: in bed;with call bell/phone within reach;with bed alarm set Nurse Communication: Mobility status;Other (comment) (BP  readings) PT Visit Diagnosis: Other abnormalities of gait and mobility (R26.89);Muscle weakness (generalized) (M62.81)     Time: 8893-8873 PT Time Calculation (min) (ACUTE ONLY): 20 min  Charges:    $Therapeutic Exercise: 8-22 mins PT General Charges $$ ACUTE PT VISIT: 1 Visit                    Kate ORN, PT, DPT Secure Chat Preferred  Rehab Office [phone]   Joan Wood 10/18/2023, 11:40 AM

## 2023-10-18 NOTE — Plan of Care (Signed)

## 2023-10-18 NOTE — Progress Notes (Signed)
 Notified MD of pt's BP of 79/47 w/MAP of 58, HR 80, RR 19, O2 100%  Pt c/o abd pain and dizziness when PT stood her up.   Pt weeping fluid from abd; changed dressing and linens.   MD ordered Albumin  and US  Paracentesis for 10/19/23  Pt is A&O x4.... No acute distress. Will continue to monitor pt.

## 2023-10-19 ENCOUNTER — Inpatient Hospital Stay (HOSPITAL_COMMUNITY)

## 2023-10-19 DIAGNOSIS — K922 Gastrointestinal hemorrhage, unspecified: Secondary | ICD-10-CM | POA: Diagnosis not present

## 2023-10-19 DIAGNOSIS — D62 Acute posthemorrhagic anemia: Secondary | ICD-10-CM | POA: Diagnosis not present

## 2023-10-19 DIAGNOSIS — Z7189 Other specified counseling: Secondary | ICD-10-CM

## 2023-10-19 DIAGNOSIS — K7031 Alcoholic cirrhosis of liver with ascites: Secondary | ICD-10-CM | POA: Diagnosis not present

## 2023-10-19 DIAGNOSIS — Z515 Encounter for palliative care: Secondary | ICD-10-CM | POA: Diagnosis not present

## 2023-10-19 DIAGNOSIS — K3580 Unspecified acute appendicitis: Secondary | ICD-10-CM | POA: Diagnosis not present

## 2023-10-19 DIAGNOSIS — R112 Nausea with vomiting, unspecified: Secondary | ICD-10-CM | POA: Diagnosis not present

## 2023-10-19 DIAGNOSIS — K7290 Hepatic failure, unspecified without coma: Secondary | ICD-10-CM | POA: Diagnosis not present

## 2023-10-19 LAB — COMPREHENSIVE METABOLIC PANEL WITH GFR
ALT: 16 U/L (ref 0–44)
AST: 60 U/L — ABNORMAL HIGH (ref 15–41)
Albumin: 3 g/dL — ABNORMAL LOW (ref 3.5–5.0)
Alkaline Phosphatase: 44 U/L (ref 38–126)
Anion gap: 11 (ref 5–15)
BUN: 12 mg/dL (ref 6–20)
CO2: 22 mmol/L (ref 22–32)
Calcium: 9.4 mg/dL (ref 8.9–10.3)
Chloride: 95 mmol/L — ABNORMAL LOW (ref 98–111)
Creatinine, Ser: 1.72 mg/dL — ABNORMAL HIGH (ref 0.44–1.00)
GFR, Estimated: 35 mL/min — ABNORMAL LOW (ref >60)
Glucose, Bld: 112 mg/dL — ABNORMAL HIGH (ref 70–99)
Potassium: 3.5 mmol/L (ref 3.5–5.1)
Sodium: 128 mmol/L — ABNORMAL LOW (ref 135–145)
Total Bilirubin: 7.7 mg/dL — ABNORMAL HIGH (ref 0.0–1.2)
Total Protein: 5.3 g/dL — ABNORMAL LOW (ref 6.5–8.1)

## 2023-10-19 LAB — GLUCOSE, CAPILLARY
Glucose-Capillary: 103 mg/dL — ABNORMAL HIGH (ref 70–99)
Glucose-Capillary: 109 mg/dL — ABNORMAL HIGH (ref 70–99)
Glucose-Capillary: 109 mg/dL — ABNORMAL HIGH (ref 70–99)
Glucose-Capillary: 79 mg/dL (ref 70–99)
Glucose-Capillary: 85 mg/dL (ref 70–99)
Glucose-Capillary: 96 mg/dL (ref 70–99)

## 2023-10-19 LAB — CBC WITH DIFFERENTIAL/PLATELET
Abs Immature Granulocytes: 0.1 K/uL — ABNORMAL HIGH (ref 0.00–0.07)
Basophils Absolute: 0 K/uL (ref 0.0–0.1)
Basophils Relative: 1 %
Eosinophils Absolute: 0.1 K/uL (ref 0.0–0.5)
Eosinophils Relative: 1 %
HCT: 22.7 % — ABNORMAL LOW (ref 36.0–46.0)
Hemoglobin: 7.7 g/dL — ABNORMAL LOW (ref 12.0–15.0)
Immature Granulocytes: 1 %
Lymphocytes Relative: 11 %
Lymphs Abs: 0.9 K/uL (ref 0.7–4.0)
MCH: 32.5 pg (ref 26.0–34.0)
MCHC: 33.9 g/dL (ref 30.0–36.0)
MCV: 95.8 fL (ref 80.0–100.0)
Monocytes Absolute: 1.6 K/uL — ABNORMAL HIGH (ref 0.1–1.0)
Monocytes Relative: 18 %
Neutro Abs: 5.8 K/uL (ref 1.7–7.7)
Neutrophils Relative %: 68 %
Platelets: 89 K/uL — ABNORMAL LOW (ref 150–400)
RBC: 2.37 MIL/uL — ABNORMAL LOW (ref 3.87–5.11)
RDW: 21.3 % — ABNORMAL HIGH (ref 11.5–15.5)
Smear Review: NORMAL
WBC: 8.5 K/uL (ref 4.0–10.5)
nRBC: 0 % (ref 0.0–0.2)

## 2023-10-19 LAB — BASIC METABOLIC PANEL WITH GFR
Anion gap: 12 (ref 5–15)
BUN: 12 mg/dL (ref 6–20)
CO2: 23 mmol/L (ref 22–32)
Calcium: 9.8 mg/dL (ref 8.9–10.3)
Chloride: 93 mmol/L — ABNORMAL LOW (ref 98–111)
Creatinine, Ser: 1.72 mg/dL — ABNORMAL HIGH (ref 0.44–1.00)
GFR, Estimated: 35 mL/min — ABNORMAL LOW (ref >60)
Glucose, Bld: 114 mg/dL — ABNORMAL HIGH (ref 70–99)
Potassium: 3.3 mmol/L — ABNORMAL LOW (ref 3.5–5.1)
Sodium: 128 mmol/L — ABNORMAL LOW (ref 135–145)

## 2023-10-19 LAB — CBC
HCT: 26.7 % — ABNORMAL LOW (ref 36.0–46.0)
Hemoglobin: 9.2 g/dL — ABNORMAL LOW (ref 12.0–15.0)
MCH: 32.7 pg (ref 26.0–34.0)
MCHC: 34.5 g/dL (ref 30.0–36.0)
MCV: 95 fL (ref 80.0–100.0)
Platelets: 90 K/uL — ABNORMAL LOW (ref 150–400)
RBC: 2.81 MIL/uL — ABNORMAL LOW (ref 3.87–5.11)
RDW: 21.2 % — ABNORMAL HIGH (ref 11.5–15.5)
WBC: 9.1 K/uL (ref 4.0–10.5)
nRBC: 0 % (ref 0.0–0.2)

## 2023-10-19 LAB — PROTIME-INR
INR: 3.4 — ABNORMAL HIGH (ref 0.8–1.2)
Prothrombin Time: 35.9 s — ABNORMAL HIGH (ref 11.4–15.2)

## 2023-10-19 LAB — MAGNESIUM: Magnesium: 2.1 mg/dL (ref 1.7–2.4)

## 2023-10-19 LAB — LACTIC ACID, PLASMA: Lactic Acid, Venous: 1.9 mmol/L (ref 0.5–1.9)

## 2023-10-19 MED ORDER — LIDOCAINE-EPINEPHRINE 1 %-1:100000 IJ SOLN
INTRAMUSCULAR | Status: AC
  Filled 2023-10-19: qty 1

## 2023-10-19 NOTE — Consult Note (Signed)
 Consultation Note Date: 10/19/2023   Patient Name: Joan Wood  DOB: Nov 29, 1970  MRN: [id number]  Age / Sex: 53 y.o., female  PCP: Edman Meade PEDLAR, FNP Referring Physician: Patsy Lenis, MD  Reason for Consultation: Establishing goals of care  HPI/Patient Profile: 53 y.o. female  with past medical history of  s/p exploratory laparotomy for perforated gastric ulcer repair with omental patch on 7/27, ongoing alcohol use disorder, tobacco abuse, cirrhosis, HTN, GERD, anxiety/depression, multiple hospitalizations,  admitted on 10/03/2023 at [facility] with complaints of nausea, vomiting and coffee-ground emesis, symptoms ongoing for last 2 days, she reports last alcoholic beverage she had Monday or Sunday, for nausea, vomiting and diarrhea has been ongoing for last 2 days, and it turned today into coffee-ground emesis. Later (10/04/2023) transferred to [facility] ICU for further management. Now status post upper endoscopy, showing black discoloration of esophageal mucosa concerning for acute esophageal necrosis, portal hypertension, non-bleeding gastric and duodenal ulcers.   She had paracentesis on 10/15/2023, 4 liters ascitic fluid removed.  Worth to note that this is her 7 inpatient admissions, and has 4 ED encounter. Her most recent hospital stay was in August 2025 due to GI bleed and abdominal pain.   PMT has been consulted to assist with goals of care conversation. Patient/Family face treatment option decisions, advanced directive decisions and anticipatory care needs.   Family face treatment option decision, advance directive decisions and anticipatory care needs.   Clinical Assessment and Goals of Care:  I have reviewed medical records including EPIC notes, labs and imaging, assessed the patient and then met with patient to discuss diagnosis prognosis, GOC, EOL wishes, disposition and options.  I introduced Palliative Medicine as specialized medical care for people  living with serious illness. It focuses on providing relief from the symptoms and stress of a serious illness. The goal is to improve quality of life for both the patient and the family.  Patient was visited at bedside, no family or friends were present during the encounter. She appears chronically ill but is not in acute distress. She was able to communicate effectively and express her needs. Initially, she presented as anxious and tearful, though she was easily consoled. She is alert and oriented x4 and demonstrates a clear understanding of her current medical condition. She reports abdominal soreness 4/10, dull in nature that comes and go. She was able to articulate the reason for her hospitalization and the events leading up to her seeking hospital care.  The patient expressed frustration regarding her health, noting frequent hospitalizations in recent months. She also reported a complex history of gastric ulcers and ongoing gastrointestinal symptoms, including intermittent pain, pressure, nausea, and bloating over the past several months. She shared that she has cirrhosis that is likely due to her chronic alcoholism.  During discussion of her acute and chronic conditions, she became emotional, expressing distress over the cyclical nature of her illness, periods of stability followed by decompensation. Despite these challenges, she remains hopeful, stating, I am hopeful to be better and normal again. She acknowledges the progressive nature of her disease and the likelihood of requiring increased medical care. She is aware of the seriousness of her condition, referencing her frequent hospital admissions over the past six months. While she worries about her prognosis, she remains focused on taking things day by day in hopes of improvement.  We discussed her goals of care in the context of her high disease burden and risk of decompensation. I reviewed best- and worst-case scenarios and potential  treatment  pathways, including the limitations of medical interventions as her disease progresses. At this time, the patient clearly expressed her desire to remain full code, stating, I am afraid to die, please do anything and everything possible to keep me alive. I explained what full code status entails, including associated risks and potential complications, and how it may not always improve outcomes and could lead to further suffering. She affirmed her decision to remain full code but is open to revisiting comfort-focused care options in the future. We explored the differences between aggressive treatment and comfort care in light of her goals. Outpatient Hospice and Palliative Care services were discussed and offered.  The patient also shared that if her quality of life remains poor following full resuscitation, she would want her children to make decisions regarding withdrawal of life support. We discussed advance directives, including healthcare power of attorney and living will. She currently does not have these in place and noted that her children are unaware of her current health status. She stated, My children do not know about my health issues. I don't want them to worry or feel burdened. I encouraged her to reconsider this, given the trajectory of her illness, especially if she wishes her children to be involved in future healthcare decisions. She agreed to receive assistance in creating advance directives with support from our Chaplain.  I also contacted her friend and roommate, Joan Wood, by phone to gather insights and provide updates. Together, we encouraged the patient to consider informing her children about her health. She acknowledged the complicated family dynamics but expressed willingness to consider this advice.  I received brief report from bedside RN, she reports patient is scheduled for paracentesis today, but is being delayed due to soft blood pressure. Otherwise no significant  event overnight.   Created space and opportunity for family to explore thoughts and feelings regarding patient's current medical condition.   Discussed the importance of continued conversation with family and the medical providers regarding overall plan of care and treatment options, ensuring decisions are within the context of the patient's values and GOCs.   Questions and concerns were addressed.  Hard Choices booklet left for review. The family was encouraged to call with questions or concerns.  PMT will continue to support holistically.  Social History: The patient resides with her close friend and roommate. She has three adult children Joan Wood, Joan Wood, and Joan Wood, with whom she describes having a complicated relationship. At present, she prefers not to inform them about her health condition. 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 In her leisure time, she enjoys reading books and watching television.  Functional and Nutritional State: The patient is ambulatory at baseline with the assistance of a straight cane. She reports experiencing progressive weakness over the past several months. Her appetite has been suboptimal, though she expressed efforts to improve her intake.  Palliative Symptoms: Generalized weakness, abdominal pain and discomfort, anxiety.   Advance Directives: Patient currently does not have an advance directive in place.    Code Status: Full Code.    Primary Decision Maker: Patient    SUMMARY OF RECOMMENDATIONS    Code Status: Maintain Full Code Continue current scope of care Goal of care is medical stabilization and recovery to the extent this is possible I Introduced with patient the MOST form, pending completion The patient was encouraged to initiate open communication with her children regarding her current medical condition, including both her short- and long-term GOC.  Spiritual care consult to assist with AD  creation Continue to provide psycho-social and emotional support to patient and family Palliative medicine team will continue to follow.   Symptom Management: Per Primary team Palliative medicine is available to assist as needed.    Palliative Prophylaxis:  Aspiration, Bowel Regimen, Delirium Protocol, Frequent Pain Assessment, Oral Care, and Turn Reposition   Psycho-social/Spiritual:  Desire for further Chaplaincy support:yes  Prognosis:  Patient's prognosis is guarded due to her high disease burden, disease trajectory as evidenced by progressive functional decline and multiple hospitalizations over the last 6 months.   Discharge Planning: To Be Determined      Primary Diagnoses: Present on Admission:  GI bleed  Tobacco abuse  Nausea vomiting and diarrhea  Liver cirrhosis (HCC)  Hyponatremia  Alcohol use disorder  Acute on chronic blood loss anemia  Physical Exam Constitutional:      Appearance: chronically-ill, fatigued-looking HENT:     Head: Normocephalic and atraumatic. :  Cardiovascular:     Normal rate Pulmonary:     Effort: Pulmonary effort is normal. No respiratory distress.     Breath sounds: Normal breath sounds. No wheezing.  Abdominal:     General: Bowel sounds are normal. There is distension.     Palpations: Abdomen is soft.     Tenderness: positive for abdominal ternderness Musculoskeletal:        General: Normal range of motion.  Skin:    General: Skin is warm and dry.  Neurological:     General: No focal deficit present.     Mental Status: She is alert.  Psychiatric:        Mood and Affect: Mood normal.     Vital Signs: BP (!) 94/51 (BP Location: Left Arm)   Pulse 90   Temp 98.4 F (36.9 C) (Axillary)   Resp 16   Ht 5' 2 (1.575 m)   Wt 64.7 kg   SpO2 100%   BMI 26.10 kg/m  Pain Scale: 0-10 POSS *See Group Information*: S-Acceptable,Sleep, easy to arouse Pain Score: 0-No pain   SpO2: SpO2: 100 % O2 Device:SpO2: 100 % O2 Flow  Rate: .    Palliative Assessment/Data: 30 to 40%    Total time:I spent 90 minutes in the care of the patient today in the above activities and documenting the encounter.   Detailed review of medical records (labs, imaging, vital signs), medically appropriate exam, discussed with treatment team, counseling and education to patient, family, & staff, documenting clinical information, coordination of care.     Kathlyne JULIANNA Tracie Mickey, NP  Palliative Medicine Team Team phone # [phone]  Thank you for allowing the Palliative Medicine Team to assist in the care of this patient. Please utilize secure chat with additional questions, if there is no response within 30 minutes please call the above phone number.  Palliative Medicine Team providers are available by phone from 7am to 7pm daily and can be reached through the team cell phone.  Should this patient require assistance outside of these hours, please call the patient's attending physician.

## 2023-10-19 NOTE — Progress Notes (Addendum)
 Physical Therapy Treatment Patient Details Name: Joan Wood MRN: [id number] DOB: [birth year] Today's Date: 10/19/2023   History of Present Illness 53 y.o. female presents to [facility] hospital on 10/03/2023 with nausea/vomiting and bloody diarrhea. Pt underwent upper EGD on 9/25, concerning for possible acute esophageal necrosis. Pt also underwent paracentesis on 9/25 and 10/6. PMH includes decompensated cirrhosis, PUD, GERD, pancreatitis, OSA.   PT Comments  Pt requiring increased assistance this date with TotalAx2 to perform multiple rolls for pericare and linen change. Increased time and effort for all mobility with pt needing frequent cues and reminders of the task at hand. Limited initiation during session likely due to pt having a fear of pain in abdomen/back with movement. Pt able to perform a few bridges in supine before reporting fatigue from multiple rolls. Downgraded goals to reflect increased assistance needed for mobility. Continue to recommend <3hrs post acute rehab with acute PT to follow.   BP 93/55, 91 BPM   If plan is discharge home, recommend the following: A lot of help with walking and/or transfers;A lot of help with bathing/dressing/bathroom;Assistance with cooking/housework;Assist for transportation;Help with stairs or ramp for entrance   Can travel by private vehicle     No  Equipment Recommendations  Hospital bed;BSC/3in1       Precautions / Restrictions Precautions Precautions: Fall Recall of Precautions/Restrictions: Impaired Precaution/Restrictions Comments: orthostatic Restrictions Weight Bearing Restrictions Per Provider Order: No     Mobility  Bed Mobility Overal bed mobility: Needs Assistance Bed Mobility: Rolling Rolling: +2 for physical assistance, +2 for safety/equipment, Used rails, Total assist    General bed mobility comments: TotalAx2 with assist to guide hand to bed rail. Limited initiation with pt resisting movement due to pain. Multiple rolls for  pericare and linen change          Communication Communication Communication: No apparent difficulties  Cognition Arousal: Alert Behavior During Therapy: Anxious   PT - Cognitive impairments: No family/caregiver present to determine baseline, Awareness, Memory, Attention, Sequencing, Problem solving, Safety/Judgement    PT - Cognition Comments: Pleasantly confused with frequent re-direction and multimodal cueing Following commands: Impaired Following commands impaired: Follows one step commands with increased time, Follows one step commands inconsistently    Cueing Cueing Techniques: Verbal cues, Visual cues, Tactile cues         Pertinent Vitals/Pain Pain Assessment Pain Assessment: Faces Faces Pain Scale: Hurts whole lot Pain Location: abdomen, back Pain Descriptors / Indicators: Aching, Discomfort Pain Intervention(s): Limited activity within patient's tolerance, Monitored during session, Repositioned     PT Goals (current goals can now be found in the care plan section) Acute Rehab PT Goals PT Goal Formulation: With patient Time For Goal Achievement: 11/02/23 Progress towards PT goals: Not progressing toward goals - comment    Frequency    Min 2X/week       AM-PAC PT 6 Clicks Mobility   Outcome Measure  Help needed turning from your back to your side while in a flat bed without using bedrails?: Total Help needed moving from lying on your back to sitting on the side of a flat bed without using bedrails?: Total Help needed moving to and from a bed to a chair (including a wheelchair)?: Total Help needed standing up from a chair using your arms (e.g., wheelchair or bedside chair)?: Total Help needed to walk in hospital room?: Total Help needed climbing 3-5 steps with a railing? : Total 6 Click Score: 6    End of Session   Activity Tolerance:  Patient limited by fatigue;Patient limited by pain;Other (comment) (BP) Patient left: in bed;with call bell/phone  within reach;with bed alarm set Nurse Communication: Mobility status PT Visit Diagnosis: Other abnormalities of gait and mobility (R26.89);Muscle weakness (generalized) (M62.81)     Time: 8477-8397 PT Time Calculation (min) (ACUTE ONLY): 40 min  Charges:    $Therapeutic Activity: 23-37 mins PT General Charges $$ ACUTE PT VISIT: 1 Visit                    Kate ORN, PT, DPT Secure Chat Preferred  Rehab Office [phone]    Joan Wood Joan Wood 10/19/2023, 4:51 PM

## 2023-10-19 NOTE — Progress Notes (Signed)
 Progress Note    Joan Wood   FMW:[id number]  DOB: 1970-04-23  DOA: 10/03/2023     16 PCP: Edman Meade PEDLAR, FNP  Initial CC: N/V/D  Hospital Course: Joan Wood is a 53 year old female with decompensated cirrhosis, PUD with recent gastric ulcer perforation s/p ex-lap, GERD, pancreatitis, OSA who presented to [facility] with N/V/bloody diarrhea x 4 days.    She had coffee ground emesis in the ED. CTA GI neg for acute bleed, large ascites and new distal esophageal thickening.    GI consulted for acute blood loss anemia with Hg 8.3>7. She was given PPI, started on octreotide  gtt, rocephin , vitamin K , pain and nausea control and admitted to hospitalist.    Now s/p upper endoscopy showing black discoloration of esophageal mucosa concerning for acute esophageal necrosis, portal hypertension, non-bleeding gastric and duodenal ulcers.    Transferred to [facility] ICU for further management.    Significant Events 9/24: ED for upper GI bleed admit to hospitalist  9/25: 1 U PRBC, GI upper endoscopy w/ acute esophageal necrosis with recommendation for transfer to [facility] > NPO, s/p paracentesis 3.35L drained Transferred to hospitalists service 9/27 GI signed off 9/30 10/1 US  without sufficient ascites to tap 10/3 CT with findings concerning for appendicitis, surgery recommending antibiotics (high risk for surgery).  GI reconsulted to to progressive liver failure.  10/4 diagnostic para without evidence SBP 10/5 diuretics held with doubling creatinine 10/6 para with 4 L off.  Renal consult for progressive AKI.   Assessment & Plan:   Decompensated Alcoholic Cirrhosis Hepatic Encephalopathy - improved Ascites  Bilateral Pleural Effusions  Hypoalbuminemia  Thrombocytopenia  Bili fluctuating/some downtrend, INR rose, now possibly stabilizing s/p Vit K doses - S/p 5 days ceftriaxone  (now on abx with appendicitis below) -Completed trial of NAC as well - s/p paracentesis as needed; last on 10/6, 4L out -  Still remains somewhat volume overloaded and undergoing treatment with Lasix as able - Repeat paracentesis requested however after discussion with radiology, not appropriate for pursuing at this time.  Will be reevaluated on Monday - Ammonia level was elevated on 10/9 and lactulose increased; having significant liquid bowel movements she says; may need to consider rectal tube in efforts to keep ammonia level low -Repeat ammonia level on 10/11 - Palliative care consulted for GOC discussions as prognosis is very guarded and MELD-Na remains high at 35 (90-day mortality risk of approximately 52.6%)   Acute Kidney Injury, presumed ATN 2/2 contrast - not an HD candidate - seen by nephrology; signed off - Continue Foley for now but will perform TOV as able  Appendicitis - Appreciate gen surgery, she's high risk for surgery in setting of her cirrhosis - Continue Rocephin  and Flagyl  - Repeat CT A/P performed 10/16/2023 showing unchanged appendix, 1.1 cm in caliber   Acute upper GI bleed Acute esophageal necrosis Acute blood loss anemia Patient has h/o PUD, gastric ulcer perforation s/p sx lap 07/2023. Now s/p EGD showing grade D esophagitis, black discoloration mucosa in esophagus concerning for acute esophageal necrosis, portal hypertensive gastropathy, non bleeding gastric ulcer with clean ulcer base with apparent sutures.  No bleeding duodenal ulcers with a clean ulcer base.  No evidence of active bleeding or varices.   Transferred to [facility] given findings concerning for acute esophageal necrosis Gastrin level wnl  GI recommending carafate  x14 days, BID PPI until follow up with GI primary at [facility].  Needs ensure 2-3 times daily.     Abdominal Pain Suspect related  to abdominal distension from ascites +/- possible appendicitis  Repeat CT scan as noted    Orthostatic Hypotension S/p 1 unit pRBC midodrine  Follow orthostatics    Right Lower Extremity Edema Right Lower Extremity Pain LE US   - no evidence of deep vein thrombosis in lower extremity CT with moderate generalized subcutaneous edema throughout both visualized size, similar to recent pelvic CT (anasarca/third spacing vs cellulitis) She's on abx which would cover cellulitis, though lower suspicion for this Pain radiating all down leg, ? Neuropathic pain, Continue gabapentin     Hypervolemic Hyponatremia fluctuating Appreciate renal assistance with volume and hyponatremia   Hypomagnesemia  Hypophosphatemia  Hypokalemia Follow, replace as needed   Tobacco abuse Continue nicotine  patch.   Alcohol abuse Continue thiamine , folate, MVT.   Severe Protein Calorie Malnutrition Body mass index is 28.95 kg/m RD  Interval History:  No events overnight.  Still confused this morning but energy a little bit better.  Having a hard time using her call bell or finding her ice cup. She met with palliative care but remains medically aggressive and full code. Unable to undergo paracentesis after radiology evaluation today.  She will be reevaluated on Monday. I also discussed with her how sick she is and high risk of further decompensation.  She is appropriately scared of getting worse and/or dying.  We will continue medical management as best and safest possible.  At some point, she may further decline regardless which will not be unexpected.   Old records reviewed in assessment of this patient  Antimicrobials:   DVT prophylaxis:  SCDs Start: 10/03/23 2153   Code Status:   Code Status: Full Code  Mobility Assessment (Last 72 Hours)     Mobility Assessment     Row Name 10/19/23 0954 10/18/23 2000 10/18/23 1137 10/18/23 08:18:42 10/17/23 2002   Does the patient have exclusion criteria? No - Perform mobility assessment No - Perform mobility assessment -- Yes- Hold (Level 0) - Assessment complete No - Perform mobility assessment   What is the highest level of mobility based on the mobility assessment? Level 1 (Bedfast)  - Unable to balance while sitting on edge of bed Level 2 (Chairfast) - Balance while sitting on edge of bed and cannot stand Level 1 (Bedfast) - Unable to balance while sitting on edge of bed Level 2 (Chairfast) - Balance while sitting on edge of bed and cannot stand Level 2 (Chairfast) - Balance while sitting on edge of bed and cannot stand   Is the above level different from baseline mobility prior to current illness? Yes - Recommend PT order -- -- Yes - Recommend PT order Yes - Recommend PT order    Row Name 10/17/23 1200 10/17/23 0346 10/17/23 0012 10/16/23 2120 10/16/23 20:05:11   Does the patient have exclusion criteria? No - Perform mobility assessment No - Perform mobility assessment No - Perform mobility assessment No - Perform mobility assessment No - Perform mobility assessment   What is the highest level of mobility based on the mobility assessment? Level 2 (Chairfast) - Balance while sitting on edge of bed and cannot stand Level 2 (Chairfast) - Balance while sitting on edge of bed and cannot stand Level 2 (Chairfast) - Balance while sitting on edge of bed and cannot stand Level 2 (Chairfast) - Balance while sitting on edge of bed and cannot stand Level 2 (Chairfast) - Balance while sitting on edge of bed and cannot stand   Is the above level different from baseline mobility prior to  current illness? Yes - Recommend PT order Yes - Recommend PT order Yes - Recommend PT order Yes - Recommend PT order Yes - Recommend PT order      Barriers to discharge:  Disposition Plan:  TBD HH orders placed: TBD Status is: Inpt  Objective: Blood pressure (!) 94/51, pulse 90, temperature 98.4 F (36.9 C), temperature source Axillary, resp. rate 16, height 5' 2 (1.575 m), weight 64.7 kg, SpO2 100%.  Examination:  Physical Exam Constitutional:      Appearance: Normal appearance.  HENT:     Head: Normocephalic and atraumatic.     Mouth/Throat:     Mouth: Mucous membranes are moist.  Eyes:      Extraocular Movements: Extraocular movements intact.  Cardiovascular:     Rate and Rhythm: Normal rate and regular rhythm.  Pulmonary:     Effort: Pulmonary effort is normal. No respiratory distress.     Breath sounds: Normal breath sounds. No wheezing.  Abdominal:     General: Bowel sounds are normal. There is distension.     Palpations: Abdomen is soft.     Tenderness: There is abdominal tenderness in the right upper quadrant, right lower quadrant, left upper quadrant and left lower quadrant.  Musculoskeletal:        General: Normal range of motion.     Cervical back: Normal range of motion and neck supple.  Skin:    General: Skin is warm and dry.  Neurological:     General: No focal deficit present.     Mental Status: She is alert.  Psychiatric:        Mood and Affect: Mood normal.      Consultants:  GI, signed off 10/9 Nephrology, signed off 10/8  Procedures:    Data Reviewed: Results for orders placed or performed during the hospital encounter of 10/03/23 (from the past 24 hours)  Glucose, capillary     Status: None   Collection Time: 10/18/23  3:57 PM  Result Value Ref Range   Glucose-Capillary 98 70 - 99 mg/dL  Glucose, capillary     Status: Abnormal   Collection Time: 10/18/23  7:38 PM  Result Value Ref Range   Glucose-Capillary 106 (H) 70 - 99 mg/dL   Comment 1 Notify RN   Glucose, capillary     Status: Abnormal   Collection Time: 10/18/23 11:54 PM  Result Value Ref Range   Glucose-Capillary 100 (H) 70 - 99 mg/dL  Basic metabolic panel with GFR     Status: Abnormal   Collection Time: 10/19/23 12:35 AM  Result Value Ref Range   Sodium 128 (L) 135 - 145 mmol/L   Potassium 3.3 (L) 3.5 - 5.1 mmol/L   Chloride 93 (L) 98 - 111 mmol/L   CO2 23 22 - 32 mmol/L   Glucose, Bld 114 (H) 70 - 99 mg/dL   BUN 12 6 - 20 mg/dL   Creatinine, Ser 8.27 (H) 0.44 - 1.00 mg/dL   Calcium  9.8 8.9 - 10.3 mg/dL   GFR, Estimated 35 (L) >60 mL/min   Anion gap 12 5 - 15  CBC      Status: Abnormal   Collection Time: 10/19/23 12:35 AM  Result Value Ref Range   WBC 9.1 4.0 - 10.5 K/uL   RBC 2.81 (L) 3.87 - 5.11 MIL/uL   Hemoglobin 9.2 (L) 12.0 - 15.0 g/dL   HCT 73.2 (L) 63.9 - 53.9 %   MCV 95.0 80.0 - 100.0 fL   MCH 32.7 26.0 -  34.0 pg   MCHC 34.5 30.0 - 36.0 g/dL   RDW 78.7 (H) 88.4 - 84.4 %   Platelets 90 (L) 150 - 400 K/uL   nRBC 0.0 0.0 - 0.2 %  Lactic acid, plasma     Status: None   Collection Time: 10/19/23 12:35 AM  Result Value Ref Range   Lactic Acid, Venous 1.9 0.5 - 1.9 mmol/L  Protime-INR     Status: Abnormal   Collection Time: 10/19/23  1:34 AM  Result Value Ref Range   Prothrombin Time 35.9 (H) 11.4 - 15.2 seconds   INR 3.4 (H) 0.8 - 1.2  CBC with Differential/Platelet     Status: Abnormal   Collection Time: 10/19/23  1:34 AM  Result Value Ref Range   WBC 8.5 4.0 - 10.5 K/uL   RBC 2.37 (L) 3.87 - 5.11 MIL/uL   Hemoglobin 7.7 (L) 12.0 - 15.0 g/dL   HCT 77.2 (L) 63.9 - 53.9 %   MCV 95.8 80.0 - 100.0 fL   MCH 32.5 26.0 - 34.0 pg   MCHC 33.9 30.0 - 36.0 g/dL   RDW 78.6 (H) 88.4 - 84.4 %   Platelets 89 (L) 150 - 400 K/uL   nRBC 0.0 0.0 - 0.2 %   Neutrophils Relative % 68 %   Neutro Abs 5.8 1.7 - 7.7 K/uL   Lymphocytes Relative 11 %   Lymphs Abs 0.9 0.7 - 4.0 K/uL   Monocytes Relative 18 %   Monocytes Absolute 1.6 (H) 0.1 - 1.0 K/uL   Eosinophils Relative 1 %   Eosinophils Absolute 0.1 0.0 - 0.5 K/uL   Basophils Relative 1 %   Basophils Absolute 0.0 0.0 - 0.1 K/uL   WBC Morphology MORPHOLOGY UNREMARKABLE    Smear Review Normal platelet morphology    Immature Granulocytes 1 %   Abs Immature Granulocytes 0.10 (H) 0.00 - 0.07 K/uL   Target Cells PRESENT   Comprehensive metabolic panel with GFR     Status: Abnormal   Collection Time: 10/19/23  1:34 AM  Result Value Ref Range   Sodium 128 (L) 135 - 145 mmol/L   Potassium 3.5 3.5 - 5.1 mmol/L   Chloride 95 (L) 98 - 111 mmol/L   CO2 22 22 - 32 mmol/L   Glucose, Bld 112 (H) 70 - 99 mg/dL    BUN 12 6 - 20 mg/dL   Creatinine, Ser 8.27 (H) 0.44 - 1.00 mg/dL   Calcium  9.4 8.9 - 10.3 mg/dL   Total Protein 5.3 (L) 6.5 - 8.1 g/dL   Albumin  3.0 (L) 3.5 - 5.0 g/dL   AST 60 (H) 15 - 41 U/L   ALT 16 0 - 44 U/L   Alkaline Phosphatase 44 38 - 126 U/L   Total Bilirubin 7.7 (H) 0.0 - 1.2 mg/dL   GFR, Estimated 35 (L) >60 mL/min   Anion gap 11 5 - 15  Magnesium      Status: None   Collection Time: 10/19/23  1:34 AM  Result Value Ref Range   Magnesium  2.1 1.7 - 2.4 mg/dL  Glucose, capillary     Status: Abnormal   Collection Time: 10/19/23  3:40 AM  Result Value Ref Range   Glucose-Capillary 109 (H) 70 - 99 mg/dL   Comment 1 Notify RN   Glucose, capillary     Status: None   Collection Time: 10/19/23  7:53 AM  Result Value Ref Range   Glucose-Capillary 96 70 - 99 mg/dL  Glucose, capillary  Status: None   Collection Time: 10/19/23 11:25 AM  Result Value Ref Range   Glucose-Capillary 79 70 - 99 mg/dL    I have reviewed pertinent nursing notes, vitals, labs, and images as necessary. I have ordered labwork to follow up on as indicated.  I have reviewed the last notes from staff over past 24 hours. I have discussed patient's care plan and test results with nursing staff, CM/SW, and other staff as appropriate.  Time spent: Greater than 50% of the 55 minute visit was spent in counseling/coordination of care for the patient as laid out in the A&P.   LOS: 16 days   Alm Apo, MD [facility] 10/19/2023, 1:50 PM

## 2023-10-19 NOTE — TOC Progression Note (Signed)
 Transition of Care [facility]) - Progression Note    Patient Details  Name: SAYDEE ZOLMAN MRN: [id number] Date of Birth: 1970/04/06  Transition of Care [facility]) CM/SW Contact  Andrez JULIANNA George, RN Phone Number: 10/19/2023, 10:07 AM  Clinical Narrative:     Pt with low BPs today. On schedule for paracentesis. Na 128.  IP Care management following.  Expected Discharge Plan: OP Rehab Barriers to Discharge: Continued Medical Work up               Expected Discharge Plan and Services In-house Referral: Clinical Social Work Discharge Planning Services: CM Consult Post Acute Care Choice: Home Health Living arrangements for the past 2 months: Single Family Home                 DME Arranged: Walker rolling   Date DME Agency Contacted: 10/08/23   Representative spoke with at DME Agency: [name]             Social Drivers of Health (SDOH) Interventions SDOH Screenings   Food Insecurity: No Food Insecurity (10/03/2023)  Housing: Low Risk  (10/03/2023)  Transportation Needs: No Transportation Needs (10/03/2023)  Recent Concern: Transportation Needs - Unmet Transportation Needs (08/20/2023)  Utilities: Not At Risk (10/03/2023)  Depression (PHQ2-9): High Risk (07/30/2023)  Social Connections: Socially Isolated (10/03/2023)  Tobacco Use: High Risk (10/04/2023)    Readmission Risk Interventions    10/04/2023    9:02 AM 09/03/2023   12:31 PM 09/02/2023    9:01 AM  Readmission Risk Prevention Plan  Transportation Screening Complete Complete Complete  PCP or Specialist Appt within 3-5 Days   Complete  HRI or Home Care Consult   Complete  Social Work Consult for Recovery Care Planning/Counseling   Complete  Palliative Care Screening   Not Applicable  Medication Review [employer]) Complete Complete Complete  HRI or Home Care Consult Complete Complete   SW Recovery Care/Counseling Consult Complete Complete   Palliative Care Screening Not Applicable Not Applicable   Skilled Nursing  Facility Not Applicable Patient Refused

## 2023-10-20 DIAGNOSIS — K7290 Hepatic failure, unspecified without coma: Secondary | ICD-10-CM | POA: Diagnosis not present

## 2023-10-20 DIAGNOSIS — K922 Gastrointestinal hemorrhage, unspecified: Secondary | ICD-10-CM | POA: Diagnosis not present

## 2023-10-20 DIAGNOSIS — D62 Acute posthemorrhagic anemia: Secondary | ICD-10-CM | POA: Diagnosis not present

## 2023-10-20 DIAGNOSIS — Z515 Encounter for palliative care: Secondary | ICD-10-CM | POA: Diagnosis not present

## 2023-10-20 DIAGNOSIS — R112 Nausea with vomiting, unspecified: Secondary | ICD-10-CM | POA: Diagnosis not present

## 2023-10-20 DIAGNOSIS — Z7189 Other specified counseling: Secondary | ICD-10-CM | POA: Diagnosis not present

## 2023-10-20 DIAGNOSIS — K3580 Unspecified acute appendicitis: Secondary | ICD-10-CM | POA: Diagnosis not present

## 2023-10-20 LAB — CBC WITH DIFFERENTIAL/PLATELET
Basophils Absolute: 0 K/uL (ref 0.0–0.1)
Basophils Relative: 0 %
Eosinophils Absolute: 0 K/uL (ref 0.0–0.5)
Eosinophils Relative: 0 %
HCT: 20.7 % — ABNORMAL LOW (ref 36.0–46.0)
Hemoglobin: 7.2 g/dL — ABNORMAL LOW (ref 12.0–15.0)
Lymphocytes Relative: 15 %
Lymphs Abs: 1.1 K/uL (ref 0.7–4.0)
MCH: 33 pg (ref 26.0–34.0)
MCHC: 34.8 g/dL (ref 30.0–36.0)
MCV: 95 fL (ref 80.0–100.0)
Monocytes Absolute: 0.7 K/uL (ref 0.1–1.0)
Monocytes Relative: 9 %
Neutro Abs: 5.6 K/uL (ref 1.7–7.7)
Neutrophils Relative %: 76 %
Platelets: 83 K/uL — ABNORMAL LOW (ref 150–400)
RBC: 2.18 MIL/uL — ABNORMAL LOW (ref 3.87–5.11)
RDW: 21.2 % — ABNORMAL HIGH (ref 11.5–15.5)
WBC: 7.4 K/uL (ref 4.0–10.5)
nRBC: 0 % (ref 0.0–0.2)

## 2023-10-20 LAB — COMPREHENSIVE METABOLIC PANEL WITH GFR
ALT: 15 U/L (ref 0–44)
AST: 50 U/L — ABNORMAL HIGH (ref 15–41)
Albumin: 3.7 g/dL (ref 3.5–5.0)
Alkaline Phosphatase: 40 U/L (ref 38–126)
Anion gap: 11 (ref 5–15)
BUN: 12 mg/dL (ref 6–20)
CO2: 22 mmol/L (ref 22–32)
Calcium: 9.5 mg/dL (ref 8.9–10.3)
Chloride: 94 mmol/L — ABNORMAL LOW (ref 98–111)
Creatinine, Ser: 1 mg/dL (ref 0.44–1.00)
GFR, Estimated: >60 mL/min (ref >60)
Glucose, Bld: 87 mg/dL (ref 70–99)
Potassium: 3.5 mmol/L (ref 3.5–5.1)
Sodium: 127 mmol/L — ABNORMAL LOW (ref 135–145)
Total Bilirubin: 6.9 mg/dL — ABNORMAL HIGH (ref 0.0–1.2)
Total Protein: 5.7 g/dL — ABNORMAL LOW (ref 6.5–8.1)

## 2023-10-20 LAB — GLUCOSE, CAPILLARY
Glucose-Capillary: 111 mg/dL — ABNORMAL HIGH (ref 70–99)
Glucose-Capillary: 122 mg/dL — ABNORMAL HIGH (ref 70–99)
Glucose-Capillary: 92 mg/dL (ref 70–99)

## 2023-10-20 LAB — CULTURE, BODY FLUID W GRAM STAIN -BOTTLE: Culture: NO GROWTH

## 2023-10-20 LAB — PROTIME-INR
INR: 2.7 — ABNORMAL HIGH (ref 0.8–1.2)
Prothrombin Time: 29.7 s — ABNORMAL HIGH (ref 11.4–15.2)

## 2023-10-20 LAB — MAGNESIUM: Magnesium: 2 mg/dL (ref 1.7–2.4)

## 2023-10-20 LAB — AMMONIA: Ammonia: 78 umol/L — ABNORMAL HIGH (ref 9–35)

## 2023-10-20 NOTE — Plan of Care (Signed)

## 2023-10-20 NOTE — Progress Notes (Signed)
 Palliative Medicine Inpatient Follow Up Note   HPI: 53 y.o. female  with past medical history of  s/p exploratory laparotomy for perforated gastric ulcer repair with omental patch on 7/27, ongoing alcohol use disorder, tobacco abuse, cirrhosis, HTN, GERD, anxiety/depression, multiple hospitalizations,  admitted on 10/03/2023 at [facility] with complaints of nausea, vomiting and coffee-ground emesis, symptoms ongoing for last 2 days, she reports last alcoholic beverage she had Monday or Sunday, for nausea, vomiting and diarrhea has been ongoing for last 2 days, and it turned today into coffee-ground emesis. Later (10/04/2023) transferred to [facility] ICU for further management. Now status post upper endoscopy, showing black discoloration of esophageal mucosa concerning for acute esophageal necrosis, portal hypertension, non-bleeding gastric and duodenal ulcers.    She had paracentesis on 10/15/2023, 4 liters ascitic fluid removed.   Worth to note that this is her 7 inpatient admissions, and has 4 ED encounter. Her most recent hospital stay was in August 2025 due to GI bleed and abdominal pain.    PMT has been consulted to assist with goals of care conversation. Patient/Family face treatment option decisions, advanced directive decisions and anticipatory care needs.    Family face treatment option decision, advance directive decisions and anticipatory care needs.    Today's Discussion 10/20/2023  *Please note that this is a verbal dictation therefore any spelling or grammatical errors are due to the Dragon Medical One system interpretation.  Chart reviewed inclusive of vital signs, progress notes, laboratory results, and diagnostic images.   The patient was evaluated at the bedside today. She appeared chronically ill but remained alert and oriented, demonstrating the ability to clearly express her needs. During our conversation, she reported feeling tired and exhausted, attributing her fatigue to her overall  condition. She denied experiencing any pain, particularly abdominal discomfort, and stated that she currently has no symptoms, which she believes is due to her consistent adherence to her prescribed medications. No new concerns were brought forward during the encounter.  We discussed her current treatment plan and revisited her goals of care. The patient reaffirmed her desire to maintain full code status, expressing hope for clinical improvement and a preference to continue receiving the full scope of available medical interventions. She demonstrated insight into her medical condition and acknowledged that her prognosis remains guarded given the complexity of both her acute and chronic illnesses.  I also revisited a conversation from yesterday regarding the involvement of her children in her care. Although she is not yet ready to reach out or discuss her medical status with any of them, she did express a clear preference for her eldest daughter, Duwaine, to serve as her health care decision-maker in the event that she becomes unable to make decisions for herself. This aligns with her previously stated wishes and reflects her thoughtful consideration of future medical scenarios.  Additionally, we discussed her interest in completing advance care planning documents, including health care power of attorney, and living will, during her current hospitalization. I offered support from our [facility] to assist with this process, which she welcomed. She indicated that Monday or Tuesday would be the most appropriate time for her to engage in these discussions and complete the necessary paperwork.  Overall, the patient remains clinically stable and is doing well at this time. She continues to participate actively in her care and decision-making, and her preferences are being respected and supported.  Created space and opportunity for patient to explore thoughts feelings and fears regarding current medical  situation.  Patient face treatment option decisions, advanced directive decisions and anticipatory care needs.    Questions and concerns addressed   Palliative Support Provided.   Objective Assessment: Vital Signs Vitals:   10/20/23 0815 10/20/23 0843  BP: 99/61 99/61  Pulse: 87   Resp:  16  Temp: 98.4 F (36.9 C)   SpO2: 100%     Intake/Output Summary (Last 24 hours) at 10/20/2023 1433 Last data filed at 10/19/2023 1800 Gross per 24 hour  Intake 663.11 ml  Output 150 ml  Net 513.11 ml   Last Weight  Most recent update: 10/20/2023  5:36 AM    Weight  64.6 kg (142 lb 6.4 oz)                SUMMARY OF RECOMMENDATIONS   Code Status: Maintain Full Code Continue current scope of care Goal of care is medical stabilization and recovery to the extent this is possible I Introduced with patient the MOST form, pending completion The patient was encouraged to initiate open communication with her children regarding her current medical condition, including both her short- and long-term GOC. Spiritual care consult to assist with AD creation (Consult placed 10/20/2023) Continue to provide psycho-social and emotional support to patient and family Palliative medicine team will continue to follow.    Symptom Management: Per Primary team Palliative medicine is available to assist as needed.      Time Spent: 50 minutes  Detailed review of medical records (labs, imaging, vital signs), medically appropriate exam, discussed with treatment team, counseling and education to patient, family, & staff, documenting clinical information, coordination of care.   ______________________________________________________________________________________ Kathlyne Bolder NP-C [place] Palliative Medicine Team Team Cell Phone: [phone] Please utilize secure chat with additional questions, if there is no response within 30 minutes please call the above phone number  Palliative Medicine  Team providers are available by phone from 7am to 7pm daily and can be reached through the team cell phone.  Should this patient require assistance outside of these hours, please call the patient's attending physician.

## 2023-10-20 NOTE — Progress Notes (Signed)
 Progress Note    Joan Wood   FMW:[id number]  DOB: 01-18-1970  DOA: 10/03/2023     17 PCP: Edman Meade PEDLAR, FNP  Initial CC: N/V/D  Hospital Course: [name] is a 53 year old female with decompensated cirrhosis, PUD with recent gastric ulcer perforation s/p ex-lap, GERD, pancreatitis, OSA who presented to [facility] with N/V/bloody diarrhea x 4 days.    She had coffee ground emesis in the ED. CTA GI neg for acute bleed, large ascites and new distal esophageal thickening.    GI consulted for acute blood loss anemia with Hg 8.3>7. She was given PPI, started on octreotide  gtt, rocephin , vitamin K , pain and nausea control and admitted to hospitalist.    Now s/p upper endoscopy showing black discoloration of esophageal mucosa concerning for acute esophageal necrosis, portal hypertension, non-bleeding gastric and duodenal ulcers.    Transferred to [facility] ICU for further management.    Significant Events 9/24: ED for upper GI bleed admit to hospitalist  9/25: 1 U PRBC, GI upper endoscopy w/ acute esophageal necrosis with recommendation for transfer to [facility] > NPO, s/p paracentesis 3.35L drained Transferred to hospitalists service 9/27 GI signed off 9/30 10/1 US  without sufficient ascites to tap 10/3 CT with findings concerning for appendicitis, surgery recommending antibiotics (high risk for surgery).  GI reconsulted to to progressive liver failure.  10/4 diagnostic para without evidence SBP 10/5 diuretics held with doubling creatinine 10/6 para with 4 L off.  Renal consult for progressive AKI.   Assessment & Plan:   Decompensated Alcoholic Cirrhosis Hepatic Encephalopathy - improved Ascites  Bilateral Pleural Effusions  Hypoalbuminemia  Thrombocytopenia  Bili fluctuating/some downtrend, INR rose, now possibly stabilizing s/p Vit K doses - S/p 5 days ceftriaxone  (now on abx with appendicitis below) -Completed trial of NAC as well - s/p paracentesis as needed; last on 10/6, 4L out -  Still remains somewhat volume overloaded and undergoing treatment with Lasix as able - Repeat paracentesis requested however after discussion with radiology, not appropriate for pursuing at this time.  Will be reevaluated on Monday - Ammonia level was elevated on 10/9 and lactulose increased; having significant liquid bowel movements she says; may need to consider rectal tube in efforts to keep ammonia level low -Repeat ammonia level on 10/11 still elevated at 78; continue lactulose - Palliative care consulted for GOC discussions as prognosis is very guarded and MELD-Na remains high at 35 (90-day mortality risk of approximately 52.6%)   Acute Kidney Injury, presumed ATN 2/2 contrast - not an HD candidate - seen by nephrology; signed off - Continue Foley for now but will perform TOV as able  Appendicitis - Appreciate gen surgery, she's high risk for surgery in setting of her cirrhosis - Continue Rocephin  and Flagyl  - Repeat CT A/P performed 10/16/2023 showing unchanged appendix, 1.1 cm in caliber   Acute upper GI bleed Acute esophageal necrosis Acute blood loss anemia Patient has h/o PUD, gastric ulcer perforation s/p sx lap 07/2023. Now s/p EGD showing grade D esophagitis, black discoloration mucosa in esophagus concerning for acute esophageal necrosis, portal hypertensive gastropathy, non bleeding gastric ulcer with clean ulcer base with apparent sutures.  No bleeding duodenal ulcers with a clean ulcer base.  No evidence of active bleeding or varices.   Transferred to [facility] given findings concerning for acute esophageal necrosis Gastrin level wnl  GI recommending carafate  x14 days, BID PPI until follow up with GI primary at [facility].  Needs ensure 2-3 times daily.  Abdominal Pain Suspect related to abdominal distension from ascites +/- possible appendicitis  Repeat CT scan as noted    Orthostatic Hypotension S/p 1 unit pRBC midodrine  Follow orthostatics    Right Lower Extremity  Edema Right Lower Extremity Pain LE US  - no evidence of deep vein thrombosis in lower extremity CT with moderate generalized subcutaneous edema throughout both visualized size, similar to recent pelvic CT (anasarca/third spacing vs cellulitis) She's on abx which would cover cellulitis, though lower suspicion for this Pain radiating all down leg, ? Neuropathic pain Continue gabapentin     Hypervolemic Hyponatremia fluctuating Appreciate renal assistance with volume and hyponatremia   Hypomagnesemia  Hypophosphatemia  Hypokalemia Follow, replace as needed   Tobacco abuse Continue nicotine  patch.   Alcohol abuse Continue thiamine , folate, MVT.   Severe Protein Calorie Malnutrition Body mass index is 28.95 kg/m RD  Interval History:  No events overnight. Tearful this am about thinking about talking/telling her children she is sick/in hospital. Abd pain a little better however.    Old records reviewed in assessment of this patient  Antimicrobials:   DVT prophylaxis:  SCDs Start: 10/03/23 2153   Code Status:   Code Status: Full Code  Mobility Assessment (Last 72 Hours)     Mobility Assessment     Row Name 10/19/23 2000 10/19/23 1647 10/19/23 0954 10/18/23 2000 10/18/23 1137   Does the patient have exclusion criteria? No - Perform mobility assessment -- No - Perform mobility assessment No - Perform mobility assessment --   What is the highest level of mobility based on the mobility assessment? Level 1 (Bedfast) - Unable to balance while sitting on edge of bed Level 1 (Bedfast) - Unable to balance while sitting on edge of bed Level 1 (Bedfast) - Unable to balance while sitting on edge of bed Level 2 (Chairfast) - Balance while sitting on edge of bed and cannot stand Level 1 (Bedfast) - Unable to balance while sitting on edge of bed   Is the above level different from baseline mobility prior to current illness? Yes - Recommend PT order -- Yes - Recommend PT order -- --     Row Name 10/18/23 08:18:42 10/17/23 2002         Does the patient have exclusion criteria? Yes- Hold (Level 0) - Assessment complete No - Perform mobility assessment      What is the highest level of mobility based on the mobility assessment? Level 2 (Chairfast) - Balance while sitting on edge of bed and cannot stand Level 2 (Chairfast) - Balance while sitting on edge of bed and cannot stand      Is the above level different from baseline mobility prior to current illness? Yes - Recommend PT order Yes - Recommend PT order         Barriers to discharge:  Disposition Plan:  TBD HH orders placed: TBD Status is: Inpt  Objective: Blood pressure 99/61, pulse 87, temperature 98.4 F (36.9 C), temperature source Oral, resp. rate 16, height 5' 2 (1.575 m), weight 64.6 kg, SpO2 100%.  Examination:  Physical Exam Constitutional:      Appearance: Normal appearance.  HENT:     Head: Normocephalic and atraumatic.     Mouth/Throat:     Mouth: Mucous membranes are moist.  Eyes:     Extraocular Movements: Extraocular movements intact.  Cardiovascular:     Rate and Rhythm: Normal rate and regular rhythm.  Pulmonary:     Effort: Pulmonary effort is normal. No respiratory  distress.     Breath sounds: Normal breath sounds. No wheezing.  Abdominal:     General: Bowel sounds are normal. There is distension.     Palpations: Abdomen is soft.     Tenderness: There is abdominal tenderness in the right upper quadrant, right lower quadrant, left upper quadrant and left lower quadrant.     Comments: Persistent weeping of clear ascites fluid from right abdomen from prior paracentesis site  Musculoskeletal:        General: Normal range of motion.     Cervical back: Normal range of motion and neck supple.  Skin:    General: Skin is warm and dry.  Neurological:     Mental Status: She is alert.     Comments: No focal deficits.  Mild confusion appreciated along with mild asterixis  Psychiatric:         Mood and Affect: Mood normal.      Consultants:  GI, signed off 10/9 Nephrology, signed off 10/8  Procedures:    Data Reviewed: Results for orders placed or performed during the hospital encounter of 10/03/23 (from the past 24 hours)  Glucose, capillary     Status: None   Collection Time: 10/19/23  5:34 PM  Result Value Ref Range   Glucose-Capillary 85 70 - 99 mg/dL  Glucose, capillary     Status: Abnormal   Collection Time: 10/19/23  7:43 PM  Result Value Ref Range   Glucose-Capillary 109 (H) 70 - 99 mg/dL  Glucose, capillary     Status: Abnormal   Collection Time: 10/19/23 11:52 PM  Result Value Ref Range   Glucose-Capillary 103 (H) 70 - 99 mg/dL  Protime-INR     Status: Abnormal   Collection Time: 10/20/23  3:45 AM  Result Value Ref Range   Prothrombin Time 29.7 (H) 11.4 - 15.2 seconds   INR 2.7 (H) 0.8 - 1.2  CBC with Differential/Platelet     Status: Abnormal   Collection Time: 10/20/23  3:45 AM  Result Value Ref Range   WBC 7.4 4.0 - 10.5 K/uL   RBC 2.18 (L) 3.87 - 5.11 MIL/uL   Hemoglobin 7.2 (L) 12.0 - 15.0 g/dL   HCT 79.2 (L) 63.9 - 53.9 %   MCV 95.0 80.0 - 100.0 fL   MCH 33.0 26.0 - 34.0 pg   MCHC 34.8 30.0 - 36.0 g/dL   RDW 78.7 (H) 88.4 - 84.4 %   Platelets 83 (L) 150 - 400 K/uL   nRBC 0.0 0.0 - 0.2 %   Neutrophils Relative % 76 %   Neutro Abs 5.6 1.7 - 7.7 K/uL   Lymphocytes Relative 15 %   Lymphs Abs 1.1 0.7 - 4.0 K/uL   Monocytes Relative 9 %   Monocytes Absolute 0.7 0.1 - 1.0 K/uL   Eosinophils Relative 0 %   Eosinophils Absolute 0.0 0.0 - 0.5 K/uL   Basophils Relative 0 %   Basophils Absolute 0.0 0.0 - 0.1 K/uL   WBC Morphology See Note    Smear Review See Note    Polychromasia PRESENT    Target Cells PRESENT   Comprehensive metabolic panel with GFR     Status: Abnormal   Collection Time: 10/20/23  3:45 AM  Result Value Ref Range   Sodium 127 (L) 135 - 145 mmol/L   Potassium 3.5 3.5 - 5.1 mmol/L   Chloride 94 (L) 98 - 111 mmol/L   CO2 22  22 - 32 mmol/L   Glucose, Bld 87 70 -  99 mg/dL   BUN 12 6 - 20 mg/dL   Creatinine, Ser 8.99 0.44 - 1.00 mg/dL   Calcium  9.5 8.9 - 10.3 mg/dL   Total Protein 5.7 (L) 6.5 - 8.1 g/dL   Albumin  3.7 3.5 - 5.0 g/dL   AST 50 (H) 15 - 41 U/L   ALT 15 0 - 44 U/L   Alkaline Phosphatase 40 38 - 126 U/L   Total Bilirubin 6.9 (H) 0.0 - 1.2 mg/dL   GFR, Estimated >39 >39 mL/min   Anion gap 11 5 - 15  Magnesium      Status: None   Collection Time: 10/20/23  3:45 AM  Result Value Ref Range   Magnesium  2.0 1.7 - 2.4 mg/dL  Ammonia     Status: Abnormal   Collection Time: 10/20/23  3:45 AM  Result Value Ref Range   Ammonia 78 (H) 9 - 35 umol/L  Glucose, capillary     Status: None   Collection Time: 10/20/23  4:17 AM  Result Value Ref Range   Glucose-Capillary 92 70 - 99 mg/dL    I have reviewed pertinent nursing notes, vitals, labs, and images as necessary. I have ordered labwork to follow up on as indicated.  I have reviewed the last notes from staff over past 24 hours. I have discussed patient's care plan and test results with nursing staff, CM/SW, and other staff as appropriate.  Time spent: Greater than 50% of the 55 minute visit was spent in counseling/coordination of care for the patient as laid out in the A&P.   LOS: 17 days   Alm Apo, MD [facility] 10/20/2023, 1:26 PM

## 2023-10-21 DIAGNOSIS — Z7189 Other specified counseling: Secondary | ICD-10-CM | POA: Diagnosis not present

## 2023-10-21 DIAGNOSIS — K7290 Hepatic failure, unspecified without coma: Secondary | ICD-10-CM | POA: Diagnosis not present

## 2023-10-21 LAB — GLUCOSE, CAPILLARY
Glucose-Capillary: 105 mg/dL — ABNORMAL HIGH (ref 70–99)
Glucose-Capillary: 94 mg/dL (ref 70–99)
Glucose-Capillary: 103 mg/dL — ABNORMAL HIGH (ref 70–99)
Glucose-Capillary: 92 mg/dL (ref 70–99)
Glucose-Capillary: 134 mg/dL — ABNORMAL HIGH (ref 70–99)

## 2023-10-21 LAB — CBC WITH DIFFERENTIAL/PLATELET
Basophils Absolute: 0.1 K/uL (ref 0.0–0.1)
Basophils Relative: 1 %
Eosinophils Absolute: 0 K/uL (ref 0.0–0.5)
Eosinophils Relative: 0 %
HCT: 23.4 % — ABNORMAL LOW (ref 36.0–46.0)
Hemoglobin: 8 g/dL — ABNORMAL LOW (ref 12.0–15.0)
Lymphocytes Relative: 7 %
Lymphs Abs: 0.7 K/uL (ref 0.7–4.0)
MCH: 32.3 pg (ref 26.0–34.0)
MCHC: 34.2 g/dL (ref 30.0–36.0)
MCV: 94.4 fL (ref 80.0–100.0)
Monocytes Absolute: 0.8 K/uL (ref 0.1–1.0)
Monocytes Relative: 8 %
Neutro Abs: 8.8 K/uL — ABNORMAL HIGH (ref 1.7–7.7)
Neutrophils Relative %: 84 %
Platelets: 101 K/uL — ABNORMAL LOW (ref 150–400)
RBC: 2.48 MIL/uL — ABNORMAL LOW (ref 3.87–5.11)
RDW: 21.7 % — ABNORMAL HIGH (ref 11.5–15.5)
WBC: 10.5 K/uL (ref 4.0–10.5)
nRBC: 0 % (ref 0.0–0.2)

## 2023-10-21 LAB — COMPREHENSIVE METABOLIC PANEL WITH GFR
ALT: 13 U/L (ref 0–44)
AST: 56 U/L — ABNORMAL HIGH (ref 15–41)
Albumin: 3.7 g/dL (ref 3.5–5.0)
Alkaline Phosphatase: 59 U/L (ref 38–126)
Anion gap: 10 (ref 5–15)
BUN: 13 mg/dL (ref 6–20)
CO2: 22 mmol/L (ref 22–32)
Calcium: 9.3 mg/dL (ref 8.9–10.3)
Chloride: 93 mmol/L — ABNORMAL LOW (ref 98–111)
Creatinine, Ser: 0.78 mg/dL (ref 0.44–1.00)
GFR, Estimated: >60 mL/min (ref >60)
Glucose, Bld: 96 mg/dL (ref 70–99)
Potassium: 3.6 mmol/L (ref 3.5–5.1)
Sodium: 125 mmol/L — ABNORMAL LOW (ref 135–145)
Total Bilirubin: 6.8 mg/dL — ABNORMAL HIGH (ref 0.0–1.2)
Total Protein: 5.8 g/dL — ABNORMAL LOW (ref 6.5–8.1)

## 2023-10-21 LAB — PROTIME-INR
INR: 2.9 — ABNORMAL HIGH (ref 0.8–1.2)
Prothrombin Time: 32 s — ABNORMAL HIGH (ref 11.4–15.2)

## 2023-10-21 LAB — MAGNESIUM: Magnesium: 2 mg/dL (ref 1.7–2.4)

## 2023-10-21 NOTE — Plan of Care (Signed)

## 2023-10-21 NOTE — Progress Notes (Signed)
 Progress Note    Joan Wood   FMW:[id number]  DOB: [birth year]  DOA: 10/03/2023     18 PCP: Edman Meade PEDLAR, FNP  Initial CC: N/V/D  Hospital Course: Joan Wood is a 53 year old female with decompensated cirrhosis, PUD with recent gastric ulcer perforation s/p ex-lap, GERD, pancreatitis, OSA who presented to [facility] with N/V/bloody diarrhea x 4 days.    She had coffee ground emesis in the ED. CTA GI neg for acute bleed, large ascites and new distal esophageal thickening.    GI consulted for acute blood loss anemia with Hg 8.3>7. She was given PPI, started on octreotide  gtt, rocephin , vitamin K , pain and nausea control and admitted to hospitalist.    Now s/p upper endoscopy showing black discoloration of esophageal mucosa concerning for acute esophageal necrosis, portal hypertension, non-bleeding gastric and duodenal ulcers.    Transferred to [facility] ICU for further management.    Significant Events 9/24: ED for upper GI bleed admit to hospitalist  9/25: 1 U PRBC, GI upper endoscopy w/ acute esophageal necrosis with recommendation for transfer to [facility] > NPO, s/p paracentesis 3.35L drained Transferred to hospitalists service 9/27 GI signed off 9/30 10/1 US  without sufficient ascites to tap 10/3 CT with findings concerning for appendicitis, surgery recommending antibiotics (high risk for surgery).  GI reconsulted to to progressive liver failure.  10/4 diagnostic para without evidence SBP 10/5 diuretics held with doubling creatinine 10/6 para with 4 L off.  Renal consult for progressive AKI.   Assessment & Plan:   Decompensated Alcoholic Cirrhosis Hepatic Encephalopathy - improved Ascites  Bilateral Pleural Effusions  Hypoalbuminemia  Thrombocytopenia  Bili fluctuating/some downtrend, INR rose, now possibly stabilizing s/p Vit K doses - S/p 5 days ceftriaxone  (now on abx with appendicitis below) -Completed trial of NAC as well - s/p paracentesis as needed; last on 10/6, 4L out -  Still remains somewhat volume overloaded and undergoing treatment with Lasix as able - Repeat paracentesis requested however after discussion with radiology, not appropriate for pursuing at this time.  Will be reevaluated on Monday - Ammonia level was elevated on 10/9 and lactulose increased; having significant liquid bowel movements she says; may need to consider rectal tube in efforts to keep ammonia level low -Repeat ammonia level on 10/11 still elevated at 78; continue lactulose - Palliative care consulted for GOC discussions as prognosis is very guarded and MELD-Na remains high at 35 (90-day mortality risk of approximately 52.6%)   Acute Wood Injury, presumed ATN 2/2 contrast - not an HD candidate - seen by nephrology; signed off - Continue Foley for now but will perform TOV as able  Appendicitis - Appreciate gen surgery, she's high risk for surgery in setting of her cirrhosis - Continue Rocephin  and Flagyl ; plan for total of 10-day course - Repeat CT A/P performed 10/16/2023 showing unchanged appendix, 1.1 cm in caliber   Acute upper GI bleed Acute esophageal necrosis Acute blood loss anemia Patient has h/o PUD, gastric ulcer perforation s/p sx lap 07/2023. Now s/p EGD showing grade D esophagitis, black discoloration mucosa in esophagus concerning for acute esophageal necrosis, portal hypertensive gastropathy, non bleeding gastric ulcer with clean ulcer base with apparent sutures.  No bleeding duodenal ulcers with a clean ulcer base.  No evidence of active bleeding or varices.   Transferred to [facility] given findings concerning for acute esophageal necrosis Gastrin level wnl  GI recommending carafate  x14 days, BID PPI until follow up with GI primary at [facility].  Needs  ensure 2-3 times daily.     Abdominal Pain - improved  Suspect related to abdominal distension from ascites +/- possible appendicitis  Repeat CT scan as noted    Orthostatic Hypotension S/p 1 unit  pRBC midodrine  Follow orthostatics    Right Lower Extremity Edema Right Lower Extremity Pain LE US  - no evidence of deep vein thrombosis in lower extremity CT with moderate generalized subcutaneous edema throughout both visualized size, similar to recent pelvic CT (anasarca/third spacing vs cellulitis) She's on abx which would cover cellulitis, though lower suspicion for this Pain radiating all down leg, ? Neuropathic pain Continue gabapentin     Hypervolemic Hyponatremia fluctuating Appreciate renal assistance with volume and hyponatremia   Hypomagnesemia  Hypophosphatemia  Hypokalemia Follow, replace as needed   Tobacco abuse Continue nicotine  patch.   Alcohol abuse Continue thiamine , folate, MVT.   Severe Protein Calorie Malnutrition Body mass index is 28.95 kg/m RD  Interval History:  Resting in bed more somnolent and lethargic compared to yesterday.  Awakens easily to voice but falls back asleep.  Abdomen feels soft and less painful but still distended.   Old records reviewed in assessment of this patient  Antimicrobials:   DVT prophylaxis:  SCDs Start: 10/03/23 2153   Code Status:   Code Status: Full Code  Mobility Assessment (Last 72 Hours)     Mobility Assessment     Row Name 10/21/23 0800 10/20/23 2000 10/19/23 2000 10/19/23 1647 10/19/23 0954   Does the patient have exclusion criteria? No - Perform mobility assessment No - Perform mobility assessment No - Perform mobility assessment -- No - Perform mobility assessment   What is the highest level of mobility based on the mobility assessment? Level 1 (Bedfast) - Unable to balance while sitting on edge of bed Level 1 (Bedfast) - Unable to balance while sitting on edge of bed Level 1 (Bedfast) - Unable to balance while sitting on edge of bed Level 1 (Bedfast) - Unable to balance while sitting on edge of bed Level 1 (Bedfast) - Unable to balance while sitting on edge of bed   Is the above level different from  baseline mobility prior to current illness? Yes - Recommend PT order Yes - Recommend PT order Yes - Recommend PT order -- Yes - Recommend PT order    Row Name 10/18/23 2000           Does the patient have exclusion criteria? No - Perform mobility assessment       What is the highest level of mobility based on the mobility assessment? Level 2 (Chairfast) - Balance while sitting on edge of bed and cannot stand          Barriers to discharge:  Disposition Plan:  TBD HH orders placed: TBD Status is: Inpt  Objective: Blood pressure (!) 90/57, pulse 82, temperature 98 F (36.7 C), temperature source Oral, resp. rate 19, height 5' 2 (1.575 m), weight 58 kg, SpO2 100%.  Examination:  Physical Exam Constitutional:      Appearance: Normal appearance.  HENT:     Head: Normocephalic and atraumatic.     Mouth/Throat:     Mouth: Mucous membranes are moist.  Eyes:     Extraocular Movements: Extraocular movements intact.  Cardiovascular:     Rate and Rhythm: Normal rate and regular rhythm.  Pulmonary:     Effort: Pulmonary effort is normal. No respiratory distress.     Breath sounds: Normal breath sounds. No wheezing.  Abdominal:  General: Bowel sounds are normal. There is distension.     Palpations: Abdomen is soft.     Tenderness: There is abdominal tenderness (Slowly improved) in the right upper quadrant, right lower quadrant, left upper quadrant and left lower quadrant.     Comments: Persistent weeping of clear ascites fluid from right abdomen from prior paracentesis site  Musculoskeletal:        General: Normal range of motion.     Cervical back: Normal range of motion and neck supple.  Skin:    General: Skin is warm and dry.  Neurological:     Mental Status: She is alert.     Comments: No focal deficits.  Mild confusion appreciated along with mild asterixis  Psychiatric:        Mood and Affect: Mood normal.      Consultants:  GI, signed off 10/9 Nephrology, signed off  10/8  Procedures:    Data Reviewed: Results for orders placed or performed during the hospital encounter of 10/03/23 (from the past 24 hours)  Glucose, capillary     Status: Abnormal   Collection Time: 10/20/23  8:52 PM  Result Value Ref Range   Glucose-Capillary 122 (H) 70 - 99 mg/dL  Glucose, capillary     Status: Abnormal   Collection Time: 10/20/23 11:35 PM  Result Value Ref Range   Glucose-Capillary 111 (H) 70 - 99 mg/dL  Glucose, capillary     Status: Abnormal   Collection Time: 10/21/23  4:18 AM  Result Value Ref Range   Glucose-Capillary 105 (H) 70 - 99 mg/dL  Protime-INR     Status: Abnormal   Collection Time: 10/21/23  5:59 AM  Result Value Ref Range   Prothrombin Time 32.0 (H) 11.4 - 15.2 seconds   INR 2.9 (H) 0.8 - 1.2  CBC with Differential/Platelet     Status: Abnormal   Collection Time: 10/21/23  5:59 AM  Result Value Ref Range   WBC 10.5 4.0 - 10.5 K/uL   RBC 2.48 (L) 3.87 - 5.11 MIL/uL   Hemoglobin 8.0 (L) 12.0 - 15.0 g/dL   HCT 76.5 (L) 63.9 - 53.9 %   MCV 94.4 80.0 - 100.0 fL   MCH 32.3 26.0 - 34.0 pg   MCHC 34.2 30.0 - 36.0 g/dL   RDW 78.2 (H) 88.4 - 84.4 %   Platelets 101 (L) 150 - 400 K/uL   nRBC 0.0 0.0 - 0.2 %   Neutrophils Relative % 84 %   Neutro Abs 8.8 (H) 1.7 - 7.7 K/uL   Lymphocytes Relative 7 %   Lymphs Abs 0.7 0.7 - 4.0 K/uL   Monocytes Relative 8 %   Monocytes Absolute 0.8 0.1 - 1.0 K/uL   Eosinophils Relative 0 %   Eosinophils Absolute 0.0 0.0 - 0.5 K/uL   Basophils Relative 1 %   Basophils Absolute 0.1 0.0 - 0.1 K/uL   WBC Morphology See Note    Smear Review See Note    Polychromasia PRESENT   Comprehensive metabolic panel with GFR     Status: Abnormal   Collection Time: 10/21/23  5:59 AM  Result Value Ref Range   Sodium 125 (L) 135 - 145 mmol/L   Potassium 3.6 3.5 - 5.1 mmol/L   Chloride 93 (L) 98 - 111 mmol/L   CO2 22 22 - 32 mmol/L   Glucose, Bld 96 70 - 99 mg/dL   BUN 13 6 - 20 mg/dL   Creatinine, Ser 9.21 0.44 - 1.00  mg/dL  Calcium  9.3 8.9 - 10.3 mg/dL   Total Protein 5.8 (L) 6.5 - 8.1 g/dL   Albumin  3.7 3.5 - 5.0 g/dL   AST 56 (H) 15 - 41 U/L   ALT 13 0 - 44 U/L   Alkaline Phosphatase 59 38 - 126 U/L   Total Bilirubin 6.8 (H) 0.0 - 1.2 mg/dL   GFR, Estimated >39 >39 mL/min   Anion gap 10 5 - 15  Magnesium      Status: None   Collection Time: 10/21/23  5:59 AM  Result Value Ref Range   Magnesium  2.0 1.7 - 2.4 mg/dL  Glucose, capillary     Status: None   Collection Time: 10/21/23  7:39 AM  Result Value Ref Range   Glucose-Capillary 94 70 - 99 mg/dL   Comment 1 Notify RN   Glucose, capillary     Status: Abnormal   Collection Time: 10/21/23 11:56 AM  Result Value Ref Range   Glucose-Capillary 103 (H) 70 - 99 mg/dL   Comment 1 Notify RN     I have reviewed pertinent nursing notes, vitals, labs, and images as necessary. I have ordered labwork to follow up on as indicated.  I have reviewed the last notes from staff over past 24 hours. I have discussed patient's care plan and test results with nursing staff, CM/SW, and other staff as appropriate.  Time spent: Greater than 50% of the 55 minute visit was spent in counseling/coordination of care for the patient as laid out in the A&P.   LOS: 18 days   Alm Apo, MD [facility] 10/21/2023, 3:03 PM

## 2023-10-22 ENCOUNTER — Inpatient Hospital Stay (HOSPITAL_COMMUNITY)

## 2023-10-22 DIAGNOSIS — K7290 Hepatic failure, unspecified without coma: Secondary | ICD-10-CM | POA: Diagnosis not present

## 2023-10-22 DIAGNOSIS — K7460 Unspecified cirrhosis of liver: Secondary | ICD-10-CM | POA: Diagnosis not present

## 2023-10-22 HISTORY — PX: IR PARACENTESIS: IMG2679

## 2023-10-22 LAB — CBC WITH DIFFERENTIAL/PLATELET
Basophils Absolute: 0.2 K/uL — ABNORMAL HIGH (ref 0.0–0.1)
Basophils Relative: 2 %
Eosinophils Absolute: 0.1 K/uL (ref 0.0–0.5)
Eosinophils Relative: 1 %
HCT: 22.3 % — ABNORMAL LOW (ref 36.0–46.0)
Hemoglobin: 7.8 g/dL — ABNORMAL LOW (ref 12.0–15.0)
Lymphocytes Relative: 10 %
Lymphs Abs: 1.1 K/uL (ref 0.7–4.0)
MCH: 32.9 pg (ref 26.0–34.0)
MCHC: 35 g/dL (ref 30.0–36.0)
MCV: 94.1 fL (ref 80.0–100.0)
Monocytes Absolute: 0.6 K/uL (ref 0.1–1.0)
Monocytes Relative: 6 %
Neutro Abs: 8.7 K/uL — ABNORMAL HIGH (ref 1.7–7.7)
Neutrophils Relative %: 81 %
Platelets: 112 K/uL — ABNORMAL LOW (ref 150–400)
RBC: 2.37 MIL/uL — ABNORMAL LOW (ref 3.87–5.11)
RDW: 21.8 % — ABNORMAL HIGH (ref 11.5–15.5)
WBC: 10.8 K/uL — ABNORMAL HIGH (ref 4.0–10.5)
nRBC: 0 % (ref 0.0–0.2)

## 2023-10-22 LAB — GLUCOSE, CAPILLARY
Glucose-Capillary: 100 mg/dL — ABNORMAL HIGH (ref 70–99)
Glucose-Capillary: 106 mg/dL — ABNORMAL HIGH (ref 70–99)
Glucose-Capillary: 110 mg/dL — ABNORMAL HIGH (ref 70–99)
Glucose-Capillary: 112 mg/dL — ABNORMAL HIGH (ref 70–99)
Glucose-Capillary: 123 mg/dL — ABNORMAL HIGH (ref 70–99)
Glucose-Capillary: 94 mg/dL (ref 70–99)
Glucose-Capillary: 94 mg/dL (ref 70–99)

## 2023-10-22 LAB — BODY FLUID CELL COUNT WITH DIFFERENTIAL
Eos, Fluid: 0 %
Lymphs, Fluid: 29 %
Monocyte-Macrophage-Serous Fluid: 65 % (ref 50–90)
Neutrophil Count, Fluid: 6 % (ref 0–25)
Total Nucleated Cell Count, Fluid: 118 uL (ref 0–1000)

## 2023-10-22 LAB — COMPREHENSIVE METABOLIC PANEL WITH GFR
ALT: 11 U/L (ref 0–44)
AST: 64 U/L — ABNORMAL HIGH (ref 15–41)
Albumin: 3.2 g/dL — ABNORMAL LOW (ref 3.5–5.0)
Alkaline Phosphatase: 46 U/L (ref 38–126)
Anion gap: 10 (ref 5–15)
BUN: 17 mg/dL (ref 6–20)
CO2: 21 mmol/L — ABNORMAL LOW (ref 22–32)
Calcium: 8.9 mg/dL (ref 8.9–10.3)
Chloride: 91 mmol/L — ABNORMAL LOW (ref 98–111)
Creatinine, Ser: 0.87 mg/dL (ref 0.44–1.00)
GFR, Estimated: >60 mL/min (ref >60)
Glucose, Bld: 100 mg/dL — ABNORMAL HIGH (ref 70–99)
Potassium: 3.3 mmol/L — ABNORMAL LOW (ref 3.5–5.1)
Sodium: 122 mmol/L — ABNORMAL LOW (ref 135–145)
Total Bilirubin: 6.3 mg/dL — ABNORMAL HIGH (ref 0.0–1.2)
Total Protein: 5.6 g/dL — ABNORMAL LOW (ref 6.5–8.1)

## 2023-10-22 LAB — MAGNESIUM: Magnesium: 2 mg/dL (ref 1.7–2.4)

## 2023-10-22 LAB — PROTIME-INR
INR: 2.7 — ABNORMAL HIGH (ref 0.8–1.2)
Prothrombin Time: 30.2 s — ABNORMAL HIGH (ref 11.4–15.2)

## 2023-10-22 MED ORDER — POTASSIUM CHLORIDE CRYS ER 20 MEQ PO TBCR
40.0000 meq | EXTENDED_RELEASE_TABLET | ORAL | Status: AC
  Administered 2023-10-22 (×2): 40 meq via ORAL
  Filled 2023-10-22 (×2): qty 2

## 2023-10-22 MED ORDER — LIDOCAINE HCL 1 % IJ SOLN
20.0000 mL | Freq: Once | INTRAMUSCULAR | Status: DC
  Filled 2023-10-22: qty 20

## 2023-10-22 MED ORDER — LIDOCAINE HCL 1 % IJ SOLN
INTRAMUSCULAR | Status: AC
  Filled 2023-10-22: qty 20

## 2023-10-22 MED ORDER — SODIUM CHLORIDE 0.9 % IV SOLN: INTRAVENOUS | Status: DC | PRN

## 2023-10-22 MED ORDER — FUROSEMIDE 10 MG/ML IJ SOLN
40.0000 mg | Freq: Once | INTRAMUSCULAR | Status: AC
  Administered 2023-10-22: 40 mg via INTRAVENOUS
  Filled 2023-10-22: qty 4

## 2023-10-22 NOTE — Progress Notes (Signed)
 Made request for mattress replacement. Currently no air mattresses available.

## 2023-10-22 NOTE — Progress Notes (Signed)
 Occupational Therapy Treatment Patient Details Name: Joan Wood MRN: [id number] DOB: 08-30-1970 Today's Date: 10/22/2023   History of present illness 53 y.o. female presents to [facility] hospital on 10/03/2023 with nausea/vomiting and bloody diarrhea. Pt underwent upper EGD on 9/25, concerning for possible acute esophageal necrosis. Pt also underwent paracentesis on 9/25 and 10/6. PMH includes decompensated cirrhosis, PUD, GERD, pancreatitis, OSA.   OT comments  Pt progressing towards OT goals this session. Pt very eager to work with therapy. STM deficits and safety awareness deficits remain. Focused session on movement, strength, functional ADL while seated EOB and measuring orthostatic BPs (as seen below). Pt engaged in hair brushing (max A due to massive matt in back of head - really needs a shower cap and detangler) LB dressing, and functional transfers. Pt slightly impulsive today but in a motivated way. Reinforced need for post-acute rehab to patient and she was in agreement - and then MD came in and she denied agreement. This is a perfect example of her decreased ability to reason, STM deficits, and decreased safety awareness. OT will continue to follow acutely Continue to recommend post-acute rehab of <3 hours daily.   Blood Pressures: Supine: 86/57 (66) HR 86 Sitting EOB: 95/66 (76) HR 93 Sitting EOB 3 min: 101/60 (72) HR 98 Standing: 85/57 (66) HR 96 (unable to sustain standing due to symptoms so finalized in seated position) Supine at EOS: 93/56 (68) HR 88      If plan is discharge home, recommend the following:  Direct supervision/assist for medications management;Direct supervision/assist for financial management;Two people to help with walking and/or transfers;Two people to help with bathing/dressing/bathroom;Supervision due to cognitive status   Equipment Recommendations  BSC/3in1    Recommendations for Other Services      Precautions / Restrictions Precautions Precautions:  Fall Recall of Precautions/Restrictions: Impaired Precaution/Restrictions Comments: orthostatic Restrictions Weight Bearing Restrictions Per Provider Order: No       Mobility Bed Mobility Overal bed mobility: Needs Assistance Bed Mobility: Sidelying to Sit, Sit to Sidelying, Rolling Rolling: Contact guard assist (cues for sequencing) Sidelying to sit: Mod assist, Used rails (assist for BLE off the bed, pt pushing trunk up)     Sit to sidelying: Mod assist (for BLE back into bed) General bed mobility comments: multimodal cues for sequencing    Transfers Overall transfer level: Needs assistance Equipment used: Rolling walker (2 wheels), 1 person hand held assist Transfers: Sit to/from Stand Sit to Stand: Mod assist           General transfer comment: improved ability requiring mod +1 to come to standing EOB. Pt endorsing dizziness and light headedness     Balance Overall balance assessment: Needs assistance Sitting-balance support: No upper extremity supported, Feet supported, Bilateral upper extremity supported Sitting balance-Leahy Scale: Fair Sitting balance - Comments: CGA sitting EOB statically   Standing balance support: Bilateral upper extremity supported, Reliant on assistive device for balance, During functional activity Standing balance-Leahy Scale: Poor Standing balance comment: reliant on RW support                           ADL either performed or assessed with clinical judgement   ADL Overall ADL's : Needs assistance/impaired     Grooming: Maximal assistance;Sitting Grooming Details (indicate cue type and reason): attempted to brush hair. it is so matted, it was frugal. It really needs to be capped and use detangler on it  Upper Body Dressing : Moderate assistance;Sitting Upper Body Dressing Details (indicate cue type and reason): new gown                 Functional mobility during ADLs: Moderate assistance;Rolling walker  (2 wheels)      Extremity/Trunk Assessment Upper Extremity Assessment Upper Extremity Assessment: Generalized weakness            Vision   Vision Assessment?: Wears glasses for reading;Wears glasses for driving   Perception     Praxis     Communication Communication Communication: No apparent difficulties Factors Affecting Communication:  (verbose)   Cognition Arousal: Alert Behavior During Therapy: WFL for tasks assessed/performed (a little impulsive) Cognition: Cognition impaired     Awareness: Intellectual awareness impaired Memory impairment (select all impairments): Short-term memory, Working memory Attention impairment (select first level of impairment): Sustained attention Executive functioning impairment (select all impairments): Organization, Reasoning, Problem solving OT - Cognition Comments: STM deficits apparent, lack of safety awarenss,                 Following commands: Impaired Following commands impaired: Follows multi-step commands inconsistently      Cueing   Cueing Techniques: Verbal cues, Visual cues, Tactile cues  Exercises      Shoulder Instructions       General Comments      Pertinent Vitals/ Pain       Pain Assessment Pain Assessment: 0-10 Pain Score: 8  Faces Pain Scale: Hurts a little bit Pain Location: abdomen, back Pain Descriptors / Indicators: Aching, Discomfort Pain Intervention(s): Monitored during session, Repositioned  Home Living                                          Prior Functioning/Environment              Frequency  Min 2X/week        Progress Toward Goals  OT Goals(current goals can now be found in the care plan section)  Progress towards OT goals: Progressing toward goals  Acute Rehab OT Goals Patient Stated Goal: rehab OT Goal Formulation: With patient Time For Goal Achievement: 11/05/23 Potential to Achieve Goals: Fair  Plan      Co-evaluation                  AM-PAC OT 6 Clicks Daily Activity     Outcome Measure   Help from another person eating meals?: A Little Help from another person taking care of personal grooming?: A Little Help from another person toileting, which includes using toliet, bedpan, or urinal?: A Lot Help from another person bathing (including washing, rinsing, drying)?: A Lot Help from another person to put on and taking off regular upper body clothing?: A Lot Help from another person to put on and taking off regular lower body clothing?: Total 6 Click Score: 13    End of Session Equipment Utilized During Treatment: Gait belt;Rolling walker (2 wheels)  OT Visit Diagnosis: Other abnormalities of gait and mobility (R26.89);Muscle weakness (generalized) (M62.81);Pain;Other symptoms and signs involving cognitive function Pain - Right/Left: Right Pain - part of body:  (abdomen)   Activity Tolerance Patient tolerated treatment well;Other (comment) (impacted by BP)   Patient Left in bed;with call bell/phone within reach;with bed alarm set   Nurse Communication Mobility status        Time: 8870-8840 OT Time Calculation (min):  30 min  Charges: OT General Charges $OT Visit: 1 Visit OT Treatments $Self Care/Home Management : 8-22 mins $Therapeutic Activity: 8-22 mins  Leita DEL OTR/L Acute Rehabilitation Services Office: [phone]  Leita PARAS [facility] 10/22/2023, 1:54 PM

## 2023-10-22 NOTE — Progress Notes (Signed)
 Progress Note    Joan Wood   FMW:[id number]  DOB: 1970-04-11  DOA: 10/03/2023     19 PCP: Edman Meade PEDLAR, FNP  Initial CC: N/V/D  Hospital Course: Joan Wood is a 53 year old female with decompensated cirrhosis, PUD with recent gastric ulcer perforation s/p ex-lap, GERD, pancreatitis, OSA who presented to [facility] with N/V/bloody diarrhea x 4 days.    She had coffee ground emesis in the ED. CTA GI neg for acute bleed, large ascites and new distal esophageal thickening.    GI consulted for acute blood loss anemia with Hg 8.3>7. She was given PPI, started on octreotide  gtt, rocephin , vitamin K , pain and nausea control and admitted to hospitalist.    Now s/p upper endoscopy showing black discoloration of esophageal mucosa concerning for acute esophageal necrosis, portal hypertension, non-bleeding gastric and duodenal ulcers.    Transferred to [facility] ICU for further management.    Significant Events 9/24: ED for upper GI bleed admit to hospitalist  9/25: 1 U PRBC, GI upper endoscopy w/ acute esophageal necrosis with recommendation for transfer to [facility] > NPO, s/p paracentesis 3.35L drained Transferred to hospitalists service 9/27 GI signed off 9/30 10/1 US  without sufficient ascites to tap 10/3 CT with findings concerning for appendicitis, surgery recommending antibiotics (high risk for surgery).  GI reconsulted to to progressive liver failure.  10/4 diagnostic para without evidence SBP 10/5 diuretics held with doubling creatinine 10/6 para with 4 L off.  Renal consult for progressive AKI.   Assessment & Plan:   Decompensated Alcoholic Cirrhosis Hepatic Encephalopathy - improved Ascites  Bilateral Pleural Effusions  Hypoalbuminemia  Thrombocytopenia  Bili fluctuating/some downtrend, INR rose, now possibly stabilizing s/p Vit K doses - S/p 5 days ceftriaxone  (now on abx with appendicitis below) -Completed trial of NAC as well - s/p paracentesis as needed; last on 10/6, 4L out -  Still remains somewhat volume overloaded and undergoing treatment with Lasix as able] - Repeat paracentesis performed 10/22/2023, removed 3.3 L - suspect will need serial paracenteses - Ammonia level was elevated on 10/9 and lactulose increased; having significant liquid bowel movements she says; may need to consider rectal tube in efforts to keep ammonia level low -Repeat ammonia level on 10/11 still elevated at 78; continue lactulose - Palliative care consulted for GOC discussions as prognosis is very guarded and MELD-Na remains high at 35 (90-day mortality risk of approximately 52.6%) - Okay to place rectal tube; mentation waxing and waning, not wanting to decrease lactulose at this time   AKI - presumed ATN 2/2 contrast - not an HD candidate - seen by nephrology; signed off - Continue Foley for now but will perform TOV as able  Appendicitis - Appreciate gen surgery, she's high risk for surgery in setting of her cirrhosis - s/p Rocephin  and Flagyl ; plan for total of 10-day course; completed 10/13 - Repeat CT A/P performed 10/16/2023 showing unchanged appendix, 1.1 cm in caliber   Acute upper GI bleed Acute esophageal necrosis Acute blood loss anemia Patient has h/o PUD, gastric ulcer perforation s/p sx lap 07/2023. Now s/p EGD showing grade D esophagitis, black discoloration mucosa in esophagus concerning for acute esophageal necrosis, portal hypertensive gastropathy, non bleeding gastric ulcer with clean ulcer base with apparent sutures.  No bleeding duodenal ulcers with a clean ulcer base.  No evidence of active bleeding or varices.   Transferred to [facility] given findings concerning for acute esophageal necrosis Gastrin level wnl  GI recommending carafate  x14 days, BID PPI  until follow up with GI primary at [facility]   Abdominal Pain - improved  Suspect related to abdominal distension from ascites +/- possible appendicitis  Repeat CT scan as noted    Orthostatic Hypotension S/p 1  unit pRBC midodrine    Right Lower Extremity Edema Right Lower Extremity Pain LE US  - no evidence of deep vein thrombosis in lower extremity CT with moderate generalized subcutaneous edema throughout both visualized size, similar to recent pelvic CT (anasarca/third spacing vs cellulitis) She's on abx which would cover cellulitis, though lower suspicion for this Pain radiating all down leg, ? Neuropathic pain Continue gabapentin     Hypervolemic Hyponatremia fluctuating Appreciate renal assistance with volume and hyponatremia   Hypomagnesemia  Hypophosphatemia  Hypokalemia Follow, replace as needed   Tobacco abuse Continue nicotine  patch.   Alcohol abuse Continue thiamine , folate, MVT.   Severe Protein Calorie Malnutrition Body mass index is 28.95 kg/m RD  Interval History:  More awake today.  Abdominal pain slowly improving but still present. Still incontinent of stool in bed.  Getting paracentesis today. She was hesitant on considering rehab but after further discussion, she understands the reasoning.  I also reexplained to her that her mortality is still high regardless and surviving to go to rehab will be an accomplishment.   Old records reviewed in assessment of this patient  Antimicrobials:   DVT prophylaxis:  SCDs Start: 10/03/23 2153   Code Status:   Code Status: Full Code  Mobility Assessment (Last 72 Hours)     Mobility Assessment     Row Name 10/22/23 1300 10/22/23 0800 10/21/23 1936 10/21/23 1600 10/21/23 1200   Does the patient have exclusion criteria? -- No - Perform mobility assessment No - Perform mobility assessment No - Perform mobility assessment No - Perform mobility assessment   What is the highest level of mobility based on the mobility assessment? Level 2 (Chairfast) - Balance while sitting on edge of bed and cannot stand Level 1 (Bedfast) - Unable to balance while sitting on edge of bed Level 1 (Bedfast) - Unable to balance while sitting on  edge of bed Level 1 (Bedfast) - Unable to balance while sitting on edge of bed Level 1 (Bedfast) - Unable to balance while sitting on edge of bed   Is the above level different from baseline mobility prior to current illness? -- -- Yes - Recommend PT order Yes - Recommend PT order Yes - Recommend PT order    Row Name 10/21/23 0800 10/20/23 2000 10/19/23 2000 10/19/23 1647     Does the patient have exclusion criteria? No - Perform mobility assessment No - Perform mobility assessment No - Perform mobility assessment --    What is the highest level of mobility based on the mobility assessment? Level 1 (Bedfast) - Unable to balance while sitting on edge of bed Level 1 (Bedfast) - Unable to balance while sitting on edge of bed Level 1 (Bedfast) - Unable to balance while sitting on edge of bed Level 1 (Bedfast) - Unable to balance while sitting on edge of bed    Is the above level different from baseline mobility prior to current illness? Yes - Recommend PT order Yes - Recommend PT order Yes - Recommend PT order --       Barriers to discharge:  Disposition Plan:  ?SNF if doesn't decline further  HH orders placed: n/a Status is: Inpt  Objective: Blood pressure (!) 98/59, pulse 88, temperature 98.3 F (36.8 C), temperature source Oral, resp.  rate 15, height 5' 2 (1.575 m), weight 58 kg, SpO2 100%.  Examination:  Physical Exam Constitutional:      Appearance: Normal appearance.  HENT:     Head: Normocephalic and atraumatic.     Mouth/Throat:     Mouth: Mucous membranes are moist.  Eyes:     Extraocular Movements: Extraocular movements intact.  Cardiovascular:     Rate and Rhythm: Normal rate and regular rhythm.  Pulmonary:     Effort: Pulmonary effort is normal. No respiratory distress.     Breath sounds: Normal breath sounds. No wheezing.  Abdominal:     General: Bowel sounds are normal. There is distension.     Palpations: Abdomen is soft.     Tenderness: There is abdominal tenderness  (Slowly improved) in the right upper quadrant, right lower quadrant, left upper quadrant and left lower quadrant.  Musculoskeletal:        General: Normal range of motion.     Cervical back: Normal range of motion and neck supple.  Skin:    General: Skin is warm and dry.  Neurological:     Mental Status: She is alert.     Comments: No focal deficits.  Mild confusion appreciated along with mild asterixis  Psychiatric:        Mood and Affect: Mood normal.      Consultants:  GI, signed off 10/9 Nephrology, signed off 10/8  Procedures:    Data Reviewed: Results for orders placed or performed during the hospital encounter of 10/03/23 (from the past 24 hours)  Glucose, capillary     Status: None   Collection Time: 10/21/23  4:12 PM  Result Value Ref Range   Glucose-Capillary 92 70 - 99 mg/dL   Comment 1 Notify RN   Glucose, capillary     Status: Abnormal   Collection Time: 10/21/23  7:55 PM  Result Value Ref Range   Glucose-Capillary 134 (H) 70 - 99 mg/dL  Glucose, capillary     Status: Abnormal   Collection Time: 10/22/23 12:13 AM  Result Value Ref Range   Glucose-Capillary 110 (H) 70 - 99 mg/dL  Protime-INR     Status: Abnormal   Collection Time: 10/22/23  1:50 AM  Result Value Ref Range   Prothrombin Time 30.2 (H) 11.4 - 15.2 seconds   INR 2.7 (H) 0.8 - 1.2  CBC with Differential/Platelet     Status: Abnormal   Collection Time: 10/22/23  1:50 AM  Result Value Ref Range   WBC 10.8 (H) 4.0 - 10.5 K/uL   RBC 2.37 (L) 3.87 - 5.11 MIL/uL   Hemoglobin 7.8 (L) 12.0 - 15.0 g/dL   HCT 77.6 (L) 63.9 - 53.9 %   MCV 94.1 80.0 - 100.0 fL   MCH 32.9 26.0 - 34.0 pg   MCHC 35.0 30.0 - 36.0 g/dL   RDW 78.1 (H) 88.4 - 84.4 %   Platelets 112 (L) 150 - 400 K/uL   nRBC 0.0 0.0 - 0.2 %   Neutrophils Relative % 81 %   Neutro Abs 8.7 (H) 1.7 - 7.7 K/uL   Lymphocytes Relative 10 %   Lymphs Abs 1.1 0.7 - 4.0 K/uL   Monocytes Relative 6 %   Monocytes Absolute 0.6 0.1 - 1.0 K/uL    Eosinophils Relative 1 %   Eosinophils Absolute 0.1 0.0 - 0.5 K/uL   Basophils Relative 2 %   Basophils Absolute 0.2 (H) 0.0 - 0.1 K/uL   WBC Morphology See Note  Smear Review See Note    Polychromasia PRESENT    Target Cells PRESENT   Comprehensive metabolic panel with GFR     Status: Abnormal   Collection Time: 10/22/23  1:50 AM  Result Value Ref Range   Sodium 122 (L) 135 - 145 mmol/L   Potassium 3.3 (L) 3.5 - 5.1 mmol/L   Chloride 91 (L) 98 - 111 mmol/L   CO2 21 (L) 22 - 32 mmol/L   Glucose, Bld 100 (H) 70 - 99 mg/dL   BUN 17 6 - 20 mg/dL   Creatinine, Ser 9.12 0.44 - 1.00 mg/dL   Calcium  8.9 8.9 - 10.3 mg/dL   Total Protein 5.6 (L) 6.5 - 8.1 g/dL   Albumin  3.2 (L) 3.5 - 5.0 g/dL   AST 64 (H) 15 - 41 U/L   ALT 11 0 - 44 U/L   Alkaline Phosphatase 46 38 - 126 U/L   Total Bilirubin 6.3 (H) 0.0 - 1.2 mg/dL   GFR, Estimated >39 >39 mL/min   Anion gap 10 5 - 15  Magnesium      Status: None   Collection Time: 10/22/23  1:50 AM  Result Value Ref Range   Magnesium  2.0 1.7 - 2.4 mg/dL  Glucose, capillary     Status: None   Collection Time: 10/22/23  4:16 AM  Result Value Ref Range   Glucose-Capillary 94 70 - 99 mg/dL  Glucose, capillary     Status: None   Collection Time: 10/22/23  8:28 AM  Result Value Ref Range   Glucose-Capillary 94 70 - 99 mg/dL  Glucose, capillary     Status: Abnormal   Collection Time: 10/22/23 12:57 PM  Result Value Ref Range   Glucose-Capillary 100 (H) 70 - 99 mg/dL    I have reviewed pertinent nursing notes, vitals, labs, and images as necessary. I have ordered labwork to follow up on as indicated.  I have reviewed the last notes from staff over past 24 hours. I have discussed patient's care plan and test results with nursing staff, CM/SW, and other staff as appropriate.  Time spent: Greater than 50% of the 55 minute visit was spent in counseling/coordination of care for the patient as laid out in the A&P.   LOS: 19 days   Alm Apo,  MD [facility] 10/22/2023, 3:45 PM

## 2023-10-22 NOTE — Progress Notes (Signed)
 After the patients paracentesis, the catheter was removed and a bandage was placed on the patients right side. After the bandage was placed, her BP was obtained and was documented as 83/56 (66). The patient stated that she was fine and she was ready to go back to her room. I let the performing NP know in IR and I called her nurse to let her know that her BP was low in IR and that she needed to be monitored to make sure that it came back up. All questions were encouraged and answered.  Harlene Lesches RT(R).

## 2023-10-22 NOTE — Plan of Care (Signed)

## 2023-10-22 NOTE — Procedures (Signed)
 PROCEDURE SUMMARY:  Successful image-guided paracentesis from the right abdomen.  Yielded 3.3 liters of clear yellow fluid.  No immediate complications.  EBL: zero Patient tolerated well.   Specimen sent for labs.  Please see imaging section of Epic for full dictation.  Post procedure BP 83/56, MAP 66. Pt asymptomatic. Post procedure imaging unremarkable. Upon return to unit, documented BP 98/59 MAP 71.    Barbera Perritt B Carlyne Keehan NP 10/22/2023 3:13 PM

## 2023-10-22 NOTE — TOC Progression Note (Signed)
 Transition of Care [facility]) - Progression Note    Patient Details  Name: [name] MRN: [id number] Date of Birth: 13-Nov-1970  [facility] CM/SW Contact  Andrez JULIANNA George, RN Phone Number: 10/22/2023, 11:34 AM  Clinical Narrative:     Continued medical treatments.  IP Care management following.  Expected Discharge Plan: OP Rehab Barriers to Discharge: Continued Medical Work up               Expected Discharge Plan and Services In-house Referral: Clinical Social Work Discharge Planning Services: CM Consult Post Acute Care Choice: Home Health Living arrangements for the past 2 months: Single Family Home                 DME Arranged: Walker rolling   Date DME Agency Contacted: 10/08/23   Representative spoke with at DME Agency: [name]             Social Drivers of Health (SDOH) Interventions SDOH Screenings   Food Insecurity: No Food Insecurity (10/03/2023)  Housing: Low Risk  (10/03/2023)  Transportation Needs: No Transportation Needs (10/03/2023)  Recent Concern: Transportation Needs - Unmet Transportation Needs (08/20/2023)  Utilities: Not At Risk (10/03/2023)  Depression (PHQ2-9): High Risk (07/30/2023)  Social Connections: Socially Isolated (10/03/2023)  Tobacco Use: High Risk (10/04/2023)    Readmission Risk Interventions    10/04/2023    9:02 AM 09/03/2023   12:31 PM 09/02/2023    9:01 AM  Readmission Risk Prevention Plan  Transportation Screening Complete Complete Complete  PCP or Specialist Appt within 3-5 Days   Complete  HRI or Home Care Consult   Complete  Social Work Consult for Recovery Care Planning/Counseling   Complete  Palliative Care Screening   Not Applicable  Medication Review Oceanographer) Complete Complete Complete  HRI or Home Care Consult Complete Complete   SW Recovery Care/Counseling Consult Complete Complete   Palliative Care Screening Not Applicable Not Applicable   Skilled Nursing Facility Not Applicable  Patient Refused

## 2023-10-23 DIAGNOSIS — K922 Gastrointestinal hemorrhage, unspecified: Secondary | ICD-10-CM | POA: Diagnosis not present

## 2023-10-23 DIAGNOSIS — K7460 Unspecified cirrhosis of liver: Secondary | ICD-10-CM | POA: Diagnosis not present

## 2023-10-23 DIAGNOSIS — Z515 Encounter for palliative care: Secondary | ICD-10-CM | POA: Diagnosis not present

## 2023-10-23 DIAGNOSIS — K3580 Unspecified acute appendicitis: Secondary | ICD-10-CM | POA: Diagnosis not present

## 2023-10-23 DIAGNOSIS — K7290 Hepatic failure, unspecified without coma: Secondary | ICD-10-CM | POA: Diagnosis not present

## 2023-10-23 LAB — GLUCOSE, CAPILLARY
Glucose-Capillary: 108 mg/dL — ABNORMAL HIGH (ref 70–99)
Glucose-Capillary: 115 mg/dL — ABNORMAL HIGH (ref 70–99)
Glucose-Capillary: 117 mg/dL — ABNORMAL HIGH (ref 70–99)
Glucose-Capillary: 117 mg/dL — ABNORMAL HIGH (ref 70–99)
Glucose-Capillary: 117 mg/dL — ABNORMAL HIGH (ref 70–99)
Glucose-Capillary: 124 mg/dL — ABNORMAL HIGH (ref 70–99)
Glucose-Capillary: 94 mg/dL (ref 70–99)

## 2023-10-23 LAB — CBC WITH DIFFERENTIAL/PLATELET
Abs Immature Granulocytes: 0.23 K/uL — ABNORMAL HIGH (ref 0.00–0.07)
Basophils Absolute: 0.1 K/uL (ref 0.0–0.1)
Basophils Relative: 0 %
Eosinophils Absolute: 0.1 K/uL (ref 0.0–0.5)
Eosinophils Relative: 0 %
HCT: 22.8 % — ABNORMAL LOW (ref 36.0–46.0)
Hemoglobin: 8 g/dL — ABNORMAL LOW (ref 12.0–15.0)
Immature Granulocytes: 2 %
Lymphocytes Relative: 10 %
Lymphs Abs: 1.4 K/uL (ref 0.7–4.0)
MCH: 33.1 pg (ref 26.0–34.0)
MCHC: 35.1 g/dL (ref 30.0–36.0)
MCV: 94.2 fL (ref 80.0–100.0)
Monocytes Absolute: 2.3 K/uL — ABNORMAL HIGH (ref 0.1–1.0)
Monocytes Relative: 16 %
Neutro Abs: 9.8 K/uL — ABNORMAL HIGH (ref 1.7–7.7)
Neutrophils Relative %: 72 %
Platelets: 143 K/uL — ABNORMAL LOW (ref 150–400)
RBC: 2.42 MIL/uL — ABNORMAL LOW (ref 3.87–5.11)
RDW: 21.4 % — ABNORMAL HIGH (ref 11.5–15.5)
Smear Review: NORMAL
WBC: 13.8 K/uL — ABNORMAL HIGH (ref 4.0–10.5)
nRBC: 0 % (ref 0.0–0.2)

## 2023-10-23 LAB — COMPREHENSIVE METABOLIC PANEL WITH GFR
ALT: 13 U/L (ref 0–44)
AST: 68 U/L — ABNORMAL HIGH (ref 15–41)
Albumin: 3.4 g/dL — ABNORMAL LOW (ref 3.5–5.0)
Alkaline Phosphatase: 57 U/L (ref 38–126)
Anion gap: 11 (ref 5–15)
BUN: 20 mg/dL (ref 6–20)
CO2: 21 mmol/L — ABNORMAL LOW (ref 22–32)
Calcium: 9.3 mg/dL (ref 8.9–10.3)
Chloride: 93 mmol/L — ABNORMAL LOW (ref 98–111)
Creatinine, Ser: 1.01 mg/dL — ABNORMAL HIGH (ref 0.44–1.00)
GFR, Estimated: >60 mL/min (ref >60)
Glucose, Bld: 120 mg/dL — ABNORMAL HIGH (ref 70–99)
Potassium: 4.5 mmol/L (ref 3.5–5.1)
Sodium: 125 mmol/L — ABNORMAL LOW (ref 135–145)
Total Bilirubin: 6.1 mg/dL — ABNORMAL HIGH (ref 0.0–1.2)
Total Protein: 6.1 g/dL — ABNORMAL LOW (ref 6.5–8.1)

## 2023-10-23 LAB — MAGNESIUM: Magnesium: 2.1 mg/dL (ref 1.7–2.4)

## 2023-10-23 LAB — PATHOLOGIST SMEAR REVIEW

## 2023-10-23 LAB — PROTIME-INR
INR: 2.6 — ABNORMAL HIGH (ref 0.8–1.2)
Prothrombin Time: 29.4 s — ABNORMAL HIGH (ref 11.4–15.2)

## 2023-10-23 MED ORDER — JUVEN PO PACK
1.0000 | PACK | Freq: Two times a day (BID) | ORAL | Status: DC
  Administered 2023-10-23 – 2024-02-12 (×190): 1 via ORAL
  Filled 2023-10-23 (×181): qty 1

## 2023-10-23 MED ORDER — ALBUMIN HUMAN 25 % IV SOLN
25.0000 g | Freq: Once | INTRAVENOUS | Status: AC
  Administered 2023-10-23: 25 g via INTRAVENOUS
  Filled 2023-10-23: qty 100

## 2023-10-23 NOTE — Progress Notes (Signed)
 Palliative Medicine Inpatient Follow Up Note   HPI: 53 y.o. female  with past medical history of  s/p exploratory laparotomy for perforated gastric ulcer repair with omental patch on 7/27, ongoing alcohol use disorder, tobacco abuse, cirrhosis, HTN, GERD, anxiety/depression, multiple hospitalizations,  admitted on 10/03/2023 at [facility] with complaints of nausea, vomiting and coffee-ground emesis, symptoms ongoing for last 2 days, she reports last alcoholic beverage she had Monday or Sunday, for nausea, vomiting and diarrhea has been ongoing for last 2 days, and it turned today into coffee-ground emesis. Later (10/04/2023) transferred to [facility] for further management. Now status post upper endoscopy, showing black discoloration of esophageal mucosa concerning for acute esophageal necrosis, portal hypertension, non-bleeding gastric and duodenal ulcers.    Paracentesis on 10/15/2023: 4.0 liters of ascitic fluid removed. Paracentesis on 10/22/2023: 3.3 liters of ascitic fluid removed.   Worth to note that this is her 7 inpatient admissions, and has 4 ED encounter. Her most recent hospital stay was in August 2025 due to GI bleed and abdominal pain.    PMT has been consulted to assist with goals of care conversation. Patient/Family face treatment option decisions, advanced directive decisions and anticipatory care needs.    Family face treatment option decision, advance directive decisions and anticipatory care needs.    Today's Discussion 10/23/2023  *Please note that this is a verbal dictation therefore any spelling or grammatical errors are due to the Dragon Medical One system interpretation.  Chart reviewed inclusive of vital signs, progress notes, laboratory results, and diagnostic images.  Ammonia level increasing from 62 last 10/15/23 to 78 10/20/2023.   I visited the patient at bedside today. No family members were present during the encounter. The patient was alert but appeared confused. She  was able to express her needs but was irritable and unwilling to engage in conversation. She reported having had a rough night, primarily due to multiple episodes of diarrhea, which she described as very uncomfortable. She also mentioned undergoing a paracentesis yesterday. She also reported a poor appetite.  Following the visit, I contacted her friend, Joan Wood, to provide an update. I informed him that the patient appeared more confused today compared to previous days. We discussed the importance of identifying a decision-maker, especially if the patient becomes unable to make decisions for herself. Although the patient previously identified her eldest daughter, Joan Wood, as her preferred decision-maker, she declined to share Joan Wood's contact information and refused to allow her children to be informed of her current medical condition.  Joan Wood described the patient as a "stubborn individual" who tends to follow her own path and stands firmly by her decisions. He expressed uncertainty about how to approach her regarding this matter. I explained to Joan Wood that the patient's medical condition, decompensated alcoholic cirrhosis is unlikely to significantly improve and that her episodes of confusion may persist or worsen over time.  Patient face treatment option decisions, advanced directive decisions and anticipatory care needs.   Questions and concerns addressed   Palliative Support Provided.   Objective Assessment: Vital Signs Vitals:   10/23/23 0028 10/23/23 0408  BP: (!) 105/56 (!) 104/50  Pulse: (!) 105 (!) 105  Resp: 19 19  Temp: 98.8 F (37.1 C) 99.2 F (37.3 C)  SpO2: 96% 95%    Intake/Output Summary (Last 24 hours) at 10/23/2023 0811 Last data filed at 10/22/2023 1637 Gross per 24 hour  Intake 122.82 ml  Output 650 ml  Net -527.18 ml   Last Weight  Most recent update:  10/21/2023  5:48 AM    Weight  58 kg (127 lb 12.8 oz)                SUMMARY OF RECOMMENDATIONS   Code  Status: Maintain Full Code Continue current scope of care Goal of care is medical stabilization and recovery to the extent this is possible The patient was encouraged to initiate open communication with her children regarding her current medical condition, including both her short- and long-term GOC. Spiritual care consult to assist with AD creation (Consult placed 10/20/2023) Continue to provide psycho-social and emotional support to patient and family Palliative medicine team will continue to follow.   Symptom Management: Per Primary team Palliative medicine is available to assist as needed.      Time Spent: 50 minutes  Detailed review of medical records (labs, imaging, vital signs), medically appropriate exam, discussed with treatment team, counseling and education to patient, family, & staff, documenting clinical information, coordination of care.   ______________________________________________________________________________________ Joan Bolder NP-C [facility]iative [facility] Team Team Cell Phone: [phone] Please utilize secure chat with additional questions, if there is no response within 30 minutes please call the above phone number  Palliative Medicine Team providers are available by phone from 7am to 7pm daily and can be reached through the team cell phone.  Should this patient require assistance outside of these hours, please call the patient's attending physician.

## 2023-10-23 NOTE — Progress Notes (Signed)
 Nutrition Follow-up  DOCUMENTATION CODES:   Severe malnutrition in context of chronic illness  INTERVENTION:  Continue current diet as ordered Encourage PO intake Continue ordering assistance Continue Ensure Plus High Protein po TID, each supplement provides 350 kcal and 20 grams of protein. Add -1 packet Juven BID, each packet provides 95 calories, 2.5 grams of protein (collagen),  to support wound healing Continue MVI with minerals daily, thiamine  and folic acid  for alcohol use history Remove Magic cups d/t patients preference   NUTRITION DIAGNOSIS:   Severe Malnutrition related to chronic illness (cirrhosis) as evidenced by severe fat depletion, severe muscle depletion. - Ongoing   GOAL:   Patient will meet greater than or equal to 90% of their needs - Progressing   MONITOR:   PO intake, Supplement acceptance, Labs, I & O's  REASON FOR ASSESSMENT:   Consult (low BMI) Assessment of nutrition requirement/status  ASSESSMENT:  Pt with hx of cirrhosis, perforated gastric ulcers in July s/p repair, recent GIB, h/o pancreatitis, alcohol use disorder, and GERD presented to [facility] ED with N/V/D and poor PO intake. Workup concerning for GI bleed  9/24 - presented to [facility] ED 9/25 - EGD showed necrotic appearing severe esophagitis in the mid and distal esophagus. Severe portal gastropathy. One 2 cm gastric ulcer and multiple superficial duodenal ulcers with clean bases, transferred to [facility] MICU, paracentesis removed 10/4 - diagnostic paracentesis of bright yellow fluid removed 10/6 - paracentesis 4L removed 10/13 - Paracentesis 3.3L removed    Pt resting in bed at the time of assessment, continues to be lethargic. Pt was going in and out of sleep on visit. Pt reports having multiple loose BM, likely from Lactulose for high ammonia levels. On exam, pt's abdomen distended and pt reports of abdominal pain. Pt's appetite has been fluctuating since admission,  pt did not have an appetite this morning and did not eat breakfast but reports eating 50-100% of her meals in the last couple of days. Enjoys Ensures and is drinking x2/day. Does not like the Magic cups will remove and add Juven for new stage 2 pressure injury on sacrum. Pt agreeable.  Noted that therapy team recommending SNF but pt refusing, wants to go home. Now accepting to go to rehab. Will continue to follow and promote interventions to help pt meet her nutrition needs. Encouraged small frequent meals if pt is experiencing early satiety from fluid buildup.   Admit weight: 56.7 kg Current weight: 58 kg   Average Meal Intake: 9/27-9/29: 65% intake x 3 recorded meals 10/2-10/5: 22% intake x 3 recorded meals  Nutritionally Relevant Medications: Scheduled Meds:  calcium  carbonate  1 tablet Oral TID WC   feeding supplement  237 mL Oral TID BM   folic acid   1 mg Oral Daily   gabapentin   300 mg Oral TID   lactulose  30 g Oral TID   multivitamin with minerals  1 tablet Oral Daily   nutrition supplement (JUVEN)  1 packet Oral BID BM   thiamine   100 mg Oral Daily   Labs Reviewed: Sodium 125 Creatinine 1.01 AST 68 Total Bilirubin 6.1 Ammonia 78 CBG ranges from 94-124 mg/dL over the last 24 hours HgbA1c 4.5  Diet Order:   Diet Order             Diet 2 gram sodium Room service appropriate? Yes with Assist; Fluid consistency: Thin; Fluid restriction: 2000 mL Fluid  Diet effective now  EDUCATION NEEDS:   Education needs have been addressed  Skin:  Skin Assessment: Skin Integrity Issues: Skin Integrity Issues:: Stage II Stage II: Sacrum  Last BM:  10/14, type 7  Height:   Ht Readings from Last 1 Encounters:  10/04/23 5' 2 (1.575 m)    Weight:   Wt Readings from Last 1 Encounters:  10/21/23 58 kg    Ideal Body Weight:  50 kg  BMI:  Body mass index is 23.37 kg/m.  Estimated Nutritional Needs:   Kcal:  1600-1800 kcal/d  Protein:   85-100g/d  Fluid:  1.5L/d   Olivia Kenning, RD Registered Dietitian  See Amion for more information

## 2023-10-23 NOTE — Progress Notes (Signed)
 Progress Note    Joan Wood   FMW:[id number]  DOB: 1970-02-04  DOA: 10/03/2023     20 PCP: Edman Meade PEDLAR, FNP  Initial CC: N/V/D  Hospital Course: Leonora Gores is a 53 year old female with decompensated cirrhosis, PUD with recent gastric ulcer perforation s/p ex-lap, GERD, pancreatitis, OSA who presented to [facility] with N/V/bloody diarrhea x 4 days.    She had coffee ground emesis in the ED. CTA GI neg for acute bleed, large ascites and new distal esophageal thickening.    GI consulted for acute blood loss anemia with Hg 8.3>7. She was given PPI, started on octreotide  gtt, rocephin , vitamin K , pain and nausea control and admitted to hospitalist.    Now s/p upper endoscopy showing black discoloration of esophageal mucosa concerning for acute esophageal necrosis, portal hypertension, non-bleeding gastric and duodenal ulcers.    Transferred to [facility] ICU for further management.    Significant Events 9/24: ED for upper GI bleed admit to hospitalist  9/25: 1 U PRBC, GI upper endoscopy w/ acute esophageal necrosis with recommendation for transfer to [facility] > NPO, s/p paracentesis 3.35L drained Transferred to hospitalists service 9/27 GI signed off 9/30 10/1 US  without sufficient ascites to tap 10/3 CT with findings concerning for appendicitis, surgery recommending antibiotics (high risk for surgery).  GI reconsulted to to progressive liver failure.  10/4 diagnostic para without evidence SBP 10/5 diuretics held with doubling creatinine 10/6 para with 4 L off.  Renal consult for progressive AKI.   Assessment & Plan:   Decompensated Alcoholic Cirrhosis Hepatic Encephalopathy - improved Ascites  Bilateral Pleural Effusions  Hypoalbuminemia  Thrombocytopenia  Bili fluctuating/some downtrend, INR rose, now possibly stabilizing s/p Vit K doses - S/p 5 days ceftriaxone  (now on abx with appendicitis below) -Completed trial of NAC as well - s/p paracentesis as needed; last on 10/6, 4L out -  Still remains somewhat volume overloaded and undergoing treatment with Lasix as able] - Repeat paracentesis performed 10/22/2023, removed 3.3 L - suspect will need serial paracenteses - Ammonia level was elevated on 10/9 and lactulose increased -Repeat ammonia level on 10/11 still elevated at 78; continue lactulose. Rectal tube placed 10/13. Concern about worsening encephalopathy with decreased lactulose dose - Palliative care consulted for GOC discussions as prognosis is very guarded and MELD-Na remains high at 35 (90-day mortality risk of approximately 52.6%) - prognosis remains poor; appreciate close involvement of palliative care. Need to keep watching over next several days to see if remains stable (albeit rectal tube/foley in place) enough for SNF vs transition to hospice if further decompensation   AKI - presumed ATN 2/2 contrast - not an HD candidate - seen by nephrology; signed off - Continue Foley for now but will eventually need TOV  Appendicitis - Appreciate gen surgery, she's high risk for surgery in setting of her cirrhosis - s/p Rocephin  and Flagyl ; plan for total of 10-day course; completed 10/13 - Repeat CT A/P performed 10/16/2023 showing unchanged appendix, 1.1 cm in caliber   Acute upper GI bleed Acute esophageal necrosis Acute blood loss anemia Patient has h/o PUD, gastric ulcer perforation s/p sx lap 07/2023. Now s/p EGD showing grade D esophagitis, black discoloration mucosa in esophagus concerning for acute esophageal necrosis, portal hypertensive gastropathy, non bleeding gastric ulcer with clean ulcer base with apparent sutures.  No bleeding duodenal ulcers with a clean ulcer base.  No evidence of active bleeding or varices.   Transferred to [facility] given findings concerning for acute esophageal necrosis  Gastrin level wnl  GI recommending carafate  x14 days, BID PPI until follow up with GI primary at [facility]   Abdominal Pain - improved  Suspect related to  abdominal distension from ascites +/- possible appendicitis  Repeat CT scan as noted    Orthostatic Hypotension S/p 1 unit pRBC midodrine    Right Lower Extremity Edema Right Lower Extremity Pain LE US  - no evidence of deep vein thrombosis in lower extremity CT with moderate generalized subcutaneous edema throughout both visualized size, similar to recent pelvic CT (anasarca/third spacing vs cellulitis) She's on abx which would cover cellulitis, though lower suspicion for this Pain radiating all down leg, ? Neuropathic pain Continue gabapentin     Hypervolemic Hyponatremia fluctuating Appreciate renal assistance with volume and hyponatremia   Hypomagnesemia  Hypophosphatemia  Hypokalemia Follow, replace as needed   Tobacco abuse Continue nicotine  patch.   Alcohol abuse Continue thiamine , folate, MVT.   Severe Protein Calorie Malnutrition Body mass index is 28.95 kg/m RD  Interval History:   Awake but still episodes of confusion.  Has a hard time remembering conversations.  Abdominal pain improved some but still a little tender.  Trying to eat some.  Rectal tube placed yesterday and still having liquid stool noted.   Old records reviewed in assessment of this patient  Antimicrobials:   DVT prophylaxis:  SCDs Start: 10/03/23 2153   Code Status:   Code Status: Full Code  Mobility Assessment (Last 72 Hours)     Mobility Assessment     Row Name 10/23/23 0731 10/22/23 2300 10/22/23 1300 10/22/23 0800 10/21/23 1936   Does the patient have exclusion criteria? No - Perform mobility assessment No - Perform mobility assessment -- No - Perform mobility assessment No - Perform mobility assessment   What is the highest level of mobility based on the mobility assessment? Level 2 (Chairfast) - Balance while sitting on edge of bed and cannot stand Level 2 (Chairfast) - Balance while sitting on edge of bed and cannot stand Level 2 (Chairfast) - Balance while sitting on edge of  bed and cannot stand Level 1 (Bedfast) - Unable to balance while sitting on edge of bed Level 1 (Bedfast) - Unable to balance while sitting on edge of bed   Is the above level different from baseline mobility prior to current illness? Yes - Recommend PT order Yes - Recommend PT order -- -- Yes - Recommend PT order    Row Name 10/21/23 1600 10/21/23 1200 10/21/23 0800 10/20/23 2000     Does the patient have exclusion criteria? No - Perform mobility assessment No - Perform mobility assessment No - Perform mobility assessment No - Perform mobility assessment    What is the highest level of mobility based on the mobility assessment? Level 1 (Bedfast) - Unable to balance while sitting on edge of bed Level 1 (Bedfast) - Unable to balance while sitting on edge of bed Level 1 (Bedfast) - Unable to balance while sitting on edge of bed Level 1 (Bedfast) - Unable to balance while sitting on edge of bed    Is the above level different from baseline mobility prior to current illness? Yes - Recommend PT order Yes - Recommend PT order Yes - Recommend PT order Yes - Recommend PT order       Barriers to discharge:  Disposition Plan:  ?SNF if doesn't decline further  HH orders placed: n/a Status is: Inpt  Objective: Blood pressure 102/61, pulse 87, temperature 98.4 F (36.9 C), temperature source  Axillary, resp. rate 18, height 5' 2 (1.575 m), weight 58 kg, SpO2 98%.  Examination:  Physical Exam Constitutional:      Appearance: Normal appearance.  HENT:     Head: Normocephalic and atraumatic.     Mouth/Throat:     Mouth: Mucous membranes are moist.  Eyes:     Extraocular Movements: Extraocular movements intact.  Cardiovascular:     Rate and Rhythm: Normal rate and regular rhythm.  Pulmonary:     Effort: Pulmonary effort is normal. No respiratory distress.     Breath sounds: Normal breath sounds. No wheezing.  Abdominal:     General: Bowel sounds are normal. There is distension.     Palpations:  Abdomen is soft.     Tenderness: There is abdominal tenderness (Slowly improved) in the right upper quadrant, right lower quadrant, left upper quadrant and left lower quadrant.  Musculoskeletal:        General: Normal range of motion.     Cervical back: Normal range of motion and neck supple.  Skin:    General: Skin is warm and dry.  Neurological:     Mental Status: She is alert.     Comments: No focal deficits.  Mild confusion appreciated along with mild asterixis  Psychiatric:        Mood and Affect: Mood normal.      Consultants:  GI, signed off 10/9 Nephrology, signed off 10/8  Procedures:    Data Reviewed: Results for orders placed or performed during the hospital encounter of 10/03/23 (from the past 24 hours)  Body fluid cell count with differential     Status: Abnormal   Collection Time: 10/22/23  2:56 PM  Result Value Ref Range   Fluid Type-FCT ABDOMEN    Color, Fluid YELLOW YELLOW   Appearance, Fluid HAZY (A) CLEAR   Total Nucleated Cell Count, Fluid 118 0 - 1,000 cu mm   Neutrophil Count, Fluid 6 0 - 25 %   Lymphs, Fluid 29 %   Monocyte-Macrophage-Serous Fluid 65 50 - 90 %   Eos, Fluid 0 %   Other Cells, Fluid OTHER CELLS IDENTIFIED AS MESOTHELIAL CELLS %  Body fluid culture w Gram Stain     Status: None (Preliminary result)   Collection Time: 10/22/23  2:56 PM   Specimen: Abdomen; Peritoneal Fluid  Result Value Ref Range   Specimen Description ABDOMEN    Special Requests NONE    Gram Stain NO WBC SEEN NO ORGANISMS SEEN     Culture      NO GROWTH < 24 HOURS Performed at [facility], [address] [place], [name] [address]    Report Status PENDING   Glucose, capillary     Status: Abnormal   Collection Time: 10/22/23  4:31 PM  Result Value Ref Range   Glucose-Capillary 106 (H) 70 - 99 mg/dL  Glucose, capillary     Status: Abnormal   Collection Time: 10/22/23  8:08 PM  Result Value Ref Range   Glucose-Capillary 112 (H) 70 - 99 mg/dL  Glucose,  capillary     Status: Abnormal   Collection Time: 10/22/23  8:48 PM  Result Value Ref Range   Glucose-Capillary 123 (H) 70 - 99 mg/dL  Glucose, capillary     Status: Abnormal   Collection Time: 10/23/23 12:29 AM  Result Value Ref Range   Glucose-Capillary 124 (H) 70 - 99 mg/dL  Protime-INR     Status: Abnormal   Collection Time: 10/23/23  1:40  AM  Result Value Ref Range   Prothrombin Time 29.4 (H) 11.4 - 15.2 seconds   INR 2.6 (H) 0.8 - 1.2  CBC with Differential/Platelet     Status: Abnormal   Collection Time: 10/23/23  1:40 AM  Result Value Ref Range   WBC 13.8 (H) 4.0 - 10.5 K/uL   RBC 2.42 (L) 3.87 - 5.11 MIL/uL   Hemoglobin 8.0 (L) 12.0 - 15.0 g/dL   HCT 77.1 (L) 63.9 - 53.9 %   MCV 94.2 80.0 - 100.0 fL   MCH 33.1 26.0 - 34.0 pg   MCHC 35.1 30.0 - 36.0 g/dL   RDW 78.5 (H) 88.4 - 84.4 %   Platelets 143 (L) 150 - 400 K/uL   nRBC 0.0 0.0 - 0.2 %   Neutrophils Relative % 72 %   Neutro Abs 9.8 (H) 1.7 - 7.7 K/uL   Lymphocytes Relative 10 %   Lymphs Abs 1.4 0.7 - 4.0 K/uL   Monocytes Relative 16 %   Monocytes Absolute 2.3 (H) 0.1 - 1.0 K/uL   Eosinophils Relative 0 %   Eosinophils Absolute 0.1 0.0 - 0.5 K/uL   Basophils Relative 0 %   Basophils Absolute 0.1 0.0 - 0.1 K/uL   WBC Morphology MORPHOLOGY UNREMARKABLE    Smear Review Normal platelet morphology    Immature Granulocytes 2 %   Abs Immature Granulocytes 0.23 (H) 0.00 - 0.07 K/uL   Polychromasia PRESENT    Target Cells PRESENT   Comprehensive metabolic panel with GFR     Status: Abnormal   Collection Time: 10/23/23  1:40 AM  Result Value Ref Range   Sodium 125 (L) 135 - 145 mmol/L   Potassium 4.5 3.5 - 5.1 mmol/L   Chloride 93 (L) 98 - 111 mmol/L   CO2 21 (L) 22 - 32 mmol/L   Glucose, Bld 120 (H) 70 - 99 mg/dL   BUN 20 6 - 20 mg/dL   Creatinine, Ser 8.98 (H) 0.44 - 1.00 mg/dL   Calcium  9.3 8.9 - 10.3 mg/dL   Total Protein 6.1 (L) 6.5 - 8.1 g/dL   Albumin  3.4 (L) 3.5 - 5.0 g/dL   AST 68 (H) 15 - 41 U/L    ALT 13 0 - 44 U/L   Alkaline Phosphatase 57 38 - 126 U/L   Total Bilirubin 6.1 (H) 0.0 - 1.2 mg/dL   GFR, Estimated >39 >39 mL/min   Anion gap 11 5 - 15  Magnesium      Status: None   Collection Time: 10/23/23  1:40 AM  Result Value Ref Range   Magnesium  2.1 1.7 - 2.4 mg/dL  Glucose, capillary     Status: Abnormal   Collection Time: 10/23/23  4:10 AM  Result Value Ref Range   Glucose-Capillary 117 (H) 70 - 99 mg/dL  Glucose, capillary     Status: Abnormal   Collection Time: 10/23/23  9:05 AM  Result Value Ref Range   Glucose-Capillary 117 (H) 70 - 99 mg/dL   Comment 1 Notify RN    Comment 2 Document in Chart   Glucose, capillary     Status: Abnormal   Collection Time: 10/23/23 12:26 PM  Result Value Ref Range   Glucose-Capillary 117 (H) 70 - 99 mg/dL   Comment 1 Notify RN    Comment 2 Document in Chart     I have reviewed pertinent nursing notes, vitals, labs, and images as necessary. I have ordered labwork to follow up on as indicated.  I have  reviewed the last notes from staff over past 24 hours. I have discussed patient's care plan and test results with nursing staff, CM/SW, and other staff as appropriate.  Time spent: Greater than 50% of the 55 minute visit was spent in counseling/coordination of care for the patient as laid out in the A&P.   LOS: 20 days   Alm Apo, MD [facility] 10/23/2023, 2:07 PM

## 2023-10-24 DIAGNOSIS — Z515 Encounter for palliative care: Secondary | ICD-10-CM

## 2023-10-24 DIAGNOSIS — K7290 Hepatic failure, unspecified without coma: Secondary | ICD-10-CM | POA: Diagnosis not present

## 2023-10-24 DIAGNOSIS — D62 Acute posthemorrhagic anemia: Secondary | ICD-10-CM | POA: Diagnosis not present

## 2023-10-24 DIAGNOSIS — R112 Nausea with vomiting, unspecified: Secondary | ICD-10-CM | POA: Diagnosis not present

## 2023-10-24 LAB — COMPREHENSIVE METABOLIC PANEL WITH GFR
ALT: 14 U/L (ref 0–44)
AST: 69 U/L — ABNORMAL HIGH (ref 15–41)
Albumin: 3.4 g/dL — ABNORMAL LOW (ref 3.5–5.0)
Alkaline Phosphatase: 52 U/L (ref 38–126)
Anion gap: 9 (ref 5–15)
BUN: 23 mg/dL — ABNORMAL HIGH (ref 6–20)
CO2: 21 mmol/L — ABNORMAL LOW (ref 22–32)
Calcium: 9.2 mg/dL (ref 8.9–10.3)
Chloride: 95 mmol/L — ABNORMAL LOW (ref 98–111)
Creatinine, Ser: 0.75 mg/dL (ref 0.44–1.00)
GFR, Estimated: >60 mL/min (ref >60)
Glucose, Bld: 102 mg/dL — ABNORMAL HIGH (ref 70–99)
Potassium: 4.7 mmol/L (ref 3.5–5.1)
Sodium: 125 mmol/L — ABNORMAL LOW (ref 135–145)
Total Bilirubin: 5.9 mg/dL — ABNORMAL HIGH (ref 0.0–1.2)
Total Protein: 6.2 g/dL — ABNORMAL LOW (ref 6.5–8.1)

## 2023-10-24 LAB — GLUCOSE, CAPILLARY
Glucose-Capillary: 106 mg/dL — ABNORMAL HIGH (ref 70–99)
Glucose-Capillary: 95 mg/dL (ref 70–99)
Glucose-Capillary: 94 mg/dL (ref 70–99)
Glucose-Capillary: 106 mg/dL — ABNORMAL HIGH (ref 70–99)
Glucose-Capillary: 100 mg/dL — ABNORMAL HIGH (ref 70–99)

## 2023-10-24 LAB — CBC WITH DIFFERENTIAL/PLATELET
Abs Immature Granulocytes: 0.18 K/uL — ABNORMAL HIGH (ref 0.00–0.07)
Basophils Absolute: 0 K/uL (ref 0.0–0.1)
Basophils Relative: 0 %
Eosinophils Absolute: 0.1 K/uL (ref 0.0–0.5)
Eosinophils Relative: 1 %
HCT: 23.8 % — ABNORMAL LOW (ref 36.0–46.0)
Hemoglobin: 8.2 g/dL — ABNORMAL LOW (ref 12.0–15.0)
Immature Granulocytes: 2 %
Lymphocytes Relative: 11 %
Lymphs Abs: 1.3 K/uL (ref 0.7–4.0)
MCH: 32.9 pg (ref 26.0–34.0)
MCHC: 34.5 g/dL (ref 30.0–36.0)
MCV: 95.6 fL (ref 80.0–100.0)
Monocytes Absolute: 1.9 K/uL — ABNORMAL HIGH (ref 0.1–1.0)
Monocytes Relative: 16 %
Neutro Abs: 8.5 K/uL — ABNORMAL HIGH (ref 1.7–7.7)
Neutrophils Relative %: 70 %
Platelets: 164 K/uL (ref 150–400)
RBC: 2.49 MIL/uL — ABNORMAL LOW (ref 3.87–5.11)
RDW: 21.5 % — ABNORMAL HIGH (ref 11.5–15.5)
Smear Review: NORMAL
WBC: 12 K/uL — ABNORMAL HIGH (ref 4.0–10.5)
nRBC: 0 % (ref 0.0–0.2)

## 2023-10-24 LAB — MAGNESIUM: Magnesium: 1.9 mg/dL (ref 1.7–2.4)

## 2023-10-24 LAB — AMMONIA: Ammonia: 88 umol/L — ABNORMAL HIGH (ref 9–35)

## 2023-10-24 LAB — PROTIME-INR
INR: 2.4 — ABNORMAL HIGH (ref 0.8–1.2)
Prothrombin Time: 26.9 s — ABNORMAL HIGH (ref 11.4–15.2)

## 2023-10-24 NOTE — Progress Notes (Signed)
 Physical Therapy Treatment Patient Details Name: Joan Wood MRN: [id number] DOB: 1970-07-08 Today's Date: 10/24/2023   History of Present Illness 53 y.o. female presents to [facility] hospital on 10/03/2023 with nausea/vomiting and bloody diarrhea. Pt underwent upper EGD on 9/25, concerning for possible acute esophageal necrosis. Pt also underwent paracentesis on 9/25 and 10/6. PMH includes decompensated cirrhosis, PUD, GERD, pancreatitis, OSA.    PT Comments  Pt received in supine and agreeable to session. Pt able to sit to EOB with min A and increased time. Pt continues to require frequent cues to maintain attention to task and for memory. Unable to take sitting BP due to pt becoming emotional when her daughter called and unable to redirect. Pt requests to return to supine and have the doctor come to talk with her and her daughter, RN notified. Pt reports no dizziness while sitting EOB. Pt continues to benefit from PT services to progress toward functional mobility goals.     If plan is discharge home, recommend the following: A lot of help with walking and/or transfers;A lot of help with bathing/dressing/bathroom;Assistance with cooking/housework;Assist for transportation;Help with stairs or ramp for entrance   Can travel by private vehicle     No  Equipment Recommendations  Hospital bed;BSC/3in1    Recommendations for Other Services       Precautions / Restrictions Precautions Precautions: Fall Recall of Precautions/Restrictions: Impaired Precaution/Restrictions Comments: orthostatic, foley, flexiseal Restrictions Weight Bearing Restrictions Per Provider Order: No     Mobility  Bed Mobility Overal bed mobility: Needs Assistance Bed Mobility: Supine to Sit, Sit to Supine     Supine to sit: Min assist, HOB elevated, Used rails Sit to supine: Mod assist   General bed mobility comments: HOB significantly elevated with min A to scoot to EOB. Mod A to elevate BLE back to EOB to return  to supine.    Transfers                   General transfer comment: unable due to pt emotional and requesting to return to supine    Ambulation/Gait                   Stairs             Wheelchair Mobility     Tilt Bed    Modified Rankin (Stroke Patients Only)       Balance Overall balance assessment: Needs assistance Sitting-balance support: No upper extremity supported, Feet supported, Bilateral upper extremity supported Sitting balance-Leahy Scale: Fair Sitting balance - Comments: CGA sitting EOB statically       Standing balance comment: nt                            Communication Communication Communication: No apparent difficulties  Cognition Arousal: Alert Behavior During Therapy: Lability, Anxious   PT - Cognitive impairments: No family/caregiver present to determine baseline, Awareness, Memory, Attention, Sequencing, Problem solving, Safety/Judgement                       PT - Cognition Comments: Pt emotional intermittently requiring redirection to tasks. Ultimately required return to supine with pt crying and requesting the doctor Following commands: Impaired Following commands impaired: Follows multi-step commands inconsistently, Follows one step commands with increased time    Cueing Cueing Techniques: Verbal cues, Visual cues, Tactile cues  Exercises      General Comments General comments (skin integrity,  edema, etc.): BP longsitting in bed: 103/68      Pertinent Vitals/Pain Pain Assessment Pain Assessment: Faces Faces Pain Scale: Hurts whole lot Pain Location: anus Pain Descriptors / Indicators: Discomfort, Sore, Grimacing Pain Intervention(s): Limited activity within patient's tolerance, Monitored during session, Repositioned     PT Goals (current goals can now be found in the care plan section) Acute Rehab PT Goals Patient Stated Goal: to return to independence PT Goal Formulation: With  patient Time For Goal Achievement: 11/02/23 Progress towards PT goals: Progressing toward goals    Frequency    Min 2X/week       AM-PAC PT 6 Clicks Mobility   Outcome Measure  Help needed turning from your back to your side while in a flat bed without using bedrails?: A Little Help needed moving from lying on your back to sitting on the side of a flat bed without using bedrails?: A Lot Help needed moving to and from a bed to a chair (including a wheelchair)?: Total Help needed standing up from a chair using your arms (e.g., wheelchair or bedside chair)?: Total Help needed to walk in hospital room?: Total Help needed climbing 3-5 steps with a railing? : Total 6 Click Score: 9    End of Session   Activity Tolerance: Other (comment) (Pt emotional) Patient left: in bed;with call bell/phone within reach;with bed alarm set Nurse Communication: Mobility status PT Visit Diagnosis: Other abnormalities of gait and mobility (R26.89);Muscle weakness (generalized) (M62.81)     Time: 1340-1410 PT Time Calculation (min) (ACUTE ONLY): 30 min  Charges:    $Therapeutic Activity: 23-37 mins PT General Charges $$ ACUTE PT VISIT: 1 Visit                    Darryle George, PTA Acute Rehabilitation Services Secure Chat Preferred  [phone]    Darryle George 10/24/2023, 2:55 PM

## 2023-10-24 NOTE — Progress Notes (Addendum)
 [facility]                                                                               Joan Wood, is a 53 y.o. female, DOB - [birth year], FMW:[id number] Admit date - 10/03/2023    Outpatient Primary MD for the patient is Bacchus, Meade PEDLAR, FNP  LOS - 21  days    Brief summary  Joan Wood is a 53 year old female with decompensated cirrhosis, PUD with recent gastric ulcer perforation s/p ex-lap, GERD, pancreatitis, OSA who presented to [facility] with N/V/bloody diarrhea x 4 days.    She had coffee ground emesis in the ED. CTA GI neg for acute bleed, large ascites and new distal esophageal thickening.    GI consulted for acute blood loss anemia with Hg 8.3>7. She was given PPI, started on octreotide  gtt, rocephin , vitamin K , pain and nausea control and admitted to hospitalist.    Now s/p upper endoscopy showing black discoloration of esophageal mucosa concerning for acute esophageal necrosis, portal hypertension, non-bleeding gastric and duodenal ulcers.    Transferred to [facility] ICU for further management.    Significant Events 9/24: ED for upper GI bleed admit to hospitalist  9/25: 1 U PRBC, GI upper endoscopy w/ acute esophageal necrosis with recommendation for transfer to [facility] > NPO, s/p paracentesis 3.35L drained Transferred to hospitalists service 9/27 GI signed off 9/30 10/1 US  without sufficient ascites to tap 10/3 CT with findings concerning for appendicitis, surgery recommending antibiotics (high risk for surgery).  GI reconsulted to to progressive liver failure.  10/4 diagnostic para without evidence SBP 10/5 diuretics held with doubling creatinine 10/6 para with 4 L off.  Renal consult for progressive AKI. 10/115 pt pleasantly confused, , has rectal tube and foley catheter.     Assessment & Plan    Assessment and Plan:   Decompensated Alcoholic Cirrhosis Hepatic Encephalopathy - improved Ascites  Bilateral Pleural Effusions  Hypoalbuminemia   Thrombocytopenia  - S/p 5 days ceftriaxone  (now on abx with appendicitis below) -Completed trial of NAC as well - s/p paracentesis as needed; last on 10/6, 10/13 . - still remains fluid overloaded, getting lasix prn.  - poor oral intake.  - palliative care consulted for GOC discussions as prognosis is poor and MELD Na Score remains high.  - unable to reach her daughter.    AKI - presumed ATN 2/2 contrast - not an HD candidate - nephrology signed off. - voiding trial when more stable.    Appendicitis - Appreciate gen surgery, she's high risk for surgery in setting of her cirrhosis - s/p Rocephin  and Flagyl ; plan for total of 10-day course; completed 10/13 - Repeat CT A/P performed 10/16/2023 showing unchanged appendix, 1.1 cm in caliber   Acute upper GI bleed Acute esophageal necrosis Acute blood loss anemia Patient has h/o PUD, gastric ulcer perforation s/p sx lap 07/2023. Now s/p EGD showing grade D esophagitis, black discoloration mucosa in esophagus concerning for acute esophageal necrosis, portal hypertensive gastropathy, non bleeding gastric ulcer with clean ulcer base with apparent sutures.  No bleeding duodenal ulcers with a clean ulcer base.  No evidence of active bleeding or varices.  Transferred to [facility] given findings concerning for acute esophageal necrosis Gastrin level wnl  GI recommending carafate  x14 days, BID PPI until follow up with GI primary at [facility].    Abdominal Pain - improved  Suspect related to abdominal distension from ascites +/- possible appendicitis  Repeat CT scan as noted    Orthostatic Hypotension Currently on Midodrine  and BP parameters are borderline low.    Right Lower Extremity Edema Right Lower Extremity Pain LE US  - no evidence of deep vein thrombosis in lower extremity CT with moderate generalized subcutaneous edema throughout both visualized size, similar to recent pelvic CT (anasarca/third spacing vs cellulitis) She's on abx  which would cover cellulitis, though lower suspicion for this Pain radiating all down leg, ? Neuropathic pain Continue gabapentin     Hypervolemic Hyponatremia Sodium at 125 today. From hypervolemia from Cirrhosis.    Hypomagnesemia  Hypophosphatemia  Hypokalemia Replaced, follow up intermittently.    Tobacco abuse Continue nicotine  patch.   Alcohol abuse Continue thiamine , folate, MVT.   Severe Protein Calorie Malnutrition Body mass index is 28.95 kg/m RD     RN Pressure Injury Documentation: Wound 10/15/23 0900 Pressure Injury Sacrum Mid Stage 2 -  Partial thickness loss of dermis presenting as a shallow open injury with a red, pink wound bed without slough. (Active)    Malnutrition Type:  Nutrition Problem: Severe Malnutrition Etiology: chronic illness (cirrhosis)   Malnutrition Characteristics:  Signs/Symptoms: severe fat depletion, severe muscle depletion   Nutrition Interventions:  Interventions: Ensure Enlive (each supplement provides 350kcal and 20 grams of protein), MVI, Liberalize Diet, Magic cup  Estimated body mass index is 22.34 kg/m as calculated from the following:   Height as of this encounter: 5' 2 (1.575 m).   Weight as of this encounter: 55.4 kg.  Code Status: full code.  DVT Prophylaxis:  SCDs Start: 10/03/23 2153   Level of Care: Level of care: Progressive Family Communication:none at bedside. Patient unable to give me any information regarding who to contact or not to contact.   Disposition Plan:     Remains inpatient appropriate:  pending clinical improvement.   Procedures:  Paracentesis   Consultants:   Palliative care Gastroenterology.  Nephrology.  Antimicrobials:   Anti-infectives (From admission, onward)    Start     Dose/Rate Route Frequency Ordered Stop   10/12/23 1000  metroNIDAZOLE  (FLAGYL ) tablet 500 mg        500 mg Oral Every 12 hours 10/12/23 0734 10/22/23 2125   10/12/23 0830  cefTRIAXone  (ROCEPHIN ) 2 g in  sodium chloride  0.9 % 100 mL IVPB        2 g 200 mL/hr over 30 Minutes Intravenous Every 24 hours 10/12/23 0734 10/22/23 1636   10/07/23 1000  fluconazole  (DIFLUCAN ) tablet 100 mg  Status:  Discontinued        100 mg Oral Daily 10/07/23 0853 10/07/23 0855   10/07/23 1000  fluconazole  (DIFLUCAN ) tablet 150 mg        150 mg Oral Daily 10/07/23 0855 10/07/23 1014   10/04/23 1000  cefTRIAXone  (ROCEPHIN ) 2 g in sodium chloride  0.9 % 100 mL IVPB        2 g 200 mL/hr over 30 Minutes Intravenous Every 24 hours 10/03/23 2154 10/08/23 0854   10/03/23 2015  cefTRIAXone  (ROCEPHIN ) 1 g in sodium chloride  0.9 % 100 mL IVPB        1 g 200 mL/hr over 30 Minutes Intravenous  Once 10/03/23 2010 10/03/23 2212  Medications  Scheduled Meds:  calcium  carbonate  1 tablet Oral TID WC   Chlorhexidine  Gluconate Cloth  6 each Topical Daily   feeding supplement  237 mL Oral TID BM   folic acid   1 mg Oral Daily   gabapentin   300 mg Oral TID   lactulose  30 g Oral TID   lidocaine   2 patch Transdermal Q24H   midodrine   15 mg Oral Q8H   multivitamin with minerals  1 tablet Oral Daily   nicotine   21 mg Transdermal Daily   nutrition supplement (JUVEN)  1 packet Oral BID BM   pantoprazole   40 mg Oral BID   sucralfate   1 g Oral Q6H   thiamine   100 mg Oral Daily   Continuous Infusions: PRN Meds:.albuterol , alum & mag hydroxide-simeth, artificial tears, diphenhydrAMINE , Muscle Rub, ondansetron  (ZOFRAN ) IV, mouth rinse, oxyCODONE , phenol, prochlorperazine, simethicone , witch hazel-glycerin    Subjective:   Haunani Dickard was seen and examined today.  Pt confused, opened eyes, says she will be up, asked me where her kids are. She couldn't answer orientation questions. Says she needs to get better. She asked me to come back.  Family is not at bedside, . Unable to reach family. No HCPOA  on chart .   Objective:   Vitals:   10/24/23 0349 10/24/23 0500 10/24/23 0800 10/24/23 1151  BP: 111/67  113/63 93/60   Pulse: 87  87 97  Resp: 15  19 17   Temp: 97.9 F (36.6 C)  98.2 F (36.8 C) 98.1 F (36.7 C)  TempSrc: Oral     SpO2: 100%   100%  Weight:  55.4 kg    Height:        Intake/Output Summary (Last 24 hours) at 10/24/2023 1335 Last data filed at 10/24/2023 0600 Gross per 24 hour  Intake 420 ml  Output 400 ml  Net 20 ml   Filed Weights   10/20/23 0500 10/21/23 0500 10/24/23 0500  Weight: 64.6 kg 58 kg 55.4 kg     Exam General exam: ill appearing lady, confused., sleepy.  Respiratory system: air entry fair.  Cardiovascular system: S1 & S2 heard, RRR.  Gastrointestinal system: Abdomen is soft, distended, bs+ rectal tube in place.  Central nervous system: Alert , oriented to person, confused, able to move all extremities spontaneously.  Extremities: no cyanosis.  Skin: No rashes, Psychiatry: pleasantly confused.    Data Reviewed:  I have personally reviewed following labs and imaging studies   CBC Lab Results  Component Value Date   WBC 12.0 (H) 10/24/2023   RBC 2.49 (L) 10/24/2023   HGB 8.2 (L) 10/24/2023   HCT 23.8 (L) 10/24/2023   MCV 95.6 10/24/2023   MCH 32.9 10/24/2023   PLT 164 10/24/2023   MCHC 34.5 10/24/2023   RDW 21.5 (H) 10/24/2023   LYMPHSABS 1.3 10/24/2023   MONOABS 1.9 (H) 10/24/2023   EOSABS 0.1 10/24/2023   BASOSABS 0.0 10/24/2023     Last metabolic panel Lab Results  Component Value Date   NA 125 (L) 10/24/2023   K 4.7 10/24/2023   CL 95 (L) 10/24/2023   CO2 21 (L) 10/24/2023   BUN 23 (H) 10/24/2023   CREATININE 0.75 10/24/2023   GLUCOSE 102 (H) 10/24/2023   GFRNONAA >60 10/24/2023   CALCIUM  9.2 10/24/2023   PHOS 2.6 10/17/2023   PROT 6.2 (L) 10/24/2023   ALBUMIN  3.4 (L) 10/24/2023   LABGLOB 4.4 07/30/2023   AGRATIO 1.2 03/28/2022   BILITOT 5.9 (H) 10/24/2023  ALKPHOS 52 10/24/2023   AST 69 (H) 10/24/2023   ALT 14 10/24/2023   ANIONGAP 9 10/24/2023    CBG (last 3)  Recent Labs    10/24/23 0350 10/24/23 0832  10/24/23 1151  GLUCAP 106* 95 94      Coagulation Profile: Recent Labs  Lab 10/20/23 0345 10/21/23 0559 10/22/23 0150 10/23/23 0140 10/24/23 0308  INR 2.7* 2.9* 2.7* 2.6* 2.4*     Radiology Studies: IR Paracentesis Result Date: 10/22/2023 INDICATION: 53 year old female with history of cirrhosis. Received request for diagnostic and therapeutic paracentesis. EXAM: ULTRASOUND GUIDED DIAGNOSTIC AND THERAPEUTIC PARACENTESIS MEDICATIONS: 10 mL 1% lidocaine . COMPLICATIONS: None immediate. PROCEDURE: Informed written consent was obtained from the patient after a discussion of the risks, benefits and alternatives to treatment. A timeout was performed prior to the initiation of the procedure. Initial ultrasound scanning demonstrates a moderate amount of ascites within the right lower abdominal quadrant. The right lower abdomen was prepped and draped in the usual sterile fashion. 1% lidocaine  was used for local anesthesia. Following this, a 19 gauge, 7-cm, Yueh catheter was introduced. An ultrasound image was saved for documentation purposes. The paracentesis was performed. The catheter was removed and a dressing was applied. The patient tolerated the procedure well without immediate post procedural complication. FINDINGS: A total of approximately 3.3 liters of clear yellow fluid was removed. Samples were sent to the laboratory as requested by the clinical team. IMPRESSION: Successful ultrasound-guided paracentesis yielding 3.3 liters of peritoneal fluid. Performed by: Kristi Davenport, NP Electronically Signed   By: Juliene Balder M.D.   On: 10/22/2023 16:12       Elgie Butter M.D. [facility] 10/24/2023, 1:35 PM  Available via Epic secure chat 7am-7pm After 7 pm, please refer to night coverage provider listed on amion.

## 2023-10-24 NOTE — Progress Notes (Addendum)
 Palliative Medicine Inpatient Follow Up Note   HPI: 53 y.o. female  with past medical history of  s/p exploratory laparotomy for perforated gastric ulcer repair with omental patch on 7/27, ongoing alcohol use disorder, tobacco abuse, cirrhosis, HTN, GERD, anxiety/depression, multiple hospitalizations,  admitted on 10/03/2023 at [facility] with complaints of nausea, vomiting and coffee-ground emesis, symptoms ongoing for last 2 days, she reports last alcoholic beverage she had Monday or Sunday, for nausea, vomiting and diarrhea has been ongoing for last 2 days, and it turned today into coffee-ground emesis. Later (10/04/2023) transferred to [facility] ICU for further management. Now status post upper endoscopy, showing black discoloration of esophageal mucosa concerning for acute esophageal necrosis, portal hypertension, non-bleeding gastric and duodenal ulcers.    Paracentesis on 10/15/2023: 4.0 liters of ascitic fluid removed. Paracentesis on 10/22/2023: 3.3 liters of ascitic fluid removed.   Worth to note that this is her 7 inpatient admissions, and has 4 ED encounter. Her most recent hospital stay was in August 2025 due to GI bleed and abdominal pain.    PMT has been consulted to assist with goals of care conversation. Patient/Family face treatment option decisions, advanced directive decisions and anticipatory care needs.    Family face treatment option decision, advance directive decisions and anticipatory care needs.    Today's Discussion 10/24/2023  *Please note that this is a verbal dictation therefore any spelling or grammatical errors are due to the Dragon Medical One system interpretation.  Chart reviewed inclusive of vital signs, progress notes, laboratory results, and diagnostic images.  Ammonia level increasing from 62 last 10/15/23 to 78 10/20/2023.   I visited the patient at bedside today; no family members were present. The patient appeared chronically ill but was not in acute distress.  She was alert, more oriented and conversational than previous days, and able to express her needs. She reported intermittent rectal discomfort related to the presence of a Flexi-Seal. She shared that yesterday was difficult, but today has been better. Her friend, [name], was present and appeared to provide emotional support, helping the patient remain calm and at peace during our conversation.  According to the bedside RN, there were no significant overnight events. The patient's blood pressure remains soft and she continues on midodrine .  I revisited goals of care with the patient and Jarratt. We discussed her overall prognosis, including her high risk for continued decline and decompensation. Upon reviewing treatment options, the patient became emotional and overwhelmed, recognizing the extent of her disease burden. I reassured her that the PMT and medical team will continue to support her and provide care aligned with her goals.  We revisited the idea of informing her children about her current condition. She stated she plans to eventually notify her eldest daughter, Shauntel Prest, but firmly declined my offer to call Duwaine on her behalf, saying, "Not right now, I will be the one to call her." She reiterated that she does not want to be a burden to her children. I gently encouraged her to consider sharing her condition sooner rather than later, given her health status. [name] supported this suggestion.  The patient strongly expressed her desire to remain full code, stating, "Do everything, I am afraid to die." I encouraged her to consider DNR/DNI status, explaining the poor outcomes associated with resuscitation in patients with advanced illness and that DNR/DNI only applies after cardiac arrest. I emphasized that it is a protective measure to avoid harm in the final moments of life.  Regarding her advance directive,  she agreed to meet with the chaplain tomorrow, 10/25/2023 at 1 PM, to begin the  process. She noted that having Jarratt present during that time would help her feel more relaxed and supported.  Patient face treatment option decisions, advanced directive decisions and anticipatory care needs.   Questions and concerns addressed   Palliative Support Provided.   Objective Assessment:  Physical Exam Constitutional:      Appearance: chronically-ill, fatigued-looking, flexiseal in place. HENT:     Head: Normocephalic and atraumatic. :  Cardiovascular:     Normal rate Pulmonary:     Effort: Pulmonary effort is normal. No respiratory distress.     Breath sounds: Normal breath sounds. No wheezing.  Abdominal:     General: Bowel sounds are normal. There is distension.     Palpations: Abdomen is soft.     Tenderness: positive for abdominal ternderness Musculoskeletal:        General: Normal range of motion.  Skin:    General: Skin is warm and dry.  Neurological:     General: No focal deficit present.     Mental Status: She is alert.    Vital Signs Vitals:   10/24/23 0800 10/24/23 1151  BP: 113/63 93/60  Pulse: 87 97  Resp: 19 17  Temp: 98.2 F (36.8 C) 98.1 F (36.7 C)  SpO2:  100%    Intake/Output Summary (Last 24 hours) at 10/24/2023 1330 Last data filed at 10/24/2023 0600 Gross per 24 hour  Intake 420 ml  Output 400 ml  Net 20 ml   Last Weight  Most recent update: 10/24/2023  6:35 AM    Weight  55.4 kg (122 lb 2.2 oz)                SUMMARY OF RECOMMENDATIONS   Code Status: Maintain Full Code Continue current scope of care Goal of care is medical stabilization and recovery to the extent this is possible The patient was encouraged to initiate open communication with her children regarding her current medical condition, including both her short- and long-term GOC. Spiritual care consult to assist with AD creation, patient desires to have it done on 10/25/2023 at 1PM with friend [name] present.  Continue to provide psycho-social and  emotional support to patient and family Palliative medicine team will continue to follow.   Symptom Management: Per Primary team Palliative medicine is available to assist as needed.      Time Spent: 50 minutes  Detailed review of medical records (labs, imaging, vital signs), medically appropriate exam, discussed with treatment team, counseling and education to patient, family, & staff, documenting clinical information, coordination of care.   ______________________________________________________________________________________ Kathlyne Bolder NP-C [facility] Palliative Medicine Team Team Cell Phone: [phone] Please utilize secure chat with additional questions, if there is no response within 30 minutes please call the above phone number  Palliative Medicine Team providers are available by phone from 7am to 7pm daily and can be reached through the team cell phone.  Should this patient require assistance outside of these hours, please call the patient's attending physician.

## 2023-10-24 NOTE — Plan of Care (Signed)
   Problem: Nutrition: Goal: Adequate nutrition will be maintained Outcome: Progressing   Problem: Pain Managment: Goal: General experience of comfort will improve and/or be controlled Outcome: Progressing   Problem: Safety: Goal: Ability to remain free from injury will improve Outcome: Progressing

## 2023-10-25 DIAGNOSIS — Z515 Encounter for palliative care: Secondary | ICD-10-CM | POA: Diagnosis not present

## 2023-10-25 DIAGNOSIS — D62 Acute posthemorrhagic anemia: Secondary | ICD-10-CM | POA: Diagnosis not present

## 2023-10-25 DIAGNOSIS — R112 Nausea with vomiting, unspecified: Secondary | ICD-10-CM | POA: Diagnosis not present

## 2023-10-25 DIAGNOSIS — K7290 Hepatic failure, unspecified without coma: Secondary | ICD-10-CM | POA: Diagnosis not present

## 2023-10-25 LAB — CBC WITH DIFFERENTIAL/PLATELET
Abs Immature Granulocytes: 0.26 K/uL — ABNORMAL HIGH (ref 0.00–0.07)
Basophils Absolute: 0.1 K/uL (ref 0.0–0.1)
Basophils Relative: 0 %
Eosinophils Absolute: 0.1 K/uL (ref 0.0–0.5)
Eosinophils Relative: 1 %
HCT: 22 % — ABNORMAL LOW (ref 36.0–46.0)
Hemoglobin: 7.5 g/dL — ABNORMAL LOW (ref 12.0–15.0)
Immature Granulocytes: 2 %
Lymphocytes Relative: 13 %
Lymphs Abs: 1.7 K/uL (ref 0.7–4.0)
MCH: 32.9 pg (ref 26.0–34.0)
MCHC: 34.1 g/dL (ref 30.0–36.0)
MCV: 96.5 fL (ref 80.0–100.0)
Monocytes Absolute: 1.9 K/uL — ABNORMAL HIGH (ref 0.1–1.0)
Monocytes Relative: 15 %
Neutro Abs: 9.1 K/uL — ABNORMAL HIGH (ref 1.7–7.7)
Neutrophils Relative %: 69 %
Platelets: 140 K/uL — ABNORMAL LOW (ref 150–400)
RBC: 2.28 MIL/uL — ABNORMAL LOW (ref 3.87–5.11)
RDW: 22 % — ABNORMAL HIGH (ref 11.5–15.5)
Smear Review: NORMAL
WBC: 13.2 K/uL — ABNORMAL HIGH (ref 4.0–10.5)
nRBC: 0 % (ref 0.0–0.2)

## 2023-10-25 LAB — GLUCOSE, CAPILLARY
Glucose-Capillary: 108 mg/dL — ABNORMAL HIGH (ref 70–99)
Glucose-Capillary: 111 mg/dL — ABNORMAL HIGH (ref 70–99)
Glucose-Capillary: 111 mg/dL — ABNORMAL HIGH (ref 70–99)
Glucose-Capillary: 124 mg/dL — ABNORMAL HIGH (ref 70–99)
Glucose-Capillary: 85 mg/dL (ref 70–99)
Glucose-Capillary: 99 mg/dL (ref 70–99)

## 2023-10-25 LAB — COMPREHENSIVE METABOLIC PANEL WITH GFR
ALT: 15 U/L (ref 0–44)
AST: 77 U/L — ABNORMAL HIGH (ref 15–41)
Albumin: 3 g/dL — ABNORMAL LOW (ref 3.5–5.0)
Alkaline Phosphatase: 64 U/L (ref 38–126)
Anion gap: 10 (ref 5–15)
BUN: 29 mg/dL — ABNORMAL HIGH (ref 6–20)
CO2: 20 mmol/L — ABNORMAL LOW (ref 22–32)
Calcium: 9.2 mg/dL (ref 8.9–10.3)
Chloride: 92 mmol/L — ABNORMAL LOW (ref 98–111)
Creatinine, Ser: 0.55 mg/dL (ref 0.44–1.00)
GFR, Estimated: >60 mL/min (ref >60)
Glucose, Bld: 99 mg/dL (ref 70–99)
Potassium: 4.3 mmol/L (ref 3.5–5.1)
Sodium: 122 mmol/L — ABNORMAL LOW (ref 135–145)
Total Bilirubin: 4.8 mg/dL — ABNORMAL HIGH (ref 0.0–1.2)
Total Protein: 5.9 g/dL — ABNORMAL LOW (ref 6.5–8.1)

## 2023-10-25 LAB — PROTIME-INR
INR: 2.5 — ABNORMAL HIGH (ref 0.8–1.2)
Prothrombin Time: 28.6 s — ABNORMAL HIGH (ref 11.4–15.2)

## 2023-10-25 LAB — MAGNESIUM: Magnesium: 1.8 mg/dL (ref 1.7–2.4)

## 2023-10-25 MED ORDER — FUROSEMIDE 40 MG PO TABS
40.0000 mg | ORAL_TABLET | Freq: Once | ORAL | Status: AC
  Administered 2023-10-25: 40 mg via ORAL
  Filled 2023-10-25: qty 1

## 2023-10-25 MED ORDER — RIFAXIMIN 550 MG PO TABS
550.0000 mg | ORAL_TABLET | Freq: Two times a day (BID) | ORAL | Status: DC
Start: 2023-10-25 — End: 2023-11-10
  Administered 2023-10-25 – 2023-11-10 (×33): 550 mg via ORAL
  Filled 2023-10-25 (×33): qty 1

## 2023-10-25 NOTE — Progress Notes (Signed)
 This chaplain joined the Pt., the Pt. friend-Jarrett, and PMT NP-Erwin for Advance Directive education. The chaplain reviewed the Pt. chart notes and received an update from the NP-Erwin before the visit.  The chaplain listened reflectively during AD education, empowering the Pt. to lead the conversation. Education today included identifying the role and the person to serve as the Pt. healthcare agent. The chaplain understands the Pt. chooses her daughter-Megan.   Chaplain AD education stopped and the Pt. agreed to revisit AD education at another time. The chaplain will lean on PMT for the best time to visit. An incomplete AD packet was left at the bedside.  This chaplain is available for F/U spiritual care as needed.  Chaplain [name] Hummer [phone]

## 2023-10-25 NOTE — TOC Progression Note (Signed)
 Transition of Care [facility]) - Progression Note    Patient Details  Name: Joan Wood MRN: [id number] Date of Birth: May 20, 1970  [facility] CM/SW Contact  Andrez JULIANNA George, RN Phone Number: 10/25/2023, 12:53 PM  Clinical Narrative:     IP Care management continuing to follow for d/c needs.   Expected Discharge Plan: OP Rehab Barriers to Discharge: Continued Medical Work up               Expected Discharge Plan and Services In-house Referral: Clinical Social Work Discharge Planning Services: CM Consult Post Acute Care Choice: Home Health Living arrangements for the past 2 months: Single Family Home                 DME Arranged: Walker rolling   Date DME Agency Contacted: 10/08/23   Representative spoke with at DME Agency: [name]             Social Drivers of Health (SDOH) Interventions SDOH Screenings   Food Insecurity: No Food Insecurity (10/03/2023)  Housing: Low Risk  (10/03/2023)  Transportation Needs: No Transportation Needs (10/03/2023)  Recent Concern: Transportation Needs - Unmet Transportation Needs (08/20/2023)  Utilities: Not At Risk (10/03/2023)  Depression (PHQ2-9): High Risk (07/30/2023)  Social Connections: Socially Isolated (10/03/2023)  Tobacco Use: High Risk (10/04/2023)    Readmission Risk Interventions    10/04/2023    9:02 AM 09/03/2023   12:31 PM 09/02/2023    9:01 AM  Readmission Risk Prevention Plan  Transportation Screening Complete Complete Complete  PCP or Specialist Appt within 3-5 Days   Complete  HRI or Home Care Consult   Complete  Social Work Consult for Recovery Care Planning/Counseling   Complete  Palliative Care Screening   Not Applicable  Medication Review Oceanographer) Complete Complete Complete  HRI or Home Care Consult Complete Complete   SW Recovery Care/Counseling Consult Complete Complete   Palliative Care Screening Not Applicable Not Applicable   Skilled Nursing Facility Not Applicable Patient  Refused

## 2023-10-25 NOTE — Progress Notes (Signed)
 [facility]                                                                               Joan Wood, is a 53 y.o. female, DOB - [birth year], FMW:[id number] Admit date - 10/03/2023    Outpatient Primary MD for the patient is Bacchus, Meade PEDLAR, FNP  LOS - 22  days    Brief summary  Kseniya Grunden is a 53 year old female with decompensated cirrhosis, PUD with recent gastric ulcer perforation s/p ex-lap, GERD, pancreatitis, OSA who presented to [facility] with N/V/bloody diarrhea x 4 days.    She had coffee ground emesis in the ED. CTA GI neg for acute bleed, large ascites and new distal esophageal thickening.    GI consulted for acute blood loss anemia with Hg 8.3>7. She was given PPI, started on octreotide  gtt, rocephin , vitamin K , pain and nausea control and admitted to hospitalist.    Now s/p upper endoscopy showing black discoloration of esophageal mucosa concerning for acute esophageal necrosis, portal hypertension, non-bleeding gastric and duodenal ulcers.    Transferred to [facility] ICU for further management.    Significant Events 9/24: ED for upper GI bleed admit to hospitalist  9/25: 1 U PRBC, GI upper endoscopy w/ acute esophageal necrosis with recommendation for transfer to [facility] > NPO, s/p paracentesis 3.35L drained Transferred to hospitalists service 9/27 GI signed off 9/30 10/1 US  without sufficient ascites to tap 10/3 CT with findings concerning for appendicitis, surgery recommending antibiotics (high risk for surgery).  GI reconsulted to to progressive liver failure.  10/4 diagnostic para without evidence SBP 10/5 diuretics held with doubling creatinine 10/6 para with 4 L off.  Renal consult for progressive AKI. 10/115 pt pleasantly confused, , has rectal tube and foley catheter.     Assessment & Plan    Assessment and Plan:   Decompensated Alcoholic Cirrhosis Hepatic Encephalopathy - improved Ascites  Bilateral Pleural Effusions  Hypoalbuminemia   Thrombocytopenia  - S/p 5 days ceftriaxone  (now on abx with appendicitis below) -Completed trial of NAC as well - s/p paracentesis as needed; last on 10/6, 10/13 . - still remains fluid overloaded, abdomen is distended, one dose of lasix ordered.  - poor oral intake.  - palliative care consulted for GOC discussions as prognosis is poor and MELD Na Score remains high.  - patient continues to decline, more confused despite 30 g of lactulose TID, adding rifaximin.  - ammonia level elevated at 88.    AKI - presumed ATN 2/2 contrast - not an HD candidate - nephrology signed off. - voiding trial when more stable.    Appendicitis - Appreciate gen surgery, she's high risk for surgery in setting of her cirrhosis - s/p Rocephin  and Flagyl ; plan for total of 10-day course; completed 10/13 - Repeat CT A/P performed 10/16/2023 showing unchanged appendix, 1.1 cm in caliber   Acute upper GI bleed Acute esophageal necrosis Acute blood loss anemia Patient has h/o PUD, gastric ulcer perforation s/p sx lap 07/2023. Now s/p EGD showing grade D esophagitis, black discoloration mucosa in esophagus concerning for acute esophageal necrosis, portal hypertensive gastropathy, non bleeding gastric ulcer with clean ulcer base with apparent  sutures.  No bleeding duodenal ulcers with a clean ulcer base.  No evidence of active bleeding or varices.   Transferred to [facility] given findings concerning for acute esophageal necrosis Gastrin level wnl  GI recommending carafate  x14 days, BID PPI until follow up with GI primary at [facility].    Abdominal Pain - improved  Suspect related to abdominal distension from ascites +/- possible appendicitis  Repeat CT scan as noted    Orthostatic Hypotension Currently on Midodrine  and BP parameters remain borderline low.    Right Lower Extremity Edema Right Lower Extremity Pain LE US  - no evidence of deep vein thrombosis in lower extremity CT with moderate generalized  subcutaneous edema throughout both visualized size, similar to recent pelvic CT (anasarca/third spacing vs cellulitis) She's on abx which would cover cellulitis, though lower suspicion for this Pain radiating all down leg, ? Neuropathic pain Continue gabapentin     Hypervolemic Hyponatremia Sodium at 125 today. From hypervolemia from Cirrhosis.    Hypomagnesemia  Hypophosphatemia  Hypokalemia Replaced, follow up intermittently.    Tobacco abuse Continue nicotine  patch.   Alcohol abuse Continue thiamine , folate, MVT.   Severe Protein Calorie Malnutrition Body mass index is 28.95 kg/m RD   Patient continued to decline with poor oral intake, increased ammonia levels, worsening hyponatremia, anemic, thrombocytopenic, recommend palliative care consult for goals of care with the rest of the family .     RN Pressure Injury Documentation: Wound 10/15/23 0900 Pressure Injury Sacrum Mid Stage 2 -  Partial thickness loss of dermis presenting as a shallow open injury with a red, pink wound bed without slough. (Active)    Malnutrition Type:  Nutrition Problem: Severe Malnutrition Etiology: chronic illness (cirrhosis)   Malnutrition Characteristics:  Signs/Symptoms: severe fat depletion, severe muscle depletion   Nutrition Interventions:  Interventions: Ensure Enlive (each supplement provides 350kcal and 20 grams of protein), MVI, Liberalize Diet, Magic cup  Estimated body mass index is 23.91 kg/m as calculated from the following:   Height as of this encounter: 5' 2 (1.575 m).   Weight as of this encounter: 59.3 kg.  Code Status: full code.  DVT Prophylaxis:  SCDs Start: 10/03/23 2153   Level of Care: Level of care: Progressive Family Communication:none at bedside.   Disposition Plan:     Remains inpatient appropriate:  pending clinical improvement.   Procedures:  Paracentesis   Consultants:   Palliative care Gastroenterology.  Nephrology.  Antimicrobials:    Anti-infectives (From admission, onward)    Start     Dose/Rate Route Frequency Ordered Stop   10/12/23 1000  metroNIDAZOLE  (FLAGYL ) tablet 500 mg        500 mg Oral Every 12 hours 10/12/23 0734 10/22/23 2125   10/12/23 0830  cefTRIAXone  (ROCEPHIN ) 2 g in sodium chloride  0.9 % 100 mL IVPB        2 g 200 mL/hr over 30 Minutes Intravenous Every 24 hours 10/12/23 0734 10/22/23 1636   10/07/23 1000  fluconazole  (DIFLUCAN ) tablet 100 mg  Status:  Discontinued        100 mg Oral Daily 10/07/23 0853 10/07/23 0855   10/07/23 1000  fluconazole  (DIFLUCAN ) tablet 150 mg        150 mg Oral Daily 10/07/23 0855 10/07/23 1014   10/04/23 1000  cefTRIAXone  (ROCEPHIN ) 2 g in sodium chloride  0.9 % 100 mL IVPB        2 g 200 mL/hr over 30 Minutes Intravenous Every 24 hours 10/03/23 2154 10/08/23 0854   10/03/23  2015  cefTRIAXone  (ROCEPHIN ) 1 g in sodium chloride  0.9 % 100 mL IVPB        1 g 200 mL/hr over 30 Minutes Intravenous  Once 10/03/23 2010 10/03/23 2212        Medications  Scheduled Meds:  calcium  carbonate  1 tablet Oral TID WC   Chlorhexidine  Gluconate Cloth  6 each Topical Daily   feeding supplement  237 mL Oral TID BM   folic acid   1 mg Oral Daily   gabapentin   300 mg Oral TID   lactulose  30 g Oral TID   lidocaine   2 patch Transdermal Q24H   midodrine   15 mg Oral Q8H   multivitamin with minerals  1 tablet Oral Daily   nicotine   21 mg Transdermal Daily   nutrition supplement (JUVEN)  1 packet Oral BID BM   pantoprazole   40 mg Oral BID   sucralfate   1 g Oral Q6H   thiamine   100 mg Oral Daily   Continuous Infusions: PRN Meds:.albuterol , alum & mag hydroxide-simeth, artificial tears, diphenhydrAMINE , Muscle Rub, ondansetron  (ZOFRAN ) IV, mouth rinse, oxyCODONE , phenol, prochlorperazine, simethicone , witch hazel-glycerin    Subjective:   Keirstan Iannello was seen and examined today.  Pt is oriented to place and person only.  She remains confused.   Objective:   Vitals:    10/25/23 0334 10/25/23 0500 10/25/23 0823 10/25/23 1301  BP: (!) 102/57  109/63 (!) 91/56  Pulse: 94  88 93  Resp: 16  18   Temp: 98.2 F (36.8 C)  98.2 F (36.8 C) 98.3 F (36.8 C)  TempSrc: Oral   Oral  SpO2: 99%  100% 99%  Weight:  59.3 kg    Height:        Intake/Output Summary (Last 24 hours) at 10/25/2023 1315 Last data filed at 10/25/2023 0600 Gross per 24 hour  Intake 340 ml  Output 650 ml  Net -310 ml   Filed Weights   10/21/23 0500 10/24/23 0500 10/25/23 0500  Weight: 58 kg 55.4 kg 59.3 kg     Exam General exam:Ill appearing lady jaundiced.  Respiratory system: Air entry fair, on RA.  Cardiovascular system: S1 & S2 heard, RRR.  Gastrointestinal system: Abdomen is soft, mildly distended.  Central nervous system: alert, oriented to person and place only. Confused.  Extremities: no cyanosis.  Skin: Jaundiced.  Psychiatry: pleasantly confused.    Data Reviewed:  I have personally reviewed following labs and imaging studies   CBC Lab Results  Component Value Date   WBC 13.2 (H) 10/25/2023   RBC 2.28 (L) 10/25/2023   HGB 7.5 (L) 10/25/2023   HCT 22.0 (L) 10/25/2023   MCV 96.5 10/25/2023   MCH 32.9 10/25/2023   PLT 140 (L) 10/25/2023   MCHC 34.1 10/25/2023   RDW 22.0 (H) 10/25/2023   LYMPHSABS 1.7 10/25/2023   MONOABS 1.9 (H) 10/25/2023   EOSABS 0.1 10/25/2023   BASOSABS 0.1 10/25/2023     Last metabolic panel Lab Results  Component Value Date   NA 122 (L) 10/25/2023   K 4.3 10/25/2023   CL 92 (L) 10/25/2023   CO2 20 (L) 10/25/2023   BUN 29 (H) 10/25/2023   CREATININE 0.55 10/25/2023   GLUCOSE 99 10/25/2023   GFRNONAA >60 10/25/2023   CALCIUM  9.2 10/25/2023   PHOS 2.6 10/17/2023   PROT 5.9 (L) 10/25/2023   ALBUMIN  3.0 (L) 10/25/2023   LABGLOB 4.4 07/30/2023   AGRATIO 1.2 03/28/2022   BILITOT 4.8 (H) 10/25/2023  ALKPHOS 64 10/25/2023   AST 77 (H) 10/25/2023   ALT 15 10/25/2023   ANIONGAP 10 10/25/2023    CBG (last 3)  Recent Labs     10/25/23 0015 10/25/23 0337 10/25/23 1215  GLUCAP 111* 108* 99      Coagulation Profile: Recent Labs  Lab 10/21/23 0559 10/22/23 0150 10/23/23 0140 10/24/23 0308 10/25/23 0215  INR 2.9* 2.7* 2.6* 2.4* 2.5*     Radiology Studies: No results found.      Elgie Butter M.D. [facility] 10/25/2023, 1:15 PM  Available via Epic secure chat 7am-7pm After 7 pm, please refer to night coverage provider listed on amion.

## 2023-10-25 NOTE — Progress Notes (Signed)
 Palliative Medicine Inpatient Follow Up Note   HPI: 53 y.o. female  with past medical history of  s/p exploratory laparotomy for perforated gastric ulcer repair with omental patch on 7/27, ongoing alcohol use disorder, tobacco abuse, cirrhosis, HTN, GERD, anxiety/depression, multiple hospitalizations,  admitted on 10/03/2023 at [facility] with complaints of nausea, vomiting and coffee-ground emesis, symptoms ongoing for last 2 days, she reports last alcoholic beverage she had Monday or Sunday, for nausea, vomiting and diarrhea has been ongoing for last 2 days, and it turned today into coffee-ground emesis. Later (10/04/2023) transferred to [facility] ICU for further management. Now status post upper endoscopy, showing black discoloration of esophageal mucosa concerning for acute esophageal necrosis, portal hypertension, non-bleeding gastric and duodenal ulcers.    Paracentesis on 10/15/2023: 4.0 liters of ascitic fluid removed. Paracentesis on 10/22/2023: 3.3 liters of ascitic fluid removed.   Worth to note that this is her 7 inpatient admissions, and has 4 ED encounter. Her most recent hospital stay was in August 2025 due to GI bleed and abdominal pain.    PMT has been consulted to assist with goals of care conversation. Patient/Family face treatment option decisions, advanced directive decisions and anticipatory care needs.    Family face treatment option decision, advance directive decisions and anticipatory care needs.    Today's Discussion 10/25/2023  *Please note that this is a verbal dictation therefore any spelling or grammatical errors are due to the Dragon Medical One system interpretation.  Chart reviewed inclusive of vital signs, progress notes, laboratory results, and diagnostic images.  Ammonia level increasing from 62 last 10/15/23 to 78 10/20/2023 then 88 on 10/24/2023.   Visited patient at bedside today, accompanied by her friend Building control surveyor and PMT Elia Czar. Patient appeared newly  awake, uncomfortable, and not well settled in bed. She reported ongoing buttocks/rectal pain related to the flexiseal device. While alert, her orientation was limited, with noted periods of blank stares and difficulty finding words to express her needs.  The plan for today was to review the advance directive. Chaplain Czar began educating the patient on the document, but the discussion was temporarily paused due to the patient's distractibility and inability to maintain focus. Jarratt remained at the bedside to offer comfort and support.  Patient and friend [name] shared that patient was able to get in touch/reconnected with family and relatives yesterday. [name] shared that patient and Duwaine  got in touch, and that Duwaine was able to discuss and get info/update from attending MD regarding patient's status.   At 4:15 PM, I spoke with the patient's daughter, Duwaine, to review her mother's current medical condition. Duwaine shared that she had a conversation with the attending physician yesterday and was feeling overwhelmed by the information. She understands that her mother is critically ill with advanced liver failure and likely has limited time. I explained the patient's continued clinical decline, including poor oral intake, elevated ammonia levels, worsening hyponatremia, anemia, and thrombocytopenia, and expressed concern about her overall health.  We discussed goals of care, code status, and available treatment options. I informed Duwaine that Jazzlene had previously in several occassions identified her as the decision maker should the patient lose decision-making capacity, which Duwaine acknowledged. I reviewed the implications of the current full code status and encouraged consideration of DNR/DNI, explaining that such a status does not alter the medical plan but serves as a protective measure in the event of cardiopulmonary arrest, especially in the context of advanced chronic illness.  We also discussed  comfort-focused care  and hospice pathways, including various hospice settings. Duwaine was emotional, tearful, and appreciative of the information. She expressed a need for time to process everything but recognized the urgency of decision-making. She plans to visit the patient over the weekend. Goals of care remains unchanged.   Patient face treatment option decisions, advanced directive decisions and anticipatory care needs.   Questions and concerns addressed   Palliative Support Provided.   Objective Assessment:  Physical Exam Constitutional:      Appearance: chronically-ill, fatigued-looking, flexiseal in place. HENT:     Head: Normocephalic and atraumatic. :  Cardiovascular:     Normal rate Pulmonary:     Effort: Pulmonary effort is normal. No respiratory distress.     Breath sounds: Normal breath sounds. No wheezing.  Abdominal:     General: Bowel sounds are normal. There is distension.     Palpations: Abdomen is soft.     Tenderness: positive for abdominal ternderness Musculoskeletal:        General: Normal range of motion.  Skin:    General: Skin is warm and dry.  Neurological:     General: No focal deficit present.     Mental Status: She is alert.    Vital Signs Vitals:   10/25/23 1301 10/25/23 1546  BP: (!) 91/56 (!) 103/55  Pulse: 93 (!) 103  Resp:  18  Temp: 98.3 F (36.8 C) 98.6 F (37 C)  SpO2: 99% 100%    Intake/Output Summary (Last 24 hours) at 10/25/2023 1555 Last data filed at 10/25/2023 0600 Gross per 24 hour  Intake 340 ml  Output 650 ml  Net -310 ml   Last Weight  Most recent update: 10/25/2023  5:26 AM    Weight  59.3 kg (130 lb 11.7 oz)                SUMMARY OF RECOMMENDATIONS   Code Status: Maintain Full Code Continue current scope of care Goal of care is medical stabilization and recovery to the extent this is possible The patient was encouraged to initiate open communication with her children regarding her current medical  condition, including both her short- and long-term GOC. Spiritual care consult to assist with AD creation. I reached out to the patient's daughter, Megan, and discussed goals of care and treatment options, including hospice. She expressed feeling overwhelmed and requested more time to process the information before making any decisions. Continue to provide psycho-social and emotional support to patient and family Palliative medicine team will continue to follow.   Symptom Management: Per Primary team Palliative medicine is available to assist as needed.      Time Spent: 50 minutes  Detailed review of medical records (labs, imaging, vital signs), medically appropriate exam, discussed with treatment team, counseling and education to patient, family, & staff, documenting clinical information, coordination of care.   ______________________________________________________________________________________ Kathlyne Bolder NP-C [facility] Palliative Medicine Team Team Cell Phone: [phone] Please utilize secure chat with additional questions, if there is no response within 30 minutes please call the above phone number  Palliative Medicine Team providers are available by phone from 7am to 7pm daily and can be reached through the team cell phone.  Should this patient require assistance outside of these hours, please call the patient's attending physician.

## 2023-10-25 NOTE — Plan of Care (Signed)

## 2023-10-26 ENCOUNTER — Inpatient Hospital Stay (HOSPITAL_COMMUNITY)

## 2023-10-26 DIAGNOSIS — Z7189 Other specified counseling: Secondary | ICD-10-CM | POA: Diagnosis not present

## 2023-10-26 DIAGNOSIS — K7290 Hepatic failure, unspecified without coma: Secondary | ICD-10-CM | POA: Diagnosis not present

## 2023-10-26 DIAGNOSIS — E43 Unspecified severe protein-calorie malnutrition: Secondary | ICD-10-CM

## 2023-10-26 DIAGNOSIS — E871 Hypo-osmolality and hyponatremia: Secondary | ICD-10-CM | POA: Diagnosis not present

## 2023-10-26 DIAGNOSIS — K7031 Alcoholic cirrhosis of liver with ascites: Secondary | ICD-10-CM | POA: Diagnosis not present

## 2023-10-26 LAB — COMPREHENSIVE METABOLIC PANEL WITH GFR
ALT: 17 U/L (ref 0–44)
AST: 86 U/L — ABNORMAL HIGH (ref 15–41)
Albumin: 3 g/dL — ABNORMAL LOW (ref 3.5–5.0)
Alkaline Phosphatase: 70 U/L (ref 38–126)
Anion gap: 11 (ref 5–15)
BUN: 25 mg/dL — ABNORMAL HIGH (ref 6–20)
CO2: 20 mmol/L — ABNORMAL LOW (ref 22–32)
Calcium: 9.1 mg/dL (ref 8.9–10.3)
Chloride: 90 mmol/L — ABNORMAL LOW (ref 98–111)
Creatinine, Ser: 0.71 mg/dL (ref 0.44–1.00)
GFR, Estimated: >60 mL/min (ref >60)
Glucose, Bld: 90 mg/dL (ref 70–99)
Potassium: 4.1 mmol/L (ref 3.5–5.1)
Sodium: 121 mmol/L — ABNORMAL LOW (ref 135–145)
Total Bilirubin: 5 mg/dL — ABNORMAL HIGH (ref 0.0–1.2)
Total Protein: 6.2 g/dL — ABNORMAL LOW (ref 6.5–8.1)

## 2023-10-26 LAB — CBC WITH DIFFERENTIAL/PLATELET
Abs Immature Granulocytes: 0.18 K/uL — ABNORMAL HIGH (ref 0.00–0.07)
Basophils Absolute: 0.1 K/uL (ref 0.0–0.1)
Basophils Relative: 0 %
Eosinophils Absolute: 0.1 K/uL (ref 0.0–0.5)
Eosinophils Relative: 0 %
HCT: 22.7 % — ABNORMAL LOW (ref 36.0–46.0)
Hemoglobin: 7.9 g/dL — ABNORMAL LOW (ref 12.0–15.0)
Immature Granulocytes: 1 %
Lymphocytes Relative: 9 %
Lymphs Abs: 1.3 K/uL (ref 0.7–4.0)
MCH: 33.5 pg (ref 26.0–34.0)
MCHC: 34.8 g/dL (ref 30.0–36.0)
MCV: 96.2 fL (ref 80.0–100.0)
Monocytes Absolute: 1.7 K/uL — ABNORMAL HIGH (ref 0.1–1.0)
Monocytes Relative: 11 %
Neutro Abs: 11.3 K/uL — ABNORMAL HIGH (ref 1.7–7.7)
Neutrophils Relative %: 79 %
Platelets: 155 K/uL (ref 150–400)
RBC: 2.36 MIL/uL — ABNORMAL LOW (ref 3.87–5.11)
RDW: 22 % — ABNORMAL HIGH (ref 11.5–15.5)
WBC: 14.5 K/uL — ABNORMAL HIGH (ref 4.0–10.5)
nRBC: 0 % (ref 0.0–0.2)

## 2023-10-26 LAB — GLUCOSE, CAPILLARY
Glucose-Capillary: 93 mg/dL (ref 70–99)
Glucose-Capillary: 106 mg/dL — ABNORMAL HIGH (ref 70–99)
Glucose-Capillary: 121 mg/dL — ABNORMAL HIGH (ref 70–99)
Glucose-Capillary: 118 mg/dL — ABNORMAL HIGH (ref 70–99)
Glucose-Capillary: 91 mg/dL (ref 70–99)

## 2023-10-26 LAB — OSMOLALITY: Osmolality: 259 mosm/kg — ABNORMAL LOW (ref 275–295)

## 2023-10-26 LAB — BODY FLUID CULTURE W GRAM STAIN
Culture: NO GROWTH
Gram Stain: NONE SEEN

## 2023-10-26 LAB — OSMOLALITY, URINE: Osmolality, Ur: 433 mosm/kg (ref 300–900)

## 2023-10-26 LAB — PROTIME-INR
INR: 2.3 — ABNORMAL HIGH (ref 0.8–1.2)
Prothrombin Time: 26.3 s — ABNORMAL HIGH (ref 11.4–15.2)

## 2023-10-26 LAB — AMMONIA: Ammonia: 29 umol/L (ref 9–35)

## 2023-10-26 LAB — MAGNESIUM: Magnesium: 1.7 mg/dL (ref 1.7–2.4)

## 2023-10-26 NOTE — Plan of Care (Signed)

## 2023-10-26 NOTE — Progress Notes (Signed)
 [facility]                                                                               Joan Wood, is a 53 y.o. female, DOB - Feb 11, 1970, FMW:[id number] Admit date - 10/03/2023    Outpatient Primary MD for the patient is Bacchus, Meade PEDLAR, FNP  LOS - 23  days    Brief summary  Joan Wood is a 53 year old female with decompensated cirrhosis, PUD with recent gastric ulcer perforation s/p ex-lap, GERD, pancreatitis, OSA who presented to [facility] with N/V/bloody diarrhea x 4 days.    She had coffee ground emesis in the ED. CTA GI neg for acute bleed, large ascites and new distal esophageal thickening.    GI consulted for acute blood loss anemia with Hg 8.3>7. She was given PPI, started on octreotide  gtt, rocephin , vitamin K , pain and nausea control and admitted to hospitalist.    Now s/p upper endoscopy showing black discoloration of esophageal mucosa concerning for acute esophageal necrosis, portal hypertension, non-bleeding gastric and duodenal ulcers.    Transferred to [facility] ICU for further management.    Significant Events 9/24: ED for upper GI bleed admit to hospitalist  9/25: 1 U PRBC, GI upper endoscopy w/ acute esophageal necrosis with recommendation for transfer to [facility] > NPO, s/p paracentesis 3.35L drained Transferred to hospitalists service 9/27 GI signed off 9/30 10/1 US  without sufficient ascites to tap 10/3 CT with findings concerning for appendicitis, surgery recommending antibiotics (high risk for surgery).  GI reconsulted to to progressive liver failure.  10/4 diagnostic para without evidence SBP 10/5 diuretics held with doubling creatinine 10/6 para with 4 L off.  Renal consult for progressive AKI. 10/15 pt pleasantly confused, , has rectal tube and foley catheter.  10/17 palliative care on board, and goc discussions are on going .     Assessment & Plan    Assessment and Plan:   Decompensated Alcoholic Cirrhosis Hepatic Encephalopathy -  improved Ascites  Bilateral Pleural Effusions  Hypoalbuminemia  Thrombocytopenia  - S/p 5 days ceftriaxone  (now on abx with appendicitis below) -Completed trial of NAC as well - s/p paracentesis as needed; last on 10/6, 10/13 . - still remains fluid overloaded, abdomen is distended, one dose of lasix ordered.  - poor oral intake.  - palliative care consulted for GOC discussions as prognosis is poor and MELD Na Score remains high.  - patient continues to decline, more confused despite 30 g of lactulose TID, adding rifaximin.  - ammonia level elevated at 88.    AKI - presumed ATN 2/2 contrast - not an HD candidate - nephrology signed off. - voiding trial when more stable.    Appendicitis - Appreciate gen surgery, she's high risk for surgery in setting of her cirrhosis - s/p Rocephin  and Flagyl ; plan for total of 10-day course; completed 10/13 - Repeat CT A/P performed 10/16/2023 showing unchanged appendix, 1.1 cm in caliber   Acute upper GI bleed Acute esophageal necrosis Acute blood loss anemia Patient has h/o PUD, gastric ulcer perforation s/p sx lap 07/2023. Now s/p EGD showing grade D esophagitis, black discoloration mucosa in esophagus concerning for acute esophageal necrosis,  portal hypertensive gastropathy, non bleeding gastric ulcer with clean ulcer base with apparent sutures.  No bleeding duodenal ulcers with a clean ulcer base.  No evidence of active bleeding or varices.   Transferred to [facility] given findings concerning for acute esophageal necrosis Gastrin level wnl  GI recommending carafate  x14 days, BID PPI until follow up with GI primary at [facility].    Abdominal Pain - improved  Suspect related to abdominal distension from ascites +/- possible appendicitis  Repeat CT scan as noted    Orthostatic Hypotension Currently on Midodrine  and BP parameters remain borderline low.    Right Lower Extremity Edema Right Lower Extremity Pain LE US  - no evidence of deep vein  thrombosis in lower extremity CT with moderate generalized subcutaneous edema throughout both visualized size, similar to recent pelvic CT (anasarca/third spacing vs cellulitis) She's on abx which would cover cellulitis, though lower suspicion for this Pain radiating all down leg, ? Neuropathic pain Continue gabapentin     Hypervolemic Hyponatremia Sodium slowly decreasing at 121. From hypervolemia.    Hypomagnesemia  Hypophosphatemia  Hypokalemia Replaced, follow up intermittently.    Tobacco abuse Continue nicotine  patch.   Alcohol abuse Continue thiamine , folate, MVT.   Severe Protein Calorie Malnutrition Body mass index is 28.95 kg/m RD   Patient continued to decline with poor oral intake, increased ammonia levels, worsening hyponatremia, anemic, thrombocytopenic, recommend palliative care consult for goals of care with the rest of the family .     RN Pressure Injury Documentation: Wound 10/15/23 0900 Pressure Injury Sacrum Mid Stage 2 -  Partial thickness loss of dermis presenting as a shallow open injury with a red, pink wound bed without slough. (Active)    Malnutrition Type:  Nutrition Problem: Severe Malnutrition Etiology: chronic illness (cirrhosis)   Malnutrition Characteristics:  Signs/Symptoms: severe fat depletion, severe muscle depletion   Nutrition Interventions:  Interventions: Ensure Enlive (each supplement provides 350kcal and 20 grams of protein), MVI, Liberalize Diet, Magic cup  Estimated body mass index is 23.91 kg/m as calculated from the following:   Height as of this encounter: 5' 2 (1.575 m).   Weight as of this encounter: 59.3 kg.  Code Status: full code.  DVT Prophylaxis:  SCDs Start: 10/03/23 2153   Level of Care: Level of care: Progressive Family Communication:none at bedside.   Disposition Plan:     Remains inpatient appropriate:  pending clinical improvement.   Procedures:  Paracentesis   Consultants:   Palliative  care Gastroenterology.  Nephrology.  Antimicrobials:   Anti-infectives (From admission, onward)    Start     Dose/Rate Route Frequency Ordered Stop   10/25/23 1415  rifaximin (XIFAXAN) tablet 550 mg        550 mg Oral 2 times daily 10/25/23 1315     10/12/23 1000  metroNIDAZOLE  (FLAGYL ) tablet 500 mg        500 mg Oral Every 12 hours 10/12/23 0734 10/22/23 2125   10/12/23 0830  cefTRIAXone  (ROCEPHIN ) 2 g in sodium chloride  0.9 % 100 mL IVPB        2 g 200 mL/hr over 30 Minutes Intravenous Every 24 hours 10/12/23 0734 10/22/23 1636   10/07/23 1000  fluconazole  (DIFLUCAN ) tablet 100 mg  Status:  Discontinued        100 mg Oral Daily 10/07/23 0853 10/07/23 0855   10/07/23 1000  fluconazole  (DIFLUCAN ) tablet 150 mg        150 mg Oral Daily 10/07/23 0855 10/07/23 1014   10/04/23  1000  cefTRIAXone  (ROCEPHIN ) 2 g in sodium chloride  0.9 % 100 mL IVPB        2 g 200 mL/hr over 30 Minutes Intravenous Every 24 hours 10/03/23 2154 10/08/23 0854   10/03/23 2015  cefTRIAXone  (ROCEPHIN ) 1 g in sodium chloride  0.9 % 100 mL IVPB        1 g 200 mL/hr over 30 Minutes Intravenous  Once 10/03/23 2010 10/03/23 2212        Medications  Scheduled Meds:  calcium  carbonate  1 tablet Oral TID WC   Chlorhexidine  Gluconate Cloth  6 each Topical Daily   feeding supplement  237 mL Oral TID BM   folic acid   1 mg Oral Daily   gabapentin   300 mg Oral TID   lactulose  30 g Oral TID   lidocaine   2 patch Transdermal Q24H   midodrine   15 mg Oral Q8H   multivitamin with minerals  1 tablet Oral Daily   nicotine   21 mg Transdermal Daily   nutrition supplement (JUVEN)  1 packet Oral BID BM   pantoprazole   40 mg Oral BID   rifaximin  550 mg Oral BID   sucralfate   1 g Oral Q6H   thiamine   100 mg Oral Daily   Continuous Infusions: PRN Meds:.albuterol , alum & mag hydroxide-simeth, artificial tears, diphenhydrAMINE , Muscle Rub, ondansetron  (ZOFRAN ) IV, mouth rinse, oxyCODONE , phenol, prochlorperazine,  simethicone , witch hazel-glycerin    Subjective:   Kataya Guimont was seen and examined today.  Pt oriented, teary a bit. No chest pain or sob.    Objective:   Vitals:   10/26/23 0337 10/26/23 0817 10/26/23 1132 10/26/23 1604  BP: 106/65 110/77 99/63 (!) 92/58  Pulse: 99 99 82 94  Resp: 18 18 18 16   Temp: 98.8 F (37.1 C) 98.1 F (36.7 C) 98.6 F (37 C) 98.3 F (36.8 C)  TempSrc: Oral Oral Oral Oral  SpO2: 100% 98% 100% 100%  Weight:      Height:        Intake/Output Summary (Last 24 hours) at 10/26/2023 1628 Last data filed at 10/26/2023 0326 Gross per 24 hour  Intake 330 ml  Output 850 ml  Net -520 ml   Filed Weights   10/21/23 0500 10/24/23 0500 10/25/23 0500  Weight: 58 kg 55.4 kg 59.3 kg     Exam General exam:ill appearing lady, not in distress.  Respiratory system: Clear to auscultation. Respiratory effort normal. Cardiovascular system: S1 & S2 heard, RRR. No JVD, Gastrointestinal system: Abdomen is soft, mildly distended. Bs+ remains on Rectal tube.  Central nervous system: Alert and oriented. Severe memory deficits.  Extremities: no pedal edema.  Skin: Jaundiced. No rashes.  Psychiatry: Labile mood.    Data Reviewed:  I have personally reviewed following labs and imaging studies   CBC Lab Results  Component Value Date   WBC 14.5 (H) 10/26/2023   RBC 2.36 (L) 10/26/2023   HGB 7.9 (L) 10/26/2023   HCT 22.7 (L) 10/26/2023   MCV 96.2 10/26/2023   MCH 33.5 10/26/2023   PLT 155 10/26/2023   MCHC 34.8 10/26/2023   RDW 22.0 (H) 10/26/2023   LYMPHSABS 1.3 10/26/2023   MONOABS 1.7 (H) 10/26/2023   EOSABS 0.1 10/26/2023   BASOSABS 0.1 10/26/2023     Last metabolic panel Lab Results  Component Value Date   NA 121 (L) 10/26/2023   K 4.1 10/26/2023   CL 90 (L) 10/26/2023   CO2 20 (L) 10/26/2023   BUN 25 (H)  10/26/2023   CREATININE 0.71 10/26/2023   GLUCOSE 90 10/26/2023   GFRNONAA >60 10/26/2023   CALCIUM  9.1 10/26/2023   PHOS 2.6  10/17/2023   PROT 6.2 (L) 10/26/2023   ALBUMIN  3.0 (L) 10/26/2023   LABGLOB 4.4 07/30/2023   AGRATIO 1.2 03/28/2022   BILITOT 5.0 (H) 10/26/2023   ALKPHOS 70 10/26/2023   AST 86 (H) 10/26/2023   ALT 17 10/26/2023   ANIONGAP 11 10/26/2023    CBG (last 3)  Recent Labs    10/26/23 0816 10/26/23 1129 10/26/23 1604  GLUCAP 106* 121* 118*      Coagulation Profile: Recent Labs  Lab 10/22/23 0150 10/23/23 0140 10/24/23 0308 10/25/23 0215 10/26/23 0515  INR 2.7* 2.6* 2.4* 2.5* 2.3*     Radiology Studies: No results found.      Elgie Butter M.D. [facility] 10/26/2023, 4:28 PM  Available via Epic secure chat 7am-7pm After 7 pm, please refer to night coverage provider listed on amion.

## 2023-10-26 NOTE — Progress Notes (Signed)
 Physical Therapy Treatment Patient Details Name: Joan Wood MRN: [id number] DOB: 06-15-1970 Today's Date: 10/26/2023   History of Present Illness 53 y.o. female presents to [facility] hospital on 10/03/2023 with nausea/vomiting and bloody diarrhea. Pt underwent upper EGD on 9/25, concerning for possible acute esophageal necrosis. Pt also underwent paracentesis on 9/25 and 10/6. PMH includes decompensated cirrhosis, PUD, GERD, pancreatitis, OSA.    PT Comments  Pt received in supine and agreeable to session. Pt able to sit to EOB with min A and tolerate extended time sitting EOB without dizziness. Pt able to stand x2 and take a few lateral steps, but demonstrates limited tolerance due to dizziness and trembling. Pt requires increased time and cues for mobility tasks due to pain and weakness. Pt continues to benefit from PT services to progress toward functional mobility goals.   BP supine: 98/75 Sitting EOB: 103/62 Sitting after 1 standing trial: 102/67 Sitting after 2 standing trial: 102/59   If plan is discharge home, recommend the following: A lot of help with walking and/or transfers;A lot of help with bathing/dressing/bathroom;Assistance with cooking/housework;Assist for transportation;Help with stairs or ramp for entrance   Can travel by private vehicle     No  Equipment Recommendations  Hospital bed;BSC/3in1    Recommendations for Other Services       Precautions / Restrictions Precautions Precautions: Fall Recall of Precautions/Restrictions: Impaired Precaution/Restrictions Comments: orthostatic, foley, flexiseal Restrictions Weight Bearing Restrictions Per Provider Order: No     Mobility  Bed Mobility Overal bed mobility: Needs Assistance Bed Mobility: Supine to Sit, Sit to Supine     Supine to sit: Min assist, HOB elevated, Used rails Sit to supine: Mod assist   General bed mobility comments: Assist for BLE advancement to EOB, trunk elevation, and BLE elevation back to  EOB    Transfers Overall transfer level: Needs assistance Equipment used: Rolling walker (2 wheels) Transfers: Sit to/from Stand Sit to Stand: Min assist           General transfer comment: STS from EOB with min A for power up. Pt able to take a few lateral steps towards [facility] with CGA    Ambulation/Gait               General Gait Details: unable due to dizziness   Stairs             Wheelchair Mobility     Tilt Bed    Modified Rankin (Stroke Patients Only)       Balance Overall balance assessment: Needs assistance Sitting-balance support: No upper extremity supported, Feet supported, Bilateral upper extremity supported Sitting balance-Leahy Scale: Fair Sitting balance - Comments: CGA sitting EOB statically   Standing balance support: Bilateral upper extremity supported, Reliant on assistive device for balance, During functional activity Standing balance-Leahy Scale: Poor Standing balance comment: with RW support                            Communication Communication Communication: No apparent difficulties  Cognition Arousal: Alert Behavior During Therapy: Lability   PT - Cognitive impairments: No family/caregiver present to determine baseline, Awareness, Memory, Attention, Sequencing, Problem solving, Safety/Judgement                       PT - Cognition Comments: Pt requires frequent redirection due to impaired attention Following commands: Impaired Following commands impaired: Follows multi-step commands inconsistently, Follows one step commands with increased time  Cueing Cueing Techniques: Verbal cues, Visual cues, Tactile cues  Exercises      General Comments        Pertinent Vitals/Pain Pain Assessment Pain Assessment: Faces Faces Pain Scale: Hurts whole lot Pain Location: anus (from flexiseal) Pain Descriptors / Indicators: Discomfort, Sore, Grimacing Pain Intervention(s): Limited activity within patient's  tolerance, Monitored during session, Repositioned     PT Goals (current goals can now be found in the care plan section) Acute Rehab PT Goals Patient Stated Goal: to return to independence PT Goal Formulation: With patient Time For Goal Achievement: 11/02/23 Progress towards PT goals: Progressing toward goals    Frequency    Min 2X/week       AM-PAC PT 6 Clicks Mobility   Outcome Measure  Help needed turning from your back to your side while in a flat bed without using bedrails?: A Little Help needed moving from lying on your back to sitting on the side of a flat bed without using bedrails?: A Little Help needed moving to and from a bed to a chair (including a wheelchair)?: A Little Help needed standing up from a chair using your arms (e.g., wheelchair or bedside chair)?: A Little Help needed to walk in hospital room?: Total Help needed climbing 3-5 steps with a railing? : Total 6 Click Score: 14    End of Session Equipment Utilized During Treatment: Gait belt Activity Tolerance: Other (comment) (limited by dizziness) Patient left: in bed;with call bell/phone within reach;with bed alarm set Nurse Communication: Mobility status PT Visit Diagnosis: Other abnormalities of gait and mobility (R26.89);Muscle weakness (generalized) (M62.81)     Time: 8789-8750 PT Time Calculation (min) (ACUTE ONLY): 39 min  Charges:    $Therapeutic Activity: 38-52 mins PT General Charges $$ ACUTE PT VISIT: 1 Visit                     Darryle George, PTA Acute Rehabilitation Services Secure Chat Preferred  [phone]    Darryle George 10/26/2023, 2:00 PM

## 2023-10-26 NOTE — Plan of Care (Signed)

## 2023-10-27 DIAGNOSIS — Z7189 Other specified counseling: Secondary | ICD-10-CM | POA: Diagnosis not present

## 2023-10-27 DIAGNOSIS — K7031 Alcoholic cirrhosis of liver with ascites: Secondary | ICD-10-CM | POA: Diagnosis not present

## 2023-10-27 DIAGNOSIS — E871 Hypo-osmolality and hyponatremia: Secondary | ICD-10-CM | POA: Diagnosis not present

## 2023-10-27 DIAGNOSIS — K7290 Hepatic failure, unspecified without coma: Secondary | ICD-10-CM | POA: Diagnosis not present

## 2023-10-27 LAB — CBC WITH DIFFERENTIAL/PLATELET
Abs Immature Granulocytes: 0.12 K/uL — ABNORMAL HIGH (ref 0.00–0.07)
Basophils Absolute: 0 K/uL (ref 0.0–0.1)
Basophils Relative: 0 %
Eosinophils Absolute: 0.1 K/uL (ref 0.0–0.5)
Eosinophils Relative: 1 %
HCT: 22.6 % — ABNORMAL LOW (ref 36.0–46.0)
Hemoglobin: 8 g/dL — ABNORMAL LOW (ref 12.0–15.0)
Immature Granulocytes: 1 %
Lymphocytes Relative: 11 %
Lymphs Abs: 1.2 K/uL (ref 0.7–4.0)
MCH: 33.5 pg (ref 26.0–34.0)
MCHC: 35.4 g/dL (ref 30.0–36.0)
MCV: 94.6 fL (ref 80.0–100.0)
Monocytes Absolute: 1.5 K/uL — ABNORMAL HIGH (ref 0.1–1.0)
Monocytes Relative: 13 %
Neutro Abs: 8.1 K/uL — ABNORMAL HIGH (ref 1.7–7.7)
Neutrophils Relative %: 74 %
Platelets: 168 K/uL (ref 150–400)
RBC: 2.39 MIL/uL — ABNORMAL LOW (ref 3.87–5.11)
RDW: 21.8 % — ABNORMAL HIGH (ref 11.5–15.5)
Smear Review: NORMAL
WBC: 11 K/uL — ABNORMAL HIGH (ref 4.0–10.5)
nRBC: 0 % (ref 0.0–0.2)

## 2023-10-27 LAB — COMPREHENSIVE METABOLIC PANEL WITH GFR
ALT: 16 U/L (ref 0–44)
AST: 87 U/L — ABNORMAL HIGH (ref 15–41)
Albumin: 2.8 g/dL — ABNORMAL LOW (ref 3.5–5.0)
Alkaline Phosphatase: 59 U/L (ref 38–126)
Anion gap: 11 (ref 5–15)
BUN: 18 mg/dL (ref 6–20)
CO2: 21 mmol/L — ABNORMAL LOW (ref 22–32)
Calcium: 8.9 mg/dL (ref 8.9–10.3)
Chloride: 91 mmol/L — ABNORMAL LOW (ref 98–111)
Creatinine, Ser: 0.56 mg/dL (ref 0.44–1.00)
GFR, Estimated: >60 mL/min (ref >60)
Glucose, Bld: 101 mg/dL — ABNORMAL HIGH (ref 70–99)
Potassium: 4.6 mmol/L (ref 3.5–5.1)
Sodium: 123 mmol/L — ABNORMAL LOW (ref 135–145)
Total Bilirubin: 5.7 mg/dL — ABNORMAL HIGH (ref 0.0–1.2)
Total Protein: 6.2 g/dL — ABNORMAL LOW (ref 6.5–8.1)

## 2023-10-27 LAB — GLUCOSE, CAPILLARY
Glucose-Capillary: 101 mg/dL — ABNORMAL HIGH (ref 70–99)
Glucose-Capillary: 106 mg/dL — ABNORMAL HIGH (ref 70–99)
Glucose-Capillary: 110 mg/dL — ABNORMAL HIGH (ref 70–99)
Glucose-Capillary: 112 mg/dL — ABNORMAL HIGH (ref 70–99)
Glucose-Capillary: 119 mg/dL — ABNORMAL HIGH (ref 70–99)
Glucose-Capillary: 124 mg/dL — ABNORMAL HIGH (ref 70–99)
Glucose-Capillary: 128 mg/dL — ABNORMAL HIGH (ref 70–99)

## 2023-10-27 LAB — PROTIME-INR
INR: 2.4 — ABNORMAL HIGH (ref 0.8–1.2)
Prothrombin Time: 27.7 s — ABNORMAL HIGH (ref 11.4–15.2)

## 2023-10-27 LAB — MAGNESIUM: Magnesium: 1.9 mg/dL (ref 1.7–2.4)

## 2023-10-27 MED ORDER — FUROSEMIDE 20 MG PO TABS
20.0000 mg | ORAL_TABLET | Freq: Every day | ORAL | Status: DC
  Administered 2023-10-27 – 2023-10-28 (×2): 20 mg via ORAL
  Filled 2023-10-27 (×2): qty 1

## 2023-10-27 NOTE — Progress Notes (Signed)
 [facility]                                                                               Joan Wood, is a 53 y.o. female, DOB - Apr 24, 1970, FMW:[id number] Admit date - 10/03/2023    Outpatient Primary MD for the patient is Bacchus, Meade PEDLAR, FNP  LOS - 24  days    Brief summary  Joan Wood is a 53 year old female with decompensated cirrhosis, PUD with recent gastric ulcer perforation s/p ex-lap, GERD, pancreatitis, OSA who presented to [facility] with N/V/bloody diarrhea x 4 days.    She had coffee ground emesis in the ED. CTA GI neg for acute bleed, large ascites and new distal esophageal thickening.    GI consulted for acute blood loss anemia with Hg 8.3>7. She was given PPI, started on octreotide  gtt, rocephin , vitamin K , pain and nausea control and admitted to hospitalist.    Now s/p upper endoscopy showing black discoloration of esophageal mucosa concerning for acute esophageal necrosis, portal hypertension, non-bleeding gastric and duodenal ulcers.    Transferred to [facility] ICU for further management.    Significant Events 9/24: ED for upper GI bleed admit to hospitalist  9/25: 1 U PRBC, GI upper endoscopy w/ acute esophageal necrosis with recommendation for transfer to [facility] > NPO, s/p paracentesis 3.35L drained Transferred to hospitalists service 9/27 GI signed off 9/30 10/1 US  without sufficient ascites to tap 10/3 CT with findings concerning for appendicitis, surgery recommending antibiotics (high risk for surgery).  GI reconsulted to to progressive liver failure.  10/4 diagnostic para without evidence SBP 10/5 diuretics held with doubling creatinine 10/6 para with 4 L off.  Renal consult for progressive AKI. 10/15 pt pleasantly confused, , has rectal tube and foley catheter.  10/17 palliative care on board, and goc discussions are on going .  No new changes today patient appears comfortable    Assessment & Plan    Assessment and Plan:   Decompensated  Alcoholic Cirrhosis Hepatic Encephalopathy - improved Ascites  Bilateral Pleural Effusions  Hypoalbuminemia  Thrombocytopenia  - S/p 5 days ceftriaxone  (now on abx with appendicitis below) -Completed trial of NAC as well - s/p paracentesis as needed; last on 10/6, 10/13 . - still remains fluid overloaded, abdomen is distended, one dose of lasix ordered.  - poor oral intake.  - palliative care consulted for GOC discussions as prognosis is poor and MELD Na Score remains high.  - patient continues to decline, more confused despite 30 g of lactulose TID, adding rifaximin.  - Ammonia elevated at 88 improved to 29. -Currently waiting for family to talk to palliative care about goals of care.   AKI - presumed ATN 2/2 contrast - not an HD candidate - nephrology signed off. - voiding trial when more stable.    Appendicitis - Appreciate gen surgery, she's high risk for surgery in setting of her cirrhosis - s/p Rocephin  and Flagyl ; plan for total of 10-day course; completed 10/13 - Repeat CT A/P performed 10/16/2023 showing unchanged appendix, 1.1 cm in caliber   Acute upper GI bleed Acute esophageal necrosis Acute blood loss anemia Patient has h/o PUD, gastric ulcer perforation  s/p sx lap 07/2023. Now s/p EGD showing grade D esophagitis, black discoloration mucosa in esophagus concerning for acute esophageal necrosis, portal hypertensive gastropathy, non bleeding gastric ulcer with clean ulcer base with apparent sutures.  No bleeding duodenal ulcers with a clean ulcer base.  No evidence of active bleeding or varices.   Transferred [facility] given findings concerning for acute esophageal necrosis Gastrin level wnl  GI recommending carafate  x14 days, BID PPI until follow up with GI primary at [facility].    Abdominal Pain - improved  Suspect related to abdominal distension from ascites +/- possible appendicitis  Repeat CT scan as noted    Orthostatic Hypotension Currently on Midodrine  and  BP parameters remain borderline low.    Right Lower Extremity Edema Right Lower Extremity Pain LE US  - no evidence of deep vein thrombosis in lower extremity CT with moderate generalized subcutaneous edema throughout both visualized size, similar to recent pelvic CT (anasarca/third spacing vs cellulitis) She's on abx which would cover cellulitis, though lower suspicion for this Pain radiating all down leg, ? Neuropathic pain Continue gabapentin     Hypervolemic Hyponatremia/hypotonic hyponatremia Sodium has improved to 123 today, continue with the Lasix.   Hypomagnesemia  Hypophosphatemia  Hypokalemia Replaced, follow up intermittently.    Tobacco abuse Continue nicotine  patch.   Alcohol abuse Continue thiamine , folate, MVT.   Severe Protein Calorie Malnutrition Body mass index is 28.95 kg/m RD   Patient continued to decline with poor oral intake, increased ammonia levels, worsening hyponatremia, anemic, thrombocytopenic, recommend palliative care consult for goals of care with the rest of the family .     RN Pressure Injury Documentation: Wound 10/15/23 0900 Pressure Injury Sacrum Mid Stage 2 -  Partial thickness loss of dermis presenting as a shallow open injury with a red, pink wound bed without slough. (Active)    Malnutrition Type:  Nutrition Problem: Severe Malnutrition Etiology: chronic illness (cirrhosis)   Malnutrition Characteristics:  Signs/Symptoms: severe fat depletion, severe muscle depletion   Nutrition Interventions:  Interventions: Ensure Enlive (each supplement provides 350kcal and 20 grams of protein), MVI, Liberalize Diet, Magic cup  Estimated body mass index is 23.79 kg/m as calculated from the following:   Height as of this encounter: 5' 2 (1.575 m).   Weight as of this encounter: 59 kg.  Code Status: full code.  DVT Prophylaxis:  SCDs Start: 10/03/23 2153   Level of Care: Level of care: Progressive Family Communication:none at  bedside.   Disposition Plan:     Remains inpatient appropriate:  pending clinical improvement.   Procedures:  Paracentesis   Consultants:   Palliative care Gastroenterology.  Nephrology.  Antimicrobials:   Anti-infectives (From admission, onward)    Start     Dose/Rate Route Frequency Ordered Stop   10/25/23 1415  rifaximin (XIFAXAN) tablet 550 mg        550 mg Oral 2 times daily 10/25/23 1315     10/12/23 1000  metroNIDAZOLE  (FLAGYL ) tablet 500 mg        500 mg Oral Every 12 hours 10/12/23 0734 10/22/23 2125   10/12/23 0830  cefTRIAXone  (ROCEPHIN ) 2 g in sodium chloride  0.9 % 100 mL IVPB        2 g 200 mL/hr over 30 Minutes Intravenous Every 24 hours 10/12/23 0734 10/22/23 1636   10/07/23 1000  fluconazole  (DIFLUCAN ) tablet 100 mg  Status:  Discontinued        100 mg Oral Daily 10/07/23 0853 10/07/23 0855   10/07/23 1000  fluconazole  (DIFLUCAN ) tablet 150 mg        150 mg Oral Daily 10/07/23 0855 10/07/23 1014   10/04/23 1000  cefTRIAXone  (ROCEPHIN ) 2 g in sodium chloride  0.9 % 100 mL IVPB        2 g 200 mL/hr over 30 Minutes Intravenous Every 24 hours 10/03/23 2154 10/08/23 0854   10/03/23 2015  cefTRIAXone  (ROCEPHIN ) 1 g in sodium chloride  0.9 % 100 mL IVPB        1 g 200 mL/hr over 30 Minutes Intravenous  Once 10/03/23 2010 10/03/23 2212        Medications  Scheduled Meds:  calcium  carbonate  1 tablet Oral TID WC   Chlorhexidine  Gluconate Cloth  6 each Topical Daily   feeding supplement  237 mL Oral TID BM   folic acid   1 mg Oral Daily   furosemide  20 mg Oral Daily   gabapentin   300 mg Oral TID   lactulose  30 g Oral TID   lidocaine   2 patch Transdermal Q24H   midodrine   15 mg Oral Q8H   multivitamin with minerals  1 tablet Oral Daily   nicotine   21 mg Transdermal Daily   nutrition supplement (JUVEN)  1 packet Oral BID BM   pantoprazole   40 mg Oral BID   rifaximin  550 mg Oral BID   sucralfate   1 g Oral Q6H   thiamine   100 mg Oral Daily   Continuous  Infusions: PRN Meds:.albuterol , alum & mag hydroxide-simeth, artificial tears, diphenhydrAMINE , Muscle Rub, ondansetron  (ZOFRAN ) IV, mouth rinse, oxyCODONE , phenol, prochlorperazine, simethicone , witch hazel-glycerin    Subjective:   Joan Wood was seen and examined today.  No new complaints today.    Objective:   Vitals:   10/27/23 0500 10/27/23 0830 10/27/23 1124 10/27/23 1537  BP:  104/62 107/70 (!) 83/52  Pulse:  94 95 98  Resp:  20 20 16   Temp:  98 F (36.7 C) 97.9 F (36.6 C) 97.7 F (36.5 C)  TempSrc:  Oral Oral Axillary  SpO2:  100% 100%   Weight: 59 kg     Height:        Intake/Output Summary (Last 24 hours) at 10/27/2023 1748 Last data filed at 10/27/2023 0500 Gross per 24 hour  Intake 220 ml  Output 600 ml  Net -380 ml   Filed Weights   10/24/23 0500 10/25/23 0500 10/27/23 0500  Weight: 55.4 kg 59.3 kg 59 kg     Exam General exam: Appears calm and comfortable  Respiratory system: Clear to auscultation. Respiratory effort normal. Cardiovascular system: S1 & S2 heard, RRR. No JVD,  Gastrointestinal system: Abdomen is nondistended, soft and nontender.  Central nervous system: Alert and oriented to person, severe memory deficits.  Extremities: no edema.  Skin: No rashes, Psychiatry:  Mood appropriate.     Data Reviewed:  I have personally reviewed following labs and imaging studies   CBC Lab Results  Component Value Date   WBC 11.0 (H) 10/27/2023   RBC 2.39 (L) 10/27/2023   HGB 8.0 (L) 10/27/2023   HCT 22.6 (L) 10/27/2023   MCV 94.6 10/27/2023   MCH 33.5 10/27/2023   PLT 168 10/27/2023   MCHC 35.4 10/27/2023   RDW 21.8 (H) 10/27/2023   LYMPHSABS 1.2 10/27/2023   MONOABS 1.5 (H) 10/27/2023   EOSABS 0.1 10/27/2023   BASOSABS 0.0 10/27/2023     Last metabolic panel Lab Results  Component Value Date   NA 123 (L) 10/27/2023  K 4.6 10/27/2023   CL 91 (L) 10/27/2023   CO2 21 (L) 10/27/2023   BUN 18 10/27/2023   CREATININE 0.56  10/27/2023   GLUCOSE 101 (H) 10/27/2023   GFRNONAA >60 10/27/2023   CALCIUM  8.9 10/27/2023   PHOS 2.6 10/17/2023   PROT 6.2 (L) 10/27/2023   ALBUMIN  2.8 (L) 10/27/2023   LABGLOB 4.4 07/30/2023   AGRATIO 1.2 03/28/2022   BILITOT 5.7 (H) 10/27/2023   ALKPHOS 59 10/27/2023   AST 87 (H) 10/27/2023   ALT 16 10/27/2023   ANIONGAP 11 10/27/2023    CBG (last 3)  Recent Labs    10/27/23 0830 10/27/23 1124 10/27/23 1535  GLUCAP 128* 112* 124*      Coagulation Profile: Recent Labs  Lab 10/23/23 0140 10/24/23 0308 10/25/23 0215 10/26/23 0515 10/27/23 0603  INR 2.6* 2.4* 2.5* 2.3* 2.4*     Radiology Studies: IR ABDOMEN US  LIMITED Result Date: 10/27/2023 CLINICAL DATA:  Ascites evaluation EXAM: LIMITED ABDOMEN ULTRASOUND FOR ASCITES TECHNIQUE: Limited ultrasound survey for ascites was performed in all four abdominal quadrants. COMPARISON:  Recent prior paracentesis 10/22/2023 FINDINGS: Sonographic evaluation of the abdomen demonstrates only small volume ascites. Multiple loops of small bowel are visualized mildly distended and fluid-filled. IMPRESSION: Only trace ascites, insufficient for paracentesis. Multiple loops of fluid-filled small bowel are visualized. This may account for any symptoms of abdominal distension and may represent portal enteropathy, or potentially developing ileus/obstruction. Electronically Signed   By: Wilkie Lent M.D.   On: 10/27/2023 08:34        Elgie Butter M.D. [facility] 10/27/2023, 5:48 PM  Available via Epic secure chat 7am-7pm After 7 pm, please refer to night coverage provider listed on amion.

## 2023-10-27 NOTE — Plan of Care (Signed)
  Problem: Coping: Goal: Level of anxiety will decrease Outcome: Progressing   Problem: Elimination: Goal: Will not experience complications related to bowel motility Outcome: Progressing   Problem: Pain Managment: Goal: General experience of comfort will improve and/or be controlled Outcome: Progressing

## 2023-10-27 NOTE — Plan of Care (Signed)
  Problem: Health Behavior/Discharge Planning: Goal: Ability to manage health-related needs will improve Outcome: Progressing   Problem: Clinical Measurements: Goal: Will remain free from infection Outcome: Progressing   Problem: Clinical Measurements: Goal: Respiratory complications will improve Outcome: Progressing   Problem: Activity: Goal: Risk for activity intolerance will decrease Outcome: Progressing   Problem: Nutrition: Goal: Adequate nutrition will be maintained Outcome: Progressing   Problem: Coping: Goal: Level of anxiety will decrease Outcome: Progressing   Problem: Elimination: Goal: Will not experience complications related to bowel motility Outcome: Progressing   Problem: Elimination: Goal: Will not experience complications related to urinary retention Outcome: Progressing   Problem: Pain Managment: Goal: General experience of comfort will improve and/or be controlled Outcome: Progressing   Problem: Safety: Goal: Ability to remain free from injury will improve Outcome: Progressing   Problem: Skin Integrity: Goal: Risk for impaired skin integrity will decrease Outcome: Progressing

## 2023-10-27 NOTE — Progress Notes (Signed)
 Occupational Therapy Treatment Patient Details Name: Joan Wood MRN: [id number] DOB: 10/04/1970 Today's Date: 10/27/2023   History of present illness 53 y.o. female presents to [facility] hospital on 10/03/2023 with nausea/vomiting and bloody diarrhea. Pt underwent upper EGD on 9/25, concerning for possible acute esophageal necrosis. Pt also underwent paracentesis on 9/25 and 10/6. PMH includes decompensated cirrhosis, PUD, GERD, pancreatitis, OSA.   OT comments  Patient received in supine and agreeable to OT treatment and getting to EOB. Once attempting bed mobility patient was found to be in soiled bed due to flexiseal leak.  Patient performed rolling in bed to allow for cleaning, gown change, and linen change. Patient declined EOB after cleaning due to complaints of bottom pain from flexiseal and cleaning. Level one therapy band provided to allow for elbow flexion and extension exercises and patient performed AROM for BUE shoulders.  Patient will benefit from continued inpatient follow up therapy, <3 hours/day.  Acute OT to continue to follow to address established goals to facilitate DC to next venue of care.        If plan is discharge home, recommend the following:  Direct supervision/assist for medications management;Direct supervision/assist for financial management;Two people to help with walking and/or transfers;Two people to help with bathing/dressing/bathroom;Supervision due to cognitive status   Equipment Recommendations  BSC/3in1    Recommendations for Other Services      Precautions / Restrictions Precautions Precautions: Fall Recall of Precautions/Restrictions: Impaired Precaution/Restrictions Comments: orthostatic, foley, flexiseal Restrictions Weight Bearing Restrictions Per Provider Order: No       Mobility Bed Mobility Overal bed mobility: Needs Assistance Bed Mobility: Rolling Rolling: Contact guard assist, Min assist         General bed mobility comments: CGA to  roll to right and min assist to left    Transfers                   General transfer comment: patient declined OOB following cleaning     Balance                                           ADL either performed or assessed with clinical judgement   ADL Overall ADL's : Needs assistance/impaired         Upper Body Bathing: Moderate assistance;Bed level   Lower Body Bathing: Bed level;Total assistance   Upper Body Dressing : Minimal assistance;Bed level Upper Body Dressing Details (indicate cue type and reason): new gown                   General ADL Comments: patient was found soiled due to leaky flexiseal and was assisted with bathing and gown change    Extremity/Trunk Assessment              Vision       Perception     Praxis     Communication Communication Communication: No apparent difficulties   Cognition Arousal: Alert Behavior During Therapy: Lability Cognition: Cognition impaired     Awareness: Intellectual awareness impaired Memory impairment (select all impairments): Short-term memory, Working memory Attention impairment (select first level of impairment): Sustained attention Executive functioning impairment (select all impairments): Organization, Reasoning, Problem solving                   Following commands: Impaired Following commands impaired: Follows multi-step commands inconsistently, Follows one step commands  with increased time      Cueing   Cueing Techniques: Verbal cues, Visual cues, Tactile cues  Exercises Exercises: General Upper Extremity General Exercises - Upper Extremity Shoulder Flexion: AROM, Both, 10 reps, Supine Elbow Flexion: Strengthening, Both, 10 reps, Theraband Theraband Level (Elbow Flexion): Level 1 (Yellow)    Shoulder Instructions       General Comments      Pertinent Vitals/ Pain       Pain Assessment Pain Assessment: Faces Faces Pain Scale: Hurts whole lot Pain  Location: anus (from flexiseal) Pain Descriptors / Indicators: Discomfort, Sore, Grimacing Pain Intervention(s): Limited activity within patient's tolerance, Monitored during session, Repositioned  Home Living                                          Prior Functioning/Environment              Frequency  Min 2X/week        Progress Toward Goals  OT Goals(current goals can now be found in the care plan section)  Progress towards OT goals: Progressing toward goals  Acute Rehab OT Goals Patient Stated Goal: to get better OT Goal Formulation: With patient Time For Goal Achievement: 11/05/23 Potential to Achieve Goals: Fair ADL Goals Pt Will Perform Grooming: with set-up;sitting Pt Will Perform Lower Body Dressing: with supervision;sitting/lateral leans;sit to/from stand;with adaptive equipment Pt Will Transfer to Toilet: with min assist;bedside commode Pt Will Perform Toileting - Clothing Manipulation and hygiene: with supervision;sit to/from stand;sitting/lateral leans Pt/caregiver will Perform Home Exercise Program: Both right and left upper extremity;With written HEP provided;With theraband  Plan      Co-evaluation                 AM-PAC OT 6 Clicks Daily Activity     Outcome Measure   Help from another person eating meals?: A Little Help from another person taking care of personal grooming?: A Little Help from another person toileting, which includes using toliet, bedpan, or urinal?: A Lot Help from another person bathing (including washing, rinsing, drying)?: A Lot Help from another person to put on and taking off regular upper body clothing?: A Lot Help from another person to put on and taking off regular lower body clothing?: Total 6 Click Score: 13    End of Session    OT Visit Diagnosis: Other abnormalities of gait and mobility (R26.89);Muscle weakness (generalized) (M62.81);Pain;Other symptoms and signs involving cognitive  function Pain - part of body:  (buttocks)   Activity Tolerance Patient tolerated treatment well   Patient Left in bed;with call bell/phone within reach;with bed alarm set   Nurse Communication Mobility status;Other (comment) (leaky flexiseal)        Time: 1132-1208 OT Time Calculation (min): 36 min  Charges: OT General Charges $OT Visit: 1 Visit OT Treatments $Self Care/Home Management : 8-22 mins $Therapeutic Exercise: 8-22 mins  Dick Laine, OTA Acute Rehabilitation Services  Office [phone]   Jeb LITTIE Laine 10/27/2023, 2:15 PM

## 2023-10-28 DIAGNOSIS — Z515 Encounter for palliative care: Secondary | ICD-10-CM | POA: Diagnosis not present

## 2023-10-28 DIAGNOSIS — K7290 Hepatic failure, unspecified without coma: Secondary | ICD-10-CM | POA: Diagnosis not present

## 2023-10-28 DIAGNOSIS — R112 Nausea with vomiting, unspecified: Secondary | ICD-10-CM | POA: Diagnosis not present

## 2023-10-28 DIAGNOSIS — D62 Acute posthemorrhagic anemia: Secondary | ICD-10-CM | POA: Diagnosis not present

## 2023-10-28 LAB — CBC WITH DIFFERENTIAL/PLATELET
Abs Immature Granulocytes: 0.08 K/uL — ABNORMAL HIGH (ref 0.00–0.07)
Basophils Absolute: 0 K/uL (ref 0.0–0.1)
Basophils Relative: 0 %
Eosinophils Absolute: 0.1 K/uL (ref 0.0–0.5)
Eosinophils Relative: 1 %
HCT: 21.3 % — ABNORMAL LOW (ref 36.0–46.0)
Hemoglobin: 7.4 g/dL — ABNORMAL LOW (ref 12.0–15.0)
Immature Granulocytes: 1 %
Lymphocytes Relative: 10 %
Lymphs Abs: 1.1 K/uL (ref 0.7–4.0)
MCH: 33.8 pg (ref 26.0–34.0)
MCHC: 34.7 g/dL (ref 30.0–36.0)
MCV: 97.3 fL (ref 80.0–100.0)
Monocytes Absolute: 1.1 K/uL — ABNORMAL HIGH (ref 0.1–1.0)
Monocytes Relative: 10 %
Neutro Abs: 8.5 K/uL — ABNORMAL HIGH (ref 1.7–7.7)
Neutrophils Relative %: 78 %
Platelets: 144 K/uL — ABNORMAL LOW (ref 150–400)
RBC: 2.19 MIL/uL — ABNORMAL LOW (ref 3.87–5.11)
RDW: 21.4 % — ABNORMAL HIGH (ref 11.5–15.5)
WBC: 10.8 K/uL — ABNORMAL HIGH (ref 4.0–10.5)
nRBC: 0 % (ref 0.0–0.2)

## 2023-10-28 LAB — COMPREHENSIVE METABOLIC PANEL WITH GFR
ALT: 16 U/L (ref 0–44)
AST: 77 U/L — ABNORMAL HIGH (ref 15–41)
Albumin: 2.6 g/dL — ABNORMAL LOW (ref 3.5–5.0)
Alkaline Phosphatase: 65 U/L (ref 38–126)
Anion gap: 11 (ref 5–15)
BUN: 19 mg/dL (ref 6–20)
CO2: 18 mmol/L — ABNORMAL LOW (ref 22–32)
Calcium: 8.3 mg/dL — ABNORMAL LOW (ref 8.9–10.3)
Chloride: 92 mmol/L — ABNORMAL LOW (ref 98–111)
Creatinine, Ser: 0.49 mg/dL (ref 0.44–1.00)
GFR, Estimated: >60 mL/min (ref >60)
Glucose, Bld: 108 mg/dL — ABNORMAL HIGH (ref 70–99)
Potassium: 3.5 mmol/L (ref 3.5–5.1)
Sodium: 121 mmol/L — ABNORMAL LOW (ref 135–145)
Total Bilirubin: 4.2 mg/dL — ABNORMAL HIGH (ref 0.0–1.2)
Total Protein: 5.8 g/dL — ABNORMAL LOW (ref 6.5–8.1)

## 2023-10-28 LAB — GLUCOSE, CAPILLARY
Glucose-Capillary: 100 mg/dL — ABNORMAL HIGH (ref 70–99)
Glucose-Capillary: 94 mg/dL (ref 70–99)
Glucose-Capillary: 105 mg/dL — ABNORMAL HIGH (ref 70–99)
Glucose-Capillary: 117 mg/dL — ABNORMAL HIGH (ref 70–99)
Glucose-Capillary: 94 mg/dL (ref 70–99)

## 2023-10-28 LAB — PROTIME-INR
INR: 2.4 — ABNORMAL HIGH (ref 0.8–1.2)
Prothrombin Time: 27.4 s — ABNORMAL HIGH (ref 11.4–15.2)

## 2023-10-28 LAB — MAGNESIUM: Magnesium: 1.7 mg/dL (ref 1.7–2.4)

## 2023-10-28 NOTE — Plan of Care (Signed)
  Problem: Health Behavior/Discharge Planning: Goal: Ability to manage health-related needs will improve Outcome: Progressing   Problem: Clinical Measurements: Goal: Will remain free from infection Outcome: Progressing   Problem: Activity: Goal: Risk for activity intolerance will decrease Outcome: Progressing   Problem: Coping: Goal: Level of anxiety will decrease Outcome: Progressing   Problem: Elimination: Goal: Will not experience complications related to urinary retention Outcome: Progressing   Problem: Elimination: Goal: Will not experience complications related to bowel motility Outcome: Progressing   Problem: Pain Managment: Goal: General experience of comfort will improve and/or be controlled Outcome: Progressing   Problem: Safety: Goal: Ability to remain free from injury will improve Outcome: Progressing   Problem: Skin Integrity: Goal: Risk for impaired skin integrity will decrease Outcome: Progressing

## 2023-10-28 NOTE — Progress Notes (Signed)
 [facility]                                                                               Joan Wood, is a 53 y.o. female, DOB - 14-Mar-1970, FMW:[id number] Admit date - 10/03/2023    Outpatient Primary MD for the patient is Joan Wood  LOS - 25  days    Brief summary  Joan Wood is a 53 year old female with decompensated cirrhosis, PUD with recent gastric ulcer perforation s/p ex-lap, GERD, pancreatitis, OSA who presented to [facility] with N/V/bloody diarrhea x 4 days.    She had coffee ground emesis in the ED. CTA GI neg for acute bleed, large ascites and new distal esophageal thickening.    GI consulted for acute blood loss anemia with Hg 8.3>7. She was given PPI, started on octreotide  gtt, rocephin , vitamin K , pain and nausea control and admitted to hospitalist.    Now s/p upper endoscopy showing black discoloration of esophageal mucosa concerning for acute esophageal necrosis, portal hypertension, non-bleeding gastric and duodenal ulcers.    Transferred to [facility] ICU for further management.    Significant Events 9/24: ED for upper GI bleed admit to hospitalist  9/25: 1 U PRBC, GI upper endoscopy w/ acute esophageal necrosis with recommendation for transfer to [facility] > NPO, s/p paracentesis 3.35L drained Transferred to hospitalists service 9/27 GI signed off 9/30 10/1 US  without sufficient ascites to tap 10/3 CT with findings concerning for appendicitis, surgery recommending antibiotics (high risk for surgery).  GI reconsulted to to progressive liver failure.  10/4 diagnostic para without evidence SBP 10/5 diuretics held with doubling creatinine 10/6 para with 4 L off.  Renal consult for progressive AKI. 10/15 pt pleasantly confused, , has rectal tube and foley catheter.  10/17 palliative care on board, and goc discussions are on going .  No new changes today patient appears comfortable    Assessment & Plan    Assessment and Plan:   Decompensated  Alcoholic Cirrhosis Hepatic Encephalopathy - improved Ascites  Bilateral Pleural Effusions  Hypoalbuminemia  Thrombocytopenia  - S/p 5 days ceftriaxone  (now on abx with appendicitis below) -Completed trial of NAC as well - s/p paracentesis as needed; last on 10/6, 10/13 . - still remains fluid overloaded, abdomen is distended, one dose of lasix ordered.  - poor oral intake.  - palliative care consulted for GOC discussions as prognosis is poor and MELD Na Score remains high.  - patient continues to decline, more confused despite 30 g of lactulose TID, adding rifaximin.  - Ammonia elevated at 88 improved to 29. -Currently waiting for family to talk to palliative care about goals of care.   AKI - presumed ATN 2/2 contrast - not an HD candidate - nephrology signed off. - voiding trial when more stable.    Appendicitis - Appreciate gen surgery, she's high risk for surgery in setting of her cirrhosis - s/p Rocephin  and Flagyl ; plan for total of 10-day course; completed 10/13 - Repeat CT A/P performed 10/16/2023 showing unchanged appendix, 1.1 cm in caliber   Acute upper GI bleed Acute esophageal necrosis Acute blood loss anemia Patient has h/o PUD, gastric ulcer perforation  s/p sx lap 07/2023. Now s/p EGD showing grade D esophagitis, black discoloration mucosa in esophagus concerning for acute esophageal necrosis, portal hypertensive gastropathy, non bleeding gastric ulcer with clean ulcer base with apparent sutures.  No bleeding duodenal ulcers with a clean ulcer base.  No evidence of active bleeding or varices.   Transferred to [facility] given findings concerning for acute esophageal necrosis Gastrin level wnl  GI recommending carafate  x14 days, BID PPI until follow up with GI primary at [facility].    Abdominal Pain - improved  Suspect related to abdominal distension from ascites +/- possible appendicitis  Repeat CT scan as noted    Orthostatic Hypotension Currently on Midodrine  and  BP parameters remain borderline low.    Right Lower Extremity Edema Right Lower Extremity Pain LE US  - no evidence of deep vein thrombosis in lower extremity CT with moderate generalized subcutaneous edema throughout both visualized size, similar to recent pelvic CT (anasarca/third spacing vs cellulitis) She's on abx which would cover cellulitis, though lower suspicion for this Pain radiating all down leg, ? Neuropathic pain Continue gabapentin     Hypervolemic Hyponatremia/hypotonic hyponatremia Sodium still at 121.   Hypomagnesemia  Hypophosphatemia  Hypokalemia Replaced, follow up intermittently.    Tobacco abuse Continue nicotine  patch.   Alcohol abuse Continue thiamine , folate, MVT.   Severe Protein Calorie Malnutrition Body mass index is 28.95 kg/m RD   Patient continued to decline with poor oral intake, increased ammonia levels, worsening hyponatremia, anemic, thrombocytopenic, recommend palliative care consult for goals of care with the rest of the family .     RN Pressure Injury Documentation: Wound 10/15/23 0900 Pressure Injury Sacrum Mid Stage 2 -  Partial thickness loss of dermis presenting as a shallow open injury with a red, pink wound bed without slough. (Active)    Malnutrition Type:  Nutrition Problem: Severe Malnutrition Etiology: chronic illness (cirrhosis)   Malnutrition Characteristics:  Signs/Symptoms: severe fat depletion, severe muscle depletion   Nutrition Interventions:  Interventions: Ensure Enlive (each supplement provides 350kcal and 20 grams of protein), MVI, Liberalize Diet, Magic cup  Estimated body mass index is 23.79 kg/m as calculated from the following:   Height as of this encounter: 5' 2 (1.575 m).   Weight as of this encounter: 59 kg.  Code Status: full code.  DVT Prophylaxis:  SCDs Start: 10/03/23 2153   Level of Care: Level of care: Progressive Family Communication:none at bedside.   Disposition Plan:     Remains  inpatient appropriate:  pending clinical improvement.   Procedures:  Paracentesis   Consultants:   Palliative care Gastroenterology.  Nephrology.  Antimicrobials:   Anti-infectives (From admission, onward)    Start     Dose/Rate Route Frequency Ordered Stop   10/25/23 1415  rifaximin (XIFAXAN) tablet 550 mg        550 mg Oral 2 times daily 10/25/23 1315     10/12/23 1000  metroNIDAZOLE  (FLAGYL ) tablet 500 mg        500 mg Oral Every 12 hours 10/12/23 0734 10/22/23 2125   10/12/23 0830  cefTRIAXone  (ROCEPHIN ) 2 g in sodium chloride  0.9 % 100 mL IVPB        2 g 200 mL/hr over 30 Minutes Intravenous Every 24 hours 10/12/23 0734 10/22/23 1636   10/07/23 1000  fluconazole  (DIFLUCAN ) tablet 100 mg  Status:  Discontinued        100 mg Oral Daily 10/07/23 0853 10/07/23 0855   10/07/23 1000  fluconazole  (DIFLUCAN ) tablet 150 mg  150 mg Oral Daily 10/07/23 0855 10/07/23 1014   10/04/23 1000  cefTRIAXone  (ROCEPHIN ) 2 g in sodium chloride  0.9 % 100 mL IVPB        2 g 200 mL/hr over 30 Minutes Intravenous Every 24 hours 10/03/23 2154 10/08/23 0854   10/03/23 2015  cefTRIAXone  (ROCEPHIN ) 1 g in sodium chloride  0.9 % 100 mL IVPB        1 g 200 mL/hr over 30 Minutes Intravenous  Once 10/03/23 2010 10/03/23 2212        Medications  Scheduled Meds:  calcium  carbonate  1 tablet Oral TID WC   Chlorhexidine  Gluconate Cloth  6 each Topical Daily   feeding supplement  237 mL Oral TID BM   folic acid   1 mg Oral Daily   furosemide  20 mg Oral Daily   gabapentin   300 mg Oral TID   lactulose  30 g Oral TID   lidocaine   2 patch Transdermal Q24H   midodrine   15 mg Oral Q8H   multivitamin with minerals  1 tablet Oral Daily   nicotine   21 mg Transdermal Daily   nutrition supplement (JUVEN)  1 packet Oral BID BM   pantoprazole   40 mg Oral BID   rifaximin  550 mg Oral BID   sucralfate   1 g Oral Q6H   thiamine   100 mg Oral Daily   Continuous Infusions: PRN Meds:.albuterol , alum & mag  hydroxide-simeth, artificial tears, diphenhydrAMINE , Muscle Rub, ondansetron  (ZOFRAN ) IV, mouth rinse, oxyCODONE , phenol, prochlorperazine, simethicone , witch hazel-glycerin    Subjective:   Joan Wood was seen and examined today.  Cheerful, no new complaints.    Objective:   Vitals:   10/28/23 0500 10/28/23 0751 10/28/23 1148 10/28/23 1546  BP:  (!) 93/54 93/60 (!) 98/56  Pulse:  93 96 98  Resp:  16 16 17   Temp:  99.4 F (37.4 C) 98.9 F (37.2 C) 99 F (37.2 C)  TempSrc:  Oral Oral Oral  SpO2:  100% 100% 99%  Weight: 59 kg     Height:        Intake/Output Summary (Last 24 hours) at 10/28/2023 1836 Last data filed at 10/28/2023 1800 Gross per 24 hour  Intake 180 ml  Output 450 ml  Net -270 ml   Filed Weights   10/25/23 0500 10/27/23 0500 10/28/23 0500  Weight: 59.3 kg 59 kg 59 kg     Exam General exam: Appears calm and comfortable  Respiratory system: Clear to auscultation. Respiratory effort normal. Cardiovascular system: S1 & S2 heard, RRR.  Gastrointestinal system: Abdomen is nondistended, soft and nontender. Central nervous system: Alert and oriented. No focal neurological deficits. Extremities: Symmetric 5 x 5 power. Skin: Jaundiced.  Psychiatry:  Mood & affect appropriate.      Data Reviewed:  I have personally reviewed following labs and imaging studies   CBC Lab Results  Component Value Date   WBC 10.8 (H) 10/28/2023   RBC 2.19 (L) 10/28/2023   HGB 7.4 (L) 10/28/2023   HCT 21.3 (L) 10/28/2023   MCV 97.3 10/28/2023   MCH 33.8 10/28/2023   PLT 144 (L) 10/28/2023   MCHC 34.7 10/28/2023   RDW 21.4 (H) 10/28/2023   LYMPHSABS 1.1 10/28/2023   MONOABS 1.1 (H) 10/28/2023   EOSABS 0.1 10/28/2023   BASOSABS 0.0 10/28/2023     Last metabolic panel Lab Results  Component Value Date   NA 121 (L) 10/28/2023   K 3.5 10/28/2023   CL 92 (L) 10/28/2023  CO2 18 (L) 10/28/2023   BUN 19 10/28/2023   CREATININE 0.49 10/28/2023   GLUCOSE 108 (H)  10/28/2023   GFRNONAA >60 10/28/2023   CALCIUM  8.3 (L) 10/28/2023   PHOS 2.6 10/17/2023   PROT 5.8 (L) 10/28/2023   ALBUMIN  2.6 (L) 10/28/2023   LABGLOB 4.4 07/30/2023   AGRATIO 1.2 03/28/2022   BILITOT 4.2 (H) 10/28/2023   ALKPHOS 65 10/28/2023   AST 77 (H) 10/28/2023   ALT 16 10/28/2023   ANIONGAP 11 10/28/2023    CBG (last 3)  Recent Labs    10/28/23 0748 10/28/23 1149 10/28/23 1544  GLUCAP 94 105* 117*      Coagulation Profile: Recent Labs  Lab 10/24/23 0308 10/25/23 0215 10/26/23 0515 10/27/23 0603 10/28/23 0645  INR 2.4* 2.5* 2.3* 2.4* 2.4*     Radiology Studies: No results found.       Elgie Butter M.D. [facility] 10/28/2023, 6:36 PM  Available via Epic secure chat 7am-7pm After 7 pm, please refer to night coverage provider listed on amion.

## 2023-10-28 NOTE — Progress Notes (Signed)
 Palliative Medicine Inpatient Follow Up Note   HPI: 53 y.o. female  with past medical history of  s/p exploratory laparotomy for perforated gastric ulcer repair with omental patch on 7/27, ongoing alcohol use disorder, tobacco abuse, cirrhosis, HTN, GERD, anxiety/depression, multiple hospitalizations,  admitted on 10/03/2023 at [facility] with complaints of nausea, vomiting and coffee-ground emesis, symptoms ongoing for last 2 days, she reports last alcoholic beverage she had Monday or Sunday, for nausea, vomiting and diarrhea has been ongoing for last 2 days, and it turned today into coffee-ground emesis. Later (10/04/2023) transferred to [facility] ICU for further management. Now status post upper endoscopy, showing black discoloration of esophageal mucosa concerning for acute esophageal necrosis, portal hypertension, non-bleeding gastric and duodenal ulcers.    Paracentesis on 10/15/2023: 4.0 liters of ascitic fluid removed. Paracentesis on 10/22/2023: 3.3 liters of ascitic fluid removed.   Worth to note that this is her 7 inpatient admissions, and has 4 ED encounter. Her most recent hospital stay was in August 2025 due to GI bleed and abdominal pain.    PMT has been consulted to assist with goals of care conversation. Patient/Family face treatment option decisions, advanced directive decisions and anticipatory care needs.    Family face treatment option decision, advance directive decisions and anticipatory care needs.    Today's Discussion 10/28/2023  *Please note that this is a verbal dictation therefore any spelling or grammatical errors are due to the Dragon Medical One system interpretation.  Chart reviewed inclusive of vital signs, progress notes, laboratory results, and diagnostic images.  Ammonia level increasing from 62 last 10/15/23 to 78 10/20/2023 then 88 on 10/24/2023.   Visited the patient at bedside today, no family members or friends were present during the encounter. The patient was  initially asleep but awakened easily and greeted me with a warm smile. She reported having had a restful night and shared that the previous day had also been a good one. She expressed happiness at seeing me and stated she was in good spirits and ready to start her day. The patient was alert and oriented, without any signs of distress or discomfort. She denied experiencing any acute pain. She noted that over the past few days, she has felt an overall improvement and is choosing to embrace each day positively, acknowledging that her current well-being may be temporary.  She shared that her daughter and other family members, including her grandchildren, are expected to visit today, and she is looking forward to spending time with them. She was preparing to eat breakfast and requested that I notify the nurse to administer her medications prior to her meal.  Goals of care remains unchanged. Continue to treat the treatable in hopes for clinical improvement.   Patient face treatment option decisions, advanced directive decisions and anticipatory care needs.   Questions and concerns addressed   Palliative Support Provided.   Objective Assessment:  Physical Exam Constitutional:      Appearance: chronically-ill, fatigued-looking, flexiseal in place. HENT:     Head: Normocephalic and atraumatic. :  Cardiovascular:     Normal rate Pulmonary:     Effort: Pulmonary effort is normal. No respiratory distress.     Breath sounds: Normal breath sounds. No wheezing.  Abdominal:     General: Bowel sounds are normal. There is distension.     Palpations: Abdomen is soft.     Tenderness: positive for abdominal ternderness Musculoskeletal:        General: Normal range of motion.  Skin:  General: Skin is warm and dry.  Neurological:     General: No focal deficit present.     Mental Status: She is alert.    Vital Signs Vitals:   10/28/23 0751 10/28/23 1148  BP: (!) 93/54 93/60  Pulse: 93 96  Resp:  16 16  Temp: 99.4 F (37.4 C) 98.9 F (37.2 C)  SpO2: 100% 100%    Intake/Output Summary (Last 24 hours) at 10/28/2023 1314 Last data filed at 10/27/2023 2200 Gross per 24 hour  Intake 180 ml  Output 250 ml  Net -70 ml   Last Weight  Most recent update: 10/28/2023  6:36 AM    Weight  59 kg (130 lb 1.1 oz)                SUMMARY OF RECOMMENDATIONS   Code Status: Maintain Full Code Continue current scope of care Goal of care is medical stabilization and recovery to the extent this is possible The patient was encouraged to initiate open communication with her children regarding her current medical condition, including both her short- and long-term GOC. Spiritual care consult to assist with AD creation. Continue to provide psycho-social and emotional support to patient and family Palliative medicine team will continue to follow.   Symptom Management: Per Primary team Palliative medicine is available to assist as needed.      Time Spent: 35 minutes  Detailed review of medical records (labs, imaging, vital signs), medically appropriate exam, discussed with treatment team, counseling and education to patient, family, & staff, documenting clinical information, coordination of care.   ______________________________________________________________________________________ Kathlyne Bolder NP-C [facility] Team Team Cell Phone: [phone] Please utilize secure chat with additional questions, if there is no response within 30 minutes please call the above phone number  Palliative Medicine Team providers are available by phone from 7am to 7pm daily and can be reached through the team cell phone.  Should this patient require assistance outside of these hours, please call the patient's attending physician.

## 2023-10-28 NOTE — Plan of Care (Signed)
  Problem: Clinical Measurements: Goal: Ability to maintain clinical measurements within normal limits will improve Outcome: Progressing   Problem: Nutrition: Goal: Adequate nutrition will be maintained Outcome: Progressing   Problem: Pain Managment: Goal: General experience of comfort will improve and/or be controlled Outcome: Progressing   Problem: Safety: Goal: Ability to remain free from injury will improve Outcome: Progressing   Problem: Activity: Goal: Risk for activity intolerance will decrease Outcome: Not Progressing   Problem: Elimination: Goal: Will not experience complications related to bowel motility Outcome: Not Progressing Goal: Will not experience complications related to urinary retention Outcome: Not Progressing

## 2023-10-29 ENCOUNTER — Telehealth (HOSPITAL_COMMUNITY): Payer: Self-pay | Admitting: Pharmacy Technician

## 2023-10-29 DIAGNOSIS — D62 Acute posthemorrhagic anemia: Secondary | ICD-10-CM | POA: Diagnosis not present

## 2023-10-29 DIAGNOSIS — R112 Nausea with vomiting, unspecified: Secondary | ICD-10-CM | POA: Diagnosis not present

## 2023-10-29 DIAGNOSIS — Z515 Encounter for palliative care: Secondary | ICD-10-CM | POA: Diagnosis not present

## 2023-10-29 DIAGNOSIS — K7290 Hepatic failure, unspecified without coma: Secondary | ICD-10-CM | POA: Diagnosis not present

## 2023-10-29 LAB — BASIC METABOLIC PANEL WITH GFR
Anion gap: 10 (ref 5–15)
BUN: 18 mg/dL (ref 6–20)
CO2: 21 mmol/L — ABNORMAL LOW (ref 22–32)
Calcium: 8.8 mg/dL — ABNORMAL LOW (ref 8.9–10.3)
Chloride: 93 mmol/L — ABNORMAL LOW (ref 98–111)
Creatinine, Ser: 0.54 mg/dL (ref 0.44–1.00)
GFR, Estimated: >60 mL/min (ref >60)
Glucose, Bld: 117 mg/dL — ABNORMAL HIGH (ref 70–99)
Potassium: 3.5 mmol/L (ref 3.5–5.1)
Sodium: 124 mmol/L — ABNORMAL LOW (ref 135–145)

## 2023-10-29 LAB — GLUCOSE, CAPILLARY
Glucose-Capillary: 105 mg/dL — ABNORMAL HIGH (ref 70–99)
Glucose-Capillary: 111 mg/dL — ABNORMAL HIGH (ref 70–99)
Glucose-Capillary: 111 mg/dL — ABNORMAL HIGH (ref 70–99)
Glucose-Capillary: 117 mg/dL — ABNORMAL HIGH (ref 70–99)
Glucose-Capillary: 121 mg/dL — ABNORMAL HIGH (ref 70–99)
Glucose-Capillary: 126 mg/dL — ABNORMAL HIGH (ref 70–99)

## 2023-10-29 LAB — PROTIME-INR
INR: 2.3 — ABNORMAL HIGH (ref 0.8–1.2)
Prothrombin Time: 26.2 s — ABNORMAL HIGH (ref 11.4–15.2)

## 2023-10-29 MED ORDER — GABAPENTIN 300 MG PO CAPS: 300.0000 mg | ORAL_CAPSULE | Freq: Three times a day (TID) | ORAL | 1 refills | Status: AC

## 2023-10-29 MED ORDER — LACTULOSE 10 GM/15ML PO SOLN: 30.0000 g | Freq: Three times a day (TID) | ORAL | 0 refills | Status: AC

## 2023-10-29 MED ORDER — MIDODRINE HCL 5 MG PO TABS: 15.0000 mg | ORAL_TABLET | Freq: Three times a day (TID) | ORAL | 1 refills | Status: AC

## 2023-10-29 MED ORDER — FOLIC ACID 1 MG PO TABS: 1.0000 mg | ORAL_TABLET | Freq: Every day | ORAL | 1 refills | Status: AC

## 2023-10-29 MED ORDER — ENSURE PLUS HIGH PROTEIN PO LIQD: 237.0000 mL | Freq: Three times a day (TID) | ORAL | 1 refills | Status: AC

## 2023-10-29 MED ORDER — VITAMIN B-1 100 MG PO TABS: 100.0000 mg | ORAL_TABLET | Freq: Every day | ORAL | Status: AC

## 2023-10-29 MED ORDER — RIFAXIMIN 550 MG PO TABS: 550.0000 mg | ORAL_TABLET | Freq: Two times a day (BID) | ORAL | 0 refills | Status: AC

## 2023-10-29 MED ORDER — SUCRALFATE 1 GM/10ML PO SUSP: 1.0000 g | Freq: Four times a day (QID) | ORAL | 0 refills | Status: AC

## 2023-10-29 MED ORDER — PANTOPRAZOLE SODIUM 40 MG PO TBEC: 40.0000 mg | DELAYED_RELEASE_TABLET | Freq: Two times a day (BID) | ORAL | 1 refills | Status: AC

## 2023-10-29 MED ORDER — LIDOCAINE 5 % EX PTCH: 2.0000 | MEDICATED_PATCH | CUTANEOUS | 0 refills | Status: AC

## 2023-10-29 MED ORDER — CALCIUM CARBONATE ANTACID 500 MG PO CHEW: 1.0000 | CHEWABLE_TABLET | Freq: Three times a day (TID) | ORAL | Status: AC

## 2023-10-29 MED ORDER — ADULT MULTIVITAMIN W/MINERALS CH: 1.0000 | ORAL_TABLET | Freq: Every day | ORAL | Status: AC

## 2023-10-29 MED ORDER — JUVEN PO PACK: 1.0000 | PACK | Freq: Two times a day (BID) | ORAL | 1 refills | Status: AC

## 2023-10-29 NOTE — Telephone Encounter (Signed)
 Pharmacy Patient Advocate Encounter   Received notification from Fax that prior authorization for Lidocaine  5% patches  is required/requested.   Insurance verification completed.   The patient is insured through [employer].   Per test claim: PA required; PA submitted to above mentioned insurance via Latent Key/confirmation #/EOC [id number] Status is pending

## 2023-10-29 NOTE — Progress Notes (Signed)
 [facility]                                                                               Joan Wood, is a 53 y.o. female, DOB - [birth year], FMW:[id number] Admit date - 10/03/2023    Outpatient Primary MD for the patient is Bacchus, Meade PEDLAR, FNP  LOS - 26  days    Brief summary  Joan Wood is a 53 year old female with decompensated cirrhosis, PUD with recent gastric ulcer perforation s/p ex-lap, GERD, pancreatitis, OSA who presented to [facility] with N/V/bloody diarrhea x 4 days.    She had coffee ground emesis in the ED. CTA GI neg for acute bleed, large ascites and new distal esophageal thickening.    GI consulted for acute blood loss anemia with Hg 8.3>7. She was given PPI, started on octreotide  gtt, rocephin , vitamin K , pain and nausea control and admitted to hospitalist.    Now s/p upper endoscopy showing black discoloration of esophageal mucosa concerning for acute esophageal necrosis, portal hypertension, non-bleeding gastric and duodenal ulcers.    Transferred to [facility] for further management.    Significant Events 9/24: ED for upper GI bleed admit to hospitalist  9/25: 1 U PRBC, GI upper endoscopy w/ acute esophageal necrosis with recommendation for transfer to [facility] > NPO, s/p paracentesis 3.35L drained Transferred to hospitalists service 9/27 GI signed off 9/30 10/1 US  without sufficient ascites to tap 10/3 CT with findings concerning for appendicitis, surgery recommending antibiotics (high risk for surgery).  GI reconsulted to to progressive liver failure.  10/4 diagnostic para without evidence SBP 10/5 diuretics held with doubling creatinine 10/6 para with 4 L off.  Renal consult for progressive AKI. 10/15 pt pleasantly confused, , has rectal tube and foley catheter.  10/17 palliative care on board, and goc discussions are on going .  No new changes today patient appears comfortable  Discussed with patient and patient's room mate that she is getting close  to discharge.     Assessment & Plan    Assessment and Plan:   Decompensated Alcoholic Cirrhosis Hepatic Encephalopathy - improved Ascites  Bilateral Pleural Effusions  Hypoalbuminemia  Thrombocytopenia  - S/p 5 days ceftriaxone  (now on abx with appendicitis below) -Completed trial of NAC as well - s/p paracentesis as needed; last on 10/6, 10/13 . - still remains fluid overloaded, abdomen is distended, one dose of lasix ordered.  - poor oral intake.  - palliative care consulted for GOC discussions as prognosis is poor and MELD Na Score remains high.  - patient continues to decline, more confused despite 30 g of lactulose TID, adding rifaximin.  - Ammonia elevated at 88 improved to 29. -Currently waiting for family to talk to palliative care about goals of care.   AKI - presumed ATN 2/2 contrast - not an HD candidate - nephrology signed off. - voiding trial when more stable.    Appendicitis - Appreciate gen surgery, she's high risk for surgery in setting of her cirrhosis - s/p Rocephin  and Flagyl ; plan for total of 10-day course; completed 10/13 - Repeat CT A/P performed 10/16/2023 showing unchanged appendix, 1.1 cm in caliber   Acute upper  GI bleed Acute esophageal necrosis Acute blood loss anemia Patient has h/o PUD, gastric ulcer perforation s/p sx lap 07/2023. Now s/p EGD showing grade D esophagitis, black discoloration mucosa in esophagus concerning for acute esophageal necrosis, portal hypertensive gastropathy, non bleeding gastric ulcer with clean ulcer base with apparent sutures.  No bleeding duodenal ulcers with a clean ulcer base.  No evidence of active bleeding or varices.   Transferred to [facility] given findings concerning for acute esophageal necrosis Gastrin level wnl  GI recommending carafate  x14 days, BID PPI until follow up with GI primary at [facility].    Abdominal Pain - improved  Suspect related to abdominal distension from ascites +/- possible  appendicitis  Repeat CT scan as noted    Orthostatic Hypotension Currently on Midodrine  and BP parameters remain borderline low.    Right Lower Extremity Edema Right Lower Extremity Pain LE US  - no evidence of deep vein thrombosis in lower extremity CT with moderate generalized subcutaneous edema throughout both visualized size, similar to recent pelvic CT (anasarca/third spacing vs cellulitis) She's on abx which would cover cellulitis, though lower suspicion for this Pain radiating all down leg, ? Neuropathic pain Continue gabapentin     Hypervolemic Hyponatremia/hypotonic hyponatremia Sodium has improved to 124.  Monitor.   Hypomagnesemia  Hypophosphatemia  Hypokalemia Replaced, follow up intermittently.    Tobacco abuse Continue nicotine  patch.   Alcohol abuse Continue thiamine , folate, MVT.   Severe Protein Calorie Malnutrition Body mass index is 28.95 kg/m RD   Patient continued to decline with poor oral intake, increased ammonia levels, worsening hyponatremia, anemic, thrombocytopenic, recommend palliative care consult for goals of care with the rest of the family .     RN Pressure Injury Documentation: Wound 10/15/23 0900 Pressure Injury Sacrum Mid Stage 2 -  Partial thickness loss of dermis presenting as a shallow open injury with a red, pink wound bed without slough. (Active)    Malnutrition Type:  Nutrition Problem: Severe Malnutrition Etiology: chronic illness (cirrhosis)   Malnutrition Characteristics:  Signs/Symptoms: severe fat depletion, severe muscle depletion   Nutrition Interventions:  Interventions: Ensure Enlive (each supplement provides 350kcal and 20 grams of protein), MVI, Liberalize Diet, Magic cup  Estimated body mass index is 23.79 kg/m as calculated from the following:   Height as of this encounter: 5' 2 (1.575 m).   Weight as of this encounter: 59 kg.  Code Status: full code.  DVT Prophylaxis:  SCDs Start: 10/03/23  2153   Level of Care: Level of care: Progressive Family Communication:none at bedside.   Disposition Plan:     Remains inpatient appropriate:  pending clinical improvement.   Procedures:  Paracentesis   Consultants:   Palliative care Gastroenterology.  Nephrology.  Antimicrobials:   Anti-infectives (From admission, onward)    Start     Dose/Rate Route Frequency Ordered Stop   10/29/23 0000  rifaximin (XIFAXAN) 550 MG TABS tablet        550 mg Oral 2 times daily 10/29/23 1129     10/25/23 1415  rifaximin (XIFAXAN) tablet 550 mg        550 mg Oral 2 times daily 10/25/23 1315     10/12/23 1000  metroNIDAZOLE  (FLAGYL ) tablet 500 mg        500 mg Oral Every 12 hours 10/12/23 0734 10/22/23 2125   10/12/23 0830  cefTRIAXone  (ROCEPHIN ) 2 g in sodium chloride  0.9 % 100 mL IVPB        2 g 200 mL/hr over 30 Minutes  Intravenous Every 24 hours 10/12/23 0734 10/22/23 1636   10/07/23 1000  fluconazole  (DIFLUCAN ) tablet 100 mg  Status:  Discontinued        100 mg Oral Daily 10/07/23 0853 10/07/23 0855   10/07/23 1000  fluconazole  (DIFLUCAN ) tablet 150 mg        150 mg Oral Daily 10/07/23 0855 10/07/23 1014   10/04/23 1000  cefTRIAXone  (ROCEPHIN ) 2 g in sodium chloride  0.9 % 100 mL IVPB        2 g 200 mL/hr over 30 Minutes Intravenous Every 24 hours 10/03/23 2154 10/08/23 0854   10/03/23 2015  cefTRIAXone  (ROCEPHIN ) 1 g in sodium chloride  0.9 % 100 mL IVPB        1 g 200 mL/hr over 30 Minutes Intravenous  Once 10/03/23 2010 10/03/23 2212        Medications  Scheduled Meds:  calcium  carbonate  1 tablet Oral TID WC   Chlorhexidine  Gluconate Cloth  6 each Topical Daily   feeding supplement  237 mL Oral TID BM   folic acid   1 mg Oral Daily   gabapentin   300 mg Oral TID   lactulose  30 g Oral TID   lidocaine   2 patch Transdermal Q24H   midodrine   15 mg Oral Q8H   multivitamin with minerals  1 tablet Oral Daily   nicotine   21 mg Transdermal Daily   nutrition supplement (JUVEN)  1  packet Oral BID BM   pantoprazole   40 mg Oral BID   rifaximin  550 mg Oral BID   sucralfate   1 g Oral Q6H   thiamine   100 mg Oral Daily   Continuous Infusions: PRN Meds:.albuterol , alum & mag hydroxide-simeth, artificial tears, diphenhydrAMINE , Muscle Rub, ondansetron  (ZOFRAN ) IV, mouth rinse, oxyCODONE , phenol, prochlorperazine, simethicone , witch hazel-glycerin    Subjective:   Joan Wood was seen and examined today.  Does not want to to go SNF. Wants to ho home. Unfortunately patient 's friend cannot care for her increased needs at this time.   Objective:   Vitals:   10/29/23 0508 10/29/23 0736 10/29/23 1119 10/29/23 1554  BP: (!) 94/51 107/62 (!) 92/56 97/79  Pulse: 97 100 (!) 102 99  Resp: 17 15 15 15   Temp: 99 F (37.2 C) (!) 97.5 F (36.4 C) 98.7 F (37.1 C) 98.6 F (37 C)  TempSrc: Oral Oral Oral Oral  SpO2: 98% 100% 100% 100%  Weight:      Height:        Intake/Output Summary (Last 24 hours) at 10/29/2023 1644 Last data filed at 10/29/2023 1000 Gross per 24 hour  Intake --  Output 1700 ml  Net -1700 ml   Filed Weights   10/25/23 0500 10/27/23 0500 10/28/23 0500  Weight: 59.3 kg 59 kg 59 kg     Exam General exam: Appears calm and comfortable  Respiratory system: Clear to auscultation. Respiratory effort normal. Cardiovascular system: S1 & S2 heard, RRR.  Gastrointestinal system: Abdomen is soft, distended. Bs+ rectal tube in place.  Central nervous system: Alert and oriented to person and place.  Extremities: no pedal edema.  Skin: No rashes, Psychiatry: mood is appropriate.       Data Reviewed:  I have personally reviewed following labs and imaging studies   CBC Lab Results  Component Value Date   WBC 10.8 (H) 10/28/2023   RBC 2.19 (L) 10/28/2023   HGB 7.4 (L) 10/28/2023   HCT 21.3 (L) 10/28/2023   MCV 97.3 10/28/2023   MCH  33.8 10/28/2023   PLT 144 (L) 10/28/2023   MCHC 34.7 10/28/2023   RDW 21.4 (H) 10/28/2023   LYMPHSABS 1.1  10/28/2023   MONOABS 1.1 (H) 10/28/2023   EOSABS 0.1 10/28/2023   BASOSABS 0.0 10/28/2023     Last metabolic panel Lab Results  Component Value Date   NA 124 (L) 10/29/2023   K 3.5 10/29/2023   CL 93 (L) 10/29/2023   CO2 21 (L) 10/29/2023   BUN 18 10/29/2023   CREATININE 0.54 10/29/2023   GLUCOSE 117 (H) 10/29/2023   GFRNONAA >60 10/29/2023   CALCIUM  8.8 (L) 10/29/2023   PHOS 2.6 10/17/2023   PROT 5.8 (L) 10/28/2023   ALBUMIN  2.6 (L) 10/28/2023   LABGLOB 4.4 07/30/2023   AGRATIO 1.2 03/28/2022   BILITOT 4.2 (H) 10/28/2023   ALKPHOS 65 10/28/2023   AST 77 (H) 10/28/2023   ALT 16 10/28/2023   ANIONGAP 10 10/29/2023    CBG (last 3)  Recent Labs    10/29/23 0737 10/29/23 1119 10/29/23 1554  GLUCAP 121* 126* 117*      Coagulation Profile: Recent Labs  Lab 10/25/23 0215 10/26/23 0515 10/27/23 0603 10/28/23 0645 10/29/23 0542  INR 2.5* 2.3* 2.4* 2.4* 2.3*     Radiology Studies: No results found.       Elgie Butter M.D. [facility] 10/29/2023, 4:44 PM  Available via Epic secure chat 7am-7pm After 7 pm, please refer to night coverage provider listed on amion.

## 2023-10-29 NOTE — Plan of Care (Signed)

## 2023-10-30 DIAGNOSIS — D62 Acute posthemorrhagic anemia: Secondary | ICD-10-CM | POA: Diagnosis not present

## 2023-10-30 DIAGNOSIS — Z515 Encounter for palliative care: Secondary | ICD-10-CM | POA: Diagnosis not present

## 2023-10-30 DIAGNOSIS — Z7189 Other specified counseling: Secondary | ICD-10-CM | POA: Diagnosis not present

## 2023-10-30 DIAGNOSIS — R112 Nausea with vomiting, unspecified: Secondary | ICD-10-CM | POA: Diagnosis not present

## 2023-10-30 DIAGNOSIS — F1090 Alcohol use, unspecified, uncomplicated: Secondary | ICD-10-CM | POA: Diagnosis not present

## 2023-10-30 DIAGNOSIS — K7290 Hepatic failure, unspecified without coma: Secondary | ICD-10-CM | POA: Diagnosis not present

## 2023-10-30 DIAGNOSIS — N179 Acute kidney failure, unspecified: Secondary | ICD-10-CM | POA: Diagnosis not present

## 2023-10-30 LAB — PROTIME-INR
INR: 2.2 — ABNORMAL HIGH (ref 0.8–1.2)
Prothrombin Time: 25.1 s — ABNORMAL HIGH (ref 11.4–15.2)

## 2023-10-30 LAB — GLUCOSE, CAPILLARY
Glucose-Capillary: 105 mg/dL — ABNORMAL HIGH (ref 70–99)
Glucose-Capillary: 105 mg/dL — ABNORMAL HIGH (ref 70–99)
Glucose-Capillary: 93 mg/dL (ref 70–99)

## 2023-10-30 NOTE — Progress Notes (Signed)
 [facility]                                                                               Joan Wood, is a 53 y.o. female, DOB - 10/12/1970, FMW:[id number] Admit date - 10/03/2023    Outpatient Primary MD for the patient is Bacchus, Meade PEDLAR, FNP  LOS - 27  days    Brief summary  Joan Wood is a 53 year old female with decompensated cirrhosis, PUD with recent gastric ulcer perforation s/p ex-lap, GERD, pancreatitis, OSA who presented to [facility] with N/V/bloody diarrhea x 4 days.    She had coffee ground emesis in the ED. CTA GI neg for acute bleed, large ascites and new distal esophageal thickening.    GI consulted for acute blood loss anemia with Hg 8.3>7. She was given PPI, started on octreotide  gtt, rocephin , vitamin K , pain and nausea control and admitted to hospitalist.    Now s/p upper endoscopy showing black discoloration of esophageal mucosa concerning for acute esophageal necrosis, portal hypertension, non-bleeding gastric and duodenal ulcers.    Transferred to [facility] ICU for further management.    Significant Events 9/24: ED for upper GI bleed admit to hospitalist  9/25: 1 U PRBC, GI upper endoscopy w/ acute esophageal necrosis with recommendation for transfer to [facility] > NPO, s/p paracentesis 3.35L drained Transferred to hospitalists service 9/27 GI signed off 9/30 10/1 US  without sufficient ascites to tap 10/3 CT with findings concerning for appendicitis, surgery recommending antibiotics (high risk for surgery).  GI reconsulted to to progressive liver failure.  10/4 diagnostic para without evidence SBP 10/5 diuretics held with doubling creatinine 10/6 para with 4 L off.  Renal consult for progressive AKI. 10/15 pt  with rectal tube and foley catheter.  10/17 palliative care on board, and goc discussions are on going . 10/20 Discussed with patient and patient's room mate that she is getting close to discharge. Patient wanted to go home , but she needs SNF as per  PT evaluation. Unfortunately family cannot take care of her. Patient has a daughter and 2 sons who live in [place] and unable to provide care for her.     Assessment & Plan    Assessment and Plan:   Decompensated Alcoholic Cirrhosis Hepatic Encephalopathy - improved Ascites  Bilateral Pleural Effusions  Hypoalbuminemia  Thrombocytopenia  - S/p 5 days ceftriaxone . -Completed trial of NAC as well - s/p paracentesis as needed; last on 10/6, 10/13 . - still remains fluid overloaded, abdomen is distended, repeat US  abdomen for paracentesis ordered for tomorrow. - poor oral intake.  - palliative care consulted for GOC discussions as prognosis is poor and MELD Na Score remains high.  - patient has intermittent confusion,  despite 30 g of lactulose TID, added rifaximin with some improvement.  - Ammonia elevated at 88 last week improved to 29.  -Currently waiting for family to talk to palliative care about goals of care./ disposition.  Patient needs SNF, pt she is refusing to go.    AKI - presumed ATN 2/2 contrast - not an HD candidate - nephrology signed off. - voiding trial when more stable.    Appendicitis - Appreciate  gen surgery, she's high risk for surgery in setting of her cirrhosis - s/p Rocephin  and Flagyl ; plan for total of 10-day course; completed 10/13 - Repeat CT A/P performed 10/16/2023 showing unchanged appendix, 1.1 cm in caliber   Acute upper GI bleed Acute esophageal necrosis Acute blood loss anemia Patient has h/o PUD, gastric ulcer perforation s/p sx lap 07/2023. Now s/p EGD showing grade D esophagitis, black discoloration mucosa in esophagus concerning for acute esophageal necrosis, portal hypertensive gastropathy, non bleeding gastric ulcer with clean ulcer base with apparent sutures.  No bleeding duodenal ulcers with a clean ulcer base.  No evidence of active bleeding or varices.   Transferred to [facility] given findings concerning for acute esophageal  necrosis Gastrin level wnl  GI recommending carafate  x14 days, BID PPI until follow up with GI primary at [facility].    Abdominal Pain - improved  Suspect related to abdominal distension from ascites +/- possible appendicitis     Orthostatic Hypotension Currently on Midodrine  and BP parameters remain borderline low.    Right Lower Extremity Edema Right Lower Extremity Pain LE US  - no evidence of deep vein thrombosis in lower extremity CT with moderate generalized subcutaneous edema throughout both visualized size, similar to recent pelvic CT (anasarca/third spacing vs cellulitis) She's on abx which would cover cellulitis, though lower suspicion for this Pain radiating all down leg, ? Neuropathic pain Continue gabapentin     Hypervolemic Hyponatremia/hypotonic hyponatremia Sodium has been between 121 to 129.  Monitor.   Hypomagnesemia  Hypophosphatemia  Hypokalemia Replaced, follow up intermittently.    Tobacco abuse Continue nicotine  patch.   Alcohol abuse Continue thiamine , folate, MVT.   Severe Protein Calorie Malnutrition Body mass index is 28.95 kg/m RD    RN Pressure Injury Documentation: Wound 10/15/23 0900 Pressure Injury Sacrum Mid Stage 2 -  Partial thickness loss of dermis presenting as a shallow open injury with a red, pink wound bed without slough. (Active)    Malnutrition Type:  Nutrition Problem: Severe Malnutrition Etiology: chronic illness (cirrhosis)   Malnutrition Characteristics:  Signs/Symptoms: severe fat depletion, severe muscle depletion   Nutrition Interventions:  Interventions: Ensure Enlive (each supplement provides 350kcal and 20 grams of protein), MVI, Liberalize Diet, Magic cup  Estimated body mass index is 23.79 kg/m as calculated from the following:   Height as of this encounter: 5' 2 (1.575 m).   Weight as of this encounter: 59 kg.  Code Status: full code.  DVT Prophylaxis:  SCDs Start: 10/03/23 2153   Level of  Care: Level of care: Progressive Family Communication:none at bedside.   Disposition Plan:     Remains inpatient appropriate:  pending clinical improvement.   Procedures:  Paracentesis   Consultants:   Palliative care Gastroenterology.  Nephrology.  Antimicrobials:   Anti-infectives (From admission, onward)    Start     Dose/Rate Route Frequency Ordered Stop   10/29/23 0000  rifaximin (XIFAXAN) 550 MG TABS tablet        550 mg Oral 2 times daily 10/29/23 1129     10/25/23 1415  rifaximin (XIFAXAN) tablet 550 mg        550 mg Oral 2 times daily 10/25/23 1315     10/12/23 1000  metroNIDAZOLE  (FLAGYL ) tablet 500 mg        500 mg Oral Every 12 hours 10/12/23 0734 10/22/23 2125   10/12/23 0830  cefTRIAXone  (ROCEPHIN ) 2 g in sodium chloride  0.9 % 100 mL IVPB        2  g 200 mL/hr over 30 Minutes Intravenous Every 24 hours 10/12/23 0734 10/22/23 1636   10/07/23 1000  fluconazole  (DIFLUCAN ) tablet 100 mg  Status:  Discontinued        100 mg Oral Daily 10/07/23 0853 10/07/23 0855   10/07/23 1000  fluconazole  (DIFLUCAN ) tablet 150 mg        150 mg Oral Daily 10/07/23 0855 10/07/23 1014   10/04/23 1000  cefTRIAXone  (ROCEPHIN ) 2 g in sodium chloride  0.9 % 100 mL IVPB        2 g 200 mL/hr over 30 Minutes Intravenous Every 24 hours 10/03/23 2154 10/08/23 0854   10/03/23 2015  cefTRIAXone  (ROCEPHIN ) 1 g in sodium chloride  0.9 % 100 mL IVPB        1 g 200 mL/hr over 30 Minutes Intravenous  Once 10/03/23 2010 10/03/23 2212        Medications  Scheduled Meds:  calcium  carbonate  1 tablet Oral TID WC   Chlorhexidine  Gluconate Cloth  6 each Topical Daily   feeding supplement  237 mL Oral TID BM   folic acid   1 mg Oral Daily   gabapentin   300 mg Oral TID   lactulose  30 g Oral TID   lidocaine   2 patch Transdermal Q24H   midodrine   15 mg Oral Q8H   multivitamin with minerals  1 tablet Oral Daily   nicotine   21 mg Transdermal Daily   nutrition supplement (JUVEN)  1 packet Oral BID BM    pantoprazole   40 mg Oral BID   rifaximin  550 mg Oral BID   sucralfate   1 g Oral Q6H   thiamine   100 mg Oral Daily   Continuous Infusions: PRN Meds:.albuterol , alum & mag hydroxide-simeth, artificial tears, diphenhydrAMINE , Muscle Rub, ondansetron  (ZOFRAN ) IV, mouth rinse, oxyCODONE , phenol, prochlorperazine, simethicone , witch hazel-glycerin    Subjective:   Joan Wood was seen and examined today.   No new complaints.    Objective:   Vitals:   10/30/23 0554 10/30/23 0730 10/30/23 1120 10/30/23 1600  BP: (!) 89/57 100/61 95/63 (!) 91/57  Pulse: 92 93 92 98  Resp: 16 14 16 16   Temp: 97.7 F (36.5 C) 97.9 F (36.6 C) 98.2 F (36.8 C) 99.2 F (37.3 C)  TempSrc: Oral Oral Oral Oral  SpO2:  96% 100% 100%  Weight:      Height:        Intake/Output Summary (Last 24 hours) at 10/30/2023 1659 Last data filed at 10/30/2023 0947 Gross per 24 hour  Intake 1065 ml  Output 1325 ml  Net -260 ml   Filed Weights   10/25/23 0500 10/27/23 0500 10/28/23 0500  Weight: 59.3 kg 59 kg 59 kg     Exam General exam: ill appearing lady, not in distress.  Respiratory system: Clear to auscultation. Respiratory effort normal. Cardiovascular system: S1 & S2 heard, RRR. No JVD,  Gastrointestinal system: Abdomen is soft, distended, bs+ Central nervous system: Alert and oriented to person and place.  Extremities: no pedal edema.  Skin: No rashes, jaundiced.  Psychiatry: mood is appropriate.        Data Reviewed:  I have personally reviewed following labs and imaging studies   CBC Lab Results  Component Value Date   WBC 10.8 (H) 10/28/2023   RBC 2.19 (L) 10/28/2023   HGB 7.4 (L) 10/28/2023   HCT 21.3 (L) 10/28/2023   MCV 97.3 10/28/2023   MCH 33.8 10/28/2023   PLT 144 (L) 10/28/2023   MCHC 34.7  10/28/2023   RDW 21.4 (H) 10/28/2023   LYMPHSABS 1.1 10/28/2023   MONOABS 1.1 (H) 10/28/2023   EOSABS 0.1 10/28/2023   BASOSABS 0.0 10/28/2023     Last metabolic panel Lab  Results  Component Value Date   NA 124 (L) 10/29/2023   K 3.5 10/29/2023   CL 93 (L) 10/29/2023   CO2 21 (L) 10/29/2023   BUN 18 10/29/2023   CREATININE 0.54 10/29/2023   GLUCOSE 117 (H) 10/29/2023   GFRNONAA >60 10/29/2023   CALCIUM  8.8 (L) 10/29/2023   PHOS 2.6 10/17/2023   PROT 5.8 (L) 10/28/2023   ALBUMIN  2.6 (L) 10/28/2023   LABGLOB 4.4 07/30/2023   AGRATIO 1.2 03/28/2022   BILITOT 4.2 (H) 10/28/2023   ALKPHOS 65 10/28/2023   AST 77 (H) 10/28/2023   ALT 16 10/28/2023   ANIONGAP 10 10/29/2023    CBG (last 3)  Recent Labs    10/30/23 0026 10/30/23 0340 10/30/23 0730  GLUCAP 105* 105* 93      Coagulation Profile: Recent Labs  Lab 10/26/23 0515 10/27/23 0603 10/28/23 0645 10/29/23 0542 10/30/23 0205  INR 2.3* 2.4* 2.4* 2.3* 2.2*     Radiology Studies: No results found.       Elgie Butter M.D. [facility] 10/30/2023, 4:59 PM  Available via Epic secure chat 7am-7pm After 7 pm, please refer to night coverage provider listed on amion.

## 2023-10-30 NOTE — Progress Notes (Signed)
 Nutrition Follow-up  DOCUMENTATION CODES:  Severe malnutrition in context of chronic illness  INTERVENTION:  Continue current diet as ordered Encourage PO intake Continue ordering assistance Continue Ensure Plus High Protein po TID, each supplement provides 350 kcal and 20 grams of protein. 1 packet Juven BID, each packet provides 95 calories, 2.5 grams of protein (collagen),  to support wound healing Continue MVI with minerals daily, thiamine  and folic acid  for alcohol use history Recommend discontinuing CBG checks   NUTRITION DIAGNOSIS:  Severe Malnutrition related to chronic illness (cirrhosis) as evidenced by severe fat depletion, severe muscle depletion. - Ongoing   GOAL:  Patient will meet greater than or equal to 90% of their needs - Progressing   MONITOR:  PO intake, Supplement acceptance, Labs, I & O's  REASON FOR ASSESSMENT:  Consult (low BMI) Assessment of nutrition requirement/status  ASSESSMENT:  Pt with hx of cirrhosis, perforated gastric ulcers in July s/p repair, recent GIB, h/o pancreatitis, alcohol use disorder, and GERD presented to [facility] ED with N/V/D and poor PO intake. Workup concerning for GI bleed  9/24 - presented to [facility] ED 9/25 - EGD showed necrotic appearing severe esophagitis in the mid and distal esophagus. Severe portal gastropathy. One 2 cm gastric ulcer and multiple superficial duodenal ulcers with clean bases, transferred to [facility] MICU, paracentesis removed 10/4 - diagnostic paracentesis of bright yellow fluid removed 10/6 - paracentesis 4L removed 10/13 - Paracentesis 3.3L removed    Pt resting in bed at the time of assessment. In good spirits today, but wants to go home. Lunch tray had just arrived. Pt with two completed ensure on bedside table. Pt states that she doesn't love the juven but she is drinking it. Will continue current interventions.  Per chart review pt approaching discharge ready.  Admit weight:  56.7 kg Current weight: 59 kg  Weight artificially elevated due to ascites  Average Meal Intake: 9/27-9/29: 65% intake x 3 recorded meals 10/2-10/5: 22% intake x 3 recorded meals  Nutritionally Relevant Medications: Scheduled Meds:  calcium  carbonate  1 tablet Oral TID WC   Ensure Plus High Protein  237 mL Oral TID BM   folic acid   1 mg Oral Daily   lactulose  30 g Oral TID   multivitamin with minerals  1 tablet Oral Daily   JUVEN  1 packet Oral BID BM   pantoprazole   40 mg Oral BID   rifaximin  550 mg Oral BID   sucralfate   1 g Oral Q6H   thiamine   100 mg Oral Daily   PRN Meds: alum & mag hydroxide-simeth, diphenhydrAMINE , ondansetron , phenol, prochlorperazine, simethicone   Labs Reviewed from 10/20: Sodium 124, chloride 93 CBG ranges from 93-126 mg/dL over the last 24 hours HgbA1c 4.5  Diet Order:   Diet Order             Diet 2 gram sodium Room service appropriate? Yes with Assist; Fluid consistency: Thin; Fluid restriction: 2000 mL Fluid  Diet effective now                   EDUCATION NEEDS:  Education needs have been addressed  Skin:  Skin Assessment: Skin Integrity Issues: Skin Integrity Issues:: Stage II Stage II: Sacrum (2 x 0.6 cm)  Last BM:  10/14, type 7 FMS placed 10/13, 1,549mL out yesterday  Height:  Ht Readings from Last 1 Encounters:  10/04/23 5' 2 (1.575 m)    Weight:  Wt Readings from Last 1 Encounters:  10/28/23 59 kg    Ideal Body Weight:  50 kg  BMI:  Body mass index is 23.79 kg/m.  Estimated Nutritional Needs:  Kcal:  1600-1800 kcal/d Protein:  85-100g/d Fluid:  1.5L/d   Joan Wood, RD, LDN, CNSC Registered Dietitian II Please reach out via secure chat

## 2023-10-30 NOTE — Plan of Care (Signed)
  Problem: Health Behavior/Discharge Planning: Goal: Ability to manage health-related needs will improve Outcome: Not Progressing   Problem: Clinical Measurements: Goal: Ability to maintain clinical measurements within normal limits will improve Outcome: Not Progressing Goal: Will remain free from infection Outcome: Not Progressing Goal: Diagnostic test results will improve Outcome: Not Progressing Goal: Respiratory complications will improve Outcome: Not Progressing Goal: Cardiovascular complication will be avoided Outcome: Not Progressing   Problem: Activity: Goal: Risk for activity intolerance will decrease Outcome: Not Progressing   Problem: Nutrition: Goal: Adequate nutrition will be maintained Outcome: Not Progressing   Problem: Coping: Goal: Level of anxiety will decrease Outcome: Not Progressing   Problem: Elimination: Goal: Will not experience complications related to bowel motility Outcome: Not Progressing Goal: Will not experience complications related to urinary retention Outcome: Not Progressing   Problem: Pain Managment: Goal: General experience of comfort will improve and/or be controlled Outcome: Not Progressing   Problem: Safety: Goal: Ability to remain free from injury will improve Outcome: Not Progressing   Problem: Skin Integrity: Goal: Risk for impaired skin integrity will decrease Outcome: Not Progressing

## 2023-10-30 NOTE — Progress Notes (Signed)
 Physical Therapy Treatment Patient Details Name: Joan Wood MRN: [id number] DOB: 09-Sep-1970 Today's Date: 10/30/2023   History of Present Illness 53 y.o. female presents to [facility] hospital on 10/03/2023 with nausea/vomiting and bloody diarrhea. Pt underwent upper EGD on 9/25, concerning for possible acute esophageal necrosis. Pt also underwent paracentesis on 9/25 and 10/6. PMH includes decompensated cirrhosis, PUD, GERD, pancreatitis, OSA.    PT Comments  Pt received in supine and agreeable to session. Pt noted to be soiled and requires assist with hygiene tasks and linen change. Pt requires increased time and cues to complete mobility tasks due to impaired problem solving and weakness. Pt demonstrates difficulty with multistep commands and becomes overwhelmed and frustrated requiring redirection. Pt requires cues for awareness and safety as at one point pt sat at the very edge of the bed despite cues requiring increased assist to scoot back on bed. Pt able to perform multiple standing trials and a few lateral steps along EOB, but demonstrates limited standing tolerance due to bottom pain. Pt reports no dizziness throughout session and BP stable. Pt continues to benefit from PT services to progress toward functional mobility goals.  BP longsitting in bed: 95/63 BP sitting EOB after standing: 104/63   If plan is discharge home, recommend the following: A lot of help with walking and/or transfers;A lot of help with bathing/dressing/bathroom;Assistance with cooking/housework;Assist for transportation;Help with stairs or ramp for entrance   Can travel by private vehicle     No  Equipment Recommendations  Hospital bed;BSC/3in1    Recommendations for Other Services       Precautions / Restrictions Precautions Precautions: Fall Recall of Precautions/Restrictions: Impaired Precaution/Restrictions Comments: orthostatic, foley, flexiseal Restrictions Weight Bearing Restrictions Per Provider Order:  No     Mobility  Bed Mobility Overal bed mobility: Needs Assistance Bed Mobility: Supine to Sit, Sit to Supine     Supine to sit: Min assist, HOB elevated, Used rails Sit to supine: Contact guard assist   General bed mobility comments: Increased time and effort. Min A to scoot forward at EOB    Transfers Overall transfer level: Needs assistance Equipment used: Rolling walker (2 wheels) Transfers: Sit to/from Stand Sit to Stand: Min assist           General transfer comment: STS from EOB x4 with min A for power up and RW stabilization. Cues for safety due to pt scooting very close to edge of EOB. Pt able to take a few lateral steps towards HOB. Pt sits back to EOB quickly twice due to bottom pain    Ambulation/Gait                   Stairs             Wheelchair Mobility     Tilt Bed    Modified Rankin (Stroke Patients Only)       Balance Overall balance assessment: Needs assistance Sitting-balance support: No upper extremity supported, Feet supported, Bilateral upper extremity supported Sitting balance-Leahy Scale: Fair Sitting balance - Comments: CGA sitting EOB statically   Standing balance support: Bilateral upper extremity supported, Reliant on assistive device for balance, During functional activity Standing balance-Leahy Scale: Poor Standing balance comment: with RW support                            Communication Communication Communication: No apparent difficulties  Cognition Arousal: Alert Behavior During Therapy: Lability   PT - Cognitive impairments:  No family/caregiver present to determine baseline, Awareness, Memory, Attention, Sequencing, Problem solving, Safety/Judgement, Initiation                       PT - Cognition Comments: Pt becomes overwhelmed and agitated quickly with multistep commands, but able to redirect. Increased time for processing and initiation Following commands: Impaired Following  commands impaired: Follows multi-step commands inconsistently, Follows one step commands with increased time    Cueing Cueing Techniques: Verbal cues, Visual cues, Tactile cues  Exercises      General Comments        Pertinent Vitals/Pain Pain Assessment Pain Assessment: Faces Faces Pain Scale: Hurts whole lot Pain Location: anus (from flexiseal) Pain Descriptors / Indicators: Discomfort, Sore, Grimacing Pain Intervention(s): Limited activity within patient's tolerance, Monitored during session, Repositioned     PT Goals (current goals can now be found in the care plan section) Acute Rehab PT Goals Patient Stated Goal: to return to independence PT Goal Formulation: With patient Time For Goal Achievement: 11/02/23 Progress towards PT goals: Progressing toward goals    Frequency    Min 2X/week       AM-PAC PT 6 Clicks Mobility   Outcome Measure  Help needed turning from your back to your side while in a flat bed without using bedrails?: A Little Help needed moving from lying on your back to sitting on the side of a flat bed without using bedrails?: A Little Help needed moving to and from a bed to a chair (including a wheelchair)?: A Little Help needed standing up from a chair using your arms (e.g., wheelchair or bedside chair)?: A Little Help needed to walk in hospital room?: Total Help needed climbing 3-5 steps with a railing? : Total 6 Click Score: 14    End of Session Equipment Utilized During Treatment: Gait belt Activity Tolerance: Patient limited by pain;Patient tolerated treatment well Patient left: in bed;with call bell/phone within reach;with bed alarm set Nurse Communication: Mobility status PT Visit Diagnosis: Other abnormalities of gait and mobility (R26.89);Muscle weakness (generalized) (M62.81)     Time: 8953-8867 PT Time Calculation (min) (ACUTE ONLY): 46 min  Charges:    $Therapeutic Activity: 38-52 mins PT General Charges $$ ACUTE PT  VISIT: 1 Visit                     Darryle George, PTA Acute Rehabilitation Services Secure Chat Preferred  [phone]    Darryle George 10/30/2023, 1:16 PM

## 2023-10-30 NOTE — Telephone Encounter (Signed)
 Pharmacy Patient Advocate Encounter  Received notification from [employer] that Prior Authorization for Lidocaine  5% Patches has been APPROVED from 10/29/2023 to 10/28/2024   PA #/Case ID/Reference #: [id number]

## 2023-10-30 NOTE — Progress Notes (Signed)
 Patient ID: Joan Wood, female   DOB: 01/11/1970, 53 y.o.   MRN: [id number]    Progress Note from the Palliative Medicine Team at [facility]   Patient Name: Joan Wood        Date: 10/30/2023 DOB: 1970-06-08  Age: 53 y.o. MRN#: [id number] Attending Physician: Cherlyn Labella, MD Primary Care Physician: Edman Meade PEDLAR, FNP Admit Date: 10/03/2023   Reason for Consultation/Follow-up   Establishing Goals of Care   HPI/ Brief Hospital Review  Joan Wood is a 53 year old female with decompensated cirrhosis, PUD with recent gastric ulcer perforation s/p ex-lap, GERD, pancreatitis, OSA who presented to [facility] with N/V/bloody diarrhea x 4 days.    She had coffee ground emesis in the ED. CTA GI neg for acute bleed, large ascites and new distal esophageal thickening.    GI consulted for acute blood loss anemia with Hg 8.3>7. She was given PPI, started on octreotide  gtt, rocephin , vitamin K , pain and nausea control and admitted to hospitalist.    Now s/p upper endoscopy showing black discoloration of esophageal mucosa concerning for acute esophageal necrosis, portal hypertension, non-bleeding gastric and duodenal ulcers.    Transferred to [facility] ICU for further management.    Significant Events 9/24: ED for upper GI bleed admit to hospitalist  9/25: 1 U PRBC, GI upper endoscopy w/ acute esophageal necrosis with recommendation for transfer to [facility] > NPO, s/p paracentesis 3.35L drained Transferred to hospitalists service 9/27 GI signed off 9/30 10/1 US  without sufficient ascites to tap 10/3 CT with findings concerning for appendicitis, surgery recommending antibiotics (high risk for surgery).  GI reconsulted to to progressive liver failure.  10/4 diagnostic para without evidence SBP 10/5 diuretics held with doubling creatinine 10/6 para with 4 L off.  Renal consult for progressive AKI. 10/15 pt pleasantly confused, , has rectal tube and foley catheter.  10/17 palliative care on board, and  goc discussions are on going .  No new changes today patient appears comfortable  Discussed with patient and patient's room mate that she is getting close to discharge.      Subjective  Extensive chart review has been completed prior to meeting with patient/family  including labs, vital signs, imaging, progress/consult notes, orders, medications and available advance directive documents.    This NP assessed patient at the bedside as a follow up for palliaitv emecine needs        Education offered today regarding  the importance of continued conversation with family and their  medical providers regarding overall plan of care and treatment options,  ensuring decisions are within the context of the patients values and GOCs.  Questions and concerns addressed   Discussed with primary team and nursing staff   Time: 50  minutes  Detailed review of medical records ( labs, imaging, vital signs), medically appropriate exam ( MS, skin, cardiac,  resp)   discussed with treatment team, counseling and education to patient, family, staff, documenting clinical information, medication management, coordination of care    Ronal Plants NP  Palliative Medicine Team Team Phone # [phone] Pager [phone]

## 2023-10-31 ENCOUNTER — Other Ambulatory Visit (HOSPITAL_COMMUNITY): Payer: Self-pay

## 2023-10-31 ENCOUNTER — Inpatient Hospital Stay (HOSPITAL_COMMUNITY)

## 2023-10-31 DIAGNOSIS — R188 Other ascites: Secondary | ICD-10-CM | POA: Diagnosis not present

## 2023-10-31 DIAGNOSIS — K7290 Hepatic failure, unspecified without coma: Secondary | ICD-10-CM | POA: Diagnosis not present

## 2023-10-31 DIAGNOSIS — K7460 Unspecified cirrhosis of liver: Secondary | ICD-10-CM | POA: Diagnosis not present

## 2023-10-31 HISTORY — PX: IR PARACENTESIS: IMG2679

## 2023-10-31 LAB — CBC WITH DIFFERENTIAL/PLATELET
Abs Immature Granulocytes: 0.07 K/uL (ref 0.00–0.07)
Basophils Absolute: 0 K/uL (ref 0.0–0.1)
Basophils Relative: 0 %
Eosinophils Absolute: 0.1 K/uL (ref 0.0–0.5)
Eosinophils Relative: 1 %
HCT: 21.7 % — ABNORMAL LOW (ref 36.0–46.0)
Hemoglobin: 7.7 g/dL — ABNORMAL LOW (ref 12.0–15.0)
Immature Granulocytes: 1 %
Lymphocytes Relative: 14 %
Lymphs Abs: 1.3 K/uL (ref 0.7–4.0)
MCH: 33.3 pg (ref 26.0–34.0)
MCHC: 35.5 g/dL (ref 30.0–36.0)
MCV: 93.9 fL (ref 80.0–100.0)
Monocytes Absolute: 1.3 K/uL — ABNORMAL HIGH (ref 0.1–1.0)
Monocytes Relative: 13 %
Neutro Abs: 6.9 K/uL (ref 1.7–7.7)
Neutrophils Relative %: 71 %
Platelets: 134 K/uL — ABNORMAL LOW (ref 150–400)
RBC: 2.31 MIL/uL — ABNORMAL LOW (ref 3.87–5.11)
RDW: 20.4 % — ABNORMAL HIGH (ref 11.5–15.5)
WBC: 9.7 K/uL (ref 4.0–10.5)
nRBC: 0 % (ref 0.0–0.2)

## 2023-10-31 LAB — BASIC METABOLIC PANEL WITH GFR
Anion gap: 10 (ref 5–15)
BUN: 21 mg/dL — ABNORMAL HIGH (ref 6–20)
CO2: 19 mmol/L — ABNORMAL LOW (ref 22–32)
Calcium: 8.6 mg/dL — ABNORMAL LOW (ref 8.9–10.3)
Chloride: 92 mmol/L — ABNORMAL LOW (ref 98–111)
Creatinine, Ser: 0.65 mg/dL (ref 0.44–1.00)
GFR, Estimated: >60 mL/min (ref >60)
Glucose, Bld: 99 mg/dL (ref 70–99)
Potassium: 2.9 mmol/L — ABNORMAL LOW (ref 3.5–5.1)
Sodium: 121 mmol/L — ABNORMAL LOW (ref 135–145)

## 2023-10-31 LAB — PROTIME-INR
INR: 2.2 — ABNORMAL HIGH (ref 0.8–1.2)
Prothrombin Time: 25.4 s — ABNORMAL HIGH (ref 11.4–15.2)

## 2023-10-31 LAB — POTASSIUM: Potassium: 2.8 mmol/L — ABNORMAL LOW (ref 3.5–5.1)

## 2023-10-31 MED ORDER — LIDOCAINE-EPINEPHRINE 1 %-1:100000 IJ SOLN
INTRAMUSCULAR | Status: AC
  Filled 2023-10-31: qty 1

## 2023-10-31 NOTE — Procedures (Signed)
 PROCEDURE SUMMARY:  Successful ultrasound guided paracentesis from the right lower quadrant.  Yielded 4.7 L of clear yellow fluid.  No immediate complications.  The patient tolerated the procedure well.   Specimen not sent for labs.  EBL < 5mL  The patient has required >/=2 paracenteses in a 30 day period and a screening evaluation by the [facility] Inter[facility] has been arranged.  Warren Dais, AGACNP-BC 10/31/2023, 12:59 PM

## 2023-10-31 NOTE — Progress Notes (Signed)
 Occupational Therapy Treatment Patient Details Name: Joan Wood MRN: [id number] DOB: 1970/09/02 Today's Date: 10/31/2023   History of present illness 53 y.o. female presents to [facility] hospital on 10/03/2023 with nausea/vomiting and bloody diarrhea. Pt underwent upper EGD on 9/25, concerning for possible acute esophageal necrosis. Pt also underwent paracentesis on 9/25 and 10/6. PMH includes decompensated cirrhosis, PUD, GERD, pancreatitis, OSA.   OT comments  Patient received long sitting in bed, pleasant and agreeable to OT session. Patient demonstrating gains with bed mobility with patient able to get to EOB with min assist.  Patient able to perform grooming tasks while seated on EOB with supervision for balance and assistance with combing hair.  Patient able to perform 3 sit stands from EOB to RW with min assist and able to take side steps towards HOB.  Patient returned to supine with min assist.  Patient will benefit from continued inpatient follow up therapy, <3 hours/day.  Acute OT to continue to follow to address established goals to facilitate DC to next venue of care.        If plan is discharge home, recommend the following:  Direct supervision/assist for medications management;Direct supervision/assist for financial management;Two people to help with walking and/or transfers;Two people to help with bathing/dressing/bathroom;Supervision due to cognitive status   Equipment Recommendations  BSC/3in1    Recommendations for Other Services      Precautions / Restrictions Precautions Precautions: Fall Recall of Precautions/Restrictions: Impaired Precaution/Restrictions Comments: orthostatic, foley Restrictions Weight Bearing Restrictions Per Provider Order: No       Mobility Bed Mobility Overal bed mobility: Needs Assistance Bed Mobility: Supine to Sit, Sit to Supine     Supine to sit: Min assist, HOB elevated, Used rails Sit to supine: Min assist   General bed mobility  comments: assistance with scooting to EOB and assistance with BLE to return to supine    Transfers Overall transfer level: Needs assistance Equipment used: Rolling walker (2 wheels) Transfers: Sit to/from Stand Sit to Stand: Min assist           General transfer comment: sit to stands performed from EOB with min assist to stand and to for balance. patient able to take side steps towards [facility]     Balance Overall balance assessment: Needs assistance Sitting-balance support: No upper extremity supported, Feet supported, Bilateral upper extremity supported Sitting balance-Leahy Scale: Fair Sitting balance - Comments: supervision seated on EOB   Standing balance support: Bilateral upper extremity supported, Reliant on assistive device for balance, During functional activity Standing balance-Leahy Scale: Poor Standing balance comment: reliant on BUE support on EOB                           ADL either performed or assessed with clinical judgement   ADL Overall ADL's : Needs assistance/impaired Eating/Feeding: Set up;Sitting   Grooming: Wash/dry hands;Wash/dry face;Brushing hair;Minimal assistance;Sitting Grooming Details (indicate cue type and reason): assistance with brushinghair but unable to complete due to matted in back                               General ADL Comments: improvement with self care tasks seated on EOB    Extremity/Trunk Assessment              Vision       Perception     Praxis     Communication Communication Communication: No apparent difficulties  Cognition Arousal: Alert Behavior During Therapy: Anxious, Impulsive Cognition: Cognition impaired   Orientation impairments: Situation Awareness: Intellectual awareness impaired Memory impairment (select all impairments): Short-term memory, Working memory Attention impairment (select first level of impairment): Sustained attention Executive functioning impairment (select  all impairments): Organization, Reasoning, Problem solving                   Following commands: Impaired Following commands impaired: Follows multi-step commands inconsistently, Follows one step commands with increased time      Cueing   Cueing Techniques: Verbal cues, Visual cues, Tactile cues  Exercises      Shoulder Instructions       General Comments VSS on RA    Pertinent Vitals/ Pain       Pain Assessment Pain Assessment: Faces Faces Pain Scale: Hurts little more Pain Location: back Pain Descriptors / Indicators: Aching, Grimacing Pain Intervention(s): Limited activity within patient's tolerance, Monitored during session, Repositioned  Home Living                                          Prior Functioning/Environment              Frequency  Min 2X/week        Progress Toward Goals  OT Goals(current goals can now be found in the care plan section)  Progress towards OT goals: Progressing toward goals  Acute Rehab OT Goals Patient Stated Goal: to go home OT Goal Formulation: With patient Time For Goal Achievement: 11/05/23 Potential to Achieve Goals: Fair ADL Goals Pt Will Perform Grooming: with set-up;sitting Pt Will Perform Lower Body Dressing: with supervision;sitting/lateral leans;sit to/from stand;with adaptive equipment Pt Will Transfer to Toilet: with min assist;bedside commode Pt Will Perform Toileting - Clothing Manipulation and hygiene: with supervision;sit to/from stand;sitting/lateral leans Pt/caregiver will Perform Home Exercise Program: Both right and left upper extremity;With written HEP provided;With theraband  Plan      Co-evaluation                 AM-PAC OT 6 Clicks Daily Activity     Outcome Measure   Help from another person eating meals?: A Little Help from another person taking care of personal grooming?: A Little Help from another person toileting, which includes using toliet, bedpan, or  urinal?: A Lot Help from another person bathing (including washing, rinsing, drying)?: A Lot Help from another person to put on and taking off regular upper body clothing?: A Lot Help from another person to put on and taking off regular lower body clothing?: Total 6 Click Score: 13    End of Session Equipment Utilized During Treatment: Gait belt;Rolling walker (2 wheels)  OT Visit Diagnosis: Other abnormalities of gait and mobility (R26.89);Muscle weakness (generalized) (M62.81);Pain;Other symptoms and signs involving cognitive function Pain - part of body:  (back)   Activity Tolerance Patient tolerated treatment well   Patient Left in bed;with call bell/phone within reach;with bed alarm set;with family/visitor present   Nurse Communication Mobility status        Time: 1100-1132 OT Time Calculation (min): 32 min  Charges: OT General Charges $OT Visit: 1 Visit OT Treatments $Self Care/Home Management : 8-22 mins $Therapeutic Activity: 8-22 mins  Dick Laine, OTA Acute Rehabilitation Services  Office [phone]   Jeb LITTIE Laine 10/31/2023, 2:36 PM

## 2023-10-31 NOTE — Progress Notes (Signed)
 PT scored a yellow MEWS while off of unit in Interventional Radiology. Score driven by Systolic B/P and HR.    10/31/23 0841  Assess: MEWS Score  BP (!) 100/54 (post-para)  Assess: MEWS Score  MEWS Temp 0  MEWS Systolic 1  MEWS Pulse 1  MEWS RR 0  MEWS LOC 0  MEWS Score 2  MEWS Score Color Yellow

## 2023-10-31 NOTE — Plan of Care (Signed)
  Problem: Clinical Measurements: Goal: Will remain free from infection Outcome: Progressing Goal: Respiratory complications will improve Outcome: Progressing   Problem: Nutrition: Goal: Adequate nutrition will be maintained Outcome: Progressing   Problem: Health Behavior/Discharge Planning: Goal: Ability to manage health-related needs will improve Outcome: Not Progressing

## 2023-10-31 NOTE — Progress Notes (Signed)
 PROGRESS NOTE    Joan Wood  FMW:[id number] DOB: 02-19-1970 DOA: 10/03/2023 PCP: Edman Meade PEDLAR, FNP   Brief Narrative:    Assessment and Plan:  Decompensated alcoholic cirrhosis with ascites Patient treated empirically with Ceftriaxone  for possible SBP, although ascites fluid cell count not consistent with diagnosis. Palliative care consulted for ongoing goals of care discussions. -Continue to treat hepatic encephalopathy -Continue paracenteses as needed for symptoms  Hepatic encephalopathy Improved with lactulose and Rifaximin. Patient's insurance will not cover Rifaximin on discharge. -Continue lactulose -When plan for discharge developed, will trial off Rifaximin  Hypoalbuminemia Secondary to underlying liver disease.  Thrombocytopenia Secondary to underlying liver disease.   AKI Presumed secondary to ATN from contrast. Nephrology was consulted for management. Renal function improved. Patient is not an HD candidate.  Appendicitis General surgery consulted. Patient high-risk for surgical management with recommendation for conservative management. Patient completed antibiotic regimen.  Acute upper GI bleed Acute esophageal necrosis Acute blood loss anemia Patient seen and evaluated by gastroenterology. Upper GI endoscopy performed on 9/25 with evidence of grade D esophagitis in addition to concern for acute esophageal necrosis. Non-bleeding gastric ulcer noted with sutures. For esophageal necrosis, GI recommended carafate  for two weeks in addition to Protonix  BID and outpatient GI follow-up.  Abdominal pain Presumed secondary to abdominal distension from ascites. Symptoms resolved.  Lower extremity edema VTE ruled out. Likely related to hypoalbuminemia from liver disease.  Hypervolemic hyponatremia Sodium is stable.  Hypomagnesemia Recheck magnesium  in AM  Hypokalemia -Potassium supplementation -Recheck potassium in AM  Tobacco abuse -Continue  nicotine  patch  Alcohol abuse Noted.  Severe malnutrition -Dietitian recommendations (10/21): Continue current diet as ordered Encourage PO intake Continue ordering assistance Continue Ensure Plus High Protein po TID, each supplement provides 350 kcal and 20 grams of protein. 1 packet Juven BID, each packet provides 95 calories, 2.5 grams of protein (collagen),  to support wound healing Continue MVI with minerals daily, thiamine  and folic acid  for alcohol use history  Pressure injury Mid sacrum. Unclear if present on admission.   DVT prophylaxis: SCDs Code Status:   Code Status: Full Code Family Communication: None at bedside. Patient declines for me to update her family. Disposition Plan: Discharge pending safe discharge plan   Consultants:  Palliative care medicine Gastroenterology  Procedures:    Antimicrobials:     Subjective: Patient reports nausea and diarrhea from overnight.  Objective: BP (!) 100/54 Comment: post-para  Pulse (!) 101   Temp 98 F (36.7 C) (Oral)   Resp 16   Ht 5' 2 (1.575 m)   Wt 59 kg   SpO2 100%   BMI 23.79 kg/m   Examination:  General exam: Appears calm and comfortable. Respiratory system: Clear to auscultation. Respiratory effort normal. Cardiovascular system: S1 & S2 heard, RRR.  Gastrointestinal system: Abdomen is distended, soft and nontender. Normal bowel sounds heard. Central nervous system: Alert and oriented. No focal neurological deficits. Musculoskeletal: No calf tenderness  Data Reviewed: I have personally reviewed following labs and imaging studies  CBC Lab Results  Component Value Date   WBC 9.7 10/31/2023   RBC 2.31 (L) 10/31/2023   HGB 7.7 (L) 10/31/2023   HCT 21.7 (L) 10/31/2023   MCV 93.9 10/31/2023   MCH 33.3 10/31/2023   PLT 134 (L) 10/31/2023   MCHC 35.5 10/31/2023   RDW 20.4 (H) 10/31/2023   LYMPHSABS 1.3 10/31/2023   MONOABS 1.3 (H) 10/31/2023   EOSABS 0.1 10/31/2023   BASOSABS 0.0 10/31/2023  Last metabolic panel Lab Results  Component Value Date   NA 121 (L) 10/31/2023   K 2.9 (L) 10/31/2023   CL 92 (L) 10/31/2023   CO2 19 (L) 10/31/2023   BUN 21 (H) 10/31/2023   CREATININE 0.65 10/31/2023   GLUCOSE 99 10/31/2023   GFRNONAA >60 10/31/2023   CALCIUM  8.6 (L) 10/31/2023   PHOS 2.6 10/17/2023   PROT 5.8 (L) 10/28/2023   ALBUMIN  2.6 (L) 10/28/2023   LABGLOB 4.4 07/30/2023   AGRATIO 1.2 03/28/2022   BILITOT 4.2 (H) 10/28/2023   ALKPHOS 65 10/28/2023   AST 77 (H) 10/28/2023   ALT 16 10/28/2023   ANIONGAP 10 10/31/2023    GFR: Estimated Creatinine Clearance: 63.6 mL/min (by C-G formula based on SCr of 0.65 mg/dL).  Recent Results (from the past 240 hours)  Body fluid culture w Gram Stain     Status: None   Collection Time: 10/22/23  2:56 PM   Specimen: Abdomen; Peritoneal Fluid  Result Value Ref Range Status   Specimen Description ABDOMEN  Final   Special Requests NONE  Final   Gram Stain NO WBC SEEN NO ORGANISMS SEEN   Final   Culture   Final    NO GROWTH 3 DAYS Performed at [facility], [address], [place], [name] [address]    Report Status 10/26/2023 FINAL  Final      Radiology Studies: No results found.    LOS: 28 days    Elgin Lam, MD [facility] 10/31/2023, 9:24 AM   If 7PM-7AM, please contact night-coverage [email or web address]

## 2023-11-01 DIAGNOSIS — E871 Hypo-osmolality and hyponatremia: Secondary | ICD-10-CM | POA: Diagnosis not present

## 2023-11-01 DIAGNOSIS — K7460 Unspecified cirrhosis of liver: Secondary | ICD-10-CM | POA: Diagnosis not present

## 2023-11-01 DIAGNOSIS — F1090 Alcohol use, unspecified, uncomplicated: Secondary | ICD-10-CM | POA: Diagnosis not present

## 2023-11-01 DIAGNOSIS — N179 Acute kidney failure, unspecified: Secondary | ICD-10-CM | POA: Diagnosis not present

## 2023-11-01 DIAGNOSIS — K7290 Hepatic failure, unspecified without coma: Secondary | ICD-10-CM | POA: Diagnosis not present

## 2023-11-01 LAB — MAGNESIUM: Magnesium: 1.7 mg/dL (ref 1.7–2.4)

## 2023-11-01 LAB — BASIC METABOLIC PANEL WITH GFR
Anion gap: 11 (ref 5–15)
BUN: 20 mg/dL (ref 6–20)
CO2: 19 mmol/L — ABNORMAL LOW (ref 22–32)
Calcium: 8.4 mg/dL — ABNORMAL LOW (ref 8.9–10.3)
Chloride: 94 mmol/L — ABNORMAL LOW (ref 98–111)
Creatinine, Ser: 0.46 mg/dL (ref 0.44–1.00)
GFR, Estimated: >60 mL/min (ref >60)
Glucose, Bld: 119 mg/dL — ABNORMAL HIGH (ref 70–99)
Potassium: 3.1 mmol/L — ABNORMAL LOW (ref 3.5–5.1)
Sodium: 124 mmol/L — ABNORMAL LOW (ref 135–145)

## 2023-11-01 LAB — GLUCOSE, CAPILLARY: Glucose-Capillary: 122 mg/dL — ABNORMAL HIGH (ref 70–99)

## 2023-11-01 LAB — PROTIME-INR
INR: 2.2 — ABNORMAL HIGH (ref 0.8–1.2)
Prothrombin Time: 25.4 s — ABNORMAL HIGH (ref 11.4–15.2)

## 2023-11-01 MED ORDER — POTASSIUM CHLORIDE 20 MEQ PO PACK
40.0000 meq | PACK | ORAL | Status: AC
  Administered 2023-11-01 (×2): 40 meq via ORAL
  Filled 2023-11-01 (×2): qty 2

## 2023-11-01 NOTE — Progress Notes (Signed)
 PT Cancellation Note  Patient Details Name: Joan Wood MRN: [id number] DOB: 1970-03-26   Cancelled Treatment:    Reason Eval/Treat Not Completed: Patient initially declined due to need for pericare and linen change. On second attempt, pt was just put on bed pan and requested acute PT to return later. Acute PT to re-attempt as schedule allows.   Kate ORN, PT, DPT Secure Chat Preferred  Rehab Office [phone]   [name] Wendolyn 11/01/2023, 3:20 PM

## 2023-11-01 NOTE — Plan of Care (Signed)
  Problem: Clinical Measurements: Goal: Will remain free from infection Outcome: Progressing   Problem: Nutrition: Goal: Adequate nutrition will be maintained Outcome: Progressing   Problem: Safety: Goal: Ability to remain free from injury will improve Outcome: Progressing   Problem: Skin Integrity: Goal: Risk for impaired skin integrity will decrease Outcome: Progressing   Problem: Health Behavior/Discharge Planning: Goal: Ability to manage health-related needs will improve Outcome: Not Progressing   Problem: Activity: Goal: Risk for activity intolerance will decrease Outcome: Not Progressing   Problem: Coping: Goal: Level of anxiety will decrease Outcome: Not Progressing

## 2023-11-01 NOTE — Progress Notes (Signed)
 PROGRESS NOTE    Joan Wood  FMW:[id number] DOB: [birth year] DOA: 10/03/2023 PCP: Edman Meade PEDLAR, FNP   Brief Narrative: Joan Wood is a 53 y.o. female with a history of decompensated liver cirrhosis, PUD, gastric ulcer perforation status post ex lap, GERD, pancreatitis, obstructive sleep apnea.  Patient presented secondary to nausea, coffee-ground emesis, bloody diarrhea with concern for upper GI bleed.  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  Hospitalization complicated by hepatic encephalopathy treated with lactulose and rifaximin.  Patient also requiring recurrent paracenteses for her recurrent ascites.  Discharge complicated by safe discharge concerns.   Assessment and Plan:  Decompensated alcoholic cirrhosis with ascites Patient treated empirically with Ceftriaxone  for possible SBP, although ascites fluid cell count not consistent with diagnosis. Palliative care consulted for ongoing goals of care discussions. -Continue to treat hepatic encephalopathy -Continue paracenteses as needed for symptoms  Hepatic encephalopathy Improved with lactulose and Rifaximin. Patient's insurance will not cover Rifaximin on discharge. -Continue lactulose -When plan for discharge developed, will trial off Rifaximin  Hypoalbuminemia Secondary to underlying liver disease.  Thrombocytopenia Secondary to underlying liver disease.   AKI Presumed secondary to ATN from contrast. Nephrology was consulted for management. Renal function improved. Patient is not an HD candidate.  Appendicitis General surgery consulted. Patient high-risk for surgical management with recommendation for conservative management. Patient completed antibiotic regimen.  Chronic hyponatremia Patient's baseline appears to be around 5-1 28.  Sodium is low as 121  this admission.  No mental status changes related to hyponatremia.   -Continue fluid restriction (decrease to 1500 mL)  Acute upper GI bleed Acute esophageal necrosis Acute blood loss anemia Patient seen and evaluated by gastroenterology. Upper GI endoscopy performed on 9/25 with evidence of grade D esophagitis in addition to concern for acute esophageal necrosis. Non-bleeding gastric ulcer noted with sutures. For esophageal necrosis, GI recommended carafate  for two weeks in addition to Protonix  BID and outpatient GI follow-up.  Abdominal pain Presumed secondary to abdominal distension from ascites. Symptoms resolved.  Lower extremity edema VTE ruled out. Likely related to hypoalbuminemia from liver disease.  Hypervolemic hyponatremia Sodium is stable.  Hypomagnesemia Recheck magnesium  in AM  Hypokalemia -Potassium supplementation -Recheck potassium in AM  Tobacco abuse -Continue nicotine  patch  Alcohol abuse Noted.  Severe malnutrition -Dietitian recommendations (10/21): Continue current diet as ordered Encourage PO intake Continue ordering assistance Continue Ensure Plus High Protein po TID, each supplement provides 350 kcal and 20 grams of protein. 1 packet Juven BID, each packet provides 95 calories, 2.5 grams of protein (collagen),  to support wound healing Continue MVI with minerals daily, thiamine  and folic acid  for alcohol use history  Pressure injury Mid sacrum. Unclear if present on admission.   DVT prophylaxis: SCDs Code Status:   Code Status: Full Code Family Communication: None at bedside. Patient confirmed it is okay for her son to be contact about her medical care today. Discussed with son via telephone (20 minutes) Disposition Plan: Discharge pending safe discharge plan   Consultants:  Palliative care medicine Gastroenterology  Procedures:    Antimicrobials:     Subjective: Some shoulder pain. Some abdominal discomfort. No other issues  overnight or this morning.  Objective: BP 92/61 (BP Location: Left Arm)   Pulse 100   Temp 98.6 F (37 C)   Resp 18   Ht 5' 2 (1.575 m)   Wt 58.4 kg  SpO2 100%   BMI 23.54 kg/m   Examination:  General exam: Appears calm and comfortable. Respiratory system: Clear to auscultation. Respiratory effort normal. Cardiovascular system: S1 & S2 heard, RRR.  Gastrointestinal system: Abdomen is distended, soft and mildly tender. Normal bowel sounds heard. Central nervous system: Alert and oriented. Psychiatry: Judgement and insight appear normal. Mood & affect appropriate.    Data Reviewed: I have personally reviewed following labs and imaging studies  CBC Lab Results  Component Value Date   WBC 9.7 10/31/2023   RBC 2.31 (L) 10/31/2023   HGB 7.7 (L) 10/31/2023   HCT 21.7 (L) 10/31/2023   MCV 93.9 10/31/2023   MCH 33.3 10/31/2023   PLT 134 (L) 10/31/2023   MCHC 35.5 10/31/2023   RDW 20.4 (H) 10/31/2023   LYMPHSABS 1.3 10/31/2023   MONOABS 1.3 (H) 10/31/2023   EOSABS 0.1 10/31/2023   BASOSABS 0.0 10/31/2023     Last metabolic panel Lab Results  Component Value Date   NA 124 (L) 11/01/2023   K 3.1 (L) 11/01/2023   CL 94 (L) 11/01/2023   CO2 19 (L) 11/01/2023   BUN 20 11/01/2023   CREATININE 0.46 11/01/2023   GLUCOSE 119 (H) 11/01/2023   GFRNONAA >60 11/01/2023   CALCIUM  8.4 (L) 11/01/2023   PHOS 2.6 10/17/2023   PROT 5.8 (L) 10/28/2023   ALBUMIN  2.6 (L) 10/28/2023   LABGLOB 4.4 07/30/2023   AGRATIO 1.2 03/28/2022   BILITOT 4.2 (H) 10/28/2023   ALKPHOS 65 10/28/2023   AST 77 (H) 10/28/2023   ALT 16 10/28/2023   ANIONGAP 11 11/01/2023    GFR: Estimated Creatinine Clearance: 63.6 mL/min (by C-G formula based on SCr of 0.46 mg/dL).  Recent Results (from the past 240 hours)  Body fluid culture w Gram Stain     Status: None   Collection Time: 10/22/23  2:56 PM   Specimen: Abdomen; Peritoneal Fluid  Result Value Ref Range Status   Specimen Description ABDOMEN   Final   Special Requests NONE  Final   Gram Stain NO WBC SEEN NO ORGANISMS SEEN   Final   Culture   Final    NO GROWTH 3 DAYS Performed at [facility], [address], [place], [name] [address]    Report Status 10/26/2023 FINAL  Final      Radiology Studies: IR Paracentesis Result Date: 10/31/2023 INDICATION: Patient with a history of cirrhosis and recurrent ascites. Interventional Radiology asked to perform a therapeutic paracentesis. EXAM: ULTRASOUND GUIDED PARACENTESIS MEDICATIONS: 1% lidocaine  10 mL COMPLICATIONS: None immediate. PROCEDURE: Informed written consent was obtained from the patient after a discussion of the risks, benefits and alternatives to treatment. A timeout was performed prior to the initiation of the procedure. Initial ultrasound scanning demonstrates a large amount of ascites within the right lower abdominal quadrant. The right lower abdomen was prepped and draped in the usual sterile fashion. 1% lidocaine  was used for local anesthesia. Following this, a 19 gauge, 7-cm, Yueh catheter was introduced. An ultrasound image was saved for documentation purposes. The paracentesis was performed. The catheter was removed and a dressing was applied. The patient tolerated the procedure well without immediate post procedural complication. FINDINGS: A total of approximately 4.7 L of clear yellow fluid was removed. IMPRESSION: Successful ultrasound-guided paracentesis yielding 4.7 liters of peritoneal fluid. Procedure performed by Warren Dais, NP PLAN: The patient has required >/=2 paracenteses in a 30 day period and a formal evaluation by the [facility] Inter[facility] Rad[facility] has been  arranged. Electronically Signed   By: [name].  Shick M.D.   On: 10/31/2023 13:22      LOS: 29 days    Elgin Lam, MD [facility] 11/01/2023, 1:58 PM   If 7PM-7AM, please contact night-coverage [email or web address]

## 2023-11-01 NOTE — Progress Notes (Signed)
 K 3.1, ok to give 40meq x2 per Dr. Briana.  Sergio Batch, PharmD, BCIDP, AAHIVP, CPP Infectious Disease Pharmacist 11/01/2023 12:24 PM

## 2023-11-01 NOTE — TOC Progression Note (Signed)
 Transition of Care [facility]) - Progression Note    Patient Details  Name: Joan Wood MRN: [id number] Date of Birth: 1970-05-31  Transition of Care [facility]) CM/SW Contact  Joan Wood, Joan Wood Phone Number: 11/01/2023, 10:03 AM  Clinical Narrative:   CSW met with patient to discuss disposition. CSW again attempted to encourage patient to agree to SNF, and patient is adamantly opposed, says she is only going back home and will not consider anything else. Patient says she is too independent and will do anything she can to get that independence back, but when CSW confronted her on rehab as getting her back to her independence patient still refused. Patient agreeable to hospital bed and 3N1 as long as Medicaid fully covers it. CSW contacted [employer], DME would be fully covered. Patient provided permission for CSW to speak with roommate, Joan Wood, about her returning home and accepting DME.  CSW spoke with Joan Wood about patient returning home. Joan Wood will not allow the patient to return to his house unless she can care for herself. Joan Wood said he has been telling the patient that and she isn't listening, she keeps thinking she's going back to his house and he did not sign up to care for her when he agreed to let her rent a room in his home; he cannot provide care for her. Joan Wood said he was on his way to the hospital and would be telling the patient again that she was not going to be going back to his home to see if she will understand. Per Joan Wood, he says that her children really need to be the ones to take care of her, but so far none of them are agreeable to take her and provide care.  CSW contacted MD to ask about capacity, and patient has capacity at this time. Patient is homeless at this time, refusing SNF, not appropriate for homeless shelter with her medical needs. CSW attempted to reach inpatient care management supervisor to discuss what else to do for patient, will continue to  follow.    Expected Discharge Plan:  (TBD) Barriers to Discharge: Homeless with medical needs, Inadequate or no insurance, Continued Medical Work up, Unsafe home situation               Expected Discharge Plan and Services In-house Referral: Clinical Social Work Discharge Planning Services: CM Consult Post Acute Care Choice: Home Health Living arrangements for the past 2 months: Single Family Home                 DME Arranged: Walker rolling   Date DME Agency Contacted: 10/08/23   Representative spoke with at DME Agency: Joan Wood             Social Drivers of Health (SDOH) Interventions SDOH Screenings   Food Insecurity: No Food Insecurity (10/03/2023)  Housing: Low Risk  (10/03/2023)  Transportation Needs: No Transportation Needs (10/03/2023)  Recent Concern: Transportation Needs - Unmet Transportation Needs (08/20/2023)  Utilities: Not At Risk (10/03/2023)  Depression (PHQ2-9): High Risk (07/30/2023)  Social Connections: Socially Isolated (10/03/2023)  Tobacco Use: High Risk (10/04/2023)    Readmission Risk Interventions    10/04/2023    9:02 AM 09/03/2023   12:31 PM 09/02/2023    9:01 AM  Readmission Risk Prevention Plan  Transportation Screening Complete Complete Complete  PCP or Specialist Appt within 3-5 Days   Complete  HRI or Home Care Consult   Complete  Social Work Consult for Recovery Care Planning/Counseling   Complete  Palliative Care Screening   Not Applicable  Medication Review (RN Care Manager) Complete Complete Complete  HRI or Home Care Consult Complete Complete   SW Recovery Care/Counseling Consult Complete Complete   Palliative Care Screening Not Applicable Not Applicable   Skilled Nursing Facility Not Applicable Patient Refused

## 2023-11-02 DIAGNOSIS — K7290 Hepatic failure, unspecified without coma: Secondary | ICD-10-CM | POA: Diagnosis not present

## 2023-11-02 DIAGNOSIS — K7460 Unspecified cirrhosis of liver: Secondary | ICD-10-CM | POA: Diagnosis not present

## 2023-11-02 DIAGNOSIS — E871 Hypo-osmolality and hyponatremia: Secondary | ICD-10-CM | POA: Diagnosis not present

## 2023-11-02 DIAGNOSIS — E43 Unspecified severe protein-calorie malnutrition: Secondary | ICD-10-CM | POA: Diagnosis not present

## 2023-11-02 DIAGNOSIS — F1090 Alcohol use, unspecified, uncomplicated: Secondary | ICD-10-CM | POA: Diagnosis not present

## 2023-11-02 DIAGNOSIS — D62 Acute posthemorrhagic anemia: Secondary | ICD-10-CM | POA: Diagnosis not present

## 2023-11-02 DIAGNOSIS — N179 Acute kidney failure, unspecified: Secondary | ICD-10-CM | POA: Diagnosis not present

## 2023-11-02 LAB — BASIC METABOLIC PANEL WITH GFR
Anion gap: 9 (ref 5–15)
Anion gap: 8 (ref 5–15)
BUN: 26 mg/dL — ABNORMAL HIGH (ref 6–20)
BUN: 26 mg/dL — ABNORMAL HIGH (ref 6–20)
CO2: 17 mmol/L — ABNORMAL LOW (ref 22–32)
CO2: 18 mmol/L — ABNORMAL LOW (ref 22–32)
Calcium: 8 mg/dL — ABNORMAL LOW (ref 8.9–10.3)
Calcium: 7.9 mg/dL — ABNORMAL LOW (ref 8.9–10.3)
Chloride: 93 mmol/L — ABNORMAL LOW (ref 98–111)
Chloride: 92 mmol/L — ABNORMAL LOW (ref 98–111)
Creatinine, Ser: 0.57 mg/dL (ref 0.44–1.00)
Creatinine, Ser: 0.63 mg/dL (ref 0.44–1.00)
GFR, Estimated: >60 mL/min (ref >60)
GFR, Estimated: >60 mL/min (ref >60)
Glucose, Bld: 114 mg/dL — ABNORMAL HIGH (ref 70–99)
Glucose, Bld: 93 mg/dL (ref 70–99)
Potassium: 5.2 mmol/L — ABNORMAL HIGH (ref 3.5–5.1)
Potassium: 4.7 mmol/L (ref 3.5–5.1)
Sodium: 119 mmol/L — CL (ref 135–145)
Sodium: 118 mmol/L — CL (ref 135–145)

## 2023-11-02 LAB — BLOOD GAS, VENOUS
Acid-base deficit: 3.2 mmol/L — ABNORMAL HIGH (ref 0.0–2.0)
Bicarbonate: 19.5 mmol/L — ABNORMAL LOW (ref 20.0–28.0)
O2 Saturation: 99.7 %
Patient temperature: 37.5
pCO2, Ven: 29 mmHg — ABNORMAL LOW (ref 44–60)
pH, Ven: 7.44 — ABNORMAL HIGH (ref 7.25–7.43)
pO2, Ven: 97 mmHg — ABNORMAL HIGH (ref 32–45)

## 2023-11-02 LAB — OSMOLALITY, URINE: Osmolality, Ur: 394 mosm/kg (ref 300–900)

## 2023-11-02 LAB — CBC
HCT: 19 % — ABNORMAL LOW (ref 36.0–46.0)
Hemoglobin: 6.9 g/dL — CL (ref 12.0–15.0)
MCH: 34.3 pg — ABNORMAL HIGH (ref 26.0–34.0)
MCHC: 36.3 g/dL — ABNORMAL HIGH (ref 30.0–36.0)
MCV: 94.5 fL (ref 80.0–100.0)
Platelets: 123 K/uL — ABNORMAL LOW (ref 150–400)
RBC: 2.01 MIL/uL — ABNORMAL LOW (ref 3.87–5.11)
RDW: 19.6 % — ABNORMAL HIGH (ref 11.5–15.5)
WBC: 10.5 K/uL (ref 4.0–10.5)
nRBC: 0 % (ref 0.0–0.2)

## 2023-11-02 LAB — SODIUM
Sodium: 122 mmol/L — ABNORMAL LOW (ref 135–145)
Sodium: 122 mmol/L — ABNORMAL LOW (ref 135–145)
Sodium: 124 mmol/L — ABNORMAL LOW (ref 135–145)
Sodium: 123 mmol/L — ABNORMAL LOW (ref 135–145)
Sodium: 121 mmol/L — ABNORMAL LOW (ref 135–145)

## 2023-11-02 LAB — PROTIME-INR
INR: 2.1 — ABNORMAL HIGH (ref 0.8–1.2)
Prothrombin Time: 24.7 s — ABNORMAL HIGH (ref 11.4–15.2)

## 2023-11-02 LAB — OSMOLALITY: Osmolality: 266 mosm/kg — ABNORMAL LOW (ref 275–295)

## 2023-11-02 LAB — LACTIC ACID, PLASMA: Lactic Acid, Venous: 0.8 mmol/L (ref 0.5–1.9)

## 2023-11-02 LAB — PREPARE RBC (CROSSMATCH)

## 2023-11-02 LAB — SODIUM, URINE, RANDOM: Sodium, Ur: <30 mmol/L

## 2023-11-02 MED ORDER — OCTREOTIDE ACETATE 100 MCG/ML IJ SOLN
100.0000 ug | Freq: Three times a day (TID) | INTRAMUSCULAR | Status: DC
  Administered 2023-11-02 – 2023-11-19 (×49): 100 ug via SUBCUTANEOUS
  Filled 2023-11-02 (×55): qty 1

## 2023-11-02 MED ORDER — FUROSEMIDE 10 MG/ML IJ SOLN
40.0000 mg | Freq: Once | INTRAMUSCULAR | Status: AC
  Administered 2023-11-02: 40 mg via INTRAVENOUS
  Filled 2023-11-02: qty 4

## 2023-11-02 MED ORDER — SODIUM CHLORIDE 0.9% IV SOLUTION: Freq: Once | INTRAVENOUS | Status: AC

## 2023-11-02 MED ORDER — MIDODRINE HCL 5 MG PO TABS
20.0000 mg | ORAL_TABLET | Freq: Three times a day (TID) | ORAL | Status: DC
  Administered 2023-11-02 – 2024-01-28 (×262): 20 mg via ORAL
  Filled 2023-11-02 (×246): qty 4

## 2023-11-02 MED ORDER — ALBUMIN HUMAN 25 % IV SOLN
12.5000 g | Freq: Once | INTRAVENOUS | Status: AC
  Administered 2023-11-02: 12.5 g via INTRAVENOUS
  Filled 2023-11-02: qty 50

## 2023-11-02 NOTE — Plan of Care (Signed)
  Problem: Clinical Measurements: Goal: Will remain free from infection Outcome: Progressing Goal: Respiratory complications will improve Outcome: Progressing Goal: Cardiovascular complication will be avoided Outcome: Progressing   Problem: Health Behavior/Discharge Planning: Goal: Ability to manage health-related needs will improve Outcome: Not Progressing   Problem: Clinical Measurements: Goal: Diagnostic test results will improve Outcome: Not Progressing   Problem: Activity: Goal: Risk for activity intolerance will decrease Outcome: Not Progressing

## 2023-11-02 NOTE — Progress Notes (Signed)
   11/02/23 0832  Vitals  Temp 100.2 F (37.9 C)  Temp Source Oral  BP (!) 84/43  MAP (mmHg) (!) 56  BP Location Left Arm  BP Method Automatic  Patient Position (if appropriate) Lying  Pulse Rate (!) 101  Pulse Rate Source Monitor  Resp 18  Level of Consciousness  Level of Consciousness Responds to Voice  Oxygen Therapy  SpO2 100 %  O2 Device Room Air    Patient noted more lethargic than usual, able to be arouse and answers questions appropriately,  declined her AM meds, verbalizing that she just needs her pain medication for her leg pain and rest. Please see VS above as b/p remains low.  NA+ 118. RN waiting for blood product. Urine sample sent. Attending MD aware of situation.

## 2023-11-02 NOTE — Progress Notes (Signed)
 Physical Therapy Treatment Patient Details Name: Joan Wood MRN: [id number] DOB: 22-Sep-1970 Today's Date: 11/02/2023   History of Present Illness 53 y.o. female presents to [facility] hospital on 10/03/2023 with nausea/vomiting and bloody diarrhea. Pt underwent upper EGD on 9/25, concerning for possible acute esophageal necrosis. Pt also underwent paracentesis on 9/25 and 10/6. PMH includes decompensated cirrhosis, PUD, GERD, pancreatitis, OSA.    PT Comments  The pt was agreeable to getting OOB to the chair this date. She was able to progress to ambulating up to ~10 ft today with a RW and CGA-minA for balance. She displays deficits in lower extremity strength that result in poor bil feet clearance and bil knee stability, placing her at risk for falls. Educated pt on the importance of mobilizing and trying to get OOB daily with staff to try to make functional progress. She verbalized understanding and agreed to try to sit up in the recliner x1 hour today. Will continue to follow acutely.   ACE wrap on bil lower legs (RN reports pt with severe pain using TED hose before so they are no longer present in room) with BP the following while receiving blood:  88/52 (64) supine 97/57 standing 103/69 sitting end of session    If plan is discharge home, recommend the following: A lot of help with walking and/or transfers;A lot of help with bathing/dressing/bathroom;Assistance with cooking/housework;Assist for transportation;Help with stairs or ramp for entrance;Direct supervision/assist for medications management;Direct supervision/assist for financial management;Supervision due to cognitive status   Can travel by private vehicle     Yes  Equipment Recommendations  Hospital bed;BSC/3in1;Rolling walker (2 wheels)    Recommendations for Other Services       Precautions / Restrictions Precautions Precautions: Fall Recall of Precautions/Restrictions: Impaired Precaution/Restrictions Comments:  orthostatic, foley, sensitive skin to touch Restrictions Weight Bearing Restrictions Per Provider Order: No     Mobility  Bed Mobility Overal bed mobility: Needs Assistance Bed Mobility: Supine to Sit     Supine to sit: Contact guard, HOB elevated, Used rails     General bed mobility comments: Extra time, CGA for safety    Transfers Overall transfer level: Needs assistance Equipment used: Rolling walker (2 wheels) Transfers: Sit to/from Stand, Bed to chair/wheelchair/BSC Sit to Stand: Contact guard assist   Step pivot transfers: Min assist       General transfer comment: Pt able to transfer to stand from EOB with extra time and CGA for safety. Pt required minA for balance and cuing to clear feet to step pivot to L from bed to chair    Ambulation/Gait Ambulation/Gait assistance: Min assist, Contact guard assist Gait Distance (Feet): 10 Feet Assistive device: Rolling walker (2 wheels) Gait Pattern/deviations: Step-to pattern, Decreased step length - right, Decreased step length - left, Decreased stride length, Decreased dorsiflexion - right, Decreased dorsiflexion - left, Knee flexed in stance - right, Knee flexed in stance - left, Trunk flexed Gait velocity: reduced Gait velocity interpretation: <1.31 ft/sec, indicative of household ambulator   General Gait Details: Pt takes slow, small, unsteady steps with poor feet clearance and noted instability in knees, but no buckling appreciated. CGA-minA needed for balance and safety, cuing pt to take higher steps and to manage her RW. Pt fatigued and became dizzy after ~10 ft and had to sit.   Stairs             Wheelchair Mobility     Tilt Bed    Modified Rankin (Stroke Patients Only)  Balance Overall balance assessment: Needs assistance Sitting-balance support: No upper extremity supported, Feet supported Sitting balance-Leahy Scale: Fair Sitting balance - Comments: supervision seated on EOB   Standing  balance support: Bilateral upper extremity supported, During functional activity, Reliant on assistive device for balance Standing balance-Leahy Scale: Poor Standing balance comment: reliant on RW and up to [employer]                            Communication Communication Communication: No apparent difficulties  Cognition Arousal: Alert Behavior During Therapy: WFL for tasks assessed/performed   PT - Cognitive impairments: No family/caregiver present to determine baseline, Awareness, Memory, Attention, Sequencing, Problem solving, Safety/Judgement, Initiation                       PT - Cognition Comments: Pt does not recall the TED hose, yet RN reports pt was screaming in pain and asking for them to be removed another day. Pt needed cues to sequence steps and RW to ambulate. Delayed process noted. Following commands: Impaired Following commands impaired: Follows multi-step commands inconsistently, Follows one step commands with increased time    Cueing Cueing Techniques: Verbal cues, Tactile cues  Exercises      General Comments General comments (skin integrity, edema, etc.): ACE wrap on bil lower legs (RN reports pt with severe pain using TED hose before so they are no longer present in room) with BP the following: 88/52 (64) supine, 97/57 standing, 103/69 sitting end of session      Pertinent Vitals/Pain Pain Assessment Pain Assessment: Faces Faces Pain Scale: Hurts little more Pain Location: sensitive skin to touch throughout Pain Descriptors / Indicators: Discomfort Pain Intervention(s): Limited activity within patient's tolerance, Monitored during session, Repositioned    Home Living                          Prior Function            PT Goals (current goals can now be found in the care plan section) Acute Rehab PT Goals Patient Stated Goal: to return to independence PT Goal Formulation: With patient Time For Goal Achievement:  11/16/23 Potential to Achieve Goals: Fair Progress towards PT goals: Progressing toward goals;Goals met and updated - see care plan    Frequency    Min 2X/week      PT Plan      Co-evaluation              AM-PAC PT 6 Clicks Mobility   Outcome Measure  Help needed turning from your back to your side while in a flat bed without using bedrails?: A Little Help needed moving from lying on your back to sitting on the side of a flat bed without using bedrails?: A Little Help needed moving to and from a bed to a chair (including a wheelchair)?: A Little Help needed standing up from a chair using your arms (e.g., wheelchair or bedside chair)?: A Little Help needed to walk in hospital room?: Total (<20 ft) Help needed climbing 3-5 steps with a railing? : Total 6 Click Score: 14    End of Session Equipment Utilized During Treatment: Gait belt Activity Tolerance: Patient tolerated treatment well;Patient limited by fatigue Patient left: with call bell/phone within reach;in chair (no chair alarm needed per RN) Nurse Communication: Mobility status (no chair alarm needed per RN) PT Visit Diagnosis: Other abnormalities of gait and  mobility (R26.89);Muscle weakness (generalized) (M62.81);Unsteadiness on feet (R26.81);Difficulty in walking, not elsewhere classified (R26.2)     Time: 8845-8778 PT Time Calculation (min) (ACUTE ONLY): 27 min  Charges:    $Gait Training: 8-22 mins $Therapeutic Activity: 8-22 mins PT General Charges $$ ACUTE PT VISIT: 1 Visit                     Theo Ferretti, PT, DPT Acute Rehabilitation Services  Office: [phone]    Theo [name] Ferretti 11/02/2023, 3:17 PM

## 2023-11-02 NOTE — Progress Notes (Addendum)
 Patient noted more alert throughout shift and taking all her prescribed medication. She was able to get OOB into recliner for approx. 1 hour and had one large BM. Paracentesis site's ABD seems saturated and new ABD/hyperfix taped applied. Patient expressed gratitude for MD care today. She is eating dinner at this time. No other distress or concerns voiced.

## 2023-11-02 NOTE — Progress Notes (Signed)
 CCMD and attending MD at bedsisde. Patient noted more alert and awake asking for pain medication. Patient agreed to take her AM medications. She verbalized that she will get OOB later with RN today.

## 2023-11-02 NOTE — Progress Notes (Signed)
 Repeat Na just resulted. Up to 122 s/p PRBC and lasix, improved. Plan is to stay the course and continue to monitor q4h (need to ensure that this is done on time for next check). If trending down then would recommend considering ICU for 3%.  [name], MD [facility]

## 2023-11-02 NOTE — Progress Notes (Addendum)
 PROGRESS NOTE    DONEEN OLLINGER  FMW:[id number] DOB: [birth year] DOA: 10/03/2023 PCP: Edman Meade PEDLAR, FNP   Brief Narrative: Joan Wood is a 53 y.o. female with a history of decompensated liver cirrhosis, PUD, gastric ulcer perforation status post ex lap, GERD, pancreatitis, obstructive sleep apnea.  Patient presented secondary to nausea, coffee-ground emesis, bloody diarrhea with concern for upper GI bleed.  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  Hospitalization complicated by hepatic encephalopathy treated with lactulose and rifaximin.  Patient also requiring recurrent paracenteses for her recurrent ascites.  Discharge complicated by safe discharge concerns.   Assessment and Plan:  Decompensated alcoholic cirrhosis with ascites Patient treated empirically with Ceftriaxone  for possible SBP, although ascites fluid cell count not consistent with diagnosis. Palliative care consulted for ongoing goals of care discussions. -Continue to treat hepatic encephalopathy -Continue paracenteses as needed for symptoms  Hepatic encephalopathy Improved with lactulose and Rifaximin. Patient's insurance will not cover Rifaximin on discharge. -Continue lactulose -When plan for discharge developed, will trial off Rifaximin  Transient alteration of awareness Patient developed significant change in mental status and was difficult to arose. PCCM was consulted for concern that patient would continue to decompensate. Patient spontaneously improved about 30 minutes later. Unclear etiology. She does not appear to have any post-ictal type presentation. She had an elevated temperature; no leukocytosis. Also complicated by patient's hyponatremia. -Check blood cultures -Treat anemia -VBG  Hypoalbuminemia Secondary to underlying liver  disease.  Thrombocytopenia Secondary to underlying liver disease.   AKI Presumed secondary to ATN from contrast. Nephrology was consulted for management. Renal function improved. Patient is not an HD candidate.  Appendicitis General surgery consulted. Patient high-risk for surgical management with recommendation for conservative management. Patient completed antibiotic regimen.  Chronic hypotension Worse this morning. Associated drop in hemoglobin, which may be etiology. -Continue midodrine  15 mg TID -Albumin  x1  Chronic hyponatremia Patient's baseline appears to be around 125 -128.  Sodium has significantly worsened, down to 118 today. Held lasix secondary to worsened hypotension. -Continue fluid restriction (1,500 mL); may need to increase restriction further -Nephrology consult  Acute upper GI bleed Acute esophageal necrosis Acute blood loss anemia Patient seen and evaluated by gastroenterology. Upper GI endoscopy performed on 9/25 with evidence of grade D esophagitis in addition to concern for acute esophageal necrosis. Non-bleeding gastric ulcer noted with sutures. For esophageal necrosis, GI recommended carafate  for two weeks in addition to Protonix  BID and outpatient GI follow-up. Patient with recurrent anemia on 10/24 without obvious hemorrhaging noted; hemoglobin down to 6.9. 1 unit of PRBC ordered on 10/22. -Continue blood transfusion -Follow-up post-transfusion hemoglobin  Abdominal pain Presumed secondary to abdominal distension from ascites. Symptoms resolved.  Lower extremity edema VTE ruled out. Likely related to hypoalbuminemia from liver disease.  Hypervolemic hyponatremia Sodium is stable.  Hypomagnesemia Improved with supplementation  Hypokalemia Resolved with potassium supplementation.  Tobacco abuse -Continue nicotine  patch  Alcohol abuse Noted.  Severe malnutrition -Dietitian recommendations (10/21): Continue current diet as ordered Encourage  PO intake Continue ordering assistance Continue Ensure Plus High Protein po TID, each supplement provides 350 kcal and 20 grams of protein. 1 packet Juven BID, each packet provides 95 calories, 2.5 grams of protein (collagen),  to support wound healing Continue MVI with minerals daily, thiamine  and folic acid  for alcohol use history  Pressure injury Mid sacrum. Unclear if present on admission.  DVT prophylaxis: SCDs Code Status:   Code Status: Full Code Family Communication: None at bedside. Spoke to son on telephone Disposition Plan: Discharge pending safe discharge plan   Consultants:  Palliative care medicine Gastroenterology  Procedures:    Antimicrobials:     Subjective: Initial conversation with patient went well with some complaints of shoulder pain. Patient reevaluated about 30 minutes later and was not answering questions appropriate and difficult to arouse.  Objective: BP (!) 84/43 (BP Location: Left Arm)   Pulse (!) 101   Temp 100.2 F (37.9 C) (Oral)   Resp 18   Ht 5' 2 (1.575 m)   Wt 52.3 kg   SpO2 100%   BMI 21.07 kg/m   Examination:  General exam: Appears calm and comfortable. Chronically ill appearing. Respiratory system: Clear to auscultation. Respiratory effort normal. Cardiovascular system: S1 & S2 heard, RRR.  Gastrointestinal system: Abdomen is distended, soft and mildly tender. Normal bowel sounds heard. Central nervous system: Alert and oriented. Asterixis is present   Data Reviewed: I have personally reviewed following labs and imaging studies  CBC Lab Results  Component Value Date   WBC 10.5 11/02/2023   RBC 2.01 (L) 11/02/2023   HGB 6.9 (LL) 11/02/2023   HCT 19.0 (L) 11/02/2023   MCV 94.5 11/02/2023   MCH 34.3 (H) 11/02/2023   PLT 123 (L) 11/02/2023   MCHC 36.3 (H) 11/02/2023   RDW 19.6 (H) 11/02/2023   LYMPHSABS 1.3 10/31/2023   MONOABS 1.3 (H) 10/31/2023   EOSABS 0.1 10/31/2023   BASOSABS 0.0 10/31/2023     Last  metabolic panel Lab Results  Component Value Date   NA 118 (LL) 11/02/2023   K 4.7 11/02/2023   CL 92 (L) 11/02/2023   CO2 18 (L) 11/02/2023   BUN 26 (H) 11/02/2023   CREATININE 0.63 11/02/2023   GLUCOSE 93 11/02/2023   GFRNONAA >60 11/02/2023   CALCIUM  7.9 (L) 11/02/2023   PHOS 2.6 10/17/2023   PROT 5.8 (L) 10/28/2023   ALBUMIN  2.6 (L) 10/28/2023   LABGLOB 4.4 07/30/2023   AGRATIO 1.2 03/28/2022   BILITOT 4.2 (H) 10/28/2023   ALKPHOS 65 10/28/2023   AST 77 (H) 10/28/2023   ALT 16 10/28/2023   ANIONGAP 8 11/02/2023    GFR: Estimated Creatinine Clearance: 63.6 mL/min (by C-G formula based on SCr of 0.63 mg/dL).  No results found for this or any previous visit (from the past 240 hours).     Radiology Studies: No results found.     LOS: 30 days    Elgin Lam, MD [facility] 11/02/2023, 9:35 AM   If 7PM-7AM, please contact night-coverage [email or web address]

## 2023-11-02 NOTE — Progress Notes (Addendum)
 [facility] Re-Consult Note    Assessment/ Plan:   Joan Wood is an 53 y.o. 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 Reconsulted 10/24 for worsening hyponatremia (Na down to 118)  Severe hyponatremia:  In the setting of decompensated cirrhosis, hypervolemia. -acute on chronic -poor prognostic indicator -volume status currently: hypervolemic. It's unclear if her symptoms earlier this AM were related to hyponatremia or hepatic encephalopathy, she looks like she's back to her usual state of health at the time of my encounter -una <30, uosm 394 -lasix (last dose 10/19): will give a dose of 40mg  IV x 1 dose now -check serial Na every 4 hours, avoid overcorrection -if Na is downtrending, then will need 3% -not a candidate for urea or tolvaptan -fluid restrict: decreased to 1.2L/day  Hypotension, chronic -increasing midodrine  to 20mg  TID -starting octreotide    AKI:  Baseline Cr 0.4, rose to 0.6 on 10/4, 1.1 10/5, 1.4 10/6.  She had CT w contrast 10/2. BP chronically low 80/50s -90/50s already on midodrine .  UA last 10/1 wasn't a clean catch - repeat 10/6 - looks bland.   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 Will cont to monitor -No current indications for RRT - poor candidate for dialysis should need arise, would not recommend it given high mortality risk with dialysis and cirrhosis   Decompensated Cirrhosis:  alcohol in etiology.  Ok for moderate volume paracentesis PRN + IV albumin  25g/L on cue to paracentesis.    Anemia:  ABLA in the setting of recent ulcers, severe esophagitis. Hb 6.9, receiving 1u PRBC   Appendicitis:  surgery recommended abx, high risk for surgery and no plans to proceed to OR.  If need for reimaging  arises please avoid contrast. Per primary  Poor prognosis per charting, palliative care following.  Joan Stank, MD [facility]  Subjective:   Reconsult for worsening hyponatremia. Patient seen and examined bedside. She reports that she did not eat at all yesterday and had a very poor appetite. She is now hungry. She reports that this AM, she was completely out of it. Was able to hear everyone but no respond. Felt very lethargic. She reports that she is back to her usual self now. She reports that this is the 3rd time this has happened since being here. She also reports that she feels like it's time for another paracentesis. She currently denies any chest pain, SOB, swelling in legs, dizziness,blurry vision, parasthesias, headaches. Receiving 1U PRBC currently    Objective:   BP (!) 93/53   Pulse 95   Temp 97.9 F (36.6 C) Comment: pt just had ice chip this is axillary temp  Resp 20   Ht 5' 2 (1.575 m)   Wt 52.3 kg   SpO2 100%   BMI 21.07 kg/m   Intake/Output Summary (Last 24 hours) at 11/02/2023 1059 Last data filed at 11/02/2023 1000 Gross per 24 hour  Intake 480 ml  Output 500 ml  Net -20 ml   Weight change: -6.124 kg  Physical Exam: Gen: chronically ill appearing, NAD HEENT: incteric sclera CVS: RRR Resp: CTA B/L, no w/r/r/c, unlabored, normal wob, speaking in full sentences Abd: soft, distended with +fluid shift, RLQ tenderness on palpation Ext: no edema b/l Les Neuro: awake, alert, following commands GU: +foley  Imaging: No results found.  Labs: BMET Recent Labs  Lab 10/27/23 0603 10/28/23 0645 10/29/23 1014 10/31/23 0451 10/31/23 2135 11/01/23 0130 11/02/23 0119 11/02/23 0600  NA 123* 121* 124* 121*  --  124* 119* 118*  K 4.6 3.5 3.5 2.9* 2.8* 3.1* 5.2* 4.7  CL 91* 92* 93* 92*  --  94* 93* 92*  CO2 21* 18* 21* 19*  --  19* 17* 18*  GLUCOSE 101* 108* 117* 99  --  119* 114* 93  BUN 18 19 18  21*  --  20 26* 26*  CREATININE 0.56  0.49 0.54 0.65  --  0.46 0.57 0.63  CALCIUM  8.9 8.3* 8.8* 8.6*  --  8.4* 8.0* 7.9*   CBC Recent Labs  Lab 10/27/23 0603 10/28/23 0645 10/31/23 0451 11/02/23 0600  WBC 11.0* 10.8* 9.7 10.5  NEUTROABS 8.1* 8.5* 6.9  --   HGB 8.0* 7.4* 7.7* 6.9*  HCT 22.6* 21.3* 21.7* 19.0*  MCV 94.6 97.3 93.9 94.5  PLT 168 144* 134* 123*    Medications:     calcium  carbonate  1 tablet Oral TID WC   Chlorhexidine  Gluconate Cloth  6 each Topical Daily   feeding supplement  237 mL Oral TID BM   folic acid   1 mg Oral Daily   gabapentin   300 mg Oral TID   lactulose  30 g Oral TID   lidocaine   2 patch Transdermal Q24H   midodrine   15 mg Oral Q8H   multivitamin with minerals  1 tablet Oral Daily   nicotine   21 mg Transdermal Daily   nutrition supplement (JUVEN)  1 packet Oral BID BM   pantoprazole   40 mg Oral BID   rifaximin  550 mg Oral BID   sucralfate   1 g Oral Q6H   thiamine   100 mg Oral Daily      Joan Stank, MD [facility] 11/02/2023, 10:59 AM

## 2023-11-02 NOTE — TOC Progression Note (Signed)
 Transition of Care [facility]) - Progression Note    Patient Details  Name: Joan Wood MRN: [id number] Date of Birth: 01-Feb-1970  Transition of Care [facility] CM/SW Contact  [name], [name] Phone Number: 11/02/2023, 11:25 AM  Clinical Narrative:   CSW updated by MD that patient provided permission to speak with son, Dorn. CSW contacted Dorn to discuss disposition and answer questions. Family in agreement that the patient will need placement, and CSW explained barrier in that the patient has capacity and has been refusing. Dorn said he had spoken with the patient about it when he visited yesterday and she seemed receptive to him, but he wasn't sure if she was just saying that to him to avoid an argument or if she was truly in agreement. CSW discussed plan of scheduling a family meeting with patient, children, and palliative care to discuss goals and disposition. Dorn in agreement, and suggested contacting Megan first to schedule as she has more responsibilities; Dorn could attend whenever Duwaine is available. Dorn also said they are updating patient's sister, Mliss, but that the patient may get irritated if she's included in the meeting; she doesn't always get along with her.   Information relayed and discussed with palliative NP and MD. CSW also discussed barriers with supervisor. CSW to follow.    Expected Discharge Plan:  (TBD) Barriers to Discharge: Homeless with medical needs, Inadequate or no insurance, Continued Medical Work up, Unsafe home situation               Expected Discharge Plan and Services In-house Referral: Clinical Social Work Discharge Planning Services: CM Consult Post Acute Care Choice: Home Health Living arrangements for the past 2 months: Single Family Home                 DME Arranged: Walker rolling   Date DME Agency Contacted: 10/08/23   Representative spoke with at DME Agency: [name]             Social Drivers of  Health (SDOH) Interventions SDOH Screenings   Food Insecurity: No Food Insecurity (10/03/2023)  Housing: Low Risk  (10/03/2023)  Transportation Needs: No Transportation Needs (10/03/2023)  Recent Concern: Transportation Needs - Unmet Transportation Needs (08/20/2023)  Utilities: Not At Risk (10/03/2023)  Depression (PHQ2-9): High Risk (07/30/2023)  Social Connections: Socially Isolated (10/03/2023)  Tobacco Use: High Risk (10/04/2023)    Readmission Risk Interventions    10/04/2023    9:02 AM 09/03/2023   12:31 PM 09/02/2023    9:01 AM  Readmission Risk Prevention Plan  Transportation Screening Complete Complete Complete  PCP or Specialist Appt within 3-5 Days   Complete  HRI or Home Care Consult   Complete  Social Work Consult for Recovery Care Planning/Counseling   Complete  Palliative Care Screening   Not Applicable  Medication Review Oceanographer) Complete Complete Complete  HRI or Home Care Consult Complete Complete   SW Recovery Care/Counseling Consult Complete Complete   Palliative Care Screening Not Applicable Not Applicable   Skilled Nursing Facility Not Applicable Patient Refused

## 2023-11-03 DIAGNOSIS — E871 Hypo-osmolality and hyponatremia: Secondary | ICD-10-CM | POA: Diagnosis not present

## 2023-11-03 DIAGNOSIS — K7460 Unspecified cirrhosis of liver: Secondary | ICD-10-CM | POA: Diagnosis not present

## 2023-11-03 DIAGNOSIS — K7290 Hepatic failure, unspecified without coma: Secondary | ICD-10-CM | POA: Diagnosis not present

## 2023-11-03 DIAGNOSIS — E43 Unspecified severe protein-calorie malnutrition: Secondary | ICD-10-CM | POA: Diagnosis not present

## 2023-11-03 DIAGNOSIS — N179 Acute kidney failure, unspecified: Secondary | ICD-10-CM | POA: Diagnosis not present

## 2023-11-03 DIAGNOSIS — F1090 Alcohol use, unspecified, uncomplicated: Secondary | ICD-10-CM | POA: Diagnosis not present

## 2023-11-03 DIAGNOSIS — D62 Acute posthemorrhagic anemia: Secondary | ICD-10-CM | POA: Diagnosis not present

## 2023-11-03 LAB — TYPE AND SCREEN
ABO/RH(D): A POS
Antibody Screen: NEGATIVE
Unit division: 0

## 2023-11-03 LAB — COMPREHENSIVE METABOLIC PANEL WITH GFR
ALT: 28 U/L (ref 0–44)
AST: 113 U/L — ABNORMAL HIGH (ref 15–41)
Albumin: 2.6 g/dL — ABNORMAL LOW (ref 3.5–5.0)
Alkaline Phosphatase: 59 U/L (ref 38–126)
Anion gap: 9 (ref 5–15)
BUN: 22 mg/dL — ABNORMAL HIGH (ref 6–20)
CO2: 18 mmol/L — ABNORMAL LOW (ref 22–32)
Calcium: 8.1 mg/dL — ABNORMAL LOW (ref 8.9–10.3)
Chloride: 91 mmol/L — ABNORMAL LOW (ref 98–111)
Creatinine, Ser: 0.55 mg/dL (ref 0.44–1.00)
GFR, Estimated: >60 mL/min (ref >60)
Glucose, Bld: 112 mg/dL — ABNORMAL HIGH (ref 70–99)
Potassium: 4 mmol/L (ref 3.5–5.1)
Sodium: 118 mmol/L — CL (ref 135–145)
Total Bilirubin: 5 mg/dL — ABNORMAL HIGH (ref 0.0–1.2)
Total Protein: 6.3 g/dL — ABNORMAL LOW (ref 6.5–8.1)

## 2023-11-03 LAB — BPAM RBC
Blood Product Expiration Date: 202511132359
ISSUE DATE / TIME: 202510240935
Unit Type and Rh: 6200

## 2023-11-03 LAB — HEMOGLOBIN AND HEMATOCRIT, BLOOD
HCT: 22.4 % — ABNORMAL LOW (ref 36.0–46.0)
Hemoglobin: 8 g/dL — ABNORMAL LOW (ref 12.0–15.0)

## 2023-11-03 LAB — PROTIME-INR
INR: 1.9 — ABNORMAL HIGH (ref 0.8–1.2)
Prothrombin Time: 22.9 s — ABNORMAL HIGH (ref 11.4–15.2)

## 2023-11-03 LAB — SODIUM
Sodium: 120 mmol/L — ABNORMAL LOW (ref 135–145)
Sodium: 122 mmol/L — ABNORMAL LOW (ref 135–145)
Sodium: 122 mmol/L — ABNORMAL LOW (ref 135–145)
Sodium: 123 mmol/L — ABNORMAL LOW (ref 135–145)
Sodium: 123 mmol/L — ABNORMAL LOW (ref 135–145)

## 2023-11-03 MED ORDER — FUROSEMIDE 10 MG/ML IJ SOLN
40.0000 mg | Freq: Once | INTRAMUSCULAR | Status: AC
  Administered 2023-11-03: 40 mg via INTRAVENOUS
  Filled 2023-11-03: qty 4

## 2023-11-03 MED ORDER — FUROSEMIDE 10 MG/ML IJ SOLN
20.0000 mg | Freq: Once | INTRAMUSCULAR | Status: AC
  Administered 2023-11-03: 20 mg via INTRAVENOUS
  Filled 2023-11-03: qty 4

## 2023-11-03 NOTE — Plan of Care (Signed)

## 2023-11-03 NOTE — Progress Notes (Signed)
 PROGRESS NOTE    Joan Wood  FMW:[id number] DOB: 1970-06-21 DOA: 10/03/2023 PCP: Edman Meade PEDLAR, FNP   Brief Narrative: Joan Wood is a 53 y.o. female with a history of decompensated liver cirrhosis, PUD, gastric ulcer perforation status post ex lap, GERD, pancreatitis, obstructive sleep apnea.  Patient presented secondary to nausea, coffee-ground emesis, bloody diarrhea with concern for upper GI bleed.  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  Hospitalization complicated by hepatic encephalopathy treated with lactulose and rifaximin.  Patient also requiring recurrent paracenteses for her recurrent ascites.  Discharge complicated by safe discharge concerns.   Assessment and Plan:  Decompensated alcoholic cirrhosis with ascites Patient treated empirically with Ceftriaxone  for possible SBP, although ascites fluid cell count not consistent with diagnosis. Palliative care consulted for ongoing goals of care discussions. -Continue to treat hepatic encephalopathy -Continue paracenteses as needed for symptoms  Hepatic encephalopathy Improved with lactulose and Rifaximin. Patient's insurance will not cover Rifaximin on discharge. -Continue lactulose -When plan for discharge developed, will trial off Rifaximin  Transient alteration of awareness Patient developed significant change in mental status and was difficult to arose. PCCM was consulted for concern that patient would continue to decompensate. Patient spontaneously improved about 30 minutes later. Unclear etiology. She does not appear to have any post-ictal type presentation. She had an elevated temperature; no leukocytosis. Also complicated by patient's hyponatremia. Blood cultures with no growth to date.  Hypoalbuminemia Secondary to underlying liver  disease.  Thrombocytopenia Secondary to underlying liver disease.   AKI Presumed secondary to ATN from contrast. Nephrology was consulted for management. Renal function improved. Patient is not an HD candidate.  Appendicitis General surgery consulted. Patient high-risk for surgical management with recommendation for conservative management. Patient completed antibiotic regimen.  Chronic hypotension Worse this morning. Associated drop in hemoglobin, which may be etiology. Patient has received albumin  IV intermittently. -Continue midodrine  15 mg TID  Chronic hyponatremia Patient's baseline appears to be around 125 -128.  Sodium has significantly worsened. Nephrology consulted on 10/24. Lasix started with initial improvement, however sodium is back down to 118 today. -Continue fluid restriction (1,200 mL) -Nephrology recommendations: Lasix IV x1  Acute upper GI bleed Acute esophageal necrosis Acute blood loss anemia Patient seen and evaluated by gastroenterology. Upper GI endoscopy performed on 9/25 with evidence of grade D esophagitis in addition to concern for acute esophageal necrosis. Non-bleeding gastric ulcer noted with sutures. For esophageal necrosis, GI recommended carafate  for two weeks in addition to Protonix  BID and outpatient GI follow-up. Patient with recurrent anemia on 10/24 without obvious hemorrhaging noted; hemoglobin down to 6.9. Patient has received a total of 2 units of PRBC. Hemoglobin up to 8.0. -CBC in AM  Abdominal pain Presumed secondary to abdominal distension from ascites. Symptoms resolved.  Lower extremity edema VTE ruled out. Likely related to hypoalbuminemia from liver disease.  Hypervolemic hyponatremia Sodium is stable.  Hypomagnesemia Improved with supplementation  Hypokalemia Resolved with potassium supplementation.  Tobacco abuse -Continue nicotine  patch  Alcohol abuse Noted.  Severe malnutrition -Dietitian recommendations  (10/21): Continue current diet as ordered Encourage PO intake Continue ordering assistance Continue Ensure Plus High Protein po TID, each supplement provides 350 kcal and 20 grams of protein. 1 packet Juven BID, each packet provides 95 calories, 2.5 grams of protein (collagen),  to support wound healing Continue MVI with minerals daily, thiamine  and folic acid  for alcohol use  history  Pressure injury Mid sacrum. Unclear if present on admission.   DVT prophylaxis: SCDs Code Status:   Code Status: Full Code Family Communication: None at bedside. Spoke to son on telephone Disposition Plan: Discharge pending safe discharge plan   Consultants:  Palliative care medicine Gastroenterology  Procedures:    Antimicrobials:     Subjective: No issues this morning. Doing better than yesterday.  Objective: BP 96/60 (BP Location: Right Arm)   Pulse 82   Temp 97.9 F (36.6 C) (Oral)   Resp 16   Ht 5' 2 (1.575 m)   Wt 55.7 kg   SpO2 100%   BMI 22.46 kg/m   Examination:  General exam: Appears calm and comfortable. Respiratory system: Clear to auscultation. Respiratory effort normal. Cardiovascular system: S1 & S2 heard, RRR. No murmur. Gastrointestinal system: Abdomen is distended, soft and nontender. Normal bowel sounds heard. Central nervous system: Alert and oriented. No focal neurological deficits. Musculoskeletal: No edema. No calf tenderness Psychiatry: Judgement and insight appear normal. Mood & affect appropriate.    Data Reviewed: I have personally reviewed following labs and imaging studies  CBC Lab Results  Component Value Date   WBC 10.5 11/02/2023   RBC 2.01 (L) 11/02/2023   HGB 8.0 (L) 11/03/2023   HCT 22.4 (L) 11/03/2023   MCV 94.5 11/02/2023   MCH 34.3 (H) 11/02/2023   PLT 123 (L) 11/02/2023   MCHC 36.3 (H) 11/02/2023   RDW 19.6 (H) 11/02/2023   LYMPHSABS 1.3 10/31/2023   MONOABS 1.3 (H) 10/31/2023   EOSABS 0.1 10/31/2023   BASOSABS 0.0 10/31/2023      Last metabolic panel Lab Results  Component Value Date   NA 122 (L) 11/03/2023   K 4.0 11/03/2023   CL 91 (L) 11/03/2023   CO2 18 (L) 11/03/2023   BUN 22 (H) 11/03/2023   CREATININE 0.55 11/03/2023   GLUCOSE 112 (H) 11/03/2023   GFRNONAA >60 11/03/2023   CALCIUM  8.1 (L) 11/03/2023   PHOS 2.6 10/17/2023   PROT 6.3 (L) 11/03/2023   ALBUMIN  2.6 (L) 11/03/2023   LABGLOB 4.4 07/30/2023   AGRATIO 1.2 03/28/2022   BILITOT 5.0 (H) 11/03/2023   ALKPHOS 59 11/03/2023   AST 113 (H) 11/03/2023   ALT 28 11/03/2023   ANIONGAP 9 11/03/2023    GFR: Estimated Creatinine Clearance: 63.6 mL/min (by C-G formula based on SCr of 0.55 mg/dL).  Recent Results (from the past 240 hours)  Culture, blood (Routine X 2) w Reflex to ID Panel     Status: None (Preliminary result)   Collection Time: 11/02/23  5:00 PM   Specimen: BLOOD  Result Value Ref Range Status   Specimen Description BLOOD BLOOD RIGHT ARM  Final   Special Requests   Final    BOTTLES DRAWN AEROBIC AND ANAEROBIC Blood Culture results may not be optimal due to an inadequate volume of blood received in culture bottles   Culture   Final    NO GROWTH < 24 HOURS Performed at [facility], [address] [place], [name] [address]    Report Status PENDING  Incomplete       Radiology Studies: No results found.     LOS: 31 days    Elgin Lam, MD [facility] 11/03/2023, 12:54 PM   If 7PM-7AM, please contact night-coverage [email or web address]

## 2023-11-03 NOTE — Progress Notes (Signed)
 [facility] Progress Note    Assessment/ Plan:   Joan Wood is an 53 y.o. 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 Reconsulted 10/24 for worsening hyponatremia (Na down to 118)  Severe hyponatremia:  In the setting of decompensated cirrhosis, hypervolemia. -acute on chronic. Asymptomatic currently -volume status currently: hypervolemic (abd distension) -poor prognostic indicator -una <30, uosm 394 -fluid restrict: decreased to 1.2L/day, reinforced this with patient today -lasix (last dose 10/19): Na improved s/p lasix 40mg  IV x 1 10/24, redose today -check serial Na every 4 hours, avoid overcorrection -if Na is downtrending, then will need 3% -not a candidate for urea or tolvaptan  Hypotension, chronic -increased midodrine  to 20mg  TID 10/24- continue -started octreotide  10/24-continue -BP low but stable   AKI:  Baseline Cr 0.4, rose to 0.6 on 10/4, 1.1 10/5, 1.4 10/6.  She had CT w contrast 10/2. BP chronically low 80/50s -90/50s already on midodrine .  UA last 10/1 wasn't a clean catch - repeat 10/6 - looks bland.   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 Will cont to monitor. Labs pending -No current indications for RRT - poor candidate for dialysis should need arise, would not recommend it given high mortality risk with dialysis and cirrhosis   Decompensated Cirrhosis:  alcohol in etiology.  Ok for moderate volume paracentesis PRN + IV albumin  25g/L on cue to paracentesis.    Anemia:  ABLA in the setting of recent ulcers, severe esophagitis. Hgb improved to 8.0 post transfusion 10/24   Appendicitis:  surgery recommended abx, high risk for surgery and no plans to proceed to OR.  If need for reimaging arises please try to  avoid contrast. Per primary  Poor prognosis per charting, palliative care following.  [name], MD [facility]  Subjective:   Patient seen and examined bedside. Na did increase to 124, last was 120. She denies any chest pain, SOB, dizziness, blurry vision, HA's, parasthesias CMP pending.   Objective:   BP (!) 96/54 (BP Location: Left Arm)   Pulse 99   Temp 98.6 F (37 C)   Resp 18   Ht 5' 2 (1.575 m)   Wt 55.7 kg   SpO2 98%   BMI 22.46 kg/m   Intake/Output Summary (Last 24 hours) at 11/03/2023 0920 Last data filed at 11/03/2023 0600 Gross per 24 hour  Intake 2165 ml  Output 1950 ml  Net 215 ml   Weight change: 3.447 kg  Physical Exam: Gen: chronically ill appearing, NAD HEENT: incteric sclera CVS: RRR Resp: CTA B/L, no w/r/r/c, unlabored, normal wob, speaking in full sentences Abd: soft, distended with +fluid shift, RLQ tenderness on palpation Ext: no edema b/l Les Neuro: awake, alert, following commands GU: +foley  Imaging: No results found.  Labs: BMET Recent Labs  Lab 10/28/23 0645 10/29/23 1014 10/31/23 0451 10/31/23 2135 11/01/23 0130 11/02/23 0119 11/02/23 0600 11/02/23 1520 11/02/23 1538 11/02/23 1700 11/02/23 1902 11/02/23 2253 11/03/23 0451  NA 121* 124* 121*  --  124* 119* 118* 122* 122* 124* 123* 121* 120*  K 3.5 3.5 2.9* 2.8* 3.1* 5.2* 4.7  --   --   --   --   --   --   CL 92* 93* 92*  --  94* 93* 92*  --   --   --   --   --   --  CO2 18* 21* 19*  --  19* 17* 18*  --   --   --   --   --   --   GLUCOSE 108* 117* 99  --  119* 114* 93  --   --   --   --   --   --   BUN 19 18 21*  --  20 26* 26*  --   --   --   --   --   --   CREATININE 0.49 0.54 0.65  --  0.46 0.57 0.63  --   --   --   --   --   --   CALCIUM  8.3* 8.8* 8.6*  --  8.4* 8.0* 7.9*  --   --   --   --   --   --    CBC Recent Labs  Lab 10/28/23 0645 10/31/23 0451 11/02/23 0600 11/03/23 0451  WBC 10.8* 9.7 10.5  --   NEUTROABS 8.5* 6.9  --   --   HGB  7.4* 7.7* 6.9* 8.0*  HCT 21.3* 21.7* 19.0* 22.4*  MCV 97.3 93.9 94.5  --   PLT 144* 134* 123*  --     Medications:     calcium  carbonate  1 tablet Oral TID WC   Chlorhexidine  Gluconate Cloth  6 each Topical Daily   feeding supplement  237 mL Oral TID BM   folic acid   1 mg Oral Daily   gabapentin   300 mg Oral TID   lactulose  30 g Oral TID   lidocaine   2 patch Transdermal Q24H   midodrine   20 mg Oral TID WC   multivitamin with minerals  1 tablet Oral Daily   nicotine   21 mg Transdermal Daily   nutrition supplement (JUVEN)  1 packet Oral BID BM   octreotide   100 mcg Subcutaneous Q8H   pantoprazole   40 mg Oral BID   rifaximin  550 mg Oral BID   sucralfate   1 g Oral Q6H   thiamine   100 mg Oral Daily      [name], MD [facility] 11/03/2023, 9:20 AM

## 2023-11-03 NOTE — Progress Notes (Addendum)
 Na dropped to 118, received lasix around that time. Up to 122 at around 11am. Will give another dose of lasix around 1800 today. Continue to monitor serial Na.  [name], MD [facility]

## 2023-11-04 DIAGNOSIS — N179 Acute kidney failure, unspecified: Secondary | ICD-10-CM | POA: Diagnosis not present

## 2023-11-04 DIAGNOSIS — E871 Hypo-osmolality and hyponatremia: Secondary | ICD-10-CM | POA: Diagnosis not present

## 2023-11-04 DIAGNOSIS — E43 Unspecified severe protein-calorie malnutrition: Secondary | ICD-10-CM | POA: Diagnosis not present

## 2023-11-04 DIAGNOSIS — D62 Acute posthemorrhagic anemia: Secondary | ICD-10-CM | POA: Diagnosis not present

## 2023-11-04 DIAGNOSIS — F1090 Alcohol use, unspecified, uncomplicated: Secondary | ICD-10-CM | POA: Diagnosis not present

## 2023-11-04 DIAGNOSIS — K7290 Hepatic failure, unspecified without coma: Secondary | ICD-10-CM | POA: Diagnosis not present

## 2023-11-04 DIAGNOSIS — K7460 Unspecified cirrhosis of liver: Secondary | ICD-10-CM | POA: Diagnosis not present

## 2023-11-04 LAB — COMPREHENSIVE METABOLIC PANEL WITH GFR
ALT: 30 U/L (ref 0–44)
AST: 115 U/L — ABNORMAL HIGH (ref 15–41)
Albumin: 2.6 g/dL — ABNORMAL LOW (ref 3.5–5.0)
Alkaline Phosphatase: 63 U/L (ref 38–126)
Anion gap: 11 (ref 5–15)
BUN: 24 mg/dL — ABNORMAL HIGH (ref 6–20)
CO2: 19 mmol/L — ABNORMAL LOW (ref 22–32)
Calcium: 8.3 mg/dL — ABNORMAL LOW (ref 8.9–10.3)
Chloride: 93 mmol/L — ABNORMAL LOW (ref 98–111)
Creatinine, Ser: 0.61 mg/dL (ref 0.44–1.00)
GFR, Estimated: >60 mL/min (ref >60)
Glucose, Bld: 103 mg/dL — ABNORMAL HIGH (ref 70–99)
Potassium: 3.3 mmol/L — ABNORMAL LOW (ref 3.5–5.1)
Sodium: 123 mmol/L — ABNORMAL LOW (ref 135–145)
Total Bilirubin: 4.5 mg/dL — ABNORMAL HIGH (ref 0.0–1.2)
Total Protein: 6.5 g/dL (ref 6.5–8.1)

## 2023-11-04 LAB — SODIUM
Sodium: 120 mmol/L — ABNORMAL LOW (ref 135–145)
Sodium: 124 mmol/L — ABNORMAL LOW (ref 135–145)
Sodium: 121 mmol/L — ABNORMAL LOW (ref 135–145)
Sodium: 125 mmol/L — ABNORMAL LOW (ref 135–145)
Sodium: 124 mmol/L — ABNORMAL LOW (ref 135–145)

## 2023-11-04 LAB — CBC
HCT: 23.6 % — ABNORMAL LOW (ref 36.0–46.0)
Hemoglobin: 8.3 g/dL — ABNORMAL LOW (ref 12.0–15.0)
MCH: 33.7 pg (ref 26.0–34.0)
MCHC: 35.2 g/dL (ref 30.0–36.0)
MCV: 95.9 fL (ref 80.0–100.0)
Platelets: 125 K/uL — ABNORMAL LOW (ref 150–400)
RBC: 2.46 MIL/uL — ABNORMAL LOW (ref 3.87–5.11)
RDW: 18.8 % — ABNORMAL HIGH (ref 11.5–15.5)
WBC: 9 K/uL (ref 4.0–10.5)
nRBC: 0 % (ref 0.0–0.2)

## 2023-11-04 LAB — PROTIME-INR
INR: 1.9 — ABNORMAL HIGH (ref 0.8–1.2)
Prothrombin Time: 22.4 s — ABNORMAL HIGH (ref 11.4–15.2)

## 2023-11-04 MED ORDER — SODIUM CHLORIDE 1 G PO TABS
1.0000 g | ORAL_TABLET | Freq: Two times a day (BID) | ORAL | Status: DC
  Administered 2023-11-04 – 2023-11-10 (×14): 1 g via ORAL
  Filled 2023-11-04 (×14): qty 1

## 2023-11-04 MED ORDER — POTASSIUM CHLORIDE CRYS ER 20 MEQ PO TBCR
40.0000 meq | EXTENDED_RELEASE_TABLET | Freq: Once | ORAL | Status: AC
  Administered 2023-11-04: 40 meq via ORAL
  Filled 2023-11-04: qty 2

## 2023-11-04 MED ORDER — FUROSEMIDE 10 MG/ML IJ SOLN
40.0000 mg | Freq: Two times a day (BID) | INTRAMUSCULAR | Status: DC
Start: 2023-11-04 — End: 2023-11-11
  Administered 2023-11-04 – 2023-11-11 (×14): 40 mg via INTRAVENOUS
  Filled 2023-11-04 (×15): qty 4

## 2023-11-04 MED ORDER — ALBUMIN HUMAN 25 % IV SOLN
25.0000 g | Freq: Once | INTRAVENOUS | Status: DC | PRN
Start: 2023-11-04 — End: 2023-11-10

## 2023-11-04 NOTE — Progress Notes (Signed)
 PROGRESS NOTE    Joan Wood  FMW:[id number] DOB: 15-May-1970 DOA: 10/03/2023 PCP: Edman Meade PEDLAR, FNP   Brief Narrative: Joan Wood is a 53 y.o. female with a history of decompensated liver cirrhosis, PUD, gastric ulcer perforation status post ex lap, GERD, pancreatitis, obstructive sleep apnea.  Patient presented secondary to nausea, coffee-ground emesis, bloody diarrhea with concern for upper GI bleed.  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  Hospitalization complicated by hepatic encephalopathy treated with lactulose and rifaximin.  Patient also requiring recurrent paracenteses for her recurrent ascites.  Discharge complicated by safe discharge concerns.   Assessment and Plan:  Decompensated alcoholic cirrhosis with ascites Patient treated empirically with Ceftriaxone  for possible SBP, although ascites fluid cell count not consistent with diagnosis. Palliative care consulted for ongoing goals of care discussions. -Continue to treat hepatic encephalopathy -Continue paracenteses as needed for symptoms  Hepatic encephalopathy Improved with lactulose and Rifaximin. Patient's insurance will not cover Rifaximin on discharge. -Continue lactulose -When plan for discharge developed, will trial off Rifaximin  Transient alteration of awareness Patient developed significant change in mental status and was difficult to arose. PCCM was consulted for concern that patient would continue to decompensate. Patient spontaneously improved about 30 minutes later. Unclear etiology. She does not appear to have any post-ictal type presentation. She had an elevated temperature; no leukocytosis. Also complicated by patient's hyponatremia. Blood cultures with no growth to date.  Hypoalbuminemia Secondary to underlying liver  disease.  Thrombocytopenia Secondary to underlying liver disease.   AKI Presumed secondary to ATN from contrast. Nephrology was consulted for management. Renal function improved. Patient is not an HD candidate.  Appendicitis General surgery consulted. Patient high-risk for surgical management with recommendation for conservative management. Patient completed antibiotic regimen.  Chronic hypotension Worse this morning. Associated drop in hemoglobin, which may be etiology. Patient has received albumin  IV intermittently. -Continue midodrine  15 mg TID  Chronic hyponatremia Patient's baseline appears to be around 125 -128.  Sodium has significantly worsened. Nephrology consulted on 10/24. Lasix started with initial improvement, however sodium is back down to 118 today. -Continue fluid restriction (1,200 mL) -Nephrology recommendations (10/26): Lasix IV BID and salt tabs 1 g BID  Acute upper GI bleed Acute esophageal necrosis Acute blood loss anemia Patient seen and evaluated by gastroenterology. Upper GI endoscopy performed on 9/25 with evidence of grade D esophagitis in addition to concern for acute esophageal necrosis. Non-bleeding gastric ulcer noted with sutures. For esophageal necrosis, GI recommended carafate  for two weeks in addition to Protonix  BID and outpatient GI follow-up. Patient with recurrent anemia on 10/24 without obvious hemorrhaging noted; hemoglobin down to 6.9. Patient has received a total of 2 units of PRBC. Hemoglobin up to 8.0. -CBC in AM  Abdominal pain Presumed secondary to abdominal distension from ascites. Recurrent. -Repeat paracentesis  Lower extremity edema VTE ruled out. Likely related to hypoalbuminemia from liver disease.  Hypervolemic hyponatremia Sodium is stable.  Hypomagnesemia Improved with supplementation  Hypokalemia Resolved with potassium supplementation.  Tobacco abuse -Continue nicotine  patch  Alcohol abuse Noted.  Severe  malnutrition -Dietitian recommendations (10/21): Continue current diet as ordered Encourage PO intake Continue ordering assistance Continue Ensure Plus High Protein po TID, each supplement provides 350 kcal and 20 grams of protein. 1 packet Juven BID, each packet provides 95 calories, 2.5 grams of protein (collagen),  to support wound healing Continue MVI with minerals  daily, thiamine  and folic acid  for alcohol use history  Pressure injury Mid sacrum. Unclear if present on admission.   DVT prophylaxis: SCDs Code Status:   Code Status: Full Code Family Communication: None at bedside. Spoke to son on telephone Disposition Plan: Discharge pending safe discharge plan and stable sodium   Consultants:  Palliative care medicine Gastroenterology  Procedures:    Antimicrobials:     Subjective: No issues this morning. Doing better than yesterday.  Objective: BP (!) 93/59 (BP Location: Right Arm)   Pulse 87   Temp 98.3 F (36.8 C) (Oral)   Resp 16   Ht 5' 2 (1.575 m)   Wt 55.7 kg   SpO2 100%   BMI 22.46 kg/m   Examination:  General exam: Appears calm and comfortable. Respiratory system: Clear to auscultation. Respiratory effort normal. Cardiovascular system: S1 & S2 heard, RRR. No murmur. Gastrointestinal system: Abdomen is distended, soft and nontender. Normal bowel sounds heard. Central nervous system: Alert and oriented. No focal neurological deficits. Musculoskeletal: No edema. No calf tenderness Psychiatry: Judgement and insight appear normal. Mood & affect appropriate.    Data Reviewed: I have personally reviewed following labs and imaging studies  CBC Lab Results  Component Value Date   WBC 9.0 11/04/2023   RBC 2.46 (L) 11/04/2023   HGB 8.3 (L) 11/04/2023   HCT 23.6 (L) 11/04/2023   MCV 95.9 11/04/2023   MCH 33.7 11/04/2023   PLT 125 (L) 11/04/2023   MCHC 35.2 11/04/2023   RDW 18.8 (H) 11/04/2023   LYMPHSABS 1.3 10/31/2023   MONOABS 1.3 (H)  10/31/2023   EOSABS 0.1 10/31/2023   BASOSABS 0.0 10/31/2023     Last metabolic panel Lab Results  Component Value Date   NA 120 (L) 11/04/2023   K 3.3 (L) 11/04/2023   CL 93 (L) 11/04/2023   CO2 19 (L) 11/04/2023   BUN 24 (H) 11/04/2023   CREATININE 0.61 11/04/2023   GLUCOSE 103 (H) 11/04/2023   GFRNONAA >60 11/04/2023   CALCIUM  8.3 (L) 11/04/2023   PHOS 2.6 10/17/2023   PROT 6.5 11/04/2023   ALBUMIN  2.6 (L) 11/04/2023   LABGLOB 4.4 07/30/2023   AGRATIO 1.2 03/28/2022   BILITOT 4.5 (H) 11/04/2023   ALKPHOS 63 11/04/2023   AST 115 (H) 11/04/2023   ALT 30 11/04/2023   ANIONGAP 11 11/04/2023    GFR: Estimated Creatinine Clearance: 63.6 mL/min (by C-G formula based on SCr of 0.61 mg/dL).  Recent Results (from the past 240 hours)  Culture, blood (Routine X 2) w Reflex to ID Panel     Status: None (Preliminary result)   Collection Time: 11/02/23  5:00 PM   Specimen: BLOOD  Result Value Ref Range Status   Specimen Description BLOOD BLOOD RIGHT ARM  Final   Special Requests   Final    BOTTLES DRAWN AEROBIC AND ANAEROBIC Blood Culture results may not be optimal due to an inadequate volume of blood received in culture bottles   Culture   Final    NO GROWTH 2 DAYS Performed at [facility], [address], [place], [name] [address]    Report Status PENDING  Incomplete       Radiology Studies: No results found.     LOS: 32 days    Elgin Lam, MD [facility] 11/04/2023, 12:03 PM   If 7PM-7AM, please contact night-coverage [email or web address]

## 2023-11-04 NOTE — Progress Notes (Signed)
 [facility] Progress Note    Assessment/ Plan:   Joan Wood is an 53 y.o. 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 Reconsulted 10/24 for worsening hyponatremia (Na down to 118)  Severe hyponatremia:  In the setting of decompensated cirrhosis, hypervolemia. -acute on chronic. Asymptomatic currently. Poor prognostic indicator -volume status currently: hypervolemic (abd distension) -una <30, uosm 394, sosm 266 -fluid restrict: decreased to 1.2L/day, reinforced this again with patient today -lasix: start standing 40mg  IV bid -starting salt tabs 1g BID (cautious with this given higher propensity to re-accumulate ascites) -check serial Na every 4 hours, avoid overcorrection -if Na is downtrending despite aforementioned therapies, then will need 3%  -not a candidate for urea or tolvaptan  Hypotension, chronic -increased midodrine  to 20mg  TID 10/24- continue -started octreotide  10/24-continue -BP low but stable  Hypokalemia -agree with repletion today, extra kcl 40meq dose ordered for this afternoon.  Continue to replete as needed   AKI:  Baseline Cr 0.4, rose to 0.6 on 10/4, 1.1 10/5, 1.4 10/6.  She had CT w contrast 10/2. BP chronically low 80/50s -90/50s already on midodrine .  UA last 10/1 wasn't a clean catch - repeat 10/6 - looks bland.   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 Will cont to monitor. 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  Ok for moderate volume paracentesis PRN + IV albumin  25g/L on cue to paracentesis.  -watch closely since  starting salt tabs   Anemia:  ABLA in the setting of recent ulcers, severe esophagitis.  Transfuse as needed for hemoglobin.  Hemoglobin stable 8.3 today   Appendicitis:  surgery recommended abx, high risk for surgery and no plans to proceed to OR.  If need for reimaging arises please try to avoid contrast. Per primary  Poor prognosis per charting, palliative care following. Discussed with primary service  [name], MD [facility]  Subjective:   Patient seen and examined bedside. No new complaints/issues. Again, Na increased but dropped back down to 120 most recently.   Objective:   BP (!) 93/59 (BP Location: Right Arm)   Pulse 87   Temp 98.3 F (36.8 C) (Oral)   Resp 16   Ht 5' 2 (1.575 m)   Wt 55.7 kg   SpO2 100%   BMI 22.46 kg/m   Intake/Output Summary (Last 24 hours) at 11/04/2023 9094 Last data filed at 11/04/2023 0600 Gross per 24 hour  Intake 2760 ml  Output 1300 ml  Net 1460 ml   Weight change:   Physical Exam: Gen: chronically ill appearing, NAD HEENT: incteric sclera CVS: RRR Resp: CTA B/L, no w/r/r/c, unlabored, normal wob, speaking in full sentences Abd: soft, distended with +fluid shift, RLQ tenderness on palpation Ext: no edema b/l Les Neuro: awake, alert GU: +foley  Imaging: No results found.  Labs: BMET Recent Labs  Lab 10/29/23 1014 10/31/23 0451 10/31/23 2135 11/01/23 0130 11/02/23 0119 11/02/23 0600 11/02/23 1520 11/03/23 0851 11/03/23 1113 11/03/23 1531 11/03/23 1905 11/03/23 2246 11/04/23 0241 11/04/23 0723  NA 124* 121*  --  124* 119* 118*   < > 118* 122* 122* 123*  123* 123* 120*  K 3.5 2.9* 2.8* 3.1* 5.2* 4.7  --  4.0  --   --   --   --  3.3*  --   CL 93* 92*  --  94* 93* 92*  --  91*  --   --   --   --  93*  --   CO2 21* 19*  --  19* 17* 18*  --  18*  --   --   --   --  19*  --   GLUCOSE 117* 99  --  119* 114* 93  --  112*  --   --   --   --  103*  --   BUN 18 21*  --  20 26* 26*  --  22*  --   --   --    --  24*  --   CREATININE 0.54 0.65  --  0.46 0.57 0.63  --  0.55  --   --   --   --  0.61  --   CALCIUM  8.8* 8.6*  --  8.4* 8.0* 7.9*  --  8.1*  --   --   --   --  8.3*  --    < > = values in this interval not displayed.   CBC Recent Labs  Lab 10/31/23 0451 11/02/23 0600 11/03/23 0451 11/04/23 0241  WBC 9.7 10.5  --  9.0  NEUTROABS 6.9  --   --   --   HGB 7.7* 6.9* 8.0* 8.3*  HCT 21.7* 19.0* 22.4* 23.6*  MCV 93.9 94.5  --  95.9  PLT 134* 123*  --  125*    Medications:     calcium  carbonate  1 tablet Oral TID WC   Chlorhexidine  Gluconate Cloth  6 each Topical Daily   feeding supplement  237 mL Oral TID BM   folic acid   1 mg Oral Daily   furosemide  40 mg Intravenous BID   gabapentin   300 mg Oral TID   lactulose  30 g Oral TID   lidocaine   2 patch Transdermal Q24H   midodrine   20 mg Oral TID WC   multivitamin with minerals  1 tablet Oral Daily   nicotine   21 mg Transdermal Daily   nutrition supplement (JUVEN)  1 packet Oral BID BM   octreotide   100 mcg Subcutaneous Q8H   pantoprazole   40 mg Oral BID   potassium chloride   40 mEq Oral Once   rifaximin  550 mg Oral BID   sodium chloride   1 g Oral BID WC   sucralfate   1 g Oral Q6H   thiamine   100 mg Oral Daily      [name], MD [facility] 11/04/2023, 9:05 AM

## 2023-11-05 ENCOUNTER — Inpatient Hospital Stay (HOSPITAL_COMMUNITY)

## 2023-11-05 DIAGNOSIS — E871 Hypo-osmolality and hyponatremia: Secondary | ICD-10-CM | POA: Diagnosis not present

## 2023-11-05 DIAGNOSIS — K7460 Unspecified cirrhosis of liver: Secondary | ICD-10-CM | POA: Diagnosis not present

## 2023-11-05 DIAGNOSIS — K7290 Hepatic failure, unspecified without coma: Secondary | ICD-10-CM | POA: Diagnosis not present

## 2023-11-05 DIAGNOSIS — E43 Unspecified severe protein-calorie malnutrition: Secondary | ICD-10-CM | POA: Diagnosis not present

## 2023-11-05 DIAGNOSIS — F1090 Alcohol use, unspecified, uncomplicated: Secondary | ICD-10-CM | POA: Diagnosis not present

## 2023-11-05 DIAGNOSIS — R188 Other ascites: Secondary | ICD-10-CM | POA: Diagnosis not present

## 2023-11-05 DIAGNOSIS — D62 Acute posthemorrhagic anemia: Secondary | ICD-10-CM | POA: Diagnosis not present

## 2023-11-05 DIAGNOSIS — Z515 Encounter for palliative care: Secondary | ICD-10-CM | POA: Diagnosis not present

## 2023-11-05 DIAGNOSIS — Z7189 Other specified counseling: Secondary | ICD-10-CM | POA: Diagnosis not present

## 2023-11-05 DIAGNOSIS — I959 Hypotension, unspecified: Secondary | ICD-10-CM | POA: Diagnosis not present

## 2023-11-05 DIAGNOSIS — N179 Acute kidney failure, unspecified: Secondary | ICD-10-CM | POA: Diagnosis not present

## 2023-11-05 HISTORY — PX: IR PARACENTESIS: IMG2679

## 2023-11-05 LAB — PROTIME-INR
INR: 2 — ABNORMAL HIGH (ref 0.8–1.2)
Prothrombin Time: 23.3 s — ABNORMAL HIGH (ref 11.4–15.2)

## 2023-11-05 LAB — SODIUM
Sodium: 123 mmol/L — ABNORMAL LOW (ref 135–145)
Sodium: 124 mmol/L — ABNORMAL LOW (ref 135–145)
Sodium: 125 mmol/L — ABNORMAL LOW (ref 135–145)
Sodium: 124 mmol/L — ABNORMAL LOW (ref 135–145)

## 2023-11-05 LAB — BASIC METABOLIC PANEL WITH GFR
Anion gap: 12 (ref 5–15)
BUN: 17 mg/dL (ref 6–20)
CO2: 19 mmol/L — ABNORMAL LOW (ref 22–32)
Calcium: 8.1 mg/dL — ABNORMAL LOW (ref 8.9–10.3)
Chloride: 95 mmol/L — ABNORMAL LOW (ref 98–111)
Creatinine, Ser: 0.6 mg/dL (ref 0.44–1.00)
GFR, Estimated: >60 mL/min (ref >60)
Glucose, Bld: 151 mg/dL — ABNORMAL HIGH (ref 70–99)
Potassium: 3.6 mmol/L (ref 3.5–5.1)
Sodium: 126 mmol/L — ABNORMAL LOW (ref 135–145)

## 2023-11-05 MED ORDER — LIDOCAINE-EPINEPHRINE 1 %-1:100000 IJ SOLN
20.0000 mL | Freq: Once | INTRAMUSCULAR | Status: AC
  Administered 2023-11-05: 10 mL via INTRADERMAL

## 2023-11-05 MED ORDER — LIDOCAINE-EPINEPHRINE 1 %-1:100000 IJ SOLN
INTRAMUSCULAR | Status: AC
  Filled 2023-11-05: qty 1

## 2023-11-05 NOTE — Plan of Care (Signed)

## 2023-11-05 NOTE — Procedures (Signed)
 PROCEDURE SUMMARY:  Successful ultrasound guided paracentesis from the right lower quadrant.  Yielded 1.6 liters of straw colored fluid.  No immediate complications.  The patient tolerated the procedure well.    EBL < 2 mL  The patient has required >/=2 paracenteses in a 30 day period and a screening evaluation by the [facility] has been arranged.

## 2023-11-05 NOTE — Progress Notes (Signed)
 [facility] Progress Note    Assessment/ Plan:   Joan Wood is an 53 y.o. 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 Reconsulted 10/24 for worsening hyponatremia (Na down to 118)  Severe hyponatremia:  In the setting of decompensated cirrhosis, hypervolemia. -acute on chronic. Asymptomatic currently. Poor prognostic indicator. Fairly stable at 123. -volume status currently: hypervolemic (abd distension) -una <30, uosm 394, sosm 266 -fluid restrict: decreased to 1.2L/day; she states she's been compliant. Reinforced this again with patient today -lasix: started standing 40mg  IV bid and great UOP. -starting salt tabs 1g BID (cautious with this given higher propensity to re-accumulate ascites) -if Na is downtrending despite aforementioned therapies, then will need 3%.   -not a candidate for urea or tolvaptan  Hypotension, chronic -increased midodrine  to 20mg  TID 10/24- continue -started octreotide  10/24-continue -BP low but stable  Hypokalemia -repletion as needed   AKI:  Baseline Cr 0.4, rose to 0.6 on 10/4, 1.1 10/5, 1.4 10/6.  She had CT w contrast 10/2. BP chronically low 80/50s -90/50s already on midodrine .  UA last 10/1 wasn't a clean catch - repeat 10/6 - looks bland.   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 Will cont to monitor. 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  Ok for moderate volume paracentesis PRN + IV albumin  25g/L on cue to paracentesis.  -watch closely since starting salt tabs   Anemia:  ABLA in the setting of recent  ulcers, severe esophagitis.  Transfuse as needed for hemoglobin.  Hemoglobin stable 8.3 today   Appendicitis:  surgery recommended abx, high risk for surgery and no plans to proceed to OR.  If need for reimaging arises please try to avoid contrast. Per primary  Poor prognosis per charting, palliative care following.    Subjective:   Patient seen and examined bedside. No new complaints/issues. Again, Na fairly stable at 123.    Objective:   BP 94/61 (BP Location: Left Arm)   Pulse 85   Temp 98.2 F (36.8 C) (Oral)   Resp 18   Ht 5' 2 (1.575 m)   Wt 55.7 kg   SpO2 98%   BMI 22.46 kg/m   Intake/Output Summary (Last 24 hours) at 11/05/2023 9171 Last data filed at 11/04/2023 2300 Gross per 24 hour  Intake 1400 ml  Output 2850 ml  Net -1450 ml   Weight change:   Physical Exam: Gen: chronically ill appearing, NAD HEENT: incteric sclera CVS: RRR Resp: CTA B/L, no w/r/r/c, unlabored, normal wob, speaking in full sentences Abd: soft, distended with +fluid shift, RLQ tenderness on palpation Ext: no edema b/l Les Neuro: awake, alert GU: +foley  Imaging: No results found.  Labs: BMET Recent Labs  Lab 10/29/23 1014 10/31/23 0451 10/31/23 2135 11/01/23 0130 11/02/23 0119 11/02/23 0600 11/02/23 1520 11/03/23 0851 11/03/23 1113 11/04/23 0241 11/04/23 0723 11/04/23 1121 11/04/23 1501 11/04/23 1849 11/04/23 2227 11/05/23 0201  NA 124* 121*  --  124* 119* 118*   < > 118*   < > 123* 120* 124* 121* 125* 124* 123*  K 3.5 2.9* 2.8* 3.1* 5.2* 4.7  --  4.0  --  3.3*  --   --   --   --   --   --   CL 93* 92*  --  94* 93* 92*  --  91*  --  93*  --   --   --   --   --   --   CO2 21* 19*  --  19* 17* 18*  --  18*  --  19*  --   --   --   --   --   --   GLUCOSE 117* 99  --  119* 114* 93  --  112*  --  103*  --   --   --   --   --   --   BUN 18 21*  --  20 26* 26*  --  22*  --  24*  --   --   --   --   --   --   CREATININE 0.54 0.65  --  0.46 0.57 0.63  --  0.55  --  0.61  --    --   --   --   --   --   CALCIUM  8.8* 8.6*  --  8.4* 8.0* 7.9*  --  8.1*  --  8.3*  --   --   --   --   --   --    < > = values in this interval not displayed.   CBC Recent Labs  Lab 10/31/23 0451 11/02/23 0600 11/03/23 0451 11/04/23 0241  WBC 9.7 10.5  --  9.0  NEUTROABS 6.9  --   --   --   HGB 7.7* 6.9* 8.0* 8.3*  HCT 21.7* 19.0* 22.4* 23.6*  MCV 93.9 94.5  --  95.9  PLT 134* 123*  --  125*    Medications:     calcium  carbonate  1 tablet Oral TID WC   Chlorhexidine  Gluconate Cloth  6 each Topical Daily   feeding supplement  237 mL Oral TID BM   folic acid   1 mg Oral Daily   furosemide  40 mg Intravenous BID   gabapentin   300 mg Oral TID   lactulose  30 g Oral TID   lidocaine   2 patch Transdermal Q24H   midodrine   20 mg Oral TID WC   multivitamin with minerals  1 tablet Oral Daily   nicotine   21 mg Transdermal Daily   nutrition supplement (JUVEN)  1 packet Oral BID BM   octreotide   100 mcg Subcutaneous Q8H   pantoprazole   40 mg Oral BID   rifaximin  550 mg Oral BID   sodium chloride   1 g Oral BID WC   sucralfate   1 g Oral Q6H   thiamine   100 mg Oral Daily

## 2023-11-05 NOTE — Progress Notes (Signed)
 Patient ID: Joan Wood, female   DOB: 09-29-1970, 53 y.o.   MRN: [id number]    Progress Note from the Palliative Medicine Team at [facility]   Patient Name: Joan Wood        Date: 11/05/2023 DOB: 1970/12/31  Age: 53 y.o. MRN#: [id number] Attending Physician: Briana Elgin LABOR, MD Primary Care Physician: Edman Meade PEDLAR, FNP Admit Date: 10/03/2023   Reason for Consultation/Follow-up   Establishing Goals of Care   HPI/ Brief Hospital Review  53 y.o. female  with past medical history of decompensated liver cirrhosis, PUD, gastric ulcer perforation status post ex lap, GERD, pancreatitis, obstructive sleep apnea.  Patient presented secondary to nausea, coffee-ground emesis, bloody diarrhea with concern for upper GI bleed.  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  Hospitalization complicated by hepatic encephalopathy treated with lactulose and rifaximin.  Patient also requiring recurrent paracenteses for her recurrent ascites.  Discharge complicated by safe discharge concerns, basically patient is homeless  Patient faces ongoing treatment option decisions, advanced directive decisions and anticipatory care needs.   Subjective  Extensive chart review has been completed prior to meeting with patient/family  including labs, vital signs, imaging, progress/consult notes, orders, medications and available advance directive documents.    This NP assessed patient at the bedside as a follow up for Palliative medicine needs and emotional support. Values and goals of care important to patient and family were attempted to be elicited.  Education offered on the seriousness of her current medical situation specific to her cirrhosis and its likely trajectory and associated poor prognosis.  Again a   discussion was had today regarding advanced  directives.  Concepts specific to code status, artifical feeding and hydration, continued IV antibiotics and rehospitalization was had.   Educated patient to consider DNR/DNI status understanding evidenced based poor outcomes in similar hospitalized patient, as the cause of arrest is likely associated with advanced chronic illness rather than an easily reversible acute cardio-pulmonary event.  The difference between a aggressive medical intervention path  and a palliative comfort care path for this patient at this time was had.  We explored her current social situation as it relates to future living environment needs and increasing personal  care needs.    Although patient acknowledges her medical situation, I worry about her insight into the seriousness of her condition, long-term prognosis and her anticipatory care needs.  She tells me her children want her closer to them in the [place] area and it  feels good to feel wanted.   Today she tells me she would like that too.       Education offered today regarding  the importance of continued conversation with family and the  medical providers regarding overall plan of care and treatment options,  ensuring decisions are within the context of the patients values and GOCs   I was able to speak to patient's son/Joan Wood by telephone.  Patient's children ultimately hope that patient can discharge closer to where they live in the [place] area whether that be a SNF for rehabilitation , long-term care,  assisted living or rehab.  Education offered on the seriousness of patient's current medical situation         Family meeting is planned for Wednesday, October 29 at 10:30 AM  Questions and concerns addressed     This is a difficult situation that we will need  ongoing discussion and support as we face pending medical and psychosocial issues.  Discussed with attending and TOC team via secure chat  Time: 50  minutes  Detailed review of medical  records ( labs, imaging, vital signs), medically appropriate exam ( MS, skin, cardiac,  resp)   discussed with treatment team, counseling and education to patient, family, staff, documenting clinical information, medication management, coordination of care    Ronal Plants NP  Palliative Medicine Team Team Phone # [phone] Pager [phone]

## 2023-11-05 NOTE — TOC Progression Note (Signed)
 Transition of Care [facility]) - Progression Note    Patient Details  Name: ANJELIQUE MAKAR MRN: [id number] Date of Birth: [birth year]  Transition of Care [facility] CM/SW Contact  Andrez JULIANNA George, RN Phone Number: 11/05/2023, 1:28 PM  Clinical Narrative:     S/p paracentesis today. Palliative care working with family/ pt. Pt is currently homeless.  IP Care management following.  Expected Discharge Plan:  (TBD) Barriers to Discharge: Homeless with medical needs, Inadequate or no insurance, Continued Medical Work up, Unsafe home situation               Expected Discharge Plan and Services In-house Referral: Clinical Social Work Discharge Planning Services: CM Consult Post Acute Care Choice: Home Health Living arrangements for the past 2 months: Single Family Home                 DME Arranged: [place] rolling   Date DME Agency Contacted: 10/08/23   Representative spoke with at DME Agency: [name]             Social Drivers of Health (SDOH) Interventions SDOH Screenings   Food Insecurity: No Food Insecurity (10/03/2023)  Housing: Low Risk  (10/03/2023)  Transportation Needs: No Transportation Needs (10/03/2023)  Recent Concern: Transportation Needs - Unmet Transportation Needs (08/20/2023)  Utilities: Not At Risk (10/03/2023)  Depression (PHQ2-9): High Risk (07/30/2023)  Social Connections: Socially Isolated (10/03/2023)  Tobacco Use: High Risk (10/04/2023)    Readmission Risk Interventions    10/04/2023    9:02 AM 09/03/2023   12:31 PM 09/02/2023    9:01 AM  Readmission Risk Prevention Plan  Transportation Screening Complete Complete Complete  PCP or Specialist Appt within 3-5 Days   Complete  HRI or Home Care Consult   Complete  Social Work Consult for Recovery Care Planning/Counseling   Complete  Palliative Care Screening   Not Applicable  Medication Review [employer]) Complete Complete Complete  HRI or Home Care Consult Complete Complete   SW Recovery  Care/Counseling Consult Complete Complete   Palliative Care Screening Not Applicable Not Applicable   Skilled Nursing Facility Not Applicable Patient Refused

## 2023-11-05 NOTE — Progress Notes (Signed)
 PROGRESS NOTE    Joan Wood  FMW:[id number] DOB: 1970-11-19 DOA: 10/03/2023 PCP: Edman Meade PEDLAR, FNP   Brief Narrative: Joan Wood is a 53 y.o. female with a history of decompensated liver cirrhosis, PUD, gastric ulcer perforation status post ex lap, GERD, pancreatitis, obstructive sleep apnea.  Patient presented secondary to nausea, coffee-ground emesis, bloody diarrhea with concern for upper GI bleed.  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  Hospitalization complicated by hepatic encephalopathy treated with lactulose and rifaximin.  Patient also requiring recurrent paracenteses for her recurrent ascites.  Discharge complicated by safe discharge concerns.   Assessment and Plan:  Decompensated alcoholic cirrhosis with ascites Patient treated empirically with Ceftriaxone  for possible SBP, although ascites fluid cell count not consistent with diagnosis. Palliative care consulted for ongoing goals of care discussions. -Continue to treat hepatic encephalopathy -Continue paracenteses as needed for symptoms; repeat ordered  Hepatic encephalopathy Improved with lactulose and Rifaximin. Patient's insurance will not cover Rifaximin on discharge. -Continue lactulose -When plan for discharge developed, will trial off Rifaximin  Transient alteration of awareness Patient developed significant change in mental status and was difficult to arose. PCCM was consulted for concern that patient would continue to decompensate. Patient spontaneously improved about 30 minutes later. Unclear etiology. She does not appear to have any post-ictal type presentation. She had an elevated temperature; no leukocytosis. Also complicated by patient's hyponatremia. Blood cultures with no growth to date.  Hypoalbuminemia Secondary to underlying liver  disease.  Thrombocytopenia Secondary to underlying liver disease.   AKI Presumed secondary to ATN from contrast. Nephrology was consulted for management. Renal function improved. Patient is not an HD candidate.  Appendicitis General surgery consulted. Patient high-risk for surgical management with recommendation for conservative management. Patient completed antibiotic regimen.  Chronic hypotension Worse this morning. Associated drop in hemoglobin, which may be etiology. Patient has received albumin  IV intermittently. -Continue midodrine  15 mg TID  Chronic hyponatremia Patient's baseline appears to be around 125 -128.  Sodium has significantly worsened. Nephrology consulted on 10/24. Lasix started with initial improvement, however sodium is back down to 118 today. -Continue fluid restriction (1,200 mL) -Nephrology recommendations (10/27): Lasix IV BID and salt tabs 1 g BID  Acute upper GI bleed Acute esophageal necrosis Acute blood loss anemia Patient seen and evaluated by gastroenterology. Upper GI endoscopy performed on 9/25 with evidence of grade D esophagitis in addition to concern for acute esophageal necrosis. Non-bleeding gastric ulcer noted with sutures. For esophageal necrosis, GI recommended carafate  for two weeks in addition to Protonix  BID and outpatient GI follow-up. Patient with recurrent anemia on 10/24 without obvious hemorrhaging noted; hemoglobin down to 6.9. Patient has received a total of 2 units of PRBC. Hemoglobin up to 8.0 post-transfusion. Sable.  Abdominal pain Presumed secondary to abdominal distension from ascites. Recurrent. -Repeat paracentesis  Lower extremity edema VTE ruled out. Likely related to hypoalbuminemia from liver disease.  Hypervolemic hyponatremia Sodium is stable.  Hypomagnesemia Improved with supplementation  Hypokalemia Resolved with potassium supplementation.  Tobacco abuse -Continue nicotine  patch  Alcohol  abuse Noted.  Severe malnutrition -Dietitian recommendations (10/21): Continue current diet as ordered Encourage PO intake Continue ordering assistance Continue Ensure Plus High Protein po TID, each supplement provides 350 kcal and 20 grams of protein. 1 packet Juven BID, each packet provides 95 calories, 2.5 grams of protein (collagen),  to support wound healing Continue MVI with  minerals daily, thiamine  and folic acid  for alcohol use history  Pressure injury Mid sacrum. Unclear if present on admission.   DVT prophylaxis: SCDs Code Status:   Code Status: Full Code Family Communication: None at bedside. Disposition Plan: Discharge pending safe discharge plan and stable sodium   Consultants:  Palliative care medicine Gastroenterology  Procedures:    Antimicrobials:     Subjective: Still with abdominal pain. No issues overnight or this morning.  Objective: BP 94/61 (BP Location: Left Arm)   Pulse 85   Temp 98.2 F (36.8 C) (Oral)   Resp 18   Ht 5' 2 (1.575 m)   Wt 55.7 kg   SpO2 98%   BMI 22.46 kg/m   Examination:  General exam: Appears calm and comfortable. Respiratory system: Clear to auscultation. Respiratory effort normal. Cardiovascular system: S1 & S2 heard, RRR. No murmur. Gastrointestinal system: Abdomen is distended, soft and tender. Central nervous system: Alert and oriented. No focal neurological deficits. Musculoskeletal: No edema. No calf tenderness   Data Reviewed: I have personally reviewed following labs and imaging studies  CBC Lab Results  Component Value Date   WBC 9.0 11/04/2023   RBC 2.46 (L) 11/04/2023   HGB 8.3 (L) 11/04/2023   HCT 23.6 (L) 11/04/2023   MCV 95.9 11/04/2023   MCH 33.7 11/04/2023   PLT 125 (L) 11/04/2023   MCHC 35.2 11/04/2023   RDW 18.8 (H) 11/04/2023   LYMPHSABS 1.3 10/31/2023   MONOABS 1.3 (H) 10/31/2023   EOSABS 0.1 10/31/2023   BASOSABS 0.0 10/31/2023     Last metabolic panel Lab Results   Component Value Date   NA 123 (L) 11/05/2023   K 3.3 (L) 11/04/2023   CL 93 (L) 11/04/2023   CO2 19 (L) 11/04/2023   BUN 24 (H) 11/04/2023   CREATININE 0.61 11/04/2023   GLUCOSE 103 (H) 11/04/2023   GFRNONAA >60 11/04/2023   CALCIUM  8.3 (L) 11/04/2023   PHOS 2.6 10/17/2023   PROT 6.5 11/04/2023   ALBUMIN  2.6 (L) 11/04/2023   LABGLOB 4.4 07/30/2023   AGRATIO 1.2 03/28/2022   BILITOT 4.5 (H) 11/04/2023   ALKPHOS 63 11/04/2023   AST 115 (H) 11/04/2023   ALT 30 11/04/2023   ANIONGAP 11 11/04/2023    GFR: Estimated Creatinine Clearance: 63.6 mL/min (by C-G formula based on SCr of 0.61 mg/dL).  Recent Results (from the past 240 hours)  Culture, blood (Routine X 2) w Reflex to ID Panel     Status: None (Preliminary result)   Collection Time: 11/02/23  3:20 PM   Specimen: BLOOD RIGHT ARM  Result Value Ref Range Status   Specimen Description BLOOD RIGHT ARM  Final   Special Requests   Final    BOTTLES DRAWN AEROBIC AND ANAEROBIC Blood Culture results may not be optimal due to an inadequate volume of blood received in culture bottles   Culture   Final    NO GROWTH 3 DAYS Performed at [facility], [address] [place], [name] [address]    Report Status PENDING  Incomplete  Culture, blood (Routine X 2) w Reflex to ID Panel     Status: None (Preliminary result)   Collection Time: 11/02/23  5:00 PM   Specimen: BLOOD  Result Value Ref Range Status   Specimen Description BLOOD BLOOD RIGHT ARM  Final   Special Requests   Final    BOTTLES DRAWN AEROBIC AND ANAEROBIC Blood Culture results may not be optimal due to an inadequate volume of  blood received in culture bottles   Culture   Final    NO GROWTH 3 DAYS Performed at [facility], [address] [place], [name] [address]    Report Status PENDING  Incomplete       Radiology Studies: No results found.     LOS: 33 days    Elgin Lam, MD [facility] 11/05/2023, 9:39 AM   If 7PM-7AM, please  contact night-coverage [email or web address]

## 2023-11-05 NOTE — Plan of Care (Signed)
   Problem: Clinical Measurements: Goal: Ability to maintain clinical measurements within normal limits will improve Outcome: Progressing

## 2023-11-06 DIAGNOSIS — Z7189 Other specified counseling: Secondary | ICD-10-CM | POA: Diagnosis not present

## 2023-11-06 DIAGNOSIS — N179 Acute kidney failure, unspecified: Secondary | ICD-10-CM | POA: Diagnosis not present

## 2023-11-06 DIAGNOSIS — K7290 Hepatic failure, unspecified without coma: Secondary | ICD-10-CM | POA: Diagnosis not present

## 2023-11-06 DIAGNOSIS — D62 Acute posthemorrhagic anemia: Secondary | ICD-10-CM | POA: Diagnosis not present

## 2023-11-06 DIAGNOSIS — F1090 Alcohol use, unspecified, uncomplicated: Secondary | ICD-10-CM | POA: Diagnosis not present

## 2023-11-06 DIAGNOSIS — K7460 Unspecified cirrhosis of liver: Secondary | ICD-10-CM | POA: Diagnosis not present

## 2023-11-06 DIAGNOSIS — E871 Hypo-osmolality and hyponatremia: Secondary | ICD-10-CM | POA: Diagnosis not present

## 2023-11-06 DIAGNOSIS — Z515 Encounter for palliative care: Secondary | ICD-10-CM | POA: Diagnosis not present

## 2023-11-06 DIAGNOSIS — E43 Unspecified severe protein-calorie malnutrition: Secondary | ICD-10-CM | POA: Diagnosis not present

## 2023-11-06 DIAGNOSIS — I959 Hypotension, unspecified: Secondary | ICD-10-CM | POA: Diagnosis not present

## 2023-11-06 LAB — BASIC METABOLIC PANEL WITH GFR
Anion gap: 8 (ref 5–15)
BUN: 24 mg/dL — ABNORMAL HIGH (ref 6–20)
CO2: 22 mmol/L (ref 22–32)
Calcium: 7.9 mg/dL — ABNORMAL LOW (ref 8.9–10.3)
Chloride: 92 mmol/L — ABNORMAL LOW (ref 98–111)
Creatinine, Ser: 0.89 mg/dL (ref 0.44–1.00)
GFR, Estimated: >60 mL/min (ref >60)
Glucose, Bld: 99 mg/dL (ref 70–99)
Potassium: 3.3 mmol/L — ABNORMAL LOW (ref 3.5–5.1)
Sodium: 122 mmol/L — ABNORMAL LOW (ref 135–145)

## 2023-11-06 LAB — SODIUM
Sodium: 122 mmol/L — ABNORMAL LOW (ref 135–145)
Sodium: 125 mmol/L — ABNORMAL LOW (ref 135–145)
Sodium: 125 mmol/L — ABNORMAL LOW (ref 135–145)
Sodium: 125 mmol/L — ABNORMAL LOW (ref 135–145)
Sodium: 124 mmol/L — ABNORMAL LOW (ref 135–145)

## 2023-11-06 LAB — PROTIME-INR
INR: 2 — ABNORMAL HIGH (ref 0.8–1.2)
Prothrombin Time: 23.9 s — ABNORMAL HIGH (ref 11.4–15.2)

## 2023-11-06 MED ORDER — ONDANSETRON HCL 4 MG/2ML IJ SOLN
4.0000 mg | Freq: Three times a day (TID) | INTRAMUSCULAR | Status: AC | PRN
  Administered 2023-11-06 – 2023-11-07 (×3): 4 mg via INTRAVENOUS
  Filled 2023-11-06 (×3): qty 2

## 2023-11-06 NOTE — Progress Notes (Signed)
 [facility] Progress Note    Assessment/ Plan:   Joan Wood is an 53 y.o. 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 Reconsulted 10/24 for worsening hyponatremia (Na down to 118)  Severe hyponatremia:  In the setting of decompensated cirrhosis, hypervolemia. -acute on chronic. Asymptomatic currently. Poor prognostic indicator. Fairly stable at 123. -volume status currently: hypervolemic (abd distension) -una <30, uosm 394, sosm 266 -fluid restrict: decreased to 1.2L/day; she states she's been compliant. Reinforced this again with patient today -lasix: started standing 40mg  IV bid and great UOP. -starting salt tabs 1g BID (cautious with this given higher propensity to re-accumulate ascites) -if Na is downtrending despite aforementioned therapies, then will need 3%.   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 Will give ondansetron  q8 x3 doses.  Hypotension, chronic -increased midodrine  to 20mg  TID 10/24- continue -started octreotide  10/24-continue -BP low but stable  Hypokalemia -repletion as needed   AKI:  Baseline Cr 0.4, rose to 0.6 on 10/4, 1.1 10/5, 1.4 10/6.  She had CT w contrast 10/2. BP chronically low 80/50s -90/50s already on midodrine .  UA last 10/1 wasn't a clean catch - repeat 10/6 - looks bland.   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 Will cont to monitor. Cr remains stable -Poor candidate for dialysis should need arise,  fortunately renal function improved. Would not recommend it given high mortality risk with dialysis and cirrhosis   Decompensated Cirrhosis:  alcohol in etiology.  Ok for moderate volume paracentesis PRN + IV albumin   25g/L on cue to paracentesis.  -watch closely since starting salt tabs   Anemia:  ABLA in the setting of recent ulcers, severe esophagitis.  Transfuse as needed for hemoglobin.  Hemoglobin stable 8.3 today   Appendicitis:  surgery recommended abx, high risk for surgery and no plans to proceed to OR.  If need for reimaging arises please try to avoid contrast. Per primary  Poor prognosis per charting, palliative care following.    Subjective:   Patient seen and examined bedside. Nausea present now. Na had dropped to 122 but on repeat 125.     Objective:   BP (!) 95/56 (BP Location: Left Arm)   Pulse 81   Temp 98.7 F (37.1 C) (Oral)   Resp 10   Ht 5' 2 (1.575 m)   Wt 55.7 kg   SpO2 100%   BMI 22.46 kg/m   Intake/Output Summary (Last 24 hours) at 11/06/2023 1202 Last data filed at 11/06/2023 1055 Gross per 24 hour  Intake 1090 ml  Output 950 ml  Net 140 ml   Weight change:   Physical Exam: Gen: chronically ill appearing, NAD HEENT: incteric sclera CVS: RRR Resp: CTA B/L, no w/r/r/c, unlabored, normal wob, speaking in full sentences Abd: soft, distended with +fluid shift, RLQ tenderness on palpation Ext: no edema b/l Les Neuro: awake, alert GU: +foley  Imaging: IR Paracentesis Result Date: 11/05/2023 INDICATION: 53 year old female. History decompensated cirrhosis with recurrent ascites. Request for therapeutic paracentesis. EXAM: ULTRASOUND GUIDED THERAPEUTIC RIGHT-SIDED PARACENTESIS MEDICATIONS: Lidocaine  1% 10 mL COMPLICATIONS: None immediate. PROCEDURE: Informed written consent was obtained from the patient after a discussion of the risks, benefits and alternatives to treatment. A timeout was  performed prior to the initiation of the procedure. Initial ultrasound scanning demonstrates a small amount of ascites within the right lower abdominal quadrant. The right lower abdomen was prepped and draped in the usual sterile fashion. 1% lidocaine  was used for local  anesthesia. Following this, a 19 gauge, 7-cm, Yueh catheter was introduced. An ultrasound image was saved for documentation purposes. The paracentesis was performed. The catheter was removed and a dressing was applied. The patient tolerated the procedure well without immediate post procedural complication. FINDINGS: A total of approximately 1.6 L of straw-colored fluid was removed. IMPRESSION: Successful ultrasound-guided paracentesis yielding 1.6 liters of straw-colored peritoneal fluid. Performed by Delon Beagle NP PLAN: The patient has required >/=2 paracenteses in a 30 day period and a formal evaluation by the [facility] has been arranged. Electronically Signed   By: Juliene Balder M.D.   On: 11/05/2023 12:08    Labs: BMET Recent Labs  Lab 11/01/23 0130 11/02/23 0119 11/02/23 0600 11/02/23 1520 11/03/23 0851 11/03/23 1113 11/04/23 0241 11/04/23 0723 11/05/23 0855 11/05/23 1051 11/05/23 1532 11/05/23 2023 11/05/23 2327 11/06/23 0824 11/06/23 1113  NA 124* 119* 118*   < > 118*   < > 123*   < > 124* 126* 125* 124* 122* 122* 125*  K 3.1* 5.2* 4.7  --  4.0  --  3.3*  --   --  3.6  --   --   --  3.3*  --   CL 94* 93* 92*  --  91*  --  93*  --   --  95*  --   --   --  92*  --   CO2 19* 17* 18*  --  18*  --  19*  --   --  19*  --   --   --  22  --   GLUCOSE 119* 114* 93  --  112*  --  103*  --   --  151*  --   --   --  99  --   BUN 20 26* 26*  --  22*  --  24*  --   --  17  --   --   --  24*  --   CREATININE 0.46 0.57 0.63  --  0.55  --  0.61  --   --  0.60  --   --   --  0.89  --   CALCIUM  8.4* 8.0* 7.9*  --  8.1*  --  8.3*  --   --  8.1*  --   --   --  7.9*  --    < > = values in this interval not displayed.   CBC Recent Labs  Lab 10/31/23 0451 11/02/23 0600 11/03/23 0451 11/04/23 0241  WBC 9.7 10.5  --  9.0  NEUTROABS 6.9  --   --   --   HGB 7.7* 6.9* 8.0* 8.3*  HCT 21.7* 19.0* 22.4* 23.6*  MCV 93.9 94.5  --  95.9  PLT  134* 123*  --  125*    Medications:     calcium  carbonate  1 tablet Oral TID WC   Chlorhexidine  Gluconate Cloth  6 each Topical Daily   feeding supplement  237 mL Oral TID BM   folic acid   1 mg Oral Daily   furosemide  40 mg Intravenous BID   gabapentin   300 mg Oral TID   lactulose  30 g Oral TID   lidocaine   2  patch Transdermal Q24H   midodrine   20 mg Oral TID WC   multivitamin with minerals  1 tablet Oral Daily   nicotine   21 mg Transdermal Daily   nutrition supplement (JUVEN)  1 packet Oral BID BM   octreotide   100 mcg Subcutaneous Q8H   pantoprazole   40 mg Oral BID   rifaximin  550 mg Oral BID   sodium chloride   1 g Oral BID WC   sucralfate   1 g Oral Q6H   thiamine   100 mg Oral Daily

## 2023-11-06 NOTE — Progress Notes (Signed)
 Occupational Therapy Treatment Patient Details Name: Joan Wood MRN: [id number] DOB: [birth year] Today's Date: 11/06/2023   History of present illness 53 y.o. female presents to [facility] hospital on [facility]/2023 with nausea/vomiting and bloody diarrhea. Pt underwent upper EGD on 9/25, concerning for possible acute esophageal necrosis. Pt also underwent paracentesis on 9/25 and 10/6. PMH includes decompensated cirrhosis, PUD, GERD, pancreatitis, OSA.   OT comments  Pt received in supine, asleep, but arouses easily. Agreeable for OT visit with encouragement. Initially pt stating she wanted to go back to sleep first, re-oriented pt that it was supper time and encouraged OOB mobility/engagement in therapy. Pt remains tangential, verbose, and with STM and attention/reasoning cognitive deficits. Such evidenced by pt asking OT where she was going immediately after requesting OT ask RN for nausea meds. Functionally, she needed min A for LB dressing, min A for simple grooming at EOB, and setup for self-feeding. Briefly discussed puzzling as this is a leisure activity pt enjoys. Session ceased prematurely 2/2 persistent nausea. RN updated. AMPAC 16/24 indicating reduced functional performance, OT to continue to follow and progress as able.      If plan is discharge home, recommend the following:  Direct supervision/assist for medications management;Direct supervision/assist for financial management;Two people to help with walking and/or transfers;Two people to help with bathing/dressing/bathroom;Supervision due to cognitive status   Equipment Recommendations  BSC/3in1    Recommendations for Other Services      Precautions / Restrictions Precautions Precautions: Fall Recall of Precautions/Restrictions: Impaired Precaution/Restrictions Comments: orthostatic Restrictions Weight Bearing Restrictions Per Provider Order: No       Mobility Bed Mobility Overal bed mobility: Needs Assistance Bed Mobility:  Supine to Sit, Sit to Supine     Supine to sit: Contact guard Sit to supine: Contact guard assist   General bed mobility comments: Inc effort/time to complete for LE mgmt towards EOB, HOB elevated and use of bed rails to assist    Transfers                   General transfer comment: not assessed 2/2 nausea and pt reported feeling tired     Balance Overall balance assessment: Needs assistance Sitting-balance support: No upper extremity supported, Feet supported Sitting balance-Leahy Scale: Fair Sitting balance - Comments: seated EOB without overt LOB                                   ADL either performed or assessed with clinical judgement   ADL Overall ADL's : Needs assistance/impaired Eating/Feeding: Set up;Sitting Eating/Feeding Details (indicate cue type and reason): brings cup of diet soda to mouth without spillage Grooming: Brushing hair;Minimal assistance;Sitting Grooming Details (indicate cue type and reason): only initiating combing front pieces of hair, reports she will finish the rest in her private time             Lower Body Dressing: Contact guard assist;Sitting/lateral leans Lower Body Dressing Details (indicate cue type and reason): adjusting B socks via modified figure four technique             Functional mobility during ADLs: Contact guard assist (seated)      Extremity/Trunk Assessment Upper Extremity Assessment Upper Extremity Assessment: Generalized weakness (general deconditioning)            Vision       Perception     Praxis     Communication Communication Communication: No apparent difficulties Factors  Affecting Communication:  (verbose)   Cognition Arousal: Alert Behavior During Therapy: WFL for tasks assessed/performed Cognition: Cognition impaired   Orientation impairments: Situation (vague answer for situation/reason for hospitalization) Awareness: Intellectual awareness impaired Memory  impairment (select all impairments): Short-term memory, Working memory Attention impairment (select first level of impairment): Sustained attention Executive functioning impairment (select all impairments): Organization, Reasoning, Problem solving, Initiation OT - Cognition Comments: poor STM recall, attention, and reasoning/understanding as evidenced by pt asking OT where she was going immediately after pt asked OT to request nausea meds from RN                 Following commands: Impaired Following commands impaired: Follows multi-step commands inconsistently, Follows one step commands with increased time      Cueing   Cueing Techniques: Verbal cues, Tactile cues  Exercises      Shoulder Instructions       General Comments scattered abrasions to B UE    Pertinent Vitals/ Pain       Pain Assessment Pain Assessment: Faces Faces Pain Scale: Hurts little more Pain Location: generalized, back Pain Descriptors / Indicators: Discomfort Pain Intervention(s): Monitored during session  Home Living                                          Prior Functioning/Environment              Frequency  Min 2X/week        Progress Toward Goals  OT Goals(current goals can now be found in the care plan section)  Progress towards OT goals: Progressing toward goals (slowly)  Acute Rehab OT Goals Time For Goal Achievement: 11/20/23  Plan      Co-evaluation                 AM-PAC OT 6 Clicks Daily Activity     Outcome Measure   Help from another person eating meals?: None Help from another person taking care of personal grooming?: A Little Help from another person toileting, which includes using toliet, bedpan, or urinal?: A Lot Help from another person bathing (including washing, rinsing, drying)?: A Lot Help from another person to put on and taking off regular upper body clothing?: A Lot Help from another person to put on and taking off regular  lower body clothing?: A Little 6 Click Score: 16    End of Session    OT Visit Diagnosis: Other abnormalities of gait and mobility (R26.89);Muscle weakness (generalized) (M62.81);Pain;Other symptoms and signs involving cognitive function Pain - part of body:  (back)   Activity Tolerance No increased pain (nausea)   Patient Left in bed;with call bell/phone within reach;with bed alarm set   Nurse Communication Other (comment) (pt requesting nausea meds)        Time: 8375-8359 OT Time Calculation (min): 16 min  Charges: OT General Charges $OT Visit: 1 Visit OT Treatments $Self Care/Home Management : 8-22 mins  Konstantinos Cordoba D., MSOT, OTR/L Acute Rehabilitation Services [phone] Secure Chat Preferred  Rikki Milch 11/06/2023, 5:23 PM

## 2023-11-06 NOTE — Progress Notes (Signed)
 PROGRESS NOTE    FABIENNE NOLASCO  FMW:[id number] DOB: 17-Dec-1970 DOA: 10/03/2023 PCP: Edman Meade PEDLAR, FNP   Brief Narrative: Joan Wood is a 53 y.o. female with a history of decompensated liver cirrhosis, PUD, gastric ulcer perforation status post ex lap, GERD, pancreatitis, obstructive sleep apnea.  Patient presented secondary to nausea, coffee-ground emesis, bloody diarrhea with concern for upper GI bleed.  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  Hospitalization complicated by hepatic encephalopathy treated with lactulose and rifaximin.  Patient also requiring recurrent paracenteses for her recurrent ascites.  Discharge complicated by safe discharge concerns.   Assessment and Plan:  Decompensated alcoholic cirrhosis with ascites Patient treated empirically with Ceftriaxone  for possible SBP, although ascites fluid cell count not consistent with diagnosis. Palliative care consulted for ongoing goals of care discussions. -Continue to treat hepatic encephalopathy -Continue paracenteses as needed for symptoms; repeat ordered  Hepatic encephalopathy Improved with lactulose and Rifaximin. Patient's insurance will not cover Rifaximin on discharge. -Continue lactulose -When plan for discharge developed, will trial off Rifaximin  Transient alteration of awareness Patient developed significant change in mental status and was difficult to arose. PCCM was consulted for concern that patient would continue to decompensate. Patient spontaneously improved about 30 minutes later. Unclear etiology. She does not appear to have any post-ictal type presentation. She had an elevated temperature; no leukocytosis. Also complicated by patient's hyponatremia. Blood cultures with no growth to date.  Hypoalbuminemia Secondary to underlying liver  disease.  Thrombocytopenia Secondary to underlying liver disease.   AKI Presumed secondary to ATN from contrast. Nephrology was consulted for management. Renal function improved. Patient is not an HD candidate.  Appendicitis General surgery consulted. Patient high-risk for surgical management with recommendation for conservative management. Patient completed antibiotic regimen.  Chronic hypotension Worse this morning. Associated drop in hemoglobin, which may be etiology. Patient has received albumin  IV intermittently. -Continue midodrine  15 mg TID  Chronic hyponatremia Patient's baseline appears to be around 125 -128.  Sodium has significantly worsened. Nephrology consulted on 10/24. Lasix started with initial improvement, however sodium is back down to 118 today. -Continue fluid restriction (1,200 mL) -Nephrology recommendations (10/27): Lasix IV BID and salt tabs 1 g BID  Acute upper GI bleed Acute esophageal necrosis Acute blood loss anemia Patient seen and evaluated by gastroenterology. Upper GI endoscopy performed on 9/25 with evidence of grade D esophagitis in addition to concern for acute esophageal necrosis. Non-bleeding gastric ulcer noted with sutures. For esophageal necrosis, GI recommended carafate  for two weeks in addition to Protonix  BID and outpatient GI follow-up. Patient with recurrent anemia on 10/24 without obvious hemorrhaging noted; hemoglobin down to 6.9. Patient has received a total of 2 units of PRBC. Hemoglobin up to 8.0 post-transfusion. Sable.  Abdominal pain Presumed secondary to abdominal distension from ascites. Recurrent. Last paracentesis on 10/27.  Lower extremity edema VTE ruled out. Likely related to hypoalbuminemia from liver disease.  Hypervolemic hyponatremia Sodium is stable.  Hypomagnesemia Improved with supplementation  Hypokalemia Resolved with potassium supplementation.  Tobacco abuse -Continue nicotine  patch  Alcohol  abuse Noted.  Severe malnutrition -Dietitian recommendations (10/21): Continue current diet as ordered Encourage PO intake Continue ordering assistance Continue Ensure Plus High Protein po TID, each supplement provides 350 kcal and 20 grams of protein. 1 packet Juven BID, each packet provides 95 calories, 2.5 grams of protein (collagen),  to support wound healing Continue  MVI with minerals daily, thiamine  and folic acid  for alcohol use history  Goals of care Patient has been seen by palliative care and continues to desire full scope medical care. She uses phrases like, I am going to beat this. She is hoping to get [facility] to [facility] since it is closer to family. Palliative care plan for a family meeting on 10/29.  Pressure injury Mid sacrum. Unclear if present on admission.   DVT prophylaxis: SCDs Code Status:   Code Status: Full Code Family Communication: None at bedside. Disposition Plan: Discharge pending safe discharge plan and stable sodium   Consultants:  Palliative care medicine Gastroenterology  Procedures:    Antimicrobials:     Subjective: No specific concerns this morning. Continued abdominal pain. She reports being excited that her family in [place] want her closer for medical management.  Objective: BP (!) 95/56 (BP Location: Left Arm)   Pulse 81   Temp 98.7 F (37.1 C) (Oral)   Resp 10   Ht 5' 2 (1.575 m)   Wt 55.7 kg   SpO2 100%   BMI 22.46 kg/m   Examination:  General exam: Appears calm and comfortable. Respiratory system: Clear to auscultation. Respiratory effort normal. Cardiovascular system: S1 & S2 heard, RRR. No murmur. Gastrointestinal system: Abdomen is distended, soft and tender. Central nervous system: Alert and oriented. Musculoskeletal: No edema. No calf tenderness   Data Reviewed: I have personally reviewed following labs and imaging studies  CBC Lab Results  Component Value Date   WBC 9.0 11/04/2023   RBC 2.46 (L)  11/04/2023   HGB 8.3 (L) 11/04/2023   HCT 23.6 (L) 11/04/2023   MCV 95.9 11/04/2023   MCH 33.7 11/04/2023   PLT 125 (L) 11/04/2023   MCHC 35.2 11/04/2023   RDW 18.8 (H) 11/04/2023   LYMPHSABS 1.3 10/31/2023   MONOABS 1.3 (H) 10/31/2023   EOSABS 0.1 10/31/2023   BASOSABS 0.0 10/31/2023     Last metabolic panel Lab Results  Component Value Date   NA 125 (L) 11/06/2023   K 3.3 (L) 11/06/2023   CL 92 (L) 11/06/2023   CO2 22 11/06/2023   BUN 24 (H) 11/06/2023   CREATININE 0.89 11/06/2023   GLUCOSE 99 11/06/2023   GFRNONAA >60 11/06/2023   CALCIUM  7.9 (L) 11/06/2023   PHOS 2.6 10/17/2023   PROT 6.5 11/04/2023   ALBUMIN  2.6 (L) 11/04/2023   LABGLOB 4.4 07/30/2023   AGRATIO 1.2 03/28/2022   BILITOT 4.5 (H) 11/04/2023   ALKPHOS 63 11/04/2023   AST 115 (H) 11/04/2023   ALT 30 11/04/2023   ANIONGAP 8 11/06/2023    GFR: Estimated Creatinine Clearance: 57.2 mL/min (by C-G formula based on SCr of 0.89 mg/dL).  Recent Results (from the past 240 hours)  Culture, blood (Routine X 2) w Reflex to ID Panel     Status: None (Preliminary result)   Collection Time: 11/02/23  3:20 PM   Specimen: BLOOD RIGHT ARM  Result Value Ref Range Status   Specimen Description BLOOD RIGHT ARM  Final   Special Requests   Final    BOTTLES DRAWN AEROBIC AND ANAEROBIC Blood Culture results may not be optimal due to an inadequate volume of blood received in culture bottles   Culture   Final    NO GROWTH 4 DAYS Performed at [facility], [address], [place], [name] [address]    Report Status PENDING  Incomplete  Culture, blood (Routine X 2) w Reflex to ID Panel  Status: None (Preliminary result)   Collection Time: 11/02/23  5:00 PM   Specimen: BLOOD  Result Value Ref Range Status   Specimen Description BLOOD BLOOD RIGHT ARM  Final   Special Requests   Final    BOTTLES DRAWN AEROBIC AND ANAEROBIC Blood Culture results may not be optimal due to an inadequate volume of blood received in  culture bottles   Culture   Final    NO GROWTH 4 DAYS Performed at [facility], [address] [place], [name] [address]    Report Status PENDING  Incomplete       Radiology Studies: IR Paracentesis Result Date: 11/05/2023 INDICATION: 53 year old female. History decompensated cirrhosis with recurrent ascites. Request for therapeutic paracentesis. EXAM: ULTRASOUND GUIDED THERAPEUTIC RIGHT-SIDED PARACENTESIS MEDICATIONS: Lidocaine  1% 10 mL COMPLICATIONS: None immediate. PROCEDURE: Informed written consent was obtained from the patient after a discussion of the risks, benefits and alternatives to treatment. A timeout was performed prior to the initiation of the procedure. Initial ultrasound scanning demonstrates a small amount of ascites within the right lower abdominal quadrant. The right lower abdomen was prepped and draped in the usual sterile fashion. 1% lidocaine  was used for local anesthesia. Following this, a 19 gauge, 7-cm, Yueh catheter was introduced. An ultrasound image was saved for documentation purposes. The paracentesis was performed. The catheter was removed and a dressing was applied. The patient tolerated the procedure well without immediate post procedural complication. FINDINGS: A total of approximately 1.6 L of straw-colored fluid was removed. IMPRESSION: Successful ultrasound-guided paracentesis yielding 1.6 liters of straw-colored peritoneal fluid. Performed by Delon Beagle NP PLAN: The patient has required >/=2 paracenteses in a 30 day period and a formal evaluation by the [facility] Inter[facility] has been arranged. Electronically Signed   By: Juliene Balder M.D.   On: 11/05/2023 12:08       LOS: 34 days    Elgin Lam, MD [facility] 11/06/2023, 12:57 PM   If 7PM-7AM, please contact night-coverage [email or web address]

## 2023-11-06 NOTE — Progress Notes (Signed)
 Patient ID: Joan Wood, female   DOB: Jul 10, 1970, 53 y.o.   MRN: [id number]    Progress Note from the Palliative Medicine Team at [facility]   Patient Name: Joan Wood        Date: 11/06/2023 DOB: March 06, 1970  Age: 53 y.o. MRN#: [id number] Attending Physician: Briana Elgin LABOR, MD Primary Care Physician: Edman Meade PEDLAR, FNP Admit Date: 10/03/2023   Reason for Consultation/Follow-up   Establishing Goals of Care   HPI/ Brief Hospital Review  53 y.o. female  with past medical history of decompensated liver cirrhosis, PUD, gastric ulcer perforation status post ex lap, GERD, pancreatitis, obstructive sleep apnea.  Patient presented secondary to nausea, coffee-ground emesis, bloody diarrhea with concern for upper GI bleed.  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  Hospitalization complicated by hepatic encephalopathy treated with lactulose and rifaximin.  Patient also requiring recurrent paracenteses for her recurrent ascites.  Discharge complicated by safe discharge concerns, basically patient is homeless  Patient faces ongoing treatment option decisions, advanced directive decisions and anticipatory care needs.   Subjective  Extensive chart review has been completed prior to meeting with patient/family  including labs, vital signs, imaging, progress/consult notes, orders, medications and available advance directive documents.    This NP assessed patient at the bedside as a follow up for Palliative medicine needs and emotional support. Patient is pleasant and engages easily in conversation with me.  Education offered on the complexity of her current medical situation specifically as it relates to to need for housing and personal care needs.   Again a   discussion was had today regarding advanced directives.  Concepts specific to  code status, artifical feeding and hydration, continued IV antibiotics and rehospitalization was had.     Although patient acknowledges her medical situation, I continue to worry about her insight into the seriousness of her condition, long-term prognosis and her anticipatory care needs.  She tells me her children want her closer to them in the [place] area and it  feels good to feel wanted.   Today she tells me she would like that too.       Education offered today regarding  the importance of continued conversation with family and the  medical providers regarding overall plan of care and treatment options,  ensuring decisions are within the context of the patients values and GOCs   I was able to speak to [name] by telephone.    Continued education regarding medical and social situation, advanced care planning and anticipatory care needs.    He is unable to meet me in person tomorrow 53 y.o. to illness, but he and his sister will speak with me by conference call with their mother in her room tomorrow at 10:30  Patient's children ultimately hope that patient can discharge closer to where they live in the [place] area whether that be a SNF for rehabilitation , long-term care,  assisted living or rehab.  They cannot care for her in their homes.    Questions and concerns addressed     This is a difficult situation that we will need ongoing discussion and support as they  face pending medical and psychosocial issues.  Discussed with attending and TOC team via secure chat  Time: 50  minutes  Detailed review of medical records ( labs, imaging, vital signs), medically appropriate exam ( MS, skin, cardiac,  resp)  discussed with treatment team, counseling and education to patient, family, staff, documenting clinical information, medication management, coordination of care    Ronal Plants NP  Palliative Medicine Team Team Phone # [phone] Pager [phone]

## 2023-11-07 DIAGNOSIS — Z515 Encounter for palliative care: Secondary | ICD-10-CM | POA: Diagnosis not present

## 2023-11-07 DIAGNOSIS — K7460 Unspecified cirrhosis of liver: Secondary | ICD-10-CM | POA: Diagnosis not present

## 2023-11-07 DIAGNOSIS — D62 Acute posthemorrhagic anemia: Secondary | ICD-10-CM | POA: Diagnosis not present

## 2023-11-07 DIAGNOSIS — E871 Hypo-osmolality and hyponatremia: Secondary | ICD-10-CM | POA: Diagnosis not present

## 2023-11-07 DIAGNOSIS — R197 Diarrhea, unspecified: Secondary | ICD-10-CM | POA: Diagnosis not present

## 2023-11-07 DIAGNOSIS — Z7189 Other specified counseling: Secondary | ICD-10-CM | POA: Diagnosis not present

## 2023-11-07 DIAGNOSIS — I959 Hypotension, unspecified: Secondary | ICD-10-CM | POA: Diagnosis not present

## 2023-11-07 DIAGNOSIS — N179 Acute kidney failure, unspecified: Secondary | ICD-10-CM | POA: Diagnosis not present

## 2023-11-07 DIAGNOSIS — F1090 Alcohol use, unspecified, uncomplicated: Secondary | ICD-10-CM | POA: Diagnosis not present

## 2023-11-07 DIAGNOSIS — R112 Nausea with vomiting, unspecified: Secondary | ICD-10-CM | POA: Diagnosis not present

## 2023-11-07 DIAGNOSIS — K7290 Hepatic failure, unspecified without coma: Secondary | ICD-10-CM | POA: Diagnosis not present

## 2023-11-07 LAB — PROTIME-INR
INR: 2 — ABNORMAL HIGH (ref 0.8–1.2)
Prothrombin Time: 23.6 s — ABNORMAL HIGH (ref 11.4–15.2)

## 2023-11-07 LAB — SODIUM
Sodium: 125 mmol/L — ABNORMAL LOW (ref 135–145)
Sodium: 126 mmol/L — ABNORMAL LOW (ref 135–145)
Sodium: 129 mmol/L — ABNORMAL LOW (ref 135–145)
Sodium: 130 mmol/L — ABNORMAL LOW (ref 135–145)

## 2023-11-07 LAB — COMPREHENSIVE METABOLIC PANEL WITH GFR
ALT: 31 U/L (ref 0–44)
AST: 94 U/L — ABNORMAL HIGH (ref 15–41)
Albumin: 2.4 g/dL — ABNORMAL LOW (ref 3.5–5.0)
Alkaline Phosphatase: 54 U/L (ref 38–126)
Anion gap: 14 (ref 5–15)
BUN: 21 mg/dL — ABNORMAL HIGH (ref 6–20)
CO2: 21 mmol/L — ABNORMAL LOW (ref 22–32)
Calcium: 8 mg/dL — ABNORMAL LOW (ref 8.9–10.3)
Chloride: 90 mmol/L — ABNORMAL LOW (ref 98–111)
Creatinine, Ser: 0.61 mg/dL (ref 0.44–1.00)
GFR, Estimated: >60 mL/min
Glucose, Bld: 120 mg/dL — ABNORMAL HIGH (ref 70–99)
Potassium: 3.3 mmol/L — ABNORMAL LOW (ref 3.5–5.1)
Sodium: 125 mmol/L — ABNORMAL LOW (ref 135–145)
Total Bilirubin: 3.4 mg/dL — ABNORMAL HIGH (ref 0.0–1.2)
Total Protein: 6.5 g/dL (ref 6.5–8.1)

## 2023-11-07 LAB — MAGNESIUM: Magnesium: 1.4 mg/dL — ABNORMAL LOW (ref 1.7–2.4)

## 2023-11-07 LAB — BASIC METABOLIC PANEL WITH GFR
Anion gap: 7 (ref 5–15)
BUN: 27 mg/dL — ABNORMAL HIGH (ref 6–20)
CO2: 23 mmol/L (ref 22–32)
Calcium: 8.2 mg/dL — ABNORMAL LOW (ref 8.9–10.3)
Chloride: 92 mmol/L — ABNORMAL LOW (ref 98–111)
Creatinine, Ser: 0.6 mg/dL (ref 0.44–1.00)
GFR, Estimated: >60 mL/min (ref >60)
Glucose, Bld: 124 mg/dL — ABNORMAL HIGH (ref 70–99)
Potassium: 2.7 mmol/L — CL (ref 3.5–5.1)
Sodium: 122 mmol/L — ABNORMAL LOW (ref 135–145)

## 2023-11-07 LAB — CULTURE, BLOOD (ROUTINE X 2)
Culture: NO GROWTH
Culture: NO GROWTH

## 2023-11-07 MED ORDER — MAGNESIUM SULFATE 4 GM/100ML IV SOLN
4.0000 g | Freq: Once | INTRAVENOUS | Status: AC
  Administered 2023-11-07: 4 g via INTRAVENOUS
  Filled 2023-11-07: qty 100

## 2023-11-07 MED ORDER — POTASSIUM CHLORIDE CRYS ER 20 MEQ PO TBCR
40.0000 meq | EXTENDED_RELEASE_TABLET | ORAL | Status: AC
  Administered 2023-11-07 (×2): 40 meq via ORAL
  Filled 2023-11-07 (×2): qty 2

## 2023-11-07 MED ORDER — PROCHLORPERAZINE EDISYLATE 10 MG/2ML IJ SOLN
10.0000 mg | Freq: Once | INTRAMUSCULAR | Status: AC
  Administered 2023-11-07: 10 mg via INTRAVENOUS
  Filled 2023-11-07: qty 2

## 2023-11-07 MED ORDER — POTASSIUM CHLORIDE 10 MEQ/100ML IV SOLN
10.0000 meq | INTRAVENOUS | Status: AC
  Administered 2023-11-07 (×2): 10 meq via INTRAVENOUS
  Filled 2023-11-07 (×2): qty 100

## 2023-11-07 NOTE — TOC Progression Note (Signed)
 Transition of Care [facility]) - Progression Note    Patient Details  Name: Joan Wood MRN: [id number] Date of Birth: Aug 23, 1970  Transition of Care [facility]) CM/SW Contact  Andrez JULIANNA George, RN Phone Number: 11/07/2023, 3:42 PM  Clinical Narrative:     CM had meeting with pt, pts son via phone and palliative care. Decision was made to find a SNF in the [place] area.  LCSW aware.  IP Care management following.  Expected Discharge Plan:  (TBD) Barriers to Discharge: Homeless with medical needs, Inadequate or no insurance, Continued Medical Work up, Unsafe home situation               Expected Discharge Plan and Services In-house Referral: Clinical Social Work Discharge Planning Services: CM Consult Post Acute Care Choice: Home Health Living arrangements for the past 2 months: Single Family Home                 DME Arranged: Walker rolling   Date DME Agency Contacted: 10/08/23   Representative spoke with at DME Agency: [name]             Social Drivers of Health (SDOH) Interventions SDOH Screenings   Food Insecurity: No Food Insecurity (10/03/2023)  Housing: Low Risk  (10/03/2023)  Transportation Needs: No Transportation Needs (10/03/2023)  Recent Concern: Transportation Needs - Unmet Transportation Needs (08/20/2023)  Utilities: Not At Risk (10/03/2023)  Depression (PHQ2-9): High Risk (07/30/2023)  Social Connections: Socially Isolated (10/03/2023)  Tobacco Use: High Risk (10/04/2023)    Readmission Risk Interventions    10/04/2023    9:02 AM 09/03/2023   12:31 PM 09/02/2023    9:01 AM  Readmission Risk Prevention Plan  Transportation Screening Complete Complete Complete  PCP or Specialist Appt within 3-5 Days   Complete  HRI or Home Care Consult   Complete  Social Work Consult for Recovery Care Planning/Counseling   Complete  Palliative Care Screening   Not Applicable  Medication Review Oceanographer) Complete Complete Complete  HRI or Home Care Consult  Complete Complete   SW Recovery Care/Counseling Consult Complete Complete   Palliative Care Screening Not Applicable Not Applicable   Skilled Nursing Facility Not Applicable Patient Refused

## 2023-11-07 NOTE — Progress Notes (Signed)
 [facility] Progress Note    Assessment/ Plan:   Joan Wood is an 53 y.o. 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 Reconsulted 10/24 for worsening hyponatremia (Na down to 118)  Severe hyponatremia:  In the setting of decompensated cirrhosis, hypervolemia. -acute on chronic. Asymptomatic currently. Poor prognostic indicator. Fairly stable at 122-124 range. -volume status currently: hypervolemic (abd distension) -una <30, uosm 394, sosm 266 -fluid restrict: decreased to 1.2L/day; she has been drinking a lot of fluids with poor solid intake. Reinforced this again with patient today  -lasix: started standing 40mg  IV bid and great UOP.  -salt tabs 1g BID (cautious with this given higher propensity to re-accumulate ascites) -if Na is downtrending despite aforementioned therapies, then will need 3%.    -not a candidate for tolvaptan given cirrhosis;   Repeat SNa 122; SIADH with nausea + lots of fluid intake. Will give compazine tonight.   Hypotension, chronic -increased midodrine  to 20mg  TID 10/24- continue -started octreotide  10/24-continue -BP low but stable  Hypokalemia -repletion -> also gave her a banana this am.    AKI:  Baseline Cr 0.4, rose to 0.6 on 10/4, 1.1 10/5, 1.4 10/6.  She had CT w contrast 10/2. BP chronically low 80/50s -90/50s already on midodrine .  UA last 10/1 wasn't a clean catch - repeat 10/6 - looks bland.   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 Will cont to monitor. Cr remains stable -Poor candidate for dialysis should need arise,  fortunately renal function improved. Would not recommend it given high mortality risk with dialysis and cirrhosis   Decompensated Cirrhosis:  alcohol in etiology.  Ok for  moderate volume paracentesis PRN + IV albumin  25g/L on cue to paracentesis.  -watch closely since starting salt tabs   Anemia:  ABLA in the setting of recent ulcers, severe esophagitis.  Transfuse as needed for hemoglobin.  Hemoglobin stable 8.3 today   Appendicitis:  surgery recommended abx, high risk for surgery and no plans to proceed to OR.  If need for reimaging arises please try to avoid contrast. Per primary  Poor prognosis per charting, palliative care following.    Subjective:   Patient seen and examined bedside. Nausea usually at night and is much better during the daytime, continues to be drinking a lot of fluid with only 4 bites of solids.  8 out of 10 abdominal pain.       Objective:   BP (!) 91/59 (BP Location: Left Arm)   Pulse 90   Temp 99.6 F (37.6 C) (Oral)   Resp 18   Ht 5' 2 (1.575 m)   Wt 56.1 kg   SpO2 99%   BMI 22.63 kg/m   Intake/Output Summary (Last 24 hours) at 11/07/2023 0809 Last data filed at 11/07/2023 0400 Gross per 24 hour  Intake 730 ml  Output 1800 ml  Net -1070 ml   Weight change:   Physical Exam: Gen: chronically ill appearing, NAD HEENT: incteric sclera CVS: RRR Resp: CTA B/L, no w/r/r/c, unlabored, normal wob, speaking in full sentences Abd: soft, distended with +fluid shift, RLQ tenderness on palpation Ext: no edema b/l Les Neuro: awake, alert GU: +foley  Imaging: IR Paracentesis Result Date: 11/05/2023 INDICATION: 53 year old female. History decompensated cirrhosis with recurrent ascites. Request for therapeutic paracentesis. EXAM:  ULTRASOUND GUIDED THERAPEUTIC RIGHT-SIDED PARACENTESIS MEDICATIONS: Lidocaine  1% 10 mL COMPLICATIONS: None immediate. PROCEDURE: Informed written consent was obtained from the patient after a discussion of the risks, benefits and alternatives to treatment. A timeout was performed prior to the initiation of the procedure. Initial ultrasound scanning demonstrates a small amount of ascites within the  right lower abdominal quadrant. The right lower abdomen was prepped and draped in the usual sterile fashion. 1% lidocaine  was used for local anesthesia. Following this, a 19 gauge, 7-cm, Yueh catheter was introduced. An ultrasound image was saved for documentation purposes. The paracentesis was performed. The catheter was removed and a dressing was applied. The patient tolerated the procedure well without immediate post procedural complication. FINDINGS: A total of approximately 1.6 L of straw-colored fluid was removed. IMPRESSION: Successful ultrasound-guided paracentesis yielding 1.6 liters of straw-colored peritoneal fluid. Performed by Delon Beagle NP PLAN: The patient has required >/=2 paracenteses in a 30 day period and a formal evaluation by the [facility] Interventional Radiology Portal Hypertension Clinic has been arranged. Electronically Signed   By: Juliene Balder M.D.   On: 11/05/2023 12:08    Labs: BMET Recent Labs  Lab 11/02/23 0119 11/02/23 0600 11/02/23 1520 11/03/23 0851 11/03/23 1113 11/04/23 0241 11/04/23 0723 11/05/23 1051 11/05/23 1532 11/05/23 2327 11/06/23 0824 11/06/23 1113 11/06/23 1531 11/06/23 1931 11/06/23 2318 11/07/23 0210  NA 119* 118*   < > 118*   < > 123*   < > 126*   < > 122* 122* 125* 125* 125* 124* 122*  K 5.2* 4.7  --  4.0  --  3.3*  --  3.6  --   --  3.3*  --   --   --   --  2.7*  CL 93* 92*  --  91*  --  93*  --  95*  --   --  92*  --   --   --   --  92*  CO2 17* 18*  --  18*  --  19*  --  19*  --   --  22  --   --   --   --  23  GLUCOSE 114* 93  --  112*  --  103*  --  151*  --   --  99  --   --   --   --  124*  BUN 26* 26*  --  22*  --  24*  --  17  --   --  24*  --   --   --   --  27*  CREATININE 0.57 0.63  --  0.55  --  0.61  --  0.60  --   --  0.89  --   --   --   --  0.60  CALCIUM  8.0* 7.9*  --  8.1*  --  8.3*  --  8.1*  --   --  7.9*  --   --   --   --  8.2*   < > = values in this interval not displayed.   CBC Recent Labs  Lab  11/02/23 0600 11/03/23 0451 11/04/23 0241  WBC 10.5  --  9.0  HGB 6.9* 8.0* 8.3*  HCT 19.0* 22.4* 23.6*  MCV 94.5  --  95.9  PLT 123*  --  125*    Medications:     calcium  carbonate  1 tablet Oral TID WC   Chlorhexidine  Gluconate Cloth  6 each Topical Daily   feeding supplement  237 mL Oral TID BM   folic acid   1 mg Oral Daily   furosemide  40 mg Intravenous BID   gabapentin   300 mg Oral TID   lactulose  30 g Oral TID   lidocaine   2 patch Transdermal Q24H   midodrine   20 mg Oral TID WC   multivitamin with minerals  1 tablet Oral Daily   nicotine   21 mg Transdermal Daily   nutrition supplement (JUVEN)  1 packet Oral BID BM   octreotide   100 mcg Subcutaneous Q8H   pantoprazole   40 mg Oral BID   potassium chloride   40 mEq Oral Q4H   rifaximin  550 mg Oral BID   sodium chloride   1 g Oral BID WC   sucralfate   1 g Oral Q6H   thiamine   100 mg Oral Daily

## 2023-11-07 NOTE — Progress Notes (Signed)
 Physical Therapy Treatment Patient Details Name: Joan Wood MRN: [id number] DOB: [birth year] Today's Date: 11/07/2023   History of Present Illness 53 y.o. female presents to [facility] hospital on 10/03/2023 with nausea/vomiting and bloody diarrhea. Pt underwent upper EGD on 9/25, concerning for possible acute esophageal necrosis. Pt also underwent paracentesis on 9/25 and 10/6. PMH includes decompensated cirrhosis, PUD, GERD, pancreatitis, OSA.    PT Comments  Pt received in supine and agreeable to session. Pt requires cues for sequencing and redirection due to easy distraction during session. Pt able to complete bed mobility and transfers with CGA-min A with increased time. Pt able to tolerate multiple standing trials, but demonstrates limited standing tolerance due to dizziness. Pt reports improved dizziness and requests a short gait trial, however pt takes a few steps forward and reports increased dizziness requiring assist to return to EOB. Pt reports improved dizziness during each seated rest break and BP readings listed below. Pt continues to benefit from PT services to progress toward functional mobility goals.   BP longsitting: 92/65 Sitting EOB: 88/64 Standing: 80/55 Sitting after 2nd standing trial: 105/64   If plan is discharge home, recommend the following: A lot of help with walking and/or transfers;A lot of help with bathing/dressing/bathroom;Assistance with cooking/housework;Assist for transportation;Help with stairs or ramp for entrance;Direct supervision/assist for medications management;Direct supervision/assist for financial management;Supervision due to cognitive status   Can travel by private vehicle     Yes  Equipment Recommendations  Hospital bed;BSC/3in1;Rolling walker (2 wheels)    Recommendations for Other Services       Precautions / Restrictions Precautions Precautions: Fall Recall of Precautions/Restrictions: Impaired Precaution/Restrictions Comments:  orthostatic, foley Restrictions Weight Bearing Restrictions Per Provider Order: No     Mobility  Bed Mobility Overal bed mobility: Needs Assistance Bed Mobility: Supine to Sit, Sit to Supine     Supine to sit: Contact guard, HOB elevated, Used rails Sit to supine: Min assist   General bed mobility comments: Min A for LE elevation back to EOB. Pt able to supine scoot to [facility] without assist    Transfers Overall transfer level: Needs assistance Equipment used: Rolling walker (2 wheels) Transfers: Sit to/from Stand, Bed to chair/wheelchair/BSC Sit to Stand: Contact guard assist           General transfer comment: increased time for rise and CGA for safety. Pt able to take a few lateral and forward steps with CGA for safety    Ambulation/Gait             Pre-gait activities: static stancing marches General Gait Details: unable due to dizziness   Stairs             Wheelchair Mobility     Tilt Bed    Modified Rankin (Stroke Patients Only)       Balance Overall balance assessment: Needs assistance Sitting-balance support: No upper extremity supported, Feet supported, Bilateral upper extremity supported Sitting balance-Leahy Scale: Fair Sitting balance - Comments: sitting EOB with CGA   Standing balance support: Bilateral upper extremity supported, During functional activity, Reliant on assistive device for balance Standing balance-Leahy Scale: Poor Standing balance comment: reliant on RW and up to [employer]                            Communication Communication Communication: No apparent difficulties  Cognition Arousal: Alert Behavior During Therapy: Lability (intermittently)   PT - Cognitive impairments: No family/caregiver present to determine baseline, Awareness, Memory,  Attention, Sequencing, Problem solving, Safety/Judgement, Initiation                         Following commands: Impaired Following commands impaired: Follows  multi-step commands inconsistently, Follows one step commands with increased time    Cueing Cueing Techniques: Verbal cues, Tactile cues  Exercises General Exercises - Lower Extremity Hip Flexion/Marching: AROM, Standing, Both, 5 reps    General Comments        Pertinent Vitals/Pain Pain Assessment Pain Assessment: Faces Faces Pain Scale: Hurts little more Pain Location: generalized, back Pain Descriptors / Indicators: Discomfort Pain Intervention(s): Monitored during session     PT Goals (current goals can now be found in the care plan section) Acute Rehab PT Goals Patient Stated Goal: to return to independence PT Goal Formulation: With patient Time For Goal Achievement: 11/16/23 Progress towards PT goals: Progressing toward goals    Frequency    Min 2X/week       AM-PAC PT 6 Clicks Mobility   Outcome Measure  Help needed turning from your back to your side while in a flat bed without using bedrails?: A Little Help needed moving from lying on your back to sitting on the side of a flat bed without using bedrails?: A Little Help needed moving to and from a bed to a chair (including a wheelchair)?: A Little Help needed standing up from a chair using your arms (e.g., wheelchair or bedside chair)?: A Little Help needed to walk in hospital room?: Total (>54ft) Help needed climbing 3-5 steps with a railing? : Total 6 Click Score: 14    End of Session Equipment Utilized During Treatment: Gait belt Activity Tolerance: Patient tolerated treatment well;Other (comment) (dizziness) Patient left: with call bell/phone within reach;in bed;with bed alarm set Nurse Communication: Mobility status PT Visit Diagnosis: Other abnormalities of gait and mobility (R26.89);Muscle weakness (generalized) (M62.81);Unsteadiness on feet (R26.81);Difficulty in walking, not elsewhere classified (R26.2)     Time: 8553-8465 PT Time Calculation (min) (ACUTE ONLY): 48 min  Charges:     $Therapeutic Exercise: 8-22 mins $Therapeutic Activity: 23-37 mins PT General Charges $$ ACUTE PT VISIT: 1 Visit                     Darryle George, PTA Acute Rehabilitation Services Secure Chat Preferred  [phone]    Darryle George 11/07/2023, 3:58 PM

## 2023-11-07 NOTE — Progress Notes (Addendum)
 Nutrition Follow-up  DOCUMENTATION CODES:   Severe malnutrition in context of chronic illness  INTERVENTION:  Add Magic Cup berry and choc BID. The patient might benefit from an appetite stimulant - will defer to provider. Continue electrolyte replacements for refeeding syndrome. Continue daily multivitamin PO, 100 mg thiamine  PO daily, and 1 mg folic acid  PO daily. Continue 2 g sodium diet with 1.2 L fluid restriction. Continue Ensure+HP TID. Each supplement provides 350 kcal and 20 grams of protein. Continue Juven BID for wound healing.   NUTRITION DIAGNOSIS:   Severe Malnutrition related to chronic illness (cirrhosis) as evidenced by severe fat depletion, severe muscle depletion - ongoing   GOAL:   Patient will meet greater than or equal to 90% of their needs - progressing   MONITOR:   PO intake, Supplement acceptance, Labs, I & O's  REASON FOR ASSESSMENT:   Follow-up for: Consult (low BMI) Assessment of nutrition requirement/status  ASSESSMENT:   Pt with hx of cirrhosis, perforated gastric ulcers s/p expl lap 07/2023, recent GIB, h/o pancreatitis, alcohol use disorder, and GERD presented to [facility] ED with N/V/D and poor PO intake. Workup concerning for GI bleed  9/24 - presented to [facility] ED 9/25 - EGD showed necrotic appearing severe esophagitis in the mid and distal esophagus. Severe portal gastropathy. One 2 cm gastric ulcer and multiple superficial duodenal ulcers with clean bases, transferred to [facility] MICU, paracentesis removed 10/4 - diagnostic paracentesis of bright yellow fluid removed 10/6 - paracentesis 4L removed 10/13 - Paracentesis 3.3L removed    Update: Severe hyponatremia persists with 125 Na. The patient is on salt tabs with 1.2 L fluid restriction. Visited the patient with RN at bedside. The patient states she does not have an appetite. She orders food and knows she needs to eat but when she looks at the food, she does not  feel like eating. She is drinking one Ensure+HP daily and tells me she drinks a little bit of diet coke. She likes the choc-flavored Ensure. We discussed Magic Cup and she was quite excited about it and wants them in berry and choc flavors. Encouraged the patient to replace other liquids with ONS and to attempt to eat more solid foods. The patient continues to exhibit possible refeeding syndrome with K and Mg still depleted. Palliative on board with ongoing GOC discussions. Safe discharge complicated by the patient's homelessness.  Scheduled Meds:  calcium  carbonate  1 tablet Oral TID WC   Chlorhexidine  Gluconate Cloth  6 each Topical Daily   feeding supplement  237 mL Oral TID BM   folic acid   1 mg Oral Daily   furosemide  40 mg Intravenous BID   gabapentin   300 mg Oral TID   lactulose  30 g Oral TID   lidocaine   2 patch Transdermal Q24H   midodrine   20 mg Oral TID WC   multivitamin with minerals  1 tablet Oral Daily   nicotine   21 mg Transdermal Daily   nutrition supplement (JUVEN)  1 packet Oral BID BM   octreotide   100 mcg Subcutaneous Q8H   pantoprazole   40 mg Oral BID   prochlorperazine  10 mg Intravenous Once   rifaximin  550 mg Oral BID   sodium chloride   1 g Oral BID WC   sucralfate   1 g Oral Q6H   thiamine   100 mg Oral Daily   Continuous Infusions:  albumin  human     potassium chloride  10 mEq (11/07/23 1053)  PRN Meds:.albumin  human, albuterol , alum & mag hydroxide-simeth, artificial tears, diphenhydrAMINE , Muscle Rub, ondansetron , mouth rinse, oxyCODONE , phenol, prochlorperazine, simethicone , witch hazel-glycerin  Labs:     Latest Ref Rng & Units 11/07/2023    9:19 AM 11/07/2023    2:10 AM 11/06/2023   11:18 PM  CMP  Glucose 70 - 99 mg/dL  875    BUN 6 - 20 mg/dL  27    Creatinine 9.55 - 1.00 mg/dL  9.39    Sodium 864 - 854 mmol/L 125  122  124   Potassium 3.5 - 5.1 mmol/L  2.7    Chloride 98 - 111 mmol/L  92    CO2 22 - 32 mmol/L  23    Calcium  8.9 - 10.3 mg/dL   8.2       I/O: -2 L since admit  Meal Intake: 0-80% the past week, mostly under 25%   NUTRITION - FOCUSED PHYSICAL EXAM:  Flowsheet Row Most Recent Value  Orbital Region Severe depletion  Upper Arm Region Severe depletion  Thoracic and Lumbar Region Severe depletion  Buccal Region Severe depletion  Temple Region Severe depletion  Clavicle Bone Region Moderate depletion  Clavicle and Acromion Bone Region Severe depletion  Scapular Bone Region Severe depletion  Dorsal Hand Severe depletion  Patellar Region Unable to assess  Anterior Thigh Region Unable to assess  Posterior Calf Region Severe depletion  Edema (RD Assessment) None  [ascites in abdomen]  Hair Reviewed  Eyes Reviewed  [jaundice]  Mouth Reviewed  Skin Reviewed  [jaundice]  Nails Reviewed  [broken]    Diet Order             Diet 2 gram sodium Room service appropriate? Yes with Assist; Fluid consistency: Thin; Fluid restriction: 1200 mL Fluid  Diet effective now                 E+HP TID  EDUCATION NEEDS:   Education needs have been addressed  Skin:  Skin Assessment: Skin Integrity Issues: Skin Integrity Issues:: Stage II Stage II: sacrum  Last BM:  10/29 type 6  Height:   Ht Readings from Last 1 Encounters:  10/04/23 5' 2 (1.575 m)    Weight:    Ideal Body Weight:  50 kg  BMI:  Body mass index is 22.63 kg/m.  Estimated Nutritional Needs:   Kcal:  1600-1800  Protein:  85-100  Fluid:  1.2 L fluid restricted noted    Leverne Ruth, MS, RDN, LDN [place] See AMION for contact information

## 2023-11-07 NOTE — Progress Notes (Signed)
 Patient ID: Joan Wood, female   DOB: 01-26-1970, 53 y.o.   MRN: [id number]    Progress Note from the Palliative Medicine Team at [name]   Patient Name: Joan Wood        Date: 11/07/2023 DOB: April 06, 1970  Age: 53 y.o. MRN#: [id number] Attending Physician: Cindy Garnette POUR, MD Primary Care Physician: Edman Meade PEDLAR, FNP Admit Date: 10/03/2023   Reason for Consultation/Follow-up   Establishing Goals of Care   HPI/ Brief Hospital Review  53 y.o. female  with past medical history of decompensated liver cirrhosis, PUD, gastric ulcer perforation status post ex lap, GERD, pancreatitis, obstructive sleep apnea.  Patient presented secondary to nausea, coffee-ground emesis, bloody diarrhea with concern for upper GI bleed.  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  Hospitalization complicated by hepatic encephalopathy treated with lactulose and rifaximin.  Patient also requiring recurrent paracenteses for her recurrent ascites.  Discharge complicated by safe discharge concerns, basically patient is homeless  Patient faces ongoing treatment option decisions, advanced directive decisions and anticipatory care needs.   Subjective  Extensive chart review has been completed prior to meeting with patient/family  including labs, vital signs, imaging, progress/consult notes, orders, medications and available advance directive documents.    This NP assessed patient at the bedside as a follow up for Palliative medicine needs and emotional support. Patient is pleasant and engages easily in conversation with me.  As scheduled I spoke to patient's son [name] by telephone in the patient's room for ongoing goals of care conversation  Education offered on the complexity of her current medical situation specifically to her diagnosis of end-stage  liver disease/cirrhosis and overall failure to thrive.  We discussed in detail the impact of her disease on her current personal care/nursing needs.  Both patient and her son verbalized hope that patient can find a transition of care closer to them in the [place] area, TOC present for today's conversation and will begin search process  Education offered on the difference between a full medical support path attempting to prolong life and a palliative comfort path allowing for natural death.   Education offered on hospice benefit; philosophy and eligibility for future needs  Again a   discussion was had today regarding advanced directives.  Concepts specific to code status, artifical feeding and hydration, continued IV antibiotics and rehospitalization was had.  MOST form introduced.    We explored the importance of advance care planning and naming of H POA.  Patient verbalizes she would like to document her daughter Duwaine as her main decision maker in the event that she cannot speak for herself.  I will place an order to spiritual care to assist with that  Education offered today regarding  the importance of continued conversation with family and the  medical providers regarding overall plan of care and treatment options,  ensuring decisions are within the context of the patients values and GOCs   Questions and concerns addressed     This is a difficult situation that we will need ongoing discussion and support as they  face pending medical and psychosocial issues.  Discussed with attending and TOC team  Time: 50  minutes  Detailed review of medical records ( labs, imaging, vital signs), medically appropriate exam ( MS, skin, cardiac,  resp)   discussed with treatment team, counseling and education to patient, family, staff, documenting clinical information,  medication management, coordination of care    Ronal Plants NP  Palliative Medicine Team Team Phone # [phone] Pager [phone]

## 2023-11-07 NOTE — TOC Progression Note (Signed)
 Transition of Care [facility]) - Progression Note    Patient Details  Name: Joan Wood MRN: [id number] Date of Birth: July 17, 1970  Transition of [facility] CM/SW Contact  [name], [name] Phone Number: 11/07/2023, 4:19 PM  Clinical Narrative:   CSW contacted patient's son, Dorn, to discuss where in [place] the patient's children were located to start the SNF search. Zip code of [phone] is preferred. CSW contacted the following facilities:  - [place]: Spoke with [facility] in Admissions, they are out of network - [facility]: Do not take outside admissions - [facility] [place]: Left a voicemail and received a call back from Admissions, they are out of network - [facility]: Waited on hold for Admissions and got hung up on, will try again later - [facility]: left a engineer, technical sales for [employer] in Admissions, awaiting call back - [facility]: Left a engineer, technical sales for [employer] in Admissions, awaiting call back    Expected Discharge Plan: Skilled Nursing Facility Barriers to Discharge: Homeless with medical needs, Inadequate or no insurance, Continued Medical Work up, Unsafe home situation               Expected Discharge Plan and Services In-house Referral: Clinical Social Work Discharge Planning Services: [employer] Consult Post Acute Care Choice: Home Health Living arrangements for the past 2 months: Single Family Home                 DME Arranged: Walker rolling   Date DME Agency Contacted: 10/08/23   Representative spoke with at DME Agency: [name]             Social Drivers of Health (SDOH) Interventions SDOH Screenings   Food Insecurity: No Food Insecurity (10/03/2023)  Housing: Low Risk  (10/03/2023)  Transportation Needs: No Transportation Needs (10/03/2023)  Recent Concern: Transportation Needs - Unmet Transportation Needs (08/20/2023)  Utilities: Not At Risk (10/03/2023)  Depression  (PHQ2-9): High Risk (07/30/2023)  Social Connections: Socially Isolated (10/03/2023)  Tobacco Use: High Risk (10/04/2023)    Readmission Risk Interventions    10/04/2023    9:02 AM 09/03/2023   12:31 PM 09/02/2023    9:01 AM  Readmission Risk Prevention Plan  Transportation Screening Complete Complete Complete  PCP or Specialist Appt within 3-5 Days   Complete  HRI or Home Care Consult   Complete  Social Work Consult for Recovery Care Planning/Counseling   Complete  Palliative Care Screening   Not Applicable  Medication Review [employer]) Complete Complete Complete  HRI or Home Care Consult Complete Complete   SW Recovery Care/Counseling Consult Complete Complete   Palliative Care Screening Not Applicable Not Applicable   Skilled Nursing Facility Not Applicable Patient Refused

## 2023-11-07 NOTE — Progress Notes (Signed)
 Progress Note   Patient: Joan Wood FMW:[id number] DOB: Feb 21, 1970 DOA: 10/03/2023     35 DOS: the patient was seen and examined on 11/07/2023   Brief hospital course: 53 y.o. female with a history of decompensated liver cirrhosis, PUD, gastric ulcer perforation status post ex lap, GERD, pancreatitis, obstructive sleep apnea. Patient presented secondary to nausea, coffee-ground emesis, bloody diarrhea with concern for upper GI bleed. 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 Hospitalization complicated by hepatic encephalopathy treated with lactulose and rifaximin. Patient also requiring recurrent paracenteses for her recurrent ascites.   Assessment and Plan: Decompensated alcoholic cirrhosis with ascites Patient treated empirically with Ceftriaxone  for possible SBP, although ascites fluid cell count not consistent with diagnosis. Palliative care consulted for ongoing goals of care discussions. -Continue to treat hepatic encephalopathy -Continue paracenteses as needed for symptoms. Currently asymptomatic   Hepatic encephalopathy Improved with lactulose and Rifaximin. Patient's insurance will not cover Rifaximin on discharge. -Continue lactulose -When plan for discharge developed, will trial off Rifaximin   Transient alteration of awareness Patient developed significant change in mental status and was difficult to arose. PCCM was consulted for concern that patient would continue to decompensate. Patient spontaneously improved about 30 minutes later. Unclear etiology. She does not appear to have any post-ictal type presentation. She had an elevated temperature; no leukocytosis. Also complicated by patient's hyponatremia. Blood cultures with no growth to date.   Hypoalbuminemia Secondary to underlying liver disease.    Thrombocytopenia Secondary to underlying liver disease.    AKI Presumed secondary to ATN from contrast. Nephrology was consulted for management. Renal function improved. Patient is not an HD candidate.   Appendicitis General surgery consulted. Patient high-risk for surgical management with recommendation for conservative management. Patient completed antibiotic regimen.   Chronic hypotension Worse this morning. Associated drop in hemoglobin, which may be etiology. Patient has received albumin  IV intermittently. -Continue midodrine  15 mg TID   Chronic hyponatremia Patient's baseline appears to be around 125 -128.  Sodium has significantly worsened. Nephrology consulted on 10/24. Lasix started with initial improvement, however sodium is back down to 118 today. -Continue fluid restriction (1,200 mL) -Nephrology recommendations (10/27): Lasix IV BID and salt tabs 1 g BID -Given nausea, trial of zofran  x 3 doses noted per Nephrology   Acute upper GI bleed Acute esophageal necrosis Acute blood loss anemia Patient seen and evaluated by gastroenterology. Upper GI endoscopy performed on 9/25 with evidence of grade D esophagitis in addition to concern for acute esophageal necrosis. Non-bleeding gastric ulcer noted with sutures. For esophageal necrosis, GI recommended carafate  for two weeks in addition to Protonix  BID and outpatient GI follow-up. Patient with recurrent anemia on 10/24 without obvious hemorrhaging noted; hemoglobin down to 6.9. Patient has received a total of 2 units of PRBC. Hemoglobin up to 8.0 post-transfusion. Sable.   Abdominal pain Presumed secondary to abdominal distension from ascites. Recurrent. Last paracentesis on 10/27.   Lower extremity edema VTE ruled out. Likely related to hypoalbuminemia from liver disease.   Hypervolemic hyponatremia Sodium is stable.   Hypomagnesemia Low. Replaced   Hypokalemia Remains low, replaced   Tobacco abuse -Continue nicotine   patch   Alcohol abuse Noted.   Severe malnutrition -Dietitian recommendations (10/21): Continue current diet as ordered Encourage PO intake Continue ordering assistance Continue Ensure Plus High Protein po TID, each supplement provides 350 kcal and 20 grams of protein. 1 packet Juven  BID, each packet provides 95 calories, 2.5 grams of protein (collagen),  to support wound healing Continue MVI with minerals daily, thiamine  and folic acid  for alcohol use history   Goals of care Goals noted for rehab at SNF. TOC consulted and following   Pressure injury Mid sacrum. Unclear if present on admission.         Subjective: Complained of burning at IV site while KCl IV was running  Physical Exam: Vitals:   11/07/23 0840 11/07/23 0840 11/07/23 1213 11/07/23 1600  BP: 94/60 94/60 119/77 96/60  Pulse: 94 94 78 89  Resp: 17 17 16 16   Temp: 97.8 F (36.6 C) 97.8 F (36.6 C) 98.2 F (36.8 C) 98.6 F (37 C)  TempSrc: Oral Oral Oral   SpO2: 100% 100% 100% 100%  Weight:      Height:       General exam: Awake, laying in bed, in nad Respiratory system: Normal respiratory effort, no wheezing Cardiovascular system: regular rate, s1, s2 Gastrointestinal system: Soft, nondistended, positive BS Central nervous system: CN2-12 grossly intact, strength intact Extremities: Perfused, no clubbing Skin: Normal skin turgor, no notable skin lesions seen Psychiatry: Mood normal // no visual hallucinations   Data Reviewed:  Labs reviewed: Na 125, K 3.3, Cr 0.61, Mg 1.4  Family Communication: Pt in room, family not at bedside  Disposition: Status is: Inpatient Remains inpatient appropriate because: severity of illness  Planned Discharge Destination: Skilled nursing facility    Author: Garnette Pelt, MD 11/07/2023 4:46 PM  For on call review [email or web address].

## 2023-11-07 NOTE — Plan of Care (Signed)
  Problem: Clinical Measurements: Goal: Will remain free from infection Outcome: Progressing Goal: Diagnostic test results will improve Outcome: Progressing Goal: Respiratory complications will improve Outcome: Progressing Goal: Cardiovascular complication will be avoided Outcome: Progressing   Problem: Activity: Goal: Risk for activity intolerance will decrease Outcome: Progressing   Problem: Nutrition: Goal: Adequate nutrition will be maintained Outcome: Progressing   Problem: Elimination: Goal: Will not experience complications related to bowel motility Outcome: Progressing Goal: Will not experience complications related to urinary retention Outcome: Progressing   Problem: Pain Managment: Goal: General experience of comfort will improve and/or be controlled Outcome: Progressing   Problem: Safety: Goal: Ability to remain free from injury will improve Outcome: Progressing   Problem: Skin Integrity: Goal: Risk for impaired skin integrity will decrease Outcome: Progressing

## 2023-11-08 DIAGNOSIS — D62 Acute posthemorrhagic anemia: Secondary | ICD-10-CM | POA: Diagnosis not present

## 2023-11-08 DIAGNOSIS — R197 Diarrhea, unspecified: Secondary | ICD-10-CM | POA: Diagnosis not present

## 2023-11-08 DIAGNOSIS — R112 Nausea with vomiting, unspecified: Secondary | ICD-10-CM | POA: Diagnosis not present

## 2023-11-08 DIAGNOSIS — N179 Acute kidney failure, unspecified: Secondary | ICD-10-CM | POA: Diagnosis not present

## 2023-11-08 DIAGNOSIS — K7290 Hepatic failure, unspecified without coma: Secondary | ICD-10-CM | POA: Diagnosis not present

## 2023-11-08 DIAGNOSIS — E871 Hypo-osmolality and hyponatremia: Secondary | ICD-10-CM | POA: Diagnosis not present

## 2023-11-08 DIAGNOSIS — I959 Hypotension, unspecified: Secondary | ICD-10-CM | POA: Diagnosis not present

## 2023-11-08 DIAGNOSIS — K7460 Unspecified cirrhosis of liver: Secondary | ICD-10-CM | POA: Diagnosis not present

## 2023-11-08 LAB — SODIUM
Sodium: 131 mmol/L — ABNORMAL LOW (ref 135–145)
Sodium: 129 mmol/L — ABNORMAL LOW (ref 135–145)
Sodium: 128 mmol/L — ABNORMAL LOW (ref 135–145)
Sodium: 129 mmol/L — ABNORMAL LOW (ref 135–145)
Sodium: 127 mmol/L — ABNORMAL LOW (ref 135–145)

## 2023-11-08 LAB — CBC
HCT: 20.3 % — ABNORMAL LOW (ref 36.0–46.0)
Hemoglobin: 7.1 g/dL — ABNORMAL LOW (ref 12.0–15.0)
MCH: 33.3 pg (ref 26.0–34.0)
MCHC: 35 g/dL (ref 30.0–36.0)
MCV: 95.3 fL (ref 80.0–100.0)
Platelets: 115 K/uL — ABNORMAL LOW (ref 150–400)
RBC: 2.13 MIL/uL — ABNORMAL LOW (ref 3.87–5.11)
RDW: 18.1 % — ABNORMAL HIGH (ref 11.5–15.5)
WBC: 7.8 K/uL (ref 4.0–10.5)
nRBC: 0 % (ref 0.0–0.2)

## 2023-11-08 LAB — COMPREHENSIVE METABOLIC PANEL WITH GFR
ALT: 32 U/L (ref 0–44)
AST: 105 U/L — ABNORMAL HIGH (ref 15–41)
Albumin: 2.3 g/dL — ABNORMAL LOW (ref 3.5–5.0)
Alkaline Phosphatase: 70 U/L (ref 38–126)
Anion gap: 11 (ref 5–15)
BUN: 21 mg/dL — ABNORMAL HIGH (ref 6–20)
CO2: 21 mmol/L — ABNORMAL LOW (ref 22–32)
Calcium: 7.9 mg/dL — ABNORMAL LOW (ref 8.9–10.3)
Chloride: 97 mmol/L — ABNORMAL LOW (ref 98–111)
Creatinine, Ser: 0.55 mg/dL (ref 0.44–1.00)
GFR, Estimated: >60 mL/min (ref >60)
Glucose, Bld: 118 mg/dL — ABNORMAL HIGH (ref 70–99)
Potassium: 3.5 mmol/L (ref 3.5–5.1)
Sodium: 129 mmol/L — ABNORMAL LOW (ref 135–145)
Total Bilirubin: 2.8 mg/dL — ABNORMAL HIGH (ref 0.0–1.2)
Total Protein: 6.4 g/dL — ABNORMAL LOW (ref 6.5–8.1)

## 2023-11-08 LAB — PROTIME-INR
INR: 1.9 — ABNORMAL HIGH (ref 0.8–1.2)
Prothrombin Time: 22.7 s — ABNORMAL HIGH (ref 11.4–15.2)

## 2023-11-08 LAB — MAGNESIUM: Magnesium: 2 mg/dL (ref 1.7–2.4)

## 2023-11-08 MED ORDER — POTASSIUM CHLORIDE 20 MEQ PO PACK
60.0000 meq | PACK | Freq: Once | ORAL | Status: AC
  Administered 2023-11-08: 60 meq via ORAL
  Filled 2023-11-08: qty 3

## 2023-11-08 NOTE — Plan of Care (Signed)
  Problem: Health Behavior/Discharge Planning: Goal: Ability to manage health-related needs will improve Outcome: Not Progressing   Problem: Clinical Measurements: Goal: Ability to maintain clinical measurements within normal limits will improve Outcome: Not Progressing Goal: Will remain free from infection Outcome: Not Progressing Goal: Diagnostic test results will improve Outcome: Not Progressing Goal: Respiratory complications will improve Outcome: Not Progressing Goal: Cardiovascular complication will be avoided Outcome: Not Progressing   Problem: Activity: Goal: Risk for activity intolerance will decrease Outcome: Not Progressing   Problem: Nutrition: Goal: Adequate nutrition will be maintained Outcome: Not Progressing   Problem: Safety: Goal: Ability to remain free from injury will improve Outcome: Not Progressing   Problem: Skin Integrity: Goal: Risk for impaired skin integrity will decrease Outcome: Not Progressing

## 2023-11-08 NOTE — Progress Notes (Signed)
 Progress Note   Patient: Joan Wood FMW:[id number] DOB: [birth year] DOA: 10/03/2023     36 DOS: the patient was seen and examined on 11/08/2023   Brief hospital course: 53 y.o. female with a history of decompensated liver cirrhosis, PUD, gastric ulcer perforation status post ex lap, GERD, pancreatitis, obstructive sleep apnea. Patient presented secondary to nausea, coffee-ground emesis, bloody diarrhea with concern for upper GI bleed. 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 Hospitalization complicated by hepatic encephalopathy treated with lactulose and rifaximin. Patient also requiring recurrent paracenteses for her recurrent ascites.   Assessment and Plan: Decompensated alcoholic cirrhosis with ascites Patient treated empirically with Ceftriaxone  for possible SBP, although ascites fluid cell count not consistent with diagnosis. Palliative care consulted for ongoing goals of care discussions. -Continue to treat hepatic encephalopathy -Continue paracenteses as needed for symptoms. Remains asymptomatic   Hepatic encephalopathy Improved with lactulose and Rifaximin. Patient's insurance will not cover Rifaximin on discharge. -Continue lactulose -When plan for discharge developed, will trial off Rifaximin   Transient alteration of awareness Patient developed significant change in mental status and was difficult to arose. PCCM was consulted for concern that patient would continue to decompensate. Patient spontaneously improved about 30 minutes later. Unclear etiology. She does not appear to have any post-ictal type presentation. She had an elevated temperature; no leukocytosis. Also complicated by patient's hyponatremia. Blood cultures with no growth to date.   Hypoalbuminemia Secondary to underlying liver disease.    Thrombocytopenia Secondary to underlying liver disease.    AKI Presumed secondary to ATN from contrast. Nephrology was consulted for management. Renal function improved. Patient is not an HD candidate.   Appendicitis General surgery consulted. Patient high-risk for surgical management with recommendation for conservative management. Patient completed antibiotic regimen.   Chronic hypotension Worse this morning. Associated drop in hemoglobin, which may be etiology. Patient has received albumin  IV intermittently. -Continue midodrine  15 mg TID   Chronic hyponatremia Patient's baseline appears to be around 125 -128.  Sodium has significantly worsened. Nephrology consulted on 10/24. Lasix started with initial improvement, however sodium is back down to 118 today. -Continue fluid restriction (1,200 mL) -Nephrology recommendations (10/27): Lasix IV BID and salt tabs 1 g BID -Na improved to 131. Main issue is drinking too much fluids per Nephro -Nephro has since signed off 10/30   Acute upper GI bleed Acute esophageal necrosis Acute blood loss anemia Patient seen and evaluated by gastroenterology. Upper GI endoscopy performed on 9/25 with evidence of grade D esophagitis in addition to concern for acute esophageal necrosis. Non-bleeding gastric ulcer noted with sutures. For esophageal necrosis, GI recommended carafate  for two weeks in addition to Protonix  BID and outpatient GI follow-up. Patient with recurrent anemia on 10/24 without obvious hemorrhaging noted; hemoglobin down to 6.9. Patient has received a total of 2 units of PRBC. Hemoglobin up to 8.0 post-transfusion. Sable.   Abdominal pain Presumed secondary to abdominal distension from ascites. Recurrent. Last paracentesis on 10/27.   Lower extremity edema VTE ruled out. Likely related to hypoalbuminemia from liver disease.   Hypervolemic hyponatremia Sodium is stable.   Hypomagnesemia Low. Replaced   Hypokalemia Remains low,  replaced   Tobacco abuse -Continue nicotine  patch   Alcohol abuse Noted.   Severe malnutrition -Dietitian recommendations (10/21): Continue current diet as ordered Encourage PO intake Continue ordering assistance Continue Ensure Plus High Protein po TID, each supplement provides 350 kcal  and 20 grams of protein. 1 packet Juven BID, each packet provides 95 calories, 2.5 grams of protein (collagen),  to support wound healing Continue MVI with minerals daily, thiamine  and folic acid  for alcohol use history   Goals of care Goals noted for rehab at SNF. TOC consulted and following   Pressure injury Mid sacrum. Unclear if present on admission.      Subjective: Without complaints. Not eating very much, dependent on ensures thus far. Asking for more fluids to drink  Physical Exam: Vitals:   11/08/23 0401 11/08/23 0801 11/08/23 1134 11/08/23 1557  BP:  102/61 (!) 106/59 (!) 95/58  Pulse:  84 92 88  Resp:  19 16 19   Temp:  98.5 F (36.9 C) (!) 97.4 F (36.3 C) 98.7 F (37.1 C)  TempSrc:   Oral Oral  SpO2:  100% 100% (!) 74%  Weight: 52.7 kg     Height:       General exam: Conversant, in no acute distress Respiratory system: normal chest rise, clear, no audible wheezing Cardiovascular system: regular rhythm, s1-s2 Gastrointestinal system: Nondistended, nontender, pos BS Central nervous system: No seizures, no tremors Extremities: No cyanosis, no joint deformities Skin: No rashes, no pallor Psychiatry: Affect normal // no auditory hallucinations   Data Reviewed:  Labs reviewed: Na 129, K 3.5, Cr 0.55, WBC 7.8, Hgb 7.1, Plts 115  Family Communication: Pt in room, family not at bedside  Disposition: Status is: Inpatient Remains inpatient appropriate because: severity of illness  Planned Discharge Destination: Skilled nursing facility    Author: Garnette Pelt, MD 11/08/2023 4:42 PM  For on call review [email or web address].

## 2023-11-08 NOTE — Progress Notes (Signed)
 [facility] Progress Note    Assessment/ Plan:   Joan Wood is an 53 y.o. 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 Reconsulted 10/24 for worsening hyponatremia (Na down to 118)  Severe hyponatremia:  In the setting of decompensated cirrhosis, hypervolemia. -acute on chronic. Asymptomatic currently. Poor prognostic indicator. Fairly stable at 122-124 range. -volume status currently: hypervolemic (abd distension) -una <30, uosm 394, sosm 266 -lasix: started standing 40mg  IV bid and great UOP.  -if Na is downtrending despite aforementioned therapies, then will need 3% but best to avoid.    -not a candidate for tolvaptan given cirrhosis  -fluid restrict: decreased to 1.2L/day; she had been drinking a lot of fluids with poor solid intake prior day and did better yest. Now SNa 131.  -salt tabs 1g BID -> should be able to stop. Her main issue is drinking too much fluids; problem with salt tabs is higher propensity to re-accumulate ascites  Signing off at this time; please reconsult as needed.   Hypotension, chronic -midodrine  20mg  TID 10/24- continue -started octreotide  10/24-continue -BP low but stable  Hypokalemia -repletion -> also gave her a banana this am.    AKI:  Baseline Cr 0.4, rose to 0.6 on 10/4, 1.1 10/5, 1.4 10/6.  She had CT w contrast 10/2. BP chronically low 80/50s -90/50s already on midodrine .  UA last 10/1 wasn't a clean catch - repeat 10/6 - looks bland.   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 Will cont to monitor. Cr remains stable -Poor candidate for dialysis should need arise,  fortunately renal function improved. Would not recommend it given high mortality risk with dialysis and cirrhosis    Decompensated Cirrhosis:  alcohol in etiology.  Ok for moderate volume paracentesis PRN + IV albumin  25g/L on cue to paracentesis.  -watch closely since starting salt tabs   Anemia:  ABLA in the setting of recent ulcers, severe esophagitis.  Transfuse as needed for hemoglobin.  Hemoglobin stable 8.3 today   Appendicitis:  surgery recommended abx, high risk for surgery and no plans to proceed to OR.  If need for reimaging arises please try to avoid contrast. Per primary  Poor prognosis per charting, palliative care following.    Subjective:   Patient seen and examined bedside. Nausea usually at night (better yest); more careful with fluids yest.    Objective:   BP (!) 93/58 (BP Location: Left Arm)   Pulse 95   Temp 99 F (37.2 C) (Oral)   Resp 19   Ht 5' 2 (1.575 m)   Wt 52.7 kg   SpO2 98%   BMI 21.25 kg/m   Intake/Output Summary (Last 24 hours) at 11/08/2023 0825 Last data filed at 11/07/2023 2110 Gross per 24 hour  Intake 1222.01 ml  Output 1850 ml  Net -627.99 ml   Weight change: -3.402 kg  Physical Exam: Gen: chronically ill appearing, NAD HEENT: incteric sclera CVS: RRR Resp: CTA B/L, no w/r/r/c, unlabored, normal wob, speaking in full sentences Abd: soft, distended with +fluid shift, RLQ tenderness on palpation Ext: no edema b/l Les Neuro: awake, alert GU: +foley  Imaging: No results found.   Labs: BMET Recent Labs  Lab 11/03/23 [phone] 11/03/23 1113 11/04/23 0241 11/04/23 0723 11/05/23 1051 11/05/23 1532 11/06/23 0824 11/06/23 1113 11/07/23  0210 11/07/23 0919 11/07/23 1255 11/07/23 1456 11/07/23 2009 11/07/23 2309 11/08/23 0154 11/08/23 0557  NA 118*   < > 123*   < > 126*   < > 122*   < > 122* 125* 126* 125* 129* 130* 129* 131*  K 4.0  --  3.3*  --  3.6  --  3.3*  --  2.7*  --   --  3.3*  --   --  3.5  --   CL 91*  --  93*  --  95*  --  92*  --  92*  --   --  90*  --   --  97*  --   CO2 18*  --  19*  --  19*  --  22  --  23  --   --  21*   --   --  21*  --   GLUCOSE 112*  --  103*  --  151*  --  99  --  124*  --   --  120*  --   --  118*  --   BUN 22*  --  24*  --  17  --  24*  --  27*  --   --  21*  --   --  21*  --   CREATININE 0.55  --  0.61  --  0.60  --  0.89  --  0.60  --   --  0.61  --   --  0.55  --   CALCIUM  8.1*  --  8.3*  --  8.1*  --  7.9*  --  8.2*  --   --  8.0*  --   --  7.9*  --    < > = values in this interval not displayed.   CBC Recent Labs  Lab 11/02/23 0600 11/03/23 0451 11/04/23 0241 11/08/23 0154  WBC 10.5  --  9.0 7.8  HGB 6.9* 8.0* 8.3* 7.1*  HCT 19.0* 22.4* 23.6* 20.3*  MCV 94.5  --  95.9 95.3  PLT 123*  --  125* 115*    Medications:     calcium  carbonate  1 tablet Oral TID WC   Chlorhexidine  Gluconate Cloth  6 each Topical Daily   feeding supplement  237 mL Oral TID BM   folic acid   1 mg Oral Daily   furosemide  40 mg Intravenous BID   gabapentin   300 mg Oral TID   lactulose  30 g Oral TID   lidocaine   2 patch Transdermal Q24H   midodrine   20 mg Oral TID WC   multivitamin with minerals  1 tablet Oral Daily   nicotine   21 mg Transdermal Daily   nutrition supplement (JUVEN)  1 packet Oral BID BM   octreotide   100 mcg Subcutaneous Q8H   pantoprazole   40 mg Oral BID   potassium chloride   60 mEq Oral Once   rifaximin  550 mg Oral BID   sodium chloride   1 g Oral BID WC   sucralfate   1 g Oral Q6H   thiamine   100 mg Oral Daily

## 2023-11-08 NOTE — Progress Notes (Signed)
 This chaplain responded to PMT [facility] consult for creating the Pt. Advance Directive-HCPOA. The Pt. is meeting with the medical time at the time of the visit.   The chaplain introduced herself to the Pt. The Pt. shares she prefers to eat lunch and review AD education on her own. The chaplain left the AD education with the Pt. and agreed to revisit at another time.  Chaplain [name] Hummer [phone]

## 2023-11-08 NOTE — Progress Notes (Signed)
 Occupational Therapy Treatment Patient Details Name: Joan Wood MRN: [id number] DOB: June 10, 1970 Today's Date: 11/08/2023   History of present illness 53 y.o. female presents to [facility] hospital on 10/03/2023 with nausea/vomiting and bloody diarrhea. Pt underwent upper EGD on 9/25, concerning for possible acute esophageal necrosis. Pt also underwent paracentesis on 9/25 and 10/6. PMH includes decompensated cirrhosis, PUD, GERD, pancreatitis, OSA.   OT comments  Patient received seated up in recliner upon entry and agreeable to OT treatment. Patient participated in BUE HEP with level one therapy band and instructions on exercises. Patient began having increased complaints of back and buttocks pain from sitting in recliner and asked to return to bed. Patient able to stand and ambulate to EOB with CGA and min assist to return to supine. Patient will benefit from continued inpatient follow up therapy, <3 hours/day.  Acute OT to continue to follow to address established goals to facilitate DC to next venue of care.        If plan is discharge home, recommend the following:  Direct supervision/assist for medications management;Direct supervision/assist for financial management;Two people to help with walking and/or transfers;Two people to help with bathing/dressing/bathroom;Supervision due to cognitive status   Equipment Recommendations  BSC/3in1    Recommendations for Other Services      Precautions / Restrictions Precautions Precautions: Fall Recall of Precautions/Restrictions: Impaired Precaution/Restrictions Comments: orthostatic, foley Restrictions Weight Bearing Restrictions Per Provider Order: No       Mobility Bed Mobility Overal bed mobility: Needs Assistance Bed Mobility: Sit to Supine       Sit to supine: Min assist   General bed mobility comments: min assist to scoot towards middle of bed    Transfers Overall transfer level: Needs assistance Equipment used: Rolling  walker (2 wheels) Transfers: Sit to/from Stand, Bed to chair/wheelchair/BSC Sit to Stand: Contact guard assist           General transfer comment: ambulated short distance from recliner to EOB with CGA to stand and for balance     Balance Overall balance assessment: Needs assistance Sitting-balance support: No upper extremity supported, Feet supported, Bilateral upper extremity supported Sitting balance-Leahy Scale: Fair Sitting balance - Comments: EOB   Standing balance support: Bilateral upper extremity supported, During functional activity, Reliant on assistive device for balance Standing balance-Leahy Scale: Poor Standing balance comment: reliant on UE support                           ADL either performed or assessed with clinical judgement   ADL Overall ADL's : Needs assistance/impaired Eating/Feeding: Set up;Sitting   Grooming: Wash/dry hands;Wash/dry face;Set up;Sitting                                      Extremity/Trunk Assessment              Vision       Perception     Praxis     Communication Communication Communication: No apparent difficulties   Cognition Arousal: Alert Behavior During Therapy: WFL for tasks assessed/performed Cognition: Cognition impaired   Orientation impairments: Situation Awareness: Intellectual awareness impaired Memory impairment (select all impairments): Short-term memory, Working memory Attention impairment (select first level of impairment): Sustained attention Executive functioning impairment (select all impairments): Organization, Reasoning, Problem solving, Initiation OT - Cognition Comments: requires reminders of fluid restrictions  Following commands: Impaired Following commands impaired: Follows multi-step commands inconsistently, Follows one step commands with increased time      Cueing   Cueing Techniques: Verbal cues, Tactile cues  Exercises Exercises:  General Upper Extremity General Exercises - Upper Extremity Shoulder Flexion: Strengthening, Both, 5 reps, Seated, Theraband Theraband Level (Shoulder Flexion): Level 1 (Yellow) Shoulder Horizontal ABduction: Strengthening, Both, 10 reps, Seated, Theraband Theraband Level (Shoulder Horizontal Abduction): Level 1 (Yellow) Elbow Extension: Strengthening, Both, 5 reps, Seated, Theraband Theraband Level (Elbow Extension): Level 1 (Yellow) Wrist Flexion: Strengthening, Both, 5 reps, Seated, Theraband Theraband Level (Wrist Flexion): Level 1 (Yellow)    Shoulder Instructions       General Comments patient asking often for therapist to give her more water/ice and needs reminding of fluid restrictions    Pertinent Vitals/ Pain       Pain Assessment Pain Assessment: Faces Faces Pain Scale: Hurts little more Pain Location: back, bottom while seated in recliner Pain Descriptors / Indicators: Aching, Discomfort, Grimacing Pain Intervention(s): Limited activity within patient's tolerance, Monitored during session, Repositioned, Patient requesting pain meds-RN notified  Home Living                                          Prior Functioning/Environment              Frequency  Min 2X/week        Progress Toward Goals  OT Goals(current goals can now be found in the care plan section)  Progress towards OT goals: Progressing toward goals  Acute Rehab OT Goals Patient Stated Goal: to go home OT Goal Formulation: With patient Time For Goal Achievement: 11/20/23 Potential to Achieve Goals: Fair ADL Goals Pt Will Perform Grooming: with contact guard assist;standing Pt Will Perform Lower Body Dressing: with supervision;sitting/lateral leans;sit to/from stand;with adaptive equipment Pt Will Transfer to Toilet: with min assist;bedside commode Pt Will Perform Toileting - Clothing Manipulation and hygiene: with supervision;sit to/from stand;sitting/lateral  leans Pt/caregiver will Perform Home Exercise Program: Both right and left upper extremity;With written HEP provided;With theraband  Plan      Co-evaluation                 AM-PAC OT 6 Clicks Daily Activity     Outcome Measure   Help from another person eating meals?: None Help from another person taking care of personal grooming?: A Little Help from another person toileting, which includes using toliet, bedpan, or urinal?: A Lot Help from another person bathing (including washing, rinsing, drying)?: A Lot Help from another person to put on and taking off regular upper body clothing?: A Lot Help from another person to put on and taking off regular lower body clothing?: A Little 6 Click Score: 16    End of Session Equipment Utilized During Treatment: Gait belt;Rolling walker (2 wheels)  OT Visit Diagnosis: Other abnormalities of gait and mobility (R26.89);Muscle weakness (generalized) (M62.81);Pain;Other symptoms and signs involving cognitive function Pain - part of body:  (back and bottom)   Activity Tolerance Patient tolerated treatment well;Patient limited by pain   Patient Left in bed;with call bell/phone within reach;with bed alarm set   Nurse Communication Mobility status;Patient requests pain meds        Time: 8853-8783 OT Time Calculation (min): 30 min  Charges: OT General Charges $OT Visit: 1 Visit OT Treatments $Therapeutic Activity: 8-22 mins $Therapeutic Exercise: 8-22 mins  Dick Wood, OTA Acute Rehabilitation Services  Office [phone]   Joan Wood 11/08/2023, 2:26 PM

## 2023-11-09 DIAGNOSIS — K7460 Unspecified cirrhosis of liver: Secondary | ICD-10-CM | POA: Diagnosis not present

## 2023-11-09 DIAGNOSIS — K922 Gastrointestinal hemorrhage, unspecified: Secondary | ICD-10-CM | POA: Diagnosis not present

## 2023-11-09 DIAGNOSIS — K7290 Hepatic failure, unspecified without coma: Secondary | ICD-10-CM | POA: Diagnosis not present

## 2023-11-09 DIAGNOSIS — R112 Nausea with vomiting, unspecified: Secondary | ICD-10-CM | POA: Diagnosis not present

## 2023-11-09 DIAGNOSIS — D62 Acute posthemorrhagic anemia: Secondary | ICD-10-CM | POA: Diagnosis not present

## 2023-11-09 LAB — COMPREHENSIVE METABOLIC PANEL WITH GFR
ALT: 32 U/L (ref 0–44)
AST: 103 U/L — ABNORMAL HIGH (ref 15–41)
Albumin: 2.3 g/dL — ABNORMAL LOW (ref 3.5–5.0)
Alkaline Phosphatase: 67 U/L (ref 38–126)
Anion gap: 10 (ref 5–15)
BUN: 25 mg/dL — ABNORMAL HIGH (ref 6–20)
CO2: 21 mmol/L — ABNORMAL LOW (ref 22–32)
Calcium: 8.2 mg/dL — ABNORMAL LOW (ref 8.9–10.3)
Chloride: 93 mmol/L — ABNORMAL LOW (ref 98–111)
Creatinine, Ser: 0.62 mg/dL (ref 0.44–1.00)
GFR, Estimated: >60 mL/min (ref >60)
Glucose, Bld: 88 mg/dL (ref 70–99)
Potassium: 4.7 mmol/L (ref 3.5–5.1)
Sodium: 124 mmol/L — ABNORMAL LOW (ref 135–145)
Total Bilirubin: 3.1 mg/dL — ABNORMAL HIGH (ref 0.0–1.2)
Total Protein: 6.7 g/dL (ref 6.5–8.1)

## 2023-11-09 LAB — PROTIME-INR
INR: 1.8 — ABNORMAL HIGH (ref 0.8–1.2)
Prothrombin Time: 22.2 s — ABNORMAL HIGH (ref 11.4–15.2)

## 2023-11-09 LAB — CBC
HCT: 22.2 % — ABNORMAL LOW (ref 36.0–46.0)
Hemoglobin: 7.7 g/dL — ABNORMAL LOW (ref 12.0–15.0)
MCH: 34.1 pg — ABNORMAL HIGH (ref 26.0–34.0)
MCHC: 34.7 g/dL (ref 30.0–36.0)
MCV: 98.2 fL (ref 80.0–100.0)
Platelets: 114 K/uL — ABNORMAL LOW (ref 150–400)
RBC: 2.26 MIL/uL — ABNORMAL LOW (ref 3.87–5.11)
RDW: 18.1 % — ABNORMAL HIGH (ref 11.5–15.5)
WBC: 8.1 K/uL (ref 4.0–10.5)
nRBC: 0 % (ref 0.0–0.2)

## 2023-11-09 LAB — SODIUM
Sodium: 125 mmol/L — ABNORMAL LOW (ref 135–145)
Sodium: 125 mmol/L — ABNORMAL LOW (ref 135–145)

## 2023-11-09 MED ORDER — LIDOCAINE 5 % EX PTCH
2.0000 | MEDICATED_PATCH | CUTANEOUS | Status: AC
  Administered 2023-11-09: 2 via TRANSDERMAL
  Filled 2023-11-09: qty 2

## 2023-11-09 MED ORDER — MIDODRINE HCL 5 MG PO TABS
10.0000 mg | ORAL_TABLET | ORAL | Status: AC
Start: 2023-11-09 — End: 2023-11-09
  Administered 2023-11-09: 10 mg via ORAL
  Filled 2023-11-09: qty 2

## 2023-11-09 NOTE — Plan of Care (Signed)
  Problem: Health Behavior/Discharge Planning: Goal: Ability to manage health-related needs will improve Outcome: Not Progressing   Problem: Clinical Measurements: Goal: Ability to maintain clinical measurements within normal limits will improve Outcome: Not Progressing Goal: Will remain free from infection Outcome: Not Progressing Goal: Diagnostic test results will improve Outcome: Not Progressing Goal: Respiratory complications will improve Outcome: Not Progressing Goal: Cardiovascular complication will be avoided Outcome: Not Progressing   Problem: Activity: Goal: Risk for activity intolerance will decrease Outcome: Not Progressing   Problem: Nutrition: Goal: Adequate nutrition will be maintained Outcome: Not Progressing   Problem: Coping: Goal: Level of anxiety will decrease Outcome: Not Progressing   Problem: Elimination: Goal: Will not experience complications related to bowel motility Outcome: Not Progressing Goal: Will not experience complications related to urinary retention Outcome: Not Progressing   Problem: Pain Managment: Goal: General experience of comfort will improve and/or be controlled Outcome: Not Progressing   Problem: Safety: Goal: Ability to remain free from injury will improve Outcome: Not Progressing   Problem: Skin Integrity: Goal: Risk for impaired skin integrity will decrease Outcome: Not Progressing

## 2023-11-09 NOTE — Progress Notes (Signed)
 Progress Note   Patient: Joan Wood FMW:[id number] DOB: 1970-03-19 DOA: 10/03/2023     37 DOS: the patient was seen and examined on 11/09/2023   Brief hospital course: 53 y.o. female with a history of decompensated liver cirrhosis, PUD, gastric ulcer perforation status post ex lap, GERD, pancreatitis, obstructive sleep apnea. Patient presented secondary to nausea, coffee-ground emesis, bloody diarrhea with concern for upper GI bleed. 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 Hospitalization complicated by hepatic encephalopathy treated with lactulose and rifaximin. Patient also requiring recurrent paracenteses for her recurrent ascites.   Assessment and Plan: Decompensated alcoholic cirrhosis with ascites Patient treated empirically with Ceftriaxone  for possible SBP, although ascites fluid cell count not consistent with diagnosis. Palliative care consulted for ongoing goals of care discussions. -Continue to treat hepatic encephalopathy -Continue paracenteses as needed for symptoms. Remains asymptomatic   Hepatic encephalopathy Improved with lactulose and Rifaximin. Patient's insurance will not cover Rifaximin on discharge. -Continue lactulose -When plan for discharge developed, will trial off Rifaximin   Transient alteration of awareness Patient developed significant change in mental status and was difficult to arose. PCCM was consulted for concern that patient would continue to decompensate. Patient spontaneously improved about 30 minutes later. Unclear etiology. She does not appear to have any post-ictal type presentation. She had an elevated temperature; no leukocytosis. Also complicated by patient's hyponatremia. Blood cultures with no growth to date.   Hypoalbuminemia Secondary to underlying liver disease.    Thrombocytopenia Secondary to underlying liver disease.    AKI Presumed secondary to ATN from contrast. Nephrology was consulted for management. Renal function improved. Patient is not an HD candidate.   Appendicitis General surgery consulted. Patient high-risk for surgical management with recommendation for conservative management. Patient completed antibiotic regimen.   Chronic hypotension Worse this morning. Associated drop in hemoglobin, which may be etiology. Patient has received albumin  IV intermittently. -Continue midodrine  15 mg TID   Chronic hyponatremia Patient's baseline appears to be around 125 -128.  Sodium has significantly worsened. Nephrology consulted on 10/24. Lasix started with initial improvement, however sodium is back down to 118 today. -Continue fluid restriction (1,200 mL) -Nephrology recommendations (10/27): Lasix IV BID and salt tabs 1 g BID -Na improved to 131. Main issue is drinking too much fluids per Nephro -Nephro has since signed off 10/30 -Na trending down again. Will recheck in AM   Acute upper GI bleed Acute esophageal necrosis Acute blood loss anemia Patient seen and evaluated by gastroenterology. Upper GI endoscopy performed on 9/25 with evidence of grade D esophagitis in addition to concern for acute esophageal necrosis. Non-bleeding gastric ulcer noted with sutures. For esophageal necrosis, GI recommended carafate  for two weeks in addition to Protonix  BID and outpatient GI follow-up. Patient with recurrent anemia on 10/24 without obvious hemorrhaging noted; hemoglobin down to 6.9. Patient has received a total of 2 units of PRBC. Hemoglobin up to 8.0 post-transfusion. Sable.   Abdominal pain Presumed secondary to abdominal distension from ascites. Recurrent. Last paracentesis on 10/27.   Lower extremity edema VTE ruled out. Likely related to hypoalbuminemia from liver disease.   Hypervolemic hyponatremia Sodium is stable.    Hypomagnesemia Low. Replaced   Hypokalemia Remains low, replaced   Tobacco abuse -Continue nicotine  patch   Alcohol abuse Noted.   Severe malnutrition -Dietitian recommendations (10/21): Continue current diet as ordered Encourage PO intake Continue ordering assistance Continue Ensure Plus High  Protein po TID, each supplement provides 350 kcal and 20 grams of protein. 1 packet Juven BID, each packet provides 95 calories, 2.5 grams of protein (collagen),  to support wound healing Continue MVI with minerals daily, thiamine  and folic acid  for alcohol use history   Goals of care Goals noted for rehab at SNF. TOC consulted and following   Pressure injury Mid sacrum. Unclear if present on admission.      Subjective: Reports having some difficulty ordering what she wants from food service. No other issues  Physical Exam: Vitals:   11/09/23 0821 11/09/23 1154 11/09/23 1230 11/09/23 1503  BP: (!) 83/53 (!) 80/52 (!) 84/53 (!) 88/56  Pulse: 98 92  99  Resp: 16 15 16 16   Temp: 98.9 F (37.2 C) 98.1 F (36.7 C) 98.1 F (36.7 C) 98.5 F (36.9 C)  TempSrc: Oral Oral  Oral  SpO2: 97% 100% 100% 100%  Weight:      Height:       General exam: Awake, laying in bed, in nad Respiratory system: Normal respiratory effort, no wheezing Cardiovascular system: regular rate, s1, s2 Gastrointestinal system: Soft, nondistended, positive BS Central nervous system: CN2-12 grossly intact, strength intact Extremities: Perfused, no clubbing Skin: Normal skin turgor, no notable skin lesions seen Psychiatry: Mood normal // no visual hallucinations   Data Reviewed:  Labs reviewed: Na 125, K 4.7, Cr 0.62, WBC 8.1, Hgb 7.7, Plts 114, Alb 1.3  Family Communication: Pt in room, family not at bedside  Disposition: Status is: Inpatient Remains inpatient appropriate because: severity of illness  Planned Discharge Destination: Skilled nursing facility    Author: Garnette Pelt, MD 11/09/2023  6:32 PM  For on call review [email or web address].

## 2023-11-09 NOTE — Plan of Care (Signed)
  Problem: Elimination: Goal: Will not experience complications related to bowel motility Outcome: Progressing   Problem: Elimination: Goal: Will not experience complications related to urinary retention Outcome: Progressing   Problem: Pain Managment: Goal: General experience of comfort will improve and/or be controlled Outcome: Progressing

## 2023-11-10 DIAGNOSIS — D62 Acute posthemorrhagic anemia: Secondary | ICD-10-CM | POA: Diagnosis not present

## 2023-11-10 DIAGNOSIS — K7290 Hepatic failure, unspecified without coma: Secondary | ICD-10-CM | POA: Diagnosis not present

## 2023-11-10 DIAGNOSIS — R197 Diarrhea, unspecified: Secondary | ICD-10-CM | POA: Diagnosis not present

## 2023-11-10 DIAGNOSIS — K7460 Unspecified cirrhosis of liver: Secondary | ICD-10-CM | POA: Diagnosis not present

## 2023-11-10 DIAGNOSIS — R112 Nausea with vomiting, unspecified: Secondary | ICD-10-CM | POA: Diagnosis not present

## 2023-11-10 DIAGNOSIS — K922 Gastrointestinal hemorrhage, unspecified: Secondary | ICD-10-CM | POA: Diagnosis not present

## 2023-11-10 LAB — MAGNESIUM: Magnesium: 1.7 mg/dL (ref 1.7–2.4)

## 2023-11-10 LAB — COMPREHENSIVE METABOLIC PANEL WITH GFR
ALT: 27 U/L (ref 0–44)
AST: 88 U/L — ABNORMAL HIGH (ref 15–41)
Albumin: 2.5 g/dL — ABNORMAL LOW (ref 3.5–5.0)
Alkaline Phosphatase: 57 U/L (ref 38–126)
Anion gap: 10 (ref 5–15)
BUN: 27 mg/dL — ABNORMAL HIGH (ref 6–20)
CO2: 23 mmol/L (ref 22–32)
Calcium: 8.5 mg/dL — ABNORMAL LOW (ref 8.9–10.3)
Chloride: 93 mmol/L — ABNORMAL LOW (ref 98–111)
Creatinine, Ser: 0.69 mg/dL (ref 0.44–1.00)
GFR, Estimated: >60 mL/min (ref >60)
Glucose, Bld: 104 mg/dL — ABNORMAL HIGH (ref 70–99)
Potassium: 4.3 mmol/L (ref 3.5–5.1)
Sodium: 126 mmol/L — ABNORMAL LOW (ref 135–145)
Total Bilirubin: 2.6 mg/dL — ABNORMAL HIGH (ref 0.0–1.2)
Total Protein: 6.5 g/dL (ref 6.5–8.1)

## 2023-11-10 LAB — HEMOGLOBIN AND HEMATOCRIT, BLOOD
HCT: 25.7 % — ABNORMAL LOW (ref 36.0–46.0)
Hemoglobin: 9 g/dL — ABNORMAL LOW (ref 12.0–15.0)

## 2023-11-10 LAB — CBC
HCT: 19.7 % — ABNORMAL LOW (ref 36.0–46.0)
Hemoglobin: 6.8 g/dL — CL (ref 12.0–15.0)
MCH: 34 pg (ref 26.0–34.0)
MCHC: 34.5 g/dL (ref 30.0–36.0)
MCV: 98.5 fL (ref 80.0–100.0)
Platelets: 118 K/uL — ABNORMAL LOW (ref 150–400)
RBC: 2 MIL/uL — ABNORMAL LOW (ref 3.87–5.11)
RDW: 18 % — ABNORMAL HIGH (ref 11.5–15.5)
WBC: 7.3 K/uL (ref 4.0–10.5)
nRBC: 0 % (ref 0.0–0.2)

## 2023-11-10 LAB — PREPARE RBC (CROSSMATCH)

## 2023-11-10 LAB — PROTIME-INR
INR: 1.9 — ABNORMAL HIGH (ref 0.8–1.2)
Prothrombin Time: 23.1 s — ABNORMAL HIGH (ref 11.4–15.2)

## 2023-11-10 MED ORDER — ONDANSETRON 4 MG PO TBDP
4.0000 mg | ORAL_TABLET | Freq: Three times a day (TID) | ORAL | Status: AC | PRN
  Administered 2023-11-11 – 2024-02-15 (×102): 4 mg via ORAL
  Filled 2023-11-10 (×99): qty 1

## 2023-11-10 MED ORDER — SODIUM CHLORIDE 0.9 % IV BOLUS
250.0000 mL | INTRAVENOUS | Status: AC
  Administered 2023-11-10: 250 mL via INTRAVENOUS

## 2023-11-10 MED ORDER — LACTULOSE 10 GM/15ML PO SOLN
20.0000 g | Freq: Two times a day (BID) | ORAL | Status: DC
  Administered 2023-11-10 – 2023-11-12 (×4): 20 g via ORAL
  Filled 2023-11-10 (×4): qty 30

## 2023-11-10 MED ORDER — MAGNESIUM SULFATE 4 GM/100ML IV SOLN
4.0000 g | Freq: Once | INTRAVENOUS | Status: DC
Start: 2023-11-10 — End: 2023-11-10

## 2023-11-10 MED ORDER — ALBUMIN HUMAN 25 % IV SOLN
25.0000 g | Freq: Once | INTRAVENOUS | Status: DC
  Filled 2023-11-10: qty 100

## 2023-11-10 MED ORDER — MAGNESIUM SULFATE 2 GM/50ML IV SOLN
2.0000 g | Freq: Once | INTRAVENOUS | Status: AC
  Administered 2023-11-10: 2 g via INTRAVENOUS
  Filled 2023-11-10: qty 50

## 2023-11-10 MED ORDER — SODIUM CHLORIDE 0.9% IV SOLUTION: Freq: Once | INTRAVENOUS | Status: AC

## 2023-11-10 MED ORDER — ALBUMIN HUMAN 25 % IV SOLN
25.0000 g | INTRAVENOUS | Status: AC
  Administered 2023-11-10: 25 g via INTRAVENOUS
  Filled 2023-11-10: qty 100

## 2023-11-10 NOTE — Progress Notes (Signed)
 [facility] night cross cover note:   I was notified by the patient's RN that the patient lost her peripheral IV access this evening and is refusing reestablishment of IV access at this time.  Per my discussions with the patient's RN, the patient has acknowledged recommendation for reestablishment of IV access, as well as acknowledges that she will be unable to receive her Sandostatin  until IV access has been reestablished.  She otherwise has no scheduled IV medications due this evening nor is she currently on any IV fluids.  She is currently complaining of residual nausea.  I subsequently added as needed ODT Zofran  for the patient's nausea.    Eva Pore, DO Hospitalist

## 2023-11-10 NOTE — Progress Notes (Signed)
 Pt PIV site pink, swollen and painful. PIV removed. Pt refusing a new PIV tonight. She states she will get one tomorrow morning. Pt has IV octreotide  ordered and also PRN IV nausea medication. She is currently c/o nausea. RN informed pt she can not receive the nausea medication or octreotide  without placement of a new PIV. Pt replied, That's fine. I will get one tomorrow. Dr. Marcene notified.

## 2023-11-10 NOTE — Plan of Care (Signed)
  Problem: Clinical Measurements: Goal: Ability to maintain clinical measurements within normal limits will improve Outcome: Progressing Goal: Will remain free from infection Outcome: Progressing   Problem: Nutrition: Goal: Adequate nutrition will be maintained Outcome: Progressing   Problem: Coping: Goal: Level of anxiety will decrease Outcome: Progressing   Problem: Pain Managment: Goal: General experience of comfort will improve and/or be controlled Outcome: Progressing   Problem: Safety: Goal: Ability to remain free from injury will improve Outcome: Progressing

## 2023-11-10 NOTE — Plan of Care (Signed)

## 2023-11-10 NOTE — Progress Notes (Signed)
 Progress Note   Patient: Joan Wood FMW:[id number] DOB: [birth year] DOA: 10/03/2023     38 DOS: the patient was seen and examined on 11/10/2023   Brief hospital course: 53 y.o. female with a history of decompensated liver cirrhosis, PUD, gastric ulcer perforation status post ex lap, GERD, pancreatitis, obstructive sleep apnea. Patient presented secondary to nausea, coffee-ground emesis, bloody diarrhea with concern for upper GI bleed. 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 Hospitalization complicated by hepatic encephalopathy treated with lactulose and rifaximin. Patient also requiring recurrent paracenteses for her recurrent ascites.   Assessment and Plan: Decompensated alcoholic cirrhosis with ascites Patient treated empirically with Ceftriaxone  for possible SBP, although ascites fluid cell count not consistent with diagnosis. Palliative care consulted for ongoing goals of care discussions. -Continue to treat hepatic encephalopathy -Complains of abd pain this AM. Abd appears mildly distended. Will request repeat US  paracentesis. Holding salt tabs   Hepatic encephalopathy Improved with lactulose and Rifaximin. Patient's insurance will not cover Rifaximin on discharge. -Reporting over 6 BM over the past day. Will decrease lactulose to 20mg  bid and hold rifaximin   Transient alteration of awareness Patient developed significant change in mental status and was difficult to arose. PCCM was consulted for concern that patient would continue to decompensate. Patient spontaneously improved about 30 minutes later. Unclear etiology. She does not appear to have any post-ictal type presentation. She had an elevated temperature; no leukocytosis. Also complicated by patient's hyponatremia. Blood cultures with no growth to date.    Hypoalbuminemia Secondary to underlying liver disease.   Thrombocytopenia Secondary to underlying liver disease.    AKI Presumed secondary to ATN from contrast. Nephrology was consulted for management. Renal function improved. Patient is not an HD candidate.   Appendicitis General surgery consulted. Patient high-risk for surgical management with recommendation for conservative management. Patient completed antibiotic regimen.   Chronic hypotension Worse this morning. Associated drop in hemoglobin, which may be etiology. Patient has received albumin  IV intermittently. -Continue midodrine  15 mg TID   Chronic hyponatremia Patient's baseline appears to be around 125 -128.  Sodium has significantly worsened. Nephrology consulted on 10/24. Lasix started with initial improvement, however sodium is back down to 118 today. -Continue fluid restriction (1,200 mL) -Nephrology recommendations (10/27): Lasix IV BID -Na improved to 131. Main issue is drinking too much fluids per Nephro -Nephro has since signed off 10/30 -Na trending down again. Will recheck in AM -Will hold further salt tabs given risk of vol overload   Acute upper GI bleed Acute esophageal necrosis Acute blood loss anemia Patient seen and evaluated by gastroenterology. Upper GI endoscopy performed on 9/25 with evidence of grade D esophagitis in addition to concern for acute esophageal necrosis. Non-bleeding gastric ulcer noted with sutures. For esophageal necrosis, GI recommended carafate  for two weeks in addition to Protonix  BID and outpatient GI follow-up. Patient with recurrent anemia on 10/24 without obvious hemorrhaging noted and again on 11/1 -Total received 4 units PRBC's this admit   Abdominal pain Presumed secondary to abdominal distension from ascites.  -repeat US  para 11/1   Lower extremity edema VTE ruled out. Likely related to hypoalbuminemia from liver disease.   Hypervolemic hyponatremia Sodium is stable.    Hypomagnesemia Low. Replaced   Hypokalemia Remains low, replaced   Tobacco abuse -Continue nicotine  patch   Alcohol abuse Noted.   Severe malnutrition -Dietitian recommendations (10/21): Continue current diet as  ordered Encourage PO intake Continue ordering assistance Continue Ensure Plus High Protein po TID, each supplement provides 350 kcal and 20 grams of protein. 1 packet Juven BID, each packet provides 95 calories, 2.5 grams of protein (collagen),  to support wound healing Continue MVI with minerals daily, thiamine  and folic acid  for alcohol use history   Goals of care Goals noted for rehab at SNF. TOC consulted and following   Pressure injury Mid sacrum. Unclear if present on admission.      Subjective: Complains of abd discomfort  Physical Exam: Vitals:   11/10/23 1057 11/10/23 1159 11/10/23 1345 11/10/23 1519  BP: 118/60 111/62 126/69 118/63  Pulse: 85 92 79 87  Resp: 16 16 18 15   Temp: 98.5 F (36.9 C) 98.5 F (36.9 C) 97.8 F (36.6 C) 98 F (36.7 C)  TempSrc:  Oral Oral Oral  SpO2:  99%  100%  Weight:      Height:       General exam: Conversant, in no acute distress Respiratory system: normal chest rise, clear, no audible wheezing Cardiovascular system: regular rhythm, s1-s2 Gastrointestinal system: Distended, generally tender Central nervous system: No seizures, no tremors Extremities: No cyanosis, no joint deformities Skin: No rashes, no pallor Psychiatry: Affect normal // no auditory hallucinations   Data Reviewed:  Labs reviewed: Na 126, K 4.3, Cr 0.69, WBC 7.3, hgb 6.8, Plts 118  Family Communication: Pt in room, family not at bedside  Disposition: Status is: Inpatient Remains inpatient appropriate because: severity of illness  Planned Discharge Destination: Skilled nursing facility    Author: Garnette Pelt, MD 11/10/2023 5:41 PM  For on call review [email or web address].

## 2023-11-11 ENCOUNTER — Inpatient Hospital Stay (HOSPITAL_COMMUNITY)

## 2023-11-11 DIAGNOSIS — K922 Gastrointestinal hemorrhage, unspecified: Secondary | ICD-10-CM | POA: Diagnosis not present

## 2023-11-11 DIAGNOSIS — D62 Acute posthemorrhagic anemia: Secondary | ICD-10-CM | POA: Diagnosis not present

## 2023-11-11 DIAGNOSIS — K7290 Hepatic failure, unspecified without coma: Secondary | ICD-10-CM | POA: Diagnosis not present

## 2023-11-11 DIAGNOSIS — R112 Nausea with vomiting, unspecified: Secondary | ICD-10-CM | POA: Diagnosis not present

## 2023-11-11 DIAGNOSIS — K7460 Unspecified cirrhosis of liver: Secondary | ICD-10-CM | POA: Diagnosis not present

## 2023-11-11 DIAGNOSIS — R197 Diarrhea, unspecified: Secondary | ICD-10-CM | POA: Diagnosis not present

## 2023-11-11 LAB — CBC
HCT: 24.4 % — ABNORMAL LOW (ref 36.0–46.0)
Hemoglobin: 8.8 g/dL — ABNORMAL LOW (ref 12.0–15.0)
MCH: 33.7 pg (ref 26.0–34.0)
MCHC: 36.1 g/dL — ABNORMAL HIGH (ref 30.0–36.0)
MCV: 93.5 fL (ref 80.0–100.0)
Platelets: 116 K/uL — ABNORMAL LOW (ref 150–400)
RBC: 2.61 MIL/uL — ABNORMAL LOW (ref 3.87–5.11)
RDW: 17.5 % — ABNORMAL HIGH (ref 11.5–15.5)
WBC: 8.2 K/uL (ref 4.0–10.5)
nRBC: 0 % (ref 0.0–0.2)

## 2023-11-11 LAB — COMPREHENSIVE METABOLIC PANEL WITH GFR
ALT: 25 U/L (ref 0–44)
AST: 77 U/L — ABNORMAL HIGH (ref 15–41)
Albumin: 2.4 g/dL — ABNORMAL LOW (ref 3.5–5.0)
Alkaline Phosphatase: 57 U/L (ref 38–126)
Anion gap: 11 (ref 5–15)
BUN: 20 mg/dL (ref 6–20)
CO2: 22 mmol/L (ref 22–32)
Calcium: 7.9 mg/dL — ABNORMAL LOW (ref 8.9–10.3)
Chloride: 91 mmol/L — ABNORMAL LOW (ref 98–111)
Creatinine, Ser: 0.59 mg/dL (ref 0.44–1.00)
GFR, Estimated: >60 mL/min (ref >60)
Glucose, Bld: 102 mg/dL — ABNORMAL HIGH (ref 70–99)
Potassium: 2.6 mmol/L — CL (ref 3.5–5.1)
Sodium: 124 mmol/L — ABNORMAL LOW (ref 135–145)
Total Bilirubin: 3.2 mg/dL — ABNORMAL HIGH (ref 0.0–1.2)
Total Protein: 6.5 g/dL (ref 6.5–8.1)

## 2023-11-11 LAB — TYPE AND SCREEN
ABO/RH(D): A POS
Antibody Screen: NEGATIVE
Unit division: 0

## 2023-11-11 LAB — MAGNESIUM: Magnesium: 1.6 mg/dL — ABNORMAL LOW (ref 1.7–2.4)

## 2023-11-11 LAB — GRAM STAIN

## 2023-11-11 LAB — BPAM RBC
Blood Product Expiration Date: 202511222359
ISSUE DATE / TIME: 202511011035
Unit Type and Rh: 6200

## 2023-11-11 LAB — BODY FLUID CELL COUNT WITH DIFFERENTIAL
Eos, Fluid: 0 %
Lymphs, Fluid: 39 %
Monocyte-Macrophage-Serous Fluid: 49 % — ABNORMAL LOW (ref 50–90)
Neutrophil Count, Fluid: 12 % (ref 0–25)
Total Nucleated Cell Count, Fluid: 162 uL (ref 0–1000)

## 2023-11-11 LAB — PROTIME-INR
INR: 1.8 — ABNORMAL HIGH (ref 0.8–1.2)
Prothrombin Time: 21.5 s — ABNORMAL HIGH (ref 11.4–15.2)

## 2023-11-11 MED ORDER — POTASSIUM CHLORIDE CRYS ER 20 MEQ PO TBCR
40.0000 meq | EXTENDED_RELEASE_TABLET | ORAL | Status: AC
Start: 2023-11-11 — End: 2023-11-11
  Administered 2023-11-11 (×2): 40 meq via ORAL
  Filled 2023-11-11 (×2): qty 2

## 2023-11-11 MED ORDER — MAGNESIUM SULFATE 4 GM/100ML IV SOLN
4.0000 g | Freq: Once | INTRAVENOUS | Status: AC
  Administered 2023-11-11: 4 g via INTRAVENOUS
  Filled 2023-11-11: qty 100

## 2023-11-11 MED ORDER — FUROSEMIDE 10 MG/ML IJ SOLN
60.0000 mg | Freq: Two times a day (BID) | INTRAMUSCULAR | Status: DC
  Administered 2023-11-11 – 2023-11-12 (×2): 60 mg via INTRAVENOUS
  Filled 2023-11-11 (×2): qty 8

## 2023-11-11 MED ORDER — ALBUMIN HUMAN 25 % IV SOLN
25.0000 g | Freq: Once | INTRAVENOUS | Status: AC
  Administered 2023-11-11: 25 g via INTRAVENOUS
  Filled 2023-11-11: qty 100

## 2023-11-11 NOTE — Progress Notes (Signed)
 [facility] night cross cover note:   I was notified by the patient's RN of morning potassium level of 2.6.  Patient currently without IV access, as the patient continues to refuse reestablishment of IV access.   I subsequently ordered kcl 40 meq po q4 hours x 2 occurrences , and added-on a magnesium  level.      Eva Pore, DO Hospitalist

## 2023-11-11 NOTE — Progress Notes (Addendum)
 Progress Note   Patient: Joan Wood FMW:[id number] DOB: 1970/05/20 DOA: 10/03/2023     39 DOS: the patient was seen and examined on 11/11/2023   Brief hospital course: 53 y.o. female with a history of decompensated liver cirrhosis, PUD, gastric ulcer perforation status post ex lap, GERD, pancreatitis, obstructive sleep apnea. Patient presented secondary to nausea, coffee-ground emesis, bloody diarrhea with concern for upper GI bleed. 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 Hospitalization complicated by hepatic encephalopathy treated with lactulose and rifaximin. Patient also requiring recurrent paracenteses for her recurrent ascites.   Assessment and Plan: Decompensated alcoholic cirrhosis with ascites Patient treated empirically with Ceftriaxone  for possible SBP, although ascites fluid cell count not consistent with diagnosis. Palliative care consulted for ongoing goals of care discussions. -Continue to treat hepatic encephalopathy -Abd appears mildly distended. US  paracentesis pending   Hepatic encephalopathy Improved with lactulose and Rifaximin. Patient's insurance will not cover Rifaximin on discharge. -Reported over 6 BM over the past day, decreased lactulose to 20mg  bid and hold rifaximin -cont to titrate lactulose as needed. Goal 2-3 BM/day   Transient alteration of awareness Patient developed significant change in mental status and was difficult to arose. PCCM was consulted for concern that patient would continue to decompensate. Patient spontaneously improved about 30 minutes later. Unclear etiology. She does not appear to have any post-ictal type presentation. She had an elevated temperature; no leukocytosis. Also complicated by patient's hyponatremia. Blood cultures with no growth to date. -Mentation appropriate this AM    Hypoalbuminemia Secondary to underlying liver disease.   Thrombocytopenia Secondary to underlying liver disease.    AKI Presumed secondary to ATN from contrast. Nephrology was consulted for management. Renal function improved. Patient is not an HD candidate.   Appendicitis General surgery consulted. Patient high-risk for surgical management with recommendation for conservative management. Patient completed antibiotic regimen.   Chronic hypotension Worse this morning. Associated drop in hemoglobin, which may be etiology. Patient has received albumin  IV intermittently. -Continue midodrine  15 mg TID   Chronic hyponatremia Patient's baseline appears to be around 125 -128.  Sodium has significantly worsened. Nephrology consulted on 10/24. Lasix started with initial improvement, however sodium is back down to 118 today. -Continue fluid restriction (1,200 mL) -Nephrology recommendations (10/27): Lasix IV BID -Na improved to 131. Main issue is drinking too much fluids per Nephro -Nephro has since signed off 10/30 -Na down to 124. Staff reports pt continues to drink ample amounts of additional fluids over 1200cc restriction. WILL NEED TO ENSURE STRICT I/O RESTRICTION. Increase lasix to 60mg  bid   Acute upper GI bleed Acute esophageal necrosis Acute blood loss anemia Patient seen and evaluated by gastroenterology. Upper GI endoscopy performed on 9/25 with evidence of grade D esophagitis in addition to concern for acute esophageal necrosis. Non-bleeding gastric ulcer noted with sutures. For esophageal necrosis, GI recommended carafate  for two weeks in addition to Protonix  BID and outpatient GI follow-up. Patient with recurrent anemia on 10/24 without obvious hemorrhaging noted and again on 11/1 -Total received 4 units PRBC's this admit   Abdominal pain Presumed secondary to abdominal distension from ascites.  -repeat US  para requested, pending   Lower extremity edema VTE ruled out. Likely  related to hypoalbuminemia from liver disease.   Hypervolemic hyponatremia Sodium is stable.   Hypomagnesemia Low. Replaced   Hypokalemia Remains low, replaced   Tobacco abuse -Continue nicotine  patch  Alcohol abuse Noted.   Severe malnutrition -Dietitian recommendations (10/21): Continue current diet as ordered Encourage PO intake Continue ordering assistance Continue Ensure Plus High Protein po TID, each supplement provides 350 kcal and 20 grams of protein. 1 packet Juven BID, each packet provides 95 calories, 2.5 grams of protein (collagen),  to support wound healing Continue MVI with minerals daily, thiamine  and folic acid  for alcohol use history   Goals of care Goals noted for rehab at SNF. TOC consulted and following   Pressure injury Mid sacrum. Unclear if present on admission.      Subjective: Reports feeling better today after reducing lactulose dose  Physical Exam: Vitals:   11/11/23 0012 11/11/23 0430 11/11/23 0749 11/11/23 1128  BP: (!) 92/55 (!) 102/56 95/63 107/68  Pulse: 91 95 96 90  Resp: 16 16 17 17   Temp: 98.1 F (36.7 C) 97.7 F (36.5 C) 98.4 F (36.9 C) 98 F (36.7 C)  TempSrc: Oral Oral Oral Oral  SpO2: 98% 99% 98% 100%  Weight:      Height:       General exam: Awake, laying in bed, in nad Respiratory system: Normal respiratory effort, no wheezing Cardiovascular system: regular rate, s1, s2 Gastrointestinal system: distended, positive BS Central nervous system: CN2-12 grossly intact, strength intact Extremities: Perfused, no clubbing Skin: Normal skin turgor, no notable skin lesions seen Psychiatry: Mood normal // no visual hallucinations   Data Reviewed:  Labs reviewed: Na 124, K 2.6, Cr 0.59, Mg 1.6, WBC 8.2, Hgb 8.8, Plts 116  Family Communication: Pt in room, family not at bedside  Disposition: Status is: Inpatient Remains inpatient appropriate because: severity of illness  Planned Discharge Destination: Skilled nursing  facility    Author: Garnette Pelt, MD 11/11/2023 3:21 PM  For on call review [email or web address].

## 2023-11-11 NOTE — Procedures (Signed)
 PROCEDURE SUMMARY:  Successful image-guided paracentesis from the right lower abdomen.  Yielded 650 mL of hazy yellow fluid.  No immediate complications.  EBL = trace. Patient tolerated well.   Specimen was  sent for labs.  Please see imaging section of Epic for full dictation.  The patient has required >/=2 paracenteses in a 30 day period and a screening evaluation by the [facility] has been arranged.   Wesleigh Markovic H Edwinna Rochette PA-C 11/11/2023 1:58 PM

## 2023-11-11 NOTE — Plan of Care (Signed)
  Problem: Clinical Measurements: Goal: Will remain free from infection Outcome: Progressing   Problem: Nutrition: Goal: Adequate nutrition will be maintained Outcome: Progressing   Problem: Coping: Goal: Level of anxiety will decrease Outcome: Progressing   Problem: Pain Managment: Goal: General experience of comfort will improve and/or be controlled Outcome: Progressing   Problem: Safety: Goal: Ability to remain free from injury will improve Outcome: Progressing

## 2023-11-12 ENCOUNTER — Inpatient Hospital Stay (HOSPITAL_COMMUNITY)

## 2023-11-12 ENCOUNTER — Encounter: Payer: Self-pay | Admitting: Radiology

## 2023-11-12 DIAGNOSIS — D62 Acute posthemorrhagic anemia: Secondary | ICD-10-CM | POA: Diagnosis not present

## 2023-11-12 DIAGNOSIS — R197 Diarrhea, unspecified: Secondary | ICD-10-CM | POA: Diagnosis not present

## 2023-11-12 DIAGNOSIS — K7460 Unspecified cirrhosis of liver: Secondary | ICD-10-CM | POA: Diagnosis not present

## 2023-11-12 DIAGNOSIS — K7290 Hepatic failure, unspecified without coma: Secondary | ICD-10-CM | POA: Diagnosis not present

## 2023-11-12 DIAGNOSIS — R188 Other ascites: Secondary | ICD-10-CM | POA: Diagnosis not present

## 2023-11-12 DIAGNOSIS — R112 Nausea with vomiting, unspecified: Secondary | ICD-10-CM | POA: Diagnosis not present

## 2023-11-12 DIAGNOSIS — K922 Gastrointestinal hemorrhage, unspecified: Secondary | ICD-10-CM | POA: Diagnosis not present

## 2023-11-12 HISTORY — PX: IR PARACENTESIS: IMG2679

## 2023-11-12 LAB — COMPREHENSIVE METABOLIC PANEL WITH GFR
ALT: 20 U/L (ref 0–44)
AST: 79 U/L — ABNORMAL HIGH (ref 15–41)
Albumin: 2.5 g/dL — ABNORMAL LOW (ref 3.5–5.0)
Alkaline Phosphatase: 61 U/L (ref 38–126)
Anion gap: 10 (ref 5–15)
BUN: 15 mg/dL (ref 6–20)
CO2: 25 mmol/L (ref 22–32)
Calcium: 7.9 mg/dL — ABNORMAL LOW (ref 8.9–10.3)
Chloride: 87 mmol/L — ABNORMAL LOW (ref 98–111)
Creatinine, Ser: 0.52 mg/dL (ref 0.44–1.00)
GFR, Estimated: >60 mL/min (ref >60)
Glucose, Bld: 122 mg/dL — ABNORMAL HIGH (ref 70–99)
Potassium: 3.3 mmol/L — ABNORMAL LOW (ref 3.5–5.1)
Sodium: 122 mmol/L — ABNORMAL LOW (ref 135–145)
Total Bilirubin: 2.7 mg/dL — ABNORMAL HIGH (ref 0.0–1.2)
Total Protein: 6.4 g/dL — ABNORMAL LOW (ref 6.5–8.1)

## 2023-11-12 LAB — CBC
HCT: 23.8 % — ABNORMAL LOW (ref 36.0–46.0)
Hemoglobin: 8.6 g/dL — ABNORMAL LOW (ref 12.0–15.0)
MCH: 33.7 pg (ref 26.0–34.0)
MCHC: 36.1 g/dL — ABNORMAL HIGH (ref 30.0–36.0)
MCV: 93.3 fL (ref 80.0–100.0)
Platelets: 116 K/uL — ABNORMAL LOW (ref 150–400)
RBC: 2.55 MIL/uL — ABNORMAL LOW (ref 3.87–5.11)
RDW: 17.4 % — ABNORMAL HIGH (ref 11.5–15.5)
WBC: 8.5 K/uL (ref 4.0–10.5)
nRBC: 0 % (ref 0.0–0.2)

## 2023-11-12 LAB — AMMONIA: Ammonia: 67 umol/L — ABNORMAL HIGH (ref 9–35)

## 2023-11-12 LAB — PROTIME-INR
INR: 1.8 — ABNORMAL HIGH (ref 0.8–1.2)
Prothrombin Time: 21.9 s — ABNORMAL HIGH (ref 11.4–15.2)

## 2023-11-12 LAB — NA AND K (SODIUM & POTASSIUM), RAND UR
Potassium Urine: 32 mmol/L
Sodium, Ur: <30 mmol/L

## 2023-11-12 LAB — MAGNESIUM: Magnesium: 1.8 mg/dL (ref 1.7–2.4)

## 2023-11-12 LAB — CREATININE, URINE, RANDOM: Creatinine, Urine: 32 mg/dL

## 2023-11-12 MED ORDER — FUROSEMIDE 10 MG/ML IJ SOLN
40.0000 mg | Freq: Two times a day (BID) | INTRAMUSCULAR | Status: DC
  Administered 2023-11-12 – 2023-11-19 (×14): 40 mg via INTRAVENOUS
  Filled 2023-11-12 (×14): qty 4

## 2023-11-12 MED ORDER — LIDOCAINE-EPINEPHRINE 1 %-1:100000 IJ SOLN
20.0000 mL | Freq: Once | INTRAMUSCULAR | Status: AC
  Administered 2023-11-12: 8 mL via INTRADERMAL

## 2023-11-12 MED ORDER — LIDOCAINE-EPINEPHRINE 1 %-1:100000 IJ SOLN
INTRAMUSCULAR | Status: AC
  Filled 2023-11-12: qty 1

## 2023-11-12 MED ORDER — LIDOCAINE HCL (PF) 1 % IJ SOLN
10.0000 mL | Freq: Once | INTRAMUSCULAR | Status: DC
  Filled 2023-11-12: qty 10

## 2023-11-12 MED ORDER — UREA 15 G PO PACK
15.0000 g | PACK | Freq: Two times a day (BID) | ORAL | Status: AC
  Administered 2023-11-12 (×2): 15 g via ORAL
  Filled 2023-11-12 (×2): qty 1

## 2023-11-12 MED ORDER — LACTULOSE 10 GM/15ML PO SOLN
20.0000 g | Freq: Three times a day (TID) | ORAL | Status: DC
  Administered 2023-11-12 – 2023-11-22 (×22): 20 g via ORAL
  Filled 2023-11-12 (×25): qty 30

## 2023-11-12 MED ORDER — POTASSIUM CHLORIDE CRYS ER 20 MEQ PO TBCR
40.0000 meq | EXTENDED_RELEASE_TABLET | Freq: Once | ORAL | Status: AC
  Administered 2023-11-12: 40 meq via ORAL
  Filled 2023-11-12: qty 2

## 2023-11-12 MED ORDER — RIFAXIMIN 550 MG PO TABS
550.0000 mg | ORAL_TABLET | Freq: Two times a day (BID) | ORAL | Status: DC
  Administered 2023-11-12 – 2023-11-22 (×21): 550 mg via ORAL
  Filled 2023-11-12 (×21): qty 1

## 2023-11-12 NOTE — Progress Notes (Signed)
 This chaplain is present for F/U on the Pt. Advance Directive. The Pt. is waking up and prefers a revisit at another time. The Pt. and chaplain agree on afternoon visits. The Pt. verbally confirms the Pt. son and daughter will be the Pt. healthcare agents.  Chaplain [name] Hummer [phone]

## 2023-11-12 NOTE — [place] FL2 (Signed)
 [place]  MEDICAID FL2 LEVEL OF CARE FORM     IDENTIFICATION  Patient Name: Joan Wood Birthdate: Mar 01, 1970 Sex: female Admission Date (Current Location): 10/03/2023  [facility] and [place] Number:  [employer] and Address:  [facility], [address], [place], [name] [address]      Provider Number: [id number]  Attending Physician Name and Address:  Cindy Garnette POUR, MD  Relative Name and Phone Number:       Current Level of Care: Hospital Recommended Level of Care: Skilled Nursing Facility Prior Approval Number:    Date Approved/Denied:   PASRR Number: [id number]  Discharge Plan: SNF    Current Diagnoses: Patient Active Problem List   Diagnosis Date Noted   Goals of care, counseling/discussion 10/19/2023   Acute appendicitis 10/17/2023   Pressure injury of skin 10/17/2023   Protein-calorie malnutrition, severe 10/17/2023   AKI (acute kidney injury) 10/15/2023   Decompensated cirrhosis (HCC) 10/08/2023   ABLA (acute blood loss anemia) 10/08/2023   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   Other fatigue 09/17/2023   GI bleed 09/01/2023   Lactic acidosis 09/01/2023   Elevated lipase 09/01/2023   Liver cirrhosis (HCC) 09/01/2023   Colitis 08/28/2023   Hematochezia 08/22/2023   History of gastric ulcer 08/22/2023   Abdominal pain 08/20/2023   Liver lesion 08/14/2023   Malnutrition of moderate degree 08/08/2023   Acute on chronic blood loss anemia 08/08/2023   Pneumoperitoneum 08/05/2023   Perforated abdominal viscus 08/05/2023   Syncope 07/30/2023   Depression, recurrent 07/30/2023   Generalized abdominal pain 06/04/2023   Nausea vomiting and diarrhea 06/04/2023   Enteritis 05/28/2023   Intractable vomiting 05/28/2023   Perforated ulcer (HCC) 05/27/2023   Alcoholic cirrhosis (HCC) 05/27/2023   Hypomagnesemia 05/17/2023   Hyponatremia 05/15/2023   Tobacco abuse 05/15/2023   Acute hyponatremia 05/11/2023   Pancreatitis  05/11/2023   Gastritis 05/11/2023   Hypokalemia 05/11/2023   Multiple closed fractures of metatarsal bone with routine healing, right 04/24/2023   Neuropathy 03/31/2023   Diarrhea 03/31/2023   GERD (gastroesophageal reflux disease) 03/31/2023   Nausea and vomiting 03/31/2023   Alcohol use disorder 03/28/2022   Left hip pain 03/28/2022   Incarcerated umbilical hernia 03/28/2022   Adjustment disorder with mixed anxiety and depressed mood 12/15/2014   Malingering 12/14/2014    Orientation RESPIRATION BLADDER Height & Weight     Self, Time, Situation, Place  Normal Incontinent Weight: 118 lb 1.6 oz (53.6 kg) Height:  5' 2 (157.5 cm)  BEHAVIORAL SYMPTOMS/MOOD NEUROLOGICAL BOWEL NUTRITION STATUS      Incontinent, Continent (incontinent at times) Diet (2 gram sodium, fluid restriction 1200 mL)  AMBULATORY STATUS COMMUNICATION OF NEEDS Skin   Extensive Assist Verbally PU Stage and Appropriate Care, Surgical wounds (closed wound, lower right abdomen; foam dressing: lift every shift to assess and change PRN)   PU Stage 2 Dressing:  (sacrum, foam dressing: lift each shift to assess and change PRN)                   Personal Care Assistance Level of Assistance  Bathing, Feeding, Dressing Bathing Assistance: Maximum assistance Feeding assistance: Independent Dressing Assistance: Maximum assistance     Functional Limitations Info  Sight Sight Info: Impaired        SPECIAL CARE FACTORS FREQUENCY  PT (By licensed PT), OT (By licensed OT)     PT Frequency: 5x/wk OT Frequency: 5x/wk            Contractures  Contractures Info: Not present    Additional Factors Info  Code Status, Allergies Code Status Info: Full Allergies Info: Bee Venom, Fire Ant, Latex           Current Medications (11/12/2023):  This is the current hospital active medication list Current Facility-Administered Medications  Medication Dose Route Frequency Provider Last Rate Last Admin   0.9 %  sodium  chloride infusion (Manually program via Guardrails IV Fluids)   Intravenous Once Shona, Carole N, DO       albuterol  (PROVENTIL ) (2.5 MG/3ML) 0.083% nebulizer solution 2.5 mg  2.5 mg Nebulization Q2H PRN Ricky Fines, MD   2.5 mg at 10/30/23 2012   alum & mag hydroxide-simeth (MAALOX/MYLANTA) 200-200-20 MG/5ML suspension 30 mL  30 mL Oral Q6H PRN Darci Pore, MD   30 mL at 10/09/23 1811   artificial tears ophthalmic solution 2 drop  2 drop Both Eyes PRN Perri DELENA Meliton Mickey., MD   2 drop at 10/23/23 2107   calcium  carbonate (TUMS - dosed in mg elemental calcium ) chewable tablet 200 mg of elemental calcium   1 tablet Oral TID WC Sreeram, Narendranath, MD   200 mg of elemental calcium  at 11/12/23 1207   Chlorhexidine  Gluconate Cloth 2 % PADS 6 each  6 each Topical Daily Norine Manuelita DELENA, MD   6 each at 11/11/23 1132   diphenhydrAMINE  (BENADRYL ) capsule 25 mg  25 mg Oral Q6H PRN Perri DELENA Meliton Mickey., MD   25 mg at 11/09/23 1608   feeding supplement (ENSURE PLUS HIGH PROTEIN) liquid 237 mL  237 mL Oral TID BM Patsy Lenis, MD   237 mL at 11/11/23 2039   folic acid  (FOLVITE ) tablet 1 mg  1 mg Oral Daily Sreeram, Narendranath, MD   1 mg at 11/12/23 9176   furosemide (LASIX) injection 40 mg  40 mg Intravenous BID Melia Lynwood ORN, MD       gabapentin  (NEURONTIN ) capsule 300 mg  300 mg Oral TID Perri DELENA Meliton Mickey., MD   300 mg at 11/12/23 0824   lactulose (CHRONULAC) 10 GM/15ML solution 20 g  20 g Oral TID Cindy Garnette POUR, MD       lidocaine  (LIDODERM ) 5 % 2 patch  2 patch Transdermal Q24H Sreeram, Narendranath, MD   2 patch at 11/12/23 9175   lidocaine  (PF) (XYLOCAINE ) 1 % injection 10 mL  10 mL Other Once Han, Aimee H, PA-C       midodrine  (PROAMATINE ) tablet 20 mg  20 mg Oral TID WC Singh, Vikas, MD   20 mg at 11/12/23 1207   multivitamin with minerals tablet 1 tablet  1 tablet Oral Daily Sreeram, Narendranath, MD   1 tablet at 11/12/23 9176   Muscle Rub CREA   Topical PRN Sreeram,  Narendranath, MD   Given at 10/11/23 1116   nicotine  (NICODERM CQ  - dosed in mg/24 hours) patch 21 mg  21 mg Transdermal Daily Ricky Fines, MD   21 mg at 10/31/23 9040   nutrition supplement (JUVEN) (JUVEN) powder packet 1 packet  1 packet Oral BID BM Patsy Lenis, MD   1 packet at 11/11/23 0846   octreotide  (SANDOSTATIN ) injection 100 mcg  100 mcg Subcutaneous Q8H Singh, Vikas, MD   100 mcg at 11/12/23 0557   ondansetron  (ZOFRAN -ODT) disintegrating tablet 4 mg  4 mg Oral Q8H PRN Howerter, Justin B, DO   4 mg at 11/12/23 0557   Oral care mouth rinse  15 mL Mouth Rinse PRN Neda Jennet DELENA, MD  oxyCODONE  (Oxy IR/ROXICODONE ) immediate release tablet 5 mg  5 mg Oral Q4H PRN Sreeram, Narendranath, MD   5 mg at 11/12/23 0557   pantoprazole  (PROTONIX ) EC tablet 40 mg  40 mg Oral BID Guenther, Paula M, NP   40 mg at 11/12/23 0823   phenol (CHLORASEPTIC) mouth spray 1 spray  1 spray Mouth/Throat PRN Sreeram, Narendranath, MD   1 spray at 10/09/23 1308   prochlorperazine (COMPAZINE) injection 10 mg  10 mg Intravenous Q6H PRN Segars, Jonathan, MD   10 mg at 11/10/23 1403   rifaximin (XIFAXAN) tablet 550 mg  550 mg Oral BID Cindy Garnette POUR, MD   550 mg at 11/12/23 1207   simethicone  (MYLICON) chewable tablet 80 mg  80 mg Oral QID PRN Sreeram, Narendranath, MD   80 mg at 11/09/23 1235   sucralfate  (CARAFATE ) 1 GM/10ML suspension 1 g  1 g Oral Q6H Guenther, Paula M, NP   1 g at 11/12/23 1207   thiamine  (VITAMIN B1) tablet 100 mg  100 mg Oral Daily Sreeram, Narendranath, MD   100 mg at 11/12/23 9176   urea (URE-NA) oral packet 15 g  15 g Oral BID Melia Lynwood ORN, MD   15 g at 11/12/23 1208   witch hazel-glycerin (TUCKS) pad   Topical PRN Perri DELENA Meliton Mickey., MD   Given at 11/02/23 1736     Discharge Medications: Please see discharge summary for a list of discharge medications.  Relevant Imaging Results:  Relevant Lab Results:   Additional Information SS#: [id number]  [name],  LCSW

## 2023-11-12 NOTE — Progress Notes (Signed)
 Physical Therapy Treatment Patient Details Name: Joan Wood MRN: [id number] DOB: [birth year] Today's Date: 11/12/2023   History of Present Illness 53 y.o. female [facility] to [facility] hospital on 10/03/2023 with nausea/vomiting and bloody diarrhea. Pt underwent upper EGD on 9/25, concerning for possible acute esophageal necrosis. Pt also underwent paracentesis on 9/25 and 10/6. PMH includes decompensated cirrhosis, PUD, GERD, pancreatitis, OSA.    PT Comments  Pt tolerates treatment well, ambulating for multiple short bouts. Pt's activity tolerance is limited by suprapubic pain during this session, as pt impulsively sits multiple times due to reports of sharp pain. Pt will benefit from increased time out of bed, although she declines remaining in the chair at the end of this PT session despite PT encouragement. Patient will benefit from continued inpatient follow up therapy, <3 hours/day.    If plan is discharge home, recommend the following: A lot of help with walking and/or transfers;A lot of help with bathing/dressing/bathroom;Assistance with cooking/housework;Assist for transportation;Help with stairs or ramp for entrance;Direct supervision/assist for medications management;Direct supervision/assist for financial management;Supervision due to cognitive status   Can travel by private vehicle     Yes  Equipment Recommendations  Rolling walker (2 wheels);BSC/3in1    Recommendations for Other Services       Precautions / Restrictions Precautions Precautions: Fall Recall of Precautions/Restrictions: Impaired Precaution/Restrictions Comments: orthostatic, foley Restrictions Weight Bearing Restrictions Per Provider Order: No     Mobility  Bed Mobility Overal bed mobility: Needs Assistance Bed Mobility: Rolling, Sidelying to Sit, Sit to Supine Rolling: Supervision Sidelying to sit: Min assist   Sit to supine: Contact guard assist        Transfers Overall transfer level: Needs  assistance Equipment used: Rolling walker (2 wheels) Transfers: Sit to/from Stand, Bed to chair/wheelchair/BSC Sit to Stand: Contact guard assist   Step pivot transfers: Contact guard assist            Ambulation/Gait Ambulation/Gait assistance: Contact guard assist Gait Distance (Feet): 15 Feet (additional trial of 15' and 10') Assistive device: Rolling walker (2 wheels) Gait Pattern/deviations: Step-to pattern Gait velocity: reduced Gait velocity interpretation: <1.31 ft/sec, indicative of household ambulator   General Gait Details: slowed step-to gait, increased trunk flexion over RW   Stairs             Wheelchair Mobility     Tilt Bed    Modified Rankin (Stroke Patients Only)       Balance Overall balance assessment: Needs assistance Sitting-balance support: No upper extremity supported, Feet supported Sitting balance-Leahy Scale: Good     Standing balance support: Bilateral upper extremity supported, Reliant on assistive device for balance Standing balance-Leahy Scale: Poor                              Communication Communication Communication: No apparent difficulties  Cognition Arousal: Alert Behavior During Therapy: Impulsive   PT - Cognitive impairments: Awareness, Problem solving, Safety/Judgement                         Following commands: Impaired Following commands impaired: Follows multi-step commands inconsistently    Cueing Cueing Techniques: Verbal cues, Visual cues  Exercises      General Comments General comments (skin integrity, edema, etc.): VSS, BP 98/57 in supine pre-mobility. BP 86/51 in sitting, BP 97/59 after transfer to recliner. Pt does not report dizziness or orthostatic symptoms during session, Ace wraps utilized on BLE  Pertinent Vitals/Pain Pain Assessment Pain Assessment: Faces Faces Pain Scale: Hurts even more Pain Location: suprapubic Pain Descriptors / Indicators: Grimacing Pain  Intervention(s): Monitored during session    Home Living                          Prior Function            PT Goals (current goals can now be found in the care plan section) Acute Rehab PT Goals Patient Stated Goal: to return to independence Progress towards PT goals: Progressing toward goals    Frequency    Min 2X/week      PT Plan      Co-evaluation              AM-PAC PT 6 Clicks Mobility   Outcome Measure  Help needed turning from your back to your side while in a flat bed without using bedrails?: A Little Help needed moving from lying on your back to sitting on the side of a flat bed without using bedrails?: A Little Help needed moving to and from a bed to a chair (including a wheelchair)?: A Little Help needed standing up from a chair using your arms (e.g., wheelchair or bedside chair)?: A Little Help needed to walk in hospital room?: A Lot Help needed climbing 3-5 steps with a railing? : Total 6 Click Score: 15    End of Session Equipment Utilized During Treatment: Gait belt Activity Tolerance: Patient tolerated treatment well;Other (comment) Patient left: in bed;with call bell/phone within reach;with bed alarm set Nurse Communication: Mobility status PT Visit Diagnosis: Other abnormalities of gait and mobility (R26.89);Muscle weakness (generalized) (M62.81);Unsteadiness on feet (R26.81);Difficulty in walking, not elsewhere classified (R26.2)     Time: 8478-8393 PT Time Calculation (min) (ACUTE ONLY): 45 min  Charges:    $Gait Training: 23-37 mins $Therapeutic Activity: 8-22 mins PT General Charges $$ ACUTE PT VISIT: 1 Visit                     Bernardino JINNY Ruth, PT, DPT Acute Rehabilitation Office [phone]    Bernardino JINNY Ruth 11/12/2023, 4:18 PM

## 2023-11-12 NOTE — Plan of Care (Signed)

## 2023-11-12 NOTE — Plan of Care (Signed)

## 2023-11-12 NOTE — Progress Notes (Signed)
 Progress Note   Patient: Joan Wood FMW:[id number] DOB: [birth year] DOA: 10/03/2023     40 DOS: the patient was seen and examined on 11/12/2023   Brief hospital course: 53 y.o. female with a history of decompensated liver cirrhosis, PUD, gastric ulcer perforation status post ex lap, GERD, pancreatitis, obstructive sleep apnea. Patient presented secondary to nausea, coffee-ground emesis, bloody diarrhea with concern for upper GI bleed. 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 Hospitalization complicated by hepatic encephalopathy treated with lactulose and rifaximin. Patient also requiring recurrent paracenteses for her recurrent ascites.   Assessment and Plan: Decompensated alcoholic cirrhosis with ascites Patient treated empirically with Ceftriaxone  for possible SBP, although ascites fluid cell count not consistent with diagnosis. Palliative care consulted for ongoing goals of care discussions. -Continue to treat hepatic encephalopathy -Abd appears mildly distended. US  paracentesis for today   Hepatic encephalopathy Improved with lactulose and Rifaximin. Patient's insurance will not cover Rifaximin on discharge. -Reported over 6 BM over the weekend, thus decreased lactulose and stopped rifaximin -much less BM this AM. Pt more lethargic -Will increase lactulose to 20g TID and resume rifiaximin   Transient alteration of awareness Patient developed significant change in mental status and was difficult to arose. PCCM was consulted for concern that patient would continue to decompensate. Patient spontaneously improved about 30 minutes later. Unclear etiology. She does not appear to have any post-ictal type presentation. She had an elevated temperature; no leukocytosis. Also complicated by patient's hyponatremia. Blood cultures with no growth to  date. -More lethargic this AM   Hypoalbuminemia Secondary to underlying liver disease.   Thrombocytopenia Secondary to underlying liver disease.    AKI Presumed secondary to ATN from contrast. Nephrology was consulted for management. Renal function improved. Patient is not an HD candidate.   Appendicitis General surgery consulted. Patient high-risk for surgical management with recommendation for conservative management. Patient completed antibiotic regimen.   Chronic hypotension Worse this morning. Associated drop in hemoglobin, which may be etiology. Patient has received albumin  IV intermittently. -Continue midodrine  15 mg TID   Chronic hyponatremia Patient's baseline appears to be around 125 -128.  Sodium has significantly worsened. Nephrology consulted on 10/24. Lasix started with initial improvement, however sodium is back down to 118 today. -Continue fluid restriction (1,200 mL) -Nephrology recommendations (10/27): Lasix IV BID -Na improved to 131. Main issue is drinking too much fluids per Nephro -Nephro has since signed off 10/30 -Na down to 122. Main issue is hypervolemia as pt had not been adhering to vol restriction -Appreciate input by Nephrology if Na cont to trend down, may need hypertonic saline   Acute upper GI bleed Acute esophageal necrosis Acute blood loss anemia Patient seen and evaluated by gastroenterology. Upper GI endoscopy performed on 9/25 with evidence of grade D esophagitis in addition to concern for acute esophageal necrosis. Non-bleeding gastric ulcer noted with sutures. For esophageal necrosis, GI recommended carafate  for two weeks in addition to Protonix  BID and outpatient GI follow-up. Patient with recurrent anemia on 10/24 without obvious hemorrhaging noted and again on 11/1 -Total received 4 units PRBC's this admit   Abdominal pain Presumed secondary to abdominal distension from ascites.  -repeat US  para requested for today   Lower extremity  edema VTE ruled out. Likely related to hypoalbuminemia from liver disease.   Hypervolemic hyponatremia Sodium is stable.   Hypomagnesemia Low. Replaced   Hypokalemia Remains low, replaced  Tobacco abuse -Continue nicotine  patch   Alcohol abuse Noted.   Severe malnutrition -Dietitian recommendations (10/21): Continue current diet as ordered Encourage PO intake Continue ordering assistance Continue Ensure Plus High Protein po TID, each supplement provides 350 kcal and 20 grams of protein. 1 packet Juven BID, each packet provides 95 calories, 2.5 grams of protein (collagen),  to support wound healing Continue MVI with minerals daily, thiamine  and folic acid  for alcohol use history   Goals of care Goals noted for rehab at SNF. TOC consulted and following   Pressure injury Mid sacrum. Unclear if present on admission.      Subjective: Difficult to assess given mentation  Physical Exam: Vitals:   11/12/23 0500 11/12/23 0824 11/12/23 1108 11/12/23 1614  BP:  (!) 98/53 (!) 102/55 (!) 92/59  Pulse:  99 99 89  Resp:  15 15 14   Temp:  98.2 F (36.8 C) 99.8 F (37.7 C) 98.1 F (36.7 C)  TempSrc:  Oral Oral Oral  SpO2:  98% 97% 97%  Weight: 53.6 kg     Height:       General exam: Lethargic, in no acute distress Respiratory system: normal chest rise, clear, no audible wheezing Cardiovascular system: regular rhythm, s1-s2 Gastrointestinal system: Nondistended, nontender, pos BS Central nervous system: No seizures, no tremors Extremities: No cyanosis, no joint deformities Skin: No rashes, no pallor Psychiatry: difficult to assess given mentation  Data Reviewed:  Labs reviewed: Na 122, K 3.3, Cr 0.52, WBC 8.5, hgb 8.6, Plts 116  Family Communication: Pt in room, family not at bedside  Disposition: Status is: Inpatient Remains inpatient appropriate because: severity of illness  Planned Discharge Destination: Skilled nursing facility    Author: Garnette Pelt,  MD 11/12/2023 4:48 PM  For on call review [email or web address].

## 2023-11-12 NOTE — Progress Notes (Signed)
 [facility] Progress Note    Assessment/ Plan:   Joan Wood is an 53 y.o. 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 Reconsulted 10/24 for worsening hyponatremia (Na down to 118)  Severe hyponatremia:  In the setting of decompensated cirrhosis, hypervolemia. -acute on chronic. Asymptomatic currently. Poor prognostic indicator. Fairly stable at 122-124 range. -volume status currently: hypervolemic (abd distension) -una <30, uosm 394, sosm 266 -on lasix great UOP; decr to 40mg  bid dosing. - cannot use tolvaptan w/ cirrhosis; NaCl tabs d/c'd bec of volume overload - not ideal but will try ure-na while carefully monitoring ammonia levels.   -if Na is downtrending despite aforementioned therapies, then will need 3% but best to avoid.  But would like to avoid.   Hypotension, chronic -midodrine  20mg  TID 10/24- continue -started octreotide  10/24-continue -BP low but stable  Hypokalemia -replete   AKI:  Baseline Cr 0.4, rose to 0.6 on 10/4, 1.1 10/5, 1.4 10/6.  She had CT w contrast 10/2. BP chronically low 80/50s -90/50s already on midodrine .  UA last 10/1 wasn't a clean catch - repeat 10/6 - looks bland.   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 Will cont to monitor. Cr remains stable -Poor candidate for dialysis should need arise,  fortunately renal function improved. Would not recommend it given high mortality risk with dialysis and cirrhosis   Decompensated Cirrhosis:  alcohol in etiology.  Ok for moderate volume paracentesis PRN + IV albumin  25g/L on cue to paracentesis.  -watch closely since starting salt tabs   Anemia:  ABLA in the setting of recent ulcers, severe esophagitis.  Transfuse as needed for hemoglobin.  Hemoglobin  stable 8.3 today   Appendicitis:  surgery recommended abx, high risk for surgery and no plans to proceed to OR.  If need for reimaging arises please try to avoid contrast. Per primary  Poor prognosis per charting, palliative care following.    Subjective:   Patient seen and examined bedside. Denies nausea/ sob but has h/a's.     Objective:   BP (!) 98/53 (BP Location: Left Arm)   Pulse 99   Temp 98.2 F (36.8 C) (Oral)   Resp 15   Ht 5' 2 (1.575 m)   Wt 53.6 kg   SpO2 98%   BMI 21.60 kg/m   Intake/Output Summary (Last 24 hours) at 11/12/2023 1048 Last data filed at 11/12/2023 0850 Gross per 24 hour  Intake 1200 ml  Output 3650 ml  Net -2450 ml   Weight change:   Physical Exam: Gen: chronically ill appearing, NAD HEENT: incteric sclera CVS: RRR Resp: CTA B/L, no w/r/r/c, unlabored, normal wob, speaking in full sentences Abd: soft, distended with +fluid shift, RLQ tenderness on palpation Ext: no edema b/l Les Neuro: awake, alert GU: +foley  Imaging: US  Paracentesis Result Date: 11/11/2023 INDICATION: 53 year old female history of decompensated cirrhosis and recurrent ascites. Request for therapeutic and diagnostic paracentesis. EXAM: ULTRASOUND GUIDED PARACENTESIS MEDICATIONS: 10 mL 1% lidocaine  COMPLICATIONS: None immediate. PROCEDURE: Informed written consent was obtained from the patient after a discussion of the risks, benefits and alternatives to treatment. A timeout was performed prior to the initiation of the procedure. Initial ultrasound scanning demonstrates a large amount of ascites within the right lower abdominal quadrant. The right lower abdomen was prepped and  draped in the usual sterile fashion. 1% lidocaine  was used for local anesthesia. Following this, a 19 gauge, 7-cm, Yueh catheter was introduced. An ultrasound image was saved for documentation purposes. The paracentesis was performed. The catheter was dislodged accidentally, ultrasound showed persistent  large volume ascites. Patient did not want to continue paracentesis. The catheter was removed and a dressing was applied. The patient tolerated the procedure well without immediate post procedural complication. FINDINGS: A total of approximately 650 mL of hazy yellow fluid was removed. Samples were sent to the laboratory as requested by the clinical team. IMPRESSION: Successful ultrasound-guided paracentesis yielding 650 mL of peritoneal fluid. IR will repeat paracentesis tomorrow per patient request. PLAN: The patient has required >/=2 paracenteses in a 30 day period and a formal evaluation by the [facility] Interventional Radiology Portal Hypertension [facility] has been arranged. Performed by: Aimee Han, PA-C. Electronically Signed   By: Juliene Balder M.D.   On: 11/11/2023 15:45     Labs: BMET Recent Labs  Lab 11/07/23 0210 11/07/23 0919 11/07/23 1456 11/07/23 2009 11/08/23 0154 11/08/23 0557 11/08/23 2243 11/09/23 0754 11/09/23 1142 11/09/23 1516 11/10/23 0502 11/11/23 0258 11/12/23 0252  NA 122*   < > 125*   < > 129*   < > 127* 124* 125* 125* 126* 124* 122*  K 2.7*  --  3.3*  --  3.5  --   --  4.7  --   --  4.3 2.6* 3.3*  CL 92*  --  90*  --  97*  --   --  93*  --   --  93* 91* 87*  CO2 23  --  21*  --  21*  --   --  21*  --   --  23 22 25   GLUCOSE 124*  --  120*  --  118*  --   --  88  --   --  104* 102* 122*  BUN 27*  --  21*  --  21*  --   --  25*  --   --  27* 20 15  CREATININE 0.60  --  0.61  --  0.55  --   --  0.62  --   --  0.69 0.59 0.52  CALCIUM  8.2*  --  8.0*  --  7.9*  --   --  8.2*  --   --  8.5* 7.9* 7.9*   < > = values in this interval not displayed.   CBC Recent Labs  Lab 11/09/23 0754 11/10/23 0502 11/10/23 1806 11/11/23 0258 11/12/23 0252  WBC 8.1 7.3  --  8.2 8.5  HGB 7.7* 6.8* 9.0* 8.8* 8.6*  HCT 22.2* 19.7* 25.7* 24.4* 23.8*  MCV 98.2 98.5  --  93.5 93.3  PLT 114* 118*  --  116* 116*    Medications:     sodium chloride    Intravenous Once   calcium   carbonate  1 tablet Oral TID WC   Chlorhexidine  Gluconate Cloth  6 each Topical Daily   feeding supplement  237 mL Oral TID BM   folic acid   1 mg Oral Daily   furosemide  60 mg Intravenous BID   gabapentin   300 mg Oral TID   lactulose  20 g Oral BID   lidocaine   2 patch Transdermal Q24H   lidocaine  (PF)  10 mL Other Once   midodrine   20 mg Oral TID WC   multivitamin with minerals  1 tablet Oral Daily   nicotine   21  mg Transdermal Daily   nutrition supplement (JUVEN)  1 packet Oral BID BM   octreotide   100 mcg Subcutaneous Q8H   pantoprazole   40 mg Oral BID   sucralfate   1 g Oral Q6H   thiamine   100 mg Oral Daily   urea  15 g Oral BID

## 2023-11-13 DIAGNOSIS — D62 Acute posthemorrhagic anemia: Secondary | ICD-10-CM | POA: Diagnosis not present

## 2023-11-13 DIAGNOSIS — K7460 Unspecified cirrhosis of liver: Secondary | ICD-10-CM | POA: Diagnosis not present

## 2023-11-13 DIAGNOSIS — K7290 Hepatic failure, unspecified without coma: Secondary | ICD-10-CM | POA: Diagnosis not present

## 2023-11-13 DIAGNOSIS — K922 Gastrointestinal hemorrhage, unspecified: Secondary | ICD-10-CM | POA: Diagnosis not present

## 2023-11-13 DIAGNOSIS — R112 Nausea with vomiting, unspecified: Secondary | ICD-10-CM | POA: Diagnosis not present

## 2023-11-13 LAB — CBC
HCT: 24.2 % — ABNORMAL LOW (ref 36.0–46.0)
Hemoglobin: 8.6 g/dL — ABNORMAL LOW (ref 12.0–15.0)
MCH: 33.5 pg (ref 26.0–34.0)
MCHC: 35.5 g/dL (ref 30.0–36.0)
MCV: 94.2 fL (ref 80.0–100.0)
Platelets: 122 K/uL — ABNORMAL LOW (ref 150–400)
RBC: 2.57 MIL/uL — ABNORMAL LOW (ref 3.87–5.11)
RDW: 17.4 % — ABNORMAL HIGH (ref 11.5–15.5)
WBC: 8.6 K/uL (ref 4.0–10.5)
nRBC: 0 % (ref 0.0–0.2)

## 2023-11-13 LAB — COMPREHENSIVE METABOLIC PANEL WITH GFR
ALT: 24 U/L (ref 0–44)
AST: 76 U/L — ABNORMAL HIGH (ref 15–41)
Albumin: 2.3 g/dL — ABNORMAL LOW (ref 3.5–5.0)
Alkaline Phosphatase: 57 U/L (ref 38–126)
Anion gap: 11 (ref 5–15)
BUN: 43 mg/dL — ABNORMAL HIGH (ref 6–20)
CO2: 27 mmol/L (ref 22–32)
Calcium: 8 mg/dL — ABNORMAL LOW (ref 8.9–10.3)
Chloride: 88 mmol/L — ABNORMAL LOW (ref 98–111)
Creatinine, Ser: 0.68 mg/dL (ref 0.44–1.00)
GFR, Estimated: >60 mL/min (ref >60)
Glucose, Bld: 106 mg/dL — ABNORMAL HIGH (ref 70–99)
Potassium: 2.5 mmol/L — CL (ref 3.5–5.1)
Sodium: 126 mmol/L — ABNORMAL LOW (ref 135–145)
Total Bilirubin: 2.7 mg/dL — ABNORMAL HIGH (ref 0.0–1.2)
Total Protein: 6.4 g/dL — ABNORMAL LOW (ref 6.5–8.1)

## 2023-11-13 LAB — MAGNESIUM: Magnesium: 1.5 mg/dL — ABNORMAL LOW (ref 1.7–2.4)

## 2023-11-13 LAB — PROTIME-INR
INR: 1.9 — ABNORMAL HIGH (ref 0.8–1.2)
Prothrombin Time: 22.7 s — ABNORMAL HIGH (ref 11.4–15.2)

## 2023-11-13 LAB — AMMONIA: Ammonia: 82 umol/L — ABNORMAL HIGH (ref 9–35)

## 2023-11-13 MED ORDER — POTASSIUM CHLORIDE 10 MEQ/100ML IV SOLN: 10.0000 meq | INTRAVENOUS | Status: DC

## 2023-11-13 MED ORDER — POTASSIUM CHLORIDE CRYS ER 20 MEQ PO TBCR
60.0000 meq | EXTENDED_RELEASE_TABLET | ORAL | Status: AC
  Administered 2023-11-13 (×2): 60 meq via ORAL
  Filled 2023-11-13 (×2): qty 3

## 2023-11-13 MED ORDER — POTASSIUM CHLORIDE CRYS ER 20 MEQ PO TBCR
40.0000 meq | EXTENDED_RELEASE_TABLET | ORAL | Status: AC
  Administered 2023-11-13 (×2): 40 meq via ORAL
  Filled 2023-11-13 (×2): qty 2

## 2023-11-13 MED ORDER — MAGNESIUM SULFATE 2 GM/50ML IV SOLN
2.0000 g | Freq: Once | INTRAVENOUS | Status: AC
  Administered 2023-11-13: 2 g via INTRAVENOUS
  Filled 2023-11-13: qty 50

## 2023-11-13 NOTE — Progress Notes (Signed)
 Progress Note   Patient: Joan Wood FMW:[id number] DOB: Mar 06, 1970 DOA: 10/03/2023     41 DOS: the patient was seen and examined on 11/13/2023   Brief hospital course: 53 y.o. female with a history of decompensated liver cirrhosis, PUD, gastric ulcer perforation status post ex lap, GERD, pancreatitis, obstructive sleep apnea. Patient presented secondary to nausea, coffee-ground emesis, bloody diarrhea with concern for upper GI bleed. 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 Hospitalization complicated by hepatic encephalopathy treated with lactulose and rifaximin. Patient also requiring recurrent paracenteses for her recurrent ascites.   Assessment and Plan: Decompensated alcoholic cirrhosis with ascites Patient treated empirically with Ceftriaxone  for possible SBP, although ascites fluid cell count not consistent with diagnosis. Palliative care consulted for ongoing goals of care discussions. -Continue to treat hepatic encephalopathy -s/p repeat therapeutic paracentesis 11/3 yielding 750cc ascites. Fluid analysis not suggestive of SBP   Hepatic encephalopathy Improved with lactulose and Rifaximin. Patient's insurance will not cover Rifaximin on discharge. -Reported over 6 BM over the weekend, thus decreased lactulose and stopped rifaximin -Pt was much more lethargic after lactulose reduced and rifaximin stopped -Mentation now much better with lactulose to 20g TID and resumption of rifiaximin   Transient alteration of awareness Patient developed significant change in mental status and was difficult to arose. PCCM was consulted for concern that patient would continue to decompensate. Patient spontaneously improved about 30 minutes later. Unclear etiology. She does not appear to have any post-ictal type presentation. She had an elevated  temperature; no leukocytosis. Also complicated by patient's hyponatremia. Blood cultures with no growth to date. -More awake and conversant this AM   Hypoalbuminemia Secondary to underlying liver disease.   Thrombocytopenia Secondary to underlying liver disease.    AKI Presumed secondary to ATN from contrast. Nephrology was consulted for management. Renal function improved. Patient is not an HD candidate.   Appendicitis General surgery consulted. Patient high-risk for surgical management with recommendation for conservative management. Patient completed antibiotic regimen.   Chronic hypotension Worse this morning. Associated drop in hemoglobin, which may be etiology. Patient has received albumin  IV intermittently. -Continue midodrine  15 mg TID   Chronic hyponatremia Patient's baseline appears to be around 125 -128.  Sodium has significantly worsened. Nephrology consulted on 10/24. Lasix started with initial improvement, however sodium is back down to 118 today. -Continue fluid restriction (1,200 mL) -Nephrology recommendations (10/27): Lasix IV BID -Na improved to 131. Main issue is drinking too much fluids per Nephro -Nephro has since signed off 10/30 -Na down to 122. Main issue is hypervolemia as pt had not been adhering to vol restriction -Na now up to 126 this AM   Acute upper GI bleed Acute esophageal necrosis Acute blood loss anemia Patient seen and evaluated by gastroenterology. Upper GI endoscopy performed on 9/25 with evidence of grade D esophagitis in addition to concern for acute esophageal necrosis. Non-bleeding gastric ulcer noted with sutures. For esophageal necrosis, GI recommended carafate  for two weeks in addition to Protonix  BID and outpatient GI follow-up. Patient with recurrent anemia on 10/24 without obvious hemorrhaging noted and again on 11/1 -Total received 4 units PRBC's this admit   Abdominal pain Presumed secondary to abdominal distension from ascites.   -improved after paracentesis 11/3   Lower extremity edema VTE ruled out. Likely related to hypoalbuminemia from liver disease.   Hypervolemic hyponatremia.   Hypomagnesemia Low. Replaced   Hypokalemia Remains  low, replaced   Tobacco abuse -Continue nicotine  patch   Alcohol abuse Noted.   Severe malnutrition -Dietitian recommendations (10/21): Continue current diet as ordered Encourage PO intake Continue ordering assistance Continue Ensure Plus High Protein po TID, each supplement provides 350 kcal and 20 grams of protein. 1 packet Juven BID, each packet provides 95 calories, 2.5 grams of protein (collagen),  to support wound healing Continue MVI with minerals daily, thiamine  and folic acid  for alcohol use history   Goals of care Goals noted for rehab at SNF. TOC consulted and following   Pressure injury Mid sacrum. Unclear if present on admission.      Subjective: Reports feeling better today. More alert and conversant  Physical Exam: Vitals:   11/13/23 0445 11/13/23 0715 11/13/23 1111 11/13/23 1529  BP:  94/65 95/63 (!) 90/56  Pulse:  88 88 83  Resp:  15 16 16   Temp:  98.5 F (36.9 C) 98.2 F (36.8 C) 98.1 F (36.7 C)  TempSrc:  Oral Oral Oral  SpO2:  100% 100% 100%  Weight: 52.5 kg     Height:       General exam: Conversant, in no acute distress Respiratory system: normal chest rise, clear, no audible wheezing Cardiovascular system: regular rhythm, s1-s2 Gastrointestinal system: Nondistended, nontender Central nervous system: No seizures, no tremors Extremities: No cyanosis, no joint deformities Skin: No rashes, no pallor Psychiatry: Affect normal // mood seems normal  Data Reviewed:  Labs reviewed: Na 126, K 2.5, Cr 0.68, WBC 8.6, Hgb 8.6, Plts 122  Family Communication: Pt in room, family not at bedside  Disposition: Status is: Inpatient Remains inpatient appropriate because: severity of illness  Planned Discharge Destination: Skilled  nursing facility    Author: Garnette Pelt, MD 11/13/2023 3:36 PM  For on call review [email or web address].

## 2023-11-13 NOTE — Plan of Care (Signed)
 Patient ID: Joan Wood, female   DOB: October 05, 1970, 53 y.o.   MRN: [id number]    Progress Note from the Palliative Medicine Team at [facility]   Patient Name: Joan Wood        Date: 11/13/2023 DOB: Aug 11, 1970  Age: 53 y.o. MRN#: [id number] Attending Physician: Cindy Garnette POUR, MD Primary Care Physician: Edman Meade PEDLAR, FNP Admit Date: 10/03/2023   Brief visit to bedside .   Patient continues with hope for improvement, she is open to all offered and available medical interventions to prolong life. Continue to work with [facility] for transition plan.   Hope is for patient to be closer to her children in [place] area.   PMT will shadow for needs.  [name] is high risk for decompensation.  Discussed with Dr Cindy and [facility]  No charge  Ronal Plants NP  Palliative Medicine Team Team Phone # [phone] Pager [phone]

## 2023-11-13 NOTE — Progress Notes (Signed)
 [facility] Progress Note    Assessment/ Plan:   Joan Wood is an 53 y.o. 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 Reconsulted 10/24 for worsening hyponatremia (Na down to 118)  Severe hyponatremia:  In the setting of decompensated cirrhosis, hypervolemia. -acute on chronic. Asymptomatic currently. Poor prognostic indicator. Fairly stable at 122-124 range. -volume status currently: hypervolemic (abd distension) -una <30, uosm 394, sosm 266 -on lasix great UOP; decr'd to 40mg  bid dosing. - cannot use tolvaptan w/ cirrhosis; NaCl tabs d/c'd bec of volume overload - not ideal but gave 2 doses of ure-na on 11/3; I would not give anymore. RN doing a great job with fluid restriction.  Signing off at this time; please reconsult as needed.   -if Na is downtrending despite aforementioned therapies, then will need 3% but best to avoid.  But would like to avoid.   Hypotension, chronic -midodrine  20mg  TID 10/24- continue -started octreotide  10/24-continue  -BP low but stable  Hypokalemia -replete today   AKI:  Baseline Cr 0.4, rose to 0.6 on 10/4, 1.1 10/5, 1.4 10/6.  She had CT w contrast 10/2. BP chronically low 80/50s -90/50s already on midodrine .  UA last 10/1 wasn't a clean catch - repeat 10/6 - looks bland.   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 Will cont to monitor. Cr remains stable -Poor candidate for dialysis should need arise,  fortunately renal function improved. Would not recommend it given high mortality risk with dialysis and cirrhosis   Decompensated Cirrhosis:  alcohol in etiology.  Ok for moderate volume paracentesis PRN + IV albumin  25g/L on cue to paracentesis.  -watch closely since starting salt tabs    Anemia:  ABLA in the setting of recent ulcers, severe esophagitis.  Transfuse as needed for hemoglobin.  Hemoglobin stable 8.3 today   Appendicitis:  surgery recommended abx, high risk for surgery and no plans to proceed to OR.  If need for reimaging arises please try to avoid contrast. Per primary  Poor prognosis per charting, palliative care following.    Subjective:   Patient seen and examined bedside. Some  nausea this am but denies sob      Objective:   BP 94/65 (BP Location: Left Arm)   Pulse 88   Temp 98.5 F (36.9 C) (Oral)   Resp 15   Ht 5' 2 (1.575 m)   Wt 52.5 kg   SpO2 100%   BMI 21.17 kg/m   Intake/Output Summary (Last 24 hours) at 11/13/2023 1036 Last data filed at 11/13/2023 9470 Gross per 24 hour  Intake 774 ml  Output 2500 ml  Net -1726 ml   Weight change: -1.07 kg  Physical Exam: Gen: chronically ill appearing, NAD HEENT: incteric sclera CVS: RRR Resp: CTA B/L, no w/r/r/c, unlabored, normal wob, speaking in full sentences Abd: soft, distended with +fluid shift, RLQ tenderness on palpation Ext: no edema b/l Les Neuro: awake, alert GU: +foley  Imaging: IR Paracentesis Result Date: 11/12/2023 INDICATION: History of decompensated cirrhosis with recurrent ascites. Consult for therapeutic paracentesis. EXAM: ULTRASOUND GUIDED THERAPEUTIC PARACENTESIS MEDICATIONS: 6 mL 1% lidocaine  COMPLICATIONS: None immediate. PROCEDURE: Informed written consent was obtained from the patient after a discussion of the risks, benefits and alternatives to treatment. A timeout was performed prior to the initiation of  the procedure. Initial ultrasound scanning demonstrates a small amount of ascites within the right lower abdominal quadrant. The right lower abdomen was prepped and draped in the usual sterile fashion. 1% lidocaine  was used for local anesthesia. Following this, a 19 gauge, 7-cm, Yueh catheter was introduced. An ultrasound image was saved for documentation purposes.  The paracentesis was performed. The catheter was removed and a dressing was applied. The patient tolerated the procedure well without immediate post procedural complication. FINDINGS: A total of approximately 750 mL of mildly hazy yellow fluid was removed. IMPRESSION: Successful ultrasound-guided paracentesis yielding 750 mL of peritoneal fluid. Performed by: Wyatt Pommier, PA-C Electronically Signed   By: JONETTA Faes M.D.   On: 11/12/2023 15:55   US  Paracentesis Result Date: 11/11/2023 INDICATION: 53 year old female history of decompensated cirrhosis and recurrent ascites. Request for therapeutic and diagnostic paracentesis. EXAM: ULTRASOUND GUIDED PARACENTESIS MEDICATIONS: 10 mL 1% lidocaine  COMPLICATIONS: None immediate. PROCEDURE: Informed written consent was obtained from the patient after a discussion of the risks, benefits and alternatives to treatment. A timeout was performed prior to the initiation of the procedure. Initial ultrasound scanning demonstrates a large amount of ascites within the right lower abdominal quadrant. The right lower abdomen was prepped and draped in the usual sterile fashion. 1% lidocaine  was used for local anesthesia. Following this, a 19 gauge, 7-cm, Yueh catheter was introduced. An ultrasound image was saved for documentation purposes. The paracentesis was performed. The catheter was dislodged accidentally, ultrasound showed persistent large volume ascites. Patient did not want to continue paracentesis. The catheter was removed and a dressing was applied. The patient tolerated the procedure well without immediate post procedural complication. FINDINGS: A total of approximately 650 mL of hazy yellow fluid was removed. Samples were sent to the laboratory as requested by the clinical team. IMPRESSION: Successful ultrasound-guided paracentesis yielding 650 mL of peritoneal fluid. IR will repeat paracentesis tomorrow per patient request. PLAN: The patient has required >/=2  paracenteses in a 30 day period and a formal evaluation by the [facility] Interventional Radiology Portal Hypertension Clinic has been arranged. Performed by: Aimee Han, PA-C. Electronically Signed   By: Juliene Balder M.D.   On: 11/11/2023 15:45     Labs: BMET Recent Labs  Lab 11/07/23 1456 11/07/23 2009 11/08/23 0154 11/08/23 0557 11/09/23 0754 11/09/23 1142 11/09/23 1516 11/10/23 0502 11/11/23 0258 11/12/23 0252 11/13/23 0153  NA 125*   < > 129*   < > 124* 125* 125* 126* 124* 122* 126*  K 3.3*  --  3.5  --  4.7  --   --  4.3 2.6* 3.3* 2.5*  CL 90*  --  97*  --  93*  --   --  93* 91* 87* 88*  CO2 21*  --  21*  --  21*  --   --  23 22 25 27   GLUCOSE 120*  --  118*  --  88  --   --  104* 102* 122* 106*  BUN 21*  --  21*  --  25*  --   --  27* 20 15 43*  CREATININE 0.61  --  0.55  --  0.62  --   --  0.69 0.59 0.52 0.68  CALCIUM  8.0*  --  7.9*  --  8.2*  --   --  8.5* 7.9* 7.9* 8.0*   < > = values in this interval not displayed.   CBC Recent Labs  Lab 11/10/23 0502 11/10/23 1806 11/11/23 0258 11/12/23 0252 11/13/23 0153  WBC 7.3  --  8.2 8.5 8.6  HGB 6.8* 9.0* 8.8* 8.6* 8.6*  HCT 19.7* 25.7* 24.4* 23.8* 24.2*  MCV 98.5  --  93.5 93.3 94.2  PLT 118*  --  116* 116* 122*    Medications:     sodium chloride    Intravenous Once   calcium  carbonate  1 tablet Oral TID WC   Chlorhexidine  Gluconate Cloth  6 each Topical Daily   feeding supplement  237 mL Oral TID BM   folic acid   1 mg Oral Daily   furosemide  40 mg Intravenous BID   gabapentin   300 mg Oral TID   lactulose  20 g Oral TID   lidocaine   2 patch Transdermal Q24H   lidocaine  (PF)  10 mL Other Once   midodrine   20 mg Oral TID WC   multivitamin with minerals  1 tablet Oral Daily   nutrition supplement (JUVEN)  1 packet Oral BID BM   octreotide   100 mcg Subcutaneous Q8H   pantoprazole   40 mg Oral BID   potassium chloride   60 mEq Oral Q4H   rifaximin  550 mg Oral BID   thiamine   100 mg Oral Daily

## 2023-11-13 NOTE — TOC Progression Note (Signed)
 Transition of Care [facility]) - Progression Note    Patient Details  Name: LATANJA LEHENBAUER MRN: [id number] Date of Birth: Oct 03, 1970  [facility] CM/SW Contact  [name], [name] Phone Number: 11/13/2023, 3:32 PM  Clinical Narrative:   CSW made the following calls for placement: - [facility]: Left another voicemail for Marval in admissions - [facility]: Left a engineer, technical sales for John in admissions - [facility]: Spoke with Niels, they do not have any Medicaid beds at this time - [facility]: Spoke with Admissions, they do not take patient's insurance - [facility]: Spoke with Admissions, they do not have any Medicaid beds right now but said they would still look at patient's referral; fax number [phone].  CSW completed FL2 and awaiting MD signature to fax to [facility]. CSW spoke with patient's son, [name], and provided brief update that no bed has been found yet. CSW to follow.    Expected Discharge Plan: Skilled Nursing Facility Barriers to Discharge: Homeless with medical needs, Inadequate or no insurance, Continued Medical Work up, Unsafe home situation               Expected Discharge Plan and Services In-house Referral: Clinical Social Work Discharge Planning Services: CM Consult Post Acute Care Choice: Home Health Living arrangements for the past 2 months: Single Family Home                 DME Arranged: Walker rolling   Date DME Agency Contacted: 10/08/23   Representative spoke with at DME Agency: [name]             Social Drivers of Health (SDOH) Interventions SDOH Screenings   Food Insecurity: No Food Insecurity (10/03/2023)  Housing: Low Risk  (10/03/2023)  Transportation Needs: No Transportation Needs (10/03/2023)  Recent Concern: Transportation Needs - Unmet Transportation Needs (08/20/2023)  Utilities: Not At Risk (10/03/2023)  Depression (PHQ2-9): High  Risk (07/30/2023)  Social Connections: Socially Isolated (10/03/2023)  Tobacco Use: High Risk (10/04/2023)    Readmission Risk Interventions    10/04/2023    9:02 AM 09/03/2023   12:31 PM 09/02/2023    9:01 AM  Readmission Risk Prevention Plan  Transportation Screening Complete Complete Complete  PCP or Specialist Appt within 3-5 Days   Complete  HRI or Home Care Consult   Complete  Social Work Consult for Recovery Care Planning/Counseling   Complete  Palliative Care Screening   Not Applicable  Medication Review Oceanographer) Complete Complete Complete  HRI or Home Care Consult Complete Complete   SW Recovery Care/Counseling Consult Complete Complete   Palliative Care Screening Not Applicable Not Applicable   Skilled Nursing Facility Not Applicable Patient Refused

## 2023-11-13 NOTE — Plan of Care (Signed)
  Problem: Health Behavior/Discharge Planning: Goal: Ability to manage health-related needs will improve Outcome: Progressing   Problem: Clinical Measurements: Goal: Will remain free from infection Outcome: Progressing   Problem: Clinical Measurements: Goal: Diagnostic test results will improve Outcome: Progressing   Problem: Activity: Goal: Risk for activity intolerance will decrease Outcome: Progressing   Problem: Nutrition: Goal: Adequate nutrition will be maintained Outcome: Progressing   Problem: Coping: Goal: Level of anxiety will decrease Outcome: Progressing   Problem: Elimination: Goal: Will not experience complications related to bowel motility Outcome: Progressing   Problem: Elimination: Goal: Will not experience complications related to urinary retention Outcome: Progressing   Problem: Pain Managment: Goal: General experience of comfort will improve and/or be controlled Outcome: Progressing   Problem: Safety: Goal: Ability to remain free from injury will improve Outcome: Progressing   Problem: Skin Integrity: Goal: Risk for impaired skin integrity will decrease Outcome: Progressing

## 2023-11-13 NOTE — Plan of Care (Signed)

## 2023-11-13 NOTE — TOC Progression Note (Signed)
 Transition of Care [facility]) - Progression Note    Patient Details  Name: Joan Wood MRN: [id number] Date of Birth: [birth year]  Transition [facility]) CM/SW Contact  Joan Wood, Joan Wood Phone Number: 11/13/2023, 3:38 PM  Clinical Narrative:   CSW met with patient per MD request. Patient reported that someone had called her this morning, she thinks, and woke her up from her sleep to tell her that she had a bed available at [facility]. CSW asked about details, where they were from, who it was, or a phone number, and patient said she did not get any of that because she had just woken up, but she thinks they told her that they would call her back tomorrow. CSW to check back with patient to see if she gets another call about a bed, but sounds like patient was likely dreaming. Patient again reiterated agreement with placement near her kids, appreciative of CSW assistance. CSW updated that no bed is available anywhere yet. CSW to follow.    Expected Discharge Plan: Skilled Nursing Facility Barriers to Discharge: Homeless with medical needs, Inadequate or no insurance, Continued Medical Work up, Unsafe home situation               Expected Discharge Plan and Services In-house Referral: Clinical Social Work Discharge Planning Services: CM Consult Post Acute Care Choice: Home Health Living arrangements for the past 2 months: Single Family Home                 DME Arranged: Walker rolling   Date DME Agency Contacted: 10/08/23   Representative spoke with at DME Agency: [name]             Social Drivers of Health (SDOH) Interventions SDOH Screenings   Food Insecurity: No Food Insecurity (10/03/2023)  Housing: Low Risk  (10/03/2023)  Transportation Needs: No Transportation Needs (10/03/2023)  Recent Concern: Transportation Needs - Unmet Transportation Needs (08/20/2023)  Utilities: Not At Risk (10/03/2023)  Depression (PHQ2-9): High Risk (07/30/2023)  Social Connections: Socially  Isolated (10/03/2023)  Tobacco Use: High Risk (10/04/2023)    Readmission Risk Interventions    10/04/2023    9:02 AM 09/03/2023   12:31 PM 09/02/2023    9:01 AM  Readmission Risk Prevention Plan  Transportation Screening Complete Complete Complete  PCP or Specialist Appt within 3-5 Days   Complete  HRI or Home Care Consult   Complete  Social Work Consult for Recovery Care Planning/Counseling   Complete  Palliative Care Screening   Not Applicable  Medication Review Oceanographer) Complete Complete Complete  HRI or Home Care Consult Complete Complete   SW Recovery Care/Counseling Consult Complete Complete   Palliative Care Screening Not Applicable Not Applicable   Skilled Nursing Facility Not Applicable Patient Refused

## 2023-11-13 NOTE — Progress Notes (Signed)
 [facility] night cross cover note:   I was notified by the patient's RN of the patient's morning potassium level of 2.5, with creatinine this morning noted to be 0.68.  It appears that she is undergoing IV diuresis with Lasix.  Additionally, her magnesium  level this morning is low at 1.5.  I subsequently ordered potassium chloride  40 meq p.o. every 4 hours x 2 occurrences as well as magnesium  sulfate 2 g IV over 2 hours.     Eva Pore, DO Hospitalist

## 2023-11-13 NOTE — Progress Notes (Signed)
 Occupational Therapy Treatment Patient Details Name: Joan Wood MRN: [id number] DOB: [birth year] Today's Date: 11/13/2023   History of present illness 53 y.o. female presents to [facility] hospital on 10/03/2023 with nausea/vomiting and bloody diarrhea. Pt underwent upper EGD on 9/25, concerning for possible acute esophageal necrosis. Pt also underwent paracentesis on 9/25 and 10/6. PMH includes decompensated cirrhosis, PUD, GERD, pancreatitis, OSA.   OT comments  Patient demonstrating good gains with bed mobility, transfers, and self care.  Patient able to get to Eob with CGA and ambulate to sink for grooming tasks. Patient attempted to perform in standing but quickly fatigued and completed in standing. Patient performed transfer training to address toilet transfers with CGA before asking to return to supine.  Patient will benefit from continued inpatient follow up therapy, <3 hours/day.  Acute OT to continue to follow to address established goals to facilitate DC to next venue of care.        If plan is discharge home, recommend the following:  A little help with walking and/or transfers;A lot of help with bathing/dressing/bathroom;Assistance with cooking/housework;Direct supervision/assist for medications management;Assist for transportation;Supervision due to cognitive status   Equipment Recommendations  BSC/3in1    Recommendations for Other Services      Precautions / Restrictions Precautions Precautions: Fall Recall of Precautions/Restrictions: Impaired Precaution/Restrictions Comments: orthostatic, foley       Mobility Bed Mobility Overal bed mobility: Needs Assistance Bed Mobility: Rolling, Sidelying to Sit, Sit to Supine Rolling: Supervision Sidelying to sit: Contact guard assist   Sit to supine: Contact guard assist   General bed mobility comments: min assist to scoot while in bed to get to center    Transfers Overall transfer level: Needs assistance Equipment used:  Rolling walker (2 wheels) Transfers: Sit to/from Stand, Bed to chair/wheelchair/BSC Sit to Stand: Contact guard assist     Step pivot transfers: Contact guard assist     General transfer comment: ambulated to sink and recliner with CGA     Balance Overall balance assessment: Needs assistance Sitting-balance support: No upper extremity supported, Feet supported Sitting balance-Leahy Scale: Good Sitting balance - Comments: EOB   Standing balance support: Bilateral upper extremity supported, Reliant on assistive device for balance Standing balance-Leahy Scale: Poor Standing balance comment: reliant on UE support                           ADL either performed or assessed with clinical judgement   ADL Overall ADL's : Needs assistance/impaired     Grooming: Wash/dry face;Wash/dry hands;Oral care;Contact guard assist;Standing;Set up;Sitting Grooming Details (indicate cue type and reason): began grooming tasks in standing but completed in sitting due to fatigue                 Toilet Transfer: Contact guard assist;Rolling walker (2 wheels) Toilet Transfer Details (indicate cue type and reason): simulated                Extremity/Trunk Assessment              Vision       Perception     Praxis     Communication Communication Communication: No apparent difficulties   Cognition Arousal: Alert Behavior During Therapy: Impulsive Cognition: Cognition impaired     Awareness: Intellectual awareness impaired Memory impairment (select all impairments): Short-term memory, Working memory Attention impairment (select first level of impairment): Sustained attention Executive functioning impairment (select all impairments): Organization, Reasoning, Problem solving, Initiation  Following commands: Impaired Following commands impaired: Follows multi-step commands inconsistently      Cueing   Cueing Techniques: Verbal cues,  Visual cues  Exercises      Shoulder Instructions       General Comments      Pertinent Vitals/ Pain       Pain Assessment Pain Assessment: Faces Faces Pain Scale: Hurts little more Pain Location: suprapubic Pain Descriptors / Indicators: Grimacing Pain Intervention(s): Limited activity within patient's tolerance, Monitored during session, Repositioned  Home Living                                          Prior Functioning/Environment              Frequency  Min 2X/week        Progress Toward Goals  OT Goals(current goals can now be found in the care plan section)  Progress towards OT goals: Progressing toward goals  Acute Rehab OT Goals Patient Stated Goal: to go to rehab facility closer to he children OT Goal Formulation: With patient Time For Goal Achievement: 11/20/23 Potential to Achieve Goals: Fair ADL Goals Pt Will Perform Grooming: with contact guard assist;standing Pt Will Perform Lower Body Dressing: with supervision;sitting/lateral leans;sit to/from stand;with adaptive equipment Pt Will Transfer to Toilet: with min assist;bedside commode Pt Will Perform Toileting - Clothing Manipulation and hygiene: with supervision;sit to/from stand;sitting/lateral leans Pt/caregiver will Perform Home Exercise Program: Both right and left upper extremity;With written HEP provided;With theraband  Plan      Co-evaluation                 AM-PAC OT 6 Clicks Daily Activity     Outcome Measure   Help from another person eating meals?: None Help from another person taking care of personal grooming?: A Little Help from another person toileting, which includes using toliet, bedpan, or urinal?: A Lot Help from another person bathing (including washing, rinsing, drying)?: A Lot Help from another person to put on and taking off regular upper body clothing?: A Lot Help from another person to put on and taking off regular lower body clothing?: A  Little 6 Click Score: 16    End of Session Equipment Utilized During Treatment: Gait belt;Rolling walker (2 wheels)  OT Visit Diagnosis: Other abnormalities of gait and mobility (R26.89);Muscle weakness (generalized) (M62.81);Pain;Other symptoms and signs involving cognitive function   Activity Tolerance Patient tolerated treatment well   Patient Left in bed;with call bell/phone within reach;with bed alarm set   Nurse Communication Mobility status        Time: 1010-1040 OT Time Calculation (min): 30 min  Charges: OT General Charges $OT Visit: 1 Visit OT Treatments $Self Care/Home Management : 8-22 mins $Therapeutic Activity: 8-22 mins  Dick Laine, OTA Acute Rehabilitation Services  Office [phone]   Jeb LITTIE Laine 11/13/2023, 1:46 PM

## 2023-11-14 DIAGNOSIS — K7031 Alcoholic cirrhosis of liver with ascites: Secondary | ICD-10-CM | POA: Diagnosis not present

## 2023-11-14 DIAGNOSIS — D62 Acute posthemorrhagic anemia: Secondary | ICD-10-CM | POA: Diagnosis not present

## 2023-11-14 DIAGNOSIS — K7290 Hepatic failure, unspecified without coma: Secondary | ICD-10-CM | POA: Diagnosis not present

## 2023-11-14 LAB — COMPREHENSIVE METABOLIC PANEL WITH GFR
ALT: 23 U/L (ref 0–44)
AST: 71 U/L — ABNORMAL HIGH (ref 15–41)
Albumin: 2.3 g/dL — ABNORMAL LOW (ref 3.5–5.0)
Alkaline Phosphatase: 51 U/L (ref 38–126)
Anion gap: 9 (ref 5–15)
BUN: 22 mg/dL — ABNORMAL HIGH (ref 6–20)
CO2: 27 mmol/L (ref 22–32)
Calcium: 8 mg/dL — ABNORMAL LOW (ref 8.9–10.3)
Chloride: 89 mmol/L — ABNORMAL LOW (ref 98–111)
Creatinine, Ser: 0.5 mg/dL (ref 0.44–1.00)
GFR, Estimated: >60 mL/min (ref >60)
Glucose, Bld: 103 mg/dL — ABNORMAL HIGH (ref 70–99)
Potassium: 4.3 mmol/L (ref 3.5–5.1)
Sodium: 125 mmol/L — ABNORMAL LOW (ref 135–145)
Total Bilirubin: 2.5 mg/dL — ABNORMAL HIGH (ref 0.0–1.2)
Total Protein: 6.2 g/dL — ABNORMAL LOW (ref 6.5–8.1)

## 2023-11-14 LAB — CBC
HCT: 25.7 % — ABNORMAL LOW (ref 36.0–46.0)
Hemoglobin: 9.1 g/dL — ABNORMAL LOW (ref 12.0–15.0)
MCH: 33.7 pg (ref 26.0–34.0)
MCHC: 35.4 g/dL (ref 30.0–36.0)
MCV: 95.2 fL (ref 80.0–100.0)
Platelets: 130 K/uL — ABNORMAL LOW (ref 150–400)
RBC: 2.7 MIL/uL — ABNORMAL LOW (ref 3.87–5.11)
RDW: 17.4 % — ABNORMAL HIGH (ref 11.5–15.5)
WBC: 7 K/uL (ref 4.0–10.5)
nRBC: 0 % (ref 0.0–0.2)

## 2023-11-14 LAB — PROTIME-INR
INR: 1.9 — ABNORMAL HIGH (ref 0.8–1.2)
Prothrombin Time: 23 s — ABNORMAL HIGH (ref 11.4–15.2)

## 2023-11-14 NOTE — Progress Notes (Signed)
 [facility] night cross cover note:   Regarding this patient admitted with decompensated alcoholic cirrhosis with ascites, I was notified by the patient's RN  of this patient's most recent BP of 89/52.  RN confirms that the patient is resting comfortably at the time of his most recent blood pressure.  This blood pressure appears similar to blood pressures throughout the day, with systolic blood pressures over that timeframe noted to be in the low to mid 90s mmHg. additional vital signs at this time are notable for the following: Afebrile; heart rates in the 80s; respiratory rate 15, and oxygen saturation 98 to 100% on room air.   Given that the blood pressures appears stable/consistent with BP's throughout the day in this patient who is currently asymptomatic, will continue monitor ensuing BP trend, without additional new orders at this time from standpoint.      Eva Pore, DO Hospitalist

## 2023-11-14 NOTE — Progress Notes (Signed)
 [facility]  PROGRESS NOTE  Joan Wood FMW:[id number] DOB: [birth year] DOA: 10/03/2023 PCP: Edman Meade PEDLAR, FNP   Brief HPI:   53 y.o. female with a history of decompensated liver cirrhosis, PUD, gastric ulcer perforation status post ex lap, GERD, pancreatitis, obstructive sleep apnea. Patient presented secondary to nausea, coffee-ground emesis, bloody diarrhea with concern for upper GI bleed. 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 Hospitalization complicated by hepatic encephalopathy treated with lactulose and rifaximin. Patient also requiring recurrent paracenteses for her recurrent ascites.       Assessment/Plan:   Decompensated alcoholic cirrhosis with ascites Patient treated empirically with Ceftriaxone  for possible SBP, although ascites fluid cell count not consistent with diagnosis. Palliative care consulted for ongoing goals of care discussions. -Continue to treat hepatic encephalopathy -s/p repeat therapeutic paracentesis 11/3 yielding 750cc ascites. Fluid analysis not suggestive of SBP   Hepatic encephalopathy Improved with lactulose and Rifaximin. Patient's insurance will not cover Rifaximin on discharge. -Reported over 6 BM over the weekend, thus decreased lactulose and stopped rifaximin -Pt was much more lethargic after lactulose reduced and rifaximin stopped -Mentation now much better with lactulose to 20g TID and resumption of rifiaximin   Transient alteration of awareness Patient developed significant change in mental status and was difficult to arose. PCCM was consulted for concern that patient would continue to decompensate. Patient spontaneously improved about 30 minutes later. Unclear etiology. She does not appear to have any post-ictal type presentation. She had an elevated temperature; no leukocytosis.  Also complicated by patient's hyponatremia. Blood cultures with no growth to date.    Hypoalbuminemia Secondary to underlying liver disease.   Thrombocytopenia Secondary to underlying liver disease.    AKI Presumed secondary to ATN from contrast. Nephrology was consulted for management. Renal function improved. Patient is not an HD candidate.   Appendicitis General surgery consulted. Patient high-risk for surgical management with recommendation for conservative management. Patient completed antibiotic regimen.   Chronic hypotension Worse this morning. Associated drop in hemoglobin, which may be etiology. Patient has received albumin  IV intermittently. -Continue midodrine  15 mg TID   Chronic hyponatremia Patient's baseline appears to be around 125 -128.  Sodium has significantly worsened. Nephrology consulted on 10/24. Lasix started with initial improvement, however sodium is back down to 118 today. -Continue fluid restriction (1,200 mL) -Nephrology recommendations (10/27): Lasix IV BID -Na improved to 131. Main issue is drinking too much fluids per Nephro -Nephro has since signed off 10/30 -Na down to 122. Main issue is hypervolemia as pt had not been adhering to vol restriction -Na now up to 126 this AM   Acute upper GI bleed Acute esophageal necrosis Acute blood loss anemia Patient seen and evaluated by gastroenterology. Upper GI endoscopy performed on 9/25 with evidence of grade D esophagitis in addition to concern for acute esophageal necrosis. Non-bleeding gastric ulcer noted with sutures. For esophageal necrosis, GI recommended carafate  for two weeks in addition to Protonix  BID and outpatient GI follow-up. Patient with recurrent anemia on 10/24 without obvious hemorrhaging noted and again on 11/1 -Total received 4 units PRBC's this admit   Abdominal pain Presumed secondary to abdominal distension from ascites.  -improved after paracentesis 11/3   Lower extremity edema VTE  ruled out. Likely related to hypoalbuminemia from liver disease.   Hypervolemic hyponatremia.   Hypomagnesemia Low. Replaced   Hypokalemia Remains low, replaced   Tobacco abuse -Continue nicotine   patch   Alcohol abuse Noted.   Severe malnutrition -Dietitian recommendations (10/21): Continue current diet as ordered Encourage PO intake Continue ordering assistance Continue Ensure Plus High Protein po TID, each supplement provides 350 kcal and 20 grams of protein. 1 packet Juven BID, each packet provides 95 calories, 2.5 grams of protein (collagen),  to support wound healing Continue MVI with minerals daily, thiamine  and folic acid  for alcohol use history   Goals of care Goals noted for rehab at SNF. TOC consulted and following   Pressure injury Mid sacrum. Unclear if present on admission.          DVT prophylaxis: SCDs  Medications     sodium chloride    Intravenous Once   calcium  carbonate  1 tablet Oral TID WC   Chlorhexidine  Gluconate Cloth  6 each Topical Daily   feeding supplement  237 mL Oral TID BM   folic acid   1 mg Oral Daily   furosemide  40 mg Intravenous BID   gabapentin   300 mg Oral TID   lactulose  20 g Oral TID   lidocaine   2 patch Transdermal Q24H   lidocaine  (PF)  10 mL Other Once   midodrine   20 mg Oral TID WC   multivitamin with minerals  1 tablet Oral Daily   nutrition supplement (JUVEN)  1 packet Oral BID BM   octreotide   100 mcg Subcutaneous Q8H   pantoprazole   40 mg Oral BID   rifaximin  550 mg Oral BID   thiamine   100 mg Oral Daily     Data Reviewed:   CBG:  No results for input(s): GLUCAP in the last 168 hours.  SpO2: 98 %    Vitals:   11/14/23 0749 11/14/23 0908 11/14/23 1150 11/14/23 1605  BP: (!) 86/52 (!) 88/62 (!) 93/53 92/63  Pulse: 83  88 79  Resp:      Temp: 98 F (36.7 C) 98 F (36.7 C) (!) 97.5 F (36.4 C) 98.5 F (36.9 C)  TempSrc: Oral Oral Oral Oral  SpO2: 100%  99% 98%  Weight:      Height:           Data Reviewed:  Basic Metabolic Panel: Recent Labs  Lab 11/08/23 0154 11/08/23 0557 11/10/23 0502 11/11/23 0258 11/12/23 0252 11/13/23 0153 11/14/23 0140  NA 129*   < > 126* 124* 122* 126* 125*  K 3.5   < > 4.3 2.6* 3.3* 2.5* 4.3  CL 97*   < > 93* 91* 87* 88* 89*  CO2 21*   < > 23 22 25 27 27   GLUCOSE 118*   < > 104* 102* 122* 106* 103*  BUN 21*   < > 27* 20 15 43* 22*  CREATININE 0.55   < > 0.69 0.59 0.52 0.68 0.50  CALCIUM  7.9*   < > 8.5* 7.9* 7.9* 8.0* 8.0*  MG 2.0  --  1.7 1.6* 1.8 1.5*  --    < > = values in this interval not displayed.    CBC: Recent Labs  Lab 11/10/23 0502 11/10/23 1806 11/11/23 0258 11/12/23 0252 11/13/23 0153 11/14/23 0140  WBC 7.3  --  8.2 8.5 8.6 7.0  HGB 6.8* 9.0* 8.8* 8.6* 8.6* 9.1*  HCT 19.7* 25.7* 24.4* 23.8* 24.2* 25.7*  MCV 98.5  --  93.5 93.3 94.2 95.2  PLT 118*  --  116* 116* 122* 130*    LFT Recent Labs  Lab 11/10/23 0502 11/11/23 0258 11/12/23 0252 11/13/23 0153 11/14/23  0140  AST 88* 77* 79* 76* 71*  ALT 27 25 20 24 23   ALKPHOS 57 57 61 57 51  BILITOT 2.6* 3.2* 2.7* 2.7* 2.5*  PROT 6.5 6.5 6.4* 6.4* 6.2*  ALBUMIN  2.5* 2.4* 2.5* 2.3* 2.3*     Antibiotics: Anti-infectives (From admission, onward)    Start     Dose/Rate Route Frequency Ordered Stop   11/12/23 1145  rifaximin (XIFAXAN) tablet 550 mg        550 mg Oral 2 times daily 11/12/23 1058     10/29/23 0000  rifaximin (XIFAXAN) 550 MG TABS tablet        550 mg Oral 2 times daily 10/29/23 1129     10/25/23 1415  rifaximin (XIFAXAN) tablet 550 mg  Status:  Discontinued        550 mg Oral 2 times daily 10/25/23 1315 11/10/23 1244   10/12/23 1000  metroNIDAZOLE  (FLAGYL ) tablet 500 mg        500 mg Oral Every 12 hours 10/12/23 0734 10/22/23 2125   10/12/23 0830  cefTRIAXone  (ROCEPHIN ) 2 g in sodium chloride  0.9 % 100 mL IVPB        2 g 200 mL/hr over 30 Minutes Intravenous Every 24 hours 10/12/23 0734 10/22/23 1636   10/07/23 1000  fluconazole   (DIFLUCAN ) tablet 100 mg  Status:  Discontinued        100 mg Oral Daily 10/07/23 0853 10/07/23 0855   10/07/23 1000  fluconazole  (DIFLUCAN ) tablet 150 mg        150 mg Oral Daily 10/07/23 0855 10/07/23 1014   10/04/23 1000  cefTRIAXone  (ROCEPHIN ) 2 g in sodium chloride  0.9 % 100 mL IVPB        2 g 200 mL/hr over 30 Minutes Intravenous Every 24 hours 10/03/23 2154 10/08/23 0854   10/03/23 2015  cefTRIAXone  (ROCEPHIN ) 1 g in sodium chloride  0.9 % 100 mL IVPB        1 g 200 mL/hr over 30 Minutes Intravenous  Once 10/03/23 2010 10/03/23 2212        CONSULTS gastroenterology, general surgery, palliative care  Code Status: Full code  Family Communication: No family present at bedside     Subjective   Denies abdominal pain   Objective    Physical Examination:   General-appears in no acute distress Heart-S1-S2, regular, no murmur auscultated Lungs-clear to auscultation bilaterally, no wheezing or crackles auscultated Abdomen-soft, nontender, no organomegaly Extremities-no edema in the lower extremities Neuro-alert, oriented x3, no focal deficit noted  Status is: Inpatient:      Wound 10/15/23 0900 Pressure Injury Sacrum Mid Stage 2 -  Partial thickness loss of dermis presenting as a shallow open injury with a red, pink wound bed without slough. (Active)        Joan Wood   [facility] If 7PM-7AM, please contact night-coverage at [email or web address], Office  [phone]   11/14/2023, 5:13 PM  LOS: 42 days

## 2023-11-14 NOTE — Plan of Care (Signed)
  Problem: Health Behavior/Discharge Planning: Goal: Ability to manage health-related needs will improve Outcome: Progressing   Problem: Clinical Measurements: Goal: Will remain free from infection Outcome: Progressing   Problem: Clinical Measurements: Goal: Respiratory complications will improve Outcome: Progressing   Problem: Activity: Goal: Risk for activity intolerance will decrease Outcome: Progressing   Problem: Pain Managment: Goal: General experience of comfort will improve and/or be controlled Outcome: Progressing   Problem: Safety: Goal: Ability to remain free from injury will improve Outcome: Progressing   Problem: Safety: Goal: Ability to remain free from injury will improve Outcome: Progressing   Problem: Skin Integrity: Goal: Risk for impaired skin integrity will decrease Outcome: Progressing

## 2023-11-14 NOTE — TOC Progression Note (Signed)
 Transition of Care [facility]) - Progression Note    Patient Details  Name: DORLENE FOOTMAN MRN: [id number] Date of Birth: Jun 03, 1970  Transition of Care [facility] CM/SW Contact  [name], [name] Phone Number: 11/14/2023, 1:04 PM  Clinical Narrative:   CSW contacted [facility], and [facility] to see if any groups had SNF in the [place] area to look at for patient. CSW sent referral for [facility], they are not able to offer a bed for the patient anywhere. [facility] does not have any buildings close to the [place] area. [facility] has buildings in the area, reviewing referral at this time. CSW to follow.    Expected Discharge Plan: Skilled Nursing Facility Barriers to Discharge: Homeless with medical needs, Inadequate or no insurance, Continued Medical Work up, Unsafe home situation               Expected Discharge Plan and Services In-house Referral: Clinical Social Work Discharge Planning Services: CM Consult Post Acute Care Choice: Home Health Living arrangements for the past 2 months: Single Family Home                 DME Arranged: Walker rolling   Date DME Agency Contacted: 10/08/23   Representative spoke with at DME Agency: [name]             Social Drivers of Health (SDOH) Interventions SDOH Screenings   Food Insecurity: No Food Insecurity (10/03/2023)  Housing: Low Risk  (10/03/2023)  Transportation Needs: No Transportation Needs (10/03/2023)  Recent Concern: Transportation Needs - Unmet Transportation Needs (08/20/2023)  Utilities: Not At Risk (10/03/2023)  Depression (PHQ2-9): High Risk (07/30/2023)  Social Connections: Socially Isolated (10/03/2023)  Tobacco Use: High Risk (10/04/2023)    Readmission Risk Interventions    10/04/2023    9:02 AM 09/03/2023   12:31 PM 09/02/2023    9:01 AM  Readmission Risk Prevention Plan  Transportation Screening Complete Complete Complete  PCP or Specialist Appt within 3-5 Days   Complete   HRI or Home Care Consult   Complete  Social Work Consult for Recovery Care Planning/Counseling   Complete  Palliative Care Screening   Not Applicable  Medication Review Oceanographer) Complete Complete Complete  HRI or Home Care Consult Complete Complete   SW Recovery Care/Counseling Consult Complete Complete   Palliative Care Screening Not Applicable Not Applicable   Skilled Nursing Facility Not Applicable Patient Refused

## 2023-11-14 NOTE — Plan of Care (Signed)

## 2023-11-15 DIAGNOSIS — K7031 Alcoholic cirrhosis of liver with ascites: Secondary | ICD-10-CM | POA: Diagnosis not present

## 2023-11-15 DIAGNOSIS — K7290 Hepatic failure, unspecified without coma: Secondary | ICD-10-CM | POA: Diagnosis not present

## 2023-11-15 DIAGNOSIS — D62 Acute posthemorrhagic anemia: Secondary | ICD-10-CM | POA: Diagnosis not present

## 2023-11-15 LAB — PROTIME-INR
INR: 1.9 — ABNORMAL HIGH (ref 0.8–1.2)
Prothrombin Time: 22.6 s — ABNORMAL HIGH (ref 11.4–15.2)

## 2023-11-15 NOTE — Progress Notes (Signed)
 [facility]  PROGRESS NOTE  Joan Wood FMW:[id number] DOB: [birth year] DOA: 10/03/2023 PCP: Edman Meade PEDLAR, FNP   Brief HPI:   53 y.o. female with a history of decompensated liver cirrhosis, PUD, gastric ulcer perforation status post ex lap, GERD, pancreatitis, obstructive sleep apnea. Patient presented secondary to nausea, coffee-ground emesis, bloody diarrhea with concern for upper GI bleed. 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 Hospitalization complicated by hepatic encephalopathy treated with lactulose and rifaximin. Patient also requiring recurrent paracenteses for her recurrent ascites.       Assessment/Plan:   Decompensated alcoholic cirrhosis with ascites Patient treated empirically with Ceftriaxone  for possible SBP, although ascites fluid cell count not consistent with diagnosis. Palliative care consulted for ongoing goals of care discussions. -Continue to treat hepatic encephalopathy -s/p repeat therapeutic paracentesis 11/3 yielding 750cc ascites. Fluid analysis not suggestive of SBP   Hepatic encephalopathy Improved with lactulose and Rifaximin. Patient's insurance will not cover Rifaximin on discharge. -Reported over 6 BM over the weekend, thus decreased lactulose and stopped rifaximin -Pt was much more lethargic after lactulose reduced and rifaximin stopped -Mentation now much better with lactulose to 20g TID and resumption of rifiaximin   Transient alteration of awareness Patient developed significant change in mental status and was difficult to arose. PCCM was consulted for concern that patient would continue to decompensate. Patient spontaneously improved about 30 minutes later. Unclear etiology. She does not appear to have any post-ictal type presentation. She had an elevated temperature; no leukocytosis.  Also complicated by patient's hyponatremia. Blood cultures with no growth to date.    Hypoalbuminemia Secondary to underlying liver disease.   Thrombocytopenia Secondary to underlying liver disease.    AKI Presumed secondary to ATN from contrast. Nephrology was consulted for management. Renal function improved. Patient is not an HD candidate.   Appendicitis General surgery consulted. Patient high-risk for surgical management with recommendation for conservative management. Patient completed antibiotic regimen.   Chronic hypotension Worse this morning. Associated drop in hemoglobin, which may be etiology. Patient has received albumin  IV intermittently. -Continue midodrine  15 mg TID   Chronic hyponatremia Patient's baseline appears to be around 125 -128.  Sodium has significantly worsened. Nephrology consulted on 10/24. Lasix started with initial improvement, however sodium is back down to 118 today. -Continue fluid restriction (1,200 mL) -Nephrology recommendations (10/27): Lasix IV BID -Na improved to 131. Main issue is drinking too much fluids per Nephro -Nephro has since signed off 10/30 -Na down to 122. Main issue is hypervolemia as pt had not been adhering to vol restriction -Na now up to 126 this AM   Acute upper GI bleed Acute esophageal necrosis Acute blood loss anemia Patient seen and evaluated by gastroenterology. Upper GI endoscopy performed on 9/25 with evidence of grade D esophagitis in addition to concern for acute esophageal necrosis. Non-bleeding gastric ulcer noted with sutures. For esophageal necrosis, GI recommended carafate  for two weeks in addition to Protonix  BID and outpatient GI follow-up. Patient with recurrent anemia on 10/24 without obvious hemorrhaging noted and again on 11/1 -Total received 4 units PRBC's this admit   Abdominal pain Presumed secondary to abdominal distension from ascites.  -improved after paracentesis 11/3   Lower extremity edema VTE  ruled out. Likely related to hypoalbuminemia from liver disease.   Hypervolemic hyponatremia.   Hypomagnesemia Low. Replaced   Hypokalemia Remains low, replaced   Tobacco abuse -Continue nicotine   patch   Alcohol abuse Noted.   Severe malnutrition -Dietitian recommendations (10/21): Continue current diet as ordered Encourage PO intake Continue ordering assistance Continue Ensure Plus High Protein po TID, each supplement provides 350 kcal and 20 grams of protein. 1 packet Juven BID, each packet provides 95 calories, 2.5 grams of protein (collagen),  to support wound healing Continue MVI with minerals daily, thiamine  and folic acid  for alcohol use history   Goals of care Goals noted for rehab at SNF. TOC consulted and following   Pressure injury Mid sacrum. Unclear if present on admission.          DVT prophylaxis: SCDs  Medications     sodium chloride    Intravenous Once   calcium  carbonate  1 tablet Oral TID WC   Chlorhexidine  Gluconate Cloth  6 each Topical Daily   feeding supplement  237 mL Oral TID BM   folic acid   1 mg Oral Daily   furosemide  40 mg Intravenous BID   gabapentin   300 mg Oral TID   lactulose  20 g Oral TID   lidocaine   2 patch Transdermal Q24H   lidocaine  (PF)  10 mL Other Once   midodrine   20 mg Oral TID WC   multivitamin with minerals  1 tablet Oral Daily   nutrition supplement (JUVEN)  1 packet Oral BID BM   octreotide   100 mcg Subcutaneous Q8H   pantoprazole   40 mg Oral BID   rifaximin  550 mg Oral BID   thiamine   100 mg Oral Daily     Data Reviewed:   CBG:  No results for input(s): GLUCAP in the last 168 hours.  SpO2: 100 %    Vitals:   11/15/23 0341 11/15/23 0459 11/15/23 0831 11/15/23 1111  BP: (!) 84/52 102/63 105/67 93/63  Pulse: 90  84 74  Resp: 16  20 20   Temp: 98.6 F (37 C)  98.6 F (37 C) 98.2 F (36.8 C)  TempSrc: Oral  Oral Oral  SpO2: 98%  100% 100%  Weight:      Height:          Data  Reviewed:  Basic Metabolic Panel: Recent Labs  Lab 11/10/23 0502 11/11/23 0258 11/12/23 0252 11/13/23 0153 11/14/23 0140  NA 126* 124* 122* 126* 125*  K 4.3 2.6* 3.3* 2.5* 4.3  CL 93* 91* 87* 88* 89*  CO2 23 22 25 27 27   GLUCOSE 104* 102* 122* 106* 103*  BUN 27* 20 15 43* 22*  CREATININE 0.69 0.59 0.52 0.68 0.50  CALCIUM  8.5* 7.9* 7.9* 8.0* 8.0*  MG 1.7 1.6* 1.8 1.5*  --     CBC: Recent Labs  Lab 11/10/23 0502 11/10/23 1806 11/11/23 0258 11/12/23 0252 11/13/23 0153 11/14/23 0140  WBC 7.3  --  8.2 8.5 8.6 7.0  HGB 6.8* 9.0* 8.8* 8.6* 8.6* 9.1*  HCT 19.7* 25.7* 24.4* 23.8* 24.2* 25.7*  MCV 98.5  --  93.5 93.3 94.2 95.2  PLT 118*  --  116* 116* 122* 130*    LFT Recent Labs  Lab 11/10/23 0502 11/11/23 0258 11/12/23 0252 11/13/23 0153 11/14/23 0140  AST 88* 77* 79* 76* 71*  ALT 27 25 20 24 23   ALKPHOS 57 57 61 57 51  BILITOT 2.6* 3.2* 2.7* 2.7* 2.5*  PROT 6.5 6.5 6.4* 6.4* 6.2*  ALBUMIN  2.5* 2.4* 2.5* 2.3* 2.3*     Antibiotics: Anti-infectives (From admission, onward)    Start     Dose/Rate Route Frequency Ordered Stop  11/12/23 1145  rifaximin (XIFAXAN) tablet 550 mg        550 mg Oral 2 times daily 11/12/23 1058     10/29/23 0000  rifaximin (XIFAXAN) 550 MG TABS tablet        550 mg Oral 2 times daily 10/29/23 1129     10/25/23 1415  rifaximin (XIFAXAN) tablet 550 mg  Status:  Discontinued        550 mg Oral 2 times daily 10/25/23 1315 11/10/23 1244   10/12/23 1000  metroNIDAZOLE  (FLAGYL ) tablet 500 mg        500 mg Oral Every 12 hours 10/12/23 0734 10/22/23 2125   10/12/23 0830  cefTRIAXone  (ROCEPHIN ) 2 g in sodium chloride  0.9 % 100 mL IVPB        2 g 200 mL/hr over 30 Minutes Intravenous Every 24 hours 10/12/23 0734 10/22/23 1636   10/07/23 1000  fluconazole  (DIFLUCAN ) tablet 100 mg  Status:  Discontinued        100 mg Oral Daily 10/07/23 0853 10/07/23 0855   10/07/23 1000  fluconazole  (DIFLUCAN ) tablet 150 mg        150 mg Oral Daily 10/07/23  0855 10/07/23 1014   10/04/23 1000  cefTRIAXone  (ROCEPHIN ) 2 g in sodium chloride  0.9 % 100 mL IVPB        2 g 200 mL/hr over 30 Minutes Intravenous Every 24 hours 10/03/23 2154 10/08/23 0854   10/03/23 2015  cefTRIAXone  (ROCEPHIN ) 1 g in sodium chloride  0.9 % 100 mL IVPB        1 g 200 mL/hr over 30 Minutes Intravenous  Once 10/03/23 2010 10/03/23 2212        CONSULTS gastroenterology, general surgery, palliative care  Code Status: Full code  Family Communication: No family present at bedside     Subjective   Foley catheter was accidentally pulled last night.  Patient has been voiding well.   Objective    Physical Examination:   General-appears in no acute distress Heart-S1-S2, regular, no murmur auscultated Lungs-clear to auscultation bilaterally, no wheezing or crackles auscultated Abdomen-soft, nontender, no organomegaly Extremities-no edema in the lower extremities Neuro-alert, oriented x3, no focal deficit noted  Status is: Inpatient:      Wound 10/15/23 0900 Pressure Injury Sacrum Mid Stage 2 -  Partial thickness loss of dermis presenting as a shallow open injury with a red, pink wound bed without slough. (Active)        Sabas GORMAN Brod   [facility] If 7PM-7AM, please contact night-coverage at [email or web address], Office  [phone]   11/15/2023, 2:41 PM  LOS: 43 days

## 2023-11-15 NOTE — Progress Notes (Signed)
 Physical Therapy Treatment Patient Details Name: Joan Wood MRN: [id number] DOB: October 24, 1970 Today's Date: 11/15/2023   History of Present Illness 53 y.o. female presents to [facility] hospital on 10/03/2023 with nausea/vomiting and bloody diarrhea. Admitted with decompensated alcoholic cirrhosis w/ ascites and hepatic encephalopathy. 9/25 Upper EGD concerning for possible acute esophageal necrosis, portal hypertension, non-bleeding gastric and duodenal ulcers. Pt also underwent paracentesis on 9/25, 10/6, 10/22. PMH includes decompensated cirrhosis, PUD, GERD, pancreatitis, OSA.   PT Comments  Pt received in supine and agreeable to PT session. Pt improved by being able to increase gait distance to 33ft with RW before needing a seated rest break. MinA/CGA for all mobility this session with cueing for safety throughout. Pt tends to be impulsive with moving quickly and attempting to stand prior to having staff close by for assistance. Multiple repetitions of sit<>stand transfers as pt frequently returned to [facility]. TotalA needed for pericare in standing. BP stable throughout session. Continue to recommend <3hrs post acute rehab with acute PT to follow.    If plan is discharge home, recommend the following: A lot of help with walking and/or transfers;A lot of help with bathing/dressing/bathroom;Assistance with cooking/housework;Assist for transportation;Help with stairs or ramp for entrance;Direct supervision/assist for medications management;Direct supervision/assist for financial management;Supervision due to cognitive status   Can travel by private vehicle     Yes  Equipment Recommendations  Rolling walker (2 wheels);BSC/3in1       Precautions / Restrictions Precautions Precautions: Fall Recall of Precautions/Restrictions: Impaired Precaution/Restrictions Comments: orthostatic Restrictions Weight Bearing Restrictions Per Provider Order: No     Mobility  Bed Mobility Overal bed mobility: Needs  Assistance Bed Mobility: Supine to Sit, Sit to Supine    Supine to sit: Contact guard Sit to supine: Min assist   General bed mobility comments: MinA for return to supine for slight LE management    Transfers Overall transfer level: Needs assistance Equipment used: Rolling walker (2 wheels) Transfers: Sit to/from Stand, Bed to chair/wheelchair/BSC Sit to Stand: Contact guard assist, Min assist   Step pivot transfers: Min assist     General transfer comment: fluctuated between MinA for slight boost-up and CGA. MinA for step-pivots to assist with RW management    Ambulation/Gait Ambulation/Gait assistance: Contact guard assist, Min assist Gait Distance (Feet): 70 Feet (x2) Assistive device: Rolling walker (2 wheels) Gait Pattern/deviations: Step-through pattern, Decreased stride length, Drifts right/left Gait velocity: decr     General Gait Details: step through gait pattern with pt occasionally drifing right/left. Easily distracted by loud noises and other people in the hallway. x1 seated rest break due to fatigue    Balance Overall balance assessment: Needs assistance Sitting-balance support: No upper extremity supported, Feet supported Sitting balance-Leahy Scale: Good     Standing balance support: Bilateral upper extremity supported, Reliant on assistive device for balance Standing balance-Leahy Scale: Poor Standing balance comment: reliant on UE support     Communication Communication Communication: No apparent difficulties  Cognition Arousal: Alert Behavior During Therapy: Impulsive   PT - Cognitive impairments: Awareness, Problem solving, Safety/Judgement    PT - Cognition Comments: Reported having a fear of falling, however, would impulsively stand without therapist being close by. Often tangential and requiring redirection Following commands: Impaired Following commands impaired: Follows multi-step commands inconsistently    Cueing Cueing Techniques: Verbal  cues, Visual cues         Pertinent Vitals/Pain Pain Assessment Pain Assessment: Faces Faces Pain Scale: Hurts little more Pain Location: L foot when pt would  kick RW leg Pain Descriptors / Indicators: Grimacing, Discomfort Pain Intervention(s): Limited activity within patient's tolerance, Monitored during session, Repositioned     PT Goals (current goals can now be found in the care plan section) Acute Rehab PT Goals PT Goal Formulation: With patient Time For Goal Achievement: 11/29/23 Potential to Achieve Goals: Fair Progress towards PT goals: Progressing toward goals    Frequency    Min 2X/week       AM-PAC PT 6 Clicks Mobility   Outcome Measure  Help needed turning from your back to your side while in a flat bed without using bedrails?: A Little Help needed moving from lying on your back to sitting on the side of a flat bed without using bedrails?: A Little Help needed moving to and from a bed to a chair (including a wheelchair)?: A Little Help needed standing up from a chair using your arms (e.g., wheelchair or bedside chair)?: A Little Help needed to walk in hospital room?: A Little Help needed climbing 3-5 steps with a railing? : Total 6 Click Score: 16    End of Session Equipment Utilized During Treatment: Gait belt Activity Tolerance: Patient tolerated treatment well Patient left: in bed;with call bell/phone within reach;with bed alarm set Nurse Communication: Mobility status PT Visit Diagnosis: Other abnormalities of gait and mobility (R26.89);Muscle weakness (generalized) (M62.81);Unsteadiness on feet (R26.81);Difficulty in walking, not elsewhere classified (R26.2)     Time: 9090-9054 PT Time Calculation (min) (ACUTE ONLY): 36 min  Charges:    $Gait Training: 8-22 mins $Therapeutic Activity: 8-22 mins PT General Charges $$ ACUTE PT VISIT: 1 Visit                    Kate ORN, PT, DPT Secure Chat Preferred  Rehab Office [phone]   Shiela Bruns  W[place]olyn 11/15/2023, 10:28 AM

## 2023-11-15 NOTE — Plan of Care (Signed)

## 2023-11-16 ENCOUNTER — Encounter (HOSPITAL_COMMUNITY): Payer: Self-pay | Admitting: Internal Medicine

## 2023-11-16 DIAGNOSIS — K922 Gastrointestinal hemorrhage, unspecified: Secondary | ICD-10-CM | POA: Diagnosis not present

## 2023-11-16 DIAGNOSIS — E876 Hypokalemia: Secondary | ICD-10-CM | POA: Diagnosis not present

## 2023-11-16 DIAGNOSIS — K7460 Unspecified cirrhosis of liver: Secondary | ICD-10-CM | POA: Diagnosis not present

## 2023-11-16 DIAGNOSIS — D62 Acute posthemorrhagic anemia: Secondary | ICD-10-CM | POA: Diagnosis not present

## 2023-11-16 DIAGNOSIS — E871 Hypo-osmolality and hyponatremia: Secondary | ICD-10-CM | POA: Diagnosis not present

## 2023-11-16 DIAGNOSIS — R197 Diarrhea, unspecified: Secondary | ICD-10-CM | POA: Diagnosis not present

## 2023-11-16 DIAGNOSIS — Z515 Encounter for palliative care: Secondary | ICD-10-CM | POA: Diagnosis not present

## 2023-11-16 DIAGNOSIS — K7290 Hepatic failure, unspecified without coma: Secondary | ICD-10-CM | POA: Diagnosis not present

## 2023-11-16 LAB — PROTIME-INR
INR: 1.8 — ABNORMAL HIGH (ref 0.8–1.2)
Prothrombin Time: 21.6 s — ABNORMAL HIGH (ref 11.4–15.2)

## 2023-11-16 LAB — CULTURE, BODY FLUID W GRAM STAIN -BOTTLE: Culture: NO GROWTH

## 2023-11-16 MED ORDER — SODIUM CHLORIDE 0.9 % IV BOLUS
250.0000 mL | Freq: Once | INTRAVENOUS | Status: AC
  Administered 2023-11-16: 250 mL via INTRAVENOUS

## 2023-11-16 NOTE — Progress Notes (Signed)
 Occupational Therapy Treatment Patient Details Name: Joan Wood MRN: [id number] DOB: [birth year] Today's Date: 11/16/2023   History of present illness 53 y.o. female presents to [facility] hospital on 10/03/2023 with nausea/vomiting and bloody diarrhea. Admitted with decompensated alcoholic cirrhosis w/ ascites and hepatic encephalopathy. 9/25 Upper EGD concerning for possible acute esophageal necrosis, portal hypertension, non-bleeding gastric and duodenal ulcers. Pt also underwent paracentesis on 9/25, 10/6, 10/22. PMH includes decompensated cirrhosis, PUD, GERD, pancreatitis, OSA.   OT comments  Patient seated on EOB asking to use BSC. Upon standing patient has already had BM but stated she still needed to use BSC to void.  Patient was min to CGA to stand from EOB and ambulate to [facility].  Patient required max assist for toilet hygiene while standing. Patient performed bathing and dressing seated at sink before returning to bed.  Patient will benefit from continued inpatient follow up therapy, <3 hours/day.  Acute OT to continue to follow to address established goals to facilitate DC to next venue of care.         If plan is discharge home, recommend the following:  A little help with walking and/or transfers;A lot of help with bathing/dressing/bathroom;Assistance with cooking/housework;Direct supervision/assist for medications management;Assist for transportation;Supervision due to cognitive status   Equipment Recommendations  BSC/3in1    Recommendations for Other Services      Precautions / Restrictions Precautions Precautions: Fall Recall of Precautions/Restrictions: Impaired Precaution/Restrictions Comments: orthostatic       Mobility Bed Mobility Overal bed mobility: Needs Assistance Bed Mobility: Sit to Supine       Sit to supine: Min assist   General bed mobility comments: seated on EOB upon entry and min assist to return to supine    Transfers Overall transfer level: Needs  assistance Equipment used: Rolling walker (2 wheels) Transfers: Sit to/from Stand, Bed to chair/wheelchair/BSC Sit to Stand: Contact guard assist, Min assist           General transfer comment: CGA to min assist for sit to stand and for transfers with verbal cues for hand placement     Balance Overall balance assessment: Needs assistance Sitting-balance support: No upper extremity supported, Feet supported Sitting balance-Leahy Scale: Good Sitting balance - Comments: seated on EOB upon entry   Standing balance support: Bilateral upper extremity supported, Reliant on assistive device for balance Standing balance-Leahy Scale: Poor Standing balance comment: reliant on UE support                           ADL either performed or assessed with clinical judgement   ADL Overall ADL's : Needs assistance/impaired     Grooming: Wash/dry hands;Wash/dry face;Set up;Sitting   Upper Body Bathing: Supervision/ safety;Cueing for sequencing;Sitting Upper Body Bathing Details (indicate cue type and reason): at sink Lower Body Bathing: Maximal assistance;Moderate assistance;Sit to/from stand Lower Body Bathing Details (indicate cue type and reason): able to bathe upper legs in sitting, max assist for peri area while standing Upper Body Dressing : Set up;Sitting Upper Body Dressing Details (indicate cue type and reason): gown change Lower Body Dressing: Minimal assistance;Sitting/lateral leans Lower Body Dressing Details (indicate cue type and reason): change socks Toilet Transfer: Contact guard assist;Minimal assistance;Ambulation;BSC/3in1;Rolling walker (2 wheels) Toilet Transfer Details (indicate cue type and reason): cues for hand placement Toileting- Clothing Manipulation and Hygiene: Maximal assistance;Sit to/from stand Toileting - Clothing Manipulation Details (indicate cue type and reason): required increased assistance due to patient was sitting in BM before  transfer to [facility]      Functional mobility during ADLs: Contact guard assist;Minimal assistance;Rolling walker (2 wheels) General ADL Comments: verbal cues for encouragement to assist with bathing    Extremity/Trunk Assessment              Vision       Perception     Praxis     Communication Communication Communication: No apparent difficulties   Cognition Arousal: Alert Behavior During Therapy: Impulsive Cognition: Cognition impaired   Orientation impairments: Situation, Time Awareness: Intellectual awareness impaired Memory impairment (select all impairments): Short-term memory, Working memory Attention impairment (select first level of impairment): Sustained attention Executive functioning impairment (select all impairments): Organization, Reasoning, Problem solving, Initiation OT - Cognition Comments: believed it was evening/night, forgot she completed breakfast                 Following commands: Impaired Following commands impaired: Follows multi-step commands inconsistently      Cueing   Cueing Techniques: Verbal cues, Visual cues  Exercises      Shoulder Instructions       General Comments      Pertinent Vitals/ Pain       Pain Assessment Pain Assessment: Faces Faces Pain Scale: Hurts little more Pain Location: bottom during self care Pain Descriptors / Indicators: Grimacing, Discomfort Pain Intervention(s): Monitored during session, Repositioned  Home Living                                          Prior Functioning/Environment              Frequency  Min 2X/week        Progress Toward Goals  OT Goals(current goals can now be found in the care plan section)  Progress towards OT goals: Progressing toward goals  Acute Rehab OT Goals Patient Stated Goal: to go to rehab closer to her children OT Goal Formulation: With patient Time For Goal Achievement: 11/20/23 Potential to Achieve Goals: Fair ADL Goals Pt Will Perform  Grooming: with contact guard assist;standing Pt Will Perform Lower Body Dressing: with supervision;sitting/lateral leans;sit to/from stand;with adaptive equipment Pt Will Transfer to Toilet: with min assist;bedside commode Pt Will Perform Toileting - Clothing Manipulation and hygiene: with supervision;sit to/from stand;sitting/lateral leans Pt/caregiver will Perform Home Exercise Program: Both right and left upper extremity;With written HEP provided;With theraband  Plan      Co-evaluation                 AM-PAC OT 6 Clicks Daily Activity     Outcome Measure   Help from another person eating meals?: None Help from another person taking care of personal grooming?: A Little Help from another person toileting, which includes using toliet, bedpan, or urinal?: A Lot Help from another person bathing (including washing, rinsing, drying)?: A Lot Help from another person to put on and taking off regular upper body clothing?: A Lot Help from another person to put on and taking off regular lower body clothing?: A Little 6 Click Score: 16    End of Session Equipment Utilized During Treatment: Gait belt;Rolling walker (2 wheels)  OT Visit Diagnosis: Other abnormalities of gait and mobility (R26.89);Muscle weakness (generalized) (M62.81);Pain;Other symptoms and signs involving cognitive function   Activity Tolerance Patient tolerated treatment well   Patient Left in bed;with call bell/phone within reach;with bed alarm set   Nurse Communication Mobility  status        Time: 9094-9058 OT Time Calculation (min): 36 min  Charges: OT General Charges $OT Visit: 1 Visit OT Treatments $Self Care/Home Management : 23-37 mins  Dick Wood, OTA Acute Rehabilitation Services  Office [phone]   Joan Wood 11/16/2023, 1:31 PM

## 2023-11-16 NOTE — Plan of Care (Signed)

## 2023-11-16 NOTE — Progress Notes (Signed)
 [facility]  PROGRESS NOTE  Joan Wood FMW:[id number] DOB: 02-26-1970 DOA: 10/03/2023 PCP: Edman Meade PEDLAR, FNP   Brief HPI:   53 y.o. female with a history of decompensated liver cirrhosis, PUD, gastric ulcer perforation status post ex lap, GERD, pancreatitis, obstructive sleep apnea. Patient presented secondary to nausea, coffee-ground emesis, bloody diarrhea with concern for upper GI bleed. 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 Hospitalization complicated by hepatic encephalopathy treated with lactulose and rifaximin. Patient also requiring recurrent paracenteses for her recurrent ascites.       Assessment/Plan:   Decompensated alcoholic cirrhosis with ascites Patient treated empirically with Ceftriaxone  for possible SBP, although ascites fluid cell count not consistent with diagnosis. Palliative care consulted for ongoing goals of care discussions. -Continue to treat hepatic encephalopathy -s/p repeat therapeutic paracentesis 11/3 yielding 750cc ascites. Fluid analysis not suggestive of SBP   Hepatic encephalopathy Improved with lactulose and Rifaximin. Patient's insurance will not cover Rifaximin on discharge. -Reported over 6 BM over the weekend, thus decreased lactulose and stopped rifaximin -Pt was much more lethargic after lactulose reduced and rifaximin stopped -Mentation now much better with lactulose to 20g TID and resumption of rifiaximin   Transient alteration of awareness Patient developed significant change in mental status and was difficult to arose. PCCM was consulted for concern that patient would continue to decompensate. Patient spontaneously improved about 30 minutes later. Unclear etiology. She does not appear to have any post-ictal type presentation. She had an elevated temperature; no leukocytosis.  Also complicated by patient's hyponatremia. Blood cultures with no growth to date.    Hypoalbuminemia Secondary to underlying liver disease.   Thrombocytopenia Secondary to underlying liver disease.    AKI Presumed secondary to ATN from contrast. Nephrology was consulted for management. Renal function improved. Patient is not an HD candidate.   Appendicitis General surgery consulted. Patient high-risk for surgical management with recommendation for conservative management. Patient completed antibiotic regimen.   Chronic hypotension Worse this morning. Associated drop in hemoglobin, which may be etiology. Patient has received albumin  IV intermittently. -Continue midodrine  15 mg TID   Chronic hyponatremia Patient's baseline appears to be around 125 -128.  Sodium has significantly worsened. Nephrology consulted on 10/24. Lasix started with initial improvement, however sodium is back down to 118 today. -Continue fluid restriction (1,200 mL) -Nephrology recommendations (10/27): Lasix IV BID -Na improved to 131. Main issue is drinking too much fluids per Nephro -Nephro has since signed off 10/30 -Na down to 122. Main issue is hypervolemia as pt had not been adhering to vol restriction - Sodium has improved to 125   Acute upper GI bleed Acute esophageal necrosis Acute blood loss anemia Patient seen and evaluated by gastroenterology. Upper GI endoscopy performed on 9/25 with evidence of grade D esophagitis in addition to concern for acute esophageal necrosis. Non-bleeding gastric ulcer noted with sutures. For esophageal necrosis, GI recommended carafate  for two weeks in addition to Protonix  BID and outpatient GI follow-up. Patient with recurrent anemia on 10/24 without obvious hemorrhaging noted and again on 11/1 -Total received 4 units PRBC's this admit   Abdominal pain Presumed secondary to abdominal distension from ascites.  -improved after paracentesis 11/3   Lower extremity  edema VTE ruled out. Likely related to hypoalbuminemia from liver disease.   Hypervolemic hyponatremia.   Hypomagnesemia Low. Replaced   Hypokalemia Remains low, replaced   Tobacco abuse -Continue nicotine  patch  Alcohol abuse Noted.   Severe malnutrition -Dietitian recommendations (10/21): Continue current diet as ordered Encourage PO intake Continue ordering assistance Continue Ensure Plus High Protein po TID, each supplement provides 350 kcal and 20 grams of protein. 1 packet Juven BID, each packet provides 95 calories, 2.5 grams of protein (collagen),  to support wound healing Continue MVI with minerals daily, thiamine  and folic acid  for alcohol use history   Goals of care Goals noted for rehab at SNF. TOC consulted and following   Pressure injury Mid sacrum. Unclear if present on admission.          DVT prophylaxis: SCDs  Medications     sodium chloride    Intravenous Once   calcium  carbonate  1 tablet Oral TID WC   feeding supplement  237 mL Oral TID BM   folic acid   1 mg Oral Daily   furosemide  40 mg Intravenous BID   gabapentin   300 mg Oral TID   lactulose  20 g Oral TID   lidocaine   2 patch Transdermal Q24H   lidocaine  (PF)  10 mL Other Once   midodrine   20 mg Oral TID WC   multivitamin with minerals  1 tablet Oral Daily   nutrition supplement (JUVEN)  1 packet Oral BID BM   octreotide   100 mcg Subcutaneous Q8H   pantoprazole   40 mg Oral BID   rifaximin  550 mg Oral BID   thiamine   100 mg Oral Daily     Data Reviewed:   CBG:  No results for input(s): GLUCAP in the last 168 hours.  SpO2: 98 %    Vitals:   11/16/23 0416 11/16/23 0500 11/16/23 0516 11/16/23 0818  BP: (!) 85/54  (!) 82/56 93/66  Pulse: 96  90 86  Resp: 15   18  Temp: 99.1 F (37.3 C)   99.4 F (37.4 C)  TempSrc: Oral   Oral  SpO2: 98%     Weight:  58.6 kg    Height:          Data Reviewed:  Basic Metabolic Panel: Recent Labs  Lab 11/10/23 0502  11/11/23 0258 11/12/23 0252 11/13/23 0153 11/14/23 0140  NA 126* 124* 122* 126* 125*  K 4.3 2.6* 3.3* 2.5* 4.3  CL 93* 91* 87* 88* 89*  CO2 23 22 25 27 27   GLUCOSE 104* 102* 122* 106* 103*  BUN 27* 20 15 43* 22*  CREATININE 0.69 0.59 0.52 0.68 0.50  CALCIUM  8.5* 7.9* 7.9* 8.0* 8.0*  MG 1.7 1.6* 1.8 1.5*  --     CBC: Recent Labs  Lab 11/10/23 0502 11/10/23 1806 11/11/23 0258 11/12/23 0252 11/13/23 0153 11/14/23 0140  WBC 7.3  --  8.2 8.5 8.6 7.0  HGB 6.8* 9.0* 8.8* 8.6* 8.6* 9.1*  HCT 19.7* 25.7* 24.4* 23.8* 24.2* 25.7*  MCV 98.5  --  93.5 93.3 94.2 95.2  PLT 118*  --  116* 116* 122* 130*    LFT Recent Labs  Lab 11/10/23 0502 11/11/23 0258 11/12/23 0252 11/13/23 0153 11/14/23 0140  AST 88* 77* 79* 76* 71*  ALT 27 25 20 24 23   ALKPHOS 57 57 61 57 51  BILITOT 2.6* 3.2* 2.7* 2.7* 2.5*  PROT 6.5 6.5 6.4* 6.4* 6.2*  ALBUMIN  2.5* 2.4* 2.5* 2.3* 2.3*     Antibiotics: Anti-infectives (From admission, onward)    Start     Dose/Rate Route Frequency Ordered Stop   11/12/23 1145  rifaximin (XIFAXAN) tablet 550 mg  550 mg Oral 2 times daily 11/12/23 1058     10/29/23 0000  rifaximin (XIFAXAN) 550 MG TABS tablet        550 mg Oral 2 times daily 10/29/23 1129     10/25/23 1415  rifaximin (XIFAXAN) tablet 550 mg  Status:  Discontinued        550 mg Oral 2 times daily 10/25/23 1315 11/10/23 1244   10/12/23 1000  metroNIDAZOLE  (FLAGYL ) tablet 500 mg        500 mg Oral Every 12 hours 10/12/23 0734 10/22/23 2125   10/12/23 0830  cefTRIAXone  (ROCEPHIN ) 2 g in sodium chloride  0.9 % 100 mL IVPB        2 g 200 mL/hr over 30 Minutes Intravenous Every 24 hours 10/12/23 0734 10/22/23 1636   10/07/23 1000  fluconazole  (DIFLUCAN ) tablet 100 mg  Status:  Discontinued        100 mg Oral Daily 10/07/23 0853 10/07/23 0855   10/07/23 1000  fluconazole  (DIFLUCAN ) tablet 150 mg        150 mg Oral Daily 10/07/23 0855 10/07/23 1014   10/04/23 1000  cefTRIAXone  (ROCEPHIN ) 2 g in  sodium chloride  0.9 % 100 mL IVPB        2 g 200 mL/hr over 30 Minutes Intravenous Every 24 hours 10/03/23 2154 10/08/23 0854   10/03/23 2015  cefTRIAXone  (ROCEPHIN ) 1 g in sodium chloride  0.9 % 100 mL IVPB        1 g 200 mL/hr over 30 Minutes Intravenous  Once 10/03/23 2010 10/03/23 2212        CONSULTS gastroenterology, general surgery, palliative care  Code Status: Full code  Family Communication: No family present at bedside     Subjective   Having loose stools today.  No other complaints   Objective    Physical Examination:  Appears in no acute distress S1-S2, regular, no murmur auscultated Abdomen is soft, nontender, no organomegaly Extremities no edema  Status is: Inpatient:      Wound 10/15/23 0900 Pressure Injury Sacrum Mid Stage 2 -  Partial thickness loss of dermis presenting as a shallow open injury with a red, pink wound bed without slough. (Active)        Joan Wood   [facility] If 7PM-7AM, please contact night-coverage at [email or web address], Office  [phone]   11/16/2023, 11:54 AM  LOS: 44 days

## 2023-11-16 NOTE — Progress Notes (Signed)
 Palliative Medicine Inpatient Follow Up Note   HPI: 53 y.o. female  with past medical history of  s/p exploratory laparotomy for perforated gastric ulcer repair with omental patch on 7/27, ongoing alcohol use disorder, tobacco abuse, cirrhosis, HTN, GERD, anxiety/depression, multiple hospitalizations,  admitted on 10/03/2023 at [facility] with complaints of nausea, vomiting and coffee-ground emesis, symptoms ongoing for last 2 days, she reports last alcoholic beverage she had Monday or Sunday, for nausea, vomiting and diarrhea has been ongoing for last 2 days, and it turned today into coffee-ground emesis. Later (10/04/2023) transferred to [facility] ICU for further management. Now status post upper endoscopy, showing black discoloration of esophageal mucosa concerning for acute esophageal necrosis, portal hypertension, non-bleeding gastric and duodenal ulcers.    Worth to note that this is her 7 inpatient admissions, and has 4 ED encounter. Her most recent hospital stay was in August 2025 due to GI bleed and abdominal pain.    PMT has been consulted to assist with goals of care conversation. Patient/Family face treatment option decisions, advanced directive decisions and anticipatory care needs.    Family face treatment option decision, advance directive decisions and anticipatory care needs.    Today's Discussion 11/16/2023  *Please note that this is a verbal dictation therefore any spelling or grammatical errors are due to the Dragon [employer] One system interpretation.  Chart reviewed inclusive of vital signs, progress notes, laboratory results, and diagnostic images.  Ammonia level increasing from 62 last 10/15/23 to 78 10/20/2023 then 88 on 10/24/2023. Latest ammonia level as of 11/13/2023 is 82. Most recent paracentesis done on 11/12/2023, yielding 750cc of ascitic fluid. Blood pressure remains soft. On 1,200 ml fluid restriction.    Patient was seen at bedside today, sitting at the edge of the bed eating  breakfast. She appeared in good spirits and reported feeling significantly better compared to previous weeks. She was alert, oriented, and able to communicate her needs. No family members were present during the visit.  Patient stated she has been actively participating in physical therapy and shared that the plan is for her to transition to a skilled nursing facility near her children in the [place] area for rehabilitation. She denied any current pain or abdominal discomfort. Notably, she underwent paracentesis during the first week of November, which she described as providing substantial relief.  Goals of care were reviewed and remain unchanged.  She wishes to continue treating the treatable despite understanding her serious liver condition and the potential for major setbacks. Advanced directives were discussed. No documentation of HCPOA or living will is currently on file. Patient reiterated that, should she lose decision-making capacity, her children Duwaine and [name] will serve as her healthcare decision-makers. Assistance with completing these documents was offered.  We also discussed life events and her children. She expressed gratitude for life but became emotional when unable to recall one child's name. Emotional support and reassurance were provided, noting this is not uncommon given her disease process. All questions and concerns were addressed.  Following the visit, an attempt was made to contact Megan by phone to provide an update and offer support. Unable to leave a message, a return call was requested.  Patient face treatment option decisions, advanced directive decisions and anticipatory care needs.   Questions and concerns addressed   Palliative Support Provided.   Objective Assessment:  Physical Exam Constitutional:      Appearance: chronically-ill, fatigued-looking HENT:     Head: Normocephalic and atraumatic. :  Cardiovascular:  Normal rate Pulmonary:     Effort:  Pulmonary effort is normal. No respiratory distress.     Breath sounds: Normal breath sounds. No wheezing.  Abdominal:     General: Bowel sounds are normal.     Palpations: Abdomen is soft.     Tenderness: positive for abdominal ternderness Musculoskeletal:        General: Normal range of motion.  Skin:    General: Pale,  skin is warm and dry.  Neurological:     General: No focal deficit present.     Mental Status: She is alert.    Vital Signs Vitals:   11/16/23 0516 11/16/23 0818  BP: (!) 82/56 93/66  Pulse: 90 86  Resp:  18  Temp:  99.4 F (37.4 C)  SpO2:      Intake/Output Summary (Last 24 hours) at 11/16/2023 0955 Last data filed at 11/16/2023 0645 Gross per 24 hour  Intake 1200 ml  Output 550 ml  Net 650 ml   Last Weight  Most recent update: 11/16/2023  6:46 AM    Weight  58.6 kg (129 lb 1.6 oz)                SUMMARY OF RECOMMENDATIONS   Code Status: Maintain Full Code Continue current scope of care Chaplain currently providing support and assistance with AD creation. Continue to provide psycho-social and emotional support to patient and family Palliative medicine team will continue to follow.  Discharge Planning: Per TOC, when medically stable, discharge to SNF in [place] area to be near patient's children.   Symptom Management: Per Primary team Palliative medicine is available to assist as needed.      Time Spent: 35 minutes  Detailed review of medical records (labs, imaging, vital signs), medically appropriate exam, discussed with treatment team, counseling and education to patient, family, & staff, documenting clinical information, coordination of care.   ______________________________________________________________________________________ Kathlyne Bolder NP-C [facility] Palliative Medicine Team Team Cell Phone: [phone] Please utilize secure chat with additional questions, if there is no response within 30 minutes please call the above  phone number  Palliative Medicine Team providers are available by phone from 7am to 7pm daily and can be reached through the team cell phone.  Should this patient require assistance outside of these hours, please call the patient's attending physician.

## 2023-11-17 DIAGNOSIS — D62 Acute posthemorrhagic anemia: Secondary | ICD-10-CM | POA: Diagnosis not present

## 2023-11-17 DIAGNOSIS — E871 Hypo-osmolality and hyponatremia: Secondary | ICD-10-CM | POA: Diagnosis not present

## 2023-11-17 DIAGNOSIS — E876 Hypokalemia: Secondary | ICD-10-CM | POA: Diagnosis not present

## 2023-11-17 DIAGNOSIS — K7290 Hepatic failure, unspecified without coma: Secondary | ICD-10-CM | POA: Diagnosis not present

## 2023-11-17 DIAGNOSIS — Z515 Encounter for palliative care: Secondary | ICD-10-CM | POA: Diagnosis not present

## 2023-11-17 DIAGNOSIS — Z7189 Other specified counseling: Secondary | ICD-10-CM | POA: Diagnosis not present

## 2023-11-17 DIAGNOSIS — K7460 Unspecified cirrhosis of liver: Secondary | ICD-10-CM | POA: Diagnosis not present

## 2023-11-17 DIAGNOSIS — R197 Diarrhea, unspecified: Secondary | ICD-10-CM | POA: Diagnosis not present

## 2023-11-17 DIAGNOSIS — R112 Nausea with vomiting, unspecified: Secondary | ICD-10-CM | POA: Diagnosis not present

## 2023-11-17 DIAGNOSIS — K922 Gastrointestinal hemorrhage, unspecified: Secondary | ICD-10-CM | POA: Diagnosis not present

## 2023-11-17 LAB — PROTIME-INR
INR: 1.6 — ABNORMAL HIGH (ref 0.8–1.2)
Prothrombin Time: 19.6 s — ABNORMAL HIGH (ref 11.4–15.2)

## 2023-11-17 NOTE — Progress Notes (Signed)
 Palliative Medicine Inpatient Follow Up Note   HPI: 53 y.o. female  with past medical history of  s/p exploratory laparotomy for perforated gastric ulcer repair with omental patch on 7/27, ongoing alcohol use disorder, tobacco abuse, cirrhosis, HTN, GERD, anxiety/depression, multiple hospitalizations,  admitted on 10/03/2023 at [facility] with complaints of nausea, vomiting and coffee-ground emesis, symptoms ongoing for last 2 days, she reports last alcoholic beverage she had Monday or Sunday, for nausea, vomiting and diarrhea has been ongoing for last 2 days, and it turned today into coffee-ground emesis. Later (10/04/2023) transferred to [facility] ICU for further management. Now status post upper endoscopy, showing black discoloration of esophageal mucosa concerning for acute esophageal necrosis, portal hypertension, non-bleeding gastric and duodenal ulcers.    Worth to note that this is her 7 inpatient admissions, and has 4 ED encounter. Her most recent hospital stay was in August 2025 due to GI bleed and abdominal pain.    PMT has been consulted to assist with goals of care conversation. Patient/Family face treatment option decisions, advanced directive decisions and anticipatory care needs.    Family face treatment option decision, advance directive decisions and anticipatory care needs.    Today's Discussion 11/17/2023  *Please note that this is a verbal dictation therefore any spelling or grammatical errors are due to the Dragon Medical One system interpretation.  Chart reviewed inclusive of vital signs, progress notes, laboratory results, and diagnostic images.  Ammonia level increasing from 62 last 10/15/23 to 78 10/20/2023 then 88 on 10/24/2023. Latest ammonia level as of 11/13/2023 is 82. Most recent paracentesis done on 11/12/2023, yielding 750cc of ascitic fluid. Blood pressure remains soft. On 1,200 ml fluid restriction. Sodium level improved to 125 on 11/14/2023   Patient was seen at bedside  today, sitting at the edge of the bed. More alert. Able to make needs known. She appeared in good spirits and reported feeling significantly better compared to previous weeks.No family members were present during the visit.  Goals of care were reviewed and remain unchanged.  She wishes to continue treating the treatable despite understanding her serious liver condition and the potential for major setbacks.  She is anticipating to be discharged to SNF near her children in the [place] area for rehabilitation.   Patient face treatment option decisions, advanced directive decisions and anticipatory care needs.   Questions and concerns addressed   Palliative Support Provided.   Objective Assessment:  Physical Exam Constitutional:      Appearance: chronically-ill, fatigued-looking HENT:     Head: Normocephalic and atraumatic. :  Cardiovascular:     Normal rate Pulmonary:     Effort: Pulmonary effort is normal. No respiratory distress.     Breath sounds: Normal breath sounds. No wheezing.  Abdominal:     General: Bowel sounds are normal.     Palpations: Abdomen is soft.     Tenderness: positive for abdominal ternderness Musculoskeletal:        General: Normal range of motion.  Skin:    General: Pale,  skin is warm and dry.  Neurological:     General: No focal deficit present.     Mental Status: She is alert.    Vital Signs Vitals:   11/17/23 0800 11/17/23 1101  BP: 94/64 100/64  Pulse: 82 92  Resp: 16 18  Temp: 98.5 F (36.9 C) 98.6 F (37 C)  SpO2: 100% 98%    Intake/Output Summary (Last 24 hours) at 11/17/2023 1314 Last data filed at 11/16/2023 1953 Gross per  24 hour  Intake 480 ml  Output 0 ml  Net 480 ml   Last Weight  Most recent update: 11/16/2023  6:46 AM    Weight  58.6 kg (129 lb 1.6 oz)                SUMMARY OF RECOMMENDATIONS   Code Status: Maintain Full Code Continue current scope of care Chaplain currently providing support and assistance with  AD creation. Continue to provide psycho-social and emotional support to patient and family Palliative medicine team will continue to follow.  Discharge Planning: Per TOC, when medically stable, discharge to SNF in [place] area to be near patient's children.   Symptom Management: Per Primary team Palliative medicine is available to assist as needed.      Time Spent: 35 minutes  Detailed review of medical records (labs, imaging, vital signs), medically appropriate exam, discussed with treatment team, counseling and education to patient, family, & staff, documenting clinical information, coordination of care.   ______________________________________________________________________________________ Kathlyne Bolder NP-C [place] Palliative Medicine Team Team Cell Phone: [phone] Please utilize secure chat with additional questions, if there is no response within 30 minutes please call the above phone number  Palliative Medicine Team providers are available by phone from 7am to 7pm daily and can be reached through the team cell phone.  Should this patient require assistance outside of these hours, please call the patient's attending physician.

## 2023-11-17 NOTE — Progress Notes (Signed)
 [facility]  PROGRESS NOTE  DARCELLE HERRADA FMW:[id number] DOB: 1970/04/10 DOA: 10/03/2023 PCP: Edman Meade PEDLAR, FNP   Brief HPI:   53 y.o. female with a history of decompensated liver cirrhosis, PUD, gastric ulcer perforation status post ex lap, GERD, pancreatitis, obstructive sleep apnea. Patient presented secondary to nausea, coffee-ground emesis, bloody diarrhea with concern for upper GI bleed. 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 Hospitalization complicated by hepatic encephalopathy treated with lactulose and rifaximin. Patient also requiring recurrent paracenteses for her recurrent ascites.       Assessment/Plan:   Decompensated alcoholic cirrhosis with ascites Patient treated empirically with Ceftriaxone  for possible SBP, although ascites fluid cell count not consistent with diagnosis. Palliative care consulted for ongoing goals of care discussions. -Continue to treat hepatic encephalopathy -s/p repeat therapeutic paracentesis 11/3 yielding 750cc ascites. Fluid analysis not suggestive of SBP   Hepatic encephalopathy Improved with lactulose and Rifaximin. Patient's insurance will not cover Rifaximin on discharge. -Reported over 6 BM over the weekend, thus decreased lactulose and stopped rifaximin -Pt was much more lethargic after lactulose reduced and rifaximin stopped -Mentation now much better with lactulose to 20g TID and resumption of rifiaximin   Transient alteration of awareness Patient developed significant change in mental status and was difficult to arose. PCCM was consulted for concern that patient would continue to decompensate. Patient spontaneously improved about 30 minutes later. Unclear etiology. She does not appear to have any post-ictal type presentation. She had an elevated temperature; no leukocytosis.  Also complicated by patient's hyponatremia. Blood cultures with no growth to date.    Hypoalbuminemia Secondary to underlying liver disease.   Thrombocytopenia Secondary to underlying liver disease.    AKI Presumed secondary to ATN from contrast. Nephrology was consulted for management. Renal function improved. Patient is not an HD candidate.   Appendicitis General surgery consulted. Patient high-risk for surgical management with recommendation for conservative management. Patient completed antibiotic regimen.   Chronic hypotension Worse this morning. Associated drop in hemoglobin, which may be etiology. Patient has received albumin  IV intermittently. -Continue midodrine  15 mg TID   Chronic hyponatremia Patient's baseline appears to be around 125 -128.  Sodium has significantly worsened. Nephrology consulted on 10/24. Lasix started with initial improvement, however sodium is back down to 118 today. -Continue fluid restriction (1,200 mL) -Nephrology recommendations (10/27): Lasix IV BID -Na improved to 131. Main issue is drinking too much fluids per Nephro -Nephro has since signed off 10/30 -Na down to 122. Main issue is hypervolemia as pt had not been adhering to vol restriction - Sodium has improved to 125 -Follow serum sodium level in a.m.   Acute upper GI bleed Acute esophageal necrosis Acute blood loss anemia Patient seen and evaluated by gastroenterology. Upper GI endoscopy performed on 9/25 with evidence of grade D esophagitis in addition to concern for acute esophageal necrosis. Non-bleeding gastric ulcer noted with sutures. For esophageal necrosis, GI recommended carafate  for two weeks in addition to Protonix  BID and outpatient GI follow-up. Patient with recurrent anemia on 10/24 without obvious hemorrhaging noted and again on 11/1 -Total received 4 units PRBC's this admit   Abdominal pain Presumed secondary to abdominal distension from ascites.  -improved after  paracentesis 11/3   Lower extremity edema VTE ruled out. Likely related to hypoalbuminemia from liver disease.   Hypervolemic hyponatremia.   Hypomagnesemia Low. Replaced   Hypokalemia Remains low, replaced  Tobacco abuse -Continue nicotine  patch   Alcohol abuse Noted.   Severe malnutrition -Dietitian recommendations (10/21): Continue current diet as ordered Encourage PO intake Continue ordering assistance Continue Ensure Plus High Protein po TID, each supplement provides 350 kcal and 20 grams of protein. 1 packet Juven BID, each packet provides 95 calories, 2.5 grams of protein (collagen),  to support wound healing Continue MVI with minerals daily, thiamine  and folic acid  for alcohol use history   Goals of care Goals noted for rehab at SNF. TOC consulted and following   Pressure injury Mid sacrum. Unclear if present on admission.          DVT prophylaxis: SCDs  Medications     sodium chloride    Intravenous Once   calcium  carbonate  1 tablet Oral TID WC   feeding supplement  237 mL Oral TID BM   folic acid   1 mg Oral Daily   furosemide  40 mg Intravenous BID   gabapentin   300 mg Oral TID   lactulose  20 g Oral TID   lidocaine   2 patch Transdermal Q24H   lidocaine  (PF)  10 mL Other Once   midodrine   20 mg Oral TID WC   multivitamin with minerals  1 tablet Oral Daily   nutrition supplement (JUVEN)  1 packet Oral BID BM   octreotide   100 mcg Subcutaneous Q8H   pantoprazole   40 mg Oral BID   rifaximin  550 mg Oral BID   thiamine   100 mg Oral Daily     Data Reviewed:   CBG:  No results for input(s): GLUCAP in the last 168 hours.  SpO2: 100 %    Vitals:   11/16/23 1533 11/16/23 2016 11/17/23 0002 11/17/23 0800  BP: 105/65 (!) 100/56 94/62 94/64   Pulse: 86 91 83 82  Resp: 18   16  Temp: 98.7 F (37.1 C) 98.9 F (37.2 C) 98.7 F (37.1 C) 98.5 F (36.9 C)  TempSrc: Oral Oral Oral   SpO2: 100% 99% 100% 100%  Weight:      Height:           Data Reviewed:  Basic Metabolic Panel: Recent Labs  Lab 11/11/23 0258 11/12/23 0252 11/13/23 0153 11/14/23 0140  NA 124* 122* 126* 125*  K 2.6* 3.3* 2.5* 4.3  CL 91* 87* 88* 89*  CO2 22 25 27 27   GLUCOSE 102* 122* 106* 103*  BUN 20 15 43* 22*  CREATININE 0.59 0.52 0.68 0.50  CALCIUM  7.9* 7.9* 8.0* 8.0*  MG 1.6* 1.8 1.5*  --     CBC: Recent Labs  Lab 11/10/23 1806 11/11/23 0258 11/12/23 0252 11/13/23 0153 11/14/23 0140  WBC  --  8.2 8.5 8.6 7.0  HGB 9.0* 8.8* 8.6* 8.6* 9.1*  HCT 25.7* 24.4* 23.8* 24.2* 25.7*  MCV  --  93.5 93.3 94.2 95.2  PLT  --  116* 116* 122* 130*    LFT Recent Labs  Lab 11/11/23 0258 11/12/23 0252 11/13/23 0153 11/14/23 0140  AST 77* 79* 76* 71*  ALT 25 20 24 23   ALKPHOS 57 61 57 51  BILITOT 3.2* 2.7* 2.7* 2.5*  PROT 6.5 6.4* 6.4* 6.2*  ALBUMIN  2.4* 2.5* 2.3* 2.3*     Antibiotics: Anti-infectives (From admission, onward)    Start     Dose/Rate Route Frequency Ordered Stop   11/12/23 1145  rifaximin (XIFAXAN) tablet 550 mg        550 mg Oral 2 times daily 11/12/23 1058  10/29/23 0000  rifaximin (XIFAXAN) 550 MG TABS tablet        550 mg Oral 2 times daily 10/29/23 1129     10/25/23 1415  rifaximin (XIFAXAN) tablet 550 mg  Status:  Discontinued        550 mg Oral 2 times daily 10/25/23 1315 11/10/23 1244   10/12/23 1000  metroNIDAZOLE  (FLAGYL ) tablet 500 mg        500 mg Oral Every 12 hours 10/12/23 0734 10/22/23 2125   10/12/23 0830  cefTRIAXone  (ROCEPHIN ) 2 g in sodium chloride  0.9 % 100 mL IVPB        2 g 200 mL/hr over 30 Minutes Intravenous Every 24 hours 10/12/23 0734 10/22/23 1636   10/07/23 1000  fluconazole  (DIFLUCAN ) tablet 100 mg  Status:  Discontinued        100 mg Oral Daily 10/07/23 0853 10/07/23 0855   10/07/23 1000  fluconazole  (DIFLUCAN ) tablet 150 mg        150 mg Oral Daily 10/07/23 0855 10/07/23 1014   10/04/23 1000  cefTRIAXone  (ROCEPHIN ) 2 g in sodium chloride  0.9 % 100 mL IVPB        2 g 200  mL/hr over 30 Minutes Intravenous Every 24 hours 10/03/23 2154 10/08/23 0854   10/03/23 2015  cefTRIAXone  (ROCEPHIN ) 1 g in sodium chloride  0.9 % 100 mL IVPB        1 g 200 mL/hr over 30 Minutes Intravenous  Once 10/03/23 2010 10/03/23 2212        CONSULTS gastroenterology, general surgery, palliative care  Code Status: Full code  Family Communication: No family present at bedside     Subjective   Denies any complaints   Objective    Physical Examination:  Appears in no acute distress S1-S2, regular Lungs clear to auscultation bilaterally Abdomen is soft, distended, no organomegaly No edema in the lower extremities  Status is: Inpatient:      Wound 10/15/23 0900 Pressure Injury Sacrum Mid Stage 2 -  Partial thickness loss of dermis presenting as a shallow open injury with a red, pink wound bed without slough. (Active)        Sabas GORMAN Brod   [facility] If 7PM-7AM, please contact night-coverage at [email or web address], Office  [phone]   11/17/2023, 10:16 AM  LOS: 45 days

## 2023-11-18 DIAGNOSIS — E876 Hypokalemia: Secondary | ICD-10-CM | POA: Diagnosis not present

## 2023-11-18 DIAGNOSIS — E871 Hypo-osmolality and hyponatremia: Secondary | ICD-10-CM | POA: Diagnosis not present

## 2023-11-18 DIAGNOSIS — R197 Diarrhea, unspecified: Secondary | ICD-10-CM | POA: Diagnosis not present

## 2023-11-18 DIAGNOSIS — R112 Nausea with vomiting, unspecified: Secondary | ICD-10-CM | POA: Diagnosis not present

## 2023-11-18 DIAGNOSIS — D62 Acute posthemorrhagic anemia: Secondary | ICD-10-CM | POA: Diagnosis not present

## 2023-11-18 DIAGNOSIS — K7290 Hepatic failure, unspecified without coma: Secondary | ICD-10-CM | POA: Diagnosis not present

## 2023-11-18 DIAGNOSIS — Z7189 Other specified counseling: Secondary | ICD-10-CM | POA: Diagnosis not present

## 2023-11-18 DIAGNOSIS — K7460 Unspecified cirrhosis of liver: Secondary | ICD-10-CM | POA: Diagnosis not present

## 2023-11-18 DIAGNOSIS — K922 Gastrointestinal hemorrhage, unspecified: Secondary | ICD-10-CM | POA: Diagnosis not present

## 2023-11-18 DIAGNOSIS — Z515 Encounter for palliative care: Secondary | ICD-10-CM | POA: Diagnosis not present

## 2023-11-18 LAB — COMPREHENSIVE METABOLIC PANEL WITH GFR
ALT: 25 U/L (ref 0–44)
AST: 73 U/L — ABNORMAL HIGH (ref 15–41)
Albumin: 2.5 g/dL — ABNORMAL LOW (ref 3.5–5.0)
Alkaline Phosphatase: 60 U/L (ref 38–126)
Anion gap: 12 (ref 5–15)
BUN: 23 mg/dL — ABNORMAL HIGH (ref 6–20)
CO2: 28 mmol/L (ref 22–32)
Calcium: 8.4 mg/dL — ABNORMAL LOW (ref 8.9–10.3)
Chloride: 85 mmol/L — ABNORMAL LOW (ref 98–111)
Creatinine, Ser: 0.56 mg/dL (ref 0.44–1.00)
GFR, Estimated: >60 mL/min (ref >60)
Glucose, Bld: 106 mg/dL — ABNORMAL HIGH (ref 70–99)
Potassium: 2.8 mmol/L — ABNORMAL LOW (ref 3.5–5.1)
Sodium: 125 mmol/L — ABNORMAL LOW (ref 135–145)
Total Bilirubin: 2.3 mg/dL — ABNORMAL HIGH (ref 0.0–1.2)
Total Protein: 6.8 g/dL (ref 6.5–8.1)

## 2023-11-18 LAB — PROTIME-INR
INR: 1.6 — ABNORMAL HIGH (ref 0.8–1.2)
Prothrombin Time: 19.5 s — ABNORMAL HIGH (ref 11.4–15.2)

## 2023-11-18 MED ORDER — POTASSIUM CHLORIDE 20 MEQ PO PACK
40.0000 meq | PACK | ORAL | Status: AC
  Administered 2023-11-18 – 2023-11-19 (×4): 40 meq via ORAL
  Filled 2023-11-18 (×4): qty 2

## 2023-11-18 NOTE — Progress Notes (Signed)
 [facility]  PROGRESS NOTE  Joan Wood FMW:[id number] DOB: 01/18/1970 DOA: 10/03/2023 PCP: Edman Meade PEDLAR, FNP   Brief HPI:   53 y.o. female with a history of decompensated liver cirrhosis, PUD, gastric ulcer perforation status post ex lap, GERD, pancreatitis, obstructive sleep apnea. Patient presented secondary to nausea, coffee-ground emesis, bloody diarrhea with concern for upper GI bleed. 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 Hospitalization complicated by hepatic encephalopathy treated with lactulose and rifaximin. Patient also requiring recurrent paracenteses for her recurrent ascites.       Assessment/Plan:   Decompensated alcoholic cirrhosis with ascites Patient treated empirically with Ceftriaxone  for possible SBP, although ascites fluid cell count not consistent with diagnosis. Palliative care consulted for ongoing goals of care discussions. -Continue to treat hepatic encephalopathy -s/p repeat therapeutic paracentesis 11/3 yielding 750cc ascites. Fluid analysis not suggestive of SBP -Likely will need another paracentesis in a.m.   Hepatic encephalopathy Improved with lactulose and Rifaximin. Patient's insurance will not cover Rifaximin on discharge. -Reported over 6 BM over the weekend, thus decreased lactulose and stopped rifaximin -Pt was much more lethargic after lactulose reduced and rifaximin stopped -Mentation now much better with lactulose to 20g TID and resumption of rifiaximin   Transient alteration of awareness Patient developed significant change in mental status and was difficult to arose. PCCM was consulted for concern that patient would continue to decompensate. Patient spontaneously improved about 30 minutes later. Unclear etiology. She does not appear to have any post-ictal type presentation. She  had an elevated temperature; no leukocytosis. Also complicated by patient's hyponatremia. Blood cultures with no growth to date.    Hypoalbuminemia Secondary to underlying liver disease.   Thrombocytopenia Secondary to underlying liver disease.    AKI Presumed secondary to ATN from contrast. Nephrology was consulted for management. Renal function improved. Patient is not an HD candidate.   Appendicitis General surgery consulted. Patient high-risk for surgical management with recommendation for conservative management. Patient completed antibiotic regimen.   Chronic hypotension Worse this morning. Associated drop in hemoglobin, which may be etiology. Patient has received albumin  IV intermittently. -Continue midodrine  15 mg TID   Chronic hyponatremia Patient's baseline appears to be around 125 -128.  Sodium has significantly worsened. Nephrology consulted on 10/24. Lasix started with initial improvement, however sodium is back down to 118 today. -Continue fluid restriction (1,200 mL) -Nephrology recommendations (10/27): Lasix IV BID -Na improved to 131. Main issue is drinking too much fluids per Nephro -Nephro has since signed off 10/30 -Na down to 122. Main issue is hypervolemia as pt had not been adhering to vol restriction - Sodium has improved to 125 -Follow serum sodium level in a.m.   Acute upper GI bleed Acute esophageal necrosis Acute blood loss anemia Patient seen and evaluated by gastroenterology. Upper GI endoscopy performed on 9/25 with evidence of grade D esophagitis in addition to concern for acute esophageal necrosis. Non-bleeding gastric ulcer noted with sutures. For esophageal necrosis, GI recommended carafate  for two weeks in addition to Protonix  BID and outpatient GI follow-up. Patient with recurrent anemia on 10/24 without obvious hemorrhaging noted and again on 11/1 -Total received 4 units PRBC's this admit   Abdominal pain Presumed secondary to abdominal  distension from ascites.  -improved after paracentesis 11/3   Lower extremity edema VTE ruled out. Likely related to hypoalbuminemia from liver disease.   Hypervolemic hyponatremia.   Hypomagnesemia Low. Replaced  Hypokalemia Remains low,  - Potassium was 2.8 today. -Will replace potassium and follow BMP in am   Tobacco abuse -Continue nicotine  patch   Alcohol abuse Noted.   Severe malnutrition -Dietitian recommendations (10/21): Continue current diet as ordered Encourage PO intake Continue ordering assistance Continue Ensure Plus High Protein po TID, each supplement provides 350 kcal and 20 grams of protein. 1 packet Juven BID, each packet provides 95 calories, 2.5 grams of protein (collagen),  to support wound healing Continue MVI with minerals daily, thiamine  and folic acid  for alcohol use history   Goals of care Goals noted for rehab at SNF. TOC consulted and following   Pressure injury Mid sacrum. Unclear if present on admission.          DVT prophylaxis: SCDs  Medications     sodium chloride    Intravenous Once   calcium  carbonate  1 tablet Oral TID WC   feeding supplement  237 mL Oral TID BM   folic acid   1 mg Oral Daily   furosemide  40 mg Intravenous BID   gabapentin   300 mg Oral TID   lactulose  20 g Oral TID   lidocaine   2 patch Transdermal Q24H   lidocaine  (PF)  10 mL Other Once   midodrine   20 mg Oral TID WC   multivitamin with minerals  1 tablet Oral Daily   nutrition supplement (JUVEN)  1 packet Oral BID BM   octreotide   100 mcg Subcutaneous Q8H   pantoprazole   40 mg Oral BID   rifaximin  550 mg Oral BID   thiamine   100 mg Oral Daily     Data Reviewed:   CBG:  No results for input(s): GLUCAP in the last 168 hours.  SpO2: 96 %    Vitals:   11/17/23 1945 11/17/23 2344 11/18/23 0412 11/18/23 0751  BP: 112/67 (!) 94/57 103/62 104/71  Pulse: 83 95 92 96  Resp: 17 18 17 18   Temp: 98.5 F (36.9 C) 98.6 F (37 C) 99 F (37.2  C) 98.6 F (37 C)  TempSrc: Oral Oral  Oral  SpO2: 99% 98% 96% 96%  Weight:      Height:          Data Reviewed:  Basic Metabolic Panel: Recent Labs  Lab 11/12/23 0252 11/13/23 0153 11/14/23 0140 11/18/23 0134  NA 122* 126* 125* 125*  K 3.3* 2.5* 4.3 2.8*  CL 87* 88* 89* 85*  CO2 25 27 27 28   GLUCOSE 122* 106* 103* 106*  BUN 15 43* 22* 23*  CREATININE 0.52 0.68 0.50 0.56  CALCIUM  7.9* 8.0* 8.0* 8.4*  MG 1.8 1.5*  --   --     CBC: Recent Labs  Lab 11/12/23 0252 11/13/23 0153 11/14/23 0140  WBC 8.5 8.6 7.0  HGB 8.6* 8.6* 9.1*  HCT 23.8* 24.2* 25.7*  MCV 93.3 94.2 95.2  PLT 116* 122* 130*    LFT Recent Labs  Lab 11/12/23 0252 11/13/23 0153 11/14/23 0140 11/18/23 0134  AST 79* 76* 71* 73*  ALT 20 24 23 25   ALKPHOS 61 57 51 60  BILITOT 2.7* 2.7* 2.5* 2.3*  PROT 6.4* 6.4* 6.2* 6.8  ALBUMIN  2.5* 2.3* 2.3* 2.5*     Antibiotics: Anti-infectives (From admission, onward)    Start     Dose/Rate Route Frequency Ordered Stop   11/12/23 1145  rifaximin (XIFAXAN) tablet 550 mg        550 mg Oral 2 times daily 11/12/23 1058  10/29/23 0000  rifaximin (XIFAXAN) 550 MG TABS tablet        550 mg Oral 2 times daily 10/29/23 1129     10/25/23 1415  rifaximin (XIFAXAN) tablet 550 mg  Status:  Discontinued        550 mg Oral 2 times daily 10/25/23 1315 11/10/23 1244   10/12/23 1000  metroNIDAZOLE  (FLAGYL ) tablet 500 mg        500 mg Oral Every 12 hours 10/12/23 0734 10/22/23 2125   10/12/23 0830  cefTRIAXone  (ROCEPHIN ) 2 g in sodium chloride  0.9 % 100 mL IVPB        2 g 200 mL/hr over 30 Minutes Intravenous Every 24 hours 10/12/23 0734 10/22/23 1636   10/07/23 1000  fluconazole  (DIFLUCAN ) tablet 100 mg  Status:  Discontinued        100 mg Oral Daily 10/07/23 0853 10/07/23 0855   10/07/23 1000  fluconazole  (DIFLUCAN ) tablet 150 mg        150 mg Oral Daily 10/07/23 0855 10/07/23 1014   10/04/23 1000  cefTRIAXone  (ROCEPHIN ) 2 g in sodium chloride  0.9 % 100 mL IVPB         2 g 200 mL/hr over 30 Minutes Intravenous Every 24 hours 10/03/23 2154 10/08/23 0854   10/03/23 2015  cefTRIAXone  (ROCEPHIN ) 1 g in sodium chloride  0.9 % 100 mL IVPB        1 g 200 mL/hr over 30 Minutes Intravenous  Once 10/03/23 2010 10/03/23 2212        CONSULTS gastroenterology, general surgery, palliative care  Code Status: Full code  Family Communication: No family present at bedside     Subjective   Denies any complaints.   Objective    Physical Examination:  Appears in no acute distress Somnolent but arousable Abdomen is distended, nontender Extremities trace edema bilaterally  Status is: Inpatient:      Wound 10/15/23 0900 Pressure Injury Sacrum Mid Stage 2 -  Partial thickness loss of dermis presenting as a shallow open injury with a red, pink wound bed without slough. (Active)        Sabas GORMAN Brod   [facility] If 7PM-7AM, please contact night-coverage at [email or web address], Office  [phone]   11/18/2023, 11:58 AM  LOS: 46 days

## 2023-11-19 ENCOUNTER — Ambulatory Visit: Admitting: Cardiology

## 2023-11-19 ENCOUNTER — Inpatient Hospital Stay (HOSPITAL_COMMUNITY)

## 2023-11-19 DIAGNOSIS — K2289 Other specified disease of esophagus: Secondary | ICD-10-CM | POA: Diagnosis not present

## 2023-11-19 DIAGNOSIS — K7290 Hepatic failure, unspecified without coma: Secondary | ICD-10-CM | POA: Diagnosis not present

## 2023-11-19 DIAGNOSIS — K3580 Unspecified acute appendicitis: Secondary | ICD-10-CM | POA: Diagnosis not present

## 2023-11-19 DIAGNOSIS — R188 Other ascites: Secondary | ICD-10-CM | POA: Diagnosis not present

## 2023-11-19 DIAGNOSIS — R140 Abdominal distension (gaseous): Secondary | ICD-10-CM | POA: Diagnosis not present

## 2023-11-19 DIAGNOSIS — K922 Gastrointestinal hemorrhage, unspecified: Secondary | ICD-10-CM | POA: Diagnosis not present

## 2023-11-19 DIAGNOSIS — R300 Dysuria: Secondary | ICD-10-CM

## 2023-11-19 LAB — URINALYSIS, ROUTINE W REFLEX MICROSCOPIC
Bilirubin Urine: NEGATIVE
Glucose, UA: NEGATIVE mg/dL
Hgb urine dipstick: NEGATIVE
Ketones, ur: NEGATIVE mg/dL
Nitrite: NEGATIVE
Protein, ur: NEGATIVE mg/dL
Specific Gravity, Urine: 1.006 (ref 1.005–1.030)
WBC, UA: >50 WBC/hpf (ref 0–5)
pH: 8 (ref 5.0–8.0)

## 2023-11-19 LAB — BASIC METABOLIC PANEL WITH GFR
Anion gap: 9 (ref 5–15)
BUN: 30 mg/dL — ABNORMAL HIGH (ref 6–20)
CO2: 26 mmol/L (ref 22–32)
Calcium: 8.5 mg/dL — ABNORMAL LOW (ref 8.9–10.3)
Chloride: 91 mmol/L — ABNORMAL LOW (ref 98–111)
Creatinine, Ser: 0.69 mg/dL (ref 0.44–1.00)
GFR, Estimated: >60 mL/min (ref >60)
Glucose, Bld: 88 mg/dL (ref 70–99)
Potassium: 5.6 mmol/L — ABNORMAL HIGH (ref 3.5–5.1)
Sodium: 126 mmol/L — ABNORMAL LOW (ref 135–145)

## 2023-11-19 LAB — ACID FAST CULTURE WITH REFLEXED SENSITIVITIES (MYCOBACTERIA): Acid Fast Culture: NEGATIVE

## 2023-11-19 LAB — PROTIME-INR
INR: 1.6 — ABNORMAL HIGH (ref 0.8–1.2)
Prothrombin Time: 19.6 s — ABNORMAL HIGH (ref 11.4–15.2)

## 2023-11-19 MED ORDER — OCTREOTIDE ACETATE 100 MCG/ML IJ SOLN
100.0000 ug | Freq: Two times a day (BID) | INTRAMUSCULAR | Status: AC
Start: 2023-11-19 — End: 2023-11-21
  Administered 2023-11-19 – 2023-11-20 (×3): 100 ug via SUBCUTANEOUS
  Filled 2023-11-19 (×3): qty 1

## 2023-11-19 MED ORDER — FUROSEMIDE 40 MG PO TABS
40.0000 mg | ORAL_TABLET | Freq: Two times a day (BID) | ORAL | Status: DC
  Administered 2023-11-19 – 2024-01-22 (×129): 40 mg via ORAL
  Filled 2023-11-19 (×114): qty 1

## 2023-11-19 MED ORDER — LIDOCAINE-EPINEPHRINE 1 %-1:100000 IJ SOLN
INTRAMUSCULAR | Status: AC
  Filled 2023-11-19: qty 1

## 2023-11-19 MED ORDER — OCTREOTIDE ACETATE 100 MCG/ML IJ SOLN
50.0000 ug | Freq: Two times a day (BID) | INTRAMUSCULAR | Status: AC
  Administered 2023-11-21 – 2023-11-22 (×3): 50 ug via SUBCUTANEOUS
  Filled 2023-11-19 (×4): qty 0.5

## 2023-11-19 NOTE — Progress Notes (Signed)
 [facility]  PROGRESS NOTE  Joan Wood FMW:[id number] DOB: Jun 30, 1970 DOA: 10/03/2023 PCP: Edman Meade PEDLAR, FNP   Brief HPI:   53 y.o. female with a history of decompensated liver cirrhosis, PUD, gastric ulcer perforation status post ex lap, GERD, pancreatitis, obstructive sleep apnea. Patient presented secondary to nausea, coffee-ground emesis, bloody diarrhea with concern for upper GI bleed. 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 Hospitalization complicated by hepatic encephalopathy treated with lactulose and rifaximin. Patient also requiring recurrent paracenteses for her recurrent ascites.       Assessment/Plan:   Decompensated alcoholic cirrhosis with ascites Patient treated empirically with Ceftriaxone  for possible SBP, although ascites fluid cell count not consistent with diagnosis. Palliative care consulted for ongoing goals of care discussions. -Continue to treat hepatic encephalopathy -s/p repeat therapeutic paracentesis 11/3 yielding 750cc ascites. Fluid analysis not suggestive of SBP - She has significant abdominal distention, will order paracentesis   Hepatic encephalopathy Improved with lactulose and Rifaximin. Patient's insurance will not cover Rifaximin on discharge. -Reported over 6 BM over the weekend, thus decreased lactulose and stopped rifaximin -Pt was much more lethargic after lactulose reduced and rifaximin stopped -Mentation now much better with lactulose to 20g TID and resumption of rifiaximin   Transient alteration of awareness Patient developed significant change in mental status and was difficult to arose. PCCM was consulted for concern that patient would continue to decompensate. Patient spontaneously improved about 30 minutes later. Unclear etiology. She does not appear to have any post-ictal  type presentation. She had an elevated temperature; no leukocytosis. Also complicated by patient's hyponatremia. Blood cultures with no growth to date.  Dysuria - Patient has dysuria - Will obtain urine culture    Hypoalbuminemia Secondary to underlying liver disease.   Thrombocytopenia Secondary to underlying liver disease.    AKI Presumed secondary to ATN from contrast. Nephrology was consulted for management. Renal function improved. Patient is not an HD candidate.   Appendicitis General surgery consulted. Patient high-risk for surgical management with recommendation for conservative management. Patient completed antibiotic regimen.   Chronic hypotension Worse this morning. Associated drop in hemoglobin, which may be etiology. Patient has received albumin  IV intermittently. -Continue midodrine  15 mg TID -She was started on octreotide  100 mg every 8 hours as per nephrology on 11/02/2023; I will taper off acute hide over next 3 days   Chronic hyponatremia Patient's baseline appears to be around 125 -128.  Sodium has significantly worsened. Nephrology consulted on 10/24. Lasix started with initial improvement, however sodium is back down to 118 today. -Continue fluid restriction (1,200 mL) -Nephrology recommendations (10/27): Lasix IV BID -Na improved to 131. Main issue is drinking too much fluids per Nephro -Nephro has since signed off 10/30 -Na down to 122. Main issue is hypervolemia as pt had not been adhering to vol restriction - Sodium has improved to 125 -Follow serum sodium level in a.m.   Acute upper GI bleed Acute esophageal necrosis Acute blood loss anemia Patient seen and evaluated by gastroenterology. Upper GI endoscopy performed on 9/25 with evidence of grade D esophagitis in addition to concern for acute esophageal necrosis. Non-bleeding gastric ulcer noted with sutures. For esophageal necrosis, GI recommended carafate  for two weeks in addition to Protonix  BID and  outpatient GI follow-up. Patient with recurrent anemia on 10/24 without obvious hemorrhaging noted and again on 11/1 -Total received 4 units PRBC's this admit  Lower extremity edema VTE ruled out. Likely related to hypoalbuminemia from liver disease.      Hypokalemia Remains low,  - Potassium was 2.8 today. - Resolved  Mild hyperkalemia -Potassium is 5.6, likely in setting of replacement as above -Follow serum potassium in a.m.   Tobacco abuse -Continue nicotine  patch   Alcohol abuse Noted.   Severe malnutrition -Dietitian recommendations (10/21): Continue current diet as ordered Encourage PO intake Continue ordering assistance Continue Ensure Plus High Protein po TID, each supplement provides 350 kcal and 20 grams of protein. 1 packet Juven BID, each packet provides 95 calories, 2.5 grams of protein (collagen),  to support wound healing Continue MVI with minerals daily, thiamine  and folic acid  for alcohol use history   Goals of care Goals noted for rehab at SNF. TOC consulted and following   Pressure injury Mid sacrum. Unclear if present on admission.          DVT prophylaxis: SCDs  Medications     sodium chloride    Intravenous Once   calcium  carbonate  1 tablet Oral TID WC   feeding supplement  237 mL Oral TID BM   folic acid   1 mg Oral Daily   furosemide  40 mg Intravenous BID   gabapentin   300 mg Oral TID   lactulose  20 g Oral TID   lidocaine   2 patch Transdermal Q24H   lidocaine  (PF)  10 mL Other Once   midodrine   20 mg Oral TID WC   multivitamin with minerals  1 tablet Oral Daily   nutrition supplement (JUVEN)  1 packet Oral BID BM   octreotide   100 mcg Subcutaneous Q8H   pantoprazole   40 mg Oral BID   rifaximin  550 mg Oral BID   thiamine   100 mg Oral Daily     Data Reviewed:   CBG:  No results for input(s): GLUCAP in the last 168 hours.  SpO2: 99 %    Vitals:   11/18/23 2027 11/19/23 0004 11/19/23 0500 11/19/23 0552  BP:  101/64 116/75  103/65  Pulse: 82 85  90  Resp: 18 18  18   Temp: 97.8 F (36.6 C) 98.4 F (36.9 C)  98.4 F (36.9 C)  TempSrc: Oral   Oral  SpO2: 100% 100%  99%  Weight:   56.1 kg   Height:          Data Reviewed:  Basic Metabolic Panel: Recent Labs  Lab 11/13/23 0153 11/14/23 0140 11/18/23 0134 11/19/23 0301  NA 126* 125* 125* 126*  K 2.5* 4.3 2.8* 5.6*  CL 88* 89* 85* 91*  CO2 27 27 28 26   GLUCOSE 106* 103* 106* 88  BUN 43* 22* 23* 30*  CREATININE 0.68 0.50 0.56 0.69  CALCIUM  8.0* 8.0* 8.4* 8.5*  MG 1.5*  --   --   --     CBC: Recent Labs  Lab 11/13/23 0153 11/14/23 0140  WBC 8.6 7.0  HGB 8.6* 9.1*  HCT 24.2* 25.7*  MCV 94.2 95.2  PLT 122* 130*    LFT Recent Labs  Lab 11/13/23 0153 11/14/23 0140 11/18/23 0134  AST 76* 71* 73*  ALT 24 23 25   ALKPHOS 57 51 60  BILITOT 2.7* 2.5* 2.3*  PROT 6.4* 6.2* 6.8  ALBUMIN  2.3* 2.3* 2.5*     Antibiotics: Anti-infectives (From admission, onward)    Start     Dose/Rate Route Frequency Ordered Stop   11/12/23 1145  rifaximin (XIFAXAN) tablet 550 mg  550 mg Oral 2 times daily 11/12/23 1058     10/29/23 0000  rifaximin (XIFAXAN) 550 MG TABS tablet        550 mg Oral 2 times daily 10/29/23 1129     10/25/23 1415  rifaximin (XIFAXAN) tablet 550 mg  Status:  Discontinued        550 mg Oral 2 times daily 10/25/23 1315 11/10/23 1244   10/12/23 1000  metroNIDAZOLE  (FLAGYL ) tablet 500 mg        500 mg Oral Every 12 hours 10/12/23 0734 10/22/23 2125   10/12/23 0830  cefTRIAXone  (ROCEPHIN ) 2 g in sodium chloride  0.9 % 100 mL IVPB        2 g 200 mL/hr over 30 Minutes Intravenous Every 24 hours 10/12/23 0734 10/22/23 1636   10/07/23 1000  fluconazole  (DIFLUCAN ) tablet 100 mg  Status:  Discontinued        100 mg Oral Daily 10/07/23 0853 10/07/23 0855   10/07/23 1000  fluconazole  (DIFLUCAN ) tablet 150 mg        150 mg Oral Daily 10/07/23 0855 10/07/23 1014   10/04/23 1000  cefTRIAXone  (ROCEPHIN ) 2 g in sodium  chloride 0.9 % 100 mL IVPB        2 g 200 mL/hr over 30 Minutes Intravenous Every 24 hours 10/03/23 2154 10/08/23 0854   10/03/23 2015  cefTRIAXone  (ROCEPHIN ) 1 g in sodium chloride  0.9 % 100 mL IVPB        1 g 200 mL/hr over 30 Minutes Intravenous  Once 10/03/23 2010 10/03/23 2212        CONSULTS gastroenterology, general surgery, palliative care  Code Status: Full code  Family Communication: No family present at bedside     Subjective   Patient complains of dysuria and increased frequency of urination.  Also noted worsening abdominal distention   Objective    Physical Examination:  Appears in no acute distress Alert, and x 3, no focal deficit noted Abdomen is soft, distended,, no organomegaly, nontender to palpation Lungs are clear to auscultation bilaterally Extremities no edema  Status is: Inpatient:      Wound 10/15/23 0900 Pressure Injury Sacrum Mid Stage 2 -  Partial thickness loss of dermis presenting as a shallow open injury with a red, pink wound bed without slough. (Active)        Sabas GORMAN Brod   [facility] If 7PM-7AM, please contact night-coverage at [email or web address], Office  [phone]   11/19/2023, 8:55 AM  LOS: 47 days

## 2023-11-19 NOTE — TOC Progression Note (Signed)
 Transition of Care [name]    Patient Details  Name: Joan Wood MRN: [id number] Date of Birth: [birth year]  [facility]) CM/SW Contact  [name], [name] Phone Number: 11/19/2023, 1:04 PM  Clinical Narrative:   CSW updated by [name] that [name] in [place] can offer a bed for the patient. CSW also contacted the following facitilies: - [facility]: No beds - [facility]: Left a voicemail for [name] [phone]) - [facility]: No answer, will try again later - [facility]: No beds - [facility]: Out of network - [facility]: Left a engineer, technical sales for [name] [phone]) - [facility]: Out of network - [facility]: Left a engineer, technical sales for [name]  CSW spoke with son, [name], to update that [name] can offer a bed for the patient. Family to review.    Expected Discharge Plan: Skilled Nursing Facility Barriers to Discharge: Homeless with medical needs, Inadequate or no insurance, Continued Medical Work up, Unsafe home situation               Expected Discharge Plan and Services In-house Referral: Clinical Social Work Discharge Planning Services: CM Consult Post Acute Care Choice: Home Health Living arrangements for the past 2 months: Single Family Home                 DME Arranged: Walker rolling   Date DME Agency Contacted: 10/08/23   Representative spoke with at DME Agency: [name]             Social Drivers of Health (SDOH) Interventions SDOH Screenings   Food Insecurity: No Food Insecurity (10/03/2023)  Housing: Low Risk  (10/03/2023)  Transportation Needs: No Transportation Needs (10/03/2023)  Recent Concern: Transportation Needs - Unmet Transportation Needs (08/20/2023)  Utilities: Not At Risk (10/03/2023)  Depression (PHQ2-9): High Risk (07/30/2023)  Social Connections: Socially Isolated (10/03/2023)  Tobacco  Use: High Risk (10/04/2023)    Readmission Risk Interventions    10/04/2023    9:02 AM 09/03/2023   12:31 PM 09/02/2023    9:01 AM  Readmission Risk Prevention Plan  Transportation Screening Complete Complete Complete  PCP or Specialist Appt within 3-5 Days   Complete  HRI or Home Care Consult   Complete  Social Work Consult for Recovery Care Planning/Counseling   Complete  Palliative Care Screening   Not Applicable  Medication Review Oceanographer) Complete Complete Complete  HRI or Home Care Consult Complete Complete   SW Recovery Care/Counseling Consult Complete Complete   Palliative Care Screening Not Applicable Not Applicable   Skilled Nursing Facility Not Applicable Patient Refused

## 2023-11-19 NOTE — TOC Progression Note (Signed)
 Transition of Care [facility]) - Progression Note    Patient Details  Name: Joan Wood MRN: [id number] Date of Birth: 08-Aug-1970  Transition of Care [facility]) CM/SW Contact  [name], [name] Phone Number: 11/19/2023, 1:17 PM  Clinical Narrative:   CSW updated by patient's son, [name], that [place], as [place] is closer for the family. CSW made the following calls: - [facility]: Do not accept any outside referrals - [facility]: No answer, will try again - [facility]: Out of network - [facility]: Spoke with [name] [phone]), they would need 5 years of bank statements to consider the patient - [facility]: No answer, will try again [facility]: Left a voicemail for [name] [phone]) - [facility]: Left a voicemail for [name] in Admissions - [facility]: Left a voicemail for [name] in Admissions [facility]: Out of network - [facility]: Left a engineer, technical sales for [name] in Admissions -[facility]: No beds  CSW updated patient's son that she will try to locate something closer, but may not be successful and that [facility] may be the only option. CSW to follow.    Expected Discharge Plan: Skilled Nursing Facility Barriers to Discharge: Homeless with medical needs, Inadequate or no insurance, Continued Medical Work up, Unsafe home situation               Expected Discharge Plan and Services In-house Referral: Clinical Social Work Discharge Planning Services: CM Consult Post Acute Care Choice: Home Health Living arrangements for the past 2 months: Single Family Home                 DME Arranged: Walker rolling   Date DME Agency Contacted: 10/08/23   Representative spoke with at DME Agency: [name]             Social Drivers of Health (SDOH) Interventions SDOH Screenings   Food Insecurity:  No Food Insecurity (10/03/2023)  Housing: Low Risk  (10/03/2023)  Transportation Needs: No Transportation Needs (10/03/2023)  Recent Concern: Transportation Needs - Unmet Transportation Needs (08/20/2023)  Utilities: Not At Risk (10/03/2023)  Depression (PHQ2-9): High Risk (07/30/2023)  Social Connections: Socially Isolated (10/03/2023)  Tobacco Use: High Risk (10/04/2023)    Readmission Risk Interventions    10/04/2023    9:02 AM 09/03/2023   12:31 PM 09/02/2023    9:01 AM  Readmission Risk Prevention Plan  Transportation Screening Complete Complete Complete  PCP or Specialist Appt within 3-5 Days   Complete  HRI or Home Care Consult   Complete  Social Work Consult for Recovery Care Planning/Counseling   Complete  Palliative Care Screening   Not Applicable  Medication Review Oceanographer) Complete Complete Complete  HRI or Home Care Consult Complete Complete   SW Recovery Care/Counseling Consult Complete Complete   Palliative Care Screening Not Applicable Not Applicable   Skilled Nursing Facility Not Applicable Patient Refused

## 2023-11-19 NOTE — Plan of Care (Signed)
  Problem: Clinical Measurements: Goal: Respiratory complications will improve Outcome: Progressing Goal: Cardiovascular complication will be avoided Outcome: Progressing   Problem: Activity: Goal: Risk for activity intolerance will decrease Outcome: Progressing   Problem: Elimination: Goal: Will not experience complications related to bowel motility Outcome: Progressing Goal: Will not experience complications related to urinary retention Outcome: Progressing

## 2023-11-19 NOTE — Plan of Care (Signed)
°  Problem: Clinical Measurements: Goal: Will remain free from infection Outcome: Progressing   Problem: Activity: Goal: Risk for activity intolerance will decrease Outcome: Progressing   Problem: Nutrition: Goal: Adequate nutrition will be maintained Outcome: Progressing

## 2023-11-19 NOTE — Progress Notes (Signed)
 Patient presents for therapeutic  paracentesis. [place] limited abdomen shows trace amount of peritoneal fluid noted  Insufficient to perform a safe paracentesis. Procedure not performed.

## 2023-11-20 DIAGNOSIS — Z515 Encounter for palliative care: Secondary | ICD-10-CM | POA: Diagnosis not present

## 2023-11-20 DIAGNOSIS — N390 Urinary tract infection, site not specified: Secondary | ICD-10-CM

## 2023-11-20 DIAGNOSIS — K7290 Hepatic failure, unspecified without coma: Secondary | ICD-10-CM | POA: Diagnosis not present

## 2023-11-20 DIAGNOSIS — R531 Weakness: Secondary | ICD-10-CM | POA: Diagnosis not present

## 2023-11-20 DIAGNOSIS — R112 Nausea with vomiting, unspecified: Secondary | ICD-10-CM | POA: Diagnosis not present

## 2023-11-20 DIAGNOSIS — K922 Gastrointestinal hemorrhage, unspecified: Secondary | ICD-10-CM | POA: Diagnosis not present

## 2023-11-20 DIAGNOSIS — N179 Acute kidney failure, unspecified: Secondary | ICD-10-CM | POA: Diagnosis not present

## 2023-11-20 DIAGNOSIS — F1090 Alcohol use, unspecified, uncomplicated: Secondary | ICD-10-CM | POA: Diagnosis not present

## 2023-11-20 DIAGNOSIS — Z7189 Other specified counseling: Secondary | ICD-10-CM | POA: Diagnosis not present

## 2023-11-20 DIAGNOSIS — K7460 Unspecified cirrhosis of liver: Secondary | ICD-10-CM | POA: Diagnosis not present

## 2023-11-20 DIAGNOSIS — E43 Unspecified severe protein-calorie malnutrition: Secondary | ICD-10-CM | POA: Diagnosis not present

## 2023-11-20 LAB — BASIC METABOLIC PANEL WITH GFR
Anion gap: 13 (ref 5–15)
BUN: 25 mg/dL — ABNORMAL HIGH (ref 6–20)
CO2: 25 mmol/L (ref 22–32)
Calcium: 8.7 mg/dL — ABNORMAL LOW (ref 8.9–10.3)
Chloride: 90 mmol/L — ABNORMAL LOW (ref 98–111)
Creatinine, Ser: 0.54 mg/dL (ref 0.44–1.00)
GFR, Estimated: >60 mL/min (ref >60)
Glucose, Bld: 112 mg/dL — ABNORMAL HIGH (ref 70–99)
Potassium: 3.6 mmol/L (ref 3.5–5.1)
Sodium: 128 mmol/L — ABNORMAL LOW (ref 135–145)

## 2023-11-20 LAB — PROTIME-INR
INR: 1.7 — ABNORMAL HIGH (ref 0.8–1.2)
Prothrombin Time: 20.6 s — ABNORMAL HIGH (ref 11.4–15.2)

## 2023-11-20 MED ORDER — SODIUM CHLORIDE 0.9 % IV SOLN
1.0000 g | INTRAVENOUS | Status: DC
  Administered 2023-11-20: 1 g via INTRAVENOUS
  Filled 2023-11-20: qty 10

## 2023-11-20 NOTE — Progress Notes (Signed)
 This chaplain responded to [facility] page for F/U on creating the Pt. Advance Directive: HCPOA only. The Pt. answered the chaplain's clarifying questions after HCPOA education and is ready for notary services.  The Pt. named Joan Wood as her healthcare agent. If this person is unwilling or unable to serve in this role, the Pt. next choice is [name] and [name] in succession.  The chaplain is present with the Pt., notary, and witnesses for the notarizing  of the Pt. AD. The chaplain gave the Pt. the original AD along with three copies. The chaplain scanned the Pt. AD into the Pt. EMR.  This chaplain is available for F/U spiritual care as needed.  Chaplain [name] Hummer [phone]

## 2023-11-20 NOTE — Plan of Care (Signed)
  Problem: Health Behavior/Discharge Planning: Goal: Ability to manage health-related needs will improve 11/20/2023 0730 by Firman Alpha, RN Outcome: Progressing 11/20/2023 0729 by Firman Alpha, RN Outcome: Progressing 11/20/2023 0729 by Firman Alpha, RN Outcome: Progressing 11/20/2023 0728 by Firman Alpha, RN Outcome: Progressing   Problem: Clinical Measurements: Goal: Ability to maintain clinical measurements within normal limits will improve 11/20/2023 0730 by Firman Alpha, RN Outcome: Progressing 11/20/2023 0729 by Firman Alpha, RN Outcome: Progressing 11/20/2023 0729 by Firman Alpha, RN Outcome: Progressing 11/20/2023 0728 by Firman Alpha, RN Outcome: Progressing Goal: Will remain free from infection 11/20/2023 0730 by Firman Alpha, RN Outcome: Progressing 11/20/2023 0729 by Firman Alpha, RN Outcome: Progressing 11/20/2023 0729 by Firman Alpha, RN Outcome: Progressing 11/20/2023 0728 by Firman Alpha, RN Outcome: Progressing Goal: Diagnostic test results will improve 11/20/2023 0730 by Firman Alpha, RN Outcome: Progressing 11/20/2023 0729 by Firman Alpha, RN Outcome: Progressing 11/20/2023 0729 by Firman Alpha, RN Outcome: Progressing 11/20/2023 0728 by Firman Alpha, RN Outcome: Progressing Goal: Respiratory complications will improve 11/20/2023 0730 by Firman Alpha, RN Outcome: Progressing 11/20/2023 0729 by Firman Alpha, RN Outcome: Progressing 11/20/2023 0729 by Firman Alpha, RN Outcome: Progressing 11/20/2023 0728 by Firman Alpha, RN Outcome: Progressing Goal: Cardiovascular complication will be avoided 11/20/2023 0730 by Firman Alpha, RN Outcome: Progressing 11/20/2023 0729 by Firman Alpha, RN Outcome: Progressing 11/20/2023 0729 by Firman Alpha, RN Outcome: Progressing 11/20/2023 0728 by Firman Alpha,  RN Outcome: Progressing   Problem: Activity: Goal: Risk for activity intolerance will decrease 11/20/2023 0730 by Firman Alpha, RN Outcome: Progressing 11/20/2023 0729 by Firman Alpha, RN Outcome: Progressing 11/20/2023 0729 by Firman Alpha, RN Outcome: Progressing 11/20/2023 0728 by Firman Alpha, RN Outcome: Progressing   Problem: Nutrition: Goal: Adequate nutrition will be maintained 11/20/2023 0730 by Firman Alpha, RN Outcome: Progressing 11/20/2023 0729 by Firman Alpha, RN Outcome: Progressing 11/20/2023 0729 by Firman Alpha, RN Outcome: Progressing 11/20/2023 0728 by Firman Alpha, RN Outcome: Progressing   Problem: Coping: Goal: Level of anxiety will decrease 11/20/2023 0730 by Firman Alpha, RN Outcome: Progressing 11/20/2023 0729 by Firman Alpha, RN Outcome: Progressing 11/20/2023 0729 by Firman Alpha, RN Outcome: Progressing 11/20/2023 0728 by Firman Alpha, RN Outcome: Progressing   Problem: Elimination: Goal: Will not experience complications related to bowel motility 11/20/2023 0730 by Firman Alpha, RN Outcome: Progressing 11/20/2023 0729 by Firman Alpha, RN Outcome: Progressing 11/20/2023 0729 by Firman Alpha, RN Outcome: Progressing 11/20/2023 0728 by Firman Alpha, RN Outcome: Progressing Goal: Will not experience complications related to urinary retention 11/20/2023 0730 by Firman Alpha, RN Outcome: Progressing 11/20/2023 0729 by Firman Alpha, RN Outcome: Progressing 11/20/2023 0729 by Firman Alpha, RN Outcome: Progressing 11/20/2023 0728 by Firman Alpha, RN Outcome: Progressing   Problem: Pain Managment: Goal: General experience of comfort will improve and/or be controlled 11/20/2023 0730 by Firman Alpha, RN Outcome: Progressing 11/20/2023 0729 by Firman Alpha, RN Outcome: Progressing 11/20/2023 0729 by Firman Alpha, RN Outcome: Progressing 11/20/2023 0728 by Firman Alpha, RN Outcome: Progressing   Problem: Safety: Goal: Ability to remain free from injury will improve 11/20/2023 0730 by Firman Alpha, RN Outcome: Progressing 11/20/2023 0729 by Firman Alpha, RN Outcome: Progressing 11/20/2023 0729 by Firman Alpha, RN Outcome: Progressing 11/20/2023 0728 by Firman Alpha, RN Outcome: Progressing   Problem: Skin Integrity: Goal: Risk for impaired skin integrity will decrease 11/20/2023 0730 by Firman Alpha, RN Outcome: Progressing 11/20/2023 0729 by Firman Alpha, RN Outcome: Progressing 11/20/2023 0729 by Firman Alpha, RN Outcome: Progressing 11/20/2023 0728 by Firman Alpha, RN Outcome: Progressing

## 2023-11-20 NOTE — Progress Notes (Signed)
 Patient ID: Joan Wood, female   DOB: 1970-03-17, 53 y.o.   MRN: [id number]    Progress Note from the Palliative Medicine Team at [facility]   Patient Name: Joan Wood        Date: 11/20/2023 DOB: 1970/10/07  Age: 53 y.o. MRN#: [id number] Attending Physician: Drusilla Sabas RAMAN, MD Primary Care Physician: Edman Meade PEDLAR, FNP Admit Date: 10/03/2023   Reason for Consultation/Follow-up   Establishing Goals of Care   HPI/ Brief Hospital Review  53 y.o. female  with past medical history of decompensated liver cirrhosis, PUD, gastric ulcer perforation status post ex lap, GERD, pancreatitis, obstructive sleep apnea.  Patient presented secondary to nausea, coffee-ground emesis, bloody diarrhea with concern for upper GI bleed.  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  Hospitalization complicated by hepatic encephalopathy treated with lactulose and rifaximin.  Patient also requiring recurrent paracenteses for her recurrent ascites.  Discharge complicated by safe discharge concerns, basically patient is homeless.  Today is day 48 of this hospitalization, patient has stabilized and is medically appropriate for discharge.  TOC working hard to help patient find placement  Patient faces ongoing treatment option decisions, advanced directive decisions and anticipatory care needs.   Subjective  Extensive chart review has been completed prior to meeting with patient/family  including labs, vital signs, imaging, progress/consult notes, orders, medications and available advance directive documents.    This NP assessed patient at the bedside as a follow up for Palliative medicine needs and emotional support. Patient is pleasant and engages easily in conversation with me.  Ongoing education offered on the complexity of her current medical situation  specifically as it relates to associated long-term poor prognosis, and likely increasing personal care needs. Education offered on the importance of compliance with medical recommendations, diet, fluid intake and mobility and overall health and wellness.  Again a   discussion was had today regarding advanced directives.  Concepts specific to code status, artifical feeding and hydration, continued IV antibiotics and rehospitalization was had.    Patient continues to verbalize her openness to all offered and available medical interventions to prolong life, she remains hopeful for prolonged quality of life  Again today we spoke to the importance of continued conversation with family and the  medical providers regarding overall plan of care and treatment options,  ensuring decisions are within the context of the patients values and GOCs.  Patient is in agreement with working with spiritual care to document Joan Wood, she names her daughter Joan Wood as #1 management consultant.  I contacted spiritual care and they will make every effort to secure H POA paperwork today  Patient's children ultimately hope that patient can discharge closer to where they live in the [place] area whether that be a SNF for rehabilitation , long-term care,  assisted living or rehab.  They cannot care for her in their homes.   Questions and concerns addressed     In discussion with treatment team, PMT will sign off at this time please reconsult us  in the future if we can be of any assistance..  Time: 50  minutes  Detailed review of medical records ( labs, imaging, vital signs), medically appropriate exam ( MS, skin, cardiac,  resp)   discussed with treatment team, counseling and education to patient, family, staff, documenting clinical information, medication management, coordination of care    Ronal Plants NP  Palliative Medicine Team Team Phone # [phone] Pager [phone]

## 2023-11-20 NOTE — Plan of Care (Signed)

## 2023-11-20 NOTE — Progress Notes (Signed)
 [facility]  PROGRESS NOTE  Joan Wood FMW:[id number] DOB: Nov 27, 1970 DOA: 10/03/2023 PCP: Edman Meade PEDLAR, FNP   Brief HPI:   53 y.o. female with a history of decompensated liver cirrhosis, PUD, gastric ulcer perforation status post ex lap, GERD, pancreatitis, obstructive sleep apnea. Patient presented secondary to nausea, coffee-ground emesis, bloody diarrhea with concern for upper GI bleed. 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 Hospitalization complicated by hepatic encephalopathy treated with lactulose and rifaximin. Patient also requiring recurrent paracenteses for her recurrent ascites.       Assessment/Plan:   Decompensated alcoholic cirrhosis with ascites Patient treated empirically with Ceftriaxone  for possible SBP, although ascites fluid cell count not consistent with diagnosis. P Palliative care consulted for ongoing goals of care discussions. -Continue to treat hepatic encephalopathy -s/p repeat therapeutic paracentesis 11/3 yielding 750cc ascites. Fluid analysis not suggestive of SBP - She has significant abdominal distention, paracentesis was ordered by IR on 11/19/2023 -Insufficient peritoneal fluid noted on abdominal ultrasound.  Paracentesis was not performed   Hepatic encephalopathy -Resolved Improved with lactulose and Rifaximin. Patient's insurance will not cover Rifaximin on discharge. -Mentation now much better with lactulose to 20g TID and resumption of rifiaximin    UTI - Urine culture obtained, growing gram-negative rods - Follow urine culture results - Will start IV Rocephin     Hypoalbuminemia Secondary to underlying liver disease.   Thrombocytopenia Secondary to underlying liver disease.    AKI Presumed secondary to ATN from contrast. Nephrology was consulted for management. Renal  function improved. Patient is not an HD candidate.   Appendicitis General surgery consulted. Patient high-risk for surgical management with recommendation for conservative management. Patient completed antibiotic regimen.   Chronic hypotension Worse this morning. Associated drop in hemoglobin, which may be etiology. Patient has received albumin  IV intermittently. -Continue midodrine  15 mg TID -She was started on octreotide  100 mg every 8 hours as per nephrology on 11/02/2023; I will taper off acute hide over next 3 days   Chronic hyponatremia Patient's baseline appears to be around 125 -128.   - Nephrology was consulted -Sodium continues to be at 126   Acute upper GI bleed Acute esophageal necrosis Acute blood loss anemia Patient seen and evaluated by gastroenterology. Upper GI endoscopy performed on 9/25 with evidence of grade D esophagitis in addition to concern for acute esophageal necrosis. Non-bleeding gastric ulcer noted with sutures. For esophageal necrosis, GI recommended carafate  for two weeks in addition to Protonix  BID and outpatient GI follow-up. Patient with recurrent anemia on 10/24 without obvious hemorrhaging noted and again on 11/1 -Total received 4 units PRBC's this admit      Lower extremity edema VTE ruled out. Likely related to hypoalbuminemia from liver disease.    Mild hyperkalemia -Potassium is 5.6, likely in setting of replacement  for hyperkalemia - Will check serum potassium today   Tobacco abuse -Continue nicotine  patch   Alcohol abuse Noted.   Severe malnutrition -Dietitian recommendations (10/21): Continue current diet as ordered Encourage PO intake Continue ordering assistance Continue Ensure Plus High Protein po TID, each supplement provides 350 kcal and 20 grams of protein. 1 packet Juven BID, each packet provides 95 calories, 2.5 grams of protein (collagen),  to support wound healing Continue MVI with minerals daily, thiamine  and folic  acid for alcohol use history   Goals of care Goals noted for rehab at SNF. TOC consulted and following  Pressure injury Mid sacrum. Unclear if present on admission.          DVT prophylaxis: SCDs  Medications     sodium chloride    Intravenous Once   calcium  carbonate  1 tablet Oral TID WC   feeding supplement  237 mL Oral TID BM   folic acid   1 mg Oral Daily   furosemide  40 mg Oral BID   gabapentin   300 mg Oral TID   lactulose  20 g Oral TID   lidocaine   2 patch Transdermal Q24H   lidocaine  (PF)  10 mL Other Once   midodrine   20 mg Oral TID WC   multivitamin with minerals  1 tablet Oral Daily   nutrition supplement (JUVEN)  1 packet Oral BID BM   octreotide   100 mcg Subcutaneous Q12H   Followed by   NOREEN ON 11/21/2023] octreotide   50 mcg Subcutaneous Q12H   pantoprazole   40 mg Oral BID   rifaximin  550 mg Oral BID   thiamine   100 mg Oral Daily     Data Reviewed:   CBG:  No results for input(s): GLUCAP in the last 168 hours.  SpO2: 100 %    Vitals:   11/20/23 0837 11/20/23 0840 11/20/23 1124 11/20/23 1526  BP: 96/71 104/73 111/75 92/63  Pulse: 98 97 93 94  Resp: 18 18 18 18   Temp: 98.6 F (37 C) 98.1 F (36.7 C) 97.8 F (36.6 C) 99.1 F (37.3 C)  TempSrc: Oral Oral Oral Oral  SpO2: 100% 100% 100% 100%  Weight:      Height:          Data Reviewed:  Basic Metabolic Panel: Recent Labs  Lab 11/14/23 0140 11/18/23 0134 11/19/23 0301  NA 125* 125* 126*  K 4.3 2.8* 5.6*  CL 89* 85* 91*  CO2 27 28 26   GLUCOSE 103* 106* 88  BUN 22* 23* 30*  CREATININE 0.50 0.56 0.69  CALCIUM  8.0* 8.4* 8.5*    CBC: Recent Labs  Lab 11/14/23 0140  WBC 7.0  HGB 9.1*  HCT 25.7*  MCV 95.2  PLT 130*    LFT Recent Labs  Lab 11/14/23 0140 11/18/23 0134  AST 71* 73*  ALT 23 25  ALKPHOS 51 60  BILITOT 2.5* 2.3*  PROT 6.2* 6.8  ALBUMIN  2.3* 2.5*     Antibiotics: Anti-infectives (From admission, onward)    Start     Dose/Rate Route  Frequency Ordered Stop   11/20/23 1400  cefTRIAXone  (ROCEPHIN ) 1 g in sodium chloride  0.9 % 100 mL IVPB        1 g 200 mL/hr over 30 Minutes Intravenous Every 24 hours 11/20/23 1245     11/12/23 1145  rifaximin (XIFAXAN) tablet 550 mg        550 mg Oral 2 times daily 11/12/23 1058     10/29/23 0000  rifaximin (XIFAXAN) 550 MG TABS tablet        550 mg Oral 2 times daily 10/29/23 1129     10/25/23 1415  rifaximin (XIFAXAN) tablet 550 mg  Status:  Discontinued        550 mg Oral 2 times daily 10/25/23 1315 11/10/23 1244   10/12/23 1000  metroNIDAZOLE  (FLAGYL ) tablet 500 mg        500 mg Oral Every 12 hours 10/12/23 0734 10/22/23 2125   10/12/23 0830  cefTRIAXone  (ROCEPHIN ) 2 g in sodium chloride  0.9 % 100 mL IVPB        2  g 200 mL/hr over 30 Minutes Intravenous Every 24 hours 10/12/23 0734 10/22/23 1636   10/07/23 1000  fluconazole  (DIFLUCAN ) tablet 100 mg  Status:  Discontinued        100 mg Oral Daily 10/07/23 0853 10/07/23 0855   10/07/23 1000  fluconazole  (DIFLUCAN ) tablet 150 mg        150 mg Oral Daily 10/07/23 0855 10/07/23 1014   10/04/23 1000  cefTRIAXone  (ROCEPHIN ) 2 g in sodium chloride  0.9 % 100 mL IVPB        2 g 200 mL/hr over 30 Minutes Intravenous Every 24 hours 10/03/23 2154 10/08/23 0854   10/03/23 2015  cefTRIAXone  (ROCEPHIN ) 1 g in sodium chloride  0.9 % 100 mL IVPB        1 g 200 mL/hr over 30 Minutes Intravenous  Once 10/03/23 2010 10/03/23 2212        CONSULTS gastroenterology, general surgery, palliative care  Code Status: Full code  Family Communication: No family present at bedside     Subjective   IR was consulted for paracentesis however patient had only trace amount of blood on the fluid.  Paracentesis was not performed.  Complained of dysuria.  Urine culture growing gram-negative rods   Objective    Physical Examination:  Appears in no acute distress Lungs clear to auscultation bilaterally Abdomen is soft, nontender, no  organomegaly Extremities 1+ edema bilaterally  Status is: Inpatient:      Wound 10/15/23 0900 Pressure Injury Sacrum Mid Stage 2 -  Partial thickness loss of dermis presenting as a shallow open injury with a red, pink wound bed without slough. (Active)        Sabas GORMAN Brod   [facility] If 7PM-7AM, please contact night-coverage at [email or web address], Office  [phone]   11/20/2023, 4:14 PM  LOS: 48 days

## 2023-11-20 NOTE — Progress Notes (Signed)
 Nutrition Follow-up  DOCUMENTATION CODES:   Severe malnutrition in context of chronic illness  INTERVENTION:  Will sign off on RD services. Please re-consult if needed. Continue 2 g sodium diet with 1.2 L fluid restriction. Continue Magic Cup berry and choc BID. Each supplement provides 290 Kcals and 9 grams of protein. Continue Ensure+HP TID. Each supplement provides 350 kcal and 20 grams of protein. Continue daily multivitamin PO, 100 mg thiamine  PO daily, and 1 mg folic acid  PO daily. Continue Juven BID for wound healing.   NUTRITION DIAGNOSIS:   Severe Malnutrition related to chronic illness (cirrhosis) as evidenced by severe fat depletion, severe muscle depletion - remains applicable but improved   GOAL:   Patient will meet greater than or equal to 90% of their needs - currently being met   MONITOR:   PO intake, Supplement acceptance, Labs, I & O's  REASON FOR ASSESSMENT:   Follow-up for: Consult (low BMI) Assessment of nutrition requirement/status  ASSESSMENT:   Pt with hx of cirrhosis, perforated gastric ulcers s/p expl lap 07/2023, recent GIB, h/o pancreatitis, alcohol use disorder, and GERD presented to [facility] ED with N/V/D and poor PO intake. Workup concerning for GI bleed  9/24 - presented to [facility] ED 9/25 - EGD showed necrotic appearing severe esophagitis in the mid and distal esophagus. Severe portal gastropathy. One 2 cm gastric ulcer and multiple superficial duodenal ulcers with clean bases, transferred to [facility] MICU, paracentesis removed 10/4 - diagnostic paracentesis of bright yellow fluid removed 10/6 - paracentesis 4L removed 10/13 - Paracentesis 3.3L removed    10/29 - Severe hyponatremia persists with 125 Na. The patient is on salt tabs with 1.2 L fluid restriction. Visited the patient with RN at bedside. The patient states she does not have an appetite. She orders food and knows she needs to eat but when she looks at the  food, she does not feel like eating. She is drinking one Ensure+HP daily and tells me she drinks a little bit of diet coke. She likes the choc-flavored Ensure. We discussed Magic Cup and she was quite excited about it and wants them in berry and choc flavors. Encouraged the patient to replace other liquids with ONS and to attempt to eat more solid foods. The patient continues to exhibit possible refeeding syndrome with K and Mg still depleted. Palliative on board with ongoing GOC discussions. Safe discharge complicated by the patient's homelessness.  Update: IR was unable to perform therapeutic paracentesis yesterday due to insufficient fluid. The patient was noted to have worsening hepatic encephalopathy after Rifaximin was stopped 2/2 excessive BMs so medication was resumed with improved mental status. Visited the patient who tells me she is hungry all the time and eating 100% of her meals and drinking 1.5 Ensures a day along with Juven and 2-3 Magic Cups daily. She is still having about 4 liquid stools daily. Explained need for low-sodium diet and fluid restriction.  Scheduled Meds:  sodium chloride    Intravenous Once   calcium  carbonate  1 tablet Oral TID WC   feeding supplement  237 mL Oral TID BM   folic acid   1 mg Oral Daily   furosemide  40 mg Oral BID   gabapentin   300 mg Oral TID   lactulose  20 g Oral TID   lidocaine   2 patch Transdermal Q24H   lidocaine  (PF)  10 mL Other Once   midodrine   20 mg Oral TID WC   multivitamin with minerals  1 tablet Oral Daily   nutrition supplement (JUVEN)  1 packet Oral BID BM   octreotide   100 mcg Subcutaneous Q12H   Followed by   NOREEN ON 11/21/2023] octreotide   50 mcg Subcutaneous Q12H   pantoprazole   40 mg Oral BID   rifaximin  550 mg Oral BID   thiamine   100 mg Oral Daily   Labs:     Latest Ref Rng & Units 11/19/2023    3:01 AM 11/18/2023    1:34 AM 11/14/2023    1:40 AM  CMP  Glucose 70 - 99 mg/dL 88  893  896   BUN 6 - 20 mg/dL 30  23   22    Creatinine 0.44 - 1.00 mg/dL 9.30  9.43  9.49   Sodium 135 - 145 mmol/L 126  125  125   Potassium 3.5 - 5.1 mmol/L 5.6  2.8  4.3   Chloride 98 - 111 mmol/L 91  85  89   CO2 22 - 32 mmol/L 26  28  27    Calcium  8.9 - 10.3 mg/dL 8.5  8.4  8.0   Total Protein 6.5 - 8.1 g/dL  6.8  6.2   Total Bilirubin 0.0 - 1.2 mg/dL  2.3  2.5   Alkaline Phos 38 - 126 U/L  60  51   AST 15 - 41 U/L  73  71   ALT 0 - 44 U/L  25  23      I/O: -11 L since admit  Meal Intake: 85-100%   NUTRITION - FOCUSED PHYSICAL EXAM:  Flowsheet Row Most Recent Value  Orbital Region Severe depletion  Upper Arm Region Severe depletion  Thoracic and Lumbar Region Severe depletion  Buccal Region Severe depletion  Temple Region Severe depletion  Clavicle Bone Region Moderate depletion  Clavicle and Acromion Bone Region Severe depletion  Scapular Bone Region Severe depletion  Dorsal Hand Severe depletion  Patellar Region Unable to assess  Anterior Thigh Region Unable to assess  Posterior Calf Region Severe depletion  Edema (RD Assessment) None  [ascites in abdomen]  Hair Reviewed  Eyes Reviewed  [jaundice]  Mouth Reviewed  Skin Reviewed  [jaundice]  Nails Reviewed  [broken]    Diet Order             Diet 2 gram sodium Room service appropriate? Yes with Assist; Fluid consistency: Thin; Fluid restriction: 1200 mL Fluid  Diet effective now                   EDUCATION NEEDS:   Education needs have been addressed  Skin:  Skin Assessment: Skin Integrity Issues: Skin Integrity Issues:: Stage II Stage II: sacrum  Last BM:  11/9 type 1 on Lactulose  Height:   Ht Readings from Last 1 Encounters:  10/04/23 5' 2 (1.575 m)    Weight:   Edema: +2 BLE, non-pitting BUE, generalized and perineal  Ideal Body Weight:  50 kg  BMI:  Body mass index is 22.62 kg/m.  Estimated Nutritional Needs:   Kcal:  1600-1800  Protein:  85-100  Fluid:  1.2 L fluid restricted noted    Leverne Ruth, MS,  RDN, LDN [place]. [facility] See AMION for contact information

## 2023-11-20 NOTE — Plan of Care (Signed)
  Problem: Health Behavior/Discharge Planning: Goal: Ability to manage health-related needs will improve 11/20/2023 0729 by Firman Alpha, RN Outcome: Progressing 11/20/2023 0729 by Firman Alpha, RN Outcome: Progressing 11/20/2023 0728 by Firman Alpha, RN Outcome: Progressing   Problem: Clinical Measurements: Goal: Ability to maintain clinical measurements within normal limits will improve 11/20/2023 0729 by Firman Alpha, RN Outcome: Progressing 11/20/2023 0729 by Firman Alpha, RN Outcome: Progressing 11/20/2023 0728 by Firman Alpha, RN Outcome: Progressing Goal: Will remain free from infection 11/20/2023 0729 by Firman Alpha, RN Outcome: Progressing 11/20/2023 0729 by Firman Alpha, RN Outcome: Progressing 11/20/2023 0728 by Firman Alpha, RN Outcome: Progressing Goal: Diagnostic test results will improve 11/20/2023 0729 by Firman Alpha, RN Outcome: Progressing 11/20/2023 0729 by Firman Alpha, RN Outcome: Progressing 11/20/2023 0728 by Firman Alpha, RN Outcome: Progressing Goal: Respiratory complications will improve 11/20/2023 0729 by Firman Alpha, RN Outcome: Progressing 11/20/2023 0729 by Firman Alpha, RN Outcome: Progressing 11/20/2023 0728 by Firman Alpha, RN Outcome: Progressing Goal: Cardiovascular complication will be avoided 11/20/2023 0729 by Firman Alpha, RN Outcome: Progressing 11/20/2023 0729 by Firman Alpha, RN Outcome: Progressing 11/20/2023 0728 by Firman Alpha, RN Outcome: Progressing   Problem: Activity: Goal: Risk for activity intolerance will decrease 11/20/2023 0729 by Firman Alpha, RN Outcome: Progressing 11/20/2023 0729 by Firman Alpha, RN Outcome: Progressing 11/20/2023 0728 by Firman Alpha, RN Outcome: Progressing   Problem: Nutrition: Goal: Adequate nutrition will be maintained 11/20/2023 0729 by Firman Alpha, RN Outcome: Progressing 11/20/2023 0729 by Firman Alpha, RN Outcome: Progressing 11/20/2023 0728 by Firman Alpha, RN Outcome: Progressing   Problem: Coping: Goal: Level of anxiety will decrease 11/20/2023 0729 by Firman Alpha, RN Outcome: Progressing 11/20/2023 0729 by Firman Alpha, RN Outcome: Progressing 11/20/2023 0728 by Firman Alpha, RN Outcome: Progressing   Problem: Elimination: Goal: Will not experience complications related to bowel motility 11/20/2023 0729 by Firman Alpha, RN Outcome: Progressing 11/20/2023 0729 by Firman Alpha, RN Outcome: Progressing 11/20/2023 0728 by Firman Alpha, RN Outcome: Progressing Goal: Will not experience complications related to urinary retention 11/20/2023 0729 by Firman Alpha, RN Outcome: Progressing 11/20/2023 0729 by Firman Alpha, RN Outcome: Progressing 11/20/2023 0728 by Firman Alpha, RN Outcome: Progressing   Problem: Pain Managment: Goal: General experience of comfort will improve and/or be controlled 11/20/2023 0729 by Firman Alpha, RN Outcome: Progressing 11/20/2023 0729 by Firman Alpha, RN Outcome: Progressing 11/20/2023 0728 by Firman Alpha, RN Outcome: Progressing   Problem: Safety: Goal: Ability to remain free from injury will improve 11/20/2023 0729 by Firman Alpha, RN Outcome: Progressing 11/20/2023 0729 by Firman Alpha, RN Outcome: Progressing 11/20/2023 0728 by Firman Alpha, RN Outcome: Progressing   Problem: Skin Integrity: Goal: Risk for impaired skin integrity will decrease 11/20/2023 0729 by Firman Alpha, RN Outcome: Progressing 11/20/2023 0729 by Firman Alpha, RN Outcome: Progressing 11/20/2023 0728 by Firman Alpha, RN Outcome: Progressing

## 2023-11-20 NOTE — Plan of Care (Signed)
 Working with comptroller. Frequent getting on edge of bed and alarm being set off. PRN pain request x 1. Visited by a friend today. Ambulated around halls today with therapy.    Problem: Activity: Goal: Risk for activity intolerance will decrease Outcome: Progressing   Problem: Nutrition: Goal: Adequate nutrition will be maintained Outcome: Progressing   Problem: Coping: Goal: Level of anxiety will decrease Outcome: Progressing   Problem: Pain Managment: Goal: General experience of comfort will improve and/or be controlled Outcome: Progressing   Problem: Safety: Goal: Ability to remain free from injury will improve Outcome: Progressing   Problem: Skin Integrity: Goal: Risk for impaired skin integrity will decrease Outcome: Progressing

## 2023-11-20 NOTE — Progress Notes (Addendum)
 Physical Therapy Treatment Patient Details Name: Joan Wood MRN: [id number] DOB: 07-03-1970 Today's Date: 11/20/2023   History of Present Illness 53 y.o. female presents to [facility] on 10/03/2023 with nausea/vomiting and bloody diarrhea. Admitted with decompensated alcoholic cirrhosis w/ ascites and hepatic encephalopathy. 9/25 Upper EGD concerning for possible acute esophageal necrosis, portal hypertension, non-bleeding gastric and duodenal ulcers. Pt also underwent paracentesis on 9/25, 10/6, 10/22. PMH includes decompensated cirrhosis, PUD, GERD, pancreatitis, OSA.    PT Comments  Pt received in sitting on the [facility] and agreeable to session. BLE edema noted with pt reporting R foot pain. Pt able to tolerate increased gait distance without dizziness, but begins to appear shaky with progressed distance. Pt requests to use the bathroom at the end of the session and is able to complete hygiene tasks with up to CGA for safety in standing. Pt demonstrates inability to dual task and becomes distracted easily requiring increased cues and redirection to complete tasks. Pt's BP stable throughout and pt agreeable to sit in recliner at end of session with encouragement. Pt continues to benefit from PT services to progress toward functional mobility goals.     If plan is discharge home, recommend the following: A lot of help with walking and/or transfers;A lot of help with bathing/dressing/bathroom;Assistance with cooking/housework;Assist for transportation;Help with stairs or ramp for entrance;Direct supervision/assist for medications management;Direct supervision/assist for financial management;Supervision due to cognitive status   Can travel by private vehicle     Yes  Equipment Recommendations  Rolling walker (2 wheels);BSC/3in1    Recommendations for Other Services       Precautions / Restrictions Precautions Precautions: Fall Recall of Precautions/Restrictions:  Impaired Precaution/Restrictions Comments: orthostatic Restrictions Weight Bearing Restrictions Per Provider Order: Yes     Mobility  Bed Mobility               General bed mobility comments: Pt on BSC upon entry and in recliner at end of session    Transfers Overall transfer level: Needs assistance Equipment used: Rolling walker (2 wheels) Transfers: Sit to/from Stand Sit to Stand: Contact guard assist           General transfer comment: reliance on RW support. Cues for safety due to pt sitting quickly intermittently    Ambulation/Gait Ambulation/Gait assistance: Contact guard assist, Min assist Gait Distance (Feet): 90 Feet Assistive device: Rolling walker (2 wheels) Gait Pattern/deviations: Step-through pattern, Decreased stride length, Drifts right/left Gait velocity: decr     General Gait Details: Pt requires cues for RW proximity and awareness wtih intermittent drifting. Intermittent min A for balance with distractions and turns. chair follow for safety and cues for seated rest due to shakiness noted   Stairs             Wheelchair Mobility     Tilt Bed    Modified Rankin (Stroke Patients Only)       Balance Overall balance assessment: Needs assistance Sitting-balance support: No upper extremity supported, Feet supported Sitting balance-Leahy Scale: Good     Standing balance support: Bilateral upper extremity supported, Reliant on assistive device for balance, During functional activity Standing balance-Leahy Scale: Poor Standing balance comment: reliant on UE support                            Communication Communication Communication: No apparent difficulties  Cognition Arousal: Alert Behavior During Therapy: Impulsive, Lability   PT - Cognitive impairments: Awareness, Problem solving, Safety/Judgement  PT - Cognition Comments: Pt demonstrates impaired awareness and problem solving  requiring cues for safety. Pt intermittently staring straight ahead while attempting to complete mobility task requiring cues for visual scanning and awareness. Following commands: Impaired Following commands impaired: Follows multi-step commands inconsistently    Cueing Cueing Techniques: Verbal cues, Visual cues  Exercises      General Comments General comments (skin integrity, edema, etc.): BP 127/72 at beginning of session and 117/83 sitting in recliner at end of session.      Pertinent Vitals/Pain Pain Assessment Pain Assessment: Faces Faces Pain Scale: Hurts a little bit Pain Location: back, bottom Pain Descriptors / Indicators: Grimacing, Discomfort Pain Intervention(s): Monitored during session, Repositioned     PT Goals (current goals can now be found in the care plan section) Acute Rehab PT Goals Patient Stated Goal: to return to independence PT Goal Formulation: With patient Time For Goal Achievement: 11/29/23 Progress towards PT goals: Progressing toward goals    Frequency    Min 2X/week       AM-PAC PT 6 Clicks Mobility   Outcome Measure  Help needed turning from your back to your side while in a flat bed without using bedrails?: A Little Help needed moving from lying on your back to sitting on the side of a flat bed without using bedrails?: A Little Help needed moving to and from a bed to a chair (including a wheelchair)?: A Little Help needed standing up from a chair using your arms (e.g., wheelchair or bedside chair)?: A Little Help needed to walk in hospital room?: A Little Help needed climbing 3-5 steps with a railing? : A Lot 6 Click Score: 17    End of Session Equipment Utilized During Treatment: Gait belt Activity Tolerance: Patient tolerated treatment well Patient left: in chair;with chair alarm set;with call bell/phone within reach Nurse Communication: Mobility status PT Visit Diagnosis: Other abnormalities of gait and mobility  (R26.89);Muscle weakness (generalized) (M62.81);Unsteadiness on feet (R26.81);Difficulty in walking, not elsewhere classified (R26.2)     Time: 8974-8942 PT Time Calculation (min) (ACUTE ONLY): 32 min  Charges:    $Gait Training: 8-22 mins $Therapeutic Activity: 8-22 mins PT General Charges $$ ACUTE PT VISIT: 1 Visit                     Darryle George, PTA Acute Rehabilitation Services Secure Chat Preferred  [phone]    Darryle George 11/20/2023, 12:48 PM

## 2023-11-20 NOTE — Plan of Care (Signed)
  Problem: Health Behavior/Discharge Planning: Goal: Ability to manage health-related needs will improve 11/20/2023 0729 by Firman Alpha, RN Outcome: Progressing 11/20/2023 0728 by Firman Alpha, RN Outcome: Progressing   Problem: Clinical Measurements: Goal: Ability to maintain clinical measurements within normal limits will improve 11/20/2023 0729 by Firman Alpha, RN Outcome: Progressing 11/20/2023 0728 by Firman Alpha, RN Outcome: Progressing Goal: Will remain free from infection 11/20/2023 0729 by Firman Alpha, RN Outcome: Progressing 11/20/2023 0728 by Firman Alpha, RN Outcome: Progressing Goal: Diagnostic test results will improve 11/20/2023 0729 by Firman Alpha, RN Outcome: Progressing 11/20/2023 0728 by Firman Alpha, RN Outcome: Progressing Goal: Respiratory complications will improve 11/20/2023 0729 by Firman Alpha, RN Outcome: Progressing 11/20/2023 0728 by Firman Alpha, RN Outcome: Progressing Goal: Cardiovascular complication will be avoided 11/20/2023 0729 by Firman Alpha, RN Outcome: Progressing 11/20/2023 0728 by Firman Alpha, RN Outcome: Progressing   Problem: Activity: Goal: Risk for activity intolerance will decrease 11/20/2023 0729 by Firman Alpha, RN Outcome: Progressing 11/20/2023 0728 by Firman Alpha, RN Outcome: Progressing   Problem: Nutrition: Goal: Adequate nutrition will be maintained 11/20/2023 0729 by Firman Alpha, RN Outcome: Progressing 11/20/2023 0728 by Firman Alpha, RN Outcome: Progressing   Problem: Coping: Goal: Level of anxiety will decrease 11/20/2023 0729 by Firman Alpha, RN Outcome: Progressing 11/20/2023 0728 by Firman Alpha, RN Outcome: Progressing   Problem: Elimination: Goal: Will not experience complications related to bowel motility 11/20/2023 0729 by Firman Alpha, RN Outcome: Progressing 11/20/2023  0728 by Firman Alpha, RN Outcome: Progressing Goal: Will not experience complications related to urinary retention 11/20/2023 0729 by Firman Alpha, RN Outcome: Progressing 11/20/2023 0728 by Firman Alpha, RN Outcome: Progressing   Problem: Pain Managment: Goal: General experience of comfort will improve and/or be controlled 11/20/2023 0729 by Firman Alpha, RN Outcome: Progressing 11/20/2023 0728 by Firman Alpha, RN Outcome: Progressing   Problem: Safety: Goal: Ability to remain free from injury will improve 11/20/2023 0729 by Firman Alpha, RN Outcome: Progressing 11/20/2023 0728 by Firman Alpha, RN Outcome: Progressing   Problem: Skin Integrity: Goal: Risk for impaired skin integrity will decrease 11/20/2023 0729 by Firman Alpha, RN Outcome: Progressing 11/20/2023 0728 by Firman Alpha, RN Outcome: Progressing

## 2023-11-21 DIAGNOSIS — K7290 Hepatic failure, unspecified without coma: Secondary | ICD-10-CM | POA: Diagnosis not present

## 2023-11-21 DIAGNOSIS — K7460 Unspecified cirrhosis of liver: Secondary | ICD-10-CM | POA: Diagnosis not present

## 2023-11-21 LAB — PROTIME-INR
INR: 1.5 — ABNORMAL HIGH (ref 0.8–1.2)
Prothrombin Time: 19.1 s — ABNORMAL HIGH (ref 11.4–15.2)

## 2023-11-21 LAB — URINE CULTURE: Culture: >=100000 — AB

## 2023-11-21 MED ORDER — SULFAMETHOXAZOLE-TRIMETHOPRIM 800-160 MG PO TABS
1.0000 | ORAL_TABLET | Freq: Two times a day (BID) | ORAL | Status: AC
  Administered 2023-11-21 – 2023-11-23 (×6): 1 via ORAL
  Filled 2023-11-21 (×6): qty 1

## 2023-11-21 NOTE — Progress Notes (Signed)
 PROGRESS NOTE  Joan Wood FMW:[id number] DOB: 06-Feb-1970 DOA: 10/03/2023 PCP: Edman Meade PEDLAR, FNP   LOS: 49 days   Brief narrative:  53 y.o. female with a history of decompensated liver cirrhosis, PUD, gastric ulcer perforation status post ex lap, GERD, pancreatitis, obstructive sleep apnea. Patient presented secondary to nausea, coffee-ground emesis, bloody diarrhea with concern for upper GI bleed. 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 Hospitalization complicated by hepatic encephalopathy treated with lactulose and rifaximin. Patient also requiring recurrent paracenteses for her recurrent ascites.    Assessment/Plan: Principal Problem:   Decompensated cirrhosis (HCC) Active Problems:   GI bleed   Liver cirrhosis (HCC)   Esophageal necrosis   Acute appendicitis   Goals of care, counseling/discussion   Alcohol use disorder   Hyponatremia   Tobacco abuse   Nausea vomiting and diarrhea   Acute on chronic blood loss anemia   ABLA (acute blood loss anemia)   AKI (acute kidney injury)   Pressure injury of skin   Protein-calorie malnutrition, severe   Decompensated alcoholic cirrhosis with ascites Underwent therapeutic paracentesis on 11/12/2023 with removal of 750 cc of ascitic fluid.  Ascitic fluid was not consistent with SBP.SABRA  Received IV Rocephin  for UTI.  Being treated for hepatic encephalopathy with lactulose and rifaximin.  Palliative care consulted for ongoing goals of care discussions.   Hepatic encephalopathy Resolved.  Continue lactulose and rifaximin. Patient's insurance will not cover Rifaximin on discharge.  Continue lactulose 20 g 3 times daily.  Has been having adequate bowel movements.   Enterobacter UTI. Currently on IV Rocephin .  Will change to Bactrim DS twice daily for the next 3 days    Hypoalbuminemia Secondary to underlying liver disease.  Check CMP in AM.   Thrombocytopenia Secondary to liver disease.  No latest CBC.  Check CBC in AM   Acute kidney injury Presumed secondary to ATN from contrast. Nephrology was consulted and renal function has improved also.  Latest creatinine of 0.5.   Acute appendicitis General surgery was consulted and patient was thought to be high surgical risk so was treated conservatively with antibiotics.  Has improved at this time    Chronic hypotension Has received albumin  intermittently.  Currently on midodrine  15 3 times daily.  Was started on  octreotide  100 mg every 8 hours as per nephrology on 11/02/2023; and is on tapering dose at this time.   Chronic hyponatremia Patient's baseline appears to be around 125 -128.  Latest sodium of 128. Nephrology was consulted during hospitalization   Acute blood loss anemia secondary to acute upper GI bleed from esophagitis and esophageal necrosis Patient was seen by GI during hospitalization and underwent upper GI endoscopy on 10/04/2023 with findings of grade D esophagitis in addition to concern for acute esophageal necrosis. Non-bleeding gastric ulcer noted GI recommended Carafate  for 2 weeks and Protonix  twice daily with outpatient GI follow-up.  Patient received 4 units of packed RBC during this hospitalization.  Of note patient had recurrent anemia on 11/02/2023 without any obvious bleeding.  Will continue to monitor hemoglobin periodically.  No recent CBC available    Lower extremity edema Secondary to hypoalbuminemia from liver disease.  Duplex ultrasound of the lower extremity negative for DVT.    Mild hyperkalemia Improved.  Latest potassium of 3.6   Tobacco abuse Counseling done   History of alcohol abuse No active issues.  Counseling done.  Continue multivitamin and thiamine    Severe protein malnutrition Nutrition Status: Body mass index is 22.62 kg/m.  Nutrition Problem: Severe  Malnutrition Etiology: chronic illness (cirrhosis) Signs/Symptoms: severe fat depletion, severe muscle depletion Interventions: Ensure Enlive (each supplement provides 350kcal and 20 grams of protein), MVI, Liberalize Diet, Magic cup Continue nutritional supplement.  Continue multivitamin thiamine  and folic acid .   Pressure injury sacrum stage II pressure Wound 10/15/23 0900 Pressure Injury Sacrum Mid Stage 2 -  Partial thickness loss of dermis presenting as a shallow open injury with a red, pink wound bed without slough. (Active)  Unclear if present on admission but continue wound care.   Goals of care Plan for skilled nursing facility for rehabitation.  TOC on board. DVT prophylaxis: SCDs Start: 10/03/23 2153   Disposition: Awaiting for disposition likely to skilled nursing facility.  Status is: Inpatient Remains inpatient appropriate because:  awaiting disposition plan.    Code Status:     Code Status: Full Code  Family Communication: None at bedside  Consultants: GI Interventional radiology Palliative care General surgery Nephrology  Procedures: Paracentesis on 11/19/2023  Anti-infectives:  Bactrim DS  Anti-infectives (From admission, onward)    Start     Dose/Rate Route Frequency Ordered Stop   11/21/23 1115  sulfamethoxazole-trimethoprim (BACTRIM DS) 800-160 MG per tablet 1 tablet        1 tablet Oral Every 12 hours 11/21/23 1024 11/24/23 0959   11/20/23 1400  cefTRIAXone  (ROCEPHIN ) 1 g in sodium chloride  0.9 % 100 mL IVPB  Status:  Discontinued        1 g 200 mL/hr over 30 Minutes Intravenous Every 24 hours 11/20/23 1245 11/21/23 1024   11/12/23 1145  rifaximin (XIFAXAN) tablet 550 mg        550 mg Oral 2 times daily 11/12/23 1058     10/29/23 0000  rifaximin (XIFAXAN) 550 MG TABS tablet        550 mg Oral 2 times daily 10/29/23 1129     10/25/23 1415  rifaximin (XIFAXAN) tablet 550 mg  Status:  Discontinued        550 mg Oral 2 times daily 10/25/23 1315  11/10/23 1244   10/12/23 1000  metroNIDAZOLE  (FLAGYL ) tablet 500 mg        500 mg Oral Every 12 hours 10/12/23 0734 10/22/23 2125   10/12/23 0830  cefTRIAXone  (ROCEPHIN ) 2 g in sodium chloride  0.9 % 100 mL IVPB        2 g 200 mL/hr over 30 Minutes Intravenous Every 24 hours 10/12/23 0734 10/22/23 1636   10/07/23 1000  fluconazole  (DIFLUCAN ) tablet 100 mg  Status:  Discontinued        100 mg Oral Daily 10/07/23 0853 10/07/23 0855   10/07/23 1000  fluconazole  (DIFLUCAN ) tablet 150 mg        150 mg Oral Daily 10/07/23 0855 10/07/23 1014   10/04/23 1000  cefTRIAXone  (ROCEPHIN ) 2 g in sodium chloride  0.9 % 100 mL IVPB        2 g 200 mL/hr over 30 Minutes Intravenous Every 24 hours 10/03/23 2154 10/08/23 0854   10/03/23 2015  cefTRIAXone  (ROCEPHIN ) 1 g in sodium chloride  0.9 % 100 mL IVPB        1 g 200 mL/hr over 30 Minutes Intravenous  Once 10/03/23 2010 10/03/23 2212       Subjective: Today, patient was seen and examined at bedside.  Patient denies any nausea, vomiting, fever, chills or rigor.  Has had bowel movements.  Objective: Vitals:   11/21/23 0358 11/21/23 0841  BP: 101/65 106/74  Pulse: (!) 103 (!) 106  Resp: 17 18  Temp: (!) 97.5 F (36.4 C) 99.7 F (37.6 C)  SpO2: 100% 99%    Intake/Output Summary (Last 24 hours) at 11/21/2023 1024 Last data filed at 11/20/2023 1655 Gross per 24 hour  Intake 774 ml  Output --  Net 774 ml   Filed Weights   11/14/23 0500 11/16/23 0500 11/19/23 0500  Weight: 55 kg 58.6 kg 56.1 kg   Body mass index is 22.62 kg/m.   Physical Exam: GENERAL: Patient is alert awake and oriented. Not in obvious distress. HENT: No scleral pallor or icterus. Pupils equally reactive to light. Oral mucosa is moist NECK: is supple, no gross swelling noted. CHEST: Clear to auscultation. No crackles or wheezes.  CVS: S1 and S2 heard, no murmur. Regular rate and rhythm.  ABDOMEN: Soft, non-tender, mildly distended abdomen bowel sounds are  present. EXTREMITIES: Trace edema CNS: Cranial nerves are intact. No focal motor deficits. SKIN: warm and dry without rashes.  Sacral pressure ulceration  Data Review: I have personally reviewed the following laboratory data and studies,  CBC: No results for input(s): WBC, NEUTROABS, HGB, HCT, MCV, PLT in the last 168 hours. Basic Metabolic Panel: Recent Labs  Lab 11/18/23 0134 11/19/23 0301 11/20/23 1715  NA 125* 126* 128*  K 2.8* 5.6* 3.6  CL 85* 91* 90*  CO2 28 26 25   GLUCOSE 106* 88 112*  BUN 23* 30* 25*  CREATININE 0.56 0.69 0.54  CALCIUM  8.4* 8.5* 8.7*   Liver Function Tests: Recent Labs  Lab 11/18/23 0134  AST 73*  ALT 25  ALKPHOS 60  BILITOT 2.3*  PROT 6.8  ALBUMIN  2.5*   No results for input(s): LIPASE, AMYLASE in the last 168 hours. No results for input(s): AMMONIA in the last 168 hours. Cardiac Enzymes: No results for input(s): CKTOTAL, CKMB, CKMBINDEX, TROPONINI in the last 168 hours. BNP (last 3 results) No results for input(s): BNP in the last 8760 hours.  ProBNP (last 3 results) No results for input(s): PROBNP in the last 8760 hours.  CBG: No results for input(s): GLUCAP in the last 168 hours. Recent Results (from the past 240 hours)  Gram stain     Status: None   Collection Time: 11/11/23  2:13 PM   Specimen: Peritoneal Washings  Result Value Ref Range Status   Specimen Description PERITONEAL  Final   Special Requests NONE  Final   Gram Stain   Final    WBC PRESENT,BOTH PMN AND MONONUCLEAR NO ORGANISMS SEEN Performed at [facility], [address], [place], [name] [address]    Report Status 11/11/2023 FINAL  Final  Culture, body fluid w Gram Stain-bottle     Status: None   Collection Time: 11/11/23  2:13 PM   Specimen: Peritoneal Washings  Result Value Ref Range Status   Specimen Description PERITONEAL  Final   Special Requests NONE  Final   Culture   Final    NO GROWTH 5 DAYS Performed at  [facility], [address], [place], [name] [address]    Report Status 11/16/2023 FINAL  Final  Urine Culture (for pregnant, neutropenic or urologic patients or patients with an indwelling urinary catheter)     Status: Abnormal   Collection Time: 11/19/23 10:38 AM   Specimen: Urine, Random  Result Value Ref Range Status   Specimen Description URINE, RANDOM  Final  Special Requests   Final    NONE Performed at [facility], [address], [place], [name] [address]    Culture >=100,000 COLONIES/mL ENTEROBACTER CLOACAE (A)  Final   Report Status 11/21/2023 FINAL  Final   Organism ID, Bacteria ENTEROBACTER CLOACAE (A)  Final      Susceptibility   Enterobacter cloacae - MIC*    CEFEPIME <=0.12 SENSITIVE Sensitive     ERTAPENEM <=0.12 SENSITIVE Sensitive     CIPROFLOXACIN  <=0.06 SENSITIVE Sensitive     GENTAMICIN <=1 SENSITIVE Sensitive     NITROFURANTOIN 64 INTERMEDIATE Intermediate     TRIMETH/SULFA <=20 SENSITIVE Sensitive     PIP/TAZO Value in next row Sensitive      <=4 SENSITIVEThis is a modified FDA-approved test that has been validated and its performance characteristics determined by the reporting laboratory.  This laboratory is certified under the Clinical Laboratory Improvement Amendments CLIA as qualified to perform high complexity clinical laboratory testing.    MEROPENEM Value in next row Sensitive      <=4 SENSITIVEThis is a modified FDA-approved test that has been validated and its performance characteristics determined by the reporting laboratory.  This laboratory is certified under the Clinical Laboratory Improvement Amendments CLIA as qualified to perform high complexity clinical laboratory testing.    * >=100,000 COLONIES/mL ENTEROBACTER CLOACAE     Studies: IR ABDOMEN US  LIMITED Result Date: 11/19/2023 INDICATION: 53 year old female history of decompensated cirrhosis with recurrent ascites. Patient endorsing abdominal distension. Request is for  therapeutic paracentesis EXAM: ULTRASOUND ABDOMEN LIMITED FINDINGS: Imaging of all 4 quadrants of the abdomen reveal no ascites IMPRESSION: Trace ascites seen in ultrasound.  No need for paracentesis. Read by: Delon Beagle, NP Electronically Signed   By: Cordella Banner   On: 11/19/2023 17:08      Vernal Alstrom, MD  [facility] 11/21/2023  If 7PM-7AM, please contact night-coverage

## 2023-11-21 NOTE — Progress Notes (Signed)
 Occupational Therapy Treatment Patient Details Name: Joan Wood MRN: [id number] DOB: December 10, 1970 Today's Date: 11/21/2023   History of present illness 53 y.o. female presents to [facility] hospital on 10/03/2023 with nausea/vomiting and bloody diarrhea. Admitted with decompensated alcoholic cirrhosis w/ ascites and hepatic encephalopathy. 9/25 Upper EGD concerning for possible acute esophageal necrosis, portal hypertension, non-bleeding gastric and duodenal ulcers. Pt also underwent paracentesis on 9/25, 10/6, 10/22. PMH includes decompensated cirrhosis, PUD, GERD, pancreatitis, OSA.   OT comments  Pt seen today for goal update. Pt received ambulating in hallway without staff present, one hospital sock on, one ACE wrap to other leg/foot. Pt safely returned to room with OT, requiring CGA for mobility via RW. Pt completed standing grooming and UB dressing with CGA. Benefits from CGA with incr effort/time to transfer on/off toilet with heavy reliance on using grab bars. She remains very tangential, often forgets what she is doing mid-task, requiring frequent cues to redirect and maintain attention. Left upright in chair with all needs in reach. OT to continue to follow.      If plan is discharge home, recommend the following:  A little help with walking and/or transfers;A little help with bathing/dressing/bathroom;Direct supervision/assist for medications management;Direct supervision/assist for financial management;Assist for transportation;Supervision due to cognitive status   Equipment Recommendations  BSC/3in1    Recommendations for Other Services      Precautions / Restrictions Precautions Precautions: Fall Recall of Precautions/Restrictions: Impaired Restrictions Weight Bearing Restrictions Per Provider Order: No       Mobility Bed Mobility               General bed mobility comments: not assessed - pt received and left OOB    Transfers Overall transfer level: Needs  assistance Equipment used: Rolling walker (2 wheels) Transfers: Sit to/from Stand, Bed to chair/wheelchair/BSC Sit to Stand: Contact guard assist     Step pivot transfers: Contact guard assist     General transfer comment: reliant on RW, pt often abandoning walker to walk towards door, needs A to safely use AD and for general safety/falls prevention     Balance Overall balance assessment: Needs assistance Sitting-balance support: No upper extremity supported, Feet supported Sitting balance-Leahy Scale: Good Sitting balance - Comments: seated unsupported in chair   Standing balance support: Bilateral upper extremity supported, Reliant on assistive device for balance, During functional activity Standing balance-Leahy Scale: Fair Standing balance comment: CGA with RW, intermittently ambulating short distance in room without AD (reaches for walls/furniture for stability) 2/2 pt abandoning RW                           ADL either performed or assessed with clinical judgement   ADL Overall ADL's : Needs assistance/impaired     Grooming: Supervision/safety;Standing;Wash/dry face;Oral care Grooming Details (indicate cue type and reason): cognitive cues to remain on task, supervision for safety                 Toilet Transfer: Contact guard assist;Ambulation;Regular Toilet;Rolling walker (2 wheels);Grab bars Toilet Transfer Details (indicate cue type and reason): heavily reliant on using grab bars to assist & control descent         Functional mobility during ADLs: Contact guard assist;Rolling walker (2 wheels)      Extremity/Trunk Assessment              Vision       Perception     Praxis     Communication Communication  Communication: No apparent difficulties Factors Affecting Communication:  (verbose)   Cognition Arousal: Alert Behavior During Therapy: Impulsive, Lability Cognition: Cognition impaired   Orientation impairments: Situation,  Time Awareness: Intellectual awareness impaired Memory impairment (select all impairments): Short-term memory, Working memory Attention impairment (select first level of impairment): Sustained attention Executive functioning impairment (select all impairments): Organization, Reasoning, Problem solving, Initiation OT - Cognition Comments: pt becoming distracted by her own thoughts mid-task, often requiring frequent redirection, very tangential                 Following commands: Impaired Following commands impaired: Follows multi-step commands inconsistently      Cueing   Cueing Techniques: Verbal cues, Visual cues  Exercises      Shoulder Instructions       General Comments pt extremely tangential, overall participatory    Pertinent Vitals/ Pain       Pain Assessment Pain Assessment: Faces Faces Pain Scale: Hurts a little bit Pain Location: generalized Pain Descriptors / Indicators: Grimacing, Discomfort Pain Intervention(s): Monitored during session, Repositioned  Home Living                                          Prior Functioning/Environment              Frequency  Min 2X/week        Progress Toward Goals  OT Goals(current goals can now be found in the care plan section)  Progress towards OT goals: Progressing toward goals  Acute Rehab OT Goals Time For Goal Achievement: 12/05/23 ADL Goals Pt Will Perform Grooming: with supervision;standing Pt Will Transfer to Toilet: with supervision;ambulating;regular height toilet;grab bars Pt Will Perform Toileting - Clothing Manipulation and hygiene: with supervision;sitting/lateral leans;sit to/from stand  Plan      Co-evaluation                 AM-PAC OT 6 Clicks Daily Activity     Outcome Measure   Help from another person eating meals?: None Help from another person taking care of personal grooming?: A Little Help from another person toileting, which includes using  toliet, bedpan, or urinal?: A Little Help from another person bathing (including washing, rinsing, drying)?: A Lot Help from another person to put on and taking off regular upper body clothing?: A Little Help from another person to put on and taking off regular lower body clothing?: A Little 6 Click Score: 18    End of Session Equipment Utilized During Treatment: Rolling walker (2 wheels)  OT Visit Diagnosis: Other abnormalities of gait and mobility (R26.89);Muscle weakness (generalized) (M62.81);Pain;Other symptoms and signs involving cognitive function   Activity Tolerance Patient tolerated treatment well   Patient Left in chair;with call bell/phone within reach;with chair alarm set   Nurse Communication Mobility status        Time: 9086-9065 OT Time Calculation (min): 21 min  Charges: OT General Charges $OT Visit: 1 Visit OT Treatments $Self Care/Home Management : 8-22 mins  Curties Conigliaro M. Bur[place], OTR/L [facility] Acute Rehabilitation Services [phone] Secure Chat Preferred  Rikki Burma 11/21/2023, 12:30 PM

## 2023-11-21 NOTE — TOC Progression Note (Signed)
 Transition of Care [facility]) - Progression Note    Patient Details  Name: KAYTLAN BEHRMAN MRN: [id number] Date of Birth: 10/26/1970  Transition of Care [facility]) CM/SW Contact  [name], [name] Phone Number: 11/21/2023, 4:36 PM  Clinical Narrative:   CSW received a call back from [facility] with [facility] and Rehab that they are out of network with patient's insurance. No other calls back received. CSW to follow.    Expected Discharge Plan: Skilled Nursing Facility Barriers to Discharge: Homeless with medical needs, Inadequate or no insurance, Continued Medical Work up, Unsafe home situation               Expected Discharge Plan and Services In-house Referral: Clinical Social Work Discharge Planning Services: CM Consult Post Acute Care Choice: Home Health Living arrangements for the past 2 months: Single Family Home                 DME Arranged: Walker rolling   Date DME Agency Contacted: 10/08/23   Representative spoke with at DME Agency: [name]             Social Drivers of Health (SDOH) Interventions SDOH Screenings   Food Insecurity: No Food Insecurity (10/03/2023)  Housing: Low Risk  (10/03/2023)  Transportation Needs: No Transportation Needs (10/03/2023)  Recent Concern: Transportation Needs - Unmet Transportation Needs (08/20/2023)  Utilities: Not At Risk (10/03/2023)  Depression (PHQ2-9): High Risk (07/30/2023)  Social Connections: Socially Isolated (10/03/2023)  Tobacco Use: High Risk (10/04/2023)    Readmission Risk Interventions    10/04/2023    9:02 AM 09/03/2023   12:31 PM 09/02/2023    9:01 AM  Readmission Risk Prevention Plan  Transportation Screening Complete Complete Complete  PCP or Specialist Appt within 3-5 Days   Complete  HRI or Home Care Consult   Complete  Social Work Consult for Recovery Care Planning/Counseling   Complete  Palliative Care Screening   Not Applicable  Medication Review Oceanographer) Complete Complete Complete  HRI  or Home Care Consult Complete Complete   SW Recovery Care/Counseling Consult Complete Complete   Palliative Care Screening Not Applicable Not Applicable   Skilled Nursing Facility Not Applicable Patient Refused

## 2023-11-22 ENCOUNTER — Inpatient Hospital Stay (HOSPITAL_COMMUNITY)

## 2023-11-22 DIAGNOSIS — M85871 Other specified disorders of bone density and structure, right ankle and foot: Secondary | ICD-10-CM | POA: Diagnosis not present

## 2023-11-22 DIAGNOSIS — K7460 Unspecified cirrhosis of liver: Secondary | ICD-10-CM | POA: Diagnosis not present

## 2023-11-22 DIAGNOSIS — K7290 Hepatic failure, unspecified without coma: Secondary | ICD-10-CM | POA: Diagnosis not present

## 2023-11-22 LAB — COMPREHENSIVE METABOLIC PANEL WITH GFR
ALT: 24 U/L (ref 0–44)
AST: 65 U/L — ABNORMAL HIGH (ref 15–41)
Albumin: 2.7 g/dL — ABNORMAL LOW (ref 3.5–5.0)
Alkaline Phosphatase: 57 U/L (ref 38–126)
Anion gap: 13 (ref 5–15)
BUN: 22 mg/dL — ABNORMAL HIGH (ref 6–20)
CO2: 23 mmol/L (ref 22–32)
Calcium: 8.5 mg/dL — ABNORMAL LOW (ref 8.9–10.3)
Chloride: 89 mmol/L — ABNORMAL LOW (ref 98–111)
Creatinine, Ser: 0.65 mg/dL (ref 0.44–1.00)
GFR, Estimated: >60 mL/min (ref >60)
Glucose, Bld: 105 mg/dL — ABNORMAL HIGH (ref 70–99)
Potassium: 2.7 mmol/L — CL (ref 3.5–5.1)
Sodium: 125 mmol/L — ABNORMAL LOW (ref 135–145)
Total Bilirubin: 2.2 mg/dL — ABNORMAL HIGH (ref 0.0–1.2)
Total Protein: 7.4 g/dL (ref 6.5–8.1)

## 2023-11-22 LAB — PROTIME-INR
INR: 1.6 — ABNORMAL HIGH (ref 0.8–1.2)
Prothrombin Time: 20.3 s — ABNORMAL HIGH (ref 11.4–15.2)

## 2023-11-22 LAB — CBC
HCT: 25.5 % — ABNORMAL LOW (ref 36.0–46.0)
Hemoglobin: 8.9 g/dL — ABNORMAL LOW (ref 12.0–15.0)
MCH: 33.7 pg (ref 26.0–34.0)
MCHC: 34.9 g/dL (ref 30.0–36.0)
MCV: 96.6 fL (ref 80.0–100.0)
Platelets: 119 K/uL — ABNORMAL LOW (ref 150–400)
RBC: 2.64 MIL/uL — ABNORMAL LOW (ref 3.87–5.11)
RDW: 17 % — ABNORMAL HIGH (ref 11.5–15.5)
WBC: 6.8 K/uL (ref 4.0–10.5)
nRBC: 0 % (ref 0.0–0.2)

## 2023-11-22 LAB — URIC ACID: Uric Acid, Serum: 7.4 mg/dL — ABNORMAL HIGH (ref 2.5–7.1)

## 2023-11-22 LAB — MAGNESIUM: Magnesium: 1.4 mg/dL — ABNORMAL LOW (ref 1.7–2.4)

## 2023-11-22 MED ORDER — POTASSIUM CHLORIDE CRYS ER 20 MEQ PO TBCR
40.0000 meq | EXTENDED_RELEASE_TABLET | Freq: Once | ORAL | Status: AC
  Administered 2023-11-22: 40 meq via ORAL
  Filled 2023-11-22: qty 2

## 2023-11-22 MED ORDER — MAGNESIUM SULFATE 4 GM/100ML IV SOLN
4.0000 g | Freq: Once | INTRAVENOUS | Status: AC
  Administered 2023-11-22: 4 g via INTRAVENOUS
  Filled 2023-11-22: qty 100

## 2023-11-22 MED ORDER — COLCHICINE 0.6 MG PO TABS
0.6000 mg | ORAL_TABLET | Freq: Once | ORAL | Status: AC
  Administered 2023-11-22: 0.6 mg via ORAL
  Filled 2023-11-22: qty 1

## 2023-11-22 MED ORDER — GABAPENTIN 400 MG PO CAPS
400.0000 mg | ORAL_CAPSULE | Freq: Three times a day (TID) | ORAL | Status: DC
  Administered 2023-11-22 – 2023-11-24 (×6): 400 mg via ORAL
  Filled 2023-11-22 (×6): qty 1

## 2023-11-22 MED ORDER — POTASSIUM CHLORIDE 10 MEQ/100ML IV SOLN
10.0000 meq | INTRAVENOUS | Status: AC
  Administered 2023-11-22 (×4): 10 meq via INTRAVENOUS
  Filled 2023-11-22 (×4): qty 100

## 2023-11-22 MED ORDER — LACTULOSE 10 GM/15ML PO SOLN
20.0000 g | Freq: Two times a day (BID) | ORAL | Status: DC
  Administered 2023-11-23 – 2023-11-26 (×5): 20 g via ORAL
  Filled 2023-11-22 (×6): qty 30

## 2023-11-22 NOTE — Progress Notes (Addendum)
 PROGRESS NOTE  Joan Wood FMW:[id number] DOB: 01-Nov-1970 DOA: 10/03/2023 PCP: Edman Meade PEDLAR, FNP   LOS: 50 days   Brief narrative:  53 y.o. female with a history of decompensated liver cirrhosis, PUD, gastric ulcer perforation status post ex lap, GERD, pancreatitis, obstructive sleep apnea. Patient presented secondary to nausea, coffee-ground emesis, bloody diarrhea with concern for upper GI bleed. 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 Hospitalization complicated by hepatic encephalopathy treated with lactulose and rifaximin. Patient also requiring recurrent paracenteses for her recurrent ascites.    Assessment/Plan: Principal Problem:   Decompensated cirrhosis (HCC) Active Problems:   GI bleed   Liver cirrhosis (HCC)   Esophageal necrosis   Acute appendicitis   Goals of care, counseling/discussion   Alcohol use disorder   Hyponatremia   Tobacco abuse   Nausea vomiting and diarrhea   Acute on chronic blood loss anemia   ABLA (acute blood loss anemia)   AKI (acute kidney injury)   Pressure injury of skin   Protein-calorie malnutrition, severe   Decompensated alcoholic cirrhosis with ascites Underwent therapeutic paracentesis on 11/12/2023 with removal of 750 cc of ascitic fluid.  Ascitic fluid was not consistent with SBP.  Received IV Rocephin  for UTI.  Being treated for hepatic encephalopathy with lactulose and rifaximin.  Palliative care consulted for ongoing goals of care discussions.   Hepatic encephalopathy Resolved.  Continue lactulose and rifaximin. Patient's insurance will not cover Rifaximin on discharge.  Continue lactulose 20 g 3 times daily.  Has been having adequate bowel movements up to 4-5 times a day.  Will decrease lactulose to 20 g twice daily.   Enterobacter UTI. Currently on IV Rocephin .  Will  change to Bactrim DS twice daily  Hypokalemia Potassium level of 2.7 today.  Magnesium  1.4.  Will replenish with IV 40 mill equivalents of potassium plus oral 40 and 2 g of IV magnesium  sulfate.  Will cut down on lactulose doses to decrease diarrhea  Hypomagnesemia.  Magnesium  1.4.  Will replace with 2 g of IV magnesium  sulfate.  Hypoalbuminemia Secondary to underlying liver disease.  Latest albumin  of 2.7.   Thrombocytopenia Secondary to liver disease.  CBC showed hemoglobin of 8.9.  Platelet 119.   Acute kidney injury Presumed secondary to ATN from contrast. Nephrology was consulted and renal function has improved also.  Latest creatinine of 0.7   Acute appendicitis General surgery was consulted and patient was thought to be high surgical risk so was treated conservatively with antibiotics.  Has improved at this time    Chronic hypotension Has received albumin  intermittently.  Currently on midodrine  15 milligram 3 times daily.  Was started on  octreotide  100 mg every 8 hours as per nephrology on 11/02/2023; and has been tapered off at this time   Chronic hyponatremia Patient's baseline appears to be around 125 -128.  Latest sodium of 125. Nephrology was consulted during hospitalization.   Acute blood loss anemia secondary to acute upper GI bleed from esophagitis and esophageal necrosis Patient was seen by GI during hospitalization and underwent upper GI endoscopy on 10/04/2023 with findings of grade D esophagitis in addition to concern for acute esophageal necrosis. Non-bleeding gastric ulcer noted GI recommended Carafate  for 2 weeks and Protonix  twice daily with outpatient GI follow-up.  Patient received 4 units of packed RBC during this hospitalization.  Of note patient had recurrent anemia on 11/02/2023 without any obvious  bleeding.  Hemoglobin today at 8.9.    Lower extremity edema, right ankle tenderness. Secondary to hypoalbuminemia from liver disease.  Duplex ultrasound of the  lower extremity negative for DVT.  Complains of right ankle pain and tenderness.  Will get x-ray of the right ankle.  Has neuropathy as well.  On gabapentin  300 mg 3 times daily.  Will increase to 400 mg 3 times daily for now.  On Lasix 40 mg twice daily.  Will add uric acid levels.  Empirically try colchicine x 1 if uric acid elevated.  Will check x-ray of the ankle as well..   Tobacco abuse Counseling done   History of alcohol abuse No active issues.  Counseling done.  Continue multivitamin and thiamine .  No signs of withdrawal.   Severe protein malnutrition Nutrition Status: Body mass index is 22.62 kg/m.  Nutrition Problem: Severe Malnutrition Etiology: chronic illness (cirrhosis) Signs/Symptoms: severe fat depletion, severe muscle depletion Interventions: Ensure Enlive (each supplement provides 350kcal and 20 grams of protein), MVI, Liberalize Diet, Magic cup Continue nutritional supplement.  Continue multivitamin thiamine  and folic acid .   Pressure injury sacrum stage II pressure Wound 10/15/23 0900 Pressure Injury Sacrum Mid Stage 2 -  Partial thickness loss of dermis presenting as a shallow open injury with a red, pink wound bed without slough. (Active)  Unclear if present on admission but continue wound care.   Goals of care Plan for skilled nursing facility for rehabitation.  TOC on board.  DVT prophylaxis: SCDs Start: 10/03/23 2153   Disposition: Awaiting for disposition likely to skilled nursing facility.  Status is: Inpatient Remains inpatient appropriate because: Pending clinical improvement, significant hypokalemia and hypomagnesemia, right ankle pain, need for skilled nursing facility placement    Code Status:     Code Status: Full Code  Family Communication: Spoke with the patient's son [name] on the phone and updated him about the clinical condition of the patient.   Consultants: GI Interventional radiology Palliative care General  surgery Nephrology  Procedures: Paracentesis on 11/19/2023  Anti-infectives:  Bactrim DS   Subjective: Today, patient was seen and examined at bedside.  Patient complains of pain in the right ankle with edema and tenderness which she feels like severe pain.  Denies any nausea vomiting or abdominal pain and has been having 5-6 episodes of loose bowel movements  Objective: Vitals:   11/22/23 0036 11/22/23 0754  BP: 107/84 121/72  Pulse: 94 100  Resp: 17 18  Temp: 99 F (37.2 C) 97.9 F (36.6 C)  SpO2: 100% 100%    Intake/Output Summary (Last 24 hours) at 11/22/2023 1052 Last data filed at 11/22/2023 0300 Gross per 24 hour  Intake 120 ml  Output --  Net 120 ml   Filed Weights   11/14/23 0500 11/16/23 0500 11/19/23 0500  Weight: 55 kg 58.6 kg 56.1 kg   Body mass index is 22.62 kg/m.   Physical Exam:  GENERAL: Patient is alert awake and oriented. Not in obvious distress. HENT: No scleral pallor or icterus. Pupils equally reactive to light. Oral mucosa is moist NECK: is supple, no gross swelling noted. CHEST: Clear to auscultation. No crackles or wheezes.  CVS: S1 and S2 heard, no murmur. Regular rate and rhythm.  ABDOMEN: Soft, non-tender, mildly distended abdomen bowel sounds are present. EXTREMITIES: Bilateral lower extremity pitting edema ++, right ankle tenderness on palpation. CNS: Cranial nerves are intact. No focal motor deficits. SKIN: warm and dry without rashes.  Sacral pressure ulceration  Data Review: I have  personally reviewed the following laboratory data and studies,  CBC: Recent Labs  Lab 11/22/23 0851  WBC 6.8  HGB 8.9*  HCT 25.5*  MCV 96.6  PLT 119*   Basic Metabolic Panel: Recent Labs  Lab 11/18/23 0134 11/19/23 0301 11/20/23 1715 11/22/23 0851  NA 125* 126* 128* 125*  K 2.8* 5.6* 3.6 2.7*  CL 85* 91* 90* 89*  CO2 28 26 25 23   GLUCOSE 106* 88 112* 105*  BUN 23* 30* 25* 22*  CREATININE 0.56 0.69 0.54 0.65  CALCIUM  8.4* 8.5*  8.7* 8.5*  MG  --   --   --  1.4*   Liver Function Tests: Recent Labs  Lab 11/18/23 0134 11/22/23 0851  AST 73* 65*  ALT 25 24  ALKPHOS 60 57  BILITOT 2.3* 2.2*  PROT 6.8 7.4  ALBUMIN  2.5* 2.7*   No results for input(s): LIPASE, AMYLASE in the last 168 hours. No results for input(s): AMMONIA in the last 168 hours. Cardiac Enzymes: No results for input(s): CKTOTAL, CKMB, CKMBINDEX, TROPONINI in the last 168 hours. BNP (last 3 results) No results for input(s): BNP in the last 8760 hours.  ProBNP (last 3 results) No results for input(s): PROBNP in the last 8760 hours.  CBG: No results for input(s): GLUCAP in the last 168 hours. Recent Results (from the past 240 hours)  Urine Culture (for pregnant, neutropenic or urologic patients or patients with an indwelling urinary catheter)     Status: Abnormal   Collection Time: 11/19/23 10:38 AM   Specimen: Urine, Random  Result Value Ref Range Status   Specimen Description URINE, RANDOM  Final   Special Requests   Final    NONE Performed at [facility], [address] [place], [name] [address]    Culture >=100,000 COLONIES/mL ENTEROBACTER CLOACAE (A)  Final   Report Status 11/21/2023 FINAL  Final   Organism ID, Bacteria ENTEROBACTER CLOACAE (A)  Final      Susceptibility   Enterobacter cloacae - MIC*    CEFEPIME <=0.12 SENSITIVE Sensitive     ERTAPENEM <=0.12 SENSITIVE Sensitive     CIPROFLOXACIN  <=0.06 SENSITIVE Sensitive     GENTAMICIN <=1 SENSITIVE Sensitive     NITROFURANTOIN 64 INTERMEDIATE Intermediate     TRIMETH/SULFA <=20 SENSITIVE Sensitive     PIP/TAZO Value in next row Sensitive      <=4 SENSITIVEThis is a modified FDA-approved test that has been validated and its performance characteristics determined by the reporting laboratory.  This laboratory is certified under the Clinical Laboratory Improvement Amendments CLIA as qualified to perform high complexity clinical laboratory testing.     MEROPENEM Value in next row Sensitive      <=4 SENSITIVEThis is a modified FDA-approved test that has been validated and its performance characteristics determined by the reporting laboratory.  This laboratory is certified under the Clinical Laboratory Improvement Amendments CLIA as qualified to perform high complexity clinical laboratory testing.    * >=100,000 COLONIES/mL ENTEROBACTER CLOACAE     Studies: No results found.     Deitrich Steve, MD  [facility] 11/22/2023  If 7PM-7AM, please contact night-coverage

## 2023-11-22 NOTE — Plan of Care (Signed)
   Problem: Activity: Goal: Risk for activity intolerance will decrease Outcome: Adequate for Discharge

## 2023-11-22 NOTE — Plan of Care (Signed)

## 2023-11-23 DIAGNOSIS — K7290 Hepatic failure, unspecified without coma: Secondary | ICD-10-CM | POA: Diagnosis not present

## 2023-11-23 DIAGNOSIS — K7460 Unspecified cirrhosis of liver: Secondary | ICD-10-CM | POA: Diagnosis not present

## 2023-11-23 LAB — COMPREHENSIVE METABOLIC PANEL WITH GFR
ALT: 22 U/L (ref 0–44)
AST: 64 U/L — ABNORMAL HIGH (ref 15–41)
Albumin: 2.4 g/dL — ABNORMAL LOW (ref 3.5–5.0)
Alkaline Phosphatase: 53 U/L (ref 38–126)
Anion gap: 11 (ref 5–15)
BUN: 14 mg/dL (ref 6–20)
CO2: 24 mmol/L (ref 22–32)
Calcium: 8.2 mg/dL — ABNORMAL LOW (ref 8.9–10.3)
Chloride: 93 mmol/L — ABNORMAL LOW (ref 98–111)
Creatinine, Ser: 0.69 mg/dL (ref 0.44–1.00)
GFR, Estimated: >60 mL/min (ref >60)
Glucose, Bld: 83 mg/dL (ref 70–99)
Potassium: 3 mmol/L — ABNORMAL LOW (ref 3.5–5.1)
Sodium: 128 mmol/L — ABNORMAL LOW (ref 135–145)
Total Bilirubin: 2.2 mg/dL — ABNORMAL HIGH (ref 0.0–1.2)
Total Protein: 7 g/dL (ref 6.5–8.1)

## 2023-11-23 LAB — CBC
HCT: 23 % — ABNORMAL LOW (ref 36.0–46.0)
Hemoglobin: 8.1 g/dL — ABNORMAL LOW (ref 12.0–15.0)
MCH: 33.9 pg (ref 26.0–34.0)
MCHC: 35.2 g/dL (ref 30.0–36.0)
MCV: 96.2 fL (ref 80.0–100.0)
Platelets: 118 K/uL — ABNORMAL LOW (ref 150–400)
RBC: 2.39 MIL/uL — ABNORMAL LOW (ref 3.87–5.11)
RDW: 17.1 % — ABNORMAL HIGH (ref 11.5–15.5)
WBC: 6.6 K/uL (ref 4.0–10.5)
nRBC: 0 % (ref 0.0–0.2)

## 2023-11-23 LAB — MAGNESIUM: Magnesium: 1.5 mg/dL — ABNORMAL LOW (ref 1.7–2.4)

## 2023-11-23 LAB — PHOSPHORUS: Phosphorus: 3.2 mg/dL (ref 2.5–4.6)

## 2023-11-23 MED ORDER — POTASSIUM CHLORIDE 10 MEQ/100ML IV SOLN
10.0000 meq | INTRAVENOUS | Status: AC
  Administered 2023-11-23 (×2): 10 meq via INTRAVENOUS
  Filled 2023-11-23 (×2): qty 100

## 2023-11-23 MED ORDER — POTASSIUM CHLORIDE 10 MEQ/100ML IV SOLN
10.0000 meq | INTRAVENOUS | Status: DC
  Administered 2023-11-23: 10 meq via INTRAVENOUS
  Filled 2023-11-23 (×2): qty 100

## 2023-11-23 MED ORDER — POTASSIUM CHLORIDE 10 MEQ/100ML IV SOLN: 10.0000 meq | INTRAVENOUS | Status: DC

## 2023-11-23 MED ORDER — MAGNESIUM SULFATE 2 GM/50ML IV SOLN
2.0000 g | Freq: Once | INTRAVENOUS | Status: AC
  Administered 2023-11-23: 2 g via INTRAVENOUS
  Filled 2023-11-23: qty 50

## 2023-11-23 MED ORDER — POTASSIUM CHLORIDE CRYS ER 20 MEQ PO TBCR
40.0000 meq | EXTENDED_RELEASE_TABLET | Freq: Once | ORAL | Status: AC
  Administered 2023-11-23: 40 meq via ORAL
  Filled 2023-11-23: qty 2

## 2023-11-23 MED ORDER — COLCHICINE 0.6 MG PO TABS
0.6000 mg | ORAL_TABLET | Freq: Once | ORAL | Status: AC
  Administered 2023-11-23: 0.6 mg via ORAL
  Filled 2023-11-23: qty 1

## 2023-11-23 MED ORDER — BIOTENE DRY MOUTH MT LIQD: 15.0000 mL | OROMUCOSAL | Status: AC | PRN

## 2023-11-23 MED ORDER — POTASSIUM CHLORIDE 10 MEQ/100ML IV SOLN
10.0000 meq | INTRAVENOUS | Status: AC
  Administered 2023-11-23 (×3): 10 meq via INTRAVENOUS
  Filled 2023-11-23 (×2): qty 100

## 2023-11-23 NOTE — Plan of Care (Signed)
   Problem: Activity: Goal: Risk for activity intolerance will decrease Outcome: Progressing   Problem: Pain Managment: Goal: General experience of comfort will improve and/or be controlled Outcome: Progressing   Problem: Safety: Goal: Ability to remain free from injury will improve Outcome: Progressing

## 2023-11-23 NOTE — TOC Progression Note (Signed)
 Transition of Care [facility]) - Progression Note    Patient Details  Name: Joan Wood MRN: [id number] Date of Birth: [birth year]  [facility] CM/SW Contact  [name], [name] Phone Number: 11/23/2023, 4:07 PM  Clinical Narrative:   CSW met with patient throughout the day to discuss option at [name] for SNF. Patient had questions about the patient population and location, and CSW contacted [facility] to find out answers and provide them back to patient. Patient said she's in agreement to get out of the hospital if it's closer to her kids. CSW contacted [facility] to ask them to initiate insurance authorization.  CSW later called into the patient's room per request, and patient was agitated and tearful; had just hung up the phone with someone but wouldn't share with CSW who was on the phone or answer anything about the phone call. Lengthy conversation with patient as she processed grief and anger about not being able to return home, feeling like her decisions and rights are being taken away, and that she doesn't trust anyone to look out for her anymore. CSW attempted to provide reassurance and encouragement to patient, she was not receptive; ultimately asked to be left alone. CSW to follow.    Expected Discharge Plan: Skilled Nursing Facility Barriers to Discharge: Homeless with medical needs, Inadequate or no insurance, Continued Medical Work up, Unsafe home situation               Expected Discharge Plan and Services In-house Referral: Clinical Social Work Discharge Planning Services: CM Consult Post Acute Care Choice: Home Health Living arrangements for the past 2 months: Single Family Home                 DME Arranged: Walker rolling   Date DME Agency Contacted: 10/08/23   Representative spoke with at DME Agency: [name]             Social Drivers of Health (SDOH) Interventions SDOH Screenings   Food Insecurity: No Food Insecurity  (10/03/2023)  Housing: Low Risk  (10/03/2023)  Transportation Needs: No Transportation Needs (10/03/2023)  Recent Concern: Transportation Needs - Unmet Transportation Needs (08/20/2023)  Utilities: Not At Risk (10/03/2023)  Depression (PHQ2-9): High Risk (07/30/2023)  Social Connections: Socially Isolated (10/03/2023)  Tobacco Use: High Risk (10/04/2023)    Readmission Risk Interventions    10/04/2023    9:02 AM 09/03/2023   12:31 PM 09/02/2023    9:01 AM  Readmission Risk Prevention Plan  Transportation Screening Complete Complete Complete  PCP or Specialist Appt within 3-5 Days   Complete  HRI or Home Care Consult   Complete  Social Work Consult for Recovery Care Planning/Counseling   Complete  Palliative Care Screening   Not Applicable  Medication Review Oceanographer) Complete Complete Complete  HRI or Home Care Consult Complete Complete   SW Recovery Care/Counseling Consult Complete Complete   Palliative Care Screening Not Applicable Not Applicable   Skilled Nursing Facility Not Applicable Patient Refused

## 2023-11-23 NOTE — TOC Progression Note (Signed)
 Transition of Care [facility]) - Progression Note    Patient Details  Name: INDIANNA BORAN MRN: [id number] Date of Birth: Apr 25, 1970  Transition of Care [facility]) CM/SW Contact  [name], [name] Phone Number: 11/23/2023, 4:14 PM  Clinical Narrative:   CSW contacted by patient's son, [name], that he was visiting and hopeful to discuss with CSW in the patient's room. CSW met with patient and both children, [name] and Duwaine, at bedside to answer questions and discuss placement. CSW discussed that patient had been agreeable to [name], but family is hopeful to find something in between [place] and [place]; son lives in [place], daughter lives in [place]. Patient is in agreement, would want to be where her children could get to her. Patient also asking about CSW assisting with finding housing for her daughter that the patient could go stay with her, and CSW explained that she doesn't have any resources that way for housing.  CSW met with son, [name], outside the room to answer additional questions. CSW discussed the patient's significant change in mood yesterday, and [name] said that [place] had spoken with the patient about not returning home and must have really upset her. CSW answered Paul's questions about placement, and [name] requested a medical update from MD due to the patient's feet being really swollen. Message sent to MD to contact son with an update. CSW to follow.    Expected Discharge Plan: Skilled Nursing Facility Barriers to Discharge: Homeless with medical needs, Inadequate or no insurance, Continued Medical Work up, Unsafe home situation               Expected Discharge Plan and Services In-house Referral: Clinical Social Work Discharge Planning Services: CM Consult Post Acute Care Choice: Home Health Living arrangements for the past 2 months: Single Family Home                 DME Arranged: Walker rolling   Date DME Agency Contacted: 10/08/23   Representative spoke with  at DME Agency: [name]             Social Drivers of Health (SDOH) Interventions SDOH Screenings   Food Insecurity: No Food Insecurity (10/03/2023)  Housing: Low Risk  (10/03/2023)  Transportation Needs: No Transportation Needs (10/03/2023)  Recent Concern: Transportation Needs - Unmet Transportation Needs (08/20/2023)  Utilities: Not At Risk (10/03/2023)  Depression (PHQ2-9): High Risk (07/30/2023)  Social Connections: Socially Isolated (10/03/2023)  Tobacco Use: High Risk (10/04/2023)    Readmission Risk Interventions    10/04/2023    9:02 AM 09/03/2023   12:31 PM 09/02/2023    9:01 AM  Readmission Risk Prevention Plan  Transportation Screening Complete Complete Complete  PCP or Specialist Appt within 3-5 Days   Complete  HRI or Home Care Consult   Complete  Social Work Consult for Recovery Care Planning/Counseling   Complete  Palliative Care Screening   Not Applicable  Medication Review Oceanographer) Complete Complete Complete  HRI or Home Care Consult Complete Complete   SW Recovery Care/Counseling Consult Complete Complete   Palliative Care Screening Not Applicable Not Applicable   Skilled Nursing Facility Not Applicable Patient Refused

## 2023-11-23 NOTE — Progress Notes (Signed)
 Occupational Therapy Treatment Patient Details Name: Joan Wood MRN: [id number] DOB: 31-Jan-1970 Today's Date: 11/23/2023   History of present illness 53 y.o. female presents to [facility] on 10/03/2023 with nausea/vomiting and bloody diarrhea. Admitted with decompensated alcoholic cirrhosis w/ ascites and hepatic encephalopathy. 9/25 Upper EGD concerning for possible acute esophageal necrosis, portal hypertension, non-bleeding gastric and duodenal ulcers. Pt also underwent paracentesis on 9/25, 10/6, 10/22. PMH includes decompensated cirrhosis, PUD, GERD, pancreatitis, OSA.   OT comments  Pt greeted standing at EOB, fully naked, IV pole around the other side of the bed. Pt initially reluctant to participate 2/2 pain in R UE, then redirected and agreeable. Pt reports recent BM and attempting to perform hygiene, needs max A due to pt persistently requesting assist for hygiene d/t pain in R UE (2/2 PIV running K+). Observed pt using R UE functionally without complaint later on in session. Introduced merchant navy officer trial for LB dressing, overall needing mod A to complete due to +2 edematous BLE and abdominal swelling. Pt remains tangential and emotionally labile today. Left upright in chair, all needs met. Will continue to follow.      If plan is discharge home, recommend the following:  A little help with walking and/or transfers;A little help with bathing/dressing/bathroom;Direct supervision/assist for medications management;Direct supervision/assist for financial management;Assist for transportation;Supervision due to cognitive status   Equipment Recommendations  BSC/3in1    Recommendations for Other Services      Precautions / Restrictions Precautions Precautions: Fall Recall of Precautions/Restrictions: Impaired Precaution/Restrictions Comments: history of orthostasis Restrictions Weight Bearing Restrictions Per Provider Order: No       Mobility Bed Mobility                General bed mobility comments: pt received and left OOB throughout session    Transfers Overall transfer level: Needs assistance   Transfers: Sit to/from Stand Sit to Stand: Contact guard assist, Min assist           General transfer comment: incr assist d/t pain in RUE and edematous BLE     Balance Overall balance assessment: Needs assistance Sitting-balance support: No upper extremity supported, Feet supported Sitting balance-Leahy Scale: Good Sitting balance - Comments: seated EOB   Standing balance support: Bilateral upper extremity supported, Reliant on assistive device for balance, During functional activity Standing balance-Leahy Scale: Fair Standing balance comment: reliant on HHA per pt request                           ADL either performed or assessed with clinical judgement   ADL Overall ADL's : Needs assistance/impaired     Grooming: Contact guard assist;Standing;Wash/dry hands Grooming Details (indicate cue type and reason): incr time to reach for grooming items         Upper Body Dressing : Minimal assistance;Standing Upper Body Dressing Details (indicate cue type and reason): donned gown, assist for PIV mgmt d/t pain at IV site Lower Body Dressing: Moderate assistance;Cueing for sequencing;Cueing for safety;With adaptive equipment;Sitting/lateral leans Lower Body Dressing Details (indicate cue type and reason): attempted to teach use of sock aide, pt with difficulty executing needing incr A to don RLE sock, limited by abdominal swelling and BLE edema     Toileting- Clothing Manipulation and Hygiene: Maximal assistance Toileting - Clothing Manipulation Details (indicate cue type and reason): pt persistent on having assist for posterior peri hygiene d/t pain in R UE, although observed pt using RUE later on in  session without complaint/difficulty     Functional mobility during ADLs: Contact guard assist      Extremity/Trunk Assessment               Vision       Perception     Praxis     Communication Communication Communication: No apparent difficulties Factors Affecting Communication:  (talkative)   Cognition Arousal: Alert Behavior During Therapy: Impulsive, Lability Cognition: Cognition impaired   Orientation impairments: Time Awareness: Intellectual awareness impaired Memory impairment (select all impairments): Short-term memory, Working memory Attention impairment (select first level of impairment): Sustained attention Executive functioning impairment (select all impairments): Organization, Reasoning, Problem solving, Initiation OT - Cognition Comments: extremely tangential & labile, redirects and self-soothes quickly                 Following commands: Impaired Following commands impaired: Follows multi-step commands inconsistently      Cueing   Cueing Techniques: Verbal cues, Visual cues  Exercises      Shoulder Instructions       General Comments edematous BLE (+2 pitting edema distal>proximal), mild discoloration to R foot, pt endorses minimal sensation in B feet    Pertinent Vitals/ Pain       Pain Assessment Pain Assessment: Faces Faces Pain Scale: Hurts even more Pain Location: R arm where PIV was located (had K+ running) Pain Descriptors / Indicators: Grimacing, Discomfort Pain Intervention(s): Limited activity within patient's tolerance, Monitored during session, Repositioned  Home Living                                          Prior Functioning/Environment              Frequency  Min 2X/week        Progress Toward Goals  OT Goals(current goals can now be found in the care plan section)  Progress towards OT goals: Progressing toward goals (slowly)     Plan      Co-evaluation                 AM-PAC OT 6 Clicks Daily Activity     Outcome Measure   Help from another person eating meals?: None Help from another person taking  care of personal grooming?: A Little Help from another person toileting, which includes using toliet, bedpan, or urinal?: A Lot Help from another person bathing (including washing, rinsing, drying)?: A Lot Help from another person to put on and taking off regular upper body clothing?: A Little Help from another person to put on and taking off regular lower body clothing?: A Lot 6 Click Score: 16    End of Session Equipment Utilized During Treatment: Rolling walker (2 wheels)  OT Visit Diagnosis: Other abnormalities of gait and mobility (R26.89);Muscle weakness (generalized) (M62.81);Pain;Other symptoms and signs involving cognitive function   Activity Tolerance Patient tolerated treatment well   Patient Left     Nurse Communication          Time: 8486-8461 OT Time Calculation (min): 25 min  Charges: OT General Charges $OT Visit: 1 Visit OT Treatments $Self Care/Home Management : 23-37 mins  Blair Mesina M. [place], OTR/L [facility] Acute Rehabilitation Services [phone] Secure Chat Preferred  Dmoni Fortson 11/23/2023, 4:46 PM

## 2023-11-23 NOTE — Progress Notes (Signed)
 PROGRESS NOTE  Joan Wood FMW:[id number] DOB: 02-06-1970 DOA: 10/03/2023 PCP: Edman Meade PEDLAR, FNP   LOS: 51 days   Brief narrative:  53 y.o. female with a history of decompensated liver cirrhosis, PUD, gastric ulcer perforation status post ex lap, GERD, pancreatitis, obstructive sleep apnea. Patient presented secondary to nausea, coffee-ground emesis, bloody diarrhea with concern for upper GI bleed. 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 Hospitalization complicated by hepatic encephalopathy treated with lactulose and rifaximin. Patient also requiring recurrent paracenteses for her recurrent ascites.    Assessment/Plan: Principal Problem:   Decompensated cirrhosis (HCC) Active Problems:   GI bleed   Liver cirrhosis (HCC)   Esophageal necrosis   Acute appendicitis   Goals of care, counseling/discussion   Alcohol use disorder   Hyponatremia   Tobacco abuse   Nausea vomiting and diarrhea   Acute on chronic blood loss anemia   ABLA (acute blood loss anemia)   AKI (acute kidney injury)   Pressure injury of skin   Protein-calorie malnutrition, severe   Decompensated alcoholic cirrhosis with ascites Underwent therapeutic paracentesis on 11/12/2023 with removal of 750 cc of ascitic fluid.  Ascitic fluid was not consistent with SBP.  Received IV Rocephin  for UTI.  Being treated for hepatic encephalopathy with lactulose and rifaximin.  Palliative care consulted for ongoing goals of care discussions.  At this time rifaximin has been discontinued at the request of son since it will not be covered at the outpatient facility and would like to see how she does with encephalopathy.   Hepatic encephalopathy Resolved.  Continue lactulose and rifaximin. Patient's insurance will not cover Rifaximin on discharge.    Had been having adequate  bowel movements up to 4-5 times a day so dose was decreased to lactulose to 20 g twice daily.  Will need to titrate to keep the bowel movements 3-4 times a day.  Will observe off rifaximin for now.   Enterobacter UTI. Continue Bactrim DS twice daily to complete the course  Hypokalemia Potassium level of 3.0 from 2.7 today.  Magnesium  1.5 from 1.4.  Will replenish with IV 40 mill equivalents of potassium plus oral 40 milliequivalents and 2 g of IV magnesium  sulfate again today..  Dose of lactulose was decreased yesterday.    Hypomagnesemia.  Magnesium  1.5.  Will replace with 2 g of IV magnesium  sulfate.  Check levels in AM.  Hypoalbuminemia Secondary to underlying liver disease.  Latest albumin  of 2.4.   Thrombocytopenia Secondary to liver disease.  CBC showed hemoglobin of 8.1.  Platelet 119.   Acute kidney injury Presumed secondary to ATN from contrast. Nephrology was consulted and renal function has improved also.  Latest creatinine of 0.6   Acute appendicitis General surgery was consulted and patient was thought to be high surgical risk so was treated conservatively with antibiotics.  Has improved at this time    Chronic hypotension Has received albumin  intermittently.  Currently on midodrine  15 milligram 3 times daily.  Was  on  octreotide  which has been discontinued.  Chronic hyponatremia Patient's baseline appears to be around 125 -128.  Latest sodium of 128. Nephrology was consulted during hospitalization.   Acute blood loss anemia secondary to acute upper GI bleed from esophagitis and esophageal necrosis Patient was seen by GI during hospitalization and underwent upper GI endoscopy on 10/04/2023 with findings of grade D esophagitis in addition to concern for acute  esophageal necrosis. Non-bleeding gastric ulcer noted GI recommended Carafate  for 2 weeks and Protonix  twice daily with outpatient GI follow-up.  Patient received 4 units of packed RBC during this hospitalization.  Of  note patient had recurrent anemia on 11/02/2023 without any obvious bleeding.  Hemoglobin today at 8.1.  Will transfuse if necessary.    Lower extremity edema, right ankle tenderness. Secondary to hypoalbuminemia from liver disease.  Duplex ultrasound of the lower extremity negative for DVT.   x-ray of the right ankle without any bony issues..  Has neuropathy as well.  On gabapentin  300 mg 3 times daily was increased to 400 mg 3 times daily from 11/22/2023.  on Lasix 40 mg twice daily.  Slightly elevated uric acid level.  Received 1 dose of 0.6 mg of colchicine yesterday.  Will repeat 1 more dose today.     Tobacco abuse Counseling done   History of alcohol abuse No active issues.  Counseling done.  Continue multivitamin and thiamine .  No signs of withdrawal.   Severe protein malnutrition Nutrition Status: Body mass index is 22.62 kg/m.  Nutrition Problem: Severe Malnutrition Etiology: chronic illness (cirrhosis) Signs/Symptoms: severe fat depletion, severe muscle depletion Interventions: Ensure Enlive (each supplement provides 350kcal and 20 grams of protein), MVI, Liberalize Diet, Magic cup Continue nutritional supplement.  Continue multivitamin thiamine  and folic acid .   Pressure injury sacrum stage II pressure Wound 10/15/23 0900 Pressure Injury Sacrum Mid Stage 2 -  Partial thickness loss of dermis presenting as a shallow open injury with a red, pink wound bed without slough. (Active)  Unclear if present on admission but continue wound care.   Goals of care Plan for skilled nursing facility for rehabitation.  TOC on board.  DVT prophylaxis: SCDs Start: 10/03/23 2153   Disposition: Awaiting for disposition likely to skilled nursing facility likely in 1 to 2 days.  Status is: Inpatient Remains inpatient appropriate because: Pending clinical improvement, hypokalemia and hypomagnesemia,need for skilled nursing facility placement    Code Status:     Code Status: Full  Code  Family Communication: Spoke with the patient's son [name] on the phone on 11/22/2023  Consultants: GI Interventional radiology Palliative care General surgery Nephrology  Procedures: Paracentesis on 11/19/2023  Anti-infectives:  Bactrim DS   Subjective: Today, patient was seen and examined at bedside.  Patient still complains of pain in the right ankle but better.  Was able to ambulate.  Has been having good bowel movements.  No nausea vomiting fever chills or rigor.   Objective: Vitals:   11/23/23 0430 11/23/23 0821  BP: 114/71 110/70  Pulse: 97 (!) 101  Resp: 16 16  Temp: 98.1 F (36.7 C) 98.5 F (36.9 C)  SpO2: 100% 100%    Intake/Output Summary (Last 24 hours) at 11/23/2023 1017 Last data filed at 11/23/2023 0800 Gross per 24 hour  Intake 355 ml  Output --  Net 355 ml   Filed Weights   11/16/23 0500 11/19/23 0500 11/23/23 0500  Weight: 58.6 kg 56.1 kg 58.1 kg   Body mass index is 23.43 kg/m.   Physical Exam:  General: Thinly built, not in obvious distress Communicative, appears older than stated age HENT:   No scleral pallor or icterus noted. Oral mucosa is moist.  Chest:  Clear breath sounds.  Diminished breath sounds bilaterally. No crackles or wheezes.  CVS: S1 &S2 heard. No murmur.  Regular rate and rhythm. Abdomen: Soft, nontender, mildly distended abdomen, bowel sounds are heard.   Extremities: No cyanosis, clubbing  bilateral lower extremity pitting edema ++, right ankle tenderness on palpation. Psych: Alert, awake and oriented, normal mood CNS:  No cranial nerve deficits.  Power equal in all extremities.   Skin: Warm and dry.  Sacral pressure ulceration   Data Review: I have personally reviewed the following laboratory data and studies,  CBC: Recent Labs  Lab 11/22/23 0851 11/23/23 0523  WBC 6.8 6.6  HGB 8.9* 8.1*  HCT 25.5* 23.0*  MCV 96.6 96.2  PLT 119* 118*   Basic Metabolic Panel: Recent Labs  Lab 11/18/23 0134  11/19/23 0301 11/20/23 1715 11/22/23 0851 11/23/23 0523  NA 125* 126* 128* 125* 128*  K 2.8* 5.6* 3.6 2.7* 3.0*  CL 85* 91* 90* 89* 93*  CO2 28 26 25 23 24   GLUCOSE 106* 88 112* 105* 83  BUN 23* 30* 25* 22* 14  CREATININE 0.56 0.69 0.54 0.65 0.69  CALCIUM  8.4* 8.5* 8.7* 8.5* 8.2*  MG  --   --   --  1.4* 1.5*  PHOS  --   --   --   --  3.2   Liver Function Tests: Recent Labs  Lab 11/18/23 0134 11/22/23 0851 11/23/23 0523  AST 73* 65* 64*  ALT 25 24 22   ALKPHOS 60 57 53  BILITOT 2.3* 2.2* 2.2*  PROT 6.8 7.4 7.0  ALBUMIN  2.5* 2.7* 2.4*   No results for input(s): LIPASE, AMYLASE in the last 168 hours. No results for input(s): AMMONIA in the last 168 hours. Cardiac Enzymes: No results for input(s): CKTOTAL, CKMB, CKMBINDEX, TROPONINI in the last 168 hours. BNP (last 3 results) No results for input(s): BNP in the last 8760 hours.  ProBNP (last 3 results) No results for input(s): PROBNP in the last 8760 hours.  CBG: No results for input(s): GLUCAP in the last 168 hours. Recent Results (from the past 240 hours)  Urine Culture (for pregnant, neutropenic or urologic patients or patients with an indwelling urinary catheter)     Status: Abnormal   Collection Time: 11/19/23 10:38 AM   Specimen: Urine, Random  Result Value Ref Range Status   Specimen Description URINE, RANDOM  Final   Special Requests   Final    NONE Performed at [facility], [address] [place], [name] [address]    Culture >=100,000 COLONIES/mL ENTEROBACTER CLOACAE (A)  Final   Report Status 11/21/2023 FINAL  Final   Organism ID, Bacteria ENTEROBACTER CLOACAE (A)  Final      Susceptibility   Enterobacter cloacae - MIC*    CEFEPIME <=0.12 SENSITIVE Sensitive     ERTAPENEM <=0.12 SENSITIVE Sensitive     CIPROFLOXACIN  <=0.06 SENSITIVE Sensitive     GENTAMICIN <=1 SENSITIVE Sensitive     NITROFURANTOIN 64 INTERMEDIATE Intermediate     TRIMETH/SULFA <=20 SENSITIVE Sensitive      PIP/TAZO Value in next row Sensitive      <=4 SENSITIVEThis is a modified FDA-approved test that has been validated and its performance characteristics determined by the reporting laboratory.  This laboratory is certified under the Clinical Laboratory Improvement Amendments CLIA as qualified to perform high complexity clinical laboratory testing.    MEROPENEM Value in next row Sensitive      <=4 SENSITIVEThis is a modified FDA-approved test that has been validated and its performance characteristics determined by the reporting laboratory.  This laboratory is certified under the Clinical Laboratory Improvement Amendments CLIA as qualified to perform high complexity clinical laboratory testing.    * >=100,000 COLONIES/mL ENTEROBACTER CLOACAE  Studies: DG Ankle 2 Views Right Result Date: 11/22/2023 CLINICAL DATA:  [id number] Ankle pain [id number] EXAM: RIGHT ANKLE - 2 VIEW COMPARISON:  None Available. FINDINGS: Osteopenia. No acute fracture or dislocation. Joint spaces and alignment are maintained. No area of erosion or osseous destruction. No unexpected radiopaque foreign body. Soft tissues are unremarkable. IMPRESSION: 1. No acute fracture or dislocation. Electronically Signed   By: Corean Salter M.D.   On: 11/22/2023 16:29       Vernal Alstrom, MD  [facility] 11/23/2023  If 7PM-7AM, please contact night-coverage

## 2023-11-23 NOTE — Progress Notes (Signed)
 Physical Therapy Treatment Patient Details Name: Joan Wood MRN: [id number] DOB: 16-Oct-1970 Today's Date: 11/23/2023   History of Present Illness 53 y.o. female presents to [facility] hospital on 10/03/2023 with nausea/vomiting and bloody diarrhea. Admitted with decompensated alcoholic cirrhosis w/ ascites and hepatic encephalopathy. 9/25 Upper EGD concerning for possible acute esophageal necrosis, portal hypertension, non-bleeding gastric and duodenal ulcers. Pt also underwent paracentesis on 9/25, 10/6, 10/22. PMH includes decompensated cirrhosis, PUD, GERD, pancreatitis, OSA.    PT Comments  Pt received sitting EOB and agreeable to session. Pt able to tolerate increased gait distance and stair trial with intermittent min A for balance and 1 seated rest break due to fatigue. Pt continues to demonstrate poor safety awareness and memory requiring cues throughout. Noted scrape on L knee with pt reporting she slipped while walking due to ace wrap on R foot, however RN reports pt told her she bumped it on a table. Pt educated on always wearing gripper socks when OOB for safety and pt verbalizing understanding. Pt continues to benefit from PT services to progress toward functional mobility goals.     If plan is discharge home, recommend the following: A lot of help with walking and/or transfers;A lot of help with bathing/dressing/bathroom;Assistance with cooking/housework;Assist for transportation;Help with stairs or ramp for entrance;Direct supervision/assist for medications management;Direct supervision/assist for financial management;Supervision due to cognitive status   Can travel by private vehicle     Yes  Equipment Recommendations  Rolling walker (2 wheels);BSC/3in1    Recommendations for Other Services       Precautions / Restrictions Precautions Precautions: Fall Recall of Precautions/Restrictions: Impaired Precaution/Restrictions Comments: history of orthostasis Restrictions Weight  Bearing Restrictions Per Provider Order: No     Mobility  Bed Mobility               General bed mobility comments: Pt sitting EOB upon entry    Transfers Overall transfer level: Needs assistance Equipment used: Rolling walker (2 wheels) Transfers: Sit to/from Stand Sit to Stand: Contact guard assist           General transfer comment: cues for hand placement and CGA for safety    Ambulation/Gait Ambulation/Gait assistance: Contact guard assist Gait Distance (Feet): 200 Feet Assistive device: Rolling walker (2 wheels) Gait Pattern/deviations: Step-through pattern, Decreased stride length, Drifts right/left, Trunk flexed Gait velocity: decr     General Gait Details: Cues for upright posture and pt intermittently correcting without cues. slow gait with cues for increased step length   Stairs Stairs: Yes Stairs assistance: Min assist Stair Management: Step to pattern, Two rails Number of Stairs: 2 (x2) General stair comments: Cues for sequencing and intermittent min A for stability   Wheelchair Mobility     Tilt Bed    Modified Rankin (Stroke Patients Only)       Balance Overall balance assessment: Needs assistance Sitting-balance support: No upper extremity supported, Feet supported Sitting balance-Leahy Scale: Good     Standing balance support: Bilateral upper extremity supported, Reliant on assistive device for balance, During functional activity Standing balance-Leahy Scale: Fair Standing balance comment: with RW support                            Communication Communication Communication: No apparent difficulties  Cognition Arousal: Alert Behavior During Therapy: Impulsive   PT - Cognitive impairments: Awareness, Problem solving, Safety/Judgement  Following commands: Impaired Following commands impaired: Follows multi-step commands inconsistently    Cueing Cueing Techniques: Verbal cues,  Visual cues  Exercises      General Comments        Pertinent Vitals/Pain Pain Assessment Pain Assessment: Faces Faces Pain Scale: Hurts a little bit Pain Location: B feet Pain Descriptors / Indicators: Grimacing, Discomfort Pain Intervention(s): Monitored during session, Repositioned     PT Goals (current goals can now be found in the care plan section) Acute Rehab PT Goals Patient Stated Goal: to return to independence PT Goal Formulation: With patient Time For Goal Achievement: 11/29/23 Progress towards PT goals: Progressing toward goals    Frequency    Min 2X/week       AM-PAC PT 6 Clicks Mobility   Outcome Measure  Help needed turning from your back to your side while in a flat bed without using bedrails?: A Little Help needed moving from lying on your back to sitting on the side of a flat bed without using bedrails?: A Little Help needed moving to and from a bed to a chair (including a wheelchair)?: A Little Help needed standing up from a chair using your arms (e.g., wheelchair or bedside chair)?: A Little Help needed to walk in hospital room?: A Little Help needed climbing 3-5 steps with a railing? : A Little 6 Click Score: 18    End of Session Equipment Utilized During Treatment: Gait belt Activity Tolerance: Patient tolerated treatment well Patient left: in chair;with chair alarm set;with call bell/phone within reach Nurse Communication: Mobility status PT Visit Diagnosis: Other abnormalities of gait and mobility (R26.89);Muscle weakness (generalized) (M62.81);Unsteadiness on feet (R26.81);Difficulty in walking, not elsewhere classified (R26.2)     Time: 9149-9081 PT Time Calculation (min) (ACUTE ONLY): 28 min  Charges:    $Gait Training: 23-37 mins PT General Charges $$ ACUTE PT VISIT: 1 Visit                    Darryle George, PTA Acute Rehabilitation Services Secure Chat Preferred  [phone]    Darryle George 11/23/2023,  9:39 AM

## 2023-11-24 ENCOUNTER — Inpatient Hospital Stay (HOSPITAL_COMMUNITY)

## 2023-11-24 DIAGNOSIS — K7460 Unspecified cirrhosis of liver: Secondary | ICD-10-CM | POA: Diagnosis not present

## 2023-11-24 DIAGNOSIS — K7290 Hepatic failure, unspecified without coma: Secondary | ICD-10-CM | POA: Diagnosis not present

## 2023-11-24 LAB — CBC
HCT: 24.2 % — ABNORMAL LOW (ref 36.0–46.0)
Hemoglobin: 8.2 g/dL — ABNORMAL LOW (ref 12.0–15.0)
MCH: 33.2 pg (ref 26.0–34.0)
MCHC: 33.9 g/dL (ref 30.0–36.0)
MCV: 98 fL (ref 80.0–100.0)
Platelets: 118 K/uL — ABNORMAL LOW (ref 150–400)
RBC: 2.47 MIL/uL — ABNORMAL LOW (ref 3.87–5.11)
RDW: 17.3 % — ABNORMAL HIGH (ref 11.5–15.5)
WBC: 5.4 K/uL (ref 4.0–10.5)
nRBC: 0 % (ref 0.0–0.2)

## 2023-11-24 LAB — BASIC METABOLIC PANEL WITH GFR
Anion gap: 9 (ref 5–15)
BUN: 16 mg/dL (ref 6–20)
CO2: 21 mmol/L — ABNORMAL LOW (ref 22–32)
Calcium: 8.2 mg/dL — ABNORMAL LOW (ref 8.9–10.3)
Chloride: 98 mmol/L (ref 98–111)
Creatinine, Ser: 0.55 mg/dL (ref 0.44–1.00)
GFR, Estimated: >60 mL/min (ref >60)
Glucose, Bld: 101 mg/dL — ABNORMAL HIGH (ref 70–99)
Potassium: 3.8 mmol/L (ref 3.5–5.1)
Sodium: 128 mmol/L — ABNORMAL LOW (ref 135–145)

## 2023-11-24 LAB — MAGNESIUM: Magnesium: 1.6 mg/dL — ABNORMAL LOW (ref 1.7–2.4)

## 2023-11-24 MED ORDER — COLCHICINE 0.6 MG PO TABS
0.6000 mg | ORAL_TABLET | Freq: Once | ORAL | Status: AC
  Administered 2023-11-24: 0.6 mg via ORAL
  Filled 2023-11-24: qty 1

## 2023-11-24 MED ORDER — GABAPENTIN 300 MG PO CAPS
600.0000 mg | ORAL_CAPSULE | Freq: Three times a day (TID) | ORAL | Status: AC
  Administered 2023-11-24 – 2024-02-15 (×251): 600 mg via ORAL
  Filled 2023-11-24 (×230): qty 2

## 2023-11-24 NOTE — Procedures (Signed)
 PROCEDURE SUMMARY:  Successful image-guided paracentesis from the left lower abdomen.  Yielded 3.6 liters of straw colored fluid.  No immediate complications.  EBL: <1 mL Patient tolerated well.   Specimen not sent for labs.  Please see imaging section of Epic for full dictation.  Anthon Harpole NP 11/24/2023 1:16 PM

## 2023-11-24 NOTE — Plan of Care (Signed)

## 2023-11-24 NOTE — Progress Notes (Signed)
 PROGRESS NOTE  Joan Wood FMW:[id number] DOB: [birth year] DOA: 10/03/2023 PCP: Edman Meade PEDLAR, FNP   LOS: 52 days   Brief narrative:  53 y.o. female with a history of decompensated liver cirrhosis, PUD, gastric ulcer perforation status post ex lap, GERD, pancreatitis, obstructive sleep apnea. Patient presented secondary to nausea, coffee-ground emesis, bloody diarrhea with concern for upper GI bleed. 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 Hospitalization complicated by hepatic encephalopathy treated with lactulose and rifaximin. Patient also requiring recurrent paracenteses for her recurrent ascites.    Assessment/Plan: Principal Problem:   Decompensated cirrhosis (HCC) Active Problems:   GI bleed   Liver cirrhosis (HCC)   Esophageal necrosis   Acute appendicitis   Goals of care, counseling/discussion   Alcohol use disorder   Hyponatremia   Tobacco abuse   Nausea vomiting and diarrhea   Acute on chronic blood loss anemia   ABLA (acute blood loss anemia)   AKI (acute kidney injury)   Pressure injury of skin   Protein-calorie malnutrition, severe   Decompensated alcoholic cirrhosis with ascites Underwent therapeutic paracentesis on 11/12/2023 with removal of 750 cc of ascitic fluid.  Ascitic fluid was not consistent with SBP.  Received IV Rocephin  for UTI.  Being treated for hepatic encephalopathy with lactulose and rifaximin.  Palliative care consulted for ongoing goals of care discussions.  At this time rifaximin has been discontinued at the request of son since it will not be covered at the outpatient facility and would like to see how she does with encephalopathy.  Patient continues to develop recurrent ascites at this time and will request for ultrasound-guided paracentesis for distended abdomen today.   Hepatic  encephalopathy Resolved.  Continue lactulose and rifaximin. Patient's insurance will not cover Rifaximin on discharge.    Had been having adequate bowel movements up to 4-5 times a day so dose was decreased to lactulose to 20 g twice daily.  Will need to titrate to keep the bowel movements 3-4 times a day.  Will observe off rifaximin for now.   Enterobacter UTI. Treated with Bactrim DS twice daily and has completed the course  Hypokalemia Aggressively replenished.  Labs from today pending. Hypomagnesemia.  Magnesium  1.5.  Will replace with 2 g of IV magnesium  sulfate.  Check levels in AM.  Hypoalbuminemia Secondary to underlying liver disease.  Latest albumin  of 2.4.   Thrombocytopenia Secondary to liver disease.  CBC showed hemoglobin of 8.1.  Platelet 119.   Acute kidney injury Presumed secondary to ATN from contrast. Nephrology was consulted and renal function has improved also.  Latest creatinine of 0.6   Acute appendicitis General surgery was consulted and patient was thought to be high surgical risk so was treated conservatively with antibiotics.  Has improved at this time    Chronic hypotension Has received albumin  intermittently.  Currently on midodrine  20 milligram 3 times daily.  Was  on  octreotide  which has been discontinued.  Chronic hyponatremia Patient's baseline appears to be around 125 -128.  Latest sodium of 128. Nephrology was consulted during hospitalization.  Latest BMP pending.   Acute blood loss anemia secondary to acute upper GI bleed from esophagitis and esophageal necrosis Patient was seen by GI during hospitalization and underwent upper GI endoscopy on 10/04/2023 with findings of grade D esophagitis in addition to concern for acute esophageal necrosis. Non-bleeding gastric ulcer noted GI recommended Carafate  for 2 weeks  and Protonix  twice daily with outpatient GI follow-up.  Patient received 4 units of packed RBC during this hospitalization.  Of note patient had  recurrent anemia on 11/02/2023 without any obvious bleeding.  Hemoglobin today at 8.1.  Will transfuse if necessary.    Lower extremity edema, right ankle tenderness. Secondary to hypoalbuminemia from liver disease.  Duplex ultrasound of the lower extremity negative for DVT.   x-ray of the right ankle without any bony issues..  Has neuropathy as well.  On gabapentin  300 mg 3 times daily was increased to 400 mg 3 times daily from 11/22/2023.  on Lasix 40 mg twice daily.  Slightly elevated uric acid level.  Received 0.6 mg of colchicine x 2.  Will repeat 1 more time today.  Tobacco abuse Counseling done   History of alcohol abuse No active issues.  Counseling done.  Continue multivitamin and thiamine .  No signs of withdrawal.   Severe protein malnutrition Nutrition Status: Body mass index is 22.62 kg/m.  Nutrition Problem: Severe Malnutrition Etiology: chronic illness (cirrhosis) Signs/Symptoms: severe fat depletion, severe muscle depletion Interventions: Ensure Enlive (each supplement provides 350kcal and 20 grams of protein), MVI, Liberalize Diet, Magic cup Continue nutritional supplement.  Continue multivitamin thiamine  and folic acid .   Pressure injury sacrum stage II pressure Wound 10/15/23 0900 Pressure Injury Sacrum Mid Stage 2 -  Partial thickness loss of dermis presenting as a shallow open injury with a red, pink wound bed without slough. (Active)  Unclear if present on admission but continue wound care.   Goals of care Plan for skilled nursing facility for rehabitation.  TOC on board.  DVT prophylaxis: SCDs Start: 10/03/23 2153   Disposition: Awaiting for disposition likely to skilled nursing facility likely in 1 to 2 days.  Status is: Inpatient Remains inpatient appropriate because: Pending clinical improvement, electrolyte imbalance, need for skilled nursing facility placement    Code Status:     Code Status: Full Code  Family Communication: Spoke with the patient's  son [name] on the phone on 11/22/2023  Consultants: GI Interventional radiology Palliative care General surgery Nephrology  Procedures: Paracentesis on 11/19/2023  Anti-infectives:  Bactrim DS   Subjective: Today, patient was seen and examined at bedside.  Patient complains of distended abdomen and leg pain and swelling and feels frustrated that she has been in the hospital too long.  Has been having 3-4 bowel movements a day.  No nausea vomiting fever chills or rigor.    Objective: Vitals:   11/24/23 0352 11/24/23 0719  BP: 101/66 109/66  Pulse: 92 (!) 109  Resp: 17 16  Temp: 97.8 F (36.6 C) 98.6 F (37 C)  SpO2: 100% 99%    Intake/Output Summary (Last 24 hours) at 11/24/2023 1101 Last data filed at 11/23/2023 1945 Gross per 24 hour  Intake 572.96 ml  Output 800 ml  Net -227.04 ml   Filed Weights   11/16/23 0500 11/19/23 0500 11/23/23 0500  Weight: 58.6 kg 56.1 kg 58.1 kg   Body mass index is 23.43 kg/m.   Physical Exam:  General: Thinly built, not in obvious distress Communicative, appears older than stated age HENT:   No scleral pallor or icterus noted. Oral mucosa is moist.  Chest:  Clear breath sounds.  Diminished breath sounds bilaterally. No crackles or wheezes.  CVS: S1 &S2 heard. No murmur.  Regular rate and rhythm. Abdomen: Soft, nontender, distended and tense abdomen.  No tenderness., bowel sounds are heard.   Extremities: No cyanosis, clubbing bilateral lower extremity  pitting edema ++, right ankle tenderness on palpation. Psych: Alert, awake and oriented, normal mood CNS:  No cranial nerve deficits.  Moves all extremities. Skin: Warm and dry.  Sacral pressure ulceration   Data Review: I have personally reviewed the following laboratory data and studies,  CBC: Recent Labs  Lab 11/22/23 0851 11/23/23 0523  WBC 6.8 6.6  HGB 8.9* 8.1*  HCT 25.5* 23.0*  MCV 96.6 96.2  PLT 119* 118*   Basic Metabolic Panel: Recent Labs  Lab  11/18/23 0134 11/19/23 0301 11/20/23 1715 11/22/23 0851 11/23/23 0523  NA 125* 126* 128* 125* 128*  K 2.8* 5.6* 3.6 2.7* 3.0*  CL 85* 91* 90* 89* 93*  CO2 28 26 25 23 24   GLUCOSE 106* 88 112* 105* 83  BUN 23* 30* 25* 22* 14  CREATININE 0.56 0.69 0.54 0.65 0.69  CALCIUM  8.4* 8.5* 8.7* 8.5* 8.2*  MG  --   --   --  1.4* 1.5*  PHOS  --   --   --   --  3.2   Liver Function Tests: Recent Labs  Lab 11/18/23 0134 11/22/23 0851 11/23/23 0523  AST 73* 65* 64*  ALT 25 24 22   ALKPHOS 60 57 53  BILITOT 2.3* 2.2* 2.2*  PROT 6.8 7.4 7.0  ALBUMIN  2.5* 2.7* 2.4*   No results for input(s): LIPASE, AMYLASE in the last 168 hours. No results for input(s): AMMONIA in the last 168 hours. Cardiac Enzymes: No results for input(s): CKTOTAL, CKMB, CKMBINDEX, TROPONINI in the last 168 hours. BNP (last 3 results) No results for input(s): BNP in the last 8760 hours.  ProBNP (last 3 results) No results for input(s): PROBNP in the last 8760 hours.  CBG: No results for input(s): GLUCAP in the last 168 hours. Recent Results (from the past 240 hours)  Urine Culture (for pregnant, neutropenic or urologic patients or patients with an indwelling urinary catheter)     Status: Abnormal   Collection Time: 11/19/23 10:38 AM   Specimen: Urine, Random  Result Value Ref Range Status   Specimen Description URINE, RANDOM  Final   Special Requests   Final    NONE Performed at [facility], [address] [place], [name] [address]    Culture >=100,000 COLONIES/mL ENTEROBACTER CLOACAE (A)  Final   Report Status 11/21/2023 FINAL  Final   Organism ID, Bacteria ENTEROBACTER CLOACAE (A)  Final      Susceptibility   Enterobacter cloacae - MIC*    CEFEPIME <=0.12 SENSITIVE Sensitive     ERTAPENEM <=0.12 SENSITIVE Sensitive     CIPROFLOXACIN  <=0.06 SENSITIVE Sensitive     GENTAMICIN <=1 SENSITIVE Sensitive     NITROFURANTOIN 64 INTERMEDIATE Intermediate     TRIMETH/SULFA <=20  SENSITIVE Sensitive     PIP/TAZO Value in next row Sensitive      <=4 SENSITIVEThis is a modified FDA-approved test that has been validated and its performance characteristics determined by the reporting laboratory.  This laboratory is certified under the Clinical Laboratory Improvement Amendments CLIA as qualified to perform high complexity clinical laboratory testing.    MEROPENEM Value in next row Sensitive      <=4 SENSITIVEThis is a modified FDA-approved test that has been validated and its performance characteristics determined by the reporting laboratory.  This laboratory is certified under the Clinical Laboratory Improvement Amendments CLIA as qualified to perform high complexity clinical laboratory testing.    * >=100,000 COLONIES/mL ENTEROBACTER CLOACAE     Studies: DG Ankle 2  Views Right Result Date: 11/22/2023 CLINICAL DATA:  [id number] Ankle pain [id number] EXAM: RIGHT ANKLE - 2 VIEW COMPARISON:  None Available. FINDINGS: Osteopenia. No acute fracture or dislocation. Joint spaces and alignment are maintained. No area of erosion or osseous destruction. No unexpected radiopaque foreign body. Soft tissues are unremarkable. IMPRESSION: 1. No acute fracture or dislocation. Electronically Signed   By: Corean Salter M.D.   On: 11/22/2023 16:29      Vernal Alstrom, MD  [facility] 11/24/2023  If 7PM-7AM, please contact night-coverage

## 2023-11-25 DIAGNOSIS — K7290 Hepatic failure, unspecified without coma: Secondary | ICD-10-CM | POA: Diagnosis not present

## 2023-11-25 DIAGNOSIS — K7460 Unspecified cirrhosis of liver: Secondary | ICD-10-CM | POA: Diagnosis not present

## 2023-11-25 MED ORDER — COLCHICINE 0.6 MG PO TABS
0.6000 mg | ORAL_TABLET | Freq: Once | ORAL | Status: AC
  Administered 2023-11-25: 0.6 mg via ORAL
  Filled 2023-11-25: qty 1

## 2023-11-25 MED ORDER — MELATONIN 3 MG PO TABS
3.0000 mg | ORAL_TABLET | Freq: Every evening | ORAL | Status: AC | PRN
Start: 2023-11-25 — End: ?
  Administered 2023-11-26 – 2024-02-14 (×50): 3 mg via ORAL
  Filled 2023-11-25 (×48): qty 1

## 2023-11-25 MED ORDER — POTASSIUM CHLORIDE CRYS ER 20 MEQ PO TBCR
40.0000 meq | EXTENDED_RELEASE_TABLET | Freq: Once | ORAL | Status: AC
  Administered 2023-11-25: 40 meq via ORAL
  Filled 2023-11-25: qty 2

## 2023-11-25 MED ORDER — MAGNESIUM SULFATE 2 GM/50ML IV SOLN
2.0000 g | Freq: Once | INTRAVENOUS | Status: AC
  Administered 2023-11-25: 2 g via INTRAVENOUS
  Filled 2023-11-25: qty 50

## 2023-11-25 NOTE — Plan of Care (Signed)

## 2023-11-25 NOTE — Progress Notes (Addendum)
 PROGRESS NOTE  Joan Wood FMW:[id number] DOB: March 10, 1970 DOA: 10/03/2023 PCP: Edman Meade PEDLAR, FNP   LOS: 53 days   Brief narrative:  53 y.o. female with a history of decompensated liver cirrhosis, PUD, gastric ulcer perforation status post ex lap, GERD, pancreatitis, obstructive sleep apnea. Patient presented secondary to nausea, coffee-ground emesis, bloody diarrhea with concern for upper GI bleed. 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 Hospitalization complicated by hepatic encephalopathy treated with lactulose and rifaximin. Patient also requiring recurrent paracenteses for her recurrent ascites.    Assessment/Plan: Principal Problem:   Decompensated cirrhosis (HCC) Active Problems:   GI bleed   Liver cirrhosis (HCC)   Esophageal necrosis   Acute appendicitis   Goals of care, counseling/discussion   Alcohol use disorder   Hyponatremia   Tobacco abuse   Nausea vomiting and diarrhea   Acute on chronic blood loss anemia   ABLA (acute blood loss anemia)   AKI (acute kidney injury)   Pressure injury of skin   Protein-calorie malnutrition, severe   Decompensated alcoholic cirrhosis with ascites Underwent therapeutic paracentesis on 11/12/2023 with removal of 750 cc of ascitic fluid.  Ascitic fluid was not consistent with SBP.  Received IV Rocephin  for UTI.  Patient was initially treated with rifaximin and lactulose for encephalopathy.  At this time only on lactulose since rifaximin will not be covered at the outpatient facility.  Underwent ultrasound-guided paracentesis 11/24/2023 with removal of 3.6 L of fluid.  Abdomen soft at this time.  Continue supportive care.   Hepatic encephalopathy Resolved.  Continue lactulose and rifaximin. Patient's insurance will not cover Rifaximin on discharge.    Currently on lactulose with  adequate bowel movements   Enterobacter UTI. Treated with Bactrim DS twice daily and has completed the course  Hypokalemia Latest potassium of 3.8.  Will continue oral supplementation.  Hypomagnesemia.  Magnesium  1.6.  Will replace with 2 g of IV magnesium  sulfate.  Check levels in AM.  Hypoalbuminemia Secondary to underlying liver disease.  Latest albumin  of 2.4.   Thrombocytopenia Secondary to liver disease.  CBC showed hemoglobin of 8.2.  Platelet 118.   Acute kidney injury Presumed secondary to ATN from contrast. Nephrology was consulted and renal function has improved also.  Latest creatinine of 0.5   Acute appendicitis General surgery was consulted and patient was thought to be high surgical risk so was treated conservatively with antibiotics.  Has improved at this time.  No abdominal pain.   Chronic hypotension Has received albumin  intermittently.  Currently on midodrine  20 milligram 3 times daily.  Was  on  octreotide  which has been discontinued.  Latest blood pressure of 114/69  Chronic hyponatremia Patient's baseline appears to be around 125 -128.  Latest sodium of 128. Nephrology was consulted during hospitalization.     Acute blood loss anemia secondary to acute upper GI bleed from esophagitis and esophageal necrosis Patient was seen by GI during hospitalization and underwent upper GI endoscopy on 10/04/2023 with findings of grade D esophagitis in addition to concern for acute esophageal necrosis. Non-bleeding gastric ulcer noted GI recommended Carafate  for 2 weeks and Protonix  twice daily with outpatient GI follow-up.  Patient received 4 units of packed RBC during this hospitalization.  Of note patient had recurrent anemia on 11/02/2023 without any obvious bleeding.  Latest hemoglobin of 8.2.  Will transfuse if necessary.    Lower extremity edema, right ankle  tenderness. Peripheral neuropathy Secondary to hypoalbuminemia from liver disease.  Duplex ultrasound of the lower  extremity negative for DVT.   x-ray of the right ankle without any bony issues..  Gabapentin  dose has been increased from 300 to 600 mg 3 times daily at this time.  Will need to closely monitor for sedation..  on Lasix 40 mg twice daily.  Slightly elevated uric acid level.  Received few doses of colchicine in the hospital.  Will give 1 more dose of colchicine for  Tobacco abuse Counseling done   History of alcohol abuse No active issues.  Counseling done.  Continue multivitamin and thiamine .  No signs of withdrawal.   Severe protein malnutrition Nutrition Status: Body mass index is 22.62 kg/m.  Nutrition Problem: Severe Malnutrition Etiology: chronic illness (cirrhosis) Signs/Symptoms: severe fat depletion, severe muscle depletion Interventions: Ensure Enlive (each supplement provides 350kcal and 20 grams of protein), MVI, Liberalize Diet, Magic cup Continue nutritional supplement.  Continue multivitamin thiamine  and folic acid .   Pressure injury sacrum stage II pressure Wound 10/15/23 0900 Pressure Injury Sacrum Mid Stage 2 -  Partial thickness loss of dermis presenting as a shallow open injury with a red, pink wound bed without slough. (Active)  Unclear if present on admission but continue wound care.   Goals of care Plan for skilled nursing facility for rehabitation.  TOC on board.  DVT prophylaxis: SCDs Start: 10/03/23 2153   Disposition: Awaiting for disposition likely to skilled nursing facility likely in 1 to 2 days.  Medically stable for disposition.  Status is: Inpatient  Remains inpatient appropriate because: Pending clinical improvement,, need for skilled nursing facility placement    Code Status:     Code Status: Full Code  Family Communication: Spoke with the patient's son [name] on the phone on 11/22/2023  Consultants: GI Interventional radiology Palliative care General surgery Nephrology  Procedures: Paracentesis on  11/19/2023  Anti-infectives:  Bactrim DS-completed   Subjective: Today, patient was seen and examined at bedside.  Complains of feeling little better in her abdomen today after paracentesis.  Still complains of leg pain and swelling but swelling has decreased some as per patient.   Objective: Vitals:   11/25/23 0404 11/25/23 0858  BP: 119/67 114/69  Pulse:  90  Resp: 20 18  Temp: 99.9 F (37.7 C) 99.3 F (37.4 C)  SpO2: 100% 100%    Intake/Output Summary (Last 24 hours) at 11/25/2023 1127 Last data filed at 11/25/2023 0230 Gross per 24 hour  Intake 1337 ml  Output 350 ml  Net 987 ml   Filed Weights   11/16/23 0500 11/19/23 0500 11/23/23 0500  Weight: 58.6 kg 56.1 kg 58.1 kg   Body mass index is 23.43 kg/m.   Physical Exam:  General: Thinly built, not in obvious distress Communicative, appears older than stated age HENT:   No scleral pallor or icterus noted. Oral mucosa is moist.  Chest: Diminished breath sounds bilaterally no crackles or wheezes.  CVS: S1 &S2 heard. No murmur.  Regular rate and rhythm. Abdomen: Soft, nontender, distended and tense abdomen.  No tenderness., bowel sounds are heard.   Extremities: No cyanosis, clubbing bilateral lower extremity pitting edema ++, right ankle tenderness on palpation. Psych: Alert, awake and oriented, normal mood CNS:  No cranial nerve deficits.  Moves all extremities. Skin: Warm and dry.  Sacral pressure ulceration   Data Review: I have personally reviewed the following laboratory data and studies,  CBC: Recent Labs  Lab 11/22/23 0851 11/23/23 0523 11/24/23  1534  WBC 6.8 6.6 5.4  HGB 8.9* 8.1* 8.2*  HCT 25.5* 23.0* 24.2*  MCV 96.6 96.2 98.0  PLT 119* 118* 118*   Basic Metabolic Panel: Recent Labs  Lab 11/19/23 0301 11/20/23 1715 11/22/23 0851 11/23/23 0523 11/24/23 1534  NA 126* 128* 125* 128* 128*  K 5.6* 3.6 2.7* 3.0* 3.8  CL 91* 90* 89* 93* 98  CO2 26 25 23 24  21*  GLUCOSE 88 112* 105* 83 101*   BUN 30* 25* 22* 14 16  CREATININE 0.69 0.54 0.65 0.69 0.55  CALCIUM  8.5* 8.7* 8.5* 8.2* 8.2*  MG  --   --  1.4* 1.5* 1.6*  PHOS  --   --   --  3.2  --    Liver Function Tests: Recent Labs  Lab 11/22/23 0851 11/23/23 0523  AST 65* 64*  ALT 24 22  ALKPHOS 57 53  BILITOT 2.2* 2.2*  PROT 7.4 7.0  ALBUMIN  2.7* 2.4*   No results for input(s): LIPASE, AMYLASE in the last 168 hours. No results for input(s): AMMONIA in the last 168 hours. Cardiac Enzymes: No results for input(s): CKTOTAL, CKMB, CKMBINDEX, TROPONINI in the last 168 hours. BNP (last 3 results) No results for input(s): BNP in the last 8760 hours.  ProBNP (last 3 results) No results for input(s): PROBNP in the last 8760 hours.  CBG: No results for input(s): GLUCAP in the last 168 hours. Recent Results (from the past 240 hours)  Urine Culture (for pregnant, neutropenic or urologic patients or patients with an indwelling urinary catheter)     Status: Abnormal   Collection Time: 11/19/23 10:38 AM   Specimen: Urine, Random  Result Value Ref Range Status   Specimen Description URINE, RANDOM  Final   Special Requests   Final    NONE Performed at [facility], [address], [place], [name] [address]    Culture >=100,000 COLONIES/mL ENTEROBACTER CLOACAE (A)  Final   Report Status 11/21/2023 FINAL  Final   Organism ID, Bacteria ENTEROBACTER CLOACAE (A)  Final      Susceptibility   Enterobacter cloacae - MIC*    CEFEPIME <=0.12 SENSITIVE Sensitive     ERTAPENEM <=0.12 SENSITIVE Sensitive     CIPROFLOXACIN  <=0.06 SENSITIVE Sensitive     GENTAMICIN <=1 SENSITIVE Sensitive     NITROFURANTOIN 64 INTERMEDIATE Intermediate     TRIMETH/SULFA <=20 SENSITIVE Sensitive     PIP/TAZO Value in next row Sensitive      <=4 SENSITIVEThis is a modified FDA-approved test that has been validated and its performance characteristics determined by the reporting laboratory.  This laboratory is certified under the  Clinical Laboratory Improvement Amendments CLIA as qualified to perform high complexity clinical laboratory testing.    MEROPENEM Value in next row Sensitive      <=4 SENSITIVEThis is a modified FDA-approved test that has been validated and its performance characteristics determined by the reporting laboratory.  This laboratory is certified under the Clinical Laboratory Improvement Amendments CLIA as qualified to perform high complexity clinical laboratory testing.    * >=100,000 COLONIES/mL ENTEROBACTER CLOACAE     Studies: US  Paracentesis Result Date: 11/24/2023 INDICATION: History of decompensated cirrhosis with recurrent ascites. Consult for therapeutic paracentesis. Most recent paracentesis 11/12/23 (750 mL). EXAM: ULTRASOUND GUIDED THERAPEUTIC PARACENTESIS MEDICATIONS: 10 mL 1% lidocaine  COMPLICATIONS: None immediate. PROCEDURE: Informed written consent was obtained from the patient after a discussion of the risks, benefits and alternatives to treatment. A timeout was performed prior to the initiation  of the procedure. Initial ultrasound scanning demonstrates a moderate amount of ascites within the left lower abdominal quadrant. The left lower abdomen was prepped and draped in the usual sterile fashion. 1% lidocaine  was used for local anesthesia. Following this, a 19 gauge, 7-cm, Yueh catheter was introduced. An ultrasound image was saved for documentation purposes. The paracentesis was performed. The catheter was removed and a dressing was applied. The patient tolerated the procedure well without immediate post procedural complication. FINDINGS: A total of approximately 3.6 liters of straw colored fluid was removed. IMPRESSION: Successful ultrasound-guided paracentesis yielding 3.6 liters of peritoneal fluid. PLAN: The patient has required >/=2 paracenteses in a 30 day period and a formal evaluation by the [facility] has been arranged. Performed by  Laymon Coast, NP under the supervision of Dr. Luverne. Electronically Signed   By: Marcey Luverne M.D.   On: 11/24/2023 13:33      Vernal Alstrom, MD  [facility] 11/25/2023  If 7PM-7AM, please contact night-coverage

## 2023-11-26 DIAGNOSIS — K7460 Unspecified cirrhosis of liver: Secondary | ICD-10-CM | POA: Diagnosis not present

## 2023-11-26 DIAGNOSIS — K7290 Hepatic failure, unspecified without coma: Secondary | ICD-10-CM | POA: Diagnosis not present

## 2023-11-26 LAB — CBC
HCT: 24.5 % — ABNORMAL LOW (ref 36.0–46.0)
Hemoglobin: 8.5 g/dL — ABNORMAL LOW (ref 12.0–15.0)
MCH: 33.9 pg (ref 26.0–34.0)
MCHC: 34.7 g/dL (ref 30.0–36.0)
MCV: 97.6 fL (ref 80.0–100.0)
Platelets: 98 K/uL — ABNORMAL LOW (ref 150–400)
RBC: 2.51 MIL/uL — ABNORMAL LOW (ref 3.87–5.11)
RDW: 17.5 % — ABNORMAL HIGH (ref 11.5–15.5)
WBC: 4.8 K/uL (ref 4.0–10.5)
nRBC: 0 % (ref 0.0–0.2)

## 2023-11-26 LAB — BASIC METABOLIC PANEL WITH GFR
Anion gap: 9 (ref 5–15)
BUN: 20 mg/dL (ref 6–20)
CO2: 20 mmol/L — ABNORMAL LOW (ref 22–32)
Calcium: 8.1 mg/dL — ABNORMAL LOW (ref 8.9–10.3)
Chloride: 95 mmol/L — ABNORMAL LOW (ref 98–111)
Creatinine, Ser: 0.55 mg/dL (ref 0.44–1.00)
GFR, Estimated: >60 mL/min (ref >60)
Glucose, Bld: 97 mg/dL (ref 70–99)
Potassium: 4.5 mmol/L (ref 3.5–5.1)
Sodium: 124 mmol/L — ABNORMAL LOW (ref 135–145)

## 2023-11-26 LAB — MAGNESIUM: Magnesium: 1.8 mg/dL (ref 1.7–2.4)

## 2023-11-26 MED ORDER — COLCHICINE 0.6 MG PO TABS
0.6000 mg | ORAL_TABLET | Freq: Once | ORAL | Status: AC
  Administered 2023-11-26: 0.6 mg via ORAL
  Filled 2023-11-26: qty 1

## 2023-11-26 MED ORDER — LACTULOSE 10 GM/15ML PO SOLN
20.0000 g | Freq: Three times a day (TID) | ORAL | Status: DC
  Administered 2023-11-26 – 2023-12-20 (×65): 20 g via ORAL
  Filled 2023-11-26 (×67): qty 30

## 2023-11-26 NOTE — Progress Notes (Signed)
 Occupational Therapy Treatment Patient Details Name: Joan Wood MRN: [id number] DOB: [birth year] Today's Date: 11/26/2023   History of present illness 53 y.o. female presents to [facility] hospital on 10/03/2023 with nausea/vomiting and bloody diarrhea. Admitted with decompensated alcoholic cirrhosis w/ ascites and hepatic encephalopathy. 9/25 Upper EGD concerning for possible acute esophageal necrosis, portal hypertension, non-bleeding gastric and duodenal ulcers. Pt also underwent paracentesis on 9/25, 10/6, 10/22. PMH includes decompensated cirrhosis, PUD, GERD, pancreatitis, OSA.   OT comments  Pt progressing toward established OT goals. Able to perform figure 4 for LB ADL this session with min A for threading sock this date. Motivated to ambulate into hall today and needing CGA and UE support from RW. Oriented this session and following up to 2 step commands.       If plan is discharge home, recommend the following:  A little help with walking and/or transfers;A little help with bathing/dressing/bathroom;Direct supervision/assist for medications management;Direct supervision/assist for financial management;Assist for transportation;Supervision due to cognitive status   Equipment Recommendations  BSC/3in1    Recommendations for Other Services      Precautions / Restrictions Precautions Precautions: Fall Recall of Precautions/Restrictions: Impaired Precaution/Restrictions Comments: history of orthostasis Restrictions Weight Bearing Restrictions Per Provider Order: No       Mobility Bed Mobility               General bed mobility comments: received EOB and on EOB with bed alarm set on departure    Transfers Overall transfer level: Needs assistance Equipment used: Rolling walker (2 wheels) Transfers: Sit to/from Stand Sit to Stand: Supervision                 Balance Overall balance assessment: Needs assistance Sitting-balance support: No upper extremity supported,  Feet supported Sitting balance-Leahy Scale: Good     Standing balance support: Bilateral upper extremity supported, Reliant on assistive device for balance, During functional activity Standing balance-Leahy Scale: Fair                             ADL either performed or assessed with clinical judgement   ADL Overall ADL's : Needs assistance/impaired Eating/Feeding: Set up;Sitting   Grooming: Contact guard assist;Standing;Wash/dry hands               Lower Body Dressing: Sitting/lateral leans;Minimal assistance Lower Body Dressing Details (indicate cue type and reason): donning socks using figure 4 with light assist to bring around heel Toilet Transfer: Contact guard assist;Ambulation;Regular Toilet;Rolling walker (2 wheels)           Functional mobility during ADLs: Contact guard assist;Rolling walker (2 wheels)      Extremity/Trunk Assessment Upper Extremity Assessment Upper Extremity Assessment: Generalized weakness   Lower Extremity Assessment Lower Extremity Assessment: Defer to PT evaluation        Vision   Vision Assessment?: Wears glasses for reading;Wears glasses for driving   Perception     Praxis     Communication Communication Communication: No apparent difficulties   Cognition Arousal: Alert Behavior During Therapy: Impulsive, Lability Cognition: Cognition impaired     Awareness: Intellectual awareness impaired Memory impairment (select all impairments): Short-term memory, Working memory Attention impairment (select first level of impairment): Sustained attention Executive functioning impairment (select all impairments): Organization, Reasoning, Problem solving, Initiation OT - Cognition Comments: extremely tangential & labile, redirects and self-soothes quickly. poor awareness at times  Following commands: Impaired Following commands impaired: Follows multi-step commands inconsistently      Cueing    Cueing Techniques: Verbal cues, Visual cues  Exercises      Shoulder Instructions       General Comments edematous BLE (+2 pitting edema distal>proximal),    Pertinent Vitals/ Pain       Pain Assessment Pain Assessment: Faces Faces Pain Scale: No hurt Pain Intervention(s): Monitored during session  Home Living                                          Prior Functioning/Environment              Frequency  Min 2X/week        Progress Toward Goals  OT Goals(current goals can now be found in the care plan section)  Progress towards OT goals: Progressing toward goals  Acute Rehab OT Goals Patient Stated Goal: get better OT Goal Formulation: With patient Time For Goal Achievement: 12/05/23 Potential to Achieve Goals: Fair ADL Goals Pt Will Perform Grooming: with supervision;standing Pt Will Perform Lower Body Dressing: with supervision;sitting/lateral leans;sit to/from stand;with adaptive equipment Pt Will Transfer to Toilet: with supervision;ambulating;regular height toilet;grab bars Pt Will Perform Toileting - Clothing Manipulation and hygiene: with supervision;sitting/lateral leans;sit to/from stand Pt/caregiver will Perform Home Exercise Program: Both right and left upper extremity;With written HEP provided;With theraband  Plan      Co-evaluation                 AM-PAC OT 6 Clicks Daily Activity     Outcome Measure   Help from another person eating meals?: None Help from another person taking care of personal grooming?: A Little Help from another person toileting, which includes using toliet, bedpan, or urinal?: A Lot Help from another person bathing (including washing, rinsing, drying)?: A Lot Help from another person to put on and taking off regular upper body clothing?: A Little Help from another person to put on and taking off regular lower body clothing?: A Lot 6 Click Score: 16    End of Session Equipment Utilized During  Treatment: Rolling walker (2 wheels)  OT Visit Diagnosis: Other abnormalities of gait and mobility (R26.89);Muscle weakness (generalized) (M62.81);Pain;Other symptoms and signs involving cognitive function Pain - Right/Left: Right   Activity Tolerance Patient tolerated treatment well   Patient Left in bed;Other (comment)   Nurse Communication Mobility status        Time: 8494-8468 OT Time Calculation (min): 26 min  Charges: OT General Charges $OT Visit: 1 Visit OT Treatments $Self Care/Home Management : 23-37 mins  Elma JONETTA Lebron FREDERICK, OTR/L [facility] Acute Rehabilitation Office: [phone]   Elma JONETTA Lebron 11/26/2023, 3:37 PM

## 2023-11-26 NOTE — Plan of Care (Signed)

## 2023-11-26 NOTE — Progress Notes (Signed)
 PROGRESS NOTE  Joan Wood FMW:[id number] DOB: October 29, 1970 DOA: 10/03/2023 PCP: Edman Meade PEDLAR, FNP   LOS: 54 days   Brief narrative:  53 y.o. female with a history of decompensated liver cirrhosis, PUD, gastric ulcer perforation status post ex lap, GERD, pancreatitis, obstructive sleep apnea. Patient presented secondary to nausea, coffee-ground emesis, bloody diarrhea with concern for upper GI bleed. 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 Hospitalization complicated by hepatic encephalopathy treated with lactulose and rifaximin. Patient also requiring recurrent paracenteses for her recurrent ascites.    Assessment/Plan: Principal Problem:   Decompensated cirrhosis (HCC) Active Problems:   GI bleed   Liver cirrhosis (HCC)   Esophageal necrosis   Acute appendicitis   Goals of care, counseling/discussion   Alcohol use disorder   Hyponatremia   Tobacco abuse   Nausea vomiting and diarrhea   Acute on chronic blood loss anemia   ABLA (acute blood loss anemia)   AKI (acute kidney injury)   Pressure injury of skin   Protein-calorie malnutrition, severe   Decompensated alcoholic cirrhosis with ascites Underwent therapeutic paracentesis on 11/12/2023 with removal of 750 cc of ascitic fluid.  Ascitic fluid was not consistent with SBP.  Received IV Rocephin  for UTI.  Patient was initially treated with rifaximin and lactulose for encephalopathy.  At this time only on lactulose since rifaximin will not be covered at the outpatient facility.  Underwent ultrasound-guided paracentesis 11/24/2023 with removal of 3.6 L of fluid.  Abdomen soft at this time.  Feels better today.  Continue supportive care.   Hepatic encephalopathy Resolved.  Continue lactulose and rifaximin. Patient's insurance will not cover Rifaximin on discharge.    Currently  on lactulose with 2 bowel movements.  Will need to uptitrate to lactulose.   Enterobacter UTI. Treated with Bactrim DS twice daily and has completed the course  Hypokalemia Latest potassium of 3.8.  Will continue oral supplementation.  Hypomagnesemia.  Magnesium  of 1.8 today after replacement with magnesium  sulfate  Hypoalbuminemia Secondary to underlying liver disease.  Latest albumin  of 2.4.   Thrombocytopenia Secondary to liver disease.  CBC showed hemoglobin of 8.2.  Platelet 98.   Acute kidney injury Presumed secondary to ATN from contrast. Nephrology was consulted and renal function has improved also.  Latest creatinine of 0.5   Acute appendicitis General surgery was consulted and patient was thought to be high surgical risk so was treated conservatively with antibiotics.  Has improved at this time.  No abdominal pain.   Chronic hypotension Has received albumin  intermittently.  Currently on midodrine  20 milligram 3 times daily.  Was  on  octreotide  which has been discontinued.  Latest blood pressure of 114/69  Chronic hyponatremia Patient's baseline appears to be around 125 -128.  Latest sodium of 124. Nephrology was consulted during hospitalization.     Acute blood loss anemia secondary to acute upper GI bleed from esophagitis and esophageal necrosis Patient was seen by GI during hospitalization and underwent upper GI endoscopy on 10/04/2023 with findings of grade D esophagitis in addition to concern for acute esophageal necrosis. Non-bleeding gastric ulcer noted GI recommended Carafate  for 2 weeks and Protonix  twice daily with outpatient GI follow-up.  Patient received 4 units of packed RBC during this hospitalization.  Of note patient had recurrent anemia on 11/02/2023 without any obvious bleeding.  Latest hemoglobin of 8.5.  Will transfuse if necessary.    Lower extremity  edema, right ankle tenderness. Peripheral neuropathy Secondary to hypoalbuminemia from liver disease.   Duplex ultrasound of the lower extremity negative for DVT.   x-ray of the right ankle without any bony issues..  Gabapentin  dose has been increased from 300 to 600 mg 3 times daily at this time.  Will need to closely monitor for sedation..  on Lasix 40 mg twice daily.  Slightly elevated uric acid level.  Received few doses of colchicine in the hospital.  Will give 1 more dose of colchicine for today   Tobacco abuse Counseling done   History of alcohol abuse No active issues.  Counseling done.  Continue multivitamin and thiamine .  No signs of withdrawal.   Severe protein malnutrition Nutrition Status: Body mass index is 22.62 kg/m.  Nutrition Problem: Severe Malnutrition Etiology: chronic illness (cirrhosis) Signs/Symptoms: severe fat depletion, severe muscle depletion Interventions: Ensure Enlive (each supplement provides 350kcal and 20 grams of protein), MVI, Liberalize Diet, Magic cup Continue nutritional supplement.  Continue multivitamin thiamine  and folic acid .   Pressure injury sacrum stage II pressure Wound 10/15/23 0900 Pressure Injury Sacrum Mid Stage 2 -  Partial thickness loss of dermis presenting as a shallow open injury with a red, pink wound bed without slough. (Active)  Unclear if present on admission but continue wound care.   Goals of care Plan for skilled nursing facility for rehabitation.  TOC on board.  DVT prophylaxis: SCDs Start: 10/03/23 2153   Disposition: Awaiting for disposition likely to skilled nursing facility likely in 1 to 2 days.  Medically stable for disposition.  Status is: Inpatient  Remains inpatient appropriate because: Pending clinical improvement,, need for skilled nursing facility placement    Code Status:     Code Status: Full Code  Family Communication: Spoke with the patient's son [name] on the phone on 11/22/2023  Consultants: GI Interventional radiology Palliative care General surgery Nephrology  Procedures: Paracentesis on  11/19/2023  Anti-infectives:  Bactrim DS-completed   Subjective: Today, patient was seen and examined at bedside.  Complains of feeling little better in her abdomen today after paracentesis.  Still complains of leg pain and swelling but swelling has decreased some as per patient.   Objective: Vitals:   11/26/23 1137 11/26/23 1202  BP: (!) 93/57 101/64  Pulse: 84 84  Resp: 18 18  Temp: 98.2 F (36.8 C) 97.9 F (36.6 C)  SpO2: 100% 100%    Intake/Output Summary (Last 24 hours) at 11/26/2023 1323 Last data filed at 11/25/2023 1800 Gross per 24 hour  Intake 911 ml  Output --  Net 911 ml   Filed Weights   11/16/23 0500 11/19/23 0500 11/23/23 0500  Weight: 58.6 kg 56.1 kg 58.1 kg   Body mass index is 23.43 kg/m.   Physical Exam:  General: Thinly built, not in obvious distress Communicative, appears older than stated age HENT:   No scleral pallor or icterus noted. Oral mucosa is moist.  Chest: Diminished breath sounds bilaterally no crackles or wheezes.  CVS: S1 &S2 heard. No murmur.  Regular rate and rhythm. Abdomen: Soft, nontender, distended abdomen but nontender., bowel sounds are heard.   Extremities: No cyanosis, clubbing bilateral lower extremity pitting edema ++, right ankle tenderness on palpation. Psych: Alert, awake and oriented, normal mood CNS:  No cranial nerve deficits.  Moves all extremities. Skin: Warm and dry.  Sacral pressure ulceration   Data Review: I have personally reviewed the following laboratory data and studies,  CBC: Recent Labs  Lab 11/22/23 0851 11/23/23  9476 11/24/23 1534 11/26/23 0227  WBC 6.8 6.6 5.4 4.8  HGB 8.9* 8.1* 8.2* 8.5*  HCT 25.5* 23.0* 24.2* 24.5*  MCV 96.6 96.2 98.0 97.6  PLT 119* 118* 118* 98*   Basic Metabolic Panel: Recent Labs  Lab 11/20/23 1715 11/22/23 0851 11/23/23 0523 11/24/23 1534 11/26/23 0227  NA 128* 125* 128* 128* 124*  K 3.6 2.7* 3.0* 3.8 4.5  CL 90* 89* 93* 98 95*  CO2 25 23 24  21* 20*   GLUCOSE 112* 105* 83 101* 97  BUN 25* 22* 14 16 20   CREATININE 0.54 0.65 0.69 0.55 0.55  CALCIUM  8.7* 8.5* 8.2* 8.2* 8.1*  MG  --  1.4* 1.5* 1.6* 1.8  PHOS  --   --  3.2  --   --    Liver Function Tests: Recent Labs  Lab 11/22/23 0851 11/23/23 0523  AST 65* 64*  ALT 24 22  ALKPHOS 57 53  BILITOT 2.2* 2.2*  PROT 7.4 7.0  ALBUMIN  2.7* 2.4*   No results for input(s): LIPASE, AMYLASE in the last 168 hours. No results for input(s): AMMONIA in the last 168 hours. Cardiac Enzymes: No results for input(s): CKTOTAL, CKMB, CKMBINDEX, TROPONINI in the last 168 hours. BNP (last 3 results) No results for input(s): BNP in the last 8760 hours.  ProBNP (last 3 results) No results for input(s): PROBNP in the last 8760 hours.  CBG: No results for input(s): GLUCAP in the last 168 hours. Recent Results (from the past 240 hours)  Urine Culture (for pregnant, neutropenic or urologic patients or patients with an indwelling urinary catheter)     Status: Abnormal   Collection Time: 11/19/23 10:38 AM   Specimen: Urine, Random  Result Value Ref Range Status   Specimen Description URINE, RANDOM  Final   Special Requests   Final    NONE Performed at [facility], [address] [place], [name] [address]    Culture >=100,000 COLONIES/mL ENTEROBACTER CLOACAE (A)  Final   Report Status 11/21/2023 FINAL  Final   Organism ID, Bacteria ENTEROBACTER CLOACAE (A)  Final      Susceptibility   Enterobacter cloacae - MIC*    CEFEPIME <=0.12 SENSITIVE Sensitive     ERTAPENEM <=0.12 SENSITIVE Sensitive     CIPROFLOXACIN  <=0.06 SENSITIVE Sensitive     GENTAMICIN <=1 SENSITIVE Sensitive     NITROFURANTOIN 64 INTERMEDIATE Intermediate     TRIMETH/SULFA <=20 SENSITIVE Sensitive     PIP/TAZO Value in next row Sensitive      <=4 SENSITIVEThis is a modified FDA-approved test that has been validated and its performance characteristics determined by the reporting laboratory.  This  laboratory is certified under the Clinical Laboratory Improvement Amendments CLIA as qualified to perform high complexity clinical laboratory testing.    MEROPENEM Value in next row Sensitive      <=4 SENSITIVEThis is a modified FDA-approved test that has been validated and its performance characteristics determined by the reporting laboratory.  This laboratory is certified under the Clinical Laboratory Improvement Amendments CLIA as qualified to perform high complexity clinical laboratory testing.    * >=100,000 COLONIES/mL ENTEROBACTER CLOACAE     Studies: No results found.     Vernal Alstrom, MD  [facility] 11/26/2023  If 7PM-7AM, please contact night-coverage

## 2023-11-27 DIAGNOSIS — K7290 Hepatic failure, unspecified without coma: Secondary | ICD-10-CM | POA: Diagnosis not present

## 2023-11-27 DIAGNOSIS — K7460 Unspecified cirrhosis of liver: Secondary | ICD-10-CM | POA: Diagnosis not present

## 2023-11-27 MED ORDER — HYDROXYZINE HCL 25 MG PO TABS
25.0000 mg | ORAL_TABLET | Freq: Once | ORAL | Status: AC | PRN
  Administered 2023-11-27: 25 mg via ORAL
  Filled 2023-11-27: qty 1

## 2023-11-27 MED ORDER — HYDROXYZINE HCL 25 MG PO TABS
25.0000 mg | ORAL_TABLET | Freq: Three times a day (TID) | ORAL | Status: DC | PRN
  Administered 2023-11-27 – 2024-02-05 (×105): 25 mg via ORAL
  Filled 2023-11-27 (×99): qty 1

## 2023-11-27 NOTE — Progress Notes (Signed)
 PROGRESS NOTE  Joan Wood FMW:[id number] DOB: 15-May-1970 DOA: 10/03/2023 PCP: Edman Meade PEDLAR, FNP   LOS: 55 days   Brief narrative:  53 y.o. female with a history of decompensated liver cirrhosis, PUD, gastric ulcer perforation status post ex lap, GERD, pancreatitis, obstructive sleep apnea. Patient presented secondary to nausea, coffee-ground emesis, bloody diarrhea with concern for upper GI bleed. 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 Hospitalization complicated by hepatic encephalopathy treated with lactulose and rifaximin followed by lactulose alone. Patient also requiring recurrent paracenteses for her recurrent ascites.  Currently patient is awaiting for placement.  Assessment/Plan: Principal Problem:   Decompensated cirrhosis (HCC) Active Problems:   GI bleed   Liver cirrhosis (HCC)   Esophageal necrosis   Acute appendicitis   Goals of care, counseling/discussion   Alcohol use disorder   Hyponatremia   Tobacco abuse   Nausea vomiting and diarrhea   Acute on chronic blood loss anemia   ABLA (acute blood loss anemia)   AKI (acute kidney injury)   Pressure injury of skin   Protein-calorie malnutrition, severe   Decompensated alcoholic cirrhosis with recurrent ascites Underwent therapeutic paracentesis on 11/12/2023 with removal of 750 cc of ascitic fluid.  Ascitic fluid was not consistent with SBP.  Received IV Rocephin  for UTI.  Patient was initially treated with rifaximin and lactulose for encephalopathy.  At this time only on lactulose has been continued since rifaximin will not be covered at the outpatient facility.  Patient underwent  ultrasound-guided paracentesis 11/24/2023 again with removal of 3.6 L of fluid.  Likely will need repeat paracentesis prior to discharge.     Hepatic encephalopathy Resolved.   Continue lactulose to keep bowel movements 3-4 times a day.   Enterobacter UTI. Treated with Bactrim DS twice daily and has completed the course  Hypokalemia Improved after replacement.  Latest potassium of 4.5.  Hypomagnesemia.  Replenished and latest magnesium  of 1.8   Hypoalbuminemia Secondary to underlying liver disease.  Latest albumin  of 2.4.  Causing peripheral edema and recurrent ascites.   Thrombocytopenia Secondary to liver disease.  Latest CBC showed hemoglobin of 8.2.  Platelet 98.   Acute kidney injury Presumed secondary to ATN from contrast. Nephrology was consulted and renal function has improved also.  Latest creatinine of 0.5   Acute appendicitis General surgery was consulted and patient was thought to be high surgical risk so was treated conservatively with antibiotics.  No further abdominal pain.  Chronic hypotension Has received albumin  intermittently.  Currently on midodrine  20 milligram 3 times daily.  Was  on  octreotide  which has been discontinued.  Latest blood pressure of 100/67.  Chronic hyponatremia Patient's baseline appears to be around 125 -128.  Latest sodium of 124. Nephrology was consulted during hospitalization.     Acute blood loss anemia secondary to acute upper GI bleed from esophagitis and esophageal necrosis Patient was seen by GI during hospitalization and underwent upper GI endoscopy on 10/04/2023 with findings of grade D esophagitis in addition to concern for acute esophageal necrosis. Non-bleeding gastric ulcer noted GI recommended Carafate  for 2 weeks and Protonix  twice daily with outpatient GI follow-up.  Patient received 4 units of packed RBC during this hospitalization.   Latest hemoglobin of 8.5.  Will transfuse if necessary.    Lower extremity edema, right ankle tenderness. Peripheral neuropathy Secondary to hypoalbuminemia from liver disease.  Duplex ultrasound of the lower extremity  negative for DVT.   x-ray of the right ankle  without any bony issues.  Gabapentin  dose has been increased from 300 milligram to 600 mg 3 times daily.  Will need to closely monitor for sedation..  on Lasix 40 mg twice daily.  Slightly elevated uric acid level.  Receiving colchicine 0.6 mg daily for last 3 to 4 days.  Will hold off.    Tobacco abuse Counseling done   History of alcohol abuse No active issues.  Counseling done.  Continue multivitamin and thiamine .  No signs of withdrawal.   Severe protein malnutrition Nutrition Status: Body mass index is 22.62 kg/m.  Nutrition Problem: Severe Malnutrition Etiology: chronic illness (cirrhosis) Signs/Symptoms: severe fat depletion, severe muscle depletion Interventions: Ensure Enlive (each supplement provides 350kcal and 20 grams of protein), MVI, Liberalize Diet, Magic cup Continue nutritional supplement.  Continue multivitamin thiamine  and folic acid .   Pressure injury sacrum stage II pressure Wound 10/15/23 0900 Pressure Injury Sacrum Mid Stage 2 -  Partial thickness loss of dermis presenting as a shallow open injury with a red, pink wound bed without slough. (Active)  Unclear if present on admission but continue wound care.   Goals of care Plan for skilled nursing facility for rehabitation.  TOC on board.  DVT prophylaxis: SCDs Start: 10/03/23 2153   Disposition: Awaiting for disposition likely to skilled nursing facility..  Medically stable for disposition.  Status is: Inpatient  Remains inpatient appropriate because: , need for skilled nursing facility placement    Code Status:     Code Status: Full Code  Family Communication: Spoke with the patient's son [name] on the phone on 11/22/2023  Consultants: GI Interventional radiology Palliative care General surgery Nephrology  Procedures: Paracentesis on 11/19/2023  Anti-infectives:  None currently   Subjective: Today, patient was seen and examined at bedside.  Feels secondary to being in the hospital.  Denies  any nausea vomiting abdominal pain.  Has had bowel movement twice this morning.  Spoke about fluid restriction dietary modification.  Feels anxious about her diagnosis.  Objective: Vitals:   11/27/23 0500 11/27/23 0818  BP: (!) 106/58 100/67  Pulse: (!) 104 96  Resp: 18 15  Temp: 99 F (37.2 C) 98.1 F (36.7 C)  SpO2: 98% 99%    Intake/Output Summary (Last 24 hours) at 11/27/2023 0917 Last data filed at 11/27/2023 0500 Gross per 24 hour  Intake 1200 ml  Output 0 ml  Net 1200 ml   Filed Weights   11/16/23 0500 11/19/23 0500 11/23/23 0500  Weight: 58.6 kg 56.1 kg 58.1 kg   Body mass index is 23.43 kg/m.   Physical Exam:  General: Thinly built, not in obvious distress, appears older than stated age HENT:   No scleral pallor or icterus noted. Oral mucosa is moist.  Chest: Diminished breath sounds bilaterally.  CVS: S1 &S2 heard. No murmur.  Regular rate and rhythm. Abdomen: Soft, nontender, mildly distended abdomen with ascites.  Bowel sounds are heard.   Extremities: No cyanosis, clubbing with bilateral lower extremity pitting edema.  Some ankle tenderness on the right. Psych: Alert, awake and oriented, slightly anxious mood CNS:  No cranial nerve deficits.  Power equal in all extremities.  Moves all extremities Skin: Warm and dry.  No rashes noted.  Stage II sacral ulceration    Data Review: I have personally reviewed the following laboratory data and studies,  CBC: Recent Labs  Lab 11/22/23 0851 11/23/23 0523 11/24/23 1534 11/26/23 0227  WBC 6.8 6.6 5.4  4.8  HGB 8.9* 8.1* 8.2* 8.5*  HCT 25.5* 23.0* 24.2* 24.5*  MCV 96.6 96.2 98.0 97.6  PLT 119* 118* 118* 98*   Basic Metabolic Panel: Recent Labs  Lab 11/20/23 1715 11/22/23 0851 11/23/23 0523 11/24/23 1534 11/26/23 0227  NA 128* 125* 128* 128* 124*  K 3.6 2.7* 3.0* 3.8 4.5  CL 90* 89* 93* 98 95*  CO2 25 23 24  21* 20*  GLUCOSE 112* 105* 83 101* 97  BUN 25* 22* 14 16 20   CREATININE 0.54 0.65 0.69 0.55  0.55  CALCIUM  8.7* 8.5* 8.2* 8.2* 8.1*  MG  --  1.4* 1.5* 1.6* 1.8  PHOS  --   --  3.2  --   --    Liver Function Tests: Recent Labs  Lab 11/22/23 0851 11/23/23 0523  AST 65* 64*  ALT 24 22  ALKPHOS 57 53  BILITOT 2.2* 2.2*  PROT 7.4 7.0  ALBUMIN  2.7* 2.4*   No results for input(s): LIPASE, AMYLASE in the last 168 hours. No results for input(s): AMMONIA in the last 168 hours. Cardiac Enzymes: No results for input(s): CKTOTAL, CKMB, CKMBINDEX, TROPONINI in the last 168 hours. BNP (last 3 results) No results for input(s): BNP in the last 8760 hours.  ProBNP (last 3 results) No results for input(s): PROBNP in the last 8760 hours.  CBG: No results for input(s): GLUCAP in the last 168 hours. Recent Results (from the past 240 hours)  Urine Culture (for pregnant, neutropenic or urologic patients or patients with an indwelling urinary catheter)     Status: Abnormal   Collection Time: 11/19/23 10:38 AM   Specimen: Urine, Random  Result Value Ref Range Status   Specimen Description URINE, RANDOM  Final   Special Requests   Final    NONE Performed at [facility], [address], [place], [name] [address]    Culture >=100,000 COLONIES/mL ENTEROBACTER CLOACAE (A)  Final   Report Status 11/21/2023 FINAL  Final   Organism ID, Bacteria ENTEROBACTER CLOACAE (A)  Final      Susceptibility   Enterobacter cloacae - MIC*    CEFEPIME <=0.12 SENSITIVE Sensitive     ERTAPENEM <=0.12 SENSITIVE Sensitive     CIPROFLOXACIN  <=0.06 SENSITIVE Sensitive     GENTAMICIN <=1 SENSITIVE Sensitive     NITROFURANTOIN 64 INTERMEDIATE Intermediate     TRIMETH/SULFA <=20 SENSITIVE Sensitive     PIP/TAZO Value in next row Sensitive      <=4 SENSITIVEThis is a modified FDA-approved test that has been validated and its performance characteristics determined by the reporting laboratory.  This laboratory is certified under the Clinical Laboratory Improvement Amendments CLIA as  qualified to perform high complexity clinical laboratory testing.    MEROPENEM Value in next row Sensitive      <=4 SENSITIVEThis is a modified FDA-approved test that has been validated and its performance characteristics determined by the reporting laboratory.  This laboratory is certified under the Clinical Laboratory Improvement Amendments CLIA as qualified to perform high complexity clinical laboratory testing.    * >=100,000 COLONIES/mL ENTEROBACTER CLOACAE     Studies: No results found.     Lily Kernen, MD  [facility] 11/27/2023  If 7PM-7AM, please contact night-coverage

## 2023-11-27 NOTE — Progress Notes (Signed)
 Pt c/o anxiety. Opyd, MD notified.

## 2023-11-27 NOTE — Plan of Care (Signed)
   Problem: Health Behavior/Discharge Planning: Goal: Ability to manage health-related needs will improve Outcome: Progressing   Problem: Clinical Measurements: Goal: Will remain free from infection Outcome: Progressing   Problem: Clinical Measurements: Goal: Respiratory complications will improve Outcome: Progressing

## 2023-11-27 NOTE — Progress Notes (Signed)
 Occupational Therapy Treatment Patient Details Name: Joan Wood MRN: [id number] DOB: [birth year] Today's Date: 11/27/2023   History of present illness 53 y.o. female presents to [facility] hospital on 10/03/2023 with nausea/vomiting and bloody diarrhea. Admitted with decompensated alcoholic cirrhosis w/ ascites and hepatic encephalopathy. 9/25 Upper EGD concerning for possible acute esophageal necrosis, portal hypertension, non-bleeding gastric and duodenal ulcers. Pt also underwent paracentesis on 9/25, 10/6, 10/22. PMH includes decompensated cirrhosis, PUD, GERD, pancreatitis, OSA.   OT comments  Pt received in hall asking for cafeteria phone number with scrub top on with no pants. Pt redirected back to room with menu provided. Once in room, needing cues to recall what she was doping. Provided paper scrub pants and pt donning with supervision. Re-oriented to call bell use, left EOB with bed alarm on and all needs met.       If plan is discharge home, recommend the following:  A little help with walking and/or transfers;A little help with bathing/dressing/bathroom;Direct supervision/assist for medications management;Direct supervision/assist for financial management;Assist for transportation;Supervision due to cognitive status   Equipment Recommendations  BSC/3in1    Recommendations for Other Services      Precautions / Restrictions Precautions Precautions: Fall Recall of Precautions/Restrictions: Impaired Precaution/Restrictions Comments: history of orthostasis Restrictions Weight Bearing Restrictions Per Provider Order: No       Mobility Bed Mobility               General bed mobility comments: received in hall and left EOB with bed alarm on    Transfers Overall transfer level: Needs assistance Equipment used: Rolling walker (2 wheels) Transfers: Sit to/from Stand Sit to Stand: Supervision                 Balance Overall balance assessment: Needs  assistance Sitting-balance support: No upper extremity supported, Feet supported Sitting balance-Leahy Scale: Good     Standing balance support: Bilateral upper extremity supported, Reliant on assistive device for balance, During functional activity Standing balance-Leahy Scale: Fair                             ADL either performed or assessed with clinical judgement   ADL Overall ADL's : Needs assistance/impaired                     Lower Body Dressing: Supervision/safety;Sit to/from stand Lower Body Dressing Details (indicate cue type and reason): donning paper scrub pants             Functional mobility during ADLs: Supervision/safety      Extremity/Trunk Assessment Upper Extremity Assessment Upper Extremity Assessment: Generalized weakness   Lower Extremity Assessment Lower Extremity Assessment: Defer to PT evaluation        Vision   Vision Assessment?: Wears glasses for reading;Wears glasses for driving Additional Comments: donned glasses to read phone number from menu   Perception     Praxis     Communication Communication Communication: No apparent difficulties Factors Affecting Communication:  (verbose)   Cognition Arousal: Alert Behavior During Therapy: Impulsive Cognition: Cognition impaired   Orientation impairments: Time Awareness: Intellectual awareness impaired Memory impairment (select all impairments): Short-term memory, Working memory Attention impairment (select first level of impairment): Sustained attention Executive functioning impairment (select all impairments): Organization, Reasoning, Problem solving, Initiation OT - Cognition Comments: walking out into hall with only shirt on asking for phone number to cafeteria. Provided menu. once in room, asking what she was about to  do, thus with STM. able to call with a reminder. When asked why entering hall without pants on and why call bell was not used, pt states its the  nurses fault she does not have pants on as she was not given pants. provided pants and re-oriented to call bell                 Following commands: Impaired Following commands impaired: Follows multi-step commands inconsistently      Cueing   Cueing Techniques: Verbal cues, Visual cues  Exercises      Shoulder Instructions       General Comments edematous BLE (+2 pitting edema distal>proximal),    Pertinent Vitals/ Pain       Pain Assessment Pain Assessment: No/denies pain  Home Living                                          Prior Functioning/Environment              Frequency  Min 2X/week        Progress Toward Goals  OT Goals(current goals can now be found in the care plan section)  Progress towards OT goals: Progressing toward goals  Acute Rehab OT Goals OT Goal Formulation: With patient Time For Goal Achievement: 12/05/23 Potential to Achieve Goals: Fair ADL Goals Pt Will Perform Grooming: with supervision;standing Pt Will Perform Lower Body Dressing: with supervision;sitting/lateral leans;sit to/from stand;with adaptive equipment Pt Will Transfer to Toilet: with supervision;ambulating;regular height toilet;grab bars Pt Will Perform Toileting - Clothing Manipulation and hygiene: with supervision;sitting/lateral leans;sit to/from stand Pt/caregiver will Perform Home Exercise Program: Both right and left upper extremity;With written HEP provided;With theraband  Plan      Co-evaluation                 AM-PAC OT 6 Clicks Daily Activity     Outcome Measure   Help from another person eating meals?: None Help from another person taking care of personal grooming?: A Little Help from another person toileting, which includes using toliet, bedpan, or urinal?: A Lot Help from another person bathing (including washing, rinsing, drying)?: A Lot Help from another person to put on and taking off regular upper body clothing?: A  Little Help from another person to put on and taking off regular lower body clothing?: A Lot 6 Click Score: 16    End of Session Equipment Utilized During Treatment: Rolling walker (2 wheels)  OT Visit Diagnosis: Other abnormalities of gait and mobility (R26.89);Muscle weakness (generalized) (M62.81);Pain;Other symptoms and signs involving cognitive function Pain - Right/Left: Right   Activity Tolerance Patient tolerated treatment well   Patient Left in bed;Other (comment)   Nurse Communication Mobility status        Time: 9180-9170 OT Time Calculation (min): 10 min  Charges: OT General Charges $OT Visit: 1 Visit OT Treatments $Self Care/Home Management : 8-22 mins  Elma JONETTA Lebron FREDERICK, OTR/L [facility] Acute Rehabilitation Office: [phone]   Elma JONETTA Lebron 11/27/2023, 8:50 AM

## 2023-11-27 NOTE — TOC Progression Note (Signed)
 Transition of Care [facility]) - Progression Note    Patient Details  Name: Joan Wood MRN: [id number] Date of Birth: [birth year]  [facility] CM/SW Contact  [name], [name] Phone Number: 11/27/2023, 3:43 PM  Clinical Narrative:   CSW working to find placement for the patient: - [facility]: Left a voicemail for [name] - [facility]: No answer, will try again - [facility]: Left a engineer, technical sales for [employer] in Admissions - [facility]: Spoke with Jon [phone]), they have no beds, but she suggested [facility], [facility], and [facility] in [place] - [facility] [employer]: Left a engineer, technical sales for [facility] [employer] [facility]: No answer, will try again [facility]: No answer, will try again - [facility]: Left a engineer, technical sales for [employer] in Admissions - [facility]: Spoke with Clarita, no beds available at this time  CSW to follow    Expected Discharge Plan: Skilled Nursing Facility Barriers to Discharge: Homeless with medical needs, Inadequate or no insurance, Continued Medical Work up, Unsafe home situation               Expected Discharge Plan and Services In-house Referral: Clinical Social Work Discharge Planning Services: CM Consult Post Acute Care Choice: Home Health Living arrangements for the past 2 months: Single Family Home                 DME Arranged: Walker rolling   Date DME Agency Contacted: 10/08/23   Representative spoke with at DME Agency: [name]             Social Drivers of Health (SDOH) Interventions SDOH Screenings   Food Insecurity: No Food Insecurity (10/03/2023)  Housing: Low Risk  (10/03/2023)  Transportation Needs: No Transportation Needs (10/03/2023)  Recent Concern: Transportation Needs - Unmet Transportation Needs (08/20/2023)  Utilities: Not At Risk (10/03/2023)  Depression (PHQ2-9): High Risk (07/30/2023)  Social Connections: Socially Isolated (10/03/2023)  Tobacco Use:  High Risk (10/04/2023)    Readmission Risk Interventions    10/04/2023    9:02 AM 09/03/2023   12:31 PM 09/02/2023    9:01 AM  Readmission Risk Prevention Plan  Transportation Screening Complete Complete Complete  PCP or Specialist Appt within 3-5 Days   Complete  HRI or Home Care Consult   Complete  Social Work Consult for Recovery Care Planning/Counseling   Complete  Palliative Care Screening   Not Applicable  Medication Review Oceanographer) Complete Complete Complete  HRI or Home Care Consult Complete Complete   SW Recovery Care/Counseling Consult Complete Complete   Palliative Care Screening Not Applicable Not Applicable   Skilled Nursing Facility Not Applicable Patient Refused

## 2023-11-28 DIAGNOSIS — K7460 Unspecified cirrhosis of liver: Secondary | ICD-10-CM | POA: Diagnosis not present

## 2023-11-28 DIAGNOSIS — K7290 Hepatic failure, unspecified without coma: Secondary | ICD-10-CM | POA: Diagnosis not present

## 2023-11-28 LAB — COMPREHENSIVE METABOLIC PANEL WITH GFR
ALT: 28 U/L (ref 0–44)
AST: 73 U/L — ABNORMAL HIGH (ref 15–41)
Albumin: 2.2 g/dL — ABNORMAL LOW (ref 3.5–5.0)
Alkaline Phosphatase: 58 U/L (ref 38–126)
Anion gap: 11 (ref 5–15)
BUN: 23 mg/dL — ABNORMAL HIGH (ref 6–20)
CO2: 24 mmol/L (ref 22–32)
Calcium: 8.1 mg/dL — ABNORMAL LOW (ref 8.9–10.3)
Chloride: 96 mmol/L — ABNORMAL LOW (ref 98–111)
Creatinine, Ser: 0.58 mg/dL (ref 0.44–1.00)
GFR, Estimated: >60 mL/min (ref >60)
Glucose, Bld: 102 mg/dL — ABNORMAL HIGH (ref 70–99)
Potassium: 2.8 mmol/L — ABNORMAL LOW (ref 3.5–5.1)
Sodium: 131 mmol/L — ABNORMAL LOW (ref 135–145)
Total Bilirubin: 1.6 mg/dL — ABNORMAL HIGH (ref 0.0–1.2)
Total Protein: 6.6 g/dL (ref 6.5–8.1)

## 2023-11-28 LAB — CBC
HCT: 24.1 % — ABNORMAL LOW (ref 36.0–46.0)
Hemoglobin: 8.4 g/dL — ABNORMAL LOW (ref 12.0–15.0)
MCH: 33.7 pg (ref 26.0–34.0)
MCHC: 34.9 g/dL (ref 30.0–36.0)
MCV: 96.8 fL (ref 80.0–100.0)
Platelets: 129 K/uL — ABNORMAL LOW (ref 150–400)
RBC: 2.49 MIL/uL — ABNORMAL LOW (ref 3.87–5.11)
RDW: 17.4 % — ABNORMAL HIGH (ref 11.5–15.5)
WBC: 3.7 K/uL — ABNORMAL LOW (ref 4.0–10.5)
nRBC: 0 % (ref 0.0–0.2)

## 2023-11-28 LAB — PHOSPHORUS: Phosphorus: 3.9 mg/dL (ref 2.5–4.6)

## 2023-11-28 LAB — MAGNESIUM: Magnesium: 1.4 mg/dL — ABNORMAL LOW (ref 1.7–2.4)

## 2023-11-28 MED ORDER — POTASSIUM CHLORIDE 10 MEQ/100ML IV SOLN
10.0000 meq | INTRAVENOUS | Status: AC
  Administered 2023-11-28 (×4): 10 meq via INTRAVENOUS
  Filled 2023-11-28 (×4): qty 100

## 2023-11-28 MED ORDER — MAGNESIUM SULFATE 4 GM/100ML IV SOLN
4.0000 g | Freq: Once | INTRAVENOUS | Status: AC
  Administered 2023-11-28: 4 g via INTRAVENOUS
  Filled 2023-11-28: qty 100

## 2023-11-28 MED ORDER — POTASSIUM CHLORIDE CRYS ER 20 MEQ PO TBCR
40.0000 meq | EXTENDED_RELEASE_TABLET | Freq: Two times a day (BID) | ORAL | Status: AC
  Administered 2023-11-28 (×2): 40 meq via ORAL
  Filled 2023-11-28 (×2): qty 2

## 2023-11-28 NOTE — Plan of Care (Signed)
  Problem: Clinical Measurements: Goal: Ability to maintain clinical measurements within normal limits will improve Outcome: Progressing   Problem: Clinical Measurements: Goal: Respiratory complications will improve Outcome: Progressing   Problem: Activity: Goal: Risk for activity intolerance will decrease Outcome: Progressing   

## 2023-11-28 NOTE — Progress Notes (Signed)
 PROGRESS NOTE  Joan Wood FMW:[id number] DOB: [birth year] DOA: 10/03/2023 PCP: Edman Meade PEDLAR, FNP   LOS: 56 days   Brief narrative:  53 y.o. female with a history of decompensated liver cirrhosis, PUD, gastric ulcer perforation status post ex lap, GERD, pancreatitis, obstructive sleep apnea. Patient presented secondary to nausea, coffee-ground emesis, bloody diarrhea with concern for upper GI bleed. 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 Hospitalization complicated by hepatic encephalopathy treated with lactulose and rifaximin followed by lactulose alone. Patient also requiring recurrent paracenteses for her recurrent ascites.  Currently patient is awaiting for placement.  Assessment/Plan: Principal Problem:   Decompensated cirrhosis (HCC) Active Problems:   GI bleed   Liver cirrhosis (HCC)   Esophageal necrosis   Acute appendicitis   Goals of care, counseling/discussion   Alcohol use disorder   Hyponatremia   Tobacco abuse   Nausea vomiting and diarrhea   Acute on chronic blood loss anemia   ABLA (acute blood loss anemia)   AKI (acute kidney injury)   Pressure injury of skin   Protein-calorie malnutrition, severe   Decompensated alcoholic cirrhosis with recurrent ascites Underwent therapeutic paracentesis on 11/12/2023 with removal of 750 cc of ascitic fluid.  Ascitic fluid was not consistent with SBP.  Received IV Rocephin  for UTI.  Patient was initially treated with rifaximin and lactulose for encephalopathy.  At this time, only on lactulose has been continued since rifaximin will not be covered at the outpatient facility.  Patient underwent  ultrasound-guided paracentesis 11/24/2023 again with removal of 3.6 L of fluid.  Likely will need repeat paracentesis prior to discharge since patient is starting to be distended.  I  did speak with interventional radiology about potential peritoneal drain placement since she had it in the past but given her current circumstances and need for follow-up and management of drains, it is unlikely that patient will be able to do unless she is at skilled nursing facility.  Patient might benefit from peritoneal drain if she were to go to a skilled nursing facility to reduce frequent tapping subsequently.   Hepatic encephalopathy Resolved.  Continue lactulose to keep bowel movements 3-4 times a day.   Enterobacter UTI. Treated with Bactrim DS twice daily and has completed the course  Hypokalemia Initially improved after replacement.  Latest potassium of 2.8 again today.  Will aggressively replenish with IV and orally.  Check levels in AM.  Hypomagnesemia.  Replenished and again low at 1.4.  Will aggressively replenish with IV magnesium  sulfate 4 g with  Hypoalbuminemia Secondary to underlying liver disease.  Latest albumin  of 2.4.  Causing peripheral edema and recurrent ascites.   Thrombocytopenia Secondary to liver disease.  Latest CBC showed hemoglobin of 8.4 and Platelet 129 from 98   Acute kidney injury Presumed secondary to ATN from contrast. Nephrology was consulted and renal function has improved also.  Latest creatinine of 0.5   Acute appendicitis General surgery was consulted and patient was thought to be high surgical risk so was treated conservatively with antibiotics.  No further abdominal pain.  Chronic hypotension Has received albumin  intermittently.  Currently on midodrine  20 milligram 3 times daily.  Latest blood pressure 94/64.    Chronic hyponatremia Patient's baseline appears to be around 125 -128.  Latest sodium of 131.  Nephrology also saw the patient during hospitalization.   Acute blood loss anemia secondary to acute upper GI bleed from  esophagitis and esophageal necrosis Patient was seen by GI during hospitalization and underwent upper GI endoscopy  on 10/04/2023 with findings of grade D esophagitis in addition to concern for acute esophageal necrosis. Non-bleeding gastric ulcer noted GI recommended Carafate  for 2 weeks and Protonix  twice daily with outpatient GI follow-up.  Patient received 4 units of packed RBC during this hospitalization.   Latest hemoglobin of 8.4 and has remained stable.  Will transfuse if necessary.    Lower extremity edema, right ankle tenderness. Peripheral neuropathy Secondary to hypoalbuminemia from liver disease.  Duplex ultrasound of the lower extremity negative for DVT.   x-ray of the right ankle without any bony issues.  Gabapentin  dose has been increased from 300 milligram to 600 mg 3 times daily for the last few days and has been tolerating..  Will need to closely monitor for sedation..  Continue Lasix 40 mg twice daily.  Slightly elevated uric acid level.  Received colchicine 0.6 mg daily for few days.  Will hold off with colchicine   Tobacco abuse Counseling done   History of alcohol abuse No active issues.  Counseling done.  Continue multivitamin and thiamine .  No signs of withdrawal.   Severe protein malnutrition Nutrition Status: Body mass index is 22.62 kg/m.  Nutrition Problem: Severe Malnutrition Etiology: chronic illness (cirrhosis) Signs/Symptoms: severe fat depletion, severe muscle depletion Interventions: Ensure Enlive (each supplement provides 350kcal and 20 grams of protein), MVI, Liberalize Diet, Magic cup Continue nutritional supplement.  Continue multivitamin thiamine  and folic acid .   Pressure injury sacrum stage II pressure Wound 10/15/23 0900 Pressure Injury Sacrum Mid Stage 2 -  Partial thickness loss of dermis presenting as a shallow open injury with a red, pink wound bed without slough. (Active)  Unclear if present on admission but continue wound care.   Goals of care Plan for skilled nursing facility for rehabitation.  TOC on board.  DVT prophylaxis: SCDs Start: 10/03/23  2153   Disposition: Awaiting for disposition likely to skilled nursing facility  Status is: Inpatient  Remains inpatient appropriate because: , need for skilled nursing facility placement    Code Status:     Code Status: Full Code  Family Communication: Spoke with the patient's son [name] on the phone on 11/22/2023  Consultants: GI Interventional radiology Palliative care General surgery Nephrology  Procedures: Paracentesis on 11/19/2023  Anti-infectives:  None currently   Subjective: Today, patient was seen and examined at bedside.  Feels distended in her abdomen but no tenderness.  No fever chills nausea vomiting.  Leg swelling on and off with mild pain.  Spoke about peritoneal drain which patient agrees but might not be a good candidate for long-term management  Objective: Vitals:   11/28/23 0342 11/28/23 0827  BP: 116/73 94/64  Pulse: (!) 101 93  Resp: 18 16  Temp: 98.3 F (36.8 C) 98.2 F (36.8 C)  SpO2: 100% 100%    Intake/Output Summary (Last 24 hours) at 11/28/2023 1131 Last data filed at 11/27/2023 2100 Gross per 24 hour  Intake 960 ml  Output --  Net 960 ml   Filed Weights   11/19/23 0500 11/23/23 0500 11/27/23 1118  Weight: 56.1 kg 58.1 kg 55.9 kg   Body mass index is 22.55 kg/m.   Physical Exam:  General: Thinly built, not in obvious distress, appears older than stated age, mildly anxious, HENT:   No scleral pallor or icterus noted. Oral mucosa is moist.  Chest: Diminished breath sounds bilaterally.   CVS: S1 &S2 heard. No  murmur.  Regular rate and rhythm. Abdomen: Soft, nontender, mildly distended abdomen with ascites.  Bowel sounds are heard.   Extremities: No cyanosis, clubbing with bilateral lower extremity pitting edema.  Some ankle tenderness on the right. Psych: Alert, awake and oriented, slightly anxious mood CNS:  No cranial nerve deficits.  Power equal in all extremities.  Moves all extremities Skin: Warm and dry.  No rashes  noted.  Stage II sacral ulceration   Data Review: I have personally reviewed the following laboratory data and studies,  CBC: Recent Labs  Lab 11/22/23 0851 11/23/23 0523 11/24/23 1534 11/26/23 0227 11/28/23 0419  WBC 6.8 6.6 5.4 4.8 3.7*  HGB 8.9* 8.1* 8.2* 8.5* 8.4*  HCT 25.5* 23.0* 24.2* 24.5* 24.1*  MCV 96.6 96.2 98.0 97.6 96.8  PLT 119* 118* 118* 98* 129*   Basic Metabolic Panel: Recent Labs  Lab 11/22/23 0851 11/23/23 0523 11/24/23 1534 11/26/23 0227 11/28/23 0419  NA 125* 128* 128* 124* 131*  K 2.7* 3.0* 3.8 4.5 2.8*  CL 89* 93* 98 95* 96*  CO2 23 24 21* 20* 24  GLUCOSE 105* 83 101* 97 102*  BUN 22* 14 16 20  23*  CREATININE 0.65 0.69 0.55 0.55 0.58  CALCIUM  8.5* 8.2* 8.2* 8.1* 8.1*  MG 1.4* 1.5* 1.6* 1.8 1.4*  PHOS  --  3.2  --   --  3.9   Liver Function Tests: Recent Labs  Lab 11/22/23 0851 11/23/23 0523 11/28/23 0419  AST 65* 64* 73*  ALT 24 22 28   ALKPHOS 57 53 58  BILITOT 2.2* 2.2* 1.6*  PROT 7.4 7.0 6.6  ALBUMIN  2.7* 2.4* 2.2*   No results for input(s): LIPASE, AMYLASE in the last 168 hours. No results for input(s): AMMONIA in the last 168 hours. Cardiac Enzymes: No results for input(s): CKTOTAL, CKMB, CKMBINDEX, TROPONINI in the last 168 hours. BNP (last 3 results) No results for input(s): BNP in the last 8760 hours.  ProBNP (last 3 results) No results for input(s): PROBNP in the last 8760 hours.  CBG: No results for input(s): GLUCAP in the last 168 hours. Recent Results (from the past 240 hours)  Urine Culture (for pregnant, neutropenic or urologic patients or patients with an indwelling urinary catheter)     Status: Abnormal   Collection Time: 11/19/23 10:38 AM   Specimen: Urine, Random  Result Value Ref Range Status   Specimen Description URINE, RANDOM  Final   Special Requests   Final    NONE Performed at [facility], [address] [place], [name] [address]    Culture >=100,000 COLONIES/mL  ENTEROBACTER CLOACAE (A)  Final   Report Status 11/21/2023 FINAL  Final   Organism ID, Bacteria ENTEROBACTER CLOACAE (A)  Final      Susceptibility   Enterobacter cloacae - MIC*    CEFEPIME <=0.12 SENSITIVE Sensitive     ERTAPENEM <=0.12 SENSITIVE Sensitive     CIPROFLOXACIN  <=0.06 SENSITIVE Sensitive     GENTAMICIN <=1 SENSITIVE Sensitive     NITROFURANTOIN 64 INTERMEDIATE Intermediate     TRIMETH/SULFA <=20 SENSITIVE Sensitive     PIP/TAZO Value in next row Sensitive      <=4 SENSITIVEThis is a modified FDA-approved test that has been validated and its performance characteristics determined by the reporting laboratory.  This laboratory is certified under the Clinical Laboratory Improvement Amendments CLIA as qualified to perform high complexity clinical laboratory testing.    MEROPENEM Value in next row Sensitive      <=4 SENSITIVEThis  is a modified FDA-approved test that has been validated and its performance characteristics determined by the reporting laboratory.  This laboratory is certified under the Clinical Laboratory Improvement Amendments CLIA as qualified to perform high complexity clinical laboratory testing.    * >=100,000 COLONIES/mL ENTEROBACTER CLOACAE     Studies: No results found.     Vernal Alstrom, MD  [facility] 11/28/2023  If 7PM-7AM, please contact night-coverage

## 2023-11-28 NOTE — Progress Notes (Signed)
 Physical Therapy Treatment Patient Details Name: Joan Wood MRN: [id number] DOB: August 06, 1970 Today's Date: 11/28/2023   History of Present Illness 53 y.o. female presents to [facility] hospital on 10/03/2023 with nausea/vomiting and bloody diarrhea. Admitted with decompensated alcoholic cirrhosis w/ ascites and hepatic encephalopathy. 9/25 Upper EGD concerning for possible acute esophageal necrosis, portal hypertension, non-bleeding gastric and duodenal ulcers. Pt also underwent paracentesis on 9/25, 10/6, 10/22. PMH includes decompensated cirrhosis, PUD, GERD, pancreatitis, OSA.    PT Comments  Pt received sitting EOB and agreeable to session. Pt able to tolerate gait trials with up to CGA for safety without AD. Pt requires cues for safety and attention throughout session. Pt able to participate in balance challenge with cone taps. Pt requires min A for balance without UE support due to difficulty with single leg stance. Activity tolerance limited by back and B feet pain. Pt continues to benefit from PT services to progress toward functional mobility goals.     If plan is discharge home, recommend the following: A lot of help with walking and/or transfers;A lot of help with bathing/dressing/bathroom;Assistance with cooking/housework;Assist for transportation;Help with stairs or ramp for entrance;Direct supervision/assist for medications management;Direct supervision/assist for financial management;Supervision due to cognitive status   Can travel by private vehicle     Yes  Equipment Recommendations  Rolling walker (2 wheels);BSC/3in1    Recommendations for Other Services       Precautions / Restrictions Precautions Precautions: Fall Recall of Precautions/Restrictions: Impaired Precaution/Restrictions Comments: history of orthostasis Restrictions Weight Bearing Restrictions Per Provider Order: No     Mobility  Bed Mobility Overal bed mobility: Needs Assistance Bed Mobility: Sit to Supine        Sit to supine: Contact guard assist   General bed mobility comments: increased time and effort for BLE elevation to EOB    Transfers Overall transfer level: Needs assistance Equipment used: None Transfers: Sit to/from Stand Sit to Stand: Supervision                Ambulation/Gait Ambulation/Gait assistance: Contact guard assist, Supervision Gait Distance (Feet): 125 Feet (x2) Assistive device: Rolling walker (2 wheels), None Gait Pattern/deviations: Step-through pattern, Decreased stride length, Drifts right/left, Trunk flexed Gait velocity: decr     General Gait Details: Light support on RW support. CGA without AD and supervision with RW. Cues for upright posture and safety   Stairs             Wheelchair Mobility     Tilt Bed    Modified Rankin (Stroke Patients Only)       Balance Overall balance assessment: Needs assistance Sitting-balance support: No upper extremity supported, Feet supported Sitting balance-Leahy Scale: Good Sitting balance - Comments: seated EOB   Standing balance support: Bilateral upper extremity supported, Reliant on assistive device for balance, During functional activity, No upper extremity supported Standing balance-Leahy Scale: Fair Standing balance comment: with and without AD with no overt LOB                            Communication Communication Communication: No apparent difficulties  Cognition Arousal: Alert Behavior During Therapy: Impulsive   PT - Cognitive impairments: Awareness, Problem solving, Safety/Judgement                         Following commands: Impaired Following commands impaired: Follows multi-step commands inconsistently    Cueing Cueing Techniques: Verbal cues, Visual cues  Exercises Other Exercises Other Exercises: standing BLE cone taps to 4 cones. Pt able to tap up to 3 consecutive colors accurately with min A for balance    General Comments         Pertinent Vitals/Pain Pain Assessment Pain Assessment: Faces Faces Pain Scale: Hurts a little bit Pain Location: back, B feet Pain Descriptors / Indicators: Grimacing, Discomfort Pain Intervention(s): Monitored during session, Limited activity within patient's tolerance, Repositioned     PT Goals (current goals can now be found in the care plan section) Acute Rehab PT Goals Patient Stated Goal: to return to independence PT Goal Formulation: With patient Time For Goal Achievement: 11/29/23 Progress towards PT goals: Progressing toward goals    Frequency    Min 2X/week          AM-PAC PT 6 Clicks Mobility   Outcome Measure  Help needed turning from your back to your side while in a flat bed without using bedrails?: A Little Help needed moving from lying on your back to sitting on the side of a flat bed without using bedrails?: A Little Help needed moving to and from a bed to a chair (including a wheelchair)?: A Little Help needed standing up from a chair using your arms (e.g., wheelchair or bedside chair)?: A Little Help needed to walk in hospital room?: A Little Help needed climbing 3-5 steps with a railing? : A Little 6 Click Score: 18    End of Session Equipment Utilized During Treatment: Gait belt Activity Tolerance: Patient tolerated treatment well Patient left: in bed;with call bell/phone within reach;with bed alarm set Nurse Communication: Mobility status PT Visit Diagnosis: Other abnormalities of gait and mobility (R26.89);Muscle weakness (generalized) (M62.81);Unsteadiness on feet (R26.81);Difficulty in walking, not elsewhere classified (R26.2)     Time: 9143-9069 PT Time Calculation (min) (ACUTE ONLY): 34 min  Charges:    $Gait Training: 8-22 mins $Neuromuscular Re-education: 8-22 mins PT General Charges $$ ACUTE PT VISIT: 1 Visit                    Darryle George, PTA Acute Rehabilitation Services Secure Chat Preferred  [phone]     Darryle George 11/28/2023, 10:24 AM

## 2023-11-29 ENCOUNTER — Ambulatory Visit: Admitting: Cardiology

## 2023-11-29 DIAGNOSIS — K7290 Hepatic failure, unspecified without coma: Secondary | ICD-10-CM | POA: Diagnosis not present

## 2023-11-29 DIAGNOSIS — K7460 Unspecified cirrhosis of liver: Secondary | ICD-10-CM | POA: Diagnosis not present

## 2023-11-29 LAB — CBC
HCT: 26.2 % — ABNORMAL LOW (ref 36.0–46.0)
Hemoglobin: 9 g/dL — ABNORMAL LOW (ref 12.0–15.0)
MCH: 33.8 pg (ref 26.0–34.0)
MCHC: 34.4 g/dL (ref 30.0–36.0)
MCV: 98.5 fL (ref 80.0–100.0)
Platelets: 134 K/uL — ABNORMAL LOW (ref 150–400)
RBC: 2.66 MIL/uL — ABNORMAL LOW (ref 3.87–5.11)
RDW: 17.5 % — ABNORMAL HIGH (ref 11.5–15.5)
WBC: 5 K/uL (ref 4.0–10.5)
nRBC: 0 % (ref 0.0–0.2)

## 2023-11-29 LAB — BASIC METABOLIC PANEL WITH GFR
Anion gap: 12 (ref 5–15)
BUN: 16 mg/dL (ref 6–20)
CO2: 22 mmol/L (ref 22–32)
Calcium: 8 mg/dL — ABNORMAL LOW (ref 8.9–10.3)
Chloride: 96 mmol/L — ABNORMAL LOW (ref 98–111)
Creatinine, Ser: 0.51 mg/dL (ref 0.44–1.00)
GFR, Estimated: >60 mL/min (ref >60)
Glucose, Bld: 90 mg/dL (ref 70–99)
Potassium: 3.1 mmol/L — ABNORMAL LOW (ref 3.5–5.1)
Sodium: 130 mmol/L — ABNORMAL LOW (ref 135–145)

## 2023-11-29 LAB — MAGNESIUM: Magnesium: 1.7 mg/dL (ref 1.7–2.4)

## 2023-11-29 LAB — PHOSPHORUS: Phosphorus: 3.9 mg/dL (ref 2.5–4.6)

## 2023-11-29 MED ORDER — MAGNESIUM SULFATE 2 GM/50ML IV SOLN
2.0000 g | Freq: Once | INTRAVENOUS | Status: AC
  Administered 2023-11-29: 2 g via INTRAVENOUS
  Filled 2023-11-29: qty 50

## 2023-11-29 MED ORDER — POTASSIUM CHLORIDE CRYS ER 20 MEQ PO TBCR
40.0000 meq | EXTENDED_RELEASE_TABLET | Freq: Two times a day (BID) | ORAL | Status: AC
  Administered 2023-11-29 (×2): 40 meq via ORAL
  Filled 2023-11-29 (×2): qty 2

## 2023-11-29 NOTE — Plan of Care (Signed)
  Problem: Health Behavior/Discharge Planning: Goal: Ability to manage health-related needs will improve Outcome: Not Progressing   Problem: Clinical Measurements: Goal: Ability to maintain clinical measurements within normal limits will improve Outcome: Not Progressing Goal: Will remain free from infection Outcome: Not Progressing Goal: Diagnostic test results will improve Outcome: Not Progressing Goal: Respiratory complications will improve Outcome: Not Progressing Goal: Cardiovascular complication will be avoided Outcome: Not Progressing   Problem: Activity: Goal: Risk for activity intolerance will decrease Outcome: Not Progressing   Problem: Nutrition: Goal: Adequate nutrition will be maintained Outcome: Not Progressing   Problem: Coping: Goal: Level of anxiety will decrease Outcome: Not Progressing   Problem: Pain Managment: Goal: General experience of comfort will improve and/or be controlled Outcome: Not Progressing   Problem: Safety: Goal: Ability to remain free from injury will improve Outcome: Not Progressing

## 2023-11-29 NOTE — Progress Notes (Signed)
 PROGRESS NOTE  Joan Wood FMW:[id number] DOB: 1970-05-19 DOA: 10/03/2023 PCP: Edman Meade PEDLAR, FNP   LOS: 57 days   Brief narrative:  53 y.o. female with a history of decompensated liver cirrhosis, PUD, gastric ulcer perforation status post ex lap, GERD, pancreatitis, obstructive sleep apnea. Patient presented secondary to nausea, coffee-ground emesis, bloody diarrhea with concern for upper GI bleed. 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 Hospitalization was complicated by hepatic encephalopathy treated with lactulose and rifaximin followed by lactulose alone. Patient also requiring recurrent paracenteses for her recurrent ascites.  Currently patient is awaiting for placement.  Assessment/Plan: Principal Problem:   Decompensated cirrhosis (HCC) Active Problems:   GI bleed   Liver cirrhosis (HCC)   Esophageal necrosis   Acute appendicitis   Goals of care, counseling/discussion   Alcohol use disorder   Hyponatremia   Tobacco abuse   Nausea vomiting and diarrhea   Acute on chronic blood loss anemia   ABLA (acute blood loss anemia)   AKI (acute kidney injury)   Pressure injury of skin   Protein-calorie malnutrition, severe   Decompensated alcoholic cirrhosis with recurrent ascites Underwent therapeutic paracentesis on 11/12/2023 with removal of 750 cc of ascitic fluid.  Ascitic fluid was not consistent with SBP.  Received IV Rocephin  for UTI.  Patient was initially treated with rifaximin and lactulose for encephalopathy.  At this time, only on lactulose has been continued since rifaximin will not be covered at the outpatient facility.  Patient underwent  ultrasound-guided paracentesis on 11/24/2023 again with removal of 3.6 L of fluid.  Likely will need repeat paracentesis prior to discharge since patient is starting to be  distended.  I did speak with interventional radiology about potential peritoneal drain placement since she had it in the past but given her current circumstances and need for follow-up and management of drains, it is unlikely that patient will be able to do unless she is at skilled nursing facility and somebody is there to manage her peritoneal drain.  Hepatic encephalopathy Improved.  Continue lactulose to keep bowel movements 3-4 times a day.   Enterobacter UTI. Treated with Bactrim DS twice daily and has completed the course  Hypokalemia Latest potassium of 3.1.  Will continue to replenish through orally.  Check levels in AM.   Hypomagnesemia.  Improved after replacement.  Will additionally give 2 g of IV magnesium  sulfate today.  Hypoalbuminemia Secondary to underlying liver disease.  Latest albumin  of 2.4.  Causing peripheral edema and recurrent ascites.   Thrombocytopenia Secondary to liver disease.  Latest CBC showed hemoglobin of 9.0 and stable and Platelet of 134 and improving.   Acute kidney injury Presumed secondary to ATN from contrast. Nephrology was consulted and renal function has improved also.  Latest creatinine of 0.5   Acute appendicitis General surgery was consulted and patient was thought to be high surgical risk so was treated conservatively with antibiotics.  No further abdominal pain.  Chronic hypotension Has received albumin  intermittently.  Currently on midodrine  20 milligram 3 times daily.  Latest blood pressure 94/64.    Chronic hyponatremia Patient's baseline appears to be around 125 -128.  Latest sodium of 130.  Nephrology also saw the patient during hospitalization.   Acute blood loss anemia secondary to acute upper GI bleed from esophagitis and esophageal necrosis Patient was seen by GI during hospitalization and underwent upper GI endoscopy on 10/04/2023 with  findings of grade D esophagitis in addition to concern for acute esophageal necrosis.  Non-bleeding gastric ulcer noted GI recommended Carafate  for 2 weeks and Protonix  twice daily with outpatient GI follow-up.  Patient received 4 units of packed RBC during this hospitalization.   Latest hemoglobin of 9.0 and has remained stable.  Will transfuse if necessary.    Lower extremity edema, right ankle tenderness. Peripheral neuropathy Secondary to hypoalbuminemia from liver disease.  Duplex ultrasound of the lower extremity negative for DVT.   x-ray of the right ankle without any bony issues.  Gabapentin  dose has been increased from 300 milligram to 600 mg 3 times daily for the last few days and has been tolerating..  Will need to closely monitor for sedation..  Continue Lasix  40 mg twice daily.  Slightly elevated uric acid level.  Received colchicine  0.6 mg daily for few days.  Will hold off with colchicine    Tobacco abuse Counseling done   History of alcohol  abuse No active issues.  Counseling done.  Continue multivitamin and thiamine .  No signs of withdrawal.   Severe protein malnutrition Nutrition Status: Body mass index is 22.62 kg/m.  Nutrition Problem: Severe Malnutrition Etiology: chronic illness (cirrhosis) Signs/Symptoms: severe fat depletion, severe muscle depletion Interventions: Ensure Enlive (each supplement provides 350kcal and 20 grams of protein), MVI, Liberalize Diet, Magic cup Continue nutritional supplement.  Continue multivitamin thiamine  and folic acid .   Pressure injury sacrum stage II pressure Wound 10/15/23 0900 Pressure Injury Sacrum Mid Stage 2 -  Partial thickness loss of dermis presenting as a shallow open injury with a red, pink wound bed without slough. (Active)  Unclear if present on admission but continue wound care.   Goals of care Plan for skilled nursing facility for rehabitation.  TOC on board.  DVT prophylaxis: SCDs Start: 10/03/23 2153   Disposition: Awaiting for disposition likely to skilled nursing facility  Status is:  Inpatient  Remains inpatient appropriate because: , need for skilled nursing facility placement    Code Status:     Code Status: Full Code  Family Communication: Spoke with the patient's son [name] on the phone on 11/22/2023  Consultants: GI Interventional radiology Palliative care General surgery Nephrology  Procedures: Paracentesis on 11/19/2023  Anti-infectives:  None currently   Subjective: Today, patient was seen and examined at bedside.  Patient feels a little distended in her abdomen and complains of mild ankle pain.  Denies any nausea vomiting fever chills or rigor.  Has had bowel movements 3-4 times a day.    Objective: Vitals:   11/29/23 0300 11/29/23 0806  BP: 125/75 109/73  Pulse: (!) 110 (!) 104  Resp: 18 17  Temp: 97.8 F (36.6 C) 98.9 F (37.2 C)  SpO2: 100% 100%    Intake/Output Summary (Last 24 hours) at 11/29/2023 0953 Last data filed at 11/29/2023 0200 Gross per 24 hour  Intake 880.56 ml  Output --  Net 880.56 ml   Filed Weights   11/19/23 0500 11/23/23 0500 11/27/23 1118  Weight: 56.1 kg 58.1 kg 55.9 kg   Body mass index is 22.55 kg/m.   Physical Exam:  General: Thinly built, not in obvious distress, appears older than stated age, mildly anxious, HENT:   No scleral pallor or icterus noted. Oral mucosa is moist.  Chest: Diminished breath sounds bilaterally.   CVS: S1 &S2 heard. No murmur.  Regular rate and rhythm. Abdomen: Soft, nontender, mildly distended abdomen with ascites.  Bowel sounds are heard.   Extremities: No cyanosis, clubbing  with bilateral lower extremity pitting edema.  Psych: Alert, awake and oriented, slightly anxious mood CNS:  No cranial nerve deficits.  Power equal in all extremities.  Moves all extremities Skin: Warm and dry.  No rashes noted.  Stage II sacral ulceration   Data Review: I have personally reviewed the following laboratory data and studies,  CBC: Recent Labs  Lab 11/23/23 0523 11/24/23 1534  11/26/23 0227 11/28/23 0419 11/29/23 0350  WBC 6.6 5.4 4.8 3.7* 5.0  HGB 8.1* 8.2* 8.5* 8.4* 9.0*  HCT 23.0* 24.2* 24.5* 24.1* 26.2*  MCV 96.2 98.0 97.6 96.8 98.5  PLT 118* 118* 98* 129* 134*   Basic Metabolic Panel: Recent Labs  Lab 11/23/23 0523 11/24/23 1534 11/26/23 0227 11/28/23 0419 11/29/23 0350  NA 128* 128* 124* 131* 130*  K 3.0* 3.8 4.5 2.8* 3.1*  CL 93* 98 95* 96* 96*  CO2 24 21* 20* 24 22  GLUCOSE 83 101* 97 102* 90  BUN 14 16 20  23* 16  CREATININE 0.69 0.55 0.55 0.58 0.51  CALCIUM  8.2* 8.2* 8.1* 8.1* 8.0*  MG 1.5* 1.6* 1.8 1.4* 1.7  PHOS 3.2  --   --  3.9 3.9   Liver Function Tests: Recent Labs  Lab 11/23/23 0523 11/28/23 0419  AST 64* 73*  ALT 22 28  ALKPHOS 53 58  BILITOT 2.2* 1.6*  PROT 7.0 6.6  ALBUMIN  2.4* 2.2*   No results for input(s): LIPASE, AMYLASE in the last 168 hours. No results for input(s): AMMONIA in the last 168 hours. Cardiac Enzymes: No results for input(s): CKTOTAL, CKMB, CKMBINDEX, TROPONINI in the last 168 hours. BNP (last 3 results) No results for input(s): BNP in the last 8760 hours.  ProBNP (last 3 results) No results for input(s): PROBNP in the last 8760 hours.  CBG: No results for input(s): GLUCAP in the last 168 hours. Recent Results (from the past 240 hours)  Urine Culture (for pregnant, neutropenic or urologic patients or patients with an indwelling urinary catheter)     Status: Abnormal   Collection Time: 11/19/23 10:38 AM   Specimen: Urine, Random  Result Value Ref Range Status   Specimen Description URINE, RANDOM  Final   Special Requests   Final    NONE Performed at [facility], [address] [place], [name] [address]    Culture >=100,000 COLONIES/mL ENTEROBACTER CLOACAE (A)  Final   Report Status 11/21/2023 FINAL  Final   Organism ID, Bacteria ENTEROBACTER CLOACAE (A)  Final      Susceptibility   Enterobacter cloacae - MIC*    CEFEPIME <=0.12 SENSITIVE Sensitive      ERTAPENEM <=0.12 SENSITIVE Sensitive     CIPROFLOXACIN  <=0.06 SENSITIVE Sensitive     GENTAMICIN <=1 SENSITIVE Sensitive     NITROFURANTOIN 64 INTERMEDIATE Intermediate     TRIMETH/SULFA <=20 SENSITIVE Sensitive     PIP/TAZO Value in next row Sensitive      <=4 SENSITIVEThis is a modified FDA-approved test that has been validated and its performance characteristics determined by the reporting laboratory.  This laboratory is certified under the Clinical Laboratory Improvement Amendments CLIA as qualified to perform high complexity clinical laboratory testing.    MEROPENEM Value in next row Sensitive      <=4 SENSITIVEThis is a modified FDA-approved test that has been validated and its performance characteristics determined by the reporting laboratory.  This laboratory is certified under the Clinical Laboratory Improvement Amendments CLIA as qualified to perform high complexity clinical laboratory testing.    * >=  100,000 COLONIES/mL ENTEROBACTER CLOACAE     Studies: No results found.     Bert Givans, MD  [facility] 11/29/2023  If 7PM-7AM, please contact night-coverage

## 2023-11-29 NOTE — Progress Notes (Signed)
 Occupational Therapy Treatment Patient Details Name: Joan Wood MRN: [id number] DOB: 1970/04/18 Today's Date: 11/29/2023   History of present illness 53 y.o. female presents to [facility] hospital on 10/03/2023 with nausea/vomiting and bloody diarrhea. Admitted with decompensated alcoholic cirrhosis w/ ascites and hepatic encephalopathy. 9/25 Upper EGD concerning for possible acute esophageal necrosis, portal hypertension, non-bleeding gastric and duodenal ulcers. Pt also underwent paracentesis on 9/25, 10/6, 10/22. PMH includes decompensated cirrhosis, PUD, GERD, pancreatitis, OSA.   OT comments  Pt received seated EOB, agreeable for OT session. Pt appearing more calm and focused today. First portion of session focusing on ADLs- washing pt's hair per request (hair already wet and in towel upon OT arrival). Pt requiring up to mod A for hair washing in attempts to comb through tangles. Pt also participated in scavenger hunt in hallway to challenge attention, problem solving, and recall. She was able to locate 3/3 items in hallway in ~6 minutes. Required some cues for problem solving, maintaining attention, and recalling 1/3 items. Pt continues to be limited by intermittent impulsivity and cognitive deficits. Will continue to follow.      If plan is discharge home, recommend the following:  A little help with bathing/dressing/bathroom;Direct supervision/assist for medications management;Direct supervision/assist for financial management;Assist for transportation;Supervision due to cognitive status   Equipment Recommendations  BSC/3in1    Recommendations for Other Services      Precautions / Restrictions Precautions Precautions: Fall Recall of Precautions/Restrictions: Impaired Precaution/Restrictions Comments: history of orthostasis Restrictions Weight Bearing Restrictions Per Provider Order: No       Mobility Bed Mobility               General bed mobility comments: not assessed - pt  received OOB throughout session    Transfers Overall transfer level: Needs assistance Equipment used: Rolling walker (2 wheels) Transfers: Sit to/from Stand Sit to Stand: Supervision           General transfer comment: Stood from bed reaching for B handles of RW despite cues for improved safety.     Balance Overall balance assessment: Needs assistance Sitting-balance support: No upper extremity supported, Feet supported Sitting balance-Leahy Scale: Good Sitting balance - Comments: seated EOB   Standing balance support: Bilateral upper extremity supported, Reliant on assistive device for balance, During functional activity, No upper extremity supported Standing balance-Leahy Scale: Fair Standing balance comment: sup-CGA, no LOB                           ADL either performed or assessed with clinical judgement   ADL Overall ADL's : Needs assistance/impaired     Grooming: Standing;Brushing hair;Minimal assistance;Moderate assistance Grooming Details (indicate cue type and reason): assist for thoroughness and attempting to detangle matted hair on sides of head             Lower Body Dressing: Supervision/safety;Sitting/lateral leans Lower Body Dressing Details (indicate cue type and reason): adjusting B socks at EOB             Functional mobility during ADLs: Supervision/safety;Contact guard assist;Rolling walker (2 wheels)      Extremity/Trunk Assessment              Vision   Additional Comments: glasses donned during OT session today   Perception     Praxis     Communication Communication Communication: No apparent difficulties   Cognition Arousal: Alert Behavior During Therapy: WFL for tasks assessed/performed Cognition: Cognition impaired  Awareness: Intellectual awareness impaired Memory impairment (select all impairments): Short-term memory, Working memory Attention impairment (select first level of impairment): Sustained  attention, Selective attention (evolving into more selective attention deficits (sustain improving)) Executive functioning impairment (select all impairments): Organization, Reasoning, Problem solving, Initiation OT - Cognition Comments: sustained attention and overall focus improved this date; still needs intermittent redirection                 Following commands: Impaired Following commands impaired: Follows multi-step commands inconsistently      Cueing   Cueing Techniques: Verbal cues, Visual cues  Exercises Exercises: Other exercises Other Exercises Other Exercises: scavenger hunt to challenge recall and divided attention while ambulating in hallway & navigating obstacles (pt able to recall and locate 3/3 items during mobility with reduced organization, cues for problem solving, and cues to maintain attention)    Shoulder Instructions       General Comments abdomen distended    Pertinent Vitals/ Pain       Pain Assessment Pain Assessment: 0-10 Pain Score: 0-No pain  Home Living                                          Prior Functioning/Environment              Frequency  Min 2X/week        Progress Toward Goals  OT Goals(current goals can now be found in the care plan section)  Progress towards OT goals: Progressing toward goals     Plan      Co-evaluation                 AM-PAC OT 6 Clicks Daily Activity     Outcome Measure   Help from another person eating meals?: None Help from another person taking care of personal grooming?: A Little Help from another person toileting, which includes using toliet, bedpan, or urinal?: A Little Help from another person bathing (including washing, rinsing, drying)?: A Lot Help from another person to put on and taking off regular upper body clothing?: A Little Help from another person to put on and taking off regular lower body clothing?: A Little 6 Click Score: 18    End of  Session Equipment Utilized During Treatment: Gait belt;Rolling walker (2 wheels)  OT Visit Diagnosis: Other abnormalities of gait and mobility (R26.89);Muscle weakness (generalized) (M62.81);Pain;Other symptoms and signs involving cognitive function   Activity Tolerance Patient tolerated treatment well   Patient Left with call bell/phone within reach (seated EOB)   Nurse Communication Mobility status        Time: 1105-1130 OT Time Calculation (min): 25 min  Charges: OT General Charges $OT Visit: 1 Visit OT Treatments $Self Care/Home Management : 8-22 mins $Therapeutic Activity: 8-22 mins  Samanth Mirkin M. [place], OTR/L [facility] Acute Rehabilitation Services [phone] Secure Chat Preferred  Rikki Burma 11/29/2023, 2:33 PM

## 2023-11-30 DIAGNOSIS — K7290 Hepatic failure, unspecified without coma: Secondary | ICD-10-CM | POA: Diagnosis not present

## 2023-11-30 DIAGNOSIS — K7460 Unspecified cirrhosis of liver: Secondary | ICD-10-CM | POA: Diagnosis not present

## 2023-11-30 LAB — BASIC METABOLIC PANEL WITH GFR
Anion gap: 8 (ref 5–15)
BUN: 18 mg/dL (ref 6–20)
CO2: 21 mmol/L — ABNORMAL LOW (ref 22–32)
Calcium: 8.2 mg/dL — ABNORMAL LOW (ref 8.9–10.3)
Chloride: 97 mmol/L — ABNORMAL LOW (ref 98–111)
Creatinine, Ser: 0.59 mg/dL (ref 0.44–1.00)
GFR, Estimated: >60 mL/min (ref >60)
Glucose, Bld: 81 mg/dL (ref 70–99)
Potassium: 4.1 mmol/L (ref 3.5–5.1)
Sodium: 126 mmol/L — ABNORMAL LOW (ref 135–145)

## 2023-11-30 LAB — CBC
HCT: 25 % — ABNORMAL LOW (ref 36.0–46.0)
Hemoglobin: 8.6 g/dL — ABNORMAL LOW (ref 12.0–15.0)
MCH: 33.6 pg (ref 26.0–34.0)
MCHC: 34.4 g/dL (ref 30.0–36.0)
MCV: 97.7 fL (ref 80.0–100.0)
Platelets: 128 K/uL — ABNORMAL LOW (ref 150–400)
RBC: 2.56 MIL/uL — ABNORMAL LOW (ref 3.87–5.11)
RDW: 17.3 % — ABNORMAL HIGH (ref 11.5–15.5)
WBC: 5.7 K/uL (ref 4.0–10.5)
nRBC: 0 % (ref 0.0–0.2)

## 2023-11-30 LAB — PHOSPHORUS: Phosphorus: 3.9 mg/dL (ref 2.5–4.6)

## 2023-11-30 LAB — MAGNESIUM: Magnesium: 1.6 mg/dL — ABNORMAL LOW (ref 1.7–2.4)

## 2023-11-30 MED ORDER — POTASSIUM CHLORIDE CRYS ER 20 MEQ PO TBCR
40.0000 meq | EXTENDED_RELEASE_TABLET | Freq: Once | ORAL | Status: AC
  Administered 2023-11-30: 40 meq via ORAL
  Filled 2023-11-30: qty 2

## 2023-11-30 MED ORDER — MAGNESIUM SULFATE 2 GM/50ML IV SOLN
2.0000 g | Freq: Once | INTRAVENOUS | Status: AC
  Administered 2023-11-30: 2 g via INTRAVENOUS
  Filled 2023-11-30: qty 50

## 2023-11-30 NOTE — Progress Notes (Signed)
 IR consulted with request for peritoneal pleur-x drain placement. Patient was deemed a candidate from anatomy perspective; however, multiple factors complicate her ability to manage the drain after discharge. Per Dr. Shelba note: It is unlikely that patient will be able to do unless she has assistance at home, or goes to skilled nursing facility and somebody is there to manage her peritoneal drain.  Patient does have impaired memory and understanding of the situation.  Patient has had a prolonged stay in the hospital due to lack of placement and unsafe home situation. IR will remove the peritoneal drain order from our active requests list for the time being and move forward with paracentesis per conversation with Dr. Sonjia this AM. Team will reach out to IR if adequate post-placement management is arranged, and they would like to move forward with this in future.    Joan Whitenight NP 11/30/2023 11:33 AM

## 2023-11-30 NOTE — Progress Notes (Signed)
 Physical Therapy Treatment Patient Details Name: Joan Wood MRN: [id number] DOB: May 24, 1970 Today's Date: 11/30/2023   History of Present Illness 53 y.o. female presents to [facility] hospital on 10/03/2023 with nausea/vomiting and bloody diarrhea. Admitted with decompensated alcoholic cirrhosis w/ ascites and hepatic encephalopathy. 9/25 Upper EGD concerning for possible acute esophageal necrosis, portal hypertension, non-bleeding gastric and duodenal ulcers. Pt also underwent paracentesis on 9/25, 10/6, 10/22. PMH includes decompensated cirrhosis, PUD, GERD, pancreatitis, OSA.    PT Comments  Pt received sitting EOB and agreeable to session. Pt able to tolerate increased gait distance without AD and with 1 seated rest break due to fatigue. Pt able to perform stair trials and BLE step ups with increased difficulty with RLE due to weakness. Pt reports mild dizziness during step ups requiring seated rest break, but pt noted to hold breath with exertion so education provided on breathing technique. Pt demonstrates slightly improved memory and carryover, but still requires cues for safety throughout. Pt continues to benefit from PT services to progress toward functional mobility goals.    If plan is discharge home, recommend the following: A lot of help with walking and/or transfers;A lot of help with bathing/dressing/bathroom;Assistance with cooking/housework;Assist for transportation;Help with stairs or ramp for entrance;Direct supervision/assist for medications management;Direct supervision/assist for financial management;Supervision due to cognitive status   Can travel by private vehicle     Yes  Equipment Recommendations  Rolling walker (2 wheels);BSC/3in1    Recommendations for Other Services       Precautions / Restrictions Precautions Precautions: Fall Recall of Precautions/Restrictions: Impaired Precaution/Restrictions Comments: history of orthostasis Restrictions Weight Bearing  Restrictions Per Provider Order: No     Mobility  Bed Mobility               General bed mobility comments: Pt sitting EOB at beginning and in recliner at end of session    Transfers Overall transfer level: Needs assistance Equipment used: None Transfers: Sit to/from Stand Sit to Stand: Supervision           General transfer comment: for safety    Ambulation/Gait Ambulation/Gait assistance: Contact guard assist Gait Distance (Feet): 165 Feet (+50) Assistive device: None Gait Pattern/deviations: Step-through pattern, Decreased stride length, Drifts right/left Gait velocity: decr     General Gait Details: slowed gait with low foot clearance and decreased step length. CGA without AD due to some unsteadiness, but no overt LOB   Stairs Stairs: Yes Stairs assistance: Contact guard assist Stair Management: Step to pattern, Two rails Number of Stairs: 2 (x2) General stair comments: CGA for safety and pt demonstrating slow, guarded steps   Wheelchair Mobility     Tilt Bed    Modified Rankin (Stroke Patients Only)       Balance Overall balance assessment: Needs assistance Sitting-balance support: No upper extremity supported, Feet supported Sitting balance-Leahy Scale: Good Sitting balance - Comments: seated EOB   Standing balance support: During functional activity, No upper extremity supported Standing balance-Leahy Scale: Fair Standing balance comment: CGA without AD                            Communication Communication Communication: No apparent difficulties  Cognition Arousal: Alert Behavior During Therapy: WFL for tasks assessed/performed   PT - Cognitive impairments: Awareness, Problem solving, Safety/Judgement                         Following commands: Impaired Following  commands impaired: Follows multi-step commands inconsistently    Cueing Cueing Techniques: Verbal cues, Visual cues  Exercises Other  Exercises Other Exercises: BLE step ups x10 each. Increased reliance on BUE with RLE Other Exercises: static standing marches without UE support with focus on balance challenge    General Comments        Pertinent Vitals/Pain Pain Assessment Pain Assessment: Faces Faces Pain Scale: Hurts a little bit Pain Location: B feet Pain Descriptors / Indicators: Discomfort Pain Intervention(s): Monitored during session, Repositioned     PT Goals (current goals can now be found in the care plan section) Acute Rehab PT Goals Patient Stated Goal: to return to independence PT Goal Formulation: With patient Time For Goal Achievement: 11/29/23 Progress towards PT goals: Progressing toward goals    Frequency    Min 2X/week       AM-PAC PT 6 Clicks Mobility   Outcome Measure  Help needed turning from your back to your side while in a flat bed without using bedrails?: A Little Help needed moving from lying on your back to sitting on the side of a flat bed without using bedrails?: A Little Help needed moving to and from a bed to a chair (including a wheelchair)?: A Little Help needed standing up from a chair using your arms (e.g., wheelchair or bedside chair)?: A Little Help needed to walk in hospital room?: A Little Help needed climbing 3-5 steps with a railing? : A Little 6 Click Score: 18    End of Session Equipment Utilized During Treatment: Gait belt Activity Tolerance: Patient tolerated treatment well Patient left: in chair;with chair alarm set;with call bell/phone within reach Nurse Communication: Mobility status PT Visit Diagnosis: Other abnormalities of gait and mobility (R26.89);Muscle weakness (generalized) (M62.81);Unsteadiness on feet (R26.81);Difficulty in walking, not elsewhere classified (R26.2)     Time: 8966-8944 PT Time Calculation (min) (ACUTE ONLY): 22 min  Charges:    $Gait Training: 8-22 mins PT General Charges $$ ACUTE PT VISIT: 1 Visit                     Darryle George, PTA Acute Rehabilitation Services Secure Chat Preferred  [phone]    Darryle George 11/30/2023, 12:48 PM

## 2023-11-30 NOTE — Progress Notes (Signed)
 PROGRESS NOTE  Joan Wood FMW:[id number] DOB: 18-Dec-1970 DOA: 10/03/2023 PCP: Edman Meade PEDLAR, FNP   LOS: 58 days   Brief narrative:  53 y.o. female with a history of decompensated liver cirrhosis, PUD, gastric ulcer perforation status post ex lap, GERD, pancreatitis, obstructive sleep apnea presented to hospital secondary to nausea, coffee-ground emesis, bloody diarrhea with concern for upper GI bleed. 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 Hospitalization was complicated by hepatic encephalopathy treated with lactulose  and rifaximin  followed by lactulose  alone. Patient also requiring recurrent paracenteses for her recurrent ascites.  Currently patient has remained stable is awaiting for placement.  Has had a prolonged stay in the hospital due to lack of placement and unsafe home situation.  Assessment/Plan: Principal Problem:   Decompensated cirrhosis (HCC) Active Problems:   GI bleed   Liver cirrhosis (HCC)   Esophageal necrosis   Acute appendicitis   Goals of care, counseling/discussion   Alcohol  use disorder   Hyponatremia   Tobacco abuse   Nausea vomiting and diarrhea   Acute on chronic blood loss anemia   ABLA (acute blood loss anemia)   AKI (acute kidney injury)   Pressure injury of skin   Protein-calorie malnutrition, severe   Decompensated alcoholic cirrhosis with recurrent ascites Underwent therapeutic paracentesis on 11/12/2023 with removal of 750 cc of ascitic fluid.  Ascitic fluid was not consistent with SBP.  Received IV Rocephin  for UTI.  Patient was initially treated with rifaximin  and lactulose  for encephalopathy.  At this time, only on lactulose  has been continued since rifaximin  will not be covered at the outpatient facility.  Patient underwent  ultrasound-guided paracentesis on 11/24/2023 again with  removal of 3.6 L of fluid and will be considered for repeat paracentesis today.  I did speak with interventional radiology about potential peritoneal drain placement since she had it in the past but given her current circumstances and need for follow-up and management of drains, it is unlikely that patient will be able to do unless she has assistance at home, or goes to skilled nursing facility and somebody is there to manage her peritoneal drain.  Patient does have impaired memory and understanding of the situation.  Spoke about fluid restriction.  Hepatic encephalopathy Improved.  Continue lactulose  to keep bowel movements 3-4 times a day.  States that she has been having good bowel movements.   Enterobacter UTI. Treated with Bactrim  DS twice daily and has completed the course  Hypokalemia Latest potassium of 4.1.  Will monitor periodically.  Hypomagnesemia.  Magnesium  level still low at 1.6 after replacement.  Will repeat 2 g of magnesium  sulfate again today.  Hypoalbuminemia Secondary to underlying liver disease.  Latest albumin  of 2.2.  Causing peripheral edema and recurrent ascites.   Thrombocytopenia Secondary to liver disease.  Latest CBC showed hemoglobin of 9.0 and stable and Platelet of 134 and improving.   Acute kidney injury Presumed secondary to ATN from contrast. Nephrology was consulted and renal function has improved also.  Latest creatinine of 0.5   Acute appendicitis General surgery was consulted and patient was thought to be high surgical risk so was treated conservatively with antibiotics.  No further abdominal pain.  Chronic hypotension Has received albumin  intermittently.  Currently on midodrine  20 milligram 3 times daily.  Latest blood pressure of 111/64 and has remained stable.  Chronic hyponatremia Patient's baseline appears to be around 125 -128.  Latest  sodium of 126.  Nephrology also saw the patient during hospitalization.   Acute blood loss anemia  secondary to acute upper GI bleed from esophagitis and esophageal necrosis Patient was seen by GI during hospitalization and underwent upper GI endoscopy on 10/04/2023 with findings of grade D esophagitis in addition to concern for acute esophageal necrosis. Non-bleeding gastric ulcer noted GI recommended Carafate  for 2 weeks and Protonix  twice daily with outpatient GI follow-up.  Patient received 4 units of packed RBC during this hospitalization.   Latest hemoglobin of 8.6 and has remained stable.  Will transfuse if necessary.    Lower extremity edema, right ankle tenderness. Peripheral neuropathy Secondary to hypoalbuminemia from liver disease.  Duplex ultrasound of the lower extremity negative for DVT.   x-ray of the right ankle without any bony issues.  Gabapentin  dose has been increased from 300 milligram to 600 mg 3 times daily for the last few days and has been tolerating.  Will need to closely monitor for sedation..  Continue Lasix  40 mg twice daily.  Slightly elevated uric acid level.  Received colchicine  0.6 mg daily for few days.  Currently off colchicine .  Tobacco abuse Counseling done   History of alcohol  abuse No active issues.  Counseling done.  Continue multivitamin and thiamine .  No signs of withdrawal.   Severe protein malnutrition Nutrition Status: Body mass index is 22.62 kg/m.  Nutrition Problem: Severe Malnutrition Etiology: chronic illness (cirrhosis) Signs/Symptoms: severe fat depletion, severe muscle depletion Interventions: Ensure Enlive (each supplement provides 350kcal and 20 grams of protein), MVI, Liberalize Diet, Magic cup Continue nutritional supplement.  Continue multivitamin thiamine  and folic acid .   Pressure injury sacrum stage II pressure Wound 10/15/23 0900 Pressure Injury Sacrum Mid Stage 2 -  Partial thickness loss of dermis presenting as a shallow open injury with a red, pink wound bed without slough. (Active)  Unclear if present on admission but  continue wound care.   Goals of care Plan for skilled nursing facility for rehabitation.  TOC on board.  DVT prophylaxis: SCDs Start: 10/03/23 2153   Disposition: Awaiting for disposition likely to skilled nursing facility.  Barriers include unsafe home situation, inadequate or no insurance,  Status is: Inpatient  Remains inpatient appropriate because: , need for skilled nursing facility placement    Code Status:     Code Status: Full Code  Family Communication: Spoke with the patient's son [name] on the phone on 11/22/2023  Consultants: GI Interventional radiology Palliative care General surgery Nephrology  Procedures: Paracentesis on 11/19/2023, 11/24/2023   Anti-infectives:  None currently  Subjective: Today, patient was seen and examined at bedside.  Still complains of mild leg pain and edema.  Denies any nausea vomiting fever chills shortness of breath or dyspnea.  Has been having bowel movements.  Objective: Vitals:   11/29/23 2300 11/30/23 0356  BP: (!) 108/54 111/64  Pulse: 98 100  Resp: 18 19  Temp: 98.1 F (36.7 C) 98.2 F (36.8 C)  SpO2: 96% 99%    Intake/Output Summary (Last 24 hours) at 11/30/2023 0936 Last data filed at 11/30/2023 0539 Gross per 24 hour  Intake 380 ml  Output --  Net 380 ml   Filed Weights   11/19/23 0500 11/23/23 0500 11/27/23 1118  Weight: 56.1 kg 58.1 kg 55.9 kg   Body mass index is 22.55 kg/m.   Physical Exam:  General: Thinly built, not in obvious distress, Communicative, appears older than stated age, HENT:   No scleral pallor or icterus noted. Oral  mucosa is moist.  Chest: Diminished breath sounds bilaterally. CVS: S1 &S2 heard. No murmur.  Regular rate and rhythm. Abdomen: Soft, distended abdomen with ascites, dullness on percussion. Extremities: No cyanosis, clubbing with pitting edema of the bilateral lower extremities.  Peripheral pulses are palpable. Psych: Alert, awake and oriented, slightly anxious  mood. CNS:  No cranial nerve deficits.  Moves all extremities. Skin: Warm and dry.  Sacral ulceration stage II.   Data Review: I have personally reviewed the following laboratory data and studies,  CBC: Recent Labs  Lab 11/24/23 1534 11/26/23 0227 11/28/23 0419 11/29/23 0350 11/30/23 0205  WBC 5.4 4.8 3.7* 5.0 5.7  HGB 8.2* 8.5* 8.4* 9.0* 8.6*  HCT 24.2* 24.5* 24.1* 26.2* 25.0*  MCV 98.0 97.6 96.8 98.5 97.7  PLT 118* 98* 129* 134* 128*   Basic Metabolic Panel: Recent Labs  Lab 11/24/23 1534 11/26/23 0227 11/28/23 0419 11/29/23 0350 11/30/23 0205  NA 128* 124* 131* 130* 126*  K 3.8 4.5 2.8* 3.1* 4.1  CL 98 95* 96* 96* 97*  CO2 21* 20* 24 22 21*  GLUCOSE 101* 97 102* 90 81  BUN 16 20 23* 16 18  CREATININE 0.55 0.55 0.58 0.51 0.59  CALCIUM  8.2* 8.1* 8.1* 8.0* 8.2*  MG 1.6* 1.8 1.4* 1.7 1.6*  PHOS  --   --  3.9 3.9 3.9   Liver Function Tests: Recent Labs  Lab 11/28/23 0419  AST 73*  ALT 28  ALKPHOS 58  BILITOT 1.6*  PROT 6.6  ALBUMIN  2.2*   No results for input(s): LIPASE, AMYLASE in the last 168 hours. No results for input(s): AMMONIA in the last 168 hours. Cardiac Enzymes: No results for input(s): CKTOTAL, CKMB, CKMBINDEX, TROPONINI in the last 168 hours. BNP (last 3 results) No results for input(s): BNP in the last 8760 hours.  ProBNP (last 3 results) No results for input(s): PROBNP in the last 8760 hours.  CBG: No results for input(s): GLUCAP in the last 168 hours. No results found for this or any previous visit (from the past 240 hours).    Studies: No results found.     Darrielle Pflieger, MD  [facility] 11/30/2023  If 7PM-7AM, please contact night-coverage

## 2023-12-01 ENCOUNTER — Inpatient Hospital Stay (HOSPITAL_COMMUNITY)

## 2023-12-01 DIAGNOSIS — D62 Acute posthemorrhagic anemia: Secondary | ICD-10-CM | POA: Diagnosis not present

## 2023-12-01 DIAGNOSIS — K7290 Hepatic failure, unspecified without coma: Secondary | ICD-10-CM | POA: Diagnosis not present

## 2023-12-01 DIAGNOSIS — R197 Diarrhea, unspecified: Secondary | ICD-10-CM | POA: Diagnosis not present

## 2023-12-01 DIAGNOSIS — K7460 Unspecified cirrhosis of liver: Secondary | ICD-10-CM | POA: Diagnosis not present

## 2023-12-01 DIAGNOSIS — K2289 Other specified disease of esophagus: Secondary | ICD-10-CM | POA: Diagnosis not present

## 2023-12-01 DIAGNOSIS — R112 Nausea with vomiting, unspecified: Secondary | ICD-10-CM | POA: Diagnosis not present

## 2023-12-01 DIAGNOSIS — K922 Gastrointestinal hemorrhage, unspecified: Secondary | ICD-10-CM | POA: Diagnosis not present

## 2023-12-01 LAB — COMPREHENSIVE METABOLIC PANEL WITH GFR
ALT: 30 U/L (ref 0–44)
AST: 78 U/L — ABNORMAL HIGH (ref 15–41)
Albumin: 2.4 g/dL — ABNORMAL LOW (ref 3.5–5.0)
Alkaline Phosphatase: 59 U/L (ref 38–126)
Anion gap: 11 (ref 5–15)
BUN: 20 mg/dL (ref 6–20)
CO2: 23 mmol/L (ref 22–32)
Calcium: 8.5 mg/dL — ABNORMAL LOW (ref 8.9–10.3)
Chloride: 92 mmol/L — ABNORMAL LOW (ref 98–111)
Creatinine, Ser: 0.59 mg/dL (ref 0.44–1.00)
GFR, Estimated: >60 mL/min (ref >60)
Glucose, Bld: 92 mg/dL (ref 70–99)
Potassium: 4.2 mmol/L (ref 3.5–5.1)
Sodium: 126 mmol/L — ABNORMAL LOW (ref 135–145)
Total Bilirubin: 2.2 mg/dL — ABNORMAL HIGH (ref 0.0–1.2)
Total Protein: 7.6 g/dL (ref 6.5–8.1)

## 2023-12-01 LAB — CBC
HCT: 25.8 % — ABNORMAL LOW (ref 36.0–46.0)
Hemoglobin: 8.9 g/dL — ABNORMAL LOW (ref 12.0–15.0)
MCH: 33.5 pg (ref 26.0–34.0)
MCHC: 34.5 g/dL (ref 30.0–36.0)
MCV: 97 fL (ref 80.0–100.0)
Platelets: 137 K/uL — ABNORMAL LOW (ref 150–400)
RBC: 2.66 MIL/uL — ABNORMAL LOW (ref 3.87–5.11)
RDW: 16.9 % — ABNORMAL HIGH (ref 11.5–15.5)
WBC: 6.3 K/uL (ref 4.0–10.5)
nRBC: 0 % (ref 0.0–0.2)

## 2023-12-01 LAB — MAGNESIUM: Magnesium: 1.7 mg/dL (ref 1.7–2.4)

## 2023-12-01 MED ORDER — LIDOCAINE HCL 1 % IJ SOLN
INTRAMUSCULAR | Status: AC
  Filled 2023-12-01: qty 10

## 2023-12-01 MED ORDER — MAGNESIUM SULFATE 2 GM/50ML IV SOLN
2.0000 g | Freq: Once | INTRAVENOUS | Status: AC
  Administered 2023-12-01: 2 g via INTRAVENOUS
  Filled 2023-12-01: qty 50

## 2023-12-01 NOTE — Progress Notes (Signed)
 PROGRESS NOTE  Joan Wood FMW:[id number] DOB: Nov 24, 1970 DOA: 10/03/2023 PCP: Edman Meade PEDLAR, FNP   LOS: 59 days   Brief narrative:  53 y.o. female with a history of decompensated liver cirrhosis, PUD, gastric ulcer perforation status post ex lap, GERD, pancreatitis, obstructive sleep apnea presented to hospital secondary to nausea, coffee-ground emesis, bloody diarrhea with concern for upper GI bleed. 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 Hospitalization was complicated by hepatic encephalopathy treated with lactulose  and rifaximin  followed by lactulose  alone. Patient also requiring recurrent paracenteses for her recurrent ascites.  Currently patient has remained stable is awaiting for placement.  Has had a prolonged stay in the hospital due to lack of placement and unsafe home situation.  11/22: Had paracentesis with removal of 4.4 L of fluid.  PleurX catheter placement was deferred by IR unless she has someone to take care of it.  Will need to reconsult when that sorted out.  Assessment/Plan: Principal Problem:   Decompensated cirrhosis (HCC) Active Problems:   GI bleed   Liver cirrhosis (HCC)   Esophageal necrosis   Acute appendicitis   Goals of care, counseling/discussion   Alcohol  use disorder   Hyponatremia   Tobacco abuse   Nausea vomiting and diarrhea   Acute on chronic blood loss anemia   ABLA (acute blood loss anemia)   AKI (acute kidney injury)   Pressure injury of skin   Protein-calorie malnutrition, severe   Decompensated alcoholic cirrhosis with recurrent ascites Underwent therapeutic paracentesis on 11/12/2023 with removal of 750 cc of ascitic fluid.  Ascitic fluid was not consistent with SBP.  Received IV Rocephin  for UTI.  Patient was initially treated with rifaximin  and lactulose  for encephalopathy.  At  this time, only on lactulose  has been continued since rifaximin  will not be covered at the outpatient facility.  Patient underwent  ultrasound-guided paracentesis on 11/24/2023 again with removal of 3.6 L of fluid and will be considered for repeat paracentesis today.  I did speak with interventional radiology about potential peritoneal drain placement since she had it in the past but given her current circumstances and need for follow-up and management of drains, it is unlikely that patient will be able to do unless she has assistance at home, or goes to skilled nursing facility and somebody is there to manage her peritoneal drain.  Patient does have impaired memory and understanding of the situation.  Spoke about fluid restriction. 11/22: Another paracentesis with removal of 4.4 L of fluid Patient need someone to take care of PleurX catheter before inserting it per IR  Hepatic encephalopathy Improved.  Continue lactulose  to keep bowel movements 3-4 times a day.  States that she has been having good bowel movements.   Enterobacter UTI. Treated with Bactrim  DS twice daily and has completed the course  Hypokalemia Latest potassium of 4.2.  Will monitor periodically.  Hypomagnesemia.  Magnesium  level still low at 1.7 after replacement.  Will repeat 2 g of magnesium  sulfate again today.  Hypoalbuminemia Secondary to underlying liver disease.  Latest albumin  of 2.2.  Causing peripheral edema and recurrent ascites.   Thrombocytopenia Secondary to liver disease.  Latest CBC showed hemoglobin of 9.0 and stable and Platelet of 134 and improving.   Acute kidney injury Presumed secondary to ATN from contrast. Nephrology was consulted and renal function has improved also.    Acute appendicitis General surgery was consulted and patient  was thought to be high surgical risk so was treated conservatively with antibiotics.  No further abdominal pain.  Chronic hypotension Has received albumin   intermittently.  Currently on midodrine  20 milligram 3 times daily.  Latest blood pressure of 111/64 and has remained stable.  Chronic hyponatremia Patient's baseline appears to be around 125 -128.  Latest sodium of 126.  Nephrology also saw the patient during hospitalization.   Acute blood loss anemia secondary to acute upper GI bleed from esophagitis and esophageal necrosis Patient was seen by GI during hospitalization and underwent upper GI endoscopy on 10/04/2023 with findings of grade D esophagitis in addition to concern for acute esophageal necrosis. Non-bleeding gastric ulcer noted GI recommended Carafate  for 2 weeks and Protonix  twice daily with outpatient GI follow-up.  Patient received 4 units of packed RBC during this hospitalization.   Latest hemoglobin of 8.9 and has remained stable.  Will transfuse if necessary.    Lower extremity edema, right ankle tenderness. Peripheral neuropathy Secondary to hypoalbuminemia from liver disease.  Duplex ultrasound of the lower extremity negative for DVT.   x-ray of the right ankle without any bony issues.  Gabapentin  dose has been increased from 300 milligram to 600 mg 3 times daily for the last few days and has been tolerating.  Will need to closely monitor for sedation..  Continue Lasix  40 mg twice daily.  Slightly elevated uric acid level.  Received colchicine  0.6 mg daily for few days.  Currently off colchicine .  Tobacco abuse Counseling done   History of alcohol  abuse No active issues.  Counseling done.  Continue multivitamin and thiamine .  No signs of withdrawal.   Severe protein malnutrition Nutrition Status: Body mass index is 22.62 kg/m.  Nutrition Problem: Severe Malnutrition Etiology: chronic illness (cirrhosis) Signs/Symptoms: severe fat depletion, severe muscle depletion Interventions: Ensure Enlive (each supplement provides 350kcal and 20 grams of protein), MVI, Liberalize Diet, Magic cup Continue nutritional supplement.   Continue multivitamin thiamine  and folic acid .   Pressure injury sacrum stage II pressure Wound 10/15/23 0900 Pressure Injury Sacrum Mid Stage 2 -  Partial thickness loss of dermis presenting as a shallow open injury with a red, pink wound bed without slough. (Active)  Unclear if present on admission but continue wound care.   Goals of care Plan for skilled nursing facility for rehabitation.  TOC on board.  DVT prophylaxis: SCDs Start: 10/03/23 2153   Disposition: Awaiting for disposition likely to skilled nursing facility.  Barriers include unsafe home situation, inadequate or no insurance,  Status is: Inpatient  Remains inpatient appropriate because: , need for skilled nursing facility placement    Code Status:     Code Status: Full Code  Family Communication: Discussed with patient  Consultants: GI Interventional radiology Palliative care General surgery Nephrology  Procedures: Paracentesis on 11/19/2023, 11/24/2023   Anti-infectives:  None currently  Subjective: Patient was seen and examined today.  No new concern.  Stating that her belly feels much better after removal of more fluid.  Objective: Vitals:   12/01/23 0752 12/01/23 1208  BP: 108/82 119/70  Pulse: (!) 101 95  Resp: 17 18  Temp: 99.5 F (37.5 C) 98.2 F (36.8 C)  SpO2: 99% 100%   No intake or output data in the 24 hours ending 12/01/23 1356  Filed Weights   11/19/23 0500 11/23/23 0500 11/27/23 1118  Weight: 56.1 kg 58.1 kg 55.9 kg   Body mass index is 22.55 kg/m.   Physical Exam:  General.  Frail and malnourished  lady, in no acute distress. Pulmonary.  Lungs clear bilaterally, normal respiratory effort. CV.  Regular rate and rhythm, no JVD, rub or murmur. Abdomen.  Soft, nontender, distended, BS positive. CNS.  Alert and oriented .  No focal neurologic deficit. Extremities.  No edema, no cyanosis, pulses intact and symmetrical.    Data Review: I have personally reviewed the  following laboratory data and studies,  CBC: Recent Labs  Lab 11/26/23 0227 11/28/23 0419 11/29/23 0350 11/30/23 0205 12/01/23 0551  WBC 4.8 3.7* 5.0 5.7 6.3  HGB 8.5* 8.4* 9.0* 8.6* 8.9*  HCT 24.5* 24.1* 26.2* 25.0* 25.8*  MCV 97.6 96.8 98.5 97.7 97.0  PLT 98* 129* 134* 128* 137*   Basic Metabolic Panel: Recent Labs  Lab 11/26/23 0227 11/28/23 0419 11/29/23 0350 11/30/23 0205 12/01/23 0551  NA 124* 131* 130* 126* 126*  K 4.5 2.8* 3.1* 4.1 4.2  CL 95* 96* 96* 97* 92*  CO2 20* 24 22 21* 23  GLUCOSE 97 102* 90 81 92  BUN 20 23* 16 18 20   CREATININE 0.55 0.58 0.51 0.59 0.59  CALCIUM  8.1* 8.1* 8.0* 8.2* 8.5*  MG 1.8 1.4* 1.7 1.6* 1.7  PHOS  --  3.9 3.9 3.9  --    Liver Function Tests: Recent Labs  Lab 11/28/23 0419 12/01/23 0551  AST 73* 78*  ALT 28 30  ALKPHOS 58 59  BILITOT 1.6* 2.2*  PROT 6.6 7.6  ALBUMIN  2.2* 2.4*   No results for input(s): LIPASE, AMYLASE in the last 168 hours. No results for input(s): AMMONIA in the last 168 hours. Cardiac Enzymes: No results for input(s): CKTOTAL, CKMB, CKMBINDEX, TROPONINI in the last 168 hours. BNP (last 3 results) No results for input(s): BNP in the last 8760 hours.  ProBNP (last 3 results) No results for input(s): PROBNP in the last 8760 hours.  CBG: No results for input(s): GLUCAP in the last 168 hours. No results found for this or any previous visit (from the past 240 hours).    Studies: No results found.  This record has been created using Conservation officer, historic buildings. Errors have been sought and corrected,but may not always be located. Such creation errors do not reflect on the standard of care.    Amaryllis Dare, MD  [facility] 12/01/2023  If 7PM-7AM, please contact night-coverage

## 2023-12-01 NOTE — Procedures (Signed)
 PROCEDURE SUMMARY:  Successful ultrasound guided paracentesis from the right lower quadrant.  Yielded 4.4 L of clear yellow fluid.  No immediate complications.  The patient tolerated the procedure well.   Specimen was not sent for labs.  EBL < 5mL  The patient has required >/=2 paracenteses in a 30 day period and a screening evaluation by the [facility] has been arranged. Patient referred to Dr. Jennefer for consideration today.  Clotilda Hesselbach, PA-C

## 2023-12-02 DIAGNOSIS — R197 Diarrhea, unspecified: Secondary | ICD-10-CM | POA: Diagnosis not present

## 2023-12-02 DIAGNOSIS — D62 Acute posthemorrhagic anemia: Secondary | ICD-10-CM | POA: Diagnosis not present

## 2023-12-02 DIAGNOSIS — K2289 Other specified disease of esophagus: Secondary | ICD-10-CM | POA: Diagnosis not present

## 2023-12-02 DIAGNOSIS — E43 Unspecified severe protein-calorie malnutrition: Secondary | ICD-10-CM | POA: Diagnosis not present

## 2023-12-02 DIAGNOSIS — K922 Gastrointestinal hemorrhage, unspecified: Secondary | ICD-10-CM | POA: Diagnosis not present

## 2023-12-02 DIAGNOSIS — R112 Nausea with vomiting, unspecified: Secondary | ICD-10-CM | POA: Diagnosis not present

## 2023-12-02 DIAGNOSIS — F1090 Alcohol use, unspecified, uncomplicated: Secondary | ICD-10-CM | POA: Diagnosis not present

## 2023-12-02 DIAGNOSIS — K7460 Unspecified cirrhosis of liver: Secondary | ICD-10-CM | POA: Diagnosis not present

## 2023-12-02 DIAGNOSIS — K7290 Hepatic failure, unspecified without coma: Secondary | ICD-10-CM | POA: Diagnosis not present

## 2023-12-02 NOTE — Plan of Care (Signed)
  Problem: Clinical Measurements: Goal: Ability to maintain clinical measurements within normal limits will improve Outcome: Progressing Goal: Will remain free from infection Outcome: Progressing Goal: Diagnostic test results will improve Outcome: Progressing Goal: Respiratory complications will improve Outcome: Progressing Goal: Cardiovascular complication will be avoided Outcome: Progressing   Problem: Activity: Goal: Risk for activity intolerance will decrease Outcome: Progressing   Problem: Elimination: Goal: Will not experience complications related to bowel motility Outcome: Progressing Goal: Will not experience complications related to urinary retention Outcome: Progressing

## 2023-12-02 NOTE — Plan of Care (Signed)
 Walks independently in the hallway. Diversion activity was provided. Had good nap during the day time.  Problem: Health Behavior/Discharge Planning: Goal: Ability to manage health-related needs will improve Outcome: Progressing   Problem: Clinical Measurements: Goal: Ability to maintain clinical measurements within normal limits will improve Outcome: Progressing   Problem: Activity: Goal: Risk for activity intolerance will decrease Outcome: Progressing   Problem: Clinical Measurements: Goal: Will remain free from infection Outcome: Progressing   Problem: Elimination: Goal: Will not experience complications related to bowel motility Outcome: Progressing   Problem: Skin Integrity: Goal: Risk for impaired skin integrity will decrease Outcome: Progressing   Problem: Pain Managment: Goal: General experience of comfort will improve and/or be controlled Outcome: Progressing

## 2023-12-02 NOTE — Progress Notes (Signed)
 PROGRESS NOTE  Joan Wood FMW:[id number] DOB: April 07, 1970 DOA: 10/03/2023 PCP: Edman Meade PEDLAR, FNP   LOS: 60 days   Brief narrative:  53 y.o. female with a history of decompensated liver cirrhosis, PUD, gastric ulcer perforation status post ex lap, GERD, pancreatitis, obstructive sleep apnea presented to hospital secondary to nausea, coffee-ground emesis, bloody diarrhea with concern for upper GI bleed. 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 Hospitalization was complicated by hepatic encephalopathy treated with lactulose  and rifaximin  followed by lactulose  alone. Patient also requiring recurrent paracenteses for her recurrent ascites.  Currently patient has remained stable is awaiting for placement.  Has had a prolonged stay in the hospital due to lack of placement and unsafe home situation.  11/22: Had paracentesis with removal of 4.4 L of fluid.  PleurX catheter placement was deferred by IR unless she has someone to take care of it.  Will need to reconsult when that sorted out.  Assessment/Plan: Principal Problem:   Decompensated cirrhosis (HCC) Active Problems:   GI bleed   Liver cirrhosis (HCC)   Esophageal necrosis   Acute appendicitis   Goals of care, counseling/discussion   Alcohol  use disorder   Hyponatremia   Tobacco abuse   Nausea vomiting and diarrhea   Acute on chronic blood loss anemia   ABLA (acute blood loss anemia)   AKI (acute kidney injury)   Pressure injury of skin   Protein-calorie malnutrition, severe   Decompensated alcoholic cirrhosis with recurrent ascites Underwent therapeutic paracentesis on 11/12/2023 with removal of 750 cc of ascitic fluid.  Ascitic fluid was not consistent with SBP.  Received IV Rocephin  for UTI.  Patient was initially treated with rifaximin  and lactulose  for encephalopathy.  At  this time, only on lactulose  has been continued since rifaximin  will not be covered at the outpatient facility.  Patient underwent  ultrasound-guided paracentesis on 11/24/2023 again with removal of 3.6 L of fluid and will be considered for repeat paracentesis today.  I did speak with interventional radiology about potential peritoneal drain placement since she had it in the past but given her current circumstances and need for follow-up and management of drains, it is unlikely that patient will be able to do unless she has assistance at home, or goes to skilled nursing facility and somebody is there to manage her peritoneal drain.  Patient does have impaired memory and understanding of the situation.  Spoke about fluid restriction. 11/22: Another paracentesis with removal of 4.4 L of fluid Patient need someone to take care of PleurX catheter before inserting it per IR  Hepatic encephalopathy Improved.  Continue lactulose  to keep bowel movements 3-4 times a day.  States that she has been having good bowel movements.   Enterobacter UTI. Treated with Bactrim  DS twice daily and has completed the course  Hypokalemia Latest potassium of 4.2.  Will monitor periodically.  Hypomagnesemia.  Magnesium  level still low at 1.7 after replacement.  Will repeat 2 g of magnesium  sulfate again today.  Hypoalbuminemia Secondary to underlying liver disease.  Latest albumin  of 2.2.  Causing peripheral edema and recurrent ascites.   Thrombocytopenia Secondary to liver disease.  Latest CBC showed hemoglobin of 9.0 and stable and Platelet of 134 and improving.   Acute kidney injury Presumed secondary to ATN from contrast. Nephrology was consulted and renal function has improved also.    Acute appendicitis General surgery was consulted and patient  was thought to be high surgical risk so was treated conservatively with antibiotics.  No further abdominal pain.  Chronic hypotension Has received albumin   intermittently.  Currently on midodrine  20 milligram 3 times daily.  Latest blood pressure of 111/64 and has remained stable.  Chronic hyponatremia Patient's baseline appears to be around 125 -128.  Latest sodium of 126.  Nephrology also saw the patient during hospitalization.   Acute blood loss anemia secondary to acute upper GI bleed from esophagitis and esophageal necrosis Patient was seen by GI during hospitalization and underwent upper GI endoscopy on 10/04/2023 with findings of grade D esophagitis in addition to concern for acute esophageal necrosis. Non-bleeding gastric ulcer noted GI recommended Carafate  for 2 weeks and Protonix  twice daily with outpatient GI follow-up.  Patient received 4 units of packed RBC during this hospitalization.   Latest hemoglobin of 8.9 and has remained stable.  Will transfuse if necessary.    Lower extremity edema, right ankle tenderness. Peripheral neuropathy Secondary to hypoalbuminemia from liver disease.  Duplex ultrasound of the lower extremity negative for DVT.   x-ray of the right ankle without any bony issues.  Gabapentin  dose has been increased from 300 milligram to 600 mg 3 times daily for the last few days and has been tolerating.  Will need to closely monitor for sedation..  Continue Lasix  40 mg twice daily.  Slightly elevated uric acid level.  Received colchicine  0.6 mg daily for few days.  Currently off colchicine .  Tobacco abuse Counseling done   History of alcohol  abuse No active issues.  Counseling done.  Continue multivitamin and thiamine .  No signs of withdrawal.   Severe protein malnutrition Nutrition Status: Body mass index is 22.62 kg/m.  Nutrition Problem: Severe Malnutrition Etiology: chronic illness (cirrhosis) Signs/Symptoms: severe fat depletion, severe muscle depletion Interventions: Ensure Enlive (each supplement provides 350kcal and 20 grams of protein), MVI, Liberalize Diet, Magic cup Continue nutritional supplement.   Continue multivitamin thiamine  and folic acid .   Pressure injury sacrum stage II pressure Wound 10/15/23 0900 Pressure Injury Sacrum Mid Stage 2 -  Partial thickness loss of dermis presenting as a shallow open injury with a red, pink wound bed without slough. (Active)  Unclear if present on admission but continue wound care.   Goals of care Plan for skilled nursing facility for rehabitation.  TOC on board.  DVT prophylaxis: SCDs Start: 10/03/23 2153   Disposition: Awaiting for disposition likely to skilled nursing facility.  Barriers include unsafe home situation, inadequate or no insurance,  Status is: Inpatient  Remains inpatient appropriate because: , need for skilled nursing facility placement    Code Status:     Code Status: Full Code  Family Communication: Discussed with patient  Consultants: GI Interventional radiology Palliative care General surgery Nephrology  Procedures: Paracentesis on 11/19/2023, 11/24/2023, 11/22   Anti-infectives:  None currently  Subjective: Patient was resting comfortably when seen today.  No new concern  Objective: Vitals:   12/02/23 1030 12/02/23 1227  BP: 100/62 (!) 107/59  Pulse: 98 (!) 108  Resp:  18  Temp: 98.3 F (36.8 C) 99.1 F (37.3 C)  SpO2: 100% 97%    Intake/Output Summary (Last 24 hours) at 12/02/2023 1321 Last data filed at 12/01/2023 1600 Gross per 24 hour  Intake 252.5 ml  Output --  Net 252.5 ml    Filed Weights   11/19/23 0500 11/23/23 0500 11/27/23 1118  Weight: 56.1 kg 58.1 kg 55.9 kg   Body mass index is 22.55 kg/m.  Physical Exam:  General.  Frail and malnourished lady, in no acute distress. Pulmonary.  Lungs clear bilaterally, normal respiratory effort. CV.  Regular rate and rhythm, no JVD, rub or murmur. Abdomen.  Soft, nontender, mildly distended, BS positive. CNS.  Sleeping but arousable.  No focal neurologic deficit. Extremities.  No edema, no cyanosis, pulses intact and  symmetrical.  Data Review: I have personally reviewed the following laboratory data and studies,  CBC: Recent Labs  Lab 11/26/23 0227 11/28/23 0419 11/29/23 0350 11/30/23 0205 12/01/23 0551  WBC 4.8 3.7* 5.0 5.7 6.3  HGB 8.5* 8.4* 9.0* 8.6* 8.9*  HCT 24.5* 24.1* 26.2* 25.0* 25.8*  MCV 97.6 96.8 98.5 97.7 97.0  PLT 98* 129* 134* 128* 137*   Basic Metabolic Panel: Recent Labs  Lab 11/26/23 0227 11/28/23 0419 11/29/23 0350 11/30/23 0205 12/01/23 0551  NA 124* 131* 130* 126* 126*  K 4.5 2.8* 3.1* 4.1 4.2  CL 95* 96* 96* 97* 92*  CO2 20* 24 22 21* 23  GLUCOSE 97 102* 90 81 92  BUN 20 23* 16 18 20   CREATININE 0.55 0.58 0.51 0.59 0.59  CALCIUM  8.1* 8.1* 8.0* 8.2* 8.5*  MG 1.8 1.4* 1.7 1.6* 1.7  PHOS  --  3.9 3.9 3.9  --    Liver Function Tests: Recent Labs  Lab 11/28/23 0419 12/01/23 0551  AST 73* 78*  ALT 28 30  ALKPHOS 58 59  BILITOT 1.6* 2.2*  PROT 6.6 7.6  ALBUMIN  2.2* 2.4*   No results for input(s): LIPASE, AMYLASE in the last 168 hours. No results for input(s): AMMONIA in the last 168 hours. Cardiac Enzymes: No results for input(s): CKTOTAL, CKMB, CKMBINDEX, TROPONINI in the last 168 hours. BNP (last 3 results) No results for input(s): BNP in the last 8760 hours.  ProBNP (last 3 results) No results for input(s): PROBNP in the last 8760 hours.  CBG: No results for input(s): GLUCAP in the last 168 hours. No results found for this or any previous visit (from the past 240 hours).    Studies: US  Paracentesis Result Date: 12/01/2023 INDICATION: Patient with history of decompensated alcoholic cirrhosis, recurrent ascites. Request for therapeutic paracentesis. EXAM: ULTRASOUND GUIDED THERAPEUTIC PARACENTESIS MEDICATIONS: 8 mL 1% lidocaine  COMPLICATIONS: None immediate. PROCEDURE: Informed written consent was obtained from the patient after a discussion of the risks, benefits and alternatives to treatment. A timeout was performed prior to  the initiation of the procedure. Initial ultrasound scanning demonstrates a large amount of ascites within the right lower abdominal quadrant. The right lower abdomen was prepped and draped in the usual sterile fashion. 1% lidocaine  was used for local anesthesia. Following this, a 19 gauge, 7-cm, Yueh catheter was introduced. An ultrasound image was saved for documentation purposes. The paracentesis was performed. The catheter was removed and a dressing was applied. The patient tolerated the procedure well without immediate post procedural complication. FINDINGS: A total of approximately 4.4 L of clear yellow fluid was removed. IMPRESSION: Successful ultrasound-guided paracentesis yielding 4.4 liters of peritoneal fluid. Performed by Clotilda Hesselbach, PA-C PLAN: The patient has required >/=2 paracenteses in a 30 day period and a formal evaluation by the [facility] has been arranged. Electronically Signed   By: Juliene Balder M.D.   On: 12/01/2023 19:14    This record has been created using Conservation officer, historic buildings. Errors have been sought and corrected,but may not always be located. Such creation errors do not reflect on the standard of care.  Joan Dare, MD  [facility] 12/02/2023  If 7PM-7AM, please contact night-coverage

## 2023-12-03 DIAGNOSIS — K2289 Other specified disease of esophagus: Secondary | ICD-10-CM | POA: Diagnosis not present

## 2023-12-03 DIAGNOSIS — K7460 Unspecified cirrhosis of liver: Secondary | ICD-10-CM | POA: Diagnosis not present

## 2023-12-03 DIAGNOSIS — F1090 Alcohol use, unspecified, uncomplicated: Secondary | ICD-10-CM | POA: Diagnosis not present

## 2023-12-03 DIAGNOSIS — K922 Gastrointestinal hemorrhage, unspecified: Secondary | ICD-10-CM | POA: Diagnosis not present

## 2023-12-03 DIAGNOSIS — R197 Diarrhea, unspecified: Secondary | ICD-10-CM | POA: Diagnosis not present

## 2023-12-03 DIAGNOSIS — D62 Acute posthemorrhagic anemia: Secondary | ICD-10-CM | POA: Diagnosis not present

## 2023-12-03 DIAGNOSIS — E43 Unspecified severe protein-calorie malnutrition: Secondary | ICD-10-CM | POA: Diagnosis not present

## 2023-12-03 DIAGNOSIS — K7290 Hepatic failure, unspecified without coma: Secondary | ICD-10-CM | POA: Diagnosis not present

## 2023-12-03 DIAGNOSIS — R112 Nausea with vomiting, unspecified: Secondary | ICD-10-CM | POA: Diagnosis not present

## 2023-12-03 NOTE — Plan of Care (Signed)
 Ambulate independently in the hallway. Abdomen remain distended but denies being uncomfortable.  Problem: Health Behavior/Discharge Planning: Goal: Ability to manage health-related needs will improve Outcome: Progressing   Problem: Nutrition: Goal: Adequate nutrition will be maintained Outcome: Progressing   Problem: Coping: Goal: Level of anxiety will decrease Outcome: Progressing   Problem: Skin Integrity: Goal: Risk for impaired skin integrity will decrease Outcome: Progressing   Problem: Safety: Goal: Ability to remain free from injury will improve Outcome: Progressing   Problem: Pain Managment: Goal: General experience of comfort will improve and/or be controlled Outcome: Progressing

## 2023-12-03 NOTE — Plan of Care (Signed)

## 2023-12-03 NOTE — Progress Notes (Signed)
 PROGRESS NOTE  Joan Wood FMW:[id number] DOB: 09-30-1970 DOA: 10/03/2023 PCP: Edman Meade PEDLAR, FNP   LOS: 61 days   Brief narrative:  53 y.o. female with a history of decompensated liver cirrhosis, PUD, gastric ulcer perforation status post ex lap, GERD, pancreatitis, obstructive sleep apnea presented to hospital secondary to nausea, coffee-ground emesis, bloody diarrhea with concern for upper GI bleed. 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 Hospitalization was complicated by hepatic encephalopathy treated with lactulose  and rifaximin  followed by lactulose  alone. Patient also requiring recurrent paracenteses for her recurrent ascites.  Currently patient has remained stable is awaiting for placement.  Has had a prolonged stay in the hospital due to lack of placement and unsafe home situation.  11/22: Had paracentesis with removal of 4.4 L of fluid.  PleurX catheter placement was deferred by IR unless she has someone to take care of it.  Will need to reconsult when that sorted out.  11/24: Medically stable, slightly worsening abdominal distention.  Assessment/Plan: Principal Problem:   Decompensated cirrhosis (HCC) Active Problems:   GI bleed   Liver cirrhosis (HCC)   Esophageal necrosis   Acute appendicitis   Goals of care, counseling/discussion   Alcohol  use disorder   Hyponatremia   Tobacco abuse   Nausea vomiting and diarrhea   Acute on chronic blood loss anemia   ABLA (acute blood loss anemia)   AKI (acute kidney injury)   Pressure injury of skin   Protein-calorie malnutrition, severe   Decompensated alcoholic cirrhosis with recurrent ascites Underwent therapeutic paracentesis on 11/12/2023 with removal of 750 cc of ascitic fluid.  Ascitic fluid was not consistent with SBP.  Received IV Rocephin  for UTI.  Patient was  initially treated with rifaximin  and lactulose  for encephalopathy.  At this time, only on lactulose  has been continued since rifaximin  will not be covered at the outpatient facility.  Patient underwent  ultrasound-guided paracentesis on 11/24/2023 again with removal of 3.6 L of fluid and will be considered for repeat paracentesis today.  I did speak with interventional radiology about potential peritoneal drain placement since she had it in the past but given her current circumstances and need for follow-up and management of drains, it is unlikely that patient will be able to do unless she has assistance at home, or goes to skilled nursing facility and somebody is there to manage her peritoneal drain.  Patient does have impaired memory and understanding of the situation.  Spoke about fluid restriction. 11/22: Another paracentesis with removal of 4.4 L of fluid Patient need someone to take care of PleurX catheter before inserting it per IR  Hepatic encephalopathy Improved.  Continue lactulose  to keep bowel movements 3-4 times a day.  States that she has been having good bowel movements.   Enterobacter UTI. Treated with Bactrim  DS twice daily and has completed the course  Hypokalemia Latest potassium of 4.2.  Will monitor periodically.  Hypomagnesemia.  Magnesium  level still low at 1.7 after replacement.  Will repeat 2 g of magnesium  sulfate again today.  Hypoalbuminemia Secondary to underlying liver disease.  Latest albumin  of 2.2.  Causing peripheral edema and recurrent ascites.   Thrombocytopenia Secondary to liver disease.  Latest CBC showed hemoglobin of 9.0 and stable and Platelet of 134 and improving.   Acute kidney injury Presumed secondary to ATN from contrast. Nephrology was consulted and renal function has improved also.  Acute appendicitis General surgery was consulted and patient was thought to be high surgical risk so was treated conservatively with antibiotics.  No further  abdominal pain.  Chronic hypotension Has received albumin  intermittently.  Currently on midodrine  20 milligram 3 times daily.  Latest blood pressure of 111/64 and has remained stable.  Chronic hyponatremia Patient's baseline appears to be around 125 -128.  Latest sodium of 126.  Nephrology also saw the patient during hospitalization.   Acute blood loss anemia secondary to acute upper GI bleed from esophagitis and esophageal necrosis Patient was seen by GI during hospitalization and underwent upper GI endoscopy on 10/04/2023 with findings of grade D esophagitis in addition to concern for acute esophageal necrosis. Non-bleeding gastric ulcer noted GI recommended Carafate  for 2 weeks and Protonix  twice daily with outpatient GI follow-up.  Patient received 4 units of packed RBC during this hospitalization.   Latest hemoglobin of 8.9 and has remained stable.  Will transfuse if necessary.    Lower extremity edema, right ankle tenderness. Peripheral neuropathy Secondary to hypoalbuminemia from liver disease.  Duplex ultrasound of the lower extremity negative for DVT.   x-ray of the right ankle without any bony issues.  Gabapentin  dose has been increased from 300 milligram to 600 mg 3 times daily for the last few days and has been tolerating.  Will need to closely monitor for sedation..  Continue Lasix  40 mg twice daily.  Slightly elevated uric acid level.  Received colchicine  0.6 mg daily for few days.  Currently off colchicine .  Tobacco abuse Counseling done   History of alcohol  abuse No active issues.  Counseling done.  Continue multivitamin and thiamine .  No signs of withdrawal.   Severe protein malnutrition Nutrition Status: Body mass index is 22.62 kg/m.  Nutrition Problem: Severe Malnutrition Etiology: chronic illness (cirrhosis) Signs/Symptoms: severe fat depletion, severe muscle depletion Interventions: Ensure Enlive (each supplement provides 350kcal and 20 grams of protein), MVI,  Liberalize Diet, Magic cup Continue nutritional supplement.  Continue multivitamin thiamine  and folic acid .   Pressure injury sacrum stage II pressure Wound 10/15/23 0900 Pressure Injury Sacrum Mid Stage 2 -  Partial thickness loss of dermis presenting as a shallow open injury with a red, pink wound bed without slough. (Active)  Unclear if present on admission but continue wound care.   Goals of care Plan for skilled nursing facility for rehabitation.  TOC on board.  DVT prophylaxis: SCDs Start: 10/03/23 2153   Disposition: Awaiting for disposition likely to skilled nursing facility.  Barriers include unsafe home situation, inadequate or no insurance,  Status is: Inpatient  Remains inpatient appropriate because: , need for skilled nursing facility placement    Code Status:     Code Status: Full Code  Family Communication: Discussed with patient  Consultants: GI Interventional radiology Palliative care General surgery Nephrology  Procedures: Paracentesis on 11/19/2023, 11/24/2023, 11/22   Anti-infectives:  None currently  Subjective: Patient was sitting comfortably at the side of the bed when seen today.  No new concern.  Objective: Vitals:   12/03/23 0817 12/03/23 1146  BP: 110/66 (!) 104/59  Pulse: 100 96  Resp:    Temp: 98.6 F (37 C) 98.7 F (37.1 C)  SpO2: 100% 99%    Intake/Output Summary (Last 24 hours) at 12/03/2023 1437 Last data filed at 12/03/2023 1115 Gross per 24 hour  Intake 797 ml  Output --  Net 797 ml    Filed Weights   11/19/23 0500 11/23/23 0500 11/27/23 1118  Weight: 56.1  kg 58.1 kg 55.9 kg   Body mass index is 22.55 kg/m.   Physical Exam:  General.  Malnourished lady, in no acute distress. Pulmonary.  Lungs clear bilaterally, normal respiratory effort. CV.  Regular rate and rhythm, no JVD, rub or murmur. Abdomen.  Soft, nontender, mildly distended , BS positive. CNS.  Alert and oriented .  No focal neurologic  deficit. Extremities.  2+ LE edema,  pulses intact and symmetrical.  Data Review: I have personally reviewed the following laboratory data and studies,  CBC: Recent Labs  Lab 11/28/23 0419 11/29/23 0350 11/30/23 0205 12/01/23 0551  WBC 3.7* 5.0 5.7 6.3  HGB 8.4* 9.0* 8.6* 8.9*  HCT 24.1* 26.2* 25.0* 25.8*  MCV 96.8 98.5 97.7 97.0  PLT 129* 134* 128* 137*   Basic Metabolic Panel: Recent Labs  Lab 11/28/23 0419 11/29/23 0350 11/30/23 0205 12/01/23 0551  NA 131* 130* 126* 126*  K 2.8* 3.1* 4.1 4.2  CL 96* 96* 97* 92*  CO2 24 22 21* 23  GLUCOSE 102* 90 81 92  BUN 23* 16 18 20   CREATININE 0.58 0.51 0.59 0.59  CALCIUM  8.1* 8.0* 8.2* 8.5*  MG 1.4* 1.7 1.6* 1.7  PHOS 3.9 3.9 3.9  --    Liver Function Tests: Recent Labs  Lab 11/28/23 0419 12/01/23 0551  AST 73* 78*  ALT 28 30  ALKPHOS 58 59  BILITOT 1.6* 2.2*  PROT 6.6 7.6  ALBUMIN  2.2* 2.4*   No results for input(s): LIPASE, AMYLASE in the last 168 hours. No results for input(s): AMMONIA in the last 168 hours. Cardiac Enzymes: No results for input(s): CKTOTAL, CKMB, CKMBINDEX, TROPONINI in the last 168 hours. BNP (last 3 results) No results for input(s): BNP in the last 8760 hours.  ProBNP (last 3 results) No results for input(s): PROBNP in the last 8760 hours.  CBG: No results for input(s): GLUCAP in the last 168 hours. No results found for this or any previous visit (from the past 240 hours).    Studies: No results found.   This record has been created using Conservation officer, historic buildings. Errors have been sought and corrected,but may not always be located. Such creation errors do not reflect on the standard of care.    Amaryllis Dare, MD  [facility] 12/03/2023  If 7PM-7AM, please contact night-coverage

## 2023-12-04 DIAGNOSIS — K7290 Hepatic failure, unspecified without coma: Secondary | ICD-10-CM | POA: Diagnosis not present

## 2023-12-04 DIAGNOSIS — K2289 Other specified disease of esophagus: Secondary | ICD-10-CM | POA: Diagnosis not present

## 2023-12-04 DIAGNOSIS — K922 Gastrointestinal hemorrhage, unspecified: Secondary | ICD-10-CM | POA: Diagnosis not present

## 2023-12-04 DIAGNOSIS — F1090 Alcohol use, unspecified, uncomplicated: Secondary | ICD-10-CM | POA: Diagnosis not present

## 2023-12-04 DIAGNOSIS — R112 Nausea with vomiting, unspecified: Secondary | ICD-10-CM | POA: Diagnosis not present

## 2023-12-04 DIAGNOSIS — K7460 Unspecified cirrhosis of liver: Secondary | ICD-10-CM | POA: Diagnosis not present

## 2023-12-04 DIAGNOSIS — D62 Acute posthemorrhagic anemia: Secondary | ICD-10-CM | POA: Diagnosis not present

## 2023-12-04 NOTE — Progress Notes (Signed)
 Pt expresses desire to go home, and that she will have friends/family available to help her w/ drain if needed - pt wanted to have this noted in chart, thank you

## 2023-12-04 NOTE — Progress Notes (Signed)
 Occupational Therapy Treatment Patient Details Name: Joan Wood MRN: [id number] DOB: 1970-05-18 Today's Date: 12/04/2023   History of present illness 53 y.o. female presents to [facility] hospital on 10/03/2023 with nausea/vomiting and bloody diarrhea. Admitted with decompensated alcoholic cirrhosis w/ ascites and hepatic encephalopathy. 9/25 Upper EGD concerning for possible acute esophageal necrosis, portal hypertension, non-bleeding gastric and duodenal ulcers. Pt also underwent paracentesis on 9/25, 10/6, 10/22. PMH includes decompensated cirrhosis, PUD, GERD, pancreatitis, OSA.   OT comments  Patient continues to demonstrate good gains with OT treatment. Patient seated on EOB upon entry, fully dressed and patient stating she dressed herself.  Patient performed mobility to bathroom and transfer to toilet with supervision and no AD.  Patient performed mobility in hallway to address path finding tasks with min verbal cues to navigate back to room.  Patient's breakfast arrived during session and patient asked to stop to eat breakfast.  Patient will benefit from continued inpatient follow up therapy, <3 hours/day.  Acute OT to continue to follow to address established goals to facilitate DC to next venue of care.        If plan is discharge home, recommend the following:  A little help with bathing/dressing/bathroom;Direct supervision/assist for medications management;Direct supervision/assist for financial management;Assist for transportation;Supervision due to cognitive status   Equipment Recommendations  BSC/3in1    Recommendations for Other Services      Precautions / Restrictions Precautions Precautions: Fall Recall of Precautions/Restrictions: Impaired Precaution/Restrictions Comments: history of orthostasis Restrictions Weight Bearing Restrictions Per Provider Order: No       Mobility Bed Mobility Overal bed mobility: Needs Assistance             General bed mobility comments:  sitting on EOB upon entry and left on EOB with breakfast at end of session    Transfers Overall transfer level: Needs assistance Equipment used: None Transfers: Sit to/from Stand Sit to Stand: Supervision           General transfer comment: Supervision for safety     Balance Overall balance assessment: Needs assistance Sitting-balance support: No upper extremity supported, Feet supported Sitting balance-Leahy Scale: Good Sitting balance - Comments: seated EOB   Standing balance support: During functional activity, No upper extremity supported Standing balance-Leahy Scale: Fair Standing balance comment: CGA to supervision for stability                           ADL either performed or assessed with clinical judgement   ADL Overall ADL's : Needs assistance/impaired Eating/Feeding: Set up;Sitting   Grooming: Wash/dry hands;Wash/dry face;Supervision/safety;Standing Grooming Details (indicate cue type and reason): at sink                 Toilet Transfer: Supervision/safety;Ambulation;Regular Teacher, Adult Education Details (indicate cue type and reason): ambulated to bathroom without assistive device and supervision Toileting- Clothing Manipulation and Hygiene: Supervision/safety;Sit to/from stand         General ADL Comments: patient completely dressed upon entry and states she dressed herself    Extremity/Trunk Assessment              Vision       Perception     Praxis     Communication Communication Communication: No apparent difficulties   Cognition Arousal: Alert Behavior During Therapy: WFL for tasks assessed/performed Cognition: Cognition impaired     Awareness: Intellectual awareness impaired Memory impairment (select all impairments): Short-term memory, Working memory Attention impairment (select first level of  impairment): Sustained attention, Selective attention Executive functioning impairment (select all impairments):  Organization, Reasoning, Problem solving, Initiation                   Following commands: Impaired Following commands impaired: Follows multi-step commands inconsistently      Cueing   Cueing Techniques: Verbal cues, Visual cues  Exercises      Shoulder Instructions       General Comments performed path finding tasks with min verbal cues to locate room    Pertinent Vitals/ Pain       Pain Assessment Pain Assessment: No/denies pain  Home Living                                          Prior Functioning/Environment              Frequency  Min 2X/week        Progress Toward Goals  OT Goals(current goals can now be found in the care plan section)  Progress towards OT goals: Progressing toward goals  Acute Rehab OT Goals Patient Stated Goal: go home OT Goal Formulation: With patient Time For Goal Achievement: 12/05/23 Potential to Achieve Goals: Fair ADL Goals Pt Will Perform Grooming: with supervision;standing Pt Will Perform Lower Body Dressing: with supervision;sitting/lateral leans;sit to/from stand;with adaptive equipment Pt Will Transfer to Toilet: with supervision;ambulating;regular height toilet;grab bars Pt Will Perform Toileting - Clothing Manipulation and hygiene: with supervision;sitting/lateral leans;sit to/from stand Pt/caregiver will Perform Home Exercise Program: Both right and left upper extremity;With written HEP provided;With theraband  Plan      Co-evaluation                 AM-PAC OT 6 Clicks Daily Activity     Outcome Measure   Help from another person eating meals?: None Help from another person taking care of personal grooming?: A Little Help from another person toileting, which includes using toliet, bedpan, or urinal?: A Little Help from another person bathing (including washing, rinsing, drying)?: A Little Help from another person to put on and taking off regular upper body clothing?: A  Little Help from another person to put on and taking off regular lower body clothing?: A Little 6 Click Score: 19    End of Session Equipment Utilized During Treatment: Gait belt  OT Visit Diagnosis: Other abnormalities of gait and mobility (R26.89);Muscle weakness (generalized) (M62.81);Pain;Other symptoms and signs involving cognitive function   Activity Tolerance Patient tolerated treatment well   Patient Left in chair;with call bell/phone within reach;Other (comment) (seated on EOB)   Nurse Communication Mobility status        Time: 9285-9267 OT Time Calculation (min): 18 min  Charges: OT General Charges $OT Visit: 1 Visit OT Treatments $Self Care/Home Management : 8-22 mins  Dick Laine, OTA Acute Rehabilitation Services  Office [phone]   Jeb LITTIE Laine 12/04/2023, 9:39 AM

## 2023-12-04 NOTE — Plan of Care (Signed)
 Refused to wear SCDs because has almost fallen twice today trying to get them off to get to the bathroom, as is receiving lasix  and lactulose .  [place] she does not want to wear them for fear of falling.  I did reach out to Dr Caleen to notify her and explained to pt the importance of wearing them and what they are for.  Also explained that she could end up with a DVT or clot.  She said she understood and she does walk in the room and in the hallway several times a day.  Dr did not want to DC them but said to maybe try them at night.    Problem: Activity: Goal: Risk for activity intolerance will decrease Outcome: Progressing   Problem: Nutrition: Goal: Adequate nutrition will be maintained Outcome: Progressing   Problem: Coping: Goal: Level of anxiety will decrease Outcome: Progressing   Problem: Elimination: Goal: Will not experience complications related to bowel motility Outcome: Progressing

## 2023-12-04 NOTE — Progress Notes (Signed)
 PROGRESS NOTE  Joan Wood FMW:[id number] DOB: [birth year] DOA: 10/03/2023 PCP: Edman Meade PEDLAR, FNP   LOS: 62 days   Brief narrative:  53 y.o. female with a history of decompensated liver cirrhosis, PUD, gastric ulcer perforation status post ex lap, GERD, pancreatitis, obstructive sleep apnea presented to hospital secondary to nausea, coffee-ground emesis, bloody diarrhea with concern for upper GI bleed. 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 Hospitalization was complicated by hepatic encephalopathy treated with lactulose  and rifaximin  followed by lactulose  alone. Patient also requiring recurrent paracenteses for her recurrent ascites.  Currently patient has remained stable is awaiting for placement.  Has had a prolonged stay in the hospital due to lack of placement and unsafe home situation.  11/22: Had paracentesis with removal of 4.4 L of fluid.  PleurX catheter placement was deferred by IR unless she has someone to take care of it.  Will need to reconsult when that sorted out.  11/24: Medically stable, slightly worsening abdominal distention.  11/25: Medically stable, wants to go home stating that my son will take care of me.  Unable to reach son.  Pending SNF placement.  Assessment/Plan: Principal Problem:   Decompensated cirrhosis (HCC) Active Problems:   GI bleed   Liver cirrhosis (HCC)   Esophageal necrosis   Acute appendicitis   Goals of care, counseling/discussion   Alcohol  use disorder   Hyponatremia   Tobacco abuse   Nausea vomiting and diarrhea   Acute on chronic blood loss anemia   ABLA (acute blood loss anemia)   AKI (acute kidney injury)   Pressure injury of skin   Protein-calorie malnutrition, severe   Decompensated alcoholic cirrhosis with recurrent ascites Underwent therapeutic paracentesis on  11/12/2023 with removal of 750 cc of ascitic fluid.  Ascitic fluid was not consistent with SBP.  Received IV Rocephin  for UTI.  Patient was initially treated with rifaximin  and lactulose  for encephalopathy.  At this time, only on lactulose  has been continued since rifaximin  will not be covered at the outpatient facility.  Patient underwent  ultrasound-guided paracentesis on 11/24/2023 again with removal of 3.6 L of fluid and will be considered for repeat paracentesis today.  I did speak with interventional radiology about potential peritoneal drain placement since she had it in the past but given her current circumstances and need for follow-up and management of drains, it is unlikely that patient will be able to do unless she has assistance at home, or goes to skilled nursing facility and somebody is there to manage her peritoneal drain.  Patient does have impaired memory and understanding of the situation.  Spoke about fluid restriction. 11/22: Another paracentesis with removal of 4.4 L of fluid Patient need someone to take care of PleurX catheter before inserting it per IR  Hepatic encephalopathy Improved.  Continue lactulose  to keep bowel movements 3-4 times a day.  States that she has been having good bowel movements.   Enterobacter UTI. Treated with Bactrim  DS twice daily and has completed the course  Hypokalemia Latest potassium of 4.2.  Will monitor periodically.  Hypomagnesemia.  Magnesium  level still low at 1.7 after replacement.  Will repeat 2 g of magnesium  sulfate again today.  Hypoalbuminemia Secondary to underlying liver disease.  Latest albumin  of 2.2.  Causing peripheral edema and recurrent ascites.   Thrombocytopenia Secondary to liver disease.  Latest CBC showed hemoglobin of 9.0 and stable and Platelet of  134 and improving.   Acute kidney injury Presumed secondary to ATN from contrast. Nephrology was consulted and renal function has improved also.    Acute  appendicitis General surgery was consulted and patient was thought to be high surgical risk so was treated conservatively with antibiotics.  No further abdominal pain.  Chronic hypotension Has received albumin  intermittently.  Currently on midodrine  20 milligram 3 times daily.  Latest blood pressure of 111/64 and has remained stable.  Chronic hyponatremia Patient's baseline appears to be around 125 -128.  Latest sodium of 126.  Nephrology also saw the patient during hospitalization.   Acute blood loss anemia secondary to acute upper GI bleed from esophagitis and esophageal necrosis Patient was seen by GI during hospitalization and underwent upper GI endoscopy on 10/04/2023 with findings of grade D esophagitis in addition to concern for acute esophageal necrosis. Non-bleeding gastric ulcer noted GI recommended Carafate  for 2 weeks and Protonix  twice daily with outpatient GI follow-up.  Patient received 4 units of packed RBC during this hospitalization.   Latest hemoglobin of 8.9 and has remained stable.  Will transfuse if necessary.    Lower extremity edema, right ankle tenderness. Peripheral neuropathy Secondary to hypoalbuminemia from liver disease.  Duplex ultrasound of the lower extremity negative for DVT.   x-ray of the right ankle without any bony issues.  Gabapentin  dose has been increased from 300 milligram to 600 mg 3 times daily for the last few days and has been tolerating.  Will need to closely monitor for sedation..  Continue Lasix  40 mg twice daily.  Slightly elevated uric acid level.  Received colchicine  0.6 mg daily for few days.  Currently off colchicine .  Tobacco abuse Counseling done   History of alcohol  abuse No active issues.  Counseling done.  Continue multivitamin and thiamine .  No signs of withdrawal.   Severe protein malnutrition Nutrition Status: Body mass index is 22.62 kg/m.  Nutrition Problem: Severe Malnutrition Etiology: chronic illness  (cirrhosis) Signs/Symptoms: severe fat depletion, severe muscle depletion Interventions: Ensure Enlive (each supplement provides 350kcal and 20 grams of protein), MVI, Liberalize Diet, Magic cup Continue nutritional supplement.  Continue multivitamin thiamine  and folic acid .   Pressure injury sacrum stage II pressure Wound 10/15/23 0900 Pressure Injury Sacrum Mid Stage 2 -  Partial thickness loss of dermis presenting as a shallow open injury with a red, pink wound bed without slough. (Active)  Unclear if present on admission but continue wound care.   Goals of care Plan for skilled nursing facility for rehabitation.  TOC on board.  DVT prophylaxis: SCDs Start: 10/03/23 2153   Disposition: Awaiting for disposition likely to skilled nursing facility.  Barriers include unsafe home situation, inadequate or no insurance,  Status is: Inpatient  Remains inpatient appropriate because: , need for skilled nursing facility placement    Code Status:     Code Status: Full Code  Family Communication: Discussed with patient, unable to reach son  Consultants: GI Interventional radiology Palliative care General surgery Nephrology  Procedures: Paracentesis on 11/19/2023, 11/24/2023, 11/22   Anti-infectives:  None currently  Subjective: Patient was seen and examined today.  She wants to go home stating that she talked with her son and he will be able to help her manage her ascites and can bring for fluid removal.  Objective: Vitals:   12/04/23 0747 12/04/23 1100  BP: 119/77 114/79  Pulse: (!) 105 98  Resp: 16 18  Temp: 98.6 F (37 C) 98.6 F (37 C)  SpO2: 100%  100%    Intake/Output Summary (Last 24 hours) at 12/04/2023 1504 Last data filed at 12/04/2023 0343 Gross per 24 hour  Intake 630 ml  Output --  Net 630 ml    Filed Weights   11/19/23 0500 11/23/23 0500 11/27/23 1118  Weight: 56.1 kg 58.1 kg 55.9 kg   Body mass index is 22.55 kg/m.   Physical  Exam:  General.  Frail and malnourished lady, in no acute distress. Pulmonary.  Lungs clear bilaterally, normal respiratory effort. CV.  Regular rate and rhythm, no JVD, rub or murmur. Abdomen.  Soft, nontender, distended, BS positive. CNS.  Alert and oriented .  No focal neurologic deficit. Extremities.  2+ LE edema, no cyanosis, pulses intact and symmetrical.  Data Review: I have personally reviewed the following laboratory data and studies,  CBC: Recent Labs  Lab 11/28/23 0419 11/29/23 0350 11/30/23 0205 12/01/23 0551  WBC 3.7* 5.0 5.7 6.3  HGB 8.4* 9.0* 8.6* 8.9*  HCT 24.1* 26.2* 25.0* 25.8*  MCV 96.8 98.5 97.7 97.0  PLT 129* 134* 128* 137*   Basic Metabolic Panel: Recent Labs  Lab 11/28/23 0419 11/29/23 0350 11/30/23 0205 12/01/23 0551  NA 131* 130* 126* 126*  K 2.8* 3.1* 4.1 4.2  CL 96* 96* 97* 92*  CO2 24 22 21* 23  GLUCOSE 102* 90 81 92  BUN 23* 16 18 20   CREATININE 0.58 0.51 0.59 0.59  CALCIUM  8.1* 8.0* 8.2* 8.5*  MG 1.4* 1.7 1.6* 1.7  PHOS 3.9 3.9 3.9  --    Liver Function Tests: Recent Labs  Lab 11/28/23 0419 12/01/23 0551  AST 73* 78*  ALT 28 30  ALKPHOS 58 59  BILITOT 1.6* 2.2*  PROT 6.6 7.6  ALBUMIN  2.2* 2.4*   No results for input(s): LIPASE, AMYLASE in the last 168 hours. No results for input(s): AMMONIA in the last 168 hours. Cardiac Enzymes: No results for input(s): CKTOTAL, CKMB, CKMBINDEX, TROPONINI in the last 168 hours. BNP (last 3 results) No results for input(s): BNP in the last 8760 hours.  ProBNP (last 3 results) No results for input(s): PROBNP in the last 8760 hours.  CBG: No results for input(s): GLUCAP in the last 168 hours. No results found for this or any previous visit (from the past 240 hours).    Studies: No results found.   This record has been created using Conservation officer, historic buildings. Errors have been sought and corrected,but may not always be located. Such creation errors do not  reflect on the standard of care.    Amaryllis Dare, MD  [facility] 12/04/2023  If 7PM-7AM, please contact night-coverage

## 2023-12-04 NOTE — Progress Notes (Addendum)
 Physical Therapy Treatment Patient Details Name: Joan Wood MRN: [id number] DOB: [birth year] Today's Date: 12/04/2023   History of Present Illness 53 y.o. female admitted 10/03/2023 with nausea/vomiting and bloody diarrhea. Workup for decompensated alcoholic cirrhosis, ascites, hepatic encephalopathy. S/p upper EGD 9/25 concerning for possible acute esophageal necrosis, portal hypertension, non-bleeding gastric and duodenal ulcers. S/p recurrent paracentesis for recurrent ascites; IR deferred PleurX catheter placement due to social situation. PMH includes decompensated cirrhosis, PUD, GERD, pancreatitis, OSA, peripheral neuropathy, tobacco abuse, alcohol  abuse, severe protein malnutrition.   PT Comments  Pt progressing with mobility. Today's session focused on gait training without DME. Pt remains limited by generalized weakness, decreased activity tolerance, poor balance strategies/postural reactions and impaired cognition. Continue to recommend post-acute rehab to maximize functional mobility and independence.     If plan is discharge home, recommend the following: A little help with bathing/dressing/bathroom;Assistance with cooking/housework;Assist for transportation;Help with stairs or ramp for entrance;Direct supervision/assist for medications management;Direct supervision/assist for financial management;Supervision due to cognitive status   Can travel by private vehicle     Yes  Equipment Recommendations   (TBD - may benefit from rollator)    Recommendations for Other Services       Precautions / Restrictions Precautions Precautions: Fall Recall of Precautions/Restrictions: Impaired Precaution/Restrictions Comments: h/o orthostasis Restrictions Weight Bearing Restrictions Per Provider Order: No     Mobility  Bed Mobility Overal bed mobility: Independent Bed Mobility: Supine to Sit                Transfers Overall transfer level: Modified independent Equipment used:  None Transfers: Sit to/from Stand             General transfer comment: multiple sit<>stands from EOB, recliner and low toilet height (with grab bar) mod indep without DME    Ambulation/Gait Ambulation/Gait assistance: Supervision Gait Distance (Feet): 180 Feet Assistive device: None Gait Pattern/deviations: Step-through pattern, Decreased stride length, Drifts right/left Gait velocity: Decreased     General Gait Details: slowed gait speed with intermittent drifting R/L though no overt instability or LOB with head turns, sudden stops or direction changes in busy hallway; pt rushing back to room endorsing fatigue and needing rest break requiring cues for activity pacing   Stairs Stairs:  (pt declined secondary to fatigue)           Wheelchair Mobility     Tilt Bed    Modified Rankin (Stroke Patients Only)       Balance Overall balance assessment: Needs assistance Sitting-balance support: No upper extremity supported, Feet supported Sitting balance-Leahy Scale: Good     Standing balance support: During functional activity, No upper extremity supported Standing balance-Leahy Scale: Fair Standing balance comment: performing toileting, standing ADLs at sink and reaching for objects on tray/side tables without UE support, no LOB                            Communication Communication Communication: No apparent difficulties  Cognition Arousal: Alert Behavior During Therapy: WFL for tasks assessed/performed   PT - Cognitive impairments: Awareness, Problem solving, Safety/Judgement                       PT - Cognition Comments: decreased attention and tangential/verbose requiring intermittent redirection Following commands: Impaired Following commands impaired: Follows multi-step commands inconsistently    Cueing    Exercises Other Exercises Other Exercises: 10x repeated sit<>stands from recliner with initial BUE support progressing to  no  UE support, increased time and effort with increased quad fatigue on final trials    General Comments General comments (skin integrity, edema, etc.): portable pulse ox reading HR 168 immediately post-ambulation, manual radial pulse check with HR 120s; SpO2 98% on RA      Pertinent Vitals/Pain Pain Assessment Pain Assessment: No/denies pain Pain Intervention(s): Monitored during session    Home Living                          Prior Function            PT Goals (current goals can now be found in the care plan section) Acute Rehab PT Goals PT Goal Formulation: With patient Time For Goal Achievement: 01/01/24 Potential to Achieve Goals: Good Progress towards PT goals: Goals met and updated - see care plan    Frequency    Min 2X/week      PT Plan      Co-evaluation              AM-PAC PT 6 Clicks Mobility   Outcome Measure  Help needed turning from your back to your side while in a flat bed without using bedrails?: None Help needed moving from lying on your back to sitting on the side of a flat bed without using bedrails?: None Help needed moving to and from a bed to a chair (including a wheelchair)?: None Help needed standing up from a chair using your arms (e.g., wheelchair or bedside chair)?: None Help needed to walk in hospital room?: A Little Help needed climbing 3-5 steps with a railing? : A Little 6 Click Score: 22    End of Session   Activity Tolerance: Patient tolerated treatment well Patient left: in bed;with call bell/phone within reach;Other (comment) (pt received without bed alarm on, has been allowed to get up in room/hallway with RW) Nurse Communication: Mobility status PT Visit Diagnosis: Other abnormalities of gait and mobility (R26.89);Muscle weakness (generalized) (M62.81);Unsteadiness on feet (R26.81);Difficulty in walking, not elsewhere classified (R26.2)     Time: 8393-8366 PT Time Calculation (min) (ACUTE ONLY): 27  min  Charges:    $Gait Training: 8-22 mins $Therapeutic Exercise: 8-22 mins PT General Charges $$ ACUTE PT VISIT: 1 Visit                      Joan Wood, PT, DPT Acute Rehabilitation Services  Personal: Secure Chat Rehab Office: [phone]   Joan Wood 12/04/2023, 5:46 PM

## 2023-12-05 DIAGNOSIS — F1090 Alcohol use, unspecified, uncomplicated: Secondary | ICD-10-CM | POA: Diagnosis not present

## 2023-12-05 DIAGNOSIS — K922 Gastrointestinal hemorrhage, unspecified: Secondary | ICD-10-CM | POA: Diagnosis not present

## 2023-12-05 DIAGNOSIS — R112 Nausea with vomiting, unspecified: Secondary | ICD-10-CM | POA: Diagnosis not present

## 2023-12-05 DIAGNOSIS — N179 Acute kidney failure, unspecified: Secondary | ICD-10-CM | POA: Diagnosis not present

## 2023-12-05 DIAGNOSIS — R531 Weakness: Secondary | ICD-10-CM | POA: Diagnosis not present

## 2023-12-05 DIAGNOSIS — K7460 Unspecified cirrhosis of liver: Secondary | ICD-10-CM | POA: Diagnosis not present

## 2023-12-05 DIAGNOSIS — Z515 Encounter for palliative care: Secondary | ICD-10-CM | POA: Diagnosis not present

## 2023-12-05 DIAGNOSIS — E43 Unspecified severe protein-calorie malnutrition: Secondary | ICD-10-CM | POA: Diagnosis not present

## 2023-12-05 DIAGNOSIS — R197 Diarrhea, unspecified: Secondary | ICD-10-CM | POA: Diagnosis not present

## 2023-12-05 DIAGNOSIS — Z7189 Other specified counseling: Secondary | ICD-10-CM | POA: Diagnosis not present

## 2023-12-05 DIAGNOSIS — D62 Acute posthemorrhagic anemia: Secondary | ICD-10-CM | POA: Diagnosis not present

## 2023-12-05 DIAGNOSIS — K7290 Hepatic failure, unspecified without coma: Secondary | ICD-10-CM | POA: Diagnosis not present

## 2023-12-05 MED ORDER — TRIAMCINOLONE 0.1 % CREAM:EUCERIN CREAM 1:1
TOPICAL_CREAM | Freq: Every day | CUTANEOUS | Status: AC
  Administered 2023-12-20 – 2024-02-12 (×5): 1 via TOPICAL
  Filled 2023-12-05 (×4): qty 60

## 2023-12-05 NOTE — Plan of Care (Signed)
  Problem: Health Behavior/Discharge Planning: Goal: Ability to manage health-related needs will improve Outcome: Progressing   Problem: Clinical Measurements: Goal: Ability to maintain clinical measurements within normal limits will improve Outcome: Progressing   Problem: Activity: Goal: Risk for activity intolerance will decrease Outcome: Progressing   Problem: Nutrition: Goal: Adequate nutrition will be maintained Outcome: Progressing   Problem: Coping: Goal: Level of anxiety will decrease Outcome: Progressing   Problem: Elimination: Goal: Will not experience complications related to bowel motility Outcome: Progressing   Problem: Pain Managment: Goal: General experience of comfort will improve and/or be controlled Outcome: Progressing   Problem: Safety: Goal: Ability to remain free from injury will improve Outcome: Progressing

## 2023-12-05 NOTE — Progress Notes (Signed)
 Progress Note   Patient: Joan Wood FMW:[id number] DOB: 03-Oct-1970 DOA: 10/03/2023     63 DOS: the patient was seen and examined on 12/05/2023   Brief hospital course: 53 y.o. female with a history of decompensated liver cirrhosis, PUD, gastric ulcer perforation status post ex lap, GERD, pancreatitis, obstructive sleep apnea. Patient presented secondary to nausea, coffee-ground emesis, bloody diarrhea with concern for upper GI bleed. 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 Hospitalization complicated by hepatic encephalopathy treated with lactulose  and rifaximin . Patient also requiring recurrent paracenteses for her recurrent ascites.   Assessment and Plan: Decompensated alcoholic cirrhosis with recurrent ascites Underwent therapeutic paracentesis on 11/12/2023 with removal of 750 cc of ascitic fluid.  Ascitic fluid was not consistent with SBP.  Received IV Rocephin  for UTI.  Patient was initially treated with rifaximin  and lactulose  for encephalopathy.  At this time, only on lactulose  has been continued since rifaximin  will not be covered at the outpatient facility.  Patient underwent  ultrasound-guided paracentesis on 11/24/2023 again with removal of 3.6 L of fluid and will be considered for repeat paracentesis today.  Dr. Sonjia spoke with interventional radiology on 11/28/23 about potential peritoneal drain placement since she had it in the past but given her current circumstances and need for follow-up and management of drains, it is unlikely that patient will be able to do unless she has assistance at home, or goes to skilled nursing facility and somebody is there to manage her peritoneal drain.  Patient does have impaired memory and understanding of the situation. Patient need someone to take care of PleurX catheter before inserting it per  IR -Recent MELD of 35 with correlates to roughly 50% 3 month mortality. Discussed with pt, who became upset and was eager to discuss with Palliative Care. Re-consulted Palliative Care   Hepatic encephalopathy Improved.  Continue lactulose  to keep bowel movements 3-4 times a day.  States that she has been having good bowel movements.   Enterobacter UTI. Treated with Bactrim  DS twice daily and has completed the course   Hypokalemia Cont to monitor   Hypomagnesemia.   Cont to replace as needed   Hypoalbuminemia Secondary to underlying liver disease.  Causing peripheral edema and recurrent ascites.   Thrombocytopenia Secondary to liver disease.  Latest CBC showed hemoglobin of 9.0 and stable and Platelet of 134 and improving.   Acute kidney injury Presumed secondary to ATN from contrast. Nephrology was consulted and renal function has improved also.    Acute appendicitis General surgery was consulted and patient was thought to be high surgical risk so was treated conservatively with antibiotics.  No further abdominal pain.   Chronic hypotension Has received albumin  intermittently.  Currently on midodrine  20 milligram 3 times daily.  Latest blood pressure of 111/64 and has remained stable.   Chronic hyponatremia Patient's baseline appears to be around 125 -128.  Latest sodium of 126.  Nephrology also saw the patient during hospitalization.   Acute blood loss anemia secondary to acute upper GI bleed from esophagitis and esophageal necrosis Patient was seen by GI during hospitalization and underwent upper GI endoscopy on 10/04/2023 with findings of grade D esophagitis in addition to concern for acute esophageal necrosis. Non-bleeding gastric ulcer noted GI recommended Carafate  for 2 weeks and Protonix  twice daily with outpatient GI follow-up.  Patient received 4 units of packed RBC during this hospitalization.   Latest hemoglobin  of 8.9 and has remained stable.  Will transfuse if  necessary.    Lower extremity edema, right ankle tenderness. Peripheral neuropathy Secondary to hypoalbuminemia from liver disease.  Duplex ultrasound of the lower extremity negative for DVT.   x-ray of the right ankle without any bony issues.  Gabapentin  dose has been increased from 300 milligram to 600 mg 3 times daily for the last few days and has been tolerating.  Will need to closely monitor for sedation..  Continue Lasix  40 mg twice daily.  Slightly elevated uric acid level.  Received colchicine  0.6 mg daily for few days.  Currently off colchicine .   Tobacco abuse Counseling done   History of alcohol  abuse No active issues.  Counseling done.  Continue multivitamin and thiamine .  No signs of withdrawal.   Severe protein malnutrition Nutrition Status: Body mass index is 22.62 kg/m.  Nutrition Problem: Severe Malnutrition Etiology: chronic illness (cirrhosis) Signs/Symptoms: severe fat depletion, severe muscle depletion Interventions: Ensure Enlive (each supplement provides 350kcal and 20 grams of protein), MVI, Liberalize Diet, Magic cup Continue nutritional supplement.  Continue multivitamin thiamine  and folic acid .   Pressure injury sacrum stage II pressure Wound 10/15/23 0900 Pressure Injury Sacrum Mid Stage 2 -  Partial thickness loss of dermis presenting as a shallow open injury with a red, pink wound bed without slough. (Active)  Unclear if present on admission but continue wound care.    Goals of care Plan for skilled nursing facility for rehabitation.  Difficult to place -Palliative Care re-consulted to assist with continued goals of care      Subjective: Without complaints  Physical Exam: Vitals:   12/04/23 2348 12/05/23 0405 12/05/23 0825 12/05/23 1252  BP: 111/66 (!) 105/59 (!) 97/55 116/75  Pulse: 80 (!) 102 96 91  Resp: 18 18 18 18   Temp: 98.4 F (36.9 C) 98.2 F (36.8 C) 98.2 F (36.8 C) 97.8 F (36.6 C)  TempSrc:   Oral Oral  SpO2: 98% 100% 100% 100%   Weight:      Height:       General exam: Awake, laying in bed, in nad Respiratory system: Normal respiratory effort, no wheezing Cardiovascular system: regular rate, s1, s2 Gastrointestinal system: distended, positive BS Central nervous system: CN2-12 grossly intact, strength intact Extremities: Perfused, no clubbing Skin: Normal skin turgor, no notable skin lesions seen Psychiatry: Mood normal // no visual hallucinations   Data Reviewed:  There are no new results to review at this time.  Family Communication: Pt in room, family not at bedside  Disposition: Status is: Inpatient Remains inpatient appropriate because: severity of illness  Planned Discharge Destination: Skilled nursing facility    Author: Garnette Pelt, MD 12/05/2023 3:55 PM  For on call review [email or web address].

## 2023-12-05 NOTE — TOC Progression Note (Signed)
 Transition of Care [facility]) - Progression Note    Patient Details  Name: Joan Wood MRN: [id number] Date of Birth: Nov 28, 1970  Transition of Care [facility]) CM/SW Contact  [name], [name] Phone Number: 12/05/2023, 3:41 PM  Clinical Narrative:   CSW continuing to follow for disposition. No placement options at this time, brief update provided to patient's son. CSW to follow.    Expected Discharge Plan: Skilled Nursing Facility Barriers to Discharge: Homeless with medical needs, Inadequate or no insurance, Continued Medical Work up, Unsafe home situation               Expected Discharge Plan and Services In-house Referral: Clinical Social Work Discharge Planning Services: CM Consult Post Acute Care Choice: Home Health Living arrangements for the past 2 months: Single Family Home                 DME Arranged: Walker rolling   Date DME Agency Contacted: 10/08/23   Representative spoke with at DME Agency: [name]             Social Drivers of Health (SDOH) Interventions SDOH Screenings   Food Insecurity: No Food Insecurity (10/03/2023)  Housing: Low Risk  (10/03/2023)  Transportation Needs: No Transportation Needs (10/03/2023)  Recent Concern: Transportation Needs - Unmet Transportation Needs (08/20/2023)  Utilities: Not At Risk (10/03/2023)  Depression (PHQ2-9): High Risk (07/30/2023)  Social Connections: Socially Isolated (10/03/2023)  Tobacco Use: High Risk (10/04/2023)    Readmission Risk Interventions    10/04/2023    9:02 AM 09/03/2023   12:31 PM 09/02/2023    9:01 AM  Readmission Risk Prevention Plan  Transportation Screening Complete Complete Complete  PCP or Specialist Appt within 3-5 Days   Complete  HRI or Home Care Consult   Complete  Social Work Consult for Recovery Care Planning/Counseling   Complete  Palliative Care Screening   Not Applicable  Medication Review Oceanographer) Complete Complete Complete  HRI or Home Care Consult Complete  Complete   SW Recovery Care/Counseling Consult Complete Complete   Palliative Care Screening Not Applicable Not Applicable   Skilled Nursing Facility Not Applicable Patient Refused

## 2023-12-05 NOTE — Progress Notes (Addendum)
 EOS  On call contacted overnight re: Pt request pain relief cream for BLE - pt declines muscle cream d/t malodor  Pt unable to tolerate SCDs - pt gets tangled in them - pt is independent, up to bathroom frequently d/t lasix  and lactulose    -----------------  Per on call: Informed pt that she cannot have any different pain cream and that the MD put a consult in for the wound nurse to see her legs

## 2023-12-05 NOTE — Consult Note (Signed)
 Consultation Note Date: 12/05/2023   Patient Name: Joan Wood  DOB: 07-20-1970  MRN: [id number]  Age / Sex: 53 y.o., female  PCP: Edman Meade PEDLAR, FNP Referring Physician: Cindy Garnette POUR, MD  Reason for Consultation: Establishing goals of care  HPI/Patient Profile: 53 y.o. female   admitted on 10/03/2023 with  history of decompensated liver cirrhosis, PUD, gastric ulcer perforation status post ex lap, GERD, pancreatitis, obstructive sleep apnea. Patient presented secondary to nausea, coffee-ground emesis, bloody diarrhea with concern for upper GI bleed. 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 Hospitalization complicated by hepatic encephalopathy treated with lactulose  and rifaximin .   Patient also requiring recurrent paracenteses for her recurrent ascites.   Hospital day 62, complicated disposition.  TOC working for placement closer to her children in [place].     Patient and her family  face treatment option decision, advance directive decisions and anticipatory care needs.   Clinical Assessment and Goals of Care:  This NP Joan Wood reviewed medical records, received report from team, assessed the patient and then meet at the patient's bedside  to discuss diagnosis, prognosis, GOC, EOL wishes disposition and options.  Joan Wood is familiar with PMT   Concept of Palliative Care was re-introduced as specialized medical care for people and their families living with serious illness.  If focuses on providing relief from the symptoms and stress of a serious illness.  The goal is to improve quality of life for both the patient and the family.Values and goals of care important to patient and family were attempted to be elicited.  Conversation with Joan Wood is  interesting.   Facts are never accurately expressed  she is tangential.  Today there was a misunderstanding about her prognosis, She tells me she was told she has  2 days to live.  Prognosis was discussed and corrected.  I did however offer further education on her long-term poor prognosis secondary to her underlying comorbidities.     Education offered on hospice benefit.   I do worry that patient does not have insight into the seriousness of her current medical situation and her limited transition of care options..     I was able to speak to Joan Wood.   He is the person that she was living with prior to this admission.  Again today he expresses clearly that patient is unable to return to his home at any point in time.  I encouraged him to be very clear with [name] about his decision.  She continues to talk about the fact that she will be returning to his home.  She reports all her belongings are there and that in a court of law that means that is still her home    Questions and concerns addressed.  Patient  encouraged to call with questions or concerns.     PMT will continue to support holistically.     HCPOA/son    SUMMARY  OF RECOMMENDATIONS    Code Status/Advance Care Planning: Full code Educated patient to consider DNR/DNI status understanding evidenced based poor outcomes in similar hospitalized patient, as the cause of arrest is likely associated with advanced chronic illness rather than an easily reversible acute cardio-pulmonary event.   Symptom Management:  Per attending  Palliative Prophylaxis:  Bowel Regimen, Delirium Protocol, and Frequent Pain Assessment  Additional Recommendations (Limitations, Scope, Preferences): Patient remains open to all offered and available medical interventions to prolong life.  Psycho-social/Spiritual:   Additional Recommendations: Education on Hospice  Prognosis:  < 6 months  Discharge Planning: To Be Determined       Primary Diagnoses: Present on Admission:  GI bleed  Tobacco abuse  Nausea vomiting and diarrhea  Liver cirrhosis (HCC)  Hyponatremia  Alcohol  use disorder  Acute on chronic blood loss anemia   I have reviewed the medical record, interviewed the patient and family, and examined the patient. The following aspects are pertinent.  Past Medical History:  Diagnosis Date   Anxiety    Dysrhythmia    GERD (gastroesophageal reflux disease)    Hernia of abdominal wall    History of hiatal hernia    Sleep apnea    SVT (supraventricular tachycardia)    Social History   Socioeconomic History   Marital status: Single    Spouse name: Not on file   Number of children: 4   Years of education: Not on file   Highest education level: Not on file  Occupational History   Not on file  Tobacco Use   Smoking status: Every Day    Current packs/day: 0.50    Average packs/day: 0.7 packs/day for 38.7 years (28.6 ttl pk-yrs)    Types: Cigarettes    Start date: 03/2022   Smokeless tobacco: Never  Vaping Use   Vaping status: Never Used  Substance and Sexual Activity   Alcohol  use: Yes    Alcohol /week: 2.0 standard drinks of alcohol     Types: 2 Cans of beer per week    Comment: states she is cutting back on beer, reports last beer nearly two weeks ago but positive alcohol  level 5 days ago (09/29/23).   Drug use: Never   Sexual activity: Not Currently    Birth control/protection: Abstinence  Other Topics Concern   Not on file  Social History Narrative   Pt states she has had 4 children, however, one child died died of SIDS at age 68.5 months. Pt is divorced. Pt has a female roommate in  which she lives with. Pt states she is cutting down on drinking beer and now drinks 2-3 beers per week and had her last beer yesterday morning. Pt still smokes but is cutting down and only smokes 3 cigarettes per day now.   Social Drivers of Corporate Investment Banker Strain: Not on file  Food Insecurity: No  Food Insecurity (10/03/2023)   Hunger Vital Sign    Worried About Running Out of Food in the Last Year: Never true    Ran Out of Food in the Last Year: Never true  Transportation Needs: No Transportation Needs (10/03/2023)   PRAPARE - Administrator, Civil Service (Medical): No    Lack of Transportation (Non-Medical): No  Recent Concern: Transportation Needs - Unmet Transportation Needs (08/20/2023)   PRAPARE - Administrator, Civil Service (Medical): Yes    Lack of Transportation (Non-Medical): Yes  Physical Activity: Not on file  Stress: Not on file  Social Connections: Socially Isolated (10/03/2023)   Social Connection and Isolation Panel    Frequency of Communication with Friends and Family: Three times a week    Frequency of Social Gatherings with Friends and Family: Never    Attends Religious Services: Never    Database Administrator or Organizations: No    Attends Engineer, Structural: Never    Marital Status: Divorced   Family History  Problem Relation Age of Onset   Cancer Mother    Leukemia Mother    Multiple myeloma Mother    Cancer Father        unsure what kind   Cancer - Colon Neg Hx    Colon polyps Neg Hx    Scheduled Meds:  sodium chloride    Intravenous Once   calcium  carbonate  1 tablet Oral TID WC   feeding supplement  237 mL Oral TID BM   folic acid   1 mg Oral Daily   furosemide   40 mg Oral BID   gabapentin   600 mg Oral TID   lactulose   20 g Oral TID   lidocaine   2 patch Transdermal Q24H   lidocaine  (PF)  10 mL Other Once   midodrine   20 mg Oral TID WC   multivitamin with minerals  1 tablet Oral Daily   nutrition supplement (JUVEN)  1 packet Oral BID BM   pantoprazole   40 mg Oral BID   thiamine   100 mg Oral Daily   triamcinolone  0.1 % cream : eucerin   Topical Daily   Continuous Infusions: PRN Meds:.albuterol , alum & mag hydroxide-simeth, antiseptic oral rinse, artificial tears, hydrOXYzine , melatonin, Muscle Rub,  ondansetron , mouth rinse, oxyCODONE , phenol, prochlorperazine , simethicone , witch hazel-glycerin  Medications Prior to Admission:  Prior to Admission medications   Medication Sig Start Date End Date Taking? Authorizing Provider  alum & mag hydroxide-simeth (MAALOX/MYLANTA) 200-200-20 MG/5ML suspension Take 15 mLs by mouth every 6 (six) hours as needed for indigestion or heartburn.   Yes [provider]  ondansetron  (ZOFRAN ) 4 MG tablet Take 1 tablet (4 mg total) by mouth every 8 (eight) hours as needed for nausea or vomiting. 09/17/23  Yes Rogelia Jerilynn RAMAN, MD  oxyCODONE -acetaminophen  (PERCOCET/ROXICET) 5-325 MG tablet Take 1 tablet by mouth every 6 (six) hours as needed for severe pain (pain score 7-10). 09/29/23  Yes Upstill, Margit, PA-C  sertraline  (ZOLOFT ) 50 MG tablet Take 1 tablet (50 mg total) by mouth daily. 08/22/23  Yes Pearlean Manus, MD  calcium  carbonate (TUMS - DOSED IN MG ELEMENTAL CALCIUM ) 500 MG chewable tablet Chew 1 tablet (200 mg of elemental calcium  total) by mouth 3 (three) times daily with meals. 10/29/23   Akula, Vijaya, MD  Elastic Bandages & Supports (FITRITE BACK BRACE WITH PULLEY) MISC 1 Units by Does not apply route daily. 05/25/23   Bevely Doffing, FNP  feeding supplement (ENSURE PLUS HIGH PROTEIN) LIQD Take 237 mLs by mouth 3 (three) times daily between meals. 10/29/23   Akula, Vijaya, MD  folic acid  (FOLVITE ) 1 MG tablet Take 1 tablet (1 mg total) by mouth daily. 10/30/23   Akula, Vijaya, MD  gabapentin  (NEURONTIN ) 300 MG capsule Take 1 capsule (300 mg total) by mouth 3 (three) times daily. 10/29/23   Cherlyn Labella, MD  lactulose  (CHRONULAC ) 10 GM/15ML solution Take 45 mLs (30 g total) by mouth 3 (three) times daily. 10/29/23   Akula, Vijaya, MD  lidocaine  (LIDODERM ) 5 % Place 2 patches onto the skin daily. Remove & Discard patch within 12 hours  or as directed by MD 10/30/23   Cherlyn Labella, MD  midodrine  (PROAMATINE ) 5 MG tablet Take 3 tablets (15 mg total) by  mouth every 8 (eight) hours. 10/29/23   Akula, Vijaya, MD  Multiple Vitamin (MULTIVITAMIN WITH MINERALS) TABS tablet Take 1 tablet by mouth daily. 10/30/23   Akula, Vijaya, MD  nutrition supplement, JUVEN, (JUVEN) PACK Take 1 packet by mouth 2 (two) times daily between meals. 10/29/23   Akula, Vijaya, MD  ondansetron  (ZOFRAN -ODT) 4 MG disintegrating tablet Take 1 tablet (4 mg total) by mouth every 8 (eight) hours as needed for nausea or vomiting. Patient not taking: Reported on 10/03/2023 09/29/23   Odell Balls, PA-C  pantoprazole  (PROTONIX ) 40 MG tablet Take 1 tablet (40 mg total) by mouth 2 (two) times daily. 10/29/23   Akula, Vijaya, MD  potassium chloride  SA (KLOR-CON  M) 20 MEQ tablet Take 2 tablets (40 mEq total) by mouth daily for 2 doses. Patient not taking: Reported on 10/03/2023 09/17/23 09/19/23  Rogelia Jerilynn RAMAN, MD  rifaximin  (XIFAXAN ) 550 MG TABS tablet Take 1 tablet (550 mg total) by mouth 2 (two) times daily. 10/29/23   Akula, Vijaya, MD  sucralfate  (CARAFATE ) 1 GM/10ML suspension Take 10 mLs (1 g total) by mouth every 6 (six) hours. 10/29/23   Akula, Vijaya, MD  thiamine  (VITAMIN B-1) 100 MG tablet Take 1 tablet (100 mg total) by mouth daily. 10/30/23   Akula, Vijaya, MD   Allergies  Allergen Reactions   Bee Venom Anaphylaxis    Cannot take epi for this allergy d/t heart condition per pt   Fire Ant Hives and Rash   Latex Rash    Latex band aids, gloves don't bother her   Review of Systems  Cardiovascular:  Positive for leg swelling.  Gastrointestinal:  Positive for abdominal distention.    Physical Exam Cardiovascular:     Rate and Rhythm: Normal rate.  Pulmonary:     Effort: Pulmonary effort is normal.  Abdominal:     General: There is distension.  Skin:    General: Skin is warm and dry.  Neurological:     Mental Status: She is alert.  Psychiatric:        Thought Content: Thought content is delusional.        Cognition and Memory: Cognition is impaired.         Judgment: Judgment is inappropriate.     Vital Signs: BP (!) 97/55 (BP Location: Left Arm)   Pulse 96   Temp 98.2 F (36.8 C) (Oral)   Resp 18   Ht 5' 2 (1.575 m)   Wt 55.9 kg   SpO2 100%   BMI 22.55 kg/m  Pain Scale: 0-10 POSS *See Group Information*: S-Acceptable,Sleep, easy to arouse Pain Score: 10-Worst pain ever   SpO2: SpO2: 100 % O2 Device:SpO2: 100 % O2 Flow Rate: .   IO: Intake/output summary:  Intake/Output Summary (Last 24 hours) at 12/05/2023 1226 Last data filed at 12/05/2023 0600 Gross per 24 hour  Intake 720 ml  Output --  Net 720 ml    LBM: Last BM Date : 12/04/23 Baseline Weight: Weight: 56.7 kg Most recent weight: Weight: 55.9 kg     Palliative Assessment/Data:  60 %   PMT will follow intermittently,  this is a disposition issue at this time  Time:  75 minutes   Discussed in detail with Dr Joan Signed by: Joan Plants, NP   Please contact Palliative Medicine Team phone at [phone] for questions and concerns.  For individual provider: See Tracey

## 2023-12-05 NOTE — Consult Note (Signed)
 WOC Nurse Consult Note: Reason for Consult:Edema to lower legs and feet due to ongoing ascites and hypoalbuminemia.  Patient has been refusing SCDs due to safety and need to get up to toilet many times a shift.  She agrees to modified light compression to facilitate fluid return.   Wound type: Edema to legs and feet, intact skin with some skin changes in appearance to lower shin.  Left>Right Pressure Injury POA: NA Measurement: edema to feet and legs  Wound bed:NA Drainage (amount, consistency, odor)  NOne  no weeping  Periwound: skin changes including hyperkeratosis are present to lower legs.  Dressing procedure/placement/frequency: Bedside RN to perform:  Apply Triamcinolone  cream to lower legs.  Wrap bilateral legs from below toes to below knee with kerlix and secure with ace wrap.  Change daily.  Discussed with patient who states she can continue this at home, along with elevating legs when she can.  Will not follow at this time.  Please re-consult if needed.  Darice Cooley MSN, RN, FNP-BC CWON Wound, Ostomy, Continence Nurse Outpatient [facility] [phone] Work cell phone:  [phone]

## 2023-12-06 DIAGNOSIS — K922 Gastrointestinal hemorrhage, unspecified: Secondary | ICD-10-CM | POA: Diagnosis not present

## 2023-12-06 DIAGNOSIS — K7460 Unspecified cirrhosis of liver: Secondary | ICD-10-CM | POA: Diagnosis not present

## 2023-12-06 DIAGNOSIS — R197 Diarrhea, unspecified: Secondary | ICD-10-CM | POA: Diagnosis not present

## 2023-12-06 DIAGNOSIS — D62 Acute posthemorrhagic anemia: Secondary | ICD-10-CM | POA: Diagnosis not present

## 2023-12-06 DIAGNOSIS — R112 Nausea with vomiting, unspecified: Secondary | ICD-10-CM | POA: Diagnosis not present

## 2023-12-06 DIAGNOSIS — K7290 Hepatic failure, unspecified without coma: Secondary | ICD-10-CM | POA: Diagnosis not present

## 2023-12-06 LAB — CBC
HCT: 24.8 % — ABNORMAL LOW (ref 36.0–46.0)
Hemoglobin: 8.6 g/dL — ABNORMAL LOW (ref 12.0–15.0)
MCH: 33 pg (ref 26.0–34.0)
MCHC: 34.7 g/dL (ref 30.0–36.0)
MCV: 95 fL (ref 80.0–100.0)
Platelets: 143 K/uL — ABNORMAL LOW (ref 150–400)
RBC: 2.61 MIL/uL — ABNORMAL LOW (ref 3.87–5.11)
RDW: 16.2 % — ABNORMAL HIGH (ref 11.5–15.5)
WBC: 4.6 K/uL (ref 4.0–10.5)
nRBC: 0 % (ref 0.0–0.2)

## 2023-12-06 LAB — COMPREHENSIVE METABOLIC PANEL WITH GFR
ALT: 30 U/L (ref 0–44)
AST: 75 U/L — ABNORMAL HIGH (ref 15–41)
Albumin: 1.9 g/dL — ABNORMAL LOW (ref 3.5–5.0)
Alkaline Phosphatase: 70 U/L (ref 38–126)
Anion gap: 11 (ref 5–15)
BUN: 18 mg/dL (ref 6–20)
CO2: 22 mmol/L (ref 22–32)
Calcium: 7.8 mg/dL — ABNORMAL LOW (ref 8.9–10.3)
Chloride: 98 mmol/L (ref 98–111)
Creatinine, Ser: 0.47 mg/dL (ref 0.44–1.00)
GFR, Estimated: >60 mL/min (ref >60)
Glucose, Bld: 105 mg/dL — ABNORMAL HIGH (ref 70–99)
Potassium: 3 mmol/L — ABNORMAL LOW (ref 3.5–5.1)
Sodium: 131 mmol/L — ABNORMAL LOW (ref 135–145)
Total Bilirubin: 1.6 mg/dL — ABNORMAL HIGH (ref 0.0–1.2)
Total Protein: 6.5 g/dL (ref 6.5–8.1)

## 2023-12-06 LAB — MAGNESIUM: Magnesium: 1.4 mg/dL — ABNORMAL LOW (ref 1.7–2.4)

## 2023-12-06 MED ORDER — POTASSIUM CHLORIDE CRYS ER 20 MEQ PO TBCR
60.0000 meq | EXTENDED_RELEASE_TABLET | ORAL | Status: AC
  Administered 2023-12-06 (×2): 60 meq via ORAL
  Filled 2023-12-06 (×2): qty 3

## 2023-12-06 MED ORDER — MAGNESIUM SULFATE 4 GM/100ML IV SOLN
4.0000 g | Freq: Once | INTRAVENOUS | Status: AC
  Administered 2023-12-06: 4 g via INTRAVENOUS
  Filled 2023-12-06: qty 100

## 2023-12-06 MED ORDER — ALBUMIN HUMAN 25 % IV SOLN
25.0000 g | Freq: Once | INTRAVENOUS | Status: AC
  Administered 2023-12-06: 25 g via INTRAVENOUS
  Filled 2023-12-06: qty 100

## 2023-12-06 MED ORDER — FLUCONAZOLE 150 MG PO TABS
150.0000 mg | ORAL_TABLET | Freq: Once | ORAL | Status: AC
  Administered 2023-12-06: 150 mg via ORAL
  Filled 2023-12-06: qty 1

## 2023-12-06 NOTE — Progress Notes (Signed)
 Progress Note   Patient: Joan Wood FMW:[id number] DOB: 1970/09/06 DOA: 10/03/2023     64 DOS: the patient was seen and examined on 12/06/2023   Brief hospital course: 53 y.o. female with a history of decompensated liver cirrhosis, PUD, gastric ulcer perforation status post ex lap, GERD, pancreatitis, obstructive sleep apnea. Patient presented secondary to nausea, coffee-ground emesis, bloody diarrhea with concern for upper GI bleed. 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 Hospitalization complicated by hepatic encephalopathy treated with lactulose  and rifaximin . Patient also requiring recurrent paracenteses for her recurrent ascites.   Assessment and Plan: Decompensated alcoholic cirrhosis with recurrent ascites Underwent therapeutic paracentesis on 11/12/2023 with removal of 750 cc of ascitic fluid.  Ascitic fluid was not consistent with SBP.  Received IV Rocephin  for UTI.  Patient was initially treated with rifaximin  and lactulose  for encephalopathy.  At this time, only on lactulose  has been continued since rifaximin  will not be covered at the outpatient facility.  Patient underwent  ultrasound-guided paracentesis on 11/24/2023 again with removal of 3.6 L of fluid and will be considered for repeat paracentesis today.  Dr. Sonjia spoke with interventional radiology on 11/28/23 about potential peritoneal drain placement since she had it in the past but given her current circumstances and need for follow-up and management of drains, it is unlikely that patient will be able to do unless she has assistance at home, or goes to skilled nursing facility and somebody is there to manage her peritoneal drain.  Patient does have impaired memory and understanding of the situation. Patient need someone to take care of PleurX catheter before inserting it per  IR -Recent MELD of 35 with correlates to roughly 50% 3 month mortality. -Abd distended. Pt complaining of some sob and abd pain. Will order repeat therapeutic paracentesis with IV albumin     Hepatic encephalopathy Improved.  Continue lactulose  to keep bowel movements 3-4 times a day.  States that she has been having good bowel movements.   Enterobacter UTI. Treated with Bactrim  DS twice daily and has completed the course   Hypokalemia Cont to monitor   Hypomagnesemia.   Cont to replace as needed   Hypoalbuminemia Secondary to underlying liver disease.  Causing peripheral edema and recurrent ascites.   Thrombocytopenia Secondary to liver disease.  Latest CBC showed hemoglobin of 9.0 and stable and Platelet of 134 and improving.   Acute kidney injury Presumed secondary to ATN from contrast. Nephrology was consulted and renal function has improved also.    Acute appendicitis General surgery was consulted and patient was thought to be high surgical risk so was treated conservatively with antibiotics.  No further abdominal pain.   Chronic hypotension Has received albumin  intermittently.  Currently on midodrine  20 milligram 3 times daily.  Latest blood pressure of 111/64 and has remained stable.   Chronic hyponatremia Patient's baseline appears to be around 125 -128.  Latest sodium of 126.  Nephrology also saw the patient during hospitalization.   Acute blood loss anemia secondary to acute upper GI bleed from esophagitis and esophageal necrosis Patient was seen by GI during hospitalization and underwent upper GI endoscopy on 10/04/2023 with findings of grade D esophagitis in addition to concern for acute esophageal necrosis. Non-bleeding gastric ulcer noted GI recommended Carafate  for 2 weeks and Protonix  twice daily with outpatient GI follow-up.  Patient received 4 units of packed RBC during this hospitalization.  Latest hemoglobin of 8.9 and has remained stable.  Will transfuse if  necessary.    Lower extremity edema, right ankle tenderness. Peripheral neuropathy Secondary to hypoalbuminemia from liver disease.  Duplex ultrasound of the lower extremity negative for DVT.   x-ray of the right ankle without any bony issues.  Gabapentin  dose has been increased from 300 milligram to 600 mg 3 times daily for the last few days and has been tolerating.  Will need to closely monitor for sedation..  Continue Lasix  40 mg twice daily.  Slightly elevated uric acid level.  Received colchicine  0.6 mg daily for few days.  Currently off colchicine .   Tobacco abuse Counseling done   History of alcohol  abuse No active issues.  Counseling done.  Continue multivitamin and thiamine .  No signs of withdrawal.   Severe protein malnutrition Nutrition Status: Body mass index is 22.62 kg/m.  Nutrition Problem: Severe Malnutrition Etiology: chronic illness (cirrhosis) Signs/Symptoms: severe fat depletion, severe muscle depletion Interventions: Ensure Enlive (each supplement provides 350kcal and 20 grams of protein), MVI, Liberalize Diet, Magic cup Continue nutritional supplement.  Continue multivitamin thiamine  and folic acid .   Pressure injury sacrum stage II pressure Wound 10/15/23 0900 Pressure Injury Sacrum Mid Stage 2 -  Partial thickness loss of dermis presenting as a shallow open injury with a red, pink wound bed without slough. (Active)  Unclear if present on admission but continue wound care.    Goals of care Plan for skilled nursing facility for rehabitation.  Difficult to place -Palliative Care re-consulted to assist with continued goals of care      Subjective: Complaining of more distended abd with subjective sob and discomfort  Physical Exam: Vitals:   12/05/23 2329 12/06/23 0420 12/06/23 0500 12/06/23 0818  BP: 115/75 118/77  (!) 133/90  Pulse: (!) 104 99  100  Resp: 18 18  18   Temp: 98.7 F (37.1 C)   97.6 F (36.4 C)  TempSrc: Oral   Oral  SpO2: 100% 100%  100%   Weight:   58.6 kg   Height:       General exam: Conversant, in no acute distress Respiratory system: normal chest rise, clear, no audible wheezing Cardiovascular system: regular rhythm, s1-s2 Gastrointestinal system: Nondistended, nontender, pos BS Central nervous system: No seizures, no tremors Extremities: No cyanosis, no joint deformities Skin: No rashes, no pallor Psychiatry: Affect normal // no auditory hallucinations   Data Reviewed:  Labs reviewed: Na 131, K 3.0, Cr 0.47, Mg 1.9, WBC 4.6, Hgb 8.6, Plts 143  Family Communication: Pt in room, family not at bedside  Disposition: Status is: Inpatient Remains inpatient appropriate because: severity of illness  Planned Discharge Destination: Skilled nursing facility    Author: Garnette Pelt, MD 12/06/2023 3:56 PM  For on call review [email or web address].

## 2023-12-06 NOTE — Plan of Care (Signed)
  Problem: Health Behavior/Discharge Planning: Goal: Ability to manage health-related needs will improve Outcome: Progressing   Problem: Clinical Measurements: Goal: Will remain free from infection Outcome: Progressing   Problem: Clinical Measurements: Goal: Diagnostic test results will improve Outcome: Progressing   Problem: Activity: Goal: Risk for activity intolerance will decrease Outcome: Progressing   Problem: Nutrition: Goal: Adequate nutrition will be maintained Outcome: Progressing   Problem: Coping: Goal: Level of anxiety will decrease Outcome: Progressing   Problem: Pain Managment: Goal: General experience of comfort will improve and/or be controlled Outcome: Progressing   Problem: Safety: Goal: Ability to remain free from injury will improve Outcome: Progressing   Problem: Skin Integrity: Goal: Risk for impaired skin integrity will decrease Outcome: Progressing

## 2023-12-07 ENCOUNTER — Inpatient Hospital Stay (HOSPITAL_COMMUNITY)

## 2023-12-07 DIAGNOSIS — R112 Nausea with vomiting, unspecified: Secondary | ICD-10-CM | POA: Diagnosis not present

## 2023-12-07 DIAGNOSIS — K7290 Hepatic failure, unspecified without coma: Secondary | ICD-10-CM | POA: Diagnosis not present

## 2023-12-07 DIAGNOSIS — K922 Gastrointestinal hemorrhage, unspecified: Secondary | ICD-10-CM | POA: Diagnosis not present

## 2023-12-07 DIAGNOSIS — R197 Diarrhea, unspecified: Secondary | ICD-10-CM | POA: Diagnosis not present

## 2023-12-07 DIAGNOSIS — D62 Acute posthemorrhagic anemia: Secondary | ICD-10-CM | POA: Diagnosis not present

## 2023-12-07 DIAGNOSIS — K7460 Unspecified cirrhosis of liver: Secondary | ICD-10-CM | POA: Diagnosis not present

## 2023-12-07 HISTORY — PX: IR PARACENTESIS: IMG2679

## 2023-12-07 LAB — CBC
HCT: 25.3 % — ABNORMAL LOW (ref 36.0–46.0)
Hemoglobin: 8.7 g/dL — ABNORMAL LOW (ref 12.0–15.0)
MCH: 33.2 pg (ref 26.0–34.0)
MCHC: 34.4 g/dL (ref 30.0–36.0)
MCV: 96.6 fL (ref 80.0–100.0)
Platelets: 148 K/uL — ABNORMAL LOW (ref 150–400)
RBC: 2.62 MIL/uL — ABNORMAL LOW (ref 3.87–5.11)
RDW: 16 % — ABNORMAL HIGH (ref 11.5–15.5)
WBC: 6 K/uL (ref 4.0–10.5)
nRBC: 0 % (ref 0.0–0.2)

## 2023-12-07 LAB — COMPREHENSIVE METABOLIC PANEL WITH GFR
ALT: 29 U/L (ref 0–44)
AST: 74 U/L — ABNORMAL HIGH (ref 15–41)
Albumin: 2.5 g/dL — ABNORMAL LOW (ref 3.5–5.0)
Alkaline Phosphatase: 70 U/L (ref 38–126)
Anion gap: 9 (ref 5–15)
BUN: 20 mg/dL (ref 6–20)
CO2: 24 mmol/L (ref 22–32)
Calcium: 8.2 mg/dL — ABNORMAL LOW (ref 8.9–10.3)
Chloride: 96 mmol/L — ABNORMAL LOW (ref 98–111)
Creatinine, Ser: 0.46 mg/dL (ref 0.44–1.00)
GFR, Estimated: >60 mL/min (ref >60)
Glucose, Bld: 84 mg/dL (ref 70–99)
Potassium: 4.1 mmol/L (ref 3.5–5.1)
Sodium: 129 mmol/L — ABNORMAL LOW (ref 135–145)
Total Bilirubin: 1.6 mg/dL — ABNORMAL HIGH (ref 0.0–1.2)
Total Protein: 6.9 g/dL (ref 6.5–8.1)

## 2023-12-07 LAB — MAGNESIUM: Magnesium: 1.9 mg/dL (ref 1.7–2.4)

## 2023-12-07 MED ORDER — LIDOCAINE-EPINEPHRINE 1 %-1:100000 IJ SOLN
INTRAMUSCULAR | Status: AC
Start: 2023-12-07 — End: 2023-12-07
  Filled 2023-12-07: qty 1

## 2023-12-07 MED ORDER — LIDOCAINE-EPINEPHRINE 1 %-1:100000 IJ SOLN
20.0000 mL | Freq: Once | INTRAMUSCULAR | Status: AC
  Administered 2023-12-07: 10 mL via INTRADERMAL

## 2023-12-07 NOTE — Progress Notes (Signed)
 Progress Note   Patient: Joan Wood FMW:[id number] DOB: 08-30-1970 DOA: 10/03/2023     53 DOS: the patient was seen and examined on 12/07/2023   Brief hospital course: 53 y.o. female with a history of decompensated liver cirrhosis, PUD, gastric ulcer perforation status post ex lap, GERD, pancreatitis, obstructive sleep apnea. Patient presented secondary to nausea, coffee-ground emesis, bloody diarrhea with concern for upper GI bleed. 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 Hospitalization complicated by hepatic encephalopathy treated with lactulose  and rifaximin . Patient also requiring recurrent paracenteses for her recurrent ascites.   Assessment and Plan: Decompensated alcoholic cirrhosis with recurrent ascites Underwent therapeutic paracentesis on 11/12/2023 with removal of 750 cc of ascitic fluid.  Ascitic fluid was not consistent with SBP.  Received IV Rocephin  for UTI.  Patient was initially treated with rifaximin  and lactulose  for encephalopathy.  At this time, only on lactulose  has been continued since rifaximin  will not be covered at the outpatient facility.  Patient underwent  ultrasound-guided paracentesis on 11/24/2023 again with removal of 3.6 L of fluid and will be considered for repeat paracentesis today.  Dr. Sonjia spoke with interventional radiology on 11/28/23 about potential peritoneal drain placement since she had it in the past but given her current circumstances and need for follow-up and management of drains, it is unlikely that patient will be able to do unless she has assistance at home, or goes to skilled nursing facility and somebody is there to manage her peritoneal drain.  Patient does have impaired memory and understanding of the situation. Patient need someone to take care of PleurX catheter before inserting it per  IR -Recent MELD of 35 with correlates to roughly 50% 3 month mortality. -Abd had been becoming more distended. Pt is s/p paracentesis yielding 4.3L   Hepatic encephalopathy Improved.  Continue lactulose  to keep bowel movements 3-4 times a day.  States that she has been having good bowel movements.   Enterobacter UTI. Treated with Bactrim  DS twice daily and has completed the course   Hypokalemia Cont to monitor   Hypomagnesemia.   Cont to replace as needed   Hypoalbuminemia Secondary to underlying liver disease.  Causing peripheral edema and recurrent ascites.   Thrombocytopenia Secondary to liver disease.  Latest CBC showed hemoglobin of 9.0 and stable and Platelet of 134 and improving.   Acute kidney injury Presumed secondary to ATN from contrast. Nephrology was consulted and renal function has improved also.    Acute appendicitis General surgery was consulted and patient was thought to be high surgical risk so was treated conservatively with antibiotics.  No further abdominal pain.   Chronic hypotension Has received albumin  intermittently.  Currently on midodrine  20 milligram 3 times daily.  Latest blood pressure of 111/64 and has remained stable.   Chronic hyponatremia Patient's baseline appears to be around 125 -128.  Latest sodium of 126.  Nephrology also saw the patient during hospitalization.   Acute blood loss anemia secondary to acute upper GI bleed from esophagitis and esophageal necrosis Patient was seen by GI during hospitalization and underwent upper GI endoscopy on 10/04/2023 with findings of grade D esophagitis in addition to concern for acute esophageal necrosis. Non-bleeding gastric ulcer noted GI recommended Carafate  for 2 weeks and Protonix  twice daily with outpatient GI follow-up.  Patient received 4 units of packed RBC during this hospitalization.   Latest hemoglobin of 8.9 and has remained  stable.  Will transfuse if necessary.    Lower extremity edema, right  ankle tenderness. Peripheral neuropathy Secondary to hypoalbuminemia from liver disease.  Duplex ultrasound of the lower extremity negative for DVT.   x-ray of the right ankle without any bony issues.  Gabapentin  dose has been increased from 300 milligram to 600 mg 3 times daily for the last few days and has been tolerating.  Will need to closely monitor for sedation..  Continue Lasix  40 mg twice daily.  Slightly elevated uric acid level.  Received colchicine  0.6 mg daily for few days.  Currently off colchicine .   Tobacco abuse Counseling done   History of alcohol  abuse No active issues.  Counseling done.  Continue multivitamin and thiamine .  No signs of withdrawal.   Severe protein malnutrition Nutrition Status: Body mass index is 22.62 kg/m.  Nutrition Problem: Severe Malnutrition Etiology: chronic illness (cirrhosis) Signs/Symptoms: severe fat depletion, severe muscle depletion Interventions: Ensure Enlive (each supplement provides 350kcal and 20 grams of protein), MVI, Liberalize Diet, Magic cup Continue nutritional supplement.  Continue multivitamin thiamine  and folic acid .   Pressure injury sacrum stage II pressure Wound 10/15/23 0900 Pressure Injury Sacrum Mid Stage 2 -  Partial thickness loss of dermis presenting as a shallow open injury with a red, pink wound bed without slough. (Active)  Unclear if present on admission but continue wound care.    Goals of care Plan for skilled nursing facility for rehabitation.  Difficult to place -Palliative Care re-consulted to assist with continued goals of care      Subjective: Feels much better after paracentesis this AM. Abd discomfort resolved and pt breathing better  Physical Exam: Vitals:   12/07/23 0521 12/07/23 0811 12/07/23 0841 12/07/23 1008  BP: 105/68 101/70 100/62 102/74  Pulse: (!) 101 (!) 105  100  Resp: 18 18 18 18   Temp: 98.2 F (36.8 C) 99 F (37.2 C)  98.7 F (37.1 C)  TempSrc:  Oral  Oral  SpO2: 100% 99%   92%  Weight:      Height:       General exam: Awake, laying in bed, in nad Respiratory system: Normal respiratory effort, no wheezing Cardiovascular system: regular rate, s1, s2 Gastrointestinal system: Soft, less distended Central nervous system: CN2-12 grossly intact, strength intact Extremities: Perfused, no clubbing Skin: Normal skin turgor, no notable skin lesions seen Psychiatry: Mood normal // no visual hallucinations   Data Reviewed:  Labs reviewed: Na 129, K 4.1, Cr 0.46, Mg 1.9, WBC 6.0, Hgb 8.7, Plts 148  Family Communication: Pt in room, family not at bedside  Disposition: Status is: Inpatient Remains inpatient appropriate because: severity of illness  Planned Discharge Destination: Skilled nursing facility    Author: Garnette Pelt, MD 12/07/2023 1:58 PM  For on call review [email or web address].

## 2023-12-07 NOTE — Progress Notes (Signed)
 Occupational Therapy Treatment Patient Details Name: Joan Wood MRN: [id number] DOB: 1970/03/07 Today's Date: 12/07/2023   History of present illness 53 y.o. female admitted 10/03/2023 with nausea/vomiting and bloody diarrhea. Workup for decompensated alcoholic cirrhosis, ascites, hepatic encephalopathy. S/p upper EGD 9/25 concerning for possible acute esophageal necrosis, portal hypertension, non-bleeding gastric and duodenal ulcers. S/p recurrent paracentesis for recurrent ascites; IR deferred PleurX catheter placement due to social situation. PMH includes decompensated cirrhosis, PUD, GERD, pancreatitis, OSA, peripheral neuropathy, tobacco abuse, alcohol  abuse, severe protein malnutrition.   OT comments  Pt greeted ambulating in room, agreeable for OT session. She is demonstrating good progress towards goals. Continues to be limited by cognitive deficits, primarily related to attention, awareness, reasoning, and problem solving. Functionally, she was no more than supervision for mobility in room without AD. Requiring up to max A for LB dressing given ascites and abdominal distention/LE edema. Performed scavenger hunt around floor unit, pt able to locate 4/4 items with min VC for problem solving, needs max cues for recall of 4/4 items (able to generally recall 3/4).   Given disposition challenges, pt is functioning at a level that she could return home if she has strict 24/7 supervision/assist due to cog deficits. Will continue to follow.      If plan is discharge home, recommend the following:  A little help with bathing/dressing/bathroom;Direct supervision/assist for medications management;Direct supervision/assist for financial management;Assist for transportation;Supervision due to cognitive status   Equipment Recommendations  BSC/3in1    Recommendations for Other Services      Precautions / Restrictions Precautions Precautions: Fall Recall of Precautions/Restrictions:  Impaired Precaution/Restrictions Comments: hx of orthostasis Restrictions Weight Bearing Restrictions Per Provider Order: No       Mobility Bed Mobility               General bed mobility comments: not assessed - pt received and left OOB    Transfers Overall transfer level: Needs assistance Equipment used: None Transfers: Sit to/from Stand Sit to Stand: Supervision           General transfer comment: Ambulatory in room and hallway without AD, slow pace observed.     Balance Overall balance assessment: Needs assistance Sitting-balance support: No upper extremity supported, Feet supported Sitting balance-Leahy Scale: Good Sitting balance - Comments: no LOB seated EOB   Standing balance support: During functional activity, No upper extremity supported Standing balance-Leahy Scale: Fair Standing balance comment: no overt LOB, slow and cautious gait noted                           ADL either performed or assessed with clinical judgement   ADL Overall ADL's : Needs assistance/impaired     Grooming: Supervision/safety;Wash/dry hands;Standing Grooming Details (indicate cue type and reason): sinkside             Lower Body Dressing: Maximal assistance;Sitting/lateral leans;Sit to/from stand Lower Body Dressing Details (indicate cue type and reason): adjusting bottom cuffs of paper scrub pants for better fit, limited by abdominal distention and LE edema Toilet Transfer: Supervision/safety;Ambulation;Regular Toilet;Grab bars Toilet Transfer Details (indicate cue type and reason): GBs to assist with controlled descent Toileting- Clothing Manipulation and Hygiene: Supervision/safety;Sitting/lateral lean;Sit to/from stand Toileting - Clothing Manipulation Details (indicate cue type and reason): manages clothes without much difficulty, incr time for anterior peri care (suspect 2/2 large abdominal body habitus from ascites)     Functional mobility during  ADLs: Supervision/safety      Extremity/Trunk  Assessment Upper Extremity Assessment Upper Extremity Assessment: Generalized weakness (R shoulder soreness, reports it feels like she over did it, although ROM intact)            Vision       Perception     Praxis     Communication Communication Communication: No apparent difficulties   Cognition Arousal: Alert Behavior During Therapy: WFL for tasks assessed/performed Cognition: Cognition impaired     Awareness: Intellectual awareness impaired Memory impairment (select all impairments): Short-term memory, Working memory Attention impairment (select first level of impairment): Sustained attention Executive functioning impairment (select all impairments): Organization, Reasoning, Problem solving, Initiation OT - Cognition Comments: remains internally and externally easily distracted, redirected easily; remains with poor working memory and rather tangential                 Following commands: Impaired Following commands impaired: Follows multi-step commands inconsistently      Cueing   Cueing Techniques: Verbal cues, Visual cues  Exercises Exercises: Other exercises Other Exercises Other Exercises: alphabet writing using RUE on wall - focus on improving R shoulder ROM    Shoulder Instructions       General Comments pt with rubber bands around distal LLE and on top of L foot (pt reports she had placed rubber band on pants to keep from falling down). OT removed rubber band d/t LE edema.    Pertinent Vitals/ Pain       Pain Assessment Pain Assessment: Faces Faces Pain Scale: Hurts a little bit Pain Location: R shoulder Pain Descriptors / Indicators: Aching, Dull Pain Intervention(s): Repositioned, Monitored during session, Limited activity within patient's tolerance  Home Living                                          Prior Functioning/Environment              Frequency  Min  1X/week        Progress Toward Goals  OT Goals(current goals can now be found in the care plan section)  Progress towards OT goals: Progressing toward goals  Acute Rehab OT Goals Time For Goal Achievement: 12/21/23  Plan      Co-evaluation                 AM-PAC OT 6 Clicks Daily Activity     Outcome Measure   Help from another person eating meals?: None Help from another person taking care of personal grooming?: A Little Help from another person toileting, which includes using toliet, bedpan, or urinal?: A Little Help from another person bathing (including washing, rinsing, drying)?: A Little Help from another person to put on and taking off regular upper body clothing?: A Little Help from another person to put on and taking off regular lower body clothing?: A Little 6 Click Score: 19    End of Session    OT Visit Diagnosis: Other abnormalities of gait and mobility (R26.89);Muscle weakness (generalized) (M62.81);Pain;Other symptoms and signs involving cognitive function   Activity Tolerance Patient tolerated treatment well   Patient Left in bed;with call bell/phone within reach   Nurse Communication          Time: 8849-8786 OT Time Calculation (min): 23 min  Charges: OT General Charges $OT Visit: 1 Visit OT Treatments $Self Care/Home Management : 8-22 mins $Therapeutic Activity: 8-22 mins  Shakur Lembo M. Yaslyn Cumby, OTR/L [facility] Acute  Rehabilitation Services [phone] Secure Chat Preferred  Zenobia Kuennen 12/07/2023, 2:33 PM

## 2023-12-07 NOTE — Plan of Care (Signed)
  Problem: Health Behavior/Discharge Planning: Goal: Ability to manage health-related needs will improve Outcome: Progressing   Problem: Clinical Measurements: Goal: Will remain free from infection Outcome: Progressing   Problem: Activity: Goal: Risk for activity intolerance will decrease Outcome: Progressing   Problem: Coping: Goal: Level of anxiety will decrease Outcome: Progressing   Problem: Elimination: Goal: Will not experience complications related to bowel motility Outcome: Progressing   Problem: Safety: Goal: Ability to remain free from injury will improve Outcome: Progressing

## 2023-12-07 NOTE — Progress Notes (Signed)
 Physical Therapy Treatment Patient Details Name: Joan Wood MRN: [id number] DOB: [birth year] Today's Date: 12/07/2023   History of Present Illness 53 y.o. female admitted 10/03/2023 with nausea/vomiting and bloody diarrhea. Workup for decompensated alcoholic cirrhosis, ascites, hepatic encephalopathy. S/p upper EGD 9/25 concerning for possible acute esophageal necrosis, portal hypertension, non-bleeding gastric and duodenal ulcers. S/p recurrent paracentesis for recurrent ascites; IR deferred PleurX catheter placement due to social situation. PMH includes decompensated cirrhosis, PUD, GERD, pancreatitis, OSA, peripheral neuropathy, tobacco abuse, alcohol  abuse, severe protein malnutrition.    PT Comments  Pt ambulating independently with no AD in the hallway and agreeable to PT session. As pt has been making progress with mobility, re-evaluated balance with DGI assessment. Pt scored a 20/24 indicating that pt is at a lower risk of falling. Pt scored lower on stair negotiation due to strong preference to use bilateral handrails for comfort. Pt also has a slightly slower gait speed with increased medial/lateral sway that pt reports is not new. No overt LOB or instability noted when challenged cognitively. Pt's main limitations are related to cognition with decreased problem solving, attention, and awareness. Pt met all PT goals that were updated last session with no further acute PT needs. As pt does not have 24/7 assist at home, continue to recommend <3hrs post acute rehab. If pt has 24/7 supervision/assist at home, pt would be able to return home. Acute PT signing off. Please re-consult if there is a change in status.    If plan is discharge home, recommend the following: A little help with bathing/dressing/bathroom;Assistance with cooking/housework;Assist for transportation;Help with stairs or ramp for entrance;Direct supervision/assist for medications management;Direct supervision/assist for financial  management;Supervision due to cognitive status   Can travel by private vehicle     Yes  Equipment Recommendations  BSC/3in1       Precautions / Restrictions Precautions Precautions: Fall Recall of Precautions/Restrictions: Impaired Precaution/Restrictions Comments: hx of orthostasis Restrictions Weight Bearing Restrictions Per Provider Order: No     Mobility  Bed Mobility  General bed mobility comments: not assessed - pt received in hallway    Transfers Overall transfer level: Needs assistance Equipment used: None Transfers: Sit to/from Stand Sit to Stand: Modified independent (Device/Increase time)   Ambulation/Gait Ambulation/Gait assistance: Independent Gait Distance (Feet): 800 Feet (x800, x1000) Assistive device: None, Rollator (4 wheels) Gait Pattern/deviations: Step-through pattern, Decreased stride length, Drifts right/left Gait velocity: slightly decreased     General Gait Details: Intermittently drifts right/left, however, no instability or losses of balance. Trialed rollator for improved activity tolerance, however, pt able to tolerate long gait distance without seated rest break   Stairs Stairs: Yes Stairs assistance: Modified independent (Device/Increase time) Stair Management: Step to pattern, Two rails Number of Stairs: 5       Balance Overall balance assessment: Needs assistance Sitting-balance support: No upper extremity supported, Feet supported Sitting balance-Leahy Scale: Good     Standing balance support: During functional activity, No upper extremity supported Standing balance-Leahy Scale: Good    Standardized Balance Assessment Standardized Balance Assessment : Dynamic Gait Index   Dynamic Gait Index Level Surface: Mild Impairment Change in Gait Speed: Normal Gait with Horizontal Head Turns: Normal Gait with Vertical Head Turns: Normal Gait and Pivot Turn: Normal Step Over Obstacle: Mild Impairment Step Around Obstacles:  Normal Steps: Moderate Impairment Total Score: 20      Communication Communication Communication: No apparent difficulties  Cognition Arousal: Alert Behavior During Therapy: WFL for tasks assessed/performed   PT - Cognitive impairments: Awareness,  Problem solving, Safety/Judgement, Attention    PT - Cognition Comments: Frequent re-direction due to decreased attention Following commands: Impaired Following commands impaired: Follows multi-step commands inconsistently    Cueing Cueing Techniques: Verbal cues, Visual cues     General Comments General comments (skin integrity, edema, etc.): Pt with rubber band placed on R foot. Education provided on need to remove band due to edema      Pertinent Vitals/Pain Pain Assessment Pain Assessment: No/denies pain     PT Goals (current goals can now be found in the care plan section) Acute Rehab PT Goals PT Goal Formulation: With patient Time For Goal Achievement: 01/01/24 Potential to Achieve Goals: Good Progress towards PT goals: Goals met/education completed, patient discharged from PT    Frequency    Min 1X/week       AM-PAC PT 6 Clicks Mobility   Outcome Measure  Help needed turning from your back to your side while in a flat bed without using bedrails?: None Help needed moving from lying on your back to sitting on the side of a flat bed without using bedrails?: None Help needed moving to and from a bed to a chair (including a wheelchair)?: None Help needed standing up from a chair using your arms (e.g., wheelchair or bedside chair)?: None Help needed to walk in hospital room?: None Help needed climbing 3-5 steps with a railing? : None 6 Click Score: 24    End of Session   Activity Tolerance: Patient tolerated treatment well Patient left: in chair;with call bell/phone within reach Nurse Communication: Mobility status;Other (comment) (pt reported leaking from abdomen site) PT Visit Diagnosis: Other abnormalities  of gait and mobility (R26.89);Muscle weakness (generalized) (M62.81);Unsteadiness on feet (R26.81);Difficulty in walking, not elsewhere classified (R26.2)     Time: 1600-1630 PT Time Calculation (min) (ACUTE ONLY): 30 min  Charges:    $Gait Training: 8-22 mins $Neuromuscular Re-education: 8-22 mins PT General Charges $$ ACUTE PT VISIT: 1 Visit                    Kate ORN, PT, DPT Secure Chat Preferred  Rehab Office [phone]   Racquelle Hyser Wendolyn 12/07/2023, 5:10 PM

## 2023-12-07 NOTE — Procedures (Signed)
 PROCEDURE SUMMARY:  Successful image-guided paracentesis from the right lower abdomen.  Yielded 4.3 liters of slightly hazy yellow fluid.  No immediate complications.  EBL: trace Patient tolerated well.   Specimen not sent for labs.  Please see imaging section of Epic for full dictation.  Kimble DEL Deairra Halleck PA-C 12/07/2023 10:14 AM

## 2023-12-08 DIAGNOSIS — K7290 Hepatic failure, unspecified without coma: Secondary | ICD-10-CM | POA: Diagnosis not present

## 2023-12-08 DIAGNOSIS — R197 Diarrhea, unspecified: Secondary | ICD-10-CM | POA: Diagnosis not present

## 2023-12-08 DIAGNOSIS — K922 Gastrointestinal hemorrhage, unspecified: Secondary | ICD-10-CM | POA: Diagnosis not present

## 2023-12-08 DIAGNOSIS — D62 Acute posthemorrhagic anemia: Secondary | ICD-10-CM | POA: Diagnosis not present

## 2023-12-08 DIAGNOSIS — R112 Nausea with vomiting, unspecified: Secondary | ICD-10-CM | POA: Diagnosis not present

## 2023-12-08 DIAGNOSIS — K7460 Unspecified cirrhosis of liver: Secondary | ICD-10-CM | POA: Diagnosis not present

## 2023-12-08 LAB — COMPREHENSIVE METABOLIC PANEL WITH GFR
ALT: 26 U/L (ref 0–44)
AST: 73 U/L — ABNORMAL HIGH (ref 15–41)
Albumin: 2.3 g/dL — ABNORMAL LOW (ref 3.5–5.0)
Alkaline Phosphatase: 70 U/L (ref 38–126)
Anion gap: 9 (ref 5–15)
BUN: 19 mg/dL (ref 6–20)
CO2: 23 mmol/L (ref 22–32)
Calcium: 8.4 mg/dL — ABNORMAL LOW (ref 8.9–10.3)
Chloride: 95 mmol/L — ABNORMAL LOW (ref 98–111)
Creatinine, Ser: 0.57 mg/dL (ref 0.44–1.00)
GFR, Estimated: >60 mL/min (ref >60)
Glucose, Bld: 93 mg/dL (ref 70–99)
Potassium: 4.1 mmol/L (ref 3.5–5.1)
Sodium: 127 mmol/L — ABNORMAL LOW (ref 135–145)
Total Bilirubin: 2 mg/dL — ABNORMAL HIGH (ref 0.0–1.2)
Total Protein: 6.8 g/dL (ref 6.5–8.1)

## 2023-12-08 LAB — CBC
HCT: 26.7 % — ABNORMAL LOW (ref 36.0–46.0)
Hemoglobin: 9.3 g/dL — ABNORMAL LOW (ref 12.0–15.0)
MCH: 33.3 pg (ref 26.0–34.0)
MCHC: 34.8 g/dL (ref 30.0–36.0)
MCV: 95.7 fL (ref 80.0–100.0)
Platelets: 165 K/uL (ref 150–400)
RBC: 2.79 MIL/uL — ABNORMAL LOW (ref 3.87–5.11)
RDW: 15.7 % — ABNORMAL HIGH (ref 11.5–15.5)
WBC: 6.9 K/uL (ref 4.0–10.5)
nRBC: 0 % (ref 0.0–0.2)

## 2023-12-08 LAB — MAGNESIUM: Magnesium: 1.6 mg/dL — ABNORMAL LOW (ref 1.7–2.4)

## 2023-12-08 MED ORDER — MAGNESIUM SULFATE 4 GM/100ML IV SOLN
4.0000 g | Freq: Once | INTRAVENOUS | Status: AC
  Administered 2023-12-08: 4 g via INTRAVENOUS
  Filled 2023-12-08: qty 100

## 2023-12-08 MED ORDER — QUETIAPINE FUMARATE 25 MG PO TABS
25.0000 mg | ORAL_TABLET | Freq: Every day | ORAL | Status: AC
  Administered 2023-12-08 – 2024-02-15 (×70): 25 mg via ORAL
  Filled 2023-12-08 (×63): qty 1

## 2023-12-08 NOTE — Progress Notes (Signed)
 Progress Note   Patient: Joan Wood FMW:[id number] DOB: [birth year] DOA: 10/03/2023     66 DOS: the patient was seen and examined on 12/08/2023   Brief hospital course: 53 y.o. female with a history of decompensated liver cirrhosis, PUD, gastric ulcer perforation status post ex lap, GERD, pancreatitis, obstructive sleep apnea. Patient presented secondary to nausea, coffee-ground emesis, bloody diarrhea with concern for upper GI bleed. 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 Hospitalization complicated by hepatic encephalopathy treated with lactulose  and rifaximin . Patient also requiring recurrent paracenteses for her recurrent ascites.   Assessment and Plan: Decompensated alcoholic cirrhosis with recurrent ascites Underwent therapeutic paracentesis on 11/12/2023 with removal of 750 cc of ascitic fluid.  Ascitic fluid was not consistent with SBP.  Received IV Rocephin  for UTI.  Patient was initially treated with rifaximin  and lactulose  for encephalopathy.  At this time, only on lactulose  has been continued since rifaximin  will not be covered at the outpatient facility.  Patient underwent  ultrasound-guided paracentesis on 11/24/2023 again with removal of 3.6 L of fluid and will be considered for repeat paracentesis today.  Dr. Sonjia spoke with interventional radiology on 11/28/23 about potential peritoneal drain placement since she had it in the past but given her current circumstances and need for follow-up and management of drains, it is unlikely that patient will be able to do unless she has assistance at home, or goes to skilled nursing facility and somebody is there to manage her peritoneal drain.  Patient does have impaired memory and understanding of the situation. Patient need someone to take care of PleurX catheter before inserting it per  IR -Recent MELD of 35 with correlates to roughly 50% 3 month mortality. -Abd had been becoming more distended. Pt is s/p paracentesis on 11/28 yielding 4.3L   Hepatic encephalopathy Improved.  Continue lactulose  to keep bowel movements 3-4 times a day.  States that she has been having good bowel movements.   Enterobacter UTI. Treated with Bactrim  DS twice daily and has completed the course   Hypokalemia Cont to monitor   Hypomagnesemia.   Cont to replace as needed   Hypoalbuminemia Secondary to underlying liver disease.  Causing peripheral edema and recurrent ascites.   Thrombocytopenia Secondary to liver disease.  Latest CBC showed hemoglobin of 9.0 and stable and Platelet of 134 and improving.   Acute kidney injury Presumed secondary to ATN from contrast. Nephrology was consulted and renal function has improved also.    Acute appendicitis General surgery was consulted and patient was thought to be high surgical risk so was treated conservatively with antibiotics.  No further abdominal pain.   Chronic hypotension Has received albumin  intermittently.  Currently on midodrine  20 milligram 3 times daily.  Latest blood pressure of 111/64 and has remained stable.   Chronic hyponatremia Patient's baseline appears to be around 125 -128.  Latest sodium of 126.  Nephrology also saw the patient during hospitalization.   Acute blood loss anemia secondary to acute upper GI bleed from esophagitis and esophageal necrosis Patient was seen by GI during hospitalization and underwent upper GI endoscopy on 10/04/2023 with findings of grade D esophagitis in addition to concern for acute esophageal necrosis. Non-bleeding gastric ulcer noted GI recommended Carafate  for 2 weeks and Protonix  twice daily with outpatient GI follow-up.  Patient received 4 units of packed RBC during this hospitalization.   Latest hemoglobin of 8.9 and  has remained stable.  Will transfuse if necessary.    Lower extremity  edema, right ankle tenderness. Peripheral neuropathy Secondary to hypoalbuminemia from liver disease.  Duplex ultrasound of the lower extremity negative for DVT.   x-ray of the right ankle without any bony issues.  Gabapentin  dose has been increased from 300 milligram to 600 mg 3 times daily for the last few days and has been tolerating.  Will need to closely monitor for sedation..  Continue Lasix  40 mg twice daily.  Slightly elevated uric acid level.  Received colchicine  0.6 mg daily for few days.  Currently off colchicine .   Tobacco abuse Counseling done   History of alcohol  abuse No active issues.  Counseling done.  Continue multivitamin and thiamine .  No signs of withdrawal.   Severe protein malnutrition Nutrition Status: Body mass index is 22.62 kg/m.  Nutrition Problem: Severe Malnutrition Etiology: chronic illness (cirrhosis) Signs/Symptoms: severe fat depletion, severe muscle depletion Interventions: Ensure Enlive (each supplement provides 350kcal and 20 grams of protein), MVI, Liberalize Diet, Magic cup Continue nutritional supplement.  Continue multivitamin thiamine  and folic acid .   Pressure injury sacrum stage II pressure Wound 10/15/23 0900 Pressure Injury Sacrum Mid Stage 2 -  Partial thickness loss of dermis presenting as a shallow open injury with a red, pink wound bed without slough. (Active)  Unclear if present on admission but continue wound care.    Goals of care Plan for skilled nursing facility for rehabitation.  Difficult to place -Palliative Care re-consulted to assist with continued goals of care  Insomnia -Pt reports not sleeping very well -Agreeable to trial of seroquel.  -most recent EKG reviewed. QTc unremarkable      Subjective: Reports not sleeping very well  Physical Exam: Vitals:   12/07/23 2351 12/08/23 0459 12/08/23 0615 12/08/23 0754  BP: 106/65 92/64 (!) 103/59 107/73  Pulse: 95 (!) 105 96 (!) 103  Resp: 17 17  17   Temp: 97.9 F (36.6  C) 98.7 F (37.1 C)  98.7 F (37.1 C)  TempSrc: Oral Oral  Oral  SpO2: 100% 100%  99%  Weight:      Height:       General exam: Awake, laying in bed, in nad Respiratory system: Normal respiratory effort, no wheezing Cardiovascular system: regular rate, s1, s2 Gastrointestinal system: Soft, nondistended, positive BS Central nervous system: CN2-12 grossly intact, strength intact Extremities: Perfused, no clubbing Skin: Normal skin turgor, no notable skin lesions seen Psychiatry: Mood normal // affect seems normal  Data Reviewed:  Labs reviewed: Na 127, K 4.1, Cr 0.57, WBC 6.9, Hgb 9.3, Plts 165  Family Communication: Pt in room, family not at bedside  Disposition: Status is: Inpatient Remains inpatient appropriate because: severity of illness  Planned Discharge Destination: Skilled nursing facility    Author: Garnette Pelt, MD 12/08/2023 12:40 PM  For on call review [email or web address].

## 2023-12-08 NOTE — Plan of Care (Signed)

## 2023-12-09 ENCOUNTER — Inpatient Hospital Stay (HOSPITAL_COMMUNITY)

## 2023-12-09 DIAGNOSIS — K7460 Unspecified cirrhosis of liver: Secondary | ICD-10-CM | POA: Diagnosis not present

## 2023-12-09 DIAGNOSIS — K7290 Hepatic failure, unspecified without coma: Secondary | ICD-10-CM | POA: Diagnosis not present

## 2023-12-09 DIAGNOSIS — R112 Nausea with vomiting, unspecified: Secondary | ICD-10-CM | POA: Diagnosis not present

## 2023-12-09 DIAGNOSIS — D62 Acute posthemorrhagic anemia: Secondary | ICD-10-CM | POA: Diagnosis not present

## 2023-12-09 DIAGNOSIS — K922 Gastrointestinal hemorrhage, unspecified: Secondary | ICD-10-CM | POA: Diagnosis not present

## 2023-12-09 DIAGNOSIS — R197 Diarrhea, unspecified: Secondary | ICD-10-CM | POA: Diagnosis not present

## 2023-12-09 LAB — CBC
HCT: 23.2 % — ABNORMAL LOW (ref 36.0–46.0)
Hemoglobin: 8.3 g/dL — ABNORMAL LOW (ref 12.0–15.0)
MCH: 33.5 pg (ref 26.0–34.0)
MCHC: 35.8 g/dL (ref 30.0–36.0)
MCV: 93.5 fL (ref 80.0–100.0)
Platelets: 136 K/uL — ABNORMAL LOW (ref 150–400)
RBC: 2.48 MIL/uL — ABNORMAL LOW (ref 3.87–5.11)
RDW: 15.7 % — ABNORMAL HIGH (ref 11.5–15.5)
WBC: 4.7 K/uL (ref 4.0–10.5)
nRBC: 0 % (ref 0.0–0.2)

## 2023-12-09 LAB — COMPREHENSIVE METABOLIC PANEL WITH GFR
ALT: 26 U/L (ref 0–44)
AST: 65 U/L — ABNORMAL HIGH (ref 15–41)
Albumin: 2 g/dL — ABNORMAL LOW (ref 3.5–5.0)
Alkaline Phosphatase: 71 U/L (ref 38–126)
Anion gap: 9 (ref 5–15)
BUN: 16 mg/dL (ref 6–20)
CO2: 22 mmol/L (ref 22–32)
Calcium: 8 mg/dL — ABNORMAL LOW (ref 8.9–10.3)
Chloride: 95 mmol/L — ABNORMAL LOW (ref 98–111)
Creatinine, Ser: 0.66 mg/dL (ref 0.44–1.00)
GFR, Estimated: >60 mL/min (ref >60)
Glucose, Bld: 108 mg/dL — ABNORMAL HIGH (ref 70–99)
Potassium: 4 mmol/L (ref 3.5–5.1)
Sodium: 126 mmol/L — ABNORMAL LOW (ref 135–145)
Total Bilirubin: 1.4 mg/dL — ABNORMAL HIGH (ref 0.0–1.2)
Total Protein: 6.1 g/dL — ABNORMAL LOW (ref 6.5–8.1)

## 2023-12-09 LAB — MAGNESIUM: Magnesium: 1.8 mg/dL (ref 1.7–2.4)

## 2023-12-09 LAB — AMMONIA: Ammonia: 60 umol/L — ABNORMAL HIGH (ref 9–35)

## 2023-12-09 NOTE — Plan of Care (Signed)

## 2023-12-09 NOTE — Progress Notes (Signed)
 This Investment Banker, Operational found patient doing yoga at 0200 check in front of bathroom door covered in stool. Patient stated I didn't think I could make it to toilet, so I got the mat out and did it here I did not fall don't tell them I fell  Patient did not appear as if she had fallen, she was in a yoga pose covered in stool.  Prior to this, patient had been asleep off/on since 2300.  Of note Seroquel was started last night to aid in sleep.   Patient was then cleaned up, educated on using call light, asking for help, & placed back in the bed with the bed alarm on.  Patient endorsed not wanting the bed alarm on as she was ambulatory prior to this. This Investment Banker, Operational educated her on safety and asking for help. Independent, ambulatory privileges can be reassessed by the day team.

## 2023-12-09 NOTE — Progress Notes (Signed)
 Progress Note   Patient: Joan Wood FMW:[id number] DOB: [birth year] DOA: 10/03/2023     67 DOS: the patient was seen and examined on 12/09/2023   Brief hospital course: 53 y.o. female with a history of decompensated liver cirrhosis, PUD, gastric ulcer perforation status post ex lap, GERD, pancreatitis, obstructive sleep apnea. Patient presented secondary to nausea, coffee-ground emesis, bloody diarrhea with concern for upper GI bleed. 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 Hospitalization complicated by hepatic encephalopathy treated with lactulose  and rifaximin . Patient also requiring recurrent paracenteses for her recurrent ascites.   Assessment and Plan: Decompensated alcoholic cirrhosis with recurrent ascites Underwent therapeutic paracentesis on 11/12/2023 with removal of 750 cc of ascitic fluid.  Ascitic fluid was not consistent with SBP.  Received IV Rocephin  for UTI.  Patient was initially treated with rifaximin  and lactulose  for encephalopathy.  At this time, only on lactulose  has been continued since rifaximin  will not be covered at the outpatient facility.  Patient underwent  ultrasound-guided paracentesis on 11/24/2023 again with removal of 3.6 L of fluid and will be considered for repeat paracentesis today.  Dr. Sonjia spoke with interventional radiology on 11/28/23 about potential peritoneal drain placement since she had it in the past but given her current circumstances and need for follow-up and management of drains, it is unlikely that patient will be able to do unless she has assistance at home, or goes to skilled nursing facility and somebody is there to manage her peritoneal drain.  Patient does have impaired memory and understanding of the situation. Patient need someone to take care of PleurX catheter before inserting it per  IR -Recent MELD of 35 with correlates to roughly 50% 3 month mortality. -Pt is s/p paracentesis on 11/28 yielding 4.3L. Cont to monitor   Hepatic encephalopathy Improved.  Continue lactulose  to keep bowel movements 3-4 times a day.  States that she has been having good bowel movements.   Enterobacter UTI. Treated with Bactrim  DS twice daily and has completed the course   Hypokalemia Cont to monitor   Hypomagnesemia.   Cont to replace as needed   Hypoalbuminemia Secondary to underlying liver disease.  Causing peripheral edema and recurrent ascites.   Thrombocytopenia Secondary to liver disease.  Latest CBC showed hemoglobin of 9.0 and stable and Platelet of 134 and improving.   Acute kidney injury Presumed secondary to ATN from contrast. Nephrology was consulted and renal function has improved also.    Acute appendicitis General surgery was consulted and patient was thought to be high surgical risk so was treated conservatively with antibiotics.  No further abdominal pain.   Chronic hypotension Has received albumin  intermittently.  Currently on midodrine  20 milligram 3 times daily.  Latest blood pressure of 111/64 and has remained stable.   Chronic hyponatremia Patient's baseline appears to be around 125 -128.  Latest sodium of 126.  Nephrology also saw the patient during hospitalization.   Acute blood loss anemia secondary to acute upper GI bleed from esophagitis and esophageal necrosis Patient was seen by GI during hospitalization and underwent upper GI endoscopy on 10/04/2023 with findings of grade D esophagitis in addition to concern for acute esophageal necrosis. Non-bleeding gastric ulcer noted GI recommended Carafate  for 2 weeks and Protonix  twice daily with outpatient GI follow-up.  Patient received 4 units of packed RBC during this hospitalization.   Latest hemoglobin of 8.9 and has remained stable.  Will transfuse if necessary.    Lower extremity edema, right ankle  tenderness. Peripheral neuropathy Secondary to hypoalbuminemia from liver disease.  Duplex ultrasound of the lower extremity negative for DVT.   x-ray of the right ankle without any bony issues.  Gabapentin  dose has been increased from 300 milligram to 600 mg 3 times daily for the last few days and has been tolerating.  Will need to closely monitor for sedation..  Continue Lasix  40 mg twice daily.  Slightly elevated uric acid level.  Received colchicine  0.6 mg daily for few days.  Currently off colchicine .   Tobacco abuse Counseling done   History of alcohol  abuse No active issues.  Counseling done.  Continue multivitamin and thiamine .  No signs of withdrawal.   Severe protein malnutrition Nutrition Status: Body mass index is 22.62 kg/m.  Nutrition Problem: Severe Malnutrition Etiology: chronic illness (cirrhosis) Signs/Symptoms: severe fat depletion, severe muscle depletion Interventions: Ensure Enlive (each supplement provides 350kcal and 20 grams of protein), MVI, Liberalize Diet, Magic cup Continue nutritional supplement.  Continue multivitamin thiamine  and folic acid .   Pressure injury sacrum stage II pressure Wound 10/15/23 0900 Pressure Injury Sacrum Mid Stage 2 -  Partial thickness loss of dermis presenting as a shallow open injury with a red, pink wound bed without slough. (Active)  Unclear if present on admission but continue wound care.    Goals of care Plan for skilled nursing facility for rehabitation.  Difficult to place -Palliative Care re-consulted to assist with continued goals of care  Insomnia -Pt reports not sleeping very well -Agreeable to trial of seroquel.  -most recent EKG reviewed. QTc unremarkable  Fall -See RN documentation. Pt found on ground in feces overnight -This AM, pt complained of R hip pain. RN states pt has been complaining of B ankle pains as well. Have ordered B hip and B ankle xrays      Subjective: Pt found on ground overnight covered in  feces, see RN documentation. Currently complains of R hip pain. RN reports pt has been complaining of B ankle pains   Physical Exam: Vitals:   12/08/23 2008 12/08/23 2354 12/09/23 0333 12/09/23 0743  BP: 113/71 (!) 99/57 (!) 99/55 (!) 100/58  Pulse: 91 100 100 100  Resp: 14 15 15 16   Temp: 98.8 F (37.1 C) 98.8 F (37.1 C) 99.1 F (37.3 C) 98.6 F (37 C)  TempSrc: Oral Axillary Oral Oral  SpO2: 100% 100% 100% 100%  Weight:      Height:       General exam: Conversant, in no acute distress Respiratory system: normal chest rise, clear, no audible wheezing Cardiovascular system: regular rhythm, s1-s2 Gastrointestinal system: Nondistended, nontender, pos BS Central nervous system: No seizures, no tremors Extremities: No cyanosis, no joint deformities Skin: No rashes, no pallor Psychiatry: Affect normal // no auditory hallucinations   Data Reviewed:  Labs reviewed: Na 126, K 4.0, Cr 0.66, TB 1.4, WBC 4.7, Hgb 8.3, Plts 136  Family Communication: Pt in room, family not at bedside  Disposition: Status is: Inpatient Remains inpatient appropriate because: severity of illness  Planned Discharge Destination: Skilled nursing facility    Author: Garnette Pelt, MD 12/09/2023 2:38 PM  For on call review [email or web address].

## 2023-12-10 DIAGNOSIS — D62 Acute posthemorrhagic anemia: Secondary | ICD-10-CM | POA: Diagnosis not present

## 2023-12-10 DIAGNOSIS — K922 Gastrointestinal hemorrhage, unspecified: Secondary | ICD-10-CM | POA: Diagnosis not present

## 2023-12-10 DIAGNOSIS — R197 Diarrhea, unspecified: Secondary | ICD-10-CM | POA: Diagnosis not present

## 2023-12-10 DIAGNOSIS — K7290 Hepatic failure, unspecified without coma: Secondary | ICD-10-CM | POA: Diagnosis not present

## 2023-12-10 DIAGNOSIS — R112 Nausea with vomiting, unspecified: Secondary | ICD-10-CM | POA: Diagnosis not present

## 2023-12-10 DIAGNOSIS — K7460 Unspecified cirrhosis of liver: Secondary | ICD-10-CM | POA: Diagnosis not present

## 2023-12-10 LAB — COMPREHENSIVE METABOLIC PANEL WITH GFR
ALT: 25 U/L (ref 0–44)
AST: 62 U/L — ABNORMAL HIGH (ref 15–41)
Albumin: 1.8 g/dL — ABNORMAL LOW (ref 3.5–5.0)
Alkaline Phosphatase: 67 U/L (ref 38–126)
Anion gap: 9 (ref 5–15)
BUN: 13 mg/dL (ref 6–20)
CO2: 23 mmol/L (ref 22–32)
Calcium: 8 mg/dL — ABNORMAL LOW (ref 8.9–10.3)
Chloride: 99 mmol/L (ref 98–111)
Creatinine, Ser: 0.63 mg/dL (ref 0.44–1.00)
GFR, Estimated: >60 mL/min (ref >60)
Glucose, Bld: 107 mg/dL — ABNORMAL HIGH (ref 70–99)
Potassium: 3.4 mmol/L — ABNORMAL LOW (ref 3.5–5.1)
Sodium: 131 mmol/L — ABNORMAL LOW (ref 135–145)
Total Bilirubin: 1.3 mg/dL — ABNORMAL HIGH (ref 0.0–1.2)
Total Protein: 5.8 g/dL — ABNORMAL LOW (ref 6.5–8.1)

## 2023-12-10 LAB — MAGNESIUM: Magnesium: 1.6 mg/dL — ABNORMAL LOW (ref 1.7–2.4)

## 2023-12-10 LAB — CBC
HCT: 23.8 % — ABNORMAL LOW (ref 36.0–46.0)
Hemoglobin: 8.1 g/dL — ABNORMAL LOW (ref 12.0–15.0)
MCH: 32 pg (ref 26.0–34.0)
MCHC: 34 g/dL (ref 30.0–36.0)
MCV: 94.1 fL (ref 80.0–100.0)
Platelets: 138 K/uL — ABNORMAL LOW (ref 150–400)
RBC: 2.53 MIL/uL — ABNORMAL LOW (ref 3.87–5.11)
RDW: 15.6 % — ABNORMAL HIGH (ref 11.5–15.5)
WBC: 3.1 K/uL — ABNORMAL LOW (ref 4.0–10.5)
nRBC: 0 % (ref 0.0–0.2)

## 2023-12-10 MED ORDER — POTASSIUM CHLORIDE CRYS ER 20 MEQ PO TBCR
20.0000 meq | EXTENDED_RELEASE_TABLET | Freq: Once | ORAL | Status: AC
  Administered 2023-12-10: 20 meq via ORAL
  Filled 2023-12-10: qty 1

## 2023-12-10 MED ORDER — POTASSIUM CHLORIDE 20 MEQ PO PACK
40.0000 meq | PACK | Freq: Once | ORAL | Status: AC
  Administered 2023-12-10: 40 meq via ORAL
  Filled 2023-12-10: qty 2

## 2023-12-10 MED ORDER — MAGNESIUM SULFATE 2 GM/50ML IV SOLN
2.0000 g | Freq: Once | INTRAVENOUS | Status: AC
  Administered 2023-12-10: 2 g via INTRAVENOUS
  Filled 2023-12-10: qty 50

## 2023-12-10 NOTE — Plan of Care (Signed)
  Problem: Clinical Measurements: Goal: Ability to maintain clinical measurements within normal limits will improve Outcome: Progressing Goal: Will remain free from infection Outcome: Progressing Goal: Diagnostic test results will improve Outcome: Progressing Goal: Respiratory complications will improve Outcome: Progressing Goal: Cardiovascular complication will be avoided Outcome: Progressing   Problem: Health Behavior/Discharge Planning: Goal: Ability to manage health-related needs will improve Outcome: Not Progressing

## 2023-12-10 NOTE — Plan of Care (Signed)
  Problem: Elimination: Goal: Will not experience complications related to bowel motility Outcome: Progressing Goal: Will not experience complications related to urinary retention Outcome: Progressing   Problem: Safety: Goal: Ability to remain free from injury will improve Outcome: Progressing   Problem: Skin Integrity: Goal: Risk for impaired skin integrity will decrease Outcome: Progressing   Problem: Health Behavior/Discharge Planning: Goal: Ability to manage health-related needs will improve Outcome: Not Progressing   Problem: Clinical Measurements: Goal: Ability to maintain clinical measurements within normal limits will improve Outcome: Not Progressing   Problem: Activity: Goal: Risk for activity intolerance will decrease Outcome: Not Progressing   Problem: Pain Managment: Goal: General experience of comfort will improve and/or be controlled Outcome: Not Progressing

## 2023-12-10 NOTE — Progress Notes (Signed)
 Progress Note   Patient: Joan Wood FMW:[id number] DOB: 02-14-1970 DOA: 10/03/2023     68 DOS: the patient was seen and examined on 12/10/2023   Brief hospital course: 53 y.o. female with a history of decompensated liver cirrhosis, PUD, gastric ulcer perforation status post ex lap, GERD, pancreatitis, obstructive sleep apnea. Patient presented secondary to nausea, coffee-ground emesis, bloody diarrhea with concern for upper GI bleed. 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 Hospitalization complicated by hepatic encephalopathy treated with lactulose  and rifaximin . Patient also requiring recurrent paracenteses for her recurrent ascites.   Assessment and Plan: Decompensated alcoholic cirrhosis with recurrent ascites Underwent therapeutic paracentesis on 11/12/2023 with removal of 750 cc of ascitic fluid.  Ascitic fluid was not consistent with SBP.  Received IV Rocephin  for UTI.  Patient was initially treated with rifaximin  and lactulose  for encephalopathy.  At this time, only on lactulose  has been continued since rifaximin  will not be covered at the outpatient facility.  Patient underwent  ultrasound-guided paracentesis on 11/24/2023 again with removal of 3.6 L of fluid and will be considered for repeat paracentesis today.  Dr. Sonjia spoke with interventional radiology on 11/28/23 about potential peritoneal drain placement since she had it in the past but given her current circumstances and need for follow-up and management of drains, it is unlikely that patient will be able to do unless she has assistance at home, or goes to skilled nursing facility and somebody is there to manage her peritoneal drain.  Patient does have impaired memory and understanding of the situation. Patient need someone to take care of PleurX catheter before inserting it per  IR -Recent MELD of 35 with correlates to roughly 50% 3 month mortality. -Pt is s/p paracentesis on 11/28 yielding 4.3L. Cont to monitor   Hepatic encephalopathy Improved.  Continue lactulose  to keep bowel movements 3-4 times a day.  States that she has been having good bowel movements.   Enterobacter UTI. Treated with Bactrim  DS twice daily and has completed the course   Hypokalemia Cont to monitor   Hypomagnesemia.   Cont to replace as needed   Hypoalbuminemia Secondary to underlying liver disease.  Causing peripheral edema and recurrent ascites.   Thrombocytopenia Secondary to liver disease.  Latest CBC showed hemoglobin of 9.0 and stable and Platelet of 134 and improving.   Acute kidney injury Presumed secondary to ATN from contrast. Nephrology was consulted and renal function has improved also.    Acute appendicitis General surgery was consulted and patient was thought to be high surgical risk so was treated conservatively with antibiotics.  No further abdominal pain.   Chronic hypotension Has received albumin  intermittently.  Currently on midodrine  20 milligram 3 times daily.  Latest blood pressure of 111/64 and has remained stable.   Chronic hyponatremia Patient's baseline appears to be around 125 -128.   Sodium improved to 131 today   Acute blood loss anemia secondary to acute upper GI bleed from esophagitis and esophageal necrosis Patient was seen by GI during hospitalization and underwent upper GI endoscopy on 10/04/2023 with findings of grade D esophagitis in addition to concern for acute esophageal necrosis. Non-bleeding gastric ulcer noted GI recommended Carafate  for 2 weeks and Protonix  twice daily with outpatient GI follow-up.  Patient received 4 units of packed RBC during this hospitalization.   Latest hemoglobin of 8.9 and has remained stable.  Will transfuse if necessary.  Lower extremity edema, right ankle tenderness. Peripheral neuropathy Secondary to  hypoalbuminemia from liver disease.  Duplex ultrasound of the lower extremity negative for DVT.   x-ray of the right ankle without any bony issues.  Gabapentin  dose has been increased from 300 milligram to 600 mg 3 times daily for the last few days and has been tolerating.  Will need to closely monitor for sedation..  Continue Lasix  40 mg twice daily.  Slightly elevated uric acid level.  Received colchicine  0.6 mg daily for few days.  Currently off colchicine .   Tobacco abuse Counseling done   History of alcohol  abuse No active issues.  Counseling done.  Continue multivitamin and thiamine .  No signs of withdrawal.   Severe protein malnutrition Nutrition Status: Body mass index is 22.62 kg/m.  Nutrition Problem: Severe Malnutrition Etiology: chronic illness (cirrhosis) Signs/Symptoms: severe fat depletion, severe muscle depletion Interventions: Ensure Enlive (each supplement provides 350kcal and 20 grams of protein), MVI, Liberalize Diet, Magic cup Continue nutritional supplement.  Continue multivitamin thiamine  and folic acid .   Pressure injury sacrum stage II pressure Wound 10/15/23 0900 Pressure Injury Sacrum Mid Stage 2 -  Partial thickness loss of dermis presenting as a shallow open injury with a red, pink wound bed without slough. (Active)  Unclear if present on admission but continue wound care.    Goals of care Plan for skilled nursing facility for rehabitation.  Difficult to place -Palliative Care re-consulted to assist with continued goals of care  Insomnia -Pt reports not sleeping very well -Agreeable to trial of seroquel .  -most recent EKG reviewed. QTc unremarkable  Fall -See RN documentation. Pt found on ground in feces on the early morning of 11/30 -B hip and B ankle xrays reviewed, no acute fractures      Subjective: Without complaints today  Physical Exam: Vitals:   12/10/23 0434 12/10/23 0826 12/10/23 1155 12/10/23 1540  BP: (!) 92/52 101/70 107/61 96/60   Pulse: 98 (!) 101 81 87  Resp: 18 18 18 18   Temp: 99.1 F (37.3 C) 97.9 F (36.6 C) 97.6 F (36.4 C) 98.6 F (37 C)  TempSrc:  Axillary Axillary Oral  SpO2: 98% 100% 100% 100%  Weight:      Height:       General exam: Awake, laying in bed, in nad Respiratory system: Normal respiratory effort, no wheezing Cardiovascular system: regular rate, s1, s2 Gastrointestinal system: Soft, nondistended, positive BS Central nervous system: CN2-12 grossly intact, strength intact Extremities: Perfused, no clubbing Skin: Normal skin turgor, no notable skin lesions seen Psychiatry: Mood normal // no visual hallucinations   Data Reviewed:  Labs reviewed: Na 131, K 3.4, Cr 0.63, WBC 3.1, Hgb 8.1, Plts 138  Family Communication: Pt in room, family not at bedside  Disposition: Status is: Inpatient Remains inpatient appropriate because: severity of illness  Planned Discharge Destination: Skilled nursing facility    Author: Garnette Pelt, MD 12/10/2023 3:56 PM  For on call review [email or web address]

## 2023-12-10 NOTE — Progress Notes (Signed)
 Patient ID: Joan Wood, female   DOB: 11-19-1970, 53 y.o.   MRN: [id number]    Progress Note from the Palliative Medicine Team at [facility]   Patient Name: Joan Wood        Date: 12/10/2023 DOB: [birth year]  Age: 53 y.o. MRN#: [id number] Attending Physician: Cindy Garnette POUR, MD Primary Care Physician: Edman Meade PEDLAR, FNP Admit Date: 10/03/2023   Reason for Consultation/Follow-up   Establishing Goals of Care   HPI/ Brief Hospital Review     Subjective  Extensive chart review has been completed prior to meeting with patient/family  including labs, vital signs, imaging, progress/consult notes, orders, medications and available advance directive documents.    This NP assessed patient at the bedside as a follow up to  yesterday's GOCs meeting.       Education offered today regarding  the importance of continued conversation with family and their  medical providers regarding overall plan of care and treatment options,  ensuring decisions are within the context of the patients values and GOCs.  Questions and concerns addressed   Discussed with primary team and nursing staff   Time:   minutes  Detailed review of medical records ( labs, imaging, vital signs), medically appropriate exam ( MS, skin, cardiac,  resp)   discussed with treatment team, counseling and education to patient, family, staff, documenting clinical information, medication management, coordination of care    Ronal Plants NP  Palliative Medicine Team Team Phone # [phone] Pager [phone]

## 2023-12-10 NOTE — Progress Notes (Signed)
 VAST consult received to obtain PIV access. Pt currently has no IV meds/fluids or studies ordered. Education provided to nurse and MD  regarding hospital policy and best practice to place IV access only when needed to allow for vein preservation and reduce the risk of infection from unneccessary invasive procedures and idle lines. Pt's nurse further educated if pt's condition changes and IV access is needed emergently, IV team consult should be placed STAT with a comment as to why an emergent IV is needed.

## 2023-12-11 ENCOUNTER — Ambulatory Visit: Admitting: Cardiology

## 2023-12-11 DIAGNOSIS — K7290 Hepatic failure, unspecified without coma: Secondary | ICD-10-CM | POA: Diagnosis not present

## 2023-12-11 DIAGNOSIS — K7460 Unspecified cirrhosis of liver: Secondary | ICD-10-CM | POA: Diagnosis not present

## 2023-12-11 DIAGNOSIS — D62 Acute posthemorrhagic anemia: Secondary | ICD-10-CM | POA: Diagnosis not present

## 2023-12-11 DIAGNOSIS — R197 Diarrhea, unspecified: Secondary | ICD-10-CM | POA: Diagnosis not present

## 2023-12-11 DIAGNOSIS — R112 Nausea with vomiting, unspecified: Secondary | ICD-10-CM | POA: Diagnosis not present

## 2023-12-11 NOTE — Progress Notes (Signed)
 Progress Note   Patient: Joan Wood FMW:[id number] DOB: 01-22-1970 DOA: 10/03/2023     69 DOS: the patient was seen and examined on 12/11/2023   Brief hospital course: 53 y.o. female with a history of decompensated liver cirrhosis, PUD, gastric ulcer perforation status post ex lap, GERD, pancreatitis, obstructive sleep apnea. Patient presented secondary to nausea, coffee-ground emesis, bloody diarrhea with concern for upper GI bleed. 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 Hospitalization complicated by hepatic encephalopathy treated with lactulose  and rifaximin . Patient also requiring recurrent paracenteses for her recurrent ascites.   Assessment and Plan: Decompensated alcoholic cirrhosis with recurrent ascites Underwent therapeutic paracentesis on 11/12/2023 with removal of 750 cc of ascitic fluid.  Ascitic fluid was not consistent with SBP.  Received IV Rocephin  for UTI.  Patient was initially treated with rifaximin  and lactulose  for encephalopathy.  At this time, only on lactulose  has been continued since rifaximin  will not be covered at the outpatient facility.  Patient underwent  ultrasound-guided paracentesis on 11/24/2023 again with removal of 3.6 L of fluid and will be considered for repeat paracentesis today.  Dr. Sonjia spoke with interventional radiology on 11/28/23 about potential peritoneal drain placement since she had it in the past but given her current circumstances and need for follow-up and management of drains, it is unlikely that patient will be able to do unless she has assistance at home, or goes to skilled nursing facility and somebody is there to manage her peritoneal drain.  Patient does have impaired memory and understanding of the situation.  -Recent MELD of 35 with correlates to roughly 50% 3 month mortality. -Pt is  s/p paracentesis on 11/28 yielding 4.3L.  -Had considered PleurX catheter. Discussed with IR. Concern that Pleurix is typically done for refractory ascites at end of life given high occlusion rate. Pt seems to require paracentesis q2-3 weeks.  -D/c planning is in progress for SNF   Hepatic encephalopathy Improved.  Continue lactulose  to keep bowel movements 3-4 times a day.  States that she has been having good bowel movements.   Enterobacter UTI. Treated with Bactrim  DS twice daily and has completed the course   Hypokalemia Cont to monitor   Hypomagnesemia.   Cont to replace as needed   Hypoalbuminemia Secondary to underlying liver disease.  Causing peripheral edema and recurrent ascites.   Thrombocytopenia Secondary to liver disease.   -Stable   Acute kidney injury Presumed secondary to ATN from contrast. Nephrology was consulted and renal function has improved also.    Acute appendicitis General surgery was consulted and patient was thought to be high surgical risk so was treated conservatively with antibiotics.  No further abdominal pain.   Chronic hypotension Has received albumin  intermittently.  Currently on midodrine  20 milligram 3 times daily.  Latest blood pressure of 111/64 and has remained stable.   Chronic hyponatremia Patient's baseline appears to be around 125 -128.   Sodium improved to 131 today   Acute blood loss anemia secondary to acute upper GI bleed from esophagitis and esophageal necrosis Patient was seen by GI during hospitalization and underwent upper GI endoscopy on 10/04/2023 with findings of grade D esophagitis in addition to concern for acute esophageal necrosis. Non-bleeding gastric ulcer noted GI recommended Carafate  for 2 weeks and Protonix  twice daily with outpatient GI follow-up.  Patient received 4 units of packed RBC during this hospitalization.   Latest hemoglobin  of 8.9 and has remained stable.  Will transfuse if necessary.    Lower extremity  edema, right ankle tenderness. Peripheral neuropathy Secondary to hypoalbuminemia from liver disease.  Duplex ultrasound of the lower extremity negative for DVT.   x-ray of the right ankle without any bony issues.  Gabapentin  dose has been increased from 300 milligram to 600 mg 3 times daily for the last few days and has been tolerating.  Will need to closely monitor for sedation..  Continue Lasix  40 mg twice daily.  Slightly elevated uric acid level.  Received colchicine  0.6 mg daily for few days.  Currently off colchicine .   Tobacco abuse Counseling done   History of alcohol  abuse No active issues.  Counseling done.  Continue multivitamin and thiamine .  No signs of withdrawal.   Severe protein malnutrition Nutrition Status: Body mass index is 22.62 kg/m.  Nutrition Problem: Severe Malnutrition Etiology: chronic illness (cirrhosis) Signs/Symptoms: severe fat depletion, severe muscle depletion Interventions: Ensure Enlive (each supplement provides 350kcal and 20 grams of protein), MVI, Liberalize Diet, Magic cup Continue nutritional supplement.  Continue multivitamin thiamine  and folic acid .   Pressure injury sacrum stage II pressure Wound 10/15/23 0900 Pressure Injury Sacrum Mid Stage 2 -  Partial thickness loss of dermis presenting as a shallow open injury with a red, pink wound bed without slough. (Active)  Unclear if present on admission but continue wound care.    Goals of care Plan for skilled nursing facility for rehabitation.  Difficult to place -Palliative Care re-consulted to assist with continued goals of care  Insomnia -Pt reports not sleeping very well -Agreeable to trial of seroquel .  -most recent EKG reviewed. QTc unremarkable  Fall -See RN documentation. Pt found on ground in feces on the early morning of 11/30 -B hip and B ankle xrays reviewed, no acute fractures      Subjective: Asking about liberalizing fluid restrictions  Physical Exam: Vitals:    12/10/23 2323 12/11/23 0342 12/11/23 0804 12/11/23 1140  BP: 100/62 (!) 93/57 101/69 99/64  Pulse: 87 84 97 93  Resp: 18 18 16 15   Temp: 98.2 F (36.8 C) 98.9 F (37.2 C) 98 F (36.7 C) 98.7 F (37.1 C)  TempSrc:  Oral Oral Oral  SpO2: 100% 99% 100% 99%  Weight:      Height:       General exam: Conversant, in no acute distress Respiratory system: normal chest rise, clear, no audible wheezing Cardiovascular system: regular rhythm, s1-s2 Gastrointestinal system: Nondistended, nontender, pos BS Central nervous system: No seizures, no tremors Extremities: No cyanosis, no joint deformities Skin: No rashes, no pallor Psychiatry: Affect normal // no auditory hallucinations   Data Reviewed:  There are no new results to review at this time.  Family Communication: Pt in room, family not at bedside  Disposition: Status is: Inpatient Remains inpatient appropriate because: severity of illness  Planned Discharge Destination: Skilled nursing facility    Author: Garnette Pelt, MD 12/11/2023 2:58 PM  For on call review [email or web address].

## 2023-12-11 NOTE — Plan of Care (Signed)

## 2023-12-11 NOTE — Progress Notes (Signed)
 Occupational Therapy Treatment Patient Details Name: Joan Wood MRN: [id number] DOB: [birth year] Today's Date: 12/11/2023   History of present illness 53 y.o. female admitted 10/03/2023 with nausea/vomiting and bloody diarrhea. Workup for decompensated alcoholic cirrhosis, ascites, hepatic encephalopathy. S/p upper EGD 9/25 concerning for possible acute esophageal necrosis, portal hypertension, non-bleeding gastric and duodenal ulcers. S/p recurrent paracentesis for recurrent ascites; IR deferred PleurX catheter placement due to social situation. PMH includes decompensated cirrhosis, PUD, GERD, pancreatitis, OSA, peripheral neuropathy, tobacco abuse, alcohol  abuse, severe protein malnutrition.   OT comments  Patient demonstrating good gain with dressing with supervision to donn paper scrub top and min assist with pants.  Patient with complaints of pain at right hip with mobility and required RW for support.  Patient will benefit from continued inpatient follow up therapy, <3 hours/day.  Acute OT to continue to follow to address established goals to facilitate DC to next venue of care.        If plan is discharge home, recommend the following:  A little help with bathing/dressing/bathroom;Direct supervision/assist for medications management;Direct supervision/assist for financial management;Assist for transportation;Supervision due to cognitive status   Equipment Recommendations  BSC/3in1    Recommendations for Other Services      Precautions / Restrictions Precautions Precautions: Fall Recall of Precautions/Restrictions: Impaired Precaution/Restrictions Comments: hx of orthostasis Restrictions Weight Bearing Restrictions Per Provider Order: No       Mobility Bed Mobility Overal bed mobility: Independent             General bed mobility comments: seated on EOB upon entry and left on EOB with breakfast at end of session    Transfers Overall transfer level: Needs  assistance Equipment used: Rolling walker (2 wheels) Transfers: Sit to/from Stand Sit to Stand: Supervision           General transfer comment: RW and supervision on this date due to complaints of right hip pain     Balance Overall balance assessment: Needs assistance Sitting-balance support: No upper extremity supported, Feet supported Sitting balance-Leahy Scale: Good Sitting balance - Comments: no LOB seated EOB   Standing balance support: During functional activity, No upper extremity supported, Bilateral upper extremity supported Standing balance-Leahy Scale: Good Standing balance comment: stood at sink with no UE support for static standing and RW for support with dynamic due to right hip pain                           ADL either performed or assessed with clinical judgement   ADL Overall ADL's : Needs assistance/impaired     Grooming: Wash/dry hands;Wash/dry face;Oral care;Supervision/safety;Standing Grooming Details (indicate cue type and reason): sinkside         Upper Body Dressing : Supervision/safety;Sitting Upper Body Dressing Details (indicate cue type and reason): donned paper scrub top Lower Body Dressing: Minimal assistance;Sit to/from stand Lower Body Dressing Details (indicate cue type and reason): donned paper scrub pants with assistance to pull up over waist             Functional mobility during ADLs: Supervision/safety;Rolling walker (2 wheels) General ADL Comments: patient used RW on this date due to right hip pain    Extremity/Trunk Assessment              Vision       Perception     Praxis     Communication Communication Communication: No apparent difficulties   Cognition Arousal: Alert Behavior During Therapy: [facility] for  tasks assessed/performed Cognition: Cognition impaired     Awareness: Intellectual awareness impaired Memory impairment (select all impairments): Short-term memory, Working memory Attention  impairment (select first level of impairment): Sustained attention Executive functioning impairment (select all impairments): Organization, Reasoning, Problem solving, Initiation                   Following commands: Impaired Following commands impaired: Follows multi-step commands inconsistently      Cueing   Cueing Techniques: Verbal cues, Visual cues  Exercises Exercises: Other exercises Other Exercises Other Exercises: functional mobility in hallway with RW    Shoulder Instructions       General Comments      Pertinent Vitals/ Pain       Pain Assessment Pain Assessment: Faces Faces Pain Scale: Hurts little more Pain Location: right hip Pain Descriptors / Indicators: Aching, Grimacing, Guarding Pain Intervention(s): Limited activity within patient's tolerance, Monitored during session, Repositioned  Home Living                                          Prior Functioning/Environment              Frequency  Min 1X/week        Progress Toward Goals  OT Goals(current goals can now be found in the care plan section)  Progress towards OT goals: Progressing toward goals  Acute Rehab OT Goals Patient Stated Goal: to go home OT Goal Formulation: With patient Time For Goal Achievement: 12/21/23 Potential to Achieve Goals: Fair ADL Goals Pt Will Perform Grooming: Independently;standing Pt Will Perform Lower Body Dressing: with modified independence;with adaptive equipment;sitting/lateral leans;sit to/from stand Pt Will Transfer to Toilet: with modified independence;ambulating;regular height toilet;grab bars Pt Will Perform Toileting - Clothing Manipulation and hygiene: with modified independence;sitting/lateral leans;sit to/from stand Pt/caregiver will Perform Home Exercise Program: Both right and left upper extremity;With written HEP provided;With theraband  Plan      Co-evaluation                 AM-PAC OT 6 Clicks Daily  Activity     Outcome Measure   Help from another person eating meals?: None Help from another person taking care of personal grooming?: A Little Help from another person toileting, which includes using toliet, bedpan, or urinal?: A Little Help from another person bathing (including washing, rinsing, drying)?: A Little Help from another person to put on and taking off regular upper body clothing?: A Little Help from another person to put on and taking off regular lower body clothing?: A Little 6 Click Score: 19    End of Session Equipment Utilized During Treatment: Gait belt;Rolling walker (2 wheels)  OT Visit Diagnosis: Other abnormalities of gait and mobility (R26.89);Muscle weakness (generalized) (M62.81);Pain;Other symptoms and signs involving cognitive function Pain - Right/Left: Right Pain - part of body: Hip   Activity Tolerance Patient tolerated treatment well;Patient limited by pain   Patient Left in bed;with call bell/phone within reach (on EOB)   Nurse Communication Mobility status;Other (comment) (patient complaining of right hip pain)        Time: 9281-9251 OT Time Calculation (min): 30 min  Charges: OT General Charges $OT Visit: 1 Visit OT Treatments $Self Care/Home Management : 8-22 mins $Therapeutic Activity: 8-22 mins  Dick Wood, OTA Acute Rehabilitation Services  Office [phone]   Joan Wood 12/11/2023, 10:03 AM

## 2023-12-12 DIAGNOSIS — R112 Nausea with vomiting, unspecified: Secondary | ICD-10-CM | POA: Diagnosis not present

## 2023-12-12 DIAGNOSIS — K7290 Hepatic failure, unspecified without coma: Secondary | ICD-10-CM | POA: Diagnosis not present

## 2023-12-12 DIAGNOSIS — D62 Acute posthemorrhagic anemia: Secondary | ICD-10-CM | POA: Diagnosis not present

## 2023-12-12 DIAGNOSIS — K7460 Unspecified cirrhosis of liver: Secondary | ICD-10-CM | POA: Diagnosis not present

## 2023-12-12 DIAGNOSIS — K922 Gastrointestinal hemorrhage, unspecified: Secondary | ICD-10-CM | POA: Diagnosis not present

## 2023-12-12 LAB — CBC
HCT: 23.8 % — ABNORMAL LOW (ref 36.0–46.0)
Hemoglobin: 8.2 g/dL — ABNORMAL LOW (ref 12.0–15.0)
MCH: 32.5 pg (ref 26.0–34.0)
MCHC: 34.5 g/dL (ref 30.0–36.0)
MCV: 94.4 fL (ref 80.0–100.0)
Platelets: 141 K/uL — ABNORMAL LOW (ref 150–400)
RBC: 2.52 MIL/uL — ABNORMAL LOW (ref 3.87–5.11)
RDW: 15.7 % — ABNORMAL HIGH (ref 11.5–15.5)
WBC: 4.2 K/uL (ref 4.0–10.5)
nRBC: 0 % (ref 0.0–0.2)

## 2023-12-12 LAB — COMPREHENSIVE METABOLIC PANEL WITH GFR
ALT: 26 U/L (ref 0–44)
AST: 58 U/L — ABNORMAL HIGH (ref 15–41)
Albumin: 1.8 g/dL — ABNORMAL LOW (ref 3.5–5.0)
Alkaline Phosphatase: 68 U/L (ref 38–126)
Anion gap: 9 (ref 5–15)
BUN: 19 mg/dL (ref 6–20)
CO2: 24 mmol/L (ref 22–32)
Calcium: 8.1 mg/dL — ABNORMAL LOW (ref 8.9–10.3)
Chloride: 98 mmol/L (ref 98–111)
Creatinine, Ser: 0.62 mg/dL (ref 0.44–1.00)
GFR, Estimated: >60 mL/min (ref >60)
Glucose, Bld: 103 mg/dL — ABNORMAL HIGH (ref 70–99)
Potassium: 4.4 mmol/L (ref 3.5–5.1)
Sodium: 131 mmol/L — ABNORMAL LOW (ref 135–145)
Total Bilirubin: 1.1 mg/dL (ref 0.0–1.2)
Total Protein: 6.1 g/dL — ABNORMAL LOW (ref 6.5–8.1)

## 2023-12-12 LAB — MAGNESIUM: Magnesium: 1.8 mg/dL (ref 1.7–2.4)

## 2023-12-12 NOTE — Plan of Care (Signed)
  Problem: Clinical Measurements: Goal: Ability to maintain clinical measurements within normal limits will improve Outcome: Progressing   Problem: Activity: Goal: Risk for activity intolerance will decrease Outcome: Progressing   Problem: Pain Managment: Goal: General experience of comfort will improve and/or be controlled Outcome: Progressing

## 2023-12-12 NOTE — Progress Notes (Signed)
 [facility]                                                                              Joan Wood, is a 53 y.o. female, DOB - [birth year], FMW:[id number] Admit date - 10/03/2023    Outpatient Primary MD for the patient is Bacchus, Meade PEDLAR, FNP  LOS - 70  days  Chief Complaint  Patient presents with   Emesis   Nausea   Diarrhea       Brief summary   53 y.o. female with a history of decompensated liver cirrhosis, PUD, gastric ulcer perforation status post ex lap, GERD, pancreatitis, obstructive sleep apnea. Patient presented secondary to nausea, coffee-ground emesis, bloody diarrhea with concern for upper GI bleed. 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  Hospitalization complicated by hepatic encephalopathy treated with lactulose  and rifaximin . Patient also requiring recurrent paracentesis for her recurrent ascites.    Assessment & Plan    Decompensated alcoholic cirrhosis with recurrent ascites -Underwent therapeutic paracentesis on 11/12/2023 with removal of 750 cc of ascitic fluid.  Ascitic fluid was not consistent with SBP.   - Received IV Rocephin  for UTI.  -  Patient was initially treated with rifaximin  and lactulose  for encephalopathy.  At this time, only on lactulose  has been continued since rifaximin  will not be covered at the outpatient facility.   -11/24/2023: Paracentesis with removal of 3.6 L of fluid - Dr. Sonjia spoke with IR on 11/28/23 about potential peritoneal drain placement since she had it in the past.  Given her current circumstances and need for follow-up and management of drains. it is unlikely that patient will be able to do unless she has assistance at home, or goes to SNF and somebody is there to manage her peritoneal drain.  Patient does have impaired memory and understanding of the  situation.  -Recent MELD of 35 with correlates to roughly 50% 3 month mortality. - 11/28:  paracentesis on 11/28 yielding 4.3L.    Hepatic encephalopathy Improved.   - Continue lactulose  to keep bowel movements 3-4 times a day.     Enterobacter UTI. Treated with Bactrim  DS twice daily and has completed the course   Hypokalemia replace as needed   Hypomagnesemia.   replace as needed   Hypoalbuminemia Secondary to underlying liver disease.  Causing peripheral edema and recurrent ascites.   Thrombocytopenia Secondary to liver disease.   -Stable   Acute kidney injury Improved, likelys ATN from contrast.   Acute appendicitis General surgery was consulted and patient was thought to be high surgical risk so was treated conservatively with antibiotics.  No further abdominal pain.   Chronic hypotension Has received albumin  intermittently.  Currently on midodrine  20mg  3 times daily.    Chronic hyponatremia Patient's baseline appears to be around 125 -128.   Stable    Acute blood loss anemia secondary to acute upper GI bleed from esophagitis and esophageal necrosis Patient was seen by GI during hospitalization - EGD on 10/04/2023 with findings of grade D esophagitis in addition to concern for  acute esophageal necrosis. Non-bleeding gastric ulcer noted GI recommended Carafate  for 2 weeks and Protonix  twice daily with outpatient GI follow-up.  Patient received 4 units of packed RBC during this hospitalization.   L - h/h stable    Lower extremity edema, right ankle tenderness. Peripheral neuropathy Secondary to hypoalbuminemia from liver disease.   Duplex ultrasound of the lower extremity negative for DVT.    x-ray of the right ankle without any bony issues.   - Continue gabapentin , Lasix  - Slightly elevated uric acid level.  Received colchicine  0.6 mg daily for few days.  Currently off colchicine .   Tobacco abuse Counseling done   History of alcohol  abuse No active issues.   Counseling done.  Continue multivitamin and thiamine .  No signs of withdrawal.    Goals of care Plan for skilled nursing facility for rehabitation.  Difficult to place   Insomnia - Continue Seroquel   Fall -See RN documentation. Pt found on ground in feces on the early morning of 11/30 -B hip and B ankle xrays reviewed, no acute fractures    RN Pressure Injury Documentation: Wound 10/15/23 0900 Pressure Injury Sacrum Mid Stage 2 -  Partial thickness loss of dermis presenting as a shallow open injury with a red, pink wound bed without slough. (Active)  Unclear if POA  Severe protein calorie malnutrition Nutrition Problem: Severe Malnutrition Etiology: chronic illness (cirrhosis) Signs/Symptoms: severe fat depletion, severe muscle depletion Interventions: Ensure Enlive (each supplement provides 350kcal and 20 grams of protein), MVI, Liberalize Diet, Magic cup  Estimated body mass index is 24.19 kg/m as calculated from the following:   Height as of this encounter: 5' 2 (1.575 m).   Weight as of this encounter: 60 kg.  Code Status: Full code DVT Prophylaxis:  SCDs Start: 10/03/23 2153   Level of Care: Level of care: Progressive Family Communication:  Disposition Plan:      Remains inpatient appropriate: Awaiting placement   Procedures:  Paracentesis  Consultants:     Antimicrobials:   Anti-infectives (From admission, onward)    Start     Dose/Rate Route Frequency Ordered Stop   12/06/23 1930  fluconazole  (DIFLUCAN ) tablet 150 mg        150 mg Oral  Once 12/06/23 1831 12/06/23 1905   11/21/23 1115  sulfamethoxazole -trimethoprim  (BACTRIM  DS) 800-160 MG per tablet 1 tablet        1 tablet Oral Every 12 hours 11/21/23 1024 11/23/23 2117   11/20/23 1400  cefTRIAXone  (ROCEPHIN ) 1 g in sodium chloride  0.9 % 100 mL IVPB  Status:  Discontinued        1 g 200 mL/hr over 30 Minutes Intravenous Every 24 hours 11/20/23 1245 11/21/23 1024   11/12/23 1145  rifaximin  (XIFAXAN )  tablet 550 mg  Status:  Discontinued        550 mg Oral 2 times daily 11/12/23 1058 11/22/23 1628   10/29/23 0000  rifaximin  (XIFAXAN ) 550 MG TABS tablet        550 mg Oral 2 times daily 10/29/23 1129     10/25/23 1415  rifaximin  (XIFAXAN ) tablet 550 mg  Status:  Discontinued        550 mg Oral 2 times daily 10/25/23 1315 11/10/23 1244   10/12/23 1000  metroNIDAZOLE  (FLAGYL ) tablet 500 mg        500 mg Oral Every 12 hours 10/12/23 0734 10/22/23 2125   10/12/23 0830  cefTRIAXone  (ROCEPHIN ) 2 g in sodium chloride  0.9 % 100 mL IVPB  2 g 200 mL/hr over 30 Minutes Intravenous Every 24 hours 10/12/23 0734 10/22/23 1636   10/07/23 1000  fluconazole  (DIFLUCAN ) tablet 100 mg  Status:  Discontinued        100 mg Oral Daily 10/07/23 0853 10/07/23 0855   10/07/23 1000  fluconazole  (DIFLUCAN ) tablet 150 mg        150 mg Oral Daily 10/07/23 0855 10/07/23 1014   10/04/23 1000  cefTRIAXone  (ROCEPHIN ) 2 g in sodium chloride  0.9 % 100 mL IVPB        2 g 200 mL/hr over 30 Minutes Intravenous Every 24 hours 10/03/23 2154 10/08/23 0854   10/03/23 2015  cefTRIAXone  (ROCEPHIN ) 1 g in sodium chloride  0.9 % 100 mL IVPB        1 g 200 mL/hr over 30 Minutes Intravenous  Once 10/03/23 2010 10/03/23 2212          Medications  sodium chloride    Intravenous Once   calcium  carbonate  1 tablet Oral TID WC   feeding supplement  237 mL Oral TID BM   folic acid   1 mg Oral Daily   furosemide   40 mg Oral BID   gabapentin   600 mg Oral TID   lactulose   20 g Oral TID   lidocaine   2 patch Transdermal Q24H   lidocaine  (PF)  10 mL Other Once   midodrine   20 mg Oral TID WC   multivitamin with minerals  1 tablet Oral Daily   nutrition supplement (JUVEN)  1 packet Oral BID BM   pantoprazole   40 mg Oral BID   QUEtiapine  25 mg Oral QHS   thiamine   100 mg Oral Daily   triamcinolone  0.1 % cream : eucerin   Topical Daily      Subjective:   Joan Wood was seen and examined today.  Sitting up, no acute issues,  awaiting placement.  Abdomen still soft   Objective:   Vitals:   12/12/23 0029 12/12/23 0315 12/12/23 0916 12/12/23 1241  BP: 108/62 (!) 98/56 (!) 100/59 102/62  Pulse: 79 90 87 80  Resp: 18 16 18 17   Temp: 98.4 F (36.9 C) 98.5 F (36.9 C) 98.1 F (36.7 C) 98.5 F (36.9 C)  TempSrc: Oral Oral Oral Oral  SpO2:   100% 99%  Weight:      Height:        Intake/Output Summary (Last 24 hours) at 12/12/2023 1417 Last data filed at 12/12/2023 1111 Gross per 24 hour  Intake 360 ml  Output --  Net 360 ml     Wt Readings from Last 3 Encounters:  12/07/23 60 kg  09/17/23 51.7 kg  09/17/23 51.7 kg     Exam General: Alert and oriented x 3, NAD Cardiovascular: S1 S2 auscultated,  RRR Respiratory: Clear to auscultation bilaterally, no wheezing Gastrointestinal: Soft, nontender, nondistended, + bowel sounds Ext: no pedal edema bilaterally Neuro: No new deficits Psych: Normal affect     Data Reviewed:  I have personally reviewed following labs    CBC Lab Results  Component Value Date   WBC 4.2 12/12/2023   RBC 2.52 (L) 12/12/2023   HGB 8.2 (L) 12/12/2023   HCT 23.8 (L) 12/12/2023   MCV 94.4 12/12/2023   MCH 32.5 12/12/2023   PLT 141 (L) 12/12/2023   MCHC 34.5 12/12/2023   RDW 15.7 (H) 12/12/2023   LYMPHSABS 1.3 10/31/2023   MONOABS 1.3 (H) 10/31/2023   EOSABS 0.1 10/31/2023   BASOSABS 0.0 10/31/2023  Last metabolic panel Lab Results  Component Value Date   NA 131 (L) 12/12/2023   K 4.4 12/12/2023   CL 98 12/12/2023   CO2 24 12/12/2023   BUN 19 12/12/2023   CREATININE 0.62 12/12/2023   GLUCOSE 103 (H) 12/12/2023   GFRNONAA >60 12/12/2023   CALCIUM  8.1 (L) 12/12/2023   PHOS 3.9 11/30/2023   PROT 6.1 (L) 12/12/2023   ALBUMIN  1.8 (L) 12/12/2023   LABGLOB 4.4 07/30/2023   AGRATIO 1.2 03/28/2022   BILITOT 1.1 12/12/2023   ALKPHOS 68 12/12/2023   AST 58 (H) 12/12/2023   ALT 26 12/12/2023   ANIONGAP 9 12/12/2023    CBG (last 3)  No results for  input(s): GLUCAP in the last 72 hours.    Coagulation Profile: No results for input(s): INR, PROTIME in the last 168 hours.   Radiology Studies: I have personally reviewed the imaging studies  No results found.     Nydia Distance M.D. [facility] 12/12/2023, 2:17 PM  Available via Epic secure chat 7am-7pm After 7 pm, please refer to night coverage provider listed on amion.

## 2023-12-12 NOTE — TOC Progression Note (Signed)
 Transition of Care [facility]) - Progression Note    Patient Details  Name: Joan Wood MRN: [id number] Date of Birth: 22-Mar-1970  [facility] CM/SW Contact  [name], [name] Phone Number: 12/12/2023, 11:57 AM  Clinical Narrative:   CSW updated by [facility] liaison that they are not able to offer a bed for the patient at this time. Patient with no other bed offers. CSW faxed out referral locally for any bed offers, will also continue to reach out to facilities in the [place] area.    Expected Discharge Plan: Skilled Nursing Facility Barriers to Discharge: Homeless with medical needs, Inadequate or no insurance, Continued Medical Work up, Unsafe home situation               Expected Discharge Plan and Services In-house Referral: Clinical Social Work Discharge Planning Services: CM Consult Post Acute Care Choice: Home Health Living arrangements for the past 2 months: Single Family Home                 DME Arranged: Walker rolling   Date DME Agency Contacted: 10/08/23   Representative spoke with at DME Agency: [name]             Social Drivers of Health (SDOH) Interventions SDOH Screenings   Food Insecurity: No Food Insecurity (10/03/2023)  Housing: Low Risk  (10/03/2023)  Transportation Needs: No Transportation Needs (10/03/2023)  Recent Concern: Transportation Needs - Unmet Transportation Needs (08/20/2023)  Utilities: Not At Risk (10/03/2023)  Depression (PHQ2-9): High Risk (07/30/2023)  Social Connections: Socially Isolated (10/03/2023)  Tobacco Use: High Risk (10/04/2023)    Readmission Risk Interventions    10/04/2023    9:02 AM 09/03/2023   12:31 PM 09/02/2023    9:01 AM  Readmission Risk Prevention Plan  Transportation Screening Complete Complete Complete  PCP or Specialist Appt within 3-5 Days   Complete  HRI or Home Care Consult   Complete  Social Work Consult for Recovery Care Planning/Counseling   Complete  Palliative Care  Screening   Not Applicable  Medication Review Oceanographer) Complete Complete Complete  HRI or Home Care Consult Complete Complete   SW Recovery Care/Counseling Consult Complete Complete   Palliative Care Screening Not Applicable Not Applicable   Skilled Nursing Facility Not Applicable Patient Refused

## 2023-12-12 NOTE — TOC Progression Note (Signed)
 Transition of Care [facility]) - Progression Note    Patient Details  Name: Joan Wood MRN: [id number] Date of Birth: 1970/07/31  [facility]) CM/SW Contact  [name], [name] Phone Number: 12/12/2023, 10:47 AM  Clinical Narrative:   CSW following for disposition. Patient continues with no bed offers close to family. CSW contacted [name] liaison to see if they were still interested, and provided update on patient's medical needs regarding paracentesis on an outpatient basis. [facility] reviewing, will update CSW.     Expected Discharge Plan: Skilled Nursing Facility Barriers to Discharge: Homeless with medical needs, Inadequate or no insurance, Continued Medical Work up, Unsafe home situation               Expected Discharge Plan and Services In-house Referral: Clinical Social Work Discharge Planning Services: CM Consult Post Acute Care Choice: Home Health Living arrangements for the past 2 months: Single Family Home                 DME Arranged: Walker rolling   Date DME Agency Contacted: 10/08/23   Representative spoke with at DME Agency: [name]             Social Drivers of Health (SDOH) Interventions SDOH Screenings   Food Insecurity: No Food Insecurity (10/03/2023)  Housing: Low Risk  (10/03/2023)  Transportation Needs: No Transportation Needs (10/03/2023)  Recent Concern: Transportation Needs - Unmet Transportation Needs (08/20/2023)  Utilities: Not At Risk (10/03/2023)  Depression (PHQ2-9): High Risk (07/30/2023)  Social Connections: Socially Isolated (10/03/2023)  Tobacco Use: High Risk (10/04/2023)    Readmission Risk Interventions    10/04/2023    9:02 AM 09/03/2023   12:31 PM 09/02/2023    9:01 AM  Readmission Risk Prevention Plan  Transportation Screening Complete Complete Complete  PCP or Specialist Appt within 3-5 Days   Complete  HRI or Home Care Consult   Complete  Social Work Consult for Recovery Care  Planning/Counseling   Complete  Palliative Care Screening   Not Applicable  Medication Review Oceanographer) Complete Complete Complete  HRI or Home Care Consult Complete Complete   SW Recovery Care/Counseling Consult Complete Complete   Palliative Care Screening Not Applicable Not Applicable   Skilled Nursing Facility Not Applicable Patient Refused

## 2023-12-13 ENCOUNTER — Telehealth: Payer: Self-pay

## 2023-12-13 DIAGNOSIS — E871 Hypo-osmolality and hyponatremia: Secondary | ICD-10-CM | POA: Diagnosis not present

## 2023-12-13 DIAGNOSIS — K7290 Hepatic failure, unspecified without coma: Secondary | ICD-10-CM | POA: Diagnosis not present

## 2023-12-13 DIAGNOSIS — K7460 Unspecified cirrhosis of liver: Secondary | ICD-10-CM | POA: Diagnosis not present

## 2023-12-13 DIAGNOSIS — R197 Diarrhea, unspecified: Secondary | ICD-10-CM | POA: Diagnosis not present

## 2023-12-13 DIAGNOSIS — R112 Nausea with vomiting, unspecified: Secondary | ICD-10-CM | POA: Diagnosis not present

## 2023-12-13 DIAGNOSIS — K922 Gastrointestinal hemorrhage, unspecified: Secondary | ICD-10-CM | POA: Diagnosis not present

## 2023-12-13 DIAGNOSIS — D62 Acute posthemorrhagic anemia: Secondary | ICD-10-CM | POA: Diagnosis not present

## 2023-12-13 MED ORDER — PREDNISONE 20 MG PO TABS
40.0000 mg | ORAL_TABLET | Freq: Every day | ORAL | Status: AC
  Administered 2023-12-13 – 2023-12-15 (×3): 40 mg via ORAL
  Filled 2023-12-13 (×3): qty 2

## 2023-12-13 NOTE — Progress Notes (Signed)
 [facility]                                                                              Joan Wood, is a 53 y.o. female, DOB - 1970/03/22, FMW:[id number] Admit date - 10/03/2023    Outpatient Primary MD for the patient is Bacchus, Meade PEDLAR, FNP  LOS - 71  days  Chief Complaint  Patient presents with   Emesis   Nausea   Diarrhea       Brief summary   53 y.o. female with a history of decompensated liver cirrhosis, PUD, gastric ulcer perforation status post ex lap, GERD, pancreatitis, obstructive sleep apnea. Patient presented secondary to nausea, coffee-ground emesis, bloody diarrhea with concern for upper GI bleed. 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  Hospitalization complicated by hepatic encephalopathy treated with lactulose  and rifaximin . Patient also requiring recurrent paracentesis for her recurrent ascites.   Awaiting placement  Assessment & Plan    Decompensated alcoholic cirrhosis with recurrent ascites -Underwent therapeutic paracentesis on 11/12/2023 with removal of 750 cc of ascitic fluid.  Ascitic fluid was not consistent with SBP.   - Received IV Rocephin  for UTI.  -  Patient was initially treated with rifaximin  and lactulose  for encephalopathy.  At this time, only on lactulose  has been continued since rifaximin  will not be covered at the outpatient facility.   -11/24/2023: Paracentesis with removal of 3.6 L of fluid - Dr. Sonjia spoke with IR on 11/28/23 about potential peritoneal drain placement since she had it in the past.  Given her current circumstances and need for follow-up and management of drains. it is unlikely that patient will be able to do unless she has assistance at home, or goes to SNF and somebody is there to manage her peritoneal drain.  Patient does have impaired memory and  understanding of the situation.  -Recent MELD of 35 with correlates to roughly 50% 3 month mortality. - 11/28:  paracentesis on 11/28 yielding 4.3L.    Hepatic encephalopathy -Improved.   - Continue lactulose  to keep bowel movements 3-4 times a day.     Enterobacter UTI. Treated with Bactrim  DS twice daily and has completed the course   Hypokalemia, hypomagnesemia replace as needed   Hypoalbuminemia Secondary to underlying liver disease.  Causing peripheral edema and recurrent ascites.   Thrombocytopenia Secondary to liver disease.   -Stable   Acute kidney injury Improved, likely  ATN from contrast.   Acute appendicitis General surgery was consulted and patient was thought to be high surgical risk so was treated conservatively with antibiotics.  No further abdominal pain.   Chronic hypotension Has received albumin  intermittently.  Currently on midodrine  20mg  3 times daily.    Chronic hyponatremia Patient's baseline appears to be around 125 -128.   Stable    Acute blood loss anemia secondary to acute upper GI bleed from esophagitis and esophageal necrosis Patient was seen by GI during hospitalization - EGD on 10/04/2023 with findings of grade D esophagitis in addition to concern for acute esophageal necrosis. Non-bleeding  gastric ulcer noted GI recommended Carafate  for 2 weeks and Protonix  twice daily with outpatient GI follow-up.  Patient received 4 units of packed RBC during this hospitalization.   L - H&H is stable    Lower extremity edema, right ankle tenderness. Peripheral neuropathy. ?  Gout -Edema likely secondary to hypoalbuminemia from liver disease.   - Duplex ultrasound of the lower extremity negative for DVT.    -x-ray of the right ankle 11/30 without any bony issues.  x-ray hip showed no evidence of hip fracture or dislocation - Continue gabapentin , Lasix  - Slightly elevated uric acid level 7.4 on 11/13.  Received colchicine  0.6 mg daily, currently off  colchicine . -Complaining of pain in the ankles, hip, elbow, will try prednisone  40 mg for 3 days, reassess improvement.   Tobacco abuse Counseling done   History of alcohol  abuse No active issues.  Continue multivitamin and thiamine .  No signs of withdrawal.    Goals of care Plan for skilled nursing facility for rehabitation.  Difficult to place   Insomnia - Continue Seroquel   Fall -See RN documentation. Pt found on ground in feces on the early morning of 11/30 -B hip and B ankle xrays reviewed, no acute fractures    RN Pressure Injury Documentation: Wound 10/15/23 0900 Pressure Injury Sacrum Mid Stage 2 -  Partial thickness loss of dermis presenting as a shallow open injury with a red, pink wound bed without slough. (Active)  Unclear if POA  Severe protein calorie malnutrition Nutrition Problem: Severe Malnutrition Etiology: chronic illness (cirrhosis) Signs/Symptoms: severe fat depletion, severe muscle depletion Interventions: Ensure Enlive (each supplement provides 350kcal and 20 grams of protein), MVI, Liberalize Diet, Magic cup  Estimated body mass index is 24.19 kg/m as calculated from the following:   Height as of this encounter: 5' 2 (1.575 m).   Weight as of this encounter: 60 kg.  Code Status: Full code DVT Prophylaxis:  SCDs Start: 10/03/23 2153   Level of Care: Level of care: Progressive Family Communication:  Disposition Plan:      Remains inpatient appropriate: Awaiting placement   Procedures:  Paracentesis  Consultants:     Antimicrobials:   Anti-infectives (From admission, onward)    Start     Dose/Rate Route Frequency Ordered Stop   12/06/23 1930  fluconazole  (DIFLUCAN ) tablet 150 mg        150 mg Oral  Once 12/06/23 1831 12/06/23 1905   11/21/23 1115  sulfamethoxazole -trimethoprim  (BACTRIM  DS) 800-160 MG per tablet 1 tablet        1 tablet Oral Every 12 hours 11/21/23 1024 11/23/23 2117   11/20/23 1400  cefTRIAXone  (ROCEPHIN ) 1 g in  sodium chloride  0.9 % 100 mL IVPB  Status:  Discontinued        1 g 200 mL/hr over 30 Minutes Intravenous Every 24 hours 11/20/23 1245 11/21/23 1024   11/12/23 1145  rifaximin  (XIFAXAN ) tablet 550 mg  Status:  Discontinued        550 mg Oral 2 times daily 11/12/23 1058 11/22/23 1628   10/29/23 0000  rifaximin  (XIFAXAN ) 550 MG TABS tablet        550 mg Oral 2 times daily 10/29/23 1129     10/25/23 1415  rifaximin  (XIFAXAN ) tablet 550 mg  Status:  Discontinued        550 mg Oral 2 times daily 10/25/23 1315 11/10/23 1244   10/12/23 1000  metroNIDAZOLE  (FLAGYL ) tablet 500 mg        500 mg Oral  Every 12 hours 10/12/23 0734 10/22/23 2125   10/12/23 0830  cefTRIAXone  (ROCEPHIN ) 2 g in sodium chloride  0.9 % 100 mL IVPB        2 g 200 mL/hr over 30 Minutes Intravenous Every 24 hours 10/12/23 0734 10/22/23 1636   10/07/23 1000  fluconazole  (DIFLUCAN ) tablet 100 mg  Status:  Discontinued        100 mg Oral Daily 10/07/23 0853 10/07/23 0855   10/07/23 1000  fluconazole  (DIFLUCAN ) tablet 150 mg        150 mg Oral Daily 10/07/23 0855 10/07/23 1014   10/04/23 1000  cefTRIAXone  (ROCEPHIN ) 2 g in sodium chloride  0.9 % 100 mL IVPB        2 g 200 mL/hr over 30 Minutes Intravenous Every 24 hours 10/03/23 2154 10/08/23 0854   10/03/23 2015  cefTRIAXone  (ROCEPHIN ) 1 g in sodium chloride  0.9 % 100 mL IVPB        1 g 200 mL/hr over 30 Minutes Intravenous  Once 10/03/23 2010 10/03/23 2212          Medications  sodium chloride    Intravenous Once   calcium  carbonate  1 tablet Oral TID WC   feeding supplement  237 mL Oral TID BM   folic acid   1 mg Oral Daily   furosemide   40 mg Oral BID   gabapentin   600 mg Oral TID   lactulose   20 g Oral TID   lidocaine   2 patch Transdermal Q24H   lidocaine  (PF)  10 mL Other Once   midodrine   20 mg Oral TID WC   multivitamin with minerals  1 tablet Oral Daily   nutrition supplement (JUVEN)  1 packet Oral BID BM   pantoprazole   40 mg Oral BID   QUEtiapine  25 mg  Oral QHS   thiamine   100 mg Oral Daily   triamcinolone  0.1 % cream : eucerin   Topical Daily      Subjective:   Joan Wood was seen and examined today.  Complaining of pain in ankles, hip, elbow.  States did not sleep well last night.    Objective:   Vitals:   12/13/23 0011 12/13/23 0417 12/13/23 0833 12/13/23 1126  BP: 110/70 109/69 118/72 105/61  Pulse: 85 78 87 83  Resp: 18 18 18 20   Temp: 98.4 F (36.9 C) 98.7 F (37.1 C) 97.6 F (36.4 C) 98.4 F (36.9 C)  TempSrc: Oral Oral Oral Oral  SpO2: 98% 97% 100% 100%  Weight:      Height:        Intake/Output Summary (Last 24 hours) at 12/13/2023 1250 Last data filed at 12/13/2023 0800 Gross per 24 hour  Intake 720 ml  Output --  Net 720 ml     Wt Readings from Last 3 Encounters:  12/07/23 60 kg  09/17/23 51.7 kg  09/17/23 51.7 kg   Physical Exam General: Alert and oriented x 3, NAD Cardiovascular: S1 S2 clear, RRR.  Respiratory: CTAB Gastrointestinal: Soft, nontender, nondistended, NBS Ext: no pedal edema bilaterally Neuro: no new deficits psych: Normal affect     Data Reviewed:  I have personally reviewed following labs    CBC Lab Results  Component Value Date   WBC 4.2 12/12/2023   RBC 2.52 (L) 12/12/2023   HGB 8.2 (L) 12/12/2023   HCT 23.8 (L) 12/12/2023   MCV 94.4 12/12/2023   MCH 32.5 12/12/2023   PLT 141 (L) 12/12/2023   MCHC 34.5 12/12/2023   RDW 15.7 (  H) 12/12/2023   LYMPHSABS 1.3 10/31/2023   MONOABS 1.3 (H) 10/31/2023   EOSABS 0.1 10/31/2023   BASOSABS 0.0 10/31/2023     Last metabolic panel Lab Results  Component Value Date   NA 131 (L) 12/12/2023   K 4.4 12/12/2023   CL 98 12/12/2023   CO2 24 12/12/2023   BUN 19 12/12/2023   CREATININE 0.62 12/12/2023   GLUCOSE 103 (H) 12/12/2023   GFRNONAA >60 12/12/2023   CALCIUM  8.1 (L) 12/12/2023   PHOS 3.9 11/30/2023   PROT 6.1 (L) 12/12/2023   ALBUMIN  1.8 (L) 12/12/2023   LABGLOB 4.4 07/30/2023   AGRATIO 1.2 03/28/2022    BILITOT 1.1 12/12/2023   ALKPHOS 68 12/12/2023   AST 58 (H) 12/12/2023   ALT 26 12/12/2023   ANIONGAP 9 12/12/2023    CBG (last 3)  No results for input(s): GLUCAP in the last 72 hours.    Coagulation Profile: No results for input(s): INR, PROTIME in the last 168 hours.   Radiology Studies: I have personally reviewed the imaging studies  No results found.     Nydia Distance M.D. [facility] 12/13/2023, 12:50 PM  Available via Epic secure chat 7am-7pm After 7 pm, please refer to night coverage provider listed on amion.

## 2023-12-13 NOTE — Patient Outreach (Signed)
 Complex Care Management Care Guide Note  12/13/2023 Name: Joan Wood MRN: [id number] DOB: January 04, 1971  Joan Wood is a 53 y.o. year old female who is a primary care patient of Bacchus, Gloria Z, FNP and is actively engaged with the care management team. I reached out to [name] by phone today to assist with re-scheduling  with the RN Case Manager.  Follow up plan: 3rd Unsuccessful telephone outreach attempt made. A HIPAA compliant phone message was left for the patient providing contact information and requesting a return call. No further outreach attempts will be made at this time.   Shereen Gin [facility]  Population Health VBCI Assistant Direct Dial: [phone]  Fax: [phone] Website: [email or web address]

## 2023-12-14 DIAGNOSIS — D62 Acute posthemorrhagic anemia: Secondary | ICD-10-CM | POA: Diagnosis not present

## 2023-12-14 DIAGNOSIS — K7290 Hepatic failure, unspecified without coma: Secondary | ICD-10-CM | POA: Diagnosis not present

## 2023-12-14 DIAGNOSIS — R112 Nausea with vomiting, unspecified: Secondary | ICD-10-CM | POA: Diagnosis not present

## 2023-12-14 DIAGNOSIS — K7460 Unspecified cirrhosis of liver: Secondary | ICD-10-CM | POA: Diagnosis not present

## 2023-12-14 DIAGNOSIS — R197 Diarrhea, unspecified: Secondary | ICD-10-CM | POA: Diagnosis not present

## 2023-12-14 DIAGNOSIS — K922 Gastrointestinal hemorrhage, unspecified: Secondary | ICD-10-CM | POA: Diagnosis not present

## 2023-12-14 DIAGNOSIS — E871 Hypo-osmolality and hyponatremia: Secondary | ICD-10-CM | POA: Diagnosis not present

## 2023-12-14 NOTE — TOC Progression Note (Signed)
 Transition of Care [facility]) - Progression Note    Patient Details  Name: Joan Wood MRN: [id number] Date of Birth: 1970-11-08  Transition of Care [facility] CM/SW Contact  [name], [name] Phone Number: 12/14/2023, 2:28 PM  Clinical Narrative:   Patient continues with no bed offers. CSW noting that [facility] buildings were considering. CSW reached out to [facility] to discuss any possibility of options near [place], and they have a building in [place]. Referral was sent. They are concerned that patient would need LTC and doesn't have disability, they cannot offer a bed for the patient at this time. No other bed offers. CSW to follow.    Expected Discharge Plan: Skilled Nursing Facility Barriers to Discharge: Homeless with medical needs, Inadequate or no insurance, Continued Medical Work up, Unsafe home situation               Expected Discharge Plan and Services In-house Referral: Clinical Social Work Discharge Planning Services: CM Consult Post Acute Care Choice: Home Health Living arrangements for the past 2 months: Single Family Home                 DME Arranged: [place] rolling   Date DME Agency Contacted: 10/08/23   Representative spoke with at DME Agency: [name]             Social Drivers of Health (SDOH) Interventions SDOH Screenings   Food Insecurity: No Food Insecurity (10/03/2023)  Housing: Low Risk  (10/03/2023)  Transportation Needs: No Transportation Needs (10/03/2023)  Recent Concern: Transportation Needs - Unmet Transportation Needs (08/20/2023)  Utilities: Not At Risk (10/03/2023)  Depression (PHQ2-9): High Risk (07/30/2023)  Social Connections: Socially Isolated (10/03/2023)  Tobacco Use: High Risk (10/04/2023)    Readmission Risk Interventions    10/04/2023    9:02 AM 09/03/2023   12:31 PM 09/02/2023    9:01 AM  Readmission Risk Prevention Plan  Transportation Screening Complete Complete Complete  PCP or Specialist Appt within 3-5  Days   Complete  HRI or Home Care Consult   Complete  Social Work Consult for Recovery Care Planning/Counseling   Complete  Palliative Care Screening   Not Applicable  Medication Review Oceanographer) Complete Complete Complete  HRI or Home Care Consult Complete Complete   SW Recovery Care/Counseling Consult Complete Complete   Palliative Care Screening Not Applicable Not Applicable   Skilled Nursing Facility Not Applicable Patient Refused

## 2023-12-14 NOTE — Progress Notes (Signed)
 [facility]                                                                              Joan Wood, is a 53 y.o. female, DOB - 05-26-1970, FMW:[id number] Admit date - 10/03/2023    Outpatient Primary MD for the patient is Bacchus, Meade PEDLAR, FNP  LOS - 72  days  Chief Complaint  Patient presents with   Emesis   Nausea   Diarrhea       Brief summary   53 y.o. female with a history of decompensated liver cirrhosis, PUD, gastric ulcer perforation status post ex lap, GERD, pancreatitis, obstructive sleep apnea. Patient presented secondary to nausea, coffee-ground emesis, bloody diarrhea with concern for upper GI bleed. 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  Hospitalization complicated by hepatic encephalopathy treated with lactulose  and rifaximin . Patient also requiring recurrent paracentesis for her recurrent ascites.   Awaiting placement  Assessment & Plan    Decompensated alcoholic cirrhosis with recurrent ascites -Underwent therapeutic paracentesis on 11/12/2023 with removal of 750 cc of ascitic fluid.  Ascitic fluid was not consistent with SBP.   - Received IV Rocephin  for UTI.  -  Patient was initially treated with rifaximin  and lactulose  for encephalopathy.  At this time, only on lactulose  has been continued since rifaximin  will not be covered at the outpatient facility.   -11/24/2023: Paracentesis with removal of 3.6 L of fluid - Dr. Sonjia spoke with IR on 11/28/23 about potential peritoneal drain placement since she had it in the past.  Given her current circumstances and need for follow-up and management of drains. it is unlikely that patient will be able to do unless she has assistance at home, or goes to SNF and somebody is there to manage her peritoneal drain.  Patient does have impaired memory and  understanding of the situation.  -Recent MELD of 35 with correlates to roughly 50% 3 month mortality. - 11/28:  paracentesis on 11/28 yielding 4.3L - Currently abdomen soft however will likely need repeat paracentesis next week,  reassess on Monday   Hepatic encephalopathy -Improved.   - Continue lactulose  to keep bowel movements 3-4 times a day.     Enterobacter UTI. Treated with Bactrim  DS twice daily and has completed the course   Hypokalemia, hypomagnesemia replace as needed   Hypoalbuminemia Secondary to underlying liver disease.  Causing peripheral edema and recurrent ascites.   Thrombocytopenia Secondary to liver disease.   -Stable   Acute kidney injury Improved, likely  ATN from contrast.   Acute appendicitis General surgery was consulted and patient was thought to be high surgical risk so was treated conservatively with antibiotics.  No further abdominal pain.   Chronic hypotension Has received albumin  intermittently.  Currently on midodrine  20mg  3 times daily.    Chronic hyponatremia Patient's baseline appears to be around 125 -128.   Stable    Acute blood loss anemia secondary to acute upper GI bleed from esophagitis and esophageal necrosis Patient was seen by GI during hospitalization - EGD on 10/04/2023  with findings of grade D esophagitis in addition to concern for acute esophageal necrosis. Non-bleeding gastric ulcer noted GI recommended Carafate  for 2 weeks and Protonix  twice daily with outpatient GI follow-up.  Patient received 4 units of packed RBC during this hospitalization.   L - H&H stable    Lower extremity edema, right ankle tenderness. Peripheral neuropathy. ?  Gout -Edema likely secondary to hypoalbuminemia from liver disease.   - Duplex ultrasound of the lower extremity negative for DVT.    -x-ray of the right ankle 11/30 without any bony issues.  x-ray hip showed no evidence of hip fracture or dislocation - Continue gabapentin , Lasix  - Slightly  elevated uric acid level 7.4 on 11/13.  Received colchicine  0.6 mg daily, currently off colchicine . - Per patient, pain in the hip and ankles better with prednisone  - Will continue prednisone  and taper by 10 mg every 3 days.     Tobacco abuse Counseling done   History of alcohol  abuse No active issues.  Continue multivitamin and thiamine .  No signs of withdrawal.    Goals of care Plan for skilled nursing facility for rehabitation.  Difficult to place   Insomnia - Continue Seroquel    Fall -See RN documentation. Pt found on ground in feces on the early morning of 11/30 -B hip and B ankle xrays reviewed, no acute fractures    RN Pressure Injury Documentation: Wound 10/15/23 0900 Pressure Injury Sacrum Mid Stage 2 -  Partial thickness loss of dermis presenting as a shallow open injury with a red, pink wound bed without slough. (Active)  Unclear if POA  Severe protein calorie malnutrition Nutrition Problem: Severe Malnutrition Etiology: chronic illness (cirrhosis) Signs/Symptoms: severe fat depletion, severe muscle depletion Interventions: Ensure Enlive (each supplement provides 350kcal and 20 grams of protein), MVI, Liberalize Diet, Magic cup  Estimated body mass index is 23.7 kg/m as calculated from the following:   Height as of this encounter: 5' 2 (1.575 m).   Weight as of this encounter: 58.8 kg.  Code Status: Full code DVT Prophylaxis:  SCDs Start: 10/03/23 2153   Level of Care: Level of care: Progressive Family Communication:  Disposition Plan:      Remains inpatient appropriate: Awaiting placement   Procedures:  Paracentesis  Consultants:     Antimicrobials:   Anti-infectives (From admission, onward)    Start     Dose/Rate Route Frequency Ordered Stop   12/06/23 1930  fluconazole  (DIFLUCAN ) tablet 150 mg        150 mg Oral  Once 12/06/23 1831 12/06/23 1905   11/21/23 1115  sulfamethoxazole -trimethoprim  (BACTRIM  DS) 800-160 MG per tablet 1 tablet        1  tablet Oral Every 12 hours 11/21/23 1024 11/23/23 2117   11/20/23 1400  cefTRIAXone  (ROCEPHIN ) 1 g in sodium chloride  0.9 % 100 mL IVPB  Status:  Discontinued        1 g 200 mL/hr over 30 Minutes Intravenous Every 24 hours 11/20/23 1245 11/21/23 1024   11/12/23 1145  rifaximin  (XIFAXAN ) tablet 550 mg  Status:  Discontinued        550 mg Oral 2 times daily 11/12/23 1058 11/22/23 1628   10/29/23 0000  rifaximin  (XIFAXAN ) 550 MG TABS tablet        550 mg Oral 2 times daily 10/29/23 1129     10/25/23 1415  rifaximin  (XIFAXAN ) tablet 550 mg  Status:  Discontinued        550 mg Oral 2 times daily 10/25/23 1315  11/10/23 1244   10/12/23 1000  metroNIDAZOLE  (FLAGYL ) tablet 500 mg        500 mg Oral Every 12 hours 10/12/23 0734 10/22/23 2125   10/12/23 0830  cefTRIAXone  (ROCEPHIN ) 2 g in sodium chloride  0.9 % 100 mL IVPB        2 g 200 mL/hr over 30 Minutes Intravenous Every 24 hours 10/12/23 0734 10/22/23 1636   10/07/23 1000  fluconazole  (DIFLUCAN ) tablet 100 mg  Status:  Discontinued        100 mg Oral Daily 10/07/23 0853 10/07/23 0855   10/07/23 1000  fluconazole  (DIFLUCAN ) tablet 150 mg        150 mg Oral Daily 10/07/23 0855 10/07/23 1014   10/04/23 1000  cefTRIAXone  (ROCEPHIN ) 2 g in sodium chloride  0.9 % 100 mL IVPB        2 g 200 mL/hr over 30 Minutes Intravenous Every 24 hours 10/03/23 2154 10/08/23 0854   10/03/23 2015  cefTRIAXone  (ROCEPHIN ) 1 g in sodium chloride  0.9 % 100 mL IVPB        1 g 200 mL/hr over 30 Minutes Intravenous  Once 10/03/23 2010 10/03/23 2212          Medications  sodium chloride    Intravenous Once   calcium  carbonate  1 tablet Oral TID WC   feeding supplement  237 mL Oral TID BM   folic acid   1 mg Oral Daily   furosemide   40 mg Oral BID   gabapentin   600 mg Oral TID   lactulose   20 g Oral TID   lidocaine   2 patch Transdermal Q24H   lidocaine  (PF)  10 mL Other Once   midodrine   20 mg Oral TID WC   multivitamin with minerals  1 tablet Oral Daily    nutrition supplement (JUVEN)  1 packet Oral BID BM   pantoprazole   40 mg Oral BID   predniSONE   40 mg Oral QAC breakfast   QUEtiapine   25 mg Oral QHS   thiamine   100 mg Oral Daily   triamcinolone  0.1 % cream : eucerin   Topical Daily      Subjective:   Joan Wood was seen and examined today.  States pain in the ankles, right hip better today.  Abdomen still soft, no acute nausea or vomiting chest pain or shortness of breath.  No fevers.    Objective:   Vitals:   12/13/23 1700 12/13/23 2034 12/14/23 0500 12/14/23 0808  BP:  114/71  123/82  Pulse:  88  94  Resp:    16  Temp:  97.6 F (36.4 C)  (!) 97.5 F (36.4 C)  TempSrc:  Oral  Oral  SpO2:  100%  100%  Weight: 58.6 kg  58.8 kg   Height:        Intake/Output Summary (Last 24 hours) at 12/14/2023 1154 Last data filed at 12/14/2023 0700 Gross per 24 hour  Intake 747 ml  Output --  Net 747 ml     Wt Readings from Last 3 Encounters:  12/14/23 58.8 kg  09/17/23 51.7 kg  09/17/23 51.7 kg    Physical Exam General: Alert and oriented x 3, NAD Cardiovascular: S1 S2 clear, RRR.  Respiratory: CTAB, no wheezing Gastrointestinal: Soft, nontender, distended, NBS Ext: no pedal edema bilaterally Neuro: no new deficits Psych: Normal affect     Data Reviewed:  I have personally reviewed following labs    CBC Lab Results  Component Value Date   WBC 4.2 12/12/2023  RBC 2.52 (L) 12/12/2023   HGB 8.2 (L) 12/12/2023   HCT 23.8 (L) 12/12/2023   MCV 94.4 12/12/2023   MCH 32.5 12/12/2023   PLT 141 (L) 12/12/2023   MCHC 34.5 12/12/2023   RDW 15.7 (H) 12/12/2023   LYMPHSABS 1.3 10/31/2023   MONOABS 1.3 (H) 10/31/2023   EOSABS 0.1 10/31/2023   BASOSABS 0.0 10/31/2023     Last metabolic panel Lab Results  Component Value Date   NA 131 (L) 12/12/2023   K 4.4 12/12/2023   CL 98 12/12/2023   CO2 24 12/12/2023   BUN 19 12/12/2023   CREATININE 0.62 12/12/2023   GLUCOSE 103 (H) 12/12/2023   GFRNONAA >60 12/12/2023    CALCIUM  8.1 (L) 12/12/2023   PHOS 3.9 11/30/2023   PROT 6.1 (L) 12/12/2023   ALBUMIN  1.8 (L) 12/12/2023   LABGLOB 4.4 07/30/2023   AGRATIO 1.2 03/28/2022   BILITOT 1.1 12/12/2023   ALKPHOS 68 12/12/2023   AST 58 (H) 12/12/2023   ALT 26 12/12/2023   ANIONGAP 9 12/12/2023    CBG (last 3)  No results for input(s): GLUCAP in the last 72 hours.    Coagulation Profile: No results for input(s): INR, PROTIME in the last 168 hours.   Radiology Studies: I have personally reviewed the imaging studies  No results found.     Nydia Distance M.D. [facility] 12/14/2023, 11:54 AM  Available via Epic secure chat 7am-7pm After 7 pm, please refer to night coverage provider listed on amion.

## 2023-12-15 ENCOUNTER — Inpatient Hospital Stay (HOSPITAL_COMMUNITY)

## 2023-12-15 DIAGNOSIS — R112 Nausea with vomiting, unspecified: Secondary | ICD-10-CM | POA: Diagnosis not present

## 2023-12-15 DIAGNOSIS — K7460 Unspecified cirrhosis of liver: Secondary | ICD-10-CM | POA: Diagnosis not present

## 2023-12-15 DIAGNOSIS — K7290 Hepatic failure, unspecified without coma: Secondary | ICD-10-CM | POA: Diagnosis not present

## 2023-12-15 DIAGNOSIS — K922 Gastrointestinal hemorrhage, unspecified: Secondary | ICD-10-CM | POA: Diagnosis not present

## 2023-12-15 DIAGNOSIS — E871 Hypo-osmolality and hyponatremia: Secondary | ICD-10-CM | POA: Diagnosis not present

## 2023-12-15 DIAGNOSIS — D62 Acute posthemorrhagic anemia: Secondary | ICD-10-CM | POA: Diagnosis not present

## 2023-12-15 DIAGNOSIS — R197 Diarrhea, unspecified: Secondary | ICD-10-CM | POA: Diagnosis not present

## 2023-12-15 LAB — CBC
HCT: 27.9 % — ABNORMAL LOW (ref 36.0–46.0)
Hemoglobin: 9.5 g/dL — ABNORMAL LOW (ref 12.0–15.0)
MCH: 32.4 pg (ref 26.0–34.0)
MCHC: 34.1 g/dL (ref 30.0–36.0)
MCV: 95.2 fL (ref 80.0–100.0)
Platelets: 172 K/uL (ref 150–400)
RBC: 2.93 MIL/uL — ABNORMAL LOW (ref 3.87–5.11)
RDW: 15.3 % (ref 11.5–15.5)
WBC: 6.4 K/uL (ref 4.0–10.5)
nRBC: 0 % (ref 0.0–0.2)

## 2023-12-15 LAB — IRON AND TIBC
Iron: 38 ug/dL (ref 28–170)
Saturation Ratios: 12 % (ref 10.4–31.8)
TIBC: 318 ug/dL (ref 250–450)
UIBC: 280 ug/dL

## 2023-12-15 LAB — COMPREHENSIVE METABOLIC PANEL WITH GFR
ALT: 34 U/L (ref 0–44)
AST: 85 U/L — ABNORMAL HIGH (ref 15–41)
Albumin: 2.3 g/dL — ABNORMAL LOW (ref 3.5–5.0)
Alkaline Phosphatase: 87 U/L (ref 38–126)
Anion gap: 8 (ref 5–15)
BUN: 23 mg/dL — ABNORMAL HIGH (ref 6–20)
CO2: 23 mmol/L (ref 22–32)
Calcium: 8.3 mg/dL — ABNORMAL LOW (ref 8.9–10.3)
Chloride: 98 mmol/L (ref 98–111)
Creatinine, Ser: 0.54 mg/dL (ref 0.44–1.00)
GFR, Estimated: >60 mL/min (ref >60)
Glucose, Bld: 108 mg/dL — ABNORMAL HIGH (ref 70–99)
Potassium: 3.1 mmol/L — ABNORMAL LOW (ref 3.5–5.1)
Sodium: 129 mmol/L — ABNORMAL LOW (ref 135–145)
Total Bilirubin: 1.5 mg/dL — ABNORMAL HIGH (ref 0.0–1.2)
Total Protein: 7.6 g/dL (ref 6.5–8.1)

## 2023-12-15 LAB — RETICULOCYTES
Immature Retic Fract: 15.7 % (ref 2.3–15.9)
RBC.: 2.93 MIL/uL — ABNORMAL LOW (ref 3.87–5.11)
Retic Count, Absolute: 82.6 K/uL (ref 19.0–186.0)
Retic Ct Pct: 2.8 % (ref 0.4–3.1)

## 2023-12-15 LAB — VITAMIN B12: Vitamin B-12: 1255 pg/mL — ABNORMAL HIGH (ref 180–914)

## 2023-12-15 LAB — FERRITIN: Ferritin: 138 ng/mL (ref 11–307)

## 2023-12-15 LAB — FOLATE: Folate: >20 ng/mL (ref >5.9)

## 2023-12-15 MED ORDER — PREDNISONE 5 MG PO TABS
30.0000 mg | ORAL_TABLET | Freq: Every day | ORAL | Status: AC
  Administered 2023-12-16 – 2023-12-18 (×3): 30 mg via ORAL
  Filled 2023-12-15 (×3): qty 2

## 2023-12-15 MED ORDER — LIDOCAINE HCL 1 % IJ SOLN
INTRAMUSCULAR | Status: AC
  Filled 2023-12-15: qty 10

## 2023-12-15 NOTE — Progress Notes (Signed)
 [facility]                                                                              Joan Wood, is a 53 y.o. female, DOB - 1970-07-16, FMW:[id number] Admit date - 10/03/2023    Outpatient Primary MD for the patient is Bacchus, Meade PEDLAR, FNP  LOS - 73  days  Chief Complaint  Patient presents with   Emesis   Nausea   Diarrhea       Brief summary   53 y.o. female with a history of decompensated liver cirrhosis, PUD, gastric ulcer perforation status post ex lap, GERD, pancreatitis, obstructive sleep apnea. Patient presented secondary to nausea, coffee-ground emesis, bloody diarrhea with concern for upper GI bleed. 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  Hospitalization complicated by hepatic encephalopathy treated with lactulose  and rifaximin . Patient also requiring recurrent paracentesis for her recurrent ascites.   Awaiting placement  Assessment & Plan    Decompensated alcoholic cirrhosis with recurrent ascites -Underwent therapeutic paracentesis on 11/12/2023 with removal of 750 cc of ascitic fluid.  Ascitic fluid was not consistent with SBP.   - Received IV Rocephin  for UTI.  -  Patient was initially treated with rifaximin  and lactulose  for encephalopathy.  At this time, only on lactulose  has been continued since rifaximin  will not be covered at the outpatient facility.   -11/24/2023: Paracentesis with removal of 3.6 L of fluid - Dr. Sonjia spoke with IR on 11/28/23 about potential peritoneal drain placement since she had it in the past.  Given her current circumstances and need for follow-up and management of drains. it is unlikely that patient will be able to do unless she has assistance at home, or goes to SNF and somebody is there to manage her peritoneal drain.  Patient does have impaired memory and  understanding of the situation.  -Recent MELD of 35 with correlates to roughly 50% 3 month mortality. - 11/28:  paracentesis on 11/28 yielding 4.3L - Abdomen distended, still soft however patient is requesting paracentesis today   Hepatic encephalopathy -Improved.   - Continue lactulose  to keep bowel movements 3-4 times a day.     Enterobacter UTI. Treated with Bactrim  DS twice daily and has completed the course   Hypokalemia, hypomagnesemia replace as needed   Hypoalbuminemia Secondary to underlying liver disease.  Causing peripheral edema and recurrent ascites.   Thrombocytopenia Secondary to liver disease.   -Stable   Acute kidney injury Improved, likely  ATN from contrast.   Acute appendicitis General surgery was consulted and patient was thought to be high surgical risk so was treated conservatively with antibiotics.  No further abdominal pain.   Chronic hypotension Has received albumin  intermittently.  Currently on midodrine  20mg  3 times daily.    Chronic hyponatremia Patient's baseline appears to be around 125 -128.   -Continue to monitor   Acute blood loss anemia secondary to acute upper GI bleed from esophagitis and esophageal necrosis Patient was seen by GI during hospitalization - EGD on 10/04/2023 with findings of grade  D esophagitis in addition to concern for acute esophageal necrosis. Non-bleeding gastric ulcer noted GI recommended Carafate  for 2 weeks and Protonix  twice daily with outpatient GI follow-up.  Patient received 4 units of packed RBC during this hospitalization.    - H&H stable    Lower extremity edema, right ankle tenderness. Peripheral neuropathy. ?  Gout -Edema likely secondary to hypoalbuminemia from liver disease.   - Duplex ultrasound of the lower extremity negative for DVT.    -x-ray of the right ankle 11/30 without any bony issues.  x-ray hip showed no evidence of hip fracture or dislocation - Continue gabapentin , Lasix  - Slightly  elevated uric acid level 7.4 on 11/13.  Received colchicine  0.6 mg daily, currently off colchicine . - Per patient, pain in the hip and ankles better with prednisone  - Completed prednisone  40 mg for 3 days, will taper to 30 mg tomorrow -If continues to have neuropathic/radicular pain in the right lower extremity, will do MRI or CT of the lumbar spine for further evaluation.   Tobacco abuse Counseling done   History of alcohol  abuse No active issues.  Continue multivitamin and thiamine .  No signs of withdrawal.    Goals of care Plan for skilled nursing facility for rehabitation.  Difficult to place   Insomnia - Continue Seroquel    Fall -See RN documentation. Pt found on ground in feces on the early morning of 11/30 -B hip and B ankle xrays reviewed, no acute fractures    RN Pressure Injury Documentation: Wound 10/15/23 0900 Pressure Injury Sacrum Mid Stage 2 -  Partial thickness loss of dermis presenting as a shallow open injury with a red, pink wound bed without slough. (Active)  Unclear if POA  Severe protein calorie malnutrition Nutrition Problem: Severe Malnutrition Etiology: chronic illness (cirrhosis) Signs/Symptoms: severe fat depletion, severe muscle depletion Interventions: Ensure Enlive (each supplement provides 350kcal and 20 grams of protein), MVI, Liberalize Diet, Magic cup  Estimated body mass index is 23.7 kg/m as calculated from the following:   Height as of this encounter: 5' 2 (1.575 m).   Weight as of this encounter: 58.8 kg.  Code Status: Full code DVT Prophylaxis:  SCDs Start: 10/03/23 2153   Level of Care: Level of care: Progressive Family Communication:  Disposition Plan:      Remains inpatient appropriate: Awaiting placement   Procedures:  Paracentesis  Consultants:     Antimicrobials:   Anti-infectives (From admission, onward)    Start     Dose/Rate Route Frequency Ordered Stop   12/06/23 1930  fluconazole  (DIFLUCAN ) tablet 150 mg         150 mg Oral  Once 12/06/23 1831 12/06/23 1905   11/21/23 1115  sulfamethoxazole -trimethoprim  (BACTRIM  DS) 800-160 MG per tablet 1 tablet        1 tablet Oral Every 12 hours 11/21/23 1024 11/23/23 2117   11/20/23 1400  cefTRIAXone  (ROCEPHIN ) 1 g in sodium chloride  0.9 % 100 mL IVPB  Status:  Discontinued        1 g 200 mL/hr over 30 Minutes Intravenous Every 24 hours 11/20/23 1245 11/21/23 1024   11/12/23 1145  rifaximin  (XIFAXAN ) tablet 550 mg  Status:  Discontinued        550 mg Oral 2 times daily 11/12/23 1058 11/22/23 1628   10/29/23 0000  rifaximin  (XIFAXAN ) 550 MG TABS tablet        550 mg Oral 2 times daily 10/29/23 1129     10/25/23 1415  rifaximin  (XIFAXAN ) tablet 550 mg  Status:  Discontinued        550 mg Oral 2 times daily 10/25/23 1315 11/10/23 1244   10/12/23 1000  metroNIDAZOLE  (FLAGYL ) tablet 500 mg        500 mg Oral Every 12 hours 10/12/23 0734 10/22/23 2125   10/12/23 0830  cefTRIAXone  (ROCEPHIN ) 2 g in sodium chloride  0.9 % 100 mL IVPB        2 g 200 mL/hr over 30 Minutes Intravenous Every 24 hours 10/12/23 0734 10/22/23 1636   10/07/23 1000  fluconazole  (DIFLUCAN ) tablet 100 mg  Status:  Discontinued        100 mg Oral Daily 10/07/23 0853 10/07/23 0855   10/07/23 1000  fluconazole  (DIFLUCAN ) tablet 150 mg        150 mg Oral Daily 10/07/23 0855 10/07/23 1014   10/04/23 1000  cefTRIAXone  (ROCEPHIN ) 2 g in sodium chloride  0.9 % 100 mL IVPB        2 g 200 mL/hr over 30 Minutes Intravenous Every 24 hours 10/03/23 2154 10/08/23 0854   10/03/23 2015  cefTRIAXone  (ROCEPHIN ) 1 g in sodium chloride  0.9 % 100 mL IVPB        1 g 200 mL/hr over 30 Minutes Intravenous  Once 10/03/23 2010 10/03/23 2212          Medications  sodium chloride    Intravenous Once   calcium  carbonate  1 tablet Oral TID WC   feeding supplement  237 mL Oral TID BM   folic acid   1 mg Oral Daily   furosemide   40 mg Oral BID   gabapentin   600 mg Oral TID   lactulose   20 g Oral TID   lidocaine    2 patch Transdermal Q24H   lidocaine  (PF)  10 mL Other Once   midodrine   20 mg Oral TID WC   multivitamin with minerals  1 tablet Oral Daily   nutrition supplement (JUVEN)  1 packet Oral BID BM   pantoprazole   40 mg Oral BID   QUEtiapine   25 mg Oral QHS   thiamine   100 mg Oral Daily   triamcinolone  0.1 % cream : eucerin   Topical Daily      Subjective:   Joan Wood was seen and examined today.  States still has pain in the right hip due to ankles.  Abdomen distended but still somewhat soft.  Wants paracentesis today.    Objective:   Vitals:   12/14/23 1710 12/14/23 2040 12/15/23 0045 12/15/23 0507  BP: 128/82 129/75 115/68 125/62  Pulse: 84 78 80 76  Resp: 16 16 16 16   Temp: 97.7 F (36.5 C) 97.9 F (36.6 C) 98 F (36.7 C) 98 F (36.7 C)  TempSrc: Oral Oral Oral Oral  SpO2: 100% 98% 96% 98%  Weight:      Height:        Intake/Output Summary (Last 24 hours) at 12/15/2023 1123 Last data filed at 12/14/2023 2330 Gross per 24 hour  Intake 1077 ml  Output --  Net 1077 ml     Wt Readings from Last 3 Encounters:  12/14/23 58.8 kg  09/17/23 51.7 kg  09/17/23 51.7 kg   Physical Exam General: Alert and oriented x 3, NAD Cardiovascular: S1 S2 clear, RRR.  Respiratory: CTAB Gastrointestinal: Soft, nontender, more distended, NBS Ext: no pedal edema bilaterally Neuro: no new deficits Psych: Normal affect      Data Reviewed:  I have personally reviewed following labs    CBC Lab Results  Component Value  Date   WBC 6.4 12/15/2023   RBC 2.93 (L) 12/15/2023   HGB 9.5 (L) 12/15/2023   HCT 27.9 (L) 12/15/2023   MCV 95.2 12/15/2023   MCH 32.4 12/15/2023   PLT 172 12/15/2023   MCHC 34.1 12/15/2023   RDW 15.3 12/15/2023   LYMPHSABS 1.3 10/31/2023   MONOABS 1.3 (H) 10/31/2023   EOSABS 0.1 10/31/2023   BASOSABS 0.0 10/31/2023     Last metabolic panel Lab Results  Component Value Date   NA 129 (L) 12/15/2023   K 3.1 (L) 12/15/2023   CL 98 12/15/2023   CO2  23 12/15/2023   BUN 23 (H) 12/15/2023   CREATININE 0.54 12/15/2023   GLUCOSE 108 (H) 12/15/2023   GFRNONAA >60 12/15/2023   CALCIUM  8.3 (L) 12/15/2023   PHOS 3.9 11/30/2023   PROT 7.6 12/15/2023   ALBUMIN  2.3 (L) 12/15/2023   LABGLOB 4.4 07/30/2023   AGRATIO 1.2 03/28/2022   BILITOT 1.5 (H) 12/15/2023   ALKPHOS 87 12/15/2023   AST 85 (H) 12/15/2023   ALT 34 12/15/2023   ANIONGAP 8 12/15/2023    CBG (last 3)  No results for input(s): GLUCAP in the last 72 hours.    Coagulation Profile: No results for input(s): INR, PROTIME in the last 168 hours.   Radiology Studies: I have personally reviewed the imaging studies  No results found.     Nydia Distance M.D. [facility] 12/15/2023, 11:23 AM  Available via Epic secure chat 7am-7pm After 7 pm, please refer to night coverage provider listed on amion.

## 2023-12-15 NOTE — Plan of Care (Signed)
  Problem: Health Behavior/Discharge Planning: Goal: Ability to manage health-related needs will improve Outcome: Progressing   Problem: Clinical Measurements: Goal: Ability to maintain clinical measurements within normal limits will improve Outcome: Progressing   Problem: Clinical Measurements: Goal: Diagnostic test results will improve Outcome: Progressing   

## 2023-12-16 ENCOUNTER — Inpatient Hospital Stay (HOSPITAL_COMMUNITY)

## 2023-12-16 DIAGNOSIS — K7460 Unspecified cirrhosis of liver: Secondary | ICD-10-CM | POA: Diagnosis not present

## 2023-12-16 DIAGNOSIS — E871 Hypo-osmolality and hyponatremia: Secondary | ICD-10-CM | POA: Diagnosis not present

## 2023-12-16 DIAGNOSIS — K922 Gastrointestinal hemorrhage, unspecified: Secondary | ICD-10-CM | POA: Diagnosis not present

## 2023-12-16 DIAGNOSIS — R197 Diarrhea, unspecified: Secondary | ICD-10-CM | POA: Diagnosis not present

## 2023-12-16 DIAGNOSIS — D62 Acute posthemorrhagic anemia: Secondary | ICD-10-CM | POA: Diagnosis not present

## 2023-12-16 DIAGNOSIS — R112 Nausea with vomiting, unspecified: Secondary | ICD-10-CM | POA: Diagnosis not present

## 2023-12-16 DIAGNOSIS — K7290 Hepatic failure, unspecified without coma: Secondary | ICD-10-CM | POA: Diagnosis not present

## 2023-12-16 MED ORDER — HYDROMORPHONE HCL 1 MG/ML IJ SOLN
1.0000 mg | INTRAMUSCULAR | Status: DC | PRN
  Administered 2023-12-16 – 2023-12-21 (×7): 1 mg via INTRAVENOUS
  Filled 2023-12-16 (×8): qty 1

## 2023-12-16 NOTE — Plan of Care (Signed)

## 2023-12-16 NOTE — Procedures (Signed)
 PROCEDURE SUMMARY:  Successful US  guided paracentesis from right lateral abdomen.  Yielded 1.95 liters of hazy yellow fluid.  No immediate complications.  Patient tolerated well.  EBL = trace   Arliene Rosenow [name] Bal PA-C 12/16/2023 9:39 AM

## 2023-12-16 NOTE — Progress Notes (Signed)
 [facility]                                                                              Joan Wood, is a 53 y.o. female, DOB - August 08, 1970, FMW:[id number] Admit date - 10/03/2023    Outpatient Primary MD for the patient is Bacchus, Meade PEDLAR, FNP  LOS - 74  days  Chief Complaint  Patient presents with   Emesis   Nausea   Diarrhea       Brief summary   53 y.o. female with a history of decompensated liver cirrhosis, PUD, gastric ulcer perforation status post ex lap, GERD, pancreatitis, obstructive sleep apnea. Patient presented secondary to nausea, coffee-ground emesis, bloody diarrhea with concern for upper GI bleed. 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  Hospitalization complicated by hepatic encephalopathy treated with lactulose  and rifaximin . Patient also requiring recurrent paracentesis for her recurrent ascites.   Awaiting placement  Assessment & Plan    Decompensated alcoholic cirrhosis with recurrent ascites -Underwent therapeutic paracentesis on 11/12/2023 with removal of 750 cc of ascitic fluid.  Ascitic fluid was not consistent with SBP.   - Received IV Rocephin  for UTI.  -  Patient was initially treated with rifaximin  and lactulose  for encephalopathy.  At this time, only on lactulose  has been continued since rifaximin  will not be covered at the outpatient facility.   -11/24/2023: Paracentesis with removal of 3.6 L of fluid - Dr. Sonjia spoke with IR on 11/28/23 about potential peritoneal drain placement since she had it in the past.  Given her current circumstances and need for follow-up and management of drains. it is unlikely that patient will be able to do unless she has assistance at home, or goes to SNF and somebody is there to manage her peritoneal drain.  Patient does have impaired memory and  understanding of the situation.  -Recent MELD of 35 with correlates to roughly 50% 3 month mortality. - 11/28:  paracentesis on 11/28 yielding 4.3L - 12/7: Paracentesis with 1.95 L removed  Hepatic encephalopathy -Improved.   - Continue lactulose  to keep bowel movements 3-4 times a day.     Enterobacter UTI. Treated with Bactrim  DS twice daily and has completed the course   Hypokalemia, hypomagnesemia replace as needed   Hypoalbuminemia Secondary to underlying liver disease.  Causing peripheral edema and recurrent ascites.   Thrombocytopenia Secondary to liver disease.   -Stable   Acute kidney injury Improved, likely  ATN from contrast.   Acute appendicitis General surgery was consulted and patient was thought to be high surgical risk so was treated conservatively with antibiotics.  No further abdominal pain.   Chronic hypotension Has received albumin  intermittently.  Currently on midodrine  20mg  3 times daily.    Chronic hyponatremia Patient's baseline appears to be around 125 -128.   -Continue to monitor   Acute blood loss anemia secondary to acute upper GI bleed from esophagitis and esophageal necrosis Patient was seen by GI during hospitalization - EGD on 10/04/2023 with findings of grade D esophagitis in addition to  concern for acute esophageal necrosis. Non-bleeding gastric ulcer noted GI recommended Carafate  for 2 weeks and Protonix  twice daily with outpatient GI follow-up.  Patient received 4 units of packed RBC during this hospitalization.    - H&H stable    Lower extremity edema, right ankle tenderness. Peripheral neuropathy. ?  Gout -Edema likely secondary to hypoalbuminemia from liver disease.   - Duplex ultrasound of the lower extremity negative for DVT.    -x-ray of the right ankle 11/30 without any bony issues.  x-ray hip showed no evidence of hip fracture or dislocation - Continue gabapentin , Lasix  - Slightly elevated uric acid level 7.4 on 11/13.  Received  colchicine  0.6 mg daily, currently off colchicine . - Per patient, pain in the hip and ankles better with prednisone  - Tapered prednisone  to 30 mg for 3 days, continue 3-day taper -If continues to have neuropathic/radicular pain in the right lower extremity, will do MRI or CT of the lumbar spine for further evaluation.   Tobacco abuse Counseling done   History of alcohol  abuse No active issues.  Continue multivitamin and thiamine .  No signs of withdrawal.    Goals of care Plan for skilled nursing facility for rehabitation.  Difficult to place   Insomnia - Continue Seroquel    Fall -See RN documentation. Pt found on ground in feces on the early morning of 11/30 -B hip and B ankle xrays reviewed, no acute fractures    RN Pressure Injury Documentation: Wound 10/15/23 0900 Pressure Injury Sacrum Mid Stage 2 -  Partial thickness loss of dermis presenting as a shallow open injury with a red, pink wound bed without slough. (Active)  Unclear if POA  Severe protein calorie malnutrition Nutrition Problem: Severe Malnutrition Etiology: chronic illness (cirrhosis) Signs/Symptoms: severe fat depletion, severe muscle depletion Interventions: Ensure Enlive (each supplement provides 350kcal and 20 grams of protein), MVI, Liberalize Diet, Magic cup  Estimated body mass index is 23.71 kg/m as calculated from the following:   Height as of this encounter: 5' 2 (1.575 m).   Weight as of this encounter: 58.8 kg.  Code Status: Full code DVT Prophylaxis:  SCDs Start: 10/03/23 2153   Level of Care: Level of care: Progressive Family Communication:  Disposition Plan:      Remains inpatient appropriate: Awaiting placement   Procedures:  Paracentesis  Consultants:     Antimicrobials:   Anti-infectives (From admission, onward)    Start     Dose/Rate Route Frequency Ordered Stop   12/06/23 1930  fluconazole  (DIFLUCAN ) tablet 150 mg        150 mg Oral  Once 12/06/23 1831 12/06/23 1905    11/21/23 1115  sulfamethoxazole -trimethoprim  (BACTRIM  DS) 800-160 MG per tablet 1 tablet        1 tablet Oral Every 12 hours 11/21/23 1024 11/23/23 2117   11/20/23 1400  cefTRIAXone  (ROCEPHIN ) 1 g in sodium chloride  0.9 % 100 mL IVPB  Status:  Discontinued        1 g 200 mL/hr over 30 Minutes Intravenous Every 24 hours 11/20/23 1245 11/21/23 1024   11/12/23 1145  rifaximin  (XIFAXAN ) tablet 550 mg  Status:  Discontinued        550 mg Oral 2 times daily 11/12/23 1058 11/22/23 1628   10/29/23 0000  rifaximin  (XIFAXAN ) 550 MG TABS tablet        550 mg Oral 2 times daily 10/29/23 1129     10/25/23 1415  rifaximin  (XIFAXAN ) tablet 550 mg  Status:  Discontinued  550 mg Oral 2 times daily 10/25/23 1315 11/10/23 1244   10/12/23 1000  metroNIDAZOLE  (FLAGYL ) tablet 500 mg        500 mg Oral Every 12 hours 10/12/23 0734 10/22/23 2125   10/12/23 0830  cefTRIAXone  (ROCEPHIN ) 2 g in sodium chloride  0.9 % 100 mL IVPB        2 g 200 mL/hr over 30 Minutes Intravenous Every 24 hours 10/12/23 0734 10/22/23 1636   10/07/23 1000  fluconazole  (DIFLUCAN ) tablet 100 mg  Status:  Discontinued        100 mg Oral Daily 10/07/23 0853 10/07/23 0855   10/07/23 1000  fluconazole  (DIFLUCAN ) tablet 150 mg        150 mg Oral Daily 10/07/23 0855 10/07/23 1014   10/04/23 1000  cefTRIAXone  (ROCEPHIN ) 2 g in sodium chloride  0.9 % 100 mL IVPB        2 g 200 mL/hr over 30 Minutes Intravenous Every 24 hours 10/03/23 2154 10/08/23 0854   10/03/23 2015  cefTRIAXone  (ROCEPHIN ) 1 g in sodium chloride  0.9 % 100 mL IVPB        1 g 200 mL/hr over 30 Minutes Intravenous  Once 10/03/23 2010 10/03/23 2212          Medications  sodium chloride    Intravenous Once   calcium  carbonate  1 tablet Oral TID WC   feeding supplement  237 mL Oral TID BM   folic acid   1 mg Oral Daily   furosemide   40 mg Oral BID   gabapentin   600 mg Oral TID   lactulose   20 g Oral TID   lidocaine   2 patch Transdermal Q24H   lidocaine  (PF)  10 mL  Other Once   midodrine   20 mg Oral TID WC   multivitamin with minerals  1 tablet Oral Daily   nutrition supplement (JUVEN)  1 packet Oral BID BM   pantoprazole   40 mg Oral BID   predniSONE   30 mg Oral QAC breakfast   QUEtiapine   25 mg Oral QHS   thiamine   100 mg Oral Daily   triamcinolone  0.1 % cream : eucerin   Topical Daily      Subjective:   Joan Wood was seen and examined today.  Patient seen after paracentesis, stable and no acute complaints.  Underwent paracentesis today, 1.95L removed.    Objective:   Vitals:   12/16/23 0459 12/16/23 0608 12/16/23 0731 12/16/23 1100  BP:  129/72 120/80 121/75  Pulse:  71 92   Resp:  18 18   Temp:  97.9 F (36.6 C) 98.3 F (36.8 C)   TempSrc:  Oral Oral   SpO2:   100%   Weight: 58.8 kg     Height:        Intake/Output Summary (Last 24 hours) at 12/16/2023 1134 Last data filed at 12/15/2023 1500 Gross per 24 hour  Intake 120 ml  Output --  Net 120 ml     Wt Readings from Last 3 Encounters:  12/16/23 58.8 kg  09/17/23 51.7 kg  09/17/23 51.7 kg    Physical Exam General: Alert and oriented x 3, NAD Cardiovascular: S1 S2 clear, RRR.  Respiratory: CTAB, no wheezing Gastrointestinal: Soft, + distended, NBS  ext: no pedal edema bilaterally Neuro: no new deficits Psych: Normal affect     Data Reviewed:  I have personally reviewed following labs    CBC Lab Results  Component Value Date   WBC 6.4 12/15/2023   RBC 2.93 (L)  12/15/2023   HGB 9.5 (L) 12/15/2023   HCT 27.9 (L) 12/15/2023   MCV 95.2 12/15/2023   MCH 32.4 12/15/2023   PLT 172 12/15/2023   MCHC 34.1 12/15/2023   RDW 15.3 12/15/2023   LYMPHSABS 1.3 10/31/2023   MONOABS 1.3 (H) 10/31/2023   EOSABS 0.1 10/31/2023   BASOSABS 0.0 10/31/2023     Last metabolic panel Lab Results  Component Value Date   NA 129 (L) 12/15/2023   K 3.1 (L) 12/15/2023   CL 98 12/15/2023   CO2 23 12/15/2023   BUN 23 (H) 12/15/2023   CREATININE 0.54 12/15/2023   GLUCOSE  108 (H) 12/15/2023   GFRNONAA >60 12/15/2023   CALCIUM  8.3 (L) 12/15/2023   PHOS 3.9 11/30/2023   PROT 7.6 12/15/2023   ALBUMIN  2.3 (L) 12/15/2023   LABGLOB 4.4 07/30/2023   AGRATIO 1.2 03/28/2022   BILITOT 1.5 (H) 12/15/2023   ALKPHOS 87 12/15/2023   AST 85 (H) 12/15/2023   ALT 34 12/15/2023   ANIONGAP 8 12/15/2023    CBG (last 3)  No results for input(s): GLUCAP in the last 72 hours.    Coagulation Profile: No results for input(s): INR, PROTIME in the last 168 hours.   Radiology Studies: I have personally reviewed the imaging studies  No results found.     Joan Wood M.D. [facility] 12/16/2023, 11:34 AM  Available via Epic secure chat 7am-7pm After 7 pm, please refer to night coverage provider listed on amion.

## 2023-12-16 NOTE — Plan of Care (Signed)
  Problem: Health Behavior/Discharge Planning: Goal: Ability to manage health-related needs will improve Outcome: Progressing   Problem: Clinical Measurements: Goal: Will remain free from infection Outcome: Progressing   Problem: Clinical Measurements: Goal: Respiratory complications will improve Outcome: Progressing   Problem: Activity: Goal: Risk for activity intolerance will decrease Outcome: Progressing   

## 2023-12-17 ENCOUNTER — Inpatient Hospital Stay (HOSPITAL_COMMUNITY)

## 2023-12-17 DIAGNOSIS — K922 Gastrointestinal hemorrhage, unspecified: Secondary | ICD-10-CM | POA: Diagnosis not present

## 2023-12-17 DIAGNOSIS — R197 Diarrhea, unspecified: Secondary | ICD-10-CM | POA: Diagnosis not present

## 2023-12-17 DIAGNOSIS — R112 Nausea with vomiting, unspecified: Secondary | ICD-10-CM | POA: Diagnosis not present

## 2023-12-17 DIAGNOSIS — E871 Hypo-osmolality and hyponatremia: Secondary | ICD-10-CM | POA: Diagnosis not present

## 2023-12-17 DIAGNOSIS — K7460 Unspecified cirrhosis of liver: Secondary | ICD-10-CM | POA: Diagnosis not present

## 2023-12-17 NOTE — Plan of Care (Signed)
  Problem: Clinical Measurements: Goal: Will remain free from infection Outcome: Progressing Goal: Respiratory complications will improve Outcome: Progressing Goal: Cardiovascular complication will be avoided Outcome: Progressing   Problem: Activity: Goal: Risk for activity intolerance will decrease Outcome: Progressing   Problem: Pain Managment: Goal: General experience of comfort will improve and/or be controlled Outcome: Not Progressing

## 2023-12-17 NOTE — Progress Notes (Signed)
 [facility]                                                                              Joan Wood, is a 53 y.o. female, DOB - [birth year], FMW:[id number] Admit date - 10/03/2023    Outpatient Primary MD for the patient is Bacchus, Meade PEDLAR, FNP  LOS - 75  days  Chief Complaint  Patient presents with   Emesis   Nausea   Diarrhea       Brief summary   53 y.o. female with a history of decompensated liver cirrhosis, PUD, gastric ulcer perforation status post ex lap, GERD, pancreatitis, obstructive sleep apnea. Patient presented secondary to nausea, coffee-ground emesis, bloody diarrhea with concern for upper GI bleed. 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  Hospitalization complicated by hepatic encephalopathy treated with lactulose  and rifaximin . Patient also requiring recurrent paracentesis for her recurrent ascites.   Awaiting placement  Assessment & Plan    Decompensated alcoholic cirrhosis with recurrent ascites -Underwent therapeutic paracentesis on 11/12/2023 with removal of 750 cc of ascitic fluid.  Ascitic fluid was not consistent with SBP.   - Received IV Rocephin  for UTI.  -  Patient was initially treated with rifaximin  and lactulose  for encephalopathy.  At this time, only on lactulose  has been continued since rifaximin  will not be covered at the outpatient facility.   -11/24/2023: Paracentesis with removal of 3.6 L of fluid - Dr. Sonjia spoke with IR on 11/28/23 about potential peritoneal drain placement since she had it in the past.  Given her current circumstances and need for follow-up and management of drains. it is unlikely that patient will be able to do unless she has assistance at home, or goes to SNF and somebody is there to manage her peritoneal drain.  Patient does have impaired memory and  understanding of the situation.  -Recent MELD of 35 with correlates to roughly 50% 3 month mortality. - 11/28:  paracentesis on 11/28 yielding 4.3L - 12/7: Paracentesis with 1.95 L removed   Hepatic encephalopathy -Improved.   - Continue lactulose  to keep bowel movements 3-4 times a day.     Enterobacter UTI. Treated with Bactrim  DS twice daily and has completed the course   Hypokalemia, hypomagnesemia replace as needed   Hypoalbuminemia Secondary to underlying liver disease.  Causing peripheral edema and recurrent ascites.   Thrombocytopenia Secondary to liver disease.   -Stable   Acute kidney injury Improved, likely  ATN from contrast.   Acute appendicitis General surgery was consulted and patient was thought to be high surgical risk so was treated conservatively with antibiotics.  No further abdominal pain.   Chronic hypotension Has received albumin  intermittently.  Currently on midodrine  20mg  3 times daily.    Chronic hyponatremia Patient's baseline appears to be around 125 -128.   -Continue to monitor   Acute blood loss anemia secondary to acute upper GI bleed from esophagitis and esophageal necrosis Patient was seen by GI during hospitalization - EGD on 10/04/2023 with findings of grade D esophagitis in addition  to concern for acute esophageal necrosis. Non-bleeding gastric ulcer noted GI recommended Carafate  for 2 weeks and Protonix  twice daily with outpatient GI follow-up.  Patient received 4 units of packed RBC during this hospitalization.    - H&H stable    Lower extremity edema, Peripheral neuropathy.  Gout -Edema likely secondary to hypoalbuminemia from liver disease.   - Duplex ultrasound of the lower extremity negative for DVT.    -x-ray of the right ankle 11/30 without any bony issues.  x-ray hip showed no evidence of hip fracture or dislocation - Continue gabapentin , Lasix  - uric acid level 7.4 on 11/13.  Received colchicine  0.6 mg daily, currently off  colchicine . - Tapered prednisone  to 30 mg for 3 days, continue 3-day taper  Low back pain with right-sided radiculopathy - Continues to complain of low back pain and right hip pain radiating to the leg to ankles.  - Obtain MRI L-spine   Tobacco abuse Counseling done   History of alcohol  abuse No active issues.  Continue multivitamin and thiamine .  No signs of withdrawal.    Goals of care Plan for skilled nursing facility for rehabitation.  Difficult to place   Insomnia - Continue Seroquel    Fall -See RN documentation. Pt found on ground in feces on the early morning of 11/30 -B hip and B ankle xrays reviewed, no acute fractures    RN Pressure Injury Documentation: Wound 10/15/23 0900 Pressure Injury Sacrum Mid Stage 2 -  Partial thickness loss of dermis presenting as a shallow open injury with a red, pink wound bed without slough. (Active)  Unclear if POA  Severe protein calorie malnutrition Nutrition Problem: Severe Malnutrition Etiology: chronic illness (cirrhosis) Signs/Symptoms: severe fat depletion, severe muscle depletion Interventions: Ensure Enlive (each supplement provides 350kcal and 20 grams of protein), MVI, Liberalize Diet, Magic cup  Estimated body mass index is 24.05 kg/m (pended) as calculated from the following:   Height as of this encounter: 5' 2 (1.575 m).   Weight as of this encounter: (P) 59.6 kg.  Code Status: Full code DVT Prophylaxis:  SCDs Start: 10/03/23 2153   Level of Care: Level of care: Progressive Family Communication:  Disposition Plan:      Remains inpatient appropriate: Awaiting placement   Procedures:  Paracentesis  Consultants:     Antimicrobials:   Anti-infectives (From admission, onward)    Start     Dose/Rate Route Frequency Ordered Stop   12/06/23 1930  fluconazole  (DIFLUCAN ) tablet 150 mg        150 mg Oral  Once 12/06/23 1831 12/06/23 1905   11/21/23 1115  sulfamethoxazole -trimethoprim  (BACTRIM  DS) 800-160 MG per  tablet 1 tablet        1 tablet Oral Every 12 hours 11/21/23 1024 11/23/23 2117   11/20/23 1400  cefTRIAXone  (ROCEPHIN ) 1 g in sodium chloride  0.9 % 100 mL IVPB  Status:  Discontinued        1 g 200 mL/hr over 30 Minutes Intravenous Every 24 hours 11/20/23 1245 11/21/23 1024   11/12/23 1145  rifaximin  (XIFAXAN ) tablet 550 mg  Status:  Discontinued        550 mg Oral 2 times daily 11/12/23 1058 11/22/23 1628   10/29/23 0000  rifaximin  (XIFAXAN ) 550 MG TABS tablet        550 mg Oral 2 times daily 10/29/23 1129     10/25/23 1415  rifaximin  (XIFAXAN ) tablet 550 mg  Status:  Discontinued        550 mg Oral 2  times daily 10/25/23 1315 11/10/23 1244   10/12/23 1000  metroNIDAZOLE  (FLAGYL ) tablet 500 mg        500 mg Oral Every 12 hours 10/12/23 0734 10/22/23 2125   10/12/23 0830  cefTRIAXone  (ROCEPHIN ) 2 g in sodium chloride  0.9 % 100 mL IVPB        2 g 200 mL/hr over 30 Minutes Intravenous Every 24 hours 10/12/23 0734 10/22/23 1636   10/07/23 1000  fluconazole  (DIFLUCAN ) tablet 100 mg  Status:  Discontinued        100 mg Oral Daily 10/07/23 0853 10/07/23 0855   10/07/23 1000  fluconazole  (DIFLUCAN ) tablet 150 mg        150 mg Oral Daily 10/07/23 0855 10/07/23 1014   10/04/23 1000  cefTRIAXone  (ROCEPHIN ) 2 g in sodium chloride  0.9 % 100 mL IVPB        2 g 200 mL/hr over 30 Minutes Intravenous Every 24 hours 10/03/23 2154 10/08/23 0854   10/03/23 2015  cefTRIAXone  (ROCEPHIN ) 1 g in sodium chloride  0.9 % 100 mL IVPB        1 g 200 mL/hr over 30 Minutes Intravenous  Once 10/03/23 2010 10/03/23 2212          Medications  sodium chloride    Intravenous Once   calcium  carbonate  1 tablet Oral TID WC   feeding supplement  237 mL Oral TID BM   folic acid   1 mg Oral Daily   furosemide   40 mg Oral BID   gabapentin   600 mg Oral TID   lactulose   20 g Oral TID   lidocaine   2 patch Transdermal Q24H   lidocaine  (PF)  10 mL Other Once   midodrine   20 mg Oral TID WC   multivitamin with minerals  1  tablet Oral Daily   nutrition supplement (JUVEN)  1 packet Oral BID BM   pantoprazole   40 mg Oral BID   predniSONE   30 mg Oral QAC breakfast   QUEtiapine   25 mg Oral QHS   thiamine   100 mg Oral Daily   triamcinolone  0.1 % cream : eucerin   Topical Daily      Subjective:   Joan Wood was seen and examined today.  Complains of lower back pain radiating to the right hip,.  Status post paracentesis on 12/7, still distended   Objective:   Vitals:   12/17/23 0423 12/17/23 0600 12/17/23 0827 12/17/23 0921  BP: (!) 108/58  112/74 131/82  Pulse: 89  82 81  Resp: 14  16 18   Temp: 98.7 F (37.1 C)   97.9 F (36.6 C)  TempSrc: Oral   Oral  SpO2: 99%  100% 100%  Weight:  (P) 59.6 kg    Height:        Intake/Output Summary (Last 24 hours) at 12/17/2023 1228 Last data filed at 12/16/2023 1800 Gross per 24 hour  Intake 120 ml  Output --  Net 120 ml     Wt Readings from Last 3 Encounters:  12/17/23 (P) 59.6 kg  09/17/23 51.7 kg  09/17/23 51.7 kg   Physical Exam General: Alert and oriented x 3, NAD Cardiovascular: S1 S2 clear, RRR.  Respiratory: CTAB Gastrointestinal: Soft, nontender, distended, NBS Ext: no pedal edema bilaterally Neuro: no new deficits Psych: Normal affect     Data Reviewed:  I have personally reviewed following labs    CBC Lab Results  Component Value Date   WBC 6.4 12/15/2023   RBC 2.93 (L) 12/15/2023  HGB 9.5 (L) 12/15/2023   HCT 27.9 (L) 12/15/2023   MCV 95.2 12/15/2023   MCH 32.4 12/15/2023   PLT 172 12/15/2023   MCHC 34.1 12/15/2023   RDW 15.3 12/15/2023   LYMPHSABS 1.3 10/31/2023   MONOABS 1.3 (H) 10/31/2023   EOSABS 0.1 10/31/2023   BASOSABS 0.0 10/31/2023     Last metabolic panel Lab Results  Component Value Date   NA 129 (L) 12/15/2023   K 3.1 (L) 12/15/2023   CL 98 12/15/2023   CO2 23 12/15/2023   BUN 23 (H) 12/15/2023   CREATININE 0.54 12/15/2023   GLUCOSE 108 (H) 12/15/2023   GFRNONAA >60 12/15/2023   CALCIUM  8.3  (L) 12/15/2023   PHOS 3.9 11/30/2023   PROT 7.6 12/15/2023   ALBUMIN  2.3 (L) 12/15/2023   LABGLOB 4.4 07/30/2023   AGRATIO 1.2 03/28/2022   BILITOT 1.5 (H) 12/15/2023   ALKPHOS 87 12/15/2023   AST 85 (H) 12/15/2023   ALT 34 12/15/2023   ANIONGAP 8 12/15/2023    CBG (last 3)  No results for input(s): GLUCAP in the last 72 hours.    Coagulation Profile: No results for input(s): INR, PROTIME in the last 168 hours.   Radiology Studies: I have personally reviewed the imaging studies  US  Paracentesis Result Date: 12/16/2023 INDICATION: [id number] Ascites 857 53 year old female with a history of decompensated alcoholic cirrhosis and recurrent ascites. Request for therapeutic paracentesis. EXAM: ULTRASOUND GUIDED THERAPEUTIC PARACENTESIS MEDICATIONS: 1% lidocaine , 10 mL. COMPLICATIONS: None immediate. PROCEDURE: Informed written consent was obtained from the patient after a discussion of the risks, benefits and alternatives to treatment. A timeout was performed prior to the initiation of the procedure. Initial ultrasound scanning demonstrates a large amount of ascites within the right lower abdominal quadrant. The right lower abdomen was prepped and draped in the usual sterile fashion. 1% lidocaine  was used for local anesthesia. Following this, a 19 gauge, 7-cm, Yueh catheter was introduced. An ultrasound image was saved for documentation purposes. The paracentesis was performed. The catheter was removed and a dressing was applied. The patient tolerated the procedure well without immediate post procedural complication. FINDINGS: A total of approximately 1.95 L of hazy yellow fluid was removed. IMPRESSION: Successful ultrasound-guided therapeutic paracentesis yielding 1.95 L of peritoneal fluid. Procedure performed by: Sherrilee Bal, PA-C under the supervision of Dr. JINNY Hall. PLAN: The patient has required >/=2 paracenteses in a 30 day period and a formal evaluation by the [facility]  Interventional Radiology Portal Hypertension [facility] has been arranged. Thom Hall, MD [facility] Radiology Electronically Signed   By: Thom Hall M.D.   On: 12/16/2023 11:46       Narelle Schoening M.D. [facility] 12/17/2023, 12:28 PM  Available via Epic secure chat 7am-7pm After 7 pm, please refer to night coverage provider listed on amion.

## 2023-12-17 NOTE — Plan of Care (Signed)
  Problem: Health Behavior/Discharge Planning: Goal: Ability to manage health-related needs will improve Outcome: Progressing   Problem: Clinical Measurements: Goal: Respiratory complications will improve Outcome: Progressing Goal: Cardiovascular complication will be avoided Outcome: Progressing   Problem: Activity: Goal: Risk for activity intolerance will decrease Outcome: Progressing   Problem: Nutrition: Goal: Adequate nutrition will be maintained Outcome: Progressing   Problem: Elimination: Goal: Will not experience complications related to bowel motility Outcome: Progressing Goal: Will not experience complications related to urinary retention Outcome: Progressing   Problem: Safety: Goal: Ability to remain free from injury will improve Outcome: Progressing   Problem: Skin Integrity: Goal: Risk for impaired skin integrity will decrease Outcome: Progressing

## 2023-12-18 ENCOUNTER — Inpatient Hospital Stay (HOSPITAL_COMMUNITY)

## 2023-12-18 DIAGNOSIS — R197 Diarrhea, unspecified: Secondary | ICD-10-CM | POA: Diagnosis not present

## 2023-12-18 DIAGNOSIS — E871 Hypo-osmolality and hyponatremia: Secondary | ICD-10-CM | POA: Diagnosis not present

## 2023-12-18 DIAGNOSIS — R112 Nausea with vomiting, unspecified: Secondary | ICD-10-CM | POA: Diagnosis not present

## 2023-12-18 DIAGNOSIS — K7460 Unspecified cirrhosis of liver: Secondary | ICD-10-CM | POA: Diagnosis not present

## 2023-12-18 DIAGNOSIS — D62 Acute posthemorrhagic anemia: Secondary | ICD-10-CM | POA: Diagnosis not present

## 2023-12-18 DIAGNOSIS — K922 Gastrointestinal hemorrhage, unspecified: Secondary | ICD-10-CM | POA: Diagnosis not present

## 2023-12-18 LAB — COMPREHENSIVE METABOLIC PANEL WITH GFR
ALT: 38 U/L (ref 0–44)
AST: 77 U/L — ABNORMAL HIGH (ref 15–41)
Albumin: 1.9 g/dL — ABNORMAL LOW (ref 3.5–5.0)
Alkaline Phosphatase: 79 U/L (ref 38–126)
Anion gap: 16 — ABNORMAL HIGH (ref 5–15)
BUN: 23 mg/dL — ABNORMAL HIGH (ref 6–20)
CO2: 19 mmol/L — ABNORMAL LOW (ref 22–32)
Calcium: 8.4 mg/dL — ABNORMAL LOW (ref 8.9–10.3)
Chloride: 99 mmol/L (ref 98–111)
Creatinine, Ser: 0.74 mg/dL (ref 0.44–1.00)
GFR, Estimated: >60 mL/min (ref >60)
Glucose, Bld: 77 mg/dL (ref 70–99)
Potassium: 3.3 mmol/L — ABNORMAL LOW (ref 3.5–5.1)
Sodium: 134 mmol/L — ABNORMAL LOW (ref 135–145)
Total Bilirubin: 1.2 mg/dL (ref 0.0–1.2)
Total Protein: 6.7 g/dL (ref 6.5–8.1)

## 2023-12-18 LAB — CBC
HCT: 24.6 % — ABNORMAL LOW (ref 36.0–46.0)
Hemoglobin: 8.5 g/dL — ABNORMAL LOW (ref 12.0–15.0)
MCH: 32 pg (ref 26.0–34.0)
MCHC: 34.6 g/dL (ref 30.0–36.0)
MCV: 92.5 fL (ref 80.0–100.0)
Platelets: 161 K/uL (ref 150–400)
RBC: 2.66 MIL/uL — ABNORMAL LOW (ref 3.87–5.11)
RDW: 15.1 % (ref 11.5–15.5)
WBC: 5.7 K/uL (ref 4.0–10.5)
nRBC: 0 % (ref 0.0–0.2)

## 2023-12-18 MED ORDER — POTASSIUM CHLORIDE CRYS ER 20 MEQ PO TBCR
40.0000 meq | EXTENDED_RELEASE_TABLET | Freq: Once | ORAL | Status: AC
  Administered 2023-12-18: 40 meq via ORAL
  Filled 2023-12-18: qty 2

## 2023-12-18 MED ORDER — PREDNISONE 20 MG PO TABS
20.0000 mg | ORAL_TABLET | Freq: Every day | ORAL | Status: AC
  Administered 2023-12-19 – 2023-12-21 (×3): 20 mg via ORAL
  Filled 2023-12-18 (×3): qty 1

## 2023-12-18 NOTE — Plan of Care (Signed)
  Problem: Clinical Measurements: Goal: Respiratory complications will improve Outcome: Progressing Goal: Cardiovascular complication will be avoided Outcome: Progressing   Problem: Activity: Goal: Risk for activity intolerance will decrease Outcome: Progressing   Problem: Nutrition: Goal: Adequate nutrition will be maintained Outcome: Progressing   Problem: Coping: Goal: Level of anxiety will decrease Outcome: Progressing   Problem: Elimination: Goal: Will not experience complications related to bowel motility Outcome: Progressing Goal: Will not experience complications related to urinary retention Outcome: Progressing   Problem: Skin Integrity: Goal: Risk for impaired skin integrity will decrease Outcome: Progressing

## 2023-12-18 NOTE — Progress Notes (Signed)
 [facility]                                                                              Joan Wood, is a 53 y.o. female, DOB - [birth year], FMW:[id number] Admit date - 10/03/2023    Outpatient Primary MD for the patient is Bacchus, Meade PEDLAR, FNP  LOS - 76  days  Chief Complaint  Patient presents with   Emesis   Nausea   Diarrhea       Brief summary   53 y.o. female with a history of decompensated liver cirrhosis, PUD, gastric ulcer perforation status post ex lap, GERD, pancreatitis, obstructive sleep apnea. Patient presented secondary to nausea, coffee-ground emesis, bloody diarrhea with concern for upper GI bleed. 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  Hospitalization complicated by hepatic encephalopathy treated with lactulose  and rifaximin . Patient also requiring recurrent paracentesis for her recurrent ascites.   Awaiting placement  Assessment & Plan    Decompensated alcoholic cirrhosis with recurrent ascites -Underwent therapeutic paracentesis on 11/12/2023 with removal of 750 cc of ascitic fluid.  Ascitic fluid was not consistent with SBP.   - Received IV Rocephin  for UTI.  -  Patient was initially treated with rifaximin  and lactulose  for encephalopathy.  At this time, only on lactulose  has been continued since rifaximin  will not be covered at the outpatient facility.   -11/24/2023: Paracentesis with removal of 3.6 L of fluid - Dr. Sonjia spoke with IR on 11/28/23 about potential peritoneal drain placement since she had it in the past.  Recommended peritoneal drain if going to SNF as patient would not be able to manage the drain on her own without assistance.  -Recent MELD of 35 with correlates to roughly 50% 3 month mortality. - 11/28:  paracentesis on 11/28 yielding 4.3L - 12/7: Paracentesis with  1.95 L removed - Patient is requiring paracentesis weekly, will place IR consult for peritoneal drain closer to discharge.  Low back pain with right-sided radiculopathy - Continues to complain of low back pain and right hip pain radiating to the leg to ankles.  - MRI of the L-spine showed abnormal bone marrow edema involving the right sacral ala concerning for fracture, recommended CT pelvis.  Moderate spinal canal stenosis at L4-L5 with crowding of cauda equina nerve roots.   - Ordered CT pelvis  - Continue pain control   Hepatic encephalopathy -Improved.   - Continue lactulose  to keep bowel movements 3-4 times a day.     Enterobacter UTI. Treated with Bactrim  DS twice daily and has completed the course   Hypokalemia, hypomagnesemia Replaced   Hypoalbuminemia Secondary to underlying liver disease.  Causing peripheral edema and recurrent ascites.   Thrombocytopenia Secondary to liver disease.   -Stable   Acute kidney injury Improved, likely  ATN from contrast.   Acute appendicitis General surgery was consulted and patient was thought to be high surgical risk so was treated conservatively with antibiotics.  No further abdominal pain.   Chronic hypotension Has received albumin  intermittently.  Currently on midodrine  20mg  3  times daily.    Chronic hyponatremia Patient's baseline appears to be around 125 -128.   -Continue to monitor   Acute blood loss anemia secondary to acute upper GI bleed from esophagitis and esophageal necrosis Patient was seen by GI during hospitalization - EGD on 10/04/2023 with findings of grade D esophagitis in addition to concern for acute esophageal necrosis. Non-bleeding gastric ulcer noted GI recommended Carafate  for 2 weeks and Protonix  twice daily with outpatient GI follow-up.  Patient received 4 units of packed RBC during this hospitalization.    - Please see need stable    Lower extremity edema, Peripheral neuropathy.  Gout -Edema likely  secondary to hypoalbuminemia from liver disease.   - Venous Doppler LE negative for DVT.    -x-ray of the right ankle 11/30 without any bony issues.  x-ray hip showed no evidence of hip fracture or dislocation - Continue gabapentin , Lasix  - uric acid level 7.4 on 11/13.  Received colchicine  0.6 mg daily, currently off colchicine .  -Prednisone  20 mg daily starting tomorrow for 3 days, then off.    Tobacco abuse Counseling done   History of alcohol  abuse No active issues.  Continue multivitamin and thiamine .  No signs of withdrawal.    Goals of care Plan for skilled nursing facility for rehabitation.  Difficult to place   Insomnia - Continue Seroquel    Fall - Pt found on ground in feces on the early morning of 11/30 -B hip and B ankle xrays reviewed, no acute fractures    RN Pressure Injury Documentation: Wound 10/15/23 0900 Pressure Injury Sacrum Mid Stage 2 -  Partial thickness loss of dermis presenting as a shallow open injury with a red, pink wound bed without slough. (Active)  Unclear if POA  Severe protein calorie malnutrition Nutrition Problem: Severe Malnutrition Etiology: chronic illness (cirrhosis) Signs/Symptoms: severe fat depletion, severe muscle depletion Interventions: Ensure Enlive (each supplement provides 350kcal and 20 grams of protein), MVI, Liberalize Diet, Magic cup  Estimated body mass index is 26.72 kg/m as calculated from the following:   Height as of this encounter: 5' 2 (1.575 m).   Weight as of this encounter: 66.3 kg.  Code Status: Full code DVT Prophylaxis:  SCDs Start: 10/03/23 2153   Level of Care: Level of care: Progressive Family Communication:  Disposition Plan:      Remains inpatient appropriate: Awaiting placement   Procedures:  Paracentesis  Consultants:     Antimicrobials:   Anti-infectives (From admission, onward)    Start     Dose/Rate Route Frequency Ordered Stop   12/06/23 1930  fluconazole  (DIFLUCAN ) tablet 150 mg         150 mg Oral  Once 12/06/23 1831 12/06/23 1905   11/21/23 1115  sulfamethoxazole -trimethoprim  (BACTRIM  DS) 800-160 MG per tablet 1 tablet        1 tablet Oral Every 12 hours 11/21/23 1024 11/23/23 2117   11/20/23 1400  cefTRIAXone  (ROCEPHIN ) 1 g in sodium chloride  0.9 % 100 mL IVPB  Status:  Discontinued        1 g 200 mL/hr over 30 Minutes Intravenous Every 24 hours 11/20/23 1245 11/21/23 1024   11/12/23 1145  rifaximin  (XIFAXAN ) tablet 550 mg  Status:  Discontinued        550 mg Oral 2 times daily 11/12/23 1058 11/22/23 1628   10/29/23 0000  rifaximin  (XIFAXAN ) 550 MG TABS tablet        550 mg Oral 2 times daily 10/29/23 1129     10/25/23  1415  rifaximin  (XIFAXAN ) tablet 550 mg  Status:  Discontinued        550 mg Oral 2 times daily 10/25/23 1315 11/10/23 1244   10/12/23 1000  metroNIDAZOLE  (FLAGYL ) tablet 500 mg        500 mg Oral Every 12 hours 10/12/23 0734 10/22/23 2125   10/12/23 0830  cefTRIAXone  (ROCEPHIN ) 2 g in sodium chloride  0.9 % 100 mL IVPB        2 g 200 mL/hr over 30 Minutes Intravenous Every 24 hours 10/12/23 0734 10/22/23 1636   10/07/23 1000  fluconazole  (DIFLUCAN ) tablet 100 mg  Status:  Discontinued        100 mg Oral Daily 10/07/23 0853 10/07/23 0855   10/07/23 1000  fluconazole  (DIFLUCAN ) tablet 150 mg        150 mg Oral Daily 10/07/23 0855 10/07/23 1014   10/04/23 1000  cefTRIAXone  (ROCEPHIN ) 2 g in sodium chloride  0.9 % 100 mL IVPB        2 g 200 mL/hr over 30 Minutes Intravenous Every 24 hours 10/03/23 2154 10/08/23 0854   10/03/23 2015  cefTRIAXone  (ROCEPHIN ) 1 g in sodium chloride  0.9 % 100 mL IVPB        1 g 200 mL/hr over 30 Minutes Intravenous  Once 10/03/23 2010 10/03/23 2212          Medications  sodium chloride    Intravenous Once   calcium  carbonate  1 tablet Oral TID WC   feeding supplement  237 mL Oral TID BM   folic acid   1 mg Oral Daily   furosemide   40 mg Oral BID   gabapentin   600 mg Oral TID   lactulose   20 g Oral TID    lidocaine   2 patch Transdermal Q24H   lidocaine  (PF)  10 mL Other Once   midodrine   20 mg Oral TID WC   multivitamin with minerals  1 tablet Oral Daily   nutrition supplement (JUVEN)  1 packet Oral BID BM   pantoprazole   40 mg Oral BID   QUEtiapine   25 mg Oral QHS   thiamine   100 mg Oral Daily   triamcinolone  0.1 % cream : eucerin   Topical Daily      Subjective:   Joan Wood was seen and examined today.  back pain, otherwise no acute complaints, abdomen still distended.  Status post paracentesis on 12/7.  No acute issues.  Objective:   Vitals:   12/17/23 0921 12/17/23 2002 12/18/23 0439 12/18/23 0751  BP: 131/82 135/78  125/81  Pulse: 81 92  92  Resp: 18 18  16   Temp: 97.9 F (36.6 C) 97.9 F (36.6 C)  98 F (36.7 C)  TempSrc: Oral Oral  Oral  SpO2: 100% 100%  100%  Weight:   66.3 kg   Height:        Intake/Output Summary (Last 24 hours) at 12/18/2023 1113 Last data filed at 12/18/2023 1002 Gross per 24 hour  Intake 1810 ml  Output --  Net 1810 ml     Wt Readings from Last 3 Encounters:  12/18/23 66.3 kg  09/17/23 51.7 kg  09/17/23 51.7 kg   Physical Exam General: Alert and oriented x 3, NAD Cardiovascular: S1 S2 clear, RRR.  Respiratory: CTAB, no wheezing Gastrointestinal: Soft, nontender,distended, NBS Ext: no pedal edema bilaterally Neuro: no new deficits Psych: Normal affect    Data Reviewed:  I have personally reviewed following labs    CBC Lab Results  Component Value Date  WBC 5.7 12/18/2023   RBC 2.66 (L) 12/18/2023   HGB 8.5 (L) 12/18/2023   HCT 24.6 (L) 12/18/2023   MCV 92.5 12/18/2023   MCH 32.0 12/18/2023   PLT 161 12/18/2023   MCHC 34.6 12/18/2023   RDW 15.1 12/18/2023   LYMPHSABS 1.3 10/31/2023   MONOABS 1.3 (H) 10/31/2023   EOSABS 0.1 10/31/2023   BASOSABS 0.0 10/31/2023     Last metabolic panel Lab Results  Component Value Date   NA 134 (L) 12/18/2023   K 3.3 (L) 12/18/2023   CL 99 12/18/2023   CO2 19 (L)  12/18/2023   BUN 23 (H) 12/18/2023   CREATININE 0.74 12/18/2023   GLUCOSE 77 12/18/2023   GFRNONAA >60 12/18/2023   CALCIUM  8.4 (L) 12/18/2023   PHOS 3.9 11/30/2023   PROT 6.7 12/18/2023   ALBUMIN  1.9 (L) 12/18/2023   LABGLOB 4.4 07/30/2023   AGRATIO 1.2 03/28/2022   BILITOT 1.2 12/18/2023   ALKPHOS 79 12/18/2023   AST 77 (H) 12/18/2023   ALT 38 12/18/2023   ANIONGAP 16 (H) 12/18/2023    CBG (last 3)  No results for input(s): GLUCAP in the last 72 hours.    Coagulation Profile: No results for input(s): INR, PROTIME in the last 168 hours.   Radiology Studies: I have personally reviewed the imaging studies  MR LUMBAR SPINE WO CONTRAST Result Date: 12/17/2023 EXAM: MRI LUMBAR SPINE 12/17/2023 03:31:09 PM TECHNIQUE: Multiplanar multisequence MRI of the lumbar spine was performed without the administration of intravenous contrast. COMPARISON: CT abdomen and pelvis 10/16/2023. CLINICAL HISTORY: Lumbar radiculopathy, symptoms persist with > 6 wks treatment. FINDINGS: BONES AND ALIGNMENT: Similar chronic deformity of the T12 superior endplate with mild anterior height loss. Grade 1 anterolisthesis of L4 on L5. Alignment is otherwise maintained. Bone marrow signal is unremarkable in the lumbar spine. Abnormal bone marrow edema involving the right sacral ala concerning for fracture. Consider CT of the pelvis for further evaluation of osseous anatomy at this level. Edema noted extending into the superior aspect of the S2 vertebral body. Mild edema along the S3 spinous process with associated flattening and remodeling likely related to chronic degenerative changes. No evidence of acute fracture in the lumbar spine. SPINAL CORD: The conus medullaris extends to the L1 level. SOFT TISSUES: No paraspinal mass. T12-L1: No significant spinal canal or foraminal stenosis. L1-L2: No significant spinal canal or foraminal stenosis. Mild facet arthrosis. L2-L3: Disc desiccation. Small disc bulge. Small  right foraminal disc protrusion. Mild facet arthrosis. No significant spinal canal stenosis or foraminal stenosis. L3-L4: Mild disc desiccation. Diffuse disc bulge. Mild to moderate bilateral facet arthrosis and thickening of the ligamentum flavum. No significant spinal canal stenosis. Mild foraminal stenosis on the right. L4-L5: Mild disc height loss. Anterolisthesis with partial uncovering of the disc. Diffuse disc bulge contributing to lateral recess narrowing. Moderate to severe facet arthrosis particularly on the left. Facet effusion noted on the right. Thickening of the ligamentum flavum. Moderate spinal canal stenosis with crowding of the cauda equina nerve roots noted. Mild right and moderate left foraminal stenosis. Disc bulge likely contacts the exiting left L4 nerve root. L5-S1: Small disc bulge. Left subarticular/foraminal annular fissure. Mild to moderate facet arthrosis. No significant spinal canal stenosis. No significant foraminal stenosis. IMPRESSION: 1. Abnormal bone marrow edema involving the right sacral ala concerning for fracture. Recommend CT of the pelvis for further evaluation. 2. Degenerative changes as above. Moderate spinal canal stenosis at L4-L5 with crowding of the cauda equina nerve roots. 3.  Mild right and moderate left foraminal stenosis at L4-L5, with disc bulge likely contacting the exiting left L4 nerve root. 4. Grade 1 anterolisthesis of L4 on L5. Electronically signed by: Donnice Mania MD 12/17/2023 04:33 PM EST RP Workstation: [id number]       Nydia Distance M.D. [facility] 12/18/2023, 11:14 AM  Available via Epic secure chat 7am-7pm After 7 pm, please refer to night coverage provider listed on amion.

## 2023-12-18 NOTE — Progress Notes (Signed)
 Occupational Therapy Treatment Patient Details Name: Joan Wood MRN: [id number] DOB: September 14, 1970 Today's Date: 12/18/2023   History of present illness 53 y.o. female admitted 10/03/2023 with nausea/vomiting and bloody diarrhea. Workup for decompensated alcoholic cirrhosis, ascites, hepatic encephalopathy. S/p upper EGD 9/25 concerning for possible acute esophageal necrosis, portal hypertension, non-bleeding gastric and duodenal ulcers. S/p recurrent paracentesis for recurrent ascites; IR deferred PleurX catheter placement due to social situation. PMH includes decompensated cirrhosis, PUD, GERD, pancreatitis, OSA, peripheral neuropathy, tobacco abuse, alcohol  abuse, severe protein malnutrition.   OT comments  Patient continues to demonstrate good gains with OT treatment with supervision for mobility, transfers and self care.  CGA while standing for LB dressing while pulling up clothing.  Patient will benefit from continued inpatient follow up therapy, <3 hours/day. Acute OT to continue to follow to address established goals to facilitate DC to next venue of care.        If plan is discharge home, recommend the following:  A little help with bathing/dressing/bathroom;Direct supervision/assist for medications management;Direct supervision/assist for financial management;Assist for transportation;Supervision due to cognitive status   Equipment Recommendations  BSC/3in1    Recommendations for Other Services      Precautions / Restrictions Precautions Precautions: Fall Recall of Precautions/Restrictions: Impaired Precaution/Restrictions Comments: hx of orthostasis Restrictions Weight Bearing Restrictions Per Provider Order: No       Mobility Bed Mobility Overal bed mobility: Independent             General bed mobility comments: OOB in bathroom upon entry and left on EOB with breakfast at end of session    Transfers Overall transfer level: Needs assistance Equipment used:  Rolling walker (2 wheels) Transfers: Sit to/from Stand Sit to Stand: Supervision           General transfer comment: supervision for safety     Balance Overall balance assessment: Needs assistance Sitting-balance support: No upper extremity supported, Feet supported Sitting balance-Leahy Scale: Good Sitting balance - Comments: EOB   Standing balance support: During functional activity, No upper extremity supported, Bilateral upper extremity supported Standing balance-Leahy Scale: Good Standing balance comment: able to stand at sink with no UE support, patient prefers RW for support during mobility                           ADL either performed or assessed with clinical judgement   ADL Overall ADL's : Needs assistance/impaired Eating/Feeding: Set up;Sitting Eating/Feeding Details (indicate cue type and reason): able to open containers Grooming: Wash/dry hands;Wash/dry face;Supervision/safety;Standing Grooming Details (indicate cue type and reason): at sink         Upper Body Dressing : Supervision/safety;Sitting Upper Body Dressing Details (indicate cue type and reason): gown to cover back Lower Body Dressing: Contact guard assist;Sit to/from stand Lower Body Dressing Details (indicate cue type and reason): donning paper scrub pants                    Extremity/Trunk Assessment              Vision       Perception     Praxis     Communication Communication Communication: No apparent difficulties   Cognition Arousal: Alert Behavior During Therapy: WFL for tasks assessed/performed Cognition: Cognition impaired     Awareness: Intellectual awareness impaired Memory impairment (select all impairments): Short-term memory, Working memory Attention impairment (select first level of impairment): Sustained attention Executive functioning impairment (select all impairments): Organization, Reasoning,  Problem solving, Initiation                    Following commands: Impaired Following commands impaired: Follows multi-step commands inconsistently      Cueing   Cueing Techniques: Verbal cues, Visual cues  Exercises      Shoulder Instructions       General Comments VSS on RA    Pertinent Vitals/ Pain       Pain Assessment Pain Assessment: Faces Faces Pain Scale: Hurts a little bit Pain Location: right hip Pain Descriptors / Indicators: Grimacing, Guarding Pain Intervention(s): Monitored during session, Repositioned  Home Living                                          Prior Functioning/Environment              Frequency  Min 1X/week        Progress Toward Goals  OT Goals(current goals can now be found in the care plan section)  Progress towards OT goals: Progressing toward goals  Acute Rehab OT Goals Patient Stated Goal: to go home OT Goal Formulation: With patient Time For Goal Achievement: 12/21/23 Potential to Achieve Goals: Fair ADL Goals Pt Will Perform Grooming: Independently;standing Pt Will Perform Lower Body Dressing: with modified independence;with adaptive equipment;sitting/lateral leans;sit to/from stand Pt Will Transfer to Toilet: with modified independence;ambulating;regular height toilet;grab bars Pt Will Perform Toileting - Clothing Manipulation and hygiene: with modified independence;sitting/lateral leans;sit to/from stand Pt/caregiver will Perform Home Exercise Program: Both right and left upper extremity;With written HEP provided;With theraband  Plan      Co-evaluation                 AM-PAC OT 6 Clicks Daily Activity     Outcome Measure   Help from another person eating meals?: None Help from another person taking care of personal grooming?: A Little Help from another person toileting, which includes using toliet, bedpan, or urinal?: A Little Help from another person bathing (including washing, rinsing, drying)?: A Little Help from another  person to put on and taking off regular upper body clothing?: A Little Help from another person to put on and taking off regular lower body clothing?: A Little 6 Click Score: 19    End of Session Equipment Utilized During Treatment: Gait belt;Rolling walker (2 wheels)  OT Visit Diagnosis: Other abnormalities of gait and mobility (R26.89);Muscle weakness (generalized) (M62.81);Pain;Other symptoms and signs involving cognitive function Pain - Right/Left: Right Pain - part of body: Hip   Activity Tolerance Patient tolerated treatment well   Patient Left in bed;with call bell/phone within reach;with bed alarm set;Other (comment) (seated on EOB)   Nurse Communication Mobility status        Time: 9285-9253 OT Time Calculation (min): 32 min  Charges: OT General Charges $OT Visit: 1 Visit OT Treatments $Self Care/Home Management : 8-22 mins $Therapeutic Activity: 8-22 mins  Dick Laine, OTA Acute Rehabilitation Services  Office [phone]   Jeb LITTIE Laine 12/18/2023, 8:01 AM

## 2023-12-19 NOTE — Progress Notes (Signed)
 PROGRESS NOTE  Joan Wood  DOB: 23-Apr-1970  PCP: Edman Meade PEDLAR, FNP FMW:[id number]  DOA: 10/03/2023  LOS: 77 days  Hospital Day: 78  Subjective: Patient was seen and examined this morning. Middle-aged Caucasian female.  Walking back from the bathroom to her bed.  Was wobbly when walking. She seems to be pretty concerned about the CT scan finding from yesterday showing ala fracture.  Brief narrative: Joan Wood is a 53 y.o. female with PMH significant for decompensated liver cirrhosis, PUD, gastric ulcer perforation s/p ex lap, GERD, pancreatitis, OSA  9/24, patient presented to the ED with coffee-ground emesis, bloody diarrhea.   EGD showed nonbleeding gastric ulcer, esophagitis and concern for possible esophageal necrosis.  Hospitalization was complicated by hepatic encephalopathy, treated with lactulose  and rifaximin .  Patient also required multiple paracentesis for her recurrent ascites.  Recent MELD of 35 with correlates to roughly 50% 3 month mortality. Difficult to place  Assessment and plan: Acute upper GI bleeding Acute blood loss anemia  EGD 10/04/2023 showed nonbleeding gastric ulcer, grade D esophagitis and esophageal necrosis GI recommended Carafate  for 2 weeks and Protonix  twice daily with outpatient GI follow-up.  Patient received 4 units of packed RBC during this hospitalization.    Hemoglobin stable between 8 and 9 in last several checks. Continue Protonix  40 mg twice daily Received IV Rocephin  earlier in the course for SBP prophylaxis Recent Labs    04/11/23 1455 05/11/23 1512 05/12/23 0930 05/12/23 0940 05/12/23 0943 05/13/23 0222 08/06/23 0419 08/07/23 0302 12/09/23 0417 12/10/23 0210 12/12/23 0509 12/15/23 0616 12/15/23 0617 12/18/23 0502  HGB 13.2   < >  --    < >  --    < > 7.8*   < > 8.3* 8.1* 8.2* 9.5*  --  8.5*  MCV 97   < >  --    < >  --    < > 92.8   < > 93.5 94.1 94.4 95.2  --  92.5  VITAMINB12  --   --  704  --   --   --  757   --   --   --   --   --  1,255*  --   FOLATE  --   --   --   --  13.6  --  6.5  --   --   --   --   --  >20.0  --   FERRITIN 165*  --  266  --   --   --   --   --   --   --   --  138  --   --   TIBC 373  --  317  --   --   --  310  --   --   --   --  318  --   --   IRON 73  --  119  --   --   --  8*  --   --   --   --  38  --   --   RETICCTPCT  --   --   --   --  3.0  --   --   --   --   --   --   --  2.8  --    < > = values in this interval not displayed.   Decompensated alcoholic cirrhosis  Hepatic encephalopathy  Patient was initially treated with rifaximin  and lactulose  for encephalopathy.  At this time, only on lactulose  has been continued since rifaximin  will not be covered at the outpatient facility.   Repeat labs with ammonia level tomorrow Recent Labs  Lab 12/15/23 0616 12/18/23 0502  AST 85* 77*  ALT 34 38  ALKPHOS 87 79  BILITOT 1.5* 1.2  PROT 7.6 6.7  ALBUMIN  2.3* 1.9*  PLT 172 161   Ascites requiring recurrent paracentesis Underwent therapeutic paracentesis on 11/12/2023 with removal of 750 cc of ascitic fluid.  Ascitic fluid was not consistent with SBP.   11/24/2023: Paracentesis with removal of 3.6 L of fluid 11/28:  paracentesis yielded 4.3L 12/7: Paracentesis with 1.95 L removed Previous hospitalist spoke with IR about potential peritoneal drain placement since she had it in the past.  Recommended peritoneal drain if going to SNF as patient would not be able to manage the drain on her own without assistance.  Needs IR consult for peritoneal drain closer to discharge.  Lower extremity edema Chronic hypotension Secondary to liver cirrhosis and portal hypertension Venous duplex rule out DVT Currently on oral Lasix  40 mg twice daily, midodrine  20 mg 3 times daily  Hypokalemia, hypomagnesemia Repeat electrolytes tomorrow Recent Labs  Lab 12/15/23 0616 12/18/23 0502  K 3.1* 3.3*   Chronic mild hyponatremia Hypovolemic hyponatremia secondary to decompensated  liver cirrhosis. Continue to monitor Recent Labs  Lab 12/15/23 0616 12/18/23 0502  NA 129* 134*   Enterobacter UTI Completed the course of antibiotics with Bactrim  DS twice daily   Acute appendicitis Noted on CT 10/2.   General surgery was consulted and patient was thought to be high surgical risk so was treated conservatively with antibiotics.  No further abdominal pain.   In-hospital fall Impaired mobility 11/30, pt was found on ground in feces Bilateral hip and ankle xrays did not show acute fractures. Seen by PT.  SNF recommended  Low back pain with right-sided radiculopathy Peripheral neuropathy Continues to complain of low back pain and right hip pain radiating to the leg to ankles.  12/8, MRI of the L-spine showed abnormal bone marrow edema involving the right sacral ala concerning for fracture, recommended CT pelvis.  Moderate spinal canal stenosis at L4-L5 with crowding of cauda equina nerve roots.   12/9, CT pelvis showed a nondisplaced right sacral ala fracture Pain regimen --- Scheduled: Neurontin  600 mg 3 times daily --- PRN: IV Dilaudid , oxycodone   Gout Had an acute gout flareup 11/22/2023.  Treated with colchicine  and prednisone . Nearing the end of prednisone  tapering course   Insomnia Continue Seroquel , melatonin nightly as needed   Chronic smoking  Counseling done to quit   Chronic alcohol  use No active issues.   Continue multivitamin and thiamine .   No signs of withdrawal in the hospital   Severe malnutrition  Nutrition Problem: Severe Malnutrition Etiology: chronic illness (cirrhosis) Signs/Symptoms: severe fat depletion, severe muscle depletion Interventions: Ensure Enlive (each supplement provides 350kcal and 20 grams of protein), MVI, Liberalize Diet, Magic cup   Goals of care   Code Status: Full Code     DVT prophylaxis:  SCDs Start: 10/03/23 2153   Antimicrobials: None currently Fluid: None Consultants: IR Family Communication:  None at bedside  Status: Inpatient Level of care:  Progressive   Patient is from: Home Needs to continue in-hospital care: Difficult to place Anticipated d/c to: [facility] when placement is found..  Also needs peritoneal drain placement prior to discharge      Diet:  Diet Order  Diet 2 gram sodium Room service appropriate? Yes with Assist; Fluid consistency: Thin; Fluid restriction: 1500 mL Fluid  Diet effective now                   Scheduled Meds:  sodium chloride    Intravenous Once   calcium  carbonate  1 tablet Oral TID WC   feeding supplement  237 mL Oral TID BM   folic acid   1 mg Oral Daily   furosemide   40 mg Oral BID   gabapentin   600 mg Oral TID   lactulose   20 g Oral TID   lidocaine   2 patch Transdermal Q24H   lidocaine  (PF)  10 mL Other Once   midodrine   20 mg Oral TID WC   multivitamin with minerals  1 tablet Oral Daily   nutrition supplement (JUVEN)  1 packet Oral BID BM   pantoprazole   40 mg Oral BID   predniSONE   20 mg Oral QAC breakfast   QUEtiapine   25 mg Oral QHS   thiamine   100 mg Oral Daily   triamcinolone  0.1 % cream : eucerin   Topical Daily    PRN meds: albuterol , alum & mag hydroxide-simeth, antiseptic oral rinse, artificial tears, HYDROmorphone  (DILAUDID ) injection, hydrOXYzine , melatonin, Muscle Rub, ondansetron , mouth rinse, oxyCODONE , phenol, prochlorperazine , simethicone , witch hazel-glycerin    Infusions:    Antimicrobials: Anti-infectives (From admission, onward)    Start     Dose/Rate Route Frequency Ordered Stop   12/06/23 1930  fluconazole  (DIFLUCAN ) tablet 150 mg        150 mg Oral  Once 12/06/23 1831 12/06/23 1905   11/21/23 1115  sulfamethoxazole -trimethoprim  (BACTRIM  DS) 800-160 MG per tablet 1 tablet        1 tablet Oral Every 12 hours 11/21/23 1024 11/23/23 2117   11/20/23 1400  cefTRIAXone  (ROCEPHIN ) 1 g in sodium chloride  0.9 % 100 mL IVPB  Status:  Discontinued        1 g 200 mL/hr over 30 Minutes  Intravenous Every 24 hours 11/20/23 1245 11/21/23 1024   11/12/23 1145  rifaximin  (XIFAXAN ) tablet 550 mg  Status:  Discontinued        550 mg Oral 2 times daily 11/12/23 1058 11/22/23 1628   10/29/23 0000  rifaximin  (XIFAXAN ) 550 MG TABS tablet        550 mg Oral 2 times daily 10/29/23 1129     10/25/23 1415  rifaximin  (XIFAXAN ) tablet 550 mg  Status:  Discontinued        550 mg Oral 2 times daily 10/25/23 1315 11/10/23 1244   10/12/23 1000  metroNIDAZOLE  (FLAGYL ) tablet 500 mg        500 mg Oral Every 12 hours 10/12/23 0734 10/22/23 2125   10/12/23 0830  cefTRIAXone  (ROCEPHIN ) 2 g in sodium chloride  0.9 % 100 mL IVPB        2 g 200 mL/hr over 30 Minutes Intravenous Every 24 hours 10/12/23 0734 10/22/23 1636   10/07/23 1000  fluconazole  (DIFLUCAN ) tablet 100 mg  Status:  Discontinued        100 mg Oral Daily 10/07/23 0853 10/07/23 0855   10/07/23 1000  fluconazole  (DIFLUCAN ) tablet 150 mg        150 mg Oral Daily 10/07/23 0855 10/07/23 1014   10/04/23 1000  cefTRIAXone  (ROCEPHIN ) 2 g in sodium chloride  0.9 % 100 mL IVPB        2 g 200 mL/hr over 30 Minutes Intravenous Every 24 hours 10/03/23 2154 10/08/23 0854  10/03/23 2015  cefTRIAXone  (ROCEPHIN ) 1 g in sodium chloride  0.9 % 100 mL IVPB        1 g 200 mL/hr over 30 Minutes Intravenous  Once 10/03/23 2010 10/03/23 2212       Objective: Vitals:   12/18/23 1943 12/19/23 0747  BP: 126/73 126/81  Pulse: 91 (!) 101  Resp: 18   Temp: 98.2 F (36.8 C) (!) 97.5 F (36.4 C)  SpO2: 98% 100%    Intake/Output Summary (Last 24 hours) at 12/19/2023 1149 Last data filed at 12/19/2023 0900 Gross per 24 hour  Intake 1462 ml  Output --  Net 1462 ml   Filed Weights   12/17/23 0600 12/18/23 0439 12/19/23 0500  Weight: (P) 59.6 kg 66.3 kg 59.6 kg   Weight change: -6.713 kg Body mass index is 24.02 kg/m.   Physical Exam: General exam: Pleasant, middle-aged Caucasian female Skin: No rashes, lesions or ulcers. HEENT: Atraumatic,  normocephalic, no obvious bleeding Lungs: Clear to auscultation bilaterally,  CVS: S1, S2, no murmur,   GI/Abd: Soft, nontender, distended because of ascites, bowel sound present,   CNS: Alert, awake, oriented x 3.  Somewhat slow to respond Psychiatry: Sad affect Extremities: trace pedal edema, no calf tenderness,   Data Review: I have personally reviewed the laboratory data and studies available.  F/u labs ordered. Unresulted Labs (From admission, onward)     Start     Ordered   12/20/23 0500  CBC with Differential/Platelet  Tomorrow morning,   R       Question:  Specimen collection method  Answer:  Lab=Lab collect   12/19/23 0833   12/20/23 0500  Comprehensive metabolic panel with GFR  Tomorrow morning,   R       Question:  Specimen collection method  Answer:  Lab=Lab collect   12/19/23 0833   12/20/23 0500  Ammonia  Tomorrow morning,   R       Question:  Specimen collection method  Answer:  Lab=Lab collect   12/19/23 0833   12/20/23 0500  Magnesium   Tomorrow morning,   R       Question:  Specimen collection method  Answer:  Lab=Lab collect   12/19/23 0834   12/20/23 0500  Phosphorus  Tomorrow morning,   R       Question:  Specimen collection method  Answer:  Lab=Lab collect   12/19/23 9165            Signed, Chapman Rota, MD [facility] 12/19/2023

## 2023-12-19 NOTE — Progress Notes (Signed)
 Patient took off her leg dressing, this RN attempted to reapply dressing for her leg, but patient refused, patient stated  my leg feels better without it. Patient educated on the importance of dressing for her leg but patient still refused.   Daril ORN, RN   12/19/23 2200

## 2023-12-19 NOTE — Progress Notes (Signed)
 Upon rounding, pt noted OOB independently with walker. RN review hospital procedure of safety precautions with patient. She states that she has been walking with her walker alone and no one has bothering her and every day, there is always some types of changes. Patient declined for bed alarms. Yellow socks applied. Room de-clustered to prevent fall.

## 2023-12-19 NOTE — Plan of Care (Signed)

## 2023-12-19 NOTE — Progress Notes (Signed)
 PIV appears infiltrated during flush, IV dilaudid  wasted, PIV removed. Patient request for IV tram to restart IV for pain medication administration.

## 2023-12-20 DIAGNOSIS — K7460 Unspecified cirrhosis of liver: Secondary | ICD-10-CM | POA: Diagnosis not present

## 2023-12-20 LAB — COMPREHENSIVE METABOLIC PANEL WITH GFR
ALT: 38 U/L (ref 0–44)
AST: 71 U/L — ABNORMAL HIGH (ref 15–41)
Albumin: 1.9 g/dL — ABNORMAL LOW (ref 3.5–5.0)
Alkaline Phosphatase: 73 U/L (ref 38–126)
Anion gap: 5 (ref 5–15)
BUN: 22 mg/dL — ABNORMAL HIGH (ref 6–20)
CO2: 28 mmol/L (ref 22–32)
Calcium: 7.9 mg/dL — ABNORMAL LOW (ref 8.9–10.3)
Chloride: 96 mmol/L — ABNORMAL LOW (ref 98–111)
Creatinine, Ser: 0.43 mg/dL — ABNORMAL LOW (ref 0.44–1.00)
GFR, Estimated: >60 mL/min (ref >60)
Glucose, Bld: 83 mg/dL (ref 70–99)
Potassium: 3.3 mmol/L — ABNORMAL LOW (ref 3.5–5.1)
Sodium: 129 mmol/L — ABNORMAL LOW (ref 135–145)
Total Bilirubin: 1.2 mg/dL (ref 0.0–1.2)
Total Protein: 6.3 g/dL — ABNORMAL LOW (ref 6.5–8.1)

## 2023-12-20 LAB — CBC WITH DIFFERENTIAL/PLATELET
Abs Immature Granulocytes: 0.02 K/uL (ref 0.00–0.07)
Basophils Absolute: 0 K/uL (ref 0.0–0.1)
Basophils Relative: 0 %
Eosinophils Absolute: 0.1 K/uL (ref 0.0–0.5)
Eosinophils Relative: 2 %
HCT: 24.8 % — ABNORMAL LOW (ref 36.0–46.0)
Hemoglobin: 8.4 g/dL — ABNORMAL LOW (ref 12.0–15.0)
Immature Granulocytes: 0 %
Lymphocytes Relative: 22 %
Lymphs Abs: 1.2 K/uL (ref 0.7–4.0)
MCH: 32.1 pg (ref 26.0–34.0)
MCHC: 33.9 g/dL (ref 30.0–36.0)
MCV: 94.7 fL (ref 80.0–100.0)
Monocytes Absolute: 1.1 K/uL — ABNORMAL HIGH (ref 0.1–1.0)
Monocytes Relative: 21 %
Neutro Abs: 3 K/uL (ref 1.7–7.7)
Neutrophils Relative %: 55 %
Platelets: 139 K/uL — ABNORMAL LOW (ref 150–400)
RBC: 2.62 MIL/uL — ABNORMAL LOW (ref 3.87–5.11)
RDW: 14.8 % (ref 11.5–15.5)
WBC: 5.5 K/uL (ref 4.0–10.5)
nRBC: 0 % (ref 0.0–0.2)

## 2023-12-20 LAB — MAGNESIUM: Magnesium: 1.7 mg/dL (ref 1.7–2.4)

## 2023-12-20 LAB — AMMONIA: Ammonia: 59 umol/L — ABNORMAL HIGH (ref 9–35)

## 2023-12-20 LAB — PHOSPHORUS: Phosphorus: 4.2 mg/dL (ref 2.5–4.6)

## 2023-12-20 MED ORDER — LACTULOSE 10 GM/15ML PO SOLN
30.0000 g | Freq: Three times a day (TID) | ORAL | Status: AC
  Administered 2023-12-20 – 2024-02-04 (×122): 30 g via ORAL
  Administered 2024-02-05: 20 g via ORAL
  Administered 2024-02-06 – 2024-02-15 (×25): 30 g via ORAL
  Filled 2023-12-20 (×141): qty 60

## 2023-12-20 MED ORDER — POTASSIUM CHLORIDE CRYS ER 20 MEQ PO TBCR
40.0000 meq | EXTENDED_RELEASE_TABLET | Freq: Once | ORAL | Status: AC
  Administered 2023-12-20: 40 meq via ORAL
  Filled 2023-12-20: qty 2

## 2023-12-20 NOTE — Progress Notes (Signed)
 PROGRESS NOTE  Joan Wood  DOB: 17-Oct-1970  PCP: Edman Meade PEDLAR, FNP FMW:[id number]  DOA: 10/03/2023  LOS: 78 days  Hospital Day: 79  Subjective: Patient was seen and examined this morning. Lying on bed.  Not in distress. Afebrile, hemodynamically stable with blood pressure in 120s, heart rate in 100s, breathing on room air Labs this morning with sodium low at 129, potassium low at 3.3, hemoglobin stable at 8.4  Brief narrative: Joan Wood is a 53 y.o. female with PMH significant for decompensated liver cirrhosis, PUD, gastric ulcer perforation s/p ex lap, GERD, pancreatitis, OSA  9/24, patient presented to the ED with coffee-ground emesis, bloody diarrhea.   EGD showed nonbleeding gastric ulcer, esophagitis and concern for possible esophageal necrosis.  Hospitalization was complicated by hepatic encephalopathy, treated with lactulose  and rifaximin .  Patient also required multiple paracentesis for her recurrent ascites.  Recent MELD of 35 with correlates to roughly 50% 3 month mortality. Difficult to place  Assessment and plan: Acute upper GI bleeding Acute blood loss anemia  EGD 10/04/2023 showed nonbleeding gastric ulcer, grade D esophagitis and esophageal necrosis GI recommended Carafate  for 2 weeks and Protonix  twice daily with outpatient GI follow-up.  Patient received 4 units of packed RBC during this hospitalization.    Hemoglobin stable between 8 and 9 in last several checks. Continue Protonix  40 mg twice daily Received IV Rocephin  earlier in the course for SBP prophylaxis Recent Labs    04/11/23 1455 05/11/23 1512 05/12/23 0930 05/12/23 0940 05/12/23 0943 05/13/23 0222 08/06/23 0419 08/07/23 0302 12/10/23 0210 12/12/23 0509 12/15/23 0616 12/15/23 0617 12/18/23 0502 12/20/23 0306  HGB 13.2   < >  --    < >  --    < > 7.8*   < > 8.1* 8.2* 9.5*  --  8.5* 8.4*  MCV 97   < >  --    < >  --    < > 92.8   < > 94.1 94.4 95.2  --  92.5 94.7  VITAMINB12  --    --  704  --   --   --  757  --   --   --   --  1,255*  --   --   FOLATE  --   --   --   --  13.6  --  6.5  --   --   --   --  >20.0  --   --   FERRITIN 165*  --  266  --   --   --   --   --   --   --  138  --   --   --   TIBC 373  --  317  --   --   --  310  --   --   --  318  --   --   --   IRON 73  --  119  --   --   --  8*  --   --   --  38  --   --   --   RETICCTPCT  --   --   --   --  3.0  --   --   --   --   --   --  2.8  --   --    < > = values in this interval not displayed.   Decompensated alcoholic cirrhosis  Hepatic encephalopathy  Patient was initially treated with rifaximin   and lactulose  for encephalopathy.   At this time, only on lactulose  has been continued since rifaximin  will not be covered at the outpatient facility.   Ammonia level elevated at 59 today.  Currently on lactulose  20 g 3 times daily.  It seems she is only having 1 bowel movement a day.  I will increase lactulose  to 30 g 3 times daily.  Continue to monitor. Recent Labs  Lab 12/15/23 0616 12/18/23 0502 12/20/23 0306  AST 85* 77* 71*  ALT 34 38 38  ALKPHOS 87 79 73  BILITOT 1.5* 1.2 1.2  PROT 7.6 6.7 6.3*  ALBUMIN  2.3* 1.9* 1.9*  AMMONIA  --   --  59*  PLT 172 161 139*   Ascites requiring recurrent paracentesis Underwent therapeutic paracentesis on 11/12/2023 with removal of 750 cc of ascitic fluid.  Ascitic fluid was not consistent with SBP.   11/24/2023: Paracentesis with removal of 3.6 L of fluid 11/28:  paracentesis yielded 4.3L 12/7: Paracentesis with 1.95 L removed Previous hospitalist spoke with IR about potential peritoneal drain placement since she had it in the past.  Recommended peritoneal drain if going to SNF as patient would not be able to manage the drain on her own without assistance.  Needs IR consult for peritoneal drain closer to discharge.  Lower extremity edema Chronic hypotension Secondary to liver cirrhosis and portal hypertension Venous duplex rule out DVT Currently on oral  Lasix  40 mg twice daily, midodrine  20 mg 3 times daily  Hypokalemia, hypomagnesemia Labs today showed potassium low at 3.3.  Replacement given. Recent Labs  Lab 12/15/23 0616 12/18/23 0502 12/20/23 0306  K 3.1* 3.3* 3.3*  MG  --   --  1.7  PHOS  --   --  4.2   Chronic mild hyponatremia Hypovolemic hyponatremia secondary to decompensated liver cirrhosis. Continue to monitor Recent Labs  Lab 12/15/23 0616 12/18/23 0502 12/20/23 0306  NA 129* 134* 129*   Enterobacter UTI Completed the course of antibiotics with Bactrim  DS twice daily   Acute appendicitis Noted on CT 10/2.   General surgery was consulted and patient was thought to be high surgical risk so was treated conservatively with antibiotics.  No further abdominal pain.   In-hospital fall Impaired mobility 11/30, pt was found on ground in feces Bilateral hip and ankle xrays did not show acute fractures. Seen by PT.  SNF recommended  Low back pain with right-sided radiculopathy Peripheral neuropathy Continues to complain of low back pain and right hip pain radiating to the leg to ankles.  12/8, MRI of the L-spine showed abnormal bone marrow edema involving the right sacral ala concerning for fracture, recommended CT pelvis.  Moderate spinal canal stenosis at L4-L5 with crowding of cauda equina nerve roots.   12/9, CT pelvis showed a nondisplaced right sacral ala fracture Pain regimen --- Scheduled: Neurontin  600 mg 3 times daily --- PRN: IV Dilaudid , oxycodone   Gout Had an acute gout flareup 11/22/2023.  Treated with colchicine  and prednisone . Nearing the end of prednisone  tapering course   Insomnia Continue Seroquel , melatonin nightly as needed   Chronic smoking  Counseling done to quit   Chronic alcohol  use No active issues.   Continue multivitamin and thiamine .   No signs of withdrawal in the hospital   Severe malnutrition  Nutrition Problem: Severe Malnutrition Etiology: chronic illness  (cirrhosis) Signs/Symptoms: severe fat depletion, severe muscle depletion Interventions: Ensure Enlive (each supplement provides 350kcal and 20 grams of protein), MVI, Liberalize Diet, Magic cup  Goals of care   Code Status: Full Code     DVT prophylaxis:  SCDs Start: 10/03/23 2153   Antimicrobials: None currently Fluid: None Consultants: IR Family Communication: None at bedside  Status: Inpatient Level of care:  Progressive   Patient is from: Home Needs to continue in-hospital care: Difficult to place Anticipated d/c to: [facility] when placement is found..  Also needs peritoneal drain placement prior to discharge      Diet:  Diet Order             Diet 2 gram sodium Room service appropriate? Yes with Assist; Fluid consistency: Thin; Fluid restriction: 1500 mL Fluid  Diet effective now                   Scheduled Meds:  sodium chloride    Intravenous Once   calcium  carbonate  1 tablet Oral TID WC   feeding supplement  237 mL Oral TID BM   folic acid   1 mg Oral Daily   furosemide   40 mg Oral BID   gabapentin   600 mg Oral TID   lactulose   30 g Oral TID   lidocaine   2 patch Transdermal Q24H   lidocaine  (PF)  10 mL Other Once   midodrine   20 mg Oral TID WC   multivitamin with minerals  1 tablet Oral Daily   nutrition supplement (JUVEN)  1 packet Oral BID BM   pantoprazole   40 mg Oral BID   predniSONE   20 mg Oral QAC breakfast   QUEtiapine   25 mg Oral QHS   thiamine   100 mg Oral Daily   triamcinolone  0.1 % cream : eucerin   Topical Daily    PRN meds: albuterol , alum & mag hydroxide-simeth, antiseptic oral rinse, artificial tears, HYDROmorphone  (DILAUDID ) injection, hydrOXYzine , melatonin, Muscle Rub, ondansetron , mouth rinse, oxyCODONE , phenol, prochlorperazine , simethicone , witch hazel-glycerin    Infusions:    Antimicrobials: Anti-infectives (From admission, onward)    Start     Dose/Rate Route Frequency Ordered Stop   12/06/23 1930   fluconazole  (DIFLUCAN ) tablet 150 mg        150 mg Oral  Once 12/06/23 1831 12/06/23 1905   11/21/23 1115  sulfamethoxazole -trimethoprim  (BACTRIM  DS) 800-160 MG per tablet 1 tablet        1 tablet Oral Every 12 hours 11/21/23 1024 11/23/23 2117   11/20/23 1400  cefTRIAXone  (ROCEPHIN ) 1 g in sodium chloride  0.9 % 100 mL IVPB  Status:  Discontinued        1 g 200 mL/hr over 30 Minutes Intravenous Every 24 hours 11/20/23 1245 11/21/23 1024   11/12/23 1145  rifaximin  (XIFAXAN ) tablet 550 mg  Status:  Discontinued        550 mg Oral 2 times daily 11/12/23 1058 11/22/23 1628   10/29/23 0000  rifaximin  (XIFAXAN ) 550 MG TABS tablet        550 mg Oral 2 times daily 10/29/23 1129     10/25/23 1415  rifaximin  (XIFAXAN ) tablet 550 mg  Status:  Discontinued        550 mg Oral 2 times daily 10/25/23 1315 11/10/23 1244   10/12/23 1000  metroNIDAZOLE  (FLAGYL ) tablet 500 mg        500 mg Oral Every 12 hours 10/12/23 0734 10/22/23 2125   10/12/23 0830  cefTRIAXone  (ROCEPHIN ) 2 g in sodium chloride  0.9 % 100 mL IVPB        2 g 200 mL/hr over 30 Minutes Intravenous Every 24 hours 10/12/23 0734 10/22/23  1636   10/07/23 1000  fluconazole  (DIFLUCAN ) tablet 100 mg  Status:  Discontinued        100 mg Oral Daily 10/07/23 0853 10/07/23 0855   10/07/23 1000  fluconazole  (DIFLUCAN ) tablet 150 mg        150 mg Oral Daily 10/07/23 0855 10/07/23 1014   10/04/23 1000  cefTRIAXone  (ROCEPHIN ) 2 g in sodium chloride  0.9 % 100 mL IVPB        2 g 200 mL/hr over 30 Minutes Intravenous Every 24 hours 10/03/23 2154 10/08/23 0854   10/03/23 2015  cefTRIAXone  (ROCEPHIN ) 1 g in sodium chloride  0.9 % 100 mL IVPB        1 g 200 mL/hr over 30 Minutes Intravenous  Once 10/03/23 2010 10/03/23 2212       Objective: Vitals:   12/20/23 0754 12/20/23 1134  BP: 127/75 120/78  Pulse: (!) 106 (!) 104  Resp: 20 18  Temp: 98.8 F (37.1 C) 99.1 F (37.3 C)  SpO2: 100% 100%    Intake/Output Summary (Last 24 hours) at 12/20/2023  1340 Last data filed at 12/20/2023 0800 Gross per 24 hour  Intake 730 ml  Output --  Net 730 ml   Filed Weights   12/17/23 0600 12/18/23 0439 12/19/23 0500  Weight: (P) 59.6 kg 66.3 kg 59.6 kg   Weight change:  Body mass index is 24.02 kg/m.   Physical Exam: General exam: Pleasant, middle-aged Caucasian female Skin: No rashes, lesions or ulcers. HEENT: Atraumatic, normocephalic, no obvious bleeding Lungs: Clear to auscultation bilaterally,  CVS: S1, S2, no murmur,   GI/Abd: Soft, nontender, distended because of ascites, bowel sound present,   CNS: Somnolent.  Opens eyes on command.  Oriented x 3.  Not in distress.   Psychiatry: Sad affect Extremities: trace pedal edema, no calf tenderness,   Data Review: I have personally reviewed the laboratory data and studies available.  F/u labs ordered. Unresulted Labs (From admission, onward)    None       Signed, Chapman Rota, MD [facility] 12/20/2023

## 2023-12-20 NOTE — Plan of Care (Signed)
 Was emotional and restless due to complain of pain in her low back, PRN ,medication was administered. Emotional support, diversion activities, back rub and back massage was given.  Problem: Health Behavior/Discharge Planning: Goal: Ability to manage health-related needs will improve Outcome: Progressing   Problem: Clinical Measurements: Goal: Ability to maintain clinical measurements within normal limits will improve Outcome: Progressing   Problem: Clinical Measurements: Goal: Will remain free from infection Outcome: Progressing   Problem: Activity: Goal: Risk for activity intolerance will decrease Outcome: Progressing   Problem: Nutrition: Goal: Adequate nutrition will be maintained Outcome: Progressing   Problem: Elimination: Goal: Will not experience complications related to bowel motility Outcome: Progressing   Problem: Elimination: Goal: Will not experience complications related to urinary retention Outcome: Progressing   Problem: Pain Managment: Goal: General experience of comfort will improve and/or be controlled Outcome: Progressing   Problem: Safety: Goal: Ability to remain free from injury will improve Outcome: Progressing   Problem: Skin Integrity: Goal: Risk for impaired skin integrity will decrease Outcome: Progressing

## 2023-12-20 NOTE — Plan of Care (Signed)

## 2023-12-21 ENCOUNTER — Inpatient Hospital Stay (HOSPITAL_COMMUNITY)

## 2023-12-21 DIAGNOSIS — R188 Other ascites: Secondary | ICD-10-CM | POA: Diagnosis not present

## 2023-12-21 DIAGNOSIS — K7290 Hepatic failure, unspecified without coma: Secondary | ICD-10-CM | POA: Diagnosis not present

## 2023-12-21 MED ORDER — OXYCODONE HCL 5 MG PO TABS
5.0000 mg | ORAL_TABLET | Freq: Three times a day (TID) | ORAL | Status: DC
  Administered 2023-12-21 – 2023-12-31 (×28): 5 mg via ORAL
  Filled 2023-12-21 (×28): qty 1

## 2023-12-21 MED ORDER — OXYCODONE HCL 5 MG PO TABS: 10.0000 mg | ORAL_TABLET | ORAL | Status: DC | PRN

## 2023-12-21 MED ORDER — HYDROMORPHONE HCL 1 MG/ML IJ SOLN
1.0000 mg | INTRAMUSCULAR | Status: DC | PRN
  Administered 2023-12-22 – 2024-01-04 (×45): 1 mg via INTRAVENOUS
  Filled 2023-12-21 (×45): qty 1

## 2023-12-21 NOTE — Progress Notes (Signed)
 PROGRESS NOTE  Joan Wood  DOB: [birth year]  PCP: Edman Meade PEDLAR, FNP FMW:[id number]  DOA: 10/03/2023  LOS: 79 days  Hospital Day: 80  Subjective: Patient was seen and examined this morning. Was tearful due to pain.  Felt better after pain medicine was given  Afebrile, hemoglobin stable with heart rate mostly 90s and low 100s, breathing on room air   Brief narrative: Joan Wood is a 53 y.o. female with PMH significant for decompensated liver cirrhosis, PUD, gastric ulcer perforation s/p ex lap, GERD, pancreatitis, OSA  9/24, patient presented to the ED with coffee-ground emesis, bloody diarrhea.   EGD showed nonbleeding gastric ulcer, esophagitis and concern for possible esophageal necrosis.  Hospitalization was complicated by hepatic encephalopathy, treated with lactulose  and rifaximin .  Patient also required multiple paracentesis for her recurrent ascites.  Recent MELD of 35 with correlates to roughly 50% 3 month mortality. Difficult to place  Assessment and plan: Acute upper GI bleeding Acute blood loss anemia  EGD 10/04/2023 showed nonbleeding gastric ulcer, grade D esophagitis and esophageal necrosis GI recommended Carafate  for 2 weeks and Protonix  twice daily with outpatient GI follow-up.  Patient received 4 units of packed RBC during this hospitalization.    Hemoglobin stable between 8 and 9 in last several checks. Continue Protonix  40 mg twice daily Received IV Rocephin  earlier in the course for SBP prophylaxis Recent Labs    04/11/23 1455 05/11/23 1512 05/12/23 0930 05/12/23 0940 05/12/23 0943 05/13/23 0222 08/06/23 0419 08/07/23 0302 12/10/23 0210 12/12/23 0509 12/15/23 0616 12/15/23 0617 12/18/23 0502 12/20/23 0306  HGB 13.2   < >  --    < >  --    < > 7.8*   < > 8.1* 8.2* 9.5*  --  8.5* 8.4*  MCV 97   < >  --    < >  --    < > 92.8   < > 94.1 94.4 95.2  --  92.5 94.7  VITAMINB12  --   --  704  --   --   --  757  --   --   --   --  1,255*  --    --   FOLATE  --   --   --   --  13.6  --  6.5  --   --   --   --  >20.0  --   --   FERRITIN 165*  --  266  --   --   --   --   --   --   --  138  --   --   --   TIBC 373  --  317  --   --   --  310  --   --   --  318  --   --   --   IRON 73  --  119  --   --   --  8*  --   --   --  38  --   --   --   RETICCTPCT  --   --   --   --  3.0  --   --   --   --   --   --  2.8  --   --    < > = values in this interval not displayed.   Decompensated alcoholic cirrhosis  Hepatic encephalopathy  Patient was initially treated with rifaximin  and lactulose  for encephalopathy.   At this time,  only on lactulose  has been continued since rifaximin  will not be covered at the outpatient facility.   Ammonia level elevated at 59 today.  Currently on lactulose  30 g 3 times daily. Continue to monitor. Recent Labs  Lab 12/15/23 0616 12/18/23 0502 12/20/23 0306  AST 85* 77* 71*  ALT 34 38 38  ALKPHOS 87 79 73  BILITOT 1.5* 1.2 1.2  PROT 7.6 6.7 6.3*  ALBUMIN  2.3* 1.9* 1.9*  AMMONIA  --   --  59*  PLT 172 161 139*   Ascites requiring recurrent paracentesis Underwent therapeutic paracentesis on 11/12/2023 with removal of 750 cc of ascitic fluid.  Ascitic fluid was not consistent with SBP.   11/24/2023: Paracentesis with removal of 3.6 L of fluid 11/28: paracentesis yielded 4.3L 12/7: Paracentesis with 1.95 L removed 12/11, abdomen was noted to be distended.  Ultrasound paracentesis was requested but apparently she was only noted to have very small pocket of fluid Previous hospitalist spoke with IR about potential peritoneal drain placement since she had it in the past.  Recommended peritoneal drain if going to SNF as patient would not be able to manage the drain on her own without assistance.  Needs IR consult for peritoneal drain closer to discharge.  Lower extremity edema Chronic hypotension Secondary to liver cirrhosis and portal hypertension Venous duplex rule out DVT Currently on oral Lasix  40 mg  twice daily, midodrine  20 mg 3 times daily  Hypokalemia, hypomagnesemia Labs today showed potassium low at 3.3.  Replacements given. Recent Labs  Lab 12/15/23 0616 12/18/23 0502 12/20/23 0306  K 3.1* 3.3* 3.3*  MG  --   --  1.7  PHOS  --   --  4.2   Chronic mild hyponatremia Hypovolemic hyponatremia secondary to decompensated liver cirrhosis. Continue to monitor Recent Labs  Lab 12/15/23 0616 12/18/23 0502 12/20/23 0306  NA 129* 134* 129*   Enterobacter UTI Completed the course of antibiotics with Bactrim  DS twice daily   Acute appendicitis Noted on CT 10/2.   General surgery was consulted and patient was thought to be high surgical risk so was treated conservatively with antibiotics.  No further abdominal pain.   In-hospital fall Impaired mobility 11/30, pt was found on ground in feces Bilateral hip and ankle xrays did not show acute fractures. Seen by PT.  SNF recommended  Low back pain with right-sided radiculopathy Peripheral neuropathy Continues to complain of low back pain and right hip pain radiating to the leg to ankles.  12/8, MRI of the L-spine showed abnormal bone marrow edema involving the right sacral ala concerning for fracture, recommended CT pelvis.  Moderate spinal canal stenosis at L4-L5 with crowding of cauda equina nerve roots.   12/9, CT pelvis showed a nondisplaced right sacral ala fracture Pain regimen --- Scheduled: Neurontin  600 mg 3 times daily.  Because of her significant state of pain today, I will schedule her on oxycodone  5 mg every 8 hours. --- PRN: IV Dilaudid ,   Gout Had an acute gout flareup 11/22/2023.  Treated with colchicine  and prednisone . Nearing the end of prednisone  tapering course   Insomnia Continue Seroquel , melatonin nightly as needed   Chronic smoking  Counseling done to quit   Chronic alcohol  use No active issues.   Continue multivitamin and thiamine .   No signs of withdrawal in the hospital   Severe  malnutrition  Nutrition Problem: Severe Malnutrition Etiology: chronic illness (cirrhosis) Signs/Symptoms: severe fat depletion, severe muscle depletion Interventions: Ensure Enlive (each supplement provides 350kcal and  20 grams of protein), MVI, Liberalize Diet, Magic cup   Goals of care   Code Status: Full Code     DVT prophylaxis:  SCDs Start: 10/03/23 2153   Antimicrobials: None currently Fluid: None Consultants: IR Family Communication: None at bedside  Status: Inpatient Level of care:  Progressive   Patient is from: Home Needs to continue in-hospital care: Difficult to place Anticipated d/c to: Ultimately SNF when placement is found..  Also needs peritoneal drain placement prior to discharge      Diet:  Diet Order             Diet 2 gram sodium Room service appropriate? Yes with Assist; Fluid consistency: Thin; Fluid restriction: 1500 mL Fluid  Diet effective now                   Scheduled Meds:  sodium chloride    Intravenous Once   calcium  carbonate  1 tablet Oral TID WC   feeding supplement  237 mL Oral TID BM   folic acid   1 mg Oral Daily   furosemide   40 mg Oral BID   gabapentin   600 mg Oral TID   lactulose   30 g Oral TID   lidocaine   2 patch Transdermal Q24H   lidocaine  (PF)  10 mL Other Once   midodrine   20 mg Oral TID WC   multivitamin with minerals  1 tablet Oral Daily   nutrition supplement (JUVEN)  1 packet Oral BID BM   oxyCODONE   5 mg Oral Q8H   pantoprazole   40 mg Oral BID   QUEtiapine   25 mg Oral QHS   thiamine   100 mg Oral Daily   triamcinolone  0.1 % cream : eucerin   Topical Daily    PRN meds: albuterol , alum & mag hydroxide-simeth, antiseptic oral rinse, artificial tears, HYDROmorphone  (DILAUDID ) injection, hydrOXYzine , melatonin, Muscle Rub, ondansetron , mouth rinse, phenol, prochlorperazine , simethicone , witch hazel-glycerin    Infusions:    Antimicrobials: Anti-infectives (From admission, onward)    Start     Dose/Rate  Route Frequency Ordered Stop   12/06/23 1930  fluconazole  (DIFLUCAN ) tablet 150 mg        150 mg Oral  Once 12/06/23 1831 12/06/23 1905   11/21/23 1115  sulfamethoxazole -trimethoprim  (BACTRIM  DS) 800-160 MG per tablet 1 tablet        1 tablet Oral Every 12 hours 11/21/23 1024 11/23/23 2117   11/20/23 1400  cefTRIAXone  (ROCEPHIN ) 1 g in sodium chloride  0.9 % 100 mL IVPB  Status:  Discontinued        1 g 200 mL/hr over 30 Minutes Intravenous Every 24 hours 11/20/23 1245 11/21/23 1024   11/12/23 1145  rifaximin  (XIFAXAN ) tablet 550 mg  Status:  Discontinued        550 mg Oral 2 times daily 11/12/23 1058 11/22/23 1628   10/29/23 0000  rifaximin  (XIFAXAN ) 550 MG TABS tablet        550 mg Oral 2 times daily 10/29/23 1129     10/25/23 1415  rifaximin  (XIFAXAN ) tablet 550 mg  Status:  Discontinued        550 mg Oral 2 times daily 10/25/23 1315 11/10/23 1244   10/12/23 1000  metroNIDAZOLE  (FLAGYL ) tablet 500 mg        500 mg Oral Every 12 hours 10/12/23 0734 10/22/23 2125   10/12/23 0830  cefTRIAXone  (ROCEPHIN ) 2 g in sodium chloride  0.9 % 100 mL IVPB        2 g 200 mL/hr over  30 Minutes Intravenous Every 24 hours 10/12/23 0734 10/22/23 1636   10/07/23 1000  fluconazole  (DIFLUCAN ) tablet 100 mg  Status:  Discontinued        100 mg Oral Daily 10/07/23 0853 10/07/23 0855   10/07/23 1000  fluconazole  (DIFLUCAN ) tablet 150 mg        150 mg Oral Daily 10/07/23 0855 10/07/23 1014   10/04/23 1000  cefTRIAXone  (ROCEPHIN ) 2 g in sodium chloride  0.9 % 100 mL IVPB        2 g 200 mL/hr over 30 Minutes Intravenous Every 24 hours 10/03/23 2154 10/08/23 0854   10/03/23 2015  cefTRIAXone  (ROCEPHIN ) 1 g in sodium chloride  0.9 % 100 mL IVPB        1 g 200 mL/hr over 30 Minutes Intravenous  Once 10/03/23 2010 10/03/23 2212       Objective: Vitals:   12/21/23 1539 12/21/23 1555  BP: (!) 92/56 102/70  Pulse: 96   Resp: 15   Temp: 99.3 F (37.4 C)   SpO2: 99%     Intake/Output Summary (Last 24 hours) at  12/21/2023 1718 Last data filed at 12/21/2023 1200 Gross per 24 hour  Intake 120 ml  Output 120 ml  Net 0 ml   Filed Weights   12/17/23 0600 12/18/23 0439 12/19/23 0500  Weight: (P) 59.6 kg 66.3 kg 59.6 kg   Weight change:  Body mass index is 24.02 kg/m.   Physical Exam: General exam: Pleasant, middle-aged Caucasian female Skin: No rashes, lesions or ulcers. HEENT: Atraumatic, normocephalic, no obvious bleeding Lungs: Clear to auscultation bilaterally,  CVS: S1, S2, no murmur,   GI/Abd: Soft, nontender, looks distended, bowel sound present,   CNS: Alert, awake, tearful in pain.  Later she was resting comfortably. Psychiatry: Sad affect Extremities: trace pedal edema, no calf tenderness,   Data Review: I have personally reviewed the laboratory data and studies available.  F/u labs ordered. Unresulted Labs (From admission, onward)    None       Signed, Chapman Rota, MD [facility] 12/21/2023

## 2023-12-21 NOTE — TOC Progression Note (Signed)
 Transition of Care [facility]) - Progression Note    Patient Details  Name: MALYNA BUDNEY MRN: [id number] Date of Birth: [birth year]  Transition of [facility] CM/SW Contact  [name], [name] Phone Number: 12/21/2023, 3:46 PM  Clinical Narrative:   Patient continues with no bed offers for SNF at this time. Referral faxed out again. CSW to follow.    Expected Discharge Plan: Skilled Nursing Facility Barriers to Discharge: Homeless with medical needs, Inadequate or no insurance, Continued Medical Work up, Unsafe home situation               Expected Discharge Plan and Services In-house Referral: Clinical Social Work Discharge Planning Services: CM Consult Post Acute Care Choice: Home Health Living arrangements for the past 2 months: Single Family Home                 DME Arranged: Walker rolling   Date DME Agency Contacted: 10/08/23   Representative spoke with at DME Agency: [name]             Social Drivers of Health (SDOH) Interventions SDOH Screenings   Food Insecurity: No Food Insecurity (10/03/2023)  Housing: Low Risk (10/03/2023)  Transportation Needs: No Transportation Needs (10/03/2023)  Recent Concern: Transportation Needs - Unmet Transportation Needs (08/20/2023)  Utilities: Not At Risk (10/03/2023)  Depression (PHQ2-9): High Risk (07/30/2023)  Social Connections: Socially Isolated (10/03/2023)  Tobacco Use: High Risk (10/04/2023)    Readmission Risk Interventions    10/04/2023    9:02 AM 09/03/2023   12:31 PM 09/02/2023    9:01 AM  Readmission Risk Prevention Plan  Transportation Screening Complete Complete Complete  PCP or Specialist Appt within 3-5 Days   Complete  HRI or Home Care Consult   Complete  Social Work Consult for Recovery Care Planning/Counseling   Complete  Palliative Care Screening   Not Applicable  Medication Review Oceanographer) Complete Complete Complete  HRI or Home Care Consult Complete Complete   SW Recovery  Care/Counseling Consult Complete Complete   Palliative Care Screening Not Applicable Not Applicable   Skilled Nursing Facility Not Applicable Patient Refused

## 2023-12-21 NOTE — Progress Notes (Signed)
 Patient presents for  therapeutic paracentesis. US  limited abdomen shows trace amount of ascites fluid noted  Insufficient to perform a safe paracentesis. Procedure not performed.

## 2023-12-21 NOTE — Plan of Care (Signed)
 Has been throwing items in her room on the floor.  Was reminded that she needs to remember to pick up those items and not throw things onto the floor.  Said that is what housekeeping is for.  Was reminded that is not appropriate and she did apologize and said she would do better and not trash the room.   Problem: Health Behavior/Discharge Planning: Goal: Ability to manage health-related needs will improve Outcome: Progressing   Problem: Clinical Measurements: Goal: Ability to maintain clinical measurements within normal limits will improve Outcome: Progressing   Problem: Clinical Measurements: Goal: Will remain free from infection Outcome: Progressing   Problem: Clinical Measurements: Goal: Respiratory complications will improve Outcome: Progressing   Problem: Clinical Measurements: Goal: Cardiovascular complication will be avoided Outcome: Progressing   Problem: Activity: Goal: Risk for activity intolerance will decrease Outcome: Progressing   Problem: Nutrition: Goal: Adequate nutrition will be maintained Outcome: Progressing   Problem: Coping: Goal: Level of anxiety will decrease Outcome: Progressing   Problem: Pain Managment: Goal: General experience of comfort will improve and/or be controlled Outcome: Progressing   Problem: Safety: Goal: Ability to remain free from injury will improve Outcome: Progressing

## 2023-12-22 ENCOUNTER — Inpatient Hospital Stay (HOSPITAL_COMMUNITY)

## 2023-12-22 DIAGNOSIS — K7460 Unspecified cirrhosis of liver: Secondary | ICD-10-CM | POA: Diagnosis not present

## 2023-12-22 MED ORDER — BENZONATATE 100 MG PO CAPS
100.0000 mg | ORAL_CAPSULE | Freq: Three times a day (TID) | ORAL | Status: AC | PRN
  Administered 2023-12-22 – 2024-01-27 (×3): 100 mg via ORAL
  Filled 2023-12-22 (×3): qty 1

## 2023-12-22 NOTE — Progress Notes (Addendum)
 Overnight cross coverage  RN reporting patient having cough with scant hemoptysis.  Vital signs stable.  Not on anticoagulation.  Chest x-ray ordered.  Antitussive  as needed.

## 2023-12-22 NOTE — Progress Notes (Signed)
 Pt coughed out some fresh blood, in scant amount after coughing out continuously, MD made aware.

## 2023-12-22 NOTE — Progress Notes (Signed)
 PROGRESS NOTE  Joan Wood  DOB: 1970/02/05  PCP: Edman Meade PEDLAR, FNP FMW:[id number]  DOA: 10/03/2023  LOS: 80 days  Hospital Day: 81  Subjective: Patient was seen and examined this morning. Sitting up in bed.  Not in distress Complains about not being allowed to drink enough fluid.  Understands that she is on fluid restriction. Afebrile, hemodynamically stable with heart rate in 90s and 100s Remains at some cough and hemoptysis this morning.  Chest x-ray unremarkable.  Brief narrative: Joan Wood is a 53 y.o. female with PMH significant for decompensated liver cirrhosis, PUD, gastric ulcer perforation s/p ex lap, GERD, pancreatitis, OSA  9/24, patient presented to the ED with coffee-ground emesis, bloody diarrhea.   EGD showed nonbleeding gastric ulcer, esophagitis and concern for possible esophageal necrosis.  Hospitalization was complicated by hepatic encephalopathy, treated with lactulose  and rifaximin .  Patient also required multiple paracentesis for her recurrent ascites.  Recent MELD of 35 with correlates to roughly 50% 3 month mortality. Difficult to place  Assessment and plan: Acute upper GI bleeding Acute blood loss anemia  EGD 10/04/2023 showed nonbleeding gastric ulcer, grade D esophagitis and esophageal necrosis GI recommended Carafate  for 2 weeks and Protonix  twice daily with outpatient GI follow-up.  Patient received 4 units of packed RBC during this hospitalization.    Hemoglobin stable between 8 and 9 in last several checks. Continue Protonix  40 mg twice daily Received IV Rocephin  earlier in the course for SBP prophylaxis Recent Labs    04/11/23 1455 05/11/23 1512 05/12/23 0930 05/12/23 0940 05/12/23 0943 05/13/23 0222 08/06/23 0419 08/07/23 0302 12/10/23 0210 12/12/23 0509 12/15/23 0616 12/15/23 0617 12/18/23 0502 12/20/23 0306  HGB 13.2   < >  --    < >  --    < > 7.8*   < > 8.1* 8.2* 9.5*  --  8.5* 8.4*  MCV 97   < >  --    < >  --    < >  92.8   < > 94.1 94.4 95.2  --  92.5 94.7  VITAMINB12  --   --  704  --   --   --  757  --   --   --   --  1,255*  --   --   FOLATE  --   --   --   --  13.6  --  6.5  --   --   --   --  >20.0  --   --   FERRITIN 165*  --  266  --   --   --   --   --   --   --  138  --   --   --   TIBC 373  --  317  --   --   --  310  --   --   --  318  --   --   --   IRON 73  --  119  --   --   --  8*  --   --   --  38  --   --   --   RETICCTPCT  --   --   --   --  3.0  --   --   --   --   --   --  2.8  --   --    < > = values in this interval not displayed.   Decompensated alcoholic cirrhosis  Hepatic  encephalopathy  Patient was initially treated with rifaximin  and lactulose  for encephalopathy.   At this time, only on lactulose  has been continued since rifaximin  will not be covered at the outpatient facility.   Ammonia level elevated at 59 on last check on 12/11..  Currently on lactulose  30 g 3 times daily. Continue to monitor. Repeat ammonia tomorrow Recent Labs  Lab 12/18/23 0502 12/20/23 0306  AST 77* 71*  ALT 38 38  ALKPHOS 79 73  BILITOT 1.2 1.2  PROT 6.7 6.3*  ALBUMIN  1.9* 1.9*  AMMONIA  --  59*  PLT 161 139*   Ascites requiring recurrent paracentesis Underwent therapeutic paracentesis on 11/12/2023 with removal of 750 cc of ascitic fluid.  Ascitic fluid was not consistent with SBP.   11/24/23: Underwent paracentesis with removal of 3.6 L of fluid 11/28: paracentesis yielded 4.3L 12/7: Paracentesis with 1.95 L removed 12/11, abdomen was noted to be distended.  Ultrasound paracentesis was requested but apparently she was only noted to have very small pocket of fluid Previous hospitalist spoke with IR about potential peritoneal drain placement since she had it in the past.  Recommended peritoneal drain if going to SNF as patient would not be able to manage the drain on her own without assistance.  Needs IR consult for peritoneal drain closer to discharge.  Lower extremity edema Chronic  hypotension Secondary to liver cirrhosis and portal hypertension Venous duplex rule out DVT Currently on oral Lasix  40 mg twice daily, midodrine  20 mg 3 times daily  Hypokalemia, hypomagnesemia Labs today showed potassium low at 3.3.  Replacements given. Recent Labs  Lab 12/18/23 0502 12/20/23 0306  K 3.3* 3.3*  MG  --  1.7  PHOS  --  4.2   Chronic mild hyponatremia Hypovolemic hyponatremia secondary to decompensated liver cirrhosis. Continue to monitor Recent Labs  Lab 12/18/23 0502 12/20/23 0306  NA 134* 129*   Enterobacter UTI Completed the course of antibiotics with Bactrim  DS twice daily   Acute appendicitis Noted on CT 10/2.   General surgery was consulted and patient was thought to be high surgical risk so was treated conservatively with antibiotics.  No further abdominal pain.   In-hospital fall Impaired mobility 11/30, pt was found on ground in feces Bilateral hip and ankle xrays did not show acute fractures. Seen by PT.  SNF recommended  Low back pain with right-sided radiculopathy Peripheral neuropathy Continues to complain of low back pain and right hip pain radiating to the leg to ankles.  12/8, MRI of the L-spine showed abnormal bone marrow edema involving the right sacral ala concerning for fracture, recommended CT pelvis.  Moderate spinal canal stenosis at L4-L5 with crowding of cauda equina nerve roots.   12/9, CT pelvis showed a nondisplaced right sacral ala fracture 12/12, because of the intensity and frequency of pain, I scheduled her on oxycodone  5 mg every 8 hours Pain regimen --- Scheduled: Neurontin  600 mg 3 times daily, oxycodone  5 mg every 8 hours. --- PRN: IV Dilaudid ,   Gout Had an acute gout flareup 11/22/2023.  Treated with colchicine  and prednisone . Nearing the end of prednisone  tapering course   Insomnia Continue Seroquel , melatonin nightly as needed   Chronic smoking  Counseling done to quit   Chronic alcohol  use No active  issues.   Continue multivitamin and thiamine .   No signs of withdrawal in the hospital   Severe malnutrition  Nutrition Problem: Severe Malnutrition Etiology: chronic illness (cirrhosis) Signs/Symptoms: severe fat depletion, severe muscle depletion Interventions: Ensure  Enlive (each supplement provides 350kcal and 20 grams of protein), MVI, Liberalize Diet, Magic cup   Goals of care   Code Status: Full Code     DVT prophylaxis:  SCDs Start: 10/03/23 2153   Antimicrobials: None currently Fluid: None Consultants: IR Family Communication: None at bedside  Status: Inpatient Level of care:  Progressive   Patient is from: Home Needs to continue in-hospital care: Difficult to place Anticipated d/c to: [facility] when placement is found..  Also needs peritoneal drain placement prior to discharge      Diet:  Diet Order             Diet 2 gram sodium Room service appropriate? Yes with Assist; Fluid consistency: Thin; Fluid restriction: 1500 mL Fluid  Diet effective now                   Scheduled Meds:  sodium chloride    Intravenous Once   calcium  carbonate  1 tablet Oral TID WC   feeding supplement  237 mL Oral TID BM   folic acid   1 mg Oral Daily   furosemide   40 mg Oral BID   gabapentin   600 mg Oral TID   lactulose   30 g Oral TID   lidocaine   2 patch Transdermal Q24H   lidocaine  (PF)  10 mL Other Once   midodrine   20 mg Oral TID WC   multivitamin with minerals  1 tablet Oral Daily   nutrition supplement (JUVEN)  1 packet Oral BID BM   oxyCODONE   5 mg Oral Q8H   pantoprazole   40 mg Oral BID   QUEtiapine   25 mg Oral QHS   thiamine   100 mg Oral Daily   triamcinolone  0.1 % cream : eucerin   Topical Daily    PRN meds: albuterol , alum & mag hydroxide-simeth, antiseptic oral rinse, artificial tears, benzonatate , HYDROmorphone  (DILAUDID ) injection, hydrOXYzine , melatonin, Muscle Rub, ondansetron , mouth rinse, phenol, prochlorperazine , simethicone , witch  hazel-glycerin    Infusions:    Antimicrobials: Anti-infectives (From admission, onward)    Start     Dose/Rate Route Frequency Ordered Stop   12/06/23 1930  fluconazole  (DIFLUCAN ) tablet 150 mg        150 mg Oral  Once 12/06/23 1831 12/06/23 1905   11/21/23 1115  sulfamethoxazole -trimethoprim  (BACTRIM  DS) 800-160 MG per tablet 1 tablet        1 tablet Oral Every 12 hours 11/21/23 1024 11/23/23 2117   11/20/23 1400  cefTRIAXone  (ROCEPHIN ) 1 g in sodium chloride  0.9 % 100 mL IVPB  Status:  Discontinued        1 g 200 mL/hr over 30 Minutes Intravenous Every 24 hours 11/20/23 1245 11/21/23 1024   11/12/23 1145  rifaximin  (XIFAXAN ) tablet 550 mg  Status:  Discontinued        550 mg Oral 2 times daily 11/12/23 1058 11/22/23 1628   10/29/23 0000  rifaximin  (XIFAXAN ) 550 MG TABS tablet        550 mg Oral 2 times daily 10/29/23 1129     10/25/23 1415  rifaximin  (XIFAXAN ) tablet 550 mg  Status:  Discontinued        550 mg Oral 2 times daily 10/25/23 1315 11/10/23 1244   10/12/23 1000  metroNIDAZOLE  (FLAGYL ) tablet 500 mg        500 mg Oral Every 12 hours 10/12/23 0734 10/22/23 2125   10/12/23 0830  cefTRIAXone  (ROCEPHIN ) 2 g in sodium chloride  0.9 % 100 mL IVPB  2 g 200 mL/hr over 30 Minutes Intravenous Every 24 hours 10/12/23 0734 10/22/23 1636   10/07/23 1000  fluconazole  (DIFLUCAN ) tablet 100 mg  Status:  Discontinued        100 mg Oral Daily 10/07/23 0853 10/07/23 0855   10/07/23 1000  fluconazole  (DIFLUCAN ) tablet 150 mg        150 mg Oral Daily 10/07/23 0855 10/07/23 1014   10/04/23 1000  cefTRIAXone  (ROCEPHIN ) 2 g in sodium chloride  0.9 % 100 mL IVPB        2 g 200 mL/hr over 30 Minutes Intravenous Every 24 hours 10/03/23 2154 10/08/23 0854   10/03/23 2015  cefTRIAXone  (ROCEPHIN ) 1 g in sodium chloride  0.9 % 100 mL IVPB        1 g 200 mL/hr over 30 Minutes Intravenous  Once 10/03/23 2010 10/03/23 2212       Objective: Vitals:   12/22/23 0402 12/22/23 0738  BP: 121/76  124/79  Pulse: 100 (!) 107  Resp: 18 18  Temp: 98.7 F (37.1 C) 98.2 F (36.8 C)  SpO2: 98% 100%    Intake/Output Summary (Last 24 hours) at 12/22/2023 1203 Last data filed at 12/22/2023 1151 Gross per 24 hour  Intake 780 ml  Output 400 ml  Net 380 ml   Filed Weights   12/17/23 0600 12/18/23 0439 12/19/23 0500  Weight: (P) 59.6 kg 66.3 kg 59.6 kg   Weight change:  Body mass index is 24.02 kg/m.   Physical Exam: General exam: Pleasant, middle-aged Caucasian female Skin: No rashes, lesions or ulcers. HEENT: Atraumatic, normocephalic, no obvious bleeding Lungs: Clear to auscultation bilaterally,  CVS: S1, S2, no murmur,   GI/Abd: Soft, nontender, looks distended, bowel sound present,   CNS: Alert, awake, tearful in pain.  Later she was resting comfortably. Psychiatry: Sad affect Extremities: trace pedal edema, no calf tenderness,   Data Review: I have personally reviewed the laboratory data and studies available.  F/u labs ordered. Unresulted Labs (From admission, onward)     Start     Ordered   12/23/23 0500  CBC with Differential/Platelet  Tomorrow morning,   R       Question:  Specimen collection method  Answer:  Lab=Lab collect   12/22/23 0830   12/23/23 0500  Comprehensive metabolic panel with GFR  Tomorrow morning,   R       Question:  Specimen collection method  Answer:  Lab=Lab collect   12/22/23 0830            Signed, Chapman Rota, MD [facility] 12/22/2023

## 2023-12-22 NOTE — Plan of Care (Signed)
  Problem: Clinical Measurements: Goal: Will remain free from infection Outcome: Progressing   Problem: Activity: Goal: Risk for activity intolerance will decrease Outcome: Progressing   Problem: Elimination: Goal: Will not experience complications related to bowel motility Outcome: Progressing   Problem: Pain Managment: Goal: General experience of comfort will improve and/or be controlled Outcome: Progressing   Problem: Safety: Goal: Ability to remain free from injury will improve Outcome: Progressing   Problem: Skin Integrity: Goal: Risk for impaired skin integrity will decrease Outcome: Progressing

## 2023-12-22 NOTE — Plan of Care (Signed)
  Problem: Clinical Measurements: Goal: Ability to maintain clinical measurements within normal limits will improve Outcome: Not Progressing Goal: Will remain free from infection Outcome: Not Progressing   Problem: Clinical Measurements: Goal: Will remain free from infection Outcome: Not Progressing   

## 2023-12-23 DIAGNOSIS — K7290 Hepatic failure, unspecified without coma: Secondary | ICD-10-CM | POA: Diagnosis not present

## 2023-12-23 DIAGNOSIS — K7460 Unspecified cirrhosis of liver: Secondary | ICD-10-CM | POA: Diagnosis not present

## 2023-12-23 LAB — CBC WITH DIFFERENTIAL/PLATELET
Abs Immature Granulocytes: 0.02 K/uL (ref 0.00–0.07)
Basophils Absolute: 0 K/uL (ref 0.0–0.1)
Basophils Relative: 0 %
Eosinophils Absolute: 0.1 K/uL (ref 0.0–0.5)
Eosinophils Relative: 2 %
HCT: 27.2 % — ABNORMAL LOW (ref 36.0–46.0)
Hemoglobin: 9.2 g/dL — ABNORMAL LOW (ref 12.0–15.0)
Immature Granulocytes: 0 %
Lymphocytes Relative: 17 %
Lymphs Abs: 1 K/uL (ref 0.7–4.0)
MCH: 31.5 pg (ref 26.0–34.0)
MCHC: 33.8 g/dL (ref 30.0–36.0)
MCV: 93.2 fL (ref 80.0–100.0)
Monocytes Absolute: 1.1 K/uL — ABNORMAL HIGH (ref 0.1–1.0)
Monocytes Relative: 19 %
Neutro Abs: 3.6 K/uL (ref 1.7–7.7)
Neutrophils Relative %: 62 %
Platelets: 144 K/uL — ABNORMAL LOW (ref 150–400)
RBC: 2.92 MIL/uL — ABNORMAL LOW (ref 3.87–5.11)
RDW: 15.1 % (ref 11.5–15.5)
WBC: 5.9 K/uL (ref 4.0–10.5)
nRBC: 0 % (ref 0.0–0.2)

## 2023-12-23 LAB — COMPREHENSIVE METABOLIC PANEL WITH GFR
ALT: 34 U/L (ref 0–44)
AST: 64 U/L — ABNORMAL HIGH (ref 15–41)
Albumin: 1.9 g/dL — ABNORMAL LOW (ref 3.5–5.0)
Alkaline Phosphatase: 84 U/L (ref 38–126)
Anion gap: 5 (ref 5–15)
BUN: 15 mg/dL (ref 6–20)
CO2: 26 mmol/L (ref 22–32)
Calcium: 8 mg/dL — ABNORMAL LOW (ref 8.9–10.3)
Chloride: 96 mmol/L — ABNORMAL LOW (ref 98–111)
Creatinine, Ser: 0.51 mg/dL (ref 0.44–1.00)
GFR, Estimated: >60 mL/min (ref >60)
Glucose, Bld: 82 mg/dL (ref 70–99)
Potassium: 3.3 mmol/L — ABNORMAL LOW (ref 3.5–5.1)
Sodium: 127 mmol/L — ABNORMAL LOW (ref 135–145)
Total Bilirubin: 1.4 mg/dL — ABNORMAL HIGH (ref 0.0–1.2)
Total Protein: 6.6 g/dL (ref 6.5–8.1)

## 2023-12-23 MED ORDER — POTASSIUM CHLORIDE CRYS ER 20 MEQ PO TBCR
40.0000 meq | EXTENDED_RELEASE_TABLET | Freq: Every day | ORAL | Status: DC
  Administered 2023-12-23 – 2024-01-04 (×13): 40 meq via ORAL
  Filled 2023-12-23 (×4): qty 2
  Filled 2023-12-23: qty 4
  Filled 2023-12-23 (×8): qty 2

## 2023-12-23 NOTE — Plan of Care (Signed)
°  Problem: Health Behavior/Discharge Planning: Goal: Ability to manage health-related needs will improve Outcome: Not Progressing   Problem: Clinical Measurements: Goal: Ability to maintain clinical measurements within normal limits will improve Outcome: Not Progressing Goal: Will remain free from infection Outcome: Not Progressing Goal: Respiratory complications will improve Outcome: Not Progressing   Problem: Clinical Measurements: Goal: Will remain free from infection Outcome: Not Progressing   Problem: Clinical Measurements: Goal: Respiratory complications will improve Outcome: Not Progressing

## 2023-12-23 NOTE — Progress Notes (Signed)
 PROGRESS NOTE  Joan Wood  DOB: 07-Sep-1970  PCP: Edman Meade PEDLAR, FNP FMW:[id number]  DOA: 10/03/2023  LOS: 81 days  Hospital Day: 82  Subjective: Patient was seen and examined this morning. Lying on bed.  Not in distress. Pain controlled today Afebrile, heart rate in low 100s, blood pressure in 110s, breathing on room air Labs this morning with sodium lower at 127, albumin  low at 1.9, hemoglobin 9.2   Brief narrative: Joan Wood is a 53 y.o. female with PMH significant for decompensated liver cirrhosis, PUD, gastric ulcer perforation s/p ex lap, GERD, pancreatitis, OSA  9/24, patient presented to the ED with coffee-ground emesis, bloody diarrhea.   EGD showed nonbleeding gastric ulcer, esophagitis and concern for possible esophageal necrosis.  Hospitalization was complicated by hepatic encephalopathy, treated with lactulose  and rifaximin .  Patient also required multiple paracentesis for her recurrent ascites.  Recent MELD of 35 with correlates to roughly 50% 3 month mortality. Difficult to place  Assessment and plan: Acute upper GI bleeding Acute blood loss anemia  EGD 10/04/2023 showed nonbleeding gastric ulcer, grade D esophagitis and esophageal necrosis Currently on Protonix  40 mg twice daily.   Completed 2 weeks course of Carafate .   Received IV Rocephin  earlier in the course for SBP prophylaxis Patient received 4 units of packed RBC during this hospitalization.  Hemoglobin stable close to 9 in last several checks. Need to follow-up outpatient with GI.   Recent Labs    04/11/23 1455 05/11/23 1512 05/12/23 0930 05/12/23 0940 05/12/23 0943 05/13/23 0222 08/06/23 0419 08/07/23 0302 12/12/23 0509 12/15/23 0616 12/15/23 0617 12/18/23 0502 12/20/23 0306 12/23/23 0639  HGB 13.2   < >  --    < >  --    < > 7.8*   < > 8.2* 9.5*  --  8.5* 8.4* 9.2*  MCV 97   < >  --    < >  --    < > 92.8   < > 94.4 95.2  --  92.5 94.7 93.2  VITAMINB12  --   --  704  --   --    --  757  --   --   --  1,255*  --   --   --   FOLATE  --   --   --   --  13.6  --  6.5  --   --   --  >20.0  --   --   --   FERRITIN 165*  --  266  --   --   --   --   --   --  138  --   --   --   --   TIBC 373  --  317  --   --   --  310  --   --  318  --   --   --   --   IRON 73  --  119  --   --   --  8*  --   --  38  --   --   --   --   RETICCTPCT  --   --   --   --  3.0  --   --   --   --   --  2.8  --   --   --    < > = values in this interval not displayed.   Decompensated alcoholic cirrhosis  Hepatic encephalopathy  Patient was initially treated with  rifaximin  and lactulose  for encephalopathy.   At this time, only lactulose  has been continued since rifaximin  will not be covered at the outpatient facility.   Ammonia level elevated at 59 on last check on 12/11..  Mental status seems stable though. Currently on lactulose  30 g 3 times daily. Continue to monitor. Repeat ammonia in next few days. Recent Labs  Lab 12/18/23 0502 12/20/23 0306 12/23/23 0639  AST 77* 71* 64*  ALT 38 38 34  ALKPHOS 79 73 84  BILITOT 1.2 1.2 1.4*  PROT 6.7 6.3* 6.6  ALBUMIN  1.9* 1.9* 1.9*  AMMONIA  --  59*  --   PLT 161 139* 144*   Ascites requiring recurrent paracentesis Has undergone paracentesis 11/3, 11/15, 11/28, 12/7 with removal of at least a liter of fluid each time.  12/11, abdomen was noted to be distended.  Ultrasound paracentesis was requested but apparently she was only noted to have very small pocket of fluid Previous hospitalist spoke with IR about potential peritoneal drain placement since she had it in the past.  Recommended peritoneal drain if going to SNF as patient would not be able to manage the drain on her own without assistance.  Needs IR consult for peritoneal drain closer to discharge.  Lower extremity edema Chronic hypotension Secondary to liver cirrhosis and portal hypertension 10/12/2023, venous duplex ruled out DVT Currently on oral Lasix  40 mg twice daily, midodrine   20 mg 3 times daily  Hypokalemia, hypomagnesemia Chronically low on potassium with Lasix . 12/14, I have started KCl 40 mEq daily Recent Labs  Lab 12/18/23 0502 12/20/23 0306 12/23/23 0639  K 3.3* 3.3* 3.3*  MG  --  1.7  --   PHOS  --  4.2  --    Chronic mild hyponatremia Hypovolemic hyponatremia secondary to decompensated liver cirrhosis. Sodium lower at 127 today.  Recommended to limit free water intake which she has been inconsistent with. Repeat labs in the next few days. Recent Labs  Lab 12/18/23 0502 12/20/23 0306 12/23/23 0639  NA 134* 129* 127*   Enterobacter UTI Completed the course of antibiotics with Bactrim  DS twice daily   Acute appendicitis Noted on CT 10/2.   General surgery was consulted and patient was thought to be high surgical risk so was treated conservatively with antibiotics.  No further abdominal pain.   In-hospital fall Impaired mobility 11/30, pt was found on ground in feces Bilateral hip and ankle xrays did not show acute fractures. Seen by PT.  SNF recommended  Low back pain with right-sided radiculopathy Peripheral neuropathy Continues to complain of low back pain and right hip pain radiating to the leg to ankles.  12/8, MRI of the L-spine showed abnormal bone marrow edema involving the right sacral ala concerning for fracture, recommended CT pelvis.  Moderate spinal canal stenosis at L4-L5 with crowding of cauda equina nerve roots.   12/9, CT pelvis showed a nondisplaced right sacral ala fracture 12/12, because of the intensity and frequency of pain, I scheduled her on oxycodone  5 mg every 8 hours Pain regimen --- Scheduled: Neurontin  600 mg 3 times daily, oxycodone  5 mg every 8 hours. --- PRN: IV Dilaudid ,   Gout Had an acute gout flareup 11/22/2023.  Treated with colchicine  and prednisone . Nearing the end of prednisone  tapering course   Insomnia Continue Seroquel , melatonin nightly as needed   Chronic smoking  Counseling done to  quit   Chronic alcohol  use No active issues.   Continue multivitamin and thiamine .   No signs  of withdrawal in the hospital   Severe malnutrition  Nutrition Problem: Severe Malnutrition Etiology: chronic illness (cirrhosis) Signs/Symptoms: severe fat depletion, severe muscle depletion Interventions: Ensure Enlive (each supplement provides 350kcal and 20 grams of protein), MVI, Liberalize Diet, Magic cup   Goals of care   Code Status: Full Code     DVT prophylaxis:  SCDs Start: 10/03/23 2153   Antimicrobials: None currently Fluid: None Consultants: IR Family Communication: None at bedside  Status: Inpatient Level of care:  Progressive   Patient is from: Home Needs to continue in-hospital care: Difficult to place Anticipated d/c to: [facility] when placement is found..  Also needs peritoneal drain placement prior to discharge      Diet:  Diet Order             Diet 2 gram sodium Room service appropriate? Yes with Assist; Fluid consistency: Thin; Fluid restriction: 1500 mL Fluid  Diet effective now                   Scheduled Meds:  sodium chloride    Intravenous Once   calcium  carbonate  1 tablet Oral TID WC   feeding supplement  237 mL Oral TID BM   folic acid   1 mg Oral Daily   furosemide   40 mg Oral BID   gabapentin   600 mg Oral TID   lactulose   30 g Oral TID   lidocaine   2 patch Transdermal Q24H   lidocaine  (PF)  10 mL Other Once   midodrine   20 mg Oral TID WC   multivitamin with minerals  1 tablet Oral Daily   nutrition supplement (JUVEN)  1 packet Oral BID BM   oxyCODONE   5 mg Oral Q8H   pantoprazole   40 mg Oral BID   potassium chloride   40 mEq Oral Daily   QUEtiapine   25 mg Oral QHS   thiamine   100 mg Oral Daily   triamcinolone  0.1 % cream : eucerin   Topical Daily    PRN meds: albuterol , alum & mag hydroxide-simeth, antiseptic oral rinse, artificial tears, benzonatate , HYDROmorphone  (DILAUDID ) injection, hydrOXYzine , melatonin, Muscle  Rub, ondansetron , mouth rinse, phenol, prochlorperazine , simethicone , witch hazel-glycerin    Infusions:    Antimicrobials: Anti-infectives (From admission, onward)    Start     Dose/Rate Route Frequency Ordered Stop   12/06/23 1930  fluconazole  (DIFLUCAN ) tablet 150 mg        150 mg Oral  Once 12/06/23 1831 12/06/23 1905   11/21/23 1115  sulfamethoxazole -trimethoprim  (BACTRIM  DS) 800-160 MG per tablet 1 tablet        1 tablet Oral Every 12 hours 11/21/23 1024 11/23/23 2117   11/20/23 1400  cefTRIAXone  (ROCEPHIN ) 1 g in sodium chloride  0.9 % 100 mL IVPB  Status:  Discontinued        1 g 200 mL/hr over 30 Minutes Intravenous Every 24 hours 11/20/23 1245 11/21/23 1024   11/12/23 1145  rifaximin  (XIFAXAN ) tablet 550 mg  Status:  Discontinued        550 mg Oral 2 times daily 11/12/23 1058 11/22/23 1628   10/29/23 0000  rifaximin  (XIFAXAN ) 550 MG TABS tablet        550 mg Oral 2 times daily 10/29/23 1129     10/25/23 1415  rifaximin  (XIFAXAN ) tablet 550 mg  Status:  Discontinued        550 mg Oral 2 times daily 10/25/23 1315 11/10/23 1244   10/12/23 1000  metroNIDAZOLE  (FLAGYL ) tablet 500 mg  500 mg Oral Every 12 hours 10/12/23 0734 10/22/23 2125   10/12/23 0830  cefTRIAXone  (ROCEPHIN ) 2 g in sodium chloride  0.9 % 100 mL IVPB        2 g 200 mL/hr over 30 Minutes Intravenous Every 24 hours 10/12/23 0734 10/22/23 1636   10/07/23 1000  fluconazole  (DIFLUCAN ) tablet 100 mg  Status:  Discontinued        100 mg Oral Daily 10/07/23 0853 10/07/23 0855   10/07/23 1000  fluconazole  (DIFLUCAN ) tablet 150 mg        150 mg Oral Daily 10/07/23 0855 10/07/23 1014   10/04/23 1000  cefTRIAXone  (ROCEPHIN ) 2 g in sodium chloride  0.9 % 100 mL IVPB        2 g 200 mL/hr over 30 Minutes Intravenous Every 24 hours 10/03/23 2154 10/08/23 0854   10/03/23 2015  cefTRIAXone  (ROCEPHIN ) 1 g in sodium chloride  0.9 % 100 mL IVPB        1 g 200 mL/hr over 30 Minutes Intravenous  Once 10/03/23 2010 10/03/23 2212        Objective: Vitals:   12/23/23 0743 12/23/23 1116  BP: 115/62 123/70  Pulse: (!) 115 (!) 112  Resp: 20 20  Temp: 99 F (37.2 C) 99.4 F (37.4 C)  SpO2: 100% 100%    Intake/Output Summary (Last 24 hours) at 12/23/2023 1425 Last data filed at 12/23/2023 0830 Gross per 24 hour  Intake 800 ml  Output 400 ml  Net 400 ml   Filed Weights   12/18/23 0439 12/19/23 0500 12/23/23 0500  Weight: 66.3 kg 59.6 kg 61 kg   Weight change:  Body mass index is 24.6 kg/m.   Physical Exam: General exam: Pleasant, middle-aged Caucasian female Skin: No rashes, lesions or ulcers. HEENT: Atraumatic, normocephalic, no obvious bleeding Lungs: Clear to auscultation bilaterally,  CVS: S1, S2, no murmur,   GI/Abd: Soft, nontender, looks distended, bowel sound present,   CNS: Alert, awake, slow to respond oriented x 3 Psychiatry: Mood appropriate Extremities: trace pedal edema, no calf tenderness,   Data Review: I have personally reviewed the laboratory data and studies available.  F/u labs ordered. Unresulted Labs (From admission, onward)    None       Signed, Chapman Rota, MD [facility] 12/23/2023

## 2023-12-23 NOTE — Plan of Care (Signed)
  Problem: Health Behavior/Discharge Planning: Goal: Ability to manage health-related needs will improve Outcome: Progressing   Problem: Clinical Measurements: Goal: Will remain free from infection Outcome: Progressing   Problem: Clinical Measurements: Goal: Diagnostic test results will improve Outcome: Progressing   Problem: Activity: Goal: Risk for activity intolerance will decrease Outcome: Progressing   Problem: Nutrition: Goal: Adequate nutrition will be maintained Outcome: Progressing   Problem: Coping: Goal: Level of anxiety will decrease Outcome: Progressing   Problem: Pain Managment: Goal: General experience of comfort will improve and/or be controlled Outcome: Progressing   Problem: Safety: Goal: Ability to remain free from injury will improve Outcome: Progressing   Problem: Skin Integrity: Goal: Risk for impaired skin integrity will decrease Outcome: Progressing

## 2023-12-24 DIAGNOSIS — K7290 Hepatic failure, unspecified without coma: Secondary | ICD-10-CM | POA: Diagnosis not present

## 2023-12-24 DIAGNOSIS — K7460 Unspecified cirrhosis of liver: Secondary | ICD-10-CM | POA: Diagnosis not present

## 2023-12-24 MED ORDER — ACETAMINOPHEN 500 MG PO TABS
500.0000 mg | ORAL_TABLET | Freq: Once | ORAL | Status: AC
  Administered 2023-12-24: 17:00:00 500 mg via ORAL
  Filled 2023-12-24: qty 1

## 2023-12-24 NOTE — Progress Notes (Signed)
 PROGRESS NOTE  Joan Wood  DOB: [birth year]  PCP: Edman Meade PEDLAR, FNP FMW:[id number]  DOA: 10/03/2023  LOS: 82 days  Hospital Day: 83 Brief narrative: Joan Wood is a 53 y.o. female with PMH significant for decompensated liver cirrhosis, PUD, gastric ulcer perforation s/p ex lap, GERD, pancreatitis, OSA  9/24, patient presented to the ED with coffee-ground emesis, bloody diarrhea.   EGD showed nonbleeding gastric ulcer, esophagitis and concern for possible esophageal necrosis.  Hospitalization was complicated by hepatic encephalopathy, treated with lactulose  and rifaximin .  Patient also required multiple paracentesis for her recurrent ascites.  Recent MELD of 35 with correlates to roughly 50% 3 month mortality. Difficult to place  Assessment and plan: Acute upper GI bleeding Acute blood loss anemia  EGD 10/04/2023 showed nonbleeding gastric ulcer, grade D esophagitis and esophageal necrosis Currently on Protonix  40 mg twice daily.   Completed 2 weeks course of Carafate .   Received IV Rocephin  earlier in the course for SBP prophylaxis Patient received 4 units of packed RBC during this hospitalization.  Hemoglobin stable close to 9 in last several checks. Need to follow-up outpatient with GI.    Decompensated alcoholic cirrhosis  Hepatic encephalopathy  Patient was initially treated with rifaximin  and lactulose  for encephalopathy.   At this time, only lactulose  has been continued since rifaximin  will not be covered at the outpatient facility.   Ammonia level elevated at 59 on last check on 12/11..  Mental status seems stable though. Currently on lactulose  30 g 3 times daily. Continue to monitor. Repeat ammonia in next few days.  Ascites requiring recurrent paracentesis Has undergone paracentesis 11/3, 11/15, 11/28, 12/7 with removal of at least a liter of fluid each time.  12/11, abdomen was noted to be distended.  Ultrasound paracentesis was requested but apparently she  was only noted to have very small pocket of fluid Previous hospitalist spoke with IR about potential peritoneal drain placement since she had it in the past.  Recommended peritoneal drain if going to SNF as patient would not be able to manage the drain on her own without assistance.  Needs IR consult for peritoneal drain closer to discharge.  Lower extremity edema Chronic hypotension Secondary to liver cirrhosis and portal hypertension 10/12/2023, venous duplex ruled out DVT Currently on oral Lasix  40 mg twice daily, midodrine  20 mg 3 times daily  Hypokalemia, hypomagnesemia Chronically low on potassium with Lasix . 12/14, I have started KCl 40 mEq daily  Chronic mild hyponatremia Hypovolemic hyponatremia secondary to decompensated liver cirrhosis. Sodium lower at 127 today.  Recommended to limit free water intake which she has been inconsistent with. Repeat labs in the next few days.  Enterobacter UTI Completed the course of antibiotics with Bactrim  DS twice daily   Acute appendicitis Noted on CT 10/2.   General surgery was consulted and patient was thought to be high surgical risk so was treated conservatively with antibiotics.  No further abdominal pain.   In-hospital fall Impaired mobility 11/30, pt was found on ground in feces Bilateral hip and ankle xrays did not show acute fractures. Seen by PT.  SNF recommended  Low back pain with right-sided radiculopathy Peripheral neuropathy Continues to complain of low back pain and right hip pain radiating to the leg to ankles.  12/8, MRI of the L-spine showed abnormal bone marrow edema involving the right sacral ala concerning for fracture, recommended CT pelvis.  Moderate spinal canal stenosis at L4-L5 with crowding of cauda equina nerve roots.   12/9, CT  pelvis showed a nondisplaced right sacral ala fracture 12/12, because of the intensity and frequency of pain, I scheduled her on oxycodone  5 mg every 8 hours Pain regimen ---  Scheduled: Neurontin  600 mg 3 times daily, oxycodone  5 mg every 8 hours. --- PRN: IV Dilaudid ,   Gout Had an acute gout flareup 11/22/2023.  Treated with colchicine  and prednisone . Nearing the end of prednisone  tapering course   Insomnia Continue Seroquel , melatonin nightly as needed   Chronic smoking  Counseling done to quit   Chronic alcohol  use No active issues.   Continue multivitamin and thiamine .   No signs of withdrawal in the hospital   Severe malnutrition  Nutrition Problem: Severe Malnutrition Etiology: chronic illness (cirrhosis) Signs/Symptoms: severe fat depletion, severe muscle depletion Interventions: Ensure Enlive (each supplement provides 350kcal and 20 grams of protein), MVI, Liberalize Diet, Magic cup   Goals of care   Code Status: Full Code     DVT prophylaxis:  SCDs Start: 10/03/23 2153   Antimicrobials: None currently Fluid: None Consultants: IR Family Communication: None at bedside  Status: Inpatient Level of care:  Progressive   Patient is from: Home Needs to continue in-hospital care: Difficult to place Anticipated d/c to: Ultimately SNF when placement is found..  Also needs peritoneal drain placement prior to discharge      Diet:  Diet Order             Diet 2 gram sodium Room service appropriate? Yes with Assist; Fluid consistency: Thin; Fluid restriction: 1500 mL Fluid  Diet effective now                   Scheduled Meds:  sodium chloride    Intravenous Once   calcium  carbonate  1 tablet Oral TID WC   feeding supplement  237 mL Oral TID BM   folic acid   1 mg Oral Daily   furosemide   40 mg Oral BID   gabapentin   600 mg Oral TID   lactulose   30 g Oral TID   lidocaine   2 patch Transdermal Q24H   lidocaine  (PF)  10 mL Other Once   midodrine   20 mg Oral TID WC   multivitamin with minerals  1 tablet Oral Daily   nutrition supplement (JUVEN)  1 packet Oral BID BM   oxyCODONE   5 mg Oral Q8H   pantoprazole   40 mg Oral BID    potassium chloride   40 mEq Oral Daily   QUEtiapine   25 mg Oral QHS   thiamine   100 mg Oral Daily   triamcinolone  0.1 % cream : eucerin   Topical Daily    PRN meds: albuterol , alum & mag hydroxide-simeth, antiseptic oral rinse, artificial tears, benzonatate , HYDROmorphone  (DILAUDID ) injection, hydrOXYzine , melatonin, Muscle Rub, ondansetron , mouth rinse, phenol, prochlorperazine , simethicone , witch hazel-glycerin    Infusions:    Antimicrobials: Anti-infectives (From admission, onward)    Start     Dose/Rate Route Frequency Ordered Stop   12/06/23 1930  fluconazole  (DIFLUCAN ) tablet 150 mg        150 mg Oral  Once 12/06/23 1831 12/06/23 1905   11/21/23 1115  sulfamethoxazole -trimethoprim  (BACTRIM  DS) 800-160 MG per tablet 1 tablet        1 tablet Oral Every 12 hours 11/21/23 1024 11/23/23 2117   11/20/23 1400  cefTRIAXone  (ROCEPHIN ) 1 g in sodium chloride  0.9 % 100 mL IVPB  Status:  Discontinued        1 g 200 mL/hr over 30 Minutes Intravenous Every 24 hours 11/20/23  1245 11/21/23 1024   11/12/23 1145  rifaximin  (XIFAXAN ) tablet 550 mg  Status:  Discontinued        550 mg Oral 2 times daily 11/12/23 1058 11/22/23 1628   10/29/23 0000  rifaximin  (XIFAXAN ) 550 MG TABS tablet        550 mg Oral 2 times daily 10/29/23 1129     10/25/23 1415  rifaximin  (XIFAXAN ) tablet 550 mg  Status:  Discontinued        550 mg Oral 2 times daily 10/25/23 1315 11/10/23 1244   10/12/23 1000  metroNIDAZOLE  (FLAGYL ) tablet 500 mg        500 mg Oral Every 12 hours 10/12/23 0734 10/22/23 2125   10/12/23 0830  cefTRIAXone  (ROCEPHIN ) 2 g in sodium chloride  0.9 % 100 mL IVPB        2 g 200 mL/hr over 30 Minutes Intravenous Every 24 hours 10/12/23 0734 10/22/23 1636   10/07/23 1000  fluconazole  (DIFLUCAN ) tablet 100 mg  Status:  Discontinued        100 mg Oral Daily 10/07/23 0853 10/07/23 0855   10/07/23 1000  fluconazole  (DIFLUCAN ) tablet 150 mg        150 mg Oral Daily 10/07/23 0855 10/07/23 1014    10/04/23 1000  cefTRIAXone  (ROCEPHIN ) 2 g in sodium chloride  0.9 % 100 mL IVPB        2 g 200 mL/hr over 30 Minutes Intravenous Every 24 hours 10/03/23 2154 10/08/23 0854   10/03/23 2015  cefTRIAXone  (ROCEPHIN ) 1 g in sodium chloride  0.9 % 100 mL IVPB        1 g 200 mL/hr over 30 Minutes Intravenous  Once 10/03/23 2010 10/03/23 2212     Subjective: Patient was seen and examined this morning. No acute overnight events. Pressure running soft, she is asymptomatic. Objective: Vitals:   12/24/23 0803 12/24/23 1146  BP: 114/60 (!) 107/57  Pulse: (!) 105 (!) 105  Resp: 18 18  Temp: 99.1 F (37.3 C) 99 F (37.2 C)  SpO2: 98% 100%    Intake/Output Summary (Last 24 hours) at 12/24/2023 1224 Last data filed at 12/24/2023 0600 Gross per 24 hour  Intake 1200 ml  Output 250 ml  Net 950 ml   Filed Weights   12/19/23 0500 12/23/23 0500 12/24/23 0625  Weight: 59.6 kg 61 kg 61.8 kg   Weight change: 0.78 kg Body mass index is 24.91 kg/m.   Physical Exam: General  No acute Distress Eyes: PERRL, lids and conjunctivae normal ENMT: Mucous membranes are moist.   Neck: normal, supple, no masses, no thyromegaly Respiratory: clear to auscultation bilaterally, no wheezing, no crackles. Normal respiratory effort. No accessory muscle use.  Cardiovascular: Regular rate and rhythm, no murmurs / rubs / gallops Abdomen: Soft, nontender nondistended. Musculoskeletal: no clubbing / cyanosis. No joint deformity upper and lower extremities.  Skin: no rashes, lesions, ulcers. No induration Neurologic: Facial asymmetry, moving extremity spontaneously, speech fluent. Psychiatric: Normal judgment and insight. Alert and oriented x 3. Normal mood.    Data Review: I have personally reviewed the laboratory data and studies available.  F/u labs ordered. Unresulted Labs (From admission, onward)    None       Signed, Landon FORBES Baller, MD [facility] 12/24/2023

## 2023-12-25 DIAGNOSIS — K7460 Unspecified cirrhosis of liver: Secondary | ICD-10-CM | POA: Diagnosis not present

## 2023-12-25 DIAGNOSIS — K7290 Hepatic failure, unspecified without coma: Secondary | ICD-10-CM | POA: Diagnosis not present

## 2023-12-25 NOTE — Progress Notes (Signed)
 Occupational Therapy Treatment Patient Details Name: Joan Wood MRN: [id number] DOB: 04-Mar-1970 Today's Date: 12/25/2023   History of present illness 53 y.o. female admitted 10/03/2023 with nausea/vomiting and bloody diarrhea. Workup for decompensated alcoholic cirrhosis, ascites, hepatic encephalopathy. S/p upper EGD 9/25 concerning for possible acute esophageal necrosis, portal hypertension, non-bleeding gastric and duodenal ulcers. S/p recurrent paracentesis for recurrent ascites; IR deferred PleurX catheter placement due to social situation. PMH includes decompensated cirrhosis, PUD, GERD, pancreatitis, OSA, peripheral neuropathy, tobacco abuse, alcohol  abuse, severe protein malnutrition.   OT comments  Pt seen for goal update with goals kept due to slow progression as well as one cognitive goal added as pt with difficulty attending to task, internally distracted, exhibiting occasional attention seeking behaviors. Pt needing up to CGA for OOB ADL and nearly constant cueing to sustain tasks; unsure if lonely; may benefit from spiritual care consult. Pt additionally with some confusion throughout session, appearing to need increased time to think of what she wants to say and perseverating on some topics. Patient will benefit from continued inpatient follow up therapy, <3 hours/day       If plan is discharge home, recommend the following:  A little help with bathing/dressing/bathroom;Direct supervision/assist for medications management;Direct supervision/assist for financial management;Assist for transportation;Supervision due to cognitive status   Equipment Recommendations  BSC/3in1    Recommendations for Other Services      Precautions / Restrictions Precautions Precautions: Fall Recall of Precautions/Restrictions: Impaired Precaution/Restrictions Comments: hx of orthostasis Restrictions Weight Bearing Restrictions Per Provider Order: No       Mobility Bed Mobility Overal bed  mobility: Needs Assistance Bed Mobility: Supine to Sit     Supine to sit: Min assist          Transfers Overall transfer level: Needs assistance Equipment used: Rolling walker (2 wheels) Transfers: Sit to/from Stand Sit to Stand: Contact guard assist                 Balance Overall balance assessment: Needs assistance Sitting-balance support: No upper extremity supported, Feet supported Sitting balance-Leahy Scale: Good Sitting balance - Comments: EOB   Standing balance support: During functional activity, No upper extremity supported, Bilateral upper extremity supported                               ADL either performed or assessed with clinical judgement   ADL Overall ADL's : Needs assistance/impaired     Grooming: Wash/dry face;Standing;Contact guard assist               Lower Body Dressing: Contact guard assist;Sit to/from stand       Toileting- Clothing Manipulation and Hygiene: Sit to/from stand;Contact guard assist Toileting - Clothing Manipulation Details (indicate cue type and reason): increased time for anterior pericare     Functional mobility during ADLs: Contact guard assist;Rolling walker (2 wheels)      Extremity/Trunk Assessment Upper Extremity Assessment Upper Extremity Assessment: Generalized weakness   Lower Extremity Assessment Lower Extremity Assessment: Defer to PT evaluation        Vision   Vision Assessment?: Wears glasses for reading;Wears glasses for driving   Perception     Praxis     Communication Communication Communication: No apparent difficulties   Cognition Arousal: Alert Behavior During Therapy: WFL for tasks assessed/performed Cognition: Cognition impaired   Orientation impairments: Time Awareness: Intellectual awareness impaired Memory impairment (select all impairments): Short-term memory, Working memory Attention impairment (select first  level of impairment): Sustained  attention Executive functioning impairment (select all impairments): Organization, Reasoning, Problem solving, Initiation OT - Cognition Comments: remains internally and externally easily distracted, needing frequent redirection; remains with poor working memory and rather tangential. has some attention seeking behaviors                 Following commands: Impaired Following commands impaired: Follows one step commands with increased time, Follows multi-step commands inconsistently      Cueing   Cueing Techniques: Verbal cues, Visual cues  Exercises      Shoulder Instructions       General Comments      Pertinent Vitals/ Pain       Pain Assessment Pain Assessment: Faces Faces Pain Scale: Hurts little more Pain Location: right hip Pain Descriptors / Indicators: Grimacing, Guarding Pain Intervention(s): Limited activity within patient's tolerance, Monitored during session  Home Living                                          Prior Functioning/Environment              Frequency  Min 1X/week        Progress Toward Goals  OT Goals(current goals can now be found in the care plan section)  Progress towards OT goals: Progressing toward goals     Plan      Co-evaluation                 AM-PAC OT 6 Clicks Daily Activity     Outcome Measure   Help from another person eating meals?: None Help from another person taking care of personal grooming?: A Little Help from another person toileting, which includes using toliet, bedpan, or urinal?: A Little Help from another person bathing (including washing, rinsing, drying)?: A Little Help from another person to put on and taking off regular upper body clothing?: A Little Help from another person to put on and taking off regular lower body clothing?: A Little 6 Click Score: 19    End of Session Equipment Utilized During Treatment: Gait belt;Rolling walker (2 wheels)  OT Visit  Diagnosis: Other abnormalities of gait and mobility (R26.89);Muscle weakness (generalized) (M62.81);Pain;Other symptoms and signs involving cognitive function Pain - Right/Left: Right Pain - part of body: Hip   Activity Tolerance Patient tolerated treatment well   Patient Left in bed;with call bell/phone within reach;with bed alarm set   Nurse Communication Mobility status        Time: 1403-1500 OT Time Calculation (min): 57 min  Charges: OT General Charges $OT Visit: 1 Visit OT Treatments $Self Care/Home Management : 23-37 mins $Therapeutic Activity: 8-22 mins  Elma JONETTA Penner, OTD, OTR/L [facility] Office: [phone]   Elma JONETTA Penner 12/25/2023, 4:37 PM

## 2023-12-25 NOTE — Plan of Care (Signed)

## 2023-12-25 NOTE — Progress Notes (Signed)
 PROGRESS NOTE  Joan Wood  DOB: 1970-10-22  PCP: Edman Meade PEDLAR, FNP FMW:[id number]  DOA: 10/03/2023  LOS: 83 days  Hospital Day: 84  Subjective: Patient was seen and examined this morning. Sitting up in chair.  Not in distress.  In the last 48 hours, has had low-grade temperature, hemodynamically stable, breathing on room air Most recent labs from 12/14  Brief narrative: Joan Wood is a 53 y.o. female with PMH significant for decompensated liver cirrhosis, PUD, gastric ulcer perforation s/p ex lap, GERD, pancreatitis, OSA  9/24, patient presented to the ED with coffee-ground emesis, bloody diarrhea.   EGD showed nonbleeding gastric ulcer, esophagitis and concern for possible esophageal necrosis.  Hospitalization was complicated by hepatic encephalopathy, treated with lactulose  and rifaximin .  Patient also required multiple paracentesis for her recurrent ascites.  Recent MELD of 35 with correlates to roughly 50% 3 month mortality. Difficult to place  Assessment and plan: Acute upper GI bleeding Acute blood loss anemia  EGD 10/04/2023 showed nonbleeding gastric ulcer, grade D esophagitis and esophageal necrosis Currently on Protonix  40 mg twice daily.   Completed 2 weeks course of Carafate .   Received IV Rocephin  earlier in the course for SBP prophylaxis Patient received 4 units of packed RBC during this hospitalization.  Hemoglobin stable close to 9 in last several checks. Need to follow-up outpatient with GI.   Recent Labs    04/11/23 1455 05/11/23 1512 05/12/23 0930 05/12/23 0940 05/12/23 0943 05/13/23 0222 08/06/23 0419 08/07/23 0302 12/12/23 0509 12/15/23 0616 12/15/23 0617 12/18/23 0502 12/20/23 0306 12/23/23 0639  HGB 13.2   < >  --    < >  --    < > 7.8*   < > 8.2* 9.5*  --  8.5* 8.4* 9.2*  MCV 97   < >  --    < >  --    < > 92.8   < > 94.4 95.2  --  92.5 94.7 93.2  VITAMINB12  --   --  704  --   --   --  757  --   --   --  1,255*  --   --   --    FOLATE  --   --   --   --  13.6  --  6.5  --   --   --  >20.0  --   --   --   FERRITIN 165*  --  266  --   --   --   --   --   --  138  --   --   --   --   TIBC 373  --  317  --   --   --  310  --   --  318  --   --   --   --   IRON 73  --  119  --   --   --  8*  --   --  38  --   --   --   --   RETICCTPCT  --   --   --   --  3.0  --   --   --   --   --  2.8  --   --   --    < > = values in this interval not displayed.   Decompensated alcoholic cirrhosis  Hepatic encephalopathy  Patient was initially treated with rifaximin  and lactulose  for encephalopathy.   At this time,  only lactulose  has been continued since rifaximin  will not be covered at the outpatient facility.   Ammonia level elevated at 59 on last check on 12/11..  Mental status seems stable though. Currently on lactulose  30 g 3 times daily. Continue to monitor. Repeat ammonia in next few days. Recent Labs  Lab 12/20/23 0306 12/23/23 0639  AST 71* 64*  ALT 38 34  ALKPHOS 73 84  BILITOT 1.2 1.4*  PROT 6.3* 6.6  ALBUMIN  1.9* 1.9*  AMMONIA 59*  --   PLT 139* 144*   Ascites requiring recurrent paracentesis Has undergone paracentesis 11/3, 11/15, 11/28, 12/7 with removal of at least a liter of fluid each time.  12/11, abdomen was noted to be distended.  Ultrasound paracentesis was requested but apparently she was only noted to have very small pocket of fluid Previous hospitalist spoke with IR about potential peritoneal drain placement since she had it in the past.  Recommended peritoneal drain if going to SNF as patient would not be able to manage the drain on her own without assistance.  Needs IR consult for peritoneal drain closer to discharge.  Lower extremity edema Chronic hypotension Secondary to liver cirrhosis and portal hypertension 10/12/2023, venous duplex ruled out DVT Currently on oral Lasix  40 mg twice daily, midodrine  20 mg 3 times daily  Hypokalemia, hypomagnesemia Chronically low on potassium with  Lasix . 12/14, I have started KCl 40 mEq daily Recent Labs  Lab 12/20/23 0306 12/23/23 0639  K 3.3* 3.3*  MG 1.7  --   PHOS 4.2  --    Chronic mild hyponatremia Hypovolemic hyponatremia secondary to decompensated liver cirrhosis. Sodium lower at 127 today.  Recommended to limit free water intake which she has been inconsistent with. Repeat labs in the next few days. Recent Labs  Lab 12/20/23 0306 12/23/23 0639  NA 129* 127*   Enterobacter UTI Completed the course of antibiotics with Bactrim  DS twice daily   Acute appendicitis Noted on CT 10/2.   General surgery was consulted and patient was thought to be high surgical risk so was treated conservatively with antibiotics.  No further abdominal pain.   In-hospital fall Impaired mobility 11/30, pt was found on ground in feces Bilateral hip and ankle xrays did not show acute fractures. Seen by PT.  SNF recommended  Low back pain with right-sided radiculopathy Peripheral neuropathy Continues to complain of low back pain and right hip pain radiating to the leg to ankles.  12/8, MRI of the L-spine showed abnormal bone marrow edema involving the right sacral ala concerning for fracture, recommended CT pelvis.  Moderate spinal canal stenosis at L4-L5 with crowding of cauda equina nerve roots.   12/9, CT pelvis showed a nondisplaced right sacral ala fracture 12/12, because of the intensity and frequency of pain, I scheduled her on oxycodone  5 mg every 8 hours Pain regimen --- Scheduled: Neurontin  600 mg 3 times daily, oxycodone  5 mg every 8 hours. --- PRN: IV Dilaudid ,   Gout Had an acute gout flareup 11/22/2023.  Treated with colchicine  and prednisone . Nearing the end of prednisone  tapering course   Insomnia Continue Seroquel , melatonin nightly as needed   Chronic smoking  Counseling done to quit   Chronic alcohol  use No active issues.   Continue multivitamin and thiamine .   No signs of withdrawal in the hospital    Severe malnutrition  Nutrition Problem: Severe Malnutrition Etiology: chronic illness (cirrhosis) Signs/Symptoms: severe fat depletion, severe muscle depletion Interventions: Ensure Enlive (each supplement provides 350kcal and 20  grams of protein), MVI, Liberalize Diet, Magic cup   Goals of care   Code Status: Full Code     DVT prophylaxis:  SCDs Start: 10/03/23 2153   Antimicrobials: None currently Fluid: None Consultants: IR Family Communication: None at bedside  Status: Inpatient Level of care:  Progressive   Patient is from: Home Needs to continue in-hospital care: Difficult to place Anticipated d/c to: [facility] when placement is found..  Also needs peritoneal drain placement prior to discharge      Diet:  Diet Order             Diet 2 gram sodium Room service appropriate? Yes with Assist; Fluid consistency: Thin; Fluid restriction: 1500 mL Fluid  Diet effective now                   Scheduled Meds:  sodium chloride    Intravenous Once   calcium  carbonate  1 tablet Oral TID WC   feeding supplement  237 mL Oral TID BM   folic acid   1 mg Oral Daily   furosemide   40 mg Oral BID   gabapentin   600 mg Oral TID   lactulose   30 g Oral TID   lidocaine   2 patch Transdermal Q24H   lidocaine  (PF)  10 mL Other Once   midodrine   20 mg Oral TID WC   multivitamin with minerals  1 tablet Oral Daily   nutrition supplement (JUVEN)  1 packet Oral BID BM   oxyCODONE   5 mg Oral Q8H   pantoprazole   40 mg Oral BID   potassium chloride   40 mEq Oral Daily   QUEtiapine   25 mg Oral QHS   thiamine   100 mg Oral Daily   triamcinolone  0.1 % cream : eucerin   Topical Daily    PRN meds: albuterol , alum & mag hydroxide-simeth, antiseptic oral rinse, artificial tears, benzonatate , HYDROmorphone  (DILAUDID ) injection, hydrOXYzine , melatonin, Muscle Rub, ondansetron , mouth rinse, phenol, prochlorperazine , simethicone , witch hazel-glycerin    Infusions:     Antimicrobials: Anti-infectives (From admission, onward)    Start     Dose/Rate Route Frequency Ordered Stop   12/06/23 1930  fluconazole  (DIFLUCAN ) tablet 150 mg        150 mg Oral  Once 12/06/23 1831 12/06/23 1905   11/21/23 1115  sulfamethoxazole -trimethoprim  (BACTRIM  DS) 800-160 MG per tablet 1 tablet        1 tablet Oral Every 12 hours 11/21/23 1024 11/23/23 2117   11/20/23 1400  cefTRIAXone  (ROCEPHIN ) 1 g in sodium chloride  0.9 % 100 mL IVPB  Status:  Discontinued        1 g 200 mL/hr over 30 Minutes Intravenous Every 24 hours 11/20/23 1245 11/21/23 1024   11/12/23 1145  rifaximin  (XIFAXAN ) tablet 550 mg  Status:  Discontinued        550 mg Oral 2 times daily 11/12/23 1058 11/22/23 1628   10/29/23 0000  rifaximin  (XIFAXAN ) 550 MG TABS tablet        550 mg Oral 2 times daily 10/29/23 1129     10/25/23 1415  rifaximin  (XIFAXAN ) tablet 550 mg  Status:  Discontinued        550 mg Oral 2 times daily 10/25/23 1315 11/10/23 1244   10/12/23 1000  metroNIDAZOLE  (FLAGYL ) tablet 500 mg        500 mg Oral Every 12 hours 10/12/23 0734 10/22/23 2125   10/12/23 0830  cefTRIAXone  (ROCEPHIN ) 2 g in sodium chloride  0.9 % 100 mL IVPB  2 g 200 mL/hr over 30 Minutes Intravenous Every 24 hours 10/12/23 0734 10/22/23 1636   10/07/23 1000  fluconazole  (DIFLUCAN ) tablet 100 mg  Status:  Discontinued        100 mg Oral Daily 10/07/23 0853 10/07/23 0855   10/07/23 1000  fluconazole  (DIFLUCAN ) tablet 150 mg        150 mg Oral Daily 10/07/23 0855 10/07/23 1014   10/04/23 1000  cefTRIAXone  (ROCEPHIN ) 2 g in sodium chloride  0.9 % 100 mL IVPB        2 g 200 mL/hr over 30 Minutes Intravenous Every 24 hours 10/03/23 2154 10/08/23 0854   10/03/23 2015  cefTRIAXone  (ROCEPHIN ) 1 g in sodium chloride  0.9 % 100 mL IVPB        1 g 200 mL/hr over 30 Minutes Intravenous  Once 10/03/23 2010 10/03/23 2212       Objective: Vitals:   12/25/23 0825 12/25/23 1142  BP: 114/65 121/71  Pulse: (!) 104 98   Resp: 19 19  Temp: 100.2 F (37.9 C) 98.2 F (36.8 C)  SpO2: 100% 100%    Intake/Output Summary (Last 24 hours) at 12/25/2023 1351 Last data filed at 12/24/2023 1704 Gross per 24 hour  Intake 240 ml  Output --  Net 240 ml   Filed Weights   12/23/23 0500 12/24/23 0625 12/25/23 0500  Weight: 61 kg 61.8 kg 61.9 kg   Weight change: 0.12 kg Body mass index is 24.96 kg/m.   Physical Exam: General exam: Pleasant, middle-aged Caucasian female Skin: No rashes, lesions or ulcers. HEENT: Atraumatic, normocephalic, no obvious bleeding Lungs: Clear to auscultation bilaterally,  CVS: S1, S2, no murmur,   GI/Abd: Soft, nontender, looks distended, bowel sound present,   CNS: Alert, awake, slow to respond oriented x 3 Psychiatry: Mood appropriate Extremities: trace pedal edema, no calf tenderness,   Data Review: I have personally reviewed the laboratory data and studies available.  F/u labs ordered. Unresulted Labs (From admission, onward)    None       Signed, Chapman Rota, MD [facility] 12/25/2023

## 2023-12-26 DIAGNOSIS — K7460 Unspecified cirrhosis of liver: Secondary | ICD-10-CM | POA: Diagnosis not present

## 2023-12-26 DIAGNOSIS — K7290 Hepatic failure, unspecified without coma: Secondary | ICD-10-CM | POA: Diagnosis not present

## 2023-12-26 NOTE — Progress Notes (Signed)
 PROGRESS NOTE  Joan Wood  DOB: Jan 07, 1971  PCP: Edman Meade PEDLAR, FNP FMW:[id number]  DOA: 10/03/2023  LOS: 84 days  Hospital Day: 85  Subjective: Patient was seen and examined this morning. Sitting up in bed not in distress.  No new symptoms. Afebrile, hemoglobin stable, breathing room air Repeat labs tomorrow  Brief narrative: Joan Wood is a 53 y.o. female with PMH significant for decompensated liver cirrhosis, PUD, gastric ulcer perforation s/p ex lap, GERD, pancreatitis, OSA  9/24, patient presented to the ED with coffee-ground emesis, bloody diarrhea.   EGD showed nonbleeding gastric ulcer, esophagitis and concern for possible esophageal necrosis.  Hospitalization was complicated by hepatic encephalopathy, treated with lactulose  and rifaximin .  Patient also required multiple paracentesis for her recurrent ascites.  Recent MELD of 35 with correlates to roughly 50% 3 month mortality. Difficult to place  Assessment and plan: Acute upper GI bleeding Acute blood loss anemia  EGD 10/04/2023 showed nonbleeding gastric ulcer, grade D esophagitis and esophageal necrosis Currently on Protonix  40 mg twice daily.   Completed 2 weeks course of Carafate .   Received IV Rocephin  earlier in the course for SBP prophylaxis Patient received 4 units of packed RBC during this hospitalization.  Hemoglobin stable close to 9 in last several checks. Need to follow-up outpatient with GI.   Recent Labs    04/11/23 1455 05/11/23 1512 05/12/23 0930 05/12/23 0940 05/12/23 0943 05/13/23 0222 08/06/23 0419 08/07/23 0302 12/12/23 0509 12/15/23 0616 12/15/23 0617 12/18/23 0502 12/20/23 0306 12/23/23 0639  HGB 13.2   < >  --    < >  --    < > 7.8*   < > 8.2* 9.5*  --  8.5* 8.4* 9.2*  MCV 97   < >  --    < >  --    < > 92.8   < > 94.4 95.2  --  92.5 94.7 93.2  VITAMINB12  --   --  704  --   --   --  757  --   --   --  1,255*  --   --   --   FOLATE  --   --   --   --  13.6  --  6.5  --    --   --  >20.0  --   --   --   FERRITIN 165*  --  266  --   --   --   --   --   --  138  --   --   --   --   TIBC 373  --  317  --   --   --  310  --   --  318  --   --   --   --   IRON 73  --  119  --   --   --  8*  --   --  38  --   --   --   --   RETICCTPCT  --   --   --   --  3.0  --   --   --   --   --  2.8  --   --   --    < > = values in this interval not displayed.   Decompensated alcoholic cirrhosis  Hepatic encephalopathy  Patient was initially treated with rifaximin  and lactulose  for encephalopathy.   At this time, only lactulose  has been continued since rifaximin  will not  be covered at the outpatient facility.   Ammonia level elevated at 59 on last check on 12/11..  Mental status seems stable though. Currently on lactulose  30 g 3 times daily. Continue to monitor bowel movement to titrate the dose and frequency of lactulose . Repeat ammonia in next few days. Recent Labs  Lab 12/20/23 0306 12/23/23 0639  AST 71* 64*  ALT 38 34  ALKPHOS 73 84  BILITOT 1.2 1.4*  PROT 6.3* 6.6  ALBUMIN  1.9* 1.9*  AMMONIA 59*  --   PLT 139* 144*   Ascites requiring recurrent paracentesis Has undergone paracentesis 11/3, 11/15, 11/28, 12/7 with removal of at least a liter of fluid each time.  12/11, abdomen was noted to be distended.  Ultrasound paracentesis was requested but apparently she was only noted to have very small pocket of fluid Previous hospitalist spoke with IR about potential peritoneal drain placement since she had it in the past.  Recommended peritoneal drain if going to SNF as patient would not be able to manage the drain on her own without assistance.  Needs IR consult for peritoneal drain closer to discharge.  Lower extremity edema Chronic hypotension Secondary to liver cirrhosis and portal hypertension 10/12/2023, venous duplex ruled out DVT Currently on oral Lasix  40 mg twice daily, midodrine  20 mg 3 times daily  Hypokalemia, hypomagnesemia Chronically low on potassium  with Lasix . 12/14, I have started KCl 40 mEq daily Recent Labs  Lab 12/20/23 0306 12/23/23 0639  K 3.3* 3.3*  MG 1.7  --   PHOS 4.2  --    Chronic mild hyponatremia Hypovolemic hyponatremia secondary to decompensated liver cirrhosis. Sodium lower at 127 today.  Recommended to limit free water intake which she has been inconsistent with. Repeat labs in the next few days. Recent Labs  Lab 12/20/23 0306 12/23/23 0639  NA 129* 127*   Enterobacter UTI Completed the course of antibiotics with Bactrim  DS twice daily   Acute appendicitis Noted on CT 10/2.   General surgery was consulted and patient was thought to be high surgical risk so was treated conservatively with antibiotics.  No further abdominal pain.   In-hospital fall Impaired mobility 11/30, pt was found on ground in feces Bilateral hip and ankle xrays did not show acute fractures. Seen by PT.  SNF recommended  Low back pain with right-sided radiculopathy Peripheral neuropathy Continues to complain of low back pain and right hip pain radiating to the leg to ankles.  12/8, MRI of the L-spine showed abnormal bone marrow edema involving the right sacral ala concerning for fracture, recommended CT pelvis.  Moderate spinal canal stenosis at L4-L5 with crowding of cauda equina nerve roots.   12/9, CT pelvis showed a nondisplaced right sacral ala fracture 12/12, because of the intensity and frequency of pain, I scheduled her on oxycodone  5 mg every 8 hours Pain regimen --- Scheduled: Neurontin  600 mg 3 times daily, oxycodone  5 mg every 8 hours. --- PRN: IV Dilaudid ,   Gout Had an acute gout flareup 11/22/2023.  Treated with colchicine  and prednisone . Nearing the end of prednisone  tapering course   Insomnia Continue Seroquel , melatonin nightly as needed   Chronic smoking  Counseling done to quit   Chronic alcohol  use No active issues.   Continue multivitamin and thiamine .   No signs of withdrawal in the hospital    Severe malnutrition  Nutrition Problem: Severe Malnutrition Etiology: chronic illness (cirrhosis) Signs/Symptoms: severe fat depletion, severe muscle depletion Interventions: Ensure Enlive (each supplement provides 350kcal and  20 grams of protein), MVI, Liberalize Diet, Magic cup   Goals of care   Code Status: Full Code     DVT prophylaxis:  SCDs Start: 10/03/23 2153   Antimicrobials: None currently Fluid: None Consultants: IR Family Communication: None at bedside  Status: Inpatient Level of care:  Progressive   Patient is from: Home Needs to continue in-hospital care: Difficult to place Anticipated d/c to: [facility] when placement is found..  Also needs peritoneal drain placement prior to discharge      Diet:  Diet Order             Diet 2 gram sodium Room service appropriate? Yes with Assist; Fluid consistency: Thin; Fluid restriction: 1500 mL Fluid  Diet effective now                   Scheduled Meds:  sodium chloride    Intravenous Once   calcium  carbonate  1 tablet Oral TID WC   feeding supplement  237 mL Oral TID BM   folic acid   1 mg Oral Daily   furosemide   40 mg Oral BID   gabapentin   600 mg Oral TID   lactulose   30 g Oral TID   lidocaine   2 patch Transdermal Q24H   lidocaine  (PF)  10 mL Other Once   midodrine   20 mg Oral TID WC   multivitamin with minerals  1 tablet Oral Daily   nutrition supplement (JUVEN)  1 packet Oral BID BM   oxyCODONE   5 mg Oral Q8H   pantoprazole   40 mg Oral BID   potassium chloride   40 mEq Oral Daily   QUEtiapine   25 mg Oral QHS   thiamine   100 mg Oral Daily   triamcinolone  0.1 % cream : eucerin   Topical Daily    PRN meds: albuterol , alum & mag hydroxide-simeth, antiseptic oral rinse, artificial tears, benzonatate , HYDROmorphone  (DILAUDID ) injection, hydrOXYzine , melatonin, Muscle Rub, ondansetron , mouth rinse, phenol, prochlorperazine , simethicone , witch hazel-glycerin    Infusions:     Antimicrobials: Anti-infectives (From admission, onward)    Start     Dose/Rate Route Frequency Ordered Stop   12/06/23 1930  fluconazole  (DIFLUCAN ) tablet 150 mg        150 mg Oral  Once 12/06/23 1831 12/06/23 1905   11/21/23 1115  sulfamethoxazole -trimethoprim  (BACTRIM  DS) 800-160 MG per tablet 1 tablet        1 tablet Oral Every 12 hours 11/21/23 1024 11/23/23 2117   11/20/23 1400  cefTRIAXone  (ROCEPHIN ) 1 g in sodium chloride  0.9 % 100 mL IVPB  Status:  Discontinued        1 g 200 mL/hr over 30 Minutes Intravenous Every 24 hours 11/20/23 1245 11/21/23 1024   11/12/23 1145  rifaximin  (XIFAXAN ) tablet 550 mg  Status:  Discontinued        550 mg Oral 2 times daily 11/12/23 1058 11/22/23 1628   10/29/23 0000  rifaximin  (XIFAXAN ) 550 MG TABS tablet        550 mg Oral 2 times daily 10/29/23 1129     10/25/23 1415  rifaximin  (XIFAXAN ) tablet 550 mg  Status:  Discontinued        550 mg Oral 2 times daily 10/25/23 1315 11/10/23 1244   10/12/23 1000  metroNIDAZOLE  (FLAGYL ) tablet 500 mg        500 mg Oral Every 12 hours 10/12/23 0734 10/22/23 2125   10/12/23 0830  cefTRIAXone  (ROCEPHIN ) 2 g in sodium chloride  0.9 % 100 mL IVPB  2 g 200 mL/hr over 30 Minutes Intravenous Every 24 hours 10/12/23 0734 10/22/23 1636   10/07/23 1000  fluconazole  (DIFLUCAN ) tablet 100 mg  Status:  Discontinued        100 mg Oral Daily 10/07/23 0853 10/07/23 0855   10/07/23 1000  fluconazole  (DIFLUCAN ) tablet 150 mg        150 mg Oral Daily 10/07/23 0855 10/07/23 1014   10/04/23 1000  cefTRIAXone  (ROCEPHIN ) 2 g in sodium chloride  0.9 % 100 mL IVPB        2 g 200 mL/hr over 30 Minutes Intravenous Every 24 hours 10/03/23 2154 10/08/23 0854   10/03/23 2015  cefTRIAXone  (ROCEPHIN ) 1 g in sodium chloride  0.9 % 100 mL IVPB        1 g 200 mL/hr over 30 Minutes Intravenous  Once 10/03/23 2010 10/03/23 2212       Objective: Vitals:   12/26/23 1512 12/26/23 1557  BP: 126/73 119/71  Pulse: (!) 106 98   Resp: 20 18  Temp: 99.6 F (37.6 C) 98.4 F (36.9 C)  SpO2: 99% 100%    Intake/Output Summary (Last 24 hours) at 12/26/2023 1646 Last data filed at 12/26/2023 1430 Gross per 24 hour  Intake 1320 ml  Output --  Net 1320 ml   Filed Weights   12/23/23 0500 12/24/23 0625 12/25/23 0500  Weight: 61 kg 61.8 kg 61.9 kg   Weight change:  Body mass index is 24.96 kg/m.   Physical Exam: General exam: Pleasant, middle-aged Caucasian female Skin: No rashes, lesions or ulcers. HEENT: Atraumatic, normocephalic, no obvious bleeding Lungs: Clear to auscultation bilaterally,  CVS: S1, S2, no murmur,   GI/Abd: Soft, nontender, looks distended, bowel sound present,   CNS: Alert, awake, slow to respond oriented x 3 Psychiatry: Mood appropriate Extremities: trace pedal edema, no calf tenderness,   Data Review: I have personally reviewed the laboratory data and studies available.  F/u labs ordered. Unresulted Labs (From admission, onward)     Start     Ordered   12/27/23 0500  CBC with Differential/Platelet  Tomorrow morning,   R       Question:  Specimen collection method  Answer:  Lab=Lab collect   12/26/23 0832   12/27/23 0500  Comprehensive metabolic panel with GFR  Tomorrow morning,   R       Question:  Specimen collection method  Answer:  Lab=Lab collect   12/26/23 0832   12/27/23 0500  Ammonia  Tomorrow morning,   R       Question:  Specimen collection method  Answer:  Lab=Lab collect   12/26/23 9167            Signed, Chapman Rota, MD [facility] 12/26/2023

## 2023-12-26 NOTE — Plan of Care (Signed)
°  Problem: Health Behavior/Discharge Planning: Goal: Ability to manage health-related needs will improve Outcome: Not Progressing   Problem: Clinical Measurements: Goal: Ability to maintain clinical measurements within normal limits will improve Outcome: Progressing Goal: Will remain free from infection Outcome: Progressing Goal: Diagnostic test results will improve Outcome: Progressing Goal: Respiratory complications will improve Outcome: Progressing Goal: Cardiovascular complication will be avoided Outcome: Progressing   Problem: Activity: Goal: Risk for activity intolerance will decrease Outcome: Progressing   Problem: Nutrition: Goal: Adequate nutrition will be maintained Outcome: Progressing   Problem: Elimination: Goal: Will not experience complications related to bowel motility Outcome: Progressing

## 2023-12-26 NOTE — Plan of Care (Signed)
   Problem: Activity: Goal: Risk for activity intolerance will decrease Outcome: Progressing   Problem: Pain Managment: Goal: General experience of comfort will improve and/or be controlled Outcome: Progressing   Problem: Safety: Goal: Ability to remain free from injury will improve Outcome: Progressing

## 2023-12-27 ENCOUNTER — Inpatient Hospital Stay (HOSPITAL_COMMUNITY)

## 2023-12-27 DIAGNOSIS — K7290 Hepatic failure, unspecified without coma: Secondary | ICD-10-CM | POA: Diagnosis not present

## 2023-12-27 DIAGNOSIS — K7460 Unspecified cirrhosis of liver: Secondary | ICD-10-CM | POA: Diagnosis not present

## 2023-12-27 HISTORY — PX: IR PARACENTESIS: IMG2679

## 2023-12-27 LAB — CBC WITH DIFFERENTIAL/PLATELET
Abs Immature Granulocytes: 0.03 K/uL (ref 0.00–0.07)
Basophils Absolute: 0 K/uL (ref 0.0–0.1)
Basophils Relative: 0 %
Eosinophils Absolute: 0.1 K/uL (ref 0.0–0.5)
Eosinophils Relative: 3 %
HCT: 24.8 % — ABNORMAL LOW (ref 36.0–46.0)
Hemoglobin: 8.2 g/dL — ABNORMAL LOW (ref 12.0–15.0)
Immature Granulocytes: 1 %
Lymphocytes Relative: 17 %
Lymphs Abs: 0.9 K/uL (ref 0.7–4.0)
MCH: 31.1 pg (ref 26.0–34.0)
MCHC: 33.1 g/dL (ref 30.0–36.0)
MCV: 93.9 fL (ref 80.0–100.0)
Monocytes Absolute: 1.1 K/uL — ABNORMAL HIGH (ref 0.1–1.0)
Monocytes Relative: 21 %
Neutro Abs: 3 K/uL (ref 1.7–7.7)
Neutrophils Relative %: 58 %
Platelets: 131 K/uL — ABNORMAL LOW (ref 150–400)
RBC: 2.64 MIL/uL — ABNORMAL LOW (ref 3.87–5.11)
RDW: 15.2 % (ref 11.5–15.5)
Smear Review: NORMAL
WBC: 5.1 K/uL (ref 4.0–10.5)
nRBC: 0 % (ref 0.0–0.2)

## 2023-12-27 LAB — COMPREHENSIVE METABOLIC PANEL WITH GFR
ALT: 26 U/L (ref 0–44)
AST: 57 U/L — ABNORMAL HIGH (ref 15–41)
Albumin: 2.3 g/dL — ABNORMAL LOW (ref 3.5–5.0)
Alkaline Phosphatase: 106 U/L (ref 38–126)
Anion gap: 5 (ref 5–15)
BUN: 22 mg/dL — ABNORMAL HIGH (ref 6–20)
CO2: 25 mmol/L (ref 22–32)
Calcium: 8.2 mg/dL — ABNORMAL LOW (ref 8.9–10.3)
Chloride: 96 mmol/L — ABNORMAL LOW (ref 98–111)
Creatinine, Ser: 0.73 mg/dL (ref 0.44–1.00)
GFR, Estimated: >60 mL/min (ref >60)
Glucose, Bld: 99 mg/dL (ref 70–99)
Potassium: 3.9 mmol/L (ref 3.5–5.1)
Sodium: 127 mmol/L — ABNORMAL LOW (ref 135–145)
Total Bilirubin: 0.9 mg/dL (ref 0.0–1.2)
Total Protein: 6.3 g/dL — ABNORMAL LOW (ref 6.5–8.1)

## 2023-12-27 LAB — AMMONIA: Ammonia: 43 umol/L — ABNORMAL HIGH (ref 9–35)

## 2023-12-27 MED ORDER — LIDOCAINE-EPINEPHRINE 1 %-1:100000 IJ SOLN
INTRAMUSCULAR | Status: AC
  Filled 2023-12-27: qty 20

## 2023-12-27 MED ORDER — LIDOCAINE HCL 1 % IJ SOLN
20.0000 mL | Freq: Once | INTRAMUSCULAR | Status: AC
  Administered 2023-12-27: 14:00:00 5 mL via INTRADERMAL

## 2023-12-27 NOTE — Progress Notes (Signed)
 PROGRESS NOTE  Joan Wood  DOB: [birth year]  PCP: Edman Meade PEDLAR, FNP FMW:[id number]  DOA: 10/03/2023  LOS: 85 days  Hospital Day: 86  Subjective: Patient was seen and examined this morning.  Sitting up in bed.  Not in distress Has huige abdominal distention Afebrile, heart rate 90s and 100s, blood pressure stable, breathing on room air  Brief narrative: Joan Wood is a 53 y.o. female with PMH significant for decompensated liver cirrhosis, PUD, gastric ulcer perforation s/p ex lap, GERD, pancreatitis, OSA  9/24, patient presented to the ED with coffee-ground emesis, bloody diarrhea.   EGD showed nonbleeding gastric ulcer, esophagitis and concern for possible esophageal necrosis.  Hospitalization was complicated by hepatic encephalopathy, treated with lactulose  and rifaximin .  Patient also required multiple paracentesis for her recurrent ascites.  Recent MELD of 35 with correlates to roughly 50% 3 month mortality. Difficult to place  Assessment and plan: Acute upper GI bleeding Acute blood loss anemia  EGD 10/04/2023 showed nonbleeding gastric ulcer, grade D esophagitis and esophageal necrosis Currently on Protonix  40 mg twice daily.   Completed 2 weeks course of Carafate .   Received IV Rocephin  earlier in the course for SBP prophylaxis Patient received 4 units of packed RBC during this hospitalization.  Hemoglobin stable between 8 and 9. Need to follow-up outpatient with GI.   Recent Labs    04/11/23 1455 05/11/23 1512 05/12/23 0930 05/12/23 0940 05/12/23 9056 05/13/23 0222 08/06/23 0419 08/07/23 0302 12/15/23 0616 12/15/23 0617 12/18/23 0502 12/20/23 0306 12/23/23 0639 12/27/23 0331  HGB 13.2   < >  --    < >  --    < > 7.8*   < > 9.5*  --  8.5* 8.4* 9.2* 8.2*  MCV 97   < >  --    < >  --    < > 92.8   < > 95.2  --  92.5 94.7 93.2 93.9  VITAMINB12  --   --  704  --   --   --  757  --   --  1,255*  --   --   --   --   FOLATE  --   --   --   --  13.6  --   6.5  --   --  >20.0  --   --   --   --   FERRITIN 165*  --  266  --   --   --   --   --  138  --   --   --   --   --   TIBC 373  --  317  --   --   --  310  --  318  --   --   --   --   --   IRON 73  --  119  --   --   --  8*  --  38  --   --   --   --   --   RETICCTPCT  --   --   --   --  3.0  --   --   --   --  2.8  --   --   --   --    < > = values in this interval not displayed.   Decompensated alcoholic cirrhosis  Hepatic encephalopathy  Patient was initially treated with rifaximin  and lactulose  for encephalopathy.   At this time, only lactulose  has been continued since  rifaximin  will not be covered at the outpatient facility.   Ammonia level better at 43 today 12/18. Continue lactulose  30 g 3 times daily. Continue to monitor bowel movement to titrate the dose and frequency of lactulose  Repeat ammonia in next few days. Recent Labs  Lab 12/23/23 0639 12/27/23 0331  AST 64* 57*  ALT 34 26  ALKPHOS 84 106  BILITOT 1.4* 0.9  PROT 6.6 6.3*  ALBUMIN  1.9* 2.3*  AMMONIA  --  43*  PLT 144* 131*   Ascites requiring recurrent paracentesis Has undergone paracentesis 11/3, 11/15, 11/28, 12/7 with removal of at least a liter of fluid each time.  12/11, abdomen was noted to be distended.  Ultrasound paracentesis was requested but apparently she was only noted to have very small pocket of fluid 12/18, I have reordered ultrasound paracentesis today Previous hospitalist spoke with IR about potential peritoneal drain placement since she had it in the past.  Recommended peritoneal drain if going to SNF as patient would not be able to manage the drain on her own without assistance.  Needs IR consult for peritoneal drain closer to discharge.  Lower extremity edema Chronic hypotension Secondary to liver cirrhosis and portal hypertension 10/12/2023, venous duplex ruled out DVT Currently on oral Lasix  40 mg twice daily, midodrine  20 mg 3 times daily  Hypokalemia, hypomagnesemia Chronically low on  potassium with Lasix . Currently on KCl 40 mEq daily since 12/14. Recent Labs  Lab 12/23/23 0639 12/27/23 0331  K 3.3* 3.9   Chronic mild hyponatremia Hypovolemic hyponatremia secondary to decompensated liver cirrhosis. Sodium level low but is stable at 127.  Recommended to limit free water intake which she has been inconsistent with. Repeat labs in the next few days. Recent Labs  Lab 12/23/23 0639 12/27/23 0331  NA 127* 127*   Enterobacter UTI Completed the course of antibiotics with Bactrim  DS twice daily   Acute appendicitis Noted on CT 10/2.   General surgery was consulted and patient was thought to be high surgical risk so was treated conservatively with antibiotics.  No further abdominal pain.   In-hospital fall Impaired mobility 11/30, pt was found on ground in feces Bilateral hip and ankle xrays did not show acute fractures. Seen by PT.  SNF recommended  Low back pain with right-sided radiculopathy Peripheral neuropathy Continues to complain of low back pain and right hip pain radiating to the leg to ankles.  12/8, MRI of the L-spine showed abnormal bone marrow edema involving the right sacral ala concerning for fracture, recommended CT pelvis.  Moderate spinal canal stenosis at L4-L5 with crowding of cauda equina nerve roots.   12/9, CT pelvis showed a nondisplaced right sacral ala fracture 12/12, because of the intensity and frequency of pain, I scheduled her on oxycodone  5 mg every 8 hours Pain regimen --- Scheduled: Neurontin  600 mg 3 times daily, oxycodone  5 mg every 8 hours. --- PRN: IV Dilaudid ,   Gout Had an acute gout flareup 11/22/2023.  Treated with colchicine  and prednisone . Nearing the end of prednisone  tapering course   Insomnia Continue Seroquel , melatonin nightly as needed   Chronic smoking  Counseling done to quit   Chronic alcohol  use No active issues.   Continue multivitamin and thiamine .   No signs of withdrawal in the hospital    Severe malnutrition  Nutrition Problem: Severe Malnutrition Etiology: chronic illness (cirrhosis) Signs/Symptoms: severe fat depletion, severe muscle depletion Interventions: Ensure Enlive (each supplement provides 350kcal and 20 grams of protein), MVI, Liberalize Diet, Magic cup  Goals of care   Code Status: Full Code     DVT prophylaxis:  SCDs Start: 10/03/23 2153   Antimicrobials: None currently Fluid: None Consultants: IR Family Communication: None at bedside  Status: Inpatient Level of care:  Progressive   Patient is from: Home Needs to continue in-hospital care: Difficult to place Anticipated d/c to: [facility] when placement is found..  Also needs peritoneal drain placement prior to discharge      Diet:  Diet Order             Diet 2 gram sodium Room service appropriate? Yes with Assist; Fluid consistency: Thin; Fluid restriction: 1500 mL Fluid  Diet effective now                   Scheduled Meds:  sodium chloride    Intravenous Once   calcium  carbonate  1 tablet Oral TID WC   feeding supplement  237 mL Oral TID BM   folic acid   1 mg Oral Daily   furosemide   40 mg Oral BID   gabapentin   600 mg Oral TID   lactulose   30 g Oral TID   lidocaine   2 patch Transdermal Q24H   lidocaine  (PF)  10 mL Other Once   midodrine   20 mg Oral TID WC   multivitamin with minerals  1 tablet Oral Daily   nutrition supplement (JUVEN)  1 packet Oral BID BM   oxyCODONE   5 mg Oral Q8H   pantoprazole   40 mg Oral BID   potassium chloride   40 mEq Oral Daily   QUEtiapine   25 mg Oral QHS   thiamine   100 mg Oral Daily   triamcinolone  0.1 % cream : eucerin   Topical Daily    PRN meds: albuterol , alum & mag hydroxide-simeth, antiseptic oral rinse, artificial tears, benzonatate , HYDROmorphone  (DILAUDID ) injection, hydrOXYzine , melatonin, Muscle Rub, ondansetron , mouth rinse, phenol, prochlorperazine , simethicone , witch hazel-glycerin    Infusions:     Antimicrobials: Anti-infectives (From admission, onward)    Start     Dose/Rate Route Frequency Ordered Stop   12/06/23 1930  fluconazole  (DIFLUCAN ) tablet 150 mg        150 mg Oral  Once 12/06/23 1831 12/06/23 1905   11/21/23 1115  sulfamethoxazole -trimethoprim  (BACTRIM  DS) 800-160 MG per tablet 1 tablet        1 tablet Oral Every 12 hours 11/21/23 1024 11/23/23 2117   11/20/23 1400  cefTRIAXone  (ROCEPHIN ) 1 g in sodium chloride  0.9 % 100 mL IVPB  Status:  Discontinued        1 g 200 mL/hr over 30 Minutes Intravenous Every 24 hours 11/20/23 1245 11/21/23 1024   11/12/23 1145  rifaximin  (XIFAXAN ) tablet 550 mg  Status:  Discontinued        550 mg Oral 2 times daily 11/12/23 1058 11/22/23 1628   10/29/23 0000  rifaximin  (XIFAXAN ) 550 MG TABS tablet        550 mg Oral 2 times daily 10/29/23 1129     10/25/23 1415  rifaximin  (XIFAXAN ) tablet 550 mg  Status:  Discontinued        550 mg Oral 2 times daily 10/25/23 1315 11/10/23 1244   10/12/23 1000  metroNIDAZOLE  (FLAGYL ) tablet 500 mg        500 mg Oral Every 12 hours 10/12/23 0734 10/22/23 2125   10/12/23 0830  cefTRIAXone  (ROCEPHIN ) 2 g in sodium chloride  0.9 % 100 mL IVPB        2 g 200 mL/hr over 30  Minutes Intravenous Every 24 hours 10/12/23 0734 10/22/23 1636   10/07/23 1000  fluconazole  (DIFLUCAN ) tablet 100 mg  Status:  Discontinued        100 mg Oral Daily 10/07/23 0853 10/07/23 0855   10/07/23 1000  fluconazole  (DIFLUCAN ) tablet 150 mg        150 mg Oral Daily 10/07/23 0855 10/07/23 1014   10/04/23 1000  cefTRIAXone  (ROCEPHIN ) 2 g in sodium chloride  0.9 % 100 mL IVPB        2 g 200 mL/hr over 30 Minutes Intravenous Every 24 hours 10/03/23 2154 10/08/23 0854   10/03/23 2015  cefTRIAXone  (ROCEPHIN ) 1 g in sodium chloride  0.9 % 100 mL IVPB        1 g 200 mL/hr over 30 Minutes Intravenous  Once 10/03/23 2010 10/03/23 2212       Objective: Vitals:   12/26/23 2359 12/27/23 0727  BP: 118/70 137/81  Pulse: (!) 101 (!) 108   Resp: 17 20  Temp: 98.6 F (37 C) 98.8 F (37.1 C)  SpO2: 99% 100%    Intake/Output Summary (Last 24 hours) at 12/27/2023 1128 Last data filed at 12/27/2023 0300 Gross per 24 hour  Intake 360 ml  Output --  Net 360 ml   Filed Weights   12/23/23 0500 12/24/23 0625 12/25/23 0500  Weight: 61 kg 61.8 kg 61.9 kg   Weight change:  Body mass index is 24.96 kg/m.   Physical Exam: General exam: Pleasant, middle-aged Caucasian female Skin: No rashes, lesions or ulcers. HEENT: Atraumatic, normocephalic, no obvious bleeding Lungs: Clear to auscultation bilaterally,  CVS: S1, S2, no murmur,   GI/Abd: Soft, nontender, looks distended, bowel sound present,   CNS: Alert, awake, slow to respond oriented x 3 Psychiatry: Labile mood.  Tearful suddenly and suddenly back to normal mood Extremities: trace pedal edema, no calf tenderness,   Data Review: I have personally reviewed the laboratory data and studies available.  F/u labs ordered. Unresulted Labs (From admission, onward)    None       Signed, Chapman Rota, MD [facility] 12/27/2023

## 2023-12-27 NOTE — Plan of Care (Signed)

## 2023-12-27 NOTE — Procedures (Signed)
 PROCEDURE SUMMARY:  Successful image-guided paracentesis from the right abdomen.  Yielded 5.4 liters of clear, straw-colored peritoneal fluid.  No immediate complications.  EBL: zero Patient tolerated well.   Please see imaging section of Epic for full dictation.  [facility] LABOR Claude Waldman PA-C 12/27/2023 3:11 PM

## 2023-12-28 DIAGNOSIS — K7460 Unspecified cirrhosis of liver: Secondary | ICD-10-CM | POA: Diagnosis not present

## 2023-12-28 DIAGNOSIS — K7290 Hepatic failure, unspecified without coma: Secondary | ICD-10-CM | POA: Diagnosis not present

## 2023-12-28 MED ORDER — ACYCLOVIR 5 % EX CREA: TOPICAL_CREAM | CUTANEOUS | Status: AC

## 2023-12-28 MED ORDER — ACYCLOVIR 5 % EX CREA
TOPICAL_CREAM | CUTANEOUS | Status: DC
  Filled 2023-12-28 (×2): qty 5

## 2023-12-28 MED ORDER — ERYTHROMYCIN 5 MG/GM OP OINT
TOPICAL_OINTMENT | Freq: Three times a day (TID) | OPHTHALMIC | Status: AC
  Administered 2023-12-28 – 2024-01-02 (×13): 1 via OPHTHALMIC
  Filled 2023-12-28 (×2): qty 1

## 2023-12-28 NOTE — Progress Notes (Signed)
 " PROGRESS NOTE  DECLAN ADAMSON  DOB: [birth year]  PCP: Edman Meade PEDLAR, FNP FMW:[id number]  DOA: 10/03/2023  LOS: 86 days  Hospital Day: 87  Subjective: Patient was seen and examined this morning. Sitting up in bed.  Not in distress. Underwent 5.4 L of clear straw-colored peritoneal fluid drained yesterday. Noticed by RN this morning that she has a stye on her left eye and some cold sores on lower lip. Confirmed them during ground.  Pictures taken  Remains afebrile, blood pressure stable.  Heart rate 90s and 100s consistently for last several days  Brief narrative: Joan Wood is a 53 y.o. female with PMH significant for decompensated liver cirrhosis, PUD, gastric ulcer perforation s/p ex lap, GERD, pancreatitis, OSA  9/24, patient presented to the ED with coffee-ground emesis, bloody diarrhea.   EGD showed nonbleeding gastric ulcer, esophagitis and concern for possible esophageal necrosis.  Hospitalization was complicated by hepatic encephalopathy, treated with lactulose  and rifaximin .  Patient also required multiple paracentesis for her recurrent ascites.  Recent MELD of 35 with correlates to roughly 50% 3 month mortality. Difficult to place  Assessment and plan: Acute upper GI bleeding Acute blood loss anemia  EGD 10/04/2023 showed nonbleeding gastric ulcer, grade D esophagitis and esophageal necrosis Currently on Protonix  40 mg twice daily.   Completed 2 weeks course of Carafate .   Received IV Rocephin  earlier in the course for SBP prophylaxis Patient received 4 units of packed RBC during this hospitalization.  Hemoglobin stable between 8 and 9. Need to follow-up outpatient with GI.   Recent Labs    04/11/23 1455 05/11/23 1512 05/12/23 0930 05/12/23 0940 05/12/23 9056 05/13/23 0222 08/06/23 0419 08/07/23 0302 12/15/23 0616 12/15/23 0617 12/18/23 0502 12/20/23 0306 12/23/23 0639 12/27/23 0331  HGB 13.2   < >  --    < >  --    < > 7.8*   < > 9.5*  --  8.5*  8.4* 9.2* 8.2*  MCV 97   < >  --    < >  --    < > 92.8   < > 95.2  --  92.5 94.7 93.2 93.9  VITAMINB12  --   --  704  --   --   --  757  --   --  1,255*  --   --   --   --   FOLATE  --   --   --   --  13.6  --  6.5  --   --  >20.0  --   --   --   --   FERRITIN 165*  --  266  --   --   --   --   --  138  --   --   --   --   --   TIBC 373  --  317  --   --   --  310  --  318  --   --   --   --   --   IRON 73  --  119  --   --   --  8*  --  38  --   --   --   --   --   RETICCTPCT  --   --   --   --  3.0  --   --   --   --  2.8  --   --   --   --    < > =  values in this interval not displayed.   Decompensated alcoholic cirrhosis  Hepatic encephalopathy  Patient was initially treated with rifaximin  and lactulose  for encephalopathy.   At this time, only lactulose  has been continued since rifaximin  will not be covered at the outpatient facility.   Ammonia level better at 43 today 12/18. Continue lactulose  30 g 3 times daily. Continue to monitor bowel movement to titrate the dose and frequency of lactulose  Repeat ammonia in next few days. Recent Labs  Lab 12/23/23 0639 12/27/23 0331  AST 64* 57*  ALT 34 26  ALKPHOS 84 106  BILITOT 1.4* 0.9  PROT 6.6 6.3*  ALBUMIN  1.9* 2.3*  AMMONIA  --  43*  PLT 144* 131*   Portal hypertension Chronic hypotension Currently on oral Lasix  40 mg twice daily, midodrine  20 mg 3 times daily Avoiding beta-blocker to create blood pressure room for diuresis   Ascites requiring recurrent paracentesis Has undergone paracentesis multiple times - 11/3, 11/15, 11/28, 12/7.  12/18, paracentesis drained 5.4 L of clear straw-colored fluid. Previous hospitalist spoke with IR about potential peritoneal drain placement since she had it in the past.  Recommended peritoneal drain if going to SNF as patient would not be able to manage the drain on her own without assistance.  Needs IR consult for peritoneal drain closer to discharge.  Hypokalemia,  hypomagnesemia Chronically low on potassium with Lasix . Currently on KCl 40 mEq daily since 12/14. Recent Labs  Lab 12/23/23 0639 12/27/23 0331  K 3.3* 3.9   Chronic mild hyponatremia Hypovolemic hyponatremia secondary to decompensated liver cirrhosis. Sodium level low but is stable at 127.  Recommended to limit free water intake which she has been inconsistent with. Repeat labs in the next few days. Recent Labs  Lab 12/23/23 0639 12/27/23 0331  NA 127* 127*   Enterobacter UTI Completed the course of antibiotics with Bactrim  DS twice daily   Acute appendicitis Noted on CT 10/2.   General surgery was consulted and patient was thought to be high surgical risk so was treated conservatively with antibiotics.  No further abdominal pain.   In-hospital fall Impaired mobility 11/30, pt was found on ground in feces Bilateral hip and ankle xrays did not show acute fractures. Seen by PT.  SNF recommended  Low back pain with right-sided radiculopathy Peripheral neuropathy Continues to complain of low back pain and right hip pain radiating to the leg to ankles.  12/8, MRI of the L-spine showed abnormal bone marrow edema involving the right sacral ala concerning for fracture, recommended CT pelvis.  Moderate spinal canal stenosis at L4-L5 with crowding of cauda equina nerve roots.   12/9, CT pelvis showed a nondisplaced right sacral ala fracture 12/12, because of the intensity and frequency of pain, I scheduled her on oxycodone  5 mg every 8 hours Pain regimen --- Scheduled: Neurontin  600 mg 3 times daily, oxycodone  5 mg every 8 hours. --- PRN: IV Dilaudid ,   Gout Had an acute gout flareup 11/22/2023.  Treated with colchicine  and prednisone . Nearing the end of prednisone  tapering course   Insomnia Continue Seroquel , melatonin nightly as needed   Chronic smoking  Counseling done to quit   Chronic alcohol  use Continue multivitamin and thiamine .   No signs of withdrawal in the  hospital  Left eye stye Start erythromycin eye ointment and warm compression  Lower lip cold sores Likely herpes but patient does not complain of any symptoms.  Start acyclovir cream.  If does not improve, may need systemic antibiotic  Severe malnutrition  Nutrition Problem: Severe Malnutrition Etiology: chronic illness (cirrhosis) Signs/Symptoms: severe fat depletion, severe muscle depletion Interventions: Ensure Enlive (each supplement provides 350kcal and 20 grams of protein), MVI, Liberalize Diet, Magic cup   Goals of care   Code Status: Full Code     DVT prophylaxis:  SCDs Start: 10/03/23 2153   Antimicrobials: None currently Fluid: None Consultants: IR Family Communication: None at bedside  Status: Inpatient Level of care:  Med-Surg currently.  Downgrade to MedSurg  Patient is from: Home Needs to continue in-hospital care: Difficult to place Anticipated d/c to: [facility] when placement is found..  Also needs peritoneal drain placement prior to discharge      Diet:  Diet Order             Diet 2 gram sodium Room service appropriate? Yes with Assist; Fluid consistency: Thin; Fluid restriction: 1500 mL Fluid  Diet effective now                   Scheduled Meds:  sodium chloride    Intravenous Once   acyclovir cream   Topical Q3H   calcium  carbonate  1 tablet Oral TID WC   erythromycin   Left Eye Q8H   feeding supplement  237 mL Oral TID BM   folic acid   1 mg Oral Daily   furosemide   40 mg Oral BID   gabapentin   600 mg Oral TID   lactulose   30 g Oral TID   lidocaine   2 patch Transdermal Q24H   lidocaine  (PF)  10 mL Other Once   midodrine   20 mg Oral TID WC   multivitamin with minerals  1 tablet Oral Daily   nutrition supplement (JUVEN)  1 packet Oral BID BM   oxyCODONE   5 mg Oral Q8H   pantoprazole   40 mg Oral BID   potassium chloride   40 mEq Oral Daily   QUEtiapine   25 mg Oral QHS   thiamine   100 mg Oral Daily   triamcinolone  0.1 % cream  : eucerin   Topical Daily    PRN meds: albuterol , alum & mag hydroxide-simeth, antiseptic oral rinse, artificial tears, benzonatate , HYDROmorphone  (DILAUDID ) injection, hydrOXYzine , melatonin, Muscle Rub, ondansetron , mouth rinse, phenol, prochlorperazine , simethicone , witch hazel-glycerin    Infusions:    Antimicrobials: Anti-infectives (From admission, onward)    Start     Dose/Rate Route Frequency Ordered Stop   12/06/23 1930  fluconazole  (DIFLUCAN ) tablet 150 mg        150 mg Oral  Once 12/06/23 1831 12/06/23 1905   11/21/23 1115  sulfamethoxazole -trimethoprim  (BACTRIM  DS) 800-160 MG per tablet 1 tablet        1 tablet Oral Every 12 hours 11/21/23 1024 11/23/23 2117   11/20/23 1400  cefTRIAXone  (ROCEPHIN ) 1 g in sodium chloride  0.9 % 100 mL IVPB  Status:  Discontinued        1 g 200 mL/hr over 30 Minutes Intravenous Every 24 hours 11/20/23 1245 11/21/23 1024   11/12/23 1145  rifaximin  (XIFAXAN ) tablet 550 mg  Status:  Discontinued        550 mg Oral 2 times daily 11/12/23 1058 11/22/23 1628   10/29/23 0000  rifaximin  (XIFAXAN ) 550 MG TABS tablet        550 mg Oral 2 times daily 10/29/23 1129     10/25/23 1415  rifaximin  (XIFAXAN ) tablet 550 mg  Status:  Discontinued        550 mg Oral 2 times daily 10/25/23 1315 11/10/23 1244  10/12/23 1000  metroNIDAZOLE  (FLAGYL ) tablet 500 mg        500 mg Oral Every 12 hours 10/12/23 0734 10/22/23 2125   10/12/23 0830  cefTRIAXone  (ROCEPHIN ) 2 g in sodium chloride  0.9 % 100 mL IVPB        2 g 200 mL/hr over 30 Minutes Intravenous Every 24 hours 10/12/23 0734 10/22/23 1636   10/07/23 1000  fluconazole  (DIFLUCAN ) tablet 100 mg  Status:  Discontinued        100 mg Oral Daily 10/07/23 0853 10/07/23 0855   10/07/23 1000  fluconazole  (DIFLUCAN ) tablet 150 mg        150 mg Oral Daily 10/07/23 0855 10/07/23 1014   10/04/23 1000  cefTRIAXone  (ROCEPHIN ) 2 g in sodium chloride  0.9 % 100 mL IVPB        2 g 200 mL/hr over 30 Minutes Intravenous Every  24 hours 10/03/23 2154 10/08/23 0854   10/03/23 2015  cefTRIAXone  (ROCEPHIN ) 1 g in sodium chloride  0.9 % 100 mL IVPB        1 g 200 mL/hr over 30 Minutes Intravenous  Once 10/03/23 2010 10/03/23 2212       Objective: Vitals:   12/28/23 0402 12/28/23 0720  BP: 106/61 120/72  Pulse: (!) 104 97  Resp:  18  Temp: (!) 97.3 F (36.3 C) 98.4 F (36.9 C)  SpO2: 100% 98%    Intake/Output Summary (Last 24 hours) at 12/28/2023 1131 Last data filed at 12/28/2023 0803 Gross per 24 hour  Intake 595 ml  Output 550 ml  Net 45 ml   Filed Weights   12/24/23 0625 12/25/23 0500 12/28/23 0500  Weight: 61.8 kg 61.9 kg 62.3 kg   Weight change:  Body mass index is 25.11 kg/m.   Physical Exam: General exam: Pleasant, middle-aged Caucasian female Skin: No rashes, lesions or ulcers. HEENT: Atraumatic, normocephalic, no obvious bleeding.  Pictures of stye and cold sore attached below Lungs: Clear to auscultation bilaterally,  CVS: S1, S2, no murmur,   GI/Abd: Soft, nontender, distention improved after paracentesis yesterday, bowel sound present,   CNS: Alert, awake, slow to respond oriented x 3 Psychiatry: Labile mood.  Tearful suddenly and suddenly back to normal mood Extremities: trace pedal edema, no calf tenderness,       Data Review: I have personally reviewed the laboratory data and studies available.  F/u labs ordered. Unresulted Labs (From admission, onward)    None       Signed, Chapman Rota, MD [facility] 12/28/2023  "

## 2023-12-28 NOTE — Progress Notes (Signed)
 Occupational Therapy Treatment Patient Details Name: NEAVEH BELANGER MRN: [id number] DOB: 01/01/1971 Today's Date: 12/28/2023   History of present illness 53 y.o. female admitted 10/03/2023 with nausea/vomiting and bloody diarrhea. Workup for decompensated alcoholic cirrhosis, ascites, hepatic encephalopathy. S/p upper EGD 9/25 concerning for possible acute esophageal necrosis, portal hypertension, non-bleeding gastric and duodenal ulcers. S/p recurrent paracentesis for recurrent ascites; IR deferred PleurX catheter placement due to social situation. PMH includes decompensated cirrhosis, PUD, GERD, pancreatitis, OSA, peripheral neuropathy, tobacco abuse, alcohol  abuse, severe protein malnutrition.   OT comments  Pt seen today for OT treatment, see updated goal end date expectation below (goals updated last visit 12/16). Pt appeared depressed today, she verbalized several stressors in her life and was emotionally labile. More difficult to redirect today. Would benefit from spiritual care and/or counseling services while in house given limited family support and medical situation. She was CGA at most for functional transfers and ambulation in room with RW, con't to demo poor safety judgement/awareness and is very tangential and inattentive. Poor STM and LTM recall. Currently recommending post-acute rehab < 3 hours/day, may consider long-term placement given pt has progressed from a functional standpoint. Will con't to follow acutely to address cognitive deficits.      If plan is discharge home, recommend the following:  A little help with bathing/dressing/bathroom;Direct supervision/assist for medications management;Direct supervision/assist for financial management;Assist for transportation;Supervision due to cognitive status   Equipment Recommendations  BSC/3in1    Recommendations for Other Services      Precautions / Restrictions Precautions Precautions: Fall Recall of Precautions/Restrictions:  Impaired Precaution/Restrictions Comments: hx of orthostasis Restrictions Weight Bearing Restrictions Per Provider Order: No       Mobility Bed Mobility               General bed mobility comments: received and left OOB    Transfers Overall transfer level: Needs assistance Equipment used: Rolling walker (2 wheels) Transfers: Sit to/from Stand Sit to Stand: Contact guard assist           General transfer comment: SUP for safety d/t poor safety awareness and judgement     Balance Overall balance assessment: Needs assistance Sitting-balance support: No upper extremity supported, Feet supported Sitting balance-Leahy Scale: Good Sitting balance - Comments: EOB   Standing balance support: During functional activity, No upper extremity supported, Bilateral upper extremity supported Standing balance-Leahy Scale: Fair Standing balance comment: reliant on RW                           ADL either performed or assessed with clinical judgement   ADL Overall ADL's : Needs assistance/impaired     Grooming: Wash/dry face;Standing;Contact guard assist Grooming Details (indicate cue type and reason): sinkside                 Toilet Transfer: Supervision/safety;Ambulation;Rolling walker (2 wheels) Toilet Transfer Details (indicate cue type and reason): utilized GBs to assist Toileting- Clothing Manipulation and Hygiene: Sit to/from stand;Contact guard assist Toileting - Clothing Manipulation Details (indicate cue type and reason): anterior peri hygiene by leaning on toilet seat     Functional mobility during ADLs: Contact guard assist;Rolling walker (2 wheels)      Extremity/Trunk Assessment              Vision       Perception     Praxis     Communication Communication Communication: No apparent difficulties   Cognition Arousal: Alert Behavior During  Therapy: WFL for tasks assessed/performed (irritable) Cognition: Cognition impaired    Orientation impairments: Time (repeated I thought it was 2 in the morning when realistically it was 2PM) Awareness: Intellectual awareness impaired Memory impairment (select all impairments): Short-term memory, Working memory Attention impairment (select first level of impairment): Sustained attention Executive functioning impairment (select all impairments): Organization, Reasoning, Problem solving, Initiation OT - Cognition Comments: irritable & tangential, appears depressed and verbalized several stressors in her life                 Following commands: Impaired Following commands impaired: Follows one step commands with increased time, Follows multi-step commands inconsistently      Cueing   Cueing Techniques: Verbal cues, Visual cues  Exercises      Shoulder Instructions       General Comments      Pertinent Vitals/ Pain       Pain Assessment Pain Assessment: Faces Faces Pain Scale: Hurts a little bit Pain Location: generalized Pain Descriptors / Indicators: Grimacing Pain Intervention(s): Limited activity within patient's tolerance  Home Living                                          Prior Functioning/Environment              Frequency  Min 1X/week        Progress Toward Goals  OT Goals(current goals can now be found in the care plan section)  Progress towards OT goals: Progressing toward goals  Acute Rehab OT Goals Time For Goal Achievement: 01/11/24  Plan      Co-evaluation                 AM-PAC OT 6 Clicks Daily Activity     Outcome Measure   Help from another person eating meals?: None Help from another person taking care of personal grooming?: A Little Help from another person toileting, which includes using toliet, bedpan, or urinal?: A Little Help from another person bathing (including washing, rinsing, drying)?: A Little Help from another person to put on and taking off regular upper body clothing?:  A Little Help from another person to put on and taking off regular lower body clothing?: A Little 6 Click Score: 19    End of Session Equipment Utilized During Treatment: Rolling walker (2 wheels)  OT Visit Diagnosis: Other abnormalities of gait and mobility (R26.89);Muscle weakness (generalized) (M62.81);Pain;Other symptoms and signs involving cognitive function   Activity Tolerance Patient tolerated treatment well   Patient Left in bed;with call bell/phone within reach;with bed alarm set   Nurse Communication Mobility status        Time: 8654-8575 OT Time Calculation (min): 39 min  Charges: OT General Charges $OT Visit: 1 Visit OT Treatments $Self Care/Home Management : 23-37 mins $Therapeutic Activity: 8-22 mins  Lacretia Tindall M. [place], OTR/L [facility] [phone] Secure Chat Preferred  Varnell Donate 12/28/2023, 4:33 PM

## 2023-12-28 NOTE — TOC Progression Note (Signed)
 Transition of Care [facility]) - Progression Note    Patient Details  Name: Joan Wood MRN: [id number] Date of Birth: November 02, 1970  Transition of Care [facility] CM/SW Contact  [name], [name] Phone Number: 12/28/2023, 10:57 AM  Clinical Narrative:   CSW continuing to follow for disposition. Patient continues with no bed offers at this time. CSW to follow.    Expected Discharge Plan: Skilled Nursing Facility Barriers to Discharge: Homeless with medical needs, Inadequate or no insurance, Continued Medical Work up, Unsafe home situation               Expected Discharge Plan and Services In-house Referral: Clinical Social Work Discharge Planning Services: CM Consult Post Acute Care Choice: Home Health Living arrangements for the past 2 months: Single Family Home                 DME Arranged: Walker rolling   Date DME Agency Contacted: 10/08/23   Representative spoke with at DME Agency: [name]             Social Drivers of Health (SDOH) Interventions SDOH Screenings   Food Insecurity: No Food Insecurity (10/03/2023)  Housing: Low Risk (10/03/2023)  Transportation Needs: No Transportation Needs (10/03/2023)  Recent Concern: Transportation Needs - Unmet Transportation Needs (08/20/2023)  Utilities: Not At Risk (10/03/2023)  Depression (PHQ2-9): High Risk (07/30/2023)  Social Connections: Socially Isolated (10/03/2023)  Tobacco Use: High Risk (10/04/2023)    Readmission Risk Interventions    10/04/2023    9:02 AM 09/03/2023   12:31 PM 09/02/2023    9:01 AM  Readmission Risk Prevention Plan  Transportation Screening Complete Complete Complete  PCP or Specialist Appt within 3-5 Days   Complete  HRI or Home Care Consult   Complete  Social Work Consult for Recovery Care Planning/Counseling   Complete  Palliative Care Screening   Not Applicable  Medication Review Oceanographer) Complete Complete Complete  HRI or Home Care Consult Complete Complete   SW Recovery  Care/Counseling Consult Complete Complete   Palliative Care Screening Not Applicable Not Applicable   Skilled Nursing Facility Not Applicable Patient Refused

## 2023-12-28 NOTE — Plan of Care (Signed)
  Problem: Clinical Measurements: Goal: Ability to maintain clinical measurements within normal limits will improve Outcome: Progressing Goal: Will remain free from infection Outcome: Progressing Goal: Diagnostic test results will improve Outcome: Progressing Goal: Respiratory complications will improve Outcome: Progressing Goal: Cardiovascular complication will be avoided Outcome: Progressing   Problem: Activity: Goal: Risk for activity intolerance will decrease Outcome: Progressing   Problem: Elimination: Goal: Will not experience complications related to bowel motility Outcome: Progressing Goal: Will not experience complications related to urinary retention Outcome: Progressing   Problem: Safety: Goal: Ability to remain free from injury will improve Outcome: Progressing   Problem: Skin Integrity: Goal: Risk for impaired skin integrity will decrease Outcome: Progressing   

## 2023-12-29 DIAGNOSIS — K7290 Hepatic failure, unspecified without coma: Secondary | ICD-10-CM | POA: Diagnosis not present

## 2023-12-29 DIAGNOSIS — K7460 Unspecified cirrhosis of liver: Secondary | ICD-10-CM | POA: Diagnosis not present

## 2023-12-29 NOTE — Progress Notes (Signed)
 "  PROGRESS NOTE    Joan Wood  FMW:[id number] DOB: 29-Jan-1970 DOA: 10/03/2023 PCP: Edman Meade PEDLAR, FNP   Brief Narrative: 53 y.o. female with a history of decompensated liver cirrhosis, PUD, gastric ulcer perforation status post ex lap, GERD, pancreatitis, obstructive sleep apnea. Patient presented secondary to nausea, coffee-ground emesis, bloody diarrhea with concern for upper GI bleed. 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 Hospitalization complicated by hepatic encephalopathy treated with lactulose  and rifaximin . Patient also requiring recurrent paracenteses for her recurrent ascites.    Assessment and Plan:  Decompensated alcoholic cirrhosis with ascites Patient treated empirically with Ceftriaxone  for possible SBP, although ascites fluid cell count not consistent with diagnosis. Palliative care consulted for ongoing goals of care discussions. Continued full code. -Continue to treat hepatic encephalopathy -Continue paracenteses as needed for symptoms  Hepatic encephalopathy Improved with lactulose  and Rifaximin . Patient's insurance will not cover Rifaximin  on discharge and it has been discontinued. -Continue lactulose   Transient alteration of awareness Patient developed significant change in mental status and was difficult to arose. PCCM was consulted for concern that patient would continue to decompensate. Patient spontaneously improved about 30 minutes later. Unclear etiology. She does not appear to have any post-ictal type presentation. She had an elevated temperature; no leukocytosis. Also complicated by patient's hyponatremia. Blood cultures with no growth to date.  Hypoalbuminemia Secondary to underlying liver disease.  Thrombocytopenia Secondary to underlying liver disease.  AKI Presumed secondary to ATN from  contrast. Nephrology was consulted for management. Renal function improved. Patient is not an HD candidate.  Appendicitis General surgery consulted. Patient high-risk for surgical management with recommendation for conservative management. Patient completed antibiotic regimen.  Chronic hypotension Patient managed on midodrine . Stable. -Continue midodrine  20 mg TID  Chronic hyponatremia Patient's baseline appears to be around 125 -128.  Sodium has significantly worsened. Patient has required nephrology consultation with Lasix /sodium tablets; sodium tablets discontinued. On Lasix  with stable sodium. -Continue 2 gram sodium diet fluid restriction (1,500 mL)  Acute upper GI bleed Acute esophageal necrosis Acute blood loss anemia Patient seen and evaluated by gastroenterology. Upper GI endoscopy performed on 9/25 with evidence of grade D esophagitis in addition to concern for acute esophageal necrosis. Non-bleeding gastric ulcer noted with sutures. For esophageal necrosis, GI recommended carafate  for two weeks in addition to Protonix  BID and outpatient GI follow-up. Patient has required a total of 4 units of PRBC via transfusion this admission with last transfusion on 11/1. Hemoglobin stable.  Abdominal pain Presumed secondary to abdominal distension from ascites. Recurrent. Last paracentesis on 10/27.  Lower extremity edema VTE ruled out. Likely related to hypoalbuminemia from liver disease.  Hypervolemic hyponatremia Sodium is stable.  Hypomagnesemia Improved with supplementation  Hypokalemia Resolved with potassium supplementation.  Tobacco abuse Patient managed with a nicotine  patch while inpatient which is now discontinued.  Alcohol  abuse Noted.  Severe malnutrition -Dietitian recommendations (11/11): Continue 2 g sodium diet with 1.2 L fluid restriction. Continue Magic Cup berry and choc BID. Each supplement provides 290 Kcals and 9 grams of protein. Continue Ensure+HP  TID. Each supplement provides 350 kcal and 20 grams of protein. Continue daily multivitamin PO, 100 mg thiamine  PO daily, and 1 mg folic acid  PO daily. Continue Juven BID for wound healing.  Goals of care Patient has been seen by palliative care and continues to desire full scope medical care.   Pressure injury Mid sacrum.  Unclear if present on admission.   DVT prophylaxis: SCDs Code Status:   Code Status: Full Code Family Communication: None at bedside. Disposition Plan: Discharge pending safe discharge plan and stable sodium   Consultants:  Palliative care medicine Gastroenterology  Procedures:    Antimicrobials:     Subjective: Patient reports bilateral leg pain. She thinks she will need a paracentesis soon.  Objective: BP 130/66 (BP Location: Right Arm)   Pulse 88   Temp 98.3 F (36.8 C) (Axillary)   Resp 19   Ht 5' 2 (1.575 m)   Wt 62.3 kg   SpO2 98%   BMI 25.11 kg/m   Examination:  General exam: Appears anxious. Respiratory system: Clear to auscultation. Respiratory effort normal. Cardiovascular system: S1 & S2 heard, RRR. No murmur. Gastrointestinal system: Abdomen is distended, soft and tender. Central nervous system: Alert and oriented. Musculoskeletal: 2+ bilateral LE pitting edema. No calf tenderness   Data Reviewed: I have personally reviewed following labs and imaging studies  CBC Lab Results  Component Value Date   WBC 5.1 12/27/2023   RBC 2.64 (L) 12/27/2023   HGB 8.2 (L) 12/27/2023   HCT 24.8 (L) 12/27/2023   MCV 93.9 12/27/2023   MCH 31.1 12/27/2023   PLT 131 (L) 12/27/2023   MCHC 33.1 12/27/2023   RDW 15.2 12/27/2023   LYMPHSABS 0.9 12/27/2023   MONOABS 1.1 (H) 12/27/2023   EOSABS 0.1 12/27/2023   BASOSABS 0.0 12/27/2023     Last metabolic panel Lab Results  Component Value Date   NA 127 (L) 12/27/2023   K 3.9 12/27/2023   CL 96 (L) 12/27/2023   CO2 25 12/27/2023   BUN 22 (H) 12/27/2023   CREATININE 0.73 12/27/2023    GLUCOSE 99 12/27/2023   GFRNONAA >60 12/27/2023   CALCIUM  8.2 (L) 12/27/2023   PHOS 4.2 12/20/2023   PROT 6.3 (L) 12/27/2023   ALBUMIN  2.3 (L) 12/27/2023   LABGLOB 4.4 07/30/2023   AGRATIO 1.2 03/28/2022   BILITOT 0.9 12/27/2023   ALKPHOS 106 12/27/2023   AST 57 (H) 12/27/2023   ALT 26 12/27/2023   ANIONGAP 5 12/27/2023    GFR: Estimated Creatinine Clearance: 69.8 mL/min (by C-G formula based on SCr of 0.73 mg/dL).  No results found for this or any previous visit (from the past 240 hours).      Radiology Studies: IR Paracentesis Result Date: 12/27/2023 INDICATION: 53 year old female with history of decompensated EtOH cirrhosis, with recurrent ascites. IR was requested for therapeutic paracentesis. EXAM: ULTRASOUND GUIDED THERAPEUTIC PARACENTESIS MEDICATIONS: 4 mL of 1% lidocaine . COMPLICATIONS: None immediate. PROCEDURE: Informed written consent was obtained from the patient after a discussion of the risks, benefits and alternatives to treatment. A timeout was performed prior to the initiation of the procedure. Initial ultrasound scanning demonstrates a large amount of ascites within the right lower abdominal quadrant. The right lower abdomen was prepped and draped in the usual sterile fashion. 1% lidocaine  was used for local anesthesia. Following this, a 19 gauge, 7-cm, Yueh catheter was introduced. An ultrasound image was saved for documentation purposes. The paracentesis was performed. The catheter was removed and a dressing was applied. The patient tolerated the procedure well without immediate post procedural complication. FINDINGS: A total of approximately 5.4 L of clear, straw-colored peritoneal fluid was removed. Samples were sent to the laboratory as requested by the clinical team. IMPRESSION: Successful ultrasound-guided paracentesis yielding 5.4 liters of peritoneal fluid. Procedure performed by: Carlin Griffon, PA-C Electronically Signed   By: Juliene Balder  M.D.   On: 12/27/2023  16:56       LOS: 87 days    Elgin Lam, MD [facility] 12/29/2023, 7:37 AM   If 7PM-7AM, please contact night-coverage [email or web address]  "

## 2023-12-29 NOTE — Plan of Care (Signed)

## 2023-12-30 DIAGNOSIS — K7460 Unspecified cirrhosis of liver: Secondary | ICD-10-CM | POA: Diagnosis not present

## 2023-12-30 DIAGNOSIS — K7290 Hepatic failure, unspecified without coma: Secondary | ICD-10-CM | POA: Diagnosis not present

## 2023-12-30 NOTE — Plan of Care (Signed)

## 2023-12-30 NOTE — Progress Notes (Signed)
 "  PROGRESS NOTE    Joan Wood  FMW:[id number] DOB: 1970-12-04 DOA: 10/03/2023 PCP: Edman Meade PEDLAR, FNP   Brief Narrative: 53 y.o. female with a history of decompensated liver cirrhosis, PUD, gastric ulcer perforation status post ex lap, GERD, pancreatitis, obstructive sleep apnea. Patient presented secondary to nausea, coffee-ground emesis, bloody diarrhea with concern for upper GI bleed. 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 Hospitalization complicated by hepatic encephalopathy treated with lactulose  and rifaximin . Patient also requiring recurrent paracenteses for her recurrent ascites.   Assessment and Plan:  Decompensated alcoholic cirrhosis with ascites Patient treated empirically with Ceftriaxone  for possible SBP, although ascites fluid cell count not consistent with diagnosis. Palliative care consulted for ongoing goals of care discussions. Continued full code. -Continue to treat hepatic encephalopathy -Continue paracenteses as needed for symptoms  Hepatic encephalopathy Improved with lactulose  and Rifaximin . Patient's insurance will not cover Rifaximin  on discharge and it has been discontinued. -Continue lactulose   Transient alteration of awareness Patient developed significant change in mental status and was difficult to arose. PCCM was consulted for concern that patient would continue to decompensate. Patient spontaneously improved about 30 minutes later. Unclear etiology. She does not appear to have any post-ictal type presentation. She had an elevated temperature; no leukocytosis. Also complicated by patient's hyponatremia. Blood cultures with no growth to date.  Hypoalbuminemia Secondary to underlying liver disease.  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 Stared on acyclovir  cream with  improvement of lesions. -Continue Acyclovir  cream  Left eye stye Not consistent with herpes infection. No associated erythema or eye pain. Patient started on erythromycin  ointment. -Continue erythromycin  ointment  Thrombocytopenia Secondary to underlying liver disease.  AKI Presumed secondary to ATN from contrast. Nephrology was consulted for management. Renal function improved. Patient is not an HD candidate.  Appendicitis General surgery consulted. Patient high-risk for surgical management with recommendation for conservative management. Patient completed antibiotic regimen.  Chronic hypotension Patient managed on midodrine . Stable. -Continue midodrine  20 mg TID  Chronic hyponatremia Patient's baseline appears to be around 125 -128.  Sodium has significantly worsened. Patient has required nephrology consultation with Lasix /sodium tablets; sodium tablets discontinued. On Lasix  with stable sodium. -Continue 2 gram sodium diet fluid restriction (1,500 mL)  Acute upper GI bleed Acute esophageal necrosis Acute blood loss anemia Patient seen and evaluated by gastroenterology. Upper GI endoscopy performed on 9/25 with evidence of grade D esophagitis in addition to concern for acute esophageal necrosis. Non-bleeding gastric ulcer noted with sutures. For esophageal necrosis, GI recommended carafate  for two weeks in addition to Protonix  BID and outpatient GI follow-up. Patient has required a total of 4 units of PRBC via transfusion this admission with last transfusion on 11/1. Hemoglobin stable.  Abdominal pain Presumed secondary to abdominal distension from ascites. Recurrent. Last paracentesis on 10/27.  Lower extremity edema VTE ruled out. Likely related to hypoalbuminemia from liver disease.  Hypervolemic hyponatremia Sodium is stable.  Hypomagnesemia Improved with supplementation  Hypokalemia Resolved with potassium supplementation.  Tobacco abuse Patient managed with a  nicotine  patch while inpatient which is now discontinued.  Alcohol  abuse Noted.  Severe malnutrition -Dietitian recommendations (11/11): Continue 2 g sodium diet with 1.2 L fluid restriction. Continue Magic Cup berry and choc BID. Each supplement provides 290 Kcals and 9 grams of protein. Continue Ensure+HP TID. Each supplement provides 350 kcal and 20 grams of protein. Continue daily multivitamin  PO, 100 mg thiamine  PO daily, and 1 mg folic acid  PO daily. Continue Juven BID for wound healing.  Goals of care Patient has been seen by palliative care and continues to desire full scope medical care.   Pressure injury Mid sacrum. Unclear if present on admission.   DVT prophylaxis: SCDs Code Status:   Code Status: Full Code Family Communication: None at bedside. Disposition Plan: Discharge pending safe discharge plan and stable sodium   Consultants:  Palliative care medicine Gastroenterology  Procedures:    Antimicrobials:     Subjective: Patient reports having a bad nightmare about someone chasing her. Still with pain. No other concerns.  Objective: BP 119/75 (BP Location: Right Arm)   Pulse (!) 103   Temp 98.6 F (37 C) (Oral)   Resp 17   Ht 5' 2 (1.575 m)   Wt 62.3 kg   SpO2 100%   BMI 25.11 kg/m   Examination:  General exam: Appears anxious. Respiratory system: Clear to auscultation. Respiratory effort normal. Cardiovascular system: S1 & S2 heard, RRR. No murmur. Gastrointestinal system: Abdomen is distended, soft and tender. Central nervous system: Alert and oriented. Musculoskeletal: 2+ bilateral LE pitting edema. General leg tenderness.   Data Reviewed: I have personally reviewed following labs and imaging studies  CBC Lab Results  Component Value Date   WBC 5.1 12/27/2023   RBC 2.64 (L) 12/27/2023   HGB 8.2 (L) 12/27/2023   HCT 24.8 (L) 12/27/2023   MCV 93.9 12/27/2023   MCH 31.1 12/27/2023   PLT 131 (L) 12/27/2023   MCHC 33.1 12/27/2023    RDW 15.2 12/27/2023   LYMPHSABS 0.9 12/27/2023   MONOABS 1.1 (H) 12/27/2023   EOSABS 0.1 12/27/2023   BASOSABS 0.0 12/27/2023     Last metabolic panel Lab Results  Component Value Date   NA 127 (L) 12/27/2023   K 3.9 12/27/2023   CL 96 (L) 12/27/2023   CO2 25 12/27/2023   BUN 22 (H) 12/27/2023   CREATININE 0.73 12/27/2023   GLUCOSE 99 12/27/2023   GFRNONAA >60 12/27/2023   CALCIUM  8.2 (L) 12/27/2023   PHOS 4.2 12/20/2023   PROT 6.3 (L) 12/27/2023   ALBUMIN  2.3 (L) 12/27/2023   LABGLOB 4.4 07/30/2023   AGRATIO 1.2 03/28/2022   BILITOT 0.9 12/27/2023   ALKPHOS 106 12/27/2023   AST 57 (H) 12/27/2023   ALT 26 12/27/2023   ANIONGAP 5 12/27/2023    GFR: Estimated Creatinine Clearance: 69.8 mL/min (by C-G formula based on SCr of 0.73 mg/dL).  No results found for this or any previous visit (from the past 240 hours).      Radiology Studies: No results found.      LOS: 88 days    Elgin Lam, MD [facility] 12/30/2023, 11:26 AM   If 7PM-7AM, please contact night-coverage [email or web address]  "

## 2023-12-31 DIAGNOSIS — K7460 Unspecified cirrhosis of liver: Secondary | ICD-10-CM | POA: Diagnosis not present

## 2023-12-31 DIAGNOSIS — K7290 Hepatic failure, unspecified without coma: Secondary | ICD-10-CM | POA: Diagnosis not present

## 2023-12-31 MED ORDER — OXYCODONE HCL 5 MG PO TABS
5.0000 mg | ORAL_TABLET | Freq: Four times a day (QID) | ORAL | Status: DC | PRN
  Administered 2023-12-31 – 2024-01-05 (×15): 5 mg via ORAL
  Filled 2023-12-31 (×17): qty 1

## 2023-12-31 MED ORDER — FUROSEMIDE 10 MG/ML IJ SOLN
20.0000 mg | Freq: Once | INTRAMUSCULAR | Status: AC
  Administered 2023-12-31: 20 mg via INTRAVENOUS
  Filled 2023-12-31: qty 4

## 2023-12-31 NOTE — Plan of Care (Signed)
  Problem: Activity: Goal: Risk for activity intolerance will decrease Outcome: Progressing   Problem: Nutrition: Goal: Adequate nutrition will be maintained Outcome: Progressing   Problem: Coping: Goal: Level of anxiety will decrease Outcome: Progressing   Problem: Elimination: Goal: Will not experience complications related to bowel motility Outcome: Progressing   Problem: Pain Managment: Goal: General experience of comfort will improve and/or be controlled Outcome: Progressing   Problem: Skin Integrity: Goal: Risk for impaired skin integrity will decrease Outcome: Progressing

## 2023-12-31 NOTE — Progress Notes (Signed)
 "  PROGRESS NOTE    Joan Wood  FMW:[id number] DOB: 04/16/1970 DOA: 10/03/2023 PCP: Edman Meade PEDLAR, FNP   Brief Narrative: 53 y.o. female with a history of decompensated liver cirrhosis, PUD, gastric ulcer perforation status post ex lap, GERD, pancreatitis, obstructive sleep apnea. Patient presented secondary to nausea, coffee-ground emesis, bloody diarrhea with concern for upper GI bleed. 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 Hospitalization complicated by hepatic encephalopathy treated with lactulose  and rifaximin . Patient also requiring recurrent paracenteses for her recurrent ascites.   Assessment and Plan:  Decompensated alcoholic cirrhosis with ascites Patient treated empirically with Ceftriaxone  for possible SBP, although ascites fluid cell count not consistent with diagnosis. Palliative care consulted for ongoing goals of care discussions. Continued full code. -Continue to treat hepatic encephalopathy -Continue paracenteses as needed for symptoms  Hepatic encephalopathy Improved with lactulose  and Rifaximin . Patient's insurance will not cover Rifaximin  on discharge and it has been discontinued. -Continue lactulose   Transient alteration of awareness Patient developed significant change in mental status and was difficult to arose. PCCM was consulted for concern that patient would continue to decompensate. Patient spontaneously improved about 30 minutes later. Unclear etiology. She does not appear to have any post-ictal type presentation. She had an elevated temperature; no leukocytosis. Also complicated by patient's hyponatremia. Blood cultures with no growth to date.  Hypoalbuminemia Secondary to underlying liver disease.  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 Stared on acyclovir  cream with  improvement of lesions. -Continue Acyclovir  cream  Left eye stye Not consistent with herpes infection. No associated erythema or eye pain. Patient started on erythromycin  ointment. Symptoms improving. -Continue erythromycin  ointment  Thrombocytopenia Secondary to underlying liver disease.  AKI Presumed secondary to ATN from contrast. Nephrology was consulted for management. Renal function improved. Patient is not an HD candidate.  Appendicitis General surgery consulted. Patient high-risk for surgical management with recommendation for conservative management. Patient completed antibiotic regimen.  Chronic hypotension Patient managed on midodrine . Stable. -Continue midodrine  20 mg TID  Chronic hyponatremia Patient's baseline appears to be around 125 -128.  Sodium has significantly worsened. Patient has required nephrology consultation with Lasix /sodium tablets; sodium tablets discontinued. On Lasix  with stable sodium. -Continue 2 gram sodium diet fluid restriction (1,500 mL)  Acute upper GI bleed Acute esophageal necrosis Acute blood loss anemia Patient seen and evaluated by gastroenterology. Upper GI endoscopy performed on 9/25 with evidence of grade D esophagitis in addition to concern for acute esophageal necrosis. Non-bleeding gastric ulcer noted with sutures. For esophageal necrosis, GI recommended carafate  for two weeks in addition to Protonix  BID and outpatient GI follow-up. Patient has required a total of 4 units of PRBC via transfusion this admission with last transfusion on 11/1. Hemoglobin stable.  Abdominal pain Presumed secondary to abdominal distension from ascites. Recurrent. Last paracentesis on 10/27.  Lower extremity edema VTE ruled out. Likely related to hypoalbuminemia from liver disease. -Continue Lasix  PO -Add Lasix  IV 20 mg x1  Hypervolemic hyponatremia Sodium is stable.  Hypomagnesemia Improved with supplementation  Hypokalemia Resolved with  potassium supplementation.  Tobacco abuse Patient managed with a nicotine  patch while inpatient which is now discontinued.  Alcohol  abuse Noted.  Severe malnutrition -Dietitian recommendations (11/11): Continue 2 g sodium diet with 1.2 L fluid restriction. Continue Magic Cup berry and choc BID. Each supplement provides 290 Kcals and 9 grams of protein. Continue Ensure+HP TID. Each supplement  provides 350 kcal and 20 grams of protein. Continue daily multivitamin PO, 100 mg thiamine  PO daily, and 1 mg folic acid  PO daily. Continue Juven BID for wound healing.  Goals of care Patient has been seen by palliative care and continues to desire full scope medical care.   Pressure injury Mid sacrum. Unclear if present on admission.   DVT prophylaxis: SCDs Code Status:   Code Status: Full Code Family Communication: None at bedside. Disposition Plan: Discharge pending safe discharge plan and stable sodium   Consultants:  Palliative care medicine Gastroenterology  Procedures:    Antimicrobials:     Subjective: Patient reports ongoing pain located from her lower abdomen down her legs bilaterally. No other issues.  Objective: BP 105/74 (BP Location: Right Arm)   Pulse (!) 114   Temp 98.7 F (37.1 C) (Oral)   Resp 16   Ht 5' 2 (1.575 m)   Wt 62.3 kg   SpO2 100%   BMI 25.11 kg/m   Examination:  General exam: Appears anxious. Respiratory system: Clear to auscultation. Respiratory effort normal. Cardiovascular system: S1 & S2 heard, RRR. No murmur. Gastrointestinal system: Abdomen is distended, soft and tender. Central nervous system: Alert and oriented. Musculoskeletal: 2+ bilateral LE pitting edema. General leg tenderness.   Data Reviewed: I have personally reviewed following labs and imaging studies  CBC Lab Results  Component Value Date   WBC 5.1 12/27/2023   RBC 2.64 (L) 12/27/2023   HGB 8.2 (L) 12/27/2023   HCT 24.8 (L) 12/27/2023   MCV 93.9 12/27/2023    MCH 31.1 12/27/2023   PLT 131 (L) 12/27/2023   MCHC 33.1 12/27/2023   RDW 15.2 12/27/2023   LYMPHSABS 0.9 12/27/2023   MONOABS 1.1 (H) 12/27/2023   EOSABS 0.1 12/27/2023   BASOSABS 0.0 12/27/2023     Last metabolic panel Lab Results  Component Value Date   NA 127 (L) 12/27/2023   K 3.9 12/27/2023   CL 96 (L) 12/27/2023   CO2 25 12/27/2023   BUN 22 (H) 12/27/2023   CREATININE 0.73 12/27/2023   GLUCOSE 99 12/27/2023   GFRNONAA >60 12/27/2023   CALCIUM  8.2 (L) 12/27/2023   PHOS 4.2 12/20/2023   PROT 6.3 (L) 12/27/2023   ALBUMIN  2.3 (L) 12/27/2023   LABGLOB 4.4 07/30/2023   AGRATIO 1.2 03/28/2022   BILITOT 0.9 12/27/2023   ALKPHOS 106 12/27/2023   AST 57 (H) 12/27/2023   ALT 26 12/27/2023   ANIONGAP 5 12/27/2023    GFR: Estimated Creatinine Clearance: 69.8 mL/min (by C-G formula based on SCr of 0.73 mg/dL).  No results found for this or any previous visit (from the past 240 hours).      Radiology Studies: No results found.      LOS: 89 days    Elgin Lam, MD [facility] 12/31/2023, 10:48 AM   If 7PM-7AM, please contact night-coverage [email or web address]  "

## 2024-01-01 DIAGNOSIS — K7460 Unspecified cirrhosis of liver: Secondary | ICD-10-CM | POA: Diagnosis not present

## 2024-01-01 DIAGNOSIS — K7290 Hepatic failure, unspecified without coma: Secondary | ICD-10-CM | POA: Diagnosis not present

## 2024-01-01 MED ORDER — FUROSEMIDE 10 MG/ML IJ SOLN
20.0000 mg | Freq: Once | INTRAMUSCULAR | Status: AC
  Administered 2024-01-01: 20 mg via INTRAVENOUS
  Filled 2024-01-01: qty 4

## 2024-01-01 NOTE — Progress Notes (Signed)
 Occupational Therapy Treatment Patient Details Name: Joan Wood MRN: [id number] DOB: 05/04/1970 Today's Date: 01/01/2024   History of present illness 53 y.o. female admitted 10/03/2023 with nausea/vomiting and bloody diarrhea. Workup for decompensated alcoholic cirrhosis, ascites, hepatic encephalopathy. S/p upper EGD 9/25 concerning for possible acute esophageal necrosis, portal hypertension, non-bleeding gastric and duodenal ulcers. S/p recurrent paracentesis for recurrent ascites; IR deferred PleurX catheter placement due to social situation. PMH includes decompensated cirrhosis, PUD, GERD, pancreatitis, OSA, peripheral neuropathy, tobacco abuse, alcohol  abuse, severe protein malnutrition.   OT comments  Patient received in supine and agreeable to OT treatment. Patient stating she was not feeling well and had back pain. Patient able to donn socks and paper scrub pants with CGA while standing. Patient able to perform toilet transfers and hygiene with CGA as well as stand at sink for hand hygiene. Patient limited with functional mobility due to pain.  Patient will benefit from continued inpatient follow up therapy, <3 hours/day.  Acute OT to continue to follow to address established goals to facilitate DC to next venue of care.        If plan is discharge home, recommend the following:  A little help with bathing/dressing/bathroom;Direct supervision/assist for medications management;Direct supervision/assist for financial management;Assist for transportation;Supervision due to cognitive status   Equipment Recommendations  BSC/3in1    Recommendations for Other Services      Precautions / Restrictions Precautions Precautions: Fall Recall of Precautions/Restrictions: Impaired Precaution/Restrictions Comments: hx of orthostasis Restrictions Weight Bearing Restrictions Per Provider Order: No       Mobility Bed Mobility Overal bed mobility: Needs Assistance Bed Mobility: Supine to Sit      Supine to sit: Supervision     General bed mobility comments: increased time due to patient stating back pain    Transfers Overall transfer level: Needs assistance Equipment used: Rolling walker (2 wheels) Transfers: Sit to/from Stand Sit to Stand: Contact guard assist           General transfer comment: CGA due to patient stating felt weak and back pain     Balance Overall balance assessment: Needs assistance Sitting-balance support: No upper extremity supported, Feet supported Sitting balance-Leahy Scale: Good Sitting balance - Comments: EOB   Standing balance support: During functional activity, No upper extremity supported, Bilateral upper extremity supported Standing balance-Leahy Scale: Fair Standing balance comment: reliant on RW                           ADL either performed or assessed with clinical judgement   ADL Overall ADL's : Needs assistance/impaired     Grooming: Wash/dry hands;Contact guard assist;Standing               Lower Body Dressing: Contact guard assist;Sit to/from stand Lower Body Dressing Details (indicate cue type and reason): donned paper scrub pants and socsk Toilet Transfer: Supervision/safety;Ambulation;Rolling walker (2 wheels)   Toileting- Clothing Manipulation and Hygiene: Sit to/from stand;Contact guard assist       Functional mobility during ADLs: Contact guard assist;Rolling walker (2 wheels)      Extremity/Trunk Assessment              Vision       Perception     Praxis     Communication Communication Communication: No apparent difficulties   Cognition Arousal: Alert Behavior During Therapy: WFL for tasks assessed/performed Cognition: Cognition impaired   Orientation impairments: Time Awareness: Intellectual awareness impaired Memory impairment (select all impairments):  Short-term memory, Working memory Attention impairment (select first level of impairment): Sustained  attention Executive functioning impairment (select all impairments): Organization, Reasoning, Problem solving, Initiation OT - Cognition Comments: patient appears depressed, states not feeling well                 Following commands: Impaired Following commands impaired: Follows one step commands with increased time, Follows multi-step commands inconsistently      Cueing   Cueing Techniques: Verbal cues, Visual cues  Exercises      Shoulder Instructions       General Comments patient stating not feeling well with complaints of back pain    Pertinent Vitals/ Pain       Pain Assessment Pain Assessment: Faces Faces Pain Scale: Hurts little more Pain Location: back Pain Descriptors / Indicators: Aching, Grimacing Pain Intervention(s): Limited activity within patient's tolerance, Monitored during session, Premedicated before session, Repositioned  Home Living                                          Prior Functioning/Environment              Frequency  Min 1X/week        Progress Toward Goals  OT Goals(current goals can now be found in the care plan section)  Progress towards OT goals: Progressing toward goals     Plan      Co-evaluation                 AM-PAC OT 6 Clicks Daily Activity     Outcome Measure   Help from another person eating meals?: None Help from another person taking care of personal grooming?: A Little Help from another person toileting, which includes using toliet, bedpan, or urinal?: A Little Help from another person bathing (including washing, rinsing, drying)?: A Little Help from another person to put on and taking off regular upper body clothing?: A Little Help from another person to put on and taking off regular lower body clothing?: A Little 6 Click Score: 19    End of Session Equipment Utilized During Treatment: Gait belt;Rolling walker (2 wheels)  OT Visit Diagnosis: Other abnormalities of gait  and mobility (R26.89);Muscle weakness (generalized) (M62.81);Pain;Other symptoms and signs involving cognitive function Pain - part of body:  (back)   Activity Tolerance Patient tolerated treatment well   Patient Left in bed;with call bell/phone within reach;with bed alarm set   Nurse Communication Mobility status        Time: 9281-9257 OT Time Calculation (min): 24 min  Charges: OT General Charges $OT Visit: 1 Visit OT Treatments $Self Care/Home Management : 23-37 mins  Dick Laine, OTA Acute Rehabilitation Services  Office [phone]   Jeb LITTIE Laine 01/01/2024, 12:28 PM

## 2024-01-01 NOTE — Progress Notes (Signed)
 "  PROGRESS NOTE    Joan Wood  FMW:[id number] DOB: 10/31/1970 DOA: 10/03/2023 PCP: Edman Meade PEDLAR, FNP   Brief Narrative: 53 y.o. female with a history of decompensated liver cirrhosis, PUD, gastric ulcer perforation status post ex lap, GERD, pancreatitis, obstructive sleep apnea. Patient presented secondary to nausea, coffee-ground emesis, bloody diarrhea with concern for upper GI bleed. 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 Hospitalization complicated by hepatic encephalopathy treated with lactulose  and rifaximin . Patient also requiring recurrent paracenteses for her recurrent ascites.   Assessment and Plan:  Decompensated alcoholic cirrhosis with ascites Patient treated empirically with Ceftriaxone  for possible SBP, although ascites fluid cell count not consistent with diagnosis. Palliative care consulted for ongoing goals of care discussions. Continued full code. -Continue to treat hepatic encephalopathy -Continue paracenteses as needed for symptoms  Hepatic encephalopathy Improved with lactulose  and Rifaximin . Patient's insurance will not cover Rifaximin  on discharge and it has been discontinued. -Continue lactulose   Transient alteration of awareness Patient developed significant change in mental status and was difficult to arose. PCCM was consulted for concern that patient would continue to decompensate. Patient spontaneously improved about 30 minutes later. Unclear etiology. She does not appear to have any post-ictal type presentation. She had an elevated temperature; no leukocytosis. Also complicated by patient's hyponatremia. Blood cultures with no growth to date.  Hypoalbuminemia Secondary to underlying liver disease.  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 Stared on acyclovir  cream with  improvement of lesions. -Continue Acyclovir  cream  Left eye stye Not consistent with herpes infection. No associated erythema or eye pain. Patient started on erythromycin  ointment. Symptoms improving. -Continue erythromycin  ointment  Thrombocytopenia Secondary to underlying liver disease.  AKI Presumed secondary to ATN from contrast. Nephrology was consulted for management. Renal function improved. Patient is not an HD candidate.  Appendicitis General surgery consulted. Patient high-risk for surgical management with recommendation for conservative management. Patient completed antibiotic regimen.  Chronic hypotension Patient managed on midodrine . Stable. -Continue midodrine  20 mg TID  Chronic hyponatremia Patient's baseline appears to be around 125 -128.  Sodium has significantly worsened. Patient has required nephrology consultation with Lasix /sodium tablets; sodium tablets discontinued. On Lasix  with stable sodium. -Continue 2 gram sodium diet fluid restriction (1,500 mL)  Acute upper GI bleed Acute esophageal necrosis Acute blood loss anemia Patient seen and evaluated by gastroenterology. Upper GI endoscopy performed on 9/25 with evidence of grade D esophagitis in addition to concern for acute esophageal necrosis. Non-bleeding gastric ulcer noted with sutures. For esophageal necrosis, GI recommended carafate  for two weeks in addition to Protonix  BID and outpatient GI follow-up. Patient has required a total of 4 units of PRBC via transfusion this admission with last transfusion on 11/1. Hemoglobin stable.  Abdominal pain Presumed secondary to abdominal distension from ascites. Recurrent. Last paracentesis on 10/27.  Lower extremity edema VTE ruled out. Likely related to hypoalbuminemia from liver disease. -Continue Lasix  PO -Add another dose of Lasix  IV 20 mg x1  Hypervolemic hyponatremia Sodium is stable.  Hypomagnesemia Improved with  supplementation  Hypokalemia Resolved with potassium supplementation.  Tobacco abuse Patient managed with a nicotine  patch while inpatient which is now discontinued.  Alcohol  abuse Noted.  Severe malnutrition -Dietitian recommendations (11/11): Continue 2 g sodium diet with 1.2 L fluid restriction. Continue Magic Cup berry and choc BID. Each supplement provides 290 Kcals and 9 grams of protein. Continue Ensure+HP  TID. Each supplement provides 350 kcal and 20 grams of protein. Continue daily multivitamin PO, 100 mg thiamine  PO daily, and 1 mg folic acid  PO daily. Continue Juven BID for wound healing.  Goals of care Patient has been seen by palliative care and continues to desire full scope medical care.   Pressure injury Mid sacrum. Unclear if present on admission.   DVT prophylaxis: SCDs Code Status:   Code Status: Full Code Family Communication: None at bedside. Disposition Plan: Discharge pending safe discharge plan and stable sodium   Consultants:  Palliative care medicine Gastroenterology  Procedures:    Antimicrobials:     Subjective: Patient reports no specific concerns today.  Objective: BP 128/67 (BP Location: Right Arm)   Pulse (!) 106   Temp 99 F (37.2 C) (Oral)   Resp 16   Ht 5' 2 (1.575 m)   Wt 62.3 kg   SpO2 100%   BMI 25.11 kg/m   Examination:  General exam: Calm and comfortable. Respiratory system: Respiratory effort normal. Musculoskeletal: 2+ bilateral LE pitting edema.   Data Reviewed: I have personally reviewed following labs and imaging studies  CBC Lab Results  Component Value Date   WBC 5.1 12/27/2023   RBC 2.64 (L) 12/27/2023   HGB 8.2 (L) 12/27/2023   HCT 24.8 (L) 12/27/2023   MCV 93.9 12/27/2023   MCH 31.1 12/27/2023   PLT 131 (L) 12/27/2023   MCHC 33.1 12/27/2023   RDW 15.2 12/27/2023   LYMPHSABS 0.9 12/27/2023   MONOABS 1.1 (H) 12/27/2023   EOSABS 0.1 12/27/2023   BASOSABS 0.0 12/27/2023     Last  metabolic panel Lab Results  Component Value Date   NA 127 (L) 12/27/2023   K 3.9 12/27/2023   CL 96 (L) 12/27/2023   CO2 25 12/27/2023   BUN 22 (H) 12/27/2023   CREATININE 0.73 12/27/2023   GLUCOSE 99 12/27/2023   GFRNONAA >60 12/27/2023   CALCIUM  8.2 (L) 12/27/2023   PHOS 4.2 12/20/2023   PROT 6.3 (L) 12/27/2023   ALBUMIN  2.3 (L) 12/27/2023   LABGLOB 4.4 07/30/2023   AGRATIO 1.2 03/28/2022   BILITOT 0.9 12/27/2023   ALKPHOS 106 12/27/2023   AST 57 (H) 12/27/2023   ALT 26 12/27/2023   ANIONGAP 5 12/27/2023    GFR: Estimated Creatinine Clearance: 69.8 mL/min (by C-G formula based on SCr of 0.73 mg/dL).  No results found for this or any previous visit (from the past 240 hours).      Radiology Studies: No results found.      LOS: 90 days    Elgin Lam, MD [facility] 01/01/2024, 2:17 PM   If 7PM-7AM, please contact night-coverage [email or web address]  "

## 2024-01-01 NOTE — Plan of Care (Signed)

## 2024-01-02 DIAGNOSIS — F1090 Alcohol use, unspecified, uncomplicated: Secondary | ICD-10-CM | POA: Diagnosis not present

## 2024-01-02 DIAGNOSIS — K7290 Hepatic failure, unspecified without coma: Secondary | ICD-10-CM | POA: Diagnosis not present

## 2024-01-02 DIAGNOSIS — E871 Hypo-osmolality and hyponatremia: Secondary | ICD-10-CM | POA: Diagnosis not present

## 2024-01-02 DIAGNOSIS — K7460 Unspecified cirrhosis of liver: Secondary | ICD-10-CM | POA: Diagnosis not present

## 2024-01-02 DIAGNOSIS — E43 Unspecified severe protein-calorie malnutrition: Secondary | ICD-10-CM | POA: Diagnosis not present

## 2024-01-02 LAB — CBC
HCT: 25.7 % — ABNORMAL LOW (ref 36.0–46.0)
Hemoglobin: 8.9 g/dL — ABNORMAL LOW (ref 12.0–15.0)
MCH: 31.1 pg (ref 26.0–34.0)
MCHC: 34.6 g/dL (ref 30.0–36.0)
MCV: 89.9 fL (ref 80.0–100.0)
Platelets: 149 K/uL — ABNORMAL LOW (ref 150–400)
RBC: 2.86 MIL/uL — ABNORMAL LOW (ref 3.87–5.11)
RDW: 14.5 % (ref 11.5–15.5)
WBC: 4.9 K/uL (ref 4.0–10.5)
nRBC: 0 % (ref 0.0–0.2)

## 2024-01-02 LAB — COMPREHENSIVE METABOLIC PANEL WITH GFR
ALT: 25 U/L (ref 0–44)
AST: 54 U/L — ABNORMAL HIGH (ref 15–41)
Albumin: 2.2 g/dL — ABNORMAL LOW (ref 3.5–5.0)
Alkaline Phosphatase: 106 U/L (ref 38–126)
Anion gap: 9 (ref 5–15)
BUN: 10 mg/dL (ref 6–20)
CO2: 25 mmol/L (ref 22–32)
Calcium: 7.6 mg/dL — ABNORMAL LOW (ref 8.9–10.3)
Chloride: 91 mmol/L — ABNORMAL LOW (ref 98–111)
Creatinine, Ser: 0.51 mg/dL (ref 0.44–1.00)
GFR, Estimated: >60 mL/min
Glucose, Bld: 114 mg/dL — ABNORMAL HIGH (ref 70–99)
Potassium: 2.5 mmol/L — CL (ref 3.5–5.1)
Sodium: 126 mmol/L — ABNORMAL LOW (ref 135–145)
Total Bilirubin: 0.9 mg/dL (ref 0.0–1.2)
Total Protein: 5.9 g/dL — ABNORMAL LOW (ref 6.5–8.1)

## 2024-01-02 LAB — POTASSIUM: Potassium: 3.5 mmol/L (ref 3.5–5.1)

## 2024-01-02 MED ORDER — POTASSIUM CHLORIDE 20 MEQ PO PACK
60.0000 meq | PACK | Freq: Once | ORAL | Status: AC
  Administered 2024-01-02: 60 meq via ORAL
  Filled 2024-01-02: qty 3

## 2024-01-02 MED ORDER — POTASSIUM CHLORIDE 10 MEQ/100ML IV SOLN
10.0000 meq | INTRAVENOUS | Status: AC
  Administered 2024-01-02 (×2): 10 meq via INTRAVENOUS
  Filled 2024-01-02 (×2): qty 100

## 2024-01-02 MED ORDER — POTASSIUM CHLORIDE 10 MEQ/100ML IV SOLN: 10.0000 meq | INTRAVENOUS | Status: DC

## 2024-01-02 MED ORDER — POTASSIUM CHLORIDE 20 MEQ PO PACK
40.0000 meq | PACK | Freq: Once | ORAL | Status: AC
  Administered 2024-01-02: 40 meq via ORAL
  Filled 2024-01-02: qty 2

## 2024-01-02 NOTE — Plan of Care (Signed)
  Problem: Coping: Goal: Level of anxiety will decrease Outcome: Progressing   Problem: Elimination: Goal: Will not experience complications related to bowel motility Outcome: Progressing   Problem: Pain Managment: Goal: General experience of comfort will improve and/or be controlled Outcome: Progressing   Problem: Safety: Goal: Ability to remain free from injury will improve Outcome: Progressing   Problem: Skin Integrity: Goal: Risk for impaired skin integrity will decrease Outcome: Progressing

## 2024-01-02 NOTE — Plan of Care (Signed)

## 2024-01-02 NOTE — Progress Notes (Signed)
 "        [facility]                                                                               Joan Wood, is a 53 y.o. female, DOB - 1970-12-28, FMW:[id number] Admit date - 10/03/2023    Outpatient Primary MD for the patient is Bacchus, Meade PEDLAR, FNP  LOS - 91  days    Brief summary   53 y.o. female with a history of decompensated liver cirrhosis, PUD, gastric ulcer perforation status post ex lap, GERD, pancreatitis, obstructive sleep apnea. Patient presented secondary to nausea, coffee-ground emesis, bloody diarrhea with concern for upper GI bleed. 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 Hospitalization complicated by hepatic encephalopathy treated with lactulose  and rifaximin . Patient also requiring recurrent paracenteses for her recurrent ascites.   Assessment & Plan       Decompensated alcoholic cirrhosis with  ascites Paracentesis as needed for symptoms Patient wants to know if she can get peritoneal catheter for intermittent paracentesis   History of hepatic encephalopathy Continue with lactulose .  Patient reports having loose bowel movements.  She is alert and oriented at this time   Acute metabolic encephalopathy probably secondary to hepatic encephalopathy Transient alteration of awareness No signs of infection-, it appears to have resolved and she is back to her baseline.   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   Left eye stye No associated erythema or eye pain Continue with erythromycin .    AKI Secondary to ATN from contrast Nephrology consulted and signed off    Appendicitis earlier during the admission General surgery consulted unfortunately not a candidate for  surgical management due to high risk.  Patient was recommended conservative management with antibiotics, patient just completed the antibiotic regimen.    Hypotension Stable patient is on midodrine  continue the same     Chronic hyponatremia Probably secondary to bursitis Patient's baseline sodium appears to be between 125-128. Continue with weight restriction.  Patient on Lasix , continue the same.     Acute upper GI bleed Acute esophageal necrosis Acute blood loss anemia S/p a total of 4 units of PRBC transfusion. GI consulted patient underwent endoscopy in September showing evidence of grade D esophagitis in addition for acute esophageal necrosis. Continue with Protonix  twice daily and outpatient GI follow-up.   Lower extremity edema Secondary to hypoalbuminemia from liver disease   Hypomagnesemia and hypokalemia Replacements ordered repeat levels later today    History of tobacco abuse   Severe malnutrition -Dietitian recommendations (11/11): Continue 2 g sodium diet with 1.2 L fluid restriction. Continue Magic Cup berry and choc BID. Each supplement provides 290 Kcals and 9 grams of protein. Continue Ensure+HP TID. Each supplement provides 350 kcal and 20 grams of protein. Continue daily multivitamin PO, 100 mg thiamine  PO daily, and 1 mg folic acid  PO daily. Continue Juven BID for wound healing.   Goals of care Patient has been seen by palliative care and continues to  desire full scope medical care.    Pressure injury Mid sacrum. Unclear if present on admission.     Malnutrition Type:  Nutrition Problem: Severe Malnutrition Etiology: chronic illness (cirrhosis)   Malnutrition Characteristics:  Signs/Symptoms: severe fat depletion, severe muscle depletion   Nutrition Interventions:  Interventions: Ensure Enlive (each supplement provides 350kcal and 20 grams of protein), MVI, Liberalize Diet, Magic cup  Estimated body mass index is 25.11 kg/m as  calculated from the following:   Height as of this encounter: 5' 2 (1.575 m).   Weight as of this encounter: 62.3 kg.  Code Status: Full code DVT Prophylaxis:  SCDs Start: 10/03/23 2153   Level of Care: Level of care: Med-Surg Family Communication: None at bedside  Disposition Plan:     Remains inpatient appropriate: Pending  Procedures:  EGD Paracentesis  Consultants:   Palliative care medicine Gastroenterology Nephrology General surgery  Antimicrobials:   Anti-infectives (From admission, onward)    Start     Dose/Rate Route Frequency Ordered Stop   12/06/23 1930  fluconazole  (DIFLUCAN ) tablet 150 mg        150 mg Oral  Once 12/06/23 1831 12/06/23 1905   11/21/23 1115  sulfamethoxazole -trimethoprim  (BACTRIM  DS) 800-160 MG per tablet 1 tablet        1 tablet Oral Every 12 hours 11/21/23 1024 11/23/23 2117   11/20/23 1400  cefTRIAXone  (ROCEPHIN ) 1 g in sodium chloride  0.9 % 100 mL IVPB  Status:  Discontinued        1 g 200 mL/hr over 30 Minutes Intravenous Every 24 hours 11/20/23 1245 11/21/23 1024   11/12/23 1145  rifaximin  (XIFAXAN ) tablet 550 mg  Status:  Discontinued        550 mg Oral 2 times daily 11/12/23 1058 11/22/23 1628   10/29/23 0000  rifaximin  (XIFAXAN ) 550 MG TABS tablet        550 mg Oral 2 times daily 10/29/23 1129     10/25/23 1415  rifaximin  (XIFAXAN ) tablet 550 mg  Status:  Discontinued        550 mg Oral 2 times daily 10/25/23 1315 11/10/23 1244   10/12/23 1000  metroNIDAZOLE  (FLAGYL ) tablet 500 mg        500 mg Oral Every 12 hours 10/12/23 0734 10/22/23 2125   10/12/23 0830  cefTRIAXone  (ROCEPHIN ) 2 g in sodium chloride  0.9 % 100 mL IVPB        2 g 200 mL/hr over 30 Minutes Intravenous Every 24 hours 10/12/23 0734 10/22/23 1636   10/07/23 1000  fluconazole  (DIFLUCAN ) tablet 100 mg  Status:  Discontinued        100 mg Oral Daily 10/07/23 0853 10/07/23 0855   10/07/23 1000  fluconazole  (DIFLUCAN ) tablet 150 mg        150 mg Oral Daily 10/07/23 0855  10/07/23 1014   10/04/23 1000  cefTRIAXone  (ROCEPHIN ) 2 g in sodium chloride  0.9 % 100 mL IVPB        2 g 200 mL/hr over 30 Minutes Intravenous Every 24 hours 10/03/23 2154 10/08/23 0854   10/03/23 2015  cefTRIAXone  (ROCEPHIN ) 1 g in sodium chloride  0.9 % 100 mL IVPB        1 g 200 mL/hr over 30 Minutes Intravenous  Once 10/03/23 2010 10/03/23 2212        Medications  Scheduled Meds:  sodium chloride    Intravenous Once   acyclovir  cream   Topical Q3H while awake   calcium  carbonate  1 tablet Oral TID WC  feeding supplement  237 mL Oral TID BM   folic acid   1 mg Oral Daily   furosemide   40 mg Oral BID   gabapentin   600 mg Oral TID   lactulose   30 g Oral TID   lidocaine   2 patch Transdermal Q24H   lidocaine  (PF)  10 mL Other Once   midodrine   20 mg Oral TID WC   multivitamin with minerals  1 tablet Oral Daily   nutrition supplement (JUVEN)  1 packet Oral BID BM   pantoprazole   40 mg Oral BID   potassium chloride   40 mEq Oral Daily   QUEtiapine   25 mg Oral QHS   thiamine   100 mg Oral Daily   triamcinolone  0.1 % cream : eucerin   Topical Daily   Continuous Infusions: PRN Meds:.albuterol , alum & mag hydroxide-simeth, antiseptic oral rinse, artificial tears, benzonatate , HYDROmorphone  (DILAUDID ) injection, hydrOXYzine , melatonin, Muscle Rub, ondansetron , mouth rinse, oxyCODONE , phenol, prochlorperazine , simethicone , witch hazel-glycerin     Subjective:   Joan Wood was seen and examined today.   Wants to know if she can have an abdominal catheter for paracentesis.   Objective:   Vitals:   01/01/24 2006 01/02/24 0846 01/02/24 1106 01/02/24 1505  BP: 102/60 111/65 114/70 114/61  Pulse: 89 99 92 89  Resp: 17 18 18 16   Temp: 97.8 F (36.6 C) 99.5 F (37.5 C) 98.8 F (37.1 C) 99.8 F (37.7 C)  TempSrc:  Oral Oral Oral  SpO2: 99% 100% 100% 100%  Weight:      Height:        Intake/Output Summary (Last 24 hours) at 01/02/2024 1527 Last data filed at 01/02/2024  0846 Gross per 24 hour  Intake 155.37 ml  Output --  Net 155.37 ml   Filed Weights   12/24/23 0625 12/25/23 0500 12/28/23 0500  Weight: 61.8 kg 61.9 kg 62.3 kg     Exam General: Alert and oriented x 3, NAD Cardiovascular: S1 S2 auscultated, no murmurs, RRR Respiratory: Clear to auscultation bilaterally, no wheezing, rales or rhonchi Gastrointestinal: Soft, nontender mildly distended, bowel sounds normal Ext: No cyanosis Neuro: AAOx3,  Skin: No rashes Psych: Nalert and oriented x3    Data Reviewed:  I have personally reviewed following labs and imaging studies   CBC Lab Results  Component Value Date   WBC 4.9 01/02/2024   RBC 2.86 (L) 01/02/2024   HGB 8.9 (L) 01/02/2024   HCT 25.7 (L) 01/02/2024   MCV 89.9 01/02/2024   MCH 31.1 01/02/2024   PLT 149 (L) 01/02/2024   MCHC 34.6 01/02/2024   RDW 14.5 01/02/2024   LYMPHSABS 0.9 12/27/2023   MONOABS 1.1 (H) 12/27/2023   EOSABS 0.1 12/27/2023   BASOSABS 0.0 12/27/2023     Last metabolic panel Lab Results  Component Value Date   NA 126 (L) 01/02/2024   K 3.5 01/02/2024   CL 91 (L) 01/02/2024   CO2 25 01/02/2024   BUN 10 01/02/2024   CREATININE 0.51 01/02/2024   GLUCOSE 114 (H) 01/02/2024   GFRNONAA >60 01/02/2024   CALCIUM  7.6 (L) 01/02/2024   PHOS 4.2 12/20/2023   PROT 5.9 (L) 01/02/2024   ALBUMIN  2.2 (L) 01/02/2024   LABGLOB 4.4 07/30/2023   AGRATIO 1.2 03/28/2022   BILITOT 0.9 01/02/2024   ALKPHOS 106 01/02/2024   AST 54 (H) 01/02/2024   ALT 25 01/02/2024   ANIONGAP 9 01/02/2024    CBG (last 3)  No results for input(s): GLUCAP in the last 72 hours.  Coagulation Profile: No results for input(s): INR, PROTIME in the last 168 hours.   Radiology Studies: No results found.     Elgie Butter M.D. [facility] 01/02/2024, 3:27 PM  Available via Epic secure chat 7am-7pm After 7 pm, please refer to night coverage provider listed on amion.    "

## 2024-01-03 DIAGNOSIS — K7290 Hepatic failure, unspecified without coma: Secondary | ICD-10-CM | POA: Diagnosis not present

## 2024-01-03 DIAGNOSIS — E43 Unspecified severe protein-calorie malnutrition: Secondary | ICD-10-CM | POA: Diagnosis not present

## 2024-01-03 DIAGNOSIS — E871 Hypo-osmolality and hyponatremia: Secondary | ICD-10-CM | POA: Diagnosis not present

## 2024-01-03 DIAGNOSIS — K7460 Unspecified cirrhosis of liver: Secondary | ICD-10-CM | POA: Diagnosis not present

## 2024-01-03 DIAGNOSIS — F1090 Alcohol use, unspecified, uncomplicated: Secondary | ICD-10-CM | POA: Diagnosis not present

## 2024-01-03 LAB — BASIC METABOLIC PANEL WITH GFR
Anion gap: 9 (ref 5–15)
BUN: 19 mg/dL (ref 6–20)
CO2: 21 mmol/L — ABNORMAL LOW (ref 22–32)
Calcium: 8 mg/dL — ABNORMAL LOW (ref 8.9–10.3)
Chloride: 97 mmol/L — ABNORMAL LOW (ref 98–111)
Creatinine, Ser: 0.63 mg/dL (ref 0.44–1.00)
GFR, Estimated: >60 mL/min
Glucose, Bld: 98 mg/dL (ref 70–99)
Potassium: 4.4 mmol/L (ref 3.5–5.1)
Sodium: 127 mmol/L — ABNORMAL LOW (ref 135–145)

## 2024-01-03 NOTE — Progress Notes (Signed)
 "        [facility]                                                                               Joan Wood, is a 53 y.o. female, DOB - 1970-12-18, FMW:[id number] Admit date - 10/03/2023    Outpatient Primary MD for the patient is Bacchus, Meade PEDLAR, FNP  LOS - 92  days    Brief summary   53 y.o. female with a history of decompensated liver cirrhosis, PUD, gastric ulcer perforation status post ex lap, GERD, pancreatitis, obstructive sleep apnea. Patient presented secondary to nausea, coffee-ground emesis, bloody diarrhea with concern for upper GI bleed. 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 Hospitalization complicated by hepatic encephalopathy treated with lactulose  and rifaximin . Patient also requiring recurrent paracenteses for her recurrent ascites.   Assessment & Plan       Decompensated alcoholic cirrhosis with  ascites Paracentesis as needed for symptoms Patient wants to know if she can get peritoneal catheter for intermittent paracentesis. US  paracentesis ordered as her abdomen is more distended today.    History of hepatic encephalopathy Continue with lactulose .  Patient reports having loose bowel movements.  She is alert and oriented at this time   Acute metabolic encephalopathy probably secondary to hepatic encephalopathy Transient alteration of awareness No signs of infection-, it appears to have resolved and she is back to her baseline.   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   Left eye stye No associated erythema or eye pain Continue with erythromycin .    AKI Secondary to ATN from contrast Nephrology consulted and signed off    Appendicitis earlier during the  admission General surgery consulted unfortunately not a candidate for surgical management due to high risk.  Patient was recommended conservative management with antibiotics, patient just completed the antibiotic regimen.    Hypotension Stable patient is on midodrine  continue the same     Chronic hyponatremia Probably secondary to bursitis Patient's baseline sodium appears to be between 125-128. Continue with weight restriction.  Patient on Lasix , continue the same.     Acute upper GI bleed Acute esophageal necrosis Acute blood loss anemia S/p a total of 4 units of PRBC transfusion. GI consulted patient underwent endoscopy in September showing evidence of grade D esophagitis in addition for acute esophageal necrosis. Continue with Protonix  twice daily and outpatient GI follow-up.   Lower extremity edema Secondary to hypoalbuminemia from liver disease   Hypomagnesemia and hypokalemia Replacements ordered r, repeat levels wnl.  Repeat BMP in one week.     History of tobacco abuse   Severe malnutrition -Dietitian recommendations (11/11): Continue 2 g sodium diet with 1.2 L fluid restriction. Continue Magic Cup berry and choc BID. Each supplement provides 290 Kcals and 9 grams of protein. Continue Ensure+HP TID. Each supplement provides 350 kcal and 20 grams of protein. Continue daily multivitamin PO, 100 mg thiamine  PO daily, and 1 mg folic acid  PO daily. Continue Juven BID  for wound healing.   Goals of care Patient has been seen by palliative care and continues to desire full scope medical care.    Pressure injury Mid sacrum. Unclear if present on admission.     Malnutrition Type:  Nutrition Problem: Severe Malnutrition Etiology: chronic illness (cirrhosis)   Malnutrition Characteristics:  Signs/Symptoms: severe fat depletion, severe muscle depletion   Nutrition Interventions:  Interventions: Ensure Enlive (each supplement provides 350kcal and 20  grams of protein), MVI, Liberalize Diet, Magic cup  Estimated body mass index is 25.11 kg/m as calculated from the following:   Height as of this encounter: 5' 2 (1.575 m).   Weight as of this encounter: 62.3 kg.  Code Status: Full code DVT Prophylaxis:  SCDs Start: 10/03/23 2153   Level of Care: Level of care: Med-Surg Family Communication: None at bedside  Disposition Plan:     Remains inpatient appropriate: Pending  Procedures:  EGD Paracentesis  Consultants:   Palliative care medicine Gastroenterology Nephrology General surgery  Antimicrobials:   Anti-infectives (From admission, onward)    Start     Dose/Rate Route Frequency Ordered Stop   12/06/23 1930  fluconazole  (DIFLUCAN ) tablet 150 mg        150 mg Oral  Once 12/06/23 1831 12/06/23 1905   11/21/23 1115  sulfamethoxazole -trimethoprim  (BACTRIM  DS) 800-160 MG per tablet 1 tablet        1 tablet Oral Every 12 hours 11/21/23 1024 11/23/23 2117   11/20/23 1400  cefTRIAXone  (ROCEPHIN ) 1 g in sodium chloride  0.9 % 100 mL IVPB  Status:  Discontinued        1 g 200 mL/hr over 30 Minutes Intravenous Every 24 hours 11/20/23 1245 11/21/23 1024   11/12/23 1145  rifaximin  (XIFAXAN ) tablet 550 mg  Status:  Discontinued        550 mg Oral 2 times daily 11/12/23 1058 11/22/23 1628   10/29/23 0000  rifaximin  (XIFAXAN ) 550 MG TABS tablet        550 mg Oral 2 times daily 10/29/23 1129     10/25/23 1415  rifaximin  (XIFAXAN ) tablet 550 mg  Status:  Discontinued        550 mg Oral 2 times daily 10/25/23 1315 11/10/23 1244   10/12/23 1000  metroNIDAZOLE  (FLAGYL ) tablet 500 mg        500 mg Oral Every 12 hours 10/12/23 0734 10/22/23 2125   10/12/23 0830  cefTRIAXone  (ROCEPHIN ) 2 g in sodium chloride  0.9 % 100 mL IVPB        2 g 200 mL/hr over 30 Minutes Intravenous Every 24 hours 10/12/23 0734 10/22/23 1636   10/07/23 1000  fluconazole  (DIFLUCAN ) tablet 100 mg  Status:  Discontinued        100 mg Oral Daily 10/07/23 0853 10/07/23  0855   10/07/23 1000  fluconazole  (DIFLUCAN ) tablet 150 mg        150 mg Oral Daily 10/07/23 0855 10/07/23 1014   10/04/23 1000  cefTRIAXone  (ROCEPHIN ) 2 g in sodium chloride  0.9 % 100 mL IVPB        2 g 200 mL/hr over 30 Minutes Intravenous Every 24 hours 10/03/23 2154 10/08/23 0854   10/03/23 2015  cefTRIAXone  (ROCEPHIN ) 1 g in sodium chloride  0.9 % 100 mL IVPB        1 g 200 mL/hr over 30 Minutes Intravenous  Once 10/03/23 2010 10/03/23 2212        Medications  Scheduled Meds:  sodium chloride    Intravenous Once  calcium  carbonate  1 tablet Oral TID WC   feeding supplement  237 mL Oral TID BM   folic acid   1 mg Oral Daily   furosemide   40 mg Oral BID   gabapentin   600 mg Oral TID   lactulose   30 g Oral TID   lidocaine   2 patch Transdermal Q24H   lidocaine  (PF)  10 mL Other Once   midodrine   20 mg Oral TID WC   multivitamin with minerals  1 tablet Oral Daily   nutrition supplement (JUVEN)  1 packet Oral BID BM   pantoprazole   40 mg Oral BID   potassium chloride   40 mEq Oral Daily   QUEtiapine   25 mg Oral QHS   thiamine   100 mg Oral Daily   triamcinolone  0.1 % cream : eucerin   Topical Daily   Continuous Infusions: PRN Meds:.albuterol , alum & mag hydroxide-simeth, antiseptic oral rinse, artificial tears, benzonatate , HYDROmorphone  (DILAUDID ) injection, hydrOXYzine , melatonin, Muscle Rub, ondansetron , mouth rinse, oxyCODONE , phenol, prochlorperazine , simethicone , witch hazel-glycerin     Subjective:   Joan Wood was seen and examined today.  Abdomen Is more distended. No abdominal pain , nausea or vomiting.   Objective:   Vitals:   01/03/24 0317 01/03/24 0727 01/03/24 1145 01/03/24 1518  BP: 110/69 103/65 112/70 114/62  Pulse: 95 95  90  Resp: 18 17  18   Temp: 98.5 F (36.9 C) 98.7 F (37.1 C)  99.1 F (37.3 C)  TempSrc: Oral Oral  Oral  SpO2: 98% 97%  100%  Weight:      Height:        Intake/Output Summary (Last 24 hours) at 01/03/2024 1553 Last data  filed at 01/03/2024 0840 Gross per 24 hour  Intake 477 ml  Output --  Net 477 ml   Filed Weights   12/24/23 0625 12/25/23 0500 12/28/23 0500  Weight: 61.8 kg 61.9 kg 62.3 kg     Exam General exam: Appears calm and comfortable  Respiratory system: Clear to auscultation. Respiratory effort normal. Cardiovascular system: S1 & S2 heard, RRR.  Gastrointestinal system: Abdomen is soft, distended, bs+ Central nervous system: Alert and oriented. No focal neurological deficits. Extremities: mild pedal edema.  Skin: No rashes,  Psychiatry:  Mood & affect appropriate.     Data Reviewed:  I have personally reviewed following labs and imaging studies   CBC Lab Results  Component Value Date   WBC 4.9 01/02/2024   RBC 2.86 (L) 01/02/2024   HGB 8.9 (L) 01/02/2024   HCT 25.7 (L) 01/02/2024   MCV 89.9 01/02/2024   MCH 31.1 01/02/2024   PLT 149 (L) 01/02/2024   MCHC 34.6 01/02/2024   RDW 14.5 01/02/2024   LYMPHSABS 0.9 12/27/2023   MONOABS 1.1 (H) 12/27/2023   EOSABS 0.1 12/27/2023   BASOSABS 0.0 12/27/2023     Last metabolic panel Lab Results  Component Value Date   NA 126 (L) 01/02/2024   K 3.5 01/02/2024   CL 91 (L) 01/02/2024   CO2 25 01/02/2024   BUN 10 01/02/2024   CREATININE 0.51 01/02/2024   GLUCOSE 114 (H) 01/02/2024   GFRNONAA >60 01/02/2024   CALCIUM  7.6 (L) 01/02/2024   PHOS 4.2 12/20/2023   PROT 5.9 (L) 01/02/2024   ALBUMIN  2.2 (L) 01/02/2024   LABGLOB 4.4 07/30/2023   AGRATIO 1.2 03/28/2022   BILITOT 0.9 01/02/2024   ALKPHOS 106 01/02/2024   AST 54 (H) 01/02/2024   ALT 25 01/02/2024   ANIONGAP 9 01/02/2024    CBG (  last 3)  No results for input(s): GLUCAP in the last 72 hours.    Coagulation Profile: No results for input(s): INR, PROTIME in the last 168 hours.   Radiology Studies: No results found.     Elgie Butter M.D. [facility] 01/03/2024, 3:53 PM  Available via Epic secure chat 7am-7pm After 7 pm, please refer to  night coverage provider listed on amion.    "

## 2024-01-03 NOTE — Plan of Care (Signed)
 Gets anxious about her medication.  Will be receiving pain meds and while receiving meds will be asking when she can get the next dose of pain meds and if it can be brought to her a little early.  Was reminded that she may not even need the medication when she can have it again so we just have to see if she will even need it and she can ask at that time.  When time was due that she could have more meds, she was sleeping and upon waking, said her pain was a 4 so she did not even need more medication.  At one time, she said her pain was low to none but said I should just give it to her now so she won't be hurting.  Was reminded that we cannot do that and pain meds are ordered according to her pain scale.    Problem: Health Behavior/Discharge Planning: Goal: Ability to manage health-related needs will improve Outcome: Progressing   Problem: Clinical Measurements: Goal: Will remain free from infection Outcome: Progressing   Problem: Clinical Measurements: Goal: Diagnostic test results will improve Outcome: Progressing   Problem: Clinical Measurements: Goal: Respiratory complications will improve Outcome: Progressing   Problem: Clinical Measurements: Goal: Cardiovascular complication will be avoided Outcome: Progressing   Problem: Activity: Goal: Risk for activity intolerance will decrease Outcome: Progressing   Problem: Nutrition: Goal: Adequate nutrition will be maintained Outcome: Progressing   Problem: Coping: Goal: Level of anxiety will decrease Outcome: Progressing

## 2024-01-03 NOTE — Plan of Care (Signed)

## 2024-01-04 ENCOUNTER — Inpatient Hospital Stay (HOSPITAL_COMMUNITY)

## 2024-01-04 DIAGNOSIS — E871 Hypo-osmolality and hyponatremia: Secondary | ICD-10-CM | POA: Diagnosis not present

## 2024-01-04 DIAGNOSIS — E43 Unspecified severe protein-calorie malnutrition: Secondary | ICD-10-CM | POA: Diagnosis not present

## 2024-01-04 DIAGNOSIS — K7290 Hepatic failure, unspecified without coma: Secondary | ICD-10-CM | POA: Diagnosis not present

## 2024-01-04 DIAGNOSIS — K7460 Unspecified cirrhosis of liver: Secondary | ICD-10-CM | POA: Diagnosis not present

## 2024-01-04 DIAGNOSIS — F1090 Alcohol use, unspecified, uncomplicated: Secondary | ICD-10-CM | POA: Diagnosis not present

## 2024-01-04 HISTORY — PX: IR PARACENTESIS: IMG2679

## 2024-01-04 MED ORDER — LIDOCAINE-EPINEPHRINE 1 %-1:100000 IJ SOLN
INTRAMUSCULAR | Status: AC
  Filled 2024-01-04: qty 20

## 2024-01-04 MED ORDER — POTASSIUM CHLORIDE CRYS ER 20 MEQ PO TBCR
20.0000 meq | EXTENDED_RELEASE_TABLET | Freq: Every day | ORAL | Status: AC
  Administered 2024-01-05 – 2024-02-15 (×42): 20 meq via ORAL
  Filled 2024-01-04 (×36): qty 1

## 2024-01-04 MED ORDER — HYDROMORPHONE HCL 1 MG/ML IJ SOLN
0.5000 mg | Freq: Four times a day (QID) | INTRAMUSCULAR | Status: DC | PRN
  Administered 2024-01-04 – 2024-01-11 (×19): 0.5 mg via INTRAVENOUS
  Filled 2024-01-04 (×9): qty 0.5

## 2024-01-04 MED ORDER — LIDOCAINE-EPINEPHRINE 1 %-1:100000 IJ SOLN
20.0000 mL | Freq: Once | INTRAMUSCULAR | Status: AC
  Administered 2024-01-04: 10 mL
  Filled 2024-01-04: qty 20

## 2024-01-04 NOTE — Procedures (Signed)
 PROCEDURE SUMMARY:  Successful image-guided paracentesis from the right lower abdomen.  Yielded 4.8 liters of hazy yellow fluid.  No immediate complications.  EBL: zero Patient tolerated well.   Specimen not sent for labs.  Please see imaging section of Epic for full dictation.  Lannie Heaps B Kalysta Kneisley NP 01/04/2024 12:09 PM

## 2024-01-04 NOTE — Progress Notes (Signed)
 "        [facility]                                                                               Joan Wood, is a 53 y.o. female, DOB - 20-Sep-1970, FMW:[id number] Admit date - 10/03/2023    Outpatient Primary MD for the patient is Bacchus, Meade PEDLAR, FNP  LOS - 93  days    Brief summary   53 y.o. female with a history of decompensated liver cirrhosis, PUD, gastric ulcer perforation status post ex lap, GERD, pancreatitis, obstructive sleep apnea. Patient presented secondary to nausea, coffee-ground emesis, bloody diarrhea with concern for upper GI bleed. 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 Hospitalization complicated by hepatic encephalopathy treated with lactulose  and rifaximin . Patient also requiring recurrent paracenteses for her recurrent ascites.   Assessment & Plan       Decompensated alcoholic cirrhosis with  ascites Paracentesis as needed for symptoms Patient wants to know if she can get peritoneal catheter for intermittent paracentesis. IR paracentesis ordered and pending.    History of hepatic encephalopathy Continue with lactulose .  Patient reports having loose bowel movements.  She is alert and oriented at this time but forgetful.    Acute metabolic encephalopathy probably secondary to hepatic encephalopathy Transient alteration of awareness No signs of infection-, She is alert and oriented to person and place.    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   Left eye stye No associated erythema or eye pain Continue with erythromycin .    AKI Secondary to ATN from contrast Nephrology consulted and signed off    Appendicitis earlier during the admission General surgery  consulted unfortunately not a candidate for surgical management due to high risk.  Patient was recommended conservative management with antibiotics, patient just completed the antibiotic regimen.    Hypotension Stable  patient is on midodrine   continue the same     Chronic hyponatremia Probably secondary to bursitis Patient's baseline sodium appears to be between 125-128. Continue with weight restriction.   Continue with lasix  40 mg BID.     Acute upper GI bleed Acute esophageal necrosis Acute blood loss anemia S/p a total of 4 units of PRBC transfusion. GI consulted patient underwent endoscopy in September showing evidence of grade D esophagitis in addition for acute esophageal necrosis. Continue with Protonix  twice daily and outpatient GI follow-up.   Lower extremity edema Secondary to hypoalbuminemia from liver disease   Hypomagnesemia and hypokalemia Replacements ordered r, repeat levels wnl.  Repeat BMP in one week.     History of tobacco abuse   Severe malnutrition -Dietitian recommendations (11/11): Continue 2 g sodium diet with 1.2 L fluid restriction. Continue Magic Cup berry and choc BID. Each supplement provides 290 Kcals and 9 grams of protein. Continue Ensure+HP TID. Each supplement provides 350 kcal and 20 grams of protein. Continue daily multivitamin PO, 100 mg thiamine  PO daily, and 1 mg folic acid  PO daily. Continue Juven BID for  wound healing.   Goals of care Patient has been seen by palliative care and continues to desire full scope medical care.    Pressure injury Mid sacrum. Unclear if present on admission.     Malnutrition Type:  Nutrition Problem: Severe Malnutrition Etiology: chronic illness (cirrhosis)   Malnutrition Characteristics:  Signs/Symptoms: severe fat depletion, severe muscle depletion   Nutrition Interventions:  Interventions: Ensure Enlive (each supplement provides 350kcal and 20 grams of protein), MVI,  Liberalize Diet, Magic cup  Estimated body mass index is 24.03 kg/m as calculated from the following:   Height as of this encounter: 5' 2 (1.575 m).   Weight as of this encounter: 59.6 kg.  Code Status: Full code DVT Prophylaxis:  SCDs Start: 10/03/23 2153   Level of Care: Level of care: Med-Surg Family Communication: None at bedside  Disposition Plan:     Remains inpatient appropriate: Pending placement.   Procedures:  EGD Paracentesis  Consultants:   Palliative care medicine Gastroenterology Nephrology General surgery  Antimicrobials:   Anti-infectives (From admission, onward)    Start     Dose/Rate Route Frequency Ordered Stop   12/06/23 1930  fluconazole  (DIFLUCAN ) tablet 150 mg        150 mg Oral  Once 12/06/23 1831 12/06/23 1905   11/21/23 1115  sulfamethoxazole -trimethoprim  (BACTRIM  DS) 800-160 MG per tablet 1 tablet        1 tablet Oral Every 12 hours 11/21/23 1024 11/23/23 2117   11/20/23 1400  cefTRIAXone  (ROCEPHIN ) 1 g in sodium chloride  0.9 % 100 mL IVPB  Status:  Discontinued        1 g 200 mL/hr over 30 Minutes Intravenous Every 24 hours 11/20/23 1245 11/21/23 1024   11/12/23 1145  rifaximin  (XIFAXAN ) tablet 550 mg  Status:  Discontinued        550 mg Oral 2 times daily 11/12/23 1058 11/22/23 1628   10/29/23 0000  rifaximin  (XIFAXAN ) 550 MG TABS tablet        550 mg Oral 2 times daily 10/29/23 1129     10/25/23 1415  rifaximin  (XIFAXAN ) tablet 550 mg  Status:  Discontinued        550 mg Oral 2 times daily 10/25/23 1315 11/10/23 1244   10/12/23 1000  metroNIDAZOLE  (FLAGYL ) tablet 500 mg        500 mg Oral Every 12 hours 10/12/23 0734 10/22/23 2125   10/12/23 0830  cefTRIAXone  (ROCEPHIN ) 2 g in sodium chloride  0.9 % 100 mL IVPB        2 g 200 mL/hr over 30 Minutes Intravenous Every 24 hours 10/12/23 0734 10/22/23 1636   10/07/23 1000  fluconazole  (DIFLUCAN ) tablet 100 mg  Status:  Discontinued        100 mg Oral Daily 10/07/23 0853 10/07/23 0855    10/07/23 1000  fluconazole  (DIFLUCAN ) tablet 150 mg        150 mg Oral Daily 10/07/23 0855 10/07/23 1014   10/04/23 1000  cefTRIAXone  (ROCEPHIN ) 2 g in sodium chloride  0.9 % 100 mL IVPB        2 g 200 mL/hr over 30 Minutes Intravenous Every 24 hours 10/03/23 2154 10/08/23 0854   10/03/23 2015  cefTRIAXone  (ROCEPHIN ) 1 g in sodium chloride  0.9 % 100 mL IVPB        1 g 200 mL/hr over 30 Minutes Intravenous  Once 10/03/23 2010 10/03/23 2212        Medications  Scheduled Meds:  sodium chloride    Intravenous Once  calcium  carbonate  1 tablet Oral TID WC   feeding supplement  237 mL Oral TID BM   folic acid   1 mg Oral Daily   furosemide   40 mg Oral BID   gabapentin   600 mg Oral TID   lactulose   30 g Oral TID   lidocaine   2 patch Transdermal Q24H   midodrine   20 mg Oral TID WC   multivitamin with minerals  1 tablet Oral Daily   nutrition supplement (JUVEN)  1 packet Oral BID BM   pantoprazole   40 mg Oral BID   potassium chloride   40 mEq Oral Daily   QUEtiapine   25 mg Oral QHS   thiamine   100 mg Oral Daily   triamcinolone  0.1 % cream : eucerin   Topical Daily   Continuous Infusions: PRN Meds:.albuterol , alum & mag hydroxide-simeth, antiseptic oral rinse, artificial tears, benzonatate , HYDROmorphone  (DILAUDID ) injection, hydrOXYzine , melatonin, Muscle Rub, ondansetron , mouth rinse, oxyCODONE , phenol, prochlorperazine , simethicone , witch hazel-glycerin     Subjective:   Joan Wood was seen and examined today.  Patient is teary today , requesting for paracentesis.   Objective:   Vitals:   01/04/24 0352 01/04/24 0358 01/04/24 0816 01/04/24 1201  BP: 120/64  113/65 126/70  Pulse: 93  98   Resp: 14  16   Temp: 97.9 F (36.6 C)  98.7 F (37.1 C)   TempSrc: Oral     SpO2: 98%  98%   Weight:  59.6 kg    Height:        Intake/Output Summary (Last 24 hours) at 01/04/2024 1307 Last data filed at 01/04/2024 0510 Gross per 24 hour  Intake 890 ml  Output --  Net 890 ml    Filed Weights   12/25/23 0500 12/28/23 0500 01/04/24 0358  Weight: 61.9 kg 62.3 kg 59.6 kg     Exam General exam: Appears calm and comfortable  Respiratory system: Clear to auscultation. Respiratory effort normal. Cardiovascular system: S1 & S2 heard, RRR.  Gastrointestinal system: Abdomen is soft distended, bs+ Central nervous system: Alert and oriented. Memory deficits.  Extremities: mild pedal edema.  Skin: No rashes,  Psychiatry:  Mood & affect appropriate.      Data Reviewed:  I have personally reviewed following labs and imaging studies   CBC Lab Results  Component Value Date   WBC 4.9 01/02/2024   RBC 2.86 (L) 01/02/2024   HGB 8.9 (L) 01/02/2024   HCT 25.7 (L) 01/02/2024   MCV 89.9 01/02/2024   MCH 31.1 01/02/2024   PLT 149 (L) 01/02/2024   MCHC 34.6 01/02/2024   RDW 14.5 01/02/2024   LYMPHSABS 0.9 12/27/2023   MONOABS 1.1 (H) 12/27/2023   EOSABS 0.1 12/27/2023   BASOSABS 0.0 12/27/2023     Last metabolic panel Lab Results  Component Value Date   NA 127 (L) 01/03/2024   K 4.4 01/03/2024   CL 97 (L) 01/03/2024   CO2 21 (L) 01/03/2024   BUN 19 01/03/2024   CREATININE 0.63 01/03/2024   GLUCOSE 98 01/03/2024   GFRNONAA >60 01/03/2024   CALCIUM  8.0 (L) 01/03/2024   PHOS 4.2 12/20/2023   PROT 5.9 (L) 01/02/2024   ALBUMIN  2.2 (L) 01/02/2024   LABGLOB 4.4 07/30/2023   AGRATIO 1.2 03/28/2022   BILITOT 0.9 01/02/2024   ALKPHOS 106 01/02/2024   AST 54 (H) 01/02/2024   ALT 25 01/02/2024   ANIONGAP 9 01/03/2024    CBG (last 3)  No results for input(s): GLUCAP in the last 72 hours.  Coagulation Profile: No results for input(s): INR, PROTIME in the last 168 hours.   Radiology Studies: No results found.     Elgie Butter M.D. [facility] 01/04/2024, 1:07 PM  Available via Epic secure chat 7am-7pm After 7 pm, please refer to night coverage provider listed on amion.    "

## 2024-01-04 NOTE — Plan of Care (Signed)
  Problem: Health Behavior/Discharge Planning: Goal: Ability to manage health-related needs will improve Outcome: Adequate for Discharge   Problem: Clinical Measurements: Goal: Ability to maintain clinical measurements within normal limits will improve Outcome: Adequate for Discharge Goal: Will remain free from infection Outcome: Adequate for Discharge Goal: Diagnostic test results will improve Outcome: Adequate for Discharge Goal: Respiratory complications will improve Outcome: Adequate for Discharge Goal: Cardiovascular complication will be avoided Outcome: Adequate for Discharge   Problem: Activity: Goal: Risk for activity intolerance will decrease Outcome: Adequate for Discharge   Problem: Nutrition: Goal: Adequate nutrition will be maintained Outcome: Adequate for Discharge   Problem: Coping: Goal: Level of anxiety will decrease Outcome: Adequate for Discharge   Problem: Elimination: Goal: Will not experience complications related to bowel motility Outcome: Adequate for Discharge Goal: Will not experience complications related to urinary retention Outcome: Adequate for Discharge   Problem: Pain Managment: Goal: General experience of comfort will improve and/or be controlled Outcome: Adequate for Discharge   Problem: Safety: Goal: Ability to remain free from injury will improve Outcome: Adequate for Discharge   Problem: Skin Integrity: Goal: Risk for impaired skin integrity will decrease Outcome: Adequate for Discharge

## 2024-01-04 NOTE — Plan of Care (Signed)
" °  Problem: Health Behavior/Discharge Planning: Goal: Ability to manage health-related needs will improve Outcome: Progressing   Problem: Clinical Measurements: Goal: Will remain free from infection Outcome: Progressing   Problem: Activity: Goal: Risk for activity intolerance will decrease Outcome: Progressing   Problem: Elimination: Goal: Will not experience complications related to bowel motility Outcome: Progressing Goal: Will not experience complications related to urinary retention Outcome: Progressing   Problem: Pain Managment: Goal: General experience of comfort will improve and/or be controlled Outcome: Progressing   Problem: Safety: Goal: Ability to remain free from injury will improve Outcome: Progressing   "

## 2024-01-04 NOTE — TOC Progression Note (Signed)
 Transition of Care [facility]) - Progression Note    Patient Details  Name: Joan Wood MRN: [id number] Date of Birth: [birth year]  Transition of Care [facility]) CM/SW Contact  [name], [name] Phone Number: 01/04/2024, 11:05 AM  Clinical Narrative:   CSW continuing to follow for disposition. No bed offers at this time. CSW to follow.    Expected Discharge Plan: Skilled Nursing Facility Barriers to Discharge: Homeless with medical needs, Inadequate or no insurance, Continued Medical Work up, Unsafe home situation               Expected Discharge Plan and Services In-house Referral: Clinical Social Work Discharge Planning Services: CM Consult Post Acute Care Choice: Home Health Living arrangements for the past 2 months: Single Family Home                 DME Arranged: Walker rolling   Date DME Agency Contacted: 10/08/23   Representative spoke with at DME Agency: [name]             Social Drivers of Health (SDOH) Interventions SDOH Screenings   Food Insecurity: No Food Insecurity (10/03/2023)  Housing: Low Risk (10/03/2023)  Transportation Needs: No Transportation Needs (10/03/2023)  Recent Concern: Transportation Needs - Unmet Transportation Needs (08/20/2023)  Utilities: Not At Risk (10/03/2023)  Depression (PHQ2-9): High Risk (07/30/2023)  Social Connections: Socially Isolated (10/03/2023)  Tobacco Use: High Risk (10/04/2023)    Readmission Risk Interventions    10/04/2023    9:02 AM 09/03/2023   12:31 PM 09/02/2023    9:01 AM  Readmission Risk Prevention Plan  Transportation Screening Complete Complete Complete  PCP or Specialist Appt within 3-5 Days   Complete  HRI or Home Care Consult   Complete  Social Work Consult for Recovery Care Planning/Counseling   Complete  Palliative Care Screening   Not Applicable  Medication Review Oceanographer) Complete Complete Complete  HRI or Home Care Consult Complete Complete   SW Recovery Care/Counseling Consult  Complete Complete   Palliative Care Screening Not Applicable Not Applicable   Skilled Nursing Facility Not Applicable Patient Refused

## 2024-01-05 DIAGNOSIS — E871 Hypo-osmolality and hyponatremia: Secondary | ICD-10-CM | POA: Diagnosis not present

## 2024-01-05 DIAGNOSIS — K922 Gastrointestinal hemorrhage, unspecified: Secondary | ICD-10-CM | POA: Diagnosis not present

## 2024-01-05 DIAGNOSIS — K7290 Hepatic failure, unspecified without coma: Secondary | ICD-10-CM | POA: Diagnosis not present

## 2024-01-05 DIAGNOSIS — K7031 Alcoholic cirrhosis of liver with ascites: Secondary | ICD-10-CM | POA: Diagnosis not present

## 2024-01-05 LAB — BASIC METABOLIC PANEL WITH GFR
Anion gap: 9 (ref 5–15)
BUN: 17 mg/dL (ref 6–20)
CO2: 26 mmol/L (ref 22–32)
Calcium: 8.2 mg/dL — ABNORMAL LOW (ref 8.9–10.3)
Chloride: 95 mmol/L — ABNORMAL LOW (ref 98–111)
Creatinine, Ser: 0.53 mg/dL (ref 0.44–1.00)
GFR, Estimated: >60 mL/min
Glucose, Bld: 81 mg/dL (ref 70–99)
Potassium: 3.3 mmol/L — ABNORMAL LOW (ref 3.5–5.1)
Sodium: 129 mmol/L — ABNORMAL LOW (ref 135–145)

## 2024-01-05 MED ORDER — POTASSIUM CHLORIDE CRYS ER 20 MEQ PO TBCR
40.0000 meq | EXTENDED_RELEASE_TABLET | Freq: Once | ORAL | Status: AC
  Administered 2024-01-05: 40 meq via ORAL
  Filled 2024-01-05: qty 2

## 2024-01-05 MED ORDER — OXYCODONE HCL 5 MG PO TABS
5.0000 mg | ORAL_TABLET | ORAL | Status: DC | PRN
  Administered 2024-01-05 – 2024-02-02 (×122): 5 mg via ORAL
  Filled 2024-01-05 (×96): qty 1

## 2024-01-05 NOTE — Plan of Care (Signed)
 Pt only had one small BM overnight. Pt rested well overnight, waking up to use the [facility] to urination and c/o pain at that time.   Problem: Health Behavior/Discharge Planning: Goal: Ability to manage health-related needs will improve Outcome: Progressing   Problem: Clinical Measurements: Goal: Will remain free from infection Outcome: Progressing Goal: Respiratory complications will improve Outcome: Progressing   Problem: Activity: Goal: Risk for activity intolerance will decrease Outcome: Progressing   Problem: Coping: Goal: Level of anxiety will decrease Outcome: Progressing   Problem: Elimination: Goal: Will not experience complications related to bowel motility Outcome: Progressing Goal: Will not experience complications related to urinary retention Outcome: Progressing   Problem: Pain Managment: Goal: General experience of comfort will improve and/or be controlled Outcome: Progressing

## 2024-01-05 NOTE — Progress Notes (Signed)
 "        [facility]                                                                               Joan Wood, is a 53 y.o. female, DOB - 11-Dec-1970, FMW:[id number] Admit date - 10/03/2023    Outpatient Primary MD for the patient is Bacchus, Meade PEDLAR, FNP  LOS - 94  days    Brief summary   53 y.o. female with a history of decompensated liver cirrhosis, PUD, gastric ulcer perforation status post ex lap, GERD, pancreatitis, obstructive sleep apnea. Patient presented secondary to nausea, coffee-ground emesis, bloody diarrhea with concern for upper GI bleed. 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 Hospitalization complicated by hepatic encephalopathy treated with lactulose  and rifaximin . Patient also requiring recurrent paracenteses for her recurrent ascites.   Assessment & Plan       Decompensated alcoholic cirrhosis with  ascites Paracentesis as needed for symptoms She underwent paracentesis and 4.8l it fluid removed.    History of hepatic encephalopathy Continue with lactulose .  Patient reports having loose bowel movements.  She is alert and oriented at this time.     Acute metabolic encephalopathy probably secondary to hepatic encephalopathy Transient alteration of awareness No signs of infection-, She is alert and oriented to person and place.    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   Left eye stye No associated erythema or eye pain Continue with erythromycin .    AKI Secondary to ATN from contrast Nephrology consulted and signed off    Appendicitis earlier during the admission General surgery consulted unfortunately not a candidate for surgical management due to high risk.   Patient was recommended conservative management with antibiotics, patient just completed the antibiotic regimen.    Hypotension Stable  patient is on midodrine   continue the same     Chronic hyponatremia Probably secondary to bursitis Patient's baseline sodium appears to be between 125-128. Continue with weight restriction.   Continue with lasix  40 mg BID.     Acute upper GI bleed Acute esophageal necrosis Acute blood loss anemia S/p a total of 4 units of PRBC transfusion. GI consulted patient underwent endoscopy in September showing evidence of grade D esophagitis in addition for acute esophageal necrosis. Continue with Protonix  twice daily and outpatient GI follow-up.   Lower extremity edema Secondary to hypoalbuminemia from liver disease   Hypomagnesemia and hypokalemia Replacements ordered r, repeat levels wnl.  Repeat BMP in one week.     History of tobacco abuse   Severe malnutrition -Dietitian recommendations (11/11): Continue 2 g sodium diet with 1.2 L fluid restriction. Continue Magic Cup berry and choc BID. Each supplement provides 290 Kcals and 9 grams of protein. Continue Ensure+HP TID. Each supplement provides 350 kcal and 20 grams of protein. Continue daily multivitamin PO, 100 mg thiamine  PO daily, and 1 mg folic acid  PO daily. Continue Juven BID for wound healing.   Goals of care Patient has been seen  by palliative care and continues to desire full scope medical care.    Pressure injury Mid sacrum. Unclear if present on admission.     Malnutrition Type:  Nutrition Problem: Severe Malnutrition Etiology: chronic illness (cirrhosis)   Malnutrition Characteristics:  Signs/Symptoms: severe fat depletion, severe muscle depletion   Nutrition Interventions:  Interventions: Ensure Enlive (each supplement provides 350kcal and 20 grams of protein), MVI, Liberalize Diet, Magic cup  Estimated body mass index is 23.25 kg/m as calculated from  the following:   Height as of this encounter: 5' 2 (1.575 m).   Weight as of this encounter: 57.7 kg.  Code Status: Full code DVT Prophylaxis:  SCDs Start: 10/03/23 2153   Level of Care: Level of care: Med-Surg Family Communication: None at bedside  Disposition Plan:     Remains inpatient appropriate: Pending placement.   Procedures:  EGD Paracentesis  Consultants:   Palliative care medicine Gastroenterology Nephrology General surgery  Antimicrobials:   Anti-infectives (From admission, onward)    Start     Dose/Rate Route Frequency Ordered Stop   12/06/23 1930  fluconazole  (DIFLUCAN ) tablet 150 mg        150 mg Oral  Once 12/06/23 1831 12/06/23 1905   11/21/23 1115  sulfamethoxazole -trimethoprim  (BACTRIM  DS) 800-160 MG per tablet 1 tablet        1 tablet Oral Every 12 hours 11/21/23 1024 11/23/23 2117   11/20/23 1400  cefTRIAXone  (ROCEPHIN ) 1 g in sodium chloride  0.9 % 100 mL IVPB  Status:  Discontinued        1 g 200 mL/hr over 30 Minutes Intravenous Every 24 hours 11/20/23 1245 11/21/23 1024   11/12/23 1145  rifaximin  (XIFAXAN ) tablet 550 mg  Status:  Discontinued        550 mg Oral 2 times daily 11/12/23 1058 11/22/23 1628   10/29/23 0000  rifaximin  (XIFAXAN ) 550 MG TABS tablet        550 mg Oral 2 times daily 10/29/23 1129     10/25/23 1415  rifaximin  (XIFAXAN ) tablet 550 mg  Status:  Discontinued        550 mg Oral 2 times daily 10/25/23 1315 11/10/23 1244   10/12/23 1000  metroNIDAZOLE  (FLAGYL ) tablet 500 mg        500 mg Oral Every 12 hours 10/12/23 0734 10/22/23 2125   10/12/23 0830  cefTRIAXone  (ROCEPHIN ) 2 g in sodium chloride  0.9 % 100 mL IVPB        2 g 200 mL/hr over 30 Minutes Intravenous Every 24 hours 10/12/23 0734 10/22/23 1636   10/07/23 1000  fluconazole  (DIFLUCAN ) tablet 100 mg  Status:  Discontinued        100 mg Oral Daily 10/07/23 0853 10/07/23 0855   10/07/23 1000  fluconazole  (DIFLUCAN ) tablet 150 mg        150 mg Oral Daily 10/07/23 0855  10/07/23 1014   10/04/23 1000  cefTRIAXone  (ROCEPHIN ) 2 g in sodium chloride  0.9 % 100 mL IVPB        2 g 200 mL/hr over 30 Minutes Intravenous Every 24 hours 10/03/23 2154 10/08/23 0854   10/03/23 2015  cefTRIAXone  (ROCEPHIN ) 1 g in sodium chloride  0.9 % 100 mL IVPB        1 g 200 mL/hr over 30 Minutes Intravenous  Once 10/03/23 2010 10/03/23 2212        Medications  Scheduled Meds:  sodium chloride    Intravenous Once   calcium  carbonate  1 tablet Oral TID WC  feeding supplement  237 mL Oral TID BM   folic acid   1 mg Oral Daily   furosemide   40 mg Oral BID   gabapentin   600 mg Oral TID   lactulose   30 g Oral TID   lidocaine   2 patch Transdermal Q24H   midodrine   20 mg Oral TID WC   multivitamin with minerals  1 tablet Oral Daily   nutrition supplement (JUVEN)  1 packet Oral BID BM   pantoprazole   40 mg Oral BID   potassium chloride   20 mEq Oral Daily   QUEtiapine   25 mg Oral QHS   thiamine   100 mg Oral Daily   triamcinolone  0.1 % cream : eucerin   Topical Daily   Continuous Infusions: PRN Meds:.albuterol , alum & mag hydroxide-simeth, antiseptic oral rinse, artificial tears, benzonatate , HYDROmorphone  (DILAUDID ) injection, hydrOXYzine , melatonin, Muscle Rub, ondansetron , mouth rinse, oxyCODONE , phenol, prochlorperazine , simethicone , witch hazel-glycerin     Subjective:   Joan Wood was seen and examined today.  She reports being more comfortable.  We discussed about her fluid intake and restrictions.  Objective:   Vitals:   01/04/24 1457 01/04/24 1923 01/05/24 0505 01/05/24 0836  BP: 122/64 (!) 113/59  110/73  Pulse: 95 86  92  Resp:  16    Temp: 98 F (36.7 C) 99.8 F (37.7 C)  98.7 F (37.1 C)  TempSrc:  Oral  Oral  SpO2: 100% 100%  100%  Weight:   57.7 kg   Height:        Intake/Output Summary (Last 24 hours) at 01/05/2024 0856 Last data filed at 01/05/2024 0500 Gross per 24 hour  Intake 747 ml  Output 1350 ml  Net -603 ml   Filed Weights    12/28/23 0500 01/04/24 0358 01/05/24 0505  Weight: 62.3 kg 59.6 kg 57.7 kg     Exam General exam: Appears calm and comfortable  Respiratory system: Clear to auscultation. Respiratory effort normal. Cardiovascular system: S1 & S2 heard, RRR.  Gastrointestinal system: Abdomen is soft and distended.  Central nervous system: Alert and oriented.  Extremities: Symmetric 5 x 5 power. Skin: No rashes,  Psychiatry: Mood appropriate.    Data Reviewed:  I have personally reviewed following labs and imaging studies   CBC Lab Results  Component Value Date   WBC 4.9 01/02/2024   RBC 2.86 (L) 01/02/2024   HGB 8.9 (L) 01/02/2024   HCT 25.7 (L) 01/02/2024   MCV 89.9 01/02/2024   MCH 31.1 01/02/2024   PLT 149 (L) 01/02/2024   MCHC 34.6 01/02/2024   RDW 14.5 01/02/2024   LYMPHSABS 0.9 12/27/2023   MONOABS 1.1 (H) 12/27/2023   EOSABS 0.1 12/27/2023   BASOSABS 0.0 12/27/2023     Last metabolic panel Lab Results  Component Value Date   NA 127 (L) 01/03/2024   K 4.4 01/03/2024   CL 97 (L) 01/03/2024   CO2 21 (L) 01/03/2024   BUN 19 01/03/2024   CREATININE 0.63 01/03/2024   GLUCOSE 98 01/03/2024   GFRNONAA >60 01/03/2024   CALCIUM  8.0 (L) 01/03/2024   PHOS 4.2 12/20/2023   PROT 5.9 (L) 01/02/2024   ALBUMIN  2.2 (L) 01/02/2024   LABGLOB 4.4 07/30/2023   AGRATIO 1.2 03/28/2022   BILITOT 0.9 01/02/2024   ALKPHOS 106 01/02/2024   AST 54 (H) 01/02/2024   ALT 25 01/02/2024   ANIONGAP 9 01/03/2024    CBG (last 3)  No results for input(s): GLUCAP in the last 72 hours.    Coagulation Profile: No  results for input(s): INR, PROTIME in the last 168 hours.   Radiology Studies: IR Paracentesis Result Date: 01/04/2024 INDICATION: 53 year old female with history of decompensated alcoholic cirrhosis with recurrent ascites. Received request for therapeutic paracentesis. EXAM: ULTRASOUND GUIDED THERAPEUTIC PARACENTESIS MEDICATIONS: 10 mL 1% lidocaine  with epinephrine  COMPLICATIONS:  None immediate. PROCEDURE: Informed written consent was obtained from the patient after a discussion of the risks, benefits and alternatives to treatment. A timeout was performed prior to the initiation of the procedure. Initial ultrasound scanning demonstrates a moderate amount of ascites within the right lower abdominal quadrant. The right lower abdomen was prepped and draped in the usual sterile fashion. 1% lidocaine  with epinephrine  was used for local anesthesia. Following this, a 19 gauge, 7-cm, Yueh catheter was introduced. An ultrasound image was saved for documentation purposes. The paracentesis was performed. The catheter was removed and a dressing was applied. The patient tolerated the procedure well without immediate post procedural complication. FINDINGS: A total of approximately 4.8 L of hazy yellow fluid was removed. IMPRESSION: Successful ultrasound-guided paracentesis yielding 4.8 L of peritoneal fluid. Performed by: Kristi Davenport, NP PLAN: The patient has required >/=2 paracenteses in a 30 day period and a formal evaluation by the [facility] has been arranged. Thom Hall, MD [facility] Radiology Electronically Signed   By: Thom Hall M.D.   On: 01/04/2024 18:27       Elgie Butter M.D. [facility] 01/05/2024, 8:56 AM  Available via Epic secure chat 7am-7pm After 7 pm, please refer to night coverage provider listed on amion.    "

## 2024-01-06 DIAGNOSIS — K7290 Hepatic failure, unspecified without coma: Secondary | ICD-10-CM | POA: Diagnosis not present

## 2024-01-06 DIAGNOSIS — K7031 Alcoholic cirrhosis of liver with ascites: Secondary | ICD-10-CM | POA: Diagnosis not present

## 2024-01-06 DIAGNOSIS — E871 Hypo-osmolality and hyponatremia: Secondary | ICD-10-CM | POA: Diagnosis not present

## 2024-01-06 DIAGNOSIS — K922 Gastrointestinal hemorrhage, unspecified: Secondary | ICD-10-CM | POA: Diagnosis not present

## 2024-01-06 NOTE — Progress Notes (Signed)
 "        [facility]                                                                               Joan Wood, is a 53 y.o. female, DOB - 07-06-1970, FMW:[id number] Admit date - 10/03/2023    Outpatient Primary MD for the patient is Joan, Meade PEDLAR, FNP  LOS - 95  days    Brief summary   53 y.o. female with a history of decompensated liver cirrhosis, PUD, gastric ulcer perforation status post ex lap, GERD, pancreatitis, obstructive sleep apnea. Patient presented secondary to nausea, coffee-ground emesis, bloody diarrhea with concern for upper GI bleed. 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 Hospitalization complicated by hepatic encephalopathy treated with lactulose  and rifaximin . Patient also requiring recurrent paracenteses for her recurrent ascites.   Assessment & Plan   Decompensated alcoholic cirrhosis with  ascites Paracentesis as needed for symptoms She underwent paracentesis  on 12/26 and 4.8l it fluid removed.    History of hepatic encephalopathy Continue with lactulose .  Patient reports having loose bowel movements.  She is alert and oriented at this time.     Acute metabolic encephalopathy probably secondary to hepatic encephalopathy Transient alteration of awareness No signs of infection-, She is alert and oriented to person and place.    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   Left eye stye No associated erythema or eye pain Continue with erythromycin .    AKI Secondary to ATN from contrast Nephrology consulted and signed off    Appendicitis earlier during the admission General surgery consulted unfortunately not a candidate for surgical management due to high risk.   Patient was recommended conservative management with antibiotics, patient just completed the antibiotic regimen.    Hypotension Resolved.   patient is on midodrine   continue the same     Chronic hyponatremia Sodium of 129 .  Patient's baseline sodium appears to be between 125-128. Continue with weight restriction.   Continue with lasix  40 mg BID.     Acute upper GI bleed Acute esophageal necrosis Acute blood loss anemia S/p a total of 4 units of PRBC transfusion. GI consulted patient underwent endoscopy in September showing evidence of grade D esophagitis in addition for acute esophageal necrosis. Continue with Protonix  twice daily and outpatient GI follow-up.   Lower extremity edema Secondary to hypoalbuminemia from liver disease   Hypomagnesemia and hypokalemia Replacements ordered, repeat bmp in am.      History of tobacco abuse   Severe malnutrition -Dietitian recommendations (11/11): Continue 2 g sodium diet with 1.2 L fluid restriction. Continue Magic Cup berry and choc BID. Each supplement provides 290 Kcals and 9 grams of protein. Continue Ensure+HP TID. Each supplement provides 350 kcal and 20 grams of protein. Continue daily multivitamin PO, 100 mg thiamine  PO daily, and 1 mg folic acid  PO daily. Continue Juven BID for wound healing.   Goals of care Patient has been seen by palliative care and  continues to desire full scope medical care.    Pressure injury Mid sacrum. Unclear if present on admission.     Malnutrition Type:  Nutrition Problem: Severe Malnutrition Etiology: chronic illness (cirrhosis)   Malnutrition Characteristics:  Signs/Symptoms: severe fat depletion, severe muscle depletion   Nutrition Interventions:  Interventions: Ensure Enlive (each supplement provides 350kcal and 20 grams of protein), MVI, Liberalize Diet, Magic cup  Estimated body mass index is 23.25 kg/m as calculated from the following:   Height as of this  encounter: 5' 2 (1.575 m).   Weight as of this encounter: 57.7 kg.  Code Status: Full code DVT Prophylaxis:  SCDs Start: 10/03/23 2153   Level of Care: Level of care: Med-Surg Family Communication: None at bedside  Disposition Plan:     Remains inpatient appropriate: Pending placement.   Procedures:  EGD Paracentesis  Consultants:   Palliative care medicine Gastroenterology Nephrology General surgery  Antimicrobials:   Anti-infectives (From admission, onward)    Start     Dose/Rate Route Frequency Ordered Stop   12/06/23 1930  fluconazole  (DIFLUCAN ) tablet 150 mg        150 mg Oral  Once 12/06/23 1831 12/06/23 1905   11/21/23 1115  sulfamethoxazole -trimethoprim  (BACTRIM  DS) 800-160 MG per tablet 1 tablet        1 tablet Oral Every 12 hours 11/21/23 1024 11/23/23 2117   11/20/23 1400  cefTRIAXone  (ROCEPHIN ) 1 g in sodium chloride  0.9 % 100 mL IVPB  Status:  Discontinued        1 g 200 mL/hr over 30 Minutes Intravenous Every 24 hours 11/20/23 1245 11/21/23 1024   11/12/23 1145  rifaximin  (XIFAXAN ) tablet 550 mg  Status:  Discontinued        550 mg Oral 2 times daily 11/12/23 1058 11/22/23 1628   10/29/23 0000  rifaximin  (XIFAXAN ) 550 MG TABS tablet        550 mg Oral 2 times daily 10/29/23 1129     10/25/23 1415  rifaximin  (XIFAXAN ) tablet 550 mg  Status:  Discontinued        550 mg Oral 2 times daily 10/25/23 1315 11/10/23 1244   10/12/23 1000  metroNIDAZOLE  (FLAGYL ) tablet 500 mg        500 mg Oral Every 12 hours 10/12/23 0734 10/22/23 2125   10/12/23 0830  cefTRIAXone  (ROCEPHIN ) 2 g in sodium chloride  0.9 % 100 mL IVPB        2 g 200 mL/hr over 30 Minutes Intravenous Every 24 hours 10/12/23 0734 10/22/23 1636   10/07/23 1000  fluconazole  (DIFLUCAN ) tablet 100 mg  Status:  Discontinued        100 mg Oral Daily 10/07/23 0853 10/07/23 0855   10/07/23 1000  fluconazole  (DIFLUCAN ) tablet 150 mg        150 mg Oral Daily 10/07/23 0855 10/07/23 1014   10/04/23 1000   cefTRIAXone  (ROCEPHIN ) 2 g in sodium chloride  0.9 % 100 mL IVPB        2 g 200 mL/hr over 30 Minutes Intravenous Every 24 hours 10/03/23 2154 10/08/23 0854   10/03/23 2015  cefTRIAXone  (ROCEPHIN ) 1 g in sodium chloride  0.9 % 100 mL IVPB        1 g 200 mL/hr over 30 Minutes Intravenous  Once 10/03/23 2010 10/03/23 2212        Medications  Scheduled Meds:  sodium chloride    Intravenous Once   calcium  carbonate  1 tablet Oral TID WC   feeding supplement  237  mL Oral TID BM   folic acid   1 mg Oral Daily   furosemide   40 mg Oral BID   gabapentin   600 mg Oral TID   lactulose   30 g Oral TID   lidocaine   2 patch Transdermal Q24H   midodrine   20 mg Oral TID WC   multivitamin with minerals  1 tablet Oral Daily   nutrition supplement (JUVEN)  1 packet Oral BID BM   pantoprazole   40 mg Oral BID   potassium chloride   20 mEq Oral Daily   QUEtiapine   25 mg Oral QHS   thiamine   100 mg Oral Daily   triamcinolone  0.1 % cream : eucerin   Topical Daily   Continuous Infusions: PRN Meds:.albuterol , alum & mag hydroxide-simeth, antiseptic oral rinse, artificial tears, benzonatate , HYDROmorphone  (DILAUDID ) injection, hydrOXYzine , melatonin, Muscle Rub, ondansetron , mouth rinse, oxyCODONE , phenol, prochlorperazine , simethicone , witch hazel-glycerin     Subjective:   Joan Wood was seen and examined today.  No new complaints this morning.  Objective:   Vitals:   01/05/24 0505 01/05/24 0836 01/06/24 0743 01/06/24 1516  BP:  110/73 121/77 115/62  Pulse:  92 98 93  Resp:   15 18  Temp:  98.7 F (37.1 C) 98.6 F (37 C) 98.7 F (37.1 C)  TempSrc:  Oral Oral Oral  SpO2:  100% 100% 100%  Weight: 57.7 kg     Height:        Intake/Output Summary (Last 24 hours) at 01/06/2024 1558 Last data filed at 01/06/2024 1530 Gross per 24 hour  Intake 720 ml  Output --  Net 720 ml   Filed Weights   12/28/23 0500 01/04/24 0358 01/05/24 0505  Weight: 62.3 kg 59.6 kg 57.7 kg     Exam General  exam: Appears calm and comfortable  Respiratory system: Clear to auscultation. Respiratory effort normal. Cardiovascular system: S1 & S2 heard, RRR. Gastrointestinal system: Abdomen is soft bs+ distended. Central nervous system: Alert and oriented. Extremities: trace edema Skin: No rashes Psychiatry: mood is appropriate.      Data Reviewed:  I have personally reviewed following labs and imaging studies   CBC Lab Results  Component Value Date   WBC 4.9 01/02/2024   RBC 2.86 (L) 01/02/2024   HGB 8.9 (L) 01/02/2024   HCT 25.7 (L) 01/02/2024   MCV 89.9 01/02/2024   MCH 31.1 01/02/2024   PLT 149 (L) 01/02/2024   MCHC 34.6 01/02/2024   RDW 14.5 01/02/2024   LYMPHSABS 0.9 12/27/2023   MONOABS 1.1 (H) 12/27/2023   EOSABS 0.1 12/27/2023   BASOSABS 0.0 12/27/2023     Last metabolic panel Lab Results  Component Value Date   NA 129 (L) 01/05/2024   K 3.3 (L) 01/05/2024   CL 95 (L) 01/05/2024   CO2 26 01/05/2024   BUN 17 01/05/2024   CREATININE 0.53 01/05/2024   GLUCOSE 81 01/05/2024   GFRNONAA >60 01/05/2024   CALCIUM  8.2 (L) 01/05/2024   PHOS 4.2 12/20/2023   PROT 5.9 (L) 01/02/2024   ALBUMIN  2.2 (L) 01/02/2024   LABGLOB 4.4 07/30/2023   AGRATIO 1.2 03/28/2022   BILITOT 0.9 01/02/2024   ALKPHOS 106 01/02/2024   AST 54 (H) 01/02/2024   ALT 25 01/02/2024   ANIONGAP 9 01/05/2024    CBG (last 3)  No results for input(s): GLUCAP in the last 72 hours.    Coagulation Profile: No results for input(s): INR, PROTIME in the last 168 hours.   Radiology Studies: No results found.  Elgie Butter M.D. [facility] 01/06/2024, 3:58 PM  Available via Epic secure chat 7am-7pm After 7 pm, please refer to night coverage provider listed on amion.    "

## 2024-01-07 DIAGNOSIS — E871 Hypo-osmolality and hyponatremia: Secondary | ICD-10-CM | POA: Diagnosis not present

## 2024-01-07 DIAGNOSIS — K922 Gastrointestinal hemorrhage, unspecified: Secondary | ICD-10-CM | POA: Diagnosis not present

## 2024-01-07 DIAGNOSIS — K7290 Hepatic failure, unspecified without coma: Secondary | ICD-10-CM | POA: Diagnosis not present

## 2024-01-07 DIAGNOSIS — K7031 Alcoholic cirrhosis of liver with ascites: Secondary | ICD-10-CM | POA: Diagnosis not present

## 2024-01-07 LAB — BASIC METABOLIC PANEL WITH GFR
Anion gap: 7 (ref 5–15)
BUN: 20 mg/dL (ref 6–20)
CO2: 25 mmol/L (ref 22–32)
Calcium: 8 mg/dL — ABNORMAL LOW (ref 8.9–10.3)
Chloride: 96 mmol/L — ABNORMAL LOW (ref 98–111)
Creatinine, Ser: 0.56 mg/dL (ref 0.44–1.00)
GFR, Estimated: >60 mL/min
Glucose, Bld: 95 mg/dL (ref 70–99)
Potassium: 3.7 mmol/L (ref 3.5–5.1)
Sodium: 128 mmol/L — ABNORMAL LOW (ref 135–145)

## 2024-01-07 LAB — PHOSPHORUS: Phosphorus: 3.3 mg/dL (ref 2.5–4.6)

## 2024-01-07 LAB — MAGNESIUM: Magnesium: 1.5 mg/dL — ABNORMAL LOW (ref 1.7–2.4)

## 2024-01-07 MED ORDER — MAGNESIUM SULFATE 4 GM/100ML IV SOLN
4.0000 g | Freq: Once | INTRAVENOUS | Status: AC
  Administered 2024-01-07: 4 g via INTRAVENOUS
  Filled 2024-01-07: qty 100

## 2024-01-07 NOTE — Progress Notes (Signed)
 "        Joan Wood                                                                               Joan Wood, is a 53 y.o. female, DOB - 1970-04-05, FMW:[id number] Admit date - 10/03/2023    Outpatient Primary MD for the patient is Bacchus, Meade PEDLAR, FNP  LOS - 96  days    Brief summary   53 y.o. female with a history of decompensated liver cirrhosis, PUD, gastric ulcer perforation status post ex lap, GERD, pancreatitis, obstructive sleep apnea. Patient presented secondary to nausea, coffee-ground emesis, bloody diarrhea with concern for upper GI bleed. 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 Hospitalization complicated by hepatic encephalopathy treated with lactulose  and rifaximin . Patient also requiring recurrent paracenteses for her recurrent ascites.   Assessment & Plan   Decompensated alcoholic cirrhosis with  ascites Paracentesis as needed for symptoms She underwent paracentesis  on 12/26 and 4.8l it fluid removed.    History of hepatic encephalopathy Continue with lactulose .  Patient reports having loose bowel movements.  She is alert and oriented at this time.     Acute metabolic encephalopathy probably secondary to hepatic encephalopathy Transient alteration of awareness No signs of infection-, She is alert and oriented to person and place.    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   Left eye stye No associated erythema or eye pain Continue with erythromycin .    AKI Secondary to ATN from contrast Nephrology consulted and signed off    Appendicitis earlier during the admission General surgery consulted unfortunately not a candidate for surgical management due to high risk.   Patient was recommended conservative management with antibiotics, patient just completed the antibiotic regimen.    Hypotension Resolved.   patient is on midodrine   continue the same  Hypomagnesemia Replaced. Repeat levels in am.    Chronic hyponatremia Sodium of 129 .  Patient's baseline sodium appears to be between 125-128. Continue with weight restriction.   Continue with lasix  40 mg BID.     Acute upper GI bleed Acute esophageal necrosis Acute blood loss anemia S/p a total of 4 units of PRBC transfusion. GI consulted patient underwent endoscopy in September showing evidence of grade D esophagitis in addition for acute esophageal necrosis. Continue with Protonix  twice daily and outpatient GI follow-up.   Lower extremity edema Secondary to hypoalbuminemia from liver disease Slowly improving   Hypokalemia Replaced  Anxiety Continue with hydroxyzine    History of tobacco abuse Refusing nicotine  patch  Severe malnutrition -Dietitian recommendations (11/11): Continue 2 g sodium diet with 1.2 L fluid restriction. Continue Magic Cup berry and choc BID. Each supplement provides 290 Kcals and 9 grams of protein. Continue Ensure+HP TID. Each supplement provides 350 kcal and 20 grams of protein. Continue daily multivitamin PO, 100 mg thiamine  PO daily, and 1 mg folic acid  PO daily. Continue Juven BID for wound healing.   Goals of care Patient has been  seen by palliative care and continues to desire full scope medical care.    Pressure injury Mid sacrum. Unclear if present on admission.     Malnutrition Type:  Nutrition Problem: Severe Malnutrition Etiology: chronic illness (cirrhosis)   Malnutrition Characteristics:  Signs/Symptoms: severe fat depletion, severe muscle depletion   Nutrition Interventions:  Interventions: Ensure Enlive (each supplement provides 350kcal and 20 grams of protein), MVI, Liberalize Diet, Magic cup  Estimated body mass index  is 23.25 kg/m as calculated from the following:   Height as of this encounter: 5' 2 (1.575 m).   Weight as of this encounter: 57.7 kg.  Code Status: Full code DVT Prophylaxis:  SCDs Start: 10/03/23 2153   Level of Care: Level of care: Med-Surg Family Communication: None at bedside  Disposition Plan:     Remains inpatient appropriate: Pending placement.   Procedures:  EGD Paracentesis  Consultants:   Palliative care medicine Gastroenterology Nephrology General surgery  Antimicrobials:   Anti-infectives (From admission, onward)    Start     Dose/Rate Route Frequency Ordered Stop   12/06/23 1930  fluconazole  (DIFLUCAN ) tablet 150 mg        150 mg Oral  Once 12/06/23 1831 12/06/23 1905   11/21/23 1115  sulfamethoxazole -trimethoprim  (BACTRIM  DS) 800-160 MG per tablet 1 tablet        1 tablet Oral Every 12 hours 11/21/23 1024 11/23/23 2117   11/20/23 1400  cefTRIAXone  (ROCEPHIN ) 1 g in sodium chloride  0.9 % 100 mL IVPB  Status:  Discontinued        1 g 200 mL/hr over 30 Minutes Intravenous Every 24 hours 11/20/23 1245 11/21/23 1024   11/12/23 1145  rifaximin  (XIFAXAN ) tablet 550 mg  Status:  Discontinued        550 mg Oral 2 times daily 11/12/23 1058 11/22/23 1628   10/29/23 0000  rifaximin  (XIFAXAN ) 550 MG TABS tablet        550 mg Oral 2 times daily 10/29/23 1129     10/25/23 1415  rifaximin  (XIFAXAN ) tablet 550 mg  Status:  Discontinued        550 mg Oral 2 times daily 10/25/23 1315 11/10/23 1244   10/12/23 1000  metroNIDAZOLE  (FLAGYL ) tablet 500 mg        500 mg Oral Every 12 hours 10/12/23 0734 10/22/23 2125   10/12/23 0830  cefTRIAXone  (ROCEPHIN ) 2 g in sodium chloride  0.9 % 100 mL IVPB        2 g 200 mL/hr over 30 Minutes Intravenous Every 24 hours 10/12/23 0734 10/22/23 1636   10/07/23 1000  fluconazole  (DIFLUCAN ) tablet 100 mg  Status:  Discontinued        100 mg Oral Daily 10/07/23 0853 10/07/23 0855   10/07/23 1000  fluconazole  (DIFLUCAN ) tablet 150 mg         150 mg Oral Daily 10/07/23 0855 10/07/23 1014   10/04/23 1000  cefTRIAXone  (ROCEPHIN ) 2 g in sodium chloride  0.9 % 100 mL IVPB        2 g 200 mL/hr over 30 Minutes Intravenous Every 24 hours 10/03/23 2154 10/08/23 0854   10/03/23 2015  cefTRIAXone  (ROCEPHIN ) 1 g in sodium chloride  0.9 % 100 mL IVPB        1 g 200 mL/hr over 30 Minutes Intravenous  Once 10/03/23 2010 10/03/23 2212        Medications  Scheduled Meds:  sodium chloride    Intravenous Once   calcium  carbonate  1 tablet Oral TID WC  feeding supplement  237 mL Oral TID BM   folic acid   1 mg Oral Daily   furosemide   40 mg Oral BID   gabapentin   600 mg Oral TID   lactulose   30 g Oral TID   lidocaine   2 patch Transdermal Q24H   midodrine   20 mg Oral TID WC   multivitamin with minerals  1 tablet Oral Daily   nutrition supplement (JUVEN)  1 packet Oral BID BM   pantoprazole   40 mg Oral BID   potassium chloride   20 mEq Oral Daily   QUEtiapine   25 mg Oral QHS   thiamine   100 mg Oral Daily   triamcinolone  0.1 % cream : eucerin   Topical Daily   Continuous Infusions:  magnesium  sulfate bolus IVPB 4 g (01/07/24 1405)   PRN Meds:.albuterol , alum & mag hydroxide-simeth, antiseptic oral rinse, artificial tears, benzonatate , HYDROmorphone  (DILAUDID ) injection, hydrOXYzine , melatonin, Muscle Rub, ondansetron , mouth rinse, oxyCODONE , phenol, prochlorperazine , simethicone , witch hazel-glycerin     Subjective:   Joan Wood was seen and examined today.  She reports being slightly anxious requesting medication for anxiety.  Patient reports her pain is better controlled today.  Having bowel movements regularly  Objective:   Vitals:   01/06/24 0743 01/06/24 1516 01/06/24 2331 01/07/24 0802  BP: 121/77 115/62 (!) 111/59 105/62  Pulse: 98 93 92 91  Resp: 15 18 17 18   Temp: 98.6 F (37 C) 98.7 F (37.1 C) 98.4 F (36.9 C) 98.4 F (36.9 C)  TempSrc: Oral Oral Oral Oral  SpO2: 100% 100% 98% 100%  Weight:      Height:         Intake/Output Summary (Last 24 hours) at 01/07/2024 1449 Last data filed at 01/07/2024 1405 Gross per 24 hour  Intake 1440 ml  Output --  Net 1440 ml   Filed Weights   12/28/23 0500 01/04/24 0358 01/05/24 0505  Weight: 62.3 kg 59.6 kg 57.7 kg     Exam General exam: Ill-appearing woman not in any kind of distress Respiratory system: Clear to auscultation. Respiratory effort normal. Cardiovascular system: S1 & S2 heard, RRR.  Gastrointestinal system: Abdomen is soft, distended, mild generalized tenderness present Central nervous system: Alert and oriented.  Memory deficits present Extremities: Pedal edema present Skin: No rashes,  Psychiatry: Anxious but not agitated    Data Reviewed:  I have personally reviewed following labs and imaging studies   CBC Lab Results  Component Value Date   WBC 4.9 01/02/2024   RBC 2.86 (L) 01/02/2024   HGB 8.9 (L) 01/02/2024   HCT 25.7 (L) 01/02/2024   MCV 89.9 01/02/2024   MCH 31.1 01/02/2024   PLT 149 (L) 01/02/2024   MCHC 34.6 01/02/2024   RDW 14.5 01/02/2024   LYMPHSABS 0.9 12/27/2023   MONOABS 1.1 (H) 12/27/2023   EOSABS 0.1 12/27/2023   BASOSABS 0.0 12/27/2023     Last metabolic panel Lab Results  Component Value Date   NA 128 (L) 01/07/2024   K 3.7 01/07/2024   CL 96 (L) 01/07/2024   CO2 25 01/07/2024   BUN 20 01/07/2024   CREATININE 0.56 01/07/2024   GLUCOSE 95 01/07/2024   GFRNONAA >60 01/07/2024   CALCIUM  8.0 (L) 01/07/2024   PHOS 3.3 01/07/2024   PROT 5.9 (L) 01/02/2024   ALBUMIN  2.2 (L) 01/02/2024   LABGLOB 4.4 07/30/2023   AGRATIO 1.2 03/28/2022   BILITOT 0.9 01/02/2024   ALKPHOS 106 01/02/2024   AST 54 (H) 01/02/2024   ALT 25 01/02/2024  ANIONGAP 7 01/07/2024    CBG (last 3)  No results for input(s): GLUCAP in the last 72 hours.    Coagulation Profile: No results for input(s): INR, PROTIME in the last 168 hours.   Radiology Studies: No results found.      Elgie Butter  M.D. Joan Wood 01/07/2024, 2:49 PM  Available via Epic secure chat 7am-7pm After 7 pm, please refer to night coverage provider listed on amion.    "

## 2024-01-07 NOTE — Plan of Care (Signed)
" °  Problem: Clinical Measurements: Goal: Will remain free from infection Outcome: Progressing Goal: Diagnostic test results will improve Outcome: Progressing Goal: Respiratory complications will improve Outcome: Progressing Goal: Cardiovascular complication will be avoided Outcome: Progressing   Problem: Activity: Goal: Risk for activity intolerance will decrease Outcome: Progressing   Problem: Elimination: Goal: Will not experience complications related to bowel motility Outcome: Progressing Goal: Will not experience complications related to urinary retention Outcome: Progressing   Problem: Pain Managment: Goal: General experience of comfort will improve and/or be controlled Outcome: Progressing   Problem: Safety: Goal: Ability to remain free from injury will improve Outcome: Progressing   Problem: Skin Integrity: Goal: Risk for impaired skin integrity will decrease Outcome: Progressing   Problem: Health Behavior/Discharge Planning: Goal: Ability to manage health-related needs will improve Outcome: Not Progressing   Problem: Clinical Measurements: Goal: Ability to maintain clinical measurements within normal limits will improve Outcome: Not Progressing   Problem: Nutrition: Goal: Adequate nutrition will be maintained Outcome: Not Progressing   Problem: Coping: Goal: Level of anxiety will decrease Outcome: Not Progressing   "

## 2024-01-08 NOTE — Progress Notes (Signed)
 "        Joan Wood                                                                               Karishma Unrein, is a 53 y.o. female, DOB - Dec 06, 1970, FMW:[id number] Admit date - 10/03/2023    Outpatient Primary MD for the patient is Bacchus, Meade PEDLAR, FNP  LOS - 97  days    Brief summary   53 y.o. female with a history of decompensated liver cirrhosis, PUD, gastric ulcer perforation status post ex lap, GERD, pancreatitis, obstructive sleep apnea. Patient presented secondary to nausea, coffee-ground emesis, bloody diarrhea with concern for upper GI bleed. 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 Hospitalization complicated by hepatic encephalopathy treated with lactulose  and rifaximin . Patient also requiring recurrent paracenteses for her recurrent ascites. Replacing electrolytes.   Assessment & Plan   Decompensated alcoholic cirrhosis with  ascites Paracentesis as needed for symptoms She underwent paracentesis  on 12/26 and 4.8l it fluid removed.  Repeat US  paracentesis ordered for tomorrow.     History of hepatic encephalopathy Continue with lactulose .  Patient reports having loose bowel movements.  She is alert and oriented at this time.     Acute metabolic encephalopathy probably secondary to hepatic encephalopathy Transient alteration of awareness No signs of infection-, She is alert and oriented to person and place.    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   Left eye stye No associated erythema or eye pain Continue with erythromycin .    AKI Secondary to ATN from contrast Nephrology consulted and signed off    Appendicitis earlier during the admission General surgery consulted  unfortunately not a candidate for surgical management due to high risk.  Patient was recommended conservative management with antibiotics, patient just completed the antibiotic regimen. She denies any abdominal pain today.    Hypotension Resolved.   patient is on midodrine   continue the same  Hypomagnesemia Replaced. Repeat levels in am.    Chronic hyponatremia Sodium of 128.  Patient's baseline sodium appears to be between 125-128. Continue with weight restriction.   Continue with lasix  40 mg BID.     Acute upper GI bleed Acute esophageal necrosis Acute blood loss anemia S/p a total of 4 units of PRBC transfusion. GI consulted patient underwent endoscopy in September showing evidence of grade D esophagitis in addition for acute esophageal necrosis. Continue with Protonix  twice daily and outpatient GI follow-up.   Lower extremity edema Secondary to hypoalbuminemia from liver disease Slowly improving   Hypokalemia Replaced. Repeat levels in am.   Anxiety Continue with hydroxyzine    History of tobacco abuse Refusing nicotine  patch  Severe malnutrition -Dietitian recommendations (11/11): Continue 2 g sodium diet with 1.2 L fluid restriction. Continue Magic Cup berry and choc BID. Each supplement provides 290 Kcals and 9 grams of protein. Continue Ensure+HP TID. Each supplement provides 350 kcal and 20 grams of protein. Continue daily multivitamin PO, 100 mg thiamine  PO daily, and  1 mg folic acid  PO daily. Continue Juven BID for wound healing.   Goals of care Patient has been seen by palliative care and continues to desire full scope medical care.    Pressure injury Mid sacrum. Unclear if present on admission.     Malnutrition Type:  Nutrition Problem: Severe Malnutrition Etiology: chronic illness (cirrhosis)   Malnutrition Characteristics:  Signs/Symptoms: severe fat depletion, severe muscle depletion   Nutrition Interventions:  Interventions:  Ensure Enlive (each supplement provides 350kcal and 20 grams of protein), MVI, Liberalize Diet, Magic cup  Estimated body mass index is 23.25 kg/m as calculated from the following:   Height as of this encounter: 5' 2 (1.575 m).   Weight as of this encounter: 57.7 kg.  Code Status: Full code DVT Prophylaxis:  SCDs Start: 10/03/23 2153   Level of Care: Level of care: Med-Surg Family Communication: None at bedside  Disposition Plan:     Remains inpatient appropriate: Pending placement.   Procedures:  EGD Paracentesis  Consultants:   Palliative care medicine Gastroenterology Nephrology General surgery  Antimicrobials:   Anti-infectives (From admission, onward)    Start     Dose/Rate Route Frequency Ordered Stop   12/06/23 1930  fluconazole  (DIFLUCAN ) tablet 150 mg        150 mg Oral  Once 12/06/23 1831 12/06/23 1905   11/21/23 1115  sulfamethoxazole -trimethoprim  (BACTRIM  DS) 800-160 MG per tablet 1 tablet        1 tablet Oral Every 12 hours 11/21/23 1024 11/23/23 2117   11/20/23 1400  cefTRIAXone  (ROCEPHIN ) 1 g in sodium chloride  0.9 % 100 mL IVPB  Status:  Discontinued        1 g 200 mL/hr over 30 Minutes Intravenous Every 24 hours 11/20/23 1245 11/21/23 1024   11/12/23 1145  rifaximin  (XIFAXAN ) tablet 550 mg  Status:  Discontinued        550 mg Oral 2 times daily 11/12/23 1058 11/22/23 1628   10/29/23 0000  rifaximin  (XIFAXAN ) 550 MG TABS tablet        550 mg Oral 2 times daily 10/29/23 1129     10/25/23 1415  rifaximin  (XIFAXAN ) tablet 550 mg  Status:  Discontinued        550 mg Oral 2 times daily 10/25/23 1315 11/10/23 1244   10/12/23 1000  metroNIDAZOLE  (FLAGYL ) tablet 500 mg        500 mg Oral Every 12 hours 10/12/23 0734 10/22/23 2125   10/12/23 0830  cefTRIAXone  (ROCEPHIN ) 2 g in sodium chloride  0.9 % 100 mL IVPB        2 g 200 mL/hr over 30 Minutes Intravenous Every 24 hours 10/12/23 0734 10/22/23 1636   10/07/23 1000  fluconazole  (DIFLUCAN ) tablet 100 mg   Status:  Discontinued        100 mg Oral Daily 10/07/23 0853 10/07/23 0855   10/07/23 1000  fluconazole  (DIFLUCAN ) tablet 150 mg        150 mg Oral Daily 10/07/23 0855 10/07/23 1014   10/04/23 1000  cefTRIAXone  (ROCEPHIN ) 2 g in sodium chloride  0.9 % 100 mL IVPB        2 g 200 mL/hr over 30 Minutes Intravenous Every 24 hours 10/03/23 2154 10/08/23 0854   10/03/23 2015  cefTRIAXone  (ROCEPHIN ) 1 g in sodium chloride  0.9 % 100 mL IVPB        1 g 200 mL/hr over 30 Minutes Intravenous  Once 10/03/23 2010 10/03/23 2212        Medications  Scheduled Meds:  sodium chloride    Intravenous Once   calcium  carbonate  1 tablet Oral TID WC   feeding supplement  237 mL Oral TID BM   folic acid   1 mg Oral Daily   furosemide   40 mg Oral BID   gabapentin   600 mg Oral TID   lactulose   30 g Oral TID   lidocaine   2 patch Transdermal Q24H   midodrine   20 mg Oral TID WC   multivitamin with minerals  1 tablet Oral Daily   nutrition supplement (JUVEN)  1 packet Oral BID BM   pantoprazole   40 mg Oral BID   potassium chloride   20 mEq Oral Daily   QUEtiapine   25 mg Oral QHS   thiamine   100 mg Oral Daily   triamcinolone  0.1 % cream : eucerin   Topical Daily   Continuous Infusions:   PRN Meds:.albuterol , alum & mag hydroxide-simeth, antiseptic oral rinse, artificial tears, benzonatate , HYDROmorphone  (DILAUDID ) injection, hydrOXYzine , melatonin, Muscle Rub, ondansetron , mouth rinse, oxyCODONE , phenol, prochlorperazine , simethicone , witch hazel-glycerin     Subjective:   Joan Wood was seen and examined today.  No new complaints today. She is cheerful today.   Objective:   Vitals:   01/08/24 0004 01/08/24 0815 01/08/24 1212 01/08/24 1610  BP: 105/62 107/65 107/64 108/65  Pulse: 96 88 85 85  Resp:  16 16 16   Temp: 98 F (36.7 C) 97.6 F (36.4 C) 98.6 F (37 C) 98.4 F (36.9 C)  TempSrc: Oral Oral Oral Oral  SpO2: 100% 100% 100% 99%  Weight:      Height:        Intake/Output Summary  (Last 24 hours) at 01/08/2024 1648 Last data filed at 01/08/2024 1259 Gross per 24 hour  Intake 600 ml  Output --  Net 600 ml   Filed Weights   12/28/23 0500 01/04/24 0358 01/05/24 0505  Weight: 62.3 kg 59.6 kg 57.7 kg     Exam General exam: chronically ill appearing lady not in distress.  Respiratory system: Clear to auscultation. Respiratory effort normal. Cardiovascular system: S1 & S2 heard, RRR. Gastrointestinal system: Abdomen is soft , distended, bs+  Central nervous system: Alert and oriented.  Extremities: trace pedal edema.  Skin: No rashes, Psychiatry: mood is appropriate.    Data Reviewed:  I have personally reviewed following labs and imaging studies   CBC Lab Results  Component Value Date   WBC 4.9 01/02/2024   RBC 2.86 (L) 01/02/2024   HGB 8.9 (L) 01/02/2024   HCT 25.7 (L) 01/02/2024   MCV 89.9 01/02/2024   MCH 31.1 01/02/2024   PLT 149 (L) 01/02/2024   MCHC 34.6 01/02/2024   RDW 14.5 01/02/2024   LYMPHSABS 0.9 12/27/2023   MONOABS 1.1 (H) 12/27/2023   EOSABS 0.1 12/27/2023   BASOSABS 0.0 12/27/2023     Last metabolic panel Lab Results  Component Value Date   NA 128 (L) 01/07/2024   K 3.7 01/07/2024   CL 96 (L) 01/07/2024   CO2 25 01/07/2024   BUN 20 01/07/2024   CREATININE 0.56 01/07/2024   GLUCOSE 95 01/07/2024   GFRNONAA >60 01/07/2024   CALCIUM  8.0 (L) 01/07/2024   PHOS 3.3 01/07/2024   PROT 5.9 (L) 01/02/2024   ALBUMIN  2.2 (L) 01/02/2024   LABGLOB 4.4 07/30/2023   AGRATIO 1.2 03/28/2022   BILITOT 0.9 01/02/2024   ALKPHOS 106 01/02/2024   AST 54 (H) 01/02/2024   ALT 25 01/02/2024   ANIONGAP 7 01/07/2024    CBG (  last 3)  No results for input(s): GLUCAP in the last 72 hours.    Coagulation Profile: No results for input(s): INR, PROTIME in the last 168 hours.   Radiology Studies: No results found.      Elgie Butter M.D. Joan Wood 01/08/2024, 4:48 PM  Available via Epic secure chat 7am-7pm After 7  pm, please refer to night coverage provider listed on amion.    "

## 2024-01-08 NOTE — Plan of Care (Signed)
" °  Problem: Clinical Measurements: Goal: Will remain free from infection Outcome: Progressing Goal: Respiratory complications will improve Outcome: Progressing Goal: Cardiovascular complication will be avoided Outcome: Progressing   Problem: Health Behavior/Discharge Planning: Goal: Ability to manage health-related needs will improve Outcome: Not Progressing   Problem: Pain Managment: Goal: General experience of comfort will improve and/or be controlled Outcome: Not Progressing   Problem: Skin Integrity: Goal: Risk for impaired skin integrity will decrease Outcome: Not Progressing   "

## 2024-01-09 ENCOUNTER — Inpatient Hospital Stay (HOSPITAL_COMMUNITY)

## 2024-01-09 DIAGNOSIS — K7460 Unspecified cirrhosis of liver: Secondary | ICD-10-CM | POA: Diagnosis not present

## 2024-01-09 DIAGNOSIS — K7290 Hepatic failure, unspecified without coma: Secondary | ICD-10-CM | POA: Diagnosis not present

## 2024-01-09 HISTORY — PX: IR PARACENTESIS: IMG2679

## 2024-01-09 LAB — BASIC METABOLIC PANEL WITH GFR
Anion gap: 8 (ref 5–15)
BUN: 16 mg/dL (ref 6–20)
CO2: 26 mmol/L (ref 22–32)
Calcium: 7.8 mg/dL — ABNORMAL LOW (ref 8.9–10.3)
Chloride: 96 mmol/L — ABNORMAL LOW (ref 98–111)
Creatinine, Ser: 0.52 mg/dL (ref 0.44–1.00)
GFR, Estimated: >60 mL/min
Glucose, Bld: 110 mg/dL — ABNORMAL HIGH (ref 70–99)
Potassium: 3 mmol/L — ABNORMAL LOW (ref 3.5–5.1)
Sodium: 130 mmol/L — ABNORMAL LOW (ref 135–145)

## 2024-01-09 LAB — MAGNESIUM: Magnesium: 1.6 mg/dL — ABNORMAL LOW (ref 1.7–2.4)

## 2024-01-09 MED ORDER — POTASSIUM CHLORIDE CRYS ER 20 MEQ PO TBCR
40.0000 meq | EXTENDED_RELEASE_TABLET | Freq: Once | ORAL | Status: AC
  Administered 2024-01-09: 40 meq via ORAL
  Filled 2024-01-09: qty 2

## 2024-01-09 MED ORDER — MAGNESIUM SULFATE 2 GM/50ML IV SOLN
2.0000 g | Freq: Once | INTRAVENOUS | Status: AC
  Administered 2024-01-09: 2 g via INTRAVENOUS
  Filled 2024-01-09: qty 50

## 2024-01-09 MED ORDER — LIDOCAINE-EPINEPHRINE 1 %-1:100000 IJ SOLN
INTRAMUSCULAR | Status: AC
  Filled 2024-01-09: qty 20

## 2024-01-09 MED ORDER — LIDOCAINE-EPINEPHRINE 1 %-1:100000 IJ SOLN
20.0000 mL | Freq: Once | INTRAMUSCULAR | Status: AC
  Administered 2024-01-09: 10 mL via INTRADERMAL

## 2024-01-09 NOTE — Procedures (Signed)
 PROCEDURE SUMMARY:  Successful image-guided paracentesis from the right lower abdomen.  Yielded 3.3 liters of hazy yellow fluid.  No immediate complications.  EBL: trace Patient tolerated well.   Specimen not sent for labs.  Please see imaging section of Epic for full dictation.  Kimble DEL Joeseph Verville PA-C 01/09/2024 9:56 AM

## 2024-01-09 NOTE — Plan of Care (Signed)

## 2024-01-09 NOTE — Progress Notes (Signed)
 Occupational Therapy Treatment Patient Details Name: Joan Wood MRN: [id number] DOB: 07/09/1970 Today's Date: 01/09/2024   History of present illness 53 y.o. female admitted 10/03/2023 with nausea/vomiting and bloody diarrhea. Workup for decompensated alcoholic cirrhosis, ascites, hepatic encephalopathy. S/p upper EGD 9/25 concerning for possible acute esophageal necrosis, portal hypertension, non-bleeding gastric and duodenal ulcers. S/p recurrent paracentesis for recurrent ascites; IR deferred PleurX catheter placement due to social situation. PMH includes decompensated cirrhosis, PUD, GERD, pancreatitis, OSA, peripheral neuropathy, tobacco abuse, alcohol  abuse, severe protein malnutrition.   OT comments  Pt seen for OT treatment this AM. Pt made great progress today, she met all her goals and is functioning at a level that is adequate for d/c from an OT standpoint. Mod I for all UB/LB ADLs and distant supervision with mobility and ambulation via RW. Remains with cog impairments, although improved recall and sequencing today. Remains inattentive (suspect baseline). Continue to recommend 24/7 supervision/assist for IADLs given cognitive deficits, given lack of support would recommend post-acute long-term care. OT to sign-off.      If plan is discharge home, recommend the following:  A little help with bathing/dressing/bathroom;Direct supervision/assist for medications management;Direct supervision/assist for financial management;Assist for transportation;Supervision due to cognitive status   Equipment Recommendations  BSC/3in1    Recommendations for Other Services      Precautions / Restrictions Precautions Precautions: Fall Recall of Precautions/Restrictions: Impaired Precaution/Restrictions Comments: hx of orthostasis Restrictions Weight Bearing Restrictions Per Provider Order: No       Mobility Bed Mobility Overal bed mobility: Modified Independent             General bed  mobility comments: incr time, using bed rails to assist swiveling in/out of bed    Transfers Overall transfer level: Needs assistance Equipment used: Rolling walker (2 wheels) Transfers: Sit to/from Stand Sit to Stand: Supervision           General transfer comment: Stood with fair hand placement from EOB     Balance Overall balance assessment: Mild deficits observed, not formally tested (no LOB observed)                                         ADL either performed or assessed with clinical judgement   ADL Overall ADL's : Modified independent                                       General ADL Comments: pt performed LB dressing via figure four method, grooming, and toileting tasks with indep    Extremity/Trunk Assessment              Vision       Perception     Praxis     Communication Communication Communication: No apparent difficulties   Cognition Arousal: Alert Behavior During Therapy: WFL for tasks assessed/performed Cognition: Cognition impaired             OT - Cognition Comments: pt with poor attention, remains tangential and with erratic thoughts                 Following commands: Impaired Following commands impaired: Follows one step commands with increased time, Follows multi-step commands inconsistently      Cueing   Cueing Techniques: Verbal cues, Visual cues  Exercises  Shoulder Instructions       General Comments pt tolerated well    Pertinent Vitals/ Pain       Pain Assessment Pain Assessment: Faces Faces Pain Scale: Hurts even more Pain Location: buttocks Pain Descriptors / Indicators: Grimacing Pain Intervention(s): Limited activity within patient's tolerance, Monitored during session, Repositioned  Home Living                                          Prior Functioning/Environment              Frequency           Progress Toward Goals  OT  Goals(current goals can now be found in the care plan section)  Progress towards OT goals: Goals met/education completed, patient discharged from OT     Plan      Co-evaluation                 AM-PAC OT 6 Clicks Daily Activity     Outcome Measure   Help from another person eating meals?: None Help from another person taking care of personal grooming?: None Help from another person toileting, which includes using toliet, bedpan, or urinal?: A Little Help from another person bathing (including washing, rinsing, drying)?: A Little Help from another person to put on and taking off regular upper body clothing?: None Help from another person to put on and taking off regular lower body clothing?: None 6 Click Score: 22    End of Session Equipment Utilized During Treatment: Rolling walker (2 wheels)  OT Visit Diagnosis: Other abnormalities of gait and mobility (R26.89);Muscle weakness (generalized) (M62.81);Pain;Other symptoms and signs involving cognitive function   Activity Tolerance Patient tolerated treatment well   Patient Left in bed;with call bell/phone within reach;with bed alarm set   Nurse Communication Mobility status        Time: 1050-1108 OT Time Calculation (min): 18 min  Charges: OT General Charges $OT Visit: 1 Visit OT Treatments $Self Care/Home Management : 8-22 mins  Joan Wood, OTR/L [facility] Acute Rehabilitation Services [phone] Secure Chat Preferred  Joan Wood 01/09/2024, 1:07 PM

## 2024-01-09 NOTE — TOC Progression Note (Signed)
 Transition of Care [facility]) - Progression Note    Patient Details  Name: Joan Wood MRN: [id number] Date of Birth: 01-23-1970  Transition of Care [facility]) CM/SW Contact  [name], [name] Phone Number: 01/09/2024, 2:18 PM  Clinical Narrative:   CSW spoke with [facility], they have availability in [place]. CSW sent referral, but they will not offer a bed for patient with disability pending knowing that she will need LTC. No other bed offers at this time. CSW to follow.    Expected Discharge Plan: Skilled Nursing Facility Barriers to Discharge: Homeless with medical needs, Inadequate or no insurance, Continued Medical Work up, Unsafe home situation               Expected Discharge Plan and Services In-house Referral: Clinical Social Work Discharge Planning Services: CM Consult Post Acute Care Choice: Home Health Living arrangements for the past 2 months: Single Family Home                 DME Arranged: Walker rolling   Date DME Agency Contacted: 10/08/23   Representative spoke with at DME Agency: [name]             Social Drivers of Health (SDOH) Interventions SDOH Screenings   Food Insecurity: No Food Insecurity (10/03/2023)  Housing: Low Risk (10/03/2023)  Transportation Needs: No Transportation Needs (10/03/2023)  Recent Concern: Transportation Needs - Unmet Transportation Needs (08/20/2023)  Utilities: Not At Risk (10/03/2023)  Depression (PHQ2-9): High Risk (07/30/2023)  Social Connections: Socially Isolated (10/03/2023)  Tobacco Use: High Risk (10/04/2023)    Readmission Risk Interventions    10/04/2023    9:02 AM 09/03/2023   12:31 PM 09/02/2023    9:01 AM  Readmission Risk Prevention Plan  Transportation Screening Complete Complete Complete  PCP or Specialist Appt within 3-5 Days   Complete  HRI or Home Care Consult   Complete  Social Work Consult for Recovery Care Planning/Counseling   Complete  Palliative Care Screening   Not  Applicable  Medication Review Oceanographer) Complete Complete Complete  HRI or Home Care Consult Complete Complete   SW Recovery Care/Counseling Consult Complete Complete   Palliative Care Screening Not Applicable Not Applicable   Skilled Nursing Facility Not Applicable Patient Refused

## 2024-01-09 NOTE — Progress Notes (Signed)
 " PROGRESS NOTE    Joan Wood  FMW:[id number] DOB: 18-Jul-1970 DOA: 10/03/2023 PCP: Edman Meade PEDLAR, FNP  Subjective: Patient reports back pain.   Hospital Course: 53 y.o. female with a history of decompensated liver cirrhosis, PUD, gastric ulcer perforation status post ex lap, GERD, pancreatitis, obstructive sleep apnea. Patient presented secondary to nausea, coffee-ground emesis, bloody diarrhea with concern for upper GI bleed. 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 Hospitalization complicated by hepatic encephalopathy treated with lactulose  and rifaximin . Patient also requiring recurrent paracenteses for her recurrent ascites.   Assessment and Plan:  Decompensated alcoholic cirrhosis with  ascites Paracentesis as needed for symptoms She underwent paracentesis  on 12/26 and 4.8 L fluid removed.  Repeat US  paracentesis done on 12/31 with removal of 3.3 L   History of hepatic encephalopathy Continue with lactulose .  Patient reports having loose bowel movements.  She is alert and oriented at this time.    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   Left eye stye No associated erythema or eye pain Continue with erythromycin .   AKI Secondary to ATN from contrast, renal function back to baseline    Appendicitis earlier during the admission General surgery consulted unfortunately not a candidate for surgical management due to high risk.  Patient was recommended conservative management with antibiotics, patient just completed the antibiotic regimen. No further abdominal pain      Hypotension - resolved - continue midodrine     Hypomagnesemia - still low, replaced IV      Chronic hyponatremia  Patient's  baseline sodium appears to be between 125-128, 130 today  Continue with fluid restriction.   Continue with lasix  40 mg BID.    Acute upper GI bleed Acute esophageal necrosis Acute blood loss anemia S/p a total of 4 units of PRBC transfusion. GI consulted patient underwent endoscopy in September showing evidence of grade D esophagitis in addition for acute esophageal necrosis. Continue with Protonix  twice daily and outpatient GI follow-up.    Lower extremity edema Secondary to hypoalbuminemia from liver disease Slowly improving   Hypokalemia - still low, replaced    Anxiety Continue with hydroxyzine    History of tobacco abuse Refusing nicotine  patch   Severe malnutrition -Dietitian recommendations (11/11): Continue 2 g sodium diet with 1.2 L fluid restriction. Continue Magic Cup berry and choc BID. Each supplement provides 290 Kcals and 9 grams of protein. Continue Ensure+HP TID. Each supplement provides 350 kcal and 20 grams of protein. Continue daily multivitamin PO, 100 mg thiamine  PO daily, and 1 mg folic acid  PO daily. Continue Juven BID for wound healing.   Goals of care Patient has been seen by palliative care and continues to desire full scope medical care.    Pressure injury Mid sacrum. Unclear if present on admission.   DVT prophylaxis: SCDs Start: 10/03/23 2153    Code Status: Full Code Disposition Plan: TBD Reason for continuing need for hospitalization: Pending placement   Objective: Vitals:   01/09/24 0839 01/09/24 0858 01/09/24 0915 01/09/24 1512  BP: 108/61 109/60 104/62 113/64  Pulse:   94 90  Resp:   19 17  Temp:   98.4 F (36.9 C) 98 F (36.7 C)  TempSrc:   Oral Oral  SpO2:   99% 99%  Weight:  Height:        Intake/Output Summary (Last 24 hours) at 01/09/2024 1651 Last data filed at 01/08/2024 2250 Gross per 24 hour  Intake 360 ml  Output --  Net 360 ml   Filed Weights   12/28/23 0500 01/04/24 0358 01/05/24 0505  Weight: 62.3  kg 59.6 kg 57.7 kg    Examination:  Physical Exam Vitals and nursing note reviewed.  Constitutional:      General: She is not in acute distress. Cardiovascular:     Rate and Rhythm: Normal rate.  Abdominal:     General: There is distension.     Palpations: Abdomen is soft.  Musculoskeletal:     Right lower leg: Edema present.     Left lower leg: Edema present.     Data Reviewed: I have personally reviewed following labs and imaging studies  CBC: No results for input(s): WBC, NEUTROABS, HGB, HCT, MCV, PLT in the last 168 hours. Basic Metabolic Panel: Recent Labs  Lab 01/03/24 1646 01/05/24 0911 01/07/24 0228 01/09/24 0213  NA 127* 129* 128* 130*  K 4.4 3.3* 3.7 3.0*  CL 97* 95* 96* 96*  CO2 21* 26 25 26   GLUCOSE 98 81 95 110*  BUN 19 17 20 16   CREATININE 0.63 0.53 0.56 0.52  CALCIUM  8.0* 8.2* 8.0* 7.8*  MG  --   --  1.5* 1.6*  PHOS  --   --  3.3  --    GFR: Estimated Creatinine Clearance: 63.6 mL/min (by C-G formula based on SCr of 0.52 mg/dL). Liver Function Tests: No results for input(s): AST, ALT, ALKPHOS, BILITOT, PROT, ALBUMIN  in the last 168 hours. No results for input(s): LIPASE, AMYLASE in the last 168 hours. No results for input(s): AMMONIA in the last 168 hours. Coagulation Profile: No results for input(s): INR, PROTIME in the last 168 hours. Cardiac Enzymes: No results for input(s): CKTOTAL, CKMB, CKMBINDEX, TROPONINI in the last 168 hours. ProBNP, BNP (last 5 results) No results for input(s): PROBNP, BNP in the last 8760 hours. HbA1C: No results for input(s): HGBA1C in the last 72 hours. CBG: No results for input(s): GLUCAP in the last 168 hours. Lipid Profile: No results for input(s): CHOL, HDL, LDLCALC, TRIG, CHOLHDL, LDLDIRECT in the last 72 hours. Thyroid  Function Tests: No results for input(s): TSH, T4TOTAL, FREET4, T3FREE, THYROIDAB in the last 72 hours. Anemia  Panel: No results for input(s): VITAMINB12, FOLATE, FERRITIN, TIBC, IRON, RETICCTPCT in the last 72 hours. Sepsis Labs: No results for input(s): PROCALCITON, LATICACIDVEN in the last 168 hours.  No results found for this or any previous visit (from the past 240 hours).   Radiology Studies: No results found.  Scheduled Meds:  sodium chloride    Intravenous Once   calcium  carbonate  1 tablet Oral TID WC   feeding supplement  237 mL Oral TID BM   folic acid   1 mg Oral Daily   furosemide   40 mg Oral BID   gabapentin   600 mg Oral TID   lactulose   30 g Oral TID   lidocaine   2 patch Transdermal Q24H   midodrine   20 mg Oral TID WC   multivitamin with minerals  1 tablet Oral Daily   nutrition supplement (JUVEN)  1 packet Oral BID BM   pantoprazole   40 mg Oral BID   potassium chloride   20 mEq Oral Daily   QUEtiapine   25 mg Oral QHS   thiamine   100 mg Oral Daily   triamcinolone  0.1 % cream : eucerin  Topical Daily   Continuous Infusions:   LOS: 98 days   Time spent: 37 minutes  Casimer Dare, MD  [facility]  01/09/2024, 4:51 PM   "

## 2024-01-10 DIAGNOSIS — K7460 Unspecified cirrhosis of liver: Secondary | ICD-10-CM | POA: Diagnosis not present

## 2024-01-10 DIAGNOSIS — K7290 Hepatic failure, unspecified without coma: Secondary | ICD-10-CM | POA: Diagnosis not present

## 2024-01-10 MED ORDER — SPIRONOLACTONE 25 MG PO TABS
25.0000 mg | ORAL_TABLET | Freq: Every day | ORAL | Status: DC
  Administered 2024-01-10 – 2024-01-21 (×12): 25 mg via ORAL
  Filled 2024-01-10 (×13): qty 1

## 2024-01-10 NOTE — Plan of Care (Signed)
  Problem: Health Behavior/Discharge Planning: Goal: Ability to manage health-related needs will improve Outcome: Progressing   Problem: Clinical Measurements: Goal: Ability to maintain clinical measurements within normal limits will improve Outcome: Progressing   Problem: Clinical Measurements: Goal: Will remain free from infection Outcome: Progressing   Problem: Clinical Measurements: Goal: Diagnostic test results will improve Outcome: Progressing   

## 2024-01-10 NOTE — Progress Notes (Signed)
 " PROGRESS NOTE    Joan Wood  FMW:[id number] DOB: Apr 23, 1970 DOA: 10/03/2023 PCP: Edman Meade PEDLAR, FNP  Subjective: No acute overnight events, resting comfortably. Continues to have some spasms    Hospital Course: 54 y.o. female with a history of decompensated liver cirrhosis, PUD, gastric ulcer perforation status post ex lap, GERD, pancreatitis, obstructive sleep apnea. Patient presented secondary to nausea, coffee-ground emesis, bloody diarrhea with concern for upper GI bleed. 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 Hospitalization complicated by hepatic encephalopathy treated with lactulose  and rifaximin . Patient also requiring recurrent paracenteses for her recurrent ascites.   Assessment and Plan:  Decompensated alcoholic cirrhosis with  ascites Paracentesis as needed for symptoms She underwent paracentesis  on 12/26 and 12/31 - continue lasix  and added on aldactone     History of hepatic encephalopathy Continue with lactulose .  Patient reports having loose bowel movements.  She is alert and oriented at this time.    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   Left eye stye No associated erythema or eye pain Continue with erythromycin .   AKI Secondary to ATN from contrast, renal function back to baseline    Appendicitis earlier during the admission General surgery consulted unfortunately not a candidate for surgical management due to high risk.  Patient was recommended conservative management with antibiotics, completed the antibiotic regimen. No further abdominal pain    Hypotension - resolved - continue midodrine     Hypomagnesemia - was replaced, check in the am     Chronic hyponatremia   Patient's baseline sodium appears to be between 125-130 Continue with fluid restriction.   Continue with lasix  40 mg BID and added aldactone  today    Acute upper GI bleed Acute esophageal necrosis Acute blood loss anemia S/p a total of 4 units of PRBC transfusion. GI consulted patient underwent endoscopy in September showing evidence of grade D esophagitis in addition for acute esophageal necrosis. Continue with Protonix  twice daily and outpatient GI follow-up.    Lower extremity edema Secondary to hypoalbuminemia from liver disease Slowly improving   Hypokalemia - replaced, check BMP in the am    Anxiety Continue with hydroxyzine    History of tobacco abuse Refusing nicotine  patch   Severe malnutrition -Dietitian recommendations (11/11): Continue with 1.2 L fluid restriction. Continue Magic Cup berry and choc BID. Each supplement provides 290 Kcals and 9 grams of protein. Continue Ensure+HP TID. Each supplement provides 350 kcal and 20 grams of protein. Continue daily multivitamin PO, 100 mg thiamine  PO daily, and 1 mg folic acid  PO daily. Continue Juven BID for wound healing.   Goals of care Patient has been seen by palliative care and continues to desire full scope medical care.    Pressure injury Mid sacrum. Unclear if present on admission.     DVT prophylaxis: SCDs Start: 10/03/23 2153    Code Status: Full Code Disposition Plan: SNF Reason for continuing need for hospitalization: Bed availability  Objective: Vitals:   01/10/24 0452 01/10/24 0743 01/10/24 1143 01/10/24 1531  BP: 101/66 104/69 117/68 125/72  Pulse: 89 99 88 86  Resp: 18 18 18 18   Temp: 99.1 F (37.3 C) (!) 97.5 F (36.4 C) 97.7 F (36.5 C) 98.4 F (36.9 C)  TempSrc: Oral Oral Oral Oral  SpO2: 97% 100%  100% 100%  Weight:      Height:        Intake/Output Summary (Last 24 hours) at 01/10/2024 1557 Last data filed at 01/10/2024 1300 Gross per 24 hour  Intake 840 ml  Output --  Net 840  ml   Filed Weights   12/28/23 0500 01/04/24 0358 01/05/24 0505  Weight: 62.3 kg 59.6 kg 57.7 kg    Examination:  Physical Exam Vitals and nursing note reviewed.  Constitutional:      General: She is not in acute distress. Cardiovascular:     Rate and Rhythm: Normal rate.  Pulmonary:     Effort: No respiratory distress.  Abdominal:     General: There is distension.     Palpations: Abdomen is soft.  Musculoskeletal:     Left lower leg: Edema present.     Data Reviewed: I have personally reviewed following labs and imaging studies  CBC: No results for input(s): WBC, NEUTROABS, HGB, HCT, MCV, PLT in the last 168 hours. Basic Metabolic Panel: Recent Labs  Lab 01/03/24 1646 01/05/24 0911 01/07/24 0228 01/09/24 0213  NA 127* 129* 128* 130*  K 4.4 3.3* 3.7 3.0*  CL 97* 95* 96* 96*  CO2 21* 26 25 26   GLUCOSE 98 81 95 110*  BUN 19 17 20 16   CREATININE 0.63 0.53 0.56 0.52  CALCIUM  8.0* 8.2* 8.0* 7.8*  MG  --   --  1.5* 1.6*  PHOS  --   --  3.3  --    GFR: Estimated Creatinine Clearance: 63.6 mL/min (by C-G formula based on SCr of 0.52 mg/dL). Liver Function Tests: No results for input(s): AST, ALT, ALKPHOS, BILITOT, PROT, ALBUMIN  in the last 168 hours. No results for input(s): LIPASE, AMYLASE in the last 168 hours. No results for input(s): AMMONIA in the last 168 hours. Coagulation Profile: No results for input(s): INR, PROTIME in the last 168 hours. Cardiac Enzymes: No results for input(s): CKTOTAL, CKMB, CKMBINDEX, TROPONINI in the last 168 hours. ProBNP, BNP (last 5 results) No results for input(s): PROBNP, BNP in the last 8760 hours. HbA1C: No results for input(s): HGBA1C in the last 72 hours. CBG: No results for input(s): GLUCAP in the last 168 hours. Lipid Profile: No results for input(s): CHOL, HDL, LDLCALC, TRIG, CHOLHDL, LDLDIRECT in the last 72 hours. Thyroid  Function Tests: No results for  input(s): TSH, T4TOTAL, FREET4, T3FREE, THYROIDAB in the last 72 hours. Anemia Panel: No results for input(s): VITAMINB12, FOLATE, FERRITIN, TIBC, IRON, RETICCTPCT in the last 72 hours. Sepsis Labs: No results for input(s): PROCALCITON, LATICACIDVEN in the last 168 hours.  No results found for this or any previous visit (from the past 240 hours).   Radiology Studies: IR Paracentesis Result Date: 01/09/2024 INDICATION: History of alcoholic cirrhosis with recurrent ascites. Request for therapeutic paracentesis. EXAM: ULTRASOUND GUIDED THERAPEUTIC PARACENTESIS MEDICATIONS: 8 mL 1% lidocaine  COMPLICATIONS: None immediate. PROCEDURE: Informed written consent was obtained from the patient after a discussion of the risks, benefits and alternatives to treatment. A timeout was performed prior to the initiation of the procedure. Initial ultrasound scanning demonstrates a moderate amount of ascites within the right lower abdominal quadrant. The right lower abdomen was prepped and draped in the usual sterile fashion. 1% lidocaine  was used for local anesthesia. Following this, a 19 gauge, 7-cm, OneStep catheter was introduced. An ultrasound image was saved for documentation purposes. The paracentesis was performed. The catheter was removed and a dressing was applied. The patient tolerated the procedure well without immediate  post procedural complication. FINDINGS: A total of approximately 3.3 liters of hazy yellow fluid was removed. IMPRESSION: Successful ultrasound-guided paracentesis yielding 3.3 liters of peritoneal fluid. Performed by: Wyatt Pommier, PA-C Electronically Signed   By: JONETTA Faes M.D.   On: 01/09/2024 16:52    Scheduled Meds:  sodium chloride    Intravenous Once   calcium  carbonate  1 tablet Oral TID WC   feeding supplement  237 mL Oral TID BM   folic acid   1 mg Oral Daily   furosemide   40 mg Oral BID   gabapentin   600 mg Oral TID   lactulose   30 g Oral TID    lidocaine   2 patch Transdermal Q24H   midodrine   20 mg Oral TID WC   multivitamin with minerals  1 tablet Oral Daily   nutrition supplement (JUVEN)  1 packet Oral BID BM   pantoprazole   40 mg Oral BID   potassium chloride   20 mEq Oral Daily   QUEtiapine   25 mg Oral QHS   spironolactone   25 mg Oral Daily   thiamine   100 mg Oral Daily   triamcinolone  0.1 % cream : eucerin   Topical Daily   Continuous Infusions:   LOS: 99 days   Time spent: 37 minutes  Casimer Dare, MD  [facility]  01/10/2024, 3:57 PM   "

## 2024-01-10 NOTE — Plan of Care (Signed)

## 2024-01-11 DIAGNOSIS — K7460 Unspecified cirrhosis of liver: Secondary | ICD-10-CM | POA: Diagnosis not present

## 2024-01-11 DIAGNOSIS — K7290 Hepatic failure, unspecified without coma: Secondary | ICD-10-CM | POA: Diagnosis not present

## 2024-01-11 LAB — MAGNESIUM: Magnesium: 1.6 mg/dL — ABNORMAL LOW (ref 1.7–2.4)

## 2024-01-11 LAB — CBC
HCT: 27.9 % — ABNORMAL LOW (ref 36.0–46.0)
Hemoglobin: 9.3 g/dL — ABNORMAL LOW (ref 12.0–15.0)
MCH: 30.1 pg (ref 26.0–34.0)
MCHC: 33.3 g/dL (ref 30.0–36.0)
MCV: 90.3 fL (ref 80.0–100.0)
Platelets: 172 K/uL (ref 150–400)
RBC: 3.09 MIL/uL — ABNORMAL LOW (ref 3.87–5.11)
RDW: 14.2 % (ref 11.5–15.5)
WBC: 4.3 K/uL (ref 4.0–10.5)
nRBC: 0 % (ref 0.0–0.2)

## 2024-01-11 LAB — BASIC METABOLIC PANEL WITH GFR
Anion gap: 6 (ref 5–15)
BUN: 15 mg/dL (ref 6–20)
CO2: 29 mmol/L (ref 22–32)
Calcium: 8.3 mg/dL — ABNORMAL LOW (ref 8.9–10.3)
Chloride: 98 mmol/L (ref 98–111)
Creatinine, Ser: 0.59 mg/dL (ref 0.44–1.00)
GFR, Estimated: >60 mL/min
Glucose, Bld: 103 mg/dL — ABNORMAL HIGH (ref 70–99)
Potassium: 3.9 mmol/L (ref 3.5–5.1)
Sodium: 132 mmol/L — ABNORMAL LOW (ref 135–145)

## 2024-01-11 MED ORDER — BACLOFEN 10 MG PO TABS
5.0000 mg | ORAL_TABLET | Freq: Three times a day (TID) | ORAL | Status: AC | PRN
  Administered 2024-01-11 – 2024-02-15 (×60): 5 mg via ORAL
  Filled 2024-01-11 (×64): qty 1

## 2024-01-11 MED ORDER — MAGNESIUM SULFATE 2 GM/50ML IV SOLN
2.0000 g | Freq: Once | INTRAVENOUS | Status: AC
  Administered 2024-01-11: 2 g via INTRAVENOUS
  Filled 2024-01-11: qty 50

## 2024-01-11 NOTE — Progress Notes (Signed)
 " PROGRESS NOTE    Joan Wood  FMW:[id number] DOB: 20-Dec-1970 DOA: 10/03/2023 PCP: Edman Meade PEDLAR, FNP  Subjective: Patient reports feeling okay overall,     Hospital Course: 54 y.o. female with a history of decompensated liver cirrhosis, PUD, gastric ulcer perforation status post ex lap, GERD, pancreatitis, obstructive sleep apnea. Patient presented secondary to nausea, coffee-ground emesis, bloody diarrhea with concern for upper GI bleed. 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 Hospitalization complicated by hepatic encephalopathy treated with lactulose  and rifaximin . Patient also requiring recurrent paracenteses for her recurrent ascites.   Assessment and Plan:  Decompensated alcoholic cirrhosis with  ascites Paracentesis as needed for symptoms She underwent paracentesis  on 12/26 and 12/31 - continue lasix  and added on aldactone   - increase aldactone  dose as tolerated   History of hepatic encephalopathy Continue with lactulose .  Patient reports having loose bowel movements.  She is alert and oriented at this time.     Cold sores - improved with acyclovir    Left eye stye No associated erythema or eye pain Continue with erythromycin .   AKI Secondary to ATN from contrast, renal function back to baseline    Appendicitis earlier during the admission General surgery consulted unfortunately not a candidate for surgical management due to high risk.  Patient was recommended conservative management with antibiotics, completed the antibiotic regimen. No further abdominal pain    Hypotension - resolved - continue midodrine     Hypomagnesemia - still low, replaced with IV    Chronic hyponatremia  Patient's baseline sodium appears to be between 125-130 Continue with fluid restriction.   Continue with lasix  40 mg BID and  aldactone  - Na better at 132    Acute upper GI bleed Acute esophageal necrosis Acute blood loss anemia S/p a total of 4 units of PRBC transfusion. GI consulted patient underwent endoscopy in September showing evidence of grade D esophagitis in addition for acute esophageal necrosis. Continue with Protonix  twice daily and outpatient GI follow-up.    Lower extremity edema Secondary to hypoalbuminemia from liver disease Slowly improving   Hypokalemia - resolved   Anxiety Continue with hydroxyzine    History of tobacco abuse Refusing nicotine  patch   Severe malnutrition -Dietitian recommendations (11/11): Continue with 1.2 L fluid restriction. Continue Magic Cup berry and choc BID. Each supplement provides 290 Kcals and 9 grams of protein. Continue Ensure+HP TID. Each supplement provides 350 kcal and 20 grams of protein. Continue daily multivitamin PO, 100 mg thiamine  PO daily, and 1 mg folic acid  PO daily. Continue Juven BID for wound healing.   Goals of care Patient has been seen by palliative care and continues to desire full scope medical care.    Pressure injury Mid sacrum. Unclear if present on admission.     DVT prophylaxis: SCDs Start: 10/03/23 2153    Code Status: Full Code Disposition Plan: TBD Reason for continuing need for hospitalization: No bed offers yet  Objective: Vitals:   01/10/24 2058 01/11/24 0322 01/11/24 0347 01/11/24 0821  BP: 120/77 117/69  115/71  Pulse: 84 83  93  Resp: 18 17    Temp: 98.2 F (36.8 C) 97.8 F (36.6 C)  98.1 F (36.7 C)  TempSrc: Oral Oral  Oral  SpO2: 100% 100%  99%  Weight:   57.7 kg   Height:        Intake/Output Summary (Last 24 hours) at 01/11/2024  1444 Last data filed at 01/11/2024 0702 Gross per 24 hour  Intake 360 ml  Output --  Net 360 ml   Filed Weights   01/04/24 0358 01/05/24 0505 01/11/24 0347  Weight: 59.6 kg 57.7 kg 57.7 kg    Examination:  Physical Exam  Data Reviewed: I have personally  reviewed following labs and imaging studies  CBC: Recent Labs  Lab 01/11/24 0249  WBC 4.3  HGB 9.3*  HCT 27.9*  MCV 90.3  PLT 172   Basic Metabolic Panel: Recent Labs  Lab 01/05/24 0911 01/07/24 0228 01/09/24 0213 01/11/24 0249  NA 129* 128* 130* 132*  K 3.3* 3.7 3.0* 3.9  CL 95* 96* 96* 98  CO2 26 25 26 29   GLUCOSE 81 95 110* 103*  BUN 17 20 16 15   CREATININE 0.53 0.56 0.52 0.59  CALCIUM  8.2* 8.0* 7.8* 8.3*  MG  --  1.5* 1.6* 1.6*  PHOS  --  3.3  --   --    GFR: Estimated Creatinine Clearance: 63.6 mL/min (by C-G formula based on SCr of 0.59 mg/dL). Liver Function Tests: No results for input(s): AST, ALT, ALKPHOS, BILITOT, PROT, ALBUMIN  in the last 168 hours. No results for input(s): LIPASE, AMYLASE in the last 168 hours. No results for input(s): AMMONIA in the last 168 hours. Coagulation Profile: No results for input(s): INR, PROTIME in the last 168 hours. Cardiac Enzymes: No results for input(s): CKTOTAL, CKMB, CKMBINDEX, TROPONINI in the last 168 hours. ProBNP, BNP (last 5 results) No results for input(s): PROBNP, BNP in the last 8760 hours. HbA1C: No results for input(s): HGBA1C in the last 72 hours. CBG: No results for input(s): GLUCAP in the last 168 hours. Lipid Profile: No results for input(s): CHOL, HDL, LDLCALC, TRIG, CHOLHDL, LDLDIRECT in the last 72 hours. Thyroid  Function Tests: No results for input(s): TSH, T4TOTAL, FREET4, T3FREE, THYROIDAB in the last 72 hours. Anemia Panel: No results for input(s): VITAMINB12, FOLATE, FERRITIN, TIBC, IRON, RETICCTPCT in the last 72 hours. Sepsis Labs: No results for input(s): PROCALCITON, LATICACIDVEN in the last 168 hours.  No results found for this or any previous visit (from the past 240 hours).   Radiology Studies: No results found.  Scheduled Meds:  sodium chloride    Intravenous Once   calcium  carbonate  1 tablet Oral TID WC    feeding supplement  237 mL Oral TID BM   folic acid   1 mg Oral Daily   furosemide   40 mg Oral BID   gabapentin   600 mg Oral TID   lactulose   30 g Oral TID   lidocaine   2 patch Transdermal Q24H   midodrine   20 mg Oral TID WC   multivitamin with minerals  1 tablet Oral Daily   nutrition supplement (JUVEN)  1 packet Oral BID BM   pantoprazole   40 mg Oral BID   potassium chloride   20 mEq Oral Daily   QUEtiapine   25 mg Oral QHS   spironolactone   25 mg Oral Daily   thiamine   100 mg Oral Daily   triamcinolone  0.1 % cream : eucerin   Topical Daily   Continuous Infusions:   LOS: 100 days   Time spent: 37 minutes  Casimer Dare, MD  [facility]  01/11/2024, 2:44 PM   "

## 2024-01-12 DIAGNOSIS — K7290 Hepatic failure, unspecified without coma: Secondary | ICD-10-CM | POA: Diagnosis not present

## 2024-01-12 DIAGNOSIS — K7460 Unspecified cirrhosis of liver: Secondary | ICD-10-CM | POA: Diagnosis not present

## 2024-01-12 MED ORDER — HYDROMORPHONE HCL 1 MG/ML IJ SOLN
0.5000 mg | INTRAMUSCULAR | Status: AC | PRN
  Administered 2024-01-12 – 2024-01-13 (×3): 0.5 mg via INTRAVENOUS
  Filled 2024-01-12 (×3): qty 0.5

## 2024-01-12 MED ORDER — MAGNESIUM OXIDE -MG SUPPLEMENT 400 (240 MG) MG PO TABS
400.0000 mg | ORAL_TABLET | Freq: Every day | ORAL | Status: DC
  Administered 2024-01-12 – 2024-01-28 (×17): 400 mg via ORAL
  Filled 2024-01-12 (×18): qty 1

## 2024-01-12 NOTE — Plan of Care (Signed)

## 2024-01-12 NOTE — Plan of Care (Signed)
 ?  Problem: Clinical Measurements: ?Goal: Will remain free from infection ?Outcome: Progressing ?  ?

## 2024-01-12 NOTE — Progress Notes (Signed)
 " PROGRESS NOTE    Joan Wood  FMW:[id number] DOB: 10-Aug-1970 DOA: 10/03/2023 PCP: Edman Meade PEDLAR, FNP  Subjective: Patient reports feeling okay overall, some abdominal discomfort.  Vital signs are stable.    Hospital Course: 54 y.o. female with a history of decompensated liver cirrhosis, PUD, gastric ulcer perforation status post ex lap, GERD, pancreatitis, obstructive sleep apnea. Patient presented secondary to nausea, coffee-ground emesis, bloody diarrhea with concern for upper GI bleed. 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 Hospitalization complicated by hepatic encephalopathy treated with lactulose  and rifaximin . Patient also requiring recurrent paracenteses for her recurrent ascites.   Assessment and Plan:  Decompensated alcoholic cirrhosis with  ascites Paracentesis as needed for symptoms She underwent paracentesis  on 12/26 and 12/31 - continue lasix  and added on aldactone   - increase aldactone  dose as tolerated -Consider paracentesis next week   History of hepatic encephalopathy Continue with lactulose .  Patient reports having loose bowel movements.  She is alert and oriented at this time.     Cold sores - improved with acyclovir    Left eye stye No associated erythema or eye pain Continue with erythromycin .   AKI Secondary to ATN from contrast, renal function back to baseline    Appendicitis earlier during the admission General surgery consulted unfortunately not a candidate for surgical management due to high risk.  Patient was recommended conservative management with antibiotics, completed the antibiotic regimen. No further abdominal pain    Hypotension - resolved - continue midodrine     Hypomagnesemia - still low, replaced with IV.  P.o. magnesium  added   Chronic hyponatremia  Patient's baseline  sodium appears to be between 125-130 Continue with fluid restriction.   Continue with lasix  40 mg BID and aldactone  - Na better at 132    Acute upper GI bleed Acute esophageal necrosis Acute blood loss anemia S/p a total of 4 units of PRBC transfusion. GI consulted patient underwent endoscopy in September showing evidence of grade D esophagitis in addition for acute esophageal necrosis. Continue with Protonix  twice daily and outpatient GI follow-up.    Lower extremity edema Secondary to hypoalbuminemia from liver disease Slowly improving   Hypokalemia - resolved   Anxiety Continue with hydroxyzine    History of tobacco abuse Refusing nicotine  patch   Severe malnutrition -Dietitian recommendations (11/11): Continue with 1.2 L fluid restriction. Continue Magic Cup berry and choc BID. Each supplement provides 290 Kcals and 9 grams of protein. Continue Ensure+HP TID. Each supplement provides 350 kcal and 20 grams of protein. Continue daily multivitamin PO, 100 mg thiamine  PO daily, and 1 mg folic acid  PO daily. Continue Juven BID for wound healing.   Goals of care Patient has been seen by palliative care and continues to desire full scope medical care.    Pressure injury Mid sacrum. Unclear if present on admission.     DVT prophylaxis: SCDs Start: 10/03/23 2153    Code Status: Full Code Disposition Plan: TBD Reason for continuing need for hospitalization: No bed offers yet  Objective: Vitals:   01/11/24 2030 01/12/24 0353 01/12/24 0357 01/12/24 0800  BP: 110/65  122/71 123/71  Pulse: 92  95 94  Resp: 20  18 18   Temp: 99 F (37.2 C)  98.7 F (37.1 C) 97.9 F (36.6 C)  TempSrc: Oral  Oral Oral  SpO2: 96%  100% 99%  Weight:  57.5 kg    Height:  Intake/Output Summary (Last 24 hours) at 01/12/2024 1051 Last data filed at 01/12/2024 0600 Gross per 24 hour  Intake 840 ml  Output --  Net 840 ml   Filed Weights   01/05/24 0505 01/11/24 0347 01/12/24 0353   Weight: 57.7 kg 57.7 kg 57.5 kg    Examination:  Physical Exam Chronically ill looking, not in any acute distress Chest: Clear to auscultation bilaterally, diminished at bases CVs: S1, S2, no murmur, regular rhythm Abdomen: Soft, round distended, mild diffuse tenderness(sensitive to touch) Lower extremity edema present Data Reviewed: I have personally reviewed following labs and imaging studies  CBC: Recent Labs  Lab 01/11/24 0249  WBC 4.3  HGB 9.3*  HCT 27.9*  MCV 90.3  PLT 172   Basic Metabolic Panel: Recent Labs  Lab 01/07/24 0228 01/09/24 0213 01/11/24 0249  NA 128* 130* 132*  K 3.7 3.0* 3.9  CL 96* 96* 98  CO2 25 26 29   GLUCOSE 95 110* 103*  BUN 20 16 15   CREATININE 0.56 0.52 0.59  CALCIUM  8.0* 7.8* 8.3*  MG 1.5* 1.6* 1.6*  PHOS 3.3  --   --    GFR: Estimated Creatinine Clearance: 63.6 mL/min (by C-G formula based on SCr of 0.59 mg/dL). Liver Function Tests: No results for input(s): AST, ALT, ALKPHOS, BILITOT, PROT, ALBUMIN  in the last 168 hours. No results for input(s): LIPASE, AMYLASE in the last 168 hours. No results for input(s): AMMONIA in the last 168 hours. Coagulation Profile: No results for input(s): INR, PROTIME in the last 168 hours. Cardiac Enzymes: No results for input(s): CKTOTAL, CKMB, CKMBINDEX, TROPONINI in the last 168 hours. ProBNP, BNP (last 5 results) No results for input(s): PROBNP, BNP in the last 8760 hours. HbA1C: No results for input(s): HGBA1C in the last 72 hours. CBG: No results for input(s): GLUCAP in the last 168 hours. Lipid Profile: No results for input(s): CHOL, HDL, LDLCALC, TRIG, CHOLHDL, LDLDIRECT in the last 72 hours. Thyroid  Function Tests: No results for input(s): TSH, T4TOTAL, FREET4, T3FREE, THYROIDAB in the last 72 hours. Anemia Panel: No results for input(s): VITAMINB12, FOLATE, FERRITIN, TIBC, IRON, RETICCTPCT in the last 72  hours. Sepsis Labs: No results for input(s): PROCALCITON, LATICACIDVEN in the last 168 hours.  No results found for this or any previous visit (from the past 240 hours).   Radiology Studies: No results found.  Scheduled Meds:  sodium chloride    Intravenous Once   calcium  carbonate  1 tablet Oral TID WC   feeding supplement  237 mL Oral TID BM   folic acid   1 mg Oral Daily   furosemide   40 mg Oral BID   gabapentin   600 mg Oral TID   lactulose   30 g Oral TID   lidocaine   2 patch Transdermal Q24H   magnesium  oxide  400 mg Oral Daily   midodrine   20 mg Oral TID WC   multivitamin with minerals  1 tablet Oral Daily   nutrition supplement (JUVEN)  1 packet Oral BID BM   pantoprazole   40 mg Oral BID   potassium chloride   20 mEq Oral Daily   QUEtiapine   25 mg Oral QHS   spironolactone   25 mg Oral Daily   thiamine   100 mg Oral Daily   triamcinolone  0.1 % cream : eucerin   Topical Daily   Continuous Infusions:   LOS: 101 days    Derryl Duval, MD  [facility]  01/12/2024, 10:51 AM   "

## 2024-01-13 DIAGNOSIS — K7290 Hepatic failure, unspecified without coma: Secondary | ICD-10-CM | POA: Diagnosis not present

## 2024-01-13 DIAGNOSIS — K7460 Unspecified cirrhosis of liver: Secondary | ICD-10-CM | POA: Diagnosis not present

## 2024-01-13 NOTE — Plan of Care (Signed)
  Problem: Health Behavior/Discharge Planning: Goal: Ability to manage health-related needs will improve Outcome: Progressing   Problem: Clinical Measurements: Goal: Diagnostic test results will improve Outcome: Progressing   Problem: Clinical Measurements: Goal: Cardiovascular complication will be avoided Outcome: Progressing   

## 2024-01-13 NOTE — Progress Notes (Signed)
 " PROGRESS NOTE    Joan Wood  FMW:[id number] DOB: Aug 06, 1970 DOA: 10/03/2023 PCP: Edman Meade PEDLAR, FNP  Subjective: Patient reports feeling okay overall, some abdominal discomfort as well as leg pain.  Vital signs are stable.    Hospital Course: 54 y.o. female with a history of decompensated liver cirrhosis, PUD, gastric ulcer perforation status post ex lap, GERD, pancreatitis, obstructive sleep apnea. Patient presented secondary to nausea, coffee-ground emesis, bloody diarrhea with concern for upper GI bleed. 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 Hospitalization complicated by hepatic encephalopathy treated with lactulose  and rifaximin . Patient also requiring recurrent paracenteses for her recurrent ascites.   Assessment and Plan:  Decompensated alcoholic cirrhosis with  ascites Paracentesis as needed for symptoms She underwent paracentesis  on 12/26 and 12/31 - continue lasix  and added on aldactone   - increase aldactone  dose as tolerated -Consider paracentesis next week   History of hepatic encephalopathy Continue with lactulose .  Patient reports having loose bowel movements.  She is alert and oriented at this time.     Cold sores - improved with acyclovir    Left eye stye No associated erythema or eye pain Continue with erythromycin .   AKI Secondary to ATN from contrast, renal function back to baseline    Appendicitis earlier during the admission General surgery consulted unfortunately not a candidate for surgical management due to high risk.  Patient was recommended conservative management with antibiotics, completed the antibiotic regimen. No further abdominal pain    Hypotension - resolved - continue midodrine     Hypomagnesemia - still low, replaced with IV.  P.o. magnesium  added   Chronic hyponatremia   Patient's baseline sodium appears to be between 125-130 Continue with fluid restriction.   Continue with lasix  40 mg BID and aldactone  - Na better at 132    Acute upper GI bleed Acute esophageal necrosis Acute blood loss anemia S/p a total of 4 units of PRBC transfusion. GI consulted patient underwent endoscopy in September showing evidence of grade D esophagitis in addition for acute esophageal necrosis. Continue with Protonix  twice daily and outpatient GI follow-up.    Lower extremity edema Secondary to hypoalbuminemia from liver disease Slowly improving   Hypokalemia - resolved   Anxiety Continue with hydroxyzine    History of tobacco abuse Refusing nicotine  patch   Severe malnutrition -Dietitian recommendations (11/11): Continue with 1.2 L fluid restriction. Continue Magic Cup berry and choc BID. Each supplement provides 290 Kcals and 9 grams of protein. Continue Ensure+HP TID. Each supplement provides 350 kcal and 20 grams of protein. Continue daily multivitamin PO, 100 mg thiamine  PO daily, and 1 mg folic acid  PO daily. Continue Juven BID for wound healing.   Goals of care Patient has been seen by palliative care and continues to desire full scope medical care.    Pressure injury Mid sacrum. Unclear if present on admission.     DVT prophylaxis: SCDs Start: 10/03/23 2153    Code Status: Full Code Disposition Plan: TBD Reason for continuing need for hospitalization: No bed offers yet  Objective: Vitals:   01/13/24 0500 01/13/24 0528 01/13/24 0737 01/13/24 1124  BP:  102/68 120/76 124/70  Pulse:  97 88 90  Resp:  17 18 18   Temp:  98.1 F (36.7 C) 98.5 F (36.9 C) 98.1 F (36.7 C)  TempSrc:  Oral Oral Oral  SpO2:  99% 100% 100%  Weight: 58 kg  Height:        Intake/Output Summary (Last 24 hours) at 01/13/2024 1141 Last data filed at 01/13/2024 0649 Gross per 24 hour  Intake 650 ml  Output --  Net 650 ml   Filed Weights   01/11/24 0347 01/12/24  0353 01/13/24 0500  Weight: 57.7 kg 57.5 kg 58 kg    Examination:  Physical Exam Chronically ill looking, not in any acute distress Chest: Clear to auscultation bilaterally, diminished at bases CVs: S1, S2, no murmur, regular rhythm Abdomen: Soft, round distended, mild diffuse tenderness(sensitive to touch) Lower extremity edema present Data Reviewed: I have personally reviewed following labs and imaging studies  CBC: Recent Labs  Lab 01/11/24 0249  WBC 4.3  HGB 9.3*  HCT 27.9*  MCV 90.3  PLT 172   Basic Metabolic Panel: Recent Labs  Lab 01/07/24 0228 01/09/24 0213 01/11/24 0249  NA 128* 130* 132*  K 3.7 3.0* 3.9  CL 96* 96* 98  CO2 25 26 29   GLUCOSE 95 110* 103*  BUN 20 16 15   CREATININE 0.56 0.52 0.59  CALCIUM  8.0* 7.8* 8.3*  MG 1.5* 1.6* 1.6*  PHOS 3.3  --   --    GFR: Estimated Creatinine Clearance: 63.6 mL/min (by C-G formula based on SCr of 0.59 mg/dL). Liver Function Tests: No results for input(s): AST, ALT, ALKPHOS, BILITOT, PROT, ALBUMIN  in the last 168 hours. No results for input(s): LIPASE, AMYLASE in the last 168 hours. No results for input(s): AMMONIA in the last 168 hours. Coagulation Profile: No results for input(s): INR, PROTIME in the last 168 hours. Cardiac Enzymes: No results for input(s): CKTOTAL, CKMB, CKMBINDEX, TROPONINI in the last 168 hours. ProBNP, BNP (last 5 results) No results for input(s): PROBNP, BNP in the last 8760 hours. HbA1C: No results for input(s): HGBA1C in the last 72 hours. CBG: No results for input(s): GLUCAP in the last 168 hours. Lipid Profile: No results for input(s): CHOL, HDL, LDLCALC, TRIG, CHOLHDL, LDLDIRECT in the last 72 hours. Thyroid  Function Tests: No results for input(s): TSH, T4TOTAL, FREET4, T3FREE, THYROIDAB in the last 72 hours. Anemia Panel: No results for input(s): VITAMINB12, FOLATE, FERRITIN, TIBC, IRON, RETICCTPCT in the  last 72 hours. Sepsis Labs: No results for input(s): PROCALCITON, LATICACIDVEN in the last 168 hours.  No results found for this or any previous visit (from the past 240 hours).   Radiology Studies: No results found.  Scheduled Meds:  sodium chloride    Intravenous Once   calcium  carbonate  1 tablet Oral TID WC   feeding supplement  237 mL Oral TID BM   folic acid   1 mg Oral Daily   furosemide   40 mg Oral BID   gabapentin   600 mg Oral TID   lactulose   30 g Oral TID   lidocaine   2 patch Transdermal Q24H   magnesium  oxide  400 mg Oral Daily   midodrine   20 mg Oral TID WC   multivitamin with minerals  1 tablet Oral Daily   nutrition supplement (JUVEN)  1 packet Oral BID BM   pantoprazole   40 mg Oral BID   potassium chloride   20 mEq Oral Daily   QUEtiapine   25 mg Oral QHS   spironolactone   25 mg Oral Daily   thiamine   100 mg Oral Daily   triamcinolone  0.1 % cream : eucerin   Topical Daily   Continuous Infusions:   LOS: 102 days    Derryl Duval, MD  [facility]  01/13/2024, 11:41 AM   "

## 2024-01-14 DIAGNOSIS — K7290 Hepatic failure, unspecified without coma: Secondary | ICD-10-CM | POA: Diagnosis not present

## 2024-01-14 DIAGNOSIS — K7460 Unspecified cirrhosis of liver: Secondary | ICD-10-CM | POA: Diagnosis not present

## 2024-01-14 LAB — BASIC METABOLIC PANEL WITH GFR
Anion gap: 10 (ref 5–15)
BUN: 15 mg/dL (ref 6–20)
CO2: 25 mmol/L (ref 22–32)
Calcium: 8.3 mg/dL — ABNORMAL LOW (ref 8.9–10.3)
Chloride: 94 mmol/L — ABNORMAL LOW (ref 98–111)
Creatinine, Ser: 0.6 mg/dL (ref 0.44–1.00)
GFR, Estimated: >60 mL/min
Glucose, Bld: 123 mg/dL — ABNORMAL HIGH (ref 70–99)
Potassium: 3.2 mmol/L — ABNORMAL LOW (ref 3.5–5.1)
Sodium: 129 mmol/L — ABNORMAL LOW (ref 135–145)

## 2024-01-14 LAB — MAGNESIUM: Magnesium: 1.7 mg/dL (ref 1.7–2.4)

## 2024-01-14 MED ORDER — POTASSIUM CHLORIDE 20 MEQ PO PACK
40.0000 meq | PACK | Freq: Once | ORAL | Status: AC
  Administered 2024-01-14: 40 meq via ORAL
  Filled 2024-01-14: qty 2

## 2024-01-14 NOTE — Plan of Care (Signed)
  Problem: Clinical Measurements: Goal: Respiratory complications will improve Outcome: Progressing Goal: Cardiovascular complication will be avoided Outcome: Progressing   Problem: Activity: Goal: Risk for activity intolerance will decrease Outcome: Progressing   Problem: Nutrition: Goal: Adequate nutrition will be maintained Outcome: Progressing   Problem: Coping: Goal: Level of anxiety will decrease Outcome: Progressing   Problem: Elimination: Goal: Will not experience complications related to bowel motility Outcome: Progressing Goal: Will not experience complications related to urinary retention Outcome: Progressing   Problem: Skin Integrity: Goal: Risk for impaired skin integrity will decrease Outcome: Progressing

## 2024-01-14 NOTE — Progress Notes (Signed)
 " PROGRESS NOTE    Joan Wood  FMW:[id number] DOB: [birth year] DOA: 10/03/2023 PCP: Edman Meade PEDLAR, FNP  Subjective: Patient reports feeling okay overall, some abdominal discomfort as well as leg pain.  Vital signs are stable.  Labs reviewed today, sodium 129, potassium 3.2.  Calcium  1.7    Hospital Course: 54 y.o. female with a history of decompensated liver cirrhosis, PUD, gastric ulcer perforation status post ex lap, GERD, pancreatitis, obstructive sleep apnea. Patient presented secondary to nausea, coffee-ground emesis, bloody diarrhea with concern for upper GI bleed. 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 Hospitalization complicated by hepatic encephalopathy treated with lactulose  and rifaximin . Patient also requiring recurrent paracenteses for her recurrent ascites.   Assessment and Plan:  Decompensated alcoholic cirrhosis with  ascites Paracentesis as needed for symptoms She underwent paracentesis  on 12/26 and 12/31 - continue lasix  and added on aldactone   - increase aldactone  dose as tolerated -Consider paracentesis next week   History of hepatic encephalopathy Continue with lactulose .  Patient reports having loose bowel movements.  She is alert and oriented at this time.     Cold sores - improved with acyclovir    Left eye stye No associated erythema or eye pain Continue with erythromycin .   AKI Secondary to ATN from contrast, renal function back to baseline    Appendicitis earlier during the admission General surgery consulted unfortunately not a candidate for surgical management due to high risk.  Patient was recommended conservative management with antibiotics, completed the antibiotic regimen. No further abdominal pain    Hypotension - resolved - continue midodrine     Hypokalemia: Potassium is a scheduled  20 meq daily.  Will add 40 meq x 1 today Hypomagnesemia - still low, replaced with IV.  P.o. magnesium  added   Chronic hyponatremia  Patient's baseline sodium appears to be between 125-130 Continue with fluid restriction.   Continue with lasix  40 mg BID and aldactone  - Sodium 129 today   Acute upper GI bleed Acute esophageal necrosis Acute blood loss anemia S/p a total of 4 units of PRBC transfusion. GI consulted patient underwent endoscopy in September showing evidence of grade D esophagitis in addition for acute esophageal necrosis. Continue with Protonix  twice daily and outpatient GI follow-up.    Lower extremity edema Secondary to hypoalbuminemia from liver disease Slowly improving   Hypokalemia - resolved   Anxiety Continue with hydroxyzine    History of tobacco abuse Refusing nicotine  patch   Severe malnutrition -Dietitian recommendations (11/11): Continue with 1.2 L fluid restriction. Continue Magic Cup berry and choc BID. Each supplement provides 290 Kcals and 9 grams of protein. Continue Ensure+HP TID. Each supplement provides 350 kcal and 20 grams of protein. Continue daily multivitamin PO, 100 mg thiamine  PO daily, and 1 mg folic acid  PO daily. Continue Juven BID for wound healing.   Goals of care Patient has been seen by palliative care and continues to desire full scope medical care.    Pressure injury Mid sacrum. Unclear if present on admission.     DVT prophylaxis: SCDs Start: 10/03/23 2153    Code Status: Full Code Disposition Plan: TBD Reason for continuing need for hospitalization: No bed offers yet  Objective: Vitals:   01/13/24 1546 01/13/24 2054 01/14/24 0325 01/14/24 0729  BP: 122/66 112/68 111/67 99/60  Pulse: 95 85 96 91  Resp: 20 18 18 16   Temp: 99.1 F (37.3 C)  99 F (37.2 C) 97.9 F (36.6 C) 98.9 F (37.2 C)  TempSrc: Oral Oral Oral Oral  SpO2: 98% 100% 100% 100%  Weight:      Height:        Intake/Output Summary (Last 24  hours) at 01/14/2024 1330 Last data filed at 01/14/2024 9047 Gross per 24 hour  Intake 1780 ml  Output --  Net 1780 ml   Filed Weights   01/11/24 0347 01/12/24 0353 01/13/24 0500  Weight: 57.7 kg 57.5 kg 58 kg    Examination:  Physical Exam Chronically ill looking, not in any acute distress Chest: Clear to auscultation bilaterally, diminished at bases CVs: S1, S2, no murmur, regular rhythm Abdomen: Soft, round distended, mild diffuse tenderness(sensitive to touch) Lower extremity edema present Data Reviewed: I have personally reviewed following labs and imaging studies  CBC: Recent Labs  Lab 01/11/24 0249  WBC 4.3  HGB 9.3*  HCT 27.9*  MCV 90.3  PLT 172   Basic Metabolic Panel: Recent Labs  Lab 01/09/24 0213 01/11/24 0249 01/14/24 0839  NA 130* 132* 129*  K 3.0* 3.9 3.2*  CL 96* 98 94*  CO2 26 29 25   GLUCOSE 110* 103* 123*  BUN 16 15 15   CREATININE 0.52 0.59 0.60  CALCIUM  7.8* 8.3* 8.3*  MG 1.6* 1.6* 1.7   GFR: Estimated Creatinine Clearance: 63.6 mL/min (by C-G formula based on SCr of 0.6 mg/dL). Liver Function Tests: No results for input(s): AST, ALT, ALKPHOS, BILITOT, PROT, ALBUMIN  in the last 168 hours. No results for input(s): LIPASE, AMYLASE in the last 168 hours. No results for input(s): AMMONIA in the last 168 hours. Coagulation Profile: No results for input(s): INR, PROTIME in the last 168 hours. Cardiac Enzymes: No results for input(s): CKTOTAL, CKMB, CKMBINDEX, TROPONINI in the last 168 hours. ProBNP, BNP (last 5 results) No results for input(s): PROBNP, BNP in the last 8760 hours. HbA1C: No results for input(s): HGBA1C in the last 72 hours. CBG: No results for input(s): GLUCAP in the last 168 hours. Lipid Profile: No results for input(s): CHOL, HDL, LDLCALC, TRIG, CHOLHDL, LDLDIRECT in the last 72 hours. Thyroid  Function Tests: No results for input(s): TSH, T4TOTAL, FREET4, T3FREE,  THYROIDAB in the last 72 hours. Anemia Panel: No results for input(s): VITAMINB12, FOLATE, FERRITIN, TIBC, IRON, RETICCTPCT in the last 72 hours. Sepsis Labs: No results for input(s): PROCALCITON, LATICACIDVEN in the last 168 hours.  No results found for this or any previous visit (from the past 240 hours).   Radiology Studies: No results found.  Scheduled Meds:  sodium chloride    Intravenous Once   calcium  carbonate  1 tablet Oral TID WC   feeding supplement  237 mL Oral TID BM   folic acid   1 mg Oral Daily   furosemide   40 mg Oral BID   gabapentin   600 mg Oral TID   lactulose   30 g Oral TID   lidocaine   2 patch Transdermal Q24H   magnesium  oxide  400 mg Oral Daily   midodrine   20 mg Oral TID WC   multivitamin with minerals  1 tablet Oral Daily   nutrition supplement (JUVEN)  1 packet Oral BID BM   pantoprazole   40 mg Oral BID   potassium chloride   20 mEq Oral Daily   QUEtiapine   25 mg Oral QHS   spironolactone   25 mg Oral Daily   thiamine   100 mg Oral Daily   triamcinolone  0.1 % cream : eucerin   Topical Daily  Continuous Infusions:   LOS: 103 days    Monae Topping, MD  [facility]  01/14/2024, 1:30 PM   "

## 2024-01-15 MED ORDER — HYDROMORPHONE HCL 1 MG/ML IJ SOLN
0.5000 mg | INTRAMUSCULAR | Status: AC
  Administered 2024-01-15: 0.5 mg via INTRAVENOUS
  Filled 2024-01-15: qty 0.5

## 2024-01-15 NOTE — Plan of Care (Signed)
  Problem: Clinical Measurements: Goal: Respiratory complications will improve Outcome: Progressing Goal: Cardiovascular complication will be avoided Outcome: Progressing   Problem: Activity: Goal: Risk for activity intolerance will decrease Outcome: Progressing   Problem: Nutrition: Goal: Adequate nutrition will be maintained Outcome: Progressing   Problem: Coping: Goal: Level of anxiety will decrease Outcome: Progressing   Problem: Elimination: Goal: Will not experience complications related to bowel motility Outcome: Progressing Goal: Will not experience complications related to urinary retention Outcome: Progressing   Problem: Skin Integrity: Goal: Risk for impaired skin integrity will decrease Outcome: Progressing

## 2024-01-15 NOTE — Progress Notes (Signed)
 " PROGRESS NOTE    Joan Wood  FMW:[id number] DOB: 08-13-1970 DOA: 10/03/2023 PCP: Edman Meade PEDLAR, FNP   Subjective: Complains of right forearm neuropathic pain.  Has abdominal pain on a chronic basis.     Hospital Course: 54 y.o. female with a history of decompensated liver cirrhosis, PUD, gastric ulcer perforation status post ex lap, GERD, pancreatitis, obstructive sleep apnea. Patient presented secondary to nausea, coffee-ground emesis, bloody diarrhea with concern for upper GI bleed. 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 Hospitalization complicated by hepatic encephalopathy treated with lactulose  and rifaximin . Patient also requiring recurrent paracenteses for her recurrent ascites.   Assessment and Plan:   Decompensated alcoholic cirrhosis with  ascites Paracentesis as needed for symptoms She underwent paracentesis  on 12/26 and 12/31 - continue lasix  and added on aldactone   - increase aldactone  dose as tolerated -Consider paracentesis next week   History of hepatic encephalopathy Continue with lactulose .  Patient reports having loose bowel movements.  She is alert and oriented at this time.     Cold sores - improved with acyclovir    Left eye stye No associated erythema or eye pain Continue with erythromycin .   AKI Secondary to ATN from contrast, renal function back to baseline    Appendicitis earlier during the admission General surgery consulted unfortunately not a candidate for surgical management due to high risk.  Patient was recommended conservative management with antibiotics, completed the antibiotic regimen. No further abdominal pain    Hypotension -blood pressure somewhat labile. - continue midodrine  already on fairly high doses.  May need to consider Florinef but difficult decision given propensity to  retain fluid.   Hypokalemia: Potassium is a scheduled 20 meq daily.  Will add 40 meq x 1 today Hypomagnesemia - Repeat labs in AM.   Chronic hyponatremia  Patient's baseline sodium appears to be between 125-130 Continue with fluid restriction.   Continue with lasix  40 mg BID and aldactone  - Sodium 129 today -Goal Aldactone  is 100 mg p.o. daily but currently only tolerating 25 mg p.o. daily.   Acute upper GI bleed Acute esophageal necrosis Acute blood loss anemia S/p a total of 4 units of PRBC transfusion. GI consulted patient underwent endoscopy in September showing evidence of grade D esophagitis in addition for acute esophageal necrosis. Continue with Protonix  twice daily and outpatient GI follow-up.    Lower extremity edema Secondary to hypoalbuminemia from liver disease Slowly improving   Hypokalemia - resolved   Anxiety Continue with hydroxyzine    History of tobacco abuse Refusing nicotine  patch   Severe malnutrition -Dietitian recommendations (11/11): Continue with 1.2 L fluid restriction. Continue Magic Cup berry and choc BID. Each supplement provides 290 Kcals and 9 grams of protein. Continue Ensure+HP TID. Each supplement provides 350 kcal and 20 grams of protein. Continue daily multivitamin PO, 100 mg thiamine  PO daily, and 1 mg folic acid  PO daily. Continue Juven BID for wound healing.   Goals of care Patient has been seen by palliative care and continues to desire full scope medical care.    Pressure injury Mid sacrum. Unclear if present on admission.   DVT prophylaxis: SCDs Start: 10/03/23 2153     Code Status: Full Code Family Communication: No family at bedside Disposition Plan: Okay for SNF Reason for continuing need for hospitalization: SNF but no bed offers yet.  Objective: Vitals:   01/14/24 2031 01/14/24 2359 01/15/24  0352 01/15/24 0752  BP: 128/70 (!) 140/85 (!) 102/56 104/71  Pulse: 81 97 93 91  Resp: 17 17 17 18   Temp: 98.6 F (37  C) 98.2 F (36.8 C) 98.6 F (37 C) 99.3 F (37.4 C)  TempSrc: Oral Oral Oral Oral  SpO2: 100% 100% 97% 99%  Weight:      Height:        Intake/Output Summary (Last 24 hours) at 01/15/2024 0903 Last data filed at 01/15/2024 0530 Gross per 24 hour  Intake 1270 ml  Output --  Net 1270 ml   Filed Weights   01/11/24 0347 01/12/24 0353 01/13/24 0500  Weight: 57.7 kg 57.5 kg 58 kg    Examination:  Awake, alert, chronically ill No scleral icterus oropharynx clear Regular rate and rhythm S1-S2 no murmurs rubs gallops  lungs are clear Abdomen is soft no fluid wave Extremities +2 ankle edema    Data Reviewed: I have personally reviewed following labs and imaging studies  CBC: Recent Labs  Lab 01/11/24 0249  WBC 4.3  HGB 9.3*  HCT 27.9*  MCV 90.3  PLT 172   Basic Metabolic Panel: Recent Labs  Lab 01/09/24 0213 01/11/24 0249 01/14/24 0839  NA 130* 132* 129*  K 3.0* 3.9 3.2*  CL 96* 98 94*  CO2 26 29 25   GLUCOSE 110* 103* 123*  BUN 16 15 15   CREATININE 0.52 0.59 0.60  CALCIUM  7.8* 8.3* 8.3*  MG 1.6* 1.6* 1.7   GFR: Estimated Creatinine Clearance: 63.6 mL/min (by C-G formula based on SCr of 0.6 mg/dL). Liver Function Tests: No results for input(s): AST, ALT, ALKPHOS, BILITOT, PROT, ALBUMIN  in the last 168 hours. No results for input(s): LIPASE, AMYLASE in the last 168 hours. No results for input(s): AMMONIA in the last 168 hours. Coagulation Profile: No results for input(s): INR, PROTIME in the last 168 hours. Cardiac Enzymes: No results for input(s): CKTOTAL, CKMB, CKMBINDEX, TROPONINI in the last 168 hours. ProBNP, BNP (last 5 results) No results for input(s): PROBNP, BNP in the last 8760 hours. HbA1C: No results for input(s): HGBA1C in the last 72 hours. CBG: No results for input(s): GLUCAP in the last 168 hours. Lipid Profile: No results for input(s): CHOL, HDL, LDLCALC, TRIG, CHOLHDL, LDLDIRECT in the  last 72 hours. Thyroid  Function Tests: No results for input(s): TSH, T4TOTAL, FREET4, T3FREE, THYROIDAB in the last 72 hours. Anemia Panel: No results for input(s): VITAMINB12, FOLATE, FERRITIN, TIBC, IRON, RETICCTPCT in the last 72 hours. Sepsis Labs: No results for input(s): PROCALCITON, LATICACIDVEN in the last 168 hours.  No results found for this or any previous visit (from the past 240 hours).   Radiology Studies: No results found.  Scheduled Meds:  sodium chloride    Intravenous Once   calcium  carbonate  1 tablet Oral TID WC   feeding supplement  237 mL Oral TID BM   folic acid   1 mg Oral Daily   furosemide   40 mg Oral BID   gabapentin   600 mg Oral TID   lactulose   30 g Oral TID   lidocaine   2 patch Transdermal Q24H   magnesium  oxide  400 mg Oral Daily   midodrine   20 mg Oral TID WC   multivitamin with minerals  1 tablet Oral Daily   nutrition supplement (JUVEN)  1 packet Oral BID BM   pantoprazole   40 mg Oral BID   potassium chloride   20 mEq Oral Daily   QUEtiapine   25 mg Oral QHS   spironolactone   25 mg Oral Daily   thiamine   100 mg Oral Daily   triamcinolone  0.1 % cream : eucerin   Topical Daily   Continuous Infusions:   LOS: 104 days   Time spent: 25 minutes  Joan KANDICE Moose, MD  [facility]  01/15/2024, 9:03 AM   "

## 2024-01-16 ENCOUNTER — Inpatient Hospital Stay (HOSPITAL_COMMUNITY)

## 2024-01-16 HISTORY — PX: IR PARACENTESIS: IMG2679

## 2024-01-16 LAB — COMPREHENSIVE METABOLIC PANEL WITH GFR
ALT: 20 U/L (ref 0–44)
AST: 47 U/L — ABNORMAL HIGH (ref 15–41)
Albumin: 2.2 g/dL — ABNORMAL LOW (ref 3.5–5.0)
Alkaline Phosphatase: 114 U/L (ref 38–126)
Anion gap: 7 (ref 5–15)
BUN: 20 mg/dL (ref 6–20)
CO2: 28 mmol/L (ref 22–32)
Calcium: 8.1 mg/dL — ABNORMAL LOW (ref 8.9–10.3)
Chloride: 97 mmol/L — ABNORMAL LOW (ref 98–111)
Creatinine, Ser: 0.7 mg/dL (ref 0.44–1.00)
GFR, Estimated: >60 mL/min
Glucose, Bld: 101 mg/dL — ABNORMAL HIGH (ref 70–99)
Potassium: 3.9 mmol/L (ref 3.5–5.1)
Sodium: 132 mmol/L — ABNORMAL LOW (ref 135–145)
Total Bilirubin: 0.7 mg/dL (ref 0.0–1.2)
Total Protein: 6.1 g/dL — ABNORMAL LOW (ref 6.5–8.1)

## 2024-01-16 MED ORDER — LIDOCAINE-EPINEPHRINE 1 %-1:100000 IJ SOLN
INTRAMUSCULAR | Status: AC
  Filled 2024-01-16: qty 20

## 2024-01-16 MED ORDER — LIDOCAINE-EPINEPHRINE 1 %-1:100000 IJ SOLN
30.0000 mL | Freq: Once | INTRAMUSCULAR | Status: AC
  Administered 2024-01-16: 10 mL via INTRADERMAL

## 2024-01-16 NOTE — TOC Progression Note (Signed)
 Transition of Care [facility]) - Progression Note    Patient Details  Name: Joan Wood MRN: [id number] Date of Birth: Aug 28, 1970  [facility] CM/SW Contact  [name], [name] Phone Number: 01/16/2024, 3:08 PM  Clinical Narrative:   CSW following for disposition. No bed offers at this time. CSW to follow.    Expected Discharge Plan: Skilled Nursing Facility Barriers to Discharge: Homeless with medical needs, Inadequate or no insurance, Continued Medical Work up, Unsafe home situation               Expected Discharge Plan and Services In-house Referral: Clinical Social Work Discharge Planning Services: CM Consult Post Acute Care Choice: Home Health Living arrangements for the past 2 months: Single Family Home                 DME Arranged: Walker rolling   Date DME Agency Contacted: 10/08/23   Representative spoke with at DME Agency: [name]             Social Drivers of Health (SDOH) Interventions SDOH Screenings   Food Insecurity: No Food Insecurity (10/03/2023)  Housing: Low Risk (10/03/2023)  Transportation Needs: No Transportation Needs (10/03/2023)  Recent Concern: Transportation Needs - Unmet Transportation Needs (08/20/2023)  Utilities: Not At Risk (10/03/2023)  Depression (PHQ2-9): High Risk (07/30/2023)  Social Connections: Socially Isolated (10/03/2023)  Tobacco Use: High Risk (10/04/2023)    Readmission Risk Interventions    10/04/2023    9:02 AM 09/03/2023   12:31 PM 09/02/2023    9:01 AM  Readmission Risk Prevention Plan  Transportation Screening Complete Complete Complete  PCP or Specialist Appt within 3-5 Days   Complete  HRI or Home Care Consult   Complete  Social Work Consult for Recovery Care Planning/Counseling   Complete  Palliative Care Screening   Not Applicable  Medication Review Oceanographer) Complete Complete Complete  HRI or Home Care Consult Complete Complete   SW Recovery Care/Counseling Consult Complete  Complete   Palliative Care Screening Not Applicable Not Applicable   Skilled Nursing Facility Not Applicable Patient Refused

## 2024-01-16 NOTE — Progress Notes (Signed)
 Transfer to IR for paracentesis accompanied by transporter.

## 2024-01-16 NOTE — Progress Notes (Signed)
 " PROGRESS NOTE    CAELEY DOHRMANN  FMW:[id number] DOB: [birth year] DOA: 10/03/2023 PCP: Edman Meade PEDLAR, FNP   Subjective: Patient complains of bilateral hip pain.  This is not a new problem.  Also states she is hoping to have paracentesis today.  Tells me she has been getting it once a week and today is the day to get it.  Denies fever or chills or any other new issues.     Hospital Course: 54 y.o. female with a history of decompensated liver cirrhosis, PUD, gastric ulcer perforation status post ex lap, GERD, pancreatitis, obstructive sleep apnea. Patient presented secondary to nausea, coffee-ground emesis, bloody diarrhea with concern for upper GI bleed. 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 Hospitalization complicated by hepatic encephalopathy treated with lactulose  and rifaximin . Patient also requiring recurrent paracenteses for her recurrent ascites.   Assessment and Plan:   Decompensated alcoholic cirrhosis with  ascites Paracentesis as needed for symptoms She underwent paracentesis  on 12/26 and 12/31 - continue lasix  and added on aldactone   - increase aldactone  dose as tolerated Ultrasound-guided therapeutic paracentesis ordered 01/16/2024   History of hepatic encephalopathy Continue with lactulose .  Patient reports having loose bowel movements.  She is alert and oriented at this time.     Cold sores - improved with acyclovir    Left eye stye No associated erythema or eye pain Continue with erythromycin .   AKI Secondary to ATN from contrast, renal function back to baseline    Appendicitis earlier during the admission General surgery consulted unfortunately not a candidate for surgical management due to high risk.  Patient was recommended conservative management with antibiotics, completed the antibiotic regimen. No  further abdominal pain    Hypotension -blood pressure somewhat labile. - continue midodrine  already on fairly high doses.  May need to consider Florinef but difficult decision given propensity to retain fluid.   Hypokalemia: Potassium is a scheduled 20 meq daily.  Will add 40 meq x 1 today Hypomagnesemia - Potassium is 3.9 today.   Chronic hyponatremia  Patient's baseline sodium appears to be between 125-130 Continue with fluid restriction.   Continue with lasix  40 mg BID and aldactone  - Sodium 132 today -Goal Aldactone  is 100 mg p.o. daily but currently only tolerating 25 mg p.o. daily.   Acute upper GI bleed Acute esophageal necrosis Acute blood loss anemia S/p a total of 4 units of PRBC transfusion. GI consulted patient underwent endoscopy in September showing evidence of grade D esophagitis in addition for acute esophageal necrosis. Continue with Protonix  twice daily and outpatient GI follow-up.    Lower extremity edema Secondary to hypoalbuminemia from liver disease Slowly improving   Hypokalemia - resolved   Anxiety Continue with hydroxyzine    History of tobacco abuse Refusing nicotine  patch   Severe malnutrition -Dietitian recommendations (11/11): Continue with 1.2 L fluid restriction. Continue Magic Cup berry and choc BID. Each supplement provides 290 Kcals and 9 grams of protein. Continue Ensure+HP TID. Each supplement provides 350 kcal and 20 grams of protein. Continue daily multivitamin PO, 100 mg thiamine  PO daily, and 1 mg folic acid  PO daily. Continue Juven BID for wound healing.   Goals of care Patient has been seen by palliative care and continues to desire full scope medical care.    Pressure injury Mid sacrum. Unclear if present on admission.   DVT prophylaxis: SCDs Start: 10/03/23 2153  Code Status: Full Code Family Communication: No family at bedside Disposition Plan: Okay for SNF Reason for continuing need for hospitalization: SNF but no  bed offers yet.  Objective: Vitals:   01/15/24 1552 01/15/24 2023 01/16/24 0211 01/16/24 0810  BP: 107/62 134/68 (!) 91/51 119/73  Pulse: 95 91 93 94  Resp: 16 18 20 20   Temp:  98.1 F (36.7 C) 98.2 F (36.8 C) 97.6 F (36.4 C)  TempSrc:  Oral Oral Oral  SpO2: 99% 99% 96% 100%  Weight:      Height:        Intake/Output Summary (Last 24 hours) at 01/16/2024 0908 Last data filed at 01/16/2024 0716 Gross per 24 hour  Intake 1250 ml  Output --  Net 1250 ml   Filed Weights   01/11/24 0347 01/12/24 0353 01/13/24 0500  Weight: 57.7 kg 57.5 kg 58 kg    Examination:  Awake, alert, chronically ill No scleral icterus oropharynx clear Regular rate and rhythm S1-S2 no murmurs rubs gallops  lungs are clear Abdomen is soft no fluid wave Extremities no edema Neurologic: No asterixis    Data Reviewed: I have personally reviewed following labs and imaging studies  CBC: Recent Labs  Lab 01/11/24 0249  WBC 4.3  HGB 9.3*  HCT 27.9*  MCV 90.3  PLT 172   Basic Metabolic Panel: Recent Labs  Lab 01/11/24 0249 01/14/24 0839 01/16/24 0157  NA 132* 129* 132*  K 3.9 3.2* 3.9  CL 98 94* 97*  CO2 29 25 28   GLUCOSE 103* 123* 101*  BUN 15 15 20   CREATININE 0.59 0.60 0.70  CALCIUM  8.3* 8.3* 8.1*  MG 1.6* 1.7  --    GFR: Estimated Creatinine Clearance: 63.6 mL/min (by C-G formula based on SCr of 0.7 mg/dL). Liver Function Tests: Recent Labs  Lab 01/16/24 0157  AST 47*  ALT 20  ALKPHOS 114  BILITOT 0.7  PROT 6.1*  ALBUMIN  2.2*   No results for input(s): LIPASE, AMYLASE in the last 168 hours. No results for input(s): AMMONIA in the last 168 hours. Coagulation Profile: No results for input(s): INR, PROTIME in the last 168 hours. Cardiac Enzymes: No results for input(s): CKTOTAL, CKMB, CKMBINDEX, TROPONINI in the last 168 hours. ProBNP, BNP (last 5 results) No results for input(s): PROBNP, BNP in the last 8760 hours. HbA1C: No results for  input(s): HGBA1C in the last 72 hours. CBG: No results for input(s): GLUCAP in the last 168 hours. Lipid Profile: No results for input(s): CHOL, HDL, LDLCALC, TRIG, CHOLHDL, LDLDIRECT in the last 72 hours. Thyroid  Function Tests: No results for input(s): TSH, T4TOTAL, FREET4, T3FREE, THYROIDAB in the last 72 hours. Anemia Panel: No results for input(s): VITAMINB12, FOLATE, FERRITIN, TIBC, IRON, RETICCTPCT in the last 72 hours. Sepsis Labs: No results for input(s): PROCALCITON, LATICACIDVEN in the last 168 hours.  No results found for this or any previous visit (from the past 240 hours).   Radiology Studies: No results found.  Scheduled Meds:  sodium chloride    Intravenous Once   calcium  carbonate  1 tablet Oral TID WC   feeding supplement  237 mL Oral TID BM   folic acid   1 mg Oral Daily   furosemide   40 mg Oral BID   gabapentin   600 mg Oral TID   lactulose   30 g Oral TID   lidocaine   2 patch Transdermal Q24H   magnesium  oxide  400 mg Oral Daily   midodrine   20 mg Oral TID WC  multivitamin with minerals  1 tablet Oral Daily   nutrition supplement (JUVEN)  1 packet Oral BID BM   pantoprazole   40 mg Oral BID   potassium chloride   20 mEq Oral Daily   QUEtiapine   25 mg Oral QHS   spironolactone   25 mg Oral Daily   thiamine   100 mg Oral Daily   triamcinolone  0.1 % cream : eucerin   Topical Daily   Continuous Infusions:   LOS: 105 days   Time spent: 25 minutes  Lonni KANDICE Moose, MD  [facility]  01/16/2024, 9:08 AM   "

## 2024-01-16 NOTE — Progress Notes (Signed)
 Received from IR accompanied by transporter.

## 2024-01-16 NOTE — Plan of Care (Signed)
" °  Problem: Health Behavior/Discharge Planning: Goal: Ability to manage health-related needs will improve Outcome: Progressing   Problem: Clinical Measurements: Goal: Ability to maintain clinical measurements within normal limits will improve Outcome: Progressing   Problem: Clinical Measurements: Goal: Will remain free from infection Outcome: Progressing   Problem: Clinical Measurements: Goal: Diagnostic test results will improve Outcome: Progressing   Problem: Activity: Goal: Risk for activity intolerance will decrease Outcome: Progressing   Problem: Nutrition: Goal: Adequate nutrition will be maintained Outcome: Progressing   Problem: Elimination: Goal: Will not experience complications related to bowel motility Outcome: Progressing   Problem: Coping: Goal: Level of anxiety will decrease Outcome: Progressing   Problem: Elimination: Goal: Will not experience complications related to urinary retention Outcome: Progressing   Problem: Safety: Goal: Ability to remain free from injury will improve Outcome: Progressing   Problem: Skin Integrity: Goal: Risk for impaired skin integrity will decrease Outcome: Progressing   Problem: Pain Managment: Goal: General experience of comfort will improve and/or be controlled Outcome: Progressing   "

## 2024-01-16 NOTE — Procedures (Signed)
 PROCEDURE SUMMARY:  Successful image-guided paracentesis from the left lower abdomen.  Yielded 2.4 liters of slightly hazy yellow fluid.  No immediate complications.  EBL: trace Patient tolerated well.   Specimen not sent for labs.  Please see imaging section of Epic for full dictation.  Kimble DEL Luverna Degenhart PA-C 01/16/2024 3:30 PM

## 2024-01-17 LAB — BASIC METABOLIC PANEL WITH GFR
Anion gap: 7 (ref 5–15)
BUN: 16 mg/dL (ref 6–20)
CO2: 28 mmol/L (ref 22–32)
Calcium: 8.4 mg/dL — ABNORMAL LOW (ref 8.9–10.3)
Chloride: 95 mmol/L — ABNORMAL LOW (ref 98–111)
Creatinine, Ser: 0.57 mg/dL (ref 0.44–1.00)
GFR, Estimated: >60 mL/min
Glucose, Bld: 121 mg/dL — ABNORMAL HIGH (ref 70–99)
Potassium: 3.2 mmol/L — ABNORMAL LOW (ref 3.5–5.1)
Sodium: 130 mmol/L — ABNORMAL LOW (ref 135–145)

## 2024-01-17 MED ORDER — DULOXETINE HCL 30 MG PO CPEP: 30.0000 mg | ORAL_CAPSULE | Freq: Every day | ORAL | Status: DC

## 2024-01-17 NOTE — Progress Notes (Signed)
 " PROGRESS NOTE    Joan Wood  FMW:[id number] DOB: 01-19-1970 DOA: 10/03/2023 PCP: Edman Meade PEDLAR, FNP   Subjective: Patient complains of bilateral hip pain.  This is not a new problem.  Says her lidoderm  patches fell off this am. RN is aware.  Asks for more pain meds ( current pain is 1000/10). Discussed that she is not a candidate for cymbalta  due to her liver disease.     Hospital Course: 54 y.o. female with a history of decompensated liver cirrhosis, PUD, gastric ulcer perforation status post ex lap, GERD, pancreatitis, obstructive sleep apnea. Patient presented secondary to nausea, coffee-ground emesis, bloody diarrhea with concern for upper GI bleed. 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 Hospitalization complicated by hepatic encephalopathy treated with lactulose  and rifaximin . Patient also requiring recurrent paracenteses for her recurrent ascites.   Assessment and Plan:   Decompensated alcoholic cirrhosis with  ascites Paracentesis as needed for symptoms She underwent paracentesis  on 12/26 and 12/31 - continue lasix  and added on aldactone   - increase aldactone  dose as tolerated Ultrasound-guided therapeutic paracentesis ordered 01/16/2024   History of hepatic encephalopathy Continue with lactulose .  Patient reports having loose bowel movements.  She is alert and oriented at this time.     Cold sores - improved with acyclovir    Left eye stye No associated erythema or eye pain Continue with erythromycin .   AKI Secondary to ATN from contrast, renal function back to baseline    Appendicitis earlier during the admission General surgery consulted unfortunately not a candidate for surgical management due to high risk.  Patient was recommended conservative management with antibiotics, completed the antibiotic regimen. No  further abdominal pain    Hypotension -blood pressure somewhat labile. - continue midodrine  already on fairly high doses.  May need to consider Florinef but difficult decision given propensity to retain fluid.   Hypokalemia: Potassium is a scheduled 20 meq daily.  Will add 40 meq x 1 today Hypomagnesemia - Potassium is 3.9 today.   Chronic hyponatremia  Patient's baseline sodium appears to be between 125-130 Continue with fluid restriction.   Continue with lasix  40 mg BID and aldactone  - Sodium 132 today -Goal Aldactone  is 100 mg p.o. daily but currently only tolerating 25 mg p.o. daily.   Acute upper GI bleed Acute esophageal necrosis Acute blood loss anemia S/p a total of 4 units of PRBC transfusion. GI consulted patient underwent endoscopy in September showing evidence of grade D esophagitis in addition for acute esophageal necrosis. Continue with Protonix  twice daily and outpatient GI follow-up.    Lower extremity edema Secondary to hypoalbuminemia from liver disease Slowly improving   Hypokalemia - resolved   Anxiety Continue with hydroxyzine    History of tobacco abuse Refusing nicotine  patch  Chronic pain syndrome - cymbalta  relatively contraindicated with her liver disease.   Severe malnutrition -Dietitian recommendations (11/11): Continue with 1.2 L fluid restriction. Continue Magic Cup berry and choc BID. Each supplement provides 290 Kcals and 9 grams of protein. Continue Ensure+HP TID. Each supplement provides 350 kcal and 20 grams of protein. Continue daily multivitamin PO, 100 mg thiamine  PO daily, and 1 mg folic acid  PO daily. Continue Juven BID for wound healing.   Goals of care Patient has been seen by palliative care and continues to desire full scope medical care.    Pressure injury Mid sacrum. Unclear if present on admission.  DVT prophylaxis: SCDs Start: 10/03/23 2153     Code Status: Full Code Family Communication: No family at  bedside Disposition Plan: Okay for SNF Reason for continuing need for hospitalization: SNF but no bed offers yet.  Objective: Vitals:   01/16/24 2002 01/17/24 0258 01/17/24 0500 01/17/24 0833  BP: 114/60 110/63  119/68  Pulse: 88 88  (!) 101  Resp: 17 18  16   Temp: (!) 100.4 F (38 C) (!) 102 F (38.9 C) 98.8 F (37.1 C) 98.6 F (37 C)  TempSrc: Oral   Oral  SpO2: 99% 100%  96%  Weight:      Height:        Intake/Output Summary (Last 24 hours) at 01/17/2024 0833 Last data filed at 01/17/2024 0000 Gross per 24 hour  Intake 1030 ml  Output 2400 ml  Net -1370 ml   Filed Weights   01/11/24 0347 01/12/24 0353 01/13/24 0500  Weight: 57.7 kg 57.5 kg 58 kg    Examination:  Awake, alert, chronically ill, sitting comfortably on the side of the bed No scleral icterus oropharynx clear Regular rate and rhythm S1-S2 no murmurs rubs gallops  lungs are clear Abdomen is sofert no fluid wave Extremities no edema Neurologic: No asterixis    Data Reviewed: I have personally reviewed following labs and imaging studies  CBC: Recent Labs  Lab 01/11/24 0249  WBC 4.3  HGB 9.3*  HCT 27.9*  MCV 90.3  PLT 172   Basic Metabolic Panel: Recent Labs  Lab 01/11/24 0249 01/14/24 0839 01/16/24 0157  NA 132* 129* 132*  K 3.9 3.2* 3.9  CL 98 94* 97*  CO2 29 25 28   GLUCOSE 103* 123* 101*  BUN 15 15 20   CREATININE 0.59 0.60 0.70  CALCIUM  8.3* 8.3* 8.1*  MG 1.6* 1.7  --    GFR: Estimated Creatinine Clearance: 63.6 mL/min (by C-G formula based on SCr of 0.7 mg/dL). Liver Function Tests: Recent Labs  Lab 01/16/24 0157  AST 47*  ALT 20  ALKPHOS 114  BILITOT 0.7  PROT 6.1*  ALBUMIN  2.2*   No results for input(s): LIPASE, AMYLASE in the last 168 hours. No results for input(s): AMMONIA in the last 168 hours. Coagulation Profile: No results for input(s): INR, PROTIME in the last 168 hours. Cardiac Enzymes: No results for input(s): CKTOTAL, CKMB, CKMBINDEX,  TROPONINI in the last 168 hours. ProBNP, BNP (last 5 results) No results for input(s): PROBNP, BNP in the last 8760 hours. HbA1C: No results for input(s): HGBA1C in the last 72 hours. CBG: No results for input(s): GLUCAP in the last 168 hours. Lipid Profile: No results for input(s): CHOL, HDL, LDLCALC, TRIG, CHOLHDL, LDLDIRECT in the last 72 hours. Thyroid  Function Tests: No results for input(s): TSH, T4TOTAL, FREET4, T3FREE, THYROIDAB in the last 72 hours. Anemia Panel: No results for input(s): VITAMINB12, FOLATE, FERRITIN, TIBC, IRON, RETICCTPCT in the last 72 hours. Sepsis Labs: No results for input(s): PROCALCITON, LATICACIDVEN in the last 168 hours.  No results found for this or any previous visit (from the past 240 hours).   Radiology Studies: IR Paracentesis Result Date: 01/16/2024 INDICATION: History of alcoholic cirrhosis with recurrent ascites. Request for therapeutic paracentesis with 5 L max. EXAM: ULTRASOUND GUIDED THERAPEUTIC PARACENTESIS MEDICATIONS: 8 mL 1% lidocaine  COMPLICATIONS: None immediate. PROCEDURE: Informed written consent was obtained from the patient after a discussion of the risks, benefits and alternatives to treatment. A timeout was performed prior to the initiation of the procedure. Initial ultrasound scanning demonstrates a  small amount of ascites within the left lower abdominal quadrant. The left lower abdomen was prepped and draped in the usual sterile fashion. 1% lidocaine  was used for local anesthesia. Following this, a 6 Fr Safe-T-Centesis catheter was introduced. An ultrasound image was saved for documentation purposes. The paracentesis was performed. The catheter was removed and a dressing was applied. The patient tolerated the procedure well without immediate post procedural complication. FINDINGS: A total of approximately 2.4 liters of slightly hazy yellow fluid was removed. IMPRESSION: Successful  ultrasound-guided paracentesis yielding 2.4 liters of peritoneal fluid. Performed by: Wyatt Pommier, PA-C Electronically Signed   By: Cordella Banner   On: 01/16/2024 15:54    Scheduled Meds:  sodium chloride    Intravenous Once   calcium  carbonate  1 tablet Oral TID WC   DULoxetine   30 mg Oral Daily   feeding supplement  237 mL Oral TID BM   folic acid   1 mg Oral Daily   furosemide   40 mg Oral BID   gabapentin   600 mg Oral TID   lactulose   30 g Oral TID   lidocaine   2 patch Transdermal Q24H   magnesium  oxide  400 mg Oral Daily   midodrine   20 mg Oral TID WC   multivitamin with minerals  1 tablet Oral Daily   nutrition supplement (JUVEN)  1 packet Oral BID BM   pantoprazole   40 mg Oral BID   potassium chloride   20 mEq Oral Daily   QUEtiapine   25 mg Oral QHS   spironolactone   25 mg Oral Daily   thiamine   100 mg Oral Daily   triamcinolone  0.1 % cream : eucerin   Topical Daily   Continuous Infusions:   LOS: 106 days   Time spent: 25 minutes  Lonni KANDICE Moose, MD  [facility]  01/17/2024, 8:33 AM   "

## 2024-01-18 NOTE — Plan of Care (Signed)
   Problem: Health Behavior/Discharge Planning: Goal: Ability to manage health-related needs will improve Outcome: Progressing   Problem: Activity: Goal: Risk for activity intolerance will decrease Outcome: Progressing   Problem: Coping: Goal: Level of anxiety will decrease Outcome: Progressing

## 2024-01-18 NOTE — Plan of Care (Signed)
   Problem: Activity: Goal: Risk for activity intolerance will decrease Outcome: Adequate for Discharge

## 2024-01-19 DIAGNOSIS — G894 Chronic pain syndrome: Secondary | ICD-10-CM

## 2024-01-19 NOTE — Plan of Care (Signed)
   Problem: Activity: Goal: Risk for activity intolerance will decrease Outcome: Progressing   Problem: Nutrition: Goal: Adequate nutrition will be maintained Outcome: Progressing   Problem: Coping: Goal: Level of anxiety will decrease Outcome: Progressing   Problem: Elimination: Goal: Will not experience complications related to bowel motility Outcome: Progressing

## 2024-01-19 NOTE — Progress Notes (Signed)
 " PROGRESS NOTE    Joan Wood  FMW:[id number] DOB: 1970/02/19 DOA: 10/03/2023 PCP: Edman Meade PEDLAR, FNP   Subjective: Patient is lying in the bed this morning on rounds.  She states she feels pretty good.  She states she is having bowel movements.  Her abdomen is nontender.  She was having some back pain earlier with which is relieved by getting up and moving around.     Hospital Course: 54 y.o. female with a history of decompensated liver cirrhosis, PUD, gastric ulcer perforation status post ex lap, GERD, pancreatitis, obstructive sleep apnea. Patient presented secondary to nausea, coffee-ground emesis, bloody diarrhea with concern for upper GI bleed. 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 Hospitalization complicated by hepatic encephalopathy treated with lactulose  and rifaximin . Patient also requiring recurrent paracenteses for her recurrent ascites.   Assessment and Plan:   Decompensated alcoholic cirrhosis with  ascites Paracentesis as needed for symptoms She underwent paracentesis  on 12/26 and 12/31 - continue lasix  and added on aldactone   - increase aldactone  dose as tolerated Ultrasound-guided therapeutic paracentesis ordered 01/16/2024   History of hepatic encephalopathy Continue with lactulose .  Patient reports having loose bowel movements.  She is alert and oriented at this time.     Cold sores - improved with acyclovir    Left eye stye No associated erythema or eye pain Continue with erythromycin .   AKI Secondary to ATN from contrast, renal function back to baseline    Appendicitis earlier during the admission General surgery consulted unfortunately not a candidate for surgical management due to high risk.  Patient was recommended conservative management with antibiotics, completed the antibiotic regimen. No  further abdominal pain    Hypotension -blood pressure somewhat labile. - continue midodrine  already on fairly high doses.  May need to consider Florinef but difficult decision given propensity to retain fluid.   Hypokalemia: Potassium is a scheduled 20 meq daily.  Will add 40 meq x 1 today Hypomagnesemia - Potassium is 3.9 today.   Chronic hyponatremia  Patient's baseline sodium appears to be between 125-130 Continue with fluid restriction.   Continue with lasix  40 mg BID and aldactone  - Sodium 132 today -Goal Aldactone  is 100 mg p.o. daily but currently only tolerating 25 mg p.o. daily.   Acute upper GI bleed Acute esophageal necrosis Acute blood loss anemia S/p a total of 4 units of PRBC transfusion. GI consulted patient underwent endoscopy in September showing evidence of grade D esophagitis in addition for acute esophageal necrosis. Continue with Protonix  twice daily and outpatient GI follow-up.    Lower extremity edema Secondary to hypoalbuminemia from liver disease Slowly improving   Hypokalemia - resolved   Anxiety Continue with hydroxyzine    History of tobacco abuse Refusing nicotine  patch  Chronic pain syndrome - cymbalta  relatively contraindicated with her liver disease.   Severe malnutrition -Dietitian recommendations (11/11): Continue with 1.2 L fluid restriction. Continue Magic Cup berry and choc BID. Each supplement provides 290 Kcals and 9 grams of protein. Continue Ensure+HP TID. Each supplement provides 350 kcal and 20 grams of protein. Continue daily multivitamin PO, 100 mg thiamine  PO daily, and 1 mg folic acid  PO daily. Continue Juven BID for wound healing.   Goals of care Patient has been seen by palliative care and continues to desire full scope medical care.    Pressure injury Mid sacrum. Unclear if present on admission.  DVT prophylaxis: SCDs Start: 10/03/23 2153     Code Status: Full Code Family Communication: No family at  bedside Disposition Plan: Okay for SNF Reason for continuing need for hospitalization: SNF but no bed offers yet.  Objective: Vitals:   01/19/24 0450 01/19/24 0712 01/19/24 0750 01/19/24 1521  BP: (!) 112/59  121/71 138/77  Pulse: 95  96 85  Resp: 17  15 15   Temp: 98.1 F (36.7 C)  98.3 F (36.8 C) 98.4 F (36.9 C)  TempSrc: Oral  Oral Oral  SpO2: 98%  98% 100%  Weight:  59.2 kg    Height:        Intake/Output Summary (Last 24 hours) at 01/19/2024 1735 Last data filed at 01/19/2024 1425 Gross per 24 hour  Intake 1076 ml  Output --  Net 1076 ml   Filed Weights   01/13/24 0500 01/18/24 0500 01/19/24 0712  Weight: 58 kg 65 kg 59.2 kg    Examination:  Awake, alert, chronically ill, sitting comfortably on the side of the bed No scleral icterus oropharynx clear Regular rate and rhythm S1-S2 no murmurs rubs gallops  lungs are clear Abdomen is sofert no fluid wave Extremities no edema Neurologic: No asterixis    Data Reviewed: I have personally reviewed following labs and imaging studies  CBC: No results for input(s): WBC, NEUTROABS, HGB, HCT, MCV, PLT in the last 168 hours.  Basic Metabolic Panel: Recent Labs  Lab 01/14/24 0839 01/16/24 0157 01/17/24 1010  NA 129* 132* 130*  K 3.2* 3.9 3.2*  CL 94* 97* 95*  CO2 25 28 28   GLUCOSE 123* 101* 121*  BUN 15 20 16   CREATININE 0.60 0.70 0.57  CALCIUM  8.3* 8.1* 8.4*  MG 1.7  --   --    GFR: Estimated Creatinine Clearance: 63.6 mL/min (by C-G formula based on SCr of 0.57 mg/dL). Liver Function Tests: Recent Labs  Lab 01/16/24 0157  AST 47*  ALT 20  ALKPHOS 114  BILITOT 0.7  PROT 6.1*  ALBUMIN  2.2*   No results for input(s): LIPASE, AMYLASE in the last 168 hours. No results for input(s): AMMONIA in the last 168 hours. Coagulation Profile: No results for input(s): INR, PROTIME in the last 168 hours. Cardiac Enzymes: No results for input(s): CKTOTAL, CKMB, CKMBINDEX, TROPONINI  in the last 168 hours. ProBNP, BNP (last 5 results) No results for input(s): PROBNP, BNP in the last 8760 hours. HbA1C: No results for input(s): HGBA1C in the last 72 hours. CBG: No results for input(s): GLUCAP in the last 168 hours. Lipid Profile: No results for input(s): CHOL, HDL, LDLCALC, TRIG, CHOLHDL, LDLDIRECT in the last 72 hours. Thyroid  Function Tests: No results for input(s): TSH, T4TOTAL, FREET4, T3FREE, THYROIDAB in the last 72 hours. Anemia Panel: No results for input(s): VITAMINB12, FOLATE, FERRITIN, TIBC, IRON, RETICCTPCT in the last 72 hours. Sepsis Labs: No results for input(s): PROCALCITON, LATICACIDVEN in the last 168 hours.  No results found for this or any previous visit (from the past 240 hours).   Radiology Studies: No results found.   Scheduled Meds:  sodium chloride    Intravenous Once   calcium  carbonate  1 tablet Oral TID WC   feeding supplement  237 mL Oral TID BM   folic acid   1 mg Oral Daily   furosemide   40 mg Oral BID   gabapentin   600 mg Oral TID   lactulose   30 g Oral TID   lidocaine   2 patch Transdermal Q24H   magnesium  oxide  400 mg Oral Daily   midodrine   20 mg Oral TID WC   multivitamin with minerals  1 tablet Oral Daily   nutrition supplement (JUVEN)  1 packet Oral BID BM   pantoprazole   40 mg Oral BID   potassium chloride   20 mEq Oral Daily   QUEtiapine   25 mg Oral QHS   spironolactone   25 mg Oral Daily   thiamine   100 mg Oral Daily   triamcinolone  0.1 % cream : eucerin   Topical Daily   Continuous Infusions:   LOS: 108 days   Time spent: 25 minutes  Lonni KANDICE Moose, MD  [facility]  01/19/2024, 5:35 PM   "

## 2024-01-19 NOTE — Progress Notes (Signed)
 " PROGRESS NOTE    Joan Wood  FMW:[id number] DOB: [birth year] DOA: 10/03/2023 PCP: Edman Meade PEDLAR, FNP   Subjective: Is a catch-up note.  Patient seen yesterday.  Patient lying in the bed without complaints.  Her hip pain is resolved.  Her main issue was she wanted the nursing staff to know they did not need to keep such a close eye on her.  She was concerned about the bed alarms going off.  Concerned about nurses watching her clock carefully while she ambulates with her walker.  I would agree with the patient she is quite stable and is in and out of her bed.  I see little utility in  bed alarm for her at this point.  Gets up every day and makes her own bed.     Hospital Course: 54 y.o. female with a history of decompensated liver cirrhosis, PUD, gastric ulcer perforation status post ex lap, GERD, pancreatitis, obstructive sleep apnea. Patient presented secondary to nausea, coffee-ground emesis, bloody diarrhea with concern for upper GI bleed. 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 Hospitalization complicated by hepatic encephalopathy treated with lactulose  and rifaximin . Patient also requiring recurrent paracenteses for her recurrent ascites.   Assessment and Plan:   Decompensated alcoholic cirrhosis with  ascites Paracentesis as needed for symptoms She underwent paracentesis  on 12/26 and 12/31 - continue lasix  and added on aldactone   - increase aldactone  dose as tolerated Ultrasound-guided therapeutic paracentesis ordered 01/16/2024   History of hepatic encephalopathy Continue with lactulose .  Patient reports having loose bowel movements.  She is alert and oriented at this time.     Cold sores - improved with acyclovir    Left eye stye No associated erythema or eye pain Continue with erythromycin .   AKI Secondary to ATN from  contrast, renal function back to baseline    Appendicitis earlier during the admission General surgery consulted unfortunately not a candidate for surgical management due to high risk.  Patient was recommended conservative management with antibiotics, completed the antibiotic regimen. No further abdominal pain    Hypotension -blood pressure somewhat labile. - continue midodrine  already on fairly high doses.  May need to consider Florinef but difficult decision given propensity to retain fluid.   Hypokalemia: Potassium is a scheduled 20 meq daily.  Will add 40 meq x 1 today Hypomagnesemia - Potassium is 3.9 today.   Chronic hyponatremia  Patient's baseline sodium appears to be between 125-130 Continue with fluid restriction.   Continue with lasix  40 mg BID and aldactone  - Sodium 132 today -Goal Aldactone  is 100 mg p.o. daily but currently only tolerating 25 mg p.o. daily.   Acute upper GI bleed Acute esophageal necrosis Acute blood loss anemia S/p a total of 4 units of PRBC transfusion. GI consulted patient underwent endoscopy in September showing evidence of grade D esophagitis in addition for acute esophageal necrosis. Continue with Protonix  twice daily and outpatient GI follow-up.    Lower extremity edema Secondary to hypoalbuminemia from liver disease Slowly improving   Hypokalemia - resolved   Anxiety Continue with hydroxyzine    History of tobacco abuse Refusing nicotine  patch  Chronic pain syndrome - cymbalta  relatively contraindicated with her liver disease.   Severe malnutrition -Dietitian recommendations (11/11): Continue with 1.2 L fluid restriction. Continue Magic Cup berry and choc BID. Each supplement provides 290 Kcals and 9 grams of protein. Continue Ensure+HP TID. Each  supplement provides 350 kcal and 20 grams of protein. Continue daily multivitamin PO, 100 mg thiamine  PO daily, and 1 mg folic acid  PO daily. Continue Juven BID for wound healing.    Goals of care Patient has been seen by palliative care and continues to desire full scope medical care.    Pressure injury Mid sacrum. Unclear if present on admission.     DVT prophylaxis: SCDs Start: 10/03/23 2153     Code Status: Full Code Family Communication: No family at bedside Disposition Plan: Okay for SNF Reason for continuing need for hospitalization: SNF but no bed offers yet.  Objective: Vitals:   01/19/24 0450 01/19/24 0712 01/19/24 0750 01/19/24 1521  BP: (!) 112/59  121/71 138/77  Pulse: 95  96 85  Resp: 17  15 15   Temp: 98.1 F (36.7 C)  98.3 F (36.8 C) 98.4 F (36.9 C)  TempSrc: Oral  Oral Oral  SpO2: 98%  98% 100%  Weight:  59.2 kg    Height:        Intake/Output Summary (Last 24 hours) at 01/19/2024 1731 Last data filed at 01/19/2024 1425 Gross per 24 hour  Intake 1076 ml  Output --  Net 1076 ml   Filed Weights   01/13/24 0500 01/18/24 0500 01/19/24 0712  Weight: 58 kg 65 kg 59.2 kg    Examination:  Awake, alert, chronically ill, sitting comfortably on the side of the bed No scleral icterus oropharynx clear Regular rate and rhythm S1-S2 no murmurs rubs gallops  lungs are clear Abdomen is sofert no fluid wave Extremities no edema Neurologic: No asterixis    Data Reviewed: I have personally reviewed following labs and imaging studies  CBC: No results for input(s): WBC, NEUTROABS, HGB, HCT, MCV, PLT in the last 168 hours.  Basic Metabolic Panel: Recent Labs  Lab 01/14/24 0839 01/16/24 0157 01/17/24 1010  NA 129* 132* 130*  K 3.2* 3.9 3.2*  CL 94* 97* 95*  CO2 25 28 28   GLUCOSE 123* 101* 121*  BUN 15 20 16   CREATININE 0.60 0.70 0.57  CALCIUM  8.3* 8.1* 8.4*  MG 1.7  --   --    GFR: Estimated Creatinine Clearance: 63.6 mL/min (by C-G formula based on SCr of 0.57 mg/dL). Liver Function Tests: Recent Labs  Lab 01/16/24 0157  AST 47*  ALT 20  ALKPHOS 114  BILITOT 0.7  PROT 6.1*  ALBUMIN  2.2*   No results  for input(s): LIPASE, AMYLASE in the last 168 hours. No results for input(s): AMMONIA in the last 168 hours. Coagulation Profile: No results for input(s): INR, PROTIME in the last 168 hours. Cardiac Enzymes: No results for input(s): CKTOTAL, CKMB, CKMBINDEX, TROPONINI in the last 168 hours. ProBNP, BNP (last 5 results) No results for input(s): PROBNP, BNP in the last 8760 hours. HbA1C: No results for input(s): HGBA1C in the last 72 hours. CBG: No results for input(s): GLUCAP in the last 168 hours. Lipid Profile: No results for input(s): CHOL, HDL, LDLCALC, TRIG, CHOLHDL, LDLDIRECT in the last 72 hours. Thyroid  Function Tests: No results for input(s): TSH, T4TOTAL, FREET4, T3FREE, THYROIDAB in the last 72 hours. Anemia Panel: No results for input(s): VITAMINB12, FOLATE, FERRITIN, TIBC, IRON, RETICCTPCT in the last 72 hours. Sepsis Labs: No results for input(s): PROCALCITON, LATICACIDVEN in the last 168 hours.  No results found for this or any previous visit (from the past 240 hours).   Radiology Studies: No results found.   Scheduled Meds:  sodium chloride    Intravenous  Once   calcium  carbonate  1 tablet Oral TID WC   feeding supplement  237 mL Oral TID BM   folic acid   1 mg Oral Daily   furosemide   40 mg Oral BID   gabapentin   600 mg Oral TID   lactulose   30 g Oral TID   lidocaine   2 patch Transdermal Q24H   magnesium  oxide  400 mg Oral Daily   midodrine   20 mg Oral TID WC   multivitamin with minerals  1 tablet Oral Daily   nutrition supplement (JUVEN)  1 packet Oral BID BM   pantoprazole   40 mg Oral BID   potassium chloride   20 mEq Oral Daily   QUEtiapine   25 mg Oral QHS   spironolactone   25 mg Oral Daily   thiamine   100 mg Oral Daily   triamcinolone  0.1 % cream : eucerin   Topical Daily   Continuous Infusions:   LOS: 108 days   Time spent: 25 minutes  Lonni KANDICE Moose, MD  [facility]  01/19/2024, 5:31 PM   "

## 2024-01-19 NOTE — Progress Notes (Signed)
 Non-compliant with fluid restrictions. Calls non-stop for cups of ice for her bottles of Dr Nunzio she has in her room. Will call for another cup of ice within 5 minutes of receiving a cup of ice. Gets angry and exclaims that this is stupid if we try to limit her due to her fluid restrictions.

## 2024-01-20 LAB — CBC WITH DIFFERENTIAL/PLATELET
Basophils Absolute: 0 K/uL (ref 0.0–0.1)
Basophils Relative: 0 %
Eosinophils Absolute: 0 K/uL (ref 0.0–0.5)
Eosinophils Relative: 1 %
HCT: 25.6 % — ABNORMAL LOW (ref 36.0–46.0)
Hemoglobin: 8.7 g/dL — ABNORMAL LOW (ref 12.0–15.0)
Lymphocytes Relative: 20 %
Lymphs Abs: 0.8 K/uL (ref 0.7–4.0)
MCH: 29.6 pg (ref 26.0–34.0)
MCHC: 34 g/dL (ref 30.0–36.0)
MCV: 87.1 fL (ref 80.0–100.0)
Monocytes Absolute: 0.3 K/uL (ref 0.1–1.0)
Monocytes Relative: 8 %
Neutro Abs: 2.9 K/uL (ref 1.7–7.7)
Neutrophils Relative %: 71 %
Platelets: 154 K/uL (ref 150–400)
RBC: 2.94 MIL/uL — ABNORMAL LOW (ref 3.87–5.11)
RDW: 14.2 % (ref 11.5–15.5)
WBC: 4.1 K/uL (ref 4.0–10.5)
nRBC: 0 % (ref 0.0–0.2)

## 2024-01-20 LAB — COMPREHENSIVE METABOLIC PANEL WITH GFR
ALT: 22 U/L (ref 0–44)
AST: 52 U/L — ABNORMAL HIGH (ref 15–41)
Albumin: 2.3 g/dL — ABNORMAL LOW (ref 3.5–5.0)
Alkaline Phosphatase: 116 U/L (ref 38–126)
Anion gap: 11 (ref 5–15)
BUN: 16 mg/dL (ref 6–20)
CO2: 25 mmol/L (ref 22–32)
Calcium: 8.1 mg/dL — ABNORMAL LOW (ref 8.9–10.3)
Chloride: 97 mmol/L — ABNORMAL LOW (ref 98–111)
Creatinine, Ser: 0.61 mg/dL (ref 0.44–1.00)
GFR, Estimated: >60 mL/min
Glucose, Bld: 77 mg/dL (ref 70–99)
Potassium: 2.9 mmol/L — ABNORMAL LOW (ref 3.5–5.1)
Sodium: 133 mmol/L — ABNORMAL LOW (ref 135–145)
Total Bilirubin: 0.7 mg/dL (ref 0.0–1.2)
Total Protein: 6.5 g/dL (ref 6.5–8.1)

## 2024-01-20 MED ORDER — POTASSIUM CHLORIDE 20 MEQ PO PACK
40.0000 meq | PACK | Freq: Once | ORAL | Status: AC
  Administered 2024-01-20: 40 meq via ORAL
  Filled 2024-01-20: qty 2

## 2024-01-20 NOTE — Progress Notes (Signed)
 " PROGRESS NOTE    Joan Wood  FMW:[id number] DOB: 1970-01-30 DOA: 10/03/2023 PCP: Edman Meade PEDLAR, FNP   Subjective: Patient is lying in the bed this morning on rounds.  She states she feels pretty good.  She states she is having bowel movements.  Her abdomen is nontender.  She was having some back pain earlier with which is relieved by getting up and moving around.     Hospital Course: 54 y.o. female with a history of decompensated liver cirrhosis, PUD, gastric ulcer perforation status post ex lap, GERD, pancreatitis, obstructive sleep apnea. Patient presented secondary to nausea, coffee-ground emesis, bloody diarrhea with concern for upper GI bleed. 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 Hospitalization complicated by hepatic encephalopathy treated with lactulose  and rifaximin . Patient also requiring recurrent paracenteses for her recurrent ascites.  Interval history: No acute events overnight, patient seen and evaluated.  Abdomen distended.complaining of pain.   Assessment and Plan:   #Decompensated alcoholic cirrhosis with  ascites #History of hepatic encephalopathy Paracentesis as needed for symptoms, Next one scheduled for 01/23/2024. - Continue lasix  and added on aldactone   -Order for ultrasound-guided paracentesis placed - Continue with lactulose ; titrate to 2-3 BMs per day.  She is alert and oriented at this time.     #Cold sores - improved with acyclovir    #Left eye stye No associated erythema or eye pain - Continue with erythromycin .   #AKI Secondary to ATN from contrast, renal function back to baseline    #Appendicitis  Earlier during the admission General surgery consulted unfortunately not a candidate for surgical management due to high risk.  Patient was recommended conservative management with antibiotics,  completed the antibiotic regimen. No further abdominal pain    #Hypotension blood pressure somewhat labile. - continue midodrine  already on fairly high doses.  May need to consider Florinef but difficult decision given propensity to retain fluid.   #Hypokalemia #Hypomagnesemia - Replete as needed   #Chronic hyponatremia  Patient's baseline sodium appears to be between 125-130 Continue with fluid restriction.   Continue with lasix  40 mg BID and aldactone  Goal Aldactone  is 100 mg p.o. daily but currently only tolerating 25 mg p.o. daily.   #Acute upper GI bleed #Acute esophageal necrosis #Acute blood loss anemia S/p a total of 4 units of PRBC transfusion. GI consulted patient underwent endoscopy in September showing evidence of grade D esophagitis in addition for acute esophageal necrosis. - Continue with Protonix  twice daily and outpatient GI follow-up.    #Lower extremity edema Secondary to hypoalbuminemia from liver disease Slowly improving  #Anxiety Continue with hydroxyzine    #History of tobacco abuse Refusing nicotine  patch  #Chronic pain syndrome - cymbalta  relatively contraindicated with her liver disease.   #Severe malnutrition Dietitian recommendations (11/11): - Continue with 1.2 L fluid restriction. - Continue Magic Cup berry and choc BID. Each supplement provides 290 Kcals and 9 grams of protein. - Continue Ensure+HP TID. Each supplement provides 350 kcal and 20 grams of protein. - Continue daily multivitamin PO, 100 mg thiamine  PO daily, and 1 mg folic acid  PO daily. - Continue Juven BID for wound healing.  #Pressure injury Mid sacrum. Unclear if present on admission.   Goals of care Patient has been seen by palliative care and continues to desire full scope medical care.     DVT prophylaxis: SCDs Start: 10/03/23 2153     Code Status: Full  Code Family Communication: No family at bedside Disposition Plan: Okay for SNF Reason for continuing need for  hospitalization: SNF but no bed offers yet.  Objective: Vitals:   01/19/24 0750 01/19/24 1521 01/19/24 2123 01/20/24 0713  BP: 121/71 138/77 (!) 140/87 128/74  Pulse: 96 85 86 85  Resp: 15 15 20 17   Temp: 98.3 F (36.8 C) 98.4 F (36.9 C) 97.9 F (36.6 C) (!) 97.3 F (36.3 C)  TempSrc: Oral Oral Oral Oral  SpO2: 98% 100% 100% 100%  Weight:      Height:        Intake/Output Summary (Last 24 hours) at 01/20/2024 0909 Last data filed at 01/20/2024 0900 Gross per 24 hour  Intake 836 ml  Output --  Net 836 ml   Filed Weights   01/13/24 0500 01/18/24 0500 01/19/24 0712  Weight: 58 kg 65 kg 59.2 kg    Examination:  Awake, alert, chronically ill, sitting comfortably on the side of the bed No scleral icterus oropharynx clear Regular rate and rhythm S1-S2 no murmurs rubs gallops  lungs are clear Abdomen somewhat distended no fluid wave Extremities no edema Neurologic: No asterixis    Data Reviewed: I have personally reviewed following labs and imaging studies  CBC: Recent Labs  Lab 01/20/24 0152  WBC 4.1  NEUTROABS 2.9  HGB 8.7*  HCT 25.6*  MCV 87.1  PLT 154    Basic Metabolic Panel: Recent Labs  Lab 01/14/24 0839 01/16/24 0157 01/17/24 1010 01/20/24 0152  NA 129* 132* 130* 133*  K 3.2* 3.9 3.2* 2.9*  CL 94* 97* 95* 97*  CO2 25 28 28 25   GLUCOSE 123* 101* 121* 77  BUN 15 20 16 16   CREATININE 0.60 0.70 0.57 0.61  CALCIUM  8.3* 8.1* 8.4* 8.1*  MG 1.7  --   --   --    GFR: Estimated Creatinine Clearance: 63.6 mL/min (by C-G formula based on SCr of 0.61 mg/dL). Liver Function Tests: Recent Labs  Lab 01/16/24 0157 01/20/24 0152  AST 47* 52*  ALT 20 22  ALKPHOS 114 116  BILITOT 0.7 0.7  PROT 6.1* 6.5  ALBUMIN  2.2* 2.3*   No results for input(s): LIPASE, AMYLASE in the last 168 hours. No results for input(s): AMMONIA in the last 168 hours. Coagulation Profile: No results for input(s): INR, PROTIME in the last 168 hours. Cardiac  Enzymes: No results for input(s): CKTOTAL, CKMB, CKMBINDEX, TROPONINI in the last 168 hours. ProBNP, BNP (last 5 results) No results for input(s): PROBNP, BNP in the last 8760 hours. HbA1C: No results for input(s): HGBA1C in the last 72 hours. CBG: No results for input(s): GLUCAP in the last 168 hours. Lipid Profile: No results for input(s): CHOL, HDL, LDLCALC, TRIG, CHOLHDL, LDLDIRECT in the last 72 hours. Thyroid  Function Tests: No results for input(s): TSH, T4TOTAL, FREET4, T3FREE, THYROIDAB in the last 72 hours. Anemia Panel: No results for input(s): VITAMINB12, FOLATE, FERRITIN, TIBC, IRON, RETICCTPCT in the last 72 hours. Sepsis Labs: No results for input(s): PROCALCITON, LATICACIDVEN in the last 168 hours.  No results found for this or any previous visit (from the past 240 hours).   Radiology Studies: No results found.   Scheduled Meds:  sodium chloride    Intravenous Once   calcium  carbonate  1 tablet Oral TID WC   feeding supplement  237 mL Oral TID BM   folic acid   1 mg Oral Daily   furosemide   40 mg Oral BID   gabapentin   600 mg Oral  TID   lactulose   30 g Oral TID   lidocaine   2 patch Transdermal Q24H   magnesium  oxide  400 mg Oral Daily   midodrine   20 mg Oral TID WC   multivitamin with minerals  1 tablet Oral Daily   nutrition supplement (JUVEN)  1 packet Oral BID BM   pantoprazole   40 mg Oral BID   potassium chloride   20 mEq Oral Daily   QUEtiapine   25 mg Oral QHS   spironolactone   25 mg Oral Daily   thiamine   100 mg Oral Daily   triamcinolone  0.1 % cream : eucerin   Topical Daily   Continuous Infusions:   LOS: 109 days   Time spent: 25 minutes  Steffany Schoenfelder F Tyashia Morrisette, DO  [facility]  01/20/2024, 9:09 AM   "

## 2024-01-21 ENCOUNTER — Inpatient Hospital Stay (HOSPITAL_COMMUNITY)

## 2024-01-21 DIAGNOSIS — K7290 Hepatic failure, unspecified without coma: Secondary | ICD-10-CM | POA: Diagnosis not present

## 2024-01-21 DIAGNOSIS — K7460 Unspecified cirrhosis of liver: Secondary | ICD-10-CM | POA: Diagnosis not present

## 2024-01-21 MED ORDER — LIDOCAINE-EPINEPHRINE 1 %-1:100000 IJ SOLN
INTRAMUSCULAR | Status: AC
  Filled 2024-01-21: qty 20

## 2024-01-21 MED ORDER — SPIRONOLACTONE 25 MG PO TABS
50.0000 mg | ORAL_TABLET | Freq: Every day | ORAL | Status: DC
  Administered 2024-01-22 – 2024-02-02 (×12): 50 mg via ORAL
  Filled 2024-01-21 (×12): qty 2

## 2024-01-21 MED ORDER — DIPHENHYDRAMINE HCL 25 MG PO CAPS
25.0000 mg | ORAL_CAPSULE | Freq: Once | ORAL | Status: AC | PRN
  Administered 2024-01-21: 25 mg via ORAL
  Filled 2024-01-21: qty 1

## 2024-01-21 MED ORDER — LIDOCAINE-EPINEPHRINE 1 %-1:100000 IJ SOLN
20.0000 mL | Freq: Once | INTRAMUSCULAR | Status: AC
  Filled 2024-01-21: qty 20

## 2024-01-21 NOTE — Progress Notes (Addendum)
 " PROGRESS NOTE    Joan Wood  FMW:[id number] DOB: November 18, 1970 DOA: 10/03/2023 PCP: Edman Meade PEDLAR, FNP  Subjective: Patient reports abdominal and flank discomfort, and states her ascitic fluid built up again just 24 hours after her last paracentesis. She reports feeling very uncomfortable because of it and asking if she could get a sooner paracentesis than her weekly ones    Hospital Course: 54 y.o. female with a history of decompensated liver cirrhosis, PUD, gastric ulcer perforation status post ex lap, GERD, pancreatitis, obstructive sleep apnea. Patient presented secondary to nausea, coffee-ground emesis, bloody diarrhea with concern for upper GI bleed. 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 Hospitalization complicated by hepatic encephalopathy treated with lactulose  and rifaximin . Patient also requiring recurrent paracenteses for her recurrent ascites.   Assessment and Plan:  #Decompensated alcoholic cirrhosis with  ascites #History of hepatic encephalopathy Paracentesis as needed for symptoms,  - Continue lasix  and aldactone , BP is better and will increase aldactone  - Continue with lactulose ; titrate to 2-3 BMs per day.  She is alert and oriented at this time - appreciate IR, examined all four quadrants and no significant pocket of fluid to allow for safe approach for paracentesis     #Cold sores - improved with acyclovir    #Left eye stye No associated erythema or eye pain - completed erythromycin  ointments    #AKI Secondary to ATN from contrast, renal function back to baseline    #Appendicitis  Earlier during the admission General surgery consulted unfortunately not a candidate for surgical management due to high risk.  Patient was recommended conservative management with antibiotics, completed the antibiotic regimen.  No further abdominal pain    #Hypotension blood pressure somewhat labile but has been better, will see if she can tolerate higher doses of aldactone  with support of midodrine   - continue midodrine  already on fairly high doses   #Hypokalemia #Hypomagnesemia - Replete as needed   #Chronic hyponatremia  Patient's baseline sodium appears to be between 125-130 Continue with fluid restriction.   Continue with lasix  40 mg BID and aldactone  Goal Aldactone  is 100 mg p.o. daily   #Acute upper GI bleed #Acute esophageal necrosis #Acute blood loss anemia S/p a total of 4 units of PRBC transfusion. GI consulted patient underwent endoscopy in September showing evidence of grade D esophagitis in addition for acute esophageal necrosis. - Continue with Protonix  twice daily and outpatient GI follow-up.    #Lower extremity edema Secondary to hypoalbuminemia from liver disease   #Anxiety Continue with hydroxyzine    #History of tobacco abuse Refusing nicotine  patch   #Chronic pain syndrome - cymbalta  relatively contraindicated with her liver disease.   #Severe malnutrition Dietitian recommendations (11/11): - Continue with 1.2 L fluid restriction. - Continue Magic Cup berry and choc BID. Each supplement provides 290 Kcals and 9 grams of protein. - Continue Ensure+HP TID. Each supplement provides 350 kcal and 20 grams of protein. - Continue daily multivitamin PO, 100 mg thiamine  PO daily, and 1 mg folic acid  PO daily. - Continue Juven BID for wound healing.   #Pressure injury Mid sacrum. Unclear if present on admission.     Goals of care Patient has been seen by palliative care and continues to desire full scope medical care.    DVT prophylaxis: SCDs Start: 10/03/23 2153    Code Status: Full Code Disposition Plan: NH Reason for continuing need for hospitalization: med  ready, bed offer   Objective: Vitals:   01/20/24 1940 01/21/24 0331 01/21/24 0902 01/21/24 1727  BP: (!) 141/77  111/65 118/66 (!) 106/59  Pulse: 88 88 93 88  Resp: 18 18 19 18   Temp: 99.8 F (37.7 C) 98.7 F (37.1 C) 97.9 F (36.6 C) 98.6 F (37 C)  TempSrc: Oral Oral Oral Oral  SpO2: 100% 98% 100% 97%  Weight:      Height:        Intake/Output Summary (Last 24 hours) at 01/21/2024 1947 Last data filed at 01/20/2024 1958 Gross per 24 hour  Intake 240 ml  Output --  Net 240 ml   Filed Weights   01/13/24 0500 01/18/24 0500 01/19/24 0712  Weight: 58 kg 65 kg 59.2 kg    Examination:  Physical Exam Vitals and nursing note reviewed.  Cardiovascular:     Rate and Rhythm: Normal rate.  Pulmonary:     Effort: No respiratory distress.  Abdominal:     General: There is no distension.     Tenderness: There is no abdominal tenderness.  Musculoskeletal:     Right lower leg: Edema present.     Left lower leg: Edema present.     Data Reviewed: I have personally reviewed following labs and imaging studies  CBC: Recent Labs  Lab 01/20/24 0152  WBC 4.1  NEUTROABS 2.9  HGB 8.7*  HCT 25.6*  MCV 87.1  PLT 154   Basic Metabolic Panel: Recent Labs  Lab 01/16/24 0157 01/17/24 1010 01/20/24 0152  NA 132* 130* 133*  K 3.9 3.2* 2.9*  CL 97* 95* 97*  CO2 28 28 25   GLUCOSE 101* 121* 77  BUN 20 16 16   CREATININE 0.70 0.57 0.61  CALCIUM  8.1* 8.4* 8.1*   GFR: Estimated Creatinine Clearance: 63.6 mL/min (by C-G formula based on SCr of 0.61 mg/dL). Liver Function Tests: Recent Labs  Lab 01/16/24 0157 01/20/24 0152  AST 47* 52*  ALT 20 22  ALKPHOS 114 116  BILITOT 0.7 0.7  PROT 6.1* 6.5  ALBUMIN  2.2* 2.3*   No results for input(s): LIPASE, AMYLASE in the last 168 hours. No results for input(s): AMMONIA in the last 168 hours. Coagulation Profile: No results for input(s): INR, PROTIME in the last 168 hours. Cardiac Enzymes: No results for input(s): CKTOTAL, CKMB, CKMBINDEX, TROPONINI in the last 168 hours. ProBNP, BNP (last 5 results) No results for  input(s): PROBNP, BNP in the last 8760 hours. HbA1C: No results for input(s): HGBA1C in the last 72 hours. CBG: No results for input(s): GLUCAP in the last 168 hours. Lipid Profile: No results for input(s): CHOL, HDL, LDLCALC, TRIG, CHOLHDL, LDLDIRECT in the last 72 hours. Thyroid  Function Tests: No results for input(s): TSH, T4TOTAL, FREET4, T3FREE, THYROIDAB in the last 72 hours. Anemia Panel: No results for input(s): VITAMINB12, FOLATE, FERRITIN, TIBC, IRON, RETICCTPCT in the last 72 hours. Sepsis Labs: No results for input(s): PROCALCITON, LATICACIDVEN in the last 168 hours.  No results found for this or any previous visit (from the past 240 hours).   Radiology Studies: IR ABDOMEN US  LIMITED Result Date: 01/21/2024 INDICATION: 54 year old female with history of decompensated alcoholic cirrhosis with recurrent ascites. Received request for therapeutic paracentesis. EXAM: ULTRASOUND ABDOMEN LIMITED FINDINGS: All four abdominal quadrants were examined. There was no significant pocket of fluid to allow for safe approach for paracentesis. IMPRESSION: All four abdominal quadrants were examined. There was no significant pocket of fluid to allow for safe approach for paracentesis. No  procedure performed. Read by: Kristi Davenport, NP under the supervision of Dr. Cordella Banner Electronically Signed   By: Cordella Banner   On: 01/21/2024 14:29    Scheduled Meds:  sodium chloride    Intravenous Once   calcium  carbonate  1 tablet Oral TID WC   feeding supplement  237 mL Oral TID BM   folic acid   1 mg Oral Daily   furosemide   40 mg Oral BID   gabapentin   600 mg Oral TID   lactulose   30 g Oral TID   lidocaine   2 patch Transdermal Q24H   lidocaine -EPINEPHrine   20 mL Intradermal Once   magnesium  oxide  400 mg Oral Daily   midodrine   20 mg Oral TID WC   multivitamin with minerals  1 tablet Oral Daily   nutrition supplement (JUVEN)  1 packet Oral  BID BM   pantoprazole   40 mg Oral BID   potassium chloride   20 mEq Oral Daily   QUEtiapine   25 mg Oral QHS   spironolactone   25 mg Oral Daily   thiamine   100 mg Oral Daily   triamcinolone  0.1 % cream : eucerin   Topical Daily   Continuous Infusions:   LOS: 110 days   Time spent: 38 minutes  Casimer Dare, MD  [facility]  01/21/2024, 7:47 PM   "

## 2024-01-21 NOTE — Progress Notes (Signed)
 I was present during limited ultrasound of the abdomen prior to possible paracentesis.   All four abdominal quadrants were examined. There was no significant pocket of fluid to allow for safe approach for paracentesis. No procedure performed.   Daeshon Grammatico B Carmelina Balducci NP 01/21/2024 2:12 PM

## 2024-01-21 NOTE — Progress Notes (Signed)
 Patient is a High Fall Risk, RN provided  Patient  with new patient bracelet, and fall risk band given, Bed alarm on. RN explained to patient.

## 2024-01-21 NOTE — Plan of Care (Signed)
  Problem: Clinical Measurements: Goal: Will remain free from infection Outcome: Progressing Goal: Respiratory complications will improve Outcome: Progressing   Problem: Activity: Goal: Risk for activity intolerance will decrease Outcome: Progressing   Problem: Nutrition: Goal: Adequate nutrition will be maintained Outcome: Progressing   Problem: Coping: Goal: Level of anxiety will decrease Outcome: Progressing   Problem: Pain Managment: Goal: General experience of comfort will improve and/or be controlled Outcome: Progressing   Problem: Safety: Goal: Ability to remain free from injury will improve Outcome: Progressing   Problem: Skin Integrity: Goal: Risk for impaired skin integrity will decrease Outcome: Progressing

## 2024-01-22 ENCOUNTER — Inpatient Hospital Stay (HOSPITAL_COMMUNITY)

## 2024-01-22 DIAGNOSIS — K7460 Unspecified cirrhosis of liver: Secondary | ICD-10-CM | POA: Diagnosis not present

## 2024-01-22 DIAGNOSIS — K7290 Hepatic failure, unspecified without coma: Secondary | ICD-10-CM | POA: Diagnosis not present

## 2024-01-22 LAB — BASIC METABOLIC PANEL WITH GFR
Anion gap: 6 (ref 5–15)
BUN: 15 mg/dL (ref 6–20)
CO2: 30 mmol/L (ref 22–32)
Calcium: 8.3 mg/dL — ABNORMAL LOW (ref 8.9–10.3)
Chloride: 95 mmol/L — ABNORMAL LOW (ref 98–111)
Creatinine, Ser: 0.6 mg/dL (ref 0.44–1.00)
GFR, Estimated: >60 mL/min
Glucose, Bld: 101 mg/dL — ABNORMAL HIGH (ref 70–99)
Potassium: 3.4 mmol/L — ABNORMAL LOW (ref 3.5–5.1)
Sodium: 132 mmol/L — ABNORMAL LOW (ref 135–145)

## 2024-01-22 LAB — CBC
HCT: 26.9 % — ABNORMAL LOW (ref 36.0–46.0)
Hemoglobin: 8.9 g/dL — ABNORMAL LOW (ref 12.0–15.0)
MCH: 28.9 pg (ref 26.0–34.0)
MCHC: 33.1 g/dL (ref 30.0–36.0)
MCV: 87.3 fL (ref 80.0–100.0)
Platelets: 162 K/uL (ref 150–400)
RBC: 3.08 MIL/uL — ABNORMAL LOW (ref 3.87–5.11)
RDW: 14.3 % (ref 11.5–15.5)
WBC: 4.6 K/uL (ref 4.0–10.5)
nRBC: 0 % (ref 0.0–0.2)

## 2024-01-22 LAB — PHOSPHORUS: Phosphorus: 4.1 mg/dL (ref 2.5–4.6)

## 2024-01-22 LAB — MAGNESIUM: Magnesium: 1.6 mg/dL — ABNORMAL LOW (ref 1.7–2.4)

## 2024-01-22 MED ORDER — IOHEXOL 350 MG/ML SOLN
75.0000 mL | Freq: Once | INTRAVENOUS | Status: AC | PRN
  Administered 2024-01-22: 75 mL via INTRAVENOUS

## 2024-01-22 MED ORDER — FUROSEMIDE 10 MG/ML IJ SOLN
40.0000 mg | Freq: Two times a day (BID) | INTRAMUSCULAR | Status: DC
  Administered 2024-01-23 – 2024-01-28 (×12): 40 mg via INTRAVENOUS
  Filled 2024-01-22 (×12): qty 4

## 2024-01-22 NOTE — Progress Notes (Addendum)
 " PROGRESS NOTE    Joan Wood  FMW:[id number] DOB: 11-13-1970 DOA: 10/03/2023 PCP: Edman Meade PEDLAR, FNP  Subjective: No new subjective & objective note has been filed under this hospital service since the last note was generated.    Hospital Course: 54 y.o. female with a history of decompensated liver cirrhosis, PUD, gastric ulcer perforation status post ex lap, GERD, pancreatitis, obstructive sleep apnea. Patient presented secondary to nausea, coffee-ground emesis, bloody diarrhea with concern for upper GI bleed. 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 Hospitalization complicated by hepatic encephalopathy treated with lactulose  and rifaximin . Patient also requiring recurrent paracenteses for her recurrent ascites.   Assessment and Plan:  #Decompensated alcoholic cirrhosis with  ascites #History of hepatic encephalopathy Paracentesis as needed for symptoms,  - Continue lasix  and aldactone , BP is better and will increase aldactone  - Continue with lactulose ; titrate to 2-3 BMs per day.  She is alert and oriented at this time - patient reports being interested in [facility] liver cirrhosis research center that she read about online, likely can be done once she is discharged - appreciate IR, examined all four quadrants and no significant pocket of fluid to allow for safe approach for paracentesis on 1/12 - given her distended abdomen, no pocket of fluid to drain, skin erythema and tenderness, will get a CT abd/pelvis  Update: - Did a CT abd/pelvis and has subcutaneous edema and anasarca, small ascites. Changed to IV lasix  and increased dose, if BP doesn't tolerate can decrease dose. Concern for gut edema and if the PO meds are being absorbed     #Cold sores - improved with acyclovir    #Left eye stye No associated erythema or eye pain -  completed erythromycin  ointments    #AKI Secondary to ATN from contrast, renal function back to baseline    #Appendicitis  Earlier during the admission General surgery consulted unfortunately not a candidate for surgical management due to high risk.  Patient was recommended conservative management with antibiotics, completed the antibiotic regimen. No further abdominal pain    #Hypotension blood pressure somewhat labile but has been better, will see if she can tolerate higher doses of aldactone  with support of midodrine   - continue midodrine  already on fairly high doses   #Hypokalemia #Hypomagnesemia - Replete as needed   #Chronic hyponatremia  Patient's baseline sodium appears to be between 125-130 Continue with fluid restriction.   Continue with lasix  40 mg BID and aldactone  Goal Aldactone  is 100 mg p.o. daily   #Acute upper GI bleed #Acute esophageal necrosis #Acute blood loss anemia S/p a total of 4 units of PRBC transfusion. GI consulted patient underwent endoscopy in September showing evidence of grade D esophagitis in addition for acute esophageal necrosis. - Continue with Protonix  twice daily and outpatient GI follow-up.    #Lower extremity edema Secondary to hypoalbuminemia from liver disease   #Anxiety Continue with hydroxyzine    #History of tobacco abuse Refusing nicotine  patch   #Chronic pain syndrome - cymbalta  relatively contraindicated with her liver disease.   #Severe malnutrition Dietitian recommendations (11/11): - Continue with 1.2 L fluid restriction. - Continue Magic Cup berry and choc BID. Each supplement provides 290 Kcals and 9 grams of protein. - Continue Ensure+HP TID. Each supplement provides 350 kcal and 20 grams of protein. - Continue daily multivitamin PO, 100 mg thiamine  PO daily, and 1 mg folic acid  PO daily. - Continue  Juven BID for wound healing.   #Pressure injury Mid sacrum. Unclear if present on admission.    Goals of  care Patient has been seen by palliative care and continues to desire full scope medical care.     DVT prophylaxis: SCDs Start: 10/03/23 2153    Code Status: Full Code Disposition Plan: Long term SNF Reason for continuing need for hospitalization: bed offer  Objective: Vitals:   01/21/24 2024 01/22/24 0354 01/22/24 0713 01/22/24 1518  BP: 133/82  128/82 122/66  Pulse: 83  (!) 103 87  Resp: 18  19 20   Temp: 98.5 F (36.9 C)  97.8 F (36.6 C) 99.4 F (37.4 C)  TempSrc: Oral  Oral Oral  SpO2: 100%  100% 100%  Weight:  61.7 kg    Height:        Intake/Output Summary (Last 24 hours) at 01/22/2024 1553 Last data filed at 01/22/2024 0725 Gross per 24 hour  Intake 1200 ml  Output --  Net 1200 ml   Filed Weights   01/18/24 0500 01/19/24 0712 01/22/24 0354  Weight: 65 kg 59.2 kg 61.7 kg    Examination:  Physical Exam Vitals and nursing note reviewed.  Constitutional:      General: She is not in acute distress. Cardiovascular:     Rate and Rhythm: Normal rate.  Pulmonary:     Effort: No respiratory distress.     Breath sounds: No wheezing.  Abdominal:     General: There is distension.     Tenderness: There is abdominal tenderness.     Comments: Erythematous skin and warm to touch   Musculoskeletal:     Right lower leg: No edema.     Left lower leg: No edema.     Data Reviewed: I have personally reviewed following labs and imaging studies  CBC: Recent Labs  Lab 01/20/24 0152 01/22/24 1133  WBC 4.1 4.6  NEUTROABS 2.9  --   HGB 8.7* 8.9*  HCT 25.6* 26.9*  MCV 87.1 87.3  PLT 154 162   Basic Metabolic Panel: Recent Labs  Lab 01/16/24 0157 01/17/24 1010 01/20/24 0152 01/22/24 1133  NA 132* 130* 133* 132*  K 3.9 3.2* 2.9* 3.4*  CL 97* 95* 97* 95*  CO2 28 28 25 30   GLUCOSE 101* 121* 77 101*  BUN 20 16 16 15   CREATININE 0.70 0.57 0.61 0.60  CALCIUM  8.1* 8.4* 8.1* 8.3*   GFR: Estimated Creatinine Clearance: 69.4 mL/min (by C-G formula based on SCr  of 0.6 mg/dL). Liver Function Tests: Recent Labs  Lab 01/16/24 0157 01/20/24 0152  AST 47* 52*  ALT 20 22  ALKPHOS 114 116  BILITOT 0.7 0.7  PROT 6.1* 6.5  ALBUMIN  2.2* 2.3*   No results for input(s): LIPASE, AMYLASE in the last 168 hours. No results for input(s): AMMONIA in the last 168 hours. Coagulation Profile: No results for input(s): INR, PROTIME in the last 168 hours. Cardiac Enzymes: No results for input(s): CKTOTAL, CKMB, CKMBINDEX, TROPONINI in the last 168 hours. ProBNP, BNP (last 5 results) No results for input(s): PROBNP, BNP in the last 8760 hours. HbA1C: No results for input(s): HGBA1C in the last 72 hours. CBG: No results for input(s): GLUCAP in the last 168 hours. Lipid Profile: No results for input(s): CHOL, HDL, LDLCALC, TRIG, CHOLHDL, LDLDIRECT in the last 72 hours. Thyroid  Function Tests: No results for input(s): TSH, T4TOTAL, FREET4, T3FREE, THYROIDAB in the last 72 hours. Anemia Panel: No results for input(s): VITAMINB12, FOLATE, FERRITIN, TIBC, IRON,  RETICCTPCT in the last 72 hours. Sepsis Labs: No results for input(s): PROCALCITON, LATICACIDVEN in the last 168 hours.  No results found for this or any previous visit (from the past 240 hours).   Radiology Studies: IR ABDOMEN US  LIMITED Result Date: 01/21/2024 INDICATION: 54 year old female with history of decompensated alcoholic cirrhosis with recurrent ascites. Received request for therapeutic paracentesis. EXAM: ULTRASOUND ABDOMEN LIMITED FINDINGS: All four abdominal quadrants were examined. There was no significant pocket of fluid to allow for safe approach for paracentesis. IMPRESSION: All four abdominal quadrants were examined. There was no significant pocket of fluid to allow for safe approach for paracentesis. No procedure performed. Read by: Kristi Davenport, NP under the supervision of Dr. Cordella Banner Electronically Signed   By:  Cordella Banner   On: 01/21/2024 14:29    Scheduled Meds:  sodium chloride    Intravenous Once   calcium  carbonate  1 tablet Oral TID WC   feeding supplement  237 mL Oral TID BM   folic acid   1 mg Oral Daily   furosemide   40 mg Oral BID   gabapentin   600 mg Oral TID   lactulose   30 g Oral TID   lidocaine   2 patch Transdermal Q24H   lidocaine -EPINEPHrine   20 mL Intradermal Once   magnesium  oxide  400 mg Oral Daily   midodrine   20 mg Oral TID WC   multivitamin with minerals  1 tablet Oral Daily   nutrition supplement (JUVEN)  1 packet Oral BID BM   pantoprazole   40 mg Oral BID   potassium chloride   20 mEq Oral Daily   QUEtiapine   25 mg Oral QHS   spironolactone   50 mg Oral Daily   thiamine   100 mg Oral Daily   triamcinolone  0.1 % cream : eucerin   Topical Daily   Continuous Infusions:   LOS: 111 days   Time spent: 40 minutes  Casimer Dare, MD  [facility]  01/22/2024, 3:53 PM   "

## 2024-01-22 NOTE — Plan of Care (Signed)
  Problem: Health Behavior/Discharge Planning: Goal: Ability to manage health-related needs will improve Outcome: Progressing   Problem: Activity: Goal: Risk for activity intolerance will decrease Outcome: Progressing   Problem: Nutrition: Goal: Adequate nutrition will be maintained Outcome: Progressing   

## 2024-01-23 ENCOUNTER — Telehealth: Payer: Self-pay | Admitting: *Deleted

## 2024-01-23 DIAGNOSIS — K275 Chronic or unspecified peptic ulcer, site unspecified, with perforation: Secondary | ICD-10-CM

## 2024-01-23 DIAGNOSIS — K7290 Hepatic failure, unspecified without coma: Secondary | ICD-10-CM | POA: Diagnosis not present

## 2024-01-23 DIAGNOSIS — K7460 Unspecified cirrhosis of liver: Secondary | ICD-10-CM | POA: Diagnosis not present

## 2024-01-23 MED ORDER — MUPIROCIN CALCIUM 2 % EX CREA
TOPICAL_CREAM | Freq: Two times a day (BID) | CUTANEOUS | Status: AC
  Administered 2024-01-24: 1 via TOPICAL
  Filled 2024-01-23: qty 15

## 2024-01-23 MED ORDER — MAGNESIUM OXIDE -MG SUPPLEMENT 400 (240 MG) MG PO TABS
800.0000 mg | ORAL_TABLET | Freq: Once | ORAL | Status: AC
  Administered 2024-01-23: 800 mg via ORAL
  Filled 2024-01-23: qty 2

## 2024-01-23 MED ORDER — MUPIROCIN 2 % EX OINT: TOPICAL_OINTMENT | Freq: Three times a day (TID) | CUTANEOUS | Status: DC

## 2024-01-23 NOTE — Plan of Care (Signed)
   Problem: Activity: Goal: Risk for activity intolerance will decrease Outcome: Progressing   Problem: Nutrition: Goal: Adequate nutrition will be maintained Outcome: Progressing   Problem: Elimination: Goal: Will not experience complications related to bowel motility Outcome: Progressing

## 2024-01-23 NOTE — Progress Notes (Signed)
 Chart reviewed for health plan referral. Patient is currently admitted to the hospital since 10/03/2023. Ongoing inpatient care noted.  Harlene Satterfield  [facility]  [facility], [facility] Guide  Direct Dial: [phone]  Fax [phone]

## 2024-01-23 NOTE — Progress Notes (Signed)
 Patient called to use the restroom. RN answered call light and came to her room, bed alarm was already off and patient standing at EOB. Patient was educated several times about keeping bed alarm because of high fall risk score.

## 2024-01-23 NOTE — Plan of Care (Signed)
  Problem: Health Behavior/Discharge Planning: Goal: Ability to manage health-related needs will improve Outcome: Progressing   Problem: Clinical Measurements: Goal: Ability to maintain clinical measurements within normal limits will improve Outcome: Progressing   Problem: Coping: Goal: Level of anxiety will decrease Outcome: Progressing   Problem: Pain Managment: Goal: General experience of comfort will improve and/or be controlled Outcome: Progressing

## 2024-01-23 NOTE — TOC Progression Note (Signed)
 Transition of Care [facility]) - Progression Note    Patient Details  Name: MOREEN PIGGOTT MRN: [id number] Date of Birth: 1970/12/11  [facility] CM/SW Contact  [name], [name] Phone Number: 01/23/2024, 1:32 PM  Clinical Narrative:   CSW had a note left by patient overnight that she could go stay with her dad at discharge. CSW spoke with patient's son, [name], to confirm, and received patient's dad's number. Patient in agreement for CSW to contact her dad Marciana [phone]). CSW attempted to reach dad, but left a voicemail; awaiting call back. CSW met with patient to update that a voicemail was left for her dad, and patient said it wasn't her dad, it was her daughter. CSW verified the note left said her dad. Per patient, when her daughter visited, she said she had a spare room and a car now so she would take the patient home. CSW attempted to reach daughter, Duwaine, to confirm and discuss, left a voicemail. CSW awaiting call back.     Expected Discharge Plan: Skilled Nursing Facility Barriers to Discharge: Homeless with medical needs, Inadequate or no insurance, Continued Medical Work up, Unsafe home situation               Expected Discharge Plan and Services In-house Referral: Clinical Social Work Discharge Planning Services: CM Consult Post Acute Care Choice: Home Health Living arrangements for the past 2 months: Single Family Home                 DME Arranged: Walker rolling   Date DME Agency Contacted: 10/08/23   Representative spoke with at DME Agency: [name]             Social Drivers of Health (SDOH) Interventions SDOH Screenings   Food Insecurity: No Food Insecurity (10/03/2023)  Housing: Low Risk (10/03/2023)  Transportation Needs: No Transportation Needs (10/03/2023)  Recent Concern: Transportation Needs - Unmet Transportation Needs (08/20/2023)  Utilities: Not At Risk (10/03/2023)  Depression (PHQ2-9): High Risk (07/30/2023)  Social  Connections: Socially Isolated (10/03/2023)  Tobacco Use: High Risk (10/04/2023)    Readmission Risk Interventions    10/04/2023    9:02 AM 09/03/2023   12:31 PM 09/02/2023    9:01 AM  Readmission Risk Prevention Plan  Transportation Screening Complete Complete Complete  PCP or Specialist Appt within 3-5 Days   Complete  HRI or Home Care Consult   Complete  Social Work Consult for Recovery Care Planning/Counseling   Complete  Palliative Care Screening   Not Applicable  Medication Review Oceanographer) Complete Complete Complete  HRI or Home Care Consult Complete Complete   SW Recovery Care/Counseling Consult Complete Complete   Palliative Care Screening Not Applicable Not Applicable   Skilled Nursing Facility Not Applicable Patient Refused

## 2024-01-23 NOTE — Progress Notes (Signed)
 " PROGRESS NOTE    Joan Wood  FMW:[id number] DOB: [birth year] DOA: 10/03/2023 PCP: Edman Meade PEDLAR, FNP    Brief Narrative:  54 year old with history of decompensated liver cirrhosis, PUD, gastric ulcer perforation requiring exploratory laparotomy, GERD, pancreatitis, OSA admitted initially for coffee-ground emesis and bloody diarrhea.  Consulted GI underwent endoscopy which revealed nonbleeding ulcer and esophagitis and some concerns of esophageal necrosis.  This was treated by GI team and IR had also been consulted.  Hospital course also complicated by hepatic encephalopathy requiring lactulose  and rifaximin .   Assessment & Plan:  Decompensated alcoholic liver cirrhosis Hepatic encephalopathy Recurrent ascites -Earlier in the admission patient was seen by gastroenterology and had undergone extensive workup including endoscopy, received NAC protocol for 5 days.  She reports also been getting Lasix  and rifaximin  along with PPI.  Nothing further to add from gastroenterology standpoint besides outpatient follow-up therefore their service had signed off. -IR is performing periodic paracentesis  Cold sores  - improved with acyclovir    Left eye stye No associated erythema or eye pain - completed erythromycin  ointments   Left torso folliculitis/boil - Ordered topical mupirocin    AKI -Secondary to ATN from contrast, renal function back to baseline   Bilateral sacral insufficiency fracture - Seen on the CT scan.  Supportive care, pain control   Appendicitis  -Earlier during the admission, general surgery recommended conservative management General surgery consulted unfortunately not a candidate for surgical management due to high risk.  Patient was recommended conservative management with antibiotics, completed the antibiotic regimen. No further abdominal pain    Hypokalemia Hypomagnesemia - Replete as needed   Chronic hyponatremia  -Secondary to underlying cirrhosis   ACute  upper GI bleed S/p a total of 4 units of PRBC transfusion. -By GI had undergone endoscopy earlier in admission.  Continue PPI    Lower extremity edema -Secondary to hypoalbuminemia from liver disease   Anxiety -Continue with hydroxyzine    History of tobacco abuse -Refusing nicotine  patch   Chronic pain syndrome - cymbalta  relatively contraindicated with her liver disease.   Severe malnutrition Dietitian recommendations (11/11): - Continue with 1.2 L fluid restriction. - Continue Magic Cup berry and choc BID. Each supplement provides 290 Kcals and 9 grams of protein. - Continue Ensure+HP TID. Each supplement provides 350 kcal and 20 grams of protein. - Continue daily multivitamin PO, 100 mg thiamine  PO daily, and 1 mg folic acid  PO daily. - Continue Juven BID for wound healing.   Pressure injury -Mid sacrum. Unclear if present on admission.    Goals of care Patient has been seen by palliative care and continues to desire full scope medical care.    DVT prophylaxis: SCDs Start: 10/03/23 2153    Code Status: Full Code Family Communication:   Status is: Inpatient Remains inpatient appropriate because: Placement   PT Follow up Recs: Skilled Nursing-Short Term Rehab (<3 Hours/Day)12/07/2023 1703  Subjective: Seen at bedside no complaints Asking me if she can have freedom to walk around the floor.  I have asked her to discuss with nursing staff.  If they feel she is stable enough from my side she is able to ambulate as long as she is supervised and remains on the floor   Examination:  General exam: Appears calm and comfortable  Respiratory system: Clear to auscultation. Respiratory effort normal. Cardiovascular system: S1 & S2 heard, RRR. No JVD, murmurs, rubs, gallops or clicks. No pedal edema. Gastrointestinal system: Abdomen is nondistended, soft and nontender. No organomegaly or masses  felt. Normal bowel sounds heard. Central nervous system: Alert and oriented. No  focal neurological deficits. Extremities: Symmetric 5 x 5 power. Skin: No rashes, lesions or ulcers Psychiatry: Judgement and insight appear normal. Mood & affect appropriate.            Wound 10/15/23 0900 Pressure Injury Sacrum Mid Stage 2 -  Partial thickness loss of dermis presenting as a shallow open injury with a red, pink wound bed without slough. (Active)     Diet Orders (From admission, onward)     Start     Ordered   11/23/23 1202  Diet 2 gram sodium Room service appropriate? Yes with Assist; Fluid consistency: Thin; Fluid restriction: 1500 mL Fluid  Diet effective now       Question Answer Comment  Room service appropriate? Yes with Assist   Fluid consistency: Thin   Fluid restriction: 1500 mL Fluid      11/23/23 1202            Objective: Vitals:   01/22/24 0713 01/22/24 1518 01/22/24 2021 01/23/24 0811  BP: 128/82 122/66 111/70 124/70  Pulse: (!) 103 87 91 98  Resp: 19 20  18   Temp: 97.8 F (36.6 C) 99.4 F (37.4 C) 98.1 F (36.7 C) 97.9 F (36.6 C)  TempSrc: Oral Oral Oral Oral  SpO2: 100% 100% 100% 100%  Weight:      Height:        Intake/Output Summary (Last 24 hours) at 01/23/2024 1356 Last data filed at 01/23/2024 9344 Gross per 24 hour  Intake 720 ml  Output --  Net 720 ml   Filed Weights   01/18/24 0500 01/19/24 0712 01/22/24 0354  Weight: 65 kg 59.2 kg 61.7 kg    Scheduled Meds:  sodium chloride    Intravenous Once   calcium  carbonate  1 tablet Oral TID WC   feeding supplement  237 mL Oral TID BM   folic acid   1 mg Oral Daily   furosemide   40 mg Intravenous BID   gabapentin   600 mg Oral TID   lactulose   30 g Oral TID   lidocaine   2 patch Transdermal Q24H   lidocaine -EPINEPHrine   20 mL Intradermal Once   magnesium  oxide  400 mg Oral Daily   midodrine   20 mg Oral TID WC   multivitamin with minerals  1 tablet Oral Daily   mupirocin  cream   Topical BID   nutrition supplement (JUVEN)  1 packet Oral BID BM   pantoprazole   40  mg Oral BID   potassium chloride   20 mEq Oral Daily   QUEtiapine   25 mg Oral QHS   spironolactone   50 mg Oral Daily   thiamine   100 mg Oral Daily   triamcinolone  0.1 % cream : eucerin   Topical Daily   Continuous Infusions:  Nutritional status Signs/Symptoms: severe fat depletion, severe muscle depletion Interventions: Ensure Enlive (each supplement provides 350kcal and 20 grams of protein), MVI, Liberalize Diet, Magic cup Body mass index is 24.89 kg/m.  Data Reviewed:   CBC: Recent Labs  Lab 01/20/24 0152 01/22/24 1133  WBC 4.1 4.6  NEUTROABS 2.9  --   HGB 8.7* 8.9*  HCT 25.6* 26.9*  MCV 87.1 87.3  PLT 154 162   Basic Metabolic Panel: Recent Labs  Lab 01/17/24 1010 01/20/24 0152 01/22/24 1133  NA 130* 133* 132*  K 3.2* 2.9* 3.4*  CL 95* 97* 95*  CO2 28 25 30   GLUCOSE 121* 77 101*  BUN 16 16  15  CREATININE 0.57 0.61 0.60  CALCIUM  8.4* 8.1* 8.3*  MG  --   --  1.6*  PHOS  --   --  4.1   GFR: Estimated Creatinine Clearance: 69.4 mL/min (by C-G formula based on SCr of 0.6 mg/dL). Liver Function Tests: Recent Labs  Lab 01/20/24 0152  AST 52*  ALT 22  ALKPHOS 116  BILITOT 0.7  PROT 6.5  ALBUMIN  2.3*   No results for input(s): LIPASE, AMYLASE in the last 168 hours. No results for input(s): AMMONIA in the last 168 hours. Coagulation Profile: No results for input(s): INR, PROTIME in the last 168 hours. Cardiac Enzymes: No results for input(s): CKTOTAL, CKMB, CKMBINDEX, TROPONINI in the last 168 hours. BNP (last 3 results) No results for input(s): PROBNP in the last 8760 hours. HbA1C: No results for input(s): HGBA1C in the last 72 hours. CBG: No results for input(s): GLUCAP in the last 168 hours. Lipid Profile: No results for input(s): CHOL, HDL, LDLCALC, TRIG, CHOLHDL, LDLDIRECT in the last 72 hours. Thyroid  Function Tests: No results for input(s): TSH, T4TOTAL, FREET4, T3FREE, THYROIDAB in the last 72  hours. Anemia Panel: No results for input(s): VITAMINB12, FOLATE, FERRITIN, TIBC, IRON, RETICCTPCT in the last 72 hours. Sepsis Labs: No results for input(s): PROCALCITON, LATICACIDVEN in the last 168 hours.  No results found for this or any previous visit (from the past 240 hours).       Radiology Studies: CT ABDOMEN PELVIS W CONTRAST Result Date: 01/22/2024 CLINICAL DATA:  Abdominal pain. EXAM: CT ABDOMEN AND PELVIS WITH CONTRAST TECHNIQUE: Multidetector CT imaging of the abdomen and pelvis was performed using the standard protocol following bolus administration of intravenous contrast. RADIATION DOSE REDUCTION: This exam was performed according to the departmental dose-optimization program which includes automated exposure control, adjustment of the mA and/or kV according to patient size and/or use of iterative reconstruction technique. CONTRAST:  75mL OMNIPAQUE  IOHEXOL  350 MG/ML SOLN COMPARISON:  Pelvic CT dated 12/18/2023. FINDINGS: Lower chest: The visualized lung bases are clear. No intra-abdominal free air.  Small ascites. Hepatobiliary: Cirrhosis. No biliary dilatation. Subcentimeter hypodense lesion in the right lobe of the liver is not characterized on this CT. Further evaluation with ultrasound on a nonemergent/outpatient basis recommended. The gallbladder is unremarkable. Pancreas: Unremarkable. No pancreatic ductal dilatation or surrounding inflammatory changes. Spleen: Normal in size without focal abnormality. Adrenals/Urinary Tract: The adrenal glands unremarkable. There is no hydronephrosis on either side. The urinary bladder is grossly unremarkable. Stomach/Bowel: Evaluation of the bowel is limited in the absence of oral contrast. No bowel obstruction. Vascular/Lymphatic: Mild aortoiliac atherosclerotic disease. The IVC is unremarkable. No portal venous gas. There is no adenopathy. Reproductive: The uterus is anteverted. No suspicious adnexal masses. Other: Diffuse  subcutaneous edema and anasarca. Musculoskeletal: Osteopenia with degenerative changes. Insufficiency fracture of the right, and possibly left, sacral ala. IMPRESSION: 1. Cirrhosis with small ascites. 2. No bowel obstruction. 3. Insufficiency fracture of the right, and possibly left, sacral ala. 4.  Aortic Atherosclerosis (ICD10-I70.0). Electronically Signed   By: Vanetta Chou M.D.   On: 01/22/2024 17:51   IR ABDOMEN US  LIMITED Result Date: 01/21/2024 INDICATION: 54 year old female with history of decompensated alcoholic cirrhosis with recurrent ascites. Received request for therapeutic paracentesis. EXAM: ULTRASOUND ABDOMEN LIMITED FINDINGS: All four abdominal quadrants were examined. There was no significant pocket of fluid to allow for safe approach for paracentesis. IMPRESSION: All four abdominal quadrants were examined. There was no significant pocket of fluid to allow for safe approach for  paracentesis. No procedure performed. Read by: Rayfield Buff, NP under the supervision of Dr. Cordella Banner Electronically Signed   By: Cordella Banner   On: 01/21/2024 14:29           LOS: 112 days   Time spent= 35 mins    Burgess JAYSON Dare, MD [facility]  If 7PM-7AM, please contact night-coverage  01/23/2024, 1:56 PM  "

## 2024-01-23 NOTE — Progress Notes (Signed)
 " PROGRESS NOTE    Joan Wood  FMW:[id number] DOB: 1970-11-06 DOA: 10/03/2023 PCP: Edman Meade PEDLAR, FNP    Brief Narrative:  54 year old with history of decompensated liver cirrhosis, PUD, gastric ulcer perforation requiring exploratory laparotomy, GERD, pancreatitis, OSA admitted initially for coffee-ground emesis and bloody diarrhea.  Consulted GI underwent endoscopy which revealed nonbleeding ulcer and esophagitis and some concerns of esophageal necrosis.  This was treated by GI team and IR had also been consulted.  Hospital course also complicated by hepatic encephalopathy requiring lactulose  and rifaximin .   Assessment & Plan:  Decompensated alcoholic liver cirrhosis Hepatic encephalopathy Recurrent ascites -Earlier in the admission patient was seen by gastroenterology and had undergone extensive workup including endoscopy, received NAC protocol for 5 days.  She reports also been getting Lasix  and rifaximin  along with PPI.  Nothing further to add from gastroenterology standpoint besides outpatient follow-up therefore their service had signed off. -IR is performing periodic paracentesis  Cold sores  - improved with acyclovir    Left eye stye No associated erythema or eye pain - completed erythromycin  ointments   Left torso folliculitis/boil - Ordered topical mupirocin    AKI -Secondary to ATN from contrast, renal function back to baseline    Appendicitis  -Earlier during the admission, general surgery recommended conservative management General surgery consulted unfortunately not a candidate for surgical management due to high risk.  Patient was recommended conservative management with antibiotics, completed the antibiotic regimen. No further abdominal pain    Hypokalemia Hypomagnesemia - Replete as needed   Chronic hyponatremia  -Secondary to underlying cirrhosis   Cute upper GI bleed S/p a total of 4 units of PRBC transfusion. -By GI had undergone endoscopy earlier  in admission.  Continue PPI    Lower extremity edema -Secondary to hypoalbuminemia from liver disease   Anxiety -Continue with hydroxyzine    History of tobacco abuse -Refusing nicotine  patch   Chronic pain syndrome - cymbalta  relatively contraindicated with her liver disease.   Severe malnutrition Dietitian recommendations (11/11): - Continue with 1.2 L fluid restriction. - Continue Magic Cup berry and choc BID. Each supplement provides 290 Kcals and 9 grams of protein. - Continue Ensure+HP TID. Each supplement provides 350 kcal and 20 grams of protein. - Continue daily multivitamin PO, 100 mg thiamine  PO daily, and 1 mg folic acid  PO daily. - Continue Juven BID for wound healing.   Pressure injury -Mid sacrum. Unclear if present on admission.    Goals of care Patient has been seen by palliative care and continues to desire full scope medical care.    DVT prophylaxis: SCDs Start: 10/03/23 2153    Code Status: Full Code Family Communication:   Status is: Inpatient Remains inpatient appropriate because: Placement   PT Follow up Recs: Skilled Nursing-Short Term Rehab (<3 Hours/Day)12/07/2023 1703  Subjective: Seen at bedside no complaints Asking me if she can have freedom to walk around the floor.  I have asked her to discuss with nursing staff.  If they feel she is stable enough from my side she is able to ambulate as long as she is supervised and remains on the floor   Examination:  General exam: Appears calm and comfortable  Respiratory system: Clear to auscultation. Respiratory effort normal. Cardiovascular system: S1 & S2 heard, RRR. No JVD, murmurs, rubs, gallops or clicks. No pedal edema. Gastrointestinal system: Abdomen is nondistended, soft and nontender. No organomegaly or masses felt. Normal bowel sounds heard. Central nervous system: Alert and oriented. No focal neurological deficits. Extremities:  Symmetric 5 x 5 power. Skin: No rashes, lesions or  ulcers Psychiatry: Judgement and insight appear normal. Mood & affect appropriate.            Wound 10/15/23 0900 Pressure Injury Sacrum Mid Stage 2 -  Partial thickness loss of dermis presenting as a shallow open injury with a red, pink wound bed without slough. (Active)     Diet Orders (From admission, onward)     Start     Ordered   11/23/23 1202  Diet 2 gram sodium Room service appropriate? Yes with Assist; Fluid consistency: Thin; Fluid restriction: 1500 mL Fluid  Diet effective now       Question Answer Comment  Room service appropriate? Yes with Assist   Fluid consistency: Thin   Fluid restriction: 1500 mL Fluid      11/23/23 1202            Objective: Vitals:   01/22/24 0713 01/22/24 1518 01/22/24 2021 01/23/24 0811  BP: 128/82 122/66 111/70 124/70  Pulse: (!) 103 87 91 98  Resp: 19 20  18   Temp: 97.8 F (36.6 C) 99.4 F (37.4 C) 98.1 F (36.7 C) 97.9 F (36.6 C)  TempSrc: Oral Oral Oral Oral  SpO2: 100% 100% 100% 100%  Weight:      Height:        Intake/Output Summary (Last 24 hours) at 01/23/2024 1354 Last data filed at 01/23/2024 9344 Gross per 24 hour  Intake 720 ml  Output --  Net 720 ml   Filed Weights   01/18/24 0500 01/19/24 0712 01/22/24 0354  Weight: 65 kg 59.2 kg 61.7 kg    Scheduled Meds:  sodium chloride    Intravenous Once   calcium  carbonate  1 tablet Oral TID WC   feeding supplement  237 mL Oral TID BM   folic acid   1 mg Oral Daily   furosemide   40 mg Intravenous BID   gabapentin   600 mg Oral TID   lactulose   30 g Oral TID   lidocaine   2 patch Transdermal Q24H   lidocaine -EPINEPHrine   20 mL Intradermal Once   magnesium  oxide  400 mg Oral Daily   midodrine   20 mg Oral TID WC   multivitamin with minerals  1 tablet Oral Daily   mupirocin  cream   Topical BID   nutrition supplement (JUVEN)  1 packet Oral BID BM   pantoprazole   40 mg Oral BID   potassium chloride   20 mEq Oral Daily   QUEtiapine   25 mg Oral QHS    spironolactone   50 mg Oral Daily   thiamine   100 mg Oral Daily   triamcinolone  0.1 % cream : eucerin   Topical Daily   Continuous Infusions:  Nutritional status Signs/Symptoms: severe fat depletion, severe muscle depletion Interventions: Ensure Enlive (each supplement provides 350kcal and 20 grams of protein), MVI, Liberalize Diet, Magic cup Body mass index is 24.89 kg/m.  Data Reviewed:   CBC: Recent Labs  Lab 01/20/24 0152 01/22/24 1133  WBC 4.1 4.6  NEUTROABS 2.9  --   HGB 8.7* 8.9*  HCT 25.6* 26.9*  MCV 87.1 87.3  PLT 154 162   Basic Metabolic Panel: Recent Labs  Lab 01/17/24 1010 01/20/24 0152 01/22/24 1133  NA 130* 133* 132*  K 3.2* 2.9* 3.4*  CL 95* 97* 95*  CO2 28 25 30   GLUCOSE 121* 77 101*  BUN 16 16 15   CREATININE 0.57 0.61 0.60  CALCIUM  8.4* 8.1* 8.3*  MG  --   --  1.6*  PHOS  --   --  4.1   GFR: Estimated Creatinine Clearance: 69.4 mL/min (by C-G formula based on SCr of 0.6 mg/dL). Liver Function Tests: Recent Labs  Lab 01/20/24 0152  AST 52*  ALT 22  ALKPHOS 116  BILITOT 0.7  PROT 6.5  ALBUMIN  2.3*   No results for input(s): LIPASE, AMYLASE in the last 168 hours. No results for input(s): AMMONIA in the last 168 hours. Coagulation Profile: No results for input(s): INR, PROTIME in the last 168 hours. Cardiac Enzymes: No results for input(s): CKTOTAL, CKMB, CKMBINDEX, TROPONINI in the last 168 hours. BNP (last 3 results) No results for input(s): PROBNP in the last 8760 hours. HbA1C: No results for input(s): HGBA1C in the last 72 hours. CBG: No results for input(s): GLUCAP in the last 168 hours. Lipid Profile: No results for input(s): CHOL, HDL, LDLCALC, TRIG, CHOLHDL, LDLDIRECT in the last 72 hours. Thyroid  Function Tests: No results for input(s): TSH, T4TOTAL, FREET4, T3FREE, THYROIDAB in the last 72 hours. Anemia Panel: No results for input(s): VITAMINB12, FOLATE, FERRITIN,  TIBC, IRON, RETICCTPCT in the last 72 hours. Sepsis Labs: No results for input(s): PROCALCITON, LATICACIDVEN in the last 168 hours.  No results found for this or any previous visit (from the past 240 hours).       Radiology Studies: CT ABDOMEN PELVIS W CONTRAST Result Date: 01/22/2024 CLINICAL DATA:  Abdominal pain. EXAM: CT ABDOMEN AND PELVIS WITH CONTRAST TECHNIQUE: Multidetector CT imaging of the abdomen and pelvis was performed using the standard protocol following bolus administration of intravenous contrast. RADIATION DOSE REDUCTION: This exam was performed according to the departmental dose-optimization program which includes automated exposure control, adjustment of the mA and/or kV according to patient size and/or use of iterative reconstruction technique. CONTRAST:  75mL OMNIPAQUE  IOHEXOL  350 MG/ML SOLN COMPARISON:  Pelvic CT dated 12/18/2023. FINDINGS: Lower chest: The visualized lung bases are clear. No intra-abdominal free air.  Small ascites. Hepatobiliary: Cirrhosis. No biliary dilatation. Subcentimeter hypodense lesion in the right lobe of the liver is not characterized on this CT. Further evaluation with ultrasound on a nonemergent/outpatient basis recommended. The gallbladder is unremarkable. Pancreas: Unremarkable. No pancreatic ductal dilatation or surrounding inflammatory changes. Spleen: Normal in size without focal abnormality. Adrenals/Urinary Tract: The adrenal glands unremarkable. There is no hydronephrosis on either side. The urinary bladder is grossly unremarkable. Stomach/Bowel: Evaluation of the bowel is limited in the absence of oral contrast. No bowel obstruction. Vascular/Lymphatic: Mild aortoiliac atherosclerotic disease. The IVC is unremarkable. No portal venous gas. There is no adenopathy. Reproductive: The uterus is anteverted. No suspicious adnexal masses. Other: Diffuse subcutaneous edema and anasarca. Musculoskeletal: Osteopenia with degenerative changes.  Insufficiency fracture of the right, and possibly left, sacral ala. IMPRESSION: 1. Cirrhosis with small ascites. 2. No bowel obstruction. 3. Insufficiency fracture of the right, and possibly left, sacral ala. 4.  Aortic Atherosclerosis (ICD10-I70.0). Electronically Signed   By: Vanetta Chou M.D.   On: 01/22/2024 17:51   IR ABDOMEN US  LIMITED Result Date: 01/21/2024 INDICATION: 54 year old female with history of decompensated alcoholic cirrhosis with recurrent ascites. Received request for therapeutic paracentesis. EXAM: ULTRASOUND ABDOMEN LIMITED FINDINGS: All four abdominal quadrants were examined. There was no significant pocket of fluid to allow for safe approach for paracentesis. IMPRESSION: All four abdominal quadrants were examined. There was no significant pocket of fluid to allow for safe approach for paracentesis. No procedure performed. Read by: Kristi Davenport, NP under the supervision of Dr. Cordella Banner Electronically Signed  By: Cordella Banner   On: 01/21/2024 14:29           LOS: 112 days   Time spent= 35 mins    Burgess JAYSON Dare, MD [facility]  If 7PM-7AM, please contact night-coverage  01/23/2024, 1:54 PM  "

## 2024-01-23 NOTE — Progress Notes (Signed)
 This RN responded to bed alarm last shift, and when arriving in room, pt was observed silencing and disarming the bed alarm

## 2024-01-24 DIAGNOSIS — K7460 Unspecified cirrhosis of liver: Secondary | ICD-10-CM | POA: Diagnosis not present

## 2024-01-24 DIAGNOSIS — K7290 Hepatic failure, unspecified without coma: Secondary | ICD-10-CM | POA: Diagnosis not present

## 2024-01-24 MED ORDER — POTASSIUM CHLORIDE CRYS ER 20 MEQ PO TBCR
40.0000 meq | EXTENDED_RELEASE_TABLET | Freq: Once | ORAL | Status: AC
  Administered 2024-01-24: 40 meq via ORAL
  Filled 2024-01-24: qty 2

## 2024-01-24 MED ORDER — MAGNESIUM OXIDE -MG SUPPLEMENT 400 (240 MG) MG PO TABS
800.0000 mg | ORAL_TABLET | Freq: Once | ORAL | Status: AC
  Administered 2024-01-24: 800 mg via ORAL
  Filled 2024-01-24: qty 2

## 2024-01-24 NOTE — Progress Notes (Signed)
 Pt continues to be non-compliant with the fall risk measures. Pt is aware of how to turn off bed alarm. After this nurse has educated pt on the importance of the bed alarm and to use the call bell, pt continues to get out of bed unassisted. Pt has fallen during this admission, will continue to educate pt on importance of fall prevention and safety

## 2024-01-24 NOTE — Progress Notes (Signed)
 " PROGRESS NOTE    Joan Wood  FMW:[id number] DOB: Dec 19, 1970 DOA: 10/03/2023 PCP: Edman Meade PEDLAR, FNP    Brief Narrative:  54 year old with history of decompensated liver cirrhosis, PUD, gastric ulcer perforation requiring exploratory laparotomy, GERD, pancreatitis, OSA admitted initially for coffee-ground emesis and bloody diarrhea.  Consulted GI underwent endoscopy which revealed nonbleeding ulcer and esophagitis and some concerns of esophageal necrosis.  This was treated by GI team and IR had also been consulted.  Hospital course also complicated by hepatic encephalopathy requiring lactulose  and rifaximin .   Assessment & Plan:  Decompensated alcoholic liver cirrhosis Hepatic encephalopathy Recurrent ascites -Earlier in the admission patient was seen by gastroenterology and had undergone extensive workup including endoscopy, received NAC protocol for 5 days.  She reports also been getting Lasix  and rifaximin  along with PPI.  Nothing further to add from gastroenterology standpoint besides outpatient follow-up therefore their service had signed off. -IR is performing periodic paracentesis  Cold sores  - improved with acyclovir    Left eye stye No associated erythema or eye pain - completed erythromycin  ointments   Left torso folliculitis/boil - Ordered topical mupirocin    AKI -Secondary to ATN from contrast, renal function back to baseline   Bilateral sacral insufficiency fracture - Seen on the CT scan.  Supportive care, pain control   Appendicitis  -Earlier during the admission, general surgery recommended conservative management General surgery consulted unfortunately not a candidate for surgical management due to high risk.  Patient was recommended conservative management with antibiotics, completed the antibiotic regimen. No further abdominal pain    Hypokalemia Hypomagnesemia - Replete as needed   Chronic hyponatremia  -Secondary to underlying cirrhosis   ACute  upper GI bleed S/p a total of 4 units of PRBC transfusion. -By GI had undergone endoscopy earlier in admission.  Continue PPI    Lower extremity edema -Secondary to hypoalbuminemia from liver disease   Anxiety -Continue with hydroxyzine    History of tobacco abuse -Refusing nicotine  patch   Chronic pain syndrome - cymbalta  relatively contraindicated with her liver disease.   Severe malnutrition Dietitian recommendations (11/11): - Continue with 1.2 L fluid restriction. - Continue Magic Cup berry and choc BID. Each supplement provides 290 Kcals and 9 grams of protein. - Continue Ensure+HP TID. Each supplement provides 350 kcal and 20 grams of protein. - Continue daily multivitamin PO, 100 mg thiamine  PO daily, and 1 mg folic acid  PO daily. - Continue Juven BID for wound healing.   Pressure injury -Mid sacrum. Unclear if present on admission.    Goals of care Patient has been seen by palliative care and continues to desire full scope medical care.    DVT prophylaxis: SCDs Start: 10/03/23 2153    Code Status: Full Code Family Communication:   Status is: Inpatient Remains inpatient appropriate because: Placement   PT Follow up Recs: Skilled Nursing-Short Term Rehab (<3 Hours/Day)12/07/2023 1703  Subjective: No new complaints  Examination:  General exam: Appears calm and comfortable  Respiratory system: Clear to auscultation. Respiratory effort normal. Cardiovascular system: S1 & S2 heard, RRR. No JVD, murmurs, rubs, gallops or clicks. No pedal edema. Gastrointestinal system: Abdomen is nondistended, soft and nontender. No organomegaly or masses felt. Normal bowel sounds heard. Central nervous system: Alert and oriented. No focal neurological deficits. Extremities: Symmetric 5 x 5 power. Skin: No rashes, lesions or ulcers Psychiatry: Judgement and insight appear normal. Mood & affect appropriate.            Wound 10/15/23 0900 Pressure  Injury Sacrum Mid Stage  2 -  Partial thickness loss of dermis presenting as a shallow open injury with a red, pink wound bed without slough. (Active)     Diet Orders (From admission, onward)     Start     Ordered   11/23/23 1202  Diet 2 gram sodium Room service appropriate? Yes with Assist; Fluid consistency: Thin; Fluid restriction: 1500 mL Fluid  Diet effective now       Question Answer Comment  Room service appropriate? Yes with Assist   Fluid consistency: Thin   Fluid restriction: 1500 mL Fluid      11/23/23 1202            Objective: Vitals:   01/23/24 0811 01/23/24 1617 01/24/24 0109 01/24/24 0822  BP: 124/70 125/77 129/72 122/66  Pulse: 98 87 92 98  Resp: 18 18 18 20   Temp: 97.9 F (36.6 C) 98 F (36.7 C) (!) 97.5 F (36.4 C) 98.4 F (36.9 C)  TempSrc: Oral Oral Oral Oral  SpO2: 100% 100% 100% 100%  Weight:      Height:        Intake/Output Summary (Last 24 hours) at 01/24/2024 1110 Last data filed at 01/24/2024 0400 Gross per 24 hour  Intake 720 ml  Output --  Net 720 ml   Filed Weights   01/18/24 0500 01/19/24 0712 01/22/24 0354  Weight: 65 kg 59.2 kg 61.7 kg    Scheduled Meds:  sodium chloride    Intravenous Once   calcium  carbonate  1 tablet Oral TID WC   feeding supplement  237 mL Oral TID BM   folic acid   1 mg Oral Daily   furosemide   40 mg Intravenous BID   gabapentin   600 mg Oral TID   lactulose   30 g Oral TID   lidocaine   2 patch Transdermal Q24H   lidocaine -EPINEPHrine   20 mL Intradermal Once   magnesium  oxide  400 mg Oral Daily   magnesium  oxide  800 mg Oral Once   midodrine   20 mg Oral TID WC   multivitamin with minerals  1 tablet Oral Daily   mupirocin  cream   Topical BID   nutrition supplement (JUVEN)  1 packet Oral BID BM   pantoprazole   40 mg Oral BID   potassium chloride   20 mEq Oral Daily   potassium chloride   40 mEq Oral Once   QUEtiapine   25 mg Oral QHS   spironolactone   50 mg Oral Daily   thiamine   100 mg Oral Daily   triamcinolone  0.1 % cream  : eucerin   Topical Daily   Continuous Infusions:  Nutritional status Signs/Symptoms: severe fat depletion, severe muscle depletion Interventions: Ensure Enlive (each supplement provides 350kcal and 20 grams of protein), MVI, Liberalize Diet, Magic cup Body mass index is 24.89 kg/m.  Data Reviewed:   CBC: Recent Labs  Lab 01/20/24 0152 01/22/24 1133  WBC 4.1 4.6  NEUTROABS 2.9  --   HGB 8.7* 8.9*  HCT 25.6* 26.9*  MCV 87.1 87.3  PLT 154 162   Basic Metabolic Panel: Recent Labs  Lab 01/20/24 0152 01/22/24 1133  NA 133* 132*  K 2.9* 3.4*  CL 97* 95*  CO2 25 30  GLUCOSE 77 101*  BUN 16 15  CREATININE 0.61 0.60  CALCIUM  8.1* 8.3*  MG  --  1.6*  PHOS  --  4.1   GFR: Estimated Creatinine Clearance: 69.4 mL/min (by C-G formula based on SCr of 0.6 mg/dL). Liver Function Tests: Recent  Labs  Lab 01/20/24 0152  AST 52*  ALT 22  ALKPHOS 116  BILITOT 0.7  PROT 6.5  ALBUMIN  2.3*   No results for input(s): LIPASE, AMYLASE in the last 168 hours. No results for input(s): AMMONIA in the last 168 hours. Coagulation Profile: No results for input(s): INR, PROTIME in the last 168 hours. Cardiac Enzymes: No results for input(s): CKTOTAL, CKMB, CKMBINDEX, TROPONINI in the last 168 hours. BNP (last 3 results) No results for input(s): PROBNP in the last 8760 hours. HbA1C: No results for input(s): HGBA1C in the last 72 hours. CBG: No results for input(s): GLUCAP in the last 168 hours. Lipid Profile: No results for input(s): CHOL, HDL, LDLCALC, TRIG, CHOLHDL, LDLDIRECT in the last 72 hours. Thyroid  Function Tests: No results for input(s): TSH, T4TOTAL, FREET4, T3FREE, THYROIDAB in the last 72 hours. Anemia Panel: No results for input(s): VITAMINB12, FOLATE, FERRITIN, TIBC, IRON, RETICCTPCT in the last 72 hours. Sepsis Labs: No results for input(s): PROCALCITON, LATICACIDVEN in the last 168 hours.  No results  found for this or any previous visit (from the past 240 hours).       Radiology Studies: CT ABDOMEN PELVIS W CONTRAST Result Date: 01/22/2024 CLINICAL DATA:  Abdominal pain. EXAM: CT ABDOMEN AND PELVIS WITH CONTRAST TECHNIQUE: Multidetector CT imaging of the abdomen and pelvis was performed using the standard protocol following bolus administration of intravenous contrast. RADIATION DOSE REDUCTION: This exam was performed according to the departmental dose-optimization program which includes automated exposure control, adjustment of the mA and/or kV according to patient size and/or use of iterative reconstruction technique. CONTRAST:  75mL OMNIPAQUE  IOHEXOL  350 MG/ML SOLN COMPARISON:  Pelvic CT dated 12/18/2023. FINDINGS: Lower chest: The visualized lung bases are clear. No intra-abdominal free air.  Small ascites. Hepatobiliary: Cirrhosis. No biliary dilatation. Subcentimeter hypodense lesion in the right lobe of the liver is not characterized on this CT. Further evaluation with ultrasound on a nonemergent/outpatient basis recommended. The gallbladder is unremarkable. Pancreas: Unremarkable. No pancreatic ductal dilatation or surrounding inflammatory changes. Spleen: Normal in size without focal abnormality. Adrenals/Urinary Tract: The adrenal glands unremarkable. There is no hydronephrosis on either side. The urinary bladder is grossly unremarkable. Stomach/Bowel: Evaluation of the bowel is limited in the absence of oral contrast. No bowel obstruction. Vascular/Lymphatic: Mild aortoiliac atherosclerotic disease. The IVC is unremarkable. No portal venous gas. There is no adenopathy. Reproductive: The uterus is anteverted. No suspicious adnexal masses. Other: Diffuse subcutaneous edema and anasarca. Musculoskeletal: Osteopenia with degenerative changes. Insufficiency fracture of the right, and possibly left, sacral ala. IMPRESSION: 1. Cirrhosis with small ascites. 2. No bowel obstruction. 3. Insufficiency  fracture of the right, and possibly left, sacral ala. 4.  Aortic Atherosclerosis (ICD10-I70.0). Electronically Signed   By: Vanetta Chou M.D.   On: 01/22/2024 17:51           LOS: 113 days   Time spent= 35 mins    Burgess JAYSON Dare, MD [facility]  If 7PM-7AM, please contact night-coverage  01/24/2024, 11:10 AM  "

## 2024-01-24 NOTE — Plan of Care (Signed)
   Problem: Health Behavior/Discharge Planning: Goal: Ability to manage health-related needs will improve Outcome: Progressing   Problem: Clinical Measurements: Goal: Ability to maintain clinical measurements within normal limits will improve Outcome: Progressing   Problem: Activity: Goal: Risk for activity intolerance will decrease Outcome: Progressing   Problem: Nutrition: Goal: Adequate nutrition will be maintained Outcome: Progressing   Problem: Coping: Goal: Level of anxiety will decrease Outcome: Progressing   Problem: Elimination: Goal: Will not experience complications related to bowel motility Outcome: Progressing

## 2024-01-24 NOTE — Plan of Care (Signed)
   Problem: Activity: Goal: Risk for activity intolerance will decrease Outcome: Progressing   Problem: Nutrition: Goal: Adequate nutrition will be maintained Outcome: Progressing   Problem: Pain Managment: Goal: General experience of comfort will improve and/or be controlled Outcome: Progressing
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
## 2024-01-25 DIAGNOSIS — K7290 Hepatic failure, unspecified without coma: Secondary | ICD-10-CM | POA: Diagnosis not present

## 2024-01-25 DIAGNOSIS — K7460 Unspecified cirrhosis of liver: Secondary | ICD-10-CM | POA: Diagnosis not present

## 2024-01-25 MED ORDER — ACETAMINOPHEN 325 MG PO TABS
650.0000 mg | ORAL_TABLET | Freq: Three times a day (TID) | ORAL | Status: AC | PRN
  Administered 2024-02-05 – 2024-02-15 (×4): 650 mg via ORAL
  Filled 2024-01-25 (×5): qty 2

## 2024-01-25 NOTE — Plan of Care (Signed)
  Problem: Health Behavior/Discharge Planning: Goal: Ability to manage health-related needs will improve Outcome: Progressing   Problem: Activity: Goal: Risk for activity intolerance will decrease Outcome: Progressing   Problem: Nutrition: Goal: Adequate nutrition will be maintained Outcome: Progressing   Problem: Coping: Goal: Level of anxiety will decrease Outcome: Progressing   Problem: Pain Managment: Goal: General experience of comfort will improve and/or be controlled Outcome: Progressing   Problem: Safety: Goal: Ability to remain free from injury will improve Outcome: Progressing

## 2024-01-25 NOTE — TOC Progression Note (Signed)
 Transition of Care [facility] - Progression Note    Patient Details  Name: CRAIG WISNEWSKI MRN: [id number] Date of Birth: 08/11/1970  [facility] CM/SW Contact  [name], [name] Phone Number: 01/25/2024, 2:13 PM  Clinical Narrative:   CSW contacted patient's son, [name], to discuss patient saying that her daughter has agreed to take her home. Per [name], his sister has not discussed with him about taking the patient home with her, and he does not believe that is an option, but provided another number for CSW to reach out to [facility]. ([phone]). CSW attempted to reach [facility], sent a text message asking for a call back.  CSW met with patient and friend, Lacinda, at bedside to discuss. Patient continues to state that her daughter said she can take her home, and patient discussed feeling frustrated that she's still in the hospital and can't find anywhere to go. CSW answered questions from patient and friend on the barriers to finding placement and difficulties related to patient's insurance and care needs. Friend indicated understanding and expressed appreciation for CSW working to find something for the patient.   Friend met with CSW outside the room to discuss concerns that he would have about her going home instead of a facility, including that her medications won't be managed because the patient can't do them on her own. CSW indicated understanding. CSW to continue to follow.    Expected Discharge Plan: Skilled Nursing Facility Barriers to Discharge: Homeless with medical needs, Inadequate or no insurance, Continued Medical Work up, Unsafe home situation               Expected Discharge Plan and Services In-house Referral: Clinical Social Work Discharge Planning Services: CM Consult Post Acute Care Choice: Home Health Living arrangements for the past 2 months: Single Family Home                 DME Arranged: Walker rolling   Date DME Agency Contacted: 10/08/23    Representative spoke with at DME Agency: [name]             Social Drivers of Health (SDOH) Interventions SDOH Screenings   Food Insecurity: No Food Insecurity (10/03/2023)  Housing: Low Risk (10/03/2023)  Transportation Needs: No Transportation Needs (10/03/2023)  Recent Concern: Transportation Needs - Unmet Transportation Needs (08/20/2023)  Utilities: Not At Risk (10/03/2023)  Depression (PHQ2-9): High Risk (07/30/2023)  Social Connections: Socially Isolated (10/03/2023)  Tobacco Use: High Risk (10/04/2023)    Readmission Risk Interventions    10/04/2023    9:02 AM 09/03/2023   12:31 PM 09/02/2023    9:01 AM  Readmission Risk Prevention Plan  Transportation Screening Complete Complete Complete  PCP or Specialist Appt within 3-5 Days   Complete  HRI or Home Care Consult   Complete  Social Work Consult for Recovery Care Planning/Counseling   Complete  Palliative Care Screening   Not Applicable  Medication Review Oceanographer) Complete Complete Complete  HRI or Home Care Consult Complete Complete   SW Recovery Care/Counseling Consult Complete Complete   Palliative Care Screening Not Applicable Not Applicable   Skilled Nursing Facility Not Applicable Patient Refused
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
## 2024-01-25 NOTE — Progress Notes (Signed)
 Upon entering room, RN observed patient in bathroom alone. RN escorted patient back to bed. RN also observed patient turning of bed alarm to silence alert. Patient is high fall risk and continues to be noncompliant with safety precautions including turning off bed alarm when patient wants to ambulate. RN and other nursing staff continues to educate patient on dangers of falling and risk of injury. Patient verbally agrees and understands but still continues to be noncompliant.

## 2024-01-25 NOTE — Plan of Care (Signed)

## 2024-01-25 NOTE — Progress Notes (Signed)
 " PROGRESS NOTE    Joan Wood  FMW:[id number] DOB: 1970-04-25 DOA: 10/03/2023 PCP: Edman Meade PEDLAR, FNP    Brief Narrative:  54 year old with history of decompensated liver cirrhosis, PUD, gastric ulcer perforation requiring exploratory laparotomy, GERD, pancreatitis, OSA admitted initially for coffee-ground emesis and bloody diarrhea.  Consulted GI underwent endoscopy which revealed nonbleeding ulcer and esophagitis and some concerns of esophageal necrosis.  This was treated by GI team and IR had also been consulted.  Hospital course also complicated by hepatic encephalopathy requiring lactulose  and rifaximin .   Assessment & Plan:  Decompensated alcoholic liver cirrhosis Hepatic encephalopathy Recurrent ascites -Earlier in the admission patient was seen by gastroenterology and had undergone extensive workup including endoscopy, received NAC protocol for 5 days.  She reports also been getting Lasix  and rifaximin  along with PPI.  Nothing further to add from gastroenterology standpoint besides outpatient follow-up therefore their service had signed off. -IR is performing periodic paracentesis  Cold sores  - improved with acyclovir    Left eye stye No associated erythema or eye pain - completed erythromycin  ointments   Left torso folliculitis/boil - Ordered topical mupirocin    AKI -Secondary to ATN from contrast, renal function back to baseline   Bilateral sacral insufficiency fracture - Seen on the CT scan.  Supportive care, pain control   Appendicitis  -Earlier during the admission, general surgery recommended conservative management General surgery consulted unfortunately not a candidate for surgical management due to high risk.  Patient was recommended conservative management with antibiotics, completed the antibiotic regimen. No further abdominal pain    Hypokalemia Hypomagnesemia - Replete as needed   Chronic hyponatremia  -Secondary to underlying cirrhosis   ACute  upper GI bleed S/p a total of 4 units of PRBC transfusion. -By GI had undergone endoscopy earlier in admission.  Continue PPI    Lower extremity edema -Secondary to hypoalbuminemia from liver disease   Anxiety -Continue with hydroxyzine    History of tobacco abuse -Refusing nicotine  patch   Chronic pain syndrome - cymbalta  relatively contraindicated with her liver disease.   Severe malnutrition Dietitian recommendations (11/11): - Continue with 1.2 L fluid restriction. - Continue Magic Cup berry and choc BID. Each supplement provides 290 Kcals and 9 grams of protein. - Continue Ensure+HP TID. Each supplement provides 350 kcal and 20 grams of protein. - Continue daily multivitamin PO, 100 mg thiamine  PO daily, and 1 mg folic acid  PO daily. - Continue Juven BID for wound healing.   Pressure injury -Mid sacrum. Unclear if present on admission.    Goals of care Patient has been seen by palliative care and continues to desire full scope medical care.    DVT prophylaxis: SCDs Start: 10/03/23 2153    Code Status: Full Code Family Communication:   Status is: Inpatient Remains inpatient appropriate because: Placement   PT Follow up Recs: Skilled Nursing-Short Term Rehab (<3 Hours/Day)12/07/2023 1703  Subjective: No new complaints  Examination:  General exam: Appears calm and comfortable  Respiratory system: Clear to auscultation. Respiratory effort normal. Cardiovascular system: S1 & S2 heard, RRR. No JVD, murmurs, rubs, gallops or clicks. No pedal edema. Gastrointestinal system: Abdomen is nondistended, soft and nontender. No organomegaly or masses felt. Normal bowel sounds heard. Central nervous system: Alert and oriented. No focal neurological deficits. Extremities: Symmetric 5 x 5 power. Skin: No rashes, lesions or ulcers Psychiatry: Judgement and insight appear normal. Mood & affect appropriate.  Diet Orders (From admission, onward)      Start     Ordered   11/23/23 1202  Diet 2 gram sodium Room service appropriate? Yes with Assist; Fluid consistency: Thin; Fluid restriction: 1500 mL Fluid  Diet effective now       Question Answer Comment  Room service appropriate? Yes with Assist   Fluid consistency: Thin   Fluid restriction: 1500 mL Fluid      11/23/23 1202            Objective: Vitals:   01/24/24 0822 01/24/24 1601 01/24/24 1959 01/25/24 0944  BP: 122/66 114/68 125/71 (!) 102/59  Pulse: 98 88 87 80  Resp: 20 16  16   Temp: 98.4 F (36.9 C) 98.6 F (37 C) 99.1 F (37.3 C) 98.2 F (36.8 C)  TempSrc: Oral Oral Oral Oral  SpO2: 100% 99% 99% 100%  Weight:      Height:        Intake/Output Summary (Last 24 hours) at 01/25/2024 1126 Last data filed at 01/25/2024 0830 Gross per 24 hour  Intake 2034 ml  Output --  Net 2034 ml   Filed Weights   01/18/24 0500 01/19/24 0712 01/22/24 0354  Weight: 65 kg 59.2 kg 61.7 kg    Scheduled Meds:  sodium chloride    Intravenous Once   calcium  carbonate  1 tablet Oral TID WC   feeding supplement  237 mL Oral TID BM   folic acid   1 mg Oral Daily   furosemide   40 mg Intravenous BID   gabapentin   600 mg Oral TID   lactulose   30 g Oral TID   lidocaine   2 patch Transdermal Q24H   lidocaine -EPINEPHrine   20 mL Intradermal Once   magnesium  oxide  400 mg Oral Daily   midodrine   20 mg Oral TID WC   multivitamin with minerals  1 tablet Oral Daily   mupirocin  cream   Topical BID   nutrition supplement (JUVEN)  1 packet Oral BID BM   pantoprazole   40 mg Oral BID   potassium chloride   20 mEq Oral Daily   QUEtiapine   25 mg Oral QHS   spironolactone   50 mg Oral Daily   thiamine   100 mg Oral Daily   triamcinolone  0.1 % cream : eucerin   Topical Daily   Continuous Infusions:  Nutritional status Signs/Symptoms: severe fat depletion, severe muscle depletion Interventions: Ensure Enlive (each supplement provides 350kcal and 20 grams of protein), MVI, Liberalize Diet, Magic  cup Body mass index is 24.89 kg/m.  Data Reviewed:   CBC: Recent Labs  Lab 01/20/24 0152 01/22/24 1133  WBC 4.1 4.6  NEUTROABS 2.9  --   HGB 8.7* 8.9*  HCT 25.6* 26.9*  MCV 87.1 87.3  PLT 154 162   Basic Metabolic Panel: Recent Labs  Lab 01/20/24 0152 01/22/24 1133  NA 133* 132*  K 2.9* 3.4*  CL 97* 95*  CO2 25 30  GLUCOSE 77 101*  BUN 16 15  CREATININE 0.61 0.60  CALCIUM  8.1* 8.3*  MG  --  1.6*  PHOS  --  4.1   GFR: Estimated Creatinine Clearance: 69.4 mL/min (by C-G formula based on SCr of 0.6 mg/dL). Liver Function Tests: Recent Labs  Lab 01/20/24 0152  AST 52*  ALT 22  ALKPHOS 116  BILITOT 0.7  PROT 6.5  ALBUMIN  2.3*   No results for input(s): LIPASE, AMYLASE in the last 168 hours. No results for input(s): AMMONIA in the last 168 hours. Coagulation Profile: No  results for input(s): INR, PROTIME in the last 168 hours. Cardiac Enzymes: No results for input(s): CKTOTAL, CKMB, CKMBINDEX, TROPONINI in the last 168 hours. BNP (last 3 results) No results for input(s): PROBNP in the last 8760 hours. HbA1C: No results for input(s): HGBA1C in the last 72 hours. CBG: No results for input(s): GLUCAP in the last 168 hours. Lipid Profile: No results for input(s): CHOL, HDL, LDLCALC, TRIG, CHOLHDL, LDLDIRECT in the last 72 hours. Thyroid  Function Tests: No results for input(s): TSH, T4TOTAL, FREET4, T3FREE, THYROIDAB in the last 72 hours. Anemia Panel: No results for input(s): VITAMINB12, FOLATE, FERRITIN, TIBC, IRON, RETICCTPCT in the last 72 hours. Sepsis Labs: No results for input(s): PROCALCITON, LATICACIDVEN in the last 168 hours.  No results found for this or any previous visit (from the past 240 hours).       Radiology Studies: No results found.         LOS: 114 days   Time spent= 35 mins    Burgess JAYSON Dare, MD [facility]  If 7PM-7AM, please contact  night-coverage  01/25/2024, 11:26 AM  "

## 2024-01-25 NOTE — Progress Notes (Signed)
 Pt turned off bed alarm and was found walking halls by herself with a walker. Pt informed she cannot walk by herself to which she stated I know, I need an escort. This RN then escorted pt around unit and back to her room. Pt previously had been compliant calling for assistance. Pt stated she was restless because she had been asleep from 8-12:30 am. This RN informed pt she had not been asleep yet this evening as she has utilized the call bell consistently every 5 to 10 minutes since 7pm with extremely frequent requests. Pt forgetful and then stated she remembers certain activities and conversations we had engaged in. Pt asking for her schedule of medications and when they were available. Pt has small notebook to which she wrote down her medication times to keep track.  Pt appears to have a short attention span as a particular activity does not keep her engaged long, pt unable to stay in one position very long and is constantly restless.

## 2024-01-26 DIAGNOSIS — K7290 Hepatic failure, unspecified without coma: Secondary | ICD-10-CM | POA: Diagnosis not present

## 2024-01-26 DIAGNOSIS — K7460 Unspecified cirrhosis of liver: Secondary | ICD-10-CM | POA: Diagnosis not present

## 2024-01-26 NOTE — Progress Notes (Addendum)
 RN responded to bed exist and noted that patient already gotten to the bathroom. RN explained the fall bundle and safety precaution with Joan Wood, patient verbalized that she understand and still requesting for her safety alarm to be turn off. She is alert and oriented, denied any distress at this time. MD notified for possible PT reeval. Charge nurse updated.

## 2024-01-26 NOTE — Progress Notes (Signed)
 " PROGRESS NOTE    Joan LEMMONS  FMW:[id number] DOB: 1970/07/15 DOA: 10/03/2023 PCP: Edman Meade PEDLAR, FNP    Brief Narrative:  54 year old with history of decompensated liver cirrhosis, PUD, gastric ulcer perforation requiring exploratory laparotomy, GERD, pancreatitis, OSA admitted initially for coffee-ground emesis and bloody diarrhea.  Consulted GI underwent endoscopy which revealed nonbleeding ulcer and esophagitis and some concerns of esophageal necrosis.  This was treated by GI team and IR had also been consulted.  Hospital course also complicated by hepatic encephalopathy requiring lactulose  and rifaximin .  Intermittently that she requires paracentesis  Ongoing safe disposition at this time   Assessment & Plan:  Decompensated alcoholic liver cirrhosis Hepatic encephalopathy Recurrent ascites -Earlier in the admission patient was seen by gastroenterology and had undergone extensive workup including endoscopy, received NAC protocol for 5 days.  She reports also been getting Lasix  and rifaximin  along with PPI.  Nothing further to add from gastroenterology standpoint besides outpatient follow-up therefore their service had signed off. -IR is performing periodic paracentesis  Cold sores  - improved with acyclovir    Left eye stye No associated erythema or eye pain - completed erythromycin  ointments   Left torso folliculitis/boil - Ordered topical mupirocin    AKI -Secondary to ATN from contrast, renal function back to baseline   Bilateral sacral insufficiency fracture - Seen on the CT scan.  Supportive care, pain control   Appendicitis  -Earlier during the admission, general surgery recommended conservative management General surgery consulted unfortunately not a candidate for surgical management due to high risk.  Patient was recommended conservative management with antibiotics, completed the antibiotic regimen. No further abdominal pain    Hypokalemia Hypomagnesemia -  Replete as needed   Chronic hyponatremia  -Secondary to underlying cirrhosis   ACute upper GI bleed S/p a total of 4 units of PRBC transfusion. -By GI had undergone endoscopy earlier in admission.  Continue PPI    Lower extremity edema -Secondary to hypoalbuminemia from liver disease   Anxiety -Continue with hydroxyzine    History of tobacco abuse -Refusing nicotine  patch   Chronic pain syndrome - cymbalta  relatively contraindicated with her liver disease.   Severe malnutrition Dietitian recommendations (11/11): - Continue with 1.2 L fluid restriction. - Continue Magic Cup berry and choc BID. Each supplement provides 290 Kcals and 9 grams of protein. - Continue Ensure+HP TID. Each supplement provides 350 kcal and 20 grams of protein. - Continue daily multivitamin PO, 100 mg thiamine  PO daily, and 1 mg folic acid  PO daily. - Continue Juven BID for wound healing.   Pressure injury -Mid sacrum. Unclear if present on admission.    Goals of care Patient has been seen by palliative care and continues to desire full scope medical care.   PT to reassess  DVT prophylaxis: SCDs Start: 10/03/23 2153    Code Status: Full Code Family Communication:   Status is: Inpatient Remains inpatient appropriate because: Placement   PT Follow up Recs: Skilled Nursing-Short Term Rehab (<3 Hours/Day)12/07/2023 1703  Subjective: Keeps on wanting to get up and move around Does not have any complaints  Examination:  General exam: Appears calm and comfortable  Respiratory system: Clear to auscultation. Respiratory effort normal. Cardiovascular system: S1 & S2 heard, RRR. No JVD, murmurs, rubs, gallops or clicks. No pedal edema. Gastrointestinal system: Abdomen is nondistended, soft and nontender. No organomegaly or masses felt. Normal bowel sounds heard. Central nervous system: Alert and oriented. No focal neurological deficits. Extremities: Symmetric 5 x 5 power. Skin: No rashes, lesions  or ulcers Psychiatry: Judgement and insight appear normal. Mood & affect appropriate.                Diet Orders (From admission, onward)     Start     Ordered   11/23/23 1202  Diet 2 gram sodium Room service appropriate? Yes with Assist; Fluid consistency: Thin; Fluid restriction: 1500 mL Fluid  Diet effective now       Question Answer Comment  Room service appropriate? Yes with Assist   Fluid consistency: Thin   Fluid restriction: 1500 mL Fluid      11/23/23 1202            Objective: Vitals:   01/25/24 1528 01/25/24 2321 01/26/24 0500 01/26/24 0720  BP: 116/60 109/65  (!) 106/59  Pulse: 92 84  86  Resp: 18 16  16   Temp: 98.7 F (37.1 C) 98.7 F (37.1 C)  98.6 F (37 C)  TempSrc:  Oral    SpO2: 95% 97%  95%  Weight:   58.7 kg   Height:        Intake/Output Summary (Last 24 hours) at 01/26/2024 1116 Last data filed at 01/26/2024 0900 Gross per 24 hour  Intake 480 ml  Output --  Net 480 ml   Filed Weights   01/19/24 0712 01/22/24 0354 01/26/24 0500  Weight: 59.2 kg 61.7 kg 58.7 kg    Scheduled Meds:  sodium chloride    Intravenous Once   calcium  carbonate  1 tablet Oral TID WC   feeding supplement  237 mL Oral TID BM   folic acid   1 mg Oral Daily   furosemide   40 mg Intravenous BID   gabapentin   600 mg Oral TID   lactulose   30 g Oral TID   lidocaine   2 patch Transdermal Q24H   lidocaine -EPINEPHrine   20 mL Intradermal Once   magnesium  oxide  400 mg Oral Daily   midodrine   20 mg Oral TID WC   multivitamin with minerals  1 tablet Oral Daily   mupirocin  cream   Topical BID   nutrition supplement (JUVEN)  1 packet Oral BID BM   pantoprazole   40 mg Oral BID   potassium chloride   20 mEq Oral Daily   QUEtiapine   25 mg Oral QHS   spironolactone   50 mg Oral Daily   thiamine   100 mg Oral Daily   triamcinolone  0.1 % cream : eucerin   Topical Daily   Continuous Infusions:  Nutritional status Signs/Symptoms: severe fat depletion, severe muscle  depletion Interventions: Ensure Enlive (each supplement provides 350kcal and 20 grams of protein), MVI, Liberalize Diet, Magic cup Body mass index is 23.67 kg/m.  Data Reviewed:   CBC: Recent Labs  Lab 01/20/24 0152 01/22/24 1133  WBC 4.1 4.6  NEUTROABS 2.9  --   HGB 8.7* 8.9*  HCT 25.6* 26.9*  MCV 87.1 87.3  PLT 154 162   Basic Metabolic Panel: Recent Labs  Lab 01/20/24 0152 01/22/24 1133  NA 133* 132*  K 2.9* 3.4*  CL 97* 95*  CO2 25 30  GLUCOSE 77 101*  BUN 16 15  CREATININE 0.61 0.60  CALCIUM  8.1* 8.3*  MG  --  1.6*  PHOS  --  4.1   GFR: Estimated Creatinine Clearance: 63.6 mL/min (by C-G formula based on SCr of 0.6 mg/dL). Liver Function Tests: Recent Labs  Lab 01/20/24 0152  AST 52*  ALT 22  ALKPHOS 116  BILITOT 0.7  PROT 6.5  ALBUMIN  2.3*  No results for input(s): LIPASE, AMYLASE in the last 168 hours. No results for input(s): AMMONIA in the last 168 hours. Coagulation Profile: No results for input(s): INR, PROTIME in the last 168 hours. Cardiac Enzymes: No results for input(s): CKTOTAL, CKMB, CKMBINDEX, TROPONINI in the last 168 hours. BNP (last 3 results) No results for input(s): PROBNP in the last 8760 hours. HbA1C: No results for input(s): HGBA1C in the last 72 hours. CBG: No results for input(s): GLUCAP in the last 168 hours. Lipid Profile: No results for input(s): CHOL, HDL, LDLCALC, TRIG, CHOLHDL, LDLDIRECT in the last 72 hours. Thyroid  Function Tests: No results for input(s): TSH, T4TOTAL, FREET4, T3FREE, THYROIDAB in the last 72 hours. Anemia Panel: No results for input(s): VITAMINB12, FOLATE, FERRITIN, TIBC, IRON, RETICCTPCT in the last 72 hours. Sepsis Labs: No results for input(s): PROCALCITON, LATICACIDVEN in the last 168 hours.  No results found for this or any previous visit (from the past 240 hours).       Radiology Studies: No results  found.         LOS: 115 days   Time spent= 35 mins    Burgess JAYSON Dare, MD [facility]  If 7PM-7AM, please contact night-coverage  01/26/2024, 11:16 AM  "

## 2024-01-26 NOTE — Plan of Care (Signed)
   Problem: Health Behavior/Discharge Planning: Goal: Ability to manage health-related needs will improve Outcome: Progressing

## 2024-01-27 DIAGNOSIS — K7290 Hepatic failure, unspecified without coma: Secondary | ICD-10-CM | POA: Diagnosis not present

## 2024-01-27 DIAGNOSIS — K7460 Unspecified cirrhosis of liver: Secondary | ICD-10-CM | POA: Diagnosis not present

## 2024-01-27 LAB — BASIC METABOLIC PANEL WITH GFR
Anion gap: 10 (ref 5–15)
BUN: 12 mg/dL (ref 6–20)
CO2: 27 mmol/L (ref 22–32)
Calcium: 8.1 mg/dL — ABNORMAL LOW (ref 8.9–10.3)
Chloride: 96 mmol/L — ABNORMAL LOW (ref 98–111)
Creatinine, Ser: 0.68 mg/dL (ref 0.44–1.00)
GFR, Estimated: >60 mL/min
Glucose, Bld: 75 mg/dL (ref 70–99)
Potassium: 3.3 mmol/L — ABNORMAL LOW (ref 3.5–5.1)
Sodium: 134 mmol/L — ABNORMAL LOW (ref 135–145)

## 2024-01-27 LAB — MAGNESIUM: Magnesium: 1.8 mg/dL (ref 1.7–2.4)

## 2024-01-27 MED ORDER — POTASSIUM CHLORIDE CRYS ER 20 MEQ PO TBCR
40.0000 meq | EXTENDED_RELEASE_TABLET | Freq: Once | ORAL | Status: AC
  Administered 2024-01-27: 40 meq via ORAL
  Filled 2024-01-27: qty 2

## 2024-01-27 MED ORDER — MAGNESIUM OXIDE -MG SUPPLEMENT 400 (240 MG) MG PO TABS
800.0000 mg | ORAL_TABLET | Freq: Once | ORAL | Status: AC
  Administered 2024-01-27: 800 mg via ORAL
  Filled 2024-01-27: qty 2

## 2024-01-27 NOTE — Progress Notes (Signed)
 " PROGRESS NOTE    Joan Wood  FMW:[id number] DOB: 04/07/1970 DOA: 10/03/2023 PCP: Edman Meade PEDLAR, FNP    Brief Narrative:  54 year old with history of decompensated liver cirrhosis, PUD, gastric ulcer perforation requiring exploratory laparotomy, GERD, pancreatitis, OSA admitted initially for coffee-ground emesis and bloody diarrhea.  Consulted GI underwent endoscopy which revealed nonbleeding ulcer and esophagitis and some concerns of esophageal necrosis.  This was treated by GI team and IR had also been consulted.  Hospital course also complicated by hepatic encephalopathy requiring lactulose  and rifaximin .  Intermittently that she requires paracentesis  Ongoing safe disposition at this time   Assessment & Plan:  Decompensated alcoholic liver cirrhosis Hepatic encephalopathy Recurrent ascites -Earlier in the admission patient was seen by gastroenterology and had undergone extensive workup including endoscopy, received NAC protocol for 5 days.  She reports also been getting Lasix  and rifaximin  along with PPI.  Nothing further to add from gastroenterology standpoint besides outpatient follow-up therefore their service had signed off. -IR is performing periodic paracentesis  Cold sores  - improved with acyclovir    Left eye stye No associated erythema or eye pain - completed erythromycin  ointments   Left torso folliculitis/boil - Ordered topical mupirocin    AKI -Secondary to ATN from contrast, renal function back to baseline   Bilateral sacral insufficiency fracture - Seen on the CT scan.  Supportive care, pain control   Appendicitis  -Earlier during the admission, general surgery recommended conservative management General surgery consulted unfortunately not a candidate for surgical management due to high risk.  Patient was recommended conservative management with antibiotics, completed the antibiotic regimen. No further abdominal pain    Hypokalemia Hypomagnesemia -  Replete as needed   Chronic hyponatremia  -Secondary to underlying cirrhosis   ACute upper GI bleed S/p a total of 4 units of PRBC transfusion. -By GI had undergone endoscopy earlier in admission.  Continue PPI    Lower extremity edema -Secondary to hypoalbuminemia from liver disease   Anxiety -Continue with hydroxyzine    History of tobacco abuse -Refusing nicotine  patch   Chronic pain syndrome - cymbalta  relatively contraindicated with her liver disease.   Severe malnutrition Dietitian recommendations (11/11): - Continue with 1.2 L fluid restriction. - Continue Magic Cup berry and choc BID. Each supplement provides 290 Kcals and 9 grams of protein. - Continue Ensure+HP TID. Each supplement provides 350 kcal and 20 grams of protein. - Continue daily multivitamin PO, 100 mg thiamine  PO daily, and 1 mg folic acid  PO daily. - Continue Juven BID for wound healing.   Pressure injury -Mid sacrum. Unclear if present on admission.    Goals of care Patient has been seen by palliative care and continues to desire full scope medical care.   PT to reassess  DVT prophylaxis: SCDs Start: 10/03/23 2153    Code Status: Full Code Family Communication:   Status is: Inpatient Remains inpatient appropriate because: Placement   PT Follow up Recs: Skilled Nursing-Short Term Rehab (<3 Hours/Day)12/07/2023 1703  Subjective: No new complaints, no acute events overnight  Examination:  General exam: Appears calm and comfortable  Respiratory system: Clear to auscultation. Respiratory effort normal. Cardiovascular system: S1 & S2 heard, RRR. No JVD, murmurs, rubs, gallops or clicks. No pedal edema. Gastrointestinal system: Abdomen is nondistended, soft and nontender. No organomegaly or masses felt. Normal bowel sounds heard. Central nervous system: Alert and oriented. No focal neurological deficits. Extremities: Symmetric 5 x 5 power. Skin: No rashes, lesions or ulcers Psychiatry:  Judgement and insight  appear normal. Mood & affect appropriate.                Diet Orders (From admission, onward)     Start     Ordered   11/23/23 1202  Diet 2 gram sodium Room service appropriate? Yes with Assist; Fluid consistency: Thin; Fluid restriction: 1500 mL Fluid  Diet effective now       Question Answer Comment  Room service appropriate? Yes with Assist   Fluid consistency: Thin   Fluid restriction: 1500 mL Fluid      11/23/23 1202            Objective: Vitals:   01/26/24 0500 01/26/24 0720 01/26/24 1522 01/26/24 1940  BP:  (!) 106/59 (!) 140/78 120/69  Pulse:  86 78 80  Resp:  16 16   Temp:  98.6 F (37 C) 98.3 F (36.8 C) 97.9 F (36.6 C)  TempSrc:    Oral  SpO2:  95% 99% 95%  Weight: 58.7 kg     Height:        Intake/Output Summary (Last 24 hours) at 01/27/2024 1022 Last data filed at 01/26/2024 2000 Gross per 24 hour  Intake 720 ml  Output --  Net 720 ml   Filed Weights   01/19/24 0712 01/22/24 0354 01/26/24 0500  Weight: 59.2 kg 61.7 kg 58.7 kg    Scheduled Meds:  sodium chloride    Intravenous Once   calcium  carbonate  1 tablet Oral TID WC   feeding supplement  237 mL Oral TID BM   folic acid   1 mg Oral Daily   furosemide   40 mg Intravenous BID   gabapentin   600 mg Oral TID   lactulose   30 g Oral TID   lidocaine   2 patch Transdermal Q24H   lidocaine -EPINEPHrine   20 mL Intradermal Once   magnesium  oxide  400 mg Oral Daily   magnesium  oxide  800 mg Oral Once   midodrine   20 mg Oral TID WC   multivitamin with minerals  1 tablet Oral Daily   mupirocin  cream   Topical BID   nutrition supplement (JUVEN)  1 packet Oral BID BM   pantoprazole   40 mg Oral BID   potassium chloride   20 mEq Oral Daily   potassium chloride   40 mEq Oral Once   QUEtiapine   25 mg Oral QHS   spironolactone   50 mg Oral Daily   thiamine   100 mg Oral Daily   triamcinolone  0.1 % cream : eucerin   Topical Daily   Continuous Infusions:  Nutritional  status Signs/Symptoms: severe fat depletion, severe muscle depletion Interventions: Ensure Enlive (each supplement provides 350kcal and 20 grams of protein), MVI, Liberalize Diet, Magic cup Body mass index is 23.67 kg/m.  Data Reviewed:   CBC: Recent Labs  Lab 01/22/24 1133  WBC 4.6  HGB 8.9*  HCT 26.9*  MCV 87.3  PLT 162   Basic Metabolic Panel: Recent Labs  Lab 01/22/24 1133 01/27/24 0425  NA 132* 134*  K 3.4* 3.3*  CL 95* 96*  CO2 30 27  GLUCOSE 101* 75  BUN 15 12  CREATININE 0.60 0.68  CALCIUM  8.3* 8.1*  MG 1.6* 1.8  PHOS 4.1  --    GFR: Estimated Creatinine Clearance: 63.6 mL/min (by C-G formula based on SCr of 0.68 mg/dL). Liver Function Tests: No results for input(s): AST, ALT, ALKPHOS, BILITOT, PROT, ALBUMIN  in the last 168 hours. No results for input(s): LIPASE, AMYLASE in the last 168 hours. No results  for input(s): AMMONIA in the last 168 hours. Coagulation Profile: No results for input(s): INR, PROTIME in the last 168 hours. Cardiac Enzymes: No results for input(s): CKTOTAL, CKMB, CKMBINDEX, TROPONINI in the last 168 hours. BNP (last 3 results) No results for input(s): PROBNP in the last 8760 hours. HbA1C: No results for input(s): HGBA1C in the last 72 hours. CBG: No results for input(s): GLUCAP in the last 168 hours. Lipid Profile: No results for input(s): CHOL, HDL, LDLCALC, TRIG, CHOLHDL, LDLDIRECT in the last 72 hours. Thyroid  Function Tests: No results for input(s): TSH, T4TOTAL, FREET4, T3FREE, THYROIDAB in the last 72 hours. Anemia Panel: No results for input(s): VITAMINB12, FOLATE, FERRITIN, TIBC, IRON, RETICCTPCT in the last 72 hours. Sepsis Labs: No results for input(s): PROCALCITON, LATICACIDVEN in the last 168 hours.  No results found for this or any previous visit (from the past 240 hours).       Radiology Studies: No results found.         LOS:  116 days   Time spent= 35 mins    Burgess JAYSON Dare, MD [facility]  If 7PM-7AM, please contact night-coverage  01/27/2024, 10:22 AM  "

## 2024-01-27 NOTE — Evaluation (Signed)
 Occupational Therapy Evaluation & Discharge Patient Details Name: Joan Wood MRN: [id number] DOB: 1970/08/07 Today's Date: 01/27/2024   History of Present Illness   54 y.o. female admitted 10/03/2023 with nausea/vomiting and bloody diarrhea. Workup for decompensated alcoholic cirrhosis, ascites, hepatic encephalopathy. S/p upper EGD 9/25 concerning for possible acute esophageal necrosis, portal hypertension, non-bleeding gastric and duodenal ulcers. S/p recurrent paracentesis for recurrent ascites; IR deferred PleurX catheter placement due to social situation. PMH includes decompensated cirrhosis, PUD, GERD, pancreatitis, OSA, peripheral neuropathy, tobacco abuse, alcohol  abuse, severe protein malnutrition.     Clinical Impressions Joan Wood was evaluated s/p the above admission list. Pt has had a prolonged acute stay, she is indep at baseline and is progressing back towards her indep baseline acutely. Upon evaluation, pt demonstrated mod I ability to complete mobility and ADLs with RW. Pt remains tangential and needs cues for re-direction and cognition, however anticipate this is close to baseline. She was observed tidying up room throughout session, ambulating in the halls and completing ADLs without safety concern. Pt does not require further acute, or follow up OT services. OT to sign off with appreciation of order, please re-consult if needed.   Of note, pt now reports she plans to stay at her daughters home at discharge. OT recommends 24/7 superivsion A and assist with medication management.      If plan is discharge home, recommend the following:   Assistance with cooking/housework;Assist for transportation;Supervision due to cognitive status;Direct supervision/assist for financial management;Direct supervision/assist for medications management     Functional Status Assessment   Patient has had a recent decline in their functional status and demonstrates the ability to make significant  improvements in function in a reasonable and predictable amount of time.     Equipment Recommendations   None recommended by OT      Precautions/Restrictions   Precautions Precautions: Fall Recall of Precautions/Restrictions: Impaired Restrictions Weight Bearing Restrictions Per Provider Order: No     Mobility Bed Mobility Overal bed mobility: Independent                  Transfers Overall transfer level: Modified independent Equipment used: Rolling walker (2 wheels)               General transfer comment: pt completed several STS throughout session, she was picking items off of the floor and cleaning room throughout. mod I with RW. Also observed mobilizign in the hallway with RW several times.      Balance Overall balance assessment: Needs assistance Sitting-balance support: Feet supported Sitting balance-Leahy Scale: Good     Standing balance support: No upper extremity supported, During functional activity Standing balance-Leahy Scale: Fair Standing balance comment: statically                           ADL either performed or assessed with clinical judgement   ADL Overall ADL's : Modified independent             General ADL Comments: with RW for increased safety. Pt was OOB grooming at the sink on my arrival     Vision Baseline Vision/History: 0 No visual deficits Vision Assessment?: Wears glasses for reading;Wears glasses for driving     Perception Perception: Within Functional Limits       Praxis Praxis: WFL       Pertinent Vitals/Pain Pain Assessment Pain Assessment: No/denies pain     Extremity/Trunk Assessment Upper Extremity Assessment Upper Extremity Assessment: Generalized weakness;Overall  WFL for tasks assessed   Lower Extremity Assessment Lower Extremity Assessment: Defer to PT evaluation   Cervical / Trunk Assessment Cervical / Trunk Assessment: Kyphotic   Communication Communication Communication:  No apparent difficulties   Cognition Arousal: Alert Behavior During Therapy: WFL for tasks assessed/performed Cognition: Cognition impaired   Orientation impairments: Situation Awareness: Online awareness impaired Memory impairment (select all impairments): Non-declarative long-term memory, Working memory Attention impairment (select first level of impairment): Alternating attention Executive functioning impairment (select all impairments): Reasoning OT - Cognition Comments: tangential, needs cues for re-direciton. likely baseline. otherwise [facility] for basic ADLs and mobility. monitoring her own fluid intake with a sheet on the door                 Following commands: Impaired Following commands impaired: Follows multi-step commands with increased time (sle-distracting)     Cueing  General Comments   Cueing Techniques: Verbal cues;Visual cues  VSS    Home Living Family/patient expects to be discharged to:: Private residence Living Arrangements: Non-relatives/Friends Available Help at Discharge: Friend(s);Available PRN/intermittently Type of Home: House Home Access: Stairs to enter [employer] of Steps: 5 Entrance Stairs-Rails: Right Home Layout: One level     Bathroom Shower/Tub: Chief Strategy Officer: Standard Bathroom Accessibility: No   Home Equipment: Cane - single point;Wheelchair - Sport And Exercise Psychologist Comments: now stating she will go live her daughter and grandchildren at discharge - undure of home set up      Prior Functioning/Environment Prior Level of Function : Independent/Modified Independent             Mobility Comments: pt reports independence in mobility until the last 4-6 weeks. since she has been mobilizing for very short household distances and requiring support of her [facility] or furniture - 01/27/24 pt has been mobilizing in her room and on the unit with RW, no assist ADLs Comments: pt reports independence at  baseline but has been having great difficulty with ADLs in the last 4-6 weeks. Sometimes the pt has been going multiple days without bathing or changing clothes. reports can manage microwave meals - 1/18/296 pt has been managing ADLs acutely without assist    OT Problem List: Decreased strength;Decreased activity tolerance;Impaired balance (sitting and/or standing);Pain;Decreased knowledge of use of DME or AE;Decreased safety awareness;Decreased cognition        OT Goals(Current goals can be found in the care plan section)   Acute Rehab OT Goals Patient Stated Goal: to go home OT Goal Formulation: With patient Time For Goal Achievement: 01/27/24 Potential to Achieve Goals: Fair   AM-PAC OT 6 Clicks Daily Activity     Outcome Measure Help from another person eating meals?: None Help from another person taking care of personal grooming?: None Help from another person toileting, which includes using toliet, bedpan, or urinal?: None Help from another person bathing (including washing, rinsing, drying)?: None Help from another person to put on and taking off regular upper body clothing?: None Help from another person to put on and taking off regular lower body clothing?: None 6 Click Score: 24   End of Session Equipment Utilized During Treatment: Rolling walker (2 wheels) Nurse Communication: Mobility status  Activity Tolerance: Patient tolerated treatment well Patient left: in bed;with call bell/phone within reach  OT Visit Diagnosis: Other abnormalities of gait and mobility (R26.89);Muscle weakness (generalized) (M62.81);Pain;Other symptoms and signs involving cognitive function Pain - Right/Left: Right  Time: 8843-8784 OT Time Calculation (min): 19 min Charges:  OT General Charges $OT Visit: 1 Visit OT Evaluation $OT Eval Low Complexity: 1 Low  Joan Wood, OTR/L Acute Rehabilitation Services Office [phone] Secure Chat Communication  Preferred   Joan Wood 01/27/2024, 1:09 PM

## 2024-01-27 NOTE — Evaluation (Signed)
 Physical Therapy Evaluation and Discharge Patient Details Name: Joan Wood MRN: [id number] DOB: 06-30-1970 Today's Date: 01/27/2024  History of Present Illness  54 y.o. female admitted 10/03/2023 with nausea/vomiting and bloody diarrhea. Workup for decompensated alcoholic cirrhosis, ascites, hepatic encephalopathy. S/p upper EGD 9/25 concerning for possible acute esophageal necrosis, portal hypertension, non-bleeding gastric and duodenal ulcers. S/p recurrent paracentesis for recurrent ascites. Hospital course also complicated by hepatic encephalopathy requiring lactulose  and rifaximin .  Intermittently that she requires paracentesis. PMH includes decompensated cirrhosis, PUD, GERD, pancreatitis, OSA, peripheral neuropathy, tobacco abuse, alcohol  abuse, severe protein malnutrition.   Clinical Impression  Pt greeted supine in bed, pleasant and agreeable to PT evaluation. She has had a prolonged acute stay. PTA, pt was independent with functional mobility, and ADLs. She lives with her friend/roommate in a one story house with 5 STE. Pt demonstrated BLE strength, sensation, and coordination WFL. She performed bed mobility, transfers, gait, and stairs with modI. Pt continues to be limited by decreased cognition, fear of falling, and decreased safety awareness. Pt completed the DGI which assessed her ability to maintain walking balance while responding to different task demands, through various dynamic conditions. She scored a 22/24, which is above the cut-off for increased fall risk (<19/24) as well as 2 points higher than when it was was last assessed (12/07/23). Pt completed the BERG, which assessed her static balance and fall risk. She scored a 44/56, which is above the cut-off for increased fall risk (<45). Pt engaged in higher level balance challenges including backwards walking and braiding without LOB. She demonstrated good postural righting reactions and would reach out with BUE for additional support.  No overt LOB or unsteadiness during session or observed as pt walked to nursing station during the day. She ambulated ~371ft using a reciprocal gait pattern without AD. Pt took intermittent standing rest breaks, VSS on RA. She reported slight fatigue. Educated pt against furniture walking/surfing and advised her to use SPC vs. RW for increased support/stability. Pt is progressing towards her PLOF. She does not required further acute PT services. Pt reports she is planning to stay at her daughter's home at d/c who can reportedly provide 24/7 support. If this is accurate pt could return home with HHPT vs. OPPT for further balance training. Otherwise, pt would from continued inpatient follow up therapy, <3 hours/day. PT to sign off with appreciation of order, please re-consult if needed.     If plan is discharge home, recommend the following: Direct supervision/assist for medications management;Direct supervision/assist for financial management;Supervision due to cognitive status;Help with stairs or ramp for entrance;Assist for transportation   Can travel by private vehicle        Equipment Recommendations BSC/3in1  Recommendations for Other Services       Functional Status Assessment Patient has had a recent decline in their functional status and demonstrates the ability to make significant improvements in function in a reasonable and predictable amount of time.     Precautions / Restrictions Precautions Precautions: Fall Recall of Precautions/Restrictions: Impaired Precaution/Restrictions Comments: fearful of falling Restrictions Weight Bearing Restrictions Per Provider Order: No      Mobility  Bed Mobility Overal bed mobility: Independent             General bed mobility comments: HOB flat, no use of bed rails.    Transfers Overall transfer level: Modified independent Equipment used: None               General transfer comment: Pt performed sit<>stand, bed<>chair,  and  bathroom transfer without AD. As pt fatigued, she became slightly unsteady but could self-correct through postural righting reactions and HHA. Observed pt intermittently attempt to furniture walk. Educated pt to use RW for increased support/stability. Observed pt walk to nursing station without AD.    Ambulation/Gait Ambulation/Gait assistance: Modified independent (Device/Increase time) Gait Distance (Feet): 350 Feet Assistive device: None Gait Pattern/deviations: Step-through pattern, Decreased stride length, Drifts right/left, Wide base of support Gait velocity: WFL Gait velocity interpretation: 1.31 - 2.62 ft/sec, indicative of limited community ambulator   General Gait Details: Pt ambulated with a step-through gait pattern. She maintains a WBOS. Pt alternated between reciprocal arm swing and keeping hands resting on posterior hips to support her back d/t discomfort. Pt navigated room, bathroom, hallway, stairwell, and gym without LOB or instability.  Stairs Stairs: Yes Stairs assistance: Modified independent (Device/Increase time) Stair Management: Forwards, Two rails, One rail Right, Step to pattern, Alternating pattern Number of Stairs: 10 (1x10; 3x5) General stair comments: Pt ascended/descended a flight of stairs in the stairwell. Her technique varied. Initially going up with a step-to pattern with BUE support and then alternating the last 3 steps. She paused to rest at the top. Pt began to go down forwards with RUE support on rail, one step at a time. Then transitioned to BUE support on unilateral rail and approaching the step sideways. Pt asked to perform the stairs in the gym, 2 standard height ~6 and 3 short ~4 steps. She initially demonstrated a alternating pattern going up and a step-to pattern going down. On the second and third attempt pt was able to use a reciprocal gait pattern while maintaining BUE support.  Wheelchair Mobility     Tilt Bed    Modified Rankin (Stroke  Patients Only)       Balance                 Single Leg Stance - Right Leg: 3 Single Leg Stance - Left Leg: 2 Tandem Stance - Right Leg: 20 Tandem Stance - Left Leg: 10 Rhomberg - Eyes Opened: 25 Rhomberg - Eyes Closed: 15 High level balance activites: Backward walking, Braiding High Level Balance Comments: Pt was able to walk backwards, but was fearful so required 1 HHA. Pt completed braiding in both directions keeping each UE out to the side to aid with stability. Standardized Balance Assessment Standardized Balance Assessment : Dynamic Gait Index, Berg Balance Test Berg Balance Test Sit to Stand: Able to stand without using hands and stabilize independently Standing Unsupported: Able to stand safely 2 minutes Sitting with Back Unsupported but Feet Supported on Floor or Stool: Able to sit safely and securely 2 minutes Stand to Sit: Sits safely with minimal use of hands Transfers: Able to transfer safely, minor use of hands Standing Unsupported with Eyes Closed: Able to stand 10 seconds with supervision Standing Ubsupported with Feet Together: Able to place feet together independently and stand for 1 minute with supervision From Standing, Reach Forward with Outstretched Arm: Can reach forward >5 cm safely (2) From Standing Position, Pick up Object from Floor: Able to pick up shoe safely and easily From Standing Position, Turn to Look Behind Over each Shoulder: Looks behind from both sides and weight shifts well Turn 360 Degrees: Able to turn 360 degrees safely in 4 seconds or less Standing Unsupported, Alternately Place Feet on Step/Stool: Able to complete >2 steps/needs minimal assist Standing Unsupported, One Foot in Front: Able to take small step independently and hold 30  seconds Standing on One Leg: Tries to lift leg/unable to hold 3 seconds but remains standing independently Total Score: 44 Dynamic Gait Index Level Surface: Normal Change in Gait Speed: Normal Gait  with Horizontal Head Turns: Normal Gait with Vertical Head Turns: Normal Gait and Pivot Turn: Normal Step Over Obstacle: Mild Impairment Step Around Obstacles: Normal Steps: Mild Impairment Total Score: 22       Pertinent Vitals/Pain Pain Assessment Pain Assessment: Faces Faces Pain Scale: Hurts a little bit Pain Location: Abdomen, B hips, and Back    Home Living Family/patient expects to be discharged to:: Private residence Living Arrangements: Non-relatives/Friends (Roommate, Lacinda) Available Help at Discharge: Friend(s);Available PRN/intermittently;Family Type of Home: House Home Access: Stairs to enter Entrance Stairs-Rails: Right Entrance Stairs-Number of Steps: 5   Home Layout: One level Home Equipment: Cane - single point;Wheelchair - manual;Shower seat Additional Comments: Pt reports she is planning to discharge to her daughter's house, which she reports is a similar set up to the above. 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    Prior Function Prior Level of Function : Independent/Modified Independent             Mobility Comments: pt reports independence in mobility until the last 4-6 weeks. Since she has been mobilizing for very short household distances and requiring support of her [facility] or furniture - 01/27/24 pt has been mobilizing in her room and on the unit with RW, no assist ADLs Comments: pt reports independence at baseline but has been having great difficulty with ADLs in the last 4-6 weeks. Sometimes the pt has been going multiple days without bathing or changing clothes. reports can manage microwave meals - 1/18/296 pt has been managing ADLs acutely without assist     Extremity/Trunk Assessment   Upper Extremity Assessment Upper Extremity Assessment: Defer to OT evaluation    Lower Extremity Assessment Lower Extremity Assessment: Overall WFL for tasks assessed    Cervical / Trunk  Assessment Cervical / Trunk Assessment: Kyphotic  Communication   Communication Communication: No apparent difficulties    Cognition Arousal: Alert Behavior During Therapy: WFL for tasks assessed/performed   PT - Cognitive impairments: Awareness, Problem solving, Safety/Judgement, Attention                       PT - Cognition Comments: Pt with decreased insight into current condition. She is verbose and required frequent re-direction to focus on task. Following commands: Impaired Following commands impaired: Follows multi-step commands with increased time (self-distracting)     Cueing Cueing Techniques: Verbal cues, Visual cues     General Comments General comments (skin integrity, edema, etc.): VSS on RA    Exercises     Assessment/Plan    PT Assessment All further PT needs can be met in the next venue of care  PT Problem List Decreased activity tolerance;Decreased mobility;Decreased balance;Decreased knowledge of use of DME;Decreased cognition;Decreased safety awareness;Decreased knowledge of precautions;Pain       PT Treatment Interventions      PT Goals (Current goals can be found in the Care Plan section)  Acute Rehab PT Goals Patient Stated Goal: Regain independence and return Home PT Goal Formulation: All assessment and education complete, DC therapy    Frequency       Co-evaluation               AM-PAC PT 6 Clicks Mobility  Outcome Measure Help needed turning  from your back to your side while in a flat bed without using bedrails?: None Help needed moving from lying on your back to sitting on the side of a flat bed without using bedrails?: None Help needed moving to and from a bed to a chair (including a wheelchair)?: None Help needed standing up from a chair using your arms (e.g., wheelchair or bedside chair)?: None Help needed to walk in hospital room?: None Help needed climbing 3-5 steps with a railing? : None 6 Click Score: 24     End of Session Equipment Utilized During Treatment: Gait belt Activity Tolerance: Patient tolerated treatment well Patient left: in bed;with call bell/phone within reach Nurse Communication: Mobility status PT Visit Diagnosis: Other abnormalities of gait and mobility (R26.89);Unsteadiness on feet (R26.81);Difficulty in walking, not elsewhere classified (R26.2)    Time: 1355-1435 PT Time Calculation (min) (ACUTE ONLY): 40 min   Charges:   PT Evaluation $PT Eval Low Complexity: 1 Low PT Treatments $Gait Training: 23-37 mins PT General Charges $$ ACUTE PT VISIT: 1 Visit         Randall SAUNDERS, PT, DPT Acute Rehabilitation Services Office: [phone] Secure Chat Preferred  Joan Wood 01/27/2024, 3:07 PM

## 2024-01-27 NOTE — Plan of Care (Signed)
   Problem: Health Behavior/Discharge Planning: Goal: Ability to manage health-related needs will improve Outcome: Progressing   Problem: Activity: Goal: Risk for activity intolerance will decrease Outcome: Progressing

## 2024-01-28 DIAGNOSIS — K7460 Unspecified cirrhosis of liver: Secondary | ICD-10-CM | POA: Diagnosis not present

## 2024-01-28 DIAGNOSIS — K7290 Hepatic failure, unspecified without coma: Secondary | ICD-10-CM | POA: Diagnosis not present

## 2024-01-28 NOTE — Plan of Care (Signed)
  Problem: Health Behavior/Discharge Planning: Goal: Ability to manage health-related needs will improve Outcome: Progressing   Problem: Clinical Measurements: Goal: Respiratory complications will improve Outcome: Progressing   

## 2024-01-28 NOTE — Progress Notes (Signed)
 " PROGRESS NOTE    Joan Wood  FMW:[id number] DOB: 12-Jan-1970 DOA: 10/03/2023 PCP: Edman Meade PEDLAR, FNP    Brief Narrative:  54 year old with history of decompensated liver cirrhosis, PUD, gastric ulcer perforation requiring exploratory laparotomy, GERD, pancreatitis, OSA admitted initially for coffee-ground emesis and bloody diarrhea.  Consulted GI underwent endoscopy which revealed nonbleeding ulcer and esophagitis and some concerns of esophageal necrosis.  This was treated by GI team and IR had also been consulted.  Hospital course also complicated by hepatic encephalopathy requiring lactulose  and rifaximin .  Intermittently that she requires paracentesis  Ongoing safe disposition at this time   Assessment & Plan:  Decompensated alcoholic liver cirrhosis Hepatic encephalopathy Recurrent ascites -Earlier in the admission patient was seen by gastroenterology and had undergone extensive workup including endoscopy, received NAC protocol for 5 days.  She reports also been getting Lasix  and rifaximin  along with PPI.  Nothing further to add from gastroenterology standpoint besides outpatient follow-up therefore their service had signed off. -IR is performing periodic paracentesis  Cold sores  - improved with acyclovir    Left eye stye No associated erythema or eye pain - completed erythromycin  ointments   Left torso folliculitis/boil - Ordered topical mupirocin    AKI -Secondary to ATN from contrast, renal function back to baseline   Bilateral sacral insufficiency fracture - Seen on the CT scan.  Supportive care, pain control   Appendicitis  -Earlier during the admission, general surgery recommended conservative management General surgery consulted unfortunately not a candidate for surgical management due to high risk.  Patient was recommended conservative management with antibiotics, completed the antibiotic regimen. No further abdominal pain    Hypokalemia Hypomagnesemia -  Replete as needed   Chronic hyponatremia  -Secondary to underlying cirrhosis   ACute upper GI bleed S/p a total of 4 units of PRBC transfusion. -By GI had undergone endoscopy earlier in admission.  Continue PPI    Lower extremity edema -Secondary to hypoalbuminemia from liver disease   Anxiety -Continue with hydroxyzine    History of tobacco abuse -Refusing nicotine  patch   Chronic pain syndrome - cymbalta  relatively contraindicated with her liver disease.   Severe malnutrition Dietitian recommendations (11/11): - Continue with 1.2 L fluid restriction. - Continue Magic Cup berry and choc BID. Each supplement provides 290 Kcals and 9 grams of protein. - Continue Ensure+HP TID. Each supplement provides 350 kcal and 20 grams of protein. - Continue daily multivitamin PO, 100 mg thiamine  PO daily, and 1 mg folic acid  PO daily. - Continue Juven BID for wound healing.   Pressure injury -Mid sacrum. Unclear if present on admission.    Goals of care Patient has been seen by palliative care and continues to desire full scope medical care.   PT to reassess  DVT prophylaxis: SCDs Start: 10/03/23 2153    Code Status: Full Code Family Communication:   Status is: Inpatient Remains inpatient appropriate because: Placement   PT Follow up Recs: Skilled Nursing-Short Term Rehab (<3 Hours/Day)12/07/2023 1703  Subjective: No new complaints, no acute events overnight Laying in the bed resting  Examination:  General exam: Appears calm and comfortable  Respiratory system: Clear to auscultation. Respiratory effort normal. Cardiovascular system: S1 & S2 heard, RRR. No JVD, murmurs, rubs, gallops or clicks. No pedal edema. Gastrointestinal system: Abdomen is nondistended, soft and nontender. No organomegaly or masses felt. Normal bowel sounds heard. Central nervous system: Alert and oriented. No focal neurological deficits. Extremities: Symmetric 5 x 5 power. Skin: No rashes, lesions or  ulcers Psychiatry: Judgement and insight appear normal. Mood & affect appropriate.                Diet Orders (From admission, onward)     Start     Ordered   11/23/23 1202  Diet 2 gram sodium Room service appropriate? Yes with Assist; Fluid consistency: Thin; Fluid restriction: 1500 mL Fluid  Diet effective now       Question Answer Comment  Room service appropriate? Yes with Assist   Fluid consistency: Thin   Fluid restriction: 1500 mL Fluid      11/23/23 1202            Objective: Vitals:   01/27/24 0732 01/27/24 2027 01/28/24 0328 01/28/24 0844  BP: (!) 106/59 139/83 119/78 132/77  Pulse: 81 83 82 87  Resp: 18 18 18 18   Temp: 98.1 F (36.7 C) 98.1 F (36.7 C) 98.5 F (36.9 C) 97.7 F (36.5 C)  TempSrc: Oral Oral Oral Oral  SpO2:  99% 98% 100%  Weight:      Height:        Intake/Output Summary (Last 24 hours) at 01/28/2024 0950 Last data filed at 01/28/2024 0900 Gross per 24 hour  Intake 1080 ml  Output --  Net 1080 ml   Filed Weights   01/19/24 0712 01/22/24 0354 01/26/24 0500  Weight: 59.2 kg 61.7 kg 58.7 kg    Scheduled Meds:  sodium chloride    Intravenous Once   calcium  carbonate  1 tablet Oral TID WC   feeding supplement  237 mL Oral TID BM   folic acid   1 mg Oral Daily   furosemide   40 mg Intravenous BID   gabapentin   600 mg Oral TID   lactulose   30 g Oral TID   lidocaine   2 patch Transdermal Q24H   lidocaine -EPINEPHrine   20 mL Intradermal Once   magnesium  oxide  400 mg Oral Daily   midodrine   20 mg Oral TID WC   multivitamin with minerals  1 tablet Oral Daily   mupirocin  cream   Topical BID   nutrition supplement (JUVEN)  1 packet Oral BID BM   pantoprazole   40 mg Oral BID   potassium chloride   20 mEq Oral Daily   QUEtiapine   25 mg Oral QHS   spironolactone   50 mg Oral Daily   thiamine   100 mg Oral Daily   triamcinolone  0.1 % cream : eucerin   Topical Daily   Continuous Infusions:  Nutritional status Signs/Symptoms: severe  fat depletion, severe muscle depletion Interventions: Ensure Enlive (each supplement provides 350kcal and 20 grams of protein), MVI, Liberalize Diet, Magic cup Body mass index is 23.67 kg/m.  Data Reviewed:   CBC: Recent Labs  Lab 01/22/24 1133  WBC 4.6  HGB 8.9*  HCT 26.9*  MCV 87.3  PLT 162   Basic Metabolic Panel: Recent Labs  Lab 01/22/24 1133 01/27/24 0425  NA 132* 134*  K 3.4* 3.3*  CL 95* 96*  CO2 30 27  GLUCOSE 101* 75  BUN 15 12  CREATININE 0.60 0.68  CALCIUM  8.3* 8.1*  MG 1.6* 1.8  PHOS 4.1  --    GFR: Estimated Creatinine Clearance: 63.6 mL/min (by C-G formula based on SCr of 0.68 mg/dL). Liver Function Tests: No results for input(s): AST, ALT, ALKPHOS, BILITOT, PROT, ALBUMIN  in the last 168 hours. No results for input(s): LIPASE, AMYLASE in the last 168 hours. No results for input(s): AMMONIA in the last 168 hours. Coagulation Profile: No results  for input(s): INR, PROTIME in the last 168 hours. Cardiac Enzymes: No results for input(s): CKTOTAL, CKMB, CKMBINDEX, TROPONINI in the last 168 hours. BNP (last 3 results) No results for input(s): PROBNP in the last 8760 hours. HbA1C: No results for input(s): HGBA1C in the last 72 hours. CBG: No results for input(s): GLUCAP in the last 168 hours. Lipid Profile: No results for input(s): CHOL, HDL, LDLCALC, TRIG, CHOLHDL, LDLDIRECT in the last 72 hours. Thyroid  Function Tests: No results for input(s): TSH, T4TOTAL, FREET4, T3FREE, THYROIDAB in the last 72 hours. Anemia Panel: No results for input(s): VITAMINB12, FOLATE, FERRITIN, TIBC, IRON, RETICCTPCT in the last 72 hours. Sepsis Labs: No results for input(s): PROCALCITON, LATICACIDVEN in the last 168 hours.  No results found for this or any previous visit (from the past 240 hours).       Radiology Studies: No results found.         LOS: 117 days   Time spent= 35  mins    Burgess JAYSON Dare, MD [facility]  If 7PM-7AM, please contact night-coverage  01/28/2024, 9:50 AM  "

## 2024-01-29 DIAGNOSIS — K7460 Unspecified cirrhosis of liver: Secondary | ICD-10-CM | POA: Diagnosis not present

## 2024-01-29 DIAGNOSIS — K7290 Hepatic failure, unspecified without coma: Secondary | ICD-10-CM | POA: Diagnosis not present

## 2024-01-29 LAB — BASIC METABOLIC PANEL WITH GFR
Anion gap: 9 (ref 5–15)
BUN: 8 mg/dL (ref 6–20)
CO2: 26 mmol/L (ref 22–32)
Calcium: 8.1 mg/dL — ABNORMAL LOW (ref 8.9–10.3)
Chloride: 93 mmol/L — ABNORMAL LOW (ref 98–111)
Creatinine, Ser: 0.62 mg/dL (ref 0.44–1.00)
GFR, Estimated: >60 mL/min
Glucose, Bld: 84 mg/dL (ref 70–99)
Potassium: 3.2 mmol/L — ABNORMAL LOW (ref 3.5–5.1)
Sodium: 129 mmol/L — ABNORMAL LOW (ref 135–145)

## 2024-01-29 LAB — MAGNESIUM: Magnesium: 1.8 mg/dL (ref 1.7–2.4)

## 2024-01-29 MED ORDER — MIDODRINE HCL 5 MG PO TABS
10.0000 mg | ORAL_TABLET | Freq: Three times a day (TID) | ORAL | Status: AC
  Administered 2024-01-29 – 2024-02-15 (×52): 10 mg via ORAL
  Filled 2024-01-29 (×52): qty 2

## 2024-01-29 MED ORDER — FUROSEMIDE 40 MG PO TABS
40.0000 mg | ORAL_TABLET | Freq: Two times a day (BID) | ORAL | Status: DC
  Administered 2024-01-29 – 2024-02-02 (×9): 40 mg via ORAL
  Filled 2024-01-29 (×9): qty 1

## 2024-01-29 MED ORDER — MAGNESIUM OXIDE -MG SUPPLEMENT 400 (240 MG) MG PO TABS
400.0000 mg | ORAL_TABLET | Freq: Two times a day (BID) | ORAL | Status: AC
  Administered 2024-01-29 – 2024-02-15 (×36): 400 mg via ORAL
  Filled 2024-01-29 (×35): qty 1

## 2024-01-29 MED ORDER — POTASSIUM CHLORIDE CRYS ER 20 MEQ PO TBCR
40.0000 meq | EXTENDED_RELEASE_TABLET | ORAL | Status: AC
  Administered 2024-01-29 (×2): 40 meq via ORAL
  Filled 2024-01-29 (×2): qty 2

## 2024-01-29 MED ORDER — HALOPERIDOL LACTATE 5 MG/ML IJ SOLN
2.0000 mg | Freq: Once | INTRAMUSCULAR | Status: AC
  Administered 2024-01-29: 2 mg via INTRAVENOUS
  Filled 2024-01-29: qty 1

## 2024-01-29 NOTE — Progress Notes (Signed)
 " PROGRESS NOTE    Joan Wood  FMW:[id number] DOB: [birth year] DOA: 10/03/2023 PCP: Edman Meade PEDLAR, FNP    Brief Narrative:  54 year old with history of decompensated liver cirrhosis, PUD, gastric ulcer perforation requiring exploratory laparotomy, GERD, pancreatitis, OSA admitted initially for coffee-ground emesis and bloody diarrhea.  Consulted GI underwent endoscopy which revealed nonbleeding ulcer and esophagitis and some concerns of esophageal necrosis.  This was treated by GI team and IR had also been consulted.  Hospital course also complicated by hepatic encephalopathy requiring lactulose  and rifaximin .  Intermittently that she requires paracentesis  Ongoing safe disposition at this time   Assessment & Plan:  Decompensated alcoholic liver cirrhosis Hepatic encephalopathy Recurrent ascites -Earlier in the admission patient was seen by gastroenterology and had undergone extensive workup including endoscopy, received NAC protocol for 5 days.  and rifaximin  along with PPI.  Nothing further to add from gastroenterology standpoint besides outpatient follow-up therefore their service had signed off. -IR is performing periodic paracentesis -Currently on Lasix , Aldactone , lactulose  -Reduce midodrine  20mg  > 10mg  tid.   Cold sores  - improved with acyclovir    Left eye stye No associated erythema or eye pain - completed erythromycin  ointments   Left torso folliculitis/boil - Ordered topical mupirocin    AKI -Secondary to ATN from contrast, renal function back to baseline   Bilateral sacral insufficiency fracture - Seen on the CT scan.  Supportive care, pain control   Appendicitis  -Earlier during the admission, general surgery recommended conservative management General surgery consulted unfortunately not a candidate for surgical management due to high risk.  Patient was recommended conservative management with antibiotics, completed the antibiotic regimen. No further abdominal  pain    Hypokalemia Hypomagnesemia - Replete as needed   Chronic hyponatremia  -Secondary to underlying cirrhosis   ACute upper GI bleed S/p a total of 4 units of PRBC transfusion. -By GI had undergone endoscopy earlier in admission.  Continue PPI    Lower extremity edema -Secondary to hypoalbuminemia from liver disease   Anxiety -Continue with hydroxyzine    History of tobacco abuse -Refusing nicotine  patch   Chronic pain syndrome - cymbalta  relatively contraindicated with her liver disease.   Severe malnutrition Dietitian recommendations (11/11): - Continue with 1.2 L fluid restriction. - Continue Magic Cup berry and choc BID. Each supplement provides 290 Kcals and 9 grams of protein. - Continue Ensure+HP TID. Each supplement provides 350 kcal and 20 grams of protein. - Continue daily multivitamin PO, 100 mg thiamine  PO daily, and 1 mg folic acid  PO daily. - Continue Juven BID for wound healing.   Pressure injury -Mid sacrum. Unclear if present on admission.    Goals of care Patient has been seen by palliative care and continues to desire full scope medical care.   PT to reassess  DVT prophylaxis: SCDs Start: 10/03/23 2153    Code Status: Full Code Family Communication:   Status is: Inpatient Remains inpatient appropriate because: Placement   PT Follow up Recs: Skilled Nursing-Short Term Rehab (<3 Hours/Day)12/07/2023 1703  Subjective: No complaints, laying in the bed comfortably  Examination:  General exam: Appears calm and comfortable  Respiratory system: Clear to auscultation. Respiratory effort normal. Cardiovascular system: S1 & S2 heard, RRR. No JVD, murmurs, rubs, gallops or clicks. No pedal edema. Gastrointestinal system: Abdomen is nondistended, soft and nontender. No organomegaly or masses felt. Normal bowel sounds heard. Central nervous system: Alert and oriented. No focal neurological deficits. Extremities: Symmetric 5 x 5 power. Skin: No  rashes, lesions  or ulcers Psychiatry: Judgement and insight appear normal. Mood & affect appropriate.                Diet Orders (From admission, onward)     Start     Ordered   11/23/23 1202  Diet 2 gram sodium Room service appropriate? Yes with Assist; Fluid consistency: Thin; Fluid restriction: 1500 mL Fluid  Diet effective now       Question Answer Comment  Room service appropriate? Yes with Assist   Fluid consistency: Thin   Fluid restriction: 1500 mL Fluid      11/23/23 1202            Objective: Vitals:   01/28/24 0328 01/28/24 0844 01/28/24 1959 01/29/24 0840  BP: 119/78 132/77 (!) 148/81 121/69  Pulse: 82 87 86 90  Resp: 18 18 18 18   Temp: 98.5 F (36.9 C) 97.7 F (36.5 C) 97.9 F (36.6 C) 97.8 F (36.6 C)  TempSrc: Oral Oral Oral Oral  SpO2: 98% 100% 98% 100%  Weight:      Height:        Intake/Output Summary (Last 24 hours) at 01/29/2024 1033 Last data filed at 01/29/2024 0900 Gross per 24 hour  Intake 840 ml  Output --  Net 840 ml   Filed Weights   01/19/24 0712 01/22/24 0354 01/26/24 0500  Weight: 59.2 kg 61.7 kg 58.7 kg    Scheduled Meds:  sodium chloride    Intravenous Once   calcium  carbonate  1 tablet Oral TID WC   feeding supplement  237 mL Oral TID BM   folic acid   1 mg Oral Daily   furosemide   40 mg Oral BID   gabapentin   600 mg Oral TID   lactulose   30 g Oral TID   lidocaine   2 patch Transdermal Q24H   lidocaine -EPINEPHrine   20 mL Intradermal Once   magnesium  oxide  400 mg Oral BID   midodrine   10 mg Oral TID WC   multivitamin with minerals  1 tablet Oral Daily   nutrition supplement (JUVEN)  1 packet Oral BID BM   pantoprazole   40 mg Oral BID   potassium chloride   20 mEq Oral Daily   potassium chloride   40 mEq Oral Q4H   QUEtiapine   25 mg Oral QHS   spironolactone   50 mg Oral Daily   thiamine   100 mg Oral Daily   triamcinolone  0.1 % cream : eucerin   Topical Daily   Continuous Infusions:  Nutritional  status Signs/Symptoms: severe fat depletion, severe muscle depletion Interventions: Ensure Enlive (each supplement provides 350kcal and 20 grams of protein), MVI, Liberalize Diet, Magic cup Body mass index is 23.67 kg/m.  Data Reviewed:   CBC: Recent Labs  Lab 01/22/24 1133  WBC 4.6  HGB 8.9*  HCT 26.9*  MCV 87.3  PLT 162   Basic Metabolic Panel: Recent Labs  Lab 01/22/24 1133 01/27/24 0425 01/29/24 0430  NA 132* 134* 129*  K 3.4* 3.3* 3.2*  CL 95* 96* 93*  CO2 30 27 26   GLUCOSE 101* 75 84  BUN 15 12 8   CREATININE 0.60 0.68 0.62  CALCIUM  8.3* 8.1* 8.1*  MG 1.6* 1.8 1.8  PHOS 4.1  --   --    GFR: Estimated Creatinine Clearance: 63.6 mL/min (by C-G formula based on SCr of 0.62 mg/dL). Liver Function Tests: No results for input(s): AST, ALT, ALKPHOS, BILITOT, PROT, ALBUMIN  in the last 168 hours. No results for input(s): LIPASE, AMYLASE in the last  168 hours. No results for input(s): AMMONIA in the last 168 hours. Coagulation Profile: No results for input(s): INR, PROTIME in the last 168 hours. Cardiac Enzymes: No results for input(s): CKTOTAL, CKMB, CKMBINDEX, TROPONINI in the last 168 hours. BNP (last 3 results) No results for input(s): PROBNP in the last 8760 hours. HbA1C: No results for input(s): HGBA1C in the last 72 hours. CBG: No results for input(s): GLUCAP in the last 168 hours. Lipid Profile: No results for input(s): CHOL, HDL, LDLCALC, TRIG, CHOLHDL, LDLDIRECT in the last 72 hours. Thyroid  Function Tests: No results for input(s): TSH, T4TOTAL, FREET4, T3FREE, THYROIDAB in the last 72 hours. Anemia Panel: No results for input(s): VITAMINB12, FOLATE, FERRITIN, TIBC, IRON, RETICCTPCT in the last 72 hours. Sepsis Labs: No results for input(s): PROCALCITON, LATICACIDVEN in the last 168 hours.  No results found for this or any previous visit (from the past 240 hours).        Radiology Studies: No results found.         LOS: 118 days   Time spent= 35 mins    Burgess JAYSON Dare, MD [facility]  If 7PM-7AM, please contact night-coverage  01/29/2024, 10:33 AM  "

## 2024-01-29 NOTE — Plan of Care (Signed)
  Problem: Health Behavior/Discharge Planning: Goal: Ability to manage health-related needs will improve Outcome: Completed/Met   Problem: Clinical Measurements: Goal: Ability to maintain clinical measurements within normal limits will improve Outcome: Completed/Met Goal: Will remain free from infection Outcome: Completed/Met

## 2024-01-29 NOTE — Plan of Care (Signed)
  Problem: Clinical Measurements: Goal: Diagnostic test results will improve Outcome: Progressing Goal: Respiratory complications will improve Outcome: Progressing Goal: Cardiovascular complication will be avoided Outcome: Progressing   Problem: Activity: Goal: Risk for activity intolerance will decrease Outcome: Progressing   Problem: Nutrition: Goal: Adequate nutrition will be maintained Outcome: Progressing   Problem: Coping: Goal: Level of anxiety will decrease Outcome: Progressing   Problem: Elimination: Goal: Will not experience complications related to bowel motility Outcome: Progressing Goal: Will not experience complications related to urinary retention Outcome: Progressing   Problem: Pain Managment: Goal: General experience of comfort will improve and/or be controlled Outcome: Progressing   Problem: Safety: Goal: Ability to remain free from injury will improve Outcome: Progressing   Problem: Skin Integrity: Goal: Risk for impaired skin integrity will decrease Outcome: Progressing

## 2024-01-30 DIAGNOSIS — K7290 Hepatic failure, unspecified without coma: Secondary | ICD-10-CM | POA: Diagnosis not present

## 2024-01-30 DIAGNOSIS — K7460 Unspecified cirrhosis of liver: Secondary | ICD-10-CM | POA: Diagnosis not present

## 2024-01-30 LAB — COMPREHENSIVE METABOLIC PANEL WITH GFR
ALT: 26 U/L (ref 0–44)
AST: 56 U/L — ABNORMAL HIGH (ref 15–41)
Albumin: 2.8 g/dL — ABNORMAL LOW (ref 3.5–5.0)
Alkaline Phosphatase: 112 U/L (ref 38–126)
Anion gap: 9 (ref 5–15)
BUN: 8 mg/dL (ref 6–20)
CO2: 26 mmol/L (ref 22–32)
Calcium: 8.8 mg/dL — ABNORMAL LOW (ref 8.9–10.3)
Chloride: 98 mmol/L (ref 98–111)
Creatinine, Ser: 0.63 mg/dL (ref 0.44–1.00)
GFR, Estimated: >60 mL/min
Glucose, Bld: 94 mg/dL (ref 70–99)
Potassium: 3.9 mmol/L (ref 3.5–5.1)
Sodium: 133 mmol/L — ABNORMAL LOW (ref 135–145)
Total Bilirubin: 0.8 mg/dL (ref 0.0–1.2)
Total Protein: 7.4 g/dL (ref 6.5–8.1)

## 2024-01-30 NOTE — Progress Notes (Signed)
 " PROGRESS NOTE    Joan Wood  FMW:[id number] DOB: 1970/08/19 DOA: 10/03/2023 PCP: Edman Meade PEDLAR, FNP    Brief Narrative:  54 year old with history of decompensated liver cirrhosis, PUD, gastric ulcer perforation requiring exploratory laparotomy, GERD, pancreatitis, OSA admitted initially for coffee-ground emesis and bloody diarrhea.  Consulted GI underwent endoscopy which revealed nonbleeding ulcer and esophagitis and some concerns of esophageal necrosis.  This was treated by GI team and IR had also been consulted.  Hospital course also complicated by hepatic encephalopathy requiring lactulose  and rifaximin .  Intermittently that she requires paracentesis  Ongoing safe disposition at this time   Assessment & Plan:  Decompensated alcoholic liver cirrhosis Hepatic encephalopathy Recurrent ascites -Earlier in the admission patient was seen by gastroenterology and had undergone extensive workup including endoscopy, received NAC protocol for 5 days.  and rifaximin  along with PPI.  Nothing further to add from gastroenterology standpoint besides outpatient follow-up therefore their service had signed off. -IR is performing periodic paracentesis -Currently on Lasix , Aldactone , lactulose  -Reduce midodrine  20mg  > 10mg  tid.   Cold sores  - improved with acyclovir    Left eye stye No associated erythema or eye pain - completed erythromycin  ointments   Left torso folliculitis/boil - Ordered topical mupirocin    AKI -Secondary to ATN from contrast, renal function back to baseline   Bilateral sacral insufficiency fracture - Seen on the CT scan.  Supportive care, pain control   Appendicitis  -Earlier during the admission, general surgery recommended conservative management General surgery consulted unfortunately not a candidate for surgical management due to high risk.  Patient was recommended conservative management with antibiotics, completed the antibiotic regimen. No further abdominal  pain    Hypokalemia Hypomagnesemia - Replete as needed   Chronic hyponatremia  -Secondary to underlying cirrhosis   ACute upper GI bleed S/p a total of 4 units of PRBC transfusion. -By GI had undergone endoscopy earlier in admission.  Continue PPI    Lower extremity edema -Secondary to hypoalbuminemia from liver disease   Anxiety -Continue with hydroxyzine    History of tobacco abuse -Refusing nicotine  patch   Chronic pain syndrome - cymbalta  relatively contraindicated with her liver disease.   Severe malnutrition Dietitian recommendations (11/11): - Continue with 1.2 L fluid restriction. - Continue Magic Cup berry and choc BID. Each supplement provides 290 Kcals and 9 grams of protein. - Continue Ensure+HP TID. Each supplement provides 350 kcal and 20 grams of protein. - Continue daily multivitamin PO, 100 mg thiamine  PO daily, and 1 mg folic acid  PO daily. - Continue Juven BID for wound healing.   Pressure injury -Mid sacrum. Unclear if present on admission.    Goals of care Patient has been seen by palliative care and continues to desire full scope medical care.   PT to reassess  DVT prophylaxis: SCDs Start: 10/03/23 2153    Code Status: Full Code Family Communication:   Status is: Inpatient Remains inpatient appropriate because: Placement   PT Follow up Recs: Skilled Nursing-Short Term Rehab (<3 Hours/Day)12/07/2023 1703  Subjective: No complaints, laying in the bed comfortably  Examination:  General exam: Appears calm and comfortable  Respiratory system: Clear to auscultation. Respiratory effort normal. Cardiovascular system: S1 & S2 heard, RRR. No JVD, murmurs, rubs, gallops or clicks. No pedal edema. Gastrointestinal system: Abdomen is nondistended, soft and nontender. No organomegaly or masses felt. Normal bowel sounds heard. Central nervous system: Alert and oriented. No focal neurological deficits. Extremities: Symmetric 5 x 5 power. Skin: No  rashes, lesions  or ulcers Psychiatry: Judgement and insight appear normal. Mood & affect appropriate.                Diet Orders (From admission, onward)     Start     Ordered   11/23/23 1202  Diet 2 gram sodium Room service appropriate? Yes with Assist; Fluid consistency: Thin; Fluid restriction: 1500 mL Fluid  Diet effective now       Question Answer Comment  Room service appropriate? Yes with Assist   Fluid consistency: Thin   Fluid restriction: 1500 mL Fluid      11/23/23 1202            Objective: Vitals:   01/28/24 1959 01/29/24 0840 01/29/24 1952 01/30/24 0842  BP: (!) 148/81 121/69 127/75 (!) 143/79  Pulse: 86 90 92 89  Resp: 18 18  18   Temp: 97.9 F (36.6 C) 97.8 F (36.6 C) 98.8 F (37.1 C) 97.7 F (36.5 C)  TempSrc: Oral Oral Oral Axillary  SpO2: 98% 100% 100% 100%  Weight:      Height:        Intake/Output Summary (Last 24 hours) at 01/30/2024 1011 Last data filed at 01/30/2024 0900 Gross per 24 hour  Intake 840 ml  Output --  Net 840 ml   Filed Weights   01/19/24 0712 01/22/24 0354 01/26/24 0500  Weight: 59.2 kg 61.7 kg 58.7 kg    Scheduled Meds:  sodium chloride    Intravenous Once   calcium  carbonate  1 tablet Oral TID WC   feeding supplement  237 mL Oral TID BM   folic acid   1 mg Oral Daily   furosemide   40 mg Oral BID   gabapentin   600 mg Oral TID   lactulose   30 g Oral TID   lidocaine   2 patch Transdermal Q24H   lidocaine -EPINEPHrine   20 mL Intradermal Once   magnesium  oxide  400 mg Oral BID   midodrine   10 mg Oral TID WC   multivitamin with minerals  1 tablet Oral Daily   nutrition supplement (JUVEN)  1 packet Oral BID BM   pantoprazole   40 mg Oral BID   potassium chloride   20 mEq Oral Daily   QUEtiapine   25 mg Oral QHS   spironolactone   50 mg Oral Daily   thiamine   100 mg Oral Daily   triamcinolone  0.1 % cream : eucerin   Topical Daily   Continuous Infusions:  Nutritional status Signs/Symptoms: severe fat depletion,  severe muscle depletion Interventions: Ensure Enlive (each supplement provides 350kcal and 20 grams of protein), MVI, Liberalize Diet, Magic cup Body mass index is 23.67 kg/m.  Data Reviewed:   CBC: No results for input(s): WBC, NEUTROABS, HGB, HCT, MCV, PLT in the last 168 hours. Basic Metabolic Panel: Recent Labs  Lab 01/27/24 0425 01/29/24 0430 01/30/24 0238  NA 134* 129* 133*  K 3.3* 3.2* 3.9  CL 96* 93* 98  CO2 27 26 26   GLUCOSE 75 84 94  BUN 12 8 8   CREATININE 0.68 0.62 0.63  CALCIUM  8.1* 8.1* 8.8*  MG 1.8 1.8  --    GFR: Estimated Creatinine Clearance: 63.6 mL/min (by C-G formula based on SCr of 0.63 mg/dL). Liver Function Tests: Recent Labs  Lab 01/30/24 0238  AST 56*  ALT 26  ALKPHOS 112  BILITOT 0.8  PROT 7.4  ALBUMIN  2.8*   No results for input(s): LIPASE, AMYLASE in the last 168 hours. No results for input(s): AMMONIA in the last 168 hours.  Coagulation Profile: No results for input(s): INR, PROTIME in the last 168 hours. Cardiac Enzymes: No results for input(s): CKTOTAL, CKMB, CKMBINDEX, TROPONINI in the last 168 hours. BNP (last 3 results) No results for input(s): PROBNP in the last 8760 hours. HbA1C: No results for input(s): HGBA1C in the last 72 hours. CBG: No results for input(s): GLUCAP in the last 168 hours. Lipid Profile: No results for input(s): CHOL, HDL, LDLCALC, TRIG, CHOLHDL, LDLDIRECT in the last 72 hours. Thyroid  Function Tests: No results for input(s): TSH, T4TOTAL, FREET4, T3FREE, THYROIDAB in the last 72 hours. Anemia Panel: No results for input(s): VITAMINB12, FOLATE, FERRITIN, TIBC, IRON, RETICCTPCT in the last 72 hours. Sepsis Labs: No results for input(s): PROCALCITON, LATICACIDVEN in the last 168 hours.  No results found for this or any previous visit (from the past 240 hours).       Radiology Studies: No results found.         LOS: 119  days   Time spent= 35 mins    Burgess JAYSON Dare, MD [facility]  If 7PM-7AM, please contact night-coverage  01/30/2024, 10:11 AM  "

## 2024-01-30 NOTE — Plan of Care (Signed)
  Problem: Clinical Measurements: Goal: Diagnostic test results will improve Outcome: Progressing Goal: Respiratory complications will improve Outcome: Progressing Goal: Cardiovascular complication will be avoided Outcome: Progressing   Problem: Activity: Goal: Risk for activity intolerance will decrease Outcome: Progressing   Problem: Nutrition: Goal: Adequate nutrition will be maintained Outcome: Progressing   Problem: Coping: Goal: Level of anxiety will decrease Outcome: Progressing   Problem: Elimination: Goal: Will not experience complications related to bowel motility Outcome: Progressing Goal: Will not experience complications related to urinary retention Outcome: Progressing   Problem: Pain Managment: Goal: General experience of comfort will improve and/or be controlled Outcome: Progressing   Problem: Safety: Goal: Ability to remain free from injury will improve Outcome: Progressing   Problem: Skin Integrity: Goal: Risk for impaired skin integrity will decrease Outcome: Progressing

## 2024-01-31 DIAGNOSIS — K7290 Hepatic failure, unspecified without coma: Secondary | ICD-10-CM | POA: Diagnosis not present

## 2024-01-31 DIAGNOSIS — K7460 Unspecified cirrhosis of liver: Secondary | ICD-10-CM | POA: Diagnosis not present

## 2024-01-31 NOTE — Progress Notes (Signed)
 " PROGRESS NOTE    Joan Wood  FMW:[id number] DOB: 18-Nov-1970 DOA: 10/03/2023 PCP: Edman Meade PEDLAR, FNP    Brief Narrative:  54 year old with history of decompensated liver cirrhosis, PUD, gastric ulcer perforation requiring exploratory laparotomy, GERD, pancreatitis, OSA admitted initially for coffee-ground emesis and bloody diarrhea.  Consulted GI underwent endoscopy which revealed nonbleeding ulcer and esophagitis and some concerns of esophageal necrosis.  This was treated by GI team and IR had also been consulted.  Hospital course also complicated by hepatic encephalopathy requiring lactulose  and rifaximin .  Intermittently that she requires paracentesis  Ongoing safe disposition at this time   Assessment & Plan:  Decompensated alcoholic liver cirrhosis Hepatic encephalopathy Recurrent ascites -Earlier in the admission patient was seen by gastroenterology and had undergone extensive workup including endoscopy, received NAC protocol for 5 days.  and rifaximin  along with PPI.  Nothing further to add from gastroenterology standpoint besides outpatient follow-up therefore their service had signed off. -IR is performing periodic paracentesis -Currently on Lasix , Aldactone , lactulose  -Reduce midodrine  20mg  > 10mg  tid.   Cold sores  - improved with acyclovir    Left eye stye No associated erythema or eye pain - completed erythromycin  ointments   Left torso folliculitis/boil - Ordered topical mupirocin    AKI -Secondary to ATN from contrast, renal function back to baseline   Bilateral sacral insufficiency fracture - Seen on the CT scan.  Supportive care, pain control   Appendicitis  -Earlier during the admission, general surgery recommended conservative management   Hypokalemia Hypomagnesemia - Replete as needed   Chronic hyponatremia  -Secondary to underlying cirrhosis   ACute upper GI bleed S/p a total of 4 units of PRBC transfusion in Sept-Nov 2.26. Hb now has been  stable.  -By GI had undergone endoscopy earlier in admission.  Continue PPI    Lower extremity edema -Secondary to hypoalbuminemia from liver disease   Anxiety -Continue with hydroxyzine    History of tobacco abuse -Refusing nicotine  patch   Chronic pain syndrome - cymbalta  relatively contraindicated with her liver disease.   Severe malnutrition Dietitian recommendations (11/11): -Oral supplements   Pressure injury -Mid sacrum. Unclear if present on admission.    Goals of care Patient has been seen by palliative care and continues to desire full scope medical care.   Periodic lab work as needed  DVT prophylaxis: SCDs Start: 10/03/23 2153    Code Status: Full Code Family Communication:   Status is: Inpatient Remains inpatient appropriate because: Placement   PT Follow up Recs: Recommend Snf, Barriers To Snf Placement - Toc To F/U With Patient/Family For D/C Plans (If Family/Friends Are Able To Coordinate 24/7 Support, Pt Would Be Able To Return Home With Hh Vs. Op Pt For Continued Balance Training)01/27/2024 1439  Subjective: No complaints  Examination:  General exam: Appears calm and comfortable  Respiratory system: Clear to auscultation. Respiratory effort normal. Cardiovascular system: S1 & S2 heard, RRR. No JVD, murmurs, rubs, gallops or clicks. No pedal edema. Gastrointestinal system: Abdomen is nondistended, soft and nontender. No organomegaly or masses felt. Normal bowel sounds heard. Central nervous system: Alert and oriented. No focal neurological deficits. Extremities: Symmetric 5 x 5 power. Skin: No rashes, lesions or ulcers Psychiatry: Judgement and insight appear normal. Mood & affect appropriate.                Diet Orders (From admission, onward)     Start     Ordered   11/23/23 1202  Diet 2 gram sodium Room service appropriate?  Yes with Assist; Fluid consistency: Thin; Fluid restriction: 1500 mL Fluid  Diet effective now       Question  Answer Comment  Room service appropriate? Yes with Assist   Fluid consistency: Thin   Fluid restriction: 1500 mL Fluid      11/23/23 1202            Objective: Vitals:   01/30/24 1950 01/31/24 0403 01/31/24 0422 01/31/24 0748  BP: (!) 142/79 113/67  134/79  Pulse: 92 (!) 102  (!) 106  Resp: 17   19  Temp: 98 F (36.7 C) 98.2 F (36.8 C)  98.1 F (36.7 C)  TempSrc: Oral Oral  Oral  SpO2: 99% 98%  97%  Weight:   57.5 kg   Height:        Intake/Output Summary (Last 24 hours) at 01/31/2024 1041 Last data filed at 01/31/2024 0121 Gross per 24 hour  Intake 660 ml  Output --  Net 660 ml   Filed Weights   01/22/24 0354 01/26/24 0500 01/31/24 0422  Weight: 61.7 kg 58.7 kg 57.5 kg    Scheduled Meds:  sodium chloride    Intravenous Once   calcium  carbonate  1 tablet Oral TID WC   feeding supplement  237 mL Oral TID BM   folic acid   1 mg Oral Daily   furosemide   40 mg Oral BID   gabapentin   600 mg Oral TID   lactulose   30 g Oral TID   lidocaine   2 patch Transdermal Q24H   lidocaine -EPINEPHrine   20 mL Intradermal Once   magnesium  oxide  400 mg Oral BID   midodrine   10 mg Oral TID WC   multivitamin with minerals  1 tablet Oral Daily   nutrition supplement (JUVEN)  1 packet Oral BID BM   pantoprazole   40 mg Oral BID   potassium chloride   20 mEq Oral Daily   QUEtiapine   25 mg Oral QHS   spironolactone   50 mg Oral Daily   thiamine   100 mg Oral Daily   triamcinolone  0.1 % cream : eucerin   Topical Daily   Continuous Infusions:  Nutritional status Signs/Symptoms: severe fat depletion, severe muscle depletion Interventions: Ensure Enlive (each supplement provides 350kcal and 20 grams of protein), MVI, Liberalize Diet, Magic cup Body mass index is 23.19 kg/m.  Data Reviewed:   CBC: No results for input(s): WBC, NEUTROABS, HGB, HCT, MCV, PLT in the last 168 hours. Basic Metabolic Panel: Recent Labs  Lab 01/27/24 0425 01/29/24 0430 01/30/24 0238  NA  134* 129* 133*  K 3.3* 3.2* 3.9  CL 96* 93* 98  CO2 27 26 26   GLUCOSE 75 84 94  BUN 12 8 8   CREATININE 0.68 0.62 0.63  CALCIUM  8.1* 8.1* 8.8*  MG 1.8 1.8  --    GFR: Estimated Creatinine Clearance: 63.6 mL/min (by C-G formula based on SCr of 0.63 mg/dL). Liver Function Tests: Recent Labs  Lab 01/30/24 0238  AST 56*  ALT 26  ALKPHOS 112  BILITOT 0.8  PROT 7.4  ALBUMIN  2.8*   No results for input(s): LIPASE, AMYLASE in the last 168 hours. No results for input(s): AMMONIA in the last 168 hours. Coagulation Profile: No results for input(s): INR, PROTIME in the last 168 hours. Cardiac Enzymes: No results for input(s): CKTOTAL, CKMB, CKMBINDEX, TROPONINI in the last 168 hours. BNP (last 3 results) No results for input(s): PROBNP in the last 8760 hours. HbA1C: No results for input(s): HGBA1C in the last 72 hours. CBG:  No results for input(s): GLUCAP in the last 168 hours. Lipid Profile: No results for input(s): CHOL, HDL, LDLCALC, TRIG, CHOLHDL, LDLDIRECT in the last 72 hours. Thyroid  Function Tests: No results for input(s): TSH, T4TOTAL, FREET4, T3FREE, THYROIDAB in the last 72 hours. Anemia Panel: No results for input(s): VITAMINB12, FOLATE, FERRITIN, TIBC, IRON, RETICCTPCT in the last 72 hours. Sepsis Labs: No results for input(s): PROCALCITON, LATICACIDVEN in the last 168 hours.  No results found for this or any previous visit (from the past 240 hours).       Radiology Studies: No results found.         LOS: 120 days   Time spent= 35 mins    Burgess JAYSON Dare, MD [facility]  If 7PM-7AM, please contact night-coverage  01/31/2024, 10:41 AM  "

## 2024-01-31 NOTE — Plan of Care (Signed)
 Ambulating with staff today. Frequent visits to bathroom. Son and daughter in to visit today. Meds as ordered.     Activity: Goal: Risk for activity intolerance will decrease Outcome: Progressing   Problem: Nutrition: Goal: Adequate nutrition will be maintained Outcome: Progressing   Problem: Pain Managment: Goal: General experience of comfort will improve and/or be controlled Outcome: Progressing   Problem: Safety: Goal: Ability to remain free from injury will improve Outcome: Progressing

## 2024-01-31 NOTE — TOC Progression Note (Signed)
 Transition of Care [facility]) - Progression Note    Patient Details  Name: FAITHANNE VERRET MRN: [id number] Date of Birth: Oct 13, 1970  [facility]) CM/SW Contact  [name], [name] Phone Number: 01/31/2024, 4:05 PM  Clinical Narrative:   CSW met with patient this morning, who was again asking for CSW to reach out to her family about taking her home. CSW later met with patient's children, [name] and Porum, at bedside. CSW confirmed with patient's children that they are unable to take the patient and provide the care that she needs. Patient does not agree that she requires supervision or someone to manage her medications, but children are in agreement with the recommendations from the medical team. Patient provided contact information for her disability worker who had reached out Micheal [phone], x [phone]). CSW called and spoke with Monique to provide contact information if anything is needed for patient's disability application. Nothing needed at this time, it is still in process. CSW met with patient to confirm with her that CSW spoke with [facility] and there is nothing else to do at this time. CSW to follow.    Expected Discharge Plan: Skilled Nursing Facility Barriers to Discharge: Homeless with medical needs, Inadequate or no insurance, Continued Medical Work up, Unsafe home situation               Expected Discharge Plan and Services In-house Referral: Clinical Social Work Discharge Planning Services: CM Consult Post Acute Care Choice: Home Health Living arrangements for the past 2 months: Single Family Home                 DME Arranged: Walker rolling   Date DME Agency Contacted: 10/08/23   Representative spoke with at DME Agency: [name]             Social Drivers of Health (SDOH) Interventions SDOH Screenings   Food Insecurity: No Food Insecurity (10/03/2023)  Housing: Low Risk (10/03/2023)  Transportation Needs: No Transportation Needs (10/03/2023)   Recent Concern: Transportation Needs - Unmet Transportation Needs (08/20/2023)  Utilities: Not At Risk (10/03/2023)  Depression (PHQ2-9): High Risk (07/30/2023)  Social Connections: Socially Isolated (10/03/2023)  Tobacco Use: High Risk (10/04/2023)    Readmission Risk Interventions    10/04/2023    9:02 AM 09/03/2023   12:31 PM 09/02/2023    9:01 AM  Readmission Risk Prevention Plan  Transportation Screening Complete Complete Complete  PCP or Specialist Appt within 3-5 Days   Complete  HRI or Home Care Consult   Complete  Social Work Consult for Recovery Care Planning/Counseling   Complete  Palliative Care Screening   Not Applicable  Medication Review Oceanographer) Complete Complete Complete  HRI or Home Care Consult Complete Complete   SW Recovery Care/Counseling Consult Complete Complete   Palliative Care Screening Not Applicable Not Applicable   Skilled Nursing Facility Not Applicable Patient Refused

## 2024-01-31 NOTE — Progress Notes (Signed)
 I spoke to the patients son, [name], very extensively and answered all the medical questions. He is actually very well understanding of his mom's situation and knows her liver condition is advance. Although she currently is not in any life threatening situation, he knows that with the liver disease her condition can deteriorate anytime and medical compliance of utmost importance.  Unfortunately him (lives in [place]) or his sister (Living in [place]) are unable to provide support that she needs at home. Therefore our TOC is working with the patient for safe placement.  Family prefers a facility either closer to [place] or [place].   Burgess Dare MD

## 2024-01-31 NOTE — Plan of Care (Signed)
  Problem: Clinical Measurements: Goal: Diagnostic test results will improve Outcome: Progressing Goal: Respiratory complications will improve Outcome: Progressing Goal: Cardiovascular complication will be avoided Outcome: Progressing   Problem: Activity: Goal: Risk for activity intolerance will decrease Outcome: Progressing   Problem: Nutrition: Goal: Adequate nutrition will be maintained Outcome: Progressing   Problem: Elimination: Goal: Will not experience complications related to bowel motility Outcome: Progressing Goal: Will not experience complications related to urinary retention Outcome: Progressing   Problem: Pain Managment: Goal: General experience of comfort will improve and/or be controlled Outcome: Progressing   Problem: Safety: Goal: Ability to remain free from injury will improve Outcome: Progressing   Problem: Skin Integrity: Goal: Risk for impaired skin integrity will decrease Outcome: Progressing

## 2024-02-01 DIAGNOSIS — K7460 Unspecified cirrhosis of liver: Secondary | ICD-10-CM | POA: Diagnosis not present

## 2024-02-01 DIAGNOSIS — K7290 Hepatic failure, unspecified without coma: Secondary | ICD-10-CM | POA: Diagnosis not present

## 2024-02-01 LAB — PROTIME-INR
INR: 1.3 — ABNORMAL HIGH (ref 0.8–1.2)
Prothrombin Time: 16.7 s — ABNORMAL HIGH (ref 11.4–15.2)

## 2024-02-01 NOTE — Progress Notes (Signed)
 She turned bed alarm off again. Educated and refusing to stop at this time. Continue to monitor. Doctor notified.

## 2024-02-01 NOTE — Plan of Care (Signed)
 Continues to turn bed alarm off after educating about safety. She f=recalls the management team discussing bed alarms.    Problem: Clinical Measurements: Goal: Diagnostic test results will improve Outcome: Progressing   Problem: Activity: Goal: Risk for activity intolerance will decrease Outcome: Progressing   Problem: Coping: Goal: Level of anxiety will decrease Outcome: Progressing   Problem: Elimination: Goal: Will not experience complications related to urinary retention Outcome: Progressing   Problem: Safety: Goal: Ability to remain free from injury will improve Outcome: Progressing   Problem: Skin Integrity: Goal: Risk for impaired skin integrity will decrease Outcome: Progressing

## 2024-02-01 NOTE — Progress Notes (Signed)
 She just turned off bed alarm again. Had conversation about this being incorrect. SW standing at desk and aware of her doing this again.

## 2024-02-01 NOTE — Plan of Care (Addendum)
"   Patient was educated regarding fluid restrictions multiple times. She was given fluids with scheduled and PRN medications and in between to take cautiously. But if patient was not given something to drink she is threatening to take water from the faucet. When the nurse enters her room she was taking water from faucet to drink. Explained to patient consequences of her actions.  Problem: Clinical Measurements: Goal: Diagnostic test results will improve Outcome: Progressing Goal: Respiratory complications will improve Outcome: Progressing Goal: Cardiovascular complication will be avoided Outcome: Progressing   Problem: Activity: Goal: Risk for activity intolerance will decrease Outcome: Progressing   Problem: Nutrition: Goal: Adequate nutrition will be maintained Outcome: Progressing   Problem: Elimination: Goal: Will not experience complications related to bowel motility Outcome: Progressing Goal: Will not experience complications related to urinary retention Outcome: Progressing   Problem: Pain Managment: Goal: General experience of comfort will improve and/or be controlled Outcome: Progressing   Problem: Safety: Goal: Ability to remain free from injury will improve Outcome: Progressing   Problem: Skin Integrity: Goal: Risk for impaired skin integrity will decrease Outcome: Progressing   "

## 2024-02-01 NOTE — Progress Notes (Signed)
 Turning off bed alarm and ambulating independently. Advised not to turn off alarm please. Told this survey was completed by the upper level management and we have to follow the policy. Walked back to room and bed alarm turned back on.

## 2024-02-01 NOTE — Progress Notes (Signed)
 " PROGRESS NOTE    Joan Wood  FMW:[id number] DOB: 1970/01/24 DOA: 10/03/2023 PCP: Edman Meade PEDLAR, FNP    Brief Narrative:  54 year old with history of decompensated liver cirrhosis, PUD, gastric ulcer perforation requiring exploratory laparotomy, GERD, pancreatitis, OSA admitted initially for coffee-ground emesis and bloody diarrhea.  Consulted GI underwent endoscopy which revealed nonbleeding ulcer and esophagitis and some concerns of esophageal necrosis.  This was treated by GI team and IR had also been consulted.  Hospital course also complicated by hepatic encephalopathy requiring lactulose  and rifaximin .  Intermittently that she requires paracentesis  Ongoing safe disposition at this time   Assessment & Plan:  Decompensated alcoholic liver cirrhosis Hepatic encephalopathy Recurrent ascites -Earlier in the admission patient was seen by gastroenterology and had undergone extensive workup including endoscopy, received NAC protocol for 5 days.  and rifaximin  along with PPI.  Nothing further to add from gastroenterology standpoint besides outpatient follow-up therefore their service had signed off. -IR is performing periodic paracentesis -Currently on Lasix , Aldactone , lactulose  -Reduce midodrine  20mg  > 10mg  tid.   Cold sores  - improved with acyclovir    Left eye stye No associated erythema or eye pain - completed erythromycin  ointments   Left torso folliculitis/boil - Ordered topical mupirocin    AKI -Secondary to ATN from contrast, renal function back to baseline   Bilateral sacral insufficiency fracture - Seen on the CT scan.  Supportive care, pain control   Appendicitis  -Earlier during the admission, general surgery recommended conservative management   Hypokalemia Hypomagnesemia - Replete as needed   Chronic hyponatremia  -Secondary to underlying cirrhosis   ACute upper GI bleed S/p a total of 4 units of PRBC transfusion in Sept-Nov 2.26. Hb now has been  stable.  -By GI had undergone endoscopy earlier in admission.  Continue PPI    Lower extremity edema -Secondary to hypoalbuminemia from liver disease   Anxiety -Continue with hydroxyzine    History of tobacco abuse -Refusing nicotine  patch   Chronic pain syndrome - cymbalta  relatively contraindicated with her liver disease.   Severe malnutrition Dietitian recommendations (11/11): -Oral supplements   Pressure injury -Mid sacrum. Unclear if present on admission.    Goals of care Patient has been seen by palliative care and continues to desire full scope medical care.   Periodic lab work as needed  DVT prophylaxis: SCDs Start: 10/03/23 2153    Code Status: Full Code Family Communication: I had extensive discussion with patient's son on 1/22. Status is: Inpatient Remains inpatient appropriate because: Placement   PT Follow up Recs: Recommend Snf, Barriers To Snf Placement - Toc To F/U With Patient/Family For D/C Plans (If Family/Friends Are Able To Coordinate 24/7 Support, Pt Would Be Able To Return Home With Hh Vs. Op Pt For Continued Balance Training)01/27/2024 1439  Subjective: No complaints Eating her breakfast  Examination:  General exam: Appears calm and comfortable  Respiratory system: Clear to auscultation. Respiratory effort normal. Cardiovascular system: S1 & S2 heard, RRR. No JVD, murmurs, rubs, gallops or clicks. No pedal edema. Gastrointestinal system: Abdomen is nondistended, soft and nontender. No organomegaly or masses felt. Normal bowel sounds heard. Central nervous system: Alert and oriented. No focal neurological deficits. Extremities: Symmetric 5 x 5 power. Skin: No rashes, lesions or ulcers Psychiatry: Judgement and insight appear normal. Mood & affect appropriate.                Diet Orders (From admission, onward)     Start     Ordered  11/23/23 1202  Diet 2 gram sodium Room service appropriate? Yes with Assist; Fluid consistency:  Thin; Fluid restriction: 1500 mL Fluid  Diet effective now       Question Answer Comment  Room service appropriate? Yes with Assist   Fluid consistency: Thin   Fluid restriction: 1500 mL Fluid      11/23/23 1202            Objective: Vitals:   01/31/24 1936 01/31/24 2043 02/01/24 0442 02/01/24 0542  BP: 135/77 138/74 (!) 113/59   Pulse: (!) 102 98 (!) 105   Resp: 17 18 18    Temp: 98.5 F (36.9 C) 98.2 F (36.8 C) 98.6 F (37 C)   TempSrc: Oral Oral Oral   SpO2: 99% 97% 98%   Weight:    56.8 kg  Height:        Intake/Output Summary (Last 24 hours) at 02/01/2024 0805 Last data filed at 02/01/2024 0021 Gross per 24 hour  Intake 797 ml  Output --  Net 797 ml   Filed Weights   01/26/24 0500 01/31/24 0422 02/01/24 0542  Weight: 58.7 kg 57.5 kg 56.8 kg    Scheduled Meds:  sodium chloride    Intravenous Once   calcium  carbonate  1 tablet Oral TID WC   feeding supplement  237 mL Oral TID BM   folic acid   1 mg Oral Daily   furosemide   40 mg Oral BID   gabapentin   600 mg Oral TID   lactulose   30 g Oral TID   lidocaine   2 patch Transdermal Q24H   lidocaine -EPINEPHrine   20 mL Intradermal Once   magnesium  oxide  400 mg Oral BID   midodrine   10 mg Oral TID WC   multivitamin with minerals  1 tablet Oral Daily   nutrition supplement (JUVEN)  1 packet Oral BID BM   pantoprazole   40 mg Oral BID   potassium chloride   20 mEq Oral Daily   QUEtiapine   25 mg Oral QHS   spironolactone   50 mg Oral Daily   thiamine   100 mg Oral Daily   triamcinolone  0.1 % cream : eucerin   Topical Daily   Continuous Infusions:  Nutritional status Signs/Symptoms: severe fat depletion, severe muscle depletion Interventions: Ensure Enlive (each supplement provides 350kcal and 20 grams of protein), MVI, Liberalize Diet, Magic cup Body mass index is 22.9 kg/m.  Data Reviewed:   CBC: No results for input(s): WBC, NEUTROABS, HGB, HCT, MCV, PLT in the last 168 hours. Basic Metabolic  Panel: Recent Labs  Lab 01/27/24 0425 01/29/24 0430 01/30/24 0238  NA 134* 129* 133*  K 3.3* 3.2* 3.9  CL 96* 93* 98  CO2 27 26 26   GLUCOSE 75 84 94  BUN 12 8 8   CREATININE 0.68 0.62 0.63  CALCIUM  8.1* 8.1* 8.8*  MG 1.8 1.8  --    GFR: Estimated Creatinine Clearance: 63.6 mL/min (by C-G formula based on SCr of 0.63 mg/dL). Liver Function Tests: Recent Labs  Lab 01/30/24 0238  AST 56*  ALT 26  ALKPHOS 112  BILITOT 0.8  PROT 7.4  ALBUMIN  2.8*   No results for input(s): LIPASE, AMYLASE in the last 168 hours. No results for input(s): AMMONIA in the last 168 hours. Coagulation Profile: Recent Labs  Lab 02/01/24 0656  INR 1.3*   Cardiac Enzymes: No results for input(s): CKTOTAL, CKMB, CKMBINDEX, TROPONINI in the last 168 hours. BNP (last 3 results) No results for input(s): PROBNP in the last 8760 hours. HbA1C: No  results for input(s): HGBA1C in the last 72 hours. CBG: No results for input(s): GLUCAP in the last 168 hours. Lipid Profile: No results for input(s): CHOL, HDL, LDLCALC, TRIG, CHOLHDL, LDLDIRECT in the last 72 hours. Thyroid  Function Tests: No results for input(s): TSH, T4TOTAL, FREET4, T3FREE, THYROIDAB in the last 72 hours. Anemia Panel: No results for input(s): VITAMINB12, FOLATE, FERRITIN, TIBC, IRON, RETICCTPCT in the last 72 hours. Sepsis Labs: No results for input(s): PROCALCITON, LATICACIDVEN in the last 168 hours.  No results found for this or any previous visit (from the past 240 hours).       Radiology Studies: No results found.         LOS: 121 days   Time spent= 35 mins    Burgess JAYSON Dare, MD [facility]  If 7PM-7AM, please contact night-coverage  02/01/2024, 8:05 AM  "

## 2024-02-02 DIAGNOSIS — K7290 Hepatic failure, unspecified without coma: Secondary | ICD-10-CM | POA: Diagnosis not present

## 2024-02-02 DIAGNOSIS — K7460 Unspecified cirrhosis of liver: Secondary | ICD-10-CM | POA: Diagnosis not present

## 2024-02-02 MED ORDER — SPIRONOLACTONE 25 MG PO TABS
100.0000 mg | ORAL_TABLET | Freq: Every day | ORAL | Status: AC
  Administered 2024-02-03 – 2024-02-15 (×13): 100 mg via ORAL
  Filled 2024-02-02 (×13): qty 4

## 2024-02-02 MED ORDER — FUROSEMIDE 40 MG PO TABS
40.0000 mg | ORAL_TABLET | Freq: Every day | ORAL | Status: AC
  Administered 2024-02-03 – 2024-02-15 (×13): 40 mg via ORAL
  Filled 2024-02-02 (×13): qty 1

## 2024-02-02 MED ORDER — OXYCODONE HCL 5 MG PO TABS
5.0000 mg | ORAL_TABLET | Freq: Three times a day (TID) | ORAL | Status: DC | PRN
  Administered 2024-02-02 – 2024-02-03 (×3): 5 mg via ORAL
  Filled 2024-02-02 (×3): qty 1

## 2024-02-02 NOTE — Progress Notes (Addendum)
 " PROGRESS NOTE    Joan Wood  FMW:[id number] DOB: [birth year] DOA: 10/03/2023 PCP: Edman Meade PEDLAR, FNP   Brief Narrative:  54 year old with history of decompensated liver cirrhosis, PUD, gastric ulcer perforation requiring exploratory laparotomy, GERD, pancreatitis, OSA admitted initially for coffee-ground emesis and bloody diarrhea.  Consulted GI underwent endoscopy which revealed nonbleeding ulcer and esophagitis and some concerns of esophageal necrosis.  This was treated by GI team and IR had also been consulted.  Hospital course also complicated by hepatic encephalopathy requiring lactulose  and rifaximin .  Intermittently that she requires paracentesis   Ongoing safe disposition at this time     Assessment & Plan:   Decompensated alcoholic liver cirrhosis Hepatic encephalopathy Recurrent ascites -Earlier in the admission patient was seen by gastroenterology and had undergone extensive workup including endoscopy, received NAC protocol for 5 days.  and rifaximin  along with PPI.  Nothing further to add from gastroenterology standpoint besides outpatient follow-up therefore their service had signed off. -IR is performing periodic paracentesis -Currently on Lasix , Aldactone , lactulose . Currently on 40 bid of lasix  and spiro 50, which is atypical, and has been mildly hypokalemic. Will make lasix  once a day and increase spiro to 100. Would repeat bmp on 1/26 -Reduced midodrine  20mg  > 10mg  tid, continue to wean as able   HSV labialis - improved with acyclovir    Left eye stye No associated erythema or eye pain - completed erythromycin  ointments    Left torso folliculitis/boil - Ordered topical mupirocin    AKI -Secondary to ATN from contrast, resolved   Bilateral sacral insufficiency fracture - Seen on the CT scan.  Supportive care, pain control   Appendicitis  -Earlier during the admission, general surgery recommended conservative management. Resolved    Hypokalemia Hypomagnesemia - Replete as needed   Chronic hyponatremia  -Secondary to underlying cirrhosis. Stable in the low 130s   ACute upper GI bleed S/p a total of 4 units of PRBC transfusion in Sept-Nov 2.26. Hb now has been stable.  -By GI had undergone endoscopy earlier in admission.  Continue PPI    Lower extremity edema -Secondary to hypoalbuminemia from liver disease   Anxiety -Continue with hydroxyzine    History of tobacco abuse -Refusing nicotine  patch   Chronic pain syndrome - cymbalta  relatively contraindicated with her liver disease. - confirmed with pharmacy today that patient was not on chronic opioids prior to arrival. Has been maintained on those here for several months now. Will plan to start weaning. Oxy currently ordered q4 prn and has been receiving 3-4 times daily, will space out to q8 prn, and go from there.   Severe malnutrition Dietitian recommendations (11/11): -Oral supplements   Pressure injury -Mid sacrum. Unclear if present on admission.    Goals of care Patient has been seen by palliative care and continues to desire full scope medical care.    Periodic lab work as needed   DVT prophylaxis: SCDs Start: 10/03/23 2153    Code Status: Full Code Family Communication: prior hospitalist had extensive discussion with patient's son on 1/22. Status is: Inpatient Remains inpatient appropriate because: Placement     PT Follow up Recs: Recommend Snf, Barriers To Snf Placement - Toc To F/U With Patient/Family For D/C Plans (If Family/Friends Are Able To Coordinate 24/7 Support, Pt Would Be Able To Return Home With Hh Vs. Op Pt For Continued Balance Training)01/27/2024 1439   Subjective: No complaints Tolerating po   Examination:   General exam: Appears calm and comfortable  Respiratory system: Clear to  auscultation. Respiratory effort normal. Cardiovascular system: S1 & S2 heard, RRR.   Gastrointestinal system: Abdomen is mildly distended,  soft and nontender.   Central nervous system: Alert and oriented. No focal neurological deficits. Extremities: Symmetric 5 x 5 power. Skin: No visible rashes, lesions or ulcers Psychiatry: calm                   Diet Orders (From admission, onward)     Start     Ordered   11/23/23 1202  Diet 2 gram sodium Room service appropriate? Yes with Assist; Fluid consistency: Thin; Fluid restriction: 1500 mL Fluid  Diet effective now       Question Answer Comment  Room service appropriate? Yes with Assist   Fluid consistency: Thin   Fluid restriction: 1500 mL Fluid      11/23/23 1202            Objective: Vitals:   02/01/24 1800 02/01/24 2030 02/02/24 0009 02/02/24 0823  BP: (!) 145/87 (!) 142/84 (!) 140/92 117/66  Pulse: 96 89 96 90  Resp:  18 18 18   Temp:  98.6 F (37 C) 98.7 F (37.1 C) 98.7 F (37.1 C)  TempSrc:  Oral Oral Oral  SpO2:  99% 100% 98%  Weight:      Height:       No intake or output data in the 24 hours ending 02/02/24 1111  Filed Weights   01/26/24 0500 01/31/24 0422 02/01/24 0542  Weight: 58.7 kg 57.5 kg 56.8 kg    Scheduled Meds:  sodium chloride    Intravenous Once   calcium  carbonate  1 tablet Oral TID WC   feeding supplement  237 mL Oral TID BM   folic acid   1 mg Oral Daily   furosemide   40 mg Oral BID   gabapentin   600 mg Oral TID   lactulose   30 g Oral TID   lidocaine   2 patch Transdermal Q24H   lidocaine -EPINEPHrine   20 mL Intradermal Once   magnesium  oxide  400 mg Oral BID   midodrine   10 mg Oral TID WC   multivitamin with minerals  1 tablet Oral Daily   nutrition supplement (JUVEN)  1 packet Oral BID BM   pantoprazole   40 mg Oral BID   potassium chloride   20 mEq Oral Daily   QUEtiapine   25 mg Oral QHS   spironolactone   50 mg Oral Daily   thiamine   100 mg Oral Daily   triamcinolone  0.1 % cream : eucerin   Topical Daily   Continuous Infusions:  Nutritional status Signs/Symptoms: severe fat depletion, severe muscle  depletion Interventions: Ensure Enlive (each supplement provides 350kcal and 20 grams of protein), MVI, Liberalize Diet, Magic cup Body mass index is 22.9 kg/m.  Data Reviewed:   CBC: No results for input(s): WBC, NEUTROABS, HGB, HCT, MCV, PLT in the last 168 hours. Basic Metabolic Panel: Recent Labs  Lab 01/27/24 0425 01/29/24 0430 01/30/24 0238  NA 134* 129* 133*  K 3.3* 3.2* 3.9  CL 96* 93* 98  CO2 27 26 26   GLUCOSE 75 84 94  BUN 12 8 8   CREATININE 0.68 0.62 0.63  CALCIUM  8.1* 8.1* 8.8*  MG 1.8 1.8  --    GFR: Estimated Creatinine Clearance: 63.6 mL/min (by C-G formula based on SCr of 0.63 mg/dL). Liver Function Tests: Recent Labs  Lab 01/30/24 0238  AST 56*  ALT 26  ALKPHOS 112  BILITOT 0.8  PROT 7.4  ALBUMIN  2.8*   No  results for input(s): LIPASE, AMYLASE in the last 168 hours. No results for input(s): AMMONIA in the last 168 hours. Coagulation Profile: Recent Labs  Lab 02/01/24 0656  INR 1.3*   Cardiac Enzymes: No results for input(s): CKTOTAL, CKMB, CKMBINDEX, TROPONINI in the last 168 hours. BNP (last 3 results) No results for input(s): PROBNP in the last 8760 hours. HbA1C: No results for input(s): HGBA1C in the last 72 hours. CBG: No results for input(s): GLUCAP in the last 168 hours. Lipid Profile: No results for input(s): CHOL, HDL, LDLCALC, TRIG, CHOLHDL, LDLDIRECT in the last 72 hours. Thyroid  Function Tests: No results for input(s): TSH, T4TOTAL, FREET4, T3FREE, THYROIDAB in the last 72 hours. Anemia Panel: No results for input(s): VITAMINB12, FOLATE, FERRITIN, TIBC, IRON, RETICCTPCT in the last 72 hours. Sepsis Labs: No results for input(s): PROCALCITON, LATICACIDVEN in the last 168 hours.  No results found for this or any previous visit (from the past 240 hours).       Radiology Studies: No results found.         LOS: 122 days   Time spent= 35  mins    Devaughn KATHEE Ban, MD [facility]  If 7PM-7AM, please contact night-coverage  02/02/2024, 11:11 AM  "

## 2024-02-02 NOTE — Plan of Care (Signed)
" °  Problem: Clinical Measurements: Goal: Diagnostic test results will improve Outcome: Progressing Goal: Respiratory complications will improve Outcome: Progressing Goal: Cardiovascular complication will be avoided Outcome: Progressing   Problem: Activity: Goal: Risk for activity intolerance will decrease Outcome: Progressing   Problem: Nutrition: Goal: Adequate nutrition will be maintained Outcome: Progressing   Problem: Coping: Goal: Level of anxiety will decrease Outcome: Progressing   Problem: Elimination: Goal: Will not experience complications related to bowel motility Outcome: Progressing Goal: Will not experience complications related to urinary retention Outcome: Progressing   Problem: Pain Managment: Goal: General experience of comfort will improve and/or be controlled Outcome: Progressing   Problem: Safety: Goal: Ability to remain free from injury will improve Outcome: Progressing   Problem: Skin Integrity: Goal: Risk for impaired skin integrity will decrease Outcome: Progressing   Problem: Clinical Measurements: Goal: Diagnostic test results will improve Outcome: Progressing Goal: Respiratory complications will improve Outcome: Progressing Goal: Cardiovascular complication will be avoided Outcome: Progressing   Problem: Activity: Goal: Risk for activity intolerance will decrease Outcome: Progressing   Problem: Nutrition: Goal: Adequate nutrition will be maintained Outcome: Progressing   Problem: Coping: Goal: Level of anxiety will decrease Outcome: Progressing   Problem: Elimination: Goal: Will not experience complications related to bowel motility Outcome: Progressing Goal: Will not experience complications related to urinary retention Outcome: Progressing   Problem: Pain Managment: Goal: General experience of comfort will improve and/or be controlled Outcome: Progressing   Problem: Safety: Goal: Ability to remain free from injury will  improve Outcome: Progressing   Problem: Skin Integrity: Goal: Risk for impaired skin integrity will decrease Outcome: Progressing   "

## 2024-02-02 NOTE — Progress Notes (Signed)
 Pt frequently getting out of bed and turning bed alarm off, attempted to educate about safety and pt unwilling to acknowledge information.Reminded pt that she has a fluid restriction, but frequently asks for diet soft drinks and cups of ice. Will continue to monitor

## 2024-02-03 DIAGNOSIS — K7460 Unspecified cirrhosis of liver: Secondary | ICD-10-CM | POA: Diagnosis not present

## 2024-02-03 DIAGNOSIS — K7290 Hepatic failure, unspecified without coma: Secondary | ICD-10-CM | POA: Diagnosis not present

## 2024-02-03 MED ORDER — OXYCODONE HCL 5 MG PO TABS
5.0000 mg | ORAL_TABLET | Freq: Four times a day (QID) | ORAL | Status: DC | PRN
  Administered 2024-02-03 – 2024-02-05 (×8): 5 mg via ORAL
  Filled 2024-02-03 (×8): qty 1

## 2024-02-03 NOTE — Progress Notes (Signed)
 " Progress Note   Patient: Joan Wood FMW:[id number] DOB: Sep 10, 1970 DOA: 10/03/2023     123 DOS: the patient was seen and examined on 02/03/2024   Brief hospital course: Brief Narrative:  55 year old with history of decompensated liver cirrhosis, PUD, gastric ulcer perforation requiring exploratory laparotomy, GERD, pancreatitis, OSA admitted initially for coffee-ground emesis and bloody diarrhea.  Consulted GI underwent endoscopy which revealed nonbleeding ulcer and esophagitis and some concerns of esophageal necrosis.  This was treated by GI team and IR had also been consulted.  Hospital course also complicated by hepatic encephalopathy requiring lactulose  and rifaximin .  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  This is felt to be likely alcoholic cirrhosis with no clear other etiology. - Intermittently every 1 to 2 weeks requires paracentesis from IR, will reorder today - Continue lactulose  30 g 3 times daily - Rifaximin  started today - Continue furosemide  40 mg daily, spironolactone  100 mg daily  #) Chronic hypotension: Presumed secondary from cirrhosis and HRS physiology - Continue midodrine  10 mg 3 times daily  #) Electrolyte derangements: Secondary to diuretics and cirrhosis - On oral magnesium  oxide 4 mg twice daily - Continue potassium chloride  20 mill equivalents daily  #) Chronic hyponatremia: Moderate, serum sodium hovers around mid 120s - Fluid restrict to 1.5 L and continue midodrine  acute GI bleed: This occurred from September to November requiring PRBCs.  Hemoglobin stable. - GI performed endoscopy - Continue PPI  #) Cold sores: Improved with acyclovir   #) AKI: Likely from contrast, resolved  #) Appendicitis: Much earlier in the admission.  Treated supportively with  antibiotics.  Resolved.  #) Chronic pain: # Continue oxycodone  5 mg every 6 hours as needed - Continue gabapentin  600 mg 3 times daily - Continue as needed hydroxyzine  - Continue quetiapine  25 mg nightly  # )alcohol  abuse: - Continue thiamine , folate, multivitamin  #) Severe malnutrition: On oral supplementation  Fluids: None Electrolytes: Monitor and supplement Nutrition: Noted to have severe malnutrition, supplements  Prophylaxis: Enoxaparin   Disposition: Needs skilled nursing facility, will continue to discuss with family but they are unable to provide 24 hours care  Full code, met with palliative and wants to pursue all options   General exam: Appears calm and comfortable  Respiratory system: Clear to auscultation. Respiratory effort normal. Cardiovascular system: S1 & S2 heard, RRR. No JVD, murmurs, rubs, gallops or clicks. No pedal edema. Gastrointestinal system: Abdomen is nondistended, soft and nontender. No organomegaly or masses felt. Normal bowel sounds heard. Central nervous system: Alert and oriented. No focal neurological deficits. Extremities: Symmetric 5 x 5 power. Skin: No rashes, lesions or ulcers Psychiatry: Judgement and insight appear normal. Mood & affect appropriate.   Assessment and Plan: No notes have been filed under this hospital service. Service: Hospitalist       Subjective: Wants to walk more  Physical Exam: Vitals:   02/02/24 2031 02/03/24 0447 02/03/24 0524 02/03/24 0830  BP: 129/74  123/72 109/65  Pulse: 88  98 95  Resp: 18  18 16   Temp: 97.6 F (36.4 C)  98.9 F (37.2 C) 98.8 F (37.1 C)  TempSrc:   Oral Oral  SpO2: 100%  100% 98%  Weight:  56.6 kg    Height:       General exam: Appears calm and comfortable  Respiratory system: Clear  to auscultation. Respiratory effort normal. Cardiovascular system: S1 & S2 heard, RRR. No JVD, murmurs, rubs, gallops or clicks. No pedal edema. Gastrointestinal system: Abdomen is  nondistended, soft and nontender. No organomegaly or masses felt. Normal bowel sounds heard. Central nervous system: Alert and oriented. No focal neurological deficits. Extremities: Symmetric 5 x 5 power. Skin: No rashes, lesions or ulcers Psychiatry: Judgement and insight appear normal. Mood & affect appropriate. Data Reviewed:  There are no new results to review at this time.  Family Communication: Will call family  Disposition: Status is: Inpatient Remains inpatient appropriate because: Placement  Planned Discharge Destination: Skilled nursing facility      Author: Maybelle JAYSON Pelton, MD 02/03/2024 2:29 PM  For on call review [email or web address].  "

## 2024-02-03 NOTE — Progress Notes (Signed)
 Pt refusing to allow bed exit to be activated. Asked multiple times to pull gown up so upper body would not be exposed with door open. Refused to comply and states My nipples aren't showing so I can sit here like this if I want to Prefers that her door stay open. Will continue to monitor

## 2024-02-03 NOTE — Plan of Care (Signed)
  Problem: Clinical Measurements: Goal: Diagnostic test results will improve Outcome: Progressing Goal: Respiratory complications will improve Outcome: Progressing Goal: Cardiovascular complication will be avoided Outcome: Progressing   Problem: Activity: Goal: Risk for activity intolerance will decrease Outcome: Progressing   Problem: Nutrition: Goal: Adequate nutrition will be maintained Outcome: Progressing   Problem: Coping: Goal: Level of anxiety will decrease Outcome: Progressing   Problem: Elimination: Goal: Will not experience complications related to bowel motility Outcome: Progressing Goal: Will not experience complications related to urinary retention Outcome: Progressing   Problem: Pain Managment: Goal: General experience of comfort will improve and/or be controlled Outcome: Progressing   Problem: Safety: Goal: Ability to remain free from injury will improve Outcome: Progressing   Problem: Skin Integrity: Goal: Risk for impaired skin integrity will decrease Outcome: Progressing

## 2024-02-04 ENCOUNTER — Inpatient Hospital Stay (HOSPITAL_COMMUNITY)

## 2024-02-04 DIAGNOSIS — K7290 Hepatic failure, unspecified without coma: Secondary | ICD-10-CM | POA: Diagnosis not present

## 2024-02-04 DIAGNOSIS — K7460 Unspecified cirrhosis of liver: Secondary | ICD-10-CM | POA: Diagnosis not present

## 2024-02-04 LAB — MAGNESIUM: Magnesium: 1.8 mg/dL (ref 1.7–2.4)

## 2024-02-04 LAB — CBC WITH DIFFERENTIAL/PLATELET
Abs Immature Granulocytes: 0 10*3/uL (ref 0.00–0.07)
Basophils Absolute: 0 10*3/uL (ref 0.0–0.1)
Basophils Relative: 0 %
Eosinophils Absolute: 0.1 10*3/uL (ref 0.0–0.5)
Eosinophils Relative: 3 %
HCT: 25.2 % — ABNORMAL LOW (ref 36.0–46.0)
Hemoglobin: 8.3 g/dL — ABNORMAL LOW (ref 12.0–15.0)
Immature Granulocytes: 0 %
Lymphocytes Relative: 22 %
Lymphs Abs: 0.9 10*3/uL (ref 0.7–4.0)
MCH: 27.7 pg (ref 26.0–34.0)
MCHC: 32.9 g/dL (ref 30.0–36.0)
MCV: 84 fL (ref 80.0–100.0)
Monocytes Absolute: 0.8 10*3/uL (ref 0.1–1.0)
Monocytes Relative: 21 %
Neutro Abs: 2.1 10*3/uL (ref 1.7–7.7)
Neutrophils Relative %: 54 %
Platelets: 149 10*3/uL — ABNORMAL LOW (ref 150–400)
RBC: 3 MIL/uL — ABNORMAL LOW (ref 3.87–5.11)
RDW: 14.6 % (ref 11.5–15.5)
WBC: 3.9 10*3/uL — ABNORMAL LOW (ref 4.0–10.5)
nRBC: 0 % (ref 0.0–0.2)

## 2024-02-04 LAB — BASIC METABOLIC PANEL WITH GFR
Anion gap: 8 (ref 5–15)
BUN: 12 mg/dL (ref 6–20)
CO2: 24 mmol/L (ref 22–32)
Calcium: 8.1 mg/dL — ABNORMAL LOW (ref 8.9–10.3)
Chloride: 98 mmol/L (ref 98–111)
Creatinine, Ser: 0.58 mg/dL (ref 0.44–1.00)
GFR, Estimated: >60 mL/min
Glucose, Bld: 90 mg/dL (ref 70–99)
Potassium: 4.1 mmol/L (ref 3.5–5.1)
Sodium: 130 mmol/L — ABNORMAL LOW (ref 135–145)

## 2024-02-04 LAB — PHOSPHORUS: Phosphorus: 4.1 mg/dL (ref 2.5–4.6)

## 2024-02-04 MED ORDER — RIFAXIMIN 550 MG PO TABS
550.0000 mg | ORAL_TABLET | Freq: Two times a day (BID) | ORAL | Status: AC
  Administered 2024-02-04 – 2024-02-15 (×24): 550 mg via ORAL
  Filled 2024-02-04 (×24): qty 1

## 2024-02-04 MED ORDER — LIDOCAINE-EPINEPHRINE 1 %-1:100000 IJ SOLN
INTRAMUSCULAR | Status: AC
  Filled 2024-02-04: qty 20

## 2024-02-04 MED ORDER — ALBUMIN HUMAN 25 % IV SOLN
25.0000 g | Freq: Once | INTRAVENOUS | Status: AC
  Administered 2024-02-04: 25 g via INTRAVENOUS
  Filled 2024-02-04: qty 100

## 2024-02-04 NOTE — Progress Notes (Signed)
 Patient noted upset that she is not able to get OOB independently w/o staffs. RN explained fall risks policy. She expressed understanding, but still shares that she is not happy. Emotional support given. Reassurance shared with Joan Wood that RN will attempt to make all her needs met today along with ambulation in hallway.

## 2024-02-04 NOTE — Progress Notes (Signed)
 " Progress Note   Patient: Joan Wood FMW:[id number] DOB: 10/02/1970 DOA: 10/03/2023     124 DOS: the patient was seen and examined on 02/04/2024   Brief hospital course: Brief Narrative:  54 year old with history of decompensated liver cirrhosis, PUD, gastric ulcer perforation requiring exploratory laparotomy, GERD, pancreatitis, OSA admitted initially for coffee-ground emesis and bloody diarrhea.  Consulted GI underwent endoscopy which revealed nonbleeding ulcer and esophagitis and some concerns of esophageal necrosis.  This was treated by GI team and IR had also been consulted.  Hospital course also complicated by hepatic encephalopathy requiring lactulose  and rifaximin .  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  This is felt to be likely alcoholic cirrhosis with no clear other etiology. 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  Hemoglobin stable. - GI performed endoscopy - Continue PPI  #) Cold sores: Improved with acyclovir   #) AKI: Likely from contrast, resolved  #) Appendicitis: Much  earlier in the admission.  Treated supportively with antibiotics.  Resolved.  #) Chronic pain: # Continue oxycodone  5 mg every 6 hours as needed - Continue gabapentin  600 mg 3 times daily - Continue as needed hydroxyzine  - Continue quetiapine  25 mg nightly  # )alcohol  abuse: - Continue thiamine , folate, multivitamin  #) Severe malnutrition: On oral supplementation  Fluids: None Electrolytes: Monitor and supplement Nutrition: Noted to have severe protein calorie malnutrition, supplements  Prophylaxis: Enoxaparin   Disposition: Needs skilled nursing facility, will continue to discuss with family but they are unable to provide 24 hours care  Full code, met with palliative and wants to pursue all options     Assessment and Plan: No notes have been filed under this hospital service. Service: Hospitalist       Subjective: Doing well after paracentesis  Physical Exam: Vitals:   02/03/24 0447 02/03/24 0524 02/03/24 0830 02/03/24 1920  BP:  123/72 109/65 133/65  Pulse:  98 95 84  Resp:  18 16 18   Temp:  98.9 F (37.2 C) 98.8 F (37.1 C) 98.2 F (36.8 C)  TempSrc:  Oral Oral Oral  SpO2:  100% 98% 97%  Weight: 56.6 kg     Height:       General exam: Appears calm and comfortable  Respiratory system: Clear to auscultation. Respiratory effort normal. Cardiovascular system: S1 & S2 heard, RRR. No JVD, murmurs, rubs, gallops or clicks. No pedal edema. Gastrointestinal system: Soft, less to tenderness distended, normal bowel sounds Central nervous system: Alert and oriented. No focal neurological deficits. Extremities: Symmetric 5 x 5 power. Skin: No rashes, lesions or ulcers Psychiatry: Judgement and insight appear poor. Mood & affect appropriate. Data Reviewed:  There are no new results  to review at this time.  Family Communication: Called family  Disposition: Status is: Inpatient Remains inpatient appropriate because: Placement  Planned Discharge Destination:  Skilled nursing facility      Author: Maybelle JAYSON Pelton, MD 02/04/2024 10:24 AM  For on call review [email or web address].  "

## 2024-02-04 NOTE — Progress Notes (Signed)
 Patient instructed to not get out of bed by herself because she is a high fall risk. Patient verbalized understanding. This RN went back into the patient's room and the patient had cut off her own bed alarm, and walked to the restroom unattended. Patient re-educated on the importance of calling when needing assistance.

## 2024-02-05 DIAGNOSIS — K7290 Hepatic failure, unspecified without coma: Secondary | ICD-10-CM | POA: Diagnosis not present

## 2024-02-05 DIAGNOSIS — K7460 Unspecified cirrhosis of liver: Secondary | ICD-10-CM | POA: Diagnosis not present

## 2024-02-05 MED ORDER — OXYCODONE HCL 5 MG PO TABS
5.0000 mg | ORAL_TABLET | ORAL | Status: AC | PRN
  Administered 2024-02-05 – 2024-02-15 (×50): 5 mg via ORAL
  Filled 2024-02-05 (×51): qty 1

## 2024-02-05 MED ORDER — HYDROXYZINE HCL 25 MG PO TABS
25.0000 mg | ORAL_TABLET | ORAL | Status: AC | PRN
  Administered 2024-02-05 – 2024-02-15 (×33): 25 mg via ORAL
  Filled 2024-02-05 (×33): qty 1

## 2024-02-05 NOTE — Progress Notes (Signed)
 " Progress Note   Patient: Joan Wood FMW:[id number] DOB: [birth year] DOA: 10/03/2023     125 DOS: the patient was seen and examined on 02/05/2024   Brief hospital course: Brief Narrative:  54 year old with history of decompensated liver cirrhosis, PUD, gastric ulcer perforation requiring exploratory laparotomy, GERD, pancreatitis, OSA admitted initially for coffee-ground emesis and bloody diarrhea.  Consulted GI underwent endoscopy which revealed nonbleeding ulcer and esophagitis and some concerns of esophageal necrosis.  This was treated by GI team and IR had also been consulted.  Hospital course also complicated by hepatic encephalopathy requiring lactulose  and rifaximin .  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  This is felt to be likely alcoholic cirrhosis with no clear other etiology. - Intermittently every 1 to 2 weeks requires paracentesis from IR, paracentesis was attempted last on 02/04/2024 without any fluids, hopefully she is better compensated and does not need any more paracentesis can be managed with oral diuretics - Continue lactulose  30 g 3 times daily - Rifaximin  550 mg twice daily - Continue furosemide  40 mg daily, spironolactone  100 mg daily  #) Deconditioning: Patient noted to have severe deconditioning necessitating 24-hour care which is not possible with the family.  She continues to work on physical therapy which she is unable to get adequate physical therapy to her needs.  She did have a fall approximately 1 month ago in December 2025.  Have discussed with the patient risks and benefits and she is more than ready to accept the risks of another fall unsupervised if she is allowed to walk around the unit on with a walker.  She understands these risks can be very severe and  include fractures necessitating surgery, bleeding in the brain and possibly even death though this is unlikely. 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  Hemoglobin stable. - GI performed endoscopy - Continue PPI  #) Cold sores: Improved with acyclovir   #) AKI: Likely from contrast, resolved  #) Appendicitis: Much earlier in the admission.  Treated supportively with antibiotics.  Resolved.  #) Chronic pain: # Continue oxycodone  5 mg every 6 hours as needed - Continue gabapentin  600 mg 3 times daily - Continue as needed hydroxyzine  - Continue quetiapine  25 mg nightly  # )alcohol  abuse: - Continue thiamine , folate, multivitamin  #) Severe malnutrition: On oral supplementation  Fluids: None Electrolytes: Monitor and supplement Nutrition: Noted to have severe protein calorie malnutrition, supplements  Prophylaxis: Enoxaparin   Disposition: Needs skilled nursing facility, will continue to discuss with family but they are unable to provide 24 hours care  Full code, met with palliative and wants to pursue all options     Assessment and Plan: No notes have been filed under this hospital service. Service: Hospitalist       Subjective: Lots  of pain after walking around.  Physical Exam: Vitals:   02/04/24 1853 02/04/24 2020 02/05/24 0006 02/05/24 0806  BP:  129/81 123/70 126/74  Pulse:  88 (!)  101 91  Resp:  18 16 14   Temp:  98.6 F (37 C) 97.9 F (36.6 C) 98.3 F (36.8 C)  TempSrc:  Oral Oral Oral  SpO2: 99% 99% 97% 100%  Weight:      Height:       General exam: Appears calm and comfortable  Respiratory system: Clear to auscultation. Respiratory effort normal. Cardiovascular system: S1 & S2 heard, RRR. No JVD, murmurs, rubs, gallops or clicks. No pedal edema. Gastrointestinal system: Soft, less to tenderness distended, normal bowel sounds Central nervous system: Alert and oriented. No focal neurological deficits. Extremities: Symmetric 5 x 5 power. Skin: No rashes, lesions or ulcers Psychiatry: Judgement and insight appear poor. Mood & affect appropriate. Data Reviewed:  There are no new results to review at this time.  Family Communication: Called family  Disposition: Status is: Inpatient Remains inpatient appropriate because: Placement  Planned Discharge Destination: Skilled nursing facility      Author: Maybelle JAYSON Pelton, MD 02/05/2024 10:34 AM  For on call review [email or web address]  "

## 2024-02-06 DIAGNOSIS — K7290 Hepatic failure, unspecified without coma: Secondary | ICD-10-CM | POA: Diagnosis not present

## 2024-02-06 DIAGNOSIS — K7460 Unspecified cirrhosis of liver: Secondary | ICD-10-CM | POA: Diagnosis not present

## 2024-02-06 LAB — CBC WITH DIFFERENTIAL/PLATELET
Abs Immature Granulocytes: 0.01 10*3/uL (ref 0.00–0.07)
Basophils Absolute: 0 10*3/uL (ref 0.0–0.1)
Basophils Relative: 0 %
Eosinophils Absolute: 0.1 10*3/uL (ref 0.0–0.5)
Eosinophils Relative: 3 %
HCT: 25.4 % — ABNORMAL LOW (ref 36.0–46.0)
Hemoglobin: 8.4 g/dL — ABNORMAL LOW (ref 12.0–15.0)
Immature Granulocytes: 0 %
Lymphocytes Relative: 19 %
Lymphs Abs: 0.8 10*3/uL (ref 0.7–4.0)
MCH: 27.5 pg (ref 26.0–34.0)
MCHC: 33.1 g/dL (ref 30.0–36.0)
MCV: 83.3 fL (ref 80.0–100.0)
Monocytes Absolute: 0.7 10*3/uL (ref 0.1–1.0)
Monocytes Relative: 17 %
Neutro Abs: 2.6 10*3/uL (ref 1.7–7.7)
Neutrophils Relative %: 61 %
Platelets: 147 10*3/uL — ABNORMAL LOW (ref 150–400)
RBC: 3.05 MIL/uL — ABNORMAL LOW (ref 3.87–5.11)
RDW: 14.7 % (ref 11.5–15.5)
WBC: 4.2 10*3/uL (ref 4.0–10.5)
nRBC: 0 % (ref 0.0–0.2)

## 2024-02-06 LAB — COMPREHENSIVE METABOLIC PANEL WITH GFR
ALT: 19 U/L (ref 0–44)
AST: 40 U/L (ref 15–41)
Albumin: 2.8 g/dL — ABNORMAL LOW (ref 3.5–5.0)
Alkaline Phosphatase: 94 U/L (ref 38–126)
Anion gap: 8 (ref 5–15)
BUN: 13 mg/dL (ref 6–20)
CO2: 24 mmol/L (ref 22–32)
Calcium: 8.4 mg/dL — ABNORMAL LOW (ref 8.9–10.3)
Chloride: 99 mmol/L (ref 98–111)
Creatinine, Ser: 0.56 mg/dL (ref 0.44–1.00)
GFR, Estimated: >60 mL/min
Glucose, Bld: 112 mg/dL — ABNORMAL HIGH (ref 70–99)
Potassium: 4.2 mmol/L (ref 3.5–5.1)
Sodium: 131 mmol/L — ABNORMAL LOW (ref 135–145)
Total Bilirubin: 0.6 mg/dL (ref 0.0–1.2)
Total Protein: 7 g/dL (ref 6.5–8.1)

## 2024-02-06 LAB — MAGNESIUM: Magnesium: 1.7 mg/dL (ref 1.7–2.4)

## 2024-02-06 LAB — PHOSPHORUS: Phosphorus: 4.2 mg/dL (ref 2.5–4.6)

## 2024-02-06 NOTE — TOC Progression Note (Signed)
 Transition of Care [facility]) - Progression Note    Patient Details  Name: Joan Wood MRN: [id number] Date of Birth: 30-Jan-1970  Transition of Care [facility]) CM/SW Contact  [name], [name] Phone Number: 02/06/2024, 3:23 PM  Clinical Narrative:   CSW spoke with patient's father, [name], about disposition. Patient's father had been out of the country on vacation, just returned yesterday. Father is unable to take the patient home, and reported that he had not even discussed it with the patient. CSW relayed that the patient was adamant that he would take her, that is why CSW reached out. Father reported that the patient's memory is shot, he came back home to multiple messages from her like she had forgotten that he was out of the country when he had told her that he would be gone. Father indicated that he didn't think anyone in the family would provide care for the patient, that she would have to go into a nursing home. CSW discussed that was in the works, but patient's disability was pending. Father appreciative of CSW assistance. CSW to follow.    Expected Discharge Plan: Skilled Nursing Facility Barriers to Discharge: Homeless with medical needs, Inadequate or no insurance, Continued Medical Work up, Unsafe home situation               Expected Discharge Plan and Services In-house Referral: Clinical Social Work Discharge Planning Services: CM Consult Post Acute Care Choice: Home Health Living arrangements for the past 2 months: Single Family Home                 DME Arranged: Walker rolling   Date DME Agency Contacted: 10/08/23   Representative spoke with at DME Agency: [name]             Social Drivers of Health (SDOH) Interventions SDOH Screenings   Food Insecurity: No Food Insecurity (10/03/2023)  Housing: Low Risk (10/03/2023)  Transportation Needs: No Transportation Needs (10/03/2023)  Recent Concern: Transportation Needs - Unmet Transportation Needs (08/20/2023)   Utilities: Not At Risk (10/03/2023)  Depression (PHQ2-9): High Risk (07/30/2023)  Social Connections: Socially Isolated (10/03/2023)  Tobacco Use: High Risk (10/04/2023)    Readmission Risk Interventions    10/04/2023    9:02 AM 09/03/2023   12:31 PM 09/02/2023    9:01 AM  Readmission Risk Prevention Plan  Transportation Screening Complete Complete Complete  PCP or Specialist Appt within 3-5 Days   Complete  HRI or Home Care Consult   Complete  Social Work Consult for Recovery Care Planning/Counseling   Complete  Palliative Care Screening   Not Applicable  Medication Review Oceanographer) Complete Complete Complete  HRI or Home Care Consult Complete Complete   SW Recovery Care/Counseling Consult Complete Complete   Palliative Care Screening Not Applicable Not Applicable   Skilled Nursing Facility Not Applicable Patient Refused

## 2024-02-06 NOTE — Plan of Care (Signed)
  Problem: Clinical Measurements: Goal: Diagnostic test results will improve Outcome: Progressing Goal: Respiratory complications will improve Outcome: Progressing Goal: Cardiovascular complication will be avoided Outcome: Progressing   Problem: Activity: Goal: Risk for activity intolerance will decrease Outcome: Progressing   Problem: Nutrition: Goal: Adequate nutrition will be maintained Outcome: Progressing   Problem: Coping: Goal: Level of anxiety will decrease Outcome: Progressing   Problem: Elimination: Goal: Will not experience complications related to bowel motility Outcome: Progressing Goal: Will not experience complications related to urinary retention Outcome: Progressing   Problem: Pain Managment: Goal: General experience of comfort will improve and/or be controlled Outcome: Progressing   Problem: Safety: Goal: Ability to remain free from injury will improve Outcome: Progressing   Problem: Skin Integrity: Goal: Risk for impaired skin integrity will decrease Outcome: Progressing

## 2024-02-06 NOTE — Plan of Care (Signed)
 Patient noted upset that she is not able to get out of bed independently without staff. Explained fall risks policy. She verbalized understanding, but still shares that she is not happy. Emotional support given.   Problem: Coping: Goal: Level of anxiety will decrease Outcome: Progressing   Problem: Skin Integrity: Goal: Risk for impaired skin integrity will decrease Outcome: Progressing   Problem: Safety: Goal: Ability to remain free from injury will improve Outcome: Progressing   Problem: Pain Managment: Goal: General experience of comfort will improve and/or be controlled Outcome: Progressing

## 2024-02-06 NOTE — TOC Progression Note (Signed)
 Transition of Care [facility]) - Progression Note    Patient Details  Name: Joan Wood MRN: [id number] Date of Birth: December 16, 1970  Transition [facility] Care [facility]) CM/SW Contact  [name], [name] Phone Number: 02/06/2024, 11:48 AM  Clinical Narrative:   CSW met with patient per patient request. Patient asking about the hospital paying for a motel room for her for a month to prove to her family that she can care for herself, then she is hoping that one of them will agree to take her. CSW relayed that patient would need someone to be with her for supervision, and patient disagreed. Patient expressed frustration that the team believes she cannot take care of herself, and patient attempted to discuss how she would manage herself on her own. CSW reported that the medical team continues to recommend that the patient have constant supervision and not manage her own medications. Patient asked CSW to look into motel option anyway. CSW spoke with inpatient care management supervisor about a motel room for the patient, and we cannot pay for a motel room if it is not a safe disposition. CSW discussed with MD, being alone would not be a safe disposition for patient. CSW to follow.    Expected Discharge Plan: Skilled Nursing Facility Barriers to Discharge: Homeless with medical needs, Inadequate or no insurance, Continued Medical Work up, Unsafe home situation               Expected Discharge Plan and Services In-house Referral: Clinical Social Work Discharge Planning Services: CM Consult Post Acute Care Choice: Home Health Living arrangements for the past 2 months: Single Family Home                 DME Arranged: Walker rolling   Date DME Agency Contacted: 10/08/23   Representative spoke with at DME Agency: [name]             Social Drivers of Health (SDOH) Interventions SDOH Screenings   Food Insecurity: No Food Insecurity (10/03/2023)  Housing: Low Risk (10/03/2023)  Transportation  Needs: No Transportation Needs (10/03/2023)  Recent Concern: Transportation Needs - Unmet Transportation Needs (08/20/2023)  Utilities: Not At Risk (10/03/2023)  Depression (PHQ2-9): High Risk (07/30/2023)  Social Connections: Socially Isolated (10/03/2023)  Tobacco Use: High Risk (10/04/2023)    Readmission Risk Interventions    10/04/2023    9:02 AM 09/03/2023   12:31 PM 09/02/2023    9:01 AM  Readmission Risk Prevention Plan  Transportation Screening Complete Complete Complete  PCP or Specialist Appt within 3-5 Days   Complete  HRI or Home Care Consult   Complete  Social Work Consult for Recovery Care Planning/Counseling   Complete  Palliative Care Screening   Not Applicable  Medication Review Oceanographer) Complete Complete Complete  HRI or Home Care Consult Complete Complete   SW Recovery Care/Counseling Consult Complete Complete   Palliative Care Screening Not Applicable Not Applicable   Skilled Nursing Facility Not Applicable Patient Refused

## 2024-02-06 NOTE — Progress Notes (Signed)
 " Progress Note   Patient: Joan Wood FMW:[id number] DOB: 1970-06-24 DOA: 10/03/2023     126 DOS: the patient was seen and examined on 02/06/2024   Brief hospital course: Brief Narrative:  54 year old with history of decompensated liver cirrhosis, PUD, gastric ulcer perforation requiring exploratory laparotomy, GERD, pancreatitis, OSA admitted initially for coffee-ground emesis and bloody diarrhea.  Consulted GI underwent endoscopy which revealed nonbleeding ulcer and esophagitis and some concerns of esophageal necrosis.  This was treated by GI team and IR had also been consulted.  Hospital course also complicated by hepatic encephalopathy requiring lactulose  and rifaximin .  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  This is felt to be likely alcoholic cirrhosis with no clear other etiology. 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  Hemoglobin stable. - GI performed endoscopy - Continue PPI  #) Cold sores: Improved with acyclovir   #) AKI: Likely from contrast, resolved  #) Appendicitis: Much earlier in the admission.  Treated supportively with antibiotics.  Resolved.  #) Chronic pain: -Continue oxycodone  5 mg every 4 hours as needed - Continue gabapentin  600 mg 3 times daily - Continue as needed hydroxyzine  - Continue quetiapine  25 mg nightly  # )alcohol  abuse: - Continue thiamine , folate, multivitamin  #) Severe malnutrition: On oral supplementation  Fluids: None Electrolytes: Monitor and supplement Nutrition: Noted to have severe protein calorie malnutrition, supplements  Prophylaxis: Enoxaparin   Disposition: Needs skilled nursing facility, will continue to discuss with family but they are unable to provide 24 hours care and patient has no payer source  Full code, met with palliative and wants to pursue all options     Assessment and Plan: No notes have been filed under this hospital service. Service: Hospitalist       Subjective: Multiple questions about possible discharge facilities  Physical Exam: Vitals:   02/05/24 2023 02/05/24 2345 02/06/24 0515 02/06/24 0800  BP: 126/70 118/73 112/70 111/68  Pulse: 91 95 (!) 101 96  Resp: 16 18 18 18   Temp: 99 F (37.2 C) 98.7 F (37.1 C) 98.4 F (36.9 C) 97.6 F (36.4 C)  TempSrc: Oral Oral Oral Oral  SpO2: 99% 99% 100% 100%  Weight:   60.4 kg   Height:       General exam: Appears calm and comfortable  Respiratory system: Clear to auscultation. Respiratory effort normal. Cardiovascular system: S1 & S2 heard, RRR. No JVD,  murmurs, rubs, gallops or clicks. No pedal edema. Gastrointestinal system: Soft, less to tenderness distended, normal bowel sounds Central nervous system: Alert and oriented. No focal neurological  deficits. Extremities: Symmetric 5 x 5 power. Skin: No rashes, lesions or ulcers Psychiatry: Judgement and insight appear poor. Mood & affect appropriate. Data Reviewed:  There are no new results to review at this time.  Family Communication: Called family  Disposition: Status is: Inpatient Remains inpatient appropriate because: Placement  Planned Discharge Destination: Skilled nursing facility      Author: Maybelle JAYSON Pelton, MD 02/06/2024 10:24 AM  For on call review [email or web address]  "

## 2024-02-07 DIAGNOSIS — K7290 Hepatic failure, unspecified without coma: Secondary | ICD-10-CM | POA: Diagnosis not present

## 2024-02-07 DIAGNOSIS — K7460 Unspecified cirrhosis of liver: Secondary | ICD-10-CM | POA: Diagnosis not present

## 2024-02-07 MED ORDER — MUPIROCIN 2 % EX OINT
1.0000 | TOPICAL_OINTMENT | Freq: Two times a day (BID) | CUTANEOUS | Status: AC
  Administered 2024-02-07 – 2024-02-12 (×10): 1 via TOPICAL
  Filled 2024-02-07 (×4): qty 22

## 2024-02-07 MED ORDER — ENOXAPARIN SODIUM 30 MG/0.3ML IJ SOSY
30.0000 mg | PREFILLED_SYRINGE | INTRAMUSCULAR | Status: AC
  Administered 2024-02-07 – 2024-02-15 (×9): 30 mg via SUBCUTANEOUS
  Filled 2024-02-07 (×9): qty 0.3

## 2024-02-07 MED ORDER — MUPIROCIN 2 % EX OINT: TOPICAL_OINTMENT | Freq: Two times a day (BID) | CUTANEOUS | Status: DC

## 2024-02-07 NOTE — Plan of Care (Signed)
  Problem: Clinical Measurements: Goal: Diagnostic test results will improve Outcome: Progressing Goal: Respiratory complications will improve Outcome: Progressing Goal: Cardiovascular complication will be avoided Outcome: Progressing   Problem: Activity: Goal: Risk for activity intolerance will decrease Outcome: Progressing   Problem: Nutrition: Goal: Adequate nutrition will be maintained Outcome: Progressing   Problem: Coping: Goal: Level of anxiety will decrease Outcome: Progressing   Problem: Elimination: Goal: Will not experience complications related to bowel motility Outcome: Progressing Goal: Will not experience complications related to urinary retention Outcome: Progressing   Problem: Pain Managment: Goal: General experience of comfort will improve and/or be controlled Outcome: Progressing   Problem: Safety: Goal: Ability to remain free from injury will improve Outcome: Progressing   Problem: Skin Integrity: Goal: Risk for impaired skin integrity will decrease Outcome: Progressing

## 2024-02-07 NOTE — Progress Notes (Signed)
 " Progress Note   Patient: Joan Wood FMW:[id number] DOB: 1970/04/03 DOA: 10/03/2023     127 DOS: the patient was seen and examined on 02/07/2024   Brief hospital course: Brief Narrative:  54 year old with history of decompensated liver cirrhosis, PUD, gastric ulcer perforation requiring exploratory laparotomy, GERD, pancreatitis, OSA admitted initially for coffee-ground emesis and bloody diarrhea.  Consulted GI underwent endoscopy which revealed nonbleeding ulcer and esophagitis and some concerns of esophageal necrosis.  This was treated by GI team and IR had also been consulted.  Hospital course also complicated by hepatic encephalopathy requiring lactulose  and rifaximin .  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  This is felt to be likely alcoholic cirrhosis with no clear other etiology. 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  Hemoglobin stable. - Continue PPI  #) Cold sores: Improved with acyclovir   #) AKI: Likely from contrast, resolved  #) Appendicitis: Much earlier in the admission.  Treated supportively with antibiotics.  Resolved.  #) Chronic pain: -Continue oxycodone  5 mg every 4 hours as needed - Continue gabapentin  600 mg 3 times daily - Continue as needed hydroxyzine  - Continue quetiapine  25 mg nightly  # )alcohol  abuse: - Continue thiamine , folate, multivitamin  #) Severe malnutrition: On oral supplementation  Fluids: None Electrolytes: Monitor and supplement Nutrition: Noted to have severe protein calorie malnutrition, supplements  Prophylaxis: Enoxaparin   Disposition: Needs skilled nursing facility, will continue to discuss with family but they are unable to provide 24 hours care and patient has no payer source  Full code, met with palliative and wants to pursue all options     Assessment and Plan: No notes have been filed under this hospital service. Service: Hospitalist       Subjective: Sleeping in bed  Physical Exam: Vitals:   02/06/24 1202 02/06/24 1523 02/07/24 0014 02/07/24 0810  BP: 107/72 128/67 109/71 109/68  Pulse: 84 94 100 95  Resp: 16 14 17 18   Temp: 98 F (36.7 C) 98.6 F (37 C) 98.1 F (36.7 C) 98.3 F (36.8 C)  TempSrc: Oral Oral Oral Oral  SpO2: 99% 98% 99% 100%  Weight:      Height:       General exam: Appears calm and comfortable  Respiratory system: Clear to auscultation. Respiratory effort normal. Cardiovascular system: S1 & S2 heard, RRR. No JVD, murmurs, rubs, gallops or clicks. No pedal edema. Gastrointestinal system: Soft, less to tenderness distended, normal bowel sounds Central nervous system: Alert and oriented. No focal neurological  deficits. Extremities: Symmetric 5 x 5 power. Skin: No rashes, lesions or ulcers Psychiatry: Judgement and insight appear poor. Mood & affect appropriate. Data Reviewed:  There are no new  results to review at this time.  Family Communication: Called family  Disposition: Status is: Inpatient Remains inpatient appropriate because: Placement  Planned Discharge Destination: Skilled nursing facility      Author: Maybelle JAYSON Pelton, MD 02/07/2024 10:20 AM  For on call review [email or web address]  "

## 2024-02-07 NOTE — Plan of Care (Signed)
 Educated and instructed not to get out of bed without supervision due to high fall risk. Fall precautions reinforced. Patient verbalize understanding. Ambulated with RN around the hallway multiple times.   Problem: Safety: Goal: Ability to remain free from injury will improve 02/07/2024 1531 by Marissa Shall, RN Outcome: Progressing 02/07/2024 1350 by Marissa Shall, RN Outcome: Progressing   Problem: Skin Integrity: Goal: Risk for impaired skin integrity will decrease 02/07/2024 1531 by Marissa Shall, RN Outcome: Progressing 02/07/2024 1350 by Marissa Shall, RN Outcome: Progressing   Problem: Pain Managment: Goal: General experience of comfort will improve and/or be controlled 02/07/2024 1531 by Marissa Shall, RN Outcome: Progressing 02/07/2024 1350 by Marissa Shall, RN Outcome: Progressing   Problem: Nutrition: Goal: Adequate nutrition will be maintained 02/07/2024 1531 by Marissa Shall, RN Outcome: Progressing 02/07/2024 1350 by Marissa Shall, RN Outcome: Progressing

## 2024-02-08 DIAGNOSIS — K7290 Hepatic failure, unspecified without coma: Secondary | ICD-10-CM | POA: Diagnosis not present

## 2024-02-08 DIAGNOSIS — K7460 Unspecified cirrhosis of liver: Secondary | ICD-10-CM | POA: Diagnosis not present

## 2024-02-08 LAB — CBC WITH DIFFERENTIAL/PLATELET
Abs Immature Granulocytes: 0.01 10*3/uL (ref 0.00–0.07)
Basophils Absolute: 0 10*3/uL (ref 0.0–0.1)
Basophils Relative: 0 %
Eosinophils Absolute: 0.1 10*3/uL (ref 0.0–0.5)
Eosinophils Relative: 3 %
HCT: 26.6 % — ABNORMAL LOW (ref 36.0–46.0)
Hemoglobin: 8.5 g/dL — ABNORMAL LOW (ref 12.0–15.0)
Immature Granulocytes: 0 %
Lymphocytes Relative: 25 %
Lymphs Abs: 0.9 10*3/uL (ref 0.7–4.0)
MCH: 27 pg (ref 26.0–34.0)
MCHC: 32 g/dL (ref 30.0–36.0)
MCV: 84.4 fL (ref 80.0–100.0)
Monocytes Absolute: 0.6 10*3/uL (ref 0.1–1.0)
Monocytes Relative: 17 %
Neutro Abs: 1.9 10*3/uL (ref 1.7–7.7)
Neutrophils Relative %: 55 %
Platelets: 168 10*3/uL (ref 150–400)
RBC: 3.15 MIL/uL — ABNORMAL LOW (ref 3.87–5.11)
RDW: 15 % (ref 11.5–15.5)
Smear Review: NORMAL
WBC: 3.5 10*3/uL — ABNORMAL LOW (ref 4.0–10.5)
nRBC: 0 % (ref 0.0–0.2)

## 2024-02-08 LAB — COMPREHENSIVE METABOLIC PANEL WITH GFR
ALT: 21 U/L (ref 0–44)
AST: 47 U/L — ABNORMAL HIGH (ref 15–41)
Albumin: 2.9 g/dL — ABNORMAL LOW (ref 3.5–5.0)
Alkaline Phosphatase: 105 U/L (ref 38–126)
Anion gap: 10 (ref 5–15)
BUN: 17 mg/dL (ref 6–20)
CO2: 23 mmol/L (ref 22–32)
Calcium: 8.8 mg/dL — ABNORMAL LOW (ref 8.9–10.3)
Chloride: 98 mmol/L (ref 98–111)
Creatinine, Ser: 0.59 mg/dL (ref 0.44–1.00)
GFR, Estimated: >60 mL/min
Glucose, Bld: 96 mg/dL (ref 70–99)
Potassium: 4.2 mmol/L (ref 3.5–5.1)
Sodium: 131 mmol/L — ABNORMAL LOW (ref 135–145)
Total Bilirubin: 0.6 mg/dL (ref 0.0–1.2)
Total Protein: 7.5 g/dL (ref 6.5–8.1)

## 2024-02-08 LAB — MAGNESIUM: Magnesium: 1.7 mg/dL (ref 1.7–2.4)

## 2024-02-08 LAB — PHOSPHORUS: Phosphorus: 4.5 mg/dL (ref 2.5–4.6)

## 2024-02-08 MED ORDER — ALBUMIN HUMAN 25 % IV SOLN: 50.0000 g | Freq: Once | INTRAVENOUS | Status: AC

## 2024-02-08 NOTE — Progress Notes (Signed)
 " Progress Note   Patient: Joan Wood FMW:[id number] DOB: 30-May-1970 DOA: 10/03/2023     128 DOS: the patient was seen and examined on 02/08/2024   Brief Narrative:  Patient is a 54 year old with past medical history significant for decompensated liver cirrhosis with recurrent ascites, PUD, gastric ulcer perforation requiring exploratory laparotomy, GERD, pancreatitis and OSA.  Patient was admitted with coffee-ground emesis and bloody diarrhea.  GI was consulted.  Patient underwent endoscopy which revealed nonbleeding ulcer and esophagitis and some concerns of esophageal necrosis.  This was treated by GI team and IR had also been consulted.  Hospital course also complicated by hepatic encephalopathy requiring lactulose  and rifaximin .  Intermittently that she requires paracentesis.  Patient is awaiting disposition.  02/08/2024: Patient seen.  He has been reaccumulation of the ascitic fluid.  Will consult IR for repeat paracentesis.  Will administer albumin  with paracentesis.  Otherwise, no new changes.   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  This is felt to be likely alcoholic cirrhosis with no clear other etiology. - Intermittently every 1 to 2 weeks was requiring paracentesis from IR, however last attempt on 02/04/2024 without any ascites - Continue lactulose  30 g 3 times daily - Rifaximin  550 mg twice daily - Continue furosemide  40 mg daily, spironolactone  100 mg daily 02/08/2024: Recurrent ascites.  Shifting dullness noted today.  IR consulted for repeat paracentesis.  IV albumin  with paracentesis.  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  Hemoglobin stable. - Continue PPI 02/08/2024: Hyponatremia may have prognostic significance.  #) Cold sores: Improved with acyclovir   #) AKI: - Likely from contrast, resolved 02/08/2024: AKI has resolved.  #) Appendicitis: Much earlier in the admission.  Treated supportively with antibiotics.  Resolved.  #) Chronic pain: -Continue oxycodone  5 mg every 4 hours as needed - Continue gabapentin  600 mg 3 times daily - Continue as needed hydroxyzine  - Continue quetiapine  25 mg nightly  # )alcohol  abuse: - Continue thiamine , folate, multivitamin  #) Severe malnutrition: On oral supplementation  Fluids: None Electrolytes: Monitor and supplement Nutrition: Noted to have severe protein calorie malnutrition, supplements  Prophylaxis: Enoxaparin   Disposition: Needs skilled nursing facility, will continue to discuss with family but they are unable to provide 24 hours care and patient has no payer source  Full code, met with palliative and wants to pursue all options     Assessment and Plan: No notes have been filed under this hospital service. Service: Hospitalist       Subjective: Sleeping in bed  Physical Exam: Vitals:   02/08/24 0500 02/08/24 0501 02/08/24 0914 02/08/24 1523  BP:  107/64 118/73 (!) 108/55  Pulse:  91 (!) 102 86  Resp:   18 15  Temp:  99.1 F (37.3 C) 98 F (36.7 C) 98.3 F (36.8 C)  TempSrc:  Oral Oral Oral  SpO2:  99% 98% 100%  Weight: 56.4 kg     Height:       General exam: Not in any distress.  Awake and alert. Respiratory system: Clear to auscultation. Respiratory effort  normal. Cardiovascular system: S1 & S2 heard. Gastrointestinal system: Distended, with shifting dullness.  No tenderness. Central nervous system: Awake and alert.  Data Reviewed:  There are no new results to review at this time.  Family Communication:  Disposition: Status is: Inpatient Remains inpatient appropriate because: Placement  Planned Discharge Destination: Skilled nursing facility    Time spent 35 minutes.  Author: Leatrice LILLETTE Chapel, MD 02/08/2024 5:45 PM  For on call review [email or web address].  "

## 2024-02-08 NOTE — Progress Notes (Signed)
 Pt repeatedly turns off bed alarm. On one instance she appeared at the rn station after turning her bed alarm off asking for ice. Pt refuses to cooperate with staff and safety policy and tries to manipulate staff by using call bell incessantly when she doesn't get exactly what she wants.

## 2024-02-08 NOTE — Plan of Care (Signed)
" °  Problem: Clinical Measurements: Goal: Diagnostic test results will improve Outcome: Not Progressing Goal: Respiratory complications will improve Outcome: Not Progressing Goal: Cardiovascular complication will be avoided Outcome: Not Progressing   Problem: Activity: Goal: Risk for activity intolerance will decrease Outcome: Not Progressing   Problem: Nutrition: Goal: Adequate nutrition will be maintained Outcome: Not Progressing   Problem: Coping: Goal: Level of anxiety will decrease Outcome: Not Progressing   Problem: Elimination: Goal: Will not experience complications related to bowel motility Outcome: Not Progressing Goal: Will not experience complications related to urinary retention Outcome: Not Progressing   Problem: Pain Managment: Goal: General experience of comfort will improve and/or be controlled Outcome: Not Progressing   Problem: Safety: Goal: Ability to remain free from injury will improve Outcome: Not Progressing   Problem: Skin Integrity: Goal: Risk for impaired skin integrity will decrease Outcome: Not Progressing   "

## 2024-02-09 DIAGNOSIS — K7460 Unspecified cirrhosis of liver: Secondary | ICD-10-CM | POA: Diagnosis not present

## 2024-02-09 DIAGNOSIS — K7290 Hepatic failure, unspecified without coma: Secondary | ICD-10-CM | POA: Diagnosis not present

## 2024-02-09 NOTE — Plan of Care (Signed)
 Complained of pain and anxiety so PRN medicines given.    Problem: Activity: Goal: Risk for activity intolerance will decrease Outcome: Progressing   Problem: Nutrition: Goal: Adequate nutrition will be maintained Outcome: Progressing   Problem: Coping: Goal: Level of anxiety will decrease Outcome: Progressing   Problem: Elimination: Goal: Will not experience complications related to bowel motility Outcome: Progressing   Problem: Elimination: Goal: Will not experience complications related to urinary retention Outcome: Progressing   Problem: Pain Managment: Goal: General experience of comfort will improve and/or be controlled Outcome: Progressing   Problem: Safety: Goal: Ability to remain free from injury will improve Outcome: Progressing   Problem: Skin Integrity: Goal: Risk for impaired skin integrity will decrease Outcome: Progressing   Problem: Safety: Goal: Ability to remain free from injury will improve Outcome: Progressing   Problem: Pain Managment: Goal: General experience of comfort will improve and/or be controlled Outcome: Progressing

## 2024-02-09 NOTE — Plan of Care (Signed)
  Problem: Clinical Measurements: Goal: Diagnostic test results will improve Outcome: Progressing Goal: Respiratory complications will improve Outcome: Progressing Goal: Cardiovascular complication will be avoided Outcome: Progressing   Problem: Activity: Goal: Risk for activity intolerance will decrease Outcome: Progressing   Problem: Nutrition: Goal: Adequate nutrition will be maintained Outcome: Progressing   Problem: Coping: Goal: Level of anxiety will decrease Outcome: Progressing   Problem: Elimination: Goal: Will not experience complications related to bowel motility Outcome: Progressing Goal: Will not experience complications related to urinary retention Outcome: Progressing   Problem: Pain Managment: Goal: General experience of comfort will improve and/or be controlled Outcome: Progressing   Problem: Safety: Goal: Ability to remain free from injury will improve Outcome: Progressing   Problem: Skin Integrity: Goal: Risk for impaired skin integrity will decrease Outcome: Progressing

## 2024-02-09 NOTE — Progress Notes (Signed)
 Has been very impulsive today.  Kept getting up out of the bed and walking out of the room causing the alarm to go off.  This nurse and others offered to walk her today but she said she is not a baby and will not walk if she has to be escorted like a child. She also turns off bed alarm.  She was witnessed almost falling and she said it was only because her sock was coming off.  Was reminded that even if her sock was coming off, she still almost fell and that she really is a fall risk.  She said staff was making up lies about her.

## 2024-02-09 NOTE — Progress Notes (Signed)
 " Progress Note   Patient: Joan Wood:[id number] DOB: 1970-01-19 DOA: 10/03/2023     129 DOS: the patient was seen and examined on 02/09/2024   Brief Narrative:  Patient is a 54 year old with past medical history significant for decompensated liver cirrhosis with recurrent ascites, PUD, gastric ulcer perforation requiring exploratory laparotomy, GERD, pancreatitis and OSA.  Patient was admitted with coffee-ground emesis and bloody diarrhea.  GI was consulted.  Patient underwent endoscopy which revealed nonbleeding ulcer and esophagitis and some concerns of esophageal necrosis.  This was treated by GI team and IR had also been consulted.  Hospital course also complicated by hepatic encephalopathy requiring lactulose  and rifaximin .  Intermittently that she requires paracentesis.  Patient is awaiting disposition.  02/08/2024: Patient seen.  He has been reaccumulation of the ascitic fluid.  Will consult IR for repeat paracentesis.  Will administer albumin  with paracentesis.  Otherwise, no new changes. 02/09/2024: Patient seen alongside charge nurse.  Awaiting paracentesis.  No new complaints.   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  This is felt to be likely alcoholic cirrhosis with no clear other etiology. - Intermittently every 1 to 2 weeks was requiring paracentesis from IR, however last attempt on 02/04/2024 without any ascites - Continue lactulose  30 g 3 times daily - Rifaximin  550 mg twice daily - Continue furosemide  40 mg daily, spironolactone  100 mg daily 02/08/2024: Recurrent ascites.  Shifting dullness noted today.  IR consulted for repeat paracentesis.  IV albumin  with paracentesis. 02/09/2024: Pursue paracentesis.  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  Hemoglobin stable. - Continue PPI 02/08/2024: Hyponatremia may have prognostic significance.  #) Cold sores: Improved with acyclovir   #) AKI: - Likely from contrast, resolved 02/08/2024: AKI has resolved.  #) Appendicitis: Much earlier in the admission.  Treated supportively with antibiotics.  Resolved.  #) Chronic pain: -Continue oxycodone  5 mg every 4 hours as needed - Continue gabapentin  600 mg 3 times daily - Continue as needed hydroxyzine  - Continue quetiapine  25 mg nightly  # )alcohol  abuse: - Continue thiamine , folate, multivitamin  #) Severe malnutrition: On oral supplementation  Fluids: None Electrolytes: Monitor and supplement Nutrition: Noted to have severe protein calorie malnutrition, supplements  Prophylaxis: Enoxaparin   Disposition: Needs skilled nursing facility, will continue to discuss with family but they are unable to provide 24 hours care and patient has no payer source  Full code, met with palliative and wants to pursue all options     Assessment and Plan: No notes have been filed under this hospital service. Service: Hospitalist       Subjective: Sleeping in bed  Physical Exam: Vitals:   02/09/24 0316 02/09/24 0500 02/09/24 1438 02/09/24 1800  BP: 110/63  127/71 118/68  Pulse: 99  90 88  Resp: 17  16 16   Temp: 98.3 F (36.8 C)  98.9  F (37.2 C) 98.8 F (37.1 C)  TempSrc: Oral  Oral Oral  SpO2: 98%  97%   Weight:  56.6 kg    Height:        General exam: Not in any distress.  Awake and alert. Respiratory system: Clear to auscultation. Respiratory effort normal. Cardiovascular system: S1 & S2 heard. Gastrointestinal system: Distended, with shifting dullness.  No tenderness. Central nervous system: Awake and alert.  Data Reviewed:  There are no new results to review at this time.  Family Communication:  Disposition: Status is: Inpatient Remains inpatient appropriate because: Placement  Planned Discharge Destination: Skilled nursing facility    Time spent 35 minutes.  Author: Leatrice LILLETTE Chapel, MD 02/09/2024 7:06 PM  For on call review [email or web address].  "

## 2024-02-10 DIAGNOSIS — K7290 Hepatic failure, unspecified without coma: Secondary | ICD-10-CM | POA: Diagnosis not present

## 2024-02-10 DIAGNOSIS — K7460 Unspecified cirrhosis of liver: Secondary | ICD-10-CM | POA: Diagnosis not present

## 2024-02-10 NOTE — Plan of Care (Signed)
 Helped her ambulate with assistance. She tries to get her bed alarm off, shut the door frequently and be out of the bed most of the time.   Problem: Activity: Goal: Risk for activity intolerance will decrease Outcome: Progressing   Problem: Nutrition: Goal: Adequate nutrition will be maintained Outcome: Progressing   Problem: Safety: Goal: Ability to remain free from injury will improve Outcome: Progressing   Problem: Skin Integrity: Goal: Risk for impaired skin integrity will decrease Outcome: Progressing   Problem: Skin Integrity: Goal: Risk for impaired skin integrity will decrease Outcome: Progressing   Problem: Clinical Measurements: Goal: Respiratory complications will improve Outcome: Progressing   Problem: Coping: Goal: Level of anxiety will decrease Outcome: Progressing   Problem: Elimination: Goal: Will not experience complications related to bowel motility Outcome: Progressing   Problem: Elimination: Goal: Will not experience complications related to urinary retention Outcome: Progressing   Problem: Pain Managment: Goal: General experience of comfort will improve and/or be controlled Outcome: Progressing

## 2024-02-10 NOTE — Plan of Care (Signed)
  Problem: Clinical Measurements: Goal: Diagnostic test results will improve Outcome: Progressing Goal: Respiratory complications will improve Outcome: Progressing Goal: Cardiovascular complication will be avoided Outcome: Progressing   Problem: Activity: Goal: Risk for activity intolerance will decrease Outcome: Progressing   Problem: Nutrition: Goal: Adequate nutrition will be maintained Outcome: Progressing   Problem: Coping: Goal: Level of anxiety will decrease Outcome: Progressing   Problem: Elimination: Goal: Will not experience complications related to bowel motility Outcome: Progressing Goal: Will not experience complications related to urinary retention Outcome: Progressing   Problem: Pain Managment: Goal: General experience of comfort will improve and/or be controlled Outcome: Progressing   Problem: Safety: Goal: Ability to remain free from injury will improve Outcome: Progressing   Problem: Skin Integrity: Goal: Risk for impaired skin integrity will decrease Outcome: Progressing

## 2024-02-10 NOTE — Progress Notes (Signed)
 " Progress Note   Patient: Joan Wood FMW:[id number] DOB: 1970/01/29 DOA: 10/03/2023     130 DOS: the patient was seen and examined on 02/10/2024   Brief Narrative:  Patient is a 54 year old with past medical history significant for decompensated liver cirrhosis with recurrent ascites, PUD, gastric ulcer perforation requiring exploratory laparotomy, GERD, pancreatitis and OSA.  Patient was admitted with coffee-ground emesis and bloody diarrhea.  GI was consulted.  Patient underwent endoscopy which revealed nonbleeding ulcer and esophagitis and some concerns of esophageal necrosis.  This was treated by GI team and IR had also been consulted.  Hospital course also complicated by hepatic encephalopathy requiring lactulose  and rifaximin .  Intermittently that she requires paracentesis.  Patient is awaiting disposition.  02/08/2024: Patient seen.  He has been reaccumulation of the ascitic fluid.  Will consult IR for repeat paracentesis.  Will administer albumin  with paracentesis.  Otherwise, no new changes. 02/10/2024: Awaiting paracentesis.  No new complaints.     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  This is felt to be likely alcoholic cirrhosis with no clear other etiology. - Intermittently every 1 to 2 weeks was requiring paracentesis from IR, however last attempt on 02/04/2024 without any ascites - Continue lactulose  30 g 3 times daily - Rifaximin  550 mg twice daily - Continue furosemide  40 mg daily, spironolactone  100 mg daily 02/08/2024: Recurrent ascites.  Shifting dullness noted today.  IR consulted for repeat paracentesis.  IV albumin  with paracentesis. 02/10/2024:  Pursue paracentesis.  #) Deconditioning: Walking with staff as tolerated, patient per administration cannot walk by herself due to hospital policy - As needed oxycodone  every 4 hours after walking  #) Chronic  hypotension: Presumed secondary from cirrhosis and HRS physiology - Continue midodrine  10 mg 3 times daily  #) Electrolyte derangements: Secondary to diuretics and cirrhosis - On oral magnesium  oxide 400 mg twice daily - Continue potassium chloride  20 mill equivalents daily  #) Chronic hyponatremia: Moderate, serum sodium hovers around mid 120s - Fluid restrict to 1.5 L and continue midodrine  acute GI bleed: This occurred from September to November requiring PRBCs.  Hemoglobin stable. - Continue PPI 02/08/2024: Hyponatremia may have prognostic significance.  #) Cold sores: Improved with acyclovir   #) AKI: - Likely from contrast, resolved 02/08/2024: AKI has resolved.  #) Appendicitis: Much earlier in the admission.  Treated supportively with antibiotics.  Resolved.  #) Chronic pain: -Continue oxycodone  5 mg every 4 hours as needed - Continue gabapentin  600 mg 3 times daily - Continue as needed hydroxyzine  - Continue quetiapine  25 mg nightly  # )alcohol  abuse: - Continue thiamine , folate, multivitamin  #) Severe malnutrition: On oral supplementation  Fluids: None Electrolytes: Monitor and supplement Nutrition: Noted to have severe protein calorie malnutrition, supplements  Prophylaxis: Enoxaparin   Disposition: Needs skilled nursing facility, will continue to discuss with family but they are unable to provide 24 hours care and patient has no payer source  Full code, met with palliative and wants to pursue all options     Assessment and Plan: No notes have been filed under this hospital service. Service: Hospitalist       Subjective: No new complaints.  Physical Exam: Vitals:   02/09/24 1800 02/10/24 0139 02/10/24 0850 02/10/24 1208  BP: 118/68 115/73 110/70 113/74  Pulse: 88 98 90 95  Resp: 16 18 18 16   Temp: 98.8 F (37.1 C) 98 F (36.7 C) 98.5  F (36.9 C) 98.3 F (36.8 C)  TempSrc: Oral Axillary Oral Oral  SpO2:  100% 99% 99%  Weight:      Height:        General exam: Not in any distress.  Awake and alert. Respiratory system: Clear to auscultation. Respiratory effort normal. Cardiovascular system: S1 & S2 heard. Gastrointestinal system: Distended, with shifting dullness.  No tenderness. Central nervous system: Awake and alert.  Data Reviewed:  There are no new results to review at this time.  Family Communication:  Disposition: Status is: Inpatient Remains inpatient appropriate because: Placement  Planned Discharge Destination: Skilled nursing facility    Time spent 35 minutes.  Author: Leatrice LILLETTE Chapel, MD 02/10/2024 5:09 PM  For on call review [email or web address].  "

## 2024-02-11 ENCOUNTER — Inpatient Hospital Stay (HOSPITAL_COMMUNITY)

## 2024-02-11 DIAGNOSIS — K7290 Hepatic failure, unspecified without coma: Secondary | ICD-10-CM | POA: Diagnosis not present

## 2024-02-11 DIAGNOSIS — K7460 Unspecified cirrhosis of liver: Secondary | ICD-10-CM | POA: Diagnosis not present

## 2024-02-11 MED ORDER — LIDOCAINE-EPINEPHRINE 1 %-1:100000 IJ SOLN
INTRAMUSCULAR | Status: AC
  Filled 2024-02-11: qty 20

## 2024-02-11 NOTE — Plan of Care (Signed)
  Problem: Clinical Measurements: Goal: Diagnostic test results will improve Outcome: Progressing Goal: Respiratory complications will improve Outcome: Progressing Goal: Cardiovascular complication will be avoided Outcome: Progressing   Problem: Activity: Goal: Risk for activity intolerance will decrease Outcome: Progressing   Problem: Nutrition: Goal: Adequate nutrition will be maintained Outcome: Progressing   Problem: Coping: Goal: Level of anxiety will decrease Outcome: Progressing   Problem: Elimination: Goal: Will not experience complications related to bowel motility Outcome: Progressing Goal: Will not experience complications related to urinary retention Outcome: Progressing   Problem: Pain Managment: Goal: General experience of comfort will improve and/or be controlled Outcome: Progressing   Problem: Safety: Goal: Ability to remain free from injury will improve Outcome: Progressing   Problem: Skin Integrity: Goal: Risk for impaired skin integrity will decrease Outcome: Progressing

## 2024-02-11 NOTE — Progress Notes (Signed)
 I was present during limited ultrasound of the abdomen prior to possible paracentesis.   No ascites on ultrasound. There was no significant pocket of fluid to allow for safe approach for paracentesis. No procedure performed.   Jamieson Lisa B Sonu Kruckenberg NP 02/11/2024 11:25 AM

## 2024-02-11 NOTE — Plan of Care (Signed)
  Problem: Activity: Goal: Risk for activity intolerance will decrease Outcome: Progressing   Problem: Nutrition: Goal: Adequate nutrition will be maintained Outcome: Progressing   Problem: Coping: Goal: Level of anxiety will decrease Outcome: Progressing   Problem: Pain Managment: Goal: General experience of comfort will improve and/or be controlled Outcome: Progressing   Problem: Safety: Goal: Ability to remain free from injury will improve Outcome: Progressing

## 2024-02-11 NOTE — Progress Notes (Signed)
 " Progress Note   Patient: Joan Wood FMW:[id number] DOB: 1970/11/11 DOA: 10/03/2023     131 DOS: the patient was seen and examined on 02/11/2024   Brief Narrative:  Patient is a 54 year old with past medical history significant for decompensated liver cirrhosis with recurrent ascites, PUD, gastric ulcer perforation requiring exploratory laparotomy, GERD, pancreatitis and OSA.  Patient was admitted with coffee-ground emesis and bloody diarrhea.  GI was consulted.  Patient underwent endoscopy which revealed nonbleeding ulcer and esophagitis and some concerns of esophageal necrosis.  This was treated by GI team and IR had also been consulted.  Hospital course also complicated by hepatic encephalopathy requiring lactulose  and rifaximin .  Intermittently that she requires paracentesis.  Patient is awaiting disposition.   02/11/2024: Awaiting paracentesis however, abdominal ultrasound did not reveal drainable ascites.  No new complaints.     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  This is felt to be likely alcoholic cirrhosis with no clear other etiology. - Intermittently every 1 to 2 weeks was requiring paracentesis from IR, however last attempt on 02/04/2024 without any ascites - Continue lactulose  30 g 3 times daily - Rifaximin  550 mg twice daily - Continue furosemide  40 mg daily, spironolactone  100 mg daily 02/08/2024: Recurrent ascites.  Shifting dullness noted today.  IR consulted for repeat paracentesis.  IV albumin  with paracentesis. 02/10/2024:  Pursue paracentesis.  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  Hemoglobin stable. - Continue PPI 02/08/2024: Hyponatremia may have prognostic significance.  #) Cold sores: Improved with acyclovir   #) AKI: - Likely from contrast, resolved 02/08/2024: AKI has resolved.  #) Appendicitis: Much earlier in the admission.  Treated supportively with antibiotics.  Resolved.  #) Chronic pain: -Continue oxycodone  5 mg every 4 hours as needed - Continue gabapentin  600 mg 3 times daily - Continue as needed hydroxyzine  - Continue quetiapine  25 mg nightly  # )alcohol  abuse: - Continue thiamine , folate, multivitamin  #) Severe malnutrition: On oral supplementation  Fluids: None Electrolytes: Monitor and supplement Nutrition: Noted to have severe protein calorie malnutrition, supplements  Prophylaxis: Enoxaparin   Disposition: Needs skilled nursing facility, will continue to discuss with family but they are unable to provide 24 hours care and patient has no payer source  Full code, met with palliative and wants to pursue all options     Assessment and Plan: No notes have been filed under this hospital service. Service: Hospitalist       Subjective: No new complaints.  Physical Exam: Vitals:   02/11/24 0021 02/11/24 0447 02/11/24 0922 02/11/24 1535  BP: 126/73 124/75 111/62 118/63  Pulse: 89 93 85 83  Resp: 18 19 18 19   Temp: 98.7 F (37.1 C) 98.2 F (36.8 C) 97.7 F (36.5 C) 98.2 F (36.8 C)  TempSrc: Oral Oral Oral Oral  SpO2: 98% 97% 100% 98%  Weight:  Height:       General exam: Not in any distress.  Awake and alert. Respiratory system: Clear to auscultation. Respiratory effort  normal. Cardiovascular system: S1 & S2 heard. Gastrointestinal system: Distended, with shifting dullness.  No tenderness. Central nervous system: Awake and alert.  Data Reviewed:  There are no new results to review at this time.  Family Communication:  Disposition: Status is: Inpatient Remains inpatient appropriate because: Placement  Planned Discharge Destination: Skilled nursing facility    Time spent 35 minutes.  Author: Leatrice LILLETTE Chapel, MD 02/11/2024 6:24 PM  For on call review [email or web address].  "

## 2024-02-12 NOTE — Plan of Care (Signed)
 Walked in the hallway with assistance by using front wheel walker. Diversional activities  is encouraged to distract the mind from pain.  Problem: Clinical Measurements: Goal: Diagnostic test results will improve Outcome: Progressing   Problem: Coping: Goal: Level of anxiety will decrease Outcome: Progressing   Problem: Nutrition: Goal: Adequate nutrition will be maintained Outcome: Progressing   Problem: Activity: Goal: Risk for activity intolerance will decrease Outcome: Progressing   Problem: Elimination: Goal: Will not experience complications related to bowel motility Outcome: Progressing   Problem: Safety: Goal: Ability to remain free from injury will improve Outcome: Progressing   Problem: Pain Managment: Goal: General experience of comfort will improve and/or be controlled Outcome: Progressing   Problem: Skin Integrity: Goal: Risk for impaired skin integrity will decrease Outcome: Progressing   Problem: Safety: Goal: Ability to remain free from injury will improve Outcome: Progressing   Problem: Skin Integrity: Goal: Risk for impaired skin integrity will decrease Outcome: Progressing

## 2024-02-13 MED ORDER — ENSURE PLUS HIGH PROTEIN PO LIQD: 237.0000 mL | Freq: Two times a day (BID) | ORAL | Status: AC | PRN

## 2024-02-13 MED ORDER — PROMETHAZINE HCL 12.5 MG PO TABS
12.5000 mg | ORAL_TABLET | Freq: Four times a day (QID) | ORAL | Status: AC | PRN
  Administered 2024-02-13 – 2024-02-15 (×4): 12.5 mg via ORAL
  Filled 2024-02-13 (×4): qty 1

## 2024-02-13 NOTE — TOC Progression Note (Signed)
 Transition of Care [facility]) - Progression Note    Patient Details  Name: Joan Wood MRN: [id number] Date of Birth: 1970/04/21  Transition of Care [facility]) CM/SW Contact  [name], [name] Phone Number: 02/13/2024, 11:41 AM  Clinical Narrative:   CSW met with patient per request, patient asking again about going to stay with her dad. CSW asked if anything had changed since last week, as CSW had spoken with patient's dad and he said he could not manage her at home. Patient said she wasn't aware that had occurred, and that her sister had told her that their dad wouldn't take her in. Patient asked about finding an apartment through the housing authority, that she had done that before, but CSW reminded patient that she would need someone to help care for her at home. Patient disagrees, does not feel that she needs anyone to help her, and that if she could find her own place for a couple of weeks and prove that she could take care of herself, then someone in her family would agree to take her in. CSW discussed with patient waiting on disability before doing anything else, and patient in agreement.  CSW received a call from [place] with Disability Services [phone]), asking for updated records for patient. CSW to send records for disability review. Marko would also like to speak with patient for a phone assessment, CSW to provide information to patient.     Expected Discharge Plan: Skilled Nursing Facility Barriers to Discharge: Homeless with medical needs, Inadequate or no insurance, Continued Medical Work up, Unsafe home situation               Expected Discharge Plan and Services In-house Referral: Clinical Social Work Discharge Planning Services: CM Consult Post Acute Care Choice: Home Health Living arrangements for the past 2 months: Single Family Home                 DME Arranged: Walker rolling   Date DME Agency Contacted: 10/08/23   Representative spoke with at  DME Agency: [name]             Social Drivers of Health (SDOH) Interventions SDOH Screenings   Food Insecurity: No Food Insecurity (10/03/2023)  Housing: Low Risk (10/03/2023)  Transportation Needs: No Transportation Needs (10/03/2023)  Recent Concern: Transportation Needs - Unmet Transportation Needs (08/20/2023)  Utilities: Not At Risk (10/03/2023)  Depression (PHQ2-9): High Risk (07/30/2023)  Social Connections: Socially Isolated (10/03/2023)  Tobacco Use: High Risk (10/04/2023)    Readmission Risk Interventions    10/04/2023    9:02 AM 09/03/2023   12:31 PM 09/02/2023    9:01 AM  Readmission Risk Prevention Plan  Transportation Screening Complete Complete Complete  PCP or Specialist Appt within 3-5 Days   Complete  HRI or Home Care Consult   Complete  Social Work Consult for Recovery Care Planning/Counseling   Complete  Palliative Care Screening   Not Applicable  Medication Review Oceanographer) Complete Complete Complete  HRI or Home Care Consult Complete Complete   SW Recovery Care/Counseling Consult Complete Complete   Palliative Care Screening Not Applicable Not Applicable   Skilled Nursing Facility Not Applicable Patient Refused

## 2024-02-13 NOTE — Plan of Care (Signed)
°  Problem: Clinical Measurements: Goal: Respiratory complications will improve Outcome: Progressing Goal: Cardiovascular complication will be avoided Outcome: Progressing   Problem: Activity: Goal: Risk for activity intolerance will decrease Outcome: Progressing   Problem: Elimination: Goal: Will not experience complications related to bowel motility Outcome: Progressing Goal: Will not experience complications related to urinary retention Outcome: Progressing   Problem: Pain Managment: Goal: General experience of comfort will improve and/or be controlled Outcome: Progressing   Problem: Safety: Goal: Ability to remain free from injury will improve Outcome: Progressing   Problem: Skin Integrity: Goal: Risk for impaired skin integrity will decrease Outcome: Progressing

## 2024-02-13 NOTE — Progress Notes (Signed)
 " PROGRESS NOTE Joan Wood  FMW:[id number] DOB: 02-26-1970 DOA: 10/03/2023 PCP: Edman Meade PEDLAR, FNP  Brief Narrative/Hospital Course: Joan Wood is a 54 y.o. female with PMH of history of decompensated liver cirrhosis, PUD, gastric ulcer perforation requiring exploratory laparotomy, GERD, pancreatitis, OSA admitted initially for coffee-ground emesis and bloody diarrhea.  Consulted GI underwent endoscopy which revealed nonbleeding ulcer and esophagitis and some concerns of esophageal necrosis.  This was treated by GI team and IR had also been consulted.  Hospital course also complicated by hepatic encephalopathy requiring lactulose  and rifaximin .  Intermittently that she requires paracentesis.  At this time waiting for safe disposition  Subjective: Seen and examined this morning  She is resting comfortably did not want to be bothered reports she has no nausea today She stated she worked with therapist yesterday walked in the hallway with assistance using front wheel walker  Assessment and plan:   Decompensated alcoholic cirrhosis Hepatic encephalopathy Recurrent ascites Alcohol  abuse: Suspected etiology has alcohol  use no other clear etiology.  EF 70 to 75% on echo 10/10/2023.  Patient was seen by PCCM GI nephrology surgery this admission. intermittently every 1 to 2 weeks was requiring paracentesis from IR-no ascites on ultrasound on 2/2 Continue current meds w/ Lasix  40mg , aldactone  100 mg and lactulose , midodrine  10 3 times daily multivitamin rifaximin .  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  Continue PPI twice daily  Deconditioning Debility: Walking with staff as tolerated, patient per administration cannot walk by herself due to hospital policy.Continue pain management on oxy prn.   Chronic hypotension: In the setting of liver cirrhosis HRS physiology.  Well-controlled on midodrine  tid   Hypokalemia Hypomagnesemia: Electrolytes stable. Cont Mag-Ox and k dur   Chronic hyponatremia: In the setting of cirrhosis, continue fluid restriction as ordered.Overall indication of poor prognosis   Cold sores: Improved with acyclovir    AKI: Likely from contrast, resolved. Monitor   Appendicitis: Much earlier in the admission.  Treated supportively with antibiotics.  Resolved.   Chronic pain: Continue oxy prn, along with gabapentin , Atarax  as needed and Seroquel  nightly    Severe malnutrition:  Augment nutritional status  Nutrition Problem: Severe Malnutrition Etiology: chronic illness (cirrhosis) Signs/Symptoms: severe fat depletion, severe muscle depletion Interventions: Ensure Enlive (each supplement provides 350kcal and 20 grams of protein), MVI, Liberalize Diet, Magic cup Current Body mass index is 22.82 kg/m.:  Mobility: PT Orders:  PT Follow up Rec: Recommend Snf, Barriers To Snf Placement - Toc To F/U With Patient/Family For D/C Plans (If Family/Friends Are Able To Coordinate 24/7 Support, Pt Would Be Able To Return Home With Hh Vs. Op Pt For Continued Balance Training)01/27/2024 1439   DVT prophylaxis: enoxaparin  (LOVENOX ) injection 30 mg Start: 02/07/24 1200 SCDs Start: 10/03/23 2153 Code Status:   Code Status: Full Code Family Communication: plan of care discussed with patient at bedside. Patient status is: Remains hospitalized because of severity of illness Level of care: Med-Surg   Dispo: The patient is from: home-reports she was renting a room at somebody's house and she cannot go back until she is able to walk by herself  Anticipated disposition: Waiting for safe disposition  Objective: Vitals last 24 hrs: Vitals:   02/12/24 2058 02/13/24 0037 02/13/24 0318 02/13/24 0756  BP: 130/73 122/67 128/76 120/77  Pulse: 83 98 99 96  Resp: 18 18 18 18   Temp: 98 F (36.7 C) 98.2 F (36.8 C) 98.6 F (37 C) 98.2 F (36.8 C)  TempSrc: Oral Oral Oral   SpO2: 100% 98% 98% 100%  Weight:      Height:       Physical Examination: General exam: alert awake, oriented.  Pleasant HEENT:Oral mucosa moist, Ear/Nose WNL grossly. Respiratory system: Bilaterally clear BS,no use of accessory muscle. Cardiovascular system: S1 & S2 +, No JVD. Gastrointestinal system: Abdomen soft-with moderate distention mild tenderness Nervous System: Alert, awake, moving all extremities,and following commands. Extremities: extremities warm, leg edema neg. Skin: Warm, no rashes. MSK: Normal muscle bulk,tone, power.   Medications reviewed:  Scheduled Meds:  sodium chloride    Intravenous Once   calcium  carbonate  1 tablet Oral TID WC   enoxaparin  (LOVENOX ) injection  30 mg Subcutaneous Q24H   feeding supplement  237 mL Oral TID BM   folic acid   1 mg Oral Daily   furosemide   40 mg Oral Daily   gabapentin   600 mg Oral TID   lactulose   30 g Oral TID   lidocaine   2 patch Transdermal Q24H   lidocaine -EPINEPHrine   20 mL Intradermal Once   magnesium  oxide  400 mg Oral BID   midodrine   10 mg Oral TID WC   multivitamin with minerals  1 tablet Oral Daily   mupirocin  ointment  1 Application Topical BID   pantoprazole   40 mg Oral BID   potassium chloride   20 mEq Oral Daily   QUEtiapine   25 mg Oral QHS   rifaximin   550 mg Oral BID   spironolactone   100 mg Oral Daily   thiamine   100 mg Oral Daily   triamcinolone  0.1 % cream : eucerin   Topical Daily   Continuous Infusions:  albumin  human     Diet: Diet Order             Diet 2 gram sodium Room service appropriate? Yes with Assist; Fluid consistency: Thin; Fluid restriction: 1500 mL Fluid   Diet effective now                    Unresulted Labs (From admission, onward)    None      Data Reviewed: I have personally reviewed following labs and imaging studies ( see epic result tab) CBC: Recent Labs  Lab 02/08/24 0232  WBC 3.5*  NEUTROABS 1.9  HGB 8.5*  HCT 26.6*  MCV 84.4  PLT 168   CMP: Recent Labs  Lab 02/08/24 0232  NA 131*  K 4.2  CL 98  CO2 23  GLUCOSE 96  BUN 17  CREATININE 0.59  CALCIUM  8.8*  MG 1.7  PHOS 4.5   GFR: Estimated Creatinine Clearance: 63.6 mL/min (by C-G formula based on SCr of 0.59 mg/dL). Recent Labs  Lab 02/08/24 0232  AST 47*  ALT 21  ALKPHOS 105  BILITOT 0.6  PROT 7.5  ALBUMIN  2.9*   No results for input(s): LIPASE, AMYLASE in the last 168 hours. No results for input(s): AMMONIA in the last 168 hours. Coagulation Profile: No results for input(s): INR, PROTIME in the last 168 hours. Antimicrobials/Microbiology: Anti-infectives (From admission, onward)    Start     Dose/Rate Route Frequency Ordered Stop   02/04/24  1115  rifaximin  (XIFAXAN ) tablet 550 mg        550 mg Oral 2 times daily 02/04/24 1025     12/06/23 1930  fluconazole  (DIFLUCAN ) tablet 150 mg        150 mg Oral  Once 12/06/23 1831 12/06/23 1905   11/21/23 1115  sulfamethoxazole -trimethoprim  (BACTRIM  DS) 800-160 MG per tablet 1 tablet        1 tablet Oral Every 12 hours 11/21/23 1024 11/23/23 2117   11/20/23 1400  cefTRIAXone  (ROCEPHIN ) 1 g in sodium chloride  0.9 % 100 mL IVPB  Status:  Discontinued        1 g 200 mL/hr over 30 Minutes Intravenous Every 24 hours 11/20/23 1245 11/21/23 1024   11/12/23 1145  rifaximin  (XIFAXAN ) tablet 550 mg  Status:  Discontinued        550 mg Oral 2 times daily 11/12/23 1058 11/22/23 1628   10/29/23 0000  rifaximin  (XIFAXAN ) 550 MG TABS tablet        550 mg Oral 2 times daily 10/29/23 1129     10/25/23 1415  rifaximin  (XIFAXAN ) tablet 550 mg  Status:  Discontinued        550 mg Oral 2 times daily 10/25/23  1315 11/10/23 1244   10/12/23 1000  metroNIDAZOLE  (FLAGYL ) tablet 500 mg        500 mg Oral Every 12 hours 10/12/23 0734 10/22/23 2125   10/12/23 0830  cefTRIAXone  (ROCEPHIN ) 2 g in sodium chloride  0.9 % 100 mL IVPB        2 g 200 mL/hr over 30 Minutes Intravenous Every 24 hours 10/12/23 0734 10/22/23 1636   10/07/23 1000  fluconazole  (DIFLUCAN ) tablet 100 mg  Status:  Discontinued        100 mg Oral Daily 10/07/23 0853 10/07/23 0855   10/07/23 1000  fluconazole  (DIFLUCAN ) tablet 150 mg        150 mg Oral Daily 10/07/23 0855 10/07/23 1014   10/04/23 1000  cefTRIAXone  (ROCEPHIN ) 2 g in sodium chloride  0.9 % 100 mL IVPB        2 g 200 mL/hr over 30 Minutes Intravenous Every 24 hours 10/03/23 2154 10/08/23 0854   10/03/23 2015  cefTRIAXone  (ROCEPHIN ) 1 g in sodium chloride  0.9 % 100 mL IVPB        1 g 200 mL/hr over 30 Minutes Intravenous  Once 10/03/23 2010 10/03/23 2212         Component Value Date/Time   SDES URINE, RANDOM 11/19/2023 1038   SPECREQUEST  11/19/2023 1038    NONE Performed at [facility], [address], [place], [name] [address]    CULT >=100,000 COLONIES/mL ENTEROBACTER CLOACAE (A) 11/19/2023 1038   REPTSTATUS 11/21/2023 FINAL 11/19/2023 1038    Procedures: Procedures (LRB): EGD (ESOPHAGOGASTRODUODENOSCOPY) (N/A)   Mennie LAMY, MD [facility] 02/13/2024, 11:07 AM   "

## 2024-02-13 NOTE — Plan of Care (Signed)
  Problem: Activity: Goal: Risk for activity intolerance will decrease Outcome: Progressing   Problem: Coping: Goal: Level of anxiety will decrease Outcome: Progressing   Problem: Pain Managment: Goal: General experience of comfort will improve and/or be controlled Outcome: Progressing   Problem: Safety: Goal: Ability to remain free from injury will improve Outcome: Progressing

## 2024-02-13 NOTE — TOC Progression Note (Signed)
 Transition of Care [facility]) - Progression Note    Patient Details  Name: GAYLENE MOYLAN MRN: [id number] Date of Birth: 08-15-1970  Transition of Care [facility]) CM/SW Contact  [name], [name] Phone Number: 02/13/2024, 3:46 PM  Clinical Narrative:   CSW spoke with Monique with Disability to discuss request for records. Monique sent request along with release, and CSW faxed over records for Disability Determination. CSW updated patient that records were being faxed, and that Monique needed to speak with the patient for a phone assessment. Information provided to patient, she said she would call. CSW to follow.    Expected Discharge Plan: Skilled Nursing Facility Barriers to Discharge: Homeless with medical needs, Inadequate or no insurance, Continued Medical Work up, Unsafe home situation               Expected Discharge Plan and Services In-house Referral: Clinical Social Work Discharge Planning Services: CM Consult Post Acute Care Choice: Home Health Living arrangements for the past 2 months: Single Family Home                 DME Arranged: Walker rolling   Date DME Agency Contacted: 10/08/23   Representative spoke with at DME Agency: [name]             Social Drivers of Health (SDOH) Interventions SDOH Screenings   Food Insecurity: No Food Insecurity (10/03/2023)  Housing: Low Risk (10/03/2023)  Transportation Needs: No Transportation Needs (10/03/2023)  Recent Concern: Transportation Needs - Unmet Transportation Needs (08/20/2023)  Utilities: Not At Risk (10/03/2023)  Depression (PHQ2-9): High Risk (07/30/2023)  Social Connections: Socially Isolated (10/03/2023)  Tobacco Use: High Risk (10/04/2023)    Readmission Risk Interventions    10/04/2023    9:02 AM 09/03/2023   12:31 PM 09/02/2023    9:01 AM  Readmission Risk Prevention Plan  Transportation Screening Complete Complete Complete  PCP or Specialist Appt within 3-5 Days   Complete  HRI or Home Care Consult    Complete  Social Work Consult for Recovery Care Planning/Counseling   Complete  Palliative Care Screening   Not Applicable  Medication Review Oceanographer) Complete Complete Complete  HRI or Home Care Consult Complete Complete   SW Recovery Care/Counseling Consult Complete Complete   Palliative Care Screening Not Applicable Not Applicable   Skilled Nursing Facility Not Applicable Patient Refused

## 2024-02-14 NOTE — Plan of Care (Signed)
  Problem: Clinical Measurements: Goal: Diagnostic test results will improve Outcome: Progressing Goal: Respiratory complications will improve Outcome: Progressing Goal: Cardiovascular complication will be avoided Outcome: Progressing   

## 2024-02-14 NOTE — Plan of Care (Signed)
  Problem: Clinical Measurements: Goal: Diagnostic test results will improve Outcome: Progressing Goal: Respiratory complications will improve Outcome: Progressing Goal: Cardiovascular complication will be avoided Outcome: Progressing   Problem: Activity: Goal: Risk for activity intolerance will decrease Outcome: Progressing   Problem: Nutrition: Goal: Adequate nutrition will be maintained Outcome: Progressing   Problem: Coping: Goal: Level of anxiety will decrease Outcome: Progressing   Problem: Elimination: Goal: Will not experience complications related to bowel motility Outcome: Progressing Goal: Will not experience complications related to urinary retention Outcome: Progressing   Problem: Pain Managment: Goal: General experience of comfort will improve and/or be controlled Outcome: Progressing   Problem: Safety: Goal: Ability to remain free from injury will improve Outcome: Progressing   Problem: Skin Integrity: Goal: Risk for impaired skin integrity will decrease Outcome: Progressing

## 2024-02-14 NOTE — Progress Notes (Signed)
 Patient able to take several pills with only 30ml of water and would prefer meds to be given with this amount in order ot try and maintain fluid restrictions.

## 2024-02-14 NOTE — Progress Notes (Signed)
 " PROGRESS NOTE Joan Wood  FMW:[id number] DOB: 08/12/1970 DOA: 10/03/2023 PCP: Edman Meade PEDLAR, FNP  Brief Narrative/Hospital Course: Joan Wood is a 54 y.o. female with PMH of history of decompensated liver cirrhosis, PUD, gastric ulcer perforation requiring exploratory laparotomy, GERD, pancreatitis, OSA admitted initially for coffee-ground emesis and bloody diarrhea.  Consulted GI underwent endoscopy which revealed nonbleeding ulcer and esophagitis and some concerns of esophageal necrosis.  This was treated by GI team and IR had also been consulted.  Hospital course also complicated by hepatic encephalopathy requiring lactulose  and rifaximin .  Intermittently that she requires paracentesis.  At this time waiting for safe disposition  Subjective: Seen and examined Patient bit frustrated about fluid restriction Abdomen remains bloated no nausea vomiting fever chills this morning Overnight vital stable afebrile on room  Assessment and plan:   Decompensated alcoholic cirrhosis Hepatic encephalopathy Recurrent ascites Alcohol  abuse: Suspected etiology has alcohol  use no other clear etiology.  EF 70 to 75% on echo 10/10/2023.  Patient was seen by PCCM GI nephrology surgery this admission.  On paracentesis intermittently  prn 1-2 WEEKLY-no ascites on ultrasound on 2/2.  She wants to REMOVE fluid restriction Continue current meds w/ Lasix  40mg , aldactone  100 mg and lactulose , midodrine  10 3 times daily multivitamin rifaximin .  Continue thiamine  folate MVI  Coffee-ground emesis with nausea vomiting abdominal pain and history of peptic ulcer disease with known recent gastric ulcer perforation: On admission patient had coffee-ground emesis in September underwent EGD-egd on sept 2025- LA grade D esophagitis without bleeding portal hypertensive gastropathy, nonbleeding gastric ulcer, black discoloration of mucosa in the esophagus concerning for acute esophageal necrosis AVM.  Continue PPI twice  daily  Deconditioning Debility: Walking with staff as tolerated, patient per administration cannot walk by herself due to hospital policy.Continue pain management on oxy prn.   Chronic hypotension: In the setting of liver cirrhosis HRS physiology.  Well-controlled on midodrine  tid   Hypokalemia Hypomagnesemia: Electrolytes stable. Cont Mag-Ox and k dur   Chronic hyponatremia: In the setting of cirrhosis, continue fluid restriction as ordered.Overall indication of poor prognosis   Cold sores: Improved with acyclovir    AKI: Likely from contrast, resolved. Monitor   Appendicitis: Much earlier in the admission.  Treated supportively with antibiotics.  Resolved.   Chronic pain: Continue oxy prn, along with gabapentin , Atarax  as needed and Seroquel  nightly    Severe malnutrition:  Augment nutritional status  Nutrition Problem: Severe Malnutrition Etiology: chronic illness (cirrhosis) Signs/Symptoms: severe fat depletion, severe muscle depletion Interventions: Ensure Enlive (each supplement provides 350kcal and 20 grams of protein), MVI, Liberalize Diet, Magic cup Current Body mass index is 22.82 kg/m.:  Mobility: PT Orders:  PT Follow up Rec: Recommend Snf, Barriers To Snf Placement - Toc To F/U With Patient/Family For D/C Plans (If Family/Friends Are Able To Coordinate 24/7 Support, Pt Would Be Able To Return Home With Hh Vs. Op Pt For Continued Balance Training)01/27/2024 1439   DVT prophylaxis: enoxaparin  (LOVENOX ) injection 30 mg Start: 02/07/24 1200 SCDs Start: 10/03/23 2153 Code Status:   Code Status: Full Code Family Communication: plan of care discussed with patient at bedside. Patient status is: Remains hospitalized because of severity of illness Level of care: Med-Surg   Dispo: The patient is from: home-reports she was renting a room at somebody's house and she cannot go back until she is able to walk by herself            Anticipated disposition: Waiting for safe  disposition  Objective: Vitals  last 24 hrs: Vitals:   02/12/24 2058 02/13/24 0037 02/13/24 0318 02/13/24 0756  BP: 130/73 122/67 128/76 120/77  Pulse: 83 98 99 96  Resp: 18 18 18 18   Temp: 98 F (36.7 C) 98.2 F (36.8 C) 98.6 F (37 C) 98.2 F (36.8 C)  TempSrc: Oral Oral Oral   SpO2: 100% 98% 98% 100%  Weight:      Height:       Physical Examination: General exam: alert awake HEENT:Oral mucosa moist, Ear/Nose WNL grossly. Respiratory system: Bilaterally clear Cardiovascular system: S1 & S2 +, No JVD. Gastrointestinal system: Abdomen soft-with moderate distention no significant tenderness Nervous System: Alert, awake, moving all extremities,and following commands. Extremities: extremities warm, leg edema neg. Skin: Warm, no rashes. MSK: Normal muscle bulk,tone, power.   Medications reviewed:  Scheduled Meds:  sodium chloride    Intravenous Once   calcium  carbonate  1 tablet Oral TID WC   enoxaparin  (LOVENOX ) injection  30 mg Subcutaneous Q24H   feeding supplement  237 mL Oral TID BM   folic acid   1 mg Oral Daily   furosemide   40 mg Oral Daily   gabapentin   600 mg Oral TID   lactulose   30 g Oral TID   lidocaine   2 patch Transdermal Q24H   lidocaine -EPINEPHrine   20 mL Intradermal Once   magnesium  oxide  400 mg Oral BID   midodrine   10 mg Oral TID WC   multivitamin with minerals  1 tablet Oral Daily   mupirocin  ointment  1 Application Topical BID   pantoprazole   40 mg Oral BID   potassium chloride   20 mEq Oral Daily   QUEtiapine   25 mg Oral QHS   rifaximin   550 mg Oral BID   spironolactone   100 mg Oral Daily   thiamine   100 mg Oral Daily   triamcinolone  0.1 % cream : eucerin   Topical Daily   Continuous Infusions:  albumin  human     Diet: Diet Order             Diet 2 gram sodium Room service appropriate? Yes with Assist; Fluid consistency: Thin; Fluid restriction: 1500 mL Fluid  Diet effective now                    Unresulted Labs (From admission,  onward)    None      Data Reviewed: I have personally reviewed following labs and imaging studies ( see epic result tab) CBC: Recent Labs  Lab 02/08/24 0232  WBC 3.5*  NEUTROABS 1.9  HGB 8.5*  HCT 26.6*  MCV 84.4  PLT 168   CMP: Recent Labs  Lab 02/08/24 0232  NA 131*  K 4.2  CL 98  CO2 23  GLUCOSE 96  BUN 17  CREATININE 0.59  CALCIUM  8.8*  MG 1.7  PHOS 4.5   GFR: Estimated Creatinine Clearance: 63.6 mL/min (by C-G formula based on SCr of 0.59 mg/dL). Recent Labs  Lab 02/08/24 0232  AST 47*  ALT 21  ALKPHOS 105  BILITOT 0.6  PROT 7.5  ALBUMIN  2.9*   No results for input(s): LIPASE, AMYLASE in the last 168 hours. No results for input(s): AMMONIA in the last 168 hours. Coagulation Profile: No results for input(s): INR, PROTIME in the last 168 hours. Antimicrobials/Microbiology: Anti-infectives (From admission, onward)    Start     Dose/Rate Route Frequency Ordered Stop   02/04/24 1115  rifaximin  (XIFAXAN ) tablet 550 mg        550 mg  Oral 2 times daily 02/04/24 1025     12/06/23 1930  fluconazole  (DIFLUCAN ) tablet 150 mg        150 mg Oral  Once 12/06/23 1831 12/06/23 1905   11/21/23 1115  sulfamethoxazole -trimethoprim  (BACTRIM  DS) 800-160 MG per tablet 1 tablet        1 tablet Oral Every 12 hours 11/21/23 1024 11/23/23 2117   11/20/23 1400  cefTRIAXone  (ROCEPHIN ) 1 g in sodium chloride  0.9 % 100 mL IVPB  Status:  Discontinued        1 g 200 mL/hr over 30 Minutes Intravenous Every 24 hours 11/20/23 1245 11/21/23 1024   11/12/23 1145  rifaximin  (XIFAXAN ) tablet 550 mg  Status:  Discontinued        550 mg Oral 2 times daily 11/12/23 1058 11/22/23 1628   10/29/23 0000  rifaximin  (XIFAXAN ) 550 MG TABS tablet        550 mg Oral 2 times daily 10/29/23 1129     10/25/23 1415  rifaximin  (XIFAXAN ) tablet 550 mg  Status:  Discontinued        550 mg Oral 2 times daily 10/25/23 1315 11/10/23 1244   10/12/23 1000  metroNIDAZOLE  (FLAGYL ) tablet 500 mg         500 mg Oral Every 12 hours 10/12/23 0734 10/22/23 2125   10/12/23 0830  cefTRIAXone  (ROCEPHIN ) 2 g in sodium chloride  0.9 % 100 mL IVPB        2 g 200 mL/hr over 30 Minutes Intravenous Every 24 hours 10/12/23 0734 10/22/23 1636   10/07/23 1000  fluconazole  (DIFLUCAN ) tablet 100 mg  Status:  Discontinued        100 mg Oral Daily 10/07/23 0853 10/07/23 0855   10/07/23 1000  fluconazole  (DIFLUCAN ) tablet 150 mg        150 mg Oral Daily 10/07/23 0855 10/07/23 1014   10/04/23 1000  cefTRIAXone  (ROCEPHIN ) 2 g in sodium chloride  0.9 % 100 mL IVPB        2 g 200 mL/hr over 30 Minutes Intravenous Every 24 hours 10/03/23 2154 10/08/23 0854   10/03/23 2015  cefTRIAXone  (ROCEPHIN ) 1 g in sodium chloride  0.9 % 100 mL IVPB        1 g 200 mL/hr over 30 Minutes Intravenous  Once 10/03/23 2010 10/03/23 2212         Component Value Date/Time   SDES URINE, RANDOM 11/19/2023 1038   SPECREQUEST  11/19/2023 1038    NONE Performed at [facility], [address], [place], [name] [address]    CULT >=100,000 COLONIES/mL ENTEROBACTER CLOACAE (A) 11/19/2023 1038   REPTSTATUS 11/21/2023 FINAL 11/19/2023 1038    Procedures: Procedures (LRB): EGD (ESOPHAGOGASTRODUODENOSCOPY) (N/A)   Mennie LAMY, MD [facility] 02/14/2024, 11:33 AM   "

## 2024-02-15 NOTE — Plan of Care (Signed)
   Problem: Coping: Goal: Level of anxiety will decrease Outcome: Progressing   Problem: Pain Managment: Goal: General experience of comfort will improve and/or be controlled Outcome: Progressing   Problem: Safety: Goal: Ability to remain free from injury will improve Outcome: Progressing

## 2024-02-15 NOTE — Progress Notes (Signed)
 " PROGRESS NOTE Joan Wood  FMW:[id number] DOB: 05/18/1970 DOA: 10/03/2023 PCP: Edman Meade PEDLAR, FNP  Brief Narrative/Hospital Course: Joan Wood is a 54 y.o. female with PMH of history of decompensated liver cirrhosis, PUD, gastric ulcer perforation requiring exploratory laparotomy, GERD, pancreatitis, OSA admitted initially for coffee-ground emesis and bloody diarrhea.  Consulted GI underwent endoscopy which revealed nonbleeding ulcer and esophagitis and some concerns of esophageal necrosis.  This was treated by GI team and IR had also been consulted.  Hospital course also complicated by hepatic encephalopathy requiring lactulose  and rifaximin .  Intermittently that she requires paracentesis.  At this time waiting for safe disposition  Subjective: Seen and examined She has no new complaints resting comfortably reports she just took her meds Overnight vital stable afebrile on room abdomen remains similarly distended but no significant tenderness or worsening  Assessment and plan:   Decompensated alcoholic cirrhosis Hepatic encephalopathy Recurrent ascites Alcohol  abuse: Suspected etiology has alcohol  use no other clear etiology.  EF 70 to 75% on echo 10/10/2023.  Patient was seen by PCCM GI nephrology surgery this admission.  On paracentesis intermittently  prn 1-2 WEEKLY-no ascites on ultrasound on 02/11/24> recheck in a week Continue current meds w/ Lasix  40mg , aldactone  100 mg and lactulose , midodrine  10 tid, MVI,rifaximin ,thiamine  folate  Coffee-ground emesis with nausea vomiting abdominal pain and history of peptic ulcer disease with known recent gastric ulcer perforation: On admission patient had coffee-ground emesis in September underwent EGD-egd on sept 2025- LA grade D esophagitis without bleeding portal hypertensive gastropathy, nonbleeding gastric ulcer, black discoloration of mucosa in the esophagus concerning for acute esophageal necrosis AVM.  Continue PPI twice  daily  Deconditioning Debility: Walking with staff as tolerated, patient per administration cannot walk by herself due to hospital policy.Continue pain management on oxy prn.   Chronic hypotension: In the setting of liver cirrhosis HRS physiology.  Well-controlled on midodrine  tid   Hypokalemia Hypomagnesemia: Electrolytes stable. Cont Mag-Ox and k dur   Chronic hyponatremia: In the setting of cirrhosis, continue fluid restriction as ordered.Overall indication of poor prognosis   Cold sores: Improved with acyclovir    AKI: Likely from contrast, resolved. Monitor   Appendicitis: Much earlier in the admission.  Treated supportively with antibiotics.  Resolved.   Chronic pain: Continue oxy prn, along with gabapentin , Atarax  as needed and Seroquel  nightly    Severe malnutrition:  Augment nutritional status  Nutrition Problem: Severe Malnutrition Etiology: chronic illness (cirrhosis) Signs/Symptoms: severe fat depletion, severe muscle depletion Interventions: Ensure Enlive (each supplement provides 350kcal and 20 grams of protein), MVI, Liberalize Diet, Magic cup Current Body mass index is 22.62 kg/m.:  Mobility: PT Orders:  PT Follow up Rec: Recommend Snf, Barriers To Snf Placement - Toc To F/U With Patient/Family For D/C Plans (If Family/Friends Are Able To Coordinate 24/7 Support, Pt Would Be Able To Return Home With Hh Vs. Op Pt For Continued Balance Training)01/27/2024 1439   DVT prophylaxis: enoxaparin  (LOVENOX ) injection 30 mg Start: 02/07/24 1200 SCDs Start: 10/03/23 2153 Code Status:   Code Status: Full Code Family Communication: plan of care discussed with patient at bedside. Patient status is: Remains hospitalized because of severity of illness Level of care: Med-Surg   Dispo: The patient is from: home-reports she was renting a room at somebody's house and she cannot go back until she is able to walk by herself            Anticipated disposition: Waiting for safe  disposition  Objective: Vitals last 24 hrs:  Vitals:   02/14/24 0517 02/14/24 0735 02/15/24 0002 02/15/24 0824  BP:  116/63 112/69 (!) 97/59  Pulse:  97 99 91  Resp:  16 16 18   Temp:  98.5 F (36.9 C) 99 F (37.2 C) 99 F (37.2 C)  TempSrc:  Oral Oral Oral  SpO2:  97% 98% 100%  Weight: 56.1 kg     Height:       Physical Examination: General exam: alert awake oriented and pleasant HEENT:Oral mucosa moist, Ear/Nose WNL grossly. Respiratory system: Bilaterally clear Cardiovascular system: S1 & S2 +, No JVD. Gastrointestinal system: Abdomen soft-with mod distention without significant tenderness Nervous System: Alert, awake, moving all extremities,and following commands. Extremities: extremities warm, leg edema neg. Skin: Warm, no rashes. MSK: Normal muscle bulk,tone, power.   Medications reviewed:  Scheduled Meds:  sodium chloride    Intravenous Once   calcium  carbonate  1 tablet Oral TID WC   enoxaparin  (LOVENOX ) injection  30 mg Subcutaneous Q24H   folic acid   1 mg Oral Daily   furosemide   40 mg Oral Daily   gabapentin   600 mg Oral TID   lactulose   30 g Oral TID   lidocaine   2 patch Transdermal Q24H   lidocaine -EPINEPHrine   20 mL Intradermal Once   magnesium  oxide  400 mg Oral BID   midodrine   10 mg Oral TID WC   multivitamin with minerals  1 tablet Oral Daily   mupirocin  ointment  1 Application Topical BID   pantoprazole   40 mg Oral BID   potassium chloride   20 mEq Oral Daily   QUEtiapine   25 mg Oral QHS   rifaximin   550 mg Oral BID   spironolactone   100 mg Oral Daily   thiamine   100 mg Oral Daily   triamcinolone  0.1 % cream : eucerin   Topical Daily   Continuous Infusions:  albumin  human Stopped (02/13/24 0700)   Diet: Diet Order             Diet 2 gram sodium Room service appropriate? Yes with Assist; Fluid consistency: Thin; Fluid restriction: 1500 mL Fluid  Diet effective now                    Unresulted Labs (From admission, onward)    None       Data Reviewed: I have personally reviewed following labs and imaging studies ( see epic result tab) CBC: No results for input(s): WBC, NEUTROABS, HGB, HCT, MCV, PLT in the last 168 hours.  CMP: No results for input(s): NA, K, CL, CO2, GLUCOSE, BUN, CREATININE, CALCIUM , MG, PHOS in the last 168 hours.  GFR: Estimated Creatinine Clearance: 63.6 mL/min (by C-G formula based on SCr of 0.59 mg/dL). No results for input(s): AST, ALT, ALKPHOS, BILITOT, PROT, ALBUMIN  in the last 168 hours.  No results for input(s): LIPASE, AMYLASE in the last 168 hours. No results for input(s): AMMONIA in the last 168 hours. Coagulation Profile: No results for input(s): INR, PROTIME in the last 168 hours. Antimicrobials/Microbiology: Anti-infectives (From admission, onward)    Start     Dose/Rate Route Frequency Ordered Stop   02/04/24 1115  rifaximin  (XIFAXAN ) tablet 550 mg        550 mg Oral 2 times daily 02/04/24 1025     12/06/23 1930  fluconazole  (DIFLUCAN ) tablet 150 mg        150 mg Oral  Once 12/06/23 1831 12/06/23 1905   11/21/23 1115  sulfamethoxazole -trimethoprim  (BACTRIM  DS) 800-160 MG per tablet 1 tablet  1 tablet Oral Every 12 hours 11/21/23 1024 11/23/23 2117   11/20/23 1400  cefTRIAXone  (ROCEPHIN ) 1 g in sodium chloride  0.9 % 100 mL IVPB  Status:  Discontinued        1 g 200 mL/hr over 30 Minutes Intravenous Every 24 hours 11/20/23 1245 11/21/23 1024   11/12/23 1145  rifaximin  (XIFAXAN ) tablet 550 mg  Status:  Discontinued        550 mg Oral 2 times daily 11/12/23 1058 11/22/23 1628   10/29/23 0000  rifaximin  (XIFAXAN ) 550 MG TABS tablet        550 mg Oral 2 times daily 10/29/23 1129     10/25/23 1415  rifaximin  (XIFAXAN ) tablet 550 mg  Status:  Discontinued        550 mg Oral 2 times daily 10/25/23 1315 11/10/23 1244   10/12/23 1000  metroNIDAZOLE  (FLAGYL ) tablet 500 mg        500 mg Oral Every 12 hours 10/12/23 0734 10/22/23 2125    10/12/23 0830  cefTRIAXone  (ROCEPHIN ) 2 g in sodium chloride  0.9 % 100 mL IVPB        2 g 200 mL/hr over 30 Minutes Intravenous Every 24 hours 10/12/23 0734 10/22/23 1636   10/07/23 1000  fluconazole  (DIFLUCAN ) tablet 100 mg  Status:  Discontinued        100 mg Oral Daily 10/07/23 0853 10/07/23 0855   10/07/23 1000  fluconazole  (DIFLUCAN ) tablet 150 mg        150 mg Oral Daily 10/07/23 0855 10/07/23 1014   10/04/23 1000  cefTRIAXone  (ROCEPHIN ) 2 g in sodium chloride  0.9 % 100 mL IVPB        2 g 200 mL/hr over 30 Minutes Intravenous Every 24 hours 10/03/23 2154 10/08/23 0854   10/03/23 2015  cefTRIAXone  (ROCEPHIN ) 1 g in sodium chloride  0.9 % 100 mL IVPB        1 g 200 mL/hr over 30 Minutes Intravenous  Once 10/03/23 2010 10/03/23 2212         Component Value Date/Time   SDES URINE, RANDOM 11/19/2023 1038   SPECREQUEST  11/19/2023 1038    NONE Performed at [facility], [address] [place], [name] [address]    CULT >=100,000 COLONIES/mL ENTEROBACTER CLOACAE (A) 11/19/2023 1038   REPTSTATUS 11/21/2023 FINAL 11/19/2023 1038    Procedures: Procedures (LRB): EGD (ESOPHAGOGASTRODUODENOSCOPY) (N/A)   Mennie LAMY, MD [facility] 02/15/2024, 11:24 AM   "
